# Patient Record
Sex: Male | Born: 1940
Health system: Southern US, Community
[De-identification: ages and names within clinical notes are randomized; demographics above are authoritative.]

## PROBLEM LIST (undated history)

## (undated) DIAGNOSIS — I4891 Unspecified atrial fibrillation: Secondary | ICD-10-CM

## (undated) DIAGNOSIS — I10 Essential (primary) hypertension: Secondary | ICD-10-CM

## (undated) DIAGNOSIS — K5909 Other constipation: Secondary | ICD-10-CM

## (undated) DIAGNOSIS — G4733 Obstructive sleep apnea (adult) (pediatric): Secondary | ICD-10-CM

## (undated) DIAGNOSIS — Z95810 Presence of automatic (implantable) cardiac defibrillator: Secondary | ICD-10-CM

## (undated) DIAGNOSIS — J310 Chronic rhinitis: Secondary | ICD-10-CM

## (undated) DIAGNOSIS — Z950 Presence of cardiac pacemaker: Secondary | ICD-10-CM

## (undated) DIAGNOSIS — K5730 Diverticulosis of large intestine without perforation or abscess without bleeding: Secondary | ICD-10-CM

## (undated) DIAGNOSIS — C44529 Squamous cell carcinoma of skin of other part of trunk: Secondary | ICD-10-CM

## (undated) DIAGNOSIS — R3129 Other microscopic hematuria: Secondary | ICD-10-CM

## (undated) DIAGNOSIS — I2510 Atherosclerotic heart disease of native coronary artery without angina pectoris: Secondary | ICD-10-CM

## (undated) DIAGNOSIS — N400 Enlarged prostate without lower urinary tract symptoms: Secondary | ICD-10-CM

## (undated) DIAGNOSIS — J449 Chronic obstructive pulmonary disease, unspecified: Secondary | ICD-10-CM

## (undated) DIAGNOSIS — K219 Gastro-esophageal reflux disease without esophagitis: Secondary | ICD-10-CM

## (undated) DIAGNOSIS — I509 Heart failure, unspecified: Secondary | ICD-10-CM

## (undated) DIAGNOSIS — K760 Fatty (change of) liver, not elsewhere classified: Secondary | ICD-10-CM

## (undated) DIAGNOSIS — I255 Ischemic cardiomyopathy: Secondary | ICD-10-CM

## (undated) DIAGNOSIS — I472 Ventricular tachycardia, unspecified: Secondary | ICD-10-CM

## (undated) DIAGNOSIS — H903 Sensorineural hearing loss, bilateral: Secondary | ICD-10-CM

## (undated) DIAGNOSIS — K8020 Calculus of gallbladder without cholecystitis without obstruction: Secondary | ICD-10-CM

## (undated) DIAGNOSIS — D126 Benign neoplasm of colon, unspecified: Secondary | ICD-10-CM

## (undated) DIAGNOSIS — N200 Calculus of kidney: Secondary | ICD-10-CM

## (undated) DIAGNOSIS — E785 Hyperlipidemia, unspecified: Secondary | ICD-10-CM

## (undated) HISTORY — PX: ATRIAL ABLATION SURGERY: SHX560

## (undated) HISTORY — DX: Chronic rhinitis: J31.0

## (undated) HISTORY — PX: CARDIAC CATHETERIZATION: SHX172

## (undated) HISTORY — DX: Fatty (change of) liver, not elsewhere classified: K76.0

## (undated) HISTORY — DX: Other microscopic hematuria: R31.29

## (undated) HISTORY — DX: Sensorineural hearing loss, bilateral: H90.3

## (undated) HISTORY — DX: Benign prostatic hyperplasia without lower urinary tract symptoms: N40.0

## (undated) HISTORY — DX: Hyperlipidemia, unspecified: E78.5

## (undated) HISTORY — DX: Ventricular tachycardia, unspecified: I47.20

## (undated) HISTORY — DX: Obstructive sleep apnea (adult) (pediatric): G47.33

## (undated) HISTORY — DX: Calculus of kidney: N20.0

## (undated) HISTORY — DX: Essential (primary) hypertension: I10

## (undated) HISTORY — PX: OTHER SURGICAL HISTORY: SHX169

## (undated) HISTORY — DX: Ischemic cardiomyopathy: I25.5

## (undated) HISTORY — DX: Gastro-esophageal reflux disease without esophagitis: K21.9

## (undated) HISTORY — PX: EXTRACORPOREAL SHOCK WAVE LITHOTRIPSY: SHX1557

## (undated) HISTORY — DX: Ventricular tachycardia: I47.2

## (undated) HISTORY — PX: HEMORRHOID BANDING: SHX5850

## (undated) HISTORY — DX: Diverticulosis of large intestine without perforation or abscess without bleeding: K57.30

## (undated) HISTORY — DX: Unspecified atrial fibrillation: I48.91

## (undated) HISTORY — DX: Benign neoplasm of colon, unspecified: D12.6

## (undated) HISTORY — DX: Atherosclerotic heart disease of native coronary artery without angina pectoris: I25.10

---

## 1973-01-14 HISTORY — PX: INGUINAL HERNIA REPAIR: SUR1180

## 1988-01-15 HISTORY — PX: BREAST SURGERY: SHX581

## 1997-06-07 ENCOUNTER — Ambulatory Visit (HOSPITAL_COMMUNITY): Admission: RE | Admit: 1997-06-07 | Discharge: 1997-06-07 | Payer: Self-pay | Admitting: Cardiology

## 1997-06-16 ENCOUNTER — Ambulatory Visit (HOSPITAL_COMMUNITY): Admission: RE | Admit: 1997-06-16 | Discharge: 1997-06-16 | Payer: Self-pay | Admitting: Cardiology

## 2000-01-31 ENCOUNTER — Ambulatory Visit (HOSPITAL_COMMUNITY): Admission: RE | Admit: 2000-01-31 | Discharge: 2000-01-31 | Payer: Self-pay | Admitting: Cardiology

## 2000-04-07 ENCOUNTER — Ambulatory Visit (HOSPITAL_COMMUNITY): Admission: RE | Admit: 2000-04-07 | Discharge: 2000-04-07 | Payer: Self-pay | Admitting: Cardiology

## 2000-04-07 ENCOUNTER — Encounter: Payer: Self-pay | Admitting: Cardiology

## 2000-06-03 ENCOUNTER — Ambulatory Visit (HOSPITAL_COMMUNITY): Admission: RE | Admit: 2000-06-03 | Discharge: 2000-06-03 | Payer: Self-pay | Admitting: Cardiology

## 2000-06-09 ENCOUNTER — Ambulatory Visit (HOSPITAL_COMMUNITY): Admission: RE | Admit: 2000-06-09 | Discharge: 2000-06-10 | Payer: Self-pay | Admitting: Internal Medicine

## 2000-06-10 ENCOUNTER — Encounter: Payer: Self-pay | Admitting: Internal Medicine

## 2000-09-13 ENCOUNTER — Emergency Department (HOSPITAL_COMMUNITY): Admission: EM | Admit: 2000-09-13 | Discharge: 2000-09-13 | Payer: Self-pay | Admitting: Emergency Medicine

## 2000-10-03 ENCOUNTER — Emergency Department (HOSPITAL_COMMUNITY): Admission: EM | Admit: 2000-10-03 | Discharge: 2000-10-03 | Payer: Self-pay

## 2000-12-12 ENCOUNTER — Ambulatory Visit (HOSPITAL_COMMUNITY): Admission: RE | Admit: 2000-12-12 | Discharge: 2000-12-12 | Payer: Self-pay | Admitting: Internal Medicine

## 2001-02-05 ENCOUNTER — Encounter (INDEPENDENT_AMBULATORY_CARE_PROVIDER_SITE_OTHER): Payer: Self-pay | Admitting: Specialist

## 2001-02-05 ENCOUNTER — Ambulatory Visit (HOSPITAL_COMMUNITY): Admission: RE | Admit: 2001-02-05 | Discharge: 2001-02-05 | Payer: Self-pay | Admitting: Gastroenterology

## 2001-08-03 ENCOUNTER — Encounter: Admission: RE | Admit: 2001-08-03 | Discharge: 2001-08-03 | Payer: Self-pay | Admitting: Cardiology

## 2001-08-03 ENCOUNTER — Encounter: Payer: Self-pay | Admitting: Cardiology

## 2003-01-15 DIAGNOSIS — K5730 Diverticulosis of large intestine without perforation or abscess without bleeding: Secondary | ICD-10-CM

## 2003-01-15 HISTORY — DX: Diverticulosis of large intestine without perforation or abscess without bleeding: K57.30

## 2003-06-01 ENCOUNTER — Encounter (INDEPENDENT_AMBULATORY_CARE_PROVIDER_SITE_OTHER): Payer: Self-pay | Admitting: Specialist

## 2003-06-01 ENCOUNTER — Ambulatory Visit (HOSPITAL_COMMUNITY): Admission: RE | Admit: 2003-06-01 | Discharge: 2003-06-01 | Payer: Self-pay | Admitting: Gastroenterology

## 2003-11-15 ENCOUNTER — Ambulatory Visit: Payer: Self-pay | Admitting: Cardiology

## 2003-11-16 ENCOUNTER — Ambulatory Visit: Payer: Self-pay | Admitting: Internal Medicine

## 2003-12-02 ENCOUNTER — Ambulatory Visit: Payer: Self-pay

## 2003-12-06 ENCOUNTER — Ambulatory Visit: Payer: Self-pay | Admitting: Internal Medicine

## 2003-12-15 ENCOUNTER — Ambulatory Visit: Payer: Self-pay | Admitting: Internal Medicine

## 2003-12-29 ENCOUNTER — Ambulatory Visit: Payer: Self-pay | Admitting: Cardiology

## 2004-01-02 ENCOUNTER — Ambulatory Visit: Payer: Self-pay

## 2004-01-18 ENCOUNTER — Ambulatory Visit: Payer: Self-pay | Admitting: Cardiology

## 2004-02-08 ENCOUNTER — Ambulatory Visit: Payer: Self-pay | Admitting: Cardiology

## 2004-02-23 ENCOUNTER — Ambulatory Visit: Payer: Self-pay | Admitting: Cardiology

## 2004-03-20 ENCOUNTER — Ambulatory Visit: Payer: Self-pay | Admitting: Cardiovascular Disease

## 2004-04-17 ENCOUNTER — Ambulatory Visit: Payer: Self-pay | Admitting: Cardiology

## 2004-05-03 ENCOUNTER — Ambulatory Visit: Payer: Self-pay

## 2004-05-03 ENCOUNTER — Ambulatory Visit: Payer: Self-pay | Admitting: Cardiology

## 2004-05-15 ENCOUNTER — Ambulatory Visit: Payer: Self-pay | Admitting: *Deleted

## 2004-06-12 ENCOUNTER — Ambulatory Visit: Payer: Self-pay | Admitting: *Deleted

## 2004-06-25 ENCOUNTER — Ambulatory Visit: Payer: Self-pay | Admitting: Internal Medicine

## 2004-07-02 ENCOUNTER — Inpatient Hospital Stay (HOSPITAL_COMMUNITY): Admission: EM | Admit: 2004-07-02 | Discharge: 2004-07-06 | Payer: Self-pay | Admitting: Emergency Medicine

## 2004-07-02 ENCOUNTER — Ambulatory Visit: Payer: Self-pay | Admitting: Internal Medicine

## 2004-07-09 ENCOUNTER — Ambulatory Visit: Payer: Self-pay | Admitting: Internal Medicine

## 2004-07-19 ENCOUNTER — Ambulatory Visit: Payer: Self-pay | Admitting: Internal Medicine

## 2004-08-07 ENCOUNTER — Ambulatory Visit: Payer: Self-pay | Admitting: Cardiology

## 2004-08-15 ENCOUNTER — Ambulatory Visit: Payer: Self-pay | Admitting: Cardiology

## 2004-08-17 ENCOUNTER — Ambulatory Visit: Payer: Self-pay | Admitting: Cardiology

## 2004-08-22 ENCOUNTER — Ambulatory Visit: Payer: Self-pay | Admitting: Cardiology

## 2004-08-30 ENCOUNTER — Ambulatory Visit: Payer: Self-pay | Admitting: Internal Medicine

## 2004-09-14 ENCOUNTER — Ambulatory Visit: Payer: Self-pay | Admitting: Cardiology

## 2004-10-01 ENCOUNTER — Ambulatory Visit: Payer: Self-pay | Admitting: Cardiology

## 2004-10-23 ENCOUNTER — Ambulatory Visit: Payer: Self-pay | Admitting: Cardiovascular Disease

## 2004-11-08 ENCOUNTER — Ambulatory Visit: Payer: Self-pay | Admitting: Cardiology

## 2004-11-29 ENCOUNTER — Ambulatory Visit: Payer: Self-pay | Admitting: Internal Medicine

## 2004-12-07 ENCOUNTER — Ambulatory Visit: Payer: Self-pay

## 2004-12-10 ENCOUNTER — Ambulatory Visit: Payer: Self-pay

## 2004-12-10 ENCOUNTER — Ambulatory Visit: Payer: Self-pay | Admitting: Cardiology

## 2004-12-27 ENCOUNTER — Ambulatory Visit: Payer: Self-pay | Admitting: *Deleted

## 2005-01-23 ENCOUNTER — Ambulatory Visit: Payer: Self-pay | Admitting: Cardiology

## 2005-02-05 ENCOUNTER — Ambulatory Visit: Payer: Self-pay | Admitting: *Deleted

## 2005-02-13 ENCOUNTER — Ambulatory Visit: Payer: Self-pay | Admitting: Cardiology

## 2005-02-27 ENCOUNTER — Ambulatory Visit: Payer: Self-pay | Admitting: Internal Medicine

## 2005-03-06 ENCOUNTER — Ambulatory Visit: Payer: Self-pay | Admitting: Cardiology

## 2005-04-01 ENCOUNTER — Ambulatory Visit: Payer: Self-pay | Admitting: Internal Medicine

## 2005-04-02 ENCOUNTER — Ambulatory Visit: Payer: Self-pay

## 2005-04-08 ENCOUNTER — Ambulatory Visit: Payer: Self-pay | Admitting: Internal Medicine

## 2005-04-09 ENCOUNTER — Ambulatory Visit: Payer: Self-pay | Admitting: Internal Medicine

## 2005-04-30 ENCOUNTER — Ambulatory Visit: Payer: Self-pay | Admitting: Cardiology

## 2005-05-07 ENCOUNTER — Ambulatory Visit: Payer: Self-pay | Admitting: Cardiovascular Disease

## 2005-05-10 ENCOUNTER — Ambulatory Visit: Payer: Self-pay | Admitting: Cardiology

## 2005-05-28 ENCOUNTER — Ambulatory Visit: Payer: Self-pay | Admitting: Cardiology

## 2005-06-07 ENCOUNTER — Ambulatory Visit: Payer: Self-pay | Admitting: Internal Medicine

## 2005-06-11 ENCOUNTER — Ambulatory Visit: Payer: Self-pay | Admitting: Cardiology

## 2005-07-02 ENCOUNTER — Ambulatory Visit (HOSPITAL_BASED_OUTPATIENT_CLINIC_OR_DEPARTMENT_OTHER): Admission: RE | Admit: 2005-07-02 | Discharge: 2005-07-02 | Payer: Self-pay | Admitting: Internal Medicine

## 2005-07-03 ENCOUNTER — Ambulatory Visit: Payer: Self-pay | Admitting: Cardiovascular Disease

## 2005-07-09 ENCOUNTER — Ambulatory Visit: Payer: Self-pay | Admitting: Internal Medicine

## 2005-07-18 ENCOUNTER — Ambulatory Visit: Payer: Self-pay | Admitting: Pulmonary Disease

## 2005-07-30 ENCOUNTER — Ambulatory Visit: Payer: Self-pay | Admitting: Pulmonary Disease

## 2005-07-31 ENCOUNTER — Ambulatory Visit: Payer: Self-pay | Admitting: *Deleted

## 2005-09-02 ENCOUNTER — Ambulatory Visit: Payer: Self-pay | Admitting: Internal Medicine

## 2005-09-30 ENCOUNTER — Ambulatory Visit: Payer: Self-pay | Admitting: Cardiovascular Disease

## 2005-09-30 ENCOUNTER — Ambulatory Visit: Payer: Self-pay | Admitting: Cardiology

## 2005-10-01 ENCOUNTER — Ambulatory Visit: Payer: Self-pay | Admitting: Pulmonary Disease

## 2005-10-09 ENCOUNTER — Ambulatory Visit: Payer: Self-pay | Admitting: Cardiology

## 2005-10-09 ENCOUNTER — Ambulatory Visit: Payer: Self-pay

## 2005-10-18 ENCOUNTER — Ambulatory Visit: Payer: Self-pay | Admitting: Cardiology

## 2005-10-18 ENCOUNTER — Ambulatory Visit: Payer: Self-pay | Admitting: Cardiovascular Disease

## 2005-10-29 ENCOUNTER — Ambulatory Visit: Payer: Self-pay

## 2005-11-13 ENCOUNTER — Ambulatory Visit: Payer: Self-pay | Admitting: Cardiology

## 2005-12-11 ENCOUNTER — Ambulatory Visit: Payer: Self-pay | Admitting: Cardiovascular Disease

## 2006-01-09 ENCOUNTER — Ambulatory Visit: Payer: Self-pay | Admitting: Cardiology

## 2006-01-27 ENCOUNTER — Ambulatory Visit: Payer: Self-pay | Admitting: Internal Medicine

## 2006-02-04 ENCOUNTER — Ambulatory Visit: Payer: Self-pay | Admitting: *Deleted

## 2006-02-13 ENCOUNTER — Ambulatory Visit: Payer: Self-pay | Admitting: Pulmonary Disease

## 2006-02-25 ENCOUNTER — Ambulatory Visit: Payer: Self-pay | Admitting: Cardiology

## 2006-03-12 ENCOUNTER — Ambulatory Visit: Payer: Self-pay | Admitting: Internal Medicine

## 2006-04-14 ENCOUNTER — Ambulatory Visit: Payer: Self-pay | Admitting: Cardiovascular Disease

## 2006-04-17 ENCOUNTER — Ambulatory Visit: Payer: Self-pay | Admitting: Cardiology

## 2006-04-17 LAB — CONVERTED CEMR LAB
ALT: 38 units/L (ref 0–40)
Bilirubin, Direct: 0.2 mg/dL (ref 0.0–0.3)
CO2: 28 meq/L (ref 19–32)
Calcium: 9.5 mg/dL (ref 8.4–10.5)
Cholesterol: 121 mg/dL (ref 0–200)
GFR calc Af Amer: 146 mL/min
GFR calc non Af Amer: 121 mL/min
Glucose, Bld: 85 mg/dL (ref 70–99)
LDL Cholesterol: 61 mg/dL (ref 0–99)
Potassium: 4.3 meq/L (ref 3.5–5.1)
Total CHOL/HDL Ratio: 3.7
Total Protein: 6.7 g/dL (ref 6.0–8.3)
Triglycerides: 137 mg/dL (ref 0–149)

## 2006-04-22 ENCOUNTER — Ambulatory Visit: Payer: Self-pay | Admitting: Internal Medicine

## 2006-05-12 ENCOUNTER — Ambulatory Visit: Payer: Self-pay | Admitting: Internal Medicine

## 2006-05-12 ENCOUNTER — Ambulatory Visit: Payer: Self-pay | Admitting: Cardiology

## 2006-05-12 LAB — CONVERTED CEMR LAB
Basophils Relative: 0.6 % (ref 0.0–1.0)
CO2: 27 meq/L (ref 19–32)
Calcium: 8.7 mg/dL (ref 8.4–10.5)
Chloride: 112 meq/L (ref 96–112)
Creatinine, Ser: 0.8 mg/dL (ref 0.4–1.5)
Eosinophils Absolute: 0.6 10*3/uL (ref 0.0–0.6)
Eosinophils Relative: 9 % — ABNORMAL HIGH (ref 0.0–5.0)
GFR calc non Af Amer: 103 mL/min
Glucose, Bld: 110 mg/dL — ABNORMAL HIGH (ref 70–99)
HCT: 41.8 % (ref 39.0–52.0)
INR: 2.5 — ABNORMAL HIGH (ref 0.9–2.0)
Neutrophils Relative %: 45.4 % (ref 43.0–77.0)
Platelets: 241 10*3/uL (ref 150–400)
Prothrombin Time: 19.8 s — ABNORMAL HIGH (ref 10.0–14.0)
RBC: 4.31 M/uL (ref 4.22–5.81)
RDW: 13 % (ref 11.5–14.6)
Sodium: 142 meq/L (ref 135–145)
WBC: 6.8 10*3/uL (ref 4.5–10.5)

## 2006-05-16 ENCOUNTER — Ambulatory Visit: Payer: Self-pay | Admitting: Cardiovascular Disease

## 2006-05-17 ENCOUNTER — Inpatient Hospital Stay (HOSPITAL_COMMUNITY): Admission: AD | Admit: 2006-05-17 | Discharge: 2006-05-20 | Payer: Self-pay | Admitting: Cardiovascular Disease

## 2006-05-22 ENCOUNTER — Ambulatory Visit: Payer: Self-pay | Admitting: Cardiology

## 2006-05-22 ENCOUNTER — Ambulatory Visit: Payer: Self-pay | Admitting: Internal Medicine

## 2006-05-30 ENCOUNTER — Ambulatory Visit: Payer: Self-pay | Admitting: Cardiology

## 2006-06-11 ENCOUNTER — Ambulatory Visit: Payer: Self-pay

## 2006-06-11 LAB — CONVERTED CEMR LAB
BUN: 15 mg/dL (ref 6–23)
CO2: 25 meq/L (ref 19–32)
Calcium: 9.3 mg/dL (ref 8.4–10.5)
Chloride: 107 meq/L (ref 96–112)
GFR calc Af Amer: 125 mL/min

## 2006-06-17 ENCOUNTER — Ambulatory Visit: Payer: Self-pay | Admitting: Internal Medicine

## 2006-06-17 ENCOUNTER — Ambulatory Visit: Payer: Self-pay | Admitting: Cardiology

## 2006-06-25 ENCOUNTER — Ambulatory Visit: Payer: Self-pay | Admitting: Cardiology

## 2006-06-26 ENCOUNTER — Ambulatory Visit: Admission: RE | Admit: 2006-06-26 | Discharge: 2006-06-26 | Payer: Self-pay | Admitting: Internal Medicine

## 2006-06-26 LAB — PULMONARY FUNCTION TEST

## 2006-07-04 ENCOUNTER — Ambulatory Visit: Payer: Self-pay | Admitting: Cardiology

## 2006-07-11 ENCOUNTER — Ambulatory Visit: Payer: Self-pay | Admitting: Internal Medicine

## 2006-07-21 ENCOUNTER — Ambulatory Visit: Payer: Self-pay | Admitting: Cardiovascular Disease

## 2006-07-21 ENCOUNTER — Ambulatory Visit: Payer: Self-pay | Admitting: Internal Medicine

## 2006-07-21 LAB — CONVERTED CEMR LAB
BUN: 11 mg/dL (ref 6–23)
Basophils Relative: 0.5 % (ref 0.0–1.0)
CO2: 32 meq/L (ref 19–32)
Calcium: 8.7 mg/dL (ref 8.4–10.5)
Creatinine, Ser: 1 mg/dL (ref 0.4–1.5)
Eosinophils Relative: 7.9 % — ABNORMAL HIGH (ref 0.0–5.0)
Hemoglobin: 13.8 g/dL (ref 13.0–17.0)
INR: 3.1 — ABNORMAL HIGH (ref 0.9–2.0)
Monocytes Relative: 10.4 % (ref 3.0–11.0)
Platelets: 269 10*3/uL (ref 150–400)
Prothrombin Time: 22.2 s — ABNORMAL HIGH (ref 10.0–14.0)
RDW: 13.6 % (ref 11.5–14.6)
WBC: 5.6 10*3/uL (ref 4.5–10.5)

## 2006-07-25 ENCOUNTER — Ambulatory Visit: Payer: Self-pay | Admitting: Internal Medicine

## 2006-07-25 ENCOUNTER — Ambulatory Visit (HOSPITAL_COMMUNITY): Admission: RE | Admit: 2006-07-25 | Discharge: 2006-07-25 | Payer: Self-pay | Admitting: Internal Medicine

## 2006-07-29 ENCOUNTER — Ambulatory Visit: Payer: Self-pay | Admitting: Cardiovascular Disease

## 2006-08-07 ENCOUNTER — Ambulatory Visit: Payer: Self-pay | Admitting: Cardiology

## 2006-08-12 ENCOUNTER — Ambulatory Visit: Payer: Self-pay | Admitting: Internal Medicine

## 2006-08-12 ENCOUNTER — Ambulatory Visit: Payer: Self-pay | Admitting: Cardiology

## 2006-08-12 LAB — CONVERTED CEMR LAB
ALT: 35 units/L (ref 0–53)
AST: 27 units/L (ref 0–37)
Alkaline Phosphatase: 58 units/L (ref 39–117)
BUN: 15 mg/dL (ref 6–23)
CO2: 27 meq/L (ref 19–32)
Calcium: 9.2 mg/dL (ref 8.4–10.5)
Chloride: 105 meq/L (ref 96–112)
Creatinine, Ser: 1.1 mg/dL (ref 0.4–1.5)
Total Bilirubin: 1.6 mg/dL — ABNORMAL HIGH (ref 0.3–1.2)
Total Protein: 6.2 g/dL (ref 6.0–8.3)

## 2006-09-01 ENCOUNTER — Ambulatory Visit: Payer: Self-pay | Admitting: Cardiology

## 2006-09-16 ENCOUNTER — Ambulatory Visit: Payer: Self-pay | Admitting: Cardiology

## 2006-10-08 ENCOUNTER — Ambulatory Visit: Payer: Self-pay | Admitting: Internal Medicine

## 2006-10-08 LAB — CONVERTED CEMR LAB
Alkaline Phosphatase: 55 units/L (ref 39–117)
Bilirubin, Direct: 0.2 mg/dL (ref 0.0–0.3)
TSH: 1.03 microintl units/mL (ref 0.35–5.50)
Total Bilirubin: 1.3 mg/dL — ABNORMAL HIGH (ref 0.3–1.2)
Total Protein: 6.6 g/dL (ref 6.0–8.3)

## 2006-10-14 ENCOUNTER — Ambulatory Visit: Payer: Self-pay | Admitting: Cardiology

## 2006-10-28 ENCOUNTER — Ambulatory Visit: Payer: Self-pay | Admitting: Cardiology

## 2006-11-06 ENCOUNTER — Ambulatory Visit: Payer: Self-pay | Admitting: Cardiology

## 2006-11-06 LAB — CONVERTED CEMR LAB
Basophils Absolute: 0 10*3/uL (ref 0.0–0.1)
Bilirubin, Direct: 0.1 mg/dL (ref 0.0–0.3)
CO2: 28 meq/L (ref 19–32)
Cholesterol: 127 mg/dL (ref 0–200)
Creatinine, Ser: 0.9 mg/dL (ref 0.4–1.5)
HCT: 40.3 % (ref 39.0–52.0)
Hemoglobin: 14.2 g/dL (ref 13.0–17.0)
LDL Cholesterol: 73 mg/dL (ref 0–99)
MCHC: 35.3 g/dL (ref 30.0–36.0)
Magnesium: 2.2 mg/dL (ref 1.5–2.5)
Monocytes Absolute: 0.5 10*3/uL (ref 0.2–0.7)
Neutrophils Relative %: 55 % (ref 43.0–77.0)
Potassium: 4.3 meq/L (ref 3.5–5.1)
RDW: 13.3 % (ref 11.5–14.6)
Sodium: 141 meq/L (ref 135–145)
Total Bilirubin: 1.2 mg/dL (ref 0.3–1.2)
Total Protein: 6.4 g/dL (ref 6.0–8.3)
aPTT: 36.6 s — ABNORMAL HIGH (ref 21.7–29.8)

## 2006-11-07 ENCOUNTER — Ambulatory Visit (HOSPITAL_COMMUNITY): Admission: RE | Admit: 2006-11-07 | Discharge: 2006-11-07 | Payer: Self-pay | Admitting: Cardiology

## 2006-11-10 ENCOUNTER — Inpatient Hospital Stay (HOSPITAL_BASED_OUTPATIENT_CLINIC_OR_DEPARTMENT_OTHER): Admission: RE | Admit: 2006-11-10 | Discharge: 2006-11-10 | Payer: Self-pay | Admitting: Cardiology

## 2006-11-10 ENCOUNTER — Ambulatory Visit: Payer: Self-pay | Admitting: Cardiology

## 2006-11-14 ENCOUNTER — Ambulatory Visit: Payer: Self-pay | Admitting: Cardiology

## 2006-11-21 ENCOUNTER — Ambulatory Visit: Payer: Self-pay | Admitting: Cardiology

## 2006-12-04 ENCOUNTER — Ambulatory Visit: Payer: Self-pay | Admitting: Cardiology

## 2006-12-04 ENCOUNTER — Ambulatory Visit: Payer: Self-pay | Admitting: Internal Medicine

## 2006-12-19 ENCOUNTER — Ambulatory Visit: Payer: Self-pay | Admitting: Pulmonary Disease

## 2006-12-25 ENCOUNTER — Ambulatory Visit: Payer: Self-pay | Admitting: Cardiology

## 2007-01-07 ENCOUNTER — Ambulatory Visit: Payer: Self-pay | Admitting: Internal Medicine

## 2007-01-28 ENCOUNTER — Ambulatory Visit: Payer: Self-pay | Admitting: Cardiovascular Disease

## 2007-02-04 ENCOUNTER — Ambulatory Visit: Payer: Self-pay | Admitting: Internal Medicine

## 2007-02-25 ENCOUNTER — Ambulatory Visit: Payer: Self-pay | Admitting: Cardiology

## 2007-03-02 ENCOUNTER — Ambulatory Visit: Payer: Self-pay | Admitting: Pulmonary Disease

## 2007-03-02 DIAGNOSIS — G4733 Obstructive sleep apnea (adult) (pediatric): Secondary | ICD-10-CM | POA: Insufficient documentation

## 2007-03-03 ENCOUNTER — Telehealth (INDEPENDENT_AMBULATORY_CARE_PROVIDER_SITE_OTHER): Payer: Self-pay | Admitting: *Deleted

## 2007-03-04 DIAGNOSIS — J310 Chronic rhinitis: Secondary | ICD-10-CM | POA: Insufficient documentation

## 2007-03-19 ENCOUNTER — Ambulatory Visit: Payer: Self-pay | Admitting: Cardiology

## 2007-03-19 LAB — CONVERTED CEMR LAB
AST: 167 units/L — ABNORMAL HIGH (ref 0–37)
Albumin: 3.7 g/dL (ref 3.5–5.2)
Alkaline Phosphatase: 79 units/L (ref 39–117)
BUN: 13 mg/dL (ref 6–23)
Chloride: 110 meq/L (ref 96–112)
Creatinine, Ser: 1 mg/dL (ref 0.4–1.5)
Total Bilirubin: 0.8 mg/dL (ref 0.3–1.2)
VLDL: 15 mg/dL (ref 0–40)

## 2007-03-25 ENCOUNTER — Ambulatory Visit: Payer: Self-pay | Admitting: Cardiology

## 2007-03-25 ENCOUNTER — Telehealth: Payer: Self-pay | Admitting: Pulmonary Disease

## 2007-04-22 ENCOUNTER — Ambulatory Visit: Payer: Self-pay | Admitting: Cardiovascular Disease

## 2007-05-01 ENCOUNTER — Ambulatory Visit: Payer: Self-pay | Admitting: Cardiology

## 2007-05-01 LAB — CONVERTED CEMR LAB
ALT: 141 units/L — ABNORMAL HIGH (ref 0–53)
AST: 80 units/L — ABNORMAL HIGH (ref 0–37)
Albumin: 3.4 g/dL — ABNORMAL LOW (ref 3.5–5.2)
Alkaline Phosphatase: 67 units/L (ref 39–117)
Total Bilirubin: 1 mg/dL (ref 0.3–1.2)

## 2007-05-18 ENCOUNTER — Ambulatory Visit: Payer: Self-pay | Admitting: Cardiology

## 2007-05-18 ENCOUNTER — Ambulatory Visit: Payer: Self-pay

## 2007-05-18 LAB — CONVERTED CEMR LAB
AST: 48 units/L — ABNORMAL HIGH (ref 0–37)
Albumin: 3.4 g/dL — ABNORMAL LOW (ref 3.5–5.2)
Alkaline Phosphatase: 58 units/L (ref 39–117)
Bilirubin, Direct: 0.1 mg/dL (ref 0.0–0.3)
Cholesterol: 228 mg/dL (ref 0–200)
Total CHOL/HDL Ratio: 6.8

## 2007-05-20 ENCOUNTER — Ambulatory Visit: Payer: Self-pay | Admitting: Internal Medicine

## 2007-05-25 ENCOUNTER — Ambulatory Visit: Payer: Self-pay | Admitting: Internal Medicine

## 2007-06-02 ENCOUNTER — Ambulatory Visit: Payer: Self-pay | Admitting: Cardiology

## 2007-07-07 ENCOUNTER — Ambulatory Visit: Payer: Self-pay | Admitting: Cardiology

## 2007-07-07 ENCOUNTER — Ambulatory Visit: Payer: Self-pay | Admitting: Cardiovascular Disease

## 2007-07-07 LAB — CONVERTED CEMR LAB
ALT: 71 units/L — ABNORMAL HIGH (ref 0–53)
Albumin: 3.6 g/dL (ref 3.5–5.2)
Alkaline Phosphatase: 58 units/L (ref 39–117)
Total Protein: 6.7 g/dL (ref 6.0–8.3)

## 2007-07-16 ENCOUNTER — Ambulatory Visit: Payer: Self-pay | Admitting: Internal Medicine

## 2007-07-16 ENCOUNTER — Ambulatory Visit: Payer: Self-pay | Admitting: Cardiology

## 2007-07-30 ENCOUNTER — Ambulatory Visit: Payer: Self-pay | Admitting: Cardiovascular Disease

## 2007-08-14 ENCOUNTER — Ambulatory Visit: Payer: Self-pay | Admitting: Cardiology

## 2007-08-31 ENCOUNTER — Ambulatory Visit: Payer: Self-pay | Admitting: Cardiology

## 2007-08-31 ENCOUNTER — Ambulatory Visit: Payer: Self-pay | Admitting: Internal Medicine

## 2007-08-31 LAB — CONVERTED CEMR LAB
Albumin: 3.5 g/dL (ref 3.5–5.2)
Alkaline Phosphatase: 57 units/L (ref 39–117)
Total Protein: 6.6 g/dL (ref 6.0–8.3)

## 2007-09-16 ENCOUNTER — Ambulatory Visit: Payer: Self-pay | Admitting: Cardiology

## 2007-09-16 ENCOUNTER — Ambulatory Visit: Payer: Self-pay | Admitting: Internal Medicine

## 2007-10-13 ENCOUNTER — Ambulatory Visit: Payer: Self-pay | Admitting: Cardiology

## 2007-10-13 LAB — CONVERTED CEMR LAB
ALT: 74 units/L — ABNORMAL HIGH (ref 0–53)
Albumin: 3.5 g/dL (ref 3.5–5.2)
BUN: 17 mg/dL (ref 6–23)
CO2: 27 meq/L (ref 19–32)
Calcium: 8.8 mg/dL (ref 8.4–10.5)
Cholesterol: 149 mg/dL (ref 0–200)
Creatinine, Ser: 1 mg/dL (ref 0.4–1.5)
GFR calc Af Amer: 96 mL/min
Glucose, Bld: 96 mg/dL (ref 70–99)
HDL: 35.2 mg/dL — ABNORMAL LOW (ref >39.0)
Sodium: 140 meq/L (ref 135–145)
Total Protein: 6.1 g/dL (ref 6.0–8.3)
Triglycerides: 103 mg/dL (ref 0–149)
VLDL: 21 mg/dL (ref 0–40)

## 2007-11-03 ENCOUNTER — Ambulatory Visit: Payer: Self-pay | Admitting: Internal Medicine

## 2007-11-12 ENCOUNTER — Ambulatory Visit: Payer: Self-pay | Admitting: Cardiology

## 2007-11-12 LAB — CONVERTED CEMR LAB
ALT: 42 units/L (ref 0–53)
Bilirubin, Direct: 0.1 mg/dL (ref 0.0–0.3)
Total Bilirubin: 0.9 mg/dL (ref 0.3–1.2)

## 2007-11-26 ENCOUNTER — Ambulatory Visit: Payer: Self-pay | Admitting: Cardiology

## 2007-12-01 ENCOUNTER — Encounter: Admission: RE | Admit: 2007-12-01 | Discharge: 2007-12-01 | Payer: Self-pay | Admitting: Family Medicine

## 2007-12-04 ENCOUNTER — Ambulatory Visit: Payer: Self-pay | Admitting: Cardiovascular Disease

## 2007-12-11 ENCOUNTER — Ambulatory Visit: Payer: Self-pay | Admitting: Internal Medicine

## 2007-12-14 ENCOUNTER — Ambulatory Visit: Payer: Self-pay | Admitting: Cardiology

## 2007-12-14 LAB — CONVERTED CEMR LAB
ALT: 60 units/L — ABNORMAL HIGH (ref 0–53)
Alkaline Phosphatase: 56 units/L (ref 39–117)
Bilirubin, Direct: 0.1 mg/dL (ref 0.0–0.3)
Total Bilirubin: 1 mg/dL (ref 0.3–1.2)
Total Protein: 6.6 g/dL (ref 6.0–8.3)

## 2008-01-04 ENCOUNTER — Ambulatory Visit: Payer: Self-pay | Admitting: Pulmonary Disease

## 2008-01-04 ENCOUNTER — Ambulatory Visit: Payer: Self-pay | Admitting: Cardiology

## 2008-01-04 DIAGNOSIS — I10 Essential (primary) hypertension: Secondary | ICD-10-CM | POA: Insufficient documentation

## 2008-01-04 DIAGNOSIS — R05 Cough: Secondary | ICD-10-CM

## 2008-01-04 DIAGNOSIS — E785 Hyperlipidemia, unspecified: Secondary | ICD-10-CM | POA: Insufficient documentation

## 2008-01-04 DIAGNOSIS — R059 Cough, unspecified: Secondary | ICD-10-CM | POA: Insufficient documentation

## 2008-01-12 ENCOUNTER — Ambulatory Visit: Payer: Self-pay | Admitting: Cardiovascular Disease

## 2008-01-20 ENCOUNTER — Ambulatory Visit: Payer: Self-pay | Admitting: Pulmonary Disease

## 2008-01-22 ENCOUNTER — Ambulatory Visit: Payer: Self-pay | Admitting: Cardiology

## 2008-02-12 ENCOUNTER — Ambulatory Visit: Payer: Self-pay | Admitting: Cardiovascular Disease

## 2008-02-26 ENCOUNTER — Ambulatory Visit: Payer: Self-pay | Admitting: Internal Medicine

## 2008-03-10 ENCOUNTER — Ambulatory Visit: Payer: Self-pay | Admitting: Cardiology

## 2008-03-10 LAB — CONVERTED CEMR LAB
ALT: 42 units/L (ref 0–53)
Albumin: 3.6 g/dL (ref 3.5–5.2)
Alkaline Phosphatase: 57 units/L (ref 39–117)
Bilirubin, Direct: 0.1 mg/dL (ref 0.0–0.3)
Total Protein: 6.5 g/dL (ref 6.0–8.3)

## 2008-03-14 ENCOUNTER — Ambulatory Visit: Payer: Self-pay | Admitting: Cardiology

## 2008-03-24 ENCOUNTER — Encounter: Payer: Self-pay | Admitting: Internal Medicine

## 2008-04-01 ENCOUNTER — Ambulatory Visit: Payer: Self-pay | Admitting: Cardiology

## 2008-04-21 ENCOUNTER — Ambulatory Visit: Payer: Self-pay | Admitting: Internal Medicine

## 2008-05-19 ENCOUNTER — Ambulatory Visit: Payer: Self-pay | Admitting: Internal Medicine

## 2008-05-19 ENCOUNTER — Ambulatory Visit: Payer: Self-pay

## 2008-06-09 ENCOUNTER — Ambulatory Visit: Payer: Self-pay | Admitting: Cardiovascular Disease

## 2008-06-09 LAB — CONVERTED CEMR LAB
POC INR: 2.1
Protime: 18

## 2008-06-14 ENCOUNTER — Encounter: Payer: Self-pay | Admitting: *Deleted

## 2008-06-22 ENCOUNTER — Ambulatory Visit: Payer: Self-pay | Admitting: Cardiology

## 2008-06-28 LAB — CONVERTED CEMR LAB
ALT: 50 units/L (ref 0–53)
BUN: 15 mg/dL (ref 6–23)
CO2: 27 meq/L (ref 19–32)
Calcium: 8.5 mg/dL (ref 8.4–10.5)
Chloride: 112 meq/L (ref 96–112)
Cholesterol: 156 mg/dL (ref 0–200)
Creatinine, Ser: 1 mg/dL (ref 0.4–1.5)
TSH: 1.32 microintl units/mL (ref 0.35–5.50)
Total Protein: 6.5 g/dL (ref 6.0–8.3)
Triglycerides: 78 mg/dL (ref 0.0–149.0)

## 2008-06-29 ENCOUNTER — Ambulatory Visit: Payer: Self-pay | Admitting: Cardiology

## 2008-06-29 ENCOUNTER — Encounter: Payer: Self-pay | Admitting: Cardiology

## 2008-06-29 DIAGNOSIS — R079 Chest pain, unspecified: Secondary | ICD-10-CM | POA: Insufficient documentation

## 2008-06-29 DIAGNOSIS — I4729 Other ventricular tachycardia: Secondary | ICD-10-CM | POA: Insufficient documentation

## 2008-06-29 DIAGNOSIS — I472 Ventricular tachycardia: Secondary | ICD-10-CM

## 2008-07-06 ENCOUNTER — Telehealth (INDEPENDENT_AMBULATORY_CARE_PROVIDER_SITE_OTHER): Payer: Self-pay | Admitting: *Deleted

## 2008-07-07 ENCOUNTER — Telehealth: Payer: Self-pay | Admitting: Internal Medicine

## 2008-07-07 ENCOUNTER — Encounter: Payer: Self-pay | Admitting: Cardiology

## 2008-07-07 ENCOUNTER — Ambulatory Visit: Payer: Self-pay

## 2008-07-07 ENCOUNTER — Ambulatory Visit: Payer: Self-pay | Admitting: Internal Medicine

## 2008-07-07 DIAGNOSIS — I2589 Other forms of chronic ischemic heart disease: Secondary | ICD-10-CM

## 2008-07-07 DIAGNOSIS — I255 Ischemic cardiomyopathy: Secondary | ICD-10-CM | POA: Insufficient documentation

## 2008-07-07 LAB — CONVERTED CEMR LAB
BUN: 13 mg/dL (ref 6–23)
CO2: 29 meq/L (ref 19–32)
Chloride: 106 meq/L (ref 96–112)
Creatinine, Ser: 1 mg/dL (ref 0.4–1.5)
Glucose, Bld: 105 mg/dL — ABNORMAL HIGH (ref 70–99)
Potassium: 4.4 meq/L (ref 3.5–5.1)

## 2008-07-20 ENCOUNTER — Encounter: Payer: Self-pay | Admitting: *Deleted

## 2008-08-01 ENCOUNTER — Encounter (INDEPENDENT_AMBULATORY_CARE_PROVIDER_SITE_OTHER): Payer: Self-pay | Admitting: *Deleted

## 2008-08-16 ENCOUNTER — Ambulatory Visit: Payer: Self-pay | Admitting: Cardiology

## 2008-08-17 LAB — CONVERTED CEMR LAB
ALT: 41 units/L (ref 0–53)
AST: 38 units/L — ABNORMAL HIGH (ref 0–37)
Albumin: 3.7 g/dL (ref 3.5–5.2)
Total Protein: 6.7 g/dL (ref 6.0–8.3)

## 2008-08-23 ENCOUNTER — Encounter: Payer: Self-pay | Admitting: Cardiology

## 2008-08-30 ENCOUNTER — Encounter (INDEPENDENT_AMBULATORY_CARE_PROVIDER_SITE_OTHER): Payer: Self-pay | Admitting: *Deleted

## 2008-09-07 ENCOUNTER — Ambulatory Visit: Payer: Self-pay | Admitting: Internal Medicine

## 2008-09-07 LAB — CONVERTED CEMR LAB: POC INR: 2.7

## 2008-09-27 ENCOUNTER — Ambulatory Visit: Payer: Self-pay | Admitting: Internal Medicine

## 2008-10-05 ENCOUNTER — Ambulatory Visit: Payer: Self-pay | Admitting: Internal Medicine

## 2008-10-05 LAB — CONVERTED CEMR LAB: POC INR: 2.9

## 2008-11-02 ENCOUNTER — Ambulatory Visit: Payer: Self-pay | Admitting: Internal Medicine

## 2008-11-02 LAB — CONVERTED CEMR LAB: POC INR: 2.5

## 2008-11-11 ENCOUNTER — Ambulatory Visit: Payer: Self-pay | Admitting: Cardiology

## 2008-11-11 ENCOUNTER — Telehealth: Payer: Self-pay | Admitting: Cardiology

## 2008-11-14 LAB — CONVERTED CEMR LAB
BUN: 17 mg/dL (ref 6–23)
Basophils Relative: 1 % (ref 0.0–3.0)
Bilirubin, Direct: 0.1 mg/dL (ref 0.0–0.3)
Chloride: 106 meq/L (ref 96–112)
Eosinophils Relative: 8.6 % — ABNORMAL HIGH (ref 0.0–5.0)
HCT: 43.4 % (ref 39.0–52.0)
LDL Cholesterol: 111 mg/dL — ABNORMAL HIGH (ref 0–99)
Lymphs Abs: 2.2 10*3/uL (ref 0.7–4.0)
MCV: 103.9 fL — ABNORMAL HIGH (ref 78.0–100.0)
Monocytes Absolute: 0.6 10*3/uL (ref 0.1–1.0)
Neutro Abs: 3.5 10*3/uL (ref 1.4–7.7)
Potassium: 3.9 meq/L (ref 3.5–5.1)
RBC: 4.17 M/uL — ABNORMAL LOW (ref 4.22–5.81)
Total Bilirubin: 0.9 mg/dL (ref 0.3–1.2)
Total CHOL/HDL Ratio: 5
Total Protein: 6.5 g/dL (ref 6.0–8.3)
WBC: 7 10*3/uL (ref 4.5–10.5)

## 2008-11-30 ENCOUNTER — Ambulatory Visit: Payer: Self-pay | Admitting: Cardiovascular Disease

## 2008-12-02 ENCOUNTER — Telehealth (INDEPENDENT_AMBULATORY_CARE_PROVIDER_SITE_OTHER): Payer: Self-pay | Admitting: *Deleted

## 2008-12-07 ENCOUNTER — Ambulatory Visit: Payer: Self-pay | Admitting: Internal Medicine

## 2008-12-07 LAB — CONVERTED CEMR LAB: POC INR: 2.4

## 2008-12-21 ENCOUNTER — Encounter: Payer: Self-pay | Admitting: Cardiology

## 2008-12-28 ENCOUNTER — Ambulatory Visit: Payer: Self-pay | Admitting: Cardiology

## 2008-12-28 ENCOUNTER — Ambulatory Visit: Payer: Self-pay | Admitting: Internal Medicine

## 2008-12-28 LAB — CONVERTED CEMR LAB: POC INR: 3.1

## 2008-12-29 ENCOUNTER — Ambulatory Visit: Payer: Self-pay | Admitting: Internal Medicine

## 2008-12-29 LAB — CONVERTED CEMR LAB
ALT: 46 units/L (ref 0–53)
AST: 42 units/L — ABNORMAL HIGH (ref 0–37)
Alkaline Phosphatase: 44 units/L (ref 39–117)
HDL: 37.2 mg/dL — ABNORMAL LOW (ref >39.00)
Total Bilirubin: 1 mg/dL (ref 0.3–1.2)
Triglycerides: 102 mg/dL (ref 0.0–149.0)

## 2009-01-16 ENCOUNTER — Encounter: Payer: Self-pay | Admitting: Internal Medicine

## 2009-01-18 ENCOUNTER — Ambulatory Visit: Payer: Self-pay | Admitting: Pulmonary Disease

## 2009-01-25 ENCOUNTER — Ambulatory Visit: Payer: Self-pay | Admitting: Internal Medicine

## 2009-01-25 LAB — CONVERTED CEMR LAB: POC INR: 1.9

## 2009-02-07 ENCOUNTER — Telehealth: Payer: Self-pay | Admitting: Cardiology

## 2009-02-15 ENCOUNTER — Ambulatory Visit: Payer: Self-pay | Admitting: Internal Medicine

## 2009-02-15 ENCOUNTER — Encounter (INDEPENDENT_AMBULATORY_CARE_PROVIDER_SITE_OTHER): Payer: Self-pay | Admitting: Cardiology

## 2009-02-21 ENCOUNTER — Ambulatory Visit: Payer: Self-pay | Admitting: Cardiology

## 2009-02-22 ENCOUNTER — Encounter: Payer: Self-pay | Admitting: Cardiology

## 2009-02-23 ENCOUNTER — Telehealth (INDEPENDENT_AMBULATORY_CARE_PROVIDER_SITE_OTHER): Payer: Self-pay | Admitting: *Deleted

## 2009-02-23 LAB — CONVERTED CEMR LAB
ALT: 36 units/L (ref 0–53)
Albumin: 3.7 g/dL (ref 3.5–5.2)
Alkaline Phosphatase: 47 units/L (ref 39–117)
Total Protein: 6.3 g/dL (ref 6.0–8.3)

## 2009-03-01 ENCOUNTER — Telehealth (INDEPENDENT_AMBULATORY_CARE_PROVIDER_SITE_OTHER): Payer: Self-pay | Admitting: *Deleted

## 2009-03-01 ENCOUNTER — Ambulatory Visit: Payer: Self-pay | Admitting: Internal Medicine

## 2009-03-01 LAB — CONVERTED CEMR LAB: POC INR: 1.7

## 2009-03-14 ENCOUNTER — Ambulatory Visit: Payer: Self-pay | Admitting: Cardiovascular Disease

## 2009-03-14 ENCOUNTER — Ambulatory Visit: Payer: Self-pay | Admitting: Internal Medicine

## 2009-03-30 ENCOUNTER — Ambulatory Visit: Payer: Self-pay | Admitting: Internal Medicine

## 2009-03-30 LAB — CONVERTED CEMR LAB: POC INR: 2.7

## 2009-04-14 ENCOUNTER — Encounter: Admission: RE | Admit: 2009-04-14 | Discharge: 2009-04-14 | Payer: Self-pay | Admitting: Gastroenterology

## 2009-05-01 ENCOUNTER — Ambulatory Visit: Payer: Self-pay | Admitting: Cardiology

## 2009-05-17 ENCOUNTER — Ambulatory Visit: Payer: Self-pay | Admitting: Cardiology

## 2009-05-26 ENCOUNTER — Ambulatory Visit: Payer: Self-pay | Admitting: Internal Medicine

## 2009-05-26 ENCOUNTER — Ambulatory Visit: Payer: Self-pay | Admitting: Cardiology

## 2009-05-26 DIAGNOSIS — I5042 Chronic combined systolic (congestive) and diastolic (congestive) heart failure: Secondary | ICD-10-CM | POA: Insufficient documentation

## 2009-05-26 DIAGNOSIS — I5022 Chronic systolic (congestive) heart failure: Secondary | ICD-10-CM

## 2009-05-26 LAB — CONVERTED CEMR LAB: POC INR: 5.2

## 2009-05-29 ENCOUNTER — Ambulatory Visit: Payer: Self-pay | Admitting: Internal Medicine

## 2009-05-29 LAB — CONVERTED CEMR LAB
CO2: 28 meq/L (ref 19–32)
Calcium: 9 mg/dL (ref 8.4–10.5)
Chloride: 107 meq/L (ref 96–112)
Creatinine, Ser: 1 mg/dL (ref 0.4–1.5)
Glucose, Bld: 104 mg/dL — ABNORMAL HIGH (ref 70–99)

## 2009-06-15 ENCOUNTER — Ambulatory Visit: Payer: Self-pay | Admitting: Internal Medicine

## 2009-06-16 ENCOUNTER — Encounter (INDEPENDENT_AMBULATORY_CARE_PROVIDER_SITE_OTHER): Payer: Self-pay | Admitting: *Deleted

## 2009-06-22 ENCOUNTER — Encounter: Payer: Self-pay | Admitting: Pulmonary Disease

## 2009-06-28 ENCOUNTER — Encounter: Payer: Self-pay | Admitting: Cardiology

## 2009-06-29 ENCOUNTER — Ambulatory Visit: Payer: Self-pay | Admitting: Cardiology

## 2009-06-29 ENCOUNTER — Ambulatory Visit: Payer: Self-pay | Admitting: Internal Medicine

## 2009-06-29 DIAGNOSIS — I4891 Unspecified atrial fibrillation: Secondary | ICD-10-CM | POA: Insufficient documentation

## 2009-06-29 LAB — CONVERTED CEMR LAB: POC INR: 3.5

## 2009-07-10 ENCOUNTER — Telehealth: Payer: Self-pay | Admitting: Internal Medicine

## 2009-07-11 ENCOUNTER — Ambulatory Visit: Payer: Self-pay | Admitting: Cardiology

## 2009-07-11 ENCOUNTER — Ambulatory Visit: Payer: Self-pay | Admitting: Internal Medicine

## 2009-07-11 LAB — CONVERTED CEMR LAB: POC INR: 2.1

## 2009-07-12 ENCOUNTER — Telehealth (INDEPENDENT_AMBULATORY_CARE_PROVIDER_SITE_OTHER): Payer: Self-pay | Admitting: *Deleted

## 2009-07-12 LAB — CONVERTED CEMR LAB
AST: 65 units/L — ABNORMAL HIGH (ref 0–37)
Albumin: 3.9 g/dL (ref 3.5–5.2)
Alkaline Phosphatase: 54 units/L (ref 39–117)
Bilirubin, Direct: 0.2 mg/dL (ref 0.0–0.3)

## 2009-07-25 ENCOUNTER — Ambulatory Visit: Payer: Self-pay | Admitting: Cardiovascular Disease

## 2009-08-10 ENCOUNTER — Ambulatory Visit: Payer: Self-pay | Admitting: Internal Medicine

## 2009-08-23 ENCOUNTER — Ambulatory Visit: Payer: Self-pay | Admitting: Cardiology

## 2009-08-23 LAB — CONVERTED CEMR LAB: POC INR: 2.8

## 2009-08-25 LAB — CONVERTED CEMR LAB
ALT: 42 units/L (ref 0–53)
AST: 39 units/L — ABNORMAL HIGH (ref 0–37)
Total Bilirubin: 0.8 mg/dL (ref 0.3–1.2)
Total Protein: 6.1 g/dL (ref 6.0–8.3)

## 2009-09-15 ENCOUNTER — Ambulatory Visit: Payer: Self-pay | Admitting: Cardiology

## 2009-09-15 LAB — CONVERTED CEMR LAB: POC INR: 1.9

## 2009-09-29 ENCOUNTER — Ambulatory Visit: Payer: Self-pay | Admitting: Cardiology

## 2009-10-03 ENCOUNTER — Ambulatory Visit: Payer: Self-pay | Admitting: Internal Medicine

## 2009-10-25 ENCOUNTER — Ambulatory Visit: Payer: Self-pay | Admitting: Cardiology

## 2009-11-22 ENCOUNTER — Ambulatory Visit: Payer: Self-pay | Admitting: Cardiovascular Disease

## 2009-11-28 ENCOUNTER — Encounter: Payer: Self-pay | Admitting: Cardiology

## 2009-11-28 ENCOUNTER — Ambulatory Visit: Payer: Self-pay | Admitting: Cardiology

## 2009-11-28 DIAGNOSIS — I739 Peripheral vascular disease, unspecified: Secondary | ICD-10-CM | POA: Insufficient documentation

## 2009-11-30 ENCOUNTER — Ambulatory Visit: Payer: Self-pay | Admitting: Cardiology

## 2009-11-30 LAB — CONVERTED CEMR LAB
ALT: 53 U/L
AST: 46 U/L — ABNORMAL HIGH
Albumin: 3.8 g/dL
Alkaline Phosphatase: 55 U/L
BUN: 21 mg/dL
Basophils Absolute: 0 K/uL
Basophils Relative: 0.6 %
Bilirubin, Direct: 0.2 mg/dL
CO2: 26 meq/L
Calcium: 8.8 mg/dL
Chloride: 105 meq/L
Cholesterol: 121 mg/dL
Creatinine, Ser: 1.1 mg/dL
Eosinophils Absolute: 0.4 K/uL
Eosinophils Relative: 4.9 %
GFR calc non Af Amer: 71.46 mL/min
Glucose, Bld: 105 mg/dL — ABNORMAL HIGH
HCT: 41.7 %
HDL: 30.7 mg/dL — ABNORMAL LOW
Hemoglobin: 14.1 g/dL
LDL Cholesterol: 74 mg/dL
Lymphocytes Relative: 26.1 %
Lymphs Abs: 2.1 K/uL
MCHC: 33.7 g/dL
MCV: 103.7 fL — ABNORMAL HIGH
Monocytes Absolute: 0.5 K/uL
Monocytes Relative: 6.8 %
Neutro Abs: 5 K/uL
Neutrophils Relative %: 61.6 %
Platelets: 248 K/uL
Potassium: 4.2 meq/L
RBC: 4.02 M/uL — ABNORMAL LOW
RDW: 14.3 %
Sodium: 139 meq/L
TSH: 1.8 u[IU]/mL
Total Bilirubin: 1.1 mg/dL
Total CHOL/HDL Ratio: 4
Total Protein: 6.1 g/dL
Triglycerides: 80 mg/dL
VLDL: 16 mg/dL
WBC: 8.1 10*3/microliter

## 2009-12-04 ENCOUNTER — Encounter: Payer: Self-pay | Admitting: Cardiology

## 2009-12-05 ENCOUNTER — Encounter: Payer: Self-pay | Admitting: Family Medicine

## 2009-12-10 ENCOUNTER — Inpatient Hospital Stay (HOSPITAL_COMMUNITY): Admission: EM | Admit: 2009-12-10 | Discharge: 2009-12-13 | Payer: Self-pay | Admitting: Emergency Medicine

## 2009-12-10 ENCOUNTER — Encounter: Payer: Self-pay | Admitting: *Deleted

## 2009-12-10 ENCOUNTER — Ambulatory Visit: Payer: Self-pay | Admitting: Pulmonary Disease

## 2009-12-10 ENCOUNTER — Encounter: Payer: Self-pay | Admitting: Cardiology

## 2009-12-10 ENCOUNTER — Ambulatory Visit: Payer: Self-pay | Admitting: Cardiology

## 2009-12-11 ENCOUNTER — Encounter: Payer: Self-pay | Admitting: Cardiology

## 2009-12-13 ENCOUNTER — Encounter: Payer: Self-pay | Admitting: Cardiology

## 2009-12-13 ENCOUNTER — Telehealth: Payer: Self-pay | Admitting: Cardiology

## 2009-12-18 ENCOUNTER — Telehealth (INDEPENDENT_AMBULATORY_CARE_PROVIDER_SITE_OTHER): Payer: Self-pay | Admitting: *Deleted

## 2009-12-19 ENCOUNTER — Encounter: Payer: Self-pay | Admitting: *Deleted

## 2009-12-19 ENCOUNTER — Ambulatory Visit: Payer: Self-pay | Admitting: Cardiovascular Disease

## 2009-12-19 ENCOUNTER — Ambulatory Visit: Payer: Self-pay

## 2009-12-19 ENCOUNTER — Encounter (HOSPITAL_COMMUNITY)
Admission: RE | Admit: 2009-12-19 | Discharge: 2010-02-13 | Payer: Self-pay | Source: Home / Self Care | Attending: Cardiology | Admitting: Cardiology

## 2009-12-19 ENCOUNTER — Encounter: Payer: Self-pay | Admitting: Cardiology

## 2009-12-20 ENCOUNTER — Encounter: Payer: Self-pay | Admitting: Internal Medicine

## 2009-12-20 ENCOUNTER — Ambulatory Visit (HOSPITAL_COMMUNITY)
Admission: RE | Admit: 2009-12-20 | Discharge: 2009-12-21 | Payer: Self-pay | Source: Home / Self Care | Attending: Internal Medicine | Admitting: Internal Medicine

## 2009-12-22 ENCOUNTER — Encounter: Payer: Self-pay | Admitting: Internal Medicine

## 2009-12-29 ENCOUNTER — Encounter
Admission: RE | Admit: 2009-12-29 | Discharge: 2009-12-29 | Payer: Self-pay | Source: Home / Self Care | Attending: Family Medicine | Admitting: Family Medicine

## 2010-01-01 ENCOUNTER — Ambulatory Visit: Payer: Self-pay | Admitting: Internal Medicine

## 2010-01-01 ENCOUNTER — Ambulatory Visit: Payer: Self-pay

## 2010-01-01 ENCOUNTER — Encounter: Payer: Self-pay | Admitting: Internal Medicine

## 2010-01-01 LAB — CONVERTED CEMR LAB
BUN: 18 mg/dL (ref 6–23)
GFR calc non Af Amer: 56.78 mL/min — ABNORMAL LOW (ref >60.00)
Potassium: 4.2 meq/L (ref 3.5–5.1)
Sodium: 141 meq/L (ref 135–145)

## 2010-01-02 ENCOUNTER — Ambulatory Visit: Payer: Self-pay | Admitting: Cardiology

## 2010-01-11 ENCOUNTER — Ambulatory Visit: Payer: Self-pay | Admitting: Cardiology

## 2010-01-11 LAB — CONVERTED CEMR LAB
CO2: 27 meq/L (ref 19–32)
Calcium: 8.5 mg/dL (ref 8.4–10.5)
Chloride: 104 meq/L (ref 96–112)
Sodium: 139 meq/L (ref 135–145)

## 2010-01-18 ENCOUNTER — Ambulatory Visit: Admission: RE | Admit: 2010-01-18 | Discharge: 2010-01-18 | Payer: Self-pay | Source: Home / Self Care

## 2010-01-18 ENCOUNTER — Ambulatory Visit
Admission: RE | Admit: 2010-01-18 | Discharge: 2010-01-18 | Payer: Self-pay | Source: Home / Self Care | Attending: Pulmonary Disease | Admitting: Pulmonary Disease

## 2010-01-18 ENCOUNTER — Encounter: Payer: Self-pay | Admitting: Pulmonary Disease

## 2010-01-18 LAB — CONVERTED CEMR LAB: POC INR: 3.6

## 2010-01-29 ENCOUNTER — Telehealth: Payer: Self-pay | Admitting: Family Medicine

## 2010-01-30 ENCOUNTER — Ambulatory Visit
Admission: RE | Admit: 2010-01-30 | Discharge: 2010-01-30 | Payer: Self-pay | Source: Home / Self Care | Attending: Internal Medicine | Admitting: Internal Medicine

## 2010-02-06 ENCOUNTER — Encounter: Payer: Self-pay | Admitting: Family Medicine

## 2010-02-07 ENCOUNTER — Emergency Department (HOSPITAL_COMMUNITY)
Admission: EM | Admit: 2010-02-07 | Discharge: 2010-02-07 | Payer: Self-pay | Source: Home / Self Care | Admitting: Emergency Medicine

## 2010-02-07 LAB — CBC
HCT: 42.9 % (ref 39.0–52.0)
Hemoglobin: 14.3 g/dL (ref 13.0–17.0)
MCV: 99.5 fL (ref 78.0–100.0)
Platelets: 231 10*3/uL (ref 150–400)
RBC: 4.31 MIL/uL (ref 4.22–5.81)
WBC: 6.5 10*3/uL (ref 4.0–10.5)

## 2010-02-07 LAB — BASIC METABOLIC PANEL
BUN: 12 mg/dL (ref 6–23)
CO2: 25 mEq/L (ref 19–32)
GFR calc Af Amer: >60 mL/min (ref >60)
GFR calc non Af Amer: >60 mL/min (ref >60)

## 2010-02-07 LAB — DIFFERENTIAL
Lymphocytes Relative: 31 % (ref 12–46)
Lymphs Abs: 2 10*3/uL (ref 0.7–4.0)
Neutro Abs: 3.2 10*3/uL (ref 1.7–7.7)
Neutrophils Relative %: 50 % (ref 43–77)

## 2010-02-07 LAB — POCT CARDIAC MARKERS
CKMB, poc: <1 ng/mL — ABNORMAL LOW (ref 1.0–8.0)
Myoglobin, poc: 52.7 ng/mL (ref 12–200)

## 2010-02-08 ENCOUNTER — Ambulatory Visit: Admission: RE | Admit: 2010-02-08 | Discharge: 2010-02-08 | Payer: Self-pay | Source: Home / Self Care

## 2010-02-11 LAB — CONVERTED CEMR LAB
ALT: 49 units/L (ref 0–53)
AST: 48 units/L — ABNORMAL HIGH (ref 0–37)
BUN: 19 mg/dL (ref 6–23)
Basophils Relative: 0.5 % (ref 0.0–3.0)
Bilirubin, Direct: 0.1 mg/dL (ref 0.0–0.3)
Calcium: 9.3 mg/dL (ref 8.4–10.5)
Creatinine, Ser: 1.1 mg/dL (ref 0.4–1.5)
Eosinophils Absolute: 0.4 10*3/uL (ref 0.0–0.7)
Eosinophils Relative: 5.8 % — ABNORMAL HIGH (ref 0.0–5.0)
GFR calc non Af Amer: 70.83 mL/min (ref >60)
Glucose, Bld: 108 mg/dL — ABNORMAL HIGH (ref 70–99)
HCT: 42.7 % (ref 39.0–52.0)
Hemoglobin: 14.6 g/dL (ref 13.0–17.0)
Lymphs Abs: 2.1 10*3/uL (ref 0.7–4.0)
MCHC: 34.3 g/dL (ref 30.0–36.0)
MCV: 102.2 fL — ABNORMAL HIGH (ref 78.0–100.0)
Monocytes Absolute: 0.6 10*3/uL (ref 0.1–1.0)
Neutro Abs: 4.2 10*3/uL (ref 1.4–7.7)
Neutrophils Relative %: 56.9 % (ref 43.0–77.0)
RBC: 4.18 M/uL — ABNORMAL LOW (ref 4.22–5.81)
TSH: 1.04 microintl units/mL (ref 0.35–5.50)
Total Bilirubin: 0.8 mg/dL (ref 0.3–1.2)
Total CHOL/HDL Ratio: 4

## 2010-02-15 NOTE — Assessment & Plan Note (Signed)
Summary: per check out/sf   Primary Provider:  Burnett Harry   History of Present Illness: [name] is a pleasant gentleman with a history of coronary artery disease, mixed ischemic and nonischemic cardiomyopathy, history of ICD, paroxysmal atrial fibrillation, and ventricular tachycardia. Previous carotid Dopplers in April 2006 showed 0-39% stenosis bilaterally. Previous abdominal ultrasound in April 2006 showed no aneurysm. Last cardiac catheterization in October 2008 showed an occluded right coronary artery with left-to-right collateralization. Ejection fraction is 20%. There was no other coronary disease noted. Last Myoview in June of 2010 showed prior inferior infarct but no ischemia. His ejection fraction was 21%. I last saw him in May of 2011. Since then he was seen by Dr. Caryl Comes and found to be in slow ventricular tachycardia. His amiodarone was increased. At present he he does note occasional dyspnea on exertion relieved with rest. There is no orthopnea, PND, pedal edema, palpitations, syncope or chest pain.  Current Medications (verified): 1)  Furosemide 40 Mg Tabs (Furosemide) .... Take 1 Tablet By Mouth Once A Day 2)  Nitroquick 0.4 Mg Subl (Nitroglycerin) .... Take By Mouth As Needed 3)  Saw Palmetto 450 Mg  Caps (Saw Palmetto (Serenoa Repens)) .... Take 1 Tablet By Mouth Two Times A Day 4)  Multivitamins   Caps (Multiple Vitamin) .... Take 1 Tablet By Mouth Once A Day 5)  Low-Dose Aspirin 81 Mg  Tabs (Aspirin) .... Take 1 Tablet By Mouth Once A Day 6)  Mexiletine Hcl 150 Mg  Caps (Mexiletine Hcl) .... Take 2 Tabs By Mouth Two Times A Day 7)  Diovan 80 Mg Tabs (Valsartan) .... One Tablet By Mouth Once Daily 8)  Amiodarone Hcl 200 Mg Tabs (Amiodarone Hcl) .... Take 1 and 1/2 Tablets Daily. 9)  Crestor 20 Mg Tabs (Rosuvastatin Calcium) .... Take One Tablet By Mouth Daily. 10)  Coumadin 5 Mg Tabs (Warfarin Sodium) .... Take As Directed By Coumadin Clinic. 11)  Carvedilol 25 Mg Tabs  (Carvedilol) .[name].. 1 1/2 Tab By Mouth Two Times A Day.....Marland Kitchenequals 37.79m Two Times A Day 12)  Gnp Flax Seed Oil 1000 Mg Caps (Flaxseed (Linseed)) .... Take 1 Capsule By Mouth Two Times A Day 13)  Dulcolax Stool Softener 100 Mg Caps (Docusate Sodium) ..[name]. 1 Tab By Mouth Two Times A Day 14)  Preservision/lutein  Caps (Multiple Vitamins-Minerals) ..[name]. 1 Tab By Mouth Once Daily 15)  Align  Caps (Probiotic Product) .... Take One Capsule Daily 16)  Xyzal 5 Mg Tabs (Levocetirizine Dihydrochloride) .... Once Daily  Allergies (verified): 1)  ! Sulfa  Past History:  Past Medical History: Reviewed history from 11/11/2008 and no changes required. ICD - IN SITU (ICD-V45.02) ATRIAL FIBRILLATION, HX OF (ICD-V12.59) CARDIOMYOPATHY (ICD-425.4) HYPERTENSION (ICD-401.9) HYPERLIPIDEMIA (ICD-272.4) ALLERGIC RHINITIS CAUSE UNSPECIFIED (ICD-477.9) OBSTRUCTIVE SLEEP APNEA (ICD-327.23) Coronary Artery Disease Ventricular tachycardia    Past Surgical History: Reviewed history from 05/09/2008 and no changes required. ablation ICD - Guidant Contak H170 - [place]eboraha Sprang MD - 07/25/06 (2nd device)  Social History: Reviewed history from 05/09/2008 and no changes required. Patient states former smoker.  pt is married. pt has children. pt is retired. Pt owned a company.  Alcohol Use - yes - socially  Review of Systems       Some problems with constipation and burning sensation in his legs bilaterally with ambulation but no fevers or chills, productive cough, hemoptysis, dysphasia, odynophagia, melena, hematochezia, dysuria, hematuria, rash, seizure activity, orthopnea, PND, pedal edema. Remaining systems are negative.   Vital Signs:  Patient  profile:   70 year old male Weight:      257 pounds Pulse rate:   70 / minute Pulse rhythm:   regular BP sitting:   94 / 50  (left arm) Cuff size:   large  Vitals Entered By: Fredia Beets, RN (November 28, 2009 10:15 AM)  Physical Exam  General:   Well-developed well-nourished in no acute distress.  Skin is warm and dry.  HEENT is normal.  Neck is supple. No thyromegaly.  Chest is clear to auscultation with normal expansion.  Cardiovascular exam is regular rate and rhythm. 2/6 systolic murmur at apex. Abdominal exam nontender or distended. No masses palpated. Extremities show no edema. neuro grossly intact    EKG  Procedure date:  11/28/2009  Findings:      Sinus with ventricular pacing   ICD Specifications Following MD:  Virl Axe, MD     Referring MD:  Stanford Breed ICD Vendor:  [employer] Number:  [id number]     ICD Serial Number:  [id number] ICD DOI:  07/25/2006     ICD Implanting MD:  Virl Axe, MD  Lead 1:    Location: [facility]     DOI: 06/09/2000     Model #: [id number]     Serial #: [id number]     Status: active Lead 2:    Location: [facility]     DOI: 06/09/2000     Model #: [id number]     Serial #: [id number]     Status: active  Indications::  ICM   ICD Follow Up ICD Dependent:  No      Episodes Coumadin:  Yes  Brady Parameters Mode DDDR     Lower Rate Limit:  60     Upper Rate Limit 110 PAV 180     Sensed AV Delay:  300  Tachy Zones VF:  200     VT:  180     VT1:  135     Impression & Recommendations:  Problem # 1:  CLAUDICATION (ICD-443.9) Possible claudication. Check ABIs with Doppler.  Problem # 2:  PURE HYPERCHOLESTEROLEMIA (ICD-272.0) Continue statin. Check lipids and liver. His updated medication list for this problem includes:    Crestor 20 Mg Tabs (Rosuvastatin calcium) .[name]... Take one tablet by mouth daily.  Problem # 3:  CAD (ICD-414.00) Continue aspirin, beta blocker, ARB and statin. His updated medication list for this problem includes:    Nitroquick 0.4 Mg Subl (Nitroglycerin) .[name]... Take by mouth as needed    Low-dose Aspirin 81 Mg Tabs (Aspirin) .[name]... Take 1 tablet by mouth once a day    Coumadin 5 Mg Tabs (Warfarin sodium) .[name]... Take as directed by coumadin clinic.    Carvedilol 25 Mg Tabs  (Carvedilol) .[name]... 1 1/2 tab by mouth two times a day.....Marland Kitchenequals 37.62m two times a day  Problem # 4:  COUMADIN THERAPY (ICD-V58.61) Goal INR 2-3. Check CBC.  Problem # 5:  ATRIAL FIBRILLATION (ICD-427.31) Assessment: New Continue amiodarone and coumadin; check TSH; LFTs and chest xray His updated medication list for this problem includes:    Low-dose Aspirin 81 Mg Tabs (Aspirin) ..[name].. Take 1 tablet by mouth once a day    Mexiletine Hcl 150 Mg Caps (Mexiletine hcl) ..[name].. Take 2 tabs by mouth two times a day    Amiodarone Hcl 200 Mg Tabs (Amiodarone hcl) ..[name].. Take 1 and 1/2 tablets daily.    Coumadin 5 Mg Tabs (Warfarin sodium) ..[name].. Take as directed by  coumadin clinic.    Carvedilol 25 Mg Tabs (Carvedilol) .[name]... 1 1/2 tab by mouth two times a day.....Marland Kitchenequals 37.68m two times a day  Problem # 6:  VENTRICULAR TACHYCARDIA (ICD-427.1) Continued amiodarone and mexiletine. Followup Dr. KCaryl Comes His updated medication list for this problem includes:    Nitroquick 0.4 Mg Subl (Nitroglycerin) ..[name].. Take by mouth as needed    Low-dose Aspirin 81 Mg Tabs (Aspirin) ..[name].. Take 1 tablet by mouth once a day    Mexiletine Hcl 150 Mg Caps (Mexiletine hcl) ..[name].. Take 2 tabs by mouth two times a day    Amiodarone Hcl 200 Mg Tabs (Amiodarone hcl) ..[name].. Take 1 and 1/2 tablets daily.    Coumadin 5 Mg Tabs (Warfarin sodium) ..[name].. Take as directed by coumadin clinic.    Carvedilol 25 Mg Tabs (Carvedilol) ..[name].. 1 1/2 tab by mouth two times a day......Marland Kitchenquals 37.522mtwo times a day  Problem # 7:  CARDIOMYOPATHY, ISCHEMIC (ICD-414.8) Repeat echocardiogram. He sounds to have a mitral regurgitation murmur. His updated medication list for this problem includes:    Furosemide 40 Mg Tabs (Furosemide) ...[name]. Take 1 tablet by mouth once a day    Nitroquick 0.4 Mg Subl (Nitroglycerin) ...[name]. Take by mouth as needed    Low-dose Aspirin 81 Mg Tabs (Aspirin) ...[name]. Take 1 tablet by mouth once a day    Mexiletine Hcl 150 Mg  Caps (Mexiletine hcl) ...[name]. Take 2 tabs by mouth two times a day    Diovan 80 Mg Tabs (Valsartan) ..... One tablet by mouth once daily    Amiodarone Hcl 200 Mg Tabs (Amiodarone hcl) ...[name]. Take 1 and 1/2 tablets daily.    Coumadin 5 Mg Tabs (Warfarin sodium) ...[name]. Take as directed by coumadin clinic.    Carvedilol 25 Mg Tabs (Carvedilol) ...[name]. 1 1/2 tab by mouth two times a day......eMarland Kitchenuals 37.23m28mwo times a day  Orders: T-2 View CXR (71020TC) Echocardiogram (Echo)  Problem # 8:  ICD - CRT LV PORT PLUGGED GDT (ICD-V45.02) Management per electrophysiology.  Problem # 9:  HYPERTENSION (ICD-401.9) Continue present medications. Check potassium and renal function. His updated medication list for this problem includes:    Furosemide 40 Mg Tabs (Furosemide) ....[name] Take 1 tablet by mouth once a day    Low-dose Aspirin 81 Mg Tabs (Aspirin) ....[name] Take 1 tablet by mouth once a day    Diovan 80 Mg Tabs (Valsartan) ..... One tablet by mouth once daily    Carvedilol 25 Mg Tabs (Carvedilol) ....[name] 1 1/2 tab by mouth two times a day......eqMarland Kitchenals 37.23mg47mo times a day  Other Orders: Arterial Duplex Lower Extremity (Arterial Duplex Low)  Patient Instructions: 1)  Your physician wants you to follow-up in: 6 MOCorningl receive a reminder letter in the mail two months in advance. If you don't receive a letter, please call our office to schedule the follow-up appointment. 2)  Your physician has requested that you have an ankle brachial index (ABI). During this test an ultrasound and blood pressure cuff are used to evaluate the arteries that supply the arms and legs with blood. Allow thirty minutes for this exam. There are no restrictions or special instructions. 3)  Your physician has requested that you have a lower or upper extremity arterial duplex.  This test is an ultrasound of the arteries in the legs or arms.  It looks at arterial blood flow in the legs and arms.  Allow one hour for Lower and Upper  Arterial scans. There  are no restrictions or special instructions. 4)  Your physician has requested that you have an echocardiogram.  Echocardiography is a painless test that uses sound waves to create images of your heart. It provides your doctor with information about the size and shape of your heart and how well your heart's chambers and valves are working.  This procedure takes approximately one hour. There are no restrictions for this procedure.

## 2010-02-15 NOTE — Assessment & Plan Note (Signed)
Summary: HOSPITAL CONSULT   Primary Provider/Referring Provider:  Burnett Harry   History of Present Illness: December 10, 2009   70 y/o WM with PMH of CAD, mixed ischemic & non-ischemic cardiomyopathy, history of ICD, paroxysmal atrial fibrillation on coumadin, ventricular tachycardia & OSA on CPAP.  Presented to [facility] ED with complaints of shortness of breath, cough with bloody streaks/pink & yellow  that began 3 days ago which worsened overnight prompting visit to ED.  Finished zithromax 11/26 for outpatient Rx of bronchitis.  Denies fevers, chills, n/v/d.  Evaluation in ED demonstrates CT scan c/w edema, small bilateral pleural effusions.  Last seen by Dr. Gwenette Greet 1/11 for OSA follow-up.    Allergies: 1)  ! Sulfa  Past History:  Past Medical History: Last updated: 11/11/2008 ICD - IN SITU (ICD-V45.02) ATRIAL FIBRILLATION, HX OF (ICD-V12.59) CARDIOMYOPATHY (ICD-425.4) HYPERTENSION (ICD-401.9) HYPERLIPIDEMIA (ICD-272.4) ALLERGIC RHINITIS CAUSE UNSPECIFIED (ICD-477.9) OBSTRUCTIVE SLEEP APNEA (ICD-327.23) Coronary Artery Disease Ventricular tachycardia    Family History: Last updated: 05/09/2008 heart disease: mother cancer: maternal grandmoter (breast)  Family History of Coronary Artery Disease:  Family History of Hypertension:   Social History: Last updated: 05/09/2008 Patient states former smoker.  pt is married. pt has children. pt is retired. Pt owned a company.  Alcohol Use - yes - socially  Past Surgical History: Reviewed history from 05/09/2008 and no changes required. ablation ICD - Guidant Contak H170 - Deboraha Sprang, MD - 07/25/06 (2nd device)  Family History: Reviewed history from 05/09/2008 and no changes required. heart disease: mother cancer: maternal grandmoter (breast)  Family History of Coronary Artery Disease:  Family History of Hypertension:   Social History: Reviewed history from 05/09/2008 and no changes required. Patient states former smoker.    pt is married. pt has children. pt is retired. Pt owned a company.  Alcohol Use - yes - socially  Review of Systems       The patient complains of shortness of breath with activity, shortness of breath at rest, and productive cough.  The patient denies anorexia, fever, weight loss, weight gain, vision loss, decreased hearing, hoarseness, chest pain, syncope, dyspnea on exertion, peripheral edema, prolonged cough, headaches, hemoptysis, abdominal pain, melena, hematochezia, severe indigestion/heartburn, hematuria, incontinence, genital sores, muscle weakness, suspicious skin lesions, transient blindness, difficulty walking, depression, unusual weight change, abnormal bleeding, enlarged lymph nodes, angioedema, breast masses, testicular masses, non-productive cough, coughing up blood, irregular heartbeats, acid heartburn, indigestion, loss of appetite, weight change, difficulty swallowing, sore throat, tooth/dental problems, nasal congestion/difficulty breathing through nose, sneezing, itching, ear ache, anxiety, hand/feet swelling, joint stiffness or pain, rash, and change in color of mucus.    Vital Signs:  Patient profile:   70 year old male O2 Sat:      96 % on 4 L/min Temp:     98.0 degrees F Pulse rate:   60 / minute Resp:     18 per minute BP sitting:   106 / 68  O2 Flow:  4 L/min  Physical Exam  Additional Exam:  General: well developed adult in NAD HEENT: [place]/AT.  Mucus membranes pink/moist.  No JVD.  No lymphadenopathy.  Neuro: Awake, alert, oriented.  MAE, speech clear.  EOMI, PERRLA CV: S1S2 RRR (paced on monitor), no murmurs/rubs/gallops.  Radial pulses +2.   Pulm: Resp's even and non-labored.  Lungs bilaterally with crackles throughout.  GI: abdomen round/soft, non-tender to palpation.  BSx4 active. No organomegaly. Skin: no rashes or lesions Ext: warm/dry.  No edema      Impression &  Recommendations:  Problem # 1:  ACUTE ON CHRONIC SYSTOLIC HEART FAILURE  (JWJ-191.47) Acute decompensated systolic CHF.  CT consistent with CHF, bilateral small effusions.  PLAN: -per cards -diuresis -continue home BP meds/coumadin -change from vanco/zosyn to rocephin / azithro -will need home CPAP as inpatient -no role for steroids -follow up cxr -no need for thora at this point  Problem # 2:  Fraser (ICD-327.23) PLAN: -CPAP per RT at bedtime & as needed sleep -

## 2010-02-15 NOTE — Medication Information (Signed)
Summary: rov/sp  Anticoagulant Therapy  Managed by: Porfirio Oar, PharmD Referring MD: Kirk Ruths MD PCP: Theodoro Kalata MD: Ron Parker MD, Dellis Filbert Indication 1: Atrial Fibrillation (ICD-427.31) Lab Used: [facility] Site: [facility] INR POC 3.6 INR RANGE 2 - 3  Dietary changes: no    Health status changes: no    Bleeding/hemorrhagic complications: no    Recent/future hospitalizations: no    Any changes in medication regimen? yes       Details: Doubled furosemide dose and started klor-con  Recent/future dental: no  Any missed doses?: no       Is patient compliant with meds? yes       Allergies: 1)  ! Sulfa  Anticoagulation Management History:      The patient is taking warfarin and comes in today for a routine follow up visit.  Positive risk factors for bleeding include an age of 14 years or older.  The bleeding index is 'intermediate risk'.  Positive CHADS2 values include History of CHF and History of HTN.  Negative CHADS2 values include Age > 71 years old.  The start date was 10/20/2002.  His last INR was 2.5.  Anticoagulation responsible provider: Ron Parker MD, Dellis Filbert.  INR POC: 3.6.  Cuvette Lot#: [id number].  Exp: 11/15/2010.    Anticoagulation Management Assessment/Plan:      The patient's current anticoagulation dose is Coumadin 5 mg tabs: Take as directed by coumadin clinic..  The target INR is 2 - 3.  The next INR is due 02/08/2010.  Anticoagulation instructions were given to patient.  Results were reviewed/authorized by Porfirio Oar, PharmD.  He was notified by Ernst Bowler PharmD candidate.         Prior Anticoagulation Instructions: INR 3.0  Take 1/2 tablet today then resume same dose of 1/2 tablet every day except 1 tablet on Monday, Wednesday and Friday.  Recheck INR in 2-3 weeks.   Current Anticoagulation Instructions: INR 3.6 (goal=2-3)  Skip today's dose and resume normal Coumadin regimen tomorrow with 1 tablet on Mondays, Wednesdays, and Fridays and 1/2  tablet on Sundays, Tuesdays, Thursdays, and Saturdays.

## 2010-02-15 NOTE — Letter (Signed)
Summary: Device-Delinquent Phone Transmission  [facility]  [address]   [place], [place] [address]   Phone: [phone]  Fax: [phone]     June 16, 2009 MRN: [id number]   [name] [address] [place], [place]  [address]   Dear Mr. Hollenback,  According to our records, you were scheduled for a device phone transmission on                              .06/15/09     We did not receive any results from this check.  If you transmitted on your scheduled day, please call us to help troubleshoot your system.  If you forgot to send your transmission, please send one upon receipt of this letter.  Thank you,  Perrin Maltese, EMT-P  June 16, 2009 3:18 PM  [facility]

## 2010-02-15 NOTE — Medication Information (Signed)
Summary: rov-tp  Anticoagulant Therapy  Managed by: Gypsy Lore, PharmD Referring MD: Kirk Ruths MD PCP: Theodoro Kalata MD: Stanford Breed MD, Aaron Edelman Indication 1: Atrial Fibrillation (ICD-427.31) Lab Used: [facility] Site: [facility] INR POC 2.8 INR RANGE 2 - 3  Dietary changes: yes       Details: Has not been eating as many leafy greens due to stomach upset/gas.  Health status changes: no    Bleeding/hemorrhagic complications: no    Recent/future hospitalizations: no    Any changes in medication regimen? yes       Details: restarted Xyzal for allergies  Recent/future dental: no  Any missed doses?: no       Is patient compliant with meds? yes      Comments: Sore throat recently, saw MD yesterday, restarted Xyzal seasonally.   Current Medications (verified): 1)  Furosemide 40 Mg Tabs (Furosemide) .... Take 1 Tablet By Mouth Once A Day 2)  Nitroquick 0.4 Mg Subl (Nitroglycerin) .... Take By Mouth As Needed 3)  Saw Palmetto 450 Mg  Caps (Saw Palmetto (Serenoa Repens)) .... Take 1 Tablet By Mouth Two Times A Day 4)  Multivitamins   Caps (Multiple Vitamin) .... Take 1 Tablet By Mouth Once A Day 5)  Low-Dose Aspirin 81 Mg  Tabs (Aspirin) .... Take 1 Tablet By Mouth Once A Day 6)  Mexiletine Hcl 150 Mg  Caps (Mexiletine Hcl) .... Take 2 Tabs By Mouth Two Times A Day 7)  Diovan 80 Mg Tabs (Valsartan) .... One Tablet By Mouth Once Daily 8)  Amiodarone Hcl 200 Mg Tabs (Amiodarone Hcl) .... Take 1 and 1/2 Tablets Daily. 9)  Crestor 20 Mg Tabs (Rosuvastatin Calcium) .... Take One Tablet By Mouth Daily. 10)  Coumadin 5 Mg Tabs (Warfarin Sodium) .... Take As Directed By Coumadin Clinic. 11)  Carvedilol 25 Mg Tabs (Carvedilol) .[name].. 1 1/2 Tab By Mouth Two Times A Day.....Marland Kitchenequals 37.68m Two Times A Day 12)  Gnp Flax Seed Oil 1000 Mg Caps (Flaxseed (Linseed)) .... Take 1 Capsule By Mouth Two Times A Day 13)  Dulcolax Stool Softener 100 Mg Caps (Docusate Sodium) ..[name]. 1 Tab By Mouth Two  Times A Day 14)  Preservision/lutein  Caps (Multiple Vitamins-Minerals) ..[name]. 1 Tab By Mouth Once Daily 15)  Veramyst 27.5 Mcg/spray  Susp (Fluticasone Furoate) ..[name]. 1 Puff in Each Nostril Once Daily 16)  Align  Caps (Probiotic Product) .... Take One Capsule Daily  Allergies (verified): 1)  ! Sulfa  Anticoagulation Management History:      The patient is taking warfarin and comes in today for a routine follow up visit.  Positive risk factors for bleeding include an age of 63years or older.  The bleeding index is 'intermediate risk'.  Positive CHADS2 values include History of CHF and History of HTN.  Negative CHADS2 values include Age > 783years old.  The start date was 10/20/2002.  His last INR was 2.5.  Anticoagulation responsible provider: CStanford BreedMD, BAaron Edelman  INR POC: 2.8.  Cuvette Lot#: [id number]  Exp: 10/15/2010.    Anticoagulation Management Assessment/Plan:      The patient's current anticoagulation dose is Coumadin 5 mg tabs: Take as directed by coumadin clinic..  The target INR is 2 - 3.  The next INR is due 09/15/2009.  Anticoagulation instructions were given to patient.  Results were reviewed/authorized by SGypsy Lore PharmD.  He was notified by SGypsy Lore PharmD.         Prior Anticoagulation Instructions: Take 1 tablet today  then take as follows: 1 tablet MWF, 0.5 tablet all other days.  Current Anticoagulation Instructions: INR 2.8  Continue Coumadin 0.5 tab (2.5 mg) on Sun, Tues, Thur, Sat and Coumadin 1 tab (5 mg) on Mon, Wed, Fri. Return to clinic in 4 weeks.

## 2010-02-15 NOTE — Medication Information (Signed)
Summary: rov/td  Anticoagulant Therapy  Managed by: Alinda Deem, Pharm D Referring MD: Kirk Ruths MD PCP: Theodoro Kalata MD: Caryl Comes MD, Remo Lipps Indication 1: Atrial Fibrillation (ICD-427.31) Lab Used: [facility] Site: [facility] INR POC 1.9 INR RANGE 2 - 3  Dietary changes: yes       Details: More vitamin K intake  Health status changes: no    Bleeding/hemorrhagic complications: no    Recent/future hospitalizations: no    Any changes in medication regimen? no    Recent/future dental: no  Any missed doses?: yes     Details: Missed 1 dose about 10 days ago  Is patient compliant with meds? yes       Allergies (verified): 1)  ! Sulfa  Anticoagulation Management History:      The patient is taking warfarin and comes in today for a routine follow up visit.  Positive risk factors for bleeding include an age of 69 years or older.  The bleeding index is 'intermediate risk'.  Positive CHADS2 values include History of HTN.  Negative CHADS2 values include Age > 69 years old.  The start date was 10/20/2002.  His last INR was 3.1 RATIO.  Anticoagulation responsible provider: Caryl Comes MD, Remo Lipps.  INR POC: 1.9.  Cuvette Lot#: [id number].  Exp: [id number].    Anticoagulation Management Assessment/Plan:      The patient's current anticoagulation dose is Coumadin 5 mg tabs: Take as directed by coumadin clinic..  The target INR is 2 - 3.  The next INR is due 02/15/2009.  Anticoagulation instructions were given to patient.  Results were reviewed/authorized by Alinda Deem, Pharm D.  He was notified by Merlyn Albert, Pharm D Candidate.         Prior Anticoagulation Instructions: Take 1/2 tablet on Thursday (12/16) then resume current dose: 1 tab - Sunday, Tuesday, Thursday, Saturday 1/2 tab - Monday, Wednesday, Friday  Current Anticoagulation Instructions: INR 1.9 Take extra 1/2 tablet ( total of 30m) today, then resume to 529mtablet daily except 2.89m28mn Mondays, Wednesdays, and  Fridays Recheck in 3 weeks

## 2010-02-15 NOTE — Medication Information (Signed)
Summary: rov/sl  Anticoagulant Therapy  Managed by: Gypsy Lore, PharmD Referring MD: Kirk Ruths MD PCP: Theodoro Kalata MD: Verl Blalock MD, Marcello Moores Indication 1: Atrial Fibrillation (ICD-427.31) Lab Used: [facility] Site: [facility] INR POC 1.9 INR RANGE 2 - 3  Dietary changes: no    Health status changes: no    Bleeding/hemorrhagic complications: no    Recent/future hospitalizations: no    Any changes in medication regimen? no    Recent/future dental: no  Any missed doses?: no       Is patient compliant with meds? yes       Allergies: 1)  ! Sulfa  Anticoagulation Management History:      Positive risk factors for bleeding include an age of 69 years or older.  The bleeding index is 'intermediate risk'.  Positive CHADS2 values include History of CHF and History of HTN.  Negative CHADS2 values include Age > 69 years old.  The start date was 10/20/2002.  His last INR was 2.5.  Anticoagulation responsible provider: Verl Blalock MD, Marcello Moores.  INR POC: 1.9.  Cuvette Lot#: [id number].  Exp: 10/15/2010.    Anticoagulation Management Assessment/Plan:      The patient's current anticoagulation dose is Coumadin 5 mg tabs: Take as directed by coumadin clinic..  The target INR is 2 - 3.  The next INR is due 09/29/2009.  Anticoagulation instructions were given to patient.  Results were reviewed/authorized by Gypsy Lore, PharmD.  He was notified by Mammie Lorenzo PharmD.         Prior Anticoagulation Instructions: INR 2.8  Continue Coumadin 0.5 tab (2.5 mg) on Sun, Tues, Thur, Sat and Coumadin 1 tab (5 mg) on Mon, Wed, Fri. Return to clinic in 4 weeks.   Current Anticoagulation Instructions: INR 1.9 Continue taking current regimen of 1 tablet on monday, wednesday, and friday. Take 0.5 tablets the other days.  Will Recheck in 2 weeks. See you then.

## 2010-02-15 NOTE — Consult Note (Signed)
Summary: [facility] Consultation Report  [facility] Consultation Report   Imported By: Roddie Mc 01/22/2010 16:51:20  _____________________________________________________________________  External Attachment:    Type:   Image     Comment:   External Document

## 2010-02-15 NOTE — Medication Information (Signed)
Summary: rov/sp  Anticoagulant Therapy  Managed by: Gwynneth Albright, PharmD Referring MD: Kirk Ruths MD PCP: Theodoro Kalata MD: Haroldine Laws MD, Quillian Quince Indication 1: Atrial Fibrillation (ICD-427.31) Lab Used: [facility] Site: [facility] INR POC 2.1 INR RANGE 2 - 3  Dietary changes: yes       Details: Has not eaten as many greens.  Health status changes: no    Bleeding/hemorrhagic complications: no    Recent/future hospitalizations: no    Any changes in medication regimen? yes       Details: Amiodarone increased a few weeks ago.  Recent/future dental: no  Any missed doses?: yes     Details: May have missed 1 dose several days ago.  Is patient compliant with meds? yes       Allergies: 1)  ! Sulfa  Anticoagulation Management History:      The patient is taking warfarin and comes in today for a routine follow up visit.  Positive risk factors for bleeding include an age of 70 years or older.  The bleeding index is 'intermediate risk'.  Positive CHADS2 values include History of CHF and History of HTN.  Negative CHADS2 values include Age > 7 years old.  The start date was 10/20/2002.  His last INR was 2.5.  Anticoagulation responsible provider: Bensimhon MD, Quillian Quince.  INR POC: 2.1.  Cuvette Lot#: [id number].  Exp: 08/2010.    Anticoagulation Management Assessment/Plan:      The patient's current anticoagulation dose is Coumadin 5 mg tabs: Take as directed by coumadin clinic..  The target INR is 2 - 3.  The next INR is due 07/25/2009.  Anticoagulation instructions were given to patient.  Results were reviewed/authorized by Gwynneth Albright, PharmD.  He was notified by Gwynneth Albright, PharmD.         Prior Anticoagulation Instructions: INR 3.5 Skip today  then change dose to 2.38ms everyday except 526m on Sundays and Thrusdays.  Recheck 7-10 days.   Current Anticoagulation Instructions: INR 2.1. Take 0.5 tablet daily except 1 tablet on Sun and Thurs. Recheck in 2 weeks.

## 2010-02-15 NOTE — Medication Information (Signed)
Summary: rov/tm  Anticoagulant Therapy  Managed by: Porfirio Oar, PharmD Referring MD: Kirk Ruths MD PCP: Theodoro Kalata MD: Johnsie Cancel MD, Collier Salina Indication 1: Atrial Fibrillation (ICD-427.31) Lab Used: [facility] Site: [facility] INR POC 1.6 INR RANGE 2 - 3  Dietary changes: no    Health status changes: no    Bleeding/hemorrhagic complications: no    Recent/future hospitalizations: yes       Details: pending lead revision by Dr. Caryl Comes tomorrow.  Will plan on staying overnight   Any changes in medication regimen? no    Recent/future dental: no  Any missed doses?: yes     Details: Held Coumadin on Sunday and Monday  Is patient compliant with meds? yes       Allergies: 1)  ! Sulfa  Anticoagulation Management History:      Positive risk factors for bleeding include an age of 70 years or older.  The bleeding index is 'intermediate risk'.  Positive CHADS2 values include History of CHF and History of HTN.  Negative CHADS2 values include Age > 6 years old.  The start date was 10/20/2002.  His last INR was 2.5.  Anticoagulation responsible provider: Johnsie Cancel MD, Collier Salina.  INR POC: 1.6.  Exp: 11/15/2010.    Anticoagulation Management Assessment/Plan:      The patient's current anticoagulation dose is Coumadin 5 mg tabs: Take as directed by coumadin clinic..  The target INR is 2 - 3.  The next INR is due 01/02/2010.  Anticoagulation instructions were given to patient.  Results were reviewed/authorized by Porfirio Oar, PharmD.  He was notified by Porfirio Oar PharmD.         Prior Anticoagulation Instructions: INR 2.6 Continue 2.48ms daily except 524m on Mondays, Wednesdays and Fridays. Recheck in 4 weeks.   Current Anticoagulation Instructions: INR 1.6  Start normal dose of 1/2 tablet every day except 1 tablet on Monday, Wednesday and Friday after procedure.  Recheck INR in 2 weeks.

## 2010-02-15 NOTE — Assessment & Plan Note (Signed)
Summary: device/saf   Visit Type:  ICD - Health visitor Provider:  Burnett Harry  CC:  no complaints.  History of Present Illness: [name] is seen in followup for congestive heart failure in the setting of ischemic/nonischemic cardiomyopathy paroxysmal atrial fibrillation and ventricular tachycardia. He also had complete heart block and 100% ventricularly paced and following admissions for congestive heart failure he underwent earlier this fall CRT-D upgrade.  He says he is  significantly improved since his CRT upgrade. He has seen Dr. Jarome Lamas in the interim; unfortunately he is also not available as his note is incomplete. 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   Last Myoview in Dec 2011 showed prior inferior infarct but no ischemia. His ejection fraction was 19%. An echocardiogram showed an ejection fraction of 20-25%, moderate left atrial enlargement and moderate mitral regurgitation. The patient subsequently had upgrade of his device to CRT-D. Since then, the patient denies any dyspnea on exertion, orthopnea, PND, pedal edema, palpitations, syncope or chest pain. He does note a productive cough of green sputum. His primary care physician started him on antibiotics.   Problems Prior to Update: 1)  Acute On Chronic Systolic Heart Failure  (MAU-633.35) 2)  Claudication  (ICD-443.9) 3)  Pure Hypercholesterolemia  (KTG-256.3) 4)  Systolic Heart Failure, Chronic  (ICD-428.22) 5)  Cad  (ICD-414.00) 6)  Coumadin Therapy  (ICD-V58.61) 7)  Atrial Fibrillation  (ICD-427.31) 8)  Sinus Bradycardia 1avb  (ICD-427.81) 9)  Ventricular Tachycardia  (ICD-427.1) 10)  Cardiomyopathy, Ischemic  (ICD-414.8) 11)  Chest Pain  (ICD-786.50) 12)  Encounter For Long-term Use of Other Medications  (ICD-V58.69) 13)  Icd - Crt Lv Port Plugged Gdt  (ICD-V45.02) 14)  Cough  (ICD-786.2) 15)  Hypertension  (ICD-401.9) 16)  Hyperlipidemia   (ICD-272.4) 17)  Allergic Rhinitis Cause Unspecified  (ICD-477.9) 18)  Obstructive Sleep Apnea  (ICD-327.23)  Current Medications (verified): 1)  Furosemide 40 Mg Tabs (Furosemide) .[name].. 1 Two Times A Day 2)  Nitroquick 0.4 Mg Subl (Nitroglycerin) .... Take By Mouth As Needed 3)  Saw Palmetto 450 Mg  Caps (Saw Palmetto (Serenoa Repens)) .... Take 1 Tablet By Mouth Two Times A Day 4)  Multivitamins   Caps (Multiple Vitamin) .... Take 1 Tablet By Mouth Once A Day 5)  Low-Dose Aspirin 81 Mg  Tabs (Aspirin) .... Take 1 Tablet By Mouth Once A Day 6)  Mexiletine Hcl 150 Mg  Caps (Mexiletine Hcl) .... Take 2 Tabs By Mouth Two Times A Day 7)  Diovan 80 Mg Tabs (Valsartan) .... One Tablet By Mouth Once Daily 8)  Amiodarone Hcl 200 Mg Tabs (Amiodarone Hcl) .... Take 1 and 1/2 Tablets Daily. 9)  Crestor 20 Mg Tabs (Rosuvastatin Calcium) .... Take One Tablet By Mouth Daily. 10)  Coumadin 5 Mg Tabs (Warfarin Sodium) .... Take As Directed By Coumadin Clinic. 11)  Coreg 25 Mg Tabs (Carvedilol) .... Take 1 1/2 Tabs By Mouth Two Times A Day 12)  Gnp Flax Seed Oil 1000 Mg Caps (Flaxseed (Linseed)) .... Take 1 Capsule By Mouth Two Times A Day 13)  Dulcolax Stool Softener 100 Mg Caps (Docusate Sodium) .[name].. 1 Tab By Mouth Two Times A Day 14)  Preservision/lutein  Caps (Multiple Vitamins-Minerals) .[name].. 1 Tab By Mouth Once Daily 15)  Align  Caps (Probiotic Product) .... Take One Capsule Daily 16)  Xyzal 5 Mg Tabs (Levocetirizine Dihydrochloride) .... Once Daily 17)  Caltrate 600 1500 Mg Tabs (Calcium  Carbonate) .... Take 1 Tablet By Mouth Two Times A Day 18)  Klor-Con M20 20 Meq Cr-Tabs (Potassium Chloride Crys Cr) .... Take 1 Tablet By Mouth Once Daily 19)  Fluticasone Propionate 50 Mcg/act Susp (Fluticasone Propionate) .... 2 Sprays in Each Nostril Once Daily 20)  Proventil Hfa 108 (90 Base) Mcg/act Aers (Albuterol Sulfate) .... 2 Puffs As Needed For Inhalation Every 4 Hours As Needed  Allergies (verified): 1)   ! Sulfa  Past History:  Past Medical History: Last updated: 11/11/2008 ICD - IN SITU (ICD-V45.02) ATRIAL FIBRILLATION, HX OF (ICD-V12.59) CARDIOMYOPATHY (ICD-425.4) HYPERTENSION (ICD-401.9) HYPERLIPIDEMIA (ICD-272.4) ALLERGIC RHINITIS CAUSE UNSPECIFIED (ICD-477.9) OBSTRUCTIVE SLEEP APNEA (ICD-327.23) Coronary Artery Disease Ventricular tachycardia    Past Surgical History: Last updated: 05/09/2008 ablation ICD - Guidant Contak H170 - Deboraha Sprang, MD - 07/25/06 (2nd device)  Family History: Last updated: 05/09/2008 heart disease: mother cancer: maternal grandmoter (breast)  Family History of Coronary Artery Disease:  Family History of Hypertension:   Social History: Last updated: 05/09/2008 Patient states former smoker.  pt is married. pt has children. pt is retired. Pt owned a company.  Alcohol Use - yes - socially  Risk Factors: Smoking Status: quit (01/04/2008)  Vital Signs:  Patient profile:   70 year old male Height:      72 inches Weight:      234.25 pounds BMI:     31.88 Pulse rate:   59 / minute BP sitting:   110 / 73  (right arm) Cuff size:   regular  Vitals Entered By: [place] (January 30, 2010 11:33 AM)  Physical Exam  General:  The patient was alert and oriented in no acute distress. HEENT Normal.  Neck veins were flat, carotids were brisk.  Lungs were clear.  Heart sounds were regular without murmurs or gallops.  Abdomen was soft with active bowel sounds. There is no clubbing cyanosis or edema. Skin Warm and dry well-healed    ICD Specifications Following MD:  Virl Axe, MD     Referring MD:  Stanford Breed ICD Vendor:  [place] Number:  [phone]     ICD Serial Number:  [id number] ICD DOI:  12/20/2009     ICD Implanting MD:  Virl Axe, MD  Lead 1:    Location: RA     DOI: 06/09/2000     Model #: [id number]     Serial #: [id number]     Status: active Lead 2:    Location: RV     DOI: 06/09/2000     Model #: [id number]      Serial #: [id number]     Status: active Lead 3:    Location: LV     DOI: 12/20/2009     Model #: [id number]     Serial #: [id number]     Status: active  Indications::  ICM  Explantation Comments: 12/20/09 [employer] [id number] explanted  ICD Follow Up Remote Check?  No Charge Time:  8.1 seconds     Battery Est. Longevity:  8 years ICD Dependent:  No       ICD Device Measurements Atrium:  Amplitude: 3.9 mV, Impedance: 545 ohms, Threshold: 0.5 V at 0.4 msec Right Ventricle:  Amplitude: 7.6 mV, Impedance: 689 ohms, Threshold: 1.2 V at 0.4 msec Left Ventricle:  Amplitude: 12.2 mV, Impedance: 924 ohms, Threshold: 2.3 V at 1.5 msec Shock Impedance: 50 ohms   Episodes MS Episodes:  0     Percent Mode  Switch:  0     Coumadin:  Yes Shock:  0     ATP:  0     Nonsustained:  40     Atrial Pacing:  100%     Ventricular Pacing:  100%  Brady Parameters Mode DDDR     Lower Rate Limit:  60     Upper Rate Limit 110 PAV 180     Sensed AV Delay:  300  Tachy Zones VF:  200     VT:  180     VT1:  100     Next Remote Date:  05/03/2010     Tech Comments:  No parameter changes.  LV threhold chronic as above.  Rate response blunted but the patient feels well and is active.  Latitude transmissions every 3 months.  ROV 12/12 with Dr. Caryl Comes. Alma Friendly, LPN  January 30, 6718 11:49 AM   Impression & Recommendations:  Problem # 1:  SYSTOLIC HEART FAILURE, CHRONIC (ICD-428.22) significantly improved following CRT The following medications were removed from the medication list:    Low-dose Aspirin 81 Mg Tabs (Aspirin) .[name]... Take 1 tablet by mouth once a day His updated medication list for this problem includes:    Furosemide 40 Mg Tabs (Furosemide) .[name]... 1 two times a day    Nitroquick 0.4 Mg Subl (Nitroglycerin) .[name]... Take by mouth as needed    Mexiletine Hcl 150 Mg Caps (Mexiletine hcl) .[name]... Take 2 tabs by mouth two times a day    Diovan 80 Mg Tabs (Valsartan) ..... One tablet by mouth once daily     Amiodarone Hcl 200 Mg Tabs (Amiodarone hcl) .[name]... Take 1 and 1/2 tablets daily.    Coumadin 5 Mg Tabs (Warfarin sodium) .[name]... Take as directed by coumadin clinic.    Coreg 25 Mg Tabs (Carvedilol) .[name]... Take 1 1/2 tabs by mouth two times a day  Problem # 2:  CARDIOMYOPATHY, ISCHEMIC (ICD-414.8) stable on current medications. We will discontinue his aspirin at this time  Problem # 3:  ATRIAL FIBRILLATION (ICD-427.31) paroxysmal atrial fibrillation on Coumadin The following medications were removed from the medication list:    Low-dose Aspirin 81 Mg Tabs (Aspirin) .[name]... Take 1 tablet by mouth once a day His updated medication list for this problem includes:    Mexiletine Hcl 150 Mg Caps (Mexiletine hcl) .[name]... Take 2 tabs by mouth two times a day    Amiodarone Hcl 200 Mg Tabs (Amiodarone hcl) .[name]... Take 1 and 1/2 tablets daily.    Coumadin 5 Mg Tabs (Warfarin sodium) .[name]... Take as directed by coumadin clinic.    Coreg 25 Mg Tabs (Carvedilol) .[name]... Take 1 1/2 tabs by mouth two times a day  Problem # 4:  VENTRICULAR TACHYCARDIA (ICD-427.1) There has been no intercurrent treated ventricular tachycardia. He continues to have nonsustained VT at cycle length of 550-600 ms. Given the issue of amiodarone contributing to lung injury, I will ask Dr. Rayann Heman to review the chart to consider the role of catheter ablation for his slow VT that might allow [place] to get him off the amiodarone  Problem # 5:  ICD - CRT (ICD-V45.02) Device parameters and data were reviewed changes were made to maximize longevity  Patient Instructions: 1)  Your physician recommends that you schedule a follow-up appointment in: 3 months with Dr. Caryl Comes 2)  Your physician has recommended you make the following change in your medication: Stop Aspirin

## 2010-02-15 NOTE — Medication Information (Signed)
Summary: rov  Anticoagulant Therapy  Managed by: Danella Penton, RN Referring MD: Kirk Ruths MD PCP: Theodoro Kalata MD: Ron Parker MD, Dellis Filbert Indication 1: Atrial Fibrillation (ICD-427.31) Lab Used: [facility] Site: [facility] INR POC 2.3 INR RANGE 2 - 3  Dietary changes: no    Health status changes: no    Bleeding/hemorrhagic complications: no    Recent/future hospitalizations: no    Any changes in medication regimen? no    Recent/future dental: no  Any missed doses?: no       Is patient compliant with meds? yes       Allergies: 1)  ! Sulfa  Anticoagulation Management History:      The patient is taking warfarin and comes in today for a routine follow up visit.  Positive risk factors for bleeding include an age of 69 years or older.  The bleeding index is 'intermediate risk'.  Positive CHADS2 values include History of CHF and History of HTN.  Negative CHADS2 values include Age > 79 years old.  The start date was 10/20/2002.  His last INR was 2.5.  Anticoagulation responsible provider: Ron Parker MD, Dellis Filbert.  INR POC: 2.3.  Cuvette Lot#: [id number].  Exp: 01/2011.    Anticoagulation Management Assessment/Plan:      The patient's current anticoagulation dose is Coumadin 5 mg tabs: Take as directed by coumadin clinic..  The target INR is 2 - 3.  The next INR is due 03/08/2010.  Anticoagulation instructions were given to patient.  Results were reviewed/authorized by Danella Penton, RN.  He was notified by Danella Penton, RN.         Prior Anticoagulation Instructions: INR 3.6 (goal=2-3)  Skip today's dose and resume normal Coumadin regimen tomorrow with 1 tablet on Mondays, Wednesdays, and Fridays and 1/2 tablet on Sundays, Tuesdays, Thursdays, and Saturdays.  Current Anticoagulation Instructions: INR 2.3 Continue taking 1/2 tablet every day except take 1 tablet on Mondays, Wednesdays, and Fridays. Recheck INR in 4 weeks.

## 2010-02-15 NOTE — Letter (Signed)
Summary: [facility] Office Note  [facility] Office Note   Imported By: Sallee Provencal 03/31/2009 14:22:59  _____________________________________________________________________  External Attachment:    Type:   Image     Comment:   External Document

## 2010-02-15 NOTE — Medication Information (Signed)
Summary: rov mb  Anticoagulant Therapy  Managed by: Porfirio Oar, PharmD Referring MD: Kirk Ruths MD PCP: Theodoro Kalata MD: Percival Spanish MD, Jeneen Rinks Indication 1: Atrial Fibrillation (ICD-427.31) Lab Used: [facility] Site: [facility] INR POC 5.2 INR RANGE 2 - 3  Dietary changes: no    Health status changes: yes       Details: had diarrhea, vomiting until last week- lasted about a week   Bleeding/hemorrhagic complications: no    Recent/future hospitalizations: no    Any changes in medication regimen? no    Recent/future dental: no  Any missed doses?: no       Is patient compliant with meds? yes       Allergies: 1)  ! Sulfa  Anticoagulation Management History:      The patient is taking warfarin and comes in today for a routine follow up visit.  Positive risk factors for bleeding include an age of 70 years or older.  The bleeding index is 'intermediate risk'.  Positive CHADS2 values include History of HTN.  Negative CHADS2 values include Age > 69 years old.  The start date was 10/20/2002.  His last INR was 2.5.  Anticoagulation responsible provider: Percival Spanish MD, Jeneen Rinks.  INR POC: 5.2.  Cuvette Lot#: [id number].  Exp: 08/2010.    Anticoagulation Management Assessment/Plan:      The patient's current anticoagulation dose is Coumadin 5 mg tabs: Take as directed by coumadin clinic..  The target INR is 2 - 3.  The next INR is due 06/06/2009.  Anticoagulation instructions were given to patient.  Results were reviewed/authorized by Porfirio Oar, PharmD.  He was notified by Porfirio Oar PharmD.         Prior Anticoagulation Instructions: INR = 2.5 (goal = 2-3)  The patient is to continue with the same dose of coumadin.  This dosage includes: 1 tablet on all days except for MWF take 1/5 tablet  Current Anticoagulation Instructions: INR 5.2  Skip today and tomorrow, take 1/2 tablet on Sunday then resume same dose of 1 tablet every day except 1/2 tablet on Monday, Wednesday and Friday

## 2010-02-15 NOTE — Cardiovascular Report (Signed)
Summary: Office Visit   Office Visit   Imported By: Sallee Provencal 01/12/2010 16:14:36  _____________________________________________________________________  External Attachment:    Type:   Image     Comment:   External Document

## 2010-02-15 NOTE — Assessment & Plan Note (Signed)
Summary: 6wk f/u   Primary Provider:  Burnett Harry  CC:  6 week follow up.  pt states he is doing well.[name]  History of Present Illness: [name] is a is seen in followup for ventricular tachycardia in thesetting of  ischemic and nonischemic cardiomyopathy. He has been treated with a combination of mexiletine and amiodarone without clinical recurrences. Most recent ejection fraction by catheterization was 20% and Myoview June 2010 showed prior inferior infarct no ischemia and ejection fraction of 20% He has done well on a higher dose of amiodarone dose her by Dr. Lovena Le a few months ago in my absence. He had no significant untowards related to that medication  Current Medications (verified): 1)  Furosemide 40 Mg Tabs (Furosemide) .... Take 1 Tablet By Mouth Once A Day 2)  Nitroquick 0.4 Mg Subl (Nitroglycerin) .... Take By Mouth As Needed 3)  Saw Palmetto 450 Mg  Caps (Saw Palmetto (Serenoa Repens)) .... Take 1 Tablet By Mouth Two Times A Day 4)  Multivitamins   Caps (Multiple Vitamin) .... Take 1 Tablet By Mouth Once A Day 5)  Low-Dose Aspirin 81 Mg  Tabs (Aspirin) .... Take 1 Tablet By Mouth Once A Day 6)  Mexiletine Hcl 150 Mg  Caps (Mexiletine Hcl) .... Take 2 Tabs By Mouth Two Times A Day 7)  Diovan 80 Mg Tabs (Valsartan) .... One Tablet By Mouth Once Daily-Pt Has Not Started This Medication As of Yet.  He Will Start March 8)  Amiodarone Hcl 400 Mg Tabs (Amiodarone Hcl) .... Take One Tablet Once Daily 9)  Crestor 20 Mg Tabs (Rosuvastatin Calcium) .... Take One Tablet By Mouth Daily. 10)  Coumadin 5 Mg Tabs (Warfarin Sodium) .... Take As Directed By Coumadin Clinic. 11)  Carvedilol 25 Mg Tabs (Carvedilol) .[name].. 1 1/2 Tab By Mouth Two Times A Day.....Marland Kitchenequals 37.68m Two Times A Day 12)  Gnp Flax Seed Oil 1000 Mg Caps (Flaxseed (Linseed)) .... Take 1 Capsule By Mouth Two Times A Day 13)  Nexium 40 Mg  Cpdr (Esomeprazole Magnesium) .... Take 1 Tablet By Mouth Once A Day 14)  Dulcolax Stool Softener  100 Mg Caps (Docusate Sodium) ..[name]. 1 Tab By Mouth Two Times A Day 15)  Preservision/lutein  Caps (Multiple Vitamins-Minerals) ..[name]. 1 Tab By Mouth Once Daily 16)  Caltrate 600 1500 Mg Tabs (Calcium Carbonate) ..[name]. 1 Tab By Mouth Once Daily 17)  Veramyst 27.5 Mcg/spray  Susp (Fluticasone Furoate) ..[name]. 1 Puff in Each Nostril Once Daily 18)  Xyzal 5 Mg  Tabs (Levocetirizine Dihydrochloride) .... By Mouth Daily 19)  Align  Caps (Probiotic Product) .... Take One Capsule Daily  Allergies (verified): 1)  ! Sulfa  Past History:  Past Medical History: Last updated: 11/11/2008 ICD - IN SITU (ICD-V45.02) ATRIAL FIBRILLATION, HX OF (ICD-V12.59) CARDIOMYOPATHY (ICD-425.4) HYPERTENSION (ICD-401.9) HYPERLIPIDEMIA (ICD-272.4) ALLERGIC RHINITIS CAUSE UNSPECIFIED (ICD-477.9) OBSTRUCTIVE SLEEP APNEA (ICD-327.23) Coronary Artery Disease Ventricular tachycardia    Past Surgical History: Last updated: 05/09/2008 ablation ICD - Guidant Contak H170 - [place]eboraha Sprang MD - 07/25/06 (2nd device)  Family History: Last updated: 05/09/2008 heart disease: mother cancer: maternal grandmoter (breast)  Family History of Coronary Artery Disease:  Family History of Hypertension:   Social History: Last updated: 05/09/2008 Patient states former smoker.  pt is married. pt has children. pt is retired. Pt owned a company.  Alcohol Use - yes - socially  Vital Signs:  Patient profile:   70year old male Height:      72 inches Weight:  257 pounds BMI:     34.98 Pulse rate:   59 / minute Pulse rhythm:   regular BP supine:   109 / 73  (left arm) Cuff size:   large  Vitals Entered By: Doug Sou CMA (August 10, 2009 10:48 AM)  Physical Exam  General:  The patient was alert and oriented in no acute distress. HEENT Normal.  Neck veins were flat, carotids were brisk.  Lungs were clear.  Heart sounds were regular without murmurs or gallops.  Abdomen was soft with active bowel sounds. There is no  clubbing cyanosis or edema. Skin Warm and dry     ICD Specifications Following MD:  Virl Axe, MD     Referring MD:  Stanford Breed ICD Vendor:  [place] Number:  [id number]     ICD Serial Number:  [id number] ICD DOI:  07/25/2006     ICD Implanting MD:  Virl Axe, MD  Lead 1:    Location: RA     DOI: 06/09/2000     Model #: [id number]     Serial #: [id number]     Status: active Lead 2:    Location: RV     DOI: 06/09/2000     Model #: [id number]     Serial #: [id number]     Status: active  Indications::  ICM   ICD Follow Up Remote Check?  No Battery Voltage:  2.95 V     Charge Time:  6.9 seconds     Underlying rhythm:  Brady @ 48 ICD Dependent:  No       ICD Device Measurements Atrium:  Amplitude: 1.5 mV, Impedance: 566 ohms, Threshold: 0.6 V at 0.4 msec Right Ventricle:  Amplitude: 4.5 mV, Impedance: 597 ohms, Threshold: 1.2 V at 0.4 msec Shock Impedance: 39 ohms   Episodes MS Episodes:  653     Percent Mode Switch:  0%     Coumadin:  Yes Shock:  0     ATP:  653     Nonsustained:  452     Atrial Pacing:  99%     Ventricular Pacing:  98%  Brady Parameters Mode DDDR     Lower Rate Limit:  60     Upper Rate Limit 110 PAV 180     Sensed AV Delay:  300  Tachy Zones VF:  200     VT:  180     VT1:  135     Next Remote Date:  11/09/2009     Next Cardiology Appt Due:  07/15/2010 Tech Comments:  No parameter changes.  No VT episodes since June.  The longest A-fib episode was 3:24 minutes.  Latitude transmissions every 3 months. ROV 1 year with Dr. Caryl Comes. Alma Friendly, LPN  August 11, 1515 61:60 AM   Impression & Recommendations:  Problem # 1:  VENTRICULAR TACHYCARDIA (ICD-427.1) Patient has had no intercurrent ventricular tachycardia. Will plan to decrease his amiodarone from 400-300 mg a day His updated medication list for this problem includes:    Nitroquick 0.4 Mg Subl (Nitroglycerin) .[name]... Take by mouth as needed    Low-dose Aspirin 81 Mg Tabs (Aspirin) .[name]... Take 1 tablet by mouth  once a day    Mexiletine Hcl 150 Mg Caps (Mexiletine hcl) .[name]... Take 2 tabs by mouth two times a day    Amiodarone Hcl 200 Mg Tabs (Amiodarone hcl) .[name]... Take 1 and 1/2 tablets daily.    Coumadin 5 Mg Tabs (Warfarin  sodium) .[name]... Take as directed by coumadin clinic.    Carvedilol 25 Mg Tabs (Carvedilol) .[name]... 1 1/2 tab by mouth two times a day.....Marland Kitchenequals 37.62m two times a day  Problem # 2:  SYSTOLIC HEART FAILURE, CHRONIC (ICD-428.22) congestive symptoms are stable we'll continue him on his current medications. His updated medication list for this problem includes:    Furosemide 40 Mg Tabs (Furosemide) ..[name].. Take 1 tablet by mouth once a day    Nitroquick 0.4 Mg Subl (Nitroglycerin) ..[name].. Take by mouth as needed    Low-dose Aspirin 81 Mg Tabs (Aspirin) ..[name].. Take 1 tablet by mouth once a day    Mexiletine Hcl 150 Mg Caps (Mexiletine hcl) ..[name].. Take 2 tabs by mouth two times a day    Diovan 80 Mg Tabs (Valsartan) ..... One tablet by mouth once daily    Amiodarone Hcl 200 Mg Tabs (Amiodarone hcl) ..[name].. Take 1 and 1/2 tablets daily.    Coumadin 5 Mg Tabs (Warfarin sodium) ..[name].. Take as directed by coumadin clinic.    Carvedilol 25 Mg Tabs (Carvedilol) ..[name].. 1 1/2 tab by mouth two times a day......Marland Kitchenquals 37.569mtwo times a day  Problem # 3:  CARDIOMYOPATHY, ISCHEMIC (ICD-414.8) without active chest pain continue current meds His updated medication list for this problem includes:    Furosemide 40 Mg Tabs (Furosemide) ...[name]. Take 1 tablet by mouth once a day    Nitroquick 0.4 Mg Subl (Nitroglycerin) ...[name]. Take by mouth as needed    Low-dose Aspirin 81 Mg Tabs (Aspirin) ...[name]. Take 1 tablet by mouth once a day    Mexiletine Hcl 150 Mg Caps (Mexiletine hcl) ...[name]. Take 2 tabs by mouth two times a day    Diovan 80 Mg Tabs (Valsartan) ..... One tablet by mouth once daily    Amiodarone Hcl 200 Mg Tabs (Amiodarone hcl) ...[name]. Take 1 and 1/2 tablets daily.    Coumadin 5 Mg Tabs (Warfarin sodium) ...[name].  Take as directed by coumadin clinic.    Carvedilol 25 Mg Tabs (Carvedilol) ...[name]. 1 1/2 tab by mouth two times a day......eMarland Kitchenuals 37.29m71mwo times a day  Problem # 4:  ICD - CRT LV PORT PLUGGED GDT (ICD-V45.02) Device parameters and data were reviewed and no changes were made  Patient Instructions: 1)  Your physician has recommended you make the following change in your medication: Decrease Amiodarone to 300m7mily. 2)  Your physician recommends that you schedule a follow-up appointment in: 2 months with Dr KleiCaryl Comesescriptions: AMIODARONE HCL 200 MG TABS (AMIODARONE HCL) Take 1 and 1/2 tablets daily.  #45 x 3   Entered by:   AmbeAdvertising account executive   Authorized by:   StevNikki Dom, FACCMercy Hospitaligned by:   AmbeChanetta MarshallBSN on 08/10/2009   Method used:   [facility] [id number]       [address]       [place]  [id number]   Ph: [phone]   Fax: [phone]:   [phone]

## 2010-02-15 NOTE — Assessment & Plan Note (Signed)
Summary: appt at 4:30   Primary Provider:  Burnett Harry  CC:  Pt reports no energy and sweating.  He feels like his heart rate has been irratic.  [name]  History of Present Illness: [name] is a is seen in followup for ventricular tachycardia in this item ischemic and nonischemic cardiomyopathy. He has been treated with a combination of mexiletine and amiodarone without clinical recurrences. Most recent ejection fraction by catheterization was 20% and Myoview June 2010 showed prior inferior infarct no ischemia and ejection fraction of 20%   last week he developed a GI syndrome with nausea and vomiting. he continues to take his diuretics. This happened about 2 weeks ago. He has had days where is not felt well since then. There have not been cleared good and bad days though.  There is been no chest pain or shortness of breath until these last 2 weeks. There is no peripheral edema  Current Medications (verified): 1)  Furosemide 40 Mg Tabs (Furosemide) .... Take 1 Tablet By Mouth Once A Day 2)  Nitroquick 0.4 Mg Subl (Nitroglycerin) .... Take By Mouth As Needed 3)  Saw Palmetto 450 Mg  Caps (Saw Palmetto (Serenoa Repens)) .... Take 1 Tablet By Mouth Two Times A Day 4)  Multivitamins   Caps (Multiple Vitamin) .... Take 1 Tablet By Mouth Once A Day 5)  Low-Dose Aspirin 81 Mg  Tabs (Aspirin) .... Take 1 Tablet By Mouth Once A Day 6)  Mexiletine Hcl 150 Mg  Caps (Mexiletine Hcl) .... Take 2 Tabs By Mouth Two Times A Day 7)  Diovan 80 Mg Tabs (Valsartan) .... One Tablet By Mouth Once Daily-Pt Has Not Started This Medication As of Yet.  He Will Start March 8)  Amiodarone Hcl 200 Mg  Tabs (Amiodarone Hcl) .... Take 1 Tablet By Mouth  Daily 9)  Crestor 20 Mg Tabs (Rosuvastatin Calcium) .... Take One Tablet By Mouth Daily. 10)  Coumadin 5 Mg Tabs (Warfarin Sodium) .... Take As Directed By Coumadin Clinic. 11)  Carvedilol 25 Mg Tabs (Carvedilol) .[name].. 1 1/2 Tab By Mouth Two Times A Day.....Marland Kitchenequals 37.53m Two  Times A Day 12)  Gnp Flax Seed Oil 1000 Mg Caps (Flaxseed (Linseed)) .... Take 1 Capsule By Mouth Two Times A Day 13)  Nexium 40 Mg  Cpdr (Esomeprazole Magnesium) .... Take 1 Tablet By Mouth Once A Day 14)  Dulcolax Stool Softener 100 Mg Caps (Docusate Sodium) ..[name]. 1 Tab By Mouth Two Times A Day 15)  Preservision/lutein  Caps (Multiple Vitamins-Minerals) ..[name]. 1 Tab By Mouth Once Daily 16)  Caltrate 600 1500 Mg Tabs (Calcium Carbonate) ..[name]. 1 Tab By Mouth Once Daily 17)  Veramyst 27.5 Mcg/spray  Susp (Fluticasone Furoate) ..[name]. 1 Puff in Each Nostril Once Daily 18)  Xyzal 5 Mg  Tabs (Levocetirizine Dihydrochloride) .... By Mouth Daily  Allergies (verified): 1)  ! Sulfa  Past History:  Past Medical History: Last updated: 11/11/2008 ICD - IN SITU (ICD-V45.02) ATRIAL FIBRILLATION, HX OF (ICD-V12.59) CARDIOMYOPATHY (ICD-425.4) HYPERTENSION (ICD-401.9) HYPERLIPIDEMIA (ICD-272.4) ALLERGIC RHINITIS CAUSE UNSPECIFIED (ICD-477.9) OBSTRUCTIVE SLEEP APNEA (ICD-327.23) Coronary Artery Disease Ventricular tachycardia    Past Surgical History: Last updated: 05/09/2008 ablation ICD - Guidant Contak H170 - [place] Sprang MD - 07/25/06 (2nd device)  Family History: Last updated: 05/09/2008 heart disease: mother cancer: maternal grandmoter (breast)  Family History of Coronary Artery Disease:  Family History of Hypertension:   Social History: Last updated: 05/09/2008 Patient states former smoker.  pt is married. pt has children. pt is  retired. Pt owned a company.  Alcohol Use - yes - socially  Vital Signs:  Patient profile:   70 year old male Height:      72 inches Weight:      251 pounds BMI:     34.16 Pulse rate:   104 / minute Pulse rhythm:   irregular BP sitting:   114 / 89  (left arm) Cuff size:   regular  Vitals Entered By: Doug Sou CMA (May 26, 2009 4:51 PM)  Physical Exam  General:  The patient was alert and oriented in no acute distress. HEENT Normal.  Neck  veins were flat, carotids were brisk.  Lungs were clear.  Heart sounds were regular a rapid without murmurs or gallops.  Abdomen was soft with active bowel sounds. There is no clubbing cyanosis or edema. Skin Warm and dry     ICD Specifications Following MD:  Virl Axe, MD     Referring MD:  Stanford Breed ICD Vendor:  [place] Number:  [id number]     ICD Serial Number:  [id number] ICD DOI:  07/25/2006     ICD Implanting MD:  Virl Axe, MD  Lead 1:    Location: RA     DOI: 06/09/2000     Model #: [id number]     Serial #: [id number]     Status: active Lead 2:    Location: RV     DOI: 06/09/2000     Model #: [id number]     Serial #: [id number]     Status: active  Indications::  ICM   ICD Follow Up ICD Dependent:  No      Episodes Coumadin:  Yes  Brady Parameters Mode DDDR     Lower Rate Limit:  60     Upper Rate Limit 110 PAV 180     Sensed AV Delay:  300  Tachy Zones VF:  200     VT:  180     VT1:  135     Impression & Recommendations:  Problem # 1:  VENTRICULAR TACHYCARDIA (ICD-427.1) patient had ventricular tachycardia on arrival. Antitachycardia pacing was delivered via his ICD to terminate ventricular tachycardia. Interrogation of the device does not allow for ready detection of the onset of VT  because other it's very slow rate. Amplitude variation on his RV electrogram suggests that there may have been intermittent VT for the last week as opposed to a continuous episode of VT.  His device was reprogrammed to allow for ATP to be delivered up to a cycle length of 600. Unfortunately we had to decrease his max sensor rate to 630 ms to avoid overlap. We attempted to compensate for the impact on heart rate excursion by making a heart rate response our them more aggressive.  need to check his metabolic profile to look to see if there are likely disturbances related to his GI disorder that might have prompted his vt EKG w/ Interpretation (93000)  Problem # 2:  ATRIAL FIBRILLATION,  HX OF (ICD-V12.59) stable His updated medication list for this problem includes:    Nitroquick 0.4 Mg Subl (Nitroglycerin) .[name]... Take by mouth as needed    Low-dose Aspirin 81 Mg Tabs (Aspirin) .[name]... Take 1 tablet by mouth once a day    Mexiletine Hcl 150 Mg Caps (Mexiletine hcl) .[name]... Take 2 tabs by mouth two times a day    Amiodarone Hcl 200 Mg Tabs (Amiodarone hcl) .[name]... Take 1 tablet by mouth  daily    Coumadin 5 Mg Tabs (Warfarin sodium) .[name]... Take as directed by coumadin clinic.    Carvedilol 25 Mg Tabs (Carvedilol) .[name]... 1 1/2 tab by mouth two times a day.....Marland Kitchenequals 37.65m two times a day  Problem # 3:  CARDIOMYOPATHY, ISCHEMIC (ICD-414.8) continue on his current medication  Problem # 4:  ICD - CRT LV PORT PLUGGED GDT (ICD-V45.02) Device parameters and data were reviewed and changes were made as outlined previously  Problem # 5:  SYSTOLIC HEART FAILURE, CHRONIC (ICD-428.22) his recent decompensation I trust will be attributable to his ventricular tachycardia and recovery associated with its suppression  Patient Instructions: 1)  Your physician recommends that you return for lab work on Monday (BMP, Mg Dx 427.1) 2)  Your physician recommends that you schedule a follow-up appointment in: 4 weeks with Dr KCaryl Comes

## 2010-02-15 NOTE — Progress Notes (Signed)
Summary: PT D/C TODAY AND NEEDS RX CALLED IN  Phone Note Call from Patient Call back at [phone]   Caller: Patient Reason for Call: Talk to Nurse, Talk to Doctor Complaint: Chest Pain Summary of Call: PT WANTS [facility] [address] [address] Initial call taken by: Shelda Pal,  December 13, 2009 10:04 AM  Follow-up for Phone Call        Left message to call back Fredia Beets, RN  December 13, 2009 10:14 AM PT AWARE. MED SENT  VIA EMR. Follow-up by: Devra Dopp, LPN,  December 14, 154 4:28 PM    New/Updated Medications: FUROSEMIDE 40 MG TABS (FUROSEMIDE) 1 two times a day LACTULOSE 10 GM/15ML SOLN (LACTULOSE) TAKE AS DIRECTED Prescriptions: FUROSEMIDE 40 MG TABS (FUROSEMIDE) 1 two times a day  #60 x 11   Entered by:   Devra Dopp, LPN   Authorized by:   Colin Mulders, MD, [facility]   Signed by:   Devra Dopp, LPN on 15/37/9432   Method used:   [facility]        [facility]  [address] [id number]       [address]       [place], [place]  [id number]       Ph: [phone] or [phone]       Fax: [phone]   RxID:   [id number] LACTULOSE 10 GM/15ML SOLN (LACTULOSE) TAKE AS DIRECTED  #1 x 1   Entered by:   Devra Dopp, LPN   Authorized by:   Colin Mulders, MD, [facility]   Signed by:   Devra Dopp, LPN on 59/09/3110   Method used:   [facility]        [facility]  [address] [id number]       [address]       [place], [place]  [address]       Ph: [phone] or [phone]       Fax: [phone]   RxID:   [id number]

## 2010-02-15 NOTE — Medication Information (Signed)
Summary: rov/mw  Anticoagulant Therapy  Managed by: Porfirio Oar, PharmD Referring MD: Kirk Ruths MD PCP: Theodoro Kalata MD: Ron Parker MD, Dellis Filbert Indication 1: Atrial Fibrillation (ICD-427.31) Lab Used: [facility] Site: [facility] INR POC 2.1 INR RANGE 2 - 3  Dietary changes: yes       Details: Not as many greens  Health status changes: no    Bleeding/hemorrhagic complications: no    Recent/future hospitalizations: no    Any changes in medication regimen? no    Recent/future dental: no  Any missed doses?: yes     Details: Possibly missed 1/2 tablet 2-3 days ago while on vacation  Is patient compliant with meds? yes       Allergies: 1)  ! Sulfa  Anticoagulation Management History:      The patient is taking warfarin and comes in today for a routine follow up visit.  Positive risk factors for bleeding include an age of 68 years or older.  The bleeding index is 'intermediate risk'.  Positive CHADS2 values include History of CHF and History of HTN.  Negative CHADS2 values include Age > 53 years old.  The start date was 10/20/2002.  His last INR was 2.5.  Anticoagulation responsible provider: Ron Parker MD, Dellis Filbert.  INR POC: 2.1.  Cuvette Lot#: [id number].  Exp: 11/15/2010.    Anticoagulation Management Assessment/Plan:      The patient's current anticoagulation dose is Coumadin 5 mg tabs: Take as directed by coumadin clinic..  The target INR is 2 - 3.  The next INR is due 10/25/2009.  Anticoagulation instructions were given to patient.  Results were reviewed/authorized by Porfirio Oar, PharmD.  He was notified by Sharol Harness, PharmD candidate.         Prior Anticoagulation Instructions: INR 1.9 Continue taking current regimen of 1 tablet on monday, wednesday, and friday. Take 0.5 tablets the other days.  Will Recheck in 2 weeks. See you then.  Current Anticoagulation Instructions: INR 2.1  Continue taking 1/2 tablet everyday except take 1 tablet on Mondays, Wednesdays, and  Fridays. Re-check INR in 4 weeks.

## 2010-02-15 NOTE — Medication Information (Signed)
Summary: rov/eac  Anticoagulant Therapy  Managed by: Roxanne Gates, PharmD Referring MD: Kirk Ruths MD PCP: Theodoro Kalata MD: Olevia Perches MD, Sherard Sutch Indication 1: Atrial Fibrillation (ICD-427.31) Lab Used: [facility] [place] Site: [facility] INR POC 2.5 INR RANGE 2 - 3  Dietary changes: yes       Details: Has been at the beach - about half of the greens he usually eats  Health status changes: no    Bleeding/hemorrhagic complications: no    Recent/future hospitalizations: no    Any changes in medication regimen? no    Recent/future dental: no  Any missed doses?: no       Is patient compliant with meds? yes       Current Medications (verified): 1)  Furosemide 40 Mg Tabs (Furosemide) .... Take 1 Tablet By Mouth Once A Day 2)  Nitroquick 0.4 Mg Subl (Nitroglycerin) .... Take By Mouth As Needed 3)  Saw Palmetto 450 Mg  Caps (Saw Palmetto (Serenoa Repens)) .... Take 1 Tablet By Mouth Two Times A Day 4)  Multivitamins   Caps (Multiple Vitamin) .... Take 1 Tablet By Mouth Once A Day 5)  Low-Dose Aspirin 81 Mg  Tabs (Aspirin) .... Take 1 Tablet By Mouth Once A Day 6)  Mexiletine Hcl 150 Mg  Caps (Mexiletine Hcl) .... Take 2 Tabs By Mouth Two Times A Day 7)  Diovan 80 Mg Tabs (Valsartan) .... One Tablet By Mouth Once Daily-Pt Has Not Started This Medication As of Yet.  He Will Start March 8)  Amiodarone Hcl 200 Mg  Tabs (Amiodarone Hcl) .... Take 1 Tablet By Mouth  Daily 9)  Crestor 20 Mg Tabs (Rosuvastatin Calcium) .... Take One Tablet By Mouth Daily. 10)  Coumadin 5 Mg Tabs (Warfarin Sodium) .... Take As Directed By Coumadin Clinic. 11)  Carvedilol 25 Mg Tabs (Carvedilol) .[name].. 1 Tab By Mouth Two Times A Day With 12.5 Mg Two Times A Day To Equal 37.5 Two Times A Day 12)  Gnp Flax Seed Oil 1000 Mg Caps (Flaxseed (Linseed)) .... Take 1 Capsule By Mouth Two Times A Day 13)  Nexium 40 Mg  Cpdr (Esomeprazole Magnesium) .... Take 1 Tablet By Mouth Once A Day 14)  Dulcolax Stool Softener  100 Mg Caps (Docusate Sodium) .[name].. 1 Tab By Mouth Two Times A Day 15)  Preservision/lutein  Caps (Multiple Vitamins-Minerals) .[name].. 1 Tab By Mouth Once Daily 16)  Caltrate 600 1500 Mg Tabs (Calcium Carbonate) .[name].. 1 Tab By Mouth Once Daily 17)  Veramyst 27.5 Mcg/spray  Susp (Fluticasone Furoate) .[name].. 1 Puff in Each Nostril Once Daily 18)  Xyzal 5 Mg  Tabs (Levocetirizine Dihydrochloride) .... By Mouth Daily  Allergies (verified): 1)  ! Sulfa  Anticoagulation Management History:      The patient is taking warfarin and comes in today for a routine follow up visit.  Positive risk factors for bleeding include an age of 21 years or older.  The bleeding index is 'intermediate risk'.  Positive CHADS2 values include History of HTN.  Negative CHADS2 values include Age > 87 years old.  The start date was 10/20/2002.  His last INR was 3.1 RATIO and today's INR is 2.5.  Anticoagulation responsible provider: Olevia Perches MD, Darnell Level.  INR POC: 2.5.  Cuvette Lot#: [id number].  Exp: 06/2010.    Anticoagulation Management Assessment/Plan:      The patient's current anticoagulation dose is Coumadin 5 mg tabs: Take as directed by coumadin clinic..  The target INR is 2 - 3.  The next  INR is due 05/29/2009.  Anticoagulation instructions were given to patient.  Results were reviewed/authorized by Roxanne Gates, PharmD.  He was notified by Roxanne Gates.         Prior Anticoagulation Instructions: INR 2.7  Continue taking 1/2 tablet on Monday, Wednesday, and Friday and 1 tablet all other days.  Return to clinic in 4 weeks.   Current Anticoagulation Instructions: INR = 2.5 (goal = 2-3)  The patient is to continue with the same dose of coumadin.  This dosage includes: 1 tablet on all days except for MWF take 1/5 tablet

## 2010-02-15 NOTE — Procedures (Signed)
Summary: wound [email or web address]   Current Medications (verified): 1)  Furosemide 40 Mg Tabs (Furosemide) .[name].. 1 Two Times A Day 2)  Nitroquick 0.4 Mg Subl (Nitroglycerin) .... Take By Mouth As Needed 3)  Saw Palmetto 450 Mg  Caps (Saw Palmetto (Serenoa Repens)) .... Take 1 Tablet By Mouth Two Times A Day 4)  Multivitamins   Caps (Multiple Vitamin) .... Take 1 Tablet By Mouth Once A Day 5)  Low-Dose Aspirin 81 Mg  Tabs (Aspirin) .... Take 1 Tablet By Mouth Once A Day 6)  Mexiletine Hcl 150 Mg  Caps (Mexiletine Hcl) .... Take 2 Tabs By Mouth Two Times A Day 7)  Diovan 80 Mg Tabs (Valsartan) .... One Tablet By Mouth Once Daily 8)  Amiodarone Hcl 200 Mg Tabs (Amiodarone Hcl) .... Take 1 and 1/2 Tablets Daily. 9)  Crestor 20 Mg Tabs (Rosuvastatin Calcium) .... Take One Tablet By Mouth Daily. 10)  Coumadin 5 Mg Tabs (Warfarin Sodium) .... Take As Directed By Coumadin Clinic. 11)  Carvedilol 12.5 Mg Tabs (Carvedilol) .... Take 1 and 1/2 Tablets  By Mouth Two Times A Day 12)  Gnp Flax Seed Oil 1000 Mg Caps (Flaxseed (Linseed)) .... Take 1 Capsule By Mouth Two Times A Day 13)  Dulcolax Stool Softener 100 Mg Caps (Docusate Sodium) .[name].. 1 Tab By Mouth Two Times A Day 14)  Preservision/lutein  Caps (Multiple Vitamins-Minerals) .[name].. 1 Tab By Mouth Once Daily 15)  Align  Caps (Probiotic Product) .... Take One Capsule Daily 16)  Xyzal 5 Mg Tabs (Levocetirizine Dihydrochloride) .... Once Daily 17)  Lactulose 10 Gm/76m Soln (Lactulose) .... Take As Directed 18)  Caltrate 600 1500 Mg Tabs (Calcium Carbonate) .... Take 1 Tablet By Mouth Two Times A Day 19)  Klor-Con M20 20 Meq Cr-Tabs (Potassium Chloride Crys Cr) .... Take 1 Tablet By Mouth Once Daily 20)  Veramyst 27.5 Mcg/spray Susp (Fluticasone Furoate) .... Spray 2 Puffs in Each Nostril Nasally Once Daily  Allergies (verified): 1)  ! Sulfa     ICD Specifications Following MD:  SVirl Axe MD     Referring MD:  CStanford BreedICD Vendor:  BHayti HeightsNumber:  [phone]    ICD Serial Number:  [id number] DOI:  12/20/2009     ICD Implanting MD:  SVirl Axe MD  Lead 1:    Location: RA     DOI: 06/09/2000     Model #: [id number]    Serial #: [id number]     Status: active Lead 2:    Location: RV     DOI: 06/09/2000     Model #: [id number]    Serial #: [id number]    Status: active Lead 3:    Location: LV     DOI: 12/20/2009     Model #: [id number]    Serial #: [id number]    Status: active  Indications::  ICM  Explantation Comments: 12/20/09 [employer] [id number] explanted  ICD Follow Up Battery Voltage:  GOOD V     Charge Time:  8.1 seconds     Battery Est. Longevity:  8 YRS Underlying rhythm:  SB ICD Dependent:  No       ICD Device Measurements Atrium:  Amplitude: 3.3 mV, Impedance: 534 ohms, Threshold: 0.6 V at 0.4 msec Right Ventricle:  Amplitude: 6.2 mV, Impedance: 644 ohms, Threshold: 1.1 V at 0.4 msec Left Ventricle:  Amplitude: 18.6 mV, Impedance: 1261 ohms, Threshold: 1.4 V at  0.8 msec Shock Impedance: 49 ohms   Episodes MS Episodes:  1     Coumadin:  Yes Shock:  0     ATP:  0     Nonsustained:  0     Atrial Therapies:  0 Atrial Pacing:  90%     Ventricular Pacing:  99%  Brady Parameters Mode DDDR     Lower Rate Limit:  60     Upper Rate Limit 110 PAV 180     Sensed AV Delay:  300  Tachy Zones VF:  200     VT:  180     VT1:  100     Next Cardiology Appt Due:  01/30/2010 Tech Comments:  WOUND CHECK---STERI STRIPS REMOVED.  NO REDNESS OR SWELLING AT SITE.  NORMAL DEVICE FUNCTION.  NO CHANGES MADE. ROV 01-30-10 @ 1110 W/SK. Shelly Bombard  January 01, 2010 5:15 PM

## 2010-02-15 NOTE — Letter (Signed)
Summary: Remote Device Check  [facility] [place]  [address] [place]   [place], [place] [address]   Phone: [phone]  Fax: [phone]     January 16, 2009 MRN: [id number]   [name] [address] [place], [place]  [address]   Dear [name],   Your remote transmission was recieved and reviewed by your physician.  All diagnostics were within normal limits for you.    ___X___Your next office visit is scheduled for:    MARCH 2011 [place]. Please call our office to schedule an appointment.    Sincerely,  Hotel manager

## 2010-02-15 NOTE — Medication Information (Signed)
Summary: rov/jaj  Anticoagulant Therapy  Managed by: Gypsy Lore, PharmD Referring MD: Kirk Ruths MD PCP: Theodoro Kalata MD: Verl Blalock MD, Marcello Moores Indication 1: Atrial Fibrillation (ICD-427.31) Lab Used: [facility] Site: [facility] INR POC 3.1 INR RANGE 2 - 3  Dietary changes: yes       Details: Has eaten less leafy greens than usual.  Health status changes: no    Bleeding/hemorrhagic complications: no    Recent/future hospitalizations: no    Any changes in medication regimen? no    Recent/future dental: no  Any missed doses?: yes     Details: Missed one dose a couple of weeks ago.   Is patient compliant with meds? yes       Current Medications (verified): 1)  Furosemide 40 Mg Tabs (Furosemide) .... Take 1 Tablet By Mouth Once A Day 2)  Nitroquick 0.4 Mg Subl (Nitroglycerin) .... Take By Mouth As Needed 3)  Saw Palmetto 450 Mg  Caps (Saw Palmetto (Serenoa Repens)) .... Take 1 Tablet By Mouth Two Times A Day 4)  Multivitamins   Caps (Multiple Vitamin) .... Take 1 Tablet By Mouth Once A Day 5)  Low-Dose Aspirin 81 Mg  Tabs (Aspirin) .... Take 1 Tablet By Mouth Once A Day 6)  Mexiletine Hcl 150 Mg  Caps (Mexiletine Hcl) .... Take 2 Tabs By Mouth Two Times A Day 7)  Diovan 80 Mg Tabs (Valsartan) .... One Tablet By Mouth Once Daily 8)  Amiodarone Hcl 200 Mg Tabs (Amiodarone Hcl) .... Take 1 and 1/2 Tablets Daily. 9)  Crestor 20 Mg Tabs (Rosuvastatin Calcium) .... Take One Tablet By Mouth Daily. 10)  Coumadin 5 Mg Tabs (Warfarin Sodium) .... Take As Directed By Coumadin Clinic. 11)  Carvedilol 25 Mg Tabs (Carvedilol) .[name].. 1 1/2 Tab By Mouth Two Times A Day.....Marland Kitchenequals 37.22m Two Times A Day 12)  Gnp Flax Seed Oil 1000 Mg Caps (Flaxseed (Linseed)) .... Take 1 Capsule By Mouth Two Times A Day 13)  Dulcolax Stool Softener 100 Mg Caps (Docusate Sodium) ..[name]. 1 Tab By Mouth Two Times A Day 14)  Preservision/lutein  Caps (Multiple Vitamins-Minerals) ..[name]. 1 Tab By Mouth Once  Daily 15)  Align  Caps (Probiotic Product) .... Take One Capsule Daily 16)  Xyzal 5 Mg Tabs (Levocetirizine Dihydrochloride) .... Once Daily  Allergies (verified): 1)  ! Sulfa  Anticoagulation Management History:      The patient is taking warfarin and comes in today for a routine follow up visit.  Positive risk factors for bleeding include an age of 89years or older.  The bleeding index is 'intermediate risk'.  Positive CHADS2 values include History of CHF and History of HTN.  Negative CHADS2 values include Age > 746years old.  The start date was 10/20/2002.  His last INR was 2.5.  Anticoagulation responsible provider: WVerl BlalockMD, TMarcello Moores  INR POC: 3.1.  Cuvette Lot#: [id number]  Exp: 11/15/2010.    Anticoagulation Management Assessment/Plan:      The patient's current anticoagulation dose is Coumadin 5 mg tabs: Take as directed by coumadin clinic..  The target INR is 2 - 3.  The next INR is due 11/22/2009.  Anticoagulation instructions were given to patient.  Results were reviewed/authorized by SGypsy Lore PharmD.  He was notified by SGypsy LorePharmD.         Prior Anticoagulation Instructions: INR 2.1  Continue taking 1/2 tablet everyday except take 1 tablet on Mondays, Wednesdays, and Fridays. Re-check INR in 4 weeks.   Current Anticoagulation  Instructions: INR 3.1  Today, Wednesday, October 13th, take Coumadin 0.5 tab (2.5 mg). Then, resume taking Coumadin 0.5 tab (2.5 mg) on Sun, Tues, Thur, Sat and Coumadin 1 tab (5 mg) on Mon, Wed, Fri. Return to clinic in 4 weeks.

## 2010-02-15 NOTE — Medication Information (Signed)
Summary: [name]  Anticoagulant Therapy  Managed by: Alinda Deem, PharmD, BCPS, CPP Referring MD: Kirk Ruths MD PCP: Theodoro Kalata MD: Haroldine Laws MD, Quillian Quince Indication 1: Atrial Fibrillation (ICD-427.31) Lab Used: [facility] Site: [facility] INR POC 2.3 INR RANGE 2 - 3  Dietary changes: no    Health status changes: no    Bleeding/hemorrhagic complications: no    Recent/future hospitalizations: no    Any changes in medication regimen? no    Recent/future dental: no  Any missed doses?: no       Is patient compliant with meds? yes      Comments: Will have cysto on 2/7 and has been instructed to hold warfarin for 2 days on 2/5 and 2/6.    Current Medications (verified): 1)  Furosemide 40 Mg Tabs (Furosemide) .... Take 1 Tablet By Mouth Once A Day 2)  Nitroquick 0.4 Mg Subl (Nitroglycerin) .... Take By Mouth As Needed 3)  Saw Palmetto 450 Mg  Caps (Saw Palmetto (Serenoa Repens)) .... Take 1 Tablet By Mouth Two Times A Day 4)  Multivitamins   Caps (Multiple Vitamin) .... Take 1 Tablet By Mouth Once A Day 5)  Low-Dose Aspirin 81 Mg  Tabs (Aspirin) .... Take 1 Tablet By Mouth Once A Day 6)  Mexiletine Hcl 150 Mg  Caps (Mexiletine Hcl) .... Take 2 Tabs By Mouth Two Times A Day 7)  Avapro 150 Mg  Tabs (Irbesartan) .... 1/2 Tab By Mouth Once Daily 8)  Amiodarone Hcl 200 Mg  Tabs (Amiodarone Hcl) .... Take 1 Tablet By Mouth  Daily 9)  Crestor 20 Mg Tabs (Rosuvastatin Calcium) .... Take One Tablet By Mouth Daily. 10)  Coumadin 5 Mg Tabs (Warfarin Sodium) .... Take As Directed By Coumadin Clinic. 11)  Carvedilol 25 Mg Tabs (Carvedilol) .[name].. 1 Tab By Mouth Two Times A Day With 12.5 Mg Two Times A Day To Equal 37.5 Two Times A Day 12)  Gnp Flax Seed Oil 1000 Mg Caps (Flaxseed (Linseed)) .... Take 1 Capsule By Mouth Two Times A Day 13)  Nexium 40 Mg  Cpdr (Esomeprazole Magnesium) .... Take 1 Tablet By Mouth Once A Day 14)  Dulcolax Stool Softener 100 Mg Caps (Docusate Sodium)  .[name].. 1 Tab By Mouth Two Times A Day 15)  Preservision/lutein  Caps (Multiple Vitamins-Minerals) .[name].. 1 Tab By Mouth Once Daily 16)  Caltrate 600 1500 Mg Tabs (Calcium Carbonate) .[name].. 1 Tab By Mouth Once Daily 17)  Veramyst 27.5 Mcg/spray  Susp (Fluticasone Furoate) .[name].. 1 Puff in Each Nostril Once Daily 18)  Zyrtec Allergy 10 Mg Tabs (Cetirizine Hcl) .... Take 1 Tablet By Mouth Once A Day 19)  Xyzal 5 Mg  Tabs (Levocetirizine Dihydrochloride) .... By Mouth Daily  Allergies (verified): 1)  ! Sulfa  Anticoagulation Management History:      The patient is taking warfarin and comes in today for a routine follow up visit.  Positive risk factors for bleeding include an age of 69 years or older.  The bleeding index is 'intermediate risk'.  Positive CHADS2 values include History of HTN.  Negative CHADS2 values include Age > 30 years old.  The start date was 10/20/2002.  His last INR was 3.1 RATIO.  Anticoagulation responsible provider: Bensimhon MD, Quillian Quince.  INR POC: 2.3.  Cuvette Lot#: [id number].  Exp: 04/2010.    Anticoagulation Management Assessment/Plan:      The patient's current anticoagulation dose is Coumadin 5 mg tabs: Take as directed by coumadin clinic..  The target INR is  2 - 3.  The next INR is due 02/27/2009.  Anticoagulation instructions were given to patient.  Results were reviewed/authorized by Alinda Deem, PharmD, BCPS, CPP.  He was notified by Alinda Deem PharmD, BCPS, CPP.         Prior Anticoagulation Instructions: INR 1.9 Take extra 1/2 tablet ( total of 27m) today, then resume to 573mtablet daily except 2.81m80mn Mondays, Wednesdays, and Fridays Recheck in 3 weeks  Current Anticoagulation Instructions: INR 2.3  Take 0.5 tab each Monday, Wednesday, Friday and 1 tab on all other days.  Recheck in 10 - 14 days.

## 2010-02-15 NOTE — Progress Notes (Signed)
Summary: refill pt will be out today  Phone Note Refill Request Message from:  Patient on July 10, 2009 9:17 AM  Refills Requested: Medication #1:  AMIODARONE HCL 200 MG  TABS take 1 tablet two times a day Need a new refill  [facility] [phone] it need to be double mg  Initial call taken by: Delsa Sale,  July 10, 2009 9:18 AM  Follow-up for Phone Call       Follow-up by: Doug Sou CMA,  July 10, 2009 10:47 AM    New/Updated Medications: AMIODARONE HCL 400 MG TABS (AMIODARONE HCL) take one tablet once daily Prescriptions: AMIODARONE HCL 400 MG TABS (AMIODARONE HCL) take one tablet once daily  #30 x 6   Entered by:   Doug Sou CMA   Authorized by:   Nikki Dom, MD, [facility]   Signed by:   Doug Sou CMA on 07/10/2009   Method used:   Elect[facility]ically to        [id number]       [address]       [place], [place]  [address]       Ph: [phone] or [phone]       Fax: [phone]   RxID:   [id number]

## 2010-02-15 NOTE — Cardiovascular Report (Signed)
Summary: CRT-D Warranty Validation and Lead Registration   CRT-D Warranty Validation and Lead Registration   Imported By: Sallee Provencal 01/12/2010 16:15:44  _____________________________________________________________________  External Attachment:    Type:   Image     Comment:   External Document

## 2010-02-15 NOTE — Assessment & Plan Note (Signed)
Summary: device/saf   Primary Provider:  Burnett Harry  CC:  device check.  History of Present Illness: [name] is seen in followup for ventricular tachycardia occurring in the setting of ischemic heart disease with prior bypass surgery. He also has a history of paroxysmal atrial fibrillation. He is on oral anticoagulation therapy.  He denies intercurrent problems of chest pain; shortness of breath is stable. He recently underwent a Myoview scanning which demonstrated no ischemia.   Current Medications (verified): 1)  Furosemide 40 Mg Tabs (Furosemide) .... Take 1 Tablet By Mouth Once A Day 2)  Nitroquick 0.4 Mg Subl (Nitroglycerin) .... Take By Mouth As Needed 3)  Saw Palmetto 450 Mg  Caps (Saw Palmetto (Serenoa Repens)) .... Take 1 Tablet By Mouth Two Times A Day 4)  Multivitamins   Caps (Multiple Vitamin) .... Take 1 Tablet By Mouth Once A Day 5)  Low-Dose Aspirin 81 Mg  Tabs (Aspirin) .... Take 1 Tablet By Mouth Once A Day 6)  Mexiletine Hcl 150 Mg  Caps (Mexiletine Hcl) .... Take 2 Tabs By Mouth Two Times A Day 7)  Diovan 80 Mg Tabs (Valsartan) .... One Tablet By Mouth Once Daily-Pt Has Not Started This Medication As of Yet.  He Will Start March 8)  Amiodarone Hcl 200 Mg  Tabs (Amiodarone Hcl) .... Take 1 Tablet By Mouth  Daily 9)  Crestor 20 Mg Tabs (Rosuvastatin Calcium) .... Take One Tablet By Mouth Daily. 10)  Coumadin 5 Mg Tabs (Warfarin Sodium) .... Take As Directed By Coumadin Clinic. 11)  Carvedilol 25 Mg Tabs (Carvedilol) .[name].. 1 Tab By Mouth Two Times A Day With 12.5 Mg Two Times A Day To Equal 37.5 Two Times A Day 12)  Gnp Flax Seed Oil 1000 Mg Caps (Flaxseed (Linseed)) .... Take 1 Capsule By Mouth Two Times A Day 13)  Nexium 40 Mg  Cpdr (Esomeprazole Magnesium) .... Take 1 Tablet By Mouth Once A Day 14)  Dulcolax Stool Softener 100 Mg Caps (Docusate Sodium) .[name].. 1 Tab By Mouth Two Times A Day 15)  Preservision/lutein  Caps (Multiple Vitamins-Minerals) .[name].. 1 Tab By Mouth Once  Daily 16)  Caltrate 600 1500 Mg Tabs (Calcium Carbonate) .[name].. 1 Tab By Mouth Once Daily 17)  Veramyst 27.5 Mcg/spray  Susp (Fluticasone Furoate) .[name].. 1 Puff in Each Nostril Once Daily 18)  Zyrtec Allergy 10 Mg Tabs (Cetirizine Hcl) .... Take 1 Tablet By Mouth Once A Day 19)  Xyzal 5 Mg  Tabs (Levocetirizine Dihydrochloride) .... By Mouth Daily  Allergies (verified): 1)  ! Sulfa  Past History:  Past Medical History: Last updated: 11/11/2008 ICD - IN SITU (ICD-V45.02) ATRIAL FIBRILLATION, HX OF (ICD-V12.59) CARDIOMYOPATHY (ICD-425.4) HYPERTENSION (ICD-401.9) HYPERLIPIDEMIA (ICD-272.4) ALLERGIC RHINITIS CAUSE UNSPECIFIED (ICD-477.9) OBSTRUCTIVE SLEEP APNEA (ICD-327.23) Coronary Artery Disease Ventricular tachycardia    Past Surgical History: Last updated: 05/09/2008 ablation ICD - [employer] Contak H170 - Deboraha Sprang, MD - 07/25/06 (2nd device)  Family History: Last updated: 05/09/2008 heart disease: mother cancer: maternal grandmoter (breast)  Family History of Coronary Artery Disease:  Family History of Hypertension:   Social History: Last updated: 05/09/2008 Patient states former smoker.  pt is married. pt has children. pt is retired. Pt owned a company.  Alcohol Use - yes - socially  Vital Signs:  Patient profile:   70 year old male Height:      72 inches Weight:      265 pounds BMI:     36.07 Pulse rate:   60 / minute Pulse  rhythm:   regular BP sitting:   118 / 82  (left arm) Cuff size:   large  Vitals Entered By: Doug Sou CMA (March 14, 2009 10:37 AM)  Physical Exam  General:  The patient was alert and oriented in no acute distress. HEENT Normal.  Neck veins were flat, carotids were brisk.  Lungs were clear.  Heart sounds were regular without murmurs or gallops.  Abdomen was soft with active bowel sounds. There is no clubbing cyanosis or edema. Skin Warm and dry     ICD Specifications Following MD:  Virl Axe, MD     Referring MD:   Stanford Breed ICD Vendor:  [place] Number:  [id number]     ICD Serial Number:  [id number] ICD DOI:  07/25/2006     ICD Implanting MD:  Virl Axe, MD  Lead 1:    Location: RA     DOI: 06/09/2000     Model #: [id number]     Serial #: [id number]     Status: active Lead 2:    Location: RV     DOI: 06/09/2000     Model #: [id number]     Serial #: [id number]     Status: active  Indications::  ICM   ICD Follow Up Remote Check?  No Battery Voltage:  3.06 V     Charge Time:  6.4 seconds     Battery Est. Longevity:  BOL Underlying rhythm:  SR ICD Dependent:  No       ICD Device Measurements Atrium:  Amplitude: 2.3 mV, Impedance: 610 ohms, Threshold: 0.6 V at 0.4 msec Right Ventricle:  Amplitude: 4.4 mV, Impedance: 662 ohms, Threshold: 0.8 V at 0.4 msec  Episodes MS Episodes:  1     Percent Mode Switch:  0%     Coumadin:  Yes Shock:  0     ATP:  0     Nonsustained:  0     Atrial Pacing:  99%     Ventricular Pacing:  97%  Brady Parameters Mode DDDR     Lower Rate Limit:  60     Upper Rate Limit 110 PAV 180     Sensed AV Delay:  300  Tachy Zones VF:  200     VT:  180     VT1:  135     Next Remote Date:  06/15/2009     Next Cardiology Appt Due:  03/15/2010 Tech Comments:  No parameter changes.  Device function normal.  Latitude transmissions every 3 months.  ROV 1 year with Dr. Caryl Comes. Alma Friendly, LPN  March 15, 3566 61:68 AM   Impression & Recommendations:  Problem # 1:  ICD - CRT LV PORT PLUGGED GDT (ICD-V45.02) Device parameters and data were reviewed and no changes were made  Problem # 2:  VENTRICULAR TACHYCARDIA (ICD-427.1) The patient continues to have no evidence of recurrent ventricular tachycardia. He remains on amiodarone and mexiletine. We discussed down titrating his medications however, we'll hold steady for where we are right now. Amiodarone surveillance labs have been recently checked his LFTs and TSH were normal  Problem # 3:  ATRIAL FIBRILLATION, HX OF (ICD-V12.59) no  evidence of atrial fib with a His updated medication list for this problem includes:    Nitroquick 0.4 Mg Subl (Nitroglycerin) .[name]... Take by mouth as needed    Low-dose Aspirin 81 Mg Tabs (Aspirin) .[name]... Take 1 tablet by mouth once a day  Mexiletine Hcl 150 Mg Caps (Mexiletine hcl) .[name]... Take 2 tabs by mouth two times a day    Amiodarone Hcl 200 Mg Tabs (Amiodarone hcl) .[name]... Take 1 tablet by mouth  daily    Coumadin 5 Mg Tabs (Warfarin sodium) .[name]... Take as directed by coumadin clinic.    Carvedilol 25 Mg Tabs (Carvedilol) .[name]... 1 tab by mouth two times a day with 12.5 mg two times a day to equal 37.5 two times a day  Problem # 4:  CARDIOMYOPATHY, ISCHEMIC (ICD-414.8) stable. His updated medication list for this problem includes:    Furosemide 40 Mg Tabs (Furosemide) .[name]... Take 1 tablet by mouth once a day    Nitroquick 0.4 Mg Subl (Nitroglycerin) .[name]... Take by mouth as needed    Low-dose Aspirin 81 Mg Tabs (Aspirin) .[name]... Take 1 tablet by mouth once a day    Mexiletine Hcl 150 Mg Caps (Mexiletine hcl) .[name]... Take 2 tabs by mouth two times a day    Diovan 80 Mg Tabs (Valsartan) ..... One tablet by mouth once daily-pt has not started this medication as of yet.  he will start march    Amiodarone Hcl 200 Mg Tabs (Amiodarone hcl) .[name]... Take 1 tablet by mouth  daily    Coumadin 5 Mg Tabs (Warfarin sodium) .[name]... Take as directed by coumadin clinic.    Carvedilol 25 Mg Tabs (Carvedilol) .[name]... 1 tab by mouth two times a day with 12.5 mg two times a day to equal 37.5 two times a day  Patient Instructions: 1)  Your physician recommends that you schedule a follow-up appointment in: 12 months with Dr. Caryl Comes

## 2010-02-15 NOTE — Miscellaneous (Signed)
  Clinical Lists Changes  Observations: Added new observation of CARDIO MD: Cr. Crenshaw and Dr. Caryl Comes (02/06/2010 14:30) Added new observation of GASTROENT MD: Dr. Watt Climes (02/06/2010 14:30) Added new observation of PAST MED HX: ICD - IN SITU (ICD-V45.02) ATRIAL FIBRILLATION, HX OF (ICD-V12.59) CARDIOMYOPATHY (ICD-425.4) HYPERTENSION (ICD-401.9) HYPERLIPIDEMIA (ICD-272.4) ALLERGIC RHINITIS CAUSE UNSPECIFIED (ICD-477.9) OBSTRUCTIVE SLEEP APNEA (ICD-327.23) Coronary Artery Disease Ventricular tachycardia Adenomatous colon polyp (+diverticulosis) 07/2007   (02/06/2010 14:30) Added new observation of PRIMARY MD: Burnett Harry (02/06/2010 14:30)       Past History:  Past Medical History: ICD - IN SITU (ICD-V45.02) ATRIAL FIBRILLATION, HX OF (ICD-V12.59) CARDIOMYOPATHY (ICD-425.4) HYPERTENSION (ICD-401.9) HYPERLIPIDEMIA (ICD-272.4) ALLERGIC RHINITIS CAUSE UNSPECIFIED (ICD-477.9) OBSTRUCTIVE SLEEP APNEA (ICD-327.23) Coronary Artery Disease Ventricular tachycardia Adenomatous colon polyp (+diverticulosis) 07/2007    Care Coordination Cardiologist: Cr. Crenshaw and Dr. Caryl Comes Gastroenterologist: Dr. Watt Climes

## 2010-02-15 NOTE — Medication Information (Signed)
Summary: rov/sp  Anticoagulant Therapy  Managed by: Romeo Rabon, PharmD Referring MD: Kirk Ruths MD PCP: Theodoro Kalata MD: Haroldine Laws MD, Quillian Quince Indication 1: Atrial Fibrillation (ICD-427.31) Lab Used: [facility]  Site: [facility] INR POC 1.9 INR RANGE 2 - 3  Dietary changes: no    Health status changes: no    Bleeding/hemorrhagic complications: no    Recent/future hospitalizations: no    Any changes in medication regimen? no    Recent/future dental: no  Any missed doses?: no       Is patient compliant with meds? yes      Comments: May reduce amiodarone dose.  Patient will let us know.  Current Medications (verified): 1)  Furosemide 40 Mg Tabs (Furosemide) .... Take 1 Tablet By Mouth Once A Day 2)  Nitroquick 0.4 Mg Subl (Nitroglycerin) .... Take By Mouth As Needed 3)  Saw Palmetto 450 Mg  Caps (Saw Palmetto (Serenoa Repens)) .... Take 1 Tablet By Mouth Two Times A Day 4)  Multivitamins   Caps (Multiple Vitamin) .... Take 1 Tablet By Mouth Once A Day 5)  Low-Dose Aspirin 81 Mg  Tabs (Aspirin) .... Take 1 Tablet By Mouth Once A Day 6)  Mexiletine Hcl 150 Mg  Caps (Mexiletine Hcl) .... Take 2 Tabs By Mouth Two Times A Day 7)  Diovan 80 Mg Tabs (Valsartan) .... One Tablet By Mouth Once Daily 8)  Amiodarone Hcl 200 Mg Tabs (Amiodarone Hcl) .... Take 1 and 1/2 Tablets Daily. 9)  Crestor 20 Mg Tabs (Rosuvastatin Calcium) .... Take One Tablet By Mouth Daily. 10)  Coumadin 5 Mg Tabs (Warfarin Sodium) .... Take As Directed By Coumadin Clinic. 11)  Carvedilol 25 Mg Tabs (Carvedilol) .[name].. 1 1/2 Tab By Mouth Two Times A Day.....Marland Kitchenequals 37.59m Two Times A Day 12)  Gnp Flax Seed Oil 1000 Mg Caps (Flaxseed (Linseed)) .... Take 1 Capsule By Mouth Two Times A Day 13)  Dulcolax Stool Softener 100 Mg Caps (Docusate Sodium) ..[name]. 1 Tab By Mouth Two Times A Day 14)  Preservision/lutein  Caps (Multiple Vitamins-Minerals) ..[name]. 1 Tab By Mouth Once Daily 15)  Veramyst 27.5 Mcg/spray   Susp (Fluticasone Furoate) ..[name]. 1 Puff in Each Nostril Once Daily 16)  Align  Caps (Probiotic Product) .... Take One Capsule Daily  Allergies (verified): 1)  ! Sulfa  Anticoagulation Management History:      The patient is taking warfarin and comes in today for a routine follow up visit.  Positive risk factors for bleeding include an age of 89years or older.  The bleeding index is 'intermediate risk'.  Positive CHADS2 values include History of CHF and History of HTN.  Negative CHADS2 values include Age > 777years old.  The start date was 10/20/2002.  His last INR was 2.5.  Anticoagulation responsible provider: Bensimhon MD, DQuillian Quince  INR POC: 1.9.  Cuvette Lot#: [id number]  Exp: 10/15/2010.    Anticoagulation Management Assessment/Plan:      The patient's current anticoagulation dose is Coumadin 5 mg tabs: Take as directed by coumadin clinic..  The target INR is 2 - 3.  The next INR is due 08/23/2009.  Anticoagulation instructions were given to patient.  Results were reviewed/authorized by TRomeo Rabon PharmD.  He was notified by TRomeo Rabon PharmD.         Prior Anticoagulation Instructions: INR 2.5  Continue same dose of 1/2 tablet every day except 1 tablet on Sunday and Thursday.   Current Anticoagulation Instructions: Take 1 tablet today then take as  follows: 1 tablet MWF, 0.5 tablet all other days.

## 2010-02-15 NOTE — Assessment & Plan Note (Signed)
Summary: rov for osa, dyspnea   Primary Provider/Referring Provider:  Burnett Harry  CC:  Pt is here for a 1 year f/u appt. Pt has lost 34 lbs since last visit. Pt was recently in hosp d/t "bronchitis and heart failure." Pt states he does cough up light yellow colored sputum.  Pt states he wears his cpap machine every night. .  History of Present Illness: the pt comes in today for f/u of his known osa.  He is wearing cpap compliantly, and has lost significant weight since his last visit.  He has no issues with his mask or cpap pressure.  He feels he sleeps well, with adequate daytime alertness.  The pt was recently in the hospital for CHF, and found to have a significant CM by echo.  He has been diuresed, and is better.  The question was also raised whether he may have airways disease, and felt to have bronchitis.  Currently, the pt has a minimal cough, with scant discolored mucus.  He feels his sob is much improved with diuresis.  Current Medications (verified): 1)  Furosemide 40 Mg Tabs (Furosemide) .[name].. 1 Two Times A Day 2)  Nitroquick 0.4 Mg Subl (Nitroglycerin) .... Take By Mouth As Needed 3)  Saw Palmetto 450 Mg  Caps (Saw Palmetto (Serenoa Repens)) .... Take 1 Tablet By Mouth Two Times A Day 4)  Multivitamins   Caps (Multiple Vitamin) .... Take 1 Tablet By Mouth Once A Day 5)  Low-Dose Aspirin 81 Mg  Tabs (Aspirin) .... Take 1 Tablet By Mouth Once A Day 6)  Mexiletine Hcl 150 Mg  Caps (Mexiletine Hcl) .... Take 2 Tabs By Mouth Two Times A Day 7)  Diovan 80 Mg Tabs (Valsartan) .... One Tablet By Mouth Once Daily 8)  Amiodarone Hcl 200 Mg Tabs (Amiodarone Hcl) .... Take 1 and 1/2 Tablets Daily. 9)  Crestor 20 Mg Tabs (Rosuvastatin Calcium) .... Take One Tablet By Mouth Daily. 10)  Coumadin 5 Mg Tabs (Warfarin Sodium) .... Take As Directed By Coumadin Clinic. 11)  Coreg 25 Mg Tabs (Carvedilol) .... Take 1 1/2 Tabs By Mouth Two Times A Day 12)  Gnp Flax Seed Oil 1000 Mg Caps (Flaxseed (Linseed))  .... Take 1 Capsule By Mouth Two Times A Day 13)  Dulcolax Stool Softener 100 Mg Caps (Docusate Sodium) .[name].. 1 Tab By Mouth Two Times A Day 14)  Preservision/lutein  Caps (Multiple Vitamins-Minerals) .[name].. 1 Tab By Mouth Once Daily 15)  Align  Caps (Probiotic Product) .... Take One Capsule Daily 16)  Xyzal 5 Mg Tabs (Levocetirizine Dihydrochloride) .... Once Daily 17)  Caltrate 600 1500 Mg Tabs (Calcium Carbonate) .... Take 1 Tablet By Mouth Two Times A Day 18)  Klor-Con M20 20 Meq Cr-Tabs (Potassium Chloride Crys Cr) .... Take 1 Tablet By Mouth Once Daily 19)  Fluticasone Propionate 50 Mcg/act Susp (Fluticasone Propionate) .... 2 Sprays in Each Nostril Once Daily 20)  Proventil Hfa 108 (90 Base) Mcg/act Aers (Albuterol Sulfate) .... 2 Puffs As Needed For Inhalation Every 4 Hours As Needed  Allergies (verified): 1)  ! Sulfa  Past History:  Past medical, surgical, family and social histories (including risk factors) reviewed, and no changes noted (except as noted below).  Past Medical History: Reviewed history from 11/11/2008 and no changes required. ICD - IN SITU (ICD-V45.02) ATRIAL FIBRILLATION, HX OF (ICD-V12.59) CARDIOMYOPATHY (ICD-425.4) HYPERTENSION (ICD-401.9) HYPERLIPIDEMIA (ICD-272.4) ALLERGIC RHINITIS CAUSE UNSPECIFIED (ICD-477.9) OBSTRUCTIVE SLEEP APNEA (ICD-327.23) Coronary Artery Disease Ventricular tachycardia    Past Surgical History:  Reviewed history from 05/09/2008 and no changes required. ablation ICD - Guidant Contak H170 - Deboraha Sprang, MD - 07/25/06 (2nd device)  Family History: Reviewed history from 05/09/2008 and no changes required. heart disease: mother cancer: maternal grandmoter (breast)  Family History of Coronary Artery Disease:  Family History of Hypertension:   Social History: Reviewed history from 05/09/2008 and no changes required. Patient states former smoker.  pt is married. pt has children. pt is retired. Pt owned a company.  Alcohol  Use - yes - socially  Review of Systems       The patient complains of shortness of breath with activity, productive cough, non-productive cough, weight change, and sneezing.  The patient denies shortness of breath at rest, coughing up blood, chest pain, irregular heartbeats, acid heartburn, indigestion, loss of appetite, abdominal pain, difficulty swallowing, sore throat, tooth/dental problems, headaches, nasal congestion/difficulty breathing through nose, itching, ear ache, anxiety, depression, hand/feet swelling, joint stiffness or pain, rash, change in color of mucus, and fever.    Vital Signs:  Patient profile:   70 year old male Height:      72 inches Weight:      236 pounds BMI:     32.12 O2 Sat:      94 % on Room air Temp:     97.7 degrees F oral Pulse rate:   61 / minute BP sitting:   108 / 70  (left arm) Cuff size:   large  Vitals Entered By: Matthew Folks LPN (January 19, 9507 10:57 AM)  O2 Flow:  Room air CC: Pt is here for a 1 year f/u appt. Pt has lost 34 lbs since last visit. Pt was recently in hosp d/t "bronchitis and heart failure." Pt states he does cough up light yellow colored sputum.  Pt states he wears his cpap machine every night.  Comments Medications reviewed with patient Matthew Folks LPN  January 18, 3265 10:57 AM    Physical Exam  General:  ow male in nad Nose:  no purulence or discharge noted. no skin breakdown or pressure necrosis from cpap mask Lungs:  a few basilar crackles, no wheezing or rhonchi Heart:  rrr Extremities:  mild LE edema, no cyanosis  Neurologic:  alert and oriented, does not appear sleepy, moves all 4.   Impression & Recommendations:  Problem # 1:  OBSTRUCTIVE SLEEP APNEA (ICD-327.23) the pt is doing well with cpap, and feels it is helping his sleep and daytime alertness.  I have asked him to keep up with mask changes, supplies for his machine, and to work on weight reduction.    Problem # 2:  DYSPNEA ON EXERTION  (ICD-786.09) I suspect the pt's doe is related to his weight, conditioning, and CM.  His spirometry today shows restriction that is probably due to his weight, and no airflow obstruction.  Medications Added to Medication List This Visit: 1)  Coreg 25 Mg Tabs (Carvedilol) .... Take 1 1/2 tabs by mouth two times a day 2)  Fluticasone Propionate 50 Mcg/act Susp (Fluticasone propionate) .... 2 sprays in each nostril once daily  Other Orders: Est. Patient Level IV (12458) Spirometry w/Graph (09983)  Patient Instructions: 1)  continue with cpap 2)  work on weight loss 3)  please call if breathing/congestion issues persist. 4)  followup with me in one year, but call if having problems

## 2010-02-15 NOTE — Medication Information (Signed)
Summary: [facility]  Anticoagulant Therapy  Managed by: Tula Nakayama, RN, BSN Referring MD: Kirk Ruths MD PCP: Theodoro Kalata MD: Ron Parker MD, Dellis Filbert Indication 1: Atrial Fibrillation (ICD-427.31) Lab Used: [facility] [place] Site: [facility] INR POC 3.5 INR RANGE 2 - 3  Dietary changes: yes       Details: Eating less green leafy veggies  Health status changes: yes       Details: V-Tachy approx 10 days ago.  Bleeding/hemorrhagic complications: yes       Details: Subconjuctival hemorrhage in Rt eye  Recent/future hospitalizations: no    Any changes in medication regimen? yes       Details: Align daily/PRN. Approx 10 day ago Amiodarone was increased from 243ms daily to 2046m BID  Recent/future dental: no  Any missed doses?: yes     Details: Missed a dose on Monday  Is patient compliant with meds? yes      Comments: Seeing Dr KlCaryl Comesoday  Allergies: 1)  ! Sulfa  Anticoagulation Management History:      The patient is taking warfarin and comes in today for a routine follow up visit.  Positive risk factors for bleeding include an age of [age]ears or older.  The bleeding index is 'intermediate risk'.  Positive CHADS2 values include History of CHF and History of HTN.  Negative CHADS2 values include Age > 7538ears old.  The start date was 10/20/2002.  His last INR was 2.5.  Anticoagulation responsible provider: KaRon ParkerD, JeDellis Filbert INR POC: 3.5.  Cuvette Lot#: [id number] Exp: 08/2010.    Anticoagulation Management Assessment/Plan:      The patient's current anticoagulation dose is Coumadin 5 mg tabs: Take as directed by coumadin clinic..  The target INR is 2 - 3.  The next INR is due 07/11/2009.  Anticoagulation instructions were given to patient.  Results were reviewed/authorized by TiTula NakayamaRN, BSN.  He was notified by TiTula NakayamaRN, BSN.         Prior Anticoagulation Instructions: INR 5.2  Skip today and tomorrow, take 1/2 tablet on Sunday then resume same dose of 1 tablet every  day except 1/2 tablet on Monday, Wednesday and Friday   Current Anticoagulation Instructions: INR 3.5 Skip today  then change dose to 2.52m652meveryday except 52mg72mn Sundays and Thrusdays.  Recheck 7-10 days.

## 2010-02-15 NOTE — Cardiovascular Report (Signed)
Summary: Office Visit Remote   Office Visit Remote   Imported By: Sallee Provencal 01/19/2009 12:27:15  _____________________________________________________________________  External Attachment:    Type:   Image     Comment:   External Document

## 2010-02-15 NOTE — Letter (Signed)
Summary: CMN for CPAP Supplies/[facility]  CMN for CPAP Supplies/[facility]   Imported By: Phillis Knack 06/27/2009 13:53:45  _____________________________________________________________________  External Attachment:    Type:   Image     Comment:   External Document

## 2010-02-15 NOTE — Letter (Signed)
Summary: [facility] [place]  [place]   Imported By: Marilynne Drivers 01/04/2010 10:38:16  _____________________________________________________________________  External Attachment:    Type:   Image     Comment:   External Document

## 2010-02-15 NOTE — Medication Information (Signed)
Summary: rov/sp  Anticoagulant Therapy  Managed by: Porfirio Oar, PharmD Referring MD: Kirk Ruths MD PCP: Theodoro Kalata MD: Harrington Challenger MD, Nevin Bloodgood Indication 1: Atrial Fibrillation (ICD-427.31) Lab Used: [facility] [place] Site: [facility] INR POC 3.0 INR RANGE 2 - 3  Dietary changes: no    Health status changes: no    Bleeding/hemorrhagic complications: no    Recent/future hospitalizations: no    Any changes in medication regimen? yes       Details: on Levaquin since Friday for ?bronchitis/PNA  Recent/future dental: no  Any missed doses?: yes     Details: took 1/2 dose on Friday per PCP MD instructions  Is patient compliant with meds? yes       Allergies: 1)  ! Sulfa  Anticoagulation Management History:      The patient is taking warfarin and comes in today for a routine follow up visit.  Positive risk factors for bleeding include an age of 21 years or older.  The bleeding index is 'intermediate risk'.  Positive CHADS2 values include History of CHF and History of HTN.  Negative CHADS2 values include Age > 82 years old.  The start date was 10/20/2002.  His last INR was 2.5.  Anticoagulation responsible provider: Harrington Challenger MD, Nevin Bloodgood.  INR POC: 3.0.  Exp: 11/15/2010.    Anticoagulation Management Assessment/Plan:      The patient's current anticoagulation dose is Coumadin 5 mg tabs: Take as directed by coumadin clinic..  The target INR is 2 - 3.  The next INR is due 01/18/2010.  Anticoagulation instructions were given to patient.  Results were reviewed/authorized by Porfirio Oar, PharmD.  He was notified by Porfirio Oar PharmD.         Prior Anticoagulation Instructions: INR 1.6  Start normal dose of 1/2 tablet every day except 1 tablet on Monday, Wednesday and Friday after procedure.  Recheck INR in 2 weeks.   Current Anticoagulation Instructions: INR 3.0  Take 1/2 tablet today then resume same dose of 1/2 tablet every day except 1 tablet on Monday, Wednesday and Friday.  Recheck INR  in 2-3 weeks.

## 2010-02-15 NOTE — Medication Information (Signed)
Summary: rov/cb  Anticoagulant Therapy  Managed by: Porfirio Oar, PharmD Referring MD: Kirk Ruths MD PCP: Theodoro Kalata MD: Angelena Form MD, Harrell Gave Indication 1: Atrial Fibrillation (ICD-427.31) Lab Used: [facility]  Site: [facility] INR POC 2.5 INR RANGE 2 - 3  Dietary changes: no    Health status changes: no    Bleeding/hemorrhagic complications: no    Recent/future hospitalizations: no    Any changes in medication regimen? yes       Details: amiodarone increased about a month ago.   Recent/future dental: no  Any missed doses?: no       Is patient compliant with meds? yes       Allergies: 1)  ! Sulfa  Anticoagulation Management History:      The patient is taking warfarin and comes in today for a routine follow up visit.  Positive risk factors for bleeding include an age of 70 years or older.  The bleeding index is 'intermediate risk'.  Positive CHADS2 values include History of CHF and History of HTN.  Negative CHADS2 values include Age > 47 years old.  The start date was 10/20/2002.  His last INR was 2.5.  Anticoagulation responsible provider: Angelena Form MD, Harrell Gave.  INR POC: 2.5.  Cuvette Lot#: [id number].  Exp: 09/2010.    Anticoagulation Management Assessment/Plan:      The patient's current anticoagulation dose is Coumadin 5 mg tabs: Take as directed by coumadin clinic..  The target INR is 2 - 3.  The next INR is due 08/23/2009.  Anticoagulation instructions were given to patient.  Results were reviewed/authorized by Porfirio Oar, PharmD.  He was notified by Porfirio Oar PharmD.         Prior Anticoagulation Instructions: INR 2.1. Take 0.5 tablet daily except 1 tablet on Sun and Thurs. Recheck in 2 weeks.  Current Anticoagulation Instructions: INR 2.5  Continue same dose of 1/2 tablet every day except 1 tablet on Sunday and Thursday.

## 2010-02-15 NOTE — Assessment & Plan Note (Signed)
Summary: rov for osa, AR   Primary Provider/Referring Provider:  Burnett Harry  CC:  Pt is here for a 1 yr f/u appt.  Pt states he is wearing his cpap machine 5 nights per week.  Approx 7 hours per night.  Pt states he just got a new mask and denies any complaints with it.  Pt denied any complaints with pressure.  Pt requets refill on Nexium and Xyzal.  pt states he currently doesn't take Xyzal but would like to have on hand for the spring/summer d/t his allergies. .  History of Present Illness: the pt comes in today for his yearly f/u for osa.  He is doing well with cpap, and reports no issues with mask fit or pressure.  He feels he is sleeping well, and is satisfied with his daytime alertness.  I have also seen him in the past for cough due to AR and LPR.  He has been doing well on as needed zyzal and PPI.  Current Medications (verified): 1)  Furosemide 40 Mg Tabs (Furosemide) .... Take 1 Tablet By Mouth Once A Day 2)  Nitroquick 0.4 Mg Subl (Nitroglycerin) .... Take By Mouth As Needed 3)  Saw Palmetto 450 Mg  Caps (Saw Palmetto (Serenoa Repens)) .... Take 1 Tablet By Mouth Two Times A Day 4)  Multivitamins   Caps (Multiple Vitamin) .... Take 1 Tablet By Mouth Once A Day 5)  Low-Dose Aspirin 81 Mg  Tabs (Aspirin) .... Take 1 Tablet By Mouth Once A Day 6)  Mexiletine Hcl 150 Mg  Caps (Mexiletine Hcl) .... Take 2 Tabs By Mouth Two Times A Day 7)  Avapro 150 Mg  Tabs (Irbesartan) .... 1/2 Tab By Mouth Once Daily 8)  Amiodarone Hcl 200 Mg  Tabs (Amiodarone Hcl) .... Take 1 Tablet By Mouth  Daily 9)  Crestor 20 Mg Tabs (Rosuvastatin Calcium) .... Take One Tablet By Mouth Daily. 10)  Coumadin 5 Mg Tabs (Warfarin Sodium) .... Take As Directed By Coumadin Clinic. 11)  Carvedilol 25 Mg Tabs (Carvedilol) .[name].. 1 Tab By Mouth Two Times A Day With 12.5 Mg Two Times A Day To Equal 37.5 Two Times A Day 12)  Gnp Flax Seed Oil 1000 Mg Caps (Flaxseed (Linseed)) .... Take 1 Capsule By Mouth Two Times A Day 13)   Nexium 40 Mg  Cpdr (Esomeprazole Magnesium) .... Take 1 Tablet By Mouth Once A Day 14)  Dulcolax Stool Softener 100 Mg Caps (Docusate Sodium) .[name].. 1 Tab By Mouth Two Times A Day 15)  Preservision/lutein  Caps (Multiple Vitamins-Minerals) .[name].. 1 Tab By Mouth Once Daily 16)  Caltrate 600 1500 Mg Tabs (Calcium Carbonate) .[name].. 1 Tab By Mouth Once Daily 17)  Veramyst 27.5 Mcg/spray  Susp (Fluticasone Furoate) .[name].. 1 Puff in Each Nostril Once Daily 18)  Zyrtec Allergy 10 Mg Tabs (Cetirizine Hcl) .... Take 1 Tablet By Mouth Once A Day  Allergies (verified): 1)  ! Sulfa  Review of Systems      See HPI  Vital Signs:  Patient profile:   70 year old male Height:      72 inches Weight:      270 pounds BMI:     36.75 O2 Sat:      95 % on Room air Temp:     98.0 degrees F oral Pulse rate:   72 / minute BP sitting:   118 / 82  (right arm) Cuff size:   large  Vitals Entered By: Matthew Folks LPN (January 18, 2009 10:58 AM)  O2 Flow:  Room air CC: Pt is here for a 1 yr f/u appt.  Pt states he is wearing his cpap machine 5 nights per week.  Approx 7 hours per night.  Pt states he just got a new mask and denies any complaints with it.  Pt denied any complaints with pressure.  Pt requets refill on Nexium and Xyzal.  pt states he currently doesn't take Xyzal but would like to have on hand for the spring/summer d/t his allergies.  Comments Medications reviewed with patient Matthew Folks LPN  January 18, 373 11:08 AM    Physical Exam  General:  obese male in nad Nose:  no skin breakdown or pressure necrosis from cpap mask no purulent drainage. Neurologic:  alert and oriented, not sleepy, moves all 4.   Impression & Recommendations:  Problem # 1:  OBSTRUCTIVE SLEEP APNEA (ICD-327.23) the pt is doing well with his current therapy.  I have asked him to stay on cpap, and to work aggressively on weight loss.  Problem # 2:  COUGH (ICD-786.2) His cough is doing well with treatment of his AR and  LPR.  Continue meds.  Medications Added to Medication List This Visit: 1)  Veramyst 27.5 Mcg/spray Susp (Fluticasone furoate) .[name].. 1 puff in each nostril once daily 2)  Zyrtec Allergy 10 Mg Tabs (Cetirizine hcl) .... Take 1 tablet by mouth once a day 3)  Xyzal 5 Mg Tabs (Levocetirizine dihydrochloride) .... By mouth daily  Other Orders: Est. Patient Level II (43606) DME Referral (DME)  Patient Instructions: 1)  no change in cpap 2)  work on weight loss 3)  will refill meds 4)  followup with me in one year.   Prescriptions: NEXIUM 40 MG  CPDR (ESOMEPRAZOLE MAGNESIUM) Take 1 tablet by mouth once a day  #30 x 11   Entered and Authorized by:   Kathee Delton MD   Signed by:   Kathee Delton MD on 01/18/2009   Method used:   Print then Give to Patient   RxID:   [id number] XYZAL 5 MG  TABS (LEVOCETIRIZINE DIHYDROCHLORIDE) By mouth daily  #30 x 11   Entered and Authorized by:   Kathee Delton MD   Signed by:   Kathee Delton MD on 01/18/2009   Method used:   Print then Give to Patient   RxID:   [id number] NEXIUM 40 MG  CPDR (ESOMEPRAZOLE MAGNESIUM) Take 1 tablet by mouth once a day  #30 x 11   Entered and Authorized by:   Kathee Delton MD   Signed by:   Kathee Delton MD on 01/18/2009   Method used:   Print then Give to Patient   RxID:   [id number]

## 2010-02-15 NOTE — Cardiovascular Report (Signed)
Summary: Office Visit   Office Visit   Imported By: Sallee Provencal 03/16/2009 14:04:34  _____________________________________________________________________  External Attachment:    Type:   Image     Comment:   External Document

## 2010-02-15 NOTE — Progress Notes (Signed)
  Pt. came in today filled out ROI, I forwarded to [place] for processing [facility]  March 01, 2009 11:57 AM

## 2010-02-15 NOTE — Medication Information (Signed)
Summary: rov/tm  Anticoagulant Therapy  Managed by: Margaretha Sheffield, PharmD Referring MD: Kirk Ruths MD PCP: Theodoro Kalata MD: Lovena Le MD, Carleene Overlie Indication 1: Atrial Fibrillation (ICD-427.31) Lab Used: [facility]  Site: [facility] INR POC 2.7 INR RANGE 2 - 3  Dietary changes: yes       Details: eating more green vegetables to help with constipation  Health status changes: no    Bleeding/hemorrhagic complications: no    Recent/future hospitalizations: no    Any changes in medication regimen? yes       Details: started using veramyst nasal spray  Recent/future dental: no  Any missed doses?: no       Is patient compliant with meds? yes      Comments: Laparoscopic gall bladder removal in 1-2 months.  I have educated patient regarding procedures for coming off coumadin, he will notify us when the proceudre is scheduled.  Allergies: 1)  ! Sulfa  Anticoagulation Management History:      The patient is taking warfarin and comes in today for a routine follow up visit.  Positive risk factors for bleeding include an age of 69 years or older.  The bleeding index is 'intermediate risk'.  Positive CHADS2 values include History of HTN.  Negative CHADS2 values include Age > 69 years old.  The start date was 10/20/2002.  His last INR was 3.1 RATIO.  Anticoagulation responsible provider: Lovena Le MD, Carleene Overlie.  INR POC: 2.7.  Cuvette Lot#: [id number].  Exp: 05/2010.    Anticoagulation Management Assessment/Plan:      The patient's current anticoagulation dose is Coumadin 5 mg tabs: Take as directed by coumadin clinic..  The target INR is 2 - 3.  The next INR is due 04/27/2009.  Anticoagulation instructions were given to patient.  Results were reviewed/authorized by Margaretha Sheffield, PharmD.  He was notified by Margaretha Sheffield.         Prior Anticoagulation Instructions: INR 1.9 Today take 7.60ms then resume 554m daily except 2.51m10mon Mondays, Wednesdays and Fridays. Recheck in 2-3 weeks.     Current Anticoagulation Instructions: INR 2.7  Continue taking 1/2 tablet on Monday, Wednesday, and Friday and 1 tablet all other days.  Return to clinic in 4 weeks.

## 2010-02-15 NOTE — Medication Information (Signed)
Summary: rov/sl  Anticoagulant Therapy  Managed by: Tula Nakayama, RN, BSN Referring MD: Kirk Ruths MD PCP: Theodoro Kalata MD: Johnsie Cancel MD, Collier Salina Indication 1: Atrial Fibrillation (ICD-427.31) Lab Used: [place] Site: [facility] INR POC 2.6 INR RANGE 2 - 3  Dietary changes: no    Health status changes: yes       Details: Has nail fungal infection, will see PCP and call if he starts new med  Bleeding/hemorrhagic complications: no    Recent/future hospitalizations: no    Any changes in medication regimen? no    Recent/future dental: no  Any missed doses?: no       Is patient compliant with meds? yes       Allergies: 1)  ! Sulfa  Anticoagulation Management History:      The patient is taking warfarin and comes in today for a routine follow up visit.  Positive risk factors for bleeding include an age of 70 years or older.  The bleeding index is 'intermediate risk'.  Positive CHADS2 values include History of CHF and History of HTN.  Negative CHADS2 values include Age > 8 years old.  The start date was 10/20/2002.  His last INR was 2.5.  Anticoagulation responsible Daliya Parchment: Johnsie Cancel MD, Collier Salina.  INR POC: 2.6.  Cuvette Lot#: [id number].  Exp: 11/15/2010.    Anticoagulation Management Assessment/Plan:      The patient's current anticoagulation dose is Coumadin 5 mg tabs: Take as directed by coumadin clinic..  The target INR is 2 - 3.  The next INR is due 12/20/2009.  Anticoagulation instructions were given to patient.  Results were reviewed/authorized by Tula Nakayama, RN, BSN.  He was notified by Tula Nakayama, RN, BSN.         Prior Anticoagulation Instructions: INR 3.1  Today, Wednesday, October 13th, take Coumadin 0.5 tab (2.5 mg). Then, resume taking Coumadin 0.5 tab (2.5 mg) on Sun, Tues, Thur, Sat and Coumadin 1 tab (5 mg) on Mon, Wed, Fri. Return to clinic in 4 weeks.   Current Anticoagulation Instructions: INR 2.6 Continue 2.61ms daily except 58m on Mondays,  Wednesdays and Fridays. Recheck in 4 weeks.

## 2010-02-15 NOTE — Miscellaneous (Signed)
  Clinical Lists Changes  Observations: Added new observation of CXR RESULTS:    Findings: Left-sided pacemaker with two continuous leads overlie   stable mildly enlarged heart silhouette.  There is mild blunting of   the left costophrenic angle which is unchanged from prior and   likely represents a fat pad or scarring.  Right lung is clear.  No   pulmonary edema.  No pleural fluid.    IMPRESSION:    1.  No change from prior.   2.  No acute cardiopulmonary process.   3.  Mild cardiomegaly.    Read By:  Suzy Bouchard,  M.D. (05/17/2009 15:51)      CXR  Procedure date:  05/17/2009  Findings:         Findings: Left-sided pacemaker with two continuous leads overlie   stable mildly enlarged heart silhouette.  There is mild blunting of   the left costophrenic angle which is unchanged from prior and   likely represents a fat pad or scarring.  Right lung is clear.  No   pulmonary edema.  No pleural fluid.    IMPRESSION:    1.  No change from prior.   2.  No acute cardiopulmonary process.   3.  Mild cardiomegaly.    Read By:  Suzy Bouchard,  M.D.

## 2010-02-15 NOTE — Assessment & Plan Note (Signed)
Summary: Cardiology Nuclear Testing  Nuclear Med Background Indications for Stress Test: Evaluation for Ischemia, Surgical Clearance, PTCA Patency, Post Hospital  Indications Comments: 12/13/09 SOB (CHF). Pending Pacemaker wire change on 12/20/09.  History: Ablation, Angioplasty, Defibrillator, Echo, Heart Catheterization, Myocardial Infarction, Myocardial Perfusion Study  History Comments: '96 MI IWMI TX WITH PTCA- RCA.  '02 ABLATION  AFIB .   07/08 DEFIBRILLATOR (2ND). 10/08 HEART CATH RCA OCC WITH COLLATERALS.                           6/10 MPS LG INF WALL INFARCT FROM APEX TO BASE (-) ISCHEMIA EF 21%. 12/11/09 Echo: EF=20-25%. Hx. PAF,ICM,VT Storm.    Symptoms: DOE, Fatigue, Fatigue with Exertion, Light-Headedness, SOB  Symptoms Comments: .   Nuclear Pre-Procedure Cardiac Risk Factors: Family History - CAD, History of Smoking, Hypertension, Lipids, Obesity Caffeine/Decaff Intake: none NPO After: 10:00 PM Lungs: Clear IV 0.9% NS with Angio Cath: 22g     IV Site: R Hand IV Started by: Matilde Haymaker, RN Chest Size (in) 52     Height (in): 72 Weight (lb): 235 BMI: 31.99 Tech Comments: Coreg held x 13hrs.  Nuclear Med Study 1 or 2 day study:  1 day     Stress Test Type:  Adenosine Reading MD:  Darlin Coco, MD     Referring MD:  Kirk Ruths MD Resting Radionuclide:  Technetium 20mTetrofosmin     Resting Radionuclide Dose:  10.8 mCi  Stress Radionuclide:  Technetium 914metrofosmin     Stress Radionuclide Dose:  33 mCi   Stress Protocol   Dose of Atropine: 59.8 mg   Stress Test Technologist:  PaIrven Baltimore RN     Nuclear Technologist:  ToAnnye RuskCNMT  Rest Procedure  Myocardial perfusion imaging was performed at rest 45 minutes following the intravenous administration of Technetium 9965mtrofosmin.  Stress Procedure  The patient received IV adenosine at 140 mcg/kg/min for 4 minutes. The EKG was nondiagnostic due to baseline V-Pacing.The patient had  chest tightness with adenosine. Technetium 75m69mrofosmin was injected at the 2 minute mark and quantitative spect images were obtained after a 45 minute delay.  QPS Raw Data Images:  Normal; no motion artifact; normal heart/lung ratio. Stress Images:  There is decreased uptake in the inferior wall Rest Images:  There is decreased uptake in the inferior wall. Subtraction (SDS):  There is a fixed defect that is most consistent with a previous infarction. Transient Ischemic Dilatation:  .97  (Normal <1.22)  Lung/Heart Ratio:  .27  (Normal <0.45)  Quantitative Gated Spect Images QGS EDV:  418 ml QGS ESV:  340 ml QGS EF:  19 % QGS cine images:  Marked LV dilatation. Inferior wall akinesis.  Findings Low risk nuclear study Clinically Abnormal (chest pain, ST abnormality, hypotension)  Evidence for inferior infarct  Evidence for LV Dysfunction LV Dysfunction    Overall Impression  Exercise Capacity: Adenosine study with no exercise. BP Response: Normal blood pressure response. Clinical Symptoms: Mild chest pain/dyspnea. ECG Impression: Ventricular pacing. Overall Impression: Low risk stress nuclear study. Overall Impression Comments: Large area of inferior wall scar.  No ischemia. EF 19%.  Markedly dilated LV.  Appended Document: Cardiology Nuclear Testing ok  Appended Document: Cardiology Nuclear Testing Left message to call back    Appended Document: Cardiology Nuclear Testing pt aware of results

## 2010-02-15 NOTE — Progress Notes (Signed)
  Walk in Patient Form Recieved " Pt dropped off BP readings" sent to [place]  July 12, 2009 8:03 AM

## 2010-02-15 NOTE — Miscellaneous (Signed)
Summary: Device change out  Clinical Lists Changes  Observations: Added new observation of ICDLEADSTAT3: active (12/22/2009 8:59) Added new observation of ICDLEADSER3: [id number] (12/22/2009 8:59) Added new observation of ICDLEADMOD3: 1258T (12/22/2009 8:59) Added new observation of ICDLEADDOI3: 12/20/2009 (12/22/2009 8:59) Added new observation of ICDLEADLOC3: LV (12/22/2009 8:59) Added new observation of ICD IMPL DTE: 12/20/2009 (12/22/2009 8:59) Added new observation of ICD SERL#: [id number]  (12/22/2009 8:59) Added new observation of ICD MODL#: N119  (12/22/2009 8:59) Added new observation of ICDEXPLCOMM: 12/20/09 [employer] [id number] explanted  (12/22/2009 8:59)       ICD Specifications Following MD:  Virl Axe, MD     Referring MD:  Stanford Breed ICD Vendor:  [employer]     ICD Model Number:  N119     ICD Serial Number:  [id number] ICD DOI:  12/20/2009     ICD Implanting MD:  Virl Axe, MD  Lead 1:    Location: RA     DOI: 06/09/2000     Model #: [id number]     Serial #: [id number]     Status: active Lead 2:    Location: RV     DOI: 06/09/2000     Model #: [id number]     Serial #: [id number]     Status: active Lead 3:    Location: LV     DOI: 12/20/2009     Model #: [id number]     Serial #: [id number]     Status: active  Indications::  ICM  Explantation Comments: 12/20/09 [employer] [id number] explanted  ICD Follow Up ICD Dependent:  No      Episodes Coumadin:  Yes  Brady Parameters Mode DDDR     Lower Rate Limit:  60     Upper Rate Limit 110 PAV 180     Sensed AV Delay:  300  Tachy Zones VF:  200     VT:  180     VT1:  135

## 2010-02-15 NOTE — Assessment & Plan Note (Signed)
Summary: PC2   Visit Type:  Follow-up Primary [name]:  [name]   History of Present Illness: [name] is a is seen in followup for ventricular tachycardia in thesetting of  ischemic and nonischemic cardiomyopathy. He has been treated with a combination of mexiletine and amiodarone without clinical recurrences. Most recent ejection fraction by catheterization was 20% and Myoview June 2010 showed prior inferior infarct no ischemia and ejection fraction of 25%  He has done well on a higher dose of amiodarone dose her by Dr. Lovena Le a few months ago in my absence.At his last visit we down titrated from 400-300 mg a day.  He also has paroxysmal atrial fibrillation and is on oral anticoagulation.  Current Medications (verified): 1)  Furosemide 40 Mg Tabs (Furosemide) .... Take 1 Tablet By Mouth Once A Day 2)  Nitroquick 0.4 Mg Subl (Nitroglycerin) .... Take By Mouth As Needed 3)  Saw Palmetto 450 Mg  Caps (Saw Palmetto (Serenoa Repens)) .... Take 1 Tablet By Mouth Two Times A Day 4)  Multivitamins   Caps (Multiple Vitamin) .... Take 1 Tablet By Mouth Once A Day 5)  Low-Dose Aspirin 81 Mg  Tabs (Aspirin) .... Take 1 Tablet By Mouth Once A Day 6)  Mexiletine Hcl 150 Mg  Caps (Mexiletine Hcl) .... Take 2 Tabs By Mouth Two Times A Day 7)  Diovan 80 Mg Tabs (Valsartan) .... One Tablet By Mouth Once Daily 8)  Amiodarone Hcl 200 Mg Tabs (Amiodarone Hcl) .... Take 1 and 1/2 Tablets Daily. 9)  Crestor 20 Mg Tabs (Rosuvastatin Calcium) .... Take One Tablet By Mouth Daily. 10)  Coumadin 5 Mg Tabs (Warfarin Sodium) .... Take As Directed By Coumadin Clinic. 11)  Carvedilol 25 Mg Tabs (Carvedilol) .[name].. 1 1/2 Tab By Mouth Two Times A Day.....Marland Kitchenequals 37.96m Two Times A Day 12)  Gnp Flax Seed Oil 1000 Mg Caps (Flaxseed (Linseed)) .... Take 1 Capsule By Mouth Two Times A Day 13)  Dulcolax Stool Softener 100 Mg Caps (Docusate Sodium) ..[name]. 1 Tab By Mouth Two Times A Day 14)  Preservision/lutein  Caps (Multiple  Vitamins-Minerals) ..[name]. 1 Tab By Mouth Once Daily 15)  Align  Caps (Probiotic Product) .... Take One Capsule Daily 16)  Xyzal 5 Mg Tabs (Levocetirizine Dihydrochloride) .... Once Daily  Allergies: 1)  ! Sulfa  Past History:  Past Medical History: Last updated: 11/11/2008 ICD - IN SITU (ICD-V45.02) ATRIAL FIBRILLATION, HX OF (ICD-V12.59) CARDIOMYOPATHY (ICD-425.4) HYPERTENSION (ICD-401.9) HYPERLIPIDEMIA (ICD-272.4) ALLERGIC RHINITIS CAUSE UNSPECIFIED (ICD-477.9) OBSTRUCTIVE SLEEP APNEA (ICD-327.23) Coronary Artery Disease Ventricular tachycardia    Vital Signs:  Patient profile:   70year old male Height:      72 inches Weight:      254 pounds BMI:     34.57 Pulse rate:   64 / minute BP sitting:   110 / 76  (left arm)  Vitals Entered By: JMargaretmary BayleyCMA (October 03, 2009 12:22 PM)  Physical Exam  General:  The patient was alert and oriented in no acute distress. HEENT Normal.  Neck veins were flat, carotids were brisk.  Lungs were clear.  Heart sounds were regular without murmurs or gallops.  Abdomen was soft with active bowel sounds. There is no clubbing cyanosis or edema. Skin Warm and dry     ICD Specifications Following MD:  SVirl Axe MD     Referring MD:  CStanford BreedICD Vendor:  BKannapolisNumber:  [id number]    ICD Serial Number:  [id number] ICD DOI:  07/25/2006     ICD Implanting MD:  Virl Axe, MD  Lead 1:    Location: RA     DOI: 06/09/2000     Model #: [id number]     Serial #: [id number]     Status: active Lead 2:    Location: RV     DOI: 06/09/2000     Model #: [id number]     Serial #: [id number]     Status: active  Indications::  ICM   ICD Follow Up Remote Check?  No Battery Voltage:  2.93 V     Charge Time:  6.9 seconds     Underlying rhythm:  Loletha Grayer ICD Dependent:  No       ICD Device Measurements Atrium:  Amplitude: 1.9 mV, Impedance: 602 ohms, Threshold: 0.6 V at 0.4 msec Right Ventricle:  Amplitude: 6.3 mV, Impedance: 620 ohms,  Threshold: 1.2 V at 0.4 msec Shock Impedance: 40 ohms   Episodes MS Episodes:  21     Percent Mode Switch:  0%     Coumadin:  Yes Shock:  0     ATP:  0     Nonsustained:  3     Atrial Pacing:  100%     Ventricular Pacing:  99%  Brady Parameters Mode DDDR     Lower Rate Limit:  60     Upper Rate Limit 110 PAV 180     Sensed AV Delay:  300  Tachy Zones VF:  200     VT:  180     VT1:  135     Next Remote Date:  01/04/2010     Next Cardiology Appt Due:  09/15/2010 Tech Comments:  No parameter changes.  Longest A-fib 12 minutes, + coumadin.   Checked by [employer].  Latitude transmissions every 3 months.  ROV 1 year. Alma Friendly, LPN  October 04, 8826 12:44 PM   Impression & Recommendations:  Problem # 1:  SYSTOLIC HEART FAILURE, CHRONIC (ICD-428.22) stable on current medications  Problem # 2:  ATRIAL FIBRILLATION (ICD-427.31) a few paroxysms of atrial fibrillation were identified. He is appropriately anticoagulated  Problem # 3:  VENTRICULAR TACHYCARDIA (ICD-427.1) He had 3 episodes of nonsustained ventricular tachycardia continuing at a very slow cycle length of approximately 500 ms. We will continue him on his current dose of amiodarone as opposed to further down titration and will review this again in 4 months  Problem # 4:  CAD (ICD-414.00) stable His updated medication list for this problem includes:    Nitroquick 0.4 Mg Subl (Nitroglycerin) .[name]... Take by mouth as needed    Low-dose Aspirin 81 Mg Tabs (Aspirin) .[name]... Take 1 tablet by mouth once a day    Coumadin 5 Mg Tabs (Warfarin sodium) .[name]... Take as directed by coumadin clinic.    Carvedilol 25 Mg Tabs (Carvedilol) .[name]... 1 1/2 tab by mouth two times a day.....Marland Kitchenequals 37.20m two times a day  Problem # 5:  CARDIOMYOPATHY, ISCHEMIC (ICD-414.8) stable   Problem # 6:  ICD - CRT LV PORT PLUGGED GDT (ICD-V45.02) Device parameters and data were reviewed and no changes were made  Patient Instructions: 1)  Your physician  recommends that you continue on your current medications as directed. Please refer to the Current Medication list given to you today. 2)  Your physician wants you to follow-up in:   4 months You will receive a reminder letter in the mail two months in advance. If you don't receive  a letter, please call our office to schedule the follow-up appointment.

## 2010-02-15 NOTE — Medication Information (Signed)
Summary: [email or web address].mp  Anticoagulant Therapy  Managed by: Freddrick March, RN, BSN Referring MD: Kirk Ruths MD PCP: Theodoro Kalata MD: Rayann Heman MD, Jeneen Rinks Indication 1: Atrial Fibrillation (ICD-427.31) Lab Used: [place] Site: [facility] INR POC 1.7 INR RANGE 2 - 3  Dietary changes: no    Health status changes: no    Bleeding/hemorrhagic complications: yes       Details: Trace amts of blood in urine, had CT scan and cystscopy  Recent/future hospitalizations: no    Any changes in medication regimen? no    Recent/future dental: no  Any missed doses?: yes     Details: Off coumadin x 3 days prior to cyctoscopy.  Resumed coumadin and recheck x 1 week later.    Is patient compliant with meds? yes       Current Medications (verified): 1)  Furosemide 40 Mg Tabs (Furosemide) .... Take 1 Tablet By Mouth Once A Day 2)  Nitroquick 0.4 Mg Subl (Nitroglycerin) .... Take By Mouth As Needed 3)  Saw Palmetto 450 Mg  Caps (Saw Palmetto (Serenoa Repens)) .... Take 1 Tablet By Mouth Two Times A Day 4)  Multivitamins   Caps (Multiple Vitamin) .... Take 1 Tablet By Mouth Once A Day 5)  Low-Dose Aspirin 81 Mg  Tabs (Aspirin) .... Take 1 Tablet By Mouth Once A Day 6)  Mexiletine Hcl 150 Mg  Caps (Mexiletine Hcl) .... Take 2 Tabs By Mouth Two Times A Day 7)  Diovan 80 Mg Tabs (Valsartan) .... One Tablet By Mouth Once Daily 8)  Amiodarone Hcl 200 Mg  Tabs (Amiodarone Hcl) .... Take 1 Tablet By Mouth  Daily 9)  Crestor 20 Mg Tabs (Rosuvastatin Calcium) .... Take One Tablet By Mouth Daily. 10)  Coumadin 5 Mg Tabs (Warfarin Sodium) .... Take As Directed By Coumadin Clinic. 11)  Carvedilol 25 Mg Tabs (Carvedilol) .[name].. 1 Tab By Mouth Two Times A Day With 12.5 Mg Two Times A Day To Equal 37.5 Two Times A Day 12)  Gnp Flax Seed Oil 1000 Mg Caps (Flaxseed (Linseed)) .... Take 1 Capsule By Mouth Two Times A Day 13)  Nexium 40 Mg  Cpdr (Esomeprazole Magnesium) .... Take 1 Tablet By Mouth Once A Day 14)   Dulcolax Stool Softener 100 Mg Caps (Docusate Sodium) .[name].. 1 Tab By Mouth Two Times A Day 15)  Preservision/lutein  Caps (Multiple Vitamins-Minerals) .[name].. 1 Tab By Mouth Once Daily 16)  Caltrate 600 1500 Mg Tabs (Calcium Carbonate) .[name].. 1 Tab By Mouth Once Daily 17)  Veramyst 27.5 Mcg/spray  Susp (Fluticasone Furoate) .[name].. 1 Puff in Each Nostril Once Daily 18)  Zyrtec Allergy 10 Mg Tabs (Cetirizine Hcl) .... Take 1 Tablet By Mouth Once A Day 19)  Xyzal 5 Mg  Tabs (Levocetirizine Dihydrochloride) .... By Mouth Daily  Allergies (verified): 1)  ! Sulfa  Anticoagulation Management History:      The patient is taking warfarin and comes in today for a routine follow up visit.  Positive risk factors for bleeding include an age of 39 years or older.  The bleeding index is 'intermediate risk'.  Positive CHADS2 values include History of HTN.  Negative CHADS2 values include Age > 80 years old.  The start date was 10/20/2002.  His last INR was 3.1 RATIO.  Anticoagulation responsible provider: Stanislawa Gaffin MD, Jeneen Rinks.  INR POC: 1.7.  Cuvette Lot#: [id number].  Exp: 04/2010.    Anticoagulation Management Assessment/Plan:      The patient's current anticoagulation dose is Coumadin 5 mg tabs:  Take as directed by coumadin clinic..  The target INR is 2 - 3.  The next INR is due 03/15/2009.  Anticoagulation instructions were given to patient.  Results were reviewed/authorized by Freddrick March, RN, BSN.  He was notified by Freddrick March RN.         Prior Anticoagulation Instructions: INR 2.3  Take 0.5 tab each Monday, Wednesday, Friday and 1 tab on all other days.  Recheck in 10 - 14 days.    Current Anticoagulation Instructions: INR 1.7  Take 1 tablet today then resume same dosage 1 tablet daily except 1/2 tablet on Mondays, Wednesdays, and Fridays.  Recheck in 2 weeks.

## 2010-02-15 NOTE — Progress Notes (Signed)
  Phone Note Outgoing Call   Call placed by: Fredia Beets, RN,  February 23, 2009 11:54 AM Summary of Call: pt is currently taking avapro 170m 1/2 tablet once daily. per insurance he will need to try diovan, losartan pot, losartan hctz or micardias before he can get approval for the avapro. will foward to dr cStanford Breedfor his review. DFredia Beets RN  February 23, 2009 11:56 AM   Follow-up for Phone Call        dc avapro; diovan 80 mg by mouth daily BColin Mulders MD, [facility] February 23, 2009 3:49 PM  pt aware DFredia Beets RN  February 23, 2009 5:43 PM     New/Updated Medications: DIOVAN 80 MG TABS (VALSARTAN) one tablet by mouth once daily Prescriptions: DIOVAN 80 MG TABS (VALSARTAN) one tablet by mouth once daily  #30 x 12   Entered by:   DFredia Beets RN   Authorized by:   BColin Mulders MD, FCampus [facility] Asc  Signed by:   DFredia Beets RN on 02/23/2009   Method used:   [facility] to        [facility]  [address] [facility]       [address]       [place]  [id number]      Ph: [phone]      Fax: [phone]  RxID:   [id number]

## 2010-02-15 NOTE — Progress Notes (Addendum)
Summary: Request for medical records faxed  Phone Note Other Incoming   Action Taken: Information Sent Details of Request: Request of medical records to change pcp Initial call taken by: Bud Face,  January 29, 2010 4:09 PM     Appended Document: Request for medical records faxed Records received from [place] [facility] [place] on 1.24.2012.

## 2010-02-15 NOTE — Progress Notes (Signed)
Summary: Nuclear Pre-Procedure  Phone Note Outgoing Call   Call placed by: Perrin Maltese, EMT-P,  December 18, 2009 1:28 PM Summary of Call: Reviewed information on Myoview Information Sheet (see scanned document for further details).  Spoke with patient.     Nuclear Med Background Indications for Stress Test: Evaluation for Ischemia, Surgical Clearance   History: Ablation, Angioplasty, Defibrillator, Echo, Heart Catheterization, Myocardial Infarction, Myocardial Perfusion Study  History Comments: '96 MI IWMI TX WITH PTCA- RCA  '96 ANGIOPLASTY RCA                '02 ABLATION  AFIB    07/08 DEFIBRILLATOR (2ND) 09/08 ECHO EF 40-45%  10/08 HEART CATH RCA OCC WITH COLLATERALS EF 20% H/O CARDIOMYOPATHY                            '07 MPS LG INF WALL INFARCT FROM APEX TO BASE (-) ISCHEMIA EF 21%   Symptoms: Chest Pain, DOE, Near Syncope, Palpitations, Rapid HR  Symptoms Comments: VTACH: 2 EPISODES   Nuclear Pre-Procedure Cardiac Risk Factors: Family History - CAD, History of Smoking, Hypertension, Lipids, Obesity Height (in): 15  Nuclear Med Study Referring MD:  Kirk Ruths MD

## 2010-02-15 NOTE — Letter (Signed)
Summary: Handout Printed  Printed Handout:  - Coumadin Instructions-w/out Meds

## 2010-02-15 NOTE — Letter (Signed)
Summary: Device-Delinquent Phone Transmission  [facility]  [address]   [place], [place] [address]   Phone: [phone]  Fax: [phone]     June 16, 2009 MRN: [id number]   MONTERRIO GERST [address] [place], [place]  [address]   Dear Mr. Belmonte,  According to our records, you were scheduled for a device phone transmission on                              06/15/09.     We did not receive any results from this check.  If you transmitted on your scheduled day, please call us to help troubleshoot your system.  If you forgot to send your transmission, please send one upon receipt of this letter.  Thank you,  Perrin Maltese, EMT-P  June 16, 2009 3:19 PM  [facility]

## 2010-02-15 NOTE — Assessment & Plan Note (Signed)
Summary: appt 4:30/rov/device problems per paula   Primary Provider:  Burnett Harry  CC:  ;device problems /pt feeling good today.  History of Present Illness: Mr. Reim is a is seen in followup for ventricular tachycardia in thesetting of  ischemic and nonischemic cardiomyopathy. He has been treated with a combination of mexiletine and amiodarone without clinical recurrences. Most recent ejection fraction by catheterization was 20% and Myoview June 2010 showed prior inferior infarct no ischemia and ejection fraction of 20%   T. he came into the office in an incessant sustained slow ventricular tachycardia which we were successful in pace terminating. We reprogrammed his ICD to a detection rate of approximately 100; he had flurries of ventricular tachycardia until about 2 weeks ago. At that time Dr. Lovena Le in my absence increase his amiodarone to 400 mg a day and has been no further ventricular tachycardia. The patient feels terrific.  Current Medications (verified): 1)  Furosemide 40 Mg Tabs (Furosemide) .... Take 1 Tablet By Mouth Once A Day 2)  Nitroquick 0.4 Mg Subl (Nitroglycerin) .... Take By Mouth As Needed 3)  Saw Palmetto 450 Mg  Caps (Saw Palmetto (Serenoa Repens)) .... Take 1 Tablet By Mouth Two Times A Day 4)  Multivitamins   Caps (Multiple Vitamin) .... Take 1 Tablet By Mouth Once A Day 5)  Low-Dose Aspirin 81 Mg  Tabs (Aspirin) .... Take 1 Tablet By Mouth Once A Day 6)  Mexiletine Hcl 150 Mg  Caps (Mexiletine Hcl) .... Take 2 Tabs By Mouth Two Times A Day 7)  Diovan 80 Mg Tabs (Valsartan) .... One Tablet By Mouth Once Daily-Pt Has Not Started This Medication As of Yet.  He Will Start March 8)  Amiodarone Hcl 200 Mg  Tabs (Amiodarone Hcl) .... Take 1 Tablet Two Times A Day 9)  Crestor 20 Mg Tabs (Rosuvastatin Calcium) .... Take One Tablet By Mouth Daily. 10)  Coumadin 5 Mg Tabs (Warfarin Sodium) .... Take As Directed By Coumadin Clinic. 11)  Carvedilol 25 Mg Tabs (Carvedilol) .[name].. 1  1/2 Tab By Mouth Two Times A Day.....Marland Kitchenequals 37.43m Two Times A Day 12)  Gnp Flax Seed Oil 1000 Mg Caps (Flaxseed (Linseed)) .... Take 1 Capsule By Mouth Two Times A Day 13)  Nexium 40 Mg  Cpdr (Esomeprazole Magnesium) .... Take 1 Tablet By Mouth Once A Day 14)  Dulcolax Stool Softener 100 Mg Caps (Docusate Sodium) ..[name]. 1 Tab By Mouth Two Times A Day 15)  Preservision/lutein  Caps (Multiple Vitamins-Minerals) ..[name]. 1 Tab By Mouth Once Daily 16)  Caltrate 600 1500 Mg Tabs (Calcium Carbonate) ..[name]. 1 Tab By Mouth Once Daily 17)  Veramyst 27.5 Mcg/spray  Susp (Fluticasone Furoate) ..[name]. 1 Puff in Each Nostril Once Daily 18)  Xyzal 5 Mg  Tabs (Levocetirizine Dihydrochloride) .... By Mouth Daily 19)  Align  Caps (Probiotic Product) .... Take One Capsule Daily  Allergies (verified): 1)  ! Sulfa  Past History:  Past Medical History: Last updated: 11/11/2008 ICD - IN SITU (ICD-V45.02) ATRIAL FIBRILLATION, HX OF (ICD-V12.59) CARDIOMYOPATHY (ICD-425.4) HYPERTENSION (ICD-401.9) HYPERLIPIDEMIA (ICD-272.4) ALLERGIC RHINITIS CAUSE UNSPECIFIED (ICD-477.9) OBSTRUCTIVE SLEEP APNEA (ICD-327.23) Coronary Artery Disease Ventricular tachycardia    Past Surgical History: Last updated: 05/09/2008 ablation ICD - Guidant Contak H170 - SDeboraha Sprang MD - 07/25/06 (2nd device)  Family History: Last updated: 05/09/2008 heart disease: mother cancer: maternal grandmoter (breast)  Family History of Coronary Artery Disease:  Family History of Hypertension:   Social History: Last updated: 05/09/2008 Patient states former smoker.  pt  is married. pt has children. pt is retired. Pt owned a company.  Alcohol Use - yes - socially  Vital Signs:  Patient profile:   70 year old male Height:      72 inches Weight:      249 pounds BMI:     33.89 Pulse rate:   69 / minute BP sitting:   122 / 78  (left arm) Cuff size:   large  Vitals Entered By: Doug Sou CMA (June 29, 2009 4:16 PM)  Physical  Exam  General:  The patient was alert and oriented in no acute distress. HEENT Normal.  Neck veins were flat, carotids were brisk.  Lungs were clear.  Heart sounds were regular without murmurs or gallops.  Abdomen was soft with active bowel sounds. There is no clubbing cyanosis or edema. Skin Warm and dry     ICD Specifications Following MD:  Virl Axe, MD     Referring MD:  Stanford Breed ICD Vendor:  [place] Number:  [id number]     ICD Serial Number:  [id number] ICD DOI:  07/25/2006     ICD Implanting MD:  Virl Axe, MD  Lead 1:    Location: RA     DOI: 06/09/2000     Model #: [id number]     Serial #: [id number]     Status: active Lead 2:    Location: RV     DOI: 06/09/2000     Model #: [id number]     Serial #: [id number]     Status: active  Indications::  ICM   ICD Follow Up ICD Dependent:  No      Episodes Coumadin:  Yes  Brady Parameters Mode DDDR     Lower Rate Limit:  60     Upper Rate Limit 110 PAV 180     Sensed AV Delay:  300  Tachy Zones VF:  200     VT:  180     VT1:  135     Impression & Recommendations:  Problem # 1:  VENTRICULAR TACHYCARDIA (ICD-427.1) the patient has had no further ventricular tachycardia in the last 2 weeks. We'll plan to keep him on amiodarone 400 a day for about 6 more weeks and then gradually wean him back down. 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 paced with a very broad QRS. Unfortunately the antiarrhythmic properties of resynchronization are not clear although recent studies have suggested that ventricular arrhythmias are improved in patients with left bundle but not in patients with non-left bundle and a fact may be increased in a group; the patient's with paced left bundle are not clearly separated in the studies  that I have seen.  Problem # 4:  ICD - CRT LV PORT PLUGGED GDT (ICD-V45.02) Device parameters and data were reviewed and no changes were made  Problem # 5:  CARDIOMYOPATHY, ISCHEMIC (ICD-414.8) stable His updated medication list for this problem includes:    Furosemide 40 Mg Tabs (Furosemide) .[name]... Take 1 tablet by mouth once a day    Nitroquick 0.4 Mg Subl (Nitroglycerin) .[name]... Take by mouth as needed    Low-dose Aspirin 81 Mg Tabs (Aspirin) .[name]... Take 1 tablet by mouth once a day    Mexiletine Hcl 150 Mg Caps (  Mexiletine hcl) .[name]... Take 2 tabs by mouth two times a day    Diovan 80 Mg Tabs (Valsartan) ..... One tablet by mouth once daily-pt has not started this medication as of yet.  he will start march    Amiodarone Hcl 200 Mg Tabs (Amiodarone hcl) .[name]... Take 1 tablet two times a day    Coumadin 5 Mg Tabs (Warfarin sodium) .[name]... Take as directed by coumadin clinic.    Carvedilol 25 Mg Tabs (Carvedilol) .[name]... 1 1/2 tab by mouth two times a day.....Marland Kitchenequals 37.25m two times a day  Other Orders: EKG w/ Interpretation (93000)  Patient Instructions: 1)  Your physician recommends that you schedule a follow-up appointment in: 6 weeks with Dr KCaryl Comes

## 2010-02-15 NOTE — Progress Notes (Signed)
Summary: coumadin instructs pending cystoscopy  Phone Note Call from Patient Call back at [phone]   Caller: Patient Call For: Coumadin Clinic Summary of Call: Pt called states he is scheduled for a cystoscopy with Dr Terance Hart on 02/20/09.  Pt unsure if he is to go off his coumadin prior to procedure.  Pt states he questioned Dr Elam Dutch nurse and she said sure, stay on coumadin and pt is unsure if this is appropriate.  Attempted to call Dr Elam Dutch office.  LMOM pt called concerned about staying on coumadin and having cystoscopy done.  Requested Dr Terance Hart advise if pt if OK to stay on coumadin for procedure or if he needs to hold prior to procedure. Initial call taken by: Freddrick March, RN, BSN,  February 07, 2009 12:36 PM  Follow-up for Phone Call        Spoke with Dr Elam Dutch nurse per his note in the chart pt is to hold coumadin 2 days prior to procedure.  Will send phone note to Dr Stanford Breed for clearance to hold coumadin 2 days prior to cystoscopy on 02/20/09.  Please advise. Thanks. Follow-up by: Freddrick March RN,  February 08, 2009 9:53 AM  Additional Follow-up for Phone Call Additional follow up Details #1::        ok to hold coumadin prior to the procedure; resume day of procedure Colin Mulders, MD, [facility]  February 08, 2009 1:46 PM     Additional Follow-up for Phone Call Additional follow up Details #2::    Called spoke with pt.  Pt aware per Dr Terance Hart wants pt to hold x 2 days prior to procedure.  OK per Dr Stanford Breed to hold.  Pt has appt on 02/15/09 for f/u in [place]. Follow-up by: Freddrick March RN,  February 09, 2009 8:21 AM

## 2010-02-15 NOTE — Assessment & Plan Note (Signed)
Summary: eph/heart failure/mt   Visit Type:  Follow-up Primary Provider:  Burnett Harry   History of Present Illness: Mr. Boardley is a pleasant gentleman with a history of coronary artery disease, mixed ischemic and nonischemic cardiomyopathy, history of ICD, paroxysmal atrial fibrillation, and ventricular tachycardia. Previous carotid Dopplers in April 2006 showed 0-39% stenosis bilaterally. Previous abdominal ultrasound in April 2006 showed no aneurysm. Last cardiac catheterization in October 2008 showed an occluded right coronary artery with left-to-right collateralization. Ejection fraction is 20%. There was no other coronary disease noted. Last Myoview in Dec 2011 showed prior inferior infarct but no ischemia. His ejection fraction was 19%. ABIs in December of 2011 were normal. Patient admitted with increased congestive heart failure symptoms in November of 2011. His diuretics were increased. An echocardiogram showed an ejection fraction of 20-25%, moderate left atrial enlargement and moderate mitral regurgitation. The patient subsequently had upgrade of his device to CRT-D. Since then, the patient denies any dyspnea on exertion, orthopnea, PND, pedal edema, palpitations, syncope or chest pain. He does note a productive cough of green sputum. His primary care physician started him on antibiotics.   Current Medications (verified): 1)  Furosemide 40 Mg Tabs (Furosemide) .[name].. 1 Two Times A Day 2)  Nitroquick 0.4 Mg Subl (Nitroglycerin) .... Take By Mouth As Needed 3)  Saw Palmetto 450 Mg  Caps (Saw Palmetto (Serenoa Repens)) .... Take 1 Tablet By Mouth Two Times A Day 4)  Multivitamins   Caps (Multiple Vitamin) .... Take 1 Tablet By Mouth Once A Day 5)  Low-Dose Aspirin 81 Mg  Tabs (Aspirin) .... Take 1 Tablet By Mouth Once A Day 6)  Mexiletine Hcl 150 Mg  Caps (Mexiletine Hcl) .... Take 2 Tabs By Mouth Two Times A Day 7)  Diovan 80 Mg Tabs (Valsartan) .... One Tablet By Mouth Once Daily 8)  Amiodarone  Hcl 200 Mg Tabs (Amiodarone Hcl) .... Take 1 and 1/2 Tablets Daily. 9)  Crestor 20 Mg Tabs (Rosuvastatin Calcium) .... Take One Tablet By Mouth Daily. 10)  Coumadin 5 Mg Tabs (Warfarin Sodium) .... Take As Directed By Coumadin Clinic. 11)  Carvedilol 12.5 Mg Tabs (Carvedilol) .... Take 1 and 1/2 Tablets  By Mouth Two Times A Day 12)  Gnp Flax Seed Oil 1000 Mg Caps (Flaxseed (Linseed)) .... Take 1 Capsule By Mouth Two Times A Day 13)  Dulcolax Stool Softener 100 Mg Caps (Docusate Sodium) .[name].. 1 Tab By Mouth Two Times A Day 14)  Preservision/lutein  Caps (Multiple Vitamins-Minerals) .[name].. 1 Tab By Mouth Once Daily 15)  Align  Caps (Probiotic Product) .... Take One Capsule Daily 16)  Xyzal 5 Mg Tabs (Levocetirizine Dihydrochloride) .... Once Daily 17)  Lactulose 10 Gm/64m Soln (Lactulose) .... Take As Directed 18)  Caltrate 600 1500 Mg Tabs (Calcium Carbonate) .... Take 1 Tablet By Mouth Two Times A Day 19)  Klor-Con M20 20 Meq Cr-Tabs (Potassium Chloride Crys Cr) .... Take 1 Tablet By Mouth Once Daily 20)  Veramyst 27.5 Mcg/spray Susp (Fluticasone Furoate) .... Spray 2 Puffs in Each Nostril Nasally Once Daily 21)  Proventil Hfa 108 (90 Base) Mcg/act Aers (Albuterol Sulfate) .... 2 Puffs As Needed For Inhalation Every 4 Hours As Needed 22)  Levofloxacin 500 Mg Tabs (Levofloxacin) .... Take One Daily  Allergies (verified): 1)  ! Sulfa  Past History:  Past Medical History: Reviewed history from 11/11/2008 and no changes required. ICD - IN SITU (ICD-V45.02) ATRIAL FIBRILLATION, HX OF (ICD-V12.59) CARDIOMYOPATHY (ICD-425.4) HYPERTENSION (ICD-401.9) HYPERLIPIDEMIA (ICD-272.4) ALLERGIC RHINITIS CAUSE  UNSPECIFIED (ICD-477.9) OBSTRUCTIVE SLEEP APNEA (ICD-327.23) Coronary Artery Disease Ventricular tachycardia    Past Surgical History: Reviewed history from 05/09/2008 and no changes required. ablation ICD - Guidant Contak H170 - Deboraha Sprang, MD - 07/25/06 (2nd device)  Social  History: Reviewed history from 05/09/2008 and no changes required. Patient states former smoker.  pt is married. pt has children. pt is retired. Pt owned a company.  Alcohol Use - yes - socially  Review of Systems       no fevers or chills, productive cough, hemoptysis, dysphasia, odynophagia, melena, hematochezia, dysuria, hematuria, rash, seizure activity, orthopnea, PND, pedal edema, claudication. Remaining systems are negative.   Vital Signs:  Patient profile:   70 year old male Height:      72 inches Weight:      233 pounds Pulse rate:   64 / minute Pulse rhythm:   regular BP sitting:   114 / 79  (left arm)  Vitals Entered By: Talbert Nan, CMA (January 02, 2010 2:01 PM)  Physical Exam  General:  Well-developed well-nourished in no acute distress.  Skin is warm and dry.  HEENT is normal.  Neck is supple. No thyromegaly.  Chest is clear to auscultation with normal expansion.  Cardiovascular exam is regular rate and rhythm.  Abdominal exam nontender or distended. No masses palpated. Extremities show no edema. neuro grossly intact     ICD Specifications Following MD:  Virl Axe, MD     Referring MD:  Stanford Breed ICD Vendor:  [place] Number:  [phone]     ICD Serial Number:  [id number] ICD DOI:  12/20/2009     ICD Implanting MD:  Virl Axe, MD  Lead 1:    Location: RA     DOI: 06/09/2000     Model #: [id number]     Serial #: [id number]     Status: active Lead 2:    Location: RV     DOI: 06/09/2000     Model #: [id number]     Serial #: [id number]     Status: active Lead 3:    Location: LV     DOI: 12/20/2009     Model #: [id number]     Serial #: [id number]     Status: active  Indications::  ICM  Explantation Comments: 12/20/09 [employer] H170/36626 explanted  ICD Follow Up ICD Dependent:  No      Episodes Coumadin:  Yes  Brady Parameters Mode DDDR     Lower Rate Limit:  60     Upper Rate Limit 110 PAV 180     Sensed AV Delay:  300  Tachy Zones VF:   200     VT:  180     VT1:  135     Impression & Recommendations:  Problem # 1:  PURE HYPERCHOLESTEROLEMIA (ICD-272.0)  Continue statin. His updated medication list for this problem includes:    Crestor 20 Mg Tabs (Rosuvastatin calcium) .[name]... Take one tablet by mouth daily.  His updated medication list for this problem includes:    Crestor 20 Mg Tabs (Rosuvastatin calcium) .[name]... Take one tablet by mouth daily.  Problem # 2:  SYSTOLIC HEART FAILURE, CHRONIC (ICD-428.22)  Patient euvolemic on examination. With the higher dose of diuretic he has discontinued his ARB as his blood pressure was low and he was somewhat dizzy. However he has now undergone CRT therapy. I will decrease his Lasix to 40 mg daily and he will  take an additional 40 mg daily p.r.n. increased shortness of breath or weight gain of 2 pounds. I will discontinue his potassium. Check potassium and renal function in one week. Resume his ARB. Plan to repeat his echocardiogram in 3 months to see if his LV function has improved and his MR has decreased. His updated medication list for this problem includes:    Furosemide 40 Mg Tabs (Furosemide) .[name]... 1 two times a day    Nitroquick 0.4 Mg Subl (Nitroglycerin) .[name]... Take by mouth as needed    Low-dose Aspirin 81 Mg Tabs (Aspirin) .[name]... Take 1 tablet by mouth once a day    Mexiletine Hcl 150 Mg Caps (Mexiletine hcl) .[name]... Take 2 tabs by mouth two times a day    Diovan 80 Mg Tabs (Valsartan) ..... One tablet by mouth once daily    Amiodarone Hcl 200 Mg Tabs (Amiodarone hcl) .[name]... Take 1 and 1/2 tablets daily.    Coumadin 5 Mg Tabs (Warfarin sodium) .[name]... Take as directed by coumadin clinic.    Carvedilol 12.5 Mg Tabs (Carvedilol) .[name]... Take 1 and 1/2 tablets  by mouth two times a day  Orders: Echocardiogram (Echo)  Problem # 3:  CAD (ICD-414.00)  Continue aspirin, beta blocker and statin. His updated medication list for this problem includes:    Nitroquick 0.4 Mg Subl  (Nitroglycerin) .[name]... Take by mouth as needed    Low-dose Aspirin 81 Mg Tabs (Aspirin) .[name]... Take 1 tablet by mouth once a day    Coumadin 5 Mg Tabs (Warfarin sodium) .[name]... Take as directed by coumadin clinic.    Carvedilol 12.5 Mg Tabs (Carvedilol) .[name]... Take 1 and 1/2 tablets  by mouth two times a day  His updated medication list for this problem includes:    Nitroquick 0.4 Mg Subl (Nitroglycerin) .[name]... Take by mouth as needed    Low-dose Aspirin 81 Mg Tabs (Aspirin) .[name]... Take 1 tablet by mouth once a day    Coumadin 5 Mg Tabs (Warfarin sodium) .[name]... Take as directed by coumadin clinic.    Carvedilol 12.5 Mg Tabs (Carvedilol) .[name]... Take 1 and 1/2 tablets  by mouth two times a day  Problem # 4:  COUMADIN THERAPY (ICD-V58.61) Goal INR 2-3.  Problem # 5:  ATRIAL FIBRILLATION (ICD-427.31) Continue amiodarone and Coumadin. His updated medication list for this problem includes:    Low-dose Aspirin 81 Mg Tabs (Aspirin) .[name]... Take 1 tablet by mouth once a day    Mexiletine Hcl 150 Mg Caps (Mexiletine hcl) .[name]... Take 2 tabs by mouth two times a day    Amiodarone Hcl 200 Mg Tabs (Amiodarone hcl) .[name]... Take 1 and 1/2 tablets daily.    Coumadin 5 Mg Tabs (Warfarin sodium) .[name]... Take as directed by coumadin clinic.    Carvedilol 12.5 Mg Tabs (Carvedilol) .[name]... Take 1 and 1/2 tablets  by mouth two times a day  Problem # 6:  VENTRICULAR TACHYCARDIA (ICD-427.1)  Continue amiodarone and mexiletine. His updated medication list for this problem includes:    Nitroquick 0.4 Mg Subl (Nitroglycerin) .[name]... Take by mouth as needed    Low-dose Aspirin 81 Mg Tabs (Aspirin) .[name]... Take 1 tablet by mouth once a day    Mexiletine Hcl 150 Mg Caps (Mexiletine hcl) .[name]... Take 2 tabs by mouth two times a day    Amiodarone Hcl 200 Mg Tabs (Amiodarone hcl) .[name]... Take 1 and 1/2 tablets daily.    Coumadin 5 Mg Tabs (Warfarin sodium) .[name]... Take as directed by coumadin clinic.  Carvedilol 12.5 Mg Tabs (Carvedilol) .[name]...  Take 1 and 1/2 tablets  by mouth two times a day  His updated medication list for this problem includes:    Nitroquick 0.4 Mg Subl (Nitroglycerin) .[name]... Take by mouth as needed    Low-dose Aspirin 81 Mg Tabs (Aspirin) .[name]... Take 1 tablet by mouth once a day    Mexiletine Hcl 150 Mg Caps (Mexiletine hcl) .[name]... Take 2 tabs by mouth two times a day    Amiodarone Hcl 200 Mg Tabs (Amiodarone hcl) .[name]... Take 1 and 1/2 tablets daily.    Coumadin 5 Mg Tabs (Warfarin sodium) .[name]... Take as directed by coumadin clinic.    Carvedilol 12.5 Mg Tabs (Carvedilol) .[name]... Take 1 and 1/2 tablets  by mouth two times a day  Problem # 7:  ICD - CRT LV PORT PLUGGED GDT (ICD-V45.02) Magement per electrophysiology.  Problem # 8:  HYPERTENSION (ICD-401.9)  Blood pressure control. Continue present medications. His updated medication list for this problem includes:    Furosemide 40 Mg Tabs (Furosemide) .[name]... 1 two times a day    Low-dose Aspirin 81 Mg Tabs (Aspirin) .[name]... Take 1 tablet by mouth once a day    Diovan 80 Mg Tabs (Valsartan) ..... One tablet by mouth once daily    Carvedilol 12.5 Mg Tabs (Carvedilol) .[name]... Take 1 and 1/2 tablets  by mouth two times a day  His updated medication list for this problem includes:    Furosemide 40 Mg Tabs (Furosemide) .[name]... 1 two times a day    Low-dose Aspirin 81 Mg Tabs (Aspirin) .[name]... Take 1 tablet by mouth once a day    Diovan 80 Mg Tabs (Valsartan) ..... One tablet by mouth once daily    Carvedilol 12.5 Mg Tabs (Carvedilol) .[name]... Take 1 and 1/2 tablets  by mouth two times a day  Problem # 9:  HYPERLIPIDEMIA (ICD-272.4) Continue statin. His updated medication list for this problem includes:    Crestor 20 Mg Tabs (Rosuvastatin calcium) .[name]... Take one tablet by mouth daily.  Patient Instructions: 1)  Your physician recommends that you schedule a follow-up appointment in: [place] 2)  Your physician recommends that you return for lab work in:1 [place] Gonzalez  BMET  V58.69 401.1 3)  Your physician has recommended you make the following change in your medication: STOP POTASSIUM 4)  DECREASE FUROSEMIDE 40 MG 1 once daily MAY TAKE ADDITIONAL FUROSEMIDE  FOR SHORTNESS OF BREATH OR 2 POUND WEIGHT GAIN IN 24 HOUR  PERIOD 5)  RESTART DIOVAN 80 MG 1 once daily  6)  Your physician has requested that you have an echocardiogram.  Echocardiography is a painless test that uses sound waves to create images of your heart. It provides your doctor with information about the size and shape of your heart and how well your heart's chambers and valves are working.  This procedure takes approximately one hour. There are no restrictions for this procedure.3 MONTHS Prescriptions: AMIODARONE HCL 200 MG TABS (AMIODARONE HCL) Take 1 and 1/2 tablets daily.  #45 x 6   Entered by:   Devra Dopp, LPN   Authorized by:   Colin Mulders, MD, [facility]   Signed by:   Devra Dopp, LPN on 48/25/0037   Method used:   Electronically to        [facility]  [place] [address] (retail)       [address] [place] [address]       [place], [place]  [address]  Ph: [phone] or [phone]       Fax: [phone]   RxID:   [id number]

## 2010-02-15 NOTE — Assessment & Plan Note (Signed)
Summary: 6 MONTH ROV/SL   Primary Provider:  Burnett Harry  CC:  no complaints.  History of Present Illness: [name] is a pleasant gentleman with a history of coronary artery disease, mixed ischemic and nonischemic cardiomyopathy, history of ICD, paroxysmal atrial fibrillation, and ventricular tachycardia. Previous carotid Dopplers in April 2006 showed 0-39% stenosis bilaterally. Previous abdominal ultrasound in April 2006 showed no aneurysm. Last cardiac catheterization in October 2008 showed an occluded right coronary artery with left-to-right collateralization. Ejection fraction is 20%. There was no other coronary disease noted. Last Myoview in June of 2010 showed prior inferior infarct but no ischemia. His ejection fraction was 21%. I last saw him in November of 2010. Since then he has dyspnea with more extreme activities but not with routine activities. It is relieved with rest. It is not associated with chest pain. There is no orthopnea, PND, pedal edema, palpitations or syncope. There is no exertional chest pain. His ICD has not fired.  Current Medications (verified): 1)  Furosemide 40 Mg Tabs (Furosemide) .... Take 1 Tablet By Mouth Once A Day 2)  Nitroquick 0.4 Mg Subl (Nitroglycerin) .... Take By Mouth As Needed 3)  Saw Palmetto 450 Mg  Caps (Saw Palmetto (Serenoa Repens)) .... Take 1 Tablet By Mouth Two Times A Day 4)  Multivitamins   Caps (Multiple Vitamin) .... Take 1 Tablet By Mouth Once A Day 5)  Low-Dose Aspirin 81 Mg  Tabs (Aspirin) .... Take 1 Tablet By Mouth Once A Day 6)  Mexiletine Hcl 150 Mg  Caps (Mexiletine Hcl) .... Take 2 Tabs By Mouth Two Times A Day 7)  Diovan 80 Mg Tabs (Valsartan) .... One Tablet By Mouth Once Daily-Pt Has Not Started This Medication As of Yet.  He Will Start March 8)  Amiodarone Hcl 200 Mg  Tabs (Amiodarone Hcl) .... Take 1 Tablet By Mouth  Daily 9)  Crestor 20 Mg Tabs (Rosuvastatin Calcium) .... Take One Tablet By Mouth Daily. 10)  Coumadin 5 Mg Tabs  (Warfarin Sodium) .... Take As Directed By Coumadin Clinic. 11)  Carvedilol 25 Mg Tabs (Carvedilol) .[name].. 1 Tab By Mouth Two Times A Day With 12.5 Mg Two Times A Day To Equal 37.5 Two Times A Day 12)  Gnp Flax Seed Oil 1000 Mg Caps (Flaxseed (Linseed)) .... Take 1 Capsule By Mouth Two Times A Day 13)  Nexium 40 Mg  Cpdr (Esomeprazole Magnesium) .... Take 1 Tablet By Mouth Once A Day 14)  Dulcolax Stool Softener 100 Mg Caps (Docusate Sodium) .[name].. 1 Tab By Mouth Two Times A Day 15)  Preservision/lutein  Caps (Multiple Vitamins-Minerals) .[name].. 1 Tab By Mouth Once Daily 16)  Caltrate 600 1500 Mg Tabs (Calcium Carbonate) .[name].. 1 Tab By Mouth Once Daily 17)  Veramyst 27.5 Mcg/spray  Susp (Fluticasone Furoate) .[name].. 1 Puff in Each Nostril Once Daily 18)  Xyzal 5 Mg  Tabs (Levocetirizine Dihydrochloride) .... By Mouth Daily  Allergies: 1)  ! Sulfa  Past History:  Past Medical History: Reviewed history from 11/11/2008 and no changes required. ICD - IN SITU (ICD-V45.02) ATRIAL FIBRILLATION, HX OF (ICD-V12.59) CARDIOMYOPATHY (ICD-425.4) HYPERTENSION (ICD-401.9) HYPERLIPIDEMIA (ICD-272.4) ALLERGIC RHINITIS CAUSE UNSPECIFIED (ICD-477.9) OBSTRUCTIVE SLEEP APNEA (ICD-327.23) Coronary Artery Disease Ventricular tachycardia    Past Surgical History: Reviewed history from 05/09/2008 and no changes required. ablation ICD - Guidant Contak H170 - Deboraha Sprang, MD - 07/25/06 (2nd device)  Social History: Reviewed history from 05/09/2008 and no changes required. Patient states former smoker.  pt is married. pt has  children. pt is retired. Pt owned a company.  Alcohol Use - yes - socially  Review of Systems       He does have some fatigue but no fevers or chills, productive cough, hemoptysis, dysphasia, odynophagia, melena, hematochezia, dysuria, hematuria, rash, seizure activity, orthopnea, PND, pedal edema, claudication. Remaining systems are negative.   Vital Signs:  Patient profile:   70  year old male Height:      72 inches Weight:      262 pounds BMI:     35.66 Pulse rate:   61 / minute Resp:     14 per minute BP sitting:   97 / 68  (left arm)  Vitals Entered By: Burnett Kanaris (May 17, 2009 11:00 AM)  Physical Exam  General:  Well-developed well-nourished in no acute distress.  Skin is warm and dry.  HEENT is normal.  Neck is supple. No thyromegaly.  Chest is clear to auscultation with normal expansion.  Cardiovascular exam is regular rate and rhythm.  Abdominal exam nontender or distended. No masses palpated. Extremities show no edema. neuro grossly intact    EKG  Procedure date:  05/17/2009  Findings:      AV pacing.   ICD Specifications Following MD:  Virl Axe, MD     Referring MD:  Stanford Breed ICD Vendor:  [address] Number:  [id number]     ICD Serial Number:  [id number] ICD DOI:  07/25/2006     ICD Implanting MD:  Virl Axe, MD  Lead 1:    Location: [facility]     DOI: 06/09/2000     Model #: [id number]     Serial #: [id number]     Status: active Lead 2:    Location: [facility]     DOI: 06/09/2000     Model #: 0100     Serial #: [id number]     Status: active  Indications::  ICM   ICD Follow Up ICD Dependent:  No      Episodes Coumadin:  Yes  Brady Parameters Mode DDDR     Lower Rate Limit:  60     Upper Rate Limit 110 PAV 180     Sensed AV Delay:  300  Tachy Zones VF:  200     VT:  180     VT1:  135     Impression & Recommendations:  Problem # 1:  COUMADIN THERAPY (ICD-V58.61) Followed in the Coumadin clinic. Goal INR 2-3. Check CBC.  Problem # 2:  CAD (ICD-414.00)  Continue aspirin, beta blocker and statin. His updated medication list for this problem includes:    Nitroquick 0.4 Mg Subl (Nitroglycerin) .[name]... Take by mouth as needed    Low-dose Aspirin 81 Mg Tabs (Aspirin) .[name]... Take 1 tablet by mouth once a day    Coumadin 5 Mg Tabs (Warfarin sodium) .[name]... Take as directed by coumadin clinic.    Carvedilol 25 Mg Tabs (Carvedilol) .[name]...  1 tab by mouth two times a day with 12.5 mg two times a day to equal 37.5 two times a day  Orders: TLB-CBC Platelet - w/Differential (85025-CBCD) TLB-BMP (Basic Metabolic Panel-BMET) (58832-PQDIYME)  His updated medication list for this problem includes:    Nitroquick 0.4 Mg Subl (Nitroglycerin) .[name]... Take by mouth as needed    Low-dose Aspirin 81 Mg Tabs (Aspirin) .[name]... Take 1 tablet by mouth once a day    Coumadin 5 Mg Tabs (Warfarin sodium) .[name]... Take as directed by  coumadin clinic.    Carvedilol 25 Mg Tabs (Carvedilol) .[name]... 1 tab by mouth two times a day with 12.5 mg two times a day to equal 37.5 two times a day  Problem # 3:  ATRIAL FIBRILLATION, HX OF (ICD-V12.59) Continued amiodarone and beta blocker as well as Coumadin. Check TSH, liver functions and chest x-ray for amiodarone use. His updated medication list for this problem includes:    Nitroquick 0.4 Mg Subl (Nitroglycerin) .[name]... Take by mouth as needed    Low-dose Aspirin 81 Mg Tabs (Aspirin) .[name]... Take 1 tablet by mouth once a day    Mexiletine Hcl 150 Mg Caps (Mexiletine hcl) .[name]... Take 2 tabs by mouth two times a day    Amiodarone Hcl 200 Mg Tabs (Amiodarone hcl) .[name]... Take 1 tablet by mouth  daily    Coumadin 5 Mg Tabs (Warfarin sodium) .[name]... Take as directed by coumadin clinic.    Carvedilol 25 Mg Tabs (Carvedilol) .[name]... 1 tab by mouth two times a day with 12.5 mg two times a day to equal 37.5 two times a day  Orders: TLB-TSH (Thyroid Stimulating Hormone) (84443-TSH) T-2 View CXR (71020TC)  His updated medication list for this problem includes:    Nitroquick 0.4 Mg Subl (Nitroglycerin) .[name]... Take by mouth as needed    Low-dose Aspirin 81 Mg Tabs (Aspirin) .[name]... Take 1 tablet by mouth once a day    Mexiletine Hcl 150 Mg Caps (Mexiletine hcl) .[name]... Take 2 tabs by mouth two times a day    Amiodarone Hcl 200 Mg Tabs (Amiodarone hcl) .[name]... Take 1 tablet by mouth  daily    Coumadin 5 Mg Tabs (Warfarin sodium) .[name]... Take as  directed by coumadin clinic.    Carvedilol 25 Mg Tabs (Carvedilol) .[name]... 1 tab by mouth two times a day with 12.5 mg two times a day to equal 37.5 two times a day  Problem # 4:  VENTRICULAR TACHYCARDIA (ICD-427.1) Continue mexiletine and amiodarone. Will discuss with Dr. Caryl Comes reducing medications. His updated medication list for this problem includes:    Nitroquick 0.4 Mg Subl (Nitroglycerin) .[name]... Take by mouth as needed    Low-dose Aspirin 81 Mg Tabs (Aspirin) .[name]... Take 1 tablet by mouth once a day    Mexiletine Hcl 150 Mg Caps (Mexiletine hcl) .[name]... Take 2 tabs by mouth two times a day    Amiodarone Hcl 200 Mg Tabs (Amiodarone hcl) .[name]... Take 1 tablet by mouth  daily    Coumadin 5 Mg Tabs (Warfarin sodium) .[name]... Take as directed by coumadin clinic.    Carvedilol 25 Mg Tabs (Carvedilol) .[name]... 1 tab by mouth two times a day with 12.5 mg two times a day to equal 37.5 two times a day  His updated medication list for this problem includes:    Nitroquick 0.4 Mg Subl (Nitroglycerin) .[name]... Take by mouth as needed    Low-dose Aspirin 81 Mg Tabs (Aspirin) .[name]... Take 1 tablet by mouth once a day    Mexiletine Hcl 150 Mg Caps (Mexiletine hcl) .[name]... Take 2 tabs by mouth two times a day    Amiodarone Hcl 200 Mg Tabs (Amiodarone hcl) .[name]... Take 1 tablet by mouth  daily    Coumadin 5 Mg Tabs (Warfarin sodium) .[name]... Take as directed by coumadin clinic.    Carvedilol 25 Mg Tabs (Carvedilol) .[name]... 1 tab by mouth two times a day with 12.5 mg two times a day to equal 37.5 two times a day  Problem # 5:  CARDIOMYOPATHY, ISCHEMIC (ICD-414.8) Continue ARB, and beta blocker. His updated medication list for this problem includes:    Furosemide 40 Mg Tabs (Furosemide) .[name]... Take 1 tablet by mouth once a day    Nitroquick 0.4 Mg Subl (Nitroglycerin) .[name]... Take by mouth as needed    Low-dose Aspirin 81 Mg Tabs (Aspirin) .[name]... Take 1 tablet by mouth once a day    Mexiletine Hcl 150 Mg Caps (Mexiletine hcl) .[name]...  Take 2 tabs by mouth two times a day    Diovan 80 Mg Tabs (Valsartan) ..... One tablet by mouth once daily-pt has not started this medication as of yet.  he will start march    Amiodarone Hcl 200 Mg Tabs (Amiodarone hcl) .[name]... Take 1 tablet by mouth  daily    Coumadin 5 Mg Tabs (Warfarin sodium) .[name]... Take as directed by coumadin clinic.    Carvedilol 25 Mg Tabs (Carvedilol) .[name]... 1 tab by mouth two times a day with 12.5 mg two times a day to equal 37.5 two times a day  Problem # 6:  ICD - CRT LV PORT PLUGGED GDT (ICD-V45.02) Management per EP.  Problem # 7:  HYPERTENSION (ICD-401.9) Blood pressure controlled on present medications. Will continue. Check be met. His updated medication list for this problem includes:    Furosemide 40 Mg Tabs (Furosemide) .[name]... Take 1 tablet by mouth once a day    Low-dose Aspirin 81 Mg Tabs (Aspirin) .[name]... Take 1 tablet by mouth once a day    Diovan 80 Mg Tabs (Valsartan) ..... One tablet by mouth once daily-pt has not started this medication as of yet.  he will start march    Carvedilol 25 Mg Tabs (Carvedilol) .[name]... 1 tab by mouth two times a day with 12.5 mg two times a day to equal 37.5 two times a day  Problem # 8:  HYPERLIPIDEMIA (ICD-272.4) Continue statin. Check lipids and liver. His updated medication list for this problem includes:    Crestor 20 Mg Tabs (Rosuvastatin calcium) .[name]... Take one tablet by mouth daily.  Orders: TLB-Lipid Panel (80061-LIPID) TLB-Hepatic/Liver Function Pnl (80076-HEPATIC)  Problem # 9:  OBSTRUCTIVE SLEEP APNEA (ICD-327.23)  Patient Instructions: 1)  Your physician recommends that you schedule a follow-up appointment in: 6 MONTHS

## 2010-02-15 NOTE — Medication Information (Signed)
Summary: rov/ewj  Anticoagulant Therapy  Managed by: Tula Nakayama, RN, BSN Referring MD: Kirk Ruths MD PCP: Theodoro Kalata MD: Angelena Form MD, Harrell Gave Indication 1: Atrial Fibrillation (ICD-427.31) Lab Used: [facility] Site: [facility] INR POC 1.9 INR RANGE 2 - 3  Dietary changes: yes       Details: Slightly more leafy green veggies.  Health status changes: no    Bleeding/hemorrhagic complications: no    Recent/future hospitalizations: no    Any changes in medication regimen? yes       Details: switching from Avapro to Diovan  Recent/future dental: no  Any missed doses?: no       Is patient compliant with meds? yes      Comments: Seeing Dr. Caryl Comes today  Allergies: 1)  ! Sulfa  Anticoagulation Management History:      The patient is taking warfarin and comes in today for a routine follow up visit.  Positive risk factors for bleeding include an age of 6 years or older.  The bleeding index is 'intermediate risk'.  Positive CHADS2 values include History of HTN.  Negative CHADS2 values include Age > 39 years old.  The start date was 10/20/2002.  His last INR was 3.1 RATIO.  Anticoagulation responsible provider: Angelena Form MD, Harrell Gave.  INR POC: 1.9.  Cuvette Lot#: [id number].  Exp: 04/2010.    Anticoagulation Management Assessment/Plan:      The patient's current anticoagulation dose is Coumadin 5 mg tabs: Take as directed by coumadin clinic..  The target INR is 2 - 3.  The next INR is due 03/30/2009.  Anticoagulation instructions were given to patient.  Results were reviewed/authorized by Tula Nakayama, RN, BSN.  He was notified by Tula Nakayama, RN, BSN.         Prior Anticoagulation Instructions: INR 1.7  Take 1 tablet today then resume same dosage 1 tablet daily except 1/2 tablet on Mondays, Wednesdays, and Fridays.  Recheck in 2 weeks.    Current Anticoagulation Instructions: INR 1.9 Today take 7.50ms then resume 561m daily except 2.15m62mon Mondays,  Wednesdays and Fridays. Recheck in 2-3 weeks.

## 2010-02-21 NOTE — Letter (Signed)
Summary: 2009-11/[facility]  2009-11/[facility]   Imported By: Bubba Hales 02/12/2010 08:45:07  _____________________________________________________________________  External Attachment:    Type:   Image     Comment:   External Document

## 2010-02-27 DIAGNOSIS — I4891 Unspecified atrial fibrillation: Secondary | ICD-10-CM

## 2010-03-08 ENCOUNTER — Encounter (INDEPENDENT_AMBULATORY_CARE_PROVIDER_SITE_OTHER): Payer: Medicare Other

## 2010-03-08 ENCOUNTER — Encounter: Payer: Self-pay | Admitting: Internal Medicine

## 2010-03-08 DIAGNOSIS — Z7901 Long term (current) use of anticoagulants: Secondary | ICD-10-CM

## 2010-03-08 DIAGNOSIS — I4891 Unspecified atrial fibrillation: Secondary | ICD-10-CM

## 2010-03-12 ENCOUNTER — Encounter: Payer: Self-pay | Admitting: Internal Medicine

## 2010-03-13 ENCOUNTER — Encounter: Payer: Self-pay | Admitting: Internal Medicine

## 2010-03-13 ENCOUNTER — Encounter (INDEPENDENT_AMBULATORY_CARE_PROVIDER_SITE_OTHER): Payer: Medicare Other | Admitting: Internal Medicine

## 2010-03-13 DIAGNOSIS — I4891 Unspecified atrial fibrillation: Secondary | ICD-10-CM

## 2010-03-13 DIAGNOSIS — I4729 Other ventricular tachycardia: Secondary | ICD-10-CM

## 2010-03-13 DIAGNOSIS — I5022 Chronic systolic (congestive) heart failure: Secondary | ICD-10-CM

## 2010-03-13 DIAGNOSIS — I472 Ventricular tachycardia: Secondary | ICD-10-CM

## 2010-03-22 NOTE — Miscellaneous (Signed)
Clinical Lists Changes  Observations: Added new observation of CXR RESULTS:  Findings: The lungs are clear.  Moderate cardiomegaly is stable.  A   permanent pacemaker remains with AICD lead    IMPRESSION:   Stable cardiomegaly.  No active lung disease.    Read By:  Joretta Bachelor,  M.D.   Released By:  Joretta Bachelor,  M.D.  (02/07/2010 11:39) Added new observation of CXR RESULTS:  Findings: Previously seen airspace disease has resolved.   Cardiomegaly remains present with unchanged AICD.  Scarring is   present at the left costophrenic angle.  There is no airspace   disease.  No effusions are present.  Pacemaker power pack overlies   the left chest.  Trachea midline.    IMPRESSION:   Cardiomegaly without failure.  No acute abnormality.    Read By:  Melvyn Novas   Released By:  Dereck Ligas T.  (12/29/2009 11:39) Added new observation of ECHOINTERP:   Study Conclusions    - Left ventricle: The cavity size was severely dilated. Wall     thickness was normal. Systolic function was severely reduced. The     estimated ejection fraction was in the range of 20% to 25%. There     is akinesis of the anteroseptal myocardium. There is akinesis of     the apical myocardium. There is akinesis of the inferior     myocardium.   - Mitral valve: Moderate regurgitation.   - Left atrium: The atrium was moderately dilated.   - Pulmonary arteries: Systolic pressure was mildly increased. PA     peak pressure: 33m Hg (S).    Prepared and Electronically Authenticated by    BKirk Ruths MD, FThe [facility]  [id number]   (12/11/2009 11:42) Added new observation of CT OF CHEST:  IMPRESSION:   Small bilateral effusions and bilateral airspace disease.    No pulmonary thromboembolism.    Liver cyst.    Read By:  HMarybelle Killings  M.D.   Released By:  HMarybelle Killings  M.D.  (12/10/2009 11:40)      CXR  Procedure date:  02/07/2010  Findings:       Findings: The lungs are  clear.  Moderate cardiomegaly is stable.  A   permanent pacemaker remains with AICD lead    IMPRESSION:   Stable cardiomegaly.  No active lung disease.    Read By:  BJoretta Bachelor  M.D.   Released By:  BJoretta Bachelor  M.D.   CXR  Procedure date:  12/29/2009  Findings:       Findings: Previously seen airspace disease has resolved.   Cardiomegaly remains present with unchanged AICD.  Scarring is   present at the left costophrenic angle.  There is no airspace   disease.  No effusions are present.  Pacemaker power pack overlies   the left chest.  Trachea midline.    IMPRESSION:   Cardiomegaly without failure.  No acute abnormality.    Read By:  LMelvyn Novas  Released By:  LMelvyn Novas  CT of Chest  Procedure date:  12/10/2009  Findings:       IMPRESSION:   Small bilateral effusions and bilateral airspace disease.    No pulmonary thromboembolism.    Liver cyst.    Read By:  HMarybelle Killings  M.D.   Released By:  HMarybelle Killings  M.D.   Echocardiogram  Procedure date:  12/11/2009  Findings:  Study Conclusions    - Left ventricle: The cavity size was severely dilated. Wall     thickness was normal. Systolic function was severely reduced. The     estimated ejection fraction was in the range of 20% to 25%. There     is akinesis of the anteroseptal myocardium. There is akinesis of     the apical myocardium. There is akinesis of the inferior     myocardium.   - Mitral valve: Moderate regurgitation.   - Left atrium: The atrium was moderately dilated.   - Pulmonary arteries: Systolic pressure was mildly increased. PA     peak pressure: 4m Hg (S).    Prepared and Electronically Authenticated by    BKirk Ruths MD, FDiginity [facility]-St.[facility]  2011-11-28T14:[id number]

## 2010-03-22 NOTE — Assessment & Plan Note (Signed)
Summary: wire  movement per paula/er on 3wks ago/ok per geslia/sl   Primary Provider:  Burnett Harry   History of Present Illness: [name] is seen in followup for congestive heart failure previously implanted ICD and CRT upgrade for congestive heart failure undertaken in December. He has had subsequent minimal dislodgment of his LV lead with loss of capture at all factors. Thankfully he has retained his functional status for now.   Last cardiac catheterization in October 2008 showed an occluded right coronary artery with left-to-right collateralization. Ejection fraction is 20%. There was no other coronary disease noted. Last Myoview in June of 2010 showed prior inferior infarct but no ischemia. His ejection fraction was 21%previous carotid Dopplers in April 2006 showed 0-39% stenosis bilaterally. Previous abdominal ultrasound in April 2006 showed no aneurysm.. I l  Current Medications (verified): 1)  Furosemide 40 Mg Tabs (Furosemide) .[name].. 1 Two Times A Day 2)  Nitroquick 0.4 Mg Subl (Nitroglycerin) .... Take By Mouth As Needed 3)  Saw Palmetto 450 Mg  Caps (Saw Palmetto (Serenoa Repens)) .... Take 1 Tablet By Mouth Two Times A Day 4)  Multivitamins   Caps (Multiple Vitamin) .... Take 1 Tablet By Mouth Once A Day 5)  Mexiletine Hcl 150 Mg  Caps (Mexiletine Hcl) .... Take 2 Tabs By Mouth Two Times A Day 6)  Diovan 80 Mg Tabs (Valsartan) .... One Tablet By Mouth Once Daily 7)  Amiodarone Hcl 200 Mg Tabs (Amiodarone Hcl) .... Take 1 and 1/2 Tablets Daily. 8)  Crestor 20 Mg Tabs (Rosuvastatin Calcium) .... Take One Tablet By Mouth Daily. 9)  Coumadin 5 Mg Tabs (Warfarin Sodium) .... Take As Directed By Coumadin Clinic. 10)  Coreg 25 Mg Tabs (Carvedilol) .... Take 1 1/2 Tabs By Mouth Two Times A Day 11)  Gnp Flax Seed Oil 1000 Mg Caps (Flaxseed (Linseed)) .... Take 1 Capsule By Mouth Two Times A Day 12)  Dulcolax Stool Softener 100 Mg Caps (Docusate Sodium) .[name].. 1 Tab By Mouth Two Times A Day 13)   Preservision/lutein  Caps (Multiple Vitamins-Minerals) .[name].. 1 Tab By Mouth Once Daily 14)  Align  Caps (Probiotic Product) .... Take One Capsule Daily 15)  Xyzal 5 Mg Tabs (Levocetirizine Dihydrochloride) .... Once Daily 16)  Caltrate 600 1500 Mg Tabs (Calcium Carbonate) .... Take 1 Tablet By Mouth Two Times A Day 17)  Klor-Con M20 20 Meq Cr-Tabs (Potassium Chloride Crys Cr) .... Take 1 Tablet By Mouth Once Daily 18)  Fluticasone Propionate 50 Mcg/act Susp (Fluticasone Propionate) .... 2 Sprays in Each Nostril Once Daily 19)  Proventil Hfa 108 (90 Base) Mcg/act Aers (Albuterol Sulfate) .... 2 Puffs As Needed For Inhalation Every 4 Hours As Needed 20)  Coral Calcium 250-125-100 Mg-Unit Caps (Coral Calcium-Magnesium-Vit D) .... Take One Capsule Once Daily  Allergies (verified): 1)  ! Sulfa  Past History:  Past Medical History: Last updated: 02/06/2010 ICD - IN SITU (ICD-V45.02) ATRIAL FIBRILLATION, HX OF (ICD-V12.59) CARDIOMYOPATHY (ICD-425.4) HYPERTENSION (ICD-401.9) HYPERLIPIDEMIA (ICD-272.4) ALLERGIC RHINITIS CAUSE UNSPECIFIED (ICD-477.9) OBSTRUCTIVE SLEEP APNEA (ICD-327.23) Coronary Artery Disease Ventricular tachycardia Adenomatous colon polyp (+diverticulosis) 07/2007    Past Surgical History: Last updated: 05/09/2008 ablation ICD - Guidant Contak H170 - Deboraha Sprang, MD - 07/25/06 (2nd device)  Family History: Last updated: 05/09/2008 heart disease: mother cancer: maternal grandmoter (breast)  Family History of Coronary Artery Disease:  Family History of Hypertension:   Social History: Last updated: 05/09/2008 Patient states former smoker.  pt is married. pt has children. pt is retired. Pt  owned a company.  Alcohol Use - yes - socially  Vital Signs:  Patient profile:   70 year old male Height:      72 inches Weight:      239 pounds BMI:     32.53 Pulse rate:   60 / minute Pulse rhythm:   regular BP sitting:   124 / 78  (left arm) Cuff size:    large  Vitals Entered By: Doug Sou CMA (March 13, 2010 8:51 AM)  Physical Exam  General:  The patient was alert and oriented in no acute distress. HEENT Normal.  Neck veins were flat, carotids were brisk.  Lungs were clear.  Heart sounds were regular without murmurs or gallops.  Abdomen was soft with active bowel sounds. There is no clubbing cyanosis or edema. Skin Warm and dry     ICD Specifications Following MD:  Virl Axe, MD     Referring MD:  Stanford Breed ICD Vendor:  [place] Number:  [id number]     ICD Serial Number:  [id number] ICD DOI:  12/20/2009     ICD Implanting MD:  Virl Axe, MD  Lead 1:    Location: RA     DOI: 06/09/2000     Model #: [id number]     Serial #: [id number]     Status: active Lead 2:    Location: RV     DOI: 06/09/2000     Model #: [id number]     Serial #: [id number]     Status: active Lead 3:    Location: LV     DOI: 12/20/2009     Model #: [id number]     Serial #: [id number]     Status: active  Indications::  ICM  Explantation Comments: 12/20/09 [employer] H170/36626 explanted  ICD Follow Up ICD Dependent:  No      Episodes Coumadin:  Yes  Brady Parameters Mode DDDR     Lower Rate Limit:  60     Upper Rate Limit 110 PAV 180     Sensed AV Delay:  300  Tachy Zones VF:  200     VT:  180     VT1:  100     Impression & Recommendations:  Problem # 1:  DISLODGED-MICRO-LEFT VENTRICULAR LEAD WITH LOSS OF CAPTURE (ICD-996.04) the patient has electrocardiographic and functional loss of left ventricular capture. This is further confirmed by chest x-ray review today. I recommend that we undertake replacement of the left ventricular lead and my thought at this time would be to place the [facility]. Jude quadripolar system. This would give [place] our largest is options. As he is now 2 months post implantation, I will need to review with Dr. Lovena Le in the Cath Lab as to whether this is best done with his extraction skills. We have reviewed the potential benefits  as well as risks include but are limited to infection and bleeding. He will wait to hear from [place].  Problem # 2:  ATRIAL FIBRILLATION (ICD-427.31) holding sinus rhythm on Coumadin His updated medication list for this problem includes:    Mexiletine Hcl 150 Mg Caps (Mexiletine hcl) .[name]... Take 2 tabs by mouth two times a day    Amiodarone Hcl 200 Mg Tabs (Amiodarone hcl) .[name]... Take 1 and 1/2 tablets daily.    Coumadin 5 Mg Tabs (Warfarin sodium) .[name]... Take as directed by coumadin clinic.    Coreg 25 Mg Tabs (Carvedilol) .[name]... Take 1 1/2 tabs  by mouth two times a day  Orders: EKG w/ Interpretation (93000)  Problem # 3:  VENTRICULAR TACHYCARDIA (ICD-427.1) no intercurrent ventricular tachycardia His updated medication list for this problem includes:    Nitroquick 0.4 Mg Subl (Nitroglycerin) .[name]... Take by mouth as needed    Mexiletine Hcl 150 Mg Caps (Mexiletine hcl) .[name]... Take 2 tabs by mouth two times a day    Amiodarone Hcl 200 Mg Tabs (Amiodarone hcl) .[name]... Take 1 and 1/2 tablets daily.    Coumadin 5 Mg Tabs (Warfarin sodium) .[name]... Take as directed by coumadin clinic.    Coreg 25 Mg Tabs (Carvedilol) .[name]... Take 1 1/2 tabs by mouth two times a day  Problem # 4:  SYSTOLIC HEART FAILURE, CHRONIC (ICD-428.22) surprisingly stable. I have left him paced at 180 ms which results in RV apical pacing. This may be better than a narrow QRS associated with a PR interval of 300 ms or so at least based on his current symptoms. His updated medication list for this problem includes:    Furosemide 40 Mg Tabs (Furosemide) .[name]... 1 two times a day    Nitroquick 0.4 Mg Subl (Nitroglycerin) .[name]... Take by mouth as needed    Mexiletine Hcl 150 Mg Caps (Mexiletine hcl) .[name]... Take 2 tabs by mouth two times a day    Diovan 80 Mg Tabs (Valsartan) ..... One tablet by mouth once daily    Amiodarone Hcl 200 Mg Tabs (Amiodarone hcl) .[name]... Take 1 and 1/2 tablets daily.    Coumadin 5 Mg Tabs (Warfarin sodium) .[name]... Take as  directed by coumadin clinic.    Coreg 25 Mg Tabs (Carvedilol) .[name]... Take 1 1/2 tabs by mouth two times a day  Problem # 5:  CARDIOMYOPATHY, ISCHEMIC (ICD-414.8) stable on current medications His updated medication list for this problem includes:    Furosemide 40 Mg Tabs (Furosemide) .[name]... 1 two times a day    Nitroquick 0.4 Mg Subl (Nitroglycerin) .[name]... Take by mouth as needed    Mexiletine Hcl 150 Mg Caps (Mexiletine hcl) .[name]... Take 2 tabs by mouth two times a day    Diovan 80 Mg Tabs (Valsartan) ..... One tablet by mouth once daily    Amiodarone Hcl 200 Mg Tabs (Amiodarone hcl) .[name]... Take 1 and 1/2 tablets daily.    Coumadin 5 Mg Tabs (Warfarin sodium) .[name]... Take as directed by coumadin clinic.    Coreg 25 Mg Tabs (Carvedilol) .[name]... Take 1 1/2 tabs by mouth two times a day

## 2010-03-26 LAB — BASIC METABOLIC PANEL
CO2: 28 mEq/L (ref 19–32)
Calcium: 9 mg/dL (ref 8.4–10.5)
Creatinine, Ser: 1.02 mg/dL (ref 0.4–1.5)
GFR calc Af Amer: >60 mL/min (ref >60)
GFR calc non Af Amer: >60 mL/min (ref >60)
Glucose, Bld: 108 mg/dL — ABNORMAL HIGH (ref 70–99)
Sodium: 139 mEq/L (ref 135–145)

## 2010-03-26 LAB — PROTIME-INR
INR: 1.5 — ABNORMAL HIGH (ref 0.00–1.49)
Prothrombin Time: 18.3 seconds — ABNORMAL HIGH (ref 11.6–15.2)

## 2010-03-26 LAB — CBC
MCH: 33.8 pg (ref 26.0–34.0)
MCHC: 33.3 g/dL (ref 30.0–36.0)
Platelets: 449 10*3/uL — ABNORMAL HIGH (ref 150–400)

## 2010-03-26 NOTE — Progress Notes (Signed)
Addended byTula Nakayama on: 03/26/2010 12:19 PM   Modules accepted: Orders

## 2010-03-27 LAB — BASIC METABOLIC PANEL
BUN: 16 mg/dL (ref 6–23)
CO2: 23 mEq/L (ref 19–32)
CO2: 27 mEq/L (ref 19–32)
Calcium: 8.5 mg/dL (ref 8.4–10.5)
Calcium: 8.5 mg/dL (ref 8.4–10.5)
Chloride: 101 mEq/L (ref 96–112)
Chloride: 103 mEq/L (ref 96–112)
Creatinine, Ser: 0.99 mg/dL (ref 0.4–1.5)
Creatinine, Ser: 1.01 mg/dL (ref 0.4–1.5)
GFR calc Af Amer: >60 mL/min (ref >60)
GFR calc Af Amer: >60 mL/min (ref >60)
GFR calc non Af Amer: >60 mL/min (ref >60)
GFR calc non Af Amer: >60 mL/min (ref >60)
Glucose, Bld: 103 mg/dL — ABNORMAL HIGH (ref 70–99)
Glucose, Bld: 113 mg/dL — ABNORMAL HIGH (ref 70–99)
Potassium: 3 mEq/L — ABNORMAL LOW (ref 3.5–5.1)
Potassium: 3.6 mEq/L (ref 3.5–5.1)
Sodium: 138 mEq/L (ref 135–145)
Sodium: 139 mEq/L (ref 135–145)

## 2010-03-27 LAB — CARDIAC PANEL(CRET KIN+CKTOT+MB+TROPI)
CK, MB: 1.2 ng/mL (ref 0.3–4.0)
Relative Index: INVALID (ref 0.0–2.5)
Total CK: 95 U/L (ref 7–232)
Troponin I: 0.04 ng/mL (ref 0.00–0.06)

## 2010-03-27 LAB — DIFFERENTIAL
Basophils Absolute: 0 10*3/uL (ref 0.0–0.1)
Eosinophils Absolute: 0.1 10*3/uL (ref 0.0–0.7)
Eosinophils Relative: 1 % (ref 0–5)
Lymphocytes Relative: 12 % (ref 12–46)
Monocytes Absolute: 1.3 10*3/uL — ABNORMAL HIGH (ref 0.1–1.0)

## 2010-03-27 LAB — URINALYSIS, ROUTINE W REFLEX MICROSCOPIC
Ketones, ur: NEGATIVE mg/dL
Leukocytes, UA: NEGATIVE
Nitrite: NEGATIVE
Protein, ur: 30 mg/dL — AB
pH: 6.5 (ref 5.0–8.0)

## 2010-03-27 LAB — CK TOTAL AND CKMB (NOT AT ARMC)
CK, MB: 1.8 ng/mL (ref 0.3–4.0)
Total CK: 111 U/L (ref 7–232)

## 2010-03-27 LAB — POCT CARDIAC MARKERS
CKMB, poc: 1 ng/mL (ref 1.0–8.0)
Troponin i, poc: <0.05 ng/mL (ref 0.00–0.09)

## 2010-03-27 LAB — CBC
HCT: 36.8 % — ABNORMAL LOW (ref 39.0–52.0)
HCT: 41 % (ref 39.0–52.0)
Hemoglobin: 12.3 g/dL — ABNORMAL LOW (ref 13.0–17.0)
MCH: 34.2 pg — ABNORMAL HIGH (ref 26.0–34.0)
MCHC: 33.7 g/dL (ref 30.0–36.0)
MCV: 101.5 fL — ABNORMAL HIGH (ref 78.0–100.0)
Platelets: 319 10*3/uL (ref 150–400)
RBC: 3.64 MIL/uL — ABNORMAL LOW (ref 4.22–5.81)
RDW: 14 % (ref 11.5–15.5)
WBC: 11.9 10*3/uL — ABNORMAL HIGH (ref 4.0–10.5)
WBC: 9.3 10*3/uL (ref 4.0–10.5)

## 2010-03-27 LAB — HEPATIC FUNCTION PANEL
ALT: 35 U/L (ref 0–53)
AST: 32 U/L (ref 0–37)
Albumin: 2.8 g/dL — ABNORMAL LOW (ref 3.5–5.2)
Alkaline Phosphatase: 57 U/L (ref 39–117)
Total Bilirubin: 1.1 mg/dL (ref 0.3–1.2)

## 2010-03-27 LAB — POCT I-STAT 3, ART BLOOD GAS (G3+)
Acid-base deficit: 2 mmol/L (ref 0.0–2.0)
pH, Arterial: 7.44 (ref 7.350–7.450)
pO2, Arterial: 98 mmHg (ref 80.0–100.0)

## 2010-03-27 LAB — PROTIME-INR
INR: 2.88 — ABNORMAL HIGH (ref 0.00–1.49)
INR: 3.32 — ABNORMAL HIGH (ref 0.00–1.49)

## 2010-03-27 LAB — TROPONIN I: Troponin I: 0.03 ng/mL (ref 0.00–0.06)

## 2010-03-27 LAB — GLUCOSE, CAPILLARY: Glucose-Capillary: 108 mg/dL — ABNORMAL HIGH (ref 70–99)

## 2010-03-29 ENCOUNTER — Ambulatory Visit (INDEPENDENT_AMBULATORY_CARE_PROVIDER_SITE_OTHER): Payer: Medicare Other | Admitting: Cardiology

## 2010-03-29 ENCOUNTER — Ambulatory Visit (HOSPITAL_COMMUNITY): Payer: Medicare Other | Attending: Cardiology

## 2010-03-29 ENCOUNTER — Encounter (INDEPENDENT_AMBULATORY_CARE_PROVIDER_SITE_OTHER): Payer: Medicare Other

## 2010-03-29 ENCOUNTER — Encounter: Payer: Self-pay | Admitting: Cardiology

## 2010-03-29 ENCOUNTER — Encounter: Payer: Self-pay | Admitting: Internal Medicine

## 2010-03-29 DIAGNOSIS — I2510 Atherosclerotic heart disease of native coronary artery without angina pectoris: Secondary | ICD-10-CM

## 2010-03-29 DIAGNOSIS — Z7901 Long term (current) use of anticoagulants: Secondary | ICD-10-CM

## 2010-03-29 DIAGNOSIS — G4733 Obstructive sleep apnea (adult) (pediatric): Secondary | ICD-10-CM | POA: Insufficient documentation

## 2010-03-29 DIAGNOSIS — I2589 Other forms of chronic ischemic heart disease: Secondary | ICD-10-CM

## 2010-03-29 DIAGNOSIS — I10 Essential (primary) hypertension: Secondary | ICD-10-CM | POA: Insufficient documentation

## 2010-03-29 DIAGNOSIS — I428 Other cardiomyopathies: Secondary | ICD-10-CM | POA: Insufficient documentation

## 2010-03-29 DIAGNOSIS — I4891 Unspecified atrial fibrillation: Secondary | ICD-10-CM | POA: Insufficient documentation

## 2010-04-03 ENCOUNTER — Encounter: Payer: Medicare Other | Admitting: *Deleted

## 2010-04-03 ENCOUNTER — Encounter: Payer: Self-pay | Admitting: Family Medicine

## 2010-04-03 NOTE — Medication Information (Signed)
Summary: rov/sp  Anticoagulant Therapy  Managed by: Margaretha Sheffield, PharmD Referring MD: Kirk Ruths MD PCP: Theodoro Kalata MD: Lovena Le MD, Carleene Overlie Indication 1: Atrial Fibrillation (ICD-427.31) Lab Used: [facility]  Site: [facility] INR POC 2.4 INR RANGE 2 - 3  Dietary changes: no    Health status changes: no    Bleeding/hemorrhagic complications: no    Recent/future hospitalizations: no    Any changes in medication regimen? no    Recent/future dental: no  Any missed doses?: no       Is patient compliant with meds? yes       Current Medications (verified): 1)  Furosemide 40 Mg Tabs (Furosemide) .[name].. 1 Two Times A Day 2)  Nitroquick 0.4 Mg Subl (Nitroglycerin) .... Take By Mouth As Needed 3)  Saw Palmetto 450 Mg  Caps (Saw Palmetto (Serenoa Repens)) .... Take 1 Tablet By Mouth Two Times A Day 4)  Multivitamins   Caps (Multiple Vitamin) .... Take 1 Tablet By Mouth Once A Day 5)  Mexiletine Hcl 150 Mg  Caps (Mexiletine Hcl) .... Take 2 Tabs By Mouth Two Times A Day 6)  Diovan 80 Mg Tabs (Valsartan) .... One Tablet By Mouth Once Daily 7)  Amiodarone Hcl 200 Mg Tabs (Amiodarone Hcl) .... Take 1 and 1/2 Tablets Daily. 8)  Crestor 20 Mg Tabs (Rosuvastatin Calcium) .... Take One Tablet By Mouth Daily. 9)  Coumadin 5 Mg Tabs (Warfarin Sodium) .... Take As Directed By Coumadin Clinic. 10)  Coreg 25 Mg Tabs (Carvedilol) .... Take 1 1/2 Tabs By Mouth Two Times A Day 11)  Gnp Flax Seed Oil 1000 Mg Caps (Flaxseed (Linseed)) .... Take 1 Capsule By Mouth Two Times A Day 12)  Dulcolax Stool Softener 100 Mg Caps (Docusate Sodium) .[name].. 1 Tab By Mouth Two Times A Day 13)  Preservision/lutein  Caps (Multiple Vitamins-Minerals) .[name].. 1 Tab By Mouth Once Daily 14)  Align  Caps (Probiotic Product) .... Take One Capsule Daily 15)  Xyzal 5 Mg Tabs (Levocetirizine Dihydrochloride) .... Once Daily 16)  Caltrate 600 1500 Mg Tabs (Calcium Carbonate) .... Take 1 Tablet By Mouth Two Times A  Day 17)  Klor-Con M20 20 Meq Cr-Tabs (Potassium Chloride Crys Cr) .... Take 1 Tablet By Mouth Once Daily 18)  Fluticasone Propionate 50 Mcg/act Susp (Fluticasone Propionate) .... 2 Sprays in Each Nostril Once Daily 19)  Proventil Hfa 108 (90 Base) Mcg/act Aers (Albuterol Sulfate) .... 2 Puffs As Needed For Inhalation Every 4 Hours As Needed  Allergies (verified): 1)  ! Sulfa  Anticoagulation Management History:      The patient is taking warfarin and comes in today for a routine follow up visit.  Positive risk factors for bleeding include an age of 39 years or older.  The bleeding index is 'intermediate risk'.  Positive CHADS2 values include History of CHF and History of HTN.  Negative CHADS2 values include Age > 51 years old.  The start date was 10/20/2002.  His last INR was 2.5.  Anticoagulation responsible provider: Lovena Le MD, Carleene Overlie.  INR POC: 2.4.  Cuvette Lot#: [id number].  Exp: 02/2011.    Anticoagulation Management Assessment/Plan:      The patient's current anticoagulation dose is Coumadin 5 mg tabs: Take as directed by coumadin clinic..  The target INR is 2 - 3.  The next INR is due 04/05/2010.  Anticoagulation instructions were given to patient.  Results were reviewed/authorized by Margaretha Sheffield, PharmD.  He was notified by Margaretha Sheffield.  Prior Anticoagulation Instructions: INR 2.3 Continue taking 1/2 tablet every day except take 1 tablet on Mondays, Wednesdays, and Fridays. Recheck INR in 4 weeks.  Current Anticoagulation Instructions: INR 2.4  Continue taking 1 tablet on Monday, Wednesday, and Friday and 1/2 tablet all other days.  Return to clinic in 4 weeks.

## 2010-04-03 NOTE — Medication Information (Signed)
Summary: rov/eac, ECHO at 10:30 am  Anticoagulant Therapy  Managed by: Tula Nakayama, RN, BSN Referring MD: Kirk Ruths MD PCP: Theodoro Kalata MD: Rayann Heman MD, Jeneen Rinks Indication 1: Atrial Fibrillation (ICD-427.31) Lab Used: [facility] Site: [facility] INR POC 1.7 INR RANGE 2 - 3  Dietary changes: yes       Details: Eating more salads  Health status changes: no    Bleeding/hemorrhagic complications: no    Recent/future hospitalizations: no    Any changes in medication regimen? yes       Details: lasix and potassium increased  Recent/future dental: no  Any missed doses?: no       Is patient compliant with meds? yes      Comments: Pending device replacement no date set   Allergies: 1)  ! Sulfa  Anticoagulation Management History:      The patient is taking warfarin and comes in today for a routine follow up visit.  Positive risk factors for bleeding include an age of 57 years or older.  The bleeding index is 'intermediate risk'.  Positive CHADS2 values include History of CHF and History of HTN.  Negative CHADS2 values include Age > 20 years old.  The start date was 10/20/2002.  His last INR was 2.5.  Anticoagulation responsible provider: Denetria Luevanos MD, Jeneen Rinks.  INR POC: 1.7.  Cuvette Lot#: [id number].  Exp: 03/2011.    Anticoagulation Management Assessment/Plan:      The patient's current anticoagulation dose is Coumadin 5 mg tabs: Take as directed by coumadin clinic..  The target INR is 2 - 3.  The next INR is due 04/12/2010.  Anticoagulation instructions were given to patient.  Results were reviewed/authorized by Tula Nakayama, RN, BSN.  He was notified by Tula Nakayama, RN, BSN.         Prior Anticoagulation Instructions: INR 2.4  Continue taking 1 tablet on Monday, Wednesday, and Friday and 1/2 tablet all other days.  Return to clinic in 4 weeks.    Current Anticoagulation Instructions: INR 1.7 Today take 44ms then resume 2.539m everyday except 64m78mon Mondays, Wednesdays  and Fridays. Recheck in 2 weeks.

## 2010-04-03 NOTE — Assessment & Plan Note (Signed)
Summary: F3M per check out/having test at10:30/saf/car   Visit Type:  Follow-up Primary Provider:  Burnett Harry   History of Present Illness: [name] is a pleasant gentleman with a history of coronary artery disease, mixed ischemic and nonischemic cardiomyopathy, history of ICD, paroxysmal atrial fibrillation, and ventricular tachycardia. Previous carotid Dopplers in April 2006 showed 0-39% stenosis bilaterally. Previous abdominal ultrasound in April 2006 showed no aneurysm. Last cardiac catheterization in October 2008 showed an occluded right coronary artery with left-to-right collateralization. Ejection fraction is 20%. There was no other coronary disease noted. Last Myoview in Dec 2011 showed prior inferior infarct but no ischemia. His ejection fraction was 19%. ABIs in December of 2011 were normal. An echocardiogram in Nov 2011 showed an ejection fraction of 20-25%, moderate left atrial enlargement and moderate mitral regurgitation. The patient subsequently had upgrade of his device to CRT-D. I last saw him in Dec 2011. Since then, the patient denies any dyspnea on exertion, orthopnea, PND, pedal edema, palpitations, syncope or chest pain.   Current Medications (verified): 1)  Furosemide 40 Mg Tabs (Furosemide) .[name].. 1 Two Times A Day 2)  Nitroquick 0.4 Mg Subl (Nitroglycerin) .... Take By Mouth As Needed 3)  Saw Palmetto 450 Mg  Caps (Saw Palmetto (Serenoa Repens)) .... Take 1 Tablet By Mouth Two Times A Day 4)  Multivitamins   Caps (Multiple Vitamin) .... Take 1 Tablet By Mouth Once A Day 5)  Mexiletine Hcl 150 Mg  Caps (Mexiletine Hcl) .... Take 2 Tabs By Mouth Two Times A Day 6)  Diovan 80 Mg Tabs (Valsartan) .... One Tablet By Mouth Once Daily 7)  Amiodarone Hcl 200 Mg Tabs (Amiodarone Hcl) .... Take 1 and 1/2 Tablets Daily. 8)  Crestor 20 Mg Tabs (Rosuvastatin Calcium) .... Take One Tablet By Mouth Daily. 9)  Coumadin 5 Mg Tabs (Warfarin Sodium) .... Take As Directed By Coumadin  Clinic. 10)  Coreg 25 Mg Tabs (Carvedilol) .... Take 1 1/2 Tabs By Mouth Two Times A Day 11)  Gnp Flax Seed Oil 1000 Mg Caps (Flaxseed (Linseed)) .... Take 1 Capsule By Mouth Two Times A Day 12)  Preservision/lutein  Caps (Multiple Vitamins-Minerals) .[name].. 1 Tab By Mouth Once Daily 13)  Xyzal 5 Mg Tabs (Levocetirizine Dihydrochloride) .... Once Daily 14)  Caltrate 600 1500 Mg Tabs (Calcium Carbonate) .... Take 1 Tablet By Mouth Two Times A Day 15)  Klor-Con M20 20 Meq Cr-Tabs (Potassium Chloride Crys Cr) .... Take 1 Tablet By Mouth Once Daily 16)  Fluticasone Propionate 50 Mcg/act Susp (Fluticasone Propionate) .... 2 Sprays in Each Nostril Once Daily 17)  Coral Calcium 250-125-100 Mg-Unit Caps (Coral Calcium-Magnesium-Vit D) .... Take One Capsule Once Daily  Allergies (verified): 1)  ! Sulfa  Past History:  Past Medical History: Reviewed history from 02/06/2010 and no changes required. ICD - IN SITU (ICD-V45.02) ATRIAL FIBRILLATION, HX OF (ICD-V12.59) CARDIOMYOPATHY (ICD-425.4) HYPERTENSION (ICD-401.9) HYPERLIPIDEMIA (ICD-272.4) ALLERGIC RHINITIS CAUSE UNSPECIFIED (ICD-477.9) OBSTRUCTIVE SLEEP APNEA (ICD-327.23) Coronary Artery Disease Ventricular tachycardia Adenomatous colon polyp (+diverticulosis) 07/2007    Past Surgical History: Reviewed history from 05/09/2008 and no changes required. ablation ICD - Guidant Contak H170 - Deboraha Sprang, MD - 07/25/06 (2nd device)  Social History: Reviewed history from 05/09/2008 and no changes required. Patient states former smoker.  pt is married. pt has children. pt is retired. Pt owned a company.  Alcohol Use - yes - socially  Review of Systems       no fevers or chills, productive cough, hemoptysis, dysphasia, odynophagia,  melena, hematochezia, dysuria, hematuria, rash, seizure activity, orthopnea, PND, pedal edema, claudication. Remaining systems are negative.   Vital Signs:  Patient profile:   70 year old male Height:       72 inches Weight:      238.25 pounds BMI:     32.43 Pulse rate:   60 / minute BP sitting:   109 / 75  Vitals Entered By: Hansel Feinstein CMA (March 29, 2010 11:54 AM)  Physical Exam  General:  Well-developed well-nourished in no acute distress.  Skin is warm and dry.  HEENT is normal.  Neck is supple. No thyromegaly.  Chest is clear to auscultation with normal expansion.  Cardiovascular exam is regular rate and rhythm.  Abdominal exam nontender or distended. No masses palpated. Extremities show no edema. neuro grossly intact    EKG  Procedure date:  03/29/2010  Findings:      Sinus rhythm with ventricular pacing.   ICD Specifications Following MD:  Virl Axe, MD     Referring MD:  Stanford Breed ICD Vendor:  [place] Number:  [phone]     ICD Serial Number:  [id number] ICD DOI:  12/20/2009     ICD Implanting MD:  Virl Axe, MD  Lead 1:    Location: RA     DOI: 06/09/2000     Model #: [id number]     Serial #: [id number]     Status: active Lead 2:    Location: RV     DOI: 06/09/2000     Model #: [id number]     Serial #: [id number]     Status: active Lead 3:    Location: LV     DOI: 12/20/2009     Model #: [id number]     Serial #: [id number]     Status: active  Indications::  ICM  Explantation Comments: 12/20/09 [employer] H170/36626 explanted  ICD Follow Up ICD Dependent:  No      Episodes Coumadin:  Yes  Brady Parameters Mode DDDR     Lower Rate Limit:  60     Upper Rate Limit 110 PAV 180     Sensed AV Delay:  300  Tachy Zones VF:  200     VT:  180     VT1:  100     Impression & Recommendations:  Problem # 1:  DISLODGED-MICRO-LEFT VENTRICULAR LEAD WITH LOSS OF CAPTURE (ICD-996.04) Pt scheduled for lead revision with Dr. Lovena Le.  Problem # 2:  PURE HYPERCHOLESTEROLEMIA (ICD-272.0)  Continue statin. Check lipids and liver. His updated medication list for this problem includes:    Crestor 20 Mg Tabs (Rosuvastatin calcium) .[name]... Take one tablet by mouth  daily.  His updated medication list for this problem includes:    Crestor 20 Mg Tabs (Rosuvastatin calcium) .[name]... Take one tablet by mouth daily.  Problem # 3:  SYSTOLIC HEART FAILURE, CHRONIC (ICD-428.22) Continue present medications. Check potassium and renal function. His updated medication list for this problem includes:    Furosemide 40 Mg Tabs (Furosemide) .[name]... 1 two times a day    Nitroquick 0.4 Mg Subl (Nitroglycerin) .[name]... Take by mouth as needed    Mexiletine Hcl 150 Mg Caps (Mexiletine hcl) .[name]... Take 2 tabs by mouth two times a day    Diovan 80 Mg Tabs (Valsartan) ..... One tablet by mouth once daily    Amiodarone Hcl 200 Mg Tabs (Amiodarone hcl) .[name]... Take 1 and 1/2 tablets daily.  Coumadin 5 Mg Tabs (Warfarin sodium) .[name]... Take as directed by coumadin clinic.    Coreg 25 Mg Tabs (Carvedilol) .[name]... Take 1 1/2 tabs by mouth two times a day  Problem # 4:  COUMADIN THERAPY (ICD-V58.61) Monitored in the Coumadin clinic. Goal INR 2-3.  Problem # 5:  ATRIAL FIBRILLATION (ICD-427.31) Continue amiodarone and Coumadin. Check TSH and liver functions. Recent chest x-ray unremarkable. His updated medication list for this problem includes:    Mexiletine Hcl 150 Mg Caps (Mexiletine hcl) .[name]... Take 2 tabs by mouth two times a day    Amiodarone Hcl 200 Mg Tabs (Amiodarone hcl) .[name]... Take 1 and 1/2 tablets daily.    Coumadin 5 Mg Tabs (Warfarin sodium) .[name]... Take as directed by coumadin clinic.    Coreg 25 Mg Tabs (Carvedilol) .[name]... Take 1 1/2 tabs by mouth two times a day  Problem # 6:  VENTRICULAR TACHYCARDIA (ICD-427.1) Continue present medications. His updated medication list for this problem includes:    Nitroquick 0.4 Mg Subl (Nitroglycerin) .[name]... Take by mouth as needed    Mexiletine Hcl 150 Mg Caps (Mexiletine hcl) .[name]... Take 2 tabs by mouth two times a day    Amiodarone Hcl 200 Mg Tabs (Amiodarone hcl) .[name]... Take 1 and 1/2 tablets daily.    Coumadin 5 Mg Tabs (Warfarin  sodium) .[name]... Take as directed by coumadin clinic.    Coreg 25 Mg Tabs (Carvedilol) .[name]... Take 1 1/2 tabs by mouth two times a day  Problem # 7:  ICD - CRT (ICD-V45.02)  Problem # 8:  HYPERTENSION (ICD-401.9) Blood pressure controlled with present medications. Will continue. His updated medication list for this problem includes:    Furosemide 40 Mg Tabs (Furosemide) .[name]... 1 two times a day    Diovan 80 Mg Tabs (Valsartan) ..... One tablet by mouth once daily    Coreg 25 Mg Tabs (Carvedilol) .[name]... Take 1 1/2 tabs by mouth two times a day  Problem # 9:  OBSTRUCTIVE SLEEP APNEA (ICD-327.23)  Problem # 10:  CAD (ICD-414.00) Continue present medications. Not on aspirin as he is taking Coumadin. His updated medication list for this problem includes:    Nitroquick 0.4 Mg Subl (Nitroglycerin) .[name]... Take by mouth as needed    Coumadin 5 Mg Tabs (Warfarin sodium) .[name]... Take as directed by coumadin clinic.    Coreg 25 Mg Tabs (Carvedilol) .[name]... Take 1 1/2 tabs by mouth two times a day  Patient Instructions: 1)  Your physician recommends that you return for lab work in: WITH NEXT COUMADIN APPT 2)  Your physician wants you to follow-up in: [place] will receive a reminder letter in the mail two months in advance. If you don't receive a letter, please call our office to schedule the follow-up appointment.

## 2010-04-04 ENCOUNTER — Encounter: Payer: Self-pay | Admitting: Family Medicine

## 2010-04-04 ENCOUNTER — Telehealth: Payer: Self-pay | Admitting: Family Medicine

## 2010-04-04 ENCOUNTER — Ambulatory Visit (INDEPENDENT_AMBULATORY_CARE_PROVIDER_SITE_OTHER): Payer: Medicare Other | Admitting: Family Medicine

## 2010-04-04 VITALS — BP 114/76 | HR 65 | Temp 97.8°F | Ht 70.0 in | Wt 240.4 lb

## 2010-04-04 DIAGNOSIS — Z23 Encounter for immunization: Secondary | ICD-10-CM

## 2010-04-04 DIAGNOSIS — I4891 Unspecified atrial fibrillation: Secondary | ICD-10-CM

## 2010-04-04 DIAGNOSIS — I10 Essential (primary) hypertension: Secondary | ICD-10-CM

## 2010-04-04 DIAGNOSIS — R3129 Other microscopic hematuria: Secondary | ICD-10-CM

## 2010-04-04 DIAGNOSIS — I5022 Chronic systolic (congestive) heart failure: Secondary | ICD-10-CM

## 2010-04-04 MED ORDER — TETANUS-DIPHTH-ACELL PERTUSSIS 5-2.5-18.5 LF-MCG/0.5 IM SUSP
0.5000 mL | Freq: Once | INTRAMUSCULAR | Status: AC
  Administered 2010-04-04: 0.5 mL via INTRAMUSCULAR

## 2010-04-04 NOTE — Progress Notes (Deleted)
Past Medical History  Diagnosis Date  . Atrial fibrillation   . Cardiomyopathy   . Hypertension   . Hyperlipidemia   . Allergy     rhinitis  . Obstructive sleep apnea   . CAD (coronary artery disease)   . Ventricular tachycardia   . Adenomatous colon polyp   . Kidney stones    ROS   Past Surgical History  Procedure Date  . Icd     Guidant Contak [id number]- Virl Axe, MD 07/25/06 (second device)    Current Outpatient Prescriptions on File Prior to Visit  Medication Sig Dispense Refill  . amiodarone (PACERONE) 200 MG tablet Take by mouth. 1 and 1/2 292m tabs po qd       . carvedilol (COREG) 25 MG tablet Take by mouth. 1 and 1/2 tabs po bid       . Coral Calcium-Magnesium-Vit D 250-125-100 MG-MG-UNIT CAPS Take 1 capsule by mouth daily.        . Flaxseed, Linseed, (FLAX SEED OIL) 1000 MG CAPS Take 1 capsule by mouth 2 (two) times daily.        . fluticasone (VERAMYST) 27.5 MCG/SPRAY nasal spray 2 sprays by Nasal route daily.        . furosemide (LASIX) 40 MG tablet Take 40 mg by mouth 2 (two) times daily.        .Marland Kitchenlevocetirizine (XYZAL) 5 MG tablet Take 5 mg by mouth every evening.        . mexiletine (MEXITIL) 150 MG capsule Take 300 mg by mouth 2 (two) times daily. 2 tabs bid       . nitroGLYCERIN (NITROSTAT) 0.4 MG SL tablet Place 0.4 mg under the tongue every 5 (five) minutes as needed.        . potassium chloride SA (K-DUR,KLOR-CON) 20 MEQ tablet Take 20 mEq by mouth daily.        . rosuvastatin (CRESTOR) 20 MG tablet Take 20 mg by mouth daily.        . saw palmetto 500 MG capsule Take 500 mg by mouth daily.        . valsartan (DIOVAN) 80 MG tablet Take 80 mg by mouth daily.        .Marland Kitchenwarfarin (COUMADIN) 5 MG tablet Take by mouth as directed.         No current facility-administered medications on file prior to visit.    Allergies  Allergen Reactions  . Sulfonamide Derivatives    Physical Exam   ATRIAL FIBRILLATION Stable.  Continue current medications and INR  monitoring [facility].   SYSTOLIC HEART FAILURE, CHRONIC He is actually high functioning.  Continue current meds. He is undergoing management of ongoing issues with his ICD ---Dr.s KMaryruth Bun and TLovena Le  HYPERTENSION Well controlled.  Continue current meds.  DASH diet handout given and discussed today.

## 2010-04-04 NOTE — Assessment & Plan Note (Signed)
He is actually high functioning.  Continue current meds. He is undergoing management of ongoing issues with his ICD ---Dr.s Maryruth Bun, and Lovena Le.

## 2010-04-04 NOTE — Assessment & Plan Note (Signed)
Stable.  Continue current medications and INR monitoring through [place] [facility].

## 2010-04-04 NOTE — Progress Notes (Signed)
HPI: 70 y/o WM here to establish care.  Formerly a pt of [facility] [place], Dr. Burnett Harry. Also established with [facility] cardiology and [facility]. Denies any acute complaints.  Although his EF is low--about 20%--he is active doing yardwork and walking his dog and doesn't feel limited by SOB.  No CP, no palpitations, no dizziness. Currently he is considering getting another ICD procedure done b/c of a loose lead. Asks if he can get Tdap vaccine today.   Past Medical History  Diagnosis Date  . Atrial fibrillation   . Cardiomyopathy   . Hypertension   . Hyperlipidemia   . Allergy     rhinitis  . Obstructive sleep apnea   . CAD (coronary artery disease)   . Ventricular tachycardia   . Adenomatous colon polyp     Colonoscopy 01/15/2008---Dr. Watt Climes, [facility]  . Kidney stones   . Myocardial infarction   . Microscopic hematuria     Past Surgical History  Procedure Date  . Icd     Guidant Contak [id number]- Virl Axe, MD 07/25/06 (second device)    Family History  Problem Relation Age of Onset  . Heart disease Mother   . Cancer Maternal Grandmother     breast  . Coronary artery disease Other     family hx of  . Hyperlipidemia Other     family hx of  . Heart attack Father     4 stents/ pacemaker  . Cancer Father     prostate  . Hypertension Sister   . Goiter Paternal Grandmother   . Nephrolithiasis Son     History   Social History  . Marital Status: Married    Spouse Name: N/A    Number of Children: N/A  . Years of Education: N/A   Occupational History  . Not on file.   Social History Main Topics  . Smoking status: Former Smoker    Types: Cigarettes    Quit date: 01/15/1980  . Smokeless tobacco: Never Used  . Alcohol Use: Yes     socially  . Drug Use: No  . Sexually Active: Not on file   Other Topics Concern  . Not on file   Social History Narrative  . No narrative on file    Current Outpatient Prescriptions on File Prior to Visit  Medication  Sig Dispense Refill  . amiodarone (PACERONE) 200 MG tablet 1 and 1/2 218m tabs po qd      . carvedilol (COREG) 25 MG tablet Take by mouth. 1 and 1/2 tabs po bid       . Coral Calcium-Magnesium-Vit D 250-125-100 MG-MG-UNIT CAPS Take 1 capsule by mouth daily.        . Flaxseed, Linseed, (FLAX SEED OIL) 1000 MG CAPS Take 1 capsule by mouth 2 (two) times daily.        . fluticasone (VERAMYST) 27.5 MCG/SPRAY nasal spray 2 sprays by Nasal route daily.        . furosemide (LASIX) 40 MG tablet Take 40 mg by mouth 2 (two) times daily.        .Marland Kitchenlevocetirizine (XYZAL) 5 MG tablet Take 5 mg by mouth every evening.        . mexiletine (MEXITIL) 150 MG capsule Take by mouth See admin instructions. 2 tabs bid      . nitroGLYCERIN (NITROSTAT) 0.4 MG SL tablet Place 0.4 mg under the tongue every 5 (five) minutes as needed.        . potassium chloride  SA (K-DUR,KLOR-CON) 20 MEQ tablet Take 20 mEq by mouth daily.        . rosuvastatin (CRESTOR) 20 MG tablet Take 20 mg by mouth daily.        . saw palmetto 500 MG capsule Take 500 mg by mouth daily.        . valsartan (DIOVAN) 80 MG tablet Take 80 mg by mouth daily.        [name] warfarin (COUMADIN) 5 MG tablet Take by mouth as directed.         No current facility-administered medications on file prior to visit.    Allergies  Allergen Reactions  . Sulfa Antibiotics Rash    ROS Review of Systems  Constitutional: Negative for fever and malaise/fatigue.  HENT: Negative for sore throat.   Eyes: Negative for blurred vision and pain.  Respiratory: Negative for cough and wheezing.   Cardiovascular: Negative for chest pain and palpitations.  Gastrointestinal: Negative for heartburn, nausea, abdominal pain and blood in stool.  Genitourinary: Negative for dysuria, urgency and hematuria.  Musculoskeletal: Negative for myalgias and back pain.  Skin: Negative for rash.  Neurological: Negative for dizziness, tremors and headaches.  Endo/Heme/Allergies: Does not  bruise/bleed easily.  Psychiatric/Behavioral: Negative for depression.     PE BP 114/76  Pulse 65  Temp(Src) 97.8 F (36.6 C) (Oral)  Ht _0  (1.778 m)  Wt 240 lb 6.4 oz (109.045 kg)  BMI 34.49 kg/m2  SpO2 98%  General Appearance:    Alert, cooperative, no distress, appears stated age  Head:    Normocephalic, without obvious abnormality, atraumatic  Eyes:    PERRL, conjunctiva/corneas clear, EOM's intact, fundi    benign, both eyes       Ears:    Normal TM's and external ear canals, both ears  Nose:   Nares normal, septum midline, mucosa normal, no drainage   or sinus tenderness  Throat:   Lips, mucosa, and tongue normal; teeth and gums normal  Neck:   Supple, symmetrical, trachea midline, no adenopathy;       thyroid:  No enlargement/tenderness/nodules; no carotid   bruit or JVD  Back:     Symmetric, no curvature, ROM normal, no CVA tenderness  Lungs:     Clear to auscultation bilaterally, respirations unlabored  Chest wall:    No tenderness or deformity     Abdomen:     Soft, non-tender, bowel sounds active all four quadrants,    no masses, no organomegaly        Extremities:   Extremities normal, atraumatic, no cyanosis or edema  CV: Irreg irreg rhythm, rate about 70, no murmur.  S1 and S2 distant.  No rub or gallop.               ATRIAL FIBRILLATION Stable.  Continue current medications and INR monitoring through [place] coumadin [facility].   SYSTOLIC HEART FAILURE, CHRONIC He is actually high functioning.  Continue current meds. He is undergoing management of ongoing issues with his ICD ---Dr.s Maryruth Bun, and Lovena Le.  HYPERTENSION Well controlled.  Continue current meds.  DASH diet handout given and discussed today.   Need for Tdap: gave Tdap today.  F/u in 6 months.

## 2010-04-04 NOTE — Telephone Encounter (Signed)
Please request records from Dr. Watt Climes (GI), and from Dr. Burnett Harry at [facility].  Thx.

## 2010-04-04 NOTE — Assessment & Plan Note (Signed)
Well controlled.  Continue current meds.  DASH diet handout given and discussed today.

## 2010-04-04 NOTE — Patient Instructions (Signed)
F/u in 6 months.

## 2010-04-05 ENCOUNTER — Encounter: Payer: Self-pay | Admitting: Family Medicine

## 2010-04-05 ENCOUNTER — Ambulatory Visit: Payer: Self-pay | Admitting: Family Medicine

## 2010-04-05 DIAGNOSIS — I2589 Other forms of chronic ischemic heart disease: Secondary | ICD-10-CM

## 2010-04-05 DIAGNOSIS — G4733 Obstructive sleep apnea (adult) (pediatric): Secondary | ICD-10-CM

## 2010-04-11 ENCOUNTER — Other Ambulatory Visit (INDEPENDENT_AMBULATORY_CARE_PROVIDER_SITE_OTHER): Payer: Medicare Other | Admitting: *Deleted

## 2010-04-11 ENCOUNTER — Ambulatory Visit (INDEPENDENT_AMBULATORY_CARE_PROVIDER_SITE_OTHER): Payer: Medicare Other | Admitting: *Deleted

## 2010-04-11 DIAGNOSIS — I2510 Atherosclerotic heart disease of native coronary artery without angina pectoris: Secondary | ICD-10-CM

## 2010-04-11 DIAGNOSIS — I4891 Unspecified atrial fibrillation: Secondary | ICD-10-CM

## 2010-04-11 DIAGNOSIS — E7800 Pure hypercholesterolemia, unspecified: Secondary | ICD-10-CM

## 2010-04-11 DIAGNOSIS — Z79899 Other long term (current) drug therapy: Secondary | ICD-10-CM

## 2010-04-11 DIAGNOSIS — Z7901 Long term (current) use of anticoagulants: Secondary | ICD-10-CM | POA: Insufficient documentation

## 2010-04-11 DIAGNOSIS — I2589 Other forms of chronic ischemic heart disease: Secondary | ICD-10-CM

## 2010-04-11 LAB — LIPID PANEL
HDL: 38.8 mg/dL — ABNORMAL LOW (ref >39.00)
LDL Cholesterol: 90 mg/dL (ref 0–99)
Total CHOL/HDL Ratio: 4
VLDL: 30.2 mg/dL (ref 0.0–40.0)

## 2010-04-11 LAB — BASIC METABOLIC PANEL
CO2: 28 mEq/L (ref 19–32)
Glucose, Bld: 102 mg/dL — ABNORMAL HIGH (ref 70–99)
Potassium: 4.2 mEq/L (ref 3.5–5.1)
Sodium: 140 mEq/L (ref 135–145)

## 2010-04-11 LAB — TSH: TSH: 1.33 u[IU]/mL (ref 0.35–5.50)

## 2010-04-11 LAB — HEPATIC FUNCTION PANEL: Total Bilirubin: 0.8 mg/dL (ref 0.3–1.2)

## 2010-04-11 LAB — MAGNESIUM: Magnesium: 2.2 mg/dL (ref 1.5–2.5)

## 2010-04-11 NOTE — Patient Instructions (Signed)
INR 2.3 Continue taking 1/2 tablet (2.47m) daily, except take 1 tablet (569m on Mondays, Wednesdays, and Fridays. Recheck in 4 weeks.

## 2010-04-12 ENCOUNTER — Telehealth: Payer: Self-pay | Admitting: *Deleted

## 2010-04-12 ENCOUNTER — Ambulatory Visit (INDEPENDENT_AMBULATORY_CARE_PROVIDER_SITE_OTHER): Payer: Medicare Other | Admitting: Family Medicine

## 2010-04-12 ENCOUNTER — Encounter: Payer: Self-pay | Admitting: Family Medicine

## 2010-04-12 VITALS — BP 102/67 | HR 69 | Temp 97.8°F | Ht 72.0 in | Wt 239.4 lb

## 2010-04-12 DIAGNOSIS — H9201 Otalgia, right ear: Secondary | ICD-10-CM

## 2010-04-12 DIAGNOSIS — J029 Acute pharyngitis, unspecified: Secondary | ICD-10-CM

## 2010-04-12 DIAGNOSIS — H9209 Otalgia, unspecified ear: Secondary | ICD-10-CM

## 2010-04-12 NOTE — Telephone Encounter (Signed)
Message copied by Fredia Beets on Thu Apr 12, 2010  9:20 AM ------      Message from: Kirk Ruths      Created: Wed Apr 11, 2010  2:02 PM       Malcom; Recheck LFTs in 6 weeks

## 2010-04-12 NOTE — Progress Notes (Signed)
  Subjective:    Patient ID: [name], male    DOB: 1940/05/26, 70 y.o.   MRN: [id number]  Otalgia  There is pain in the right ear. This is a new problem. The current episode started yesterday. The problem occurs hourly. The problem has been unchanged. There has been no fever. The pain is moderate. Associated symptoms include a sore throat (on right side only, esp with swallowing.  Right sided gums tender when flossing this am.). Pertinent negatives include no abdominal pain, coughing, ear discharge, headaches, neck pain, rash or rhinorrhea. Associated symptoms comments: No fatigue or malaise.[name] He has tried nothing for the symptoms. The treatment provided no relief.      Review of Systems  Constitutional: Negative for fever and fatigue.  HENT: Positive for ear pain and sore throat (on right side only, esp with swallowing.  Right sided gums tender when flossing this am.). Negative for congestion, rhinorrhea, neck pain and ear discharge.   Eyes: Negative for visual disturbance.  Respiratory: Negative for cough.   Cardiovascular: Negative for chest pain.  Gastrointestinal: Negative for nausea and abdominal pain.  Genitourinary: Negative for dysuria.  Musculoskeletal: Negative for back pain and joint swelling.  Skin: Negative for rash.  Neurological: Negative for weakness and headaches.  Hematological: Negative for adenopathy.       Objective:   Physical Exam VS: noted, all normal. Gen: Alert, well appearing, oriented x 4. HEENT: Scalp without lesions or hair loss.  Ears: EACs clear, normal epithelium.  TMs with good light reflex and landmarks bilaterally.  Eyes: no injection, icteris, swelling, or exudate.  EOMI, PERRLA. Nose: no drainage or turbinate edema/swelling.  No injection or focal lesion.  Mouth: lips without lesion/swelling.  Oral mucosa pink and moist.  Dentition intact and without obvious caries or gingival swelling.  Oropharynx without erythema, exudate, or swelling.  Neck:  supple, ROM full.  Carotids 2+ bilat, without bruit.  No lymphadenopathy, thyromegaly, or mass. Chest: RRR, soft systolic murmur, S1 and S2 distant.  NO rub or gallop.  LUNGS: CTA bilat, nonlabored resps. SKIN: no rash or lesion.    LAB: rapid strep NEGATIVE today.   Assessment & Plan:   1) Otalgia, right ear--with right sided throat pain.  I see no abnormality today and reassured pt.  He may have a viral URI early in it's course but more likely has these sx's for allergic rhinitis w/eustacian tube dysfunction. Warning signs of worsening illness discussed, including shingles rash since he has unilateral pain.  Of note, he says he HAS had the zostavax vaccine. Recommended prn use of tylenol for his pain.  Call or return for problems.

## 2010-04-12 NOTE — Telephone Encounter (Signed)
Spoke with pt, he is aware to stop crestor and verbally repeated it back to me. He will have labs repeated on may 10th.

## 2010-04-20 ENCOUNTER — Emergency Department (HOSPITAL_COMMUNITY)
Admission: EM | Admit: 2010-04-20 | Discharge: 2010-04-20 | Disposition: A | Discharge status: Home / Self Care | Payer: Medicare Other | Attending: Emergency Medicine | Admitting: Emergency Medicine

## 2010-04-20 DIAGNOSIS — Z7901 Long term (current) use of anticoagulants: Secondary | ICD-10-CM | POA: Insufficient documentation

## 2010-04-20 DIAGNOSIS — T82190A Other mechanical complication of cardiac electrode, initial encounter: Secondary | ICD-10-CM | POA: Insufficient documentation

## 2010-04-20 DIAGNOSIS — I509 Heart failure, unspecified: Secondary | ICD-10-CM | POA: Insufficient documentation

## 2010-04-20 DIAGNOSIS — I2510 Atherosclerotic heart disease of native coronary artery without angina pectoris: Secondary | ICD-10-CM | POA: Insufficient documentation

## 2010-04-20 DIAGNOSIS — Y838 Other surgical procedures as the cause of abnormal reaction of the patient, or of later complication, without mention of misadventure at the time of the procedure: Secondary | ICD-10-CM | POA: Insufficient documentation

## 2010-04-20 DIAGNOSIS — Z79899 Other long term (current) drug therapy: Secondary | ICD-10-CM | POA: Insufficient documentation

## 2010-04-20 DIAGNOSIS — I4891 Unspecified atrial fibrillation: Secondary | ICD-10-CM | POA: Insufficient documentation

## 2010-04-20 DIAGNOSIS — Z950 Presence of cardiac pacemaker: Secondary | ICD-10-CM | POA: Insufficient documentation

## 2010-05-09 ENCOUNTER — Ambulatory Visit (INDEPENDENT_AMBULATORY_CARE_PROVIDER_SITE_OTHER): Payer: Medicare Other | Admitting: *Deleted

## 2010-05-09 DIAGNOSIS — I4891 Unspecified atrial fibrillation: Secondary | ICD-10-CM

## 2010-05-09 LAB — POCT INR: INR: 2.1

## 2010-05-16 ENCOUNTER — Encounter: Payer: Self-pay | Admitting: Internal Medicine

## 2010-05-16 ENCOUNTER — Ambulatory Visit (INDEPENDENT_AMBULATORY_CARE_PROVIDER_SITE_OTHER): Payer: Medicare Other | Admitting: Internal Medicine

## 2010-05-16 DIAGNOSIS — I5022 Chronic systolic (congestive) heart failure: Secondary | ICD-10-CM

## 2010-05-16 DIAGNOSIS — I4729 Other ventricular tachycardia: Secondary | ICD-10-CM

## 2010-05-16 DIAGNOSIS — T82190A Other mechanical complication of cardiac electrode, initial encounter: Secondary | ICD-10-CM

## 2010-05-16 DIAGNOSIS — Z95810 Presence of automatic (implantable) cardiac defibrillator: Secondary | ICD-10-CM

## 2010-05-16 DIAGNOSIS — I472 Ventricular tachycardia, unspecified: Secondary | ICD-10-CM

## 2010-05-16 NOTE — Assessment & Plan Note (Signed)
No intercurrent ventricular tachycardia

## 2010-05-16 NOTE — Assessment & Plan Note (Signed)
The patient's device was interrogated.  The information was reviewed. No changes were made in the programming.

## 2010-05-16 NOTE — Assessment & Plan Note (Signed)
As noted we will follow this along. He may well need extraction of this lead and reimplantation. We have discussed the role of using the antimicrobial mesh in the event that this were undertaken given the number of pre-procedures patient has undergone

## 2010-05-16 NOTE — Assessment & Plan Note (Signed)
The patient has had progressive symptoms of exercise intolerance. It is not clear to me that this is related to his heart failure as he is on examination he believe it. It may be related to his diuretics and we will check his potassium and magnesium today as well as having decrease his furosemide to 40 mg a day.

## 2010-05-16 NOTE — Progress Notes (Signed)
HPI  [name] is a 70 y.o. male seen in followup for congestive heart failure previously implanted ICD and CRT upgrade for congestive heart failure undertaken in December. He has had subsequent minimal dislodgment of his LV lead with loss of capture in all vectors.  Chest x-ray recently demonstrated macro dislodgment. I asked Dr. Lovena Le to see him regarding lead removal and reimplantation. At that visit however [name] comments for that he was feeling really well at that time and as well as he had for some time. It was elected not to pursue lead revision given his well being.  However, over recent weeks he has noted a progressive loss of exercise tolerance. This is coincidental with up titration of his diuretic. There is some accompanying lightheadedness   Last cardiac catheterization in October 2008 showed an occluded right coronary artery with left-to-right collateralization. Ejection fraction is 20%. There was no other coronary disease noted. Last Myoview in June of 2010 showed prior inferior infarct but no ischemia. His ejection fraction was 21%previous carotid Dopplers in April 2006 showed 0-39% stenosis bilaterally. Previous abdominal ultrasound in April 2006 showed no aneurysm.. I l Past Medical History  Diagnosis Date  . Atrial fibrillation   . Cardiomyopathy   . Hypertension   . Hyperlipidemia   . Allergy     rhinitis  . Obstructive sleep apnea   . CAD (coronary artery disease)   . Ventricular tachycardia   . Adenomatous colon polyp     Colonoscopy 01/15/2008---Dr. Watt Climes, [facility]  . Kidney stones   . Myocardial infarction   . Microscopic hematuria     Past Surgical History  Procedure Date  . Icd     Guidant Contak [id number]- Virl Axe, MD 07/25/06 (second device)    Current Outpatient Prescriptions  Medication Sig Dispense Refill  . amiodarone (PACERONE) 200 MG tablet 1 and 1/2 228m tabs po qd      . Calcium Carbonate (CALTRATE 600 PO) Take 1 tablet by mouth 2 (two)  times daily.        . carvedilol (COREG) 25 MG tablet Take by mouth. 1 and 1/2 tabs po bid       . Flaxseed, Linseed, (FLAX SEED OIL) 1000 MG CAPS Take 1 capsule by mouth 2 (two) times daily.        . fluticasone (VERAMYST) 27.5 MCG/SPRAY nasal spray 2 sprays by Nasal route daily.        . furosemide (LASIX) 40 MG tablet Take 40 mg by mouth 2 (two) times daily.       .Marland Kitchenlevocetirizine (XYZAL) 5 MG tablet Take 5 mg by mouth every evening.        . Magnesium 250 MG TABS Take 1 tablet by mouth daily.        .Marland Kitchenmexiletine (MEXITIL) 150 MG capsule Take by mouth See admin instructions. 2 tabs bid      . MULTIPLE VITAMINS PO Take 1 capsule by mouth daily.        . Multiple Vitamins-Minerals (PRESERVISION AREDS 2 PO) Take 1 tablet by mouth daily.        . nitroGLYCERIN (NITROSTAT) 0.4 MG SL tablet Place 0.4 mg under the tongue every 5 (five) minutes as needed.        . potassium chloride SA (K-DUR,KLOR-CON) 20 MEQ tablet Take 20 mEq by mouth daily.        . saw palmetto 500 MG capsule Take 500 mg by mouth daily.        .[name]  valsartan (DIOVAN) 80 MG tablet Take 80 mg by mouth daily.        [name] warfarin (COUMADIN) 5 MG tablet Take by mouth as directed.        [name] DISCONTD: Coral Calcium-Magnesium-Vit D [phone] MG-MG-UNIT CAPS Take 1 capsule by mouth daily.        [name] DISCONTD: primidone (MYSOLINE) 50 MG tablet Take by mouth See admin instructions. 1 tab bid      . DISCONTD: rosuvastatin (CRESTOR) 20 MG tablet Take 20 mg by mouth daily.          Allergies  Allergen Reactions  . Sulfa Antibiotics Rash    Review of Systems negative except from HPI and PMH  Physical Exam Well developed and well nourished in no acute distress HENT normal E scleral and icterus clear Neck Supple JVP flat; carotids brisk and full Clear to ausculation Regular rate and rhythm, no murmurs gallops or rub Soft with active bowel sounds No clubbing cyanosis and edema Alert and oriented, grossly normal motor and sensory  function Skin Warm and Dry   Assessment and  Plan

## 2010-05-16 NOTE — Patient Instructions (Addendum)
Your physician recommends that you have lab work today: bmp/liver/magnesium (427.31).   Your physician recommends that you schedule a follow-up appointment in: 4 weeks.  Your physician has recommended you make the following change in your medication: decrease furosemide to 39m one tablet daily.

## 2010-05-17 LAB — BASIC METABOLIC PANEL
CO2: 27 mEq/L (ref 19–32)
Chloride: 102 mEq/L (ref 96–112)
Creatinine, Ser: 1.1 mg/dL (ref 0.4–1.5)
Glucose, Bld: 72 mg/dL (ref 70–99)

## 2010-05-17 LAB — HEPATIC FUNCTION PANEL
ALT: 82 U/L — ABNORMAL HIGH (ref 0–53)
Alkaline Phosphatase: 59 U/L (ref 39–117)
Bilirubin, Direct: 0.1 mg/dL (ref 0.0–0.3)
Total Protein: 6.3 g/dL (ref 6.0–8.3)

## 2010-05-24 ENCOUNTER — Other Ambulatory Visit: Payer: Self-pay | Admitting: *Deleted

## 2010-05-24 ENCOUNTER — Telehealth: Payer: Self-pay | Admitting: *Deleted

## 2010-05-24 ENCOUNTER — Other Ambulatory Visit: Payer: Medicare Other | Admitting: *Deleted

## 2010-05-24 ENCOUNTER — Ambulatory Visit (INDEPENDENT_AMBULATORY_CARE_PROVIDER_SITE_OTHER): Payer: Medicare Other | Admitting: *Deleted

## 2010-05-24 DIAGNOSIS — R7989 Other specified abnormal findings of blood chemistry: Secondary | ICD-10-CM

## 2010-05-24 DIAGNOSIS — B181 Chronic viral hepatitis B without delta-agent: Secondary | ICD-10-CM

## 2010-05-24 DIAGNOSIS — R945 Abnormal results of liver function studies: Secondary | ICD-10-CM

## 2010-05-24 DIAGNOSIS — Z79899 Other long term (current) drug therapy: Secondary | ICD-10-CM

## 2010-05-24 DIAGNOSIS — T462X1A Poisoning by other antidysrhythmic drugs, accidental (unintentional), initial encounter: Secondary | ICD-10-CM

## 2010-05-24 DIAGNOSIS — R17 Unspecified jaundice: Secondary | ICD-10-CM

## 2010-05-24 DIAGNOSIS — Z1159 Encounter for screening for other viral diseases: Secondary | ICD-10-CM

## 2010-05-24 DIAGNOSIS — R5383 Other fatigue: Secondary | ICD-10-CM

## 2010-05-24 DIAGNOSIS — K759 Inflammatory liver disease, unspecified: Secondary | ICD-10-CM

## 2010-05-24 DIAGNOSIS — I4891 Unspecified atrial fibrillation: Secondary | ICD-10-CM

## 2010-05-24 LAB — POCT INR: INR: 1.9

## 2010-05-24 NOTE — Telephone Encounter (Signed)
PT AWARE OF LAB RESULT  ATTEMPTED TO ORDER HEP B SURFACE ANTIGEN  ,B CORE ANTIBODY IGM AND HEP C ANTIBODY  CANNOT GET ORDER TO GO THROUGH [facility]  [facility] WILL NOT COVER  LABS WITH DX OF ABN LIVER  FUNCTION , FATIGUE, OR LONG TERM MED  PT IS SUPPOSE TO COME IN ON WED  05/30/10  FOR LABS PLEASE ADVISE IF THIS IS CORRECT LABS AND  NEED AN DX. THAT WILL BE COVER .

## 2010-05-25 ENCOUNTER — Other Ambulatory Visit: Payer: Self-pay

## 2010-05-25 MED ORDER — WARFARIN SODIUM 5 MG PO TABS: ORAL_TABLET | ORAL | Status: DC

## 2010-05-29 NOTE — Assessment & Plan Note (Signed)
[name]                            CARDIOLOGY OFFICE NOTE   ITAMAR, MCGOWAN                        MRN:          [id number]  DATE:09/16/2007                            DOB:          Apr 21, 1940    [name] is a very pleasant gentleman who has a history of coronary  artery disease, ischemic cardiomyopathy, status post ICD as well as  atrial fibrillation.  He also has a history of ventricular tachycardia.  Since I last saw him, he is doing reason well.  He denies any dyspnea on  exertion, orthopnea, PND, pedal edema, palpitations, presyncope,  syncope, or chest pain.  He does occasionally have mild dyspnea after  eating.  However, this is not related to exertion nor is associated  chest pain, nausea, vomiting, or diaphoresis.  It lasts for several  minutes and resolve spontaneously.  There is no associated productive  cough, fevers, or chills.   MEDICATIONS AT PRESENT:  1. Amiodarone 200 mg p.o. daily.  2. Calcium 600 mg p.o. b.i.d.  3. Multivitamin.  4. Aspirin 81 mg p.o. daily.  5. Mexiletine 150 mg p.o. b.i.d.  6. Avapro 75 mg p.o. daily.  7. Lasix 40 mg p.o. daily.  8. Carvedilol 18.75 mg p.o. b.i.d.  9. Coumadin as directed.  10.Xyzal.  11.Lovaza.  12.Metamucil.  13.Risperdal.   PHYSICAL EXAMINATION:  VITAL SIGNS:  Today shows a blood pressure  115/80.  His pulse is 80.  His weighs 247 pounds.  HEENT:  Normal.  NECK:  Supple.  CHEST:  Clear.  CARDIOVASCULAR:  Regular rate and rhythm.  ABDOMEN:  No tenderness.  EXTREMITIES:  No edema.   DIAGNOSES:  1. Ventricular tachycardia - the patient has had no recurrent symptoms      of near syncope.  He will continue on his mexiletine as well as his      amiodarone.  He also has an ICD in place.  2. Ischemic cardiomyopathy - the patient will continue on his ARB, and      beta-blocker as well as his diuretic.  3. History of implantable cardioverter-defibrillator.  4. Paroxysmal atrial  fibrillation - he will continue on his Coreg for      rate control as well as his amiodarone.  He will also continue on      Coumadin with goal INR 2 to 3.  5. Amiodarone therapy - we will check a chest x-ray today.  We will      also resuming his statin as his liver functions have normalized.      We will therefore check lipids, liver, BMET, and TSH in 4 weeks.  6. History of mildly elevated liver functions - he had this rechecked      recently and there were normal.  7. History of hyperlipidemia - we will plan to resume his statin.  I      will begin Crestor 10 mg p.o. daily and we will check lipids and      liver in 6 weeks adjust as indicated.  8. Hypertension - his blood pressure is adequately control.  I will      check a BMET in 1 week to follow his potassium and renal function.  9. History of atrial flutter ablation.  10.Coumadin therapy - he will continue to monitor in the [facility]madin      Clinic.  11.Mild dyspnea - this does not appear to be significantly bothersome.      We will not pursue this further.   He will see [place] back in 6 months.     Denice Bors Stanford Breed, MD, [facility]  Electronically Signed    BSC/MedQ  DD: 09/16/2007  DT: 09/17/2007  Job #: [id number]

## 2010-05-29 NOTE — Assessment & Plan Note (Signed)
[place]                         ELECTROPHYSIOLOGY OFFICE NOTE   MATISSE, SALAIS                        MRN:          [id number]  DATE:07/16/2007                            DOB:          25-Jun-1940    Mr. Holt comes in followup for ventricular tachycardia.  He has had no  clinical symptoms.  He had a long trip about [place] and really did  well.  His tremor is much improved.   The other issue has been hypertransaminasemia.  His last assessment was  July 07, 2007, where there had been a further gradual decrease in his  elevated AST from 48 to 47 and his ALT from 85 to 71.   OTHER MEDICATIONS:  1. Amiodarone 300 mg a day.  2. Coreg 37.5 mg b.i.d.  3. Mexiletine 300 b.i.d.  4. Avapro 75.  5. Furosemide 40.  6. Aspirin.   PHYSICAL EXAMINATION:  Blood pressure 129/80 with a pulse of 63.  His  lungs were clear.  Heart sounds were regular.  The abdomen was soft.  Extremities were with trace edema.  Neurological exam was grossly  normal.  The tremor as noted was much improved.   Electrocardiogram demonstrated AV pacing.   Interrogation of his Guidant ICD demonstrated a P-wave of 3.5 with an R-  wave of 5.3, the atrial impedance of 670 with impedance of 630 with a  ventricular impedance of 636, threshold of 1 volts at 0.5 in the A, 1.4  volts at 0.5 in the V, the LV lead was captured.  There was no  intercurrent ventricular tachycardia.   IMPRESSION:  1. Ventricular tachycardia - quiescent.  2. Amiodarone and mexiletine for ventricular tachycardia.  3. Hypertransaminasemia.  4. Coronary artery disease as well as component of nonischemic      cardiomyopathy.  5. Obesity.   Mr. Brame is actually doing beautifully.  Dr. Stanford Breed and I had a  discussion regarding his hypertransaminasemia.  Remembering that he is  not on statin therapy because of the above, we need to make a decision  at some point going forward maybe that we have to  consider ventricular  tachycardia ablation and getting him off the amiodarone.  In any case  for right now  what we done is we will plan to check his labs again in 6 weeks' time to  see if they finally  normalize.  I have also decreased his amiodarone from 300 to 200 mg a  day.  We will continue his mexiletine at his current dose and he will  follow up with Dr. Stanford Breed in 2 months' time.     Deboraha Sprang, MD, [facility]  Electronically Signed    SCK/MedQ  DD: 07/16/2007  DT: 07/17/2007  Job #: [id number]

## 2010-05-29 NOTE — Assessment & Plan Note (Signed)
[place]                         ELECTROPHYSIOLOGY OFFICE NOTE   CORBETT, MOULDER                        MRN:          [id number]  DATE:02/04/2007                            DOB:          [birth year]    Mr. Mader comes in following hospitalization for VT storm __________  in November.  After that November hospitalization he has been maintained  on amiodarone 400 mg b.i.d. and mexiletine 300 mg b.i.d.  He is doing  really well.  He is had no clinical recurrences.  He is lost 10 pounds.  He is having no problems with shortness of breath or chest pain.   His medications currently include:  1. Avapro at 75 mg.  2. Mexiletine as noted.  3. Amiodarone has noted.  4. Lipitor 80 mg.  5. Carvedilol 25 mg b.i.d.  6. Coumadin.  7. Niacin 1500 mg.   EXAMINATION:  His blood pressure is 106/59.  His neck veins were flat.  LUNGS:  Clear.  His heart sounds were regular.  EXTREMITIES:  Without edema.  SKIN:  Warm and dry.   Interrogation of his Guidant ICD demonstrates an R wave of 4.8 with an  impedance of 636.  He is currently in sinus rhythm.  There has been no  intercurrent ventricular tachycardia.   IMPRESSION:  1. Ventricular tachycardia with ventricular tachycardia storm in and      __________  2008.  2. Amiodarone up-titrated and mexiletine for #1.  3. Ischemic heart disease.      a.     Status post coronary artery bypass grafting.      b.     Depressed left ventricular function.  4. Obesity.  5. Obstructive sleep apnea.   Mr. Calzadilla is doing much better.  We will down-titrate his amiodarone  today from 400 mg b.i.d. to 400 mg a day.   He is to see Dr. Stanford Breed in March.  If he at that point has had no  intercurrent VT, I would like his amiodarone down-titrated to 300 mg a  day.  I will see him in May and make decisions at that point about  mexiletine.   In the interval we will continue him on Coreg 25 mg b.i.d. and his  Avapro at 75  mg.  He is continuing to work out and, hopefully, will  continue his weight loss program.     Deboraha Sprang, MD, [facility]  Electronically Signed    SCK/MedQ  DD: 02/04/2007  DT: 02/04/2007  Job #: [id number]

## 2010-05-29 NOTE — Assessment & Plan Note (Signed)
[facility]                         ELECTROPHYSIOLOGY OFFICE NOTE   [name], [name]                        MRN:          [id number]  DATE:12/04/2006                            DOB:          05-10-1940    HISTORY OF PRESENT ILLNESS:  [name] is seen following  catheterization that was done about 3-4 weeks ago for ventricular  tachycardia storm.  At that time, we increased his Amiodarone from 200-  400 mg a day and added mexiletine at 300 mg twice daily.  He has had no  symptomatic ventricular tachycardia since __________  interrogation__________ current episode.  He is having no problems with  nausea.  He has had some problems with constipation.   MEDICATIONS:  1. Avapro 75.  2. Coreg is down to 25 b.i.d.  3. Coumadin.  4. Lipitor 80.  5. Furosemide.   PHYSICAL EXAMINATION:  VITAL SIGNS:  Today, blood pressure 122/88, pulse  64.  LUNGS:  Clear.  HEART:  Sounds irregular.  EXTREMITIES:  Without edema.   STUDIES:  There was one intercurrent episode of ventricular tachycardia  due to his antitachycardia pacing.   IMPRESSION:  [name] is doing well.  We will plan to see him again in  about two months time.  My thought would be that we should consider  sooner rather than later undertaking VT ablation to try to minimize the  issues of recurrence and also to minimize the long term consequences of  entering the drug therapy.   He will see Dr. Stanford Breed in four months time.     Deboraha Sprang, MD, [facility]  Electronically Signed    SCK/MedQ  DD: 12/04/2006  DT: 12/05/2006  Job #: [id number]

## 2010-05-29 NOTE — Assessment & Plan Note (Signed)
[place]                            CARDIOLOGY OFFICE NOTE   ANSLEY, STANWOOD                        MRN:          [id number]  DATE:03/14/2008                            DOB:          Oct 10, 1940    Mr. Ghosh is a pleasant gentleman with a history of coronary artery  disease, mixed ischemic and nonischemic cardiomyopathy, history of ICD,  paroxysmal atrial fibrillation, and ventricular tachycardia.  Since I  last saw him on December 14, 2007, he is doing well.  There is no  dyspnea on exertion, orthopnea, PND, pedal edema, palpitations,  presyncope, syncope, or chest pain.   MEDICATIONS AT PRESENT:  1. Saw palmetto.  2. Multivitamin.  3. Aspirin 81 mg p.o. daily.  4. Avapro 75 mg p.o. daily.  5. Lasix 40 mg p.o. daily.  6. Caltrate.  7. Coreg 37.5 mg p.o. b.i.d.  8. Coumadin as directed.  9. Nexium.  10.Mexiletine 300 mg p.o. b.i.d.  11.Amiodarone 200 mg p.o. daily.  12.Flaxseed oil.  13.MiraLax.  14.Crestor 10 mg daily.  15.Dulcolax.  16.Xyzal.   PHYSICAL EXAMINATION:  VITAL SIGNS:  Today shows a blood pressure of  120/80 and his pulse is 60.  He weighs 261 pounds.  HEENT:  Normal.  NECK:  Supple.  CHEST:  Clear.  CARDIOVASCULAR:  Regular rate and rhythm.  ABDOMEN:  No tenderness.  EXTREMITIES:  No edema.   His electrocardiogram shows AV pacing.   DIAGNOSES:  1. Ventricular tachycardia - Mr. Urieta has had no recurrent symptoms.      He will continue on his present dose of amiodarone.  He will also      continue on his mexiletine.  Note apparently, there is an issue      with this medication not been made anymore.  We are working on      potential solutions to this.  I have asked my pharmacist to help      with this.  If indeed it is unavailable, then we will speak with      Dr. Caryl Comes concerning possible alternatives.  In the meantime, he      will continue his amiodarone.  He also has an implantable  cardioverter-defibrillator.  2. Ischemic cardiomyopathy - he will continue on his ARB, beta-      blocker, and diuretic.  We will check a BMET when I see him back in      3 months.  3. History of implantable cardioverter-defibrillator - management per      Dr. Caryl Comes.  4. Paroxysmal atrial ablation - the patient remains in an AV paced      rhythm today.  He will continue on his Coreg and amiodarone.  He      will also continue on Coumadin with goal INR of 2-3.  5. Amiodarone therapy - he had recent TSH and liver functions.  I will      check a chest x-ray today.  6. History of mildly elevated liver functions - we will plan to repeat      those in 3 months.  He  did have those recently and his SGOT was      minimally elevated.  7. History of hyperlipidemia - he will continue on his Crestor.  8. Hypertension - his blood pressure is adequately controlled on his      present medication.  9. History of atrial flutter ablation.   I will see him back in 6 months.     Denice Bors Stanford Breed, MD, [facility]  Electronically Signed    BSC/MedQ  DD: 03/14/2008  DT: 03/15/2008  Job #: [id number]

## 2010-05-29 NOTE — Assessment & Plan Note (Signed)
[place]                         ELECTROPHYSIOLOGY OFFICE NOTE   DEMARQUES, PILZ                        MRN:          [id number]  DATE:02/26/2008                            DOB:          [birth year]    Mr. Hicklin is seen in followup for ventricular tachycardia.  He has had  no recurrent symptoms.  Taking amiodarone currently once a day and  mexiletine 300 mg twice daily.  He feels Better than I have in 10  years.   OTHER MEDICATIONS:  1. Coreg 37.5.  2. Coumadin.  3. Crestor.  4. Avapro 75.   PHYSICAL EXAMINATION:  VITAL SIGNS:  His blood pressure is 150/90, his  pulse is 65, and his weight was 267, which represents a 15-pound gain in  the last 6 months.  LUNGS:  Clear.  HEART:  Sounds were regular.  EXTREMITIES:  Without edema.   Interrogation of his Guidant ICD demonstrates a P-wave of 2.5 with  impedance of 587 and the threshold of 0.6 at 0.4.  The R-wave was 4 with  impedance of 620 and threshold of 0.2 at 0.4.  There is no intercurrent  therapies.  I should note that he is atrial paced 100% of the time and  ventricularly paced with fusion 95% of the time with an AV delay of 300.   IMPRESSION:  1. Ischemic cardiomyopathy with depressed left ventricular function.  2. Ventricular tachycardia with recurrent storm on amiodarone and      mexiletine.  We are running into the issue of mexiletine      availability.  I have asked him to start looking around to see if      he can find some in [place] places.  3. Status post implantable cardioverter-defibrillator for the above.  4. Paroxysmal atrial fibrillation, holding sinus rhythm.  5. Elevated liver function test.  It is not clear whether this is      related to his statin or to his amiodarone.  There are data that I      had seen recently using statins just a couple of days a week.  It      may be worth doing this if his LFTs remain elevated, as we try to      sort out whether it is  related to his amiodarone or to his statin.   We will see him again in 12 months' time.  He will be followed remotely.  In the interim, he will be following up with Dr. Stanford Breed as previously  scheduled.     Deboraha Sprang, MD, [facility]  Electronically Signed   SCK/MedQ  DD: 02/26/2008  DT: 02/27/2008  Job #: [id number]

## 2010-05-29 NOTE — Assessment & Plan Note (Signed)
[name]                            CARDIOLOGY OFFICE NOTE   [name], [name]                        MRN:          [id number]  DATE:11/06/2006                            DOB:          19-Feb-1940    [name] is a pleasant gentleman who has a history of coronary  disease, ischemic cardiomyopathy, status post ICD, and atrial  fibrillation.  I last saw him in April of this year.  Note, his most  recent catheterization was performed on May 16, 2006.  At that time, he  was found to have an occluded right coronary artery, but the distal  vessel filled via left-to-right collaterals.  There was no other  obstructive disease noted.  His ejection fraction was 20%.  Also of note  that in July of this year, the patient had his ICD changed for end of  life.  Since he was seen previously, he denies any dyspnea on exertion,  orthopnea, PND, pedal edema, palpitations, chest pain.  However, over  the past 2-3 weeks, he has had multiple episodes where he transiently  feels lightheaded like I'm going to pass out.  These resolve in 4-5  seconds by his report.  He has not had chest pain, palpitations, or  syncope with these episodes.  There is no incontinence or neurological  symptoms.  There are no other complaints including no abdominal pain,  fever or chills or productive cough.   PRESENT MEDICATIONS:  1. Amiodarone 200 mg p.o. daily.  2. Carvedilol 25 mg p.o. b.i.d.  3. Lasix 40 mg p.o. daily.  4. Coumadin as directed.  5. Aspirin 81 mg p.o. daily.  6. Flax seed oil.  7. Multivitamin.  8. Saw Palmetto.  9. Lipitor 80 mg p.o. daily.  10.Niacin 150 mg p.o. daily.  11.Calcium and vitamin D.  12.Zyzal.  13.Avapro 75 mg p.o. daily.   PHYSICAL EXAM:  Blood pressure 142/92, and his pulse is 64.  He weighs  262 pounds.  He is well-developed and somewhat obese.  He is in no acute distress.  SKIN:  Warm and dry.  He does not appear to be depressed.  HEENT:   Normal.  NECK:  Supple with no bruits.  There is no jugular venous distention and  no thyromegaly.  CHEST:  Clear.  CARDIOVASCULAR:  Regular rate and rhythm.  ABDOMEN:  Benign with no tenderness and no masses palpated.  EXTREMITIES:  No edema.   His electrocardiogram with and without magnet shows A-V pacing.   His device was interrogated today, and he has had multiple episodes of  ventricular tachycardia terminated with antitachycardia pacing (11  episodes).   DIAGNOSES:  1. New onset ventricular tachycardia - [name] is having episodes      of ventricular tachycardia to explain his presyncopal episodes.      They are terminated with anti-tachycardia pacing.  Note, his ICD      has not fired.  He also has not had chest pain or worsening heart      failure symptoms.  We will check electrolytes today.  I  did discuss      the patient with Dr. Caryl Comes.  We will plan to proceed with a cardiac      catheterization as an outpatient to exclude progression of his      coronary disease.  I think this is unlikely given that he had a      catheterization in May and that he has had no chest pain but      certainly would be worthwhile to exclude.  The risks and benefits      of this procedure have been discussed with [name], and he      agrees to proceed.  Note, he is on Coumadin for his history of      atrial fibrillation.  We will hold his Coumadin for 4 days prior to      the procedure and plan to proceed as an outpatient on November 10, 2006.  He will need to have his PT checked prior to the procedure.      Dr. Caryl Comes also recommended that we add Mexiletine to his      amiodarone.  We will begin with 150 mg p.o. b.i.d.  Dr. Caryl Comes will      be in the hospital on the 27th and evaluate the patient following      the catheterization.  I have also instructed the patient that if he      has more significant episodes or his ICD fires, then he should be      hospitalized prior to the  procedure.  2. Ischemic cardiomyopathy - He will continue on his ACE inhibitor,      adrenergic receptor binder, and diuretic.  3. History of ICD.  4. Paroxysmal atrial fibrillation - He will continue on his      amiodarone.  We will check liver functions, TSH, and chest x-ray      for his amiodarone.  Note, we are stopping his Coumadin for his      catheterization, but he will resume this afterwards.  Also note      that he was in sinus rhythm today.  5. History of atrial flutter ablation.  6. Hypertension - His blood pressure is elevated today, but we will      track this and adjust his regimen as indicated.  7. Hyperlipidemia - He will continue on his statin, and we will check      lipids and adjust as indicated.   We will have the patient return in approximately 4 weeks.  In the  meantime, he will see Dr. Caryl Comes and have his catheterization, and  hopefully the Mexiletine will keep his VT quiet.     Joel Bors Stanford Breed, MD, [facility]  Electronically Signed    BSC/MedQ  DD: 11/06/2006  DT: 11/07/2006  Job #: [id number]

## 2010-05-29 NOTE — Assessment & Plan Note (Signed)
[name]                         ELECTROPHYSIOLOGY OFFICE NOTE   [name], [name]                        MRN:          [id number]  DATE:10/08/2006                            DOB:          [birth year]    The patient comes in following ICD generator replacement.  He has atrial  fibrillation.  He is now on amiodarone.  He is feeling much better with  fewer palpitations.   He has applied for disability.  He is to get an echo at [facility]  today as part of that process.   CURRENT MEDICATIONS:  1. Amiodarone now at 200 mg a day.  2. Carvedilol 25 mg b.i.d.  3. Furosemide 40 mg.  4. Avapro 75 decreased because of blood pressure issues.   PHYSICAL EXAMINATION:  VITAL SIGNS:  Blood pressure 100/56.  LUNGS:  Clear.  HEART:  Sounds were regular.  EXTREMITIES:  1+ edema.  He does note that he had more significant edema  on a recent trip to [place].   IMPRESSION:  1. Atrial fibrillation.  2. Ischemic and nonischemic heart disease with left ventricular      dysfunction.  3. Chronic systolic heart failure.  4. Modest hypotension.  5. Obesity with recent increase in weight gain.   The patient is stable.  We will see what his echo is as part of his  disability evaluation.   We will see him again in six months' time and maybe Brian S. Stanford Breed,  MD, [facility] and I can alternate every 12 months.     Deboraha Sprang, MD, [facility]  Electronically Signed    SCK/MedQ  DD: 10/08/2006  DT: 10/08/2006  Job #: [id number]

## 2010-05-29 NOTE — Progress Notes (Signed)
[facility] OFFICE NOTE   BARRY, FAIRCLOTH                        MRN:          [id number]  DATE:05/25/2007                            DOB:          1940/12/22    [name] is seen today in followup for ventricular tachycardia in the  setting of ischemic heart disease.  He also has hypertransaminasemia and  his Lipitor and Niacin have been stopped.  His transaminases continue to  decline towards normal with his 337 going to 80-something and his 167  AST value going to 48.   I certainly will see him again when he returns from his trip to the [facility] and recheck him at that time, this is scheduled in July.   We also discussed his antiarrhythmic drugs.  He will go back on his  mexiletine at 300 mg twice daily which we had reduced because of a  sensation of a tremor.  On his return, we will plan to reduce it to 200  mg twice daily.     Deboraha Sprang, MD, [facility]  Electronically Signed    SCK/MedQ  DD: 05/25/2007  DT: 05/25/2007  Job #: [id number]

## 2010-05-29 NOTE — Op Note (Signed)
[name], [name] NO.:  [id number]   MEDICAL RECORD NO.:  [id number]          PATIENT TYPE:  OIB   LOCATION:  2899                         FACILITY:  [place]   PHYSICIAN:  Deboraha Sprang, MD, FACCDATE OF BIRTH:  1940/03/28   DATE OF PROCEDURE:  07/25/2006  DATE OF DISCHARGE:                               OPERATIVE REPORT   PREOPERATIVE DIAGNOSIS:  Previously implanted ICD, now at end of life.   POSTOPERATIVE DIAGNOSIS:  Previously implanted ICD, now at end of life.   PROCEDURE:  Explantation of a previously implanted device, pocket  revision, implantation of a new device with intraoperative  defibrillation threshold testing.   Following obtaining informed consent, the patient was brought to the  electrophysiology laboratory and placed on the fluoroscopic table in  supine position.  After routine prep and drape of the left upper chest,  lidocaine was infiltrated overlying the previous incision and carried  down to the device pocket. Using sharp dissection and electrocautery,  the pocket was opened and the device was explanted.   Interrogation of the previously implanted Medtronic [id number] atrial  lead demonstrated P-wave of 3.1 with impedance of 678 a threshold 0.8  volts at 0.5 milliseconds.   The previously implanted Guidant 0148 lead model [id number] serial number  demonstrated an R-wave of 5.6 with impedance of 664, threshold of 1  volt.  Proximal coil impedance was 650 ohms.  Distal coil impedance was  660 ohms.   With these acceptable parameters recorded, the leads were attached to a  [employer]ner ICD serial number [id number].  Through the  device, the bipolar P-wave was 2.4, the pacing impedance of 573,  threshold 0.8 at 0.5. The R-wave was 4.3 with impedance of 497,  threshold of 1 volt at 0.5.  High voltage impedance was 37 ohms.  At  this point, the pocket was revised to allow for the implantation of a  large device.   Hemostasis was assured.  The leads and pulse generator  were placed in the pocket.   Ventricular fibrillation was induced via the T-wave shock.  After a  total duration of 6 seconds, a 14 joules shock was delivered through a  measured resistance of 37 ohms failing to terminate ventricular  fibrillation.  After a total duration of 13.5 seconds, a 21 joules shock  was delivered through a measured resistance of 37 ohms terminating  ventricular fibrillation and restoring an AV paced rhythm.   Please note that this ICD is a CRT device with the LV port plugged.   With these acceptable parameters recorded, the system was implanted.  The leads and pulse generator were placed in the pocket and secured to  the prepectoral fascia.  The wound was closed in three layers in the  normal fashion.  The  wound was washed, dried and a Benzoin Steri-Strip dressing was applied.  Needle counts, sponge counts, and instrument counts were correct at the  end of the procedure according to the staff.  The patient tolerated the  procedure without apparent complications.      Remo Lipps  C. Caryl Comes, MD, [facility]  Electronically Signed     SCK/MEDQ  D:  07/25/2006  T:  07/26/2006  Job:  [id number]

## 2010-05-29 NOTE — Discharge Summary (Signed)
NAMEHAWKE, VILLALPANDO NO.:  [id number]   MEDICAL RECORD NO.:  [id number]          PATIENT TYPE:  INP   LOCATION:  2013                         FACILITY:  [facility]   PHYSICIAN:  Deboraha Sprang, MD, FACCDATE OF BIRTH:  1940/06/05   DATE OF ADMISSION:  05/16/2006  DATE OF DISCHARGE:  05/20/2006                               DISCHARGE SUMMARY   PRIMARY CARDIOLOGIST:  Dr. Virl Axe.   PRIMARY CARE PHYSICIAN:  There is none listed.   PROCEDURES PERFORMED DURING HOSPITALIZATION:  1. Cardiac catheterization per Dr. Sherren Mocha on May 16, 2006.      a.     Stable, single-vessel coronary artery disease with chronic       occlusion of the right coronary artery.  Severe left ventricular       systolic dysfunction.  Left ventricular ejection fraction less       than 20%.      b.     Atrial flutter with rapid ventricular rate.  Cardioversion       with 1 biphasic synchronized shock of 150 jewels, the patient       stayed in normal sinus rhythm for only a few seconds and then       converted to atrial fibrillation.  Ventricular rate with atrial       fibrillation was well as hemodynamics were much improved and we       elected not to perform any further shocks.  The patient will be       transferred to TCU and monitored and Tikosyn load started.   DISCHARGE DIAGNOSES:  1. Severe left ventricular systolic dysfunction with an ejection      fraction of less than 20%.  2. Single-vessel coronary artery disease.  3. Ventricular tachycardia.  4. Atrial fibrillation.  5. Status post biventricular pacemaker placed May 2.  6. Status post Tikosyn loading with continuation of therapy.   HISTORY OF PRESENT ILLNESS:  This is a 70 year old male with a history  of complex arrhythmias, followed by Dr. Caryl Comes, with evidence of wide  complex tachycardia.  The patient was admitted for cardiac  catheterization and for evaluation of progression of coronary artery  disease and need for  Tikosyn load after catheterization.  The patient  had cardiac catheterization as described above on May 16, 2006 by Dr.  Sherren Mocha with the above-mentioned results.  Postcatheterization,  the patient was given Tikosyn load per protocol and followed.  The  patient did have a cardioversion, which was unsuccessful on May 16, 2006  per Dr. Caryl Comes as described above.  The patient remained in ICU for  approximately 2 days postprocedure with a history of A-fib and V-tach.  The patient continued with atrial arrhythmias and was also found to be  mildly hypokalemic.  This was repleted during hospitalization.  The  patient continued this Tikosyn loading over hospitalization and remained  in normal sinus rhythm.  The patient was maintained on Keppra throughout  hospitalization and Coumadin was reinstituted prior to discharge  __________.   Cardiac catheterization revealed no change from prior cath  in June of  2006.  The patient remained stable postcatheterization and there was no  evidence of __________.  The patient was moved to step down May 19, 2006  __________.  Potassium had normalized with the addition __________.  __________ the patient __________ after discharge.   The patient, by day of discharge, __________ by Dr. Virl Axe,  stable.  __________.  ___________ evaluation ___________ by Dr. Caryl Comes  was discharged.   MEDICATIONS:  __________ ____________ __________ hospitalization.   LABS ON DISCHARGE:  Hemoglobin 13.6, hematocrit 39.8, white blood cell  6.9, platelets 215.  PT 20.8, INR of 2.5, PTT __________.  Sodium 138,  potassium 3.8, chloride 107, CO2 28, glucose 93, BUN 12, creatinine  0.79.   VITAL SIGNS:  Temperature 97.3.  Pulse 62.  Respirations 18.  Blood  pressure 117/71.  O2 saturation 96% on room air.  The patient weighed  113.1 kilograms.   DISCHARGE MEDICATIONS:  1. Coreg 18.75 mg b.i.d.  2. Lasix 40 mg daily.  3. Aspirin 81 mg daily.  4. Lipitor 80 mg daily.   5. Niaspan 1500 mg daily.  6. Multivitamin once a day.  7. ___________  8. __________ twice a day.  9. Avapro 150 mg __________.  __________.   FOLLOWUP PLANS & APPOINTMENT:  1. Patient to follow up at the [facility] Clinic on May 8 at 9:15 a.m.      for evaluation of PT/INR and adjustment of Coumadin as necessary.  2. The patient is to follow up at the Pacemaker Clinic on May 28 at 9      a.m. for device interrogation post-hospitalization.  3. Patient to follow up with Dr. Virl Axe on June 3 at 4:15 for      post-hospitalization evaluation and Tikosyn.  4. The patient is to follow up with primary care physician, Dr.      _________, for continued medical management.   Time spent with the patient, to include physician time, 1 hour.      Phill Myron. Purcell Nails, NP      Deboraha Sprang, MD, [facility]  Electronically Signed    KML/MEDQ  D:  05/20/2006  T:  05/20/2006  Job:  [id number]

## 2010-05-29 NOTE — Assessment & Plan Note (Signed)
[facility]                            CARDIOLOGY OFFICE NOTE   [name], [name]                        MRN:          [id number]  DATE:03/19/2007                            DOB:          [birth year]    [name] is a very pleasant 35-year gentleman who has history of  coronary disease, ischemic cardiomyopathy, status post ICD and atrial  fibrillation.  When I saw him in October, he was having episodes of  ventricular tachycardia causing near syncopal episodes.  We did schedule  him to have a repeat catheterization which was performed on November 10, 2006.  His ejection fraction was 20%.  The right coronary artery was  occluded and there were left-to-right collaterals but there was  otherwise nonobstructive disease.  His amiodarone was subsequently  increased and mexiletine was added.  Since then he has done well.  There  is no dyspnea, chest pain, palpitations and there is no more episodes of  presyncope or syncope.  His ICD has not fired.   MEDICATIONS:  1. Amiodarone 200 mg p.o. b.i.d.  2. Calcium 600 mg p.o. b.i.d. supplement.  3. Multivitamin.  4. Aspirin 81 mg daily.  5. Mexiletine 150 mg tablets two p.o. b.i.d.  6. Avapro 75 mg p.o. daily.  7. Lasix 40 mg p.o. daily.  8. Lipitor 80 mg daily.  9. Carvedilol 25 mg p.o. b.i.d.  10.Coumadin as directed.  11.Xyzal.  12.Niacin 1500 mg daily.  13.Lovaza.  14.Metamucil.  15.Resveratrol.   PHYSICAL EXAMINATION:  VITAL SIGNS:  Today blood pressure of 151/95 and  pulse is 61, weight is 249 pounds.  HEENT:  Normal.  NECK:  Supple.  CHEST:  Clear.  CARDIOVASCULAR:  Regular rate and rhythm  ABDOMEN:  No tenderness.  EXTREMITIES:  No edema.   His electrocardiogram shows AV pacing.   DIAGNOSES:  1. Ventricular tachycardia - the patient has markedly improved with      the addition of mexiletine and the higher dose of amiodarone.  I      have reviewed Dr. Olin Pia last note and we will  decrease his      amiodarone to 300 mg daily at his suggestion.  I will also check      liver functions, TSH and chest x-ray given his amiodarone use.      When he sees Dr. Caryl Comes back, he may need to decrease this further      to 200 mg daily.  2. Ischemic cardiomyopathy - he will continue on his ACE inhibitor,      beta blockers and diuretic.  3. History of implantable cardioverter defibrillator.  4. Paroxysmal atrial fibrillation - he is AV paced today.  He will      continue on his amiodarone.  He will also continue on his Coumadin      with a goal INR of 2-3 which is being monitored in our Coumadin      Clinic.  5. History of atrial flutter ablation.  6. Hypertension - his blood pressure is elevated today but he states  that it typically runs in the 90 range at home.  He will track this      and we can increase his ARB as indicated.  7. Hyperlipidemia - he will continue on his Statin and Niaspan as well      as his fish oil.  We will check lipids and liver and adjust as      indicated.  8. Amiodarone use - again we are checking surveillance laboratories as      well as his chest x-ray.  9. Coumadin therapy.   He will see me back in six months.     Denice Bors Stanford Breed, MD, [facility]  Electronically Signed    BSC/MedQ  DD: 03/19/2007  DT: 03/19/2007  Job #: [id number]

## 2010-05-29 NOTE — Discharge Summary (Signed)
NAMEPERRIS, [name] NO.:  [id number]   MEDICAL RECORD NO.:  [id number]          PATIENT TYPE:  OIB   LOCATION:  2899                         FACILITY:  [facility]   PHYSICIAN:  Sueanne Margarita, PA   DATE OF BIRTH:  1940-06-20   DATE OF ADMISSION:  07/25/2006  DATE OF DISCHARGE:  07/25/2006                               DISCHARGE SUMMARY   ALLERGY:  To SULFA.   DICTATION AND EXAM:  Greater than 30 minutes.   FINAL DIAGNOSIS:  Cardioverter defibrillator as at end-of-life.   SECONDARY DIAGNOSES:  1. Ischemic cardiomyopathy, ejection fraction 20% at catheterization      May 16, 2006.  2. History of myocardial infarction 1996 right coronary artery was      chronically occluded.  3. Atrial fibrillation.  The patient failed Tikosyn therapy now on      amiodarone and in sinus rhythm.  4. Status post implantable cardioverter-defibrillator for ischemic      cardiomyopathy/ventricular tachycardia May 2002.  5. Status post atrial flutter ablation May 21, 2000.   PROCEDURE:  July 25, 2006:  Explantation of existing cardioverter-  defibrillator with implantation of Guidant Contak Renewal III model H-  170 cardioverter defibrillator.  This device is being placed into a dual  lead system that the patient already has, but it has capability for a  left ventricular cardiac resynchronization.  The patient had successful  defibrillator threshold study.   BRIEF HISTORY:  Mr. Lenoir is a 70 year old male.  He has ischemic  cardiomyopathy, a low ejection fraction of about 20% as determined by  catheterization on May 16, 2006.  He has New York Heart Association class  II congestive heart failure symptoms; and his cardioverter defibrillator  implanted in 2002 is now at end of life.  The patient says that he walks  at will.  He has had trouble with atrial fibrillation contributing to  congestive heart failure and fatigue.  He failed Tikosyn therapy which  was attempted in on May or June  of this year; and is now having success  with amiodarone which has been loaded.  The patient is on 200 mg a day.  He is in sinus rhythm at the time of his presentation on July 11.   He discussed devices with Dr. Caryl Comes.  Dr. Caryl Comes decided that the  patient, might in the future, benefit from cardiac resynchronization;  and so the generator device he is implanting will have that capability.  The patient's INR on presentation was 2.80.  He was given 1 mg IV  vitamin K and allowed to stabilize for 6 hours.  He then underwent  successful device change out going home the same day, July 11.   DISCHARGE MEDICATIONS:  He goes home on the following medications:  1. Amiodarone 200 mg daily.  2. Furosemide 40 mg daily.  3. Enteric-coated aspirin 81 mg daily.  4. Lipitor 80 mg daily at bedtime.  5. Avapro 150 mg daily.  6. Coreg 18.75 mg twice daily.  7. Niacin 500 mg daily.  8. Digoxin 0.125 mg tablets 1/2 tablet daily.  9. Xyzal 5 mg at bedtime.  10.He also takes Citracal and multivitamin daily.  11.Coumadin, he will discharge on 5 mg daily.   He is to take the bandage off on Saturday July 12 and leave the incision  open to the air.  He is to keep his incision dry for next 5 days; to  sponge bathe until Thursday July 17.  He has an office visit at the  Coumadin Clinic Tuesday, July 15, at 2:30.  He sees Dr. Caryl Comes Tuesday,  July 29, at noon.      Sueanne Margarita, PA     GM/MEDQ  D:  07/25/2006  T:  07/27/2006  Job:  [id number]   cc:   Deboraha Sprang, MD, [place]

## 2010-05-29 NOTE — Assessment & Plan Note (Signed)
[place]                         ELECTROPHYSIOLOGY OFFICE NOTE   [name], [name]                        MRN:          [id number]  DATE:08/12/2006                            DOB:          April 21, 1940    [name] is seen following ICD generator replacement.  He also has a  history of atrial fibrillation, for which he was taking Tikosyn, which  we discontinued, and put him on amiodarone at the time of his  hospitalization.  His device was replaced with a CRT device, although  the LV PORT WAS PLUGGED.   He is feeling pretty well.  He has had some problems where he has been  tired and clammy.   MEDICATIONS:  His medication list is legion and includes:  1. Amiodarone 400 a day.  2. Carvedilol 37.5.  3. Digoxin 0.0625.  4. Furosemide.  5. Coumadin.  6. Avapro 150.   EXAMINATION:  His blood pressure was 92/56, his pulse was 59.  LUNGS:  Clear.  HEART SOUNDS:  Regular.  EXTREMITIES:  Without edema.   IMPRESSION:  1. Atrial fibrillation.  2. Ischemic and non-ischemic heart disease with progressive LV      dysfunction.  3. Chronic systolic heart failure.  4. Obesity.  5. Orthostatic hypotension.  6. Ventricular tachycardia.   [name] is doing pretty well at this point.  We will plan to decrease  his Coreg today, as well as discontinue his Lanoxin, based on recent  data from [facility] and [facility] regarding increased mortality.   The second thing is we will need to assess his left ventricular  function, although his heart rate is controlled.  He is to see Dr.  Stanford [name] in October.  I will see him in about six weeks' time and, if  he continues to have adequate rate control and no further arrhythmias,  we will plan an echo prior to his seeing Dr. Stanford [name] in October.     Joel Sprang, MD, [facility]  Electronically Signed    SCK/MedQ  DD: 08/12/2006  DT: 08/13/2006  Job #: [id number]   cc:   [name]. Joel Breed, MD, [facility]

## 2010-05-29 NOTE — Cardiovascular Report (Signed)
NAME:  [name], [name] NO.:  [id number]   MEDICAL RECORD NO.:  [id number]          PATIENT TYPE:  OIB   LOCATION:  1965                         FACILITY:  [facility]   PHYSICIAN:  Minus Breeding, MD, FACCDATE OF BIRTH:  May 14, 1940   DATE OF PROCEDURE:  11/10/2006  DATE OF DISCHARGE:                            CARDIAC CATHETERIZATION   PRIMARY CARE PHYSICIAN:  Dr. Jacqulyn Cane.   PROCEDURE:  Left heart catheterization/coronary arteriography.   INDICATIONS:  Evaluate patient with ischemic cardiomyopathy, known  coronary disease and recent decompensation with ventricular tachycardia.   PROCEDURE NOTE:  Left heart catheterization was performed via the right  femoral artery. The artery was cannulated using anterior wall puncture.  A #4-French arterial sheath was inserted via the modified Seldinger  technique.  Preformed Judkins and pigtail catheter were utilized.  The  patient tolerated the procedure well and left the lab in stable  condition.   RESULTS:   HEMODYNAMICS:  LV 125/18, AO 118/90.   CORONARIES:  The left main was normal.  The LAD had proximal luminal  irregularities.  There mid and distal vessel was normal.  The first  diagonal was large and normal.  The circumflex in the AV groove was  normal.  There was a mid obtuse marginal which was large and normal.  The right coronary artery was a large dominant vessel.  It was occluded  in the proximal segment.  There was left-to-right collateralization. The  PDA was moderate size and normal.   LEFT VENTRICULOGRAM:  A left ventriculogram obtained in the RAO  projection.  The EF was 20% with global hypokinesis and inferior  akinesis.   CONCLUSION:  Severe single-vessel coronary artery disease, severe  ischemic cardiomyopathy.   PLAN:  The patient will continue to have medical management and  reevaluation for his ventricular tachycardia by Dr. Caryl Comes.      Minus Breeding, MD, [facility]  Electronically  Signed    JH/MEDQ  D:  11/10/2006  T:  11/10/2006  Job:  [id number]   cc:   Zella Richer. Burnett Harry, M.D.

## 2010-05-29 NOTE — Assessment & Plan Note (Signed)
[name]                            CARDIOLOGY OFFICE NOTE   [name], [name]                        MRN:          [id number]  DATE:12/14/2007                            DOB:          20-May-1940    [name] is a pleasant gentleman who has a history of coronary  disease, mixed ischemic and nonischemic cardiomyopathy, history of ICD  as well as paroxysmal atrial fibrillation, and ventricular tachycardia.  Since I last saw him, he is doing reasonably well.  He does not have  dyspnea on exertion, orthopnea, PND, pedal edema, palpitations,  presyncope, syncope, or chest pain.  However, over the past 2 weeks he  has had an URI that is being treated and he now feels much better.  He  did state that during that time he felt a twinge in his chest for 1-2  seconds.  It is otherwise felt well.  Note, his last catheterization in  October 2008 showed an occluded right coronary artery with left-to-right  collateralization, but there was no other significant coronary disease  noted.  The ejection fraction was 20%.   His medications at present include:  1. Saw palmetto.  2. Multivitamin.  3. Aspirin 81 mg p.o. daily.  4. Avapro 75 mg p.o. daily.  5. Lasix 40 mg p.o. daily.  6. Caltrate.  7. Coreg 37.5 mg p.o. b.i.d.  8. Coumadin as directed.  9. Nexium 40 mg p.o. daily.  10.Mexiletine 300 mg p.o. b.i.d.  11.Amiodarone 200 mg p.o. daily.  12.Flaxseed oil.  13.MiraLax.  14.Crestor 10 mg p.o. daily.   PHYSICAL EXAMINATION:  VITAL SIGNS:  Today shows a blood pressure of  120/81 and his pulse is 62.  He weighs 253 pounds.  HEENT:  Normal.  NECK:  Supple.  CHEST:  Clear.  CARDIOVASCULAR:  Regular rate.  ABDOMEN:  No tenderness.  EXTREMITIES:  No edema.   His diagnoses include:  1. Ventricular cardia - [name] is having no recurrent symptoms of      presyncope or palpitations.  He will continue on his present dose      of amiodarone as well as  mexiletine.  He also has an implantable      cardioverter-defibrillator.  2. Ischemic cardiomyopathy - he will continue on his ARB, beta-      blocker, and diuretic.  3. History of implantable cardioverter-defibrillator - he will follow      up with Dr. Caryl Comes concerning this issue.  4. Paroxysmal atrial fibrillation - he will continue on Coreg as well      as his amiodarone.  He will also continue on Coumadin, goal INR of      2-3.  5. Amiodarone therapy - when I see him back in 6 months we will need      to repeat his chest x-ray, liver functions, and TSH.  6. History of mildly elevated liver functions - he is scheduled to      have those repeated.  7. History of hyperlipemia - he will continue on his statin.  8. Hypertension - his blood  pressure is adequately controlled on his      present medications.  9. History of atrial flutter ablation.  10.Coumadin therapy.  11.Recent upper respiratory illness.   We will see him back in March as scheduled.     Denice Bors Stanford Breed, MD, [facility]  Electronically Signed    BSC/MedQ  DD: 12/14/2007  DT: 12/15/2007  Job #: [id number]

## 2010-05-29 NOTE — Op Note (Signed)
NAME:  YANIV, LAGE NO.:  [id number]   MEDICAL RECORD NO.:  [id number]          PATIENT TYPE:  OIB   LOCATION:  2916                         FACILITY:  [facility]   PHYSICIAN:  Juanda Bond. Burt Knack, MD  DATE OF BIRTH:  April 12, 1940   DATE OF PROCEDURE:  05/16/2006  DATE OF DISCHARGE:                               OPERATIVE REPORT   PROCEDURE:  Cardioversion.   INDICATIONS:  Mr. Danis presented for a diagnostic catheterization and  was found to be in a wide-complex tachycardia.  Device interrogation  demonstrated atrial flutter with rapid ventricular response of 140 beats  per minute.  The case was reviewed with Dr. Caryl Comes and we elected to  proceed with cardioversion after is diagnostic catheterization.  Anesthesia came and sedated the patient.   The patient was cardioverted with one biphasic synchronize shock of 150  joules.  He stayed in normal sinus rhythm for only a few seconds and  then converted to atrial fibrillation.  His ventricular rate with atrial  fibrillation as well as his hemodynamics were much improved and we  elected not to perform any further shocks.  The patient will be  transferred to the TCU for further monitoring and a Tikosyn load.      Juanda Bond. Burt Knack, MD  Electronically Signed     MDC/MEDQ  D:  05/16/2006  T:  05/16/2006  Job:  [id number]   cc:   Deboraha Sprang, MD, [facility]

## 2010-05-29 NOTE — Assessment & Plan Note (Signed)
[place]                         ELECTROPHYSIOLOGY OFFICE NOTE   ROMAINE, NEVILLE                        MRN:          [id number]  DATE:05/12/2006                            DOB:          Jun 05, 1940    Mr. Schirmer comes in today because his latitude remote monitoring of his  Guidant device demonstrated a flurry of tachy therapies in the last week  or so.   Upon questioning, the patient has been feeling a little bit under the  weather with a little bit less energy than normal.  Life has been  exceedingly stressful as he runs a temporary personnel agency and in the  context of the recession, things have not been going well.   His last catheterization was about 2 years ago, when he also presented  with a tachycardia that from surface was difficult to tell whether it  was ventricular tachycardia or aberration of his atrial fibrillation  with a left bundle.  It was ultimately my conclusion that it was  ventricular tachycardia.  It was elected to consider Tikosyn therapy as  he has had problems with amiodarone potentially (corneal deposits,  question anything else).  This has also been true given his atrial  fibrillation.  When we saw him last because of the increasing frequency  of atrial fibrillation, we had talked about Tikosyn as well.   Interestingly, all these episodes of what I think is again ventricular  tachycardia has been associated with a mild change in the electrogram  and regularization of the RR intervals, occurs in the context of rapid  atrial fibrillation.   To some degree, then, I think the atrial fibrillation is contributing to  the substrate modification allowing the emergence of his VT.   His medications currently include:  1. Furosemide 40 mg.  2. Coreg 37.5 mg.  3. Xyzal 5 mg h.s.  4. Niacin 150 mg.  5. Diovan 80 mg.  6. Lipitor 80 mg.  7. Aspirin.   He is allergic to SULFA.   PHYSICAL EXAMINATION:  VITAL SIGNS:  On  examination today, his blood  pressure is 112/74 with a pulse of 76.  LUNGS:  Clear.  CARDIAC:  Heart sounds were regular.  EXTREMITIES:  Without edema.   Interrogation of his defibrillator was as noted above.   IMPRESSION:  1. Coronary artery disease.      a.     Status post coronary artery bypass graft.      b.     Depressed left ventricular function.      c.     Last catheterization 2006 with a Myoview scan in October       2007 demonstrating no significant ischemia.  2. Atrial fibrillation, frequent.  3. Ventricular tachycardia, recurrent, occurring in the setting of #2.  4. Stress.   Mr. Blades is having ventricular tachycardia in the setting of ischemic  heart disease.  We will plan to bring him into the hospital and  catheterize him.  I have reviewed the potential benefits and risks of  this.  He is agreeable and willing to proceed.  In addition, we will begin him on Tikosyn therapy.  We have again  reviewed the potential benefits as well as potential risks.  His BMET  and magnesium will be checked today.   We anticipate him being discharged Monday.     Deboraha Sprang, MD, [facility]  Electronically Signed    SCK/MedQ  DD: 05/12/2006  DT: 05/12/2006  Job #: [id number]

## 2010-05-29 NOTE — Cardiovascular Report (Signed)
NAME:  [name], [name] NO.:  [id number]   MEDICAL RECORD NO.:  [id number]          PATIENT TYPE:  OIB   LOCATION:  2807                         FACILITY:  [facility]   PHYSICIAN:  Juanda Bond. Burt Knack, MD  DATE OF BIRTH:  [birth year]   DATE OF PROCEDURE:  05/16/2006  DATE OF DISCHARGE:                            CARDIAC CATHETERIZATION   PROCEDURE:  1. Left heart catheterization.  2. Selective coronary angiography.  3. Left ventricular angiography and external DC cardioversion.   INDICATION:  [name] is a 70 year old gentleman with complex  arrhythmia.  He is followed by Dr. Caryl Comes.  He has had a lot of wide  complex tachycardia, and it has been unclear whether that represents  atrial fibrillation with aberrancy versus ventricular tachycardia.  He  has coronary artery disease and a severe cardiomyopathy.  He presents  for a Tikosyn load and is here for a catheterization prior to his  Tikosyn load.   Risks and indications of the procedure were explained to the patient.  Informed consent was obtained.  At the beginning of the procedure, the  patient was found to be in a wide complex tachycardia with a heart rate  of 140 beats per minute.  Wilford Grist from [facility] came in to interrogate  the patient's device as we were uncertain whether this represented  ventricular tachycardia or an atrial dysrhythmia with aberrancy.  The  device interrogation demonstrated atrial flutter with 2/1 block.  We  attempted to pace [name] out of the rhythm with rapid atrial pacing,  but this was unsuccessful.  At that point, we reviewed the situation  with Dr. Caryl Comes and since [name] was not tolerating his rhythm well  due to hypotension, we decided to perform the diagnostic catheterization  followed by elective cardioversion.  This was all discussed in detail  with the patient as well as his wife.  Informed consent for the  cardioversion was obtained from [name] wife because he  had already  been premedicated for the catheterization.  The right groin was prepped  and draped under normal sterile conditions.  The 6 French sheath was  placed using the modified Seldinger technique.  Views of the left and  right coronary arteries were taken with standard Judkins catheters and  angled pigtail catheter was inserted into the left ventricle, and  pressures were recorded.  A left ventriculogram was done.  A pull back  across the aortic valve was performed.  All catheter exchanges were  performed over guidewire.  The patient tolerated the procedure well  without any immediate complications.   Following the catheterization, the patient was cardioverted.  Please see  the separate report for those details.   FINDINGS:  Aortic pressure is 75/60 with mean of 66.  Left ventricular  pressure 82/25.   Left main stem has moderate luminal irregularities.  It trifurcates into  the LAD intermediate branch and left circumflex.   The LAD has a 20-25% ostial stenosis.  It is a large caliber vessel that  courses down to the LV apex.  There is a very  large first diagonal  branch.  There is no significant angiographic disease throughout the  remainder of the LAD or its diagonal branch.   The ramus intermedius is fairly small caliber.  There is no significant  angiographic disease.   The left circumflex is large caliber and gives off a large first OM  branch that bifurcates into twin vessels.  The true A-V groove  circumflex is relatively small.  It collateralizes with the distal right  coronary artery via left-to-right collaterals.   The right coronary artery is occluded in its midportion.  It is diseased  in the proximal portion.  Again, the distal vessel fills from left-to-  right collaterals.   Left ventriculography performed in the 30-degree RAO projection  demonstrates severe segmental LV dysfunction with an LVEF less than 20%.  There is a large area of inferior akinesis as  well as severe anterior  hypokinesis.   ASSESSMENT:  1. Stable single-vessel coronary artery disease with chronic occlusion      of the right coronary artery.  2. Severe left ventricular systolic dysfunction.  3. Atrial flutter with rapid ventricular rate.   As detailed above, the diagnostic catheterization was performed.  This  was followed by cardioversion.  Unfortunately, the patient did not stay  in sinus rhythm for more than a few seconds, and he is now in atrial  fibrillation.  His ventricular rate is slower, and he is tolerating this  rhythm much better.  Please see the separate cardioversion report for  details.  We will resume heparin in 6 hours and plan on starting his  Tikosyn as soon as his potassium and magnesium are back.      Juanda Bond. Burt Knack, MD  Electronically Signed     MDC/MEDQ  D:  05/16/2006  T:  05/16/2006  Job:  [id number]   cc:   Deboraha Sprang, MD, [facility]

## 2010-05-29 NOTE — Assessment & Plan Note (Signed)
[facility]                         ELECTROPHYSIOLOGY OFFICE NOTE   [name], [name]                        MRN:          [id number]  DATE:06/11/2006                            DOB:          1940-02-12    [name] was seen in the clinic today on the 28th of May 2008, post  hospital discharge on the 6th of May for cardioversion and Tikosyn  loading.   On interrogation of his device today, his battery voltage is 2.52, which  is about 10% less at middle life 2, with a charge time of 15.1 seconds.  P waves measured 4.4 mV with an atrial lead impedance of 633 ohms.  R  waves measured 5.8 mV with a ventricular lead impedance of 581 ohms.  Shock impedance was 39.  There were 19 nonsustained episodes and 1 VT  episode treated with ATP therapy since discharge and 290 mode switches  noted totaling 45% of the time.  No changes were made in his parameters  today.  He was instructed on elective replacement tones and they were  demonstrated today.  He had blood work drawn for his potassium and  magnesium levels and he is going to follow up on June 3 with Dr. Caryl Comes.      Alma Friendly, LPN  Electronically Signed      Deboraha Sprang, MD, [facility]  Electronically Signed   PO/MedQ  DD: 06/11/2006  DT: 06/11/2006  Job #: [id number]

## 2010-05-29 NOTE — Letter (Signed)
June 17, 2006    Denice Bors. Stanford Breed, MD, [place] [place], [place] [address]   RE:  [name], [name]  MRN:  [id number]  /  DOB:  December 19, 1940   Dear Aaron Edelman:   Mr. Cofer comes in today.  He was recently cathed, demonstrating no  progression of his obstructive disease but deterioration of his left  ventricular ejection fraction now down to 20-25%.  He was started on  Tikosyn which allowed for cardioversion; however, he has had significant  reversion to atrial fibrillation (see below).   He also brings papers in for disability.  He tells me now that he has  been unable to work because when he goes to work he becomes exceedingly  fatigued, he gets diaphoretic, short of breath and needs to lie down.  He is very, very debilitated, and this has been progressive over recent  months.   MEDICATIONS:  His medications are reviewed:  1. Notable for his Coreg dose being 18.75 b.i.d., having been 37.5      b.i.d. when I last saw him, and I wonder whether this is a mistake.  2. Tikosyn 500 b.i.d.  3. Avapro 150.  4. Potassium for his Tikosyn.  5. Lipitor 80.  6. Aspirin.  7. Coumadin.  8. Lasix.   ALLERGIES:  SULFA.   PHYSICAL EXAMINATION:  VITAL SIGNS:  His blood pressure was about  115/74, and that is what it has been at home.  He brings these records  with him.  LUNGS:  Clear.  NECK:  Neck veins were 8-9 cm.  HEART:  Heart sounds were regular with a 2/6 murmur.  ABDOMEN:  Protuberant but soft.  There is no hepatomegaly.  EXTREMITIES:  Femoral pulses were 2+.  Distal pulses were intact.  There  is no clubbing, cyanosis, or edema.  NEUROLOGIC:  Grossly normal.   Interrogation of his Guidant PRIZM ICD demonstrated that he was in  atrial fibrillation about 50% of the time, but what is new and different  is that he is much more tachycardic with about 20-30% of his heart beats  faster than 90-100 beats per minute.   IMPRESSION:  1. Ischemic and non-ischemic heart  disease with progressive left      ventricular dysfunction.  2. Atrial fibrillation, non-responsive to Tikosyn with an increasingly      rapid ventricular response.  3. Progressive congestive heart failure, now class IIB to III.  4. Obesity.  5. Orthostatic hypertension.  6. Ventricular tachycardia.   Mr. Jaydee, [place], is not doing very well.  His functional capacity has  clearly significantly deteriorated.  I thought about quantitating with  CPX, but I am not sure that this is the right time to do that.   We discussed a number of issues.  The first was whether atrial  fibrillation and its rapid rate was contributing to left ventricular  dysfunction.  This needs to be proven by exclusion, and to that end we  need to get his heart rate under control.  The options are two - one is  rate control; one is rhythm control.  For right now, because of the  urgency of getting him in regular rhythm, and because he has also had  the ventricular tachycardia, I have elected to put him on amiodarone,  and he and his wife and I have discussed extensively the side effects,  as well as the potential benefits.  We will start him on 400 twice  day  for 2 weeks, and then 400 a day, and I will see him again in 4-5 weeks  to see how he is doing.   In addition, we talked about the potential role for RF catheter ablation  of his atrial fibrillation substrate.  He would like to be referred to  [facility], and I will seen a letter to Dr. Ola Spurr in regards  to this.  This would be done in parallel.  Given the fact that he is  only 52, control of his atrial fibrillation is essential, because of  this is not going to contribute to restoration of his function and his  symptoms continue to progress, he is potentially a candidate for a type  B transplant.  I did not raise this latter issue with him today.   I have:  1. Begun him on amiodarone as noted above.  2. Discontinued his Tikosyn and his  potassium.  3. Re-increased his Coreg from 18.75 to 25 b.i.d.  4. Begun him on Lanoxin 0.0625.  5. He will need pulmonary function tests and a TSH, which I could not      find in the old chart.   I will see him again in 4 weeks' time.    Sincerely,      Deboraha Sprang, MD, [facility]  Electronically Signed    SCK/MedQ  DD: 06/17/2006  DT: 06/18/2006  Job #: [id number]

## 2010-05-30 ENCOUNTER — Telehealth: Payer: Self-pay | Admitting: Internal Medicine

## 2010-05-30 ENCOUNTER — Other Ambulatory Visit (INDEPENDENT_AMBULATORY_CARE_PROVIDER_SITE_OTHER): Payer: Medicare Other | Admitting: *Deleted

## 2010-05-30 DIAGNOSIS — T462X1A Poisoning by other antidysrhythmic drugs, accidental (unintentional), initial encounter: Secondary | ICD-10-CM

## 2010-05-30 DIAGNOSIS — Z79899 Other long term (current) drug therapy: Secondary | ICD-10-CM

## 2010-05-30 DIAGNOSIS — R7989 Other specified abnormal findings of blood chemistry: Secondary | ICD-10-CM

## 2010-05-30 DIAGNOSIS — Z1159 Encounter for screening for other viral diseases: Secondary | ICD-10-CM

## 2010-05-30 DIAGNOSIS — Z7901 Long term (current) use of anticoagulants: Secondary | ICD-10-CM

## 2010-05-30 DIAGNOSIS — R945 Abnormal results of liver function studies: Secondary | ICD-10-CM

## 2010-05-30 DIAGNOSIS — K759 Inflammatory liver disease, unspecified: Secondary | ICD-10-CM

## 2010-05-30 NOTE — Telephone Encounter (Signed)
Addended byAlvis Lemmings on: 05/30/2010 12:54 PM   Modules accepted: Orders

## 2010-05-30 NOTE — Telephone Encounter (Signed)
Pt arrived for lab work at 11:15.  Was told it was for a test that they have to check on.  There was no order, no paperwork at front desk.Pt  Waited until 12:05 and left and told lady at front desk. Was told to try back i an hour or so.

## 2010-05-30 NOTE — Telephone Encounter (Signed)
Addended byAlvis Lemmings on: 05/30/2010 12:33 PM   Modules accepted: Orders

## 2010-05-31 LAB — HEPATITIS B CORE ANTIBODY, IGM: Hep B C IgM: NEGATIVE

## 2010-06-01 NOTE — Assessment & Plan Note (Signed)
[place]                              CARDIOLOGY OFFICE NOTE   UCHENNA, RAPPAPORT                        MRN:          [id number]  DATE:10/18/2005                            DOB:          15-Jun-1940    Mr. Higinbotham is a pleasant gentleman with ischemic cardiomyopathy, atrial  fibrillation, status post ICD.  Since I last saw him, he is doing well with  no dyspnea, chest pain, palpitations or syncope.  There is no pedal edema.   MEDICATIONS:  1. Lasix 40 mg p.o. daily.  2. Coumadin as directed.  3. Aspirin 81 mg p.o. daily.  4. Flaxseed oil.  5. Multivitamin.  6. Saw palmetto.  7. Lipitor 80 mg p.o. daily.  8. Niaspan one 1500 mg p.o. nightly.  9. Diovan 80 mg p.o. daily.  10.Coreg 50 mg p.o. b.i.d.  11.Calcium.   PHYSICAL EXAMINATION:  A blood pressure of 121/79.  His pulse was 61.  He  weighs 245 pounds.  NECK:  Supple.  CHEST:  Clear.  CARDIOVASCULAR:  Regular rate and rhythm.  EXTREMITIES:  No edema.   Electrocardiogram shows atrial pacing and a prior inferior infarct.   DIAGNOSES:  1. Ischemic cardiomyopathy.  2. Coronary artery disease.  3. History of implantable cardioverter-defibrillator.  4. History of atrial fibrillation.  5. History of atrial flutter ablation.  6. History of corneal deposits with amiodarone.  7. Hypertension.  8. Hyperlipidemia.   PLAN:  Mr. Smick is doing well from a symptomatic standpoint.  He did have  episodes of dizziness recently and was noted to be hypotensive in the  Coumadin Clinic.  We decreased his Diovan and these have resolved.  It has  been 3 years since his most recent Myoview, and we will plan to proceed with  a stress Myoview for risk stratification.  At that time, he will have a CMET  drawn to check his liver and his renal function.  Note, his most recent labs  showed a total cholesterol of 113 with an LDL of 71 and an HDL of 25.  We  discussed the importance of diet and exercise.   Also note, he is not in  atrial fibrillation today.  I think that he is relatively asymptomatic with  his PAF, and I, therefore, feel that we should continue with rate control  and anticoagulation, and would not be in favor of an anti-arrhythmic.  I  will discuss this with Dr. Caryl Comes.  He will see [place] back in 6 months.            ______________________________  Denice Bors. Stanford Breed, MD, [facility]     BSC/MedQ  DD:  10/18/2005  DT:  10/21/2005  Job #:  [id number]

## 2010-06-01 NOTE — Discharge Summary (Signed)
[place]  Patient:    [name], [name]                        MRN: [id number] Adm. Date:  [id number] Disc. Date: 06/10/00 Attending:  Nikki Dom Dictator:   Forest Becker, N.P. CC:         Allegra Lai. Terrence Dupont, M.D.  Nikki Dom, M.D., [facility]   Discharge Summary  PRIMARY DIAGNOSES:  1. Ventricular tachycardia.  2. Atrial fibrillation.  3. Atrial flutter.  HISTORY OF PRESENT ILLNESS:  This is a 70 year old patient of Dr. Terrence Dupont who underwent a cardiac catheterization after complaint of shortness of breath on exertion and fatigue, recurrent palpitations lasting a few seconds and minutes.  The patient had an event monitor which showed multiple episodes of atrial fibrillation and atrial flutter with variable block.  He has an EF of 20% and has been taking amiodarone for PAF as well as Coumadin.  He denies loss of consciousness, dizziness, or lightheadedness.  States he first developed palpitations approximately one year ago.  HOSPITAL COURSE:  The patient was admitted and underwent a radiofrequency ablation of his atrial flutter.  Had a successful ICD implant.  VFPs were approximately 14 joules.  He was induced spontaneously from metamorphic VT. The patient tolerated the procedure well.  His insertion site was without hematoma, redness, or drainage.  DISCHARGE MEDICATIONS:  Was discharged to home on the following medications:  1. Coated aspirin 81 q.d.  2. Coreg 6.25 b.i.d.  3. Prinivil 10 q.d.  4. Digoxin 0.125 q.d.  5. Lasix 20 q.d.  6. Lipitor 20 nightly.  7. Amiodarone 200 b.i.d.  8. Imdur 30 q.d.  9. Nitroglycerin p.r.n. 10. Coumadin 5 mg q.d. 11. Antibiotics p.r.n. prior to any dental, urologic, or bowel work of the     next three months.  DISCHARGE INSTRUCTIONS:  He is not to do any heavy lifting or vigorous activity with his left arm for six to eight weeks.  He is not to raise his left arm above his shoulder for  one week and gradually raise as instructed and depicted on his discharge sheet.  Low-fat, low-cholesterol, low-salt diet. Keep his incision clean and dry.  No shower for one week.  He was instructed if the defibrillator fired one time and he was okay to call the office, one time and not okay call 911, twice in a row or three times in one day to call 911.  DISCHARGE FOLLOWUP:  He has an appointment scheduled to see the physician assistant at [facility] in two weeks for a wound check when Dr. Caryl Comes was present which would be June 14 at 9 a.m., and he is to have a PT/INR drawn on Friday. The patient is also to follow with Dr. Terrence Dupont. DD:  06/10/00 TD:  06/10/00 Job: [id number]

## 2010-06-01 NOTE — Telephone Encounter (Signed)
Spoke with the patient regarding problems outlined, below.   He did understand from our staff that we had been working on correcting an issue with his lab orders, but he thought it should have been done faster. I apologized for the wait time. I explained that it was our practice to fast-track lab registrations when patients arrived for scheduled appointments and to fix any issues that we discovered with lab orders, etc., as quickly as possible when they arose.  I investigated the order and found out that there was a problem with entering the lab order in EPIC that had not been resolved before his arrival.  The order problems were fixed and the nurses involved were educated on how to fix the problem in the future.  The patient returned to have his labs drawn with no further issue.

## 2010-06-01 NOTE — Cardiovascular Report (Signed)
NAMEJACARI, [name] NO.:  [id number]   MEDICAL RECORD NO.:  [id number]          PATIENT TYPE:  INP   LOCATION:  2009                         FACILITY:  [facility]   PHYSICIAN:  Junious Silk, M.D. LHCDATE OF BIRTH:  03-May-1940   DATE OF PROCEDURE:  07/05/2004  DATE OF DISCHARGE:                              CARDIAC CATHETERIZATION   PROCEDURE PERFORMED:  Left heart catheterization with coronary angiography  and left ventriculography.   INDICATIONS:  The patient is a 70 year old male with history of previous  inferior wall myocardial infarction.  He has a known chronic total occlusion  of the right coronary. He has an  ischemic cardiomyopathy and is status post  defibrillator placement.  He was admitted to the hospital three days ago  after prolonged ventricular tachycardia.  He was ultimately emerged sinus  rhythm with a defibrillator discharge.  He did have elevated troponin  levels.  He was seen and referred for cardiac catheterization to assess his  coronary anatomy and rule out ischemic etiology for his ventricular  tachycardia.   DESCRIPTION OF PROCEDURE:  A 6-French sheath was placed in the right femoral  artery.  Coronary angiography was performed via standard Judkins 6-French  catheters. Left ventriculography was performed angled pigtail catheter.  Contrast was Omnipaque.  At the conclusion of the procedure, a 6-French  AngioSeal vascular closure device was placed in the right femoral artery  with good hemostasis.  There no complications.   RESULTS:  1.  Hemodynamics:  Left ventricular pressure 122/22. Aortic pressure 130/78.      There is no aortic valve gradient.  2.  Left ventriculogram:  There is severe akinesis of the inferior wall with      dyskinesis of the inferior apical wall.  The anterior wall appears to be      moving well.  Ejection fraction is calculated at 34%. There is no mitral      regurgitation.  3.  Coronary arteriography:  1.  The left main is normal.      2.  The left anterior descending artery has a 20% stenosis in the          proximal vessel and 20% in the midvessel.  The LAD gives rise to          single large diagonal branch.      3.  Left circumflex gives rise to a normal-sized ramus intermedius and          normal-sized obtuse marginal.  There are minor luminal          irregularities in the left circumflex.      4.  Right coronary is a dominant vessel.  There is a 50% stenosis in the          ostium of the right coronary, 60% the proximal vessel followed by          100% occlusion of the proximal vessel. The distal right coronary          including the posterior descending artery and posterolateral  branches fills via left-to-right collaterals.   IMPRESSION:  1.  Severely decreased left ventricular systolic function secondary to      ischemic cardiomyopathy.  2.  One vessel coronary disease characterized by chronic total occlusion of      the right coronary with a very mild but nonobstructive disease in the      left coronaries.   PLAN:  The patient will be managed medically for his coronary disease.  Further management will be per our electrophysiologist.       MWP/MEDQ  D:  07/05/2004  T:  07/05/2004  Job:  [id number]   cc:   Zella Richer. Burnett Harry, M.D.  [address]  [place] [address]  Fax: [phone]   Kirk Ruths, M.D. [facility]   Cardiac Catheterization Lab

## 2010-06-01 NOTE — Procedures (Signed)
[place]. [facility]  Patient:    [name], [name]                        MRN: [id number] Proc. Date: 06/09/00 Adm. Date:  [id number] Attending:  Nikki Dom                           Procedure Report  Following the obtainment of informed consent and following the radiofrequency catheter ablation of the patients atrial flutter, the patient was then prepared for defibrillator implantation.  PREOPERATIVE DIAGNOSES:  Ischemic cardiomyopathy with sustained monomorphic ventricular tachycardia.  Sinus bradycardia.  POSTOPERATIVE DIAGNOSIS:  Ischemic cardiomyopathy with sustained monomorphic ventricular tachycardia.  PROCEDURE:  Implantation of a dual-chamber, rate responsive defibrillator.  DESCRIPTION OF PROCEDURE:  Following the obtainment of informed consent, the patient was then prepared for the procedure.  After routine prep and drape of the left upper chest, intravenous contrast was injected via the left antecubital vein to identify the course and the patency of the extrathoracic left subclavian vein.  This having been accomplished, an incision was made and carried down to the layer of the prepectoral fascia using electrocautery.  A pocket was formed using electrocautery. Hemostasis was obtained.  Thereafter, attention was turned to gaining access to the extrathoracic left subclavian vein, which was accomplished without difficulty and without the aspiration of air.  A 9-French tear-away introducer sheath was placed through which was passed a Guidant model 0148 dual coil defibrillator lead, serial number [id number].  Under fluoroscopic guidance, it was manipulated to the right ventricular apex, where the bipolar R-wave was 14.4 mV with a pacing impedance of 828 ohms and a pacing threshold of 0.5 msec at 0.3 volts.  A current of threshold of 0.3 mA and there was no diaphragmatic pacing at 10 volts.  With these acceptable parameters recorded, the lead  was then attached to a Ventak Prism II DR ICD with the atrial port plugged.  The model number was 1861.  The serial number is [id number].  Through the device, the bipolar R-wave was 8.6 mV with a pacing impedance of 798 and a pacing threshold of 0.2 volts at 0.5 msec.  With these acceptable parameters recorded, the leads were secured to the prepectoral fascia and the hemostatic sutures secured and the pocket copiously irrigated with antibiotic containing solution.  The lead and the pulse generator were then placed in the pocket and defibrillation threshold testing was undertaken.  Initially, a 300 msec coupled shock induced sustained monomorphic ventricular tachycardia with a cycle length of 410 msec. This was ultimately terminated with antitachycardic pacing at 320 msec.  Ventricular fibrillation was then induced via T-wave shock.  After a total duration of 8.5 second, a 14-joule shock was delivered through a measured resistance of 39 ohms terminating ventricular fibrillation and restoring sinus rhythm.  This was noted to be really quite slow both before and after shocking.  In fact, the patient ended up pacing at rates of 40 intermittently preinduction of ventricular fibrillation and tachycardia and post.  Ventricular fibrillation was then reinduced after a 5 minute wait.  After a total duration of 9 seconds, a 14-joule shock was again delivered through a measured resistance of 39 ohms terminating ventricular fibrillation and restoring sinus rhythm.  With these acceptable parameters recorded, defibrillation threshold testing was terminated.  However, because of the bradycardia, it was elected to implant an atrial lead.  Under fluoroscopic guidance,  the extrathoracic left subclavian vein was again cannulated now inferiorly and medially to the insertion site for the ventricular lead.  There was no movement of the ventricular lead.  A 9-French tear-away introducer sheath was placed through  which was passed a Medtronic 6940, 52 cm long active fixation lead, serial number [id number].  Under fluoroscopic guidance, it was manipulated to the right atrial appendage, where the bipolar P-wave was 4.8 mV with a pacing impedance of 733 and a pacing threshold immediately after deployment of 1.6 volts at 0.5 msec with a current of threshold of 0.9 mA. With these, sleeve was then attached to the Prism DR and through the defibrillator, the lead measurements were demonstrating a P-wave of 3 mV, a pacing impedance of 936 ohms, and a pacing threshold of 0.4 volts at 0.5 msec.  With these acceptable parameters recorded, this lead was also implanted.  Both leads were secured with a hemostatic suture.  The pocket was again copiously irrigated with antibiotic-containing saline solution.  The leads and the pulse generator were then placed in the pocket, secured to the prepectoral fascia, and the wound was closed in three layers in the normal fashion.  The wound was washed, dried and a benzoin/Steri-Strips dressing was then applied.  Needle counts and sponge counts and instrument counts were correct at the end of the procedure according to the staff.  The patients device was programmed as a 3-zone device at 135/175/210 with empiric antitachycardic pacing programmed in zone one and zone two.  It also should be noted that ventricular fibrillation inductions were accomplished both at least and at nominal sensitivity.  COMPLICATIONS:  None apparent. DD:  06/09/00 TD:  06/09/00 Job: [id number]

## 2010-06-01 NOTE — Consult Note (Signed)
NAMEJOSHIAH, [name] NO.:  [id number]   MEDICAL RECORD NO.:  [id number]          PATIENT TYPE:  EMS   LOCATION:  [facility]                         FACILITY:  [place]   PHYSICIAN:  Champ Mungo. Lovena Le, M.D.  DATE OF BIRTH:  1940/08/25   DATE OF CONSULTATION:  07/02/2004  DATE OF DISCHARGE:                                   CONSULTATION   REASON FOR CONSULTATION:  Evaluation of recurrent episodes of wide QRS  tachycardia resulting in ICD discharge.   HISTORY OF PRESENT ILLNESS:  The patient is a very pleasant 70 year old man  with a history of prior myocardial infarction back in November 1996.  He was  treated with angioplasty at that time.  The patient has severe LV  dysfunction with an EF 20-30%.  He has a history of atrial arrhythmias as  well as ventricular arrhythmias.  He underwent catheter ablation of atrial  flutter back in May 2002.  At that time, he had ICD implanted.  He has been  treated on Amiodarone in the past as well as Coumadin which were both  started at the time of his hospitalization in May 2002.  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  The patient has been doing quite  well.  The patient states that one day prior to admission, he ate large  amounts of BBQ and canoed and road all terrain vehicles, all of the above  being done in excess.  Just after midnight, the patient became diaphoretic  and sweaty.  He felt severe fatigue.  He had no significant chest pain.  He  was dyspneic.  He presented to the office today for a Coumadin check.  An  EKG was subsequently obtained which demonstrated wide QRS tachycardia at a  rate of 158 beats per minute.  This was a left mostly upright axis VT.  He  was treated with multiple anti-tachycardic pacing therapies for this,  however, these would result in slowing transiently of the tachycardia  without termination.  In our office  today, the patient had apparent  acceleration of his tachycardia and subsequently underwent defibrillator  shock returning to sinus rhythm.  He now has no complaints.  He denies any  medical compliance problems but does admit to dietary noncompliance.   PAST MEDICAL HISTORY:  Notable for hypertension, dyslipidemia, the rest of  which is noted in the HPI.  He is status post bilateral inguinal herniae  repair and is status post lithotripsy years ago.   ALLERGIES:  Sulfa.  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   SOCIAL HISTORY:  The patient is married and lives in [place].  He drinks  alcohol socially but denies excessive alcohol use and denies tobacco use.   FAMILY HISTORY:  Notable for a mother who is still living with coronary  artery disease in her 60s and a father who died of an MI at age 69.  He has  one sister with hypertension and two brothers who are alive and well.  REVIEW OF SYMPTOMS:  Notable for night sweats yesterday.  He denies any  fevers, chills, weight changes, or adenopathy.  He denies headache, vision  or hearing problems, photophobia.  He does have NVT.  He does have  protracted nausea, indigestion, and abdominal pain.  He denies any skin  problems.  He denies any urinary frequency, urgency, or dysuria.  He does  have generalized weakness.  He denies numbness, depression, or anxiety  problems.  He denies nausea, vomiting, diarrhea, constipation, polyuria, or  polydipsia.  He denies heat and cold intolerance.  He does admit to  generalized arthralgias.   PHYSICAL EXAMINATION:  GENERAL:  He is a pleasant, well appearing middle aged man in no distress.  VITAL SIGNS:  Blood pressure 138/59, pulse 62 and regular, respirations 20.  HEENT:  Normocephalic, atraumatic, pupils equal and round, oropharynx moist,  sclerae anicteric.  NECK:  No jugular venous distention, there is no thyromegaly.  The trachea  was midline.  CARDIOVASCULAR:  Regular rate and  rhythm with normal S1 and S2.  There were  no murmurs, gallops, and rubs appreciated.  His PMI was laterally displaced  and enlarged.  LUNGS:  Clear to auscultation bilaterally except for rales in the bases  bilaterally.  There were no wheezes or rhonchi.  There was no increased work  of breathing.  ABDOMEN:  Soft, nontender, nondistended, no organomegaly.  EXTREMITIES:  No cyanosis, clubbing, or edema.  Pulses were 2+ and  symmetric.  NEUROLOGICAL:  Alert and oriented x 2 with cranial nerves 2-12 intact,  strength 5/5 and symmetric.   LABORATORY DATA:  The EKG at baseline demonstrates sinus rhythm with QRS  duration of 120 milliseconds.  EKG as previously noted demonstrates VT at  158 beats per minute.  This was a left axis VT of indeterminate vertical  axis.   IMPRESSION:  1.  Ischemic cardiomyopathy.  2.  Sustained, hemodynamically fairly stable monomorphic ventricular      tachycardia.  3.  Hypertension.  4.  Congestive heart failure while in VT (mild).  5.  Dietary non compliance.   DISCUSSION:  I have recommended that the patient be admitted to the  hospital.  Will continue him on his lidocaine drip for now with plans to  stop this tomorrow.  One option will be to start him on Amiodarone, although  I think this is probably not the right thing at the present time based on  his history of corneal deposits on Amiodarone.  Additional anti-arrhythmic  drug therapy will be discussed with my partner, Dr. Jolyn Nap, who follows  the patient on a regular basis.  I have encouraged him to maintain a low  salt diet.  Consideration for catheter ablation will be warranted in this  patient, as well.       GWT/MEDQ  D:  07/02/2004  T:  07/02/2004  Job:  [id number]

## 2010-06-01 NOTE — Consult Note (Signed)
. [facility]  Patient:    [name], [name]                        MRN: [id number] Proc. Date: 06/03/00 Adm. Date:  [id number] Disc. Date: [id number] Attending:  Clent Demark Dictator:   Forest Becker, C.R.N.P. CC:         Allegra Lai. Terrence Dupont, M.D.                          Consultation Report  REASON FOR CONSULTATION:  Atrial fibrillation, atrial flutter, ischemic cardiomyopathy.  HISTORY OF PRESENT ILLNESS:  Thank you for your consultation of this pleasant 70 year old gentleman, who underwent a cardiac catheterization today after complaints of shortness of breath on exertion and fatigue.  He also complains of recurrent palpitations lasting a few seconds to minutes.  The patient had an event monitor which showed multiple episodes of atrial fibrillation, atrial flutter with variable block.  He has an ejection fraction of approximately 20%, has been taking amiodarone for his paroxysmal atrial fibrillation, as well as Coumadin.  He denies loss of consciousness, dizziness, lightheadedness, states he first developed palpitations about one year ago.  PAST MEDICAL HISTORY:  Positive for inferior wall MI in November of 1996, hypertension, atrial fibrillation, atrial flutter.  PAST SURGICAL HISTORY:  PTCA of his RCA, bilateral inguinal hernia repair and lithotripsy.  SOCIAL HISTORY:  He is married with 3 children.  Positive heavy alcohol use in the past but now only social.  Tobacco, quit smoking.  He was smoking two packs a day for 25 years.  FAMILY HISTORY:  Father deceased at 83 of an MI.  Mother is alive at 40.  She had an MI at the age of 50.  ALLERGIES:  SULFA.  MEDICATIONS:  1. Coumadin 5 mg daily.  2. Aspirin 81 daily.  3. Coreg 6.25 twice day.  4. Prinivil 10 daily.  5. Digoxin 0.25 daily.  6. Lasix 40 half-a-tablet daily.  7. Lipitor 20 daily.  8. Amiodarone 200 b.i.d.  9. Imdur 30 daily. 10. Nitroglycerin p.r.n.  REVIEW OF SYSTEMS:   HEENT:  No visual changes.  CARDIOVASCULAR:  Positive DOE and shortness of breath with palpitations.  No chest pain.  RESPIRATORY: Positive DOE.  GASTROINTESTINAL:  Negative nausea, vomiting, or diarrhea.  LABORATORY DATA:  Cardiac catheterization showed an EF of 20%.  Left main was patent.  LAD 20-30% proximal.  Diagonal #1, large, patent.  Ramus was patent. Left circumflex was patent.  OM-1 was patent.  RCA 100% occluded with fills via collaterals.  Sodium was 140, potassium 4.4, chloride 105, CO2 is 23, glucose 60, BUN 14, creatinine 1.0.  INR of 1.4.  Digoxin 0.2.  Hemoglobin and hematocrit 14.8 and 42.1, respectively, white count 7.2, platelets 301.  PHYSICAL EXAMINATION:  VITAL SIGNS:  Vital signs 60, 21, 28/66.  GENERAL:  This is a well-developed, 70 year old male, lying in bed in no apparent distress.  HEENT:  Sclerae is clear.  NECK:  Supple, nontender.  No JVD.  CARDIOVASCULAR:  Rate regular rhythm.  Positive S1, S2.  No S3 or murmur.  LUNGS:  Clear to auscultation bilateral.  ABDOMEN:  Soft, round, nontender.  Normoactive bowel sounds.  EXTREMITIES:  No edema.  NEUROLOGICAL:  Awake, alert, and oriented x3.  ASSESSMENT AND PLAN:  Atrial fibrillation, atrial flutter, ischemic cardiomyopathy.  Ischemic cardiomyopathy:  Will schedule for an ICD on Monday, May 27.  Atrial flutter ablation on Monday as well.  The patient is recommended for an ICD ablation related to MADIT I and possible atrial flutter ablation. DD:  06/03/00 TD:  06/04/00 Job: [id number]

## 2010-06-01 NOTE — Op Note (Signed)
[place]. [facility]  Patient:    [name], [name]                        MRN: [id number] Proc. Date: 06/09/00 Adm. Date:  [id number] Attending:  Nikki Dom CC:         Electrophysiology Lab  Allegra Lai. Terrence Dupont, M.D.  [place], Attn:  Manus Rudd   Operative Report  PREOPERATIVE DIAGNOSIS:  Atrial flutter and atrial fibrillation.  POSTOPERATIVE DIAGNOSIS:   Atrial flutter and atrial fibrillation.  PROCEDURE:  Invasive electrophysiological study, arrhythmia mapping, and radiofrequency catheter ablation.  DESCRIPTION OF PROCEDURE:  Following the obtainment of informed consent, the patient was ______ general anesthesia under the care of doctors Lorrene Reid and Dr. Roberts Gaudy.  Please see the accompanying surgical note.  Cardiac catheterization was performed following local prep and drape of the femoral area.  Following the procedure, the catheters were removed, hemostasis was obtained and the patient was prepared for the second procedure.  CATHETERS:  A 5-French quadripolar catheter was inserted via the left femoral vein to the A-V junction.  A 7-French Duodecapolar catheter was inserted via the left femoral vein to the high right atrium and subsequently moved to the tricuspid annulus into the coronary sinus.  An 7-French, 8 mm deflectable tip radiofrequency ablation catheter was inserted via the right femoral vein using a Tessa Lerner (SR0) sheath to mapping sites in the posterior septal space.  Surface leads I, aVF, and VI were monitored continuously throughout the procedure.  Following insertion of the catheters, a stimulation protocol included incremental atrial pacing.  Incremental ventricular pacing.  Single atrial extrastimuli at a paced cycle length of 800 msec.  Coronary sinus pacing.  RESULTS:  SURFACE ELECTROCARDIOGRAM:       INITIAL         FINAL RHYTHM:                           Sinus          Sinus CYCLE LENGTH:                     1184 msec     1317 msec P-R INTERVAL:                     256 msec      256 msec QRS DURATION:                     129 msec      122 msec Q-T INTERVAL:                     479 msec      424 msec P-WAVE DURATION:                  120 msec      120 msec PREEXCITATION:                    Absent         Absent BUNDLE BRANCH BLOCK:              Present - incomplete left.  A-V NODAL FUNCTION: A-H INTERVAL:                     129 msec      145 msec A-V WENCKEBACH:  550 msec preablation VA CONDUCTION:                    Dissociated postablation A-V NODAL EFFECTIVE REFRACTORY PERIOD:   At 800 msec was 480 msec postablation.  It was not assessed preablation and conduction was continuous.  HIS PURKINJE SYSTEM FUNCTION: H-V INTERVAL:                      63 msec       53 msec HIS BUNDLE DURATION:               18 msec       12 msec  There was some suggestion of a split His potential.  ACCESSORY PATHWAY FUNCTION:  No evidence of an accessory pathway was identified.  ARRHYTHMIAS INDUCED:  Atrial flutter that was typical and counterclockwise in rotation was introduced with atrial pacing from the coronary sinus.  The cycle length of the atrial flutter was 315 msec.  It terminated spontaneously. Entrainment mapping was not undertaken.  FLUOROSCOPY:  A total of 25 minutes of fluoroscopy time was utilized at 7.5 to 15 frames per second.  RADIOFREQUENCY ENERGY:  A total of 32 minutes and 10 seconds of radiofrequency energy was applied between the tricuspid annulus and the IVC isthmus at approximately 5 oclock on the tricuspid annulus.  It should be noted that the [facility] triangle was quite small.  Initially, conduction block was noted posterior and inferior to the CS os and further mapping of this area initially failed to terminate tachycardia.  Ultimately, this was the site where conduction block was accomplished.  SUMMARY/IMPRESSION: 1. Sinus bradycardia. 2.  Abnormal atrial function with sustained atrial flutter and atrial    fibrillation previously documented induced counterclockwise typical atrial    flutter.  Radiofrequency energy applied between the tricuspid annulus and    the IVC isthmus successfully across the isthmus terminated conduction    across the isthmus with bidirectional block eliminating the substrate for    the patients atrial flutter. 3. Normal A-V nodal function. 4. Abnormal His Purkinje system function with intermittent quite long H-V    intervals and what appeared to be a split His potential. 5. No accessory pathway. 6. Normal ventricular response to programmed stimulation as described above.  SUMMARY/CONCLUSION:  The results of electrophysiological testing clarified a typical atrial flutter consistent with the patients clinical arrhythmia.  R/f energy applied between the tricuspid annulus and the IVC at 5 oclock on the isthmus successfully eliminated conduction across the isthmus and eliminated thereby the substrate for the patients atrial flutter.  RECOMMENDATIONS:  The patient will be observed and maintained on Coumadin and amiodarone.  The patient does have an ______  history of atrial fibrillation and suspect that the patient is at risk for recurrent atrial fibrillation based on the patients left ventricular dysfunction.  I would consider discontinuing the amiodarone 1-2 months following the procedure.  The patient will have intracardiac electrograms for assessment of other recurrent arrhythmias. DD:  06/09/00 TD:  06/09/00 Job: [id number]

## 2010-06-01 NOTE — Assessment & Plan Note (Signed)
[place]                           ELECTROPHYSIOLOGY OFFICE NOTE   [name], [name]                        MRN:          [id number]  DATE:10/09/2005                            DOB:          06/04/1940    PROCEDURE PERFORMED:  Defibrillator note.   SUMMARY:  [name] was seen in the clinic today on October 09, 2005 per  patient walk-in for interrogation of his Guidant Two, mode number 1861,  prism 2; date of implant was Jun 09, 2000.   On interrogation of his device today his battery volts are just 2.57, which  is middle life two with a charge time of 14.6 seconds.  P waves measured 1.9  mV with an atrial lead impedance of 668 ohms, atrial capture threshold was  not done today and he is in atrial fibrillation.  R waves measured 7.9 mV  with a ventricular capture threshold of 1.2 volts and 0.5 msec, and a  ventricular lead impedance of 604 ohms.  Shock impedance was 42 ohms.  There  were six nonsustained episodes and two episodes of ventricular tachycardia  that were treated with ATP therapy.  Also there were 223 mode switch  episodes, which is also part of the reason for his check today.  Per Dr.  Gwenette Greet after a sleep study they wanted to see if the amount of atrial  fibrillation episodes has decreased sine he has been on CPAP.   I cleared his counters today and we set him up for a return office visit  with Dr. Caryl Comes in three to four months; and, at that time we can evaluate  the amount of mode switches that he had.  No changes were made in his  parameters.      ______________________________  Alma Friendly, LPN    ______________________________  Deboraha Sprang, MD, [facility]    PO/MedQ  DD:  10/09/2005  DT:  10/11/2005  Job #:  [id number]

## 2010-06-01 NOTE — Op Note (Signed)
NAMEHOLLY, PRING NO.:  [id number]   MEDICAL RECORD NO.:  [id number]          PATIENT TYPE:  INP   LOCATION:  2009                         FACILITY:  [place]   PHYSICIAN:  Deboraha Sprang, M.D.  DATE OF BIRTH:  08/15/1940   DATE OF PROCEDURE:  07/02/2004  DATE OF DISCHARGE:                                 OPERATIVE REPORT   PREOPERATIVE DIAGNOSIS:  Ventricular tachycardia with device failure to  detect, failure to terminate.   POSTOPERATIVE DIAGNOSIS:  Ventricular tachycardia with device failure to  detect, failure to terminate.   PROCEDURE:  Electrophysiology study via ICD and reprogramming of the ICD.   The patient was brought to the electrophysiology laboratory and sedated.  Ventricular tachycardia was induced repeatedly at 400, 600, 320, 310.  His  VT was reproducibly induced at a cycle length of 365 milliseconds.  A  reprogrammed ATP went from 84 to 81% and successfully terminated ventricular  tachycardia in four out of four cases.  In addition, ventricular tachycardia  with rate smoothing programmed off detected appropriately in three out of  three occasions.  The patient awakened.       SCK/MEDQ  D:  07/06/2004  T:  07/06/2004  Job:  [id number]

## 2010-06-01 NOTE — Assessment & Plan Note (Signed)
[place]                         ELECTROPHYSIOLOGY OFFICE NOTE   PAYSON, EVRARD                        MRN:          [id number]  DATE:04/22/2006                            DOB:          1940-05-26    [name] is seen.  He has atrial fibrillation and ventricular  tachycardia with appropriate ATP repeatedly delivered.  He continues to  have atrial fibrillation.  He was able to lose some weight, but he has  put some of it back on.  His dizziness is much improved since the down-  titration of his Coreg from 50 to 37.5 b.i.d.   PHYSICAL EXAMINATION:  VITAL SIGNS:  On examination today, his blood  pressure is 124/82.  Pulse is 72.  LUNGS:  Clear.  CARDIAC:  Heart sounds were regular.   Interrogation of his Guidant ICD demonstrates a P wave of 4 with  impedence of 66 and a threshold of 1 volt at 0.5.  The R wave was 5.4,  impedence of 589, threshold of 0.8 and 0.5.  Battery voltage was 2.54,  representing status MOL-II.   IMPRESSION:  1. Paroxysmal atrial fibrillation.  2. Ventricular tachycardia with appropriate intercurrent ATP therapy.  3. Ischemic heart disease with depressed left ventricular function.  4. Prior implantable cardioverter/defibrillator implantation.  5. Hypertension.  6. Obesity.   [name] is stable.  We will plan to see him again in six months time.  He will be followed by [facility] in the interim.     Deboraha Sprang, MD, [facility]  Electronically Signed    SCK/MedQ  DD: 04/22/2006  DT: 04/22/2006  Job #: [id number]

## 2010-06-01 NOTE — Assessment & Plan Note (Signed)
[place]                            CARDIOLOGY OFFICE NOTE   BRICE, KOSSMAN                        MRN:          [id number]  DATE:04/17/2006                            DOB:          09-18-1940    Mr. Guymon is a pleasant gentleman who has a history of coronary artery  disease, ischemic cardiomyopathy, status post ICD, and atrial  fibrillation. Since I last saw him he is doing extremely well. There is  no dyspnea on exertion by his report, nor is there orthopnea, PND, pedal  edema, palpitations, pre syncope, syncope, or chest pain. He has had  some issues with feeling dizzy with standing and low blood pressure  recently but Dr. Caryl Comes decreased his Coreg to 37.5 mg p.o. b.i.d. and  this has much improved. His medications include;  1. Lasix 40 mg p.o. daily.  2. Coumadin as directed.  3. Aspirin 81 mg p.o. daily.  4. Flax seed oil.  5. Multivitamins, supplemental.  6. Lipitor 80 mg p.o. daily.  7. Niacin 1500 mg p.o. daily.  8. Diovan 80 mg p.o. daily.  9. Calcium.  10.Coreg 37.5 mg p.o. b.i.d.  11.Xyzal 5 mg p.o. at bedtime.   His physical exam today shows a blood pressure of 131/82 and his pulse  is 81. He weighs 251 pounds.  NECK: Supple.  CHEST: Clear.  CARDIOVASCULAR EXAM: Shows an irregular rhythm.  EXTREMITIES: Show no edema.   DIAGNOSES:  1. Coronary artery disease.  2. Ischemic cardiomyopathy.  3. History of ICD.  4. History of paroxysmal atrial fibrillation.  5. History of atrial flutter oblation.  6. History of __________ deposits with amiodarone.  7. Hypertension.  8. Hyperlipidemia.   PLAN:  Mr. Knope is doing well from a symptomatic standpoint. He  continues to have paroxysmal atrial fibrillation and we will continue  with his Coreg for rate control as well as Coumadin with a goal line of  INR 2 to 3. He is scheduled to see Dr. Caryl Comes later this month for  discussion of Tikosyn therapy. Note he is asymptomatic and  certainly  rate controlled and anticoagulation would be an option as well. His most  recent Myoview performed on October 29, 2005 showed prior infarct with  question of slight periinfarction ischemia but was felt to be low risk  and we are managing him medically. When he returns we will  have him  have lipid and liver drawn. We will adjust his regimen as indicated. He  will continue with diet and exercise. Note he does not smoke. He will  see [place] back in 6 months.     Denice Bors Stanford Breed, MD, [facility]  Electronically Signed    BSC/MedQ  DD: 04/17/2006  DT: 04/17/2006  Job #: [id number]

## 2010-06-01 NOTE — Cardiovascular Report (Signed)
[place]. [facility]  Patient:    [name], [name]                        MRN: [id number] Proc. Date: 06/03/00 Adm. Date:  [id number] Disc. Date: [id number] Attending:  Clent Demark CC:         Champ Mungo. Lovena Le, M.D. [facility]             Cardiac Catheterization Laboratory                        Cardiac Catheterization  PROCEDURES:  Left cardiac catheterization with selective left and right coronary angiography, LV-graphy via right groin using Judkins technique.  INDICATIONS FOR PROCEDURE:  [name] is a 70 year old white male with past medical history significant for coronary artery disease, status post inferior wall MI in November 1996.  He is status post PTCA to large ectatic RCA with large amount of thrombus burden, with resulting TIMI-2 distal flow, status post ReoPro at that time.  He also has history of hypertension, history of congestive heart failure, paroxysmal recurrent atrial fibrillation/flutter. Remote history of tobacco abuse.  He complains of exertional dyspnea, feeling weak, tired, and recurrent palpitations lasting a few seconds to a minute. The patient denies chest pain, nausea, vomiting, diaphoresis, palpitations not related to exertion.  The patient had been monitored which showed multiple runs of atrial fibrillation/flutter, with variable block heart rate ranging from 50 to 150 approximate, with flutter rate of 220 to 260 approximately. The patient also underwent stress Cardiolite on April 07, 2000, and exercised for 9.19 minutes on Bruce protocol.  He complained of fatigue.  Peak heart rate achieved was 144 which was 88% of the maximum predicted heart rate.  Peak blood pressure was 170/78.  EKG showed normal sinus rhythm, old inferior wall MI, nonspecific T-wave changes at baseline.  Up to 1.5 mm ST depression was noted only in V6 at peak of exercise, which reverted to its baseline.  No atrial fibrillation/flutter or ventricular arrhythmias  were noted during exercise or in the recovery phase.  Cardiolite scan showed large inferior wall scarring, which involved part of septum, apex, and lateral wall, associated with peri-infarct ischemia with ejection fraction of 20%.  The patient was started on amiodarone and Coumadin for recurrent paroxysmal atrial fibrillation/flutter, with no significant control of atrial fibrillation/flutter.  The patient denies any lightheadedness or syncope.  He denies PND, orthopnea, or leg swelling.  I discussed with patient and his wife regarding various options of treatment, i.e. invasive medical versus invasive left catheterization and possible PCI, EP evaluation for possible flutter ablation, AV ablation, and ICD for ischemic dilated cardiomyopathy for primary prevention of sudden cardiac death and he agrees.  PAST MEDICAL HISTORY:  As above.  PAST SURGICAL HISTORY:  He had bilateral inguinal hernia repair many years ago.  He had lithotripsy in July 1996.  SOCIAL HISTORY:  He is married and has three children.  He smoked two packs per day for 25 years.  He quit approximately 25 years ago.  He used to drink seven to eight drinks per week.  He now drinks socially occasionally.  FAMILY HISTORY:  Father died of MI at the age of 69.  He had first MI at the age of 62.  Mother is alive.  She is 41 and had MI two years ago.  One sister is hypertensive.  Two brothers are in good health.  MEDICATIONS  AT HOME:  1. Coumadin 5 mg p.o. q.d. which was stopped three days ago.  2. Baby aspirin 81 mg p.o. q.d.  3. Coreg 6.25 mg p.o. b.i.d.  4. Prinivil 10 mg p.o. b.i.d.  5. Digoxin 0.25 mg p.o. q.d.  6. Lasix 40 mg 1/2 tablet p.o. q.d.  7. Lipitor 20 mg p.o. q.d.  8. Amiodarone 200 mg p.o. b.i.d.  9. Imdur 30 mg p.o. q.d. 10. Nitrostat sublingual p.r.n.  ALLERGIES:  SULFA causes rash.  PHYSICAL EXAMINATION:  GENERAL:  He is alert, awake, and oriented x 3.  No acute distress.  VITAL SIGNS:  Blood  pressure 110/76, pulse 66 and regular.  HEENT:  Conjunctivae was pink.  NECK:  Supple.  No JVD.  No bruits.  LUNGS:  Clear to auscultation, without rhonchi or rales.  CARDIOVASCULAR:  S1/S2 normal.  There was soft systolic murmur.  There was no S3 gallop.  ABDOMEN:  Soft.  Bowel sounds are present.  Nontender.  EXTREMITIES:  There is no clubbing, cyanosis, or edema.  IMPRESSION:  Exertional dyspnea, weakness, positive stress Cardiolite, rule out coronary insufficiency, ischemic dilated cardiomyopathy, coronary artery disease, status post inferior wall myocardial infarction in 1996, hypertension, hypercholesterolemia, compensated congestive heart failure, recurrent paroxysmal atrial fibrillation/flutter with variable block, morbid obesity.  PLAN:  I discussed with the patient and his wife various options of treatment, i.e. medical versus invasive, i.e. left catheterization and possible PCI and questionable AV or flutter ablation and pulmonary vein ablation and possible ICD versus flutter ablation and repeating echocardiogram in a few months to see if LV function improves and if no improvement for possible ICD for primary prevention of sudden cardiac death.  We discussed its risks, i.e. death, MI, stroke, need for emergency CABG, local vascular complications, etc., and he consented for the procedure.  PROCEDURE:  After obtaining informed consent, the patient was brought to the catheterization laboratory and was placed on the fluoroscopy table.  The right groin was prepped and draped in the usual fashion.  Two percent Xylocaine was used for local anesthesia in the right groin.  With the help of a thin-wall needle, a 6-French arterial sheath was placed.  The sheath was aspirated and flushed.  Next, a 6-French left Judkins catheter was advanced over the wire under fluoroscopic guidance up to the ascending aorta.  The wire was pulled out, the catheter aspirated, and connected to the  manifold.  The catheter was  further advanced and engaged into left coronary ostium.  Multiple views of the left system were taken.  Next, the catheter was disengaged and was pulled out over the wire and was replaced with a 6-French right Judkins catheter which was advanced over the wire under fluoroscopic guidance up to the ascending aorta.  The wire was pulled out, the catheter was aspirated, and connected to the manifold.  The catheter was further advanced and engaged into right coronary ostium.  A single-view of right coronary artery was obtained.  Next, the catheter was disengaged and was pulled out over the wire and was replaced with a 6-French pigtail catheter which was advanced over the wire under fluoroscopic guidance up to the ascending aorta.  The wire was pulled out, the catheter was aspirated, and connected to the manifold.  The catheter was further advanced across the aortic valve into the LV.  LV pressures were recorded.  Next, LV-graphy was done in the 30 degree RAO position.  Post angiographic pressures were recorded from LV and then pullback pressures were  recorded from the aorta.  There was no gradient across the aortic valve. Next, the pigtail catheter was pulled out over the wire.  Sheaths were aspirated and flushed.  FINDINGS:  LV was severely enlarged.  Ejection fraction was 20% to 25%. Inferior wall was akinetic.  Left main was patent.  The LAD has 20% to 30% proximal stenosis.  Diagonal-1 was large and patent. Ramus was medium sized and patent.  Left circumflex was patent.  OM-1 was medium sized and patent.  The RCA was 100% occluded and filled by collaterals from the left system.  The patient tolerated the procedure well.  There were no complications.  PLAN:  We will get EP consultation with Dr. Tomma Lightning. Lovena Le for possible atrial flutter ablation.  If no improvement in LV function after ablation, he may need ICD for primary prevention of sudden cardiac  death. DD:  2000/06/20 TD:  06-20-2000 Job: [id number]

## 2010-06-01 NOTE — Op Note (Signed)
. [facility]  Patient:    SEBASTIAN, LURZ Visit Number: [id number] MRN: [id number]          Service Type: EMS Location: [facility] Attending Physician:  Hardin Negus Proc. Date: 12/12/00 Admit Date:  10/03/2000 Discharge Date: 10/03/2000   CC:         Electrophysiology Laboratory  Hayden N. Terrence Dupont, M.D.   Operative Report  PREOPERATIVE DIAGNOSIS:  Ventricular tachycardia with atrial arrhythmias status post amiodarone therapy and prior ICD implantation.  POSTOPERATIVE DIAGNOSIS:  Ventricular tachycardia with atrial arrhythmias status post amiodarone therapy and prior ICD implantation.  DESCRIPTION OF PROCEDURE:  Electrophysiology study.  OPERATOR:  Nikki Dom, M.D., [facility]  INDICATIONS:  The patient is status post atrial flutter ablation.  The patient has had amiodarone initiated for ventricular tachyarrhythmias.  He has also had recurrent episodes of atrial fibrillation, which has been associated with the induction of ventricular tachycardia and a double tachycardia format associated with long/short coupling.  The patients guidant 1861 defibrillator was interrogated.  The atrial amplitude was 5.1 with an impedance of 782 and a threshold of 1 volt and 0.5 ms.  The R-wave was 14.1 with an impedance of 786 and a threshold of .8 and .5 ms.  With these successful parameters recorded, the patient was submitted for electrophysiological testing via his defibrillator.  Initially, ventricular fibrillation was induced via the T-wave shock.  After a total duration of nine seconds, a 14 joule shock was delivered through a measured resistance of 44 ohms terminating ventricular fibrillation and restoring an AV paced rhythm.  Ventricular tachycardia was then induced at at 400, 600, 320, and 300.  The tachycardia cycle length ranged from 385 to 415 ms.  This was induced on three different occasions with an expanded tachycardia pacing regimen  now equal to 8 beats.  On the first occasion, 1 ATP terminated on the second 2 ATP episodes via ventricular tachycardia and the third with the end now equal to 10, a single episode of APT therapy terminated ventricular tachycardia.  IMPRESSION: 1. Ventricular tachycardia now with improved termination secondary    to ATP reprogramming. 2. Defibrillation threshold is less than or equal to 14 joules. 3. Amiodarone therapy for atrial arrhythmias with persistent    recurrence.  There are a couple of issues that we will need to keep in mind.  In the event that the patient continues to have atrial fibrillation, we will need to consider alternative antiarrhythmia therapy and/or RF ablation therapy.  In addition, if the patient has persistent problems with ventricular tachycardia (see below) would consider RF ablation of his VT.  We will also activate his rate smoothing relatively gently because of the irregularities associated with atrial fibrillation and see if we cannot avoid the pauses that were seen prior to the various episodes of ventricular tachycardia. Attending Physician:  Lanae Crumbly B DD:  12/12/00 TD:  12/12/00 Job: [id number]

## 2010-06-01 NOTE — Assessment & Plan Note (Signed)
[name]                         ELECTROPHYSIOLOGY OFFICE NOTE   DELANTE, KARAPETYAN                        MRN:          [id number]  DATE:01/27/2006                            DOB:          12-07-1940    [name] is seen.  He is feeling much better since he underwent his  sleep study with diagnosis of sleep apnea and then treatment thereafter.  He has two major concerns.  One is what impact have we had on the  frequency of his atrial fibrillation, and the second is describing  episodes where he has been quite lightheaded and weak, takes his blood  pressure and notes it to be 80 or so systolic, decreases his Diovan for  a couple of days, and then resumes, only to have this cycle repeat in  about 3 or 4 weeks.   He denies chest pain or shortness of breath and as noted, he is much  more energetic since being put on a sleep mask.   On examination today, his blood pressure was 117/74 with a pulse of 59.  There was no orthostatic change at 5 minutes.  There was some dizziness  going from lying to sitting, not with standing, no change of vital  signs.  His lungs were clear, heart sounds were regular without murmurs,  the abdomen was soft, the extremities were without edema.   Interrogation of his defibrillator demonstrated battery voltage of 2.56  with impending ERI.   IMPRESSION:  1. Atrial fibrillation, still representing about 50% of his beats.  2. Ischemic cardiomyopathy.  3. Ventricular tachycardia status post implantable cardioverter-      defibrillator with recurrent antitachycardia pacing appropriately      delivered.  4. Obstructive sleep apnea, now on a mask, with significant      improvement.  5. Episodic hypotension requiring decreasing of his blood pressure      medications.   I have advised Mr. Dentremont to decrease his Coreg to 37.5 mg twice daily.  We will plan to see him again in about 3 months to think further about  what to do  about his atrial fibrillation and the potential role of  Tikosyn.  We anticipate that we will reach ERI prior to that.     Deboraha Sprang, MD, [facility]  Electronically Signed    SCK/MedQ  DD: 01/27/2006  DT: 01/27/2006  Job #: [id number]

## 2010-06-01 NOTE — Op Note (Signed)
NAME:  [name], [name]                           ACCOUNT NO.:  [id number]   MEDICAL RECORD NO.:  [id number]                   PATIENT TYPE:  AMB   LOCATION:  ENDO                                 FACILITY:  [facility]   PHYSICIAN:  Jeryl Columbia, M.D.                 DATE OF BIRTH:  1940-02-10   DATE OF PROCEDURE:  06/01/2003  DATE OF DISCHARGE:                                 OPERATIVE REPORT   PROCEDURE:  Colonoscopy with polypectomy.   INDICATIONS FOR PROCEDURE:  A patient with history of colon polyps due for  repeat screening. Consent was signed after risks, benefits, methods, and  options were thoroughly discussed in the office in the past.   MEDICINES USED:  Demerol 60, Versed 7.   DESCRIPTION OF PROCEDURE:  Rectal inspection was pertinent for external  hemorrhoids, small. Digital exam was negative. The video colonoscope was  inserted and easily advanced around the colon to the cecum.  It did require  rolling him on his back and some abdominal pressure, no significant  abnormality was seen on insertion.  The cecum was identified by the  appendiceal orifice and the ileocecal valve. The scope was slowly withdrawn.  The prep was adequate, there was some liquid stool that required washing and  suctioning.  On slow withdrawal through the colon, the cecum, ascending and  transverse were normal. The scope was withdrawn around the left side of the  colon. A few scattered left sided probable hyperplastic appearing polyps  were seen and were hot biopsied and put in the first container. In the  distal sigmoid and rectum, multiple tiny hyperplastic appearing polyps were  seen and a few of these were hot biopsied and put in the second container.  Anal rectal pullthrough and retroflexion confirmed some small hemorrhoids.  Not all of the hyperplastic rectal and distal sigmoid polyps seen were  sampled. The scope was reinserted a short ways up the left side of the  colon.  Not mentioned there was an  occasional left sided diverticula, air  was suctioned, scope removed.  The patient tolerated the procedure well.  There was no obvious or immediate complications.   ENDOSCOPIC DIAGNOSIS:  1. Internal and external hemorrhoids.  2. Occasional left sided diverticula.  3. Multiple probable hyperplastic appearing left sided polyps hot biopsied.  4. A few of the rectal and distal sigmoid hyperplastic appearing polyps seen     were not biopsied or sampled.  5. Otherwise within normal limits to the cecum.   PLAN:  Await pathology to determine future colonic screening. Go ahead and  start __________dose back for five days and if no problems go to a full  dose. Call me p.r.n. otherwise return care to above mentioned doctors for  the customary health care maintenance.[name]  Jeryl Columbia, M.D.    MEM/MEDQ  D:  06/01/2003  T:  06/01/2003  Job:  [id number]   cc:   Zella Richer. Burnett Harry, M.D.  [place]  [place] [address]  Fax: [phone]   Kirk Ruths, M.D. [facility]   Deboraha Sprang, M.D.

## 2010-06-01 NOTE — Discharge Summary (Signed)
[name], [name] NO.:  [id number]   MEDICAL RECORD NO.:  [id number]          PATIENT TYPE:  INP   LOCATION:  2009                         FACILITY:  [facility]   PHYSICIAN:  Kirk Ruths, M.D. LHCDATE OF BIRTH:  [birth year]   DATE OF ADMISSION:  07/02/2004  DATE OF DISCHARGE:  07/06/2004                                 DISCHARGE SUMMARY   DISCHARGE DIAGNOSES:  1.  Discharging day #1, after left heart catheterization.  This study showed      no change from catheterization done May 2002.  Ejection fraction at this      catheterization 34%.  2.  Discharged on the day of electrophysiology study using the existing      Guidant ICD.  The patient had successful reprogramming of his      cardioverter defibrillator with improved ejection and successful ETP.  3.  Admitted July 02, 2004, with diaphoresis, abdominal discomfort, and      inability to sleep.  4.  Found to have sustained monomorphic ventricular tachycardia on      presentation with ICD discharged x1.  5.  Treated with intravenous lidocaine for a 24 hour period.  6.  Inadequate ventricular tachycardia detection by Guidant ICD, now      reprogrammed July 06, 2004, with improved detection, successful ETP      demonstrated in the electrophysiology laboratory.  7.  Status post implant of Guidant cardioverter defibrillator on Jun 09, 2000.  8.  History of atrial flutter ablation on Jun 09, 2000.  9.  History of paroxysmal atrial fibrillation, on Coumadin.  10. History of atrial tachy-arrhythmias initiating ventricular tachycardia.   SECONDARY DIAGNOSES:  1.  History of ventricular myocardial infarction in November 1996, with      percutaneous transluminal coronary angioplasty of the right coronary      artery at that time.  2.  Followup left heart catheterization in May 2002.  This study showed that      the right coronary artery is completely totally occluded, ejection      fraction 20 to 25%.  There are  adequate left-to-right collaterals in      this study.  3.  Bilateral inguinal herniorrhaphies.  4.  History of lithotripsy in 1996.  5.  Implant of Guidant ICD on Jun 09, 2000.  6.  Gastroesophageal reflux disease.   PROCEDURE:  1.  On July 05, 2004, left heart catheterization, ejection fraction 34%.  No      mitral regurgitation.  The right coronary artery continues to be 100%      occluded proximally with adequate left-to-right collaterals being left      system, shows a mild tandem 20% lesion ____________the first diagonal in      the LAD, and a mid point 20% lesion in the left circumflex.  2.  On July 06, 2004, electrophysiology study.  Successful reprogramming of      the Guidant cardioverter defibrillator with improved ejection and      successful anti-tachycardia pacing algorithm established by Dr. Remo Lipps  Gretel Acre DISPOSITION:  [name] discharging after recovering from  electrophysiology study sedation effect on July 06, 2004.  Once stabilized  after initial presentation on July 02, 2004, with IV lidocaine for a 24 hour  period.  The patient has had no further ventricular dysrhythmia or ICU  therapies.  His Coumadin was allowed to drift downward.  In addition, the  patient with left heart catheterization, and this was done as dictated above  on July 05, 2004.  There is no change in the catheterization study from May  2002.  His ejection fraction has slightly improved to 34% from the afore  mentioned catheterization in May 2002, when it was 20 to 25%.  He then  subsequent to catheterization underwent electrophysiology study with  reprogramming of his cardioverter defibrillator and achieving successful  anti-tachycardia pacing.  The patient is discharged on the following  medications:   DISCHARGE MEDICATIONS:  1.  Coumadin, his home dose is 5 mg daily except for 7.5 mg on Monday,      Wednesday, and Friday.  At the time of discharge, his INR is 1.3, and  he      will be asked to take 7.5 mg on Friday and on Saturday, and then to      resume his home dose regimen.  2.  Digoxin 0.25 mg daily.  3.  Coreg 25 mg b.i.d.  4.  Aspirin 81 mg daily.  5.  Niacin 1000 mg at bedtime.  6.  Lipitor 60 mg at bedtime.  7.  Altace 5 mg b.i.d.  8.  Lasix 40 mg daily.  9.  Nitroglycerin 0.4 mg one tablet under the tongue x3 doses p.r.n. chest      pain.  10. Lovenox 110 mg subcutaneous injection q.12h., one injection Saturday      morning, one injection Saturday evening, both July 07, 2004.  11. Continue his Centrum Silver.  12. Testosterone gel.  13. Saw palmetto.   ACTIVITY:  He is asked not to engage in any lifting, driving, or sexual  activity for the next two days.   DIET:  Low sodium, low cholesterol diet.   WOUND CARE:  He may shower.  He is to call [phone] if he experiences any  bruising, drainage, or swelling at his catheterization site.   FOLLOWUP:  1.  He will present to the [facility] on Monday, July 09, 2004, at      10:30 for PT/INR.  2.  He will see Dr. Stanford Breed in followup on August 15, 2004, at 9:15.  3.  He has an office visit at the [facility] on December 10, 2004, at      10:00.   HISTORY:  [name] is a 70 year old male who has a history of inferior  myocardial infarction dating back to November 1996.  At that time, he  received PTCA of the right coronary artery.  Assessment with catheterization  in May 2002, showed ejection fraction of 20 to 25%, and 100% occlusion of  the right coronary artery.  This territory is fed with left-to-right  collaterals.  He has a history of sustained monomorphic ventricular  tachycardia and atrial arrhythmias, both atrial fibrillation and atrial  flutter.  He is status post radio frequency catheter ablation of atrial  flutter on Jun 09, 2000, with subsequent implantation of Guidant Ventak  Prizm II ER ICD at the same time.  He was then started on Amiodarone and Coumadin at the May  2002,  hospitalization.  The Coumadin has subsequently  discontinued secondary to discovery of ocular deposits (this is a universal  finding in patient's with Amiodarone, is not indication for discontinuing  the drug).   Recurrent episodes of atrial fibrillation have been associated with  induction of ventricular tachycardia, and the patient did have  electrophysiology study in November 2002.  At that time, __________was  reprogrammed and he underwent defibrillation threshold settings less than or  equal to 14 joules.   The patient had an active Father's Day on Sunday, July 01, 2004.  He canoed,  he ate a large bar-b-que dinner with increased oral intake.  Around  midnight, he started with perfuse sweating, abdominal discomfort like a  knot, had no sleep at all.  He presented to the office of [facility], was found to have sustained monomorphic ventricular tachycardia  on electrocardiogram, and he did receive one shock in the office which  brought him back to sinus rhythm.  The ICD has been interrogated, there are  no atrial arrhythmias to initiate his ventricular tachycardia at this time.  He is experiencing his anginal equivalent which is diaphoresis and abdominal  discomfort.  His device is also one that is on recall.  The patient has been  loaded with IV lidocaine in the office, and this will continue as he is  admitted through the emergency room at [place]:  The patient presented with sustained monomorphic  ventricular tachycardia which persisted for about nine hours prior to ICD  therapy at the office of [facility] in which converted the patient's  sinus rhythm.  He was admitted with IV lidocaine which was discontinued  after 24 hours.  His admission troponin-I studies are 0.15, 0.23, and 0.14.  His initial complete blood count was white cells of 9.2, hemoglobin of 14.4,  hematocrit of 42.4, and platelets 286.  Admission serum  electrolytes:  Sodium 135, potassium 3.8, chloride 105, carbonate 24, BUN 16, creatinine  0.9, glucose 99.  TSH on admission was 1.367.  On interrogation of the  device, it was determined that it was not adequately sensing ventricular  tachycardia and would have to be reprogrammed.  The patient also had what  amounts to his anginal equivalent which was diaphoresis and abdominal  digestion-like discomfort, and so his Coumadin was held and he was scheduled  for a cath lab.  At that time, his Coumadin would become subtherapeutic.  He  has had no further ventricular arrhythmias during his hospitalization.  The  patient's INR on admission was 4.2, and it was not until July 05, 2004, that  the INR was 1.6.  At that time, the patient underwent left heart  catheterization.  This study showed no changes from the catheterization of  2002, except ejection fraction had risen from 20 to 25% to 34%.  The right  coronary artery territory was still being fed by left-to-right collaterals, and there was no significant disease in the left ventricular vascular  territory.  The patient underwent an electrophysiology study with  reprogramming of device on July 06, 2004, and is discharging without  complications after recovering from this study on the same date, July 06, 2004.  He will follow up as dictated above.      Soledad Gerlach   GM/MEDQ  D:  07/06/2004  T:  07/07/2004  Job:  [id number]   cc:   Zella Richer. Burnett Harry, M.D.  [address]  [address]  Fax: [phone]

## 2010-06-01 NOTE — Assessment & Plan Note (Signed)
[name]                               PULMONARY OFFICE NOTE   COMMODORE, BELLEW                        MRN:          [id number]  DATE:07/31/2005                            DOB:          1940-08-06    REFERRING PHYSICIAN:  Dr. Virl Axe.   HISTORY OF PRESENT ILLNESS:  The patient is a 70 year old gentleman whom I  have been asked to see for obstructive and central sleep apnea.  The patient  has undergone nocturnal polysomnography on July 02, 2005, where he was found  to have a combined respiratory disturbance index of 61 events per hour  during the first half of the night.  He was ultimately placed on CPAP which  caused a significant increase in the number of central apneas.  He was  ultimately changed over the BiPAP and had a very good response at a final  pressure of 16/12 with all of his central and obstructive events being  controlled, even through REM.  The patient states that he typically goes to  bed at 11:00 p.m. and he gets up at 6:30 a.m. to start his day.  He feels  that he is rested most of the time.  His wife does note that he has loud  snoring as well as pauses in his breathing during sleep.  He denies any  gasping arousals during the night.  The patient denies that alertness is an  issue for him, yet he takes a nap for 1-1/2 hours each morning every other  day and sometimes also takes a nap in the afternoon.  His wife feels that he  will doze if he sits down to read or watch TV.  He has no difficulties  driving because of sleepiness.  Of note, his weight is up about 20 pounds  over the last 2 years.   PAST MEDICAL HISTORY:  1.  Significant for hypertension.  2.  History of ischemic cardiomyopathy.  3.  History of severe atrial fibrillation.  4.  History of dyslipidemia.  5.  Status post pacemaker defibrillator placement.  6.  History of renal calculi.   CURRENT MEDICATIONS:  1.  Furosemide 40 mg q.day.  2.  Coreg 50 mg  b.i.d.  3.  Calcium.  4.  Testosterone replacement q.day.  5.  Coumadin as directed.  6.  Aspirin 81 mg q.day.  7.  Niacin 1500 mg q.h.s.  8.  Lipitor 80 mg q.day.  9.  Diovan 80 mg b.i.d.   ALLERGIES:  1.  SULFA.  2.  CIPRO.   SOCIAL HISTORY:  He is married and has children.  He has a long history of  tobacco use but has not done so since 1977.   FAMILY HISTORY:  Remarkable only for heart disease.   REVIEW OF SYSTEMS:  As per history of present illness.  Also see patient  intake form documented in the chart.   PHYSICAL EXAMINATION:  GENERAL:  He is an overweight male in no acute  distress.  VITALS:  Blood pressure 110/70.  Pulse 66.  Temperature 97.7.  Weight 248  pounds.  Height, he is 6 feet tall.  O2 saturation on room air is 95%.  HEENT:  Pupils equal round reactive to light and accommodation.  Extraocular  muscles are intact.  Nares are somewhat narrowed.  Oropharynx does show  moderate elongation of the soft palate and uvula.  NECK:  Supple without JVD or lymphadenopathy.  There is no palpable  thyromegaly.  CHEST:  Totally clear.  CARDIAC EXAM:  Reveals regular rate and rhythm.  It is not irregular at this  time.  ABDOMEN:  Soft. Nontender.  Good bowel sounds.  GENITAL EXAM, BREAST EXAM, RECTAL EXAM:  Not done or not indicated.  LOWER EXTREMITIES:  Show minimal edema.  Pulses are intact distally.  NEUROLOGICALLY:  Alert, oriented with evidence of mental deficits.   IMPRESSION:  Obstructive and central sleep apnea, documented by nocturnal  polysomnography.  Even thought the patient feels that he is not overly  symptomatic, clearly his wife feels this is more of an issue.  He also does  quite a bit of napping during the day which can certainly decrease his  symptomatology.  The patient had a very good response to BiPAP over at the  sleep lab and is willing to try this at home.  I tried to explain to him not  only may it help his quality of life, but it may also help  him from a  cardiovascular standpoint.   PLAN:  1.  Work on weight loss.  2.  Will initiate BiPAP with an inspiratory pressure of 12 and an expiratory      pressure of 8.  His ultimate goal will be 16/12.  The patient is to      follow up in four weeks or sooner if there are problems.                                   Kathee Delton, MD, FCCP   [facility]/MedQ  DD:  08/01/2005  DT:  08/01/2005  Job #:  [id number]   cc:   Deboraha Sprang, MD, [facility]

## 2010-06-01 NOTE — Procedures (Signed)
NAME:  REGIS, HINTON NO.:  [id number]   MEDICAL RECORD NO.:  [id number]          PATIENT TYPE:  OUT   LOCATION:  SLEEP CENTER                 FACILITY:  [facility]   PHYSICIAN:  Danton Sewer, M.D. [facility] DATE OF BIRTH:  1940-12-02   DATE OF STUDY:  07/02/2005                              NOCTURNAL POLYSOMNOGRAM    REFERRING PHYSICIAN:  Dr. Virl Axe   INDICATIONS FOR STUDY:  Hypersomnia with sleep apnea.   EPWORTH SCORE:  4.   SLEEP ARCHITECTURE:  The patient had a total sleep time of 340 minutes with  decreased slow wave sleep as well as REM.  Sleep onset latency was normal  and REM onset was very prolonged at 362 minutes.  Sleep efficiency was  decreased at 83%.   RESPIRATORY DATA:  The patient underwent split night protocol where he was  found to have 138 events in the first 135 minutes of sleep.  These were all  obstructive in nature and gave him an extrapolated respiratory disturbance  index over that time period of 61 events per hour.  Events were not  positional and moderate snoring was noted.  By protocol the patient was then  placed on a medium Ultram Mirage full face mask and CPAP titration was  initiated.  As soon as CPAP pressure was initiated, the patient began to  have fairly frequent central apneas; however, he continued to have  breakthrough obstructive apneas as the pressure was increased.  The patient  began to complain of difficulty on exhalation and therefore he was changed  to a bilevel device and titration was continued.  At a final inspiratory  pressure of 16 cm and expiratory pressure of 12 cm, the patient had  excellent control of both his obstructive and central events.  He had no  breakthrough during supine REM by the end the study.   OXYGEN DATA:  There was O2 desaturation as low as 86% with the patient's  obstructive events.   CARDIAC DATA:  The patient was found to have a cardiac rhythm that was  composed of both wide and narrow  complexes and appeared to be irregular.  Classification was very difficult.  The patient has known to have a  defibrillator.   MOVEMENT/PARASOMNIA:  The patient was found to have 26 leg jerks with only 8  during the entire night resulting in arousal or awakening.   IMPRESSION/RECOMMENDATIONS:  1.  Severe obstructive sleep apnea/hypopnea syndrome with a respiratory      disturbance index of 61 events per hour during the first half of the      night and O2 desaturation as low as 86%.  By split night protocol the      patient was placed on a medium Ultram Mirage ResMed full face mask and      CPAP titration was initiated.  The patient began to have frequent      central events that did not resolve as the CPAP pressure was increased      for persistent obstructive events.  The patient was then changed to a      bilevel device because of difficulty  on exhalation and      then a final inspiratory pressure of 16 cm with expiratory pressure of      12 cm there was excellent control of all of his central and obstructive      events.           ______________________________  Danton Sewer, M.D. [facility]  Diplomate, American Board of Sleep  Medicine     KC/MEDQ  D:  07/19/2005 12:06:34  T:  07/19/2005 12:39:47  Job:  [id number]

## 2010-06-06 ENCOUNTER — Encounter: Payer: Self-pay | Admitting: Family Medicine

## 2010-06-06 NOTE — Telephone Encounter (Signed)
Records have been requested.

## 2010-06-12 ENCOUNTER — Encounter: Payer: Self-pay | Admitting: Internal Medicine

## 2010-06-14 ENCOUNTER — Other Ambulatory Visit: Payer: Self-pay | Admitting: Podiatry

## 2010-06-19 ENCOUNTER — Other Ambulatory Visit: Payer: Self-pay | Admitting: *Deleted

## 2010-06-19 ENCOUNTER — Ambulatory Visit (INDEPENDENT_AMBULATORY_CARE_PROVIDER_SITE_OTHER): Payer: Medicare Other | Admitting: Internal Medicine

## 2010-06-19 ENCOUNTER — Encounter: Payer: Self-pay | Admitting: Internal Medicine

## 2010-06-19 ENCOUNTER — Ambulatory Visit (INDEPENDENT_AMBULATORY_CARE_PROVIDER_SITE_OTHER): Payer: Medicare Other | Admitting: *Deleted

## 2010-06-19 DIAGNOSIS — Z95810 Presence of automatic (implantable) cardiac defibrillator: Secondary | ICD-10-CM

## 2010-06-19 DIAGNOSIS — I4891 Unspecified atrial fibrillation: Secondary | ICD-10-CM

## 2010-06-19 DIAGNOSIS — I4729 Other ventricular tachycardia: Secondary | ICD-10-CM

## 2010-06-19 DIAGNOSIS — I472 Ventricular tachycardia: Secondary | ICD-10-CM

## 2010-06-19 DIAGNOSIS — R7401 Elevation of levels of liver transaminase levels: Secondary | ICD-10-CM

## 2010-06-19 MED ORDER — EPLERENONE 25 MG PO TABS: 25.0000 mg | ORAL_TABLET | Freq: Every day | ORAL | Status: DC

## 2010-06-19 MED ORDER — CARVEDILOL 25 MG PO TABS: 25.0000 mg | ORAL_TABLET | Freq: Two times a day (BID) | ORAL | Status: DC

## 2010-06-19 MED ORDER — NITROGLYCERIN 0.4 MG SL SUBL: 0.4000 mg | SUBLINGUAL_TABLET | SUBLINGUAL | Status: DC | PRN

## 2010-06-19 NOTE — Assessment & Plan Note (Signed)
Patient's heart failure is stable and probably class IIb. At this point he is not inclined to pursue resynchronization. I would have a relatively low threshold however for proceeding to be very broad QRS associated with his right ventricular pacing secondary to complete heart block

## 2010-06-19 NOTE — Progress Notes (Signed)
HPI  [name] is a 70 y.o. male Seen in followup for congestive heart failure in the setting of ischemic heart disease and previously implanted CRT-D with subsequent left ventricular lead macro dislodgment. His symptoms however been quite stable. When he saw Dr. Lovena [name] concerning lead extraction and lead implantation he was doing as well as he had for some time and it was decided not to pursue it then. He has had some intercurrent worsening; however, this has stabilized nicely. He is working in the garden. He had a no significant shortness of breath. Energy level is adequate.  He also has ventricular tachycardia for which he takes amiodarone. LFTs were abnormal and his Crestor was stopped. Repeat LFTs remained elevated but somewhat improved for Last cardiac catheterization in October 2008 showed an occluded right coronary artery with left-to-right collateralization. Ejection fraction is 20%. There was no other coronary disease noted. Last Myoview in June of 2010 showed prior inferior infarct but no ischemia. His ejection fraction was 21%previous carotid Dopplers in April 2006 showed 0-39% stenosis bilaterally. Previous abdominal ultrasound in April 2006 showed no aneurysm.[name] l Past Medical History    Past Medical History  Diagnosis Date  . Atrial fibrillation     Ablation for a-flutter 05/2000  . Cardiomyopathy     Ischemic.  EF 20%  . Hypertension   . Hyperlipidemia   . Allergy     rhinitis  . Obstructive sleep apnea   . CAD (coronary artery disease)     PTCA RCA 1997 (Inf/post MI)  . Ventricular tachycardia     ICD, pacer  . Adenomatous colon polyp     Colonoscopy 07/2007---Dr. Watt [name], [name] (Rpt 5 yrs recommended)  . Kidney stones     No distal stones on CT 2010  . Myocardial infarction   . Microscopic hematuria   . Diverticulosis of colon 2005    Noted on Colonoscopies in 2005 and 2009 and on CT 2010  . Internal hemorrhoids     Colonoscopy 07/2007  . Hepatic steatosis  2010    Noted on CT abd 2010 and on u/s abd 04/2009.  . Allergic rhinitis     Past Surgical History  Procedure Date  . [name] [id number]- Joel Axe, MD 07/25/06 (second device)  . Breast surgery 1990    "Tissue removed from breast"  . Umbilical hernia repair 1791  . Hernia repair 1975    bilateral inguinal  . Extracorporeal shock wave lithotripsy   . Atrial ablation surgery     Current Outpatient Prescriptions  Medication Sig Dispense Refill  . amiodarone (PACERONE) 200 MG tablet Take 200 mg by mouth daily.       . Calcium Carbonate (CALTRATE 600 PO) Take 1 tablet by mouth 2 (two) times daily.        . carvedilol (COREG) 25 MG tablet Take by mouth. 1 and 1/2 tabs po bid       . furosemide (LASIX) 40 MG tablet Take 1 tablet (40 mg total) by mouth daily.      [name] mexiletine (MEXITIL) 150 MG capsule Take by mouth See admin instructions. 2 tabs bid      . MULTIPLE VITAMINS PO Take 1 capsule by mouth daily.        . Multiple Vitamins-Minerals (PRESERVISION AREDS 2 PO) Take 1 tablet by mouth daily.        . Omega-3 Fatty Acids (FISH OIL) 1000 MG CAPS Take 2 capsules by mouth  daily.        . saw palmetto 500 MG capsule Take 500 mg by mouth daily.        . valsartan (DIOVAN) 80 MG tablet Take 80 mg by mouth daily.        [name] warfarin (COUMADIN) 5 MG tablet Take as directed by the anticoagulation clinic.  25 tablet  2  . DISCONTD: Flaxseed, Linseed, (FLAX SEED OIL) 1000 MG CAPS Take 1 capsule by mouth 2 (two) times daily.        [name] DISCONTD: fluticasone (VERAMYST) 27.5 MCG/SPRAY nasal spray 2 sprays by Nasal route daily.        [name] DISCONTD: levocetirizine (XYZAL) 5 MG tablet Take 5 mg by mouth every evening.        [name] DISCONTD: Magnesium 250 MG TABS Take 1 tablet by mouth daily.        [name] DISCONTD: nitroGLYCERIN (NITROSTAT) 0.4 MG SL tablet Place 0.4 mg under the tongue every 5 (five) minutes as needed.        [name] DISCONTD: potassium chloride SA (K-DUR,KLOR-CON) 20 MEQ tablet Take 20 mEq  by mouth daily.        [name] DISCONTD: rosuvastatin (CRESTOR) 20 MG tablet Take 20 mg by mouth daily.        . nitroGLYCERIN (NITROSTAT) 0.4 MG SL tablet Place 1 tablet (0.4 mg total) under the tongue every 5 (five) minutes as needed.  25 tablet  2    Allergies  Allergen Reactions  . Sulfa Antibiotics Rash  . Amiodarone Other (See Comments)    Corneal deposits    Review of Systems negative except from HPI and PMH  Physical Exam Well developed and well nourished in no acute distress HENT normal E scleral and icterus clear Neck Supple JVP flat; carotids brisk and full Clear to ausculation Regular rate and rhythm, no murmurs gallops or rub Soft with active bowel sounds No clubbing cyanosis and edema Alert and oriented, grossly normal motor and sensory function Skin Warm and Dry  ECG AV pacing with a broad QRS  Assessment and  Plan

## 2010-06-19 NOTE — Patient Instructions (Signed)
Your physician has recommended you make the following change in your medication:  1) Decrease carvedilol to 80m one tablet twice daily. 2) Start Inspra 24monce daily.  Your physician recommends that you return for lab work in: 2 weeks- bmet (428.22)  Your physician recommends that you schedule a follow-up appointment in: 3 months.

## 2010-06-19 NOTE — Assessment & Plan Note (Signed)
As noted

## 2010-06-19 NOTE — Assessment & Plan Note (Signed)
These are somewhat better. They were 83/92 in March and 82/80 in May.

## 2010-06-19 NOTE — Assessment & Plan Note (Signed)
Stable on current meds 

## 2010-06-19 NOTE — Assessment & Plan Note (Signed)
The patient's device was interrogated.  The information was reviewed. No changes were made in the programming.    

## 2010-06-19 NOTE — Assessment & Plan Note (Signed)
No recurrent vt  On amiodarone which may be responisble for Elevated transaminases

## 2010-06-26 ENCOUNTER — Ambulatory Visit: Payer: Medicare Other | Admitting: Internal Medicine

## 2010-07-02 ENCOUNTER — Encounter: Payer: Self-pay | Admitting: Family Medicine

## 2010-07-03 ENCOUNTER — Other Ambulatory Visit (INDEPENDENT_AMBULATORY_CARE_PROVIDER_SITE_OTHER): Payer: Medicare Other | Admitting: *Deleted

## 2010-07-03 DIAGNOSIS — I472 Ventricular tachycardia: Secondary | ICD-10-CM

## 2010-07-03 DIAGNOSIS — I4729 Other ventricular tachycardia: Secondary | ICD-10-CM

## 2010-07-03 LAB — BASIC METABOLIC PANEL
BUN: 14 mg/dL (ref 6–23)
Calcium: 9.2 mg/dL (ref 8.4–10.5)
Creatinine, Ser: 0.9 mg/dL (ref 0.4–1.5)
GFR: 85.68 mL/min (ref >60.00)
Glucose, Bld: 105 mg/dL — ABNORMAL HIGH (ref 70–99)

## 2010-07-10 ENCOUNTER — Ambulatory Visit (INDEPENDENT_AMBULATORY_CARE_PROVIDER_SITE_OTHER): Payer: Medicare Other | Admitting: *Deleted

## 2010-07-10 DIAGNOSIS — I4891 Unspecified atrial fibrillation: Secondary | ICD-10-CM

## 2010-07-24 ENCOUNTER — Ambulatory Visit (INDEPENDENT_AMBULATORY_CARE_PROVIDER_SITE_OTHER): Payer: Medicare Other | Admitting: *Deleted

## 2010-07-24 DIAGNOSIS — I4891 Unspecified atrial fibrillation: Secondary | ICD-10-CM

## 2010-07-30 ENCOUNTER — Ambulatory Visit (INDEPENDENT_AMBULATORY_CARE_PROVIDER_SITE_OTHER): Payer: Medicare Other | Admitting: Pulmonary Disease

## 2010-07-30 ENCOUNTER — Encounter: Payer: Self-pay | Admitting: Pulmonary Disease

## 2010-07-30 VITALS — BP 104/80 | HR 60 | Temp 97.5°F | Ht 72.0 in | Wt 241.0 lb

## 2010-07-30 DIAGNOSIS — G4733 Obstructive sleep apnea (adult) (pediatric): Secondary | ICD-10-CM

## 2010-07-30 NOTE — Progress Notes (Signed)
  Subjective:    Patient ID: [name], male    DOB: February 24, 1940, 70 y.o.   MRN: [id number]  HPI The pt comes in today for f/u of his known severe complex apnea.  He has been wearing his bipap machine compliantly, but is having issues with his device.  It is over 21 yrs old, and is making noise along with malfunction of his heated humidifier.  He is waking up with water in his nose and moisture on his face.  He has tried to turn down and off his heater, but no improvement.  He has not tried removing water from reservoir.  He feels he sleeps well if this problem can be corrected.  Denies sleepiness during the day.    Review of Systems  Constitutional: Negative for fever and unexpected weight change.  HENT: Positive for rhinorrhea, sneezing and postnasal drip. Negative for ear pain, nosebleeds, congestion, sore throat, trouble swallowing, dental problem and sinus pressure.   Eyes: Negative for redness and itching.  Respiratory: Positive for cough. Negative for chest tightness, shortness of breath and wheezing.   Cardiovascular: Negative for palpitations and leg swelling.  Gastrointestinal: Negative for nausea and vomiting.  Genitourinary: Positive for dysuria.  Musculoskeletal: Negative for joint swelling.  Skin: Negative for rash.  Neurological: Negative for headaches.  Hematological: Bruises/bleeds easily.  Psychiatric/Behavioral: Negative for dysphoric mood. The patient is not nervous/anxious.        Objective:   Physical Exam Ow male in nad Nares without discharge or purulence, no skin breakdown or pressure necrosis from cpap mask LE without edema, no cyanosis noted. Alert, oriented, does not appear sleepy.  Moves all 4        Assessment & Plan:

## 2010-07-30 NOTE — Patient Instructions (Signed)
Will send order for a new bipap machine, and see if insurance will cover. Try using current machine in the interim without water in humidifier (with heater off). Work on weight loss.  followup with me one year if doing well.

## 2010-07-30 NOTE — Assessment & Plan Note (Signed)
The pt has done very well with his current treatment, but his current machine is now making noise and unable to regulate humidity.  It is more than 69 yrs old, and therefore needs to be replaced.  Will send an order to dme to arrange, and I have encouraged the pt to keep up with mask changes and supplies for maintenance.  I also asked him to work on weight reduction.

## 2010-07-31 ENCOUNTER — Encounter: Payer: Self-pay | Admitting: Pulmonary Disease

## 2010-08-08 ENCOUNTER — Ambulatory Visit (INDEPENDENT_AMBULATORY_CARE_PROVIDER_SITE_OTHER): Payer: Medicare Other | Admitting: *Deleted

## 2010-08-08 DIAGNOSIS — I4891 Unspecified atrial fibrillation: Secondary | ICD-10-CM

## 2010-08-08 LAB — POCT INR: INR: 2

## 2010-08-11 ENCOUNTER — Other Ambulatory Visit: Payer: Self-pay | Admitting: Cardiology

## 2010-08-23 ENCOUNTER — Encounter: Payer: Medicare Other | Admitting: *Deleted

## 2010-08-27 ENCOUNTER — Ambulatory Visit (INDEPENDENT_AMBULATORY_CARE_PROVIDER_SITE_OTHER): Payer: Medicare Other | Admitting: *Deleted

## 2010-08-27 DIAGNOSIS — I4891 Unspecified atrial fibrillation: Secondary | ICD-10-CM

## 2010-09-12 ENCOUNTER — Encounter: Payer: Self-pay | Admitting: Family Medicine

## 2010-09-13 ENCOUNTER — Encounter: Payer: Self-pay | Admitting: Adult Health

## 2010-09-13 ENCOUNTER — Ambulatory Visit (INDEPENDENT_AMBULATORY_CARE_PROVIDER_SITE_OTHER): Payer: Medicare Other | Admitting: Adult Health

## 2010-09-13 ENCOUNTER — Ambulatory Visit (INDEPENDENT_AMBULATORY_CARE_PROVIDER_SITE_OTHER)
Admission: RE | Admit: 2010-09-13 | Discharge: 2010-09-13 | Disposition: A | Discharge status: Home / Self Care | Payer: Medicare Other | Source: Ambulatory Visit | Attending: Adult Health | Admitting: Adult Health

## 2010-09-13 VITALS — BP 112/80 | HR 60 | Temp 97.0°F | Ht 71.0 in | Wt 238.4 lb

## 2010-09-13 DIAGNOSIS — J40 Bronchitis, not specified as acute or chronic: Secondary | ICD-10-CM

## 2010-09-13 MED ORDER — FLUTICASONE PROPIONATE 50 MCG/ACT NA SUSP: 2.0000 | Freq: Every day | NASAL | Status: DC

## 2010-09-13 MED ORDER — LEVOCETIRIZINE DIHYDROCHLORIDE 5 MG PO TABS: 5.0000 mg | ORAL_TABLET | Freq: Every evening | ORAL | Status: DC

## 2010-09-13 MED ORDER — CEFDINIR 300 MG PO CAPS: 300.0000 mg | ORAL_CAPSULE | Freq: Two times a day (BID) | ORAL | Status: AC

## 2010-09-13 NOTE — Assessment & Plan Note (Signed)
Flare  Xray pending.  Does have some upper airway wheeze however seems isolated incidence. If recurrent may  Consider per cards for a more selective beta blocker than coreg (ie bystolic)  Plan:  Omnicef 351m Twice daily  For 7 days  Mucinex DM Twice daily  As needed  Cough/congestion Fluids and rest.  Xyzal 540mAt bedtime  As needed  Drainage.  Saline nasal rinses As needed   Fluticasone Nasal 2 puffs Twice daily   Please contact office for sooner follow up if symptoms do not improve or worsen or seek emergency care  follow up Dr. ClGwenette Greets planned and As needed   I will call with xray results  Please alert coumadin clinic that you have started on antibiotic.

## 2010-09-13 NOTE — Progress Notes (Signed)
Visit reviewed.  Have never seen pt for pulmonary issues, just OSA.  Agree with plan as outlined

## 2010-09-13 NOTE — Progress Notes (Signed)
  Subjective:    Patient ID: [name], male    DOB: 1940/07/01, 70 y.o.   MRN: [id number]  HPI 70 yo male with with known hx of OSA, HTN, Atrial fib on chronic coumadin and CHF  09/13/2010 Acute OV  Pt presents for an acute office visit. Complains of  chest tightness and wheezing with deep breath - Hacky, dry cough - Occas prod (clear, light yellow) .  Wheezing at night when lying down - Mild pain in left ribs at times. Symptoms started 2 weeks ago. No leg edema , no orthopnea. Wears BIPAP every night without difficulty.  No hemoptysis. Weight is stable over last 6 months w/ only 2 lbs variations.  No otc used for cough. No recent travel, abx use or hospitlizations.   Review of Systems Constitutional:   No  weight loss, night sweats,  Fevers, chills, fatigue, or  lassitude.  HEENT:   No headaches,  Difficulty swallowing,  Tooth/dental problems, or  Sore throat,                Mild sneezing, itching, , nasal congestion, post nasal drip,   CV:  No chest pain,  Orthopnea, PND, swelling in lower extremities, anasarca, dizziness, palpitations, syncope.   GI  No heartburn, indigestion, abdominal pain, nausea, vomiting, diarrhea, change in bowel habits, loss of appetite, bloody stools.   Resp: No shortness of breath with exertion or at rest.    No coughing up of blood.     No chest wall deformity  Skin: no rash or lesions.  GU: no dysuria, change in color of urine, no urgency or frequency.  No flank pain, no hematuria   MS:  No joint pain or swelling.  No decreased range of motion.  No back pain.  Psych:  No change in mood or affect. No depression or anxiety.  No memory loss.         Objective:   Physical Exam GEN: A/Ox3; pleasant , NAD, well nourished   HEENT:  [place]/AT,  EACs-clear, TMs-wnl, NOSE-clear drainage , THROAT-clear, no lesions, no postnasal drip or exudate noted.   NECK:  Supple w/ fair ROM; no JVD; normal carotid impulses w/o bruits; no thyromegaly or nodules palpated;  no lymphadenopathy.  RESP  Coarse BS w/ few basilar crackles, psuedowheeze on forced exp. no accessory muscle use, no dullness to percussion  CARD:  RRR, no m/r/g  , no peripheral edema, pulses intact, no cyanosis or clubbing.  GI:   Soft & nt; nml bowel sounds; no organomegaly or masses detected.  Musco: Warm bil, no deformities or joint swelling noted.   Neuro: alert, no focal deficits noted.    Skin: Warm, no lesions or rashes         Assessment & Plan:

## 2010-09-13 NOTE — Progress Notes (Signed)
Addended by: Doroteo Glassman D on: 09/13/2010 09:58 AM   Modules accepted: Orders

## 2010-09-13 NOTE — Patient Instructions (Addendum)
Omnicef 363m Twice daily  For 7 days  Mucinex DM Twice daily  As needed  Cough/congestion Fluids and rest.  Xyzal 563mAt bedtime  As needed  Drainage.  Saline nasal rinses As needed   Fluticasone Nasal 2 puffs Twice daily   Please contact office for sooner follow up if symptoms do not improve or worsen or seek emergency care  follow up Dr. ClGwenette Greets planned and As needed   I will call with xray results  Please alert coumadin clinic that you have started on antibiotic.

## 2010-09-14 ENCOUNTER — Ambulatory Visit (INDEPENDENT_AMBULATORY_CARE_PROVIDER_SITE_OTHER): Payer: Medicare Other | Admitting: Cardiology

## 2010-09-14 ENCOUNTER — Other Ambulatory Visit: Payer: Self-pay | Admitting: *Deleted

## 2010-09-14 ENCOUNTER — Encounter: Payer: Self-pay | Admitting: Cardiology

## 2010-09-14 DIAGNOSIS — I472 Ventricular tachycardia, unspecified: Secondary | ICD-10-CM

## 2010-09-14 DIAGNOSIS — I2510 Atherosclerotic heart disease of native coronary artery without angina pectoris: Secondary | ICD-10-CM | POA: Insufficient documentation

## 2010-09-14 DIAGNOSIS — R0602 Shortness of breath: Secondary | ICD-10-CM

## 2010-09-14 DIAGNOSIS — Z95810 Presence of automatic (implantable) cardiac defibrillator: Secondary | ICD-10-CM

## 2010-09-14 DIAGNOSIS — I10 Essential (primary) hypertension: Secondary | ICD-10-CM

## 2010-09-14 DIAGNOSIS — E785 Hyperlipidemia, unspecified: Secondary | ICD-10-CM

## 2010-09-14 DIAGNOSIS — I4729 Other ventricular tachycardia: Secondary | ICD-10-CM

## 2010-09-14 DIAGNOSIS — I4891 Unspecified atrial fibrillation: Secondary | ICD-10-CM

## 2010-09-14 DIAGNOSIS — I5022 Chronic systolic (congestive) heart failure: Secondary | ICD-10-CM

## 2010-09-14 DIAGNOSIS — I2589 Other forms of chronic ischemic heart disease: Secondary | ICD-10-CM

## 2010-09-14 LAB — HEPATIC FUNCTION PANEL
ALT: 60 U/L — ABNORMAL HIGH (ref 0–53)
Alkaline Phosphatase: 56 U/L (ref 39–117)
Bilirubin, Direct: 0 mg/dL (ref 0.0–0.3)
Total Bilirubin: 1 mg/dL (ref 0.3–1.2)
Total Protein: 6.5 g/dL (ref 6.0–8.3)

## 2010-09-14 LAB — BASIC METABOLIC PANEL
BUN: 16 mg/dL (ref 6–23)
CO2: 25 mEq/L (ref 19–32)
Chloride: 108 mEq/L (ref 96–112)
Glucose, Bld: 103 mg/dL — ABNORMAL HIGH (ref 70–99)
Potassium: 4.1 mEq/L (ref 3.5–5.1)
Sodium: 141 mEq/L (ref 135–145)

## 2010-09-14 MED ORDER — WARFARIN SODIUM 5 MG PO TABS: ORAL_TABLET | ORAL | Status: DC

## 2010-09-14 NOTE — Assessment & Plan Note (Signed)
Management of her electrophysiology.

## 2010-09-14 NOTE — Progress Notes (Signed)
HPI: [name] is a pleasant gentleman with a history of coronary artery disease, mixed ischemic and nonischemic cardiomyopathy, history of ICD, paroxysmal atrial fibrillation, and ventricular tachycardia. Previous carotid Dopplers in April 2006 showed 0-39% stenosis bilaterally. Previous abdominal ultrasound in April 2006 showed no aneurysm. Last cardiac catheterization in October 2008 showed an occluded right coronary artery with left-to-right collateralization. Ejection fraction is 20%. There was no other coronary disease noted. Last Myoview in Dec 2011 showed prior inferior infarct but no ischemia. His ejection fraction was 19%. ABIs in December of 2011 were normal. An echocardiogram in March of 2012 showed an ejection fraction of 15-20%, possible thrombus, mild MR and reduced RV function. The patient has had upgrade of his device to CRT-D. I last saw him in March of 2012. Since then, the patient has noticed some increased fullness with lying flat which improves with sitting up. He has had a cough productive of yellow sputum and was started on antibiotics pulmonary yesterday. He denies dyspnea on exertion, pedal edema, chest pain or syncope.  Current Outpatient Prescriptions  Medication Sig Dispense Refill  . amiodarone (PACERONE) 200 MG tablet Take 200 mg by mouth daily.       . Calcium Carbonate (CALTRATE 600 PO) Take 1 tablet by mouth 2 (two) times daily.        . carvedilol (COREG) 25 MG tablet Take 1 tablet (25 mg total) by mouth 2 (two) times daily.  60 tablet  11  . cefdinir (OMNICEF) 300 MG capsule Take 1 capsule (300 mg total) by mouth 2 (two) times daily.  14 capsule  0  . eplerenone (INSPRA) 25 MG tablet Take 1 tablet (25 mg total) by mouth daily.  30 tablet  6  . fluticasone (FLONASE) 50 MCG/ACT nasal spray Place 2 sprays into the nose daily.  16 g  12  . furosemide (LASIX) 40 MG tablet Take 1 tablet (40 mg total) by mouth daily.      [name] levocetirizine (XYZAL) 5 MG tablet Take 1 tablet (5  mg total) by mouth every evening.  30 tablet  3  . mexiletine (MEXITIL) 150 MG capsule Take 300 mg by mouth 2 (two) times daily.       . MULTIPLE VITAMINS PO Take 1 capsule by mouth daily.        . Multiple Vitamins-Minerals (PRESERVISION AREDS 2 PO) Take 1 tablet by mouth daily.        . nitroGLYCERIN (NITROSTAT) 0.4 MG SL tablet Place 1 tablet (0.4 mg total) under the tongue every 5 (five) minutes as needed.  25 tablet  2  . Omega-3 Fatty Acids (FISH OIL) 1000 MG CAPS Take 2 capsules by mouth daily.        . saw palmetto 500 MG capsule Take 500 mg by mouth daily.        . valsartan (DIOVAN) 80 MG tablet Take 80 mg by mouth daily.        [name] warfarin (COUMADIN) 5 MG tablet Take as directed by the anticoagulation clinic.  25 tablet  2     Past Medical History  Diagnosis Date  . Atrial fibrillation     Ablation for a-flutter 05/2000  . Cardiomyopathy     Ischemic.  EF 20%  . Hypertension   . Hyperlipidemia   . Allergy     rhinitis  . Obstructive sleep apnea     CPAP (Dr. Gwenette Greet)  . CAD (coronary artery disease)     PTCA RCA 1997 (Inf/post  MI)  . Ventricular tachycardia     ICD, pacer  . Adenomatous colon polyp     Colonoscopy 07/2007---Dr. Watt Climes, [facility] (Rpt 5 yrs recommended)  . Kidney stones     No distal stones on CT 2010  . Myocardial infarction   . Microscopic hematuria   . Diverticulosis of colon 2005    Noted on Colonoscopies in 2005 and 2009 and on CT 2010  . Internal hemorrhoids     Colonoscopy 07/2007  . Hepatic steatosis 2010    Noted on CT abd 2010 and on u/s abd 04/2009 (transaminasemia)  . Allergic rhinitis   . BPH (benign prostatic hypertrophy)     PSA 04/15/07 was 1.44  . History of impaired glucose tolerance   . Chronic bronchitis     by CXR     Past Surgical History  Procedure Date  . [place] Contak K820- Virl Axe, MD 07/25/06 (second device)  . Breast surgery 1990    "Tissue removed from breast"  . Umbilical hernia repair   . Hernia repair  1975    bilateral inguinal  . Extracorporeal shock wave lithotripsy   . Atrial ablation surgery     History   Social History  . Marital Status: Married    Spouse Name: N/A    Number of Children: N/A  . Years of Education: N/A   Occupational History  . Retired    Social History Main Topics  . Smoking status: Former Smoker -- 2.0 packs/day for 15 years    Types: Cigarettes    Quit date: 01/15/1980  . Smokeless tobacco: Never Used  . Alcohol Use: Yes     socially  . Drug Use: No  . Sexually Active: Not on file   Other Topics Concern  . Not on file   Social History Narrative  . No narrative on file    ROS: no fevers or chills, productive cough, hemoptysis, dysphasia, odynophagia, melena, hematochezia, dysuria, hematuria, rash, seizure activity, orthopnea, PND, pedal edema, claudication. Remaining systems are negative.  Physical Exam: Well-developed well-nourished in no acute distress.  Skin is warm and dry.  HEENT is normal.  Neck is supple. No thyromegaly.  Chest is clear to auscultation with normal expansion. Pacemaker noted. Cardiovascular exam is regular rate and rhythm. 2/6 systolic murmur apex. Abdominal exam nontender or distended. No masses palpated. Extremities show no edema. neuro grossly intact  ECG Ventricular pacing

## 2010-09-14 NOTE — Assessment & Plan Note (Signed)
Not on statin secondary to increased liver functions.

## 2010-09-14 NOTE — Patient Instructions (Addendum)
Your physician recommends that you return for lab work in: tsh/liver/bmet/bnp (428.23;414.01).  Your physician has recommended you make the following change in your medication:  1) Take an additional lasix 69m when you get home today. You may also take an extra lasix 4106ma day as needed for weight gain.  Your physician recommends that you schedule a follow-up appointment in: 4 months.

## 2010-09-14 NOTE — Assessment & Plan Note (Signed)
Continue Coumadin, beta blocker.

## 2010-09-14 NOTE — Assessment & Plan Note (Addendum)
Continue amiodarone and mexiletine. Followed by electrophysiology. Given amiodarone use check TSH and liver function. Note his liver functions have been mildly elevated previously and his statin was discontinued. Recent chest x-ray unremarkable.

## 2010-09-14 NOTE — Assessment & Plan Note (Signed)
Continue ARB and beta blocker. 

## 2010-09-14 NOTE — Assessment & Plan Note (Signed)
Continue present medications. 

## 2010-09-14 NOTE — Assessment & Plan Note (Signed)
Blood pressure controlled. Continue present medications. 

## 2010-09-14 NOTE — Assessment & Plan Note (Signed)
Patient is describing some orthopnea. There may also be a component of bronchitis. He will take an additional 40 mg of Lasix this afternoon. I have asked him to take 40 mg of Lasix in addition to his normal dose p.r.n. Increased weight gain of 2-3 pounds. Check potassium and renal function as well as BNP.

## 2010-09-18 ENCOUNTER — Encounter: Payer: Self-pay | Admitting: Family Medicine

## 2010-09-18 ENCOUNTER — Ambulatory Visit (INDEPENDENT_AMBULATORY_CARE_PROVIDER_SITE_OTHER): Payer: Medicare Other | Admitting: Family Medicine

## 2010-09-18 DIAGNOSIS — Z95810 Presence of automatic (implantable) cardiac defibrillator: Secondary | ICD-10-CM

## 2010-09-18 DIAGNOSIS — J309 Allergic rhinitis, unspecified: Secondary | ICD-10-CM

## 2010-09-18 DIAGNOSIS — J40 Bronchitis, not specified as acute or chronic: Secondary | ICD-10-CM

## 2010-09-18 DIAGNOSIS — I2589 Other forms of chronic ischemic heart disease: Secondary | ICD-10-CM

## 2010-09-18 DIAGNOSIS — E785 Hyperlipidemia, unspecified: Secondary | ICD-10-CM

## 2010-09-18 NOTE — Assessment & Plan Note (Signed)
Stable.  Continue routine cardiology f/u.

## 2010-09-18 NOTE — Assessment & Plan Note (Signed)
Continue xyzal 67m qd.  Discussed switching antihistamines in the spring of 2013 if xyzal loses its efficacy. Continue flonase qd.

## 2010-09-18 NOTE — Assessment & Plan Note (Signed)
Resolved.  Of note, CXR at the end of August was stable--no acute problem.

## 2010-09-18 NOTE — Progress Notes (Signed)
OFFICE NOTE  09/18/2010  CC:  Chief Complaint  Patient presents with  . Hypertension    FOLLOW UP     HPI:   Patient is a 70 y.o. Caucasian male who is here for routine f/u. Has mild chronic allergic rhinitis symptoms: mainly PND and periodic eustacian tube dysfunction + throat irritation/discomfort on and off.   Recently has had hacking cough and intermittent wheezing which has resolved over the last 2-3 days---feels 90% improved per his report today.  He was put on omnicef 09/13/10 by pulmonary clinic.   Recent cardiology f/u was good, LFTs improved, BNP ok.  He gets PT/INR monitoring with cards.   Pertinent PMH:  Past Medical History  Diagnosis Date  . Atrial fibrillation     Ablation for a-flutter 05/2000  . Cardiomyopathy     Ischemic.  EF 20%  . Hypertension   . Hyperlipidemia   . Allergy     rhinitis  . Obstructive sleep apnea     CPAP (Dr. Gwenette Greet)  . CAD (coronary artery disease)     PTCA RCA 1997 (Inf/post MI)  . Ventricular tachycardia     ICD, pacer  . Adenomatous colon polyp     Colonoscopy 07/2007---Dr. Watt Climes, [facility] (Rpt 5 yrs recommended)  . Kidney stones     No distal stones on CT 2010  . Myocardial infarction   . Microscopic hematuria   . Diverticulosis of colon 2005    Noted on Colonoscopies in 2005 and 2009 and on CT 2010  . Internal hemorrhoids     Colonoscopy 07/2007  . Hepatic steatosis 2010    Noted on CT abd 2010 and on u/s abd 04/2009 (transaminasemia)  . Allergic rhinitis   . BPH (benign prostatic hypertrophy)     PSA 04/15/07 was 1.44  . History of impaired glucose tolerance   . Chronic bronchitis     by CXR    Past Surgical History  Procedure Date  . [place] Contak [id number]- Virl Axe, MD 07/25/06 (second device)  . Breast surgery 1990    "Tissue removed from breast"  . Umbilical hernia repair   . Hernia repair 1975    bilateral inguinal  . Extracorporeal shock wave lithotripsy   . Atrial ablation surgery     MEDS;     Outpatient Prescriptions Prior to Visit  Medication Sig Dispense Refill  . amiodarone (PACERONE) 200 MG tablet Take 200 mg by mouth daily.       . Calcium Carbonate (CALTRATE 600 PO) Take 1 tablet by mouth 2 (two) times daily.        . carvedilol (COREG) 25 MG tablet Take 1 tablet (25 mg total) by mouth 2 (two) times daily.  60 tablet  11  . cefdinir (OMNICEF) 300 MG capsule Take 1 capsule (300 mg total) by mouth 2 (two) times daily.  14 capsule  0  . eplerenone (INSPRA) 25 MG tablet Take 1 tablet (25 mg total) by mouth daily.  30 tablet  6  . fluticasone (FLONASE) 50 MCG/ACT nasal spray Place 2 sprays into the nose daily.  16 g  12  . furosemide (LASIX) 40 MG tablet Take 1 tablet (40 mg total) by mouth daily.      [name] levocetirizine (XYZAL) 5 MG tablet Take 1 tablet (5 mg total) by mouth every evening.  30 tablet  3  . mexiletine (MEXITIL) 150 MG capsule Take 300 mg by mouth 2 (two) times daily.       [name]  MULTIPLE VITAMINS PO Take 1 capsule by mouth daily.        . Multiple Vitamins-Minerals (PRESERVISION AREDS 2 PO) Take 1 tablet by mouth daily.        . nitroGLYCERIN (NITROSTAT) 0.4 MG SL tablet Place 1 tablet (0.4 mg total) under the tongue every 5 (five) minutes as needed.  25 tablet  2  . Omega-3 Fatty Acids (FISH OIL) 1000 MG CAPS Take 2 capsules by mouth daily.        . saw palmetto 500 MG capsule Take 500 mg by mouth daily.        . valsartan (DIOVAN) 80 MG tablet Take 80 mg by mouth daily.        [name] warfarin (COUMADIN) 5 MG tablet Take as directed by the anticoagulation clinic.  30 tablet  3    PE: Blood pressure 112/76, pulse 59, temperature 98.1 F (36.7 C), temperature source Oral, weight 240 lb (108.863 kg). Gen: Alert, well appearing.  Patient is oriented to person, place, time, and situation. HEENT: Scalp without lesions or hair loss.  Ears: EACs clear, normal epithelium.  TMs with good light reflex and landmarks bilaterally.  Eyes: no injection, icteris, swelling, or exudate.   EOMI, PERRLA. Nose: no drainage or turbinate edema/swelling.  No injection or focal lesion.  Mouth: lips without lesion/swelling.  Oral mucosa pink and moist.  Dentition intact and without obvious caries or gingival swelling.  Oropharynx without erythema, exudate, or swelling.  Chest: symmetric expansion, nonlabored respirations.  Clear and equal breath sounds in all lung fields.   CV: RRR, distant S1 and S2, soft systolic murmur best heard at apex.  Peripheral pulses 2+ and symmetric. EXT: no clubbing, cyanosis, or edema.    IMPRESSION AND PLAN:  Bronchitis Resolved.  Of note, CXR at the end of August was stable--no acute problem.  ALLERGIC RHINITIS CAUSE UNSPECIFIED Continue xyzal 86m qd.  Discussed switching antihistamines in the spring of 2013 if xyzal loses its efficacy. Continue flonase qd.  CARDIOMYOPATHY, ISCHEMIC Stable.  Continue routine cardiology f/u.  ICD (implantable cardiac defibrillator), biventricular, [employer] Routine f/u Dr. KCaryl Comes   Hyperlipidemia: encouraged exercise, low chol/low fat diet, wt loss----he has had elevated LFTs on statin.  Has ongoing mild elevation of LFTs even while off statin but this is possibly secondary to amiodarone.  Last lipids 03/2010 were okay.  He is not good candidate for cholestyramine b/c he has "problems with GI upset quite a bit already".  FOLLOW UP:  Return in about 6 months (around 03/18/2011) for routine f/u.

## 2010-09-18 NOTE — Assessment & Plan Note (Signed)
Routine f/u Dr. Caryl Comes.

## 2010-09-19 ENCOUNTER — Other Ambulatory Visit: Payer: Self-pay | Admitting: *Deleted

## 2010-09-19 MED ORDER — MEXILETINE HCL 150 MG PO CAPS: 300.0000 mg | ORAL_CAPSULE | Freq: Two times a day (BID) | ORAL | Status: DC

## 2010-09-20 ENCOUNTER — Telehealth: Payer: Self-pay | Admitting: Internal Medicine

## 2010-09-20 ENCOUNTER — Ambulatory Visit (INDEPENDENT_AMBULATORY_CARE_PROVIDER_SITE_OTHER): Payer: Medicare Other | Admitting: *Deleted

## 2010-09-20 DIAGNOSIS — I4891 Unspecified atrial fibrillation: Secondary | ICD-10-CM

## 2010-09-20 NOTE — Telephone Encounter (Signed)
Will forward to [place]. I think this for Dr. Stanford Breed.

## 2010-09-20 NOTE — Telephone Encounter (Signed)
Returning call back to nurse [place].

## 2010-09-20 NOTE — Telephone Encounter (Signed)
Pt returning call to [place], [place] is standing in lobby now. Please return call.

## 2010-09-21 NOTE — Telephone Encounter (Signed)
PT AWARE OF LAB RESULTS./CY

## 2010-09-27 ENCOUNTER — Ambulatory Visit: Payer: Medicare Other | Admitting: Cardiology

## 2010-10-04 ENCOUNTER — Ambulatory Visit (INDEPENDENT_AMBULATORY_CARE_PROVIDER_SITE_OTHER): Payer: Medicare Other | Admitting: *Deleted

## 2010-10-04 DIAGNOSIS — I4891 Unspecified atrial fibrillation: Secondary | ICD-10-CM

## 2010-10-16 ENCOUNTER — Encounter: Payer: Self-pay | Admitting: Internal Medicine

## 2010-10-16 ENCOUNTER — Ambulatory Visit (INDEPENDENT_AMBULATORY_CARE_PROVIDER_SITE_OTHER): Payer: Medicare Other | Admitting: *Deleted

## 2010-10-16 ENCOUNTER — Ambulatory Visit (INDEPENDENT_AMBULATORY_CARE_PROVIDER_SITE_OTHER): Payer: Medicare Other | Admitting: Internal Medicine

## 2010-10-16 DIAGNOSIS — Z95810 Presence of automatic (implantable) cardiac defibrillator: Secondary | ICD-10-CM

## 2010-10-16 DIAGNOSIS — T82190A Other mechanical complication of cardiac electrode, initial encounter: Secondary | ICD-10-CM

## 2010-10-16 DIAGNOSIS — I4729 Other ventricular tachycardia: Secondary | ICD-10-CM

## 2010-10-16 DIAGNOSIS — I5022 Chronic systolic (congestive) heart failure: Secondary | ICD-10-CM

## 2010-10-16 DIAGNOSIS — R7401 Elevation of levels of liver transaminase levels: Secondary | ICD-10-CM

## 2010-10-16 DIAGNOSIS — R0989 Other specified symptoms and signs involving the circulatory and respiratory systems: Secondary | ICD-10-CM

## 2010-10-16 DIAGNOSIS — I4891 Unspecified atrial fibrillation: Secondary | ICD-10-CM

## 2010-10-16 DIAGNOSIS — I472 Ventricular tachycardia: Secondary | ICD-10-CM

## 2010-10-16 DIAGNOSIS — I2589 Other forms of chronic ischemic heart disease: Secondary | ICD-10-CM

## 2010-10-16 DIAGNOSIS — I2510 Atherosclerotic heart disease of native coronary artery without angina pectoris: Secondary | ICD-10-CM

## 2010-10-16 DIAGNOSIS — R0600 Dyspnea, unspecified: Secondary | ICD-10-CM

## 2010-10-16 DIAGNOSIS — R0609 Other forms of dyspnea: Secondary | ICD-10-CM

## 2010-10-16 LAB — ICD DEVICE OBSERVATION
AL AMPLITUDE: 2 mv
AL IMPEDENCE ICD: 521 Ohm
BAMS-0001: 170 {beats}/min
BAMS-0003: 70 {beats}/min
CHARGE TIME: 8.459 s
DEVICE MODEL ICD: 492371
LV LEAD IMPEDENCE ICD: 756 Ohm
LV LEAD THRESHOLD: 7 V
RV LEAD IMPEDENCE ICD: 643 Ohm
RV LEAD THRESHOLD: 1.2 V
TZAT-0001FASTVT: 1
TZAT-0001FASTVT: 2
TZAT-0001SLOWVT: 1
TZAT-0004FASTVT: 8
TZAT-0004FASTVT: 8
TZAT-0004SLOWVT: 20
TZAT-0005FASTVT: 81 pct
TZAT-0005FASTVT: 81 pct
TZAT-0005SLOWVT: 75 pct
TZAT-0012SLOWVT: 200 ms
TZAT-0013FASTVT: 2
TZAT-0019SLOWVT: 5 V
TZAT-0020SLOWVT: 1 ms
TZON-0003SLOWVT: 600 ms
TZON-0005FASTVT: 1
TZST-0001FASTVT: 4
TZST-0001FASTVT: 6
TZST-0001SLOWVT: 3
TZST-0001SLOWVT: 4
TZST-0001SLOWVT: 6
TZST-0002SLOWVT: NEGATIVE
TZST-0002SLOWVT: NEGATIVE
TZST-0003FASTVT: 26 J
TZST-0003FASTVT: 41 J
TZST-0003FASTVT: 41 J

## 2010-10-16 LAB — BASIC METABOLIC PANEL
CO2: 27 mEq/L (ref 19–32)
Chloride: 105 mEq/L (ref 96–112)
Glucose, Bld: 91 mg/dL (ref 70–99)
Potassium: 4.2 mEq/L (ref 3.5–5.1)
Sodium: 139 mEq/L (ref 135–145)

## 2010-10-16 LAB — HEPATIC FUNCTION PANEL
ALT: 58 U/L — ABNORMAL HIGH (ref 0–53)
AST: 57 U/L — ABNORMAL HIGH (ref 0–37)
Albumin: 3.8 g/dL (ref 3.5–5.2)
Alkaline Phosphatase: 62 U/L (ref 39–117)
Total Protein: 6.5 g/dL (ref 6.0–8.3)

## 2010-10-16 NOTE — Patient Instructions (Addendum)
Your physician recommends that you return for lab work today: bmp/liver/bnp [phone]).  Your physician wants you to follow-up in: 1 year with Dr. Caryl Comes.  You will receive a reminder letter in the mail two months in advance. If you don't receive a letter, please call our office to schedule the follow-up appointment.  Increase lasix to 22m two tablets daily for 7 days.

## 2010-10-16 NOTE — Assessment & Plan Note (Signed)
The patient's device was interrogated.  The information was reviewed. No changes were made in the programming.    

## 2010-10-16 NOTE — Assessment & Plan Note (Signed)
No sustained VT

## 2010-10-16 NOTE — Assessment & Plan Note (Signed)
No intercurrent afib

## 2010-10-16 NOTE — Assessment & Plan Note (Signed)
Will continue to monitor.

## 2010-10-16 NOTE — Assessment & Plan Note (Signed)
His weight is up and there is abdominal distention with some shortness of breath. We'll have him increase his Lasix for 7 days. We will check his BNP today.

## 2010-10-16 NOTE — Assessment & Plan Note (Signed)
Continue current meds 

## 2010-10-16 NOTE — Assessment & Plan Note (Signed)
He has continued to decrease. I used at the end of August were closer to but not yet normal

## 2010-10-16 NOTE — Progress Notes (Signed)
HPI  [name] is a 70 y.o. male Seen in followup for congestive heart failure in the setting of ischemic heart disease and previously implanted CRT-D with subsequent left ventricular lead macro dislodgment. His symptoms however been quite stable. When he saw Dr. Lovena [name] concerning lead extraction and lead implantation he was doing as well as he had for some time and it was decided not to pursue it then. He has had some intercurrent worsening; however, this stabilized .  Of late he was at the beach and has a 10 pound weight gain associated with some abdominal fullness and dyspnea on exertion.  He also has ventricular tachycardia for which he takes amiodarone. LFTs were abnormal and his Crestor was stopped. Repeat LFTs remained elevated when they were last checked in August.  Last cardiac catheterization in October 2008 showed an occluded right coronary artery with left-to-right collateralization. Ejection fraction is 20%. There was no other coronary disease noted. Last Myoview in June of 2010 showed prior inferior infarct but no ischemia. His ejection fraction was 21%previous carotid Dopplers in April 2006 showed 0-39% stenosis bilaterally. Previous abdominal ultrasound in April 2006 showed no aneurysm.[name] l Past Medical History    Past Medical History  Diagnosis Date  . Atrial fibrillation     Ablation for a-flutter 05/2000  . Cardiomyopathy     Ischemic.  EF 20%  . Hypertension   . Hyperlipidemia   . Allergy     rhinitis  . Obstructive sleep apnea     CPAP (Dr. Gwenette [name])  . CAD (coronary artery disease)     PTCA RCA 1997 (Inf/post MI)  . Ventricular tachycardia     ICD, pacer  . Adenomatous colon polyp     Colonoscopy 07/2007---Dr. Watt [name], [facility] (Rpt 5 yrs recommended)  . Kidney stones     No distal stones on CT 2010  . Myocardial infarction   . Microscopic hematuria   . Diverticulosis of colon 2005    Noted on Colonoscopies in 2005 and 2009 and on CT 2010  . Internal  hemorrhoids     Colonoscopy 07/2007  . Hepatic steatosis 2010    Noted on CT abd 2010 and on u/s abd 04/2009 (transaminasemia)  . Allergic rhinitis   . BPH (benign prostatic hypertrophy)     PSA 04/15/07 was 1.44  . History of impaired glucose tolerance   . Chronic bronchitis     by CXR     Past Surgical History  Procedure Date  . [place] Contak Q206- Virl Axe, MD 07/25/06 (second device)  . Breast surgery 1990    "Tissue removed from breast"  . Umbilical hernia repair   . Hernia repair 1975    bilateral inguinal  . Extracorporeal shock wave lithotripsy   . Atrial ablation surgery     Current Outpatient Prescriptions  Medication Sig Dispense Refill  . amiodarone (PACERONE) 200 MG tablet Take 200 mg by mouth daily.       . Calcium Carbonate (CALTRATE 600 PO) Take 1 tablet by mouth 2 (two) times daily.        . carvedilol (COREG) 25 MG tablet Take 1 tablet (25 mg total) by mouth 2 (two) times daily.  60 tablet  11  . eplerenone (INSPRA) 25 MG tablet Take 1 tablet (25 mg total) by mouth daily.  30 tablet  6  . fluticasone (FLONASE) 50 MCG/ACT nasal spray Place 2 sprays into the nose daily.  16 g  12  . furosemide (LASIX) 40 MG tablet Take 1 tablet (40 mg total) by mouth daily.      [name] levocetirizine (XYZAL) 5 MG tablet Take 1 tablet (5 mg total) by mouth every evening.  30 tablet  3  . mexiletine (MEXITIL) 150 MG capsule Take 2 capsules (300 mg total) by mouth 2 (two) times daily.  120 capsule  11  . MULTIPLE VITAMINS PO Take 1 capsule by mouth daily.        . Multiple Vitamins-Minerals (PRESERVISION AREDS 2 PO) Take 1 tablet by mouth daily.        . nitroGLYCERIN (NITROSTAT) 0.4 MG SL tablet Place 1 tablet (0.4 mg total) under the tongue every 5 (five) minutes as needed.  25 tablet  2  . Omega-3 Fatty Acids (FISH OIL) 1000 MG CAPS Take 2 capsules by mouth daily.        . saw palmetto 500 MG capsule Take 500 mg by mouth daily.        . valsartan (DIOVAN) 80 MG tablet Take  80 mg by mouth daily.        [name] warfarin (COUMADIN) 5 MG tablet Take as directed by the anticoagulation clinic.  30 tablet  3    Allergies  Allergen Reactions  . Sulfa Antibiotics Rash  . Amiodarone Other (See Comments)    Corneal deposits    Review of Systems negative except from HPI and PMH  Physical Exam Well developed and well nourished in no acute distress HENT normal E scleral and icterus clear Neck Supple JVP 7-8 cm with minimal HJR; carotids brisk and full Clear to ausculation Regular rate and rhythm, no murmurs gallops or rub Soft with active bowel sounds No clubbing cyanosis trace edema Alert and oriented, grossly normal motor and sensory function Skin Warm and Dry  ECG P. Synchronous pacing with a broad QRS  Assessment and  Plan

## 2010-10-16 NOTE — Assessment & Plan Note (Signed)
I don't think that this is contributing to his change in symptoms. This is probably stable. Continue current medications

## 2010-10-24 ENCOUNTER — Encounter: Payer: Medicare Other | Admitting: *Deleted

## 2010-10-30 LAB — BASIC METABOLIC PANEL
BUN: 18
CO2: 22
Chloride: 107
Creatinine, Ser: 0.83
Glucose, Bld: 113 — ABNORMAL HIGH

## 2010-10-30 LAB — CBC
MCHC: 34.2
MCV: 99.2
Platelets: 244

## 2010-10-30 LAB — PROTIME-INR: Prothrombin Time: 30.9 — ABNORMAL HIGH

## 2010-10-31 ENCOUNTER — Telehealth: Payer: Self-pay | Admitting: Internal Medicine

## 2010-10-31 NOTE — Telephone Encounter (Signed)
Pt returning call to [facility]. Please call back.

## 2010-10-31 NOTE — Telephone Encounter (Signed)
The patient is aware of his results. He will come in 3 months for a repeat liver function test. I will call him closer to time to schedule this for him. He is agreeable.

## 2010-11-06 ENCOUNTER — Other Ambulatory Visit: Payer: Self-pay | Admitting: Cardiology

## 2010-11-07 ENCOUNTER — Ambulatory Visit (INDEPENDENT_AMBULATORY_CARE_PROVIDER_SITE_OTHER): Payer: Medicare Other | Admitting: *Deleted

## 2010-11-07 DIAGNOSIS — I4891 Unspecified atrial fibrillation: Secondary | ICD-10-CM

## 2010-11-07 DIAGNOSIS — Z7901 Long term (current) use of anticoagulants: Secondary | ICD-10-CM

## 2010-11-21 ENCOUNTER — Other Ambulatory Visit: Payer: Self-pay | Admitting: Cardiology

## 2010-11-22 NOTE — Telephone Encounter (Signed)
rx request 

## 2010-12-05 ENCOUNTER — Ambulatory Visit (INDEPENDENT_AMBULATORY_CARE_PROVIDER_SITE_OTHER): Payer: Medicare Other | Admitting: *Deleted

## 2010-12-05 DIAGNOSIS — Z7901 Long term (current) use of anticoagulants: Secondary | ICD-10-CM

## 2010-12-05 DIAGNOSIS — I4891 Unspecified atrial fibrillation: Secondary | ICD-10-CM

## 2010-12-12 ENCOUNTER — Other Ambulatory Visit: Payer: Self-pay | Admitting: Internal Medicine

## 2010-12-12 ENCOUNTER — Other Ambulatory Visit: Payer: Self-pay | Admitting: Cardiology

## 2010-12-13 ENCOUNTER — Other Ambulatory Visit: Payer: Self-pay

## 2010-12-13 MED ORDER — AMIODARONE HCL 200 MG PO TABS: 200.0000 mg | ORAL_TABLET | Freq: Every day | ORAL | Status: DC

## 2010-12-26 ENCOUNTER — Other Ambulatory Visit: Payer: Self-pay | Admitting: Cardiology

## 2011-01-04 ENCOUNTER — Ambulatory Visit (INDEPENDENT_AMBULATORY_CARE_PROVIDER_SITE_OTHER): Payer: Medicare Other | Admitting: *Deleted

## 2011-01-04 ENCOUNTER — Other Ambulatory Visit: Payer: Self-pay | Admitting: *Deleted

## 2011-01-04 ENCOUNTER — Telehealth: Payer: Self-pay | Admitting: Family Medicine

## 2011-01-04 ENCOUNTER — Ambulatory Visit (INDEPENDENT_AMBULATORY_CARE_PROVIDER_SITE_OTHER): Payer: Medicare Other | Admitting: Cardiology

## 2011-01-04 ENCOUNTER — Encounter: Payer: Self-pay | Admitting: Cardiology

## 2011-01-04 DIAGNOSIS — I5022 Chronic systolic (congestive) heart failure: Secondary | ICD-10-CM

## 2011-01-04 DIAGNOSIS — I4891 Unspecified atrial fibrillation: Secondary | ICD-10-CM

## 2011-01-04 DIAGNOSIS — I2510 Atherosclerotic heart disease of native coronary artery without angina pectoris: Secondary | ICD-10-CM

## 2011-01-04 DIAGNOSIS — Z7901 Long term (current) use of anticoagulants: Secondary | ICD-10-CM

## 2011-01-04 DIAGNOSIS — I4729 Other ventricular tachycardia: Secondary | ICD-10-CM

## 2011-01-04 DIAGNOSIS — Z79899 Other long term (current) drug therapy: Secondary | ICD-10-CM

## 2011-01-04 DIAGNOSIS — I472 Ventricular tachycardia: Secondary | ICD-10-CM

## 2011-01-04 LAB — HEPATIC FUNCTION PANEL
AST: 50 U/L — ABNORMAL HIGH (ref 0–37)
Albumin: 3.8 g/dL (ref 3.5–5.2)
Alkaline Phosphatase: 61 U/L (ref 39–117)
Total Protein: 6.4 g/dL (ref 6.0–8.3)

## 2011-01-04 LAB — BASIC METABOLIC PANEL
BUN: 17 mg/dL (ref 6–23)
Chloride: 106 mEq/L (ref 96–112)
Creatinine, Ser: 1.1 mg/dL (ref 0.4–1.5)

## 2011-01-04 LAB — POCT INR: INR: 2.2

## 2011-01-04 MED ORDER — CARVEDILOL 25 MG PO TABS: 25.0000 mg | ORAL_TABLET | Freq: Two times a day (BID) | ORAL | Status: DC

## 2011-01-04 MED ORDER — EPLERENONE 25 MG PO TABS: 25.0000 mg | ORAL_TABLET | Freq: Every day | ORAL | Status: DC

## 2011-01-04 MED ORDER — VALSARTAN 80 MG PO TABS: 80.0000 mg | ORAL_TABLET | Freq: Every day | ORAL | Status: DC

## 2011-01-04 MED ORDER — FUROSEMIDE 40 MG PO TABS: 40.0000 mg | ORAL_TABLET | Freq: Every day | ORAL | Status: DC

## 2011-01-04 MED ORDER — AMIODARONE HCL 200 MG PO TABS: 200.0000 mg | ORAL_TABLET | Freq: Every day | ORAL | Status: DC

## 2011-01-04 MED ORDER — WARFARIN SODIUM 5 MG PO TABS: 5.0000 mg | ORAL_TABLET | Freq: Every day | ORAL | Status: DC

## 2011-01-04 NOTE — Telephone Encounter (Signed)
I have attempted to contact this patient by phone with the following results: left message to return my call on answering machine (mobile).

## 2011-01-04 NOTE — Assessment & Plan Note (Signed)
Continue amiodarone and mexiletine. Check TSH and liver functions.

## 2011-01-04 NOTE — Assessment & Plan Note (Signed)
Euvolemic on examination. Continue present dose of diuretics. Check potassium and renal function.

## 2011-01-04 NOTE — Progress Notes (Signed)
HPI:[name] is a pleasant gentleman with a history of coronary artery disease, mixed ischemic and nonischemic cardiomyopathy, history of ICD, paroxysmal atrial fibrillation, and ventricular tachycardia. Previous carotid Dopplers in April 2006 showed 0-39% stenosis bilaterally. Previous abdominal ultrasound in April 2006 showed no aneurysm. Last cardiac catheterization in October 2008 showed an occluded right coronary artery with left-to-right collateralization. Ejection fraction is 20%. There was no other coronary disease noted. Last Myoview in Dec 2011 showed prior inferior infarct but no ischemia. His ejection fraction was 19%. ABIs in December of 2011 were normal. An echocardiogram in March of 2012 showed an ejection fraction of 15-20%, possible thrombus, mild MR and reduced RV function. The patient has had upgrade of his device to CRT-D. I last saw him in August of 2012. Since then, the patient denies any dyspnea on exertion, orthopnea, PND, pedal edema, palpitations, syncope or chest pain.   Current Outpatient Prescriptions  Medication Sig Dispense Refill  . amiodarone (PACERONE) 200 MG tablet Take 1 tablet (200 mg total) by mouth daily.  90 tablet  6  . carvedilol (COREG) 25 MG tablet TAKE 1 AND 1/2 TABLETS BY MOUTH TWICE DAILY  90 tablet  0  . eplerenone (INSPRA) 25 MG tablet Take 1 tablet (25 mg total) by mouth daily.  30 tablet  6  . fluticasone (FLONASE) 50 MCG/ACT nasal spray Place 2 sprays into the nose daily.  16 g  12  . furosemide (LASIX) 40 MG tablet Take 1 tablet (40 mg total) by mouth daily.      [name] mexiletine (MEXITIL) 150 MG capsule Take 2 capsules (300 mg total) by mouth 2 (two) times daily.  120 capsule  11  . MULTIPLE VITAMINS PO Take 1 capsule by mouth daily.        . Multiple Vitamins-Minerals (PRESERVISION AREDS 2 PO) Take 1 tablet by mouth daily.        . nitroGLYCERIN (NITROSTAT) 0.4 MG SL tablet Place 1 tablet (0.4 mg total) under the tongue every 5 (five) minutes as  needed.  25 tablet  2  . Omega-3 Fatty Acids (FISH OIL) 1000 MG CAPS Take 2 capsules by mouth daily.        . saw palmetto 500 MG capsule Take 500 mg by mouth daily.        . valsartan (DIOVAN) 80 MG tablet Take 80 mg by mouth daily.        [name] warfarin (COUMADIN) 5 MG tablet TAKE AS DIRECTED BY ANTICOAGULATION CLINIC  180 tablet  1     Past Medical History  Diagnosis Date  . Atrial fibrillation     Ablation for a-flutter 05/2000  . Cardiomyopathy     Ischemic.  EF 20%  . Hypertension   . Hyperlipidemia   . Allergy     rhinitis  . Obstructive sleep apnea     CPAP (Dr. Gwenette Greet)  . CAD (coronary artery disease)     PTCA RCA 1997 (Inf/post MI)  . Ventricular tachycardia     ICD, pacer  . Adenomatous colon polyp     Colonoscopy 07/2007---Dr. Watt Climes, [facility] (Rpt 5 yrs recommended)  . Kidney stones     No distal stones on CT 2010  . Myocardial infarction   . Microscopic hematuria   . Diverticulosis of colon 2005    Noted on Colonoscopies in 2005 and 2009 and on CT 2010  . Internal hemorrhoids     Colonoscopy 07/2007  . Hepatic steatosis 2010  Noted on CT abd 2010 and on u/s abd 04/2009 (transaminasemia)  . Allergic rhinitis   . BPH (benign prostatic hypertrophy)     PSA 04/15/07 was 1.44  . History of impaired glucose tolerance   . Chronic bronchitis     by CXR     Past Surgical History  Procedure Date  . [place] Contak U575- Virl Axe, MD 07/25/06 (second device)  . Breast surgery 1990    "Tissue removed from breast"  . Umbilical hernia repair   . Hernia repair 1975    bilateral inguinal  . Extracorporeal shock wave lithotripsy   . Atrial ablation surgery     History   Social History  . Marital Status: Married    Spouse Name: N/A    Number of Children: N/A  . Years of Education: N/A   Occupational History  . Retired    Social History Main Topics  . Smoking status: Former Smoker -- 2.0 packs/day for 15 years    Types: Cigarettes    Quit date:  01/15/1980  . Smokeless tobacco: Never Used  . Alcohol Use: Yes     socially  . Drug Use: No  . Sexually Active: Not on file   Other Topics Concern  . Not on file   Social History Narrative  . No narrative on file    ROS: ringing in ears but no fevers or chills, productive cough, hemoptysis, dysphasia, odynophagia, melena, hematochezia, dysuria, hematuria, rash, seizure activity, orthopnea, PND, pedal edema, claudication. Remaining systems are negative.  Physical Exam: Well-developed well-nourished in no acute distress.  Skin is warm and dry.  HEENT is normal.  Neck is supple. No thyromegaly.  Chest is clear to auscultation with normal expansion.  Cardiovascular exam is regular rate and rhythm.  Abdominal exam nontender or distended. No masses palpated. Extremities show no edema. neuro grossly intact

## 2011-01-04 NOTE — Assessment & Plan Note (Signed)
Not on statin secondary to previous increase in LFTs.

## 2011-01-04 NOTE — Assessment & Plan Note (Signed)
Blood pressure controlled. Continue present medications.

## 2011-01-04 NOTE — Assessment & Plan Note (Signed)
Continue amiodarone and Coumadin.

## 2011-01-04 NOTE — Telephone Encounter (Signed)
Pt states he has had flu shot at [facility] in October.  He would like appt to discuss memory.  This is made for 01/10/11.

## 2011-01-04 NOTE — Patient Instructions (Signed)
Your physician wants you to follow-up in: [place] will receive a reminder letter in the mail two months in advance. If you don't receive a letter, please call our office to schedule the follow-up appointment. Your physician recommends that you continue on your current medications as directed. Please refer to the Current Medication list given to you today. Your physician recommends that you return for lab work in: TODAY BMET LIVER AND TSH   DX V58.69

## 2011-01-04 NOTE — Telephone Encounter (Signed)
Pls call pt and tell him I recommend he come in for his flu vaccine if he has not already received it elsewhere this season.  Thx--PM

## 2011-01-04 NOTE — Assessment & Plan Note (Signed)
Continue present medications. 

## 2011-01-04 NOTE — Assessment & Plan Note (Addendum)
Not on statin as increased LFTs previously. Not on aspirin as he is on Coumadin.

## 2011-01-04 NOTE — Assessment & Plan Note (Signed)
Management per electrophysiology. 

## 2011-01-10 ENCOUNTER — Encounter: Payer: Self-pay | Admitting: Family Medicine

## 2011-01-10 ENCOUNTER — Ambulatory Visit (INDEPENDENT_AMBULATORY_CARE_PROVIDER_SITE_OTHER): Payer: Medicare Other | Admitting: Family Medicine

## 2011-01-10 DIAGNOSIS — H9190 Unspecified hearing loss, unspecified ear: Secondary | ICD-10-CM

## 2011-01-10 DIAGNOSIS — G3184 Mild cognitive impairment, so stated: Secondary | ICD-10-CM

## 2011-01-10 DIAGNOSIS — Z950 Presence of cardiac pacemaker: Secondary | ICD-10-CM

## 2011-01-10 DIAGNOSIS — H9319 Tinnitus, unspecified ear: Secondary | ICD-10-CM

## 2011-01-10 NOTE — Progress Notes (Signed)
OFFICE VISIT  01/13/2011   CC:  Chief Complaint  Patient presents with  . multiple issues-ringing in ears, memory     HPI:    Patient is a 70 y.o. Caucasian male who presents for memory problems and ringing in ears. Memory problems like sometimes can't think of a specific word, says a word opposite of what he is meaning, or going into a room sometimes and forgetting exactly why he went in there.  Gradual onset, got a little worse for a few months early on and then seemed to stabilize and hasn't progressed for years.  His ringing in the ears lately-- which is not new either, just worse in the last week than normal; says it has been pretty constant for a week or so, while it's usually a pattern of 10 min 1-2 times a month--made him think he needed to get things looked into.  He says he has some mild hearing loss that he only notices when in a crowded room with lots of background noise.  Says he was told by an audiologist at some point he had mild hearing loss from distant hx of noise damage. His memory/cognitive issues don't seem connected coincidentally with starting any particular medication.    He has no problems with ADLs, he still drives without any danger or tendency to get lost, still has long term recall of events, and for the most part has ability to still learn new things.  Recent routine cardiac MD follow up was a good report. Bone density testing ordered by [facility] recently was normal--he asked me to review the report today with him and I did.  Avg T score was -1.0: technically considered osteopenic.   Past Medical History  Diagnosis Date  . Atrial fibrillation     Ablation for a-flutter 05/2000  . Cardiomyopathy     Ischemic.  EF 20%  . Hypertension   . Hyperlipidemia   . Allergy     rhinitis  . Obstructive sleep apnea     CPAP (Dr. Gwenette Greet)  . CAD (coronary artery disease)     PTCA RCA 1997 (Inf/post MI)  . Ventricular tachycardia     ICD, pacer  .  Adenomatous colon polyp     Colonoscopy 07/2007---Dr. Watt Climes, [facility] (Rpt 5 yrs recommended)  . Kidney stones     No distal stones on CT 2010  . Myocardial infarction   . Microscopic hematuria   . Diverticulosis of colon 2005    Noted on Colonoscopies in 2005 and 2009 and on CT 2010  . Internal hemorrhoids     Colonoscopy 07/2007  . Hepatic steatosis 2010    Noted on CT abd 2010 and on u/s abd 04/2009 (transaminasemia)  . Allergic rhinitis   . BPH (benign prostatic hypertrophy)     PSA 04/15/07 was 1.44  . History of impaired glucose tolerance   . Chronic bronchitis     by CXR     Past Surgical History  Procedure Date  . [place] Contak H209- Virl Axe, MD 07/25/06 (second device)  . Breast surgery 1990    "Tissue removed from breast"  . Umbilical hernia repair   . Hernia repair 1975    bilateral inguinal  . Extracorporeal shock wave lithotripsy   . Atrial ablation surgery     Outpatient Prescriptions Prior to Visit  Medication Sig Dispense Refill  . amiodarone (PACERONE) 200 MG tablet Take 1 tablet (200 mg total) by mouth daily.  90 tablet  3  . carvedilol (COREG) 25 MG tablet Take 1 tablet (25 mg total) by mouth 2 (two) times daily with a meal.  180 tablet  3  . eplerenone (INSPRA) 25 MG tablet Take 1 tablet (25 mg total) by mouth daily.  90 tablet  3  . fluticasone (FLONASE) 50 MCG/ACT nasal spray Place 2 sprays into the nose daily.  16 g  12  . furosemide (LASIX) 40 MG tablet Take 1 tablet (40 mg total) by mouth daily.  90 tablet  3  . mexiletine (MEXITIL) 150 MG capsule Take 2 capsules (300 mg total) by mouth 2 (two) times daily.  120 capsule  11  . MULTIPLE VITAMINS PO Take 1 capsule by mouth daily.        . Multiple Vitamins-Minerals (PRESERVISION AREDS 2 PO) Take 1 tablet by mouth daily.        . nitroGLYCERIN (NITROSTAT) 0.4 MG SL tablet Place 1 tablet (0.4 mg total) under the tongue every 5 (five) minutes as needed.  25 tablet  2  . Omega-3 Fatty Acids (FISH  OIL) 1000 MG CAPS Take 2 capsules by mouth daily.        . saw palmetto 500 MG capsule Take 500 mg by mouth daily.        . valsartan (DIOVAN) 80 MG tablet Take 1 tablet (80 mg total) by mouth daily.  90 tablet  3  . warfarin (COUMADIN) 5 MG tablet Take 1 tablet (5 mg total) by mouth daily.  180 tablet  1    Allergies  Allergen Reactions  . Sulfa Antibiotics Rash  . Amiodarone Other (See Comments)    Corneal deposits    ROS: mild chronic dizziness/feeling of disequilibrium, no vertigo, no nausea, no HAs, no focal/unilateral weakness or numbness, no dysphagia or dysarthria, no tremor, no falls.  PE: Blood pressure 111/74, pulse 60, temperature 97 F (36.1 C), temperature source Temporal, height 6' (1.829 m), weight 239 lb (108.41 kg). Gen: Alert, well appearing.  Patient is oriented to person, place, time, and situation. Pleasant affect, lucid thought and speech. Neuro: CN 2-12 intact bilaterally, strength 5/5 in proximal and distal upper extremities and lower extremities bilaterally.  No sensory deficits.  No tremor.  No disdiadochokinesis.  No ataxia.  Upper extremity and lower extremity DTRs symmetric.  No pronator drift. MMSE: his only deficit was in recall, in which he got 1 out of 3 words.  Hearing Screening   _0  _1  _2  _3  _4  _5  _6   Right ear: _7 Left ear: _8 LABS:  None today  IMPRESSION AND PLAN:  Mild cognitive impairment At this point would do brain imaging, and if negative will start trial of aricept 79m.  Ringing in ears I believe this is related to his self reported history of presbycusis (variability/worsening of an existing problem) and is not connected in any way to his mild cognitive impairment.     FOLLOW UP: No Follow-up on file.

## 2011-01-11 ENCOUNTER — Telehealth: Payer: Self-pay | Admitting: Internal Medicine

## 2011-01-11 DIAGNOSIS — Z79899 Other long term (current) drug therapy: Secondary | ICD-10-CM

## 2011-01-11 DIAGNOSIS — I4891 Unspecified atrial fibrillation: Secondary | ICD-10-CM

## 2011-01-11 NOTE — Telephone Encounter (Signed)
Pt had lab work done with dr Stanford Breed 12-21 and heather wanted him to come in in January to have labs done, didn't know if any more labs needed to be done, if so can get order for 1-16 same day as coumadin?

## 2011-01-11 NOTE — Telephone Encounter (Signed)
I spoke with the patient. He had LFT's on 01/04/11. I explained he did not need these repeated in January. I will call him in March and schedule him for repeat LFT's since Dr. Caryl Comes wants these checked every 3 months. He voices understanding.

## 2011-01-13 DIAGNOSIS — H9319 Tinnitus, unspecified ear: Secondary | ICD-10-CM | POA: Insufficient documentation

## 2011-01-13 DIAGNOSIS — G3184 Mild cognitive impairment, so stated: Secondary | ICD-10-CM | POA: Insufficient documentation

## 2011-01-13 NOTE — Assessment & Plan Note (Addendum)
I believe this is related to his self reported history of presbycusis (variability/worsening of an existing problem) and is not connected in any way to his mild cognitive impairment.

## 2011-01-13 NOTE — Assessment & Plan Note (Signed)
At this point would do brain imaging, and if negative will start trial of aricept 70m.

## 2011-01-14 ENCOUNTER — Ambulatory Visit (HOSPITAL_BASED_OUTPATIENT_CLINIC_OR_DEPARTMENT_OTHER)
Admission: RE | Admit: 2011-01-14 | Discharge: 2011-01-14 | Disposition: A | Discharge status: Home / Self Care | Payer: Medicare Other | Source: Ambulatory Visit | Attending: Family Medicine | Admitting: Family Medicine

## 2011-01-14 DIAGNOSIS — G3184 Mild cognitive impairment, so stated: Secondary | ICD-10-CM

## 2011-01-14 DIAGNOSIS — Z950 Presence of cardiac pacemaker: Secondary | ICD-10-CM

## 2011-01-14 DIAGNOSIS — H9319 Tinnitus, unspecified ear: Secondary | ICD-10-CM

## 2011-01-14 DIAGNOSIS — G319 Degenerative disease of nervous system, unspecified: Secondary | ICD-10-CM

## 2011-01-15 ENCOUNTER — Other Ambulatory Visit: Payer: Self-pay | Admitting: Family Medicine

## 2011-01-15 MED ORDER — DONEPEZIL HCL 5 MG PO TABS: 5.0000 mg | ORAL_TABLET | Freq: Every evening | ORAL | Status: DC | PRN

## 2011-01-17 ENCOUNTER — Encounter: Payer: Self-pay | Admitting: Internal Medicine

## 2011-01-17 ENCOUNTER — Ambulatory Visit (INDEPENDENT_AMBULATORY_CARE_PROVIDER_SITE_OTHER): Payer: Medicare Other | Admitting: *Deleted

## 2011-01-17 ENCOUNTER — Other Ambulatory Visit: Payer: Self-pay | Admitting: Internal Medicine

## 2011-01-17 DIAGNOSIS — I472 Ventricular tachycardia, unspecified: Secondary | ICD-10-CM

## 2011-01-17 DIAGNOSIS — I4729 Other ventricular tachycardia: Secondary | ICD-10-CM

## 2011-01-17 DIAGNOSIS — I4891 Unspecified atrial fibrillation: Secondary | ICD-10-CM

## 2011-01-18 ENCOUNTER — Telehealth: Payer: Self-pay | Admitting: Internal Medicine

## 2011-01-18 NOTE — Telephone Encounter (Signed)
Reported medication is generic for Aricept. I will forward to Dr. Caryl Comes for review and recommendations.

## 2011-01-18 NOTE — Telephone Encounter (Signed)
That should be fine. 

## 2011-01-18 NOTE — Telephone Encounter (Signed)
New Problem:    Patient called because his PCP Dr. Anitra Lauth wanted to place him on a new medication donepezil 45m for memory loss and he wanted to see if that was ok with Dr. KCaryl Comesfirst. Please advise.

## 2011-01-18 NOTE — Telephone Encounter (Signed)
The patient is aware of Dr. Olin Pia recommendations.

## 2011-01-19 LAB — REMOTE ICD DEVICE
ATRIAL PACING ICD: 99 pct
CHARGE TIME: 8.5 s
PACEART VT: 0
RV LEAD IMPEDENCE ICD: 695 Ohm
TOT-0006: 20121002000000
TZAT-0001FASTVT: 2
TZAT-0001SLOWVT: 2
TZAT-0013SLOWVT: 4
TZAT-0018FASTVT: NEGATIVE
TZAT-0018SLOWVT: NEGATIVE
TZON-0003SLOWVT: 600 ms
TZST-0001FASTVT: 4
TZST-0001FASTVT: 8
TZST-0001SLOWVT: 5
TZST-0001SLOWVT: 6
TZST-0001SLOWVT: 7
TZST-0002SLOWVT: NEGATIVE
TZST-0002SLOWVT: NEGATIVE
TZST-0003FASTVT: 41 J
TZST-0003FASTVT: 41 J
TZST-0003FASTVT: 41 J
VENTRICULAR PACING ICD: 100 pct

## 2011-01-23 ENCOUNTER — Ambulatory Visit (INDEPENDENT_AMBULATORY_CARE_PROVIDER_SITE_OTHER): Payer: Medicare Other | Admitting: Pulmonary Disease

## 2011-01-23 ENCOUNTER — Encounter: Payer: Self-pay | Admitting: Pulmonary Disease

## 2011-01-23 VITALS — BP 106/70 | HR 60 | Temp 97.8°F | Ht 71.0 in | Wt 243.2 lb

## 2011-01-23 DIAGNOSIS — G4733 Obstructive sleep apnea (adult) (pediatric): Secondary | ICD-10-CM

## 2011-01-23 NOTE — Patient Instructions (Signed)
Continue with cpap, and work on weight loss Cancel redundant apptms, and followup with me in one year.

## 2011-01-23 NOTE — Progress Notes (Signed)
  Subjective:    Patient ID: [name], male    DOB: 03-31-1940, 71 y.o.   MRN: [id number]  HPI The patient comes in today for followup of his known obstructive sleep apnea.  He is wearing CPAP compliantly, and feels that he is resting well with excellent daytime alertness.  He is doing much better since getting his new CPAP machine, and is having no issues at this time.   Review of Systems  Constitutional: Negative for fever and unexpected weight change.  HENT: Positive for sore throat, rhinorrhea and postnasal drip. Negative for ear pain, nosebleeds, congestion, sneezing, trouble swallowing, dental problem and sinus pressure.   Eyes: Negative for redness and itching.  Respiratory: Negative for cough, chest tightness, shortness of breath and wheezing.   Cardiovascular: Negative for palpitations and leg swelling.  Gastrointestinal: Negative for nausea and vomiting.  Genitourinary: Positive for dysuria.  Musculoskeletal: Negative for joint swelling.  Skin: Negative for rash.  Neurological: Negative for headaches.  Hematological: Does not bruise/bleed easily.  Psychiatric/Behavioral: Negative for dysphoric mood. The patient is not nervous/anxious.        Objective:   Physical Exam Well-developed male in no acute distress No skin breakdown or pressure necrosis from the CPAP mask Chest totally clear to auscultation Cardiac exam with regular rate and rhythm Lower extremities without significant edema, no cyanosis noted Alert and oriented, does not appear to be sleepy, moves all 4 extremities.       Assessment & Plan:

## 2011-01-23 NOTE — Assessment & Plan Note (Signed)
The patient is doing well with his positive pressure device currently.  He feels that he is sleeping well, has no issues with daytime sleepiness.  I have asked him to continue working on weight loss, and to keep up with mask changes and supplies.  He will followup with me in one year doing well.

## 2011-01-24 NOTE — Progress Notes (Signed)
Remote icd check  

## 2011-01-30 ENCOUNTER — Ambulatory Visit (INDEPENDENT_AMBULATORY_CARE_PROVIDER_SITE_OTHER): Payer: Medicare Other | Admitting: *Deleted

## 2011-01-30 DIAGNOSIS — Z7901 Long term (current) use of anticoagulants: Secondary | ICD-10-CM

## 2011-01-30 DIAGNOSIS — I4891 Unspecified atrial fibrillation: Secondary | ICD-10-CM | POA: Diagnosis not present

## 2011-01-30 LAB — POCT INR: INR: 2.8

## 2011-02-08 ENCOUNTER — Other Ambulatory Visit (INDEPENDENT_AMBULATORY_CARE_PROVIDER_SITE_OTHER): Payer: Medicare Other | Admitting: *Deleted

## 2011-02-08 DIAGNOSIS — Z79899 Other long term (current) drug therapy: Secondary | ICD-10-CM | POA: Diagnosis not present

## 2011-02-08 DIAGNOSIS — I4891 Unspecified atrial fibrillation: Secondary | ICD-10-CM | POA: Diagnosis not present

## 2011-02-08 LAB — HEPATIC FUNCTION PANEL
ALT: 54 U/L — ABNORMAL HIGH (ref 0–53)
Bilirubin, Direct: 0.1 mg/dL (ref 0.0–0.3)
Total Bilirubin: 0.8 mg/dL (ref 0.3–1.2)

## 2011-02-19 ENCOUNTER — Ambulatory Visit (INDEPENDENT_AMBULATORY_CARE_PROVIDER_SITE_OTHER): Payer: Medicare Other | Admitting: Family Medicine

## 2011-02-19 ENCOUNTER — Encounter: Payer: Self-pay | Admitting: Family Medicine

## 2011-02-19 DIAGNOSIS — H9319 Tinnitus, unspecified ear: Secondary | ICD-10-CM

## 2011-02-19 DIAGNOSIS — R7401 Elevation of levels of liver transaminase levels: Secondary | ICD-10-CM

## 2011-02-19 DIAGNOSIS — G3184 Mild cognitive impairment, so stated: Secondary | ICD-10-CM | POA: Diagnosis not present

## 2011-02-19 MED ORDER — DONEPEZIL HCL 10 MG PO TABS: 10.0000 mg | ORAL_TABLET | Freq: Every evening | ORAL | Status: DC | PRN

## 2011-02-19 NOTE — Progress Notes (Signed)
OFFICE NOTE  02/21/2011  CC:  Chief Complaint  Patient presents with  . Follow-up    memory, meds (aricept)     HPI: Patient is a 71 y.o. Caucasian male who is here for 1 mo f/u for cognitive impairment that we started him on aricept 67m for.  His head CT w/out contrast 12/2010 was remarkable only for mild diffuse cerebral atrophy. Describes having some "crazy" dreams on aricept but these seem to be resolving some. Says he stopped zyrtec recently b/c of pharmacist's warning that it may make cognitive impairment worse. He then started saline nasal spray and this seems sufficient for his rhinitis sx's. Pertinent PMH:  Past Medical History  Diagnosis Date  . Atrial fibrillation     Ablation for a-flutter 05/2000  . Cardiomyopathy     Ischemic.  EF 20%  . Hypertension   . Hyperlipidemia   . Allergy     rhinitis  . Obstructive sleep apnea     CPAP 16  . CAD (coronary artery disease)     PTCA RCA 1997 (Inf/post MI)  . Ventricular tachycardia     ICD, pacer  . Adenomatous colon polyp     Colonoscopy 07/2007---Dr. MWatt Climes [facility] (Rpt 5 yrs recommended)  . Kidney stones     No distal stones on CT 2010  . Myocardial infarction   . Microscopic hematuria   . Diverticulosis of colon 2005    Noted on Colonoscopies in 2005 and 2009 and on CT 2010  . Internal hemorrhoids     Colonoscopy 07/2007  . Hepatic steatosis 2010    Noted on CT abd 2010 and on u/s abd 04/2009 (transaminasemia)  . Allergic rhinitis   . BPH (benign prostatic hypertrophy)     PSA 04/15/07 was 1.44  . History of impaired glucose tolerance   . Chronic bronchitis     by CXR    Past surgical, social, and family history reviewed and no changes noted since last office visit.  MEDS:  Outpatient Prescriptions Prior to Visit  Medication Sig Dispense Refill  . amiodarone (PACERONE) 200 MG tablet Take 1 tablet (200 mg total) by mouth daily.  90 tablet  3  . carvedilol (COREG) 25 MG tablet Take 1 tablet (25 mg total) by  mouth 2 (two) times daily with a meal.  180 tablet  3  . eplerenone (INSPRA) 25 MG tablet Take 1 tablet (25 mg total) by mouth daily.  90 tablet  3  . furosemide (LASIX) 40 MG tablet Take 1 tablet (40 mg total) by mouth daily.  90 tablet  3  . mexiletine (MEXITIL) 150 MG capsule Take 2 capsules (300 mg total) by mouth 2 (two) times daily.  120 capsule  11  . MULTIPLE VITAMINS PO Take 1 capsule by mouth daily.        . Multiple Vitamins-Minerals (PRESERVISION AREDS 2 PO) Take 1 tablet by mouth daily.        . nitroGLYCERIN (NITROSTAT) 0.4 MG SL tablet Place 1 tablet (0.4 mg total) under the tongue every 5 (five) minutes as needed.  25 tablet  2  . Omega-3 Fatty Acids (FISH OIL) 1000 MG CAPS Take 2 capsules by mouth daily.        . saw palmetto 500 MG capsule Take 500 mg by mouth daily.        . valsartan (DIOVAN) 80 MG tablet Take 40 mg by mouth daily.      .Marland Kitchenwarfarin (COUMADIN) 5 MG tablet Take  1 tablet (5 mg total) by mouth daily.  180 tablet  1  . donepezil (ARICEPT) 5 MG tablet Take 1 tablet (5 mg total) by mouth at bedtime as needed.  30 tablet  1    PE: Blood pressure 109/74, pulse 60, height 5' 11" (1.803 m), weight 241 lb (109.317 kg). Gen: Alert, well appearing.  Patient is oriented to person, place, time, and situation. MMSE: no deficits except he missed 1 out of 3 on recall.  IMPRESSION AND PLAN:  Mild cognitive impairment Minimal, if any, improvement on 46m aricept x 1 mo.  He is certainly no worse, and he has no side effects. Will increase to aricept 152mqd.  Ringing in ears He'll make appt with his ENT, Dr. WoErik Obeyfor further eval of his hearing loss and tinnitus.      FOLLOW UP: 1 mo

## 2011-02-21 NOTE — Assessment & Plan Note (Signed)
He'll make appt with his ENT, Dr. Erik Obey, for further eval of his hearing loss and tinnitus.

## 2011-02-21 NOTE — Assessment & Plan Note (Signed)
Minimal, if any, improvement on 6m aricept x 1 mo.  He is certainly no worse, and he has no side effects. Will increase to aricept 139mqd.

## 2011-02-27 ENCOUNTER — Ambulatory Visit (INDEPENDENT_AMBULATORY_CARE_PROVIDER_SITE_OTHER): Payer: Medicare Other | Admitting: *Deleted

## 2011-02-27 DIAGNOSIS — Z7901 Long term (current) use of anticoagulants: Secondary | ICD-10-CM

## 2011-02-27 DIAGNOSIS — I4891 Unspecified atrial fibrillation: Secondary | ICD-10-CM

## 2011-02-27 LAB — POCT INR: INR: 3.2

## 2011-03-15 ENCOUNTER — Other Ambulatory Visit: Payer: Medicare Other

## 2011-03-19 ENCOUNTER — Ambulatory Visit: Payer: Medicare Other | Admitting: Family Medicine

## 2011-03-20 ENCOUNTER — Encounter: Payer: Self-pay | Admitting: Family Medicine

## 2011-03-20 ENCOUNTER — Other Ambulatory Visit: Payer: Medicare Other

## 2011-03-20 ENCOUNTER — Ambulatory Visit: Payer: Medicare Other | Admitting: Family Medicine

## 2011-03-20 ENCOUNTER — Ambulatory Visit (INDEPENDENT_AMBULATORY_CARE_PROVIDER_SITE_OTHER): Payer: Medicare Other | Admitting: Family Medicine

## 2011-03-20 VITALS — BP 114/77 | HR 60 | Temp 97.0°F | Wt 242.0 lb

## 2011-03-20 DIAGNOSIS — R7989 Other specified abnormal findings of blood chemistry: Secondary | ICD-10-CM | POA: Diagnosis not present

## 2011-03-20 DIAGNOSIS — J209 Acute bronchitis, unspecified: Secondary | ICD-10-CM

## 2011-03-20 LAB — ALT: ALT: 73 U/L — ABNORMAL HIGH (ref 0–53)

## 2011-03-20 LAB — AST: AST: 55 U/L — ABNORMAL HIGH (ref 0–37)

## 2011-03-20 MED ORDER — ALBUTEROL SULFATE HFA 108 (90 BASE) MCG/ACT IN AERS: 2.0000 | INHALATION_SPRAY | Freq: Four times a day (QID) | RESPIRATORY_TRACT | Status: DC | PRN

## 2011-03-20 MED ORDER — PREDNISONE 20 MG PO TABS: ORAL_TABLET | ORAL | Status: DC

## 2011-03-20 NOTE — Progress Notes (Signed)
OFFICE NOTE  03/20/2011  CC:  Chief Complaint  Patient presents with  . URI    productive cough, ST, runny nose     HPI: Patient is a 71 y.o. Caucasian male who is here for URI sx's and  (lab visit turned into a walk-in acute illness visit). Pt presents complaining of respiratory symptoms for 2 weeks.  Mostly nasal congestion/runny nose, sneezing, and PND cough.  Raspy throat.  Worst symptoms seems to be the cough--green, thick and sticky.  Lately the symptoms seem to be worsening. No fever.  Hacking/wheezing intermittently.  No pain in face or teeth.  No significant HA.  Symptoms made worse by cool air.  Symptoms improved by gargling with warm salt water--minimally. Repeatedly remarks "otherwise I feel great". Smoker? Former (quit 40 yrs ago). Recent sick contact? no Muscle or joint aches? no Flu shot this season at least 2 wks ago? yes  ROS: no n/v/d or abdominal pain.  No rash.  No neck stiffness.   +Mild fatigue.  +Mild appetite loss.   Pertinent PMH:  Past Medical History  Diagnosis Date  . Atrial fibrillation     Ablation for a-flutter 05/2000  . Cardiomyopathy     Ischemic.  EF 20%  . Hypertension   . Hyperlipidemia   . Allergy     rhinitis  . Obstructive sleep apnea     CPAP 16  . CAD (coronary artery disease)     PTCA RCA 1997 (Inf/post MI)  . Ventricular tachycardia     ICD, pacer  . Adenomatous colon polyp     Colonoscopy 07/2007---Dr. Watt Climes, [facility] (Rpt 5 yrs recommended)  . Kidney stones     No distal stones on CT 2010  . Myocardial infarction   . Microscopic hematuria   . Diverticulosis of colon 2005    Noted on Colonoscopies in 2005 and 2009 and on CT 2010  . Internal hemorrhoids     Colonoscopy 07/2007  . Hepatic steatosis 2010    Noted on CT abd 2010 and on u/s abd 04/2009 (transaminasemia)  . Allergic rhinitis   . BPH (benign prostatic hypertrophy)     PSA 04/15/07 was 1.44  . History of impaired glucose tolerance   . Chronic bronchitis     by  CXR    Past surgical, social, and family history reviewed and no changes noted since last office visit.  MEDS:  Outpatient Prescriptions Prior to Visit  Medication Sig Dispense Refill  . amiodarone (PACERONE) 200 MG tablet Take 1 tablet (200 mg total) by mouth daily.  90 tablet  3  . carvedilol (COREG) 25 MG tablet Take 1 tablet (25 mg total) by mouth 2 (two) times daily with a meal.  180 tablet  3  . donepezil (ARICEPT) 10 MG tablet Take 1 tablet (10 mg total) by mouth at bedtime as needed.  30 tablet  5  . eplerenone (INSPRA) 25 MG tablet Take 1 tablet (25 mg total) by mouth daily.  90 tablet  3  . furosemide (LASIX) 40 MG tablet Take 1 tablet (40 mg total) by mouth daily.  90 tablet  3  . mexiletine (MEXITIL) 150 MG capsule Take 2 capsules (300 mg total) by mouth 2 (two) times daily.  120 capsule  11  . MULTIPLE VITAMINS PO Take 1 capsule by mouth daily.        . Multiple Vitamins-Minerals (PRESERVISION AREDS 2 PO) Take 1 tablet by mouth daily.        [name]  nitroGLYCERIN (NITROSTAT) 0.4 MG SL tablet Place 1 tablet (0.4 mg total) under the tongue every 5 (five) minutes as needed.  25 tablet  2  . Omega-3 Fatty Acids (FISH OIL) 1000 MG CAPS Take 2 capsules by mouth daily.        . saw palmetto 500 MG capsule Take 500 mg by mouth daily.        . valsartan (DIOVAN) 80 MG tablet Take 40 mg by mouth daily.      [name] warfarin (COUMADIN) 5 MG tablet Take 1 tablet (5 mg total) by mouth daily.  180 tablet  1    PE: Blood pressure 114/77, pulse 60, temperature 97 F (36.1 C), temperature source Temporal, weight 242 lb (109.77 kg), SpO2 96.00%. VS: noted--normal. Gen: alert, NAD, well- APPEARING. HEENT: eyes without injection, drainage, or swelling.  Ears: EACs clear, TMs with normal light reflex and landmarks.  Nose: Clear passages, mucosa pink.  No purulent d/c.  No paranasal sinus TTP.  No facial swelling.  Throat and mouth without focal lesion.  No pharyngial swelling, erythema, or exudate.   Neck:  supple, no LAD.   LUNGS: Inspiration is clear.  He has diffuse exp wheezing with decent aeration and mildly prolonged exp phase.  Nonlabored resps.   CV: RRR, distant S1 and S2, 2/6 systolic murmur EXT: no c/c/e SKIN: no rash  LABS: none today  IMPRESSION AND PLAN: Acute bronchitis Suspect viral etiology. Prednisone 41m qd x 5d, then 248mqd x 5d. Ventolin HFA 1-2 puffs q4h prn. Mucinex DM bid prn.      FOLLOW UP: has appt already scheduled next week

## 2011-03-20 NOTE — Assessment & Plan Note (Signed)
Suspect viral etiology. Prednisone 68m qd x 5d, then 288mqd x 5d. Ventolin HFA 1-2 puffs q4h prn. Mucinex DM bid prn.

## 2011-03-20 NOTE — Patient Instructions (Signed)
Buy Mucinex DM (OTC) and take twice per day as needed for cough.

## 2011-03-21 ENCOUNTER — Ambulatory Visit (INDEPENDENT_AMBULATORY_CARE_PROVIDER_SITE_OTHER): Payer: Medicare Other | Admitting: *Deleted

## 2011-03-21 DIAGNOSIS — I4891 Unspecified atrial fibrillation: Secondary | ICD-10-CM

## 2011-03-21 DIAGNOSIS — Z7901 Long term (current) use of anticoagulants: Secondary | ICD-10-CM | POA: Diagnosis not present

## 2011-03-21 LAB — POCT INR: INR: 3

## 2011-03-25 ENCOUNTER — Ambulatory Visit: Payer: Medicare Other | Admitting: Family Medicine

## 2011-03-26 ENCOUNTER — Encounter: Payer: Self-pay | Admitting: Family Medicine

## 2011-03-26 ENCOUNTER — Ambulatory Visit (INDEPENDENT_AMBULATORY_CARE_PROVIDER_SITE_OTHER): Payer: Medicare Other | Admitting: Family Medicine

## 2011-03-26 VITALS — BP 110/74 | HR 60 | Ht 71.0 in | Wt 238.0 lb

## 2011-03-26 DIAGNOSIS — E785 Hyperlipidemia, unspecified: Secondary | ICD-10-CM

## 2011-03-26 DIAGNOSIS — G3184 Mild cognitive impairment, so stated: Secondary | ICD-10-CM | POA: Diagnosis not present

## 2011-03-26 DIAGNOSIS — R7401 Elevation of levels of liver transaminase levels: Secondary | ICD-10-CM | POA: Diagnosis not present

## 2011-03-26 DIAGNOSIS — J209 Acute bronchitis, unspecified: Secondary | ICD-10-CM | POA: Diagnosis not present

## 2011-03-26 NOTE — Progress Notes (Signed)
OFFICE NOTE  03/28/2011  CC:  Chief Complaint  Patient presents with  . Follow-up    cognitive impairment     HPI: Patient is a 71 y.o. Caucasian male who is here for 6 wk f/u mild cognitive impairment.  We titrated his aricept up to 66m last visit.  Says he thinks his recall/memory is a bit improved since getting on the 159mdose.  No side effects. Also, 6d/a we started a steroid 10d taper and ventolin and mucinex dm for acute bronchitis and this is the main thing he wanted to f/u about today.  Cough, chest tightness, wheezing all much improved.  No fevers.     Pertinent PMH:  Past Medical History  Diagnosis Date  . Atrial fibrillation     Ablation for a-flutter 05/2000  . Cardiomyopathy     Ischemic.  EF 20%  . Hypertension   . Hyperlipidemia   . Allergy     rhinitis  . Obstructive sleep apnea     CPAP 16  . CAD (coronary artery disease)     PTCA RCA 1997 (Inf/post MI)  . Ventricular tachycardia     ICD, pacer  . Adenomatous colon polyp     [facility]  . Kidney stones     No distal stones on CT 2010  . Myocardial infarction   . Microscopic hematuria   . Diverticulosis of colon 2005    Noted on Colonoscopies in 2005 and 2009 and on CT 2010  . Internal hemorrhoids     Colonoscopy 07/2007  . Hepatic steatosis 2010    Noted on CT abd 2010 and on u/s abd 04/2009 (transaminasemia)  . Allergic rhinitis   . BPH (benign prostatic hypertrophy)     PSA 04/15/07 was 1.44  . History of impaired glucose tolerance   . Chronic bronchitis     by CXR     MEDS:  Outpatient Prescriptions Prior to Visit  Medication Sig Dispense Refill  . albuterol (VENTOLIN HFA) 108 (90 BASE) MCG/ACT inhaler Inhale 2 puffs into the lungs every 6 (six) hours as needed for wheezing.  1 Inhaler  1  . amiodarone (PACERONE) 200 MG tablet Take 1 tablet (200 mg total) by mouth daily.  90 tablet  3  . carvedilol (COREG) 25 MG tablet Take 1 tablet (25 mg  total) by mouth 2 (two) times daily with a meal.  180 tablet  3  . donepezil (ARICEPT) 10 MG tablet Take 1 tablet (10 mg total) by mouth at bedtime as needed.  30 tablet  5  . eplerenone (INSPRA) 25 MG tablet Take 1 tablet (25 mg total) by mouth daily.  90 tablet  3  . furosemide (LASIX) 40 MG tablet Take 1 tablet (40 mg total) by mouth daily.  90 tablet  3  . mexiletine (MEXITIL) 150 MG capsule Take 2 capsules (300 mg total) by mouth 2 (two) times daily.  120 capsule  11  . MULTIPLE VITAMINS PO Take 1 capsule by mouth daily.        . Multiple Vitamins-Minerals (PRESERVISION AREDS 2 PO) Take 1 tablet by mouth daily.        . nitroGLYCERIN (NITROSTAT) 0.4 MG SL tablet Place 1 tablet (0.4 mg total) under the tongue every 5 (five) minutes as needed.  25 tablet  2  . Omega-3 Fatty Acids (FISH OIL) 1000 MG CAPS Take 2 capsules by mouth daily.        .[name]  predniSONE (DELTASONE) 20 MG tablet 2 tabs po qd x 5d, then 1 tab po qd x 5d  15 tablet  0  . saw palmetto 500 MG capsule Take 500 mg by mouth daily.        . valsartan (DIOVAN) 80 MG tablet Take 40 mg by mouth daily.      [name] warfarin (COUMADIN) 5 MG tablet Take 1 tablet (5 mg total) by mouth daily.  180 tablet  1    PE: Blood pressure 110/74, pulse 60, height 5' 11" (1.803 m), weight 238 lb (107.956 kg). Gen: Alert, well appearing.  Patient is oriented to person, place, time, and situation. ENT: Ears: EACs clear, normal epithelium.  TMs with good light reflex and landmarks bilaterally.  Eyes: no injection, icteris, swelling, or exudate.  EOMI, PERRLA. Nose: no drainage or turbinate edema/swelling.  No injection or focal lesion.  Mouth: lips without lesion/swelling.  Oral mucosa pink and moist.  Dentition intact and without obvious caries or gingival swelling.  Oropharynx without erythema, exudate, or swelling.  Neck - No masses or thyromegaly or limitation in range of motion CV: RRR LUNGS: CTA bilat, nonlabored resps, good aeration in all lung  fields.  LABS: none today. RECENT LABS:  Lab Results  Component Value Date   ALT 73* 03/20/2011   AST 55* 03/20/2011   ALKPHOS 59 02/08/2011   BILITOT 0.8 02/08/2011    IMPRESSION AND PLAN:  Acute bronchitis Improving appropriately. Finish prednisone taper.  Mild cognitive impairment Mild improvement per pt report today. Continue aricept 73m qd.  Elevated transaminase level--on amiodarone Fatty liver as well. Transaminases are stable.   HYPERLIPIDEMIA Statin intolerance in the past, so I think with his mild NASH we'll hold off from any cholesterol-lowering med at this time. He is in agreement with this approach.       FOLLOW UP: 2 mo f/u mild cognitive impairment/aricept use

## 2011-03-27 ENCOUNTER — Telehealth: Payer: Self-pay | Admitting: Family Medicine

## 2011-03-27 NOTE — Telephone Encounter (Signed)
Pls notify pt that I researched his question about the Procera AVH pill for memory. Bottom line is that it has "scam" written all over it--no evidence that it has been studied in a way that has any scientific validity. In addition to that, the ingredients may be particularly dangerous or have unpredictable effects on him in particular b/c of his medical problems and medications: the acetyl-l-carnitine and vinpocetine can give problems with coagulation/blood clotting, and the huperzine can have unpredictable blood pressure and heart rate effects.   So, I do not recommend he buy/take any of this "nutritional supplement".  --thx

## 2011-03-28 NOTE — Assessment & Plan Note (Signed)
Statin intolerance in the past, so I think with his mild NASH we'll hold off from any cholesterol-lowering med at this time. He is in agreement with this approach.

## 2011-03-28 NOTE — Telephone Encounter (Signed)
I have attempted to contact this patient by phone with the following results: no answer after 8 rings.

## 2011-03-28 NOTE — Assessment & Plan Note (Signed)
Improving appropriately. Finish prednisone taper.

## 2011-03-28 NOTE — Assessment & Plan Note (Signed)
Mild improvement per pt report today. Continue aricept 40m qd.

## 2011-03-28 NOTE — Assessment & Plan Note (Addendum)
Fatty liver as well. Transaminases are stable.

## 2011-03-29 NOTE — Telephone Encounter (Signed)
Advised pt of advice below.  He is thankful for information.

## 2011-04-04 ENCOUNTER — Ambulatory Visit (INDEPENDENT_AMBULATORY_CARE_PROVIDER_SITE_OTHER): Payer: Medicare Other | Admitting: *Deleted

## 2011-04-04 DIAGNOSIS — I4891 Unspecified atrial fibrillation: Secondary | ICD-10-CM

## 2011-04-04 DIAGNOSIS — Z7901 Long term (current) use of anticoagulants: Secondary | ICD-10-CM | POA: Diagnosis not present

## 2011-04-04 DIAGNOSIS — E785 Hyperlipidemia, unspecified: Secondary | ICD-10-CM

## 2011-04-04 DIAGNOSIS — I2589 Other forms of chronic ischemic heart disease: Secondary | ICD-10-CM

## 2011-04-04 DIAGNOSIS — I10 Essential (primary) hypertension: Secondary | ICD-10-CM

## 2011-04-04 LAB — HEPATIC FUNCTION PANEL
ALT: 47 U/L (ref 0–53)
AST: 33 U/L (ref 0–37)
Total Bilirubin: 0.9 mg/dL (ref 0.3–1.2)

## 2011-04-18 ENCOUNTER — Ambulatory Visit (INDEPENDENT_AMBULATORY_CARE_PROVIDER_SITE_OTHER): Payer: Medicare Other | Admitting: *Deleted

## 2011-04-18 ENCOUNTER — Encounter: Payer: Self-pay | Admitting: Internal Medicine

## 2011-04-18 DIAGNOSIS — I4891 Unspecified atrial fibrillation: Secondary | ICD-10-CM | POA: Diagnosis not present

## 2011-04-18 DIAGNOSIS — I472 Ventricular tachycardia, unspecified: Secondary | ICD-10-CM

## 2011-04-18 DIAGNOSIS — I4729 Other ventricular tachycardia: Secondary | ICD-10-CM | POA: Diagnosis not present

## 2011-04-19 ENCOUNTER — Ambulatory Visit (INDEPENDENT_AMBULATORY_CARE_PROVIDER_SITE_OTHER): Payer: Medicare Other | Admitting: Family Medicine

## 2011-04-19 ENCOUNTER — Encounter: Payer: Self-pay | Admitting: Family Medicine

## 2011-04-19 VITALS — BP 113/77 | HR 60 | Temp 97.4°F | Ht 71.0 in | Wt 237.0 lb

## 2011-04-19 DIAGNOSIS — J019 Acute sinusitis, unspecified: Secondary | ICD-10-CM | POA: Diagnosis not present

## 2011-04-19 MED ORDER — LEVOCETIRIZINE DIHYDROCHLORIDE 5 MG PO TABS: 5.0000 mg | ORAL_TABLET | Freq: Every evening | ORAL | Status: DC

## 2011-04-19 MED ORDER — CEPHALEXIN 500 MG PO CAPS: 500.0000 mg | ORAL_CAPSULE | Freq: Three times a day (TID) | ORAL | Status: AC

## 2011-04-19 NOTE — Assessment & Plan Note (Signed)
Keflex 547m tid x 10d. Xyzal 556mqd. Salt water gargle and chloraseptic spray encouraged.

## 2011-04-19 NOTE — Progress Notes (Signed)
OFFICE NOTE  04/19/2011  CC:  Chief Complaint  Patient presents with  . Sore Throat    cough, post nasal drip     HPI: Patient is a 71 y.o. Caucasian male who is here for cough/ST. Pt presents complaining of respiratory symptoms for 10  days.  Mostly nasal congestion/runny nose, sneezing, and PND cough.  Worst symptoms seems to be the ST.  Lately the symptoms seem to be worsening.  Albuterol helping dry cough, uses it 1-2 times per day lately. No fevers, no wheezing, and no SOB.  No pain in face or teeth.  No significant HA.    Symptoms made worse by activity, night time.  Symptoms improved by nothing. Smoker? former Recent sick contact? unknown Muscle or joint aches? no Flu shot this season at least 2 wks ago? yes  ROS: no n/v/d or abdominal pain.  No rash.  No neck stiffness.   +Mild fatigue.  +Mild appetite loss.   He stopped aricept due to lots of nightmares and insomnia.  These sx's have improved off of aricept. Feels like memory still ok.  Pertinent PMH:  Past Medical History  Diagnosis Date  . Atrial fibrillation     Ablation for a-flutter 05/2000  . Cardiomyopathy     Ischemic.  EF 20%  . Hypertension   . Hyperlipidemia   . Allergy     rhinitis  . Obstructive sleep apnea     CPAP 16  . CAD (coronary artery disease)     PTCA RCA 1997 (Inf/post MI)  . Ventricular tachycardia     ICD, pacer  . Adenomatous colon polyp     Colonoscopy 07/2007---Dr. Watt Climes, [facility] (Rpt 5 yrs recommended)  . Kidney stones     No distal stones on CT 2010  . Myocardial infarction   . Microscopic hematuria   . Diverticulosis of colon 2005    Noted on Colonoscopies in 2005 and 2009 and on CT 2010  . Internal hemorrhoids     Colonoscopy 07/2007  . Hepatic steatosis 2010    Noted on CT abd 2010 and on u/s abd 04/2009 (transaminasemia)  . Allergic rhinitis   . BPH (benign prostatic hypertrophy)     PSA 04/15/07 was 1.44  . History of impaired glucose tolerance   . Chronic bronchitis     by CXR     MEDS:  Outpatient Prescriptions Prior to Visit  Medication Sig Dispense Refill  . albuterol (VENTOLIN HFA) 108 (90 BASE) MCG/ACT inhaler Inhale 2 puffs into the lungs every 6 (six) hours as needed for wheezing.  1 Inhaler  1  . amiodarone (PACERONE) 200 MG tablet Take 1 tablet (200 mg total) by mouth daily.  90 tablet  3  . carvedilol (COREG) 25 MG tablet Take 1 tablet (25 mg total) by mouth 2 (two) times daily with a meal.  180 tablet  3  . eplerenone (INSPRA) 25 MG tablet Take 1 tablet (25 mg total) by mouth daily.  90 tablet  3  . furosemide (LASIX) 40 MG tablet Take 1 tablet (40 mg total) by mouth daily.  90 tablet  3  . mexiletine (MEXITIL) 150 MG capsule Take 2 capsules (300 mg total) by mouth 2 (two) times daily.  120 capsule  11  . MULTIPLE VITAMINS PO Take 1 capsule by mouth daily.        . Multiple Vitamins-Minerals (PRESERVISION AREDS 2 PO) Take 1 tablet by mouth daily.        [name]  nitroGLYCERIN (NITROSTAT) 0.4 MG SL tablet Place 1 tablet (0.4 mg total) under the tongue every 5 (five) minutes as needed.  25 tablet  2  . saw palmetto 500 MG capsule Take 500 mg by mouth daily.        . valsartan (DIOVAN) 80 MG tablet Take 40 mg by mouth daily.      [name] warfarin (COUMADIN) 5 MG tablet Take 1 tablet (5 mg total) by mouth daily.  180 tablet  1  . donepezil (ARICEPT) 10 MG tablet Take 1 tablet (10 mg total) by mouth at bedtime as needed.  30 tablet  5  . Omega-3 Fatty Acids (FISH OIL) 1000 MG CAPS Take 2 capsules by mouth daily.       . predniSONE (DELTASONE) 20 MG tablet 2 tabs po qd x 5d, then 1 tab po qd x 5d  15 tablet  0  Not on prednisone or aricept now.  PE: Blood pressure 113/77, pulse 60, temperature 97.4 F (36.3 C), temperature source Temporal, height _0  (1.803 m), weight 237 lb (107.502 kg). VS: noted--normal. Gen: alert, NAD, NONTOXIC APPEARING. HEENT: eyes without injection, drainage, or swelling.  Ears: EACs clear, TMs with normal light reflex and landmarks.   Nose: Clear rhinorrhea, with some dried, crusty exudate adherent to mildly injected mucosa.  No purulent d/c.  No paranasal sinus TTP.  No facial swelling.  Throat and mouth without focal lesion.  No pharyngial swelling, erythema, or exudate.   Neck: supple, no LAD.   LUNGS: CTA bilat, nonlabored resps.   CV: RRR, no m/r/g. EXT: no c/c/e SKIN: no rash    IMPRESSION AND PLAN:  Sinusitis acute Keflex 561m tid x 10d. Xyzal 522mqd. Salt water gargle and chloraseptic spray encouraged.      FOLLOW UP: prn

## 2011-04-22 LAB — REMOTE ICD DEVICE
AL AMPLITUDE: 2.3 mv
ATRIAL PACING ICD: 99 pct
CHARGE TIME: 8.6 s
PACEART VT: 17
TOT-0006: 20130103000000
TZAT-0001SLOWVT: 1
TZAT-0013FASTVT: 2
TZAT-0013SLOWVT: 4
TZAT-0018SLOWVT: NEGATIVE
TZON-0003FASTVT: 333.3 ms
TZON-0003SLOWVT: 600 ms
TZST-0001FASTVT: 3
TZST-0001FASTVT: 6
TZST-0001FASTVT: 7
TZST-0001SLOWVT: 5
TZST-0001SLOWVT: 6
TZST-0002SLOWVT: NEGATIVE
TZST-0002SLOWVT: NEGATIVE
TZST-0002SLOWVT: NEGATIVE
TZST-0003FASTVT: 41 J
TZST-0003FASTVT: 41 J
TZST-0003FASTVT: 41 J
VENTRICULAR PACING ICD: 100 pct

## 2011-04-26 ENCOUNTER — Ambulatory Visit (INDEPENDENT_AMBULATORY_CARE_PROVIDER_SITE_OTHER): Payer: Medicare Other | Admitting: Pharmacist

## 2011-04-26 DIAGNOSIS — I4891 Unspecified atrial fibrillation: Secondary | ICD-10-CM

## 2011-04-26 DIAGNOSIS — Z7901 Long term (current) use of anticoagulants: Secondary | ICD-10-CM | POA: Diagnosis not present

## 2011-05-02 NOTE — Progress Notes (Signed)
Remote icd check  

## 2011-05-06 ENCOUNTER — Encounter: Payer: Self-pay | Admitting: Emergency Medicine

## 2011-05-06 ENCOUNTER — Ambulatory Visit (INDEPENDENT_AMBULATORY_CARE_PROVIDER_SITE_OTHER)
Admission: RE | Admit: 2011-05-06 | Discharge: 2011-05-06 | Disposition: A | Discharge status: Home / Self Care | Payer: Medicare Other | Source: Ambulatory Visit | Attending: Emergency Medicine | Admitting: Emergency Medicine

## 2011-05-06 ENCOUNTER — Telehealth: Payer: Self-pay | Admitting: Emergency Medicine

## 2011-05-06 ENCOUNTER — Other Ambulatory Visit: Payer: Self-pay | Admitting: Family Medicine

## 2011-05-06 ENCOUNTER — Ambulatory Visit (INDEPENDENT_AMBULATORY_CARE_PROVIDER_SITE_OTHER): Payer: Medicare Other | Admitting: Emergency Medicine

## 2011-05-06 VITALS — BP 112/74 | HR 74 | Temp 97.9°F | Ht 71.0 in | Wt 241.0 lb

## 2011-05-06 DIAGNOSIS — J209 Acute bronchitis, unspecified: Secondary | ICD-10-CM

## 2011-05-06 DIAGNOSIS — R059 Cough, unspecified: Secondary | ICD-10-CM | POA: Diagnosis not present

## 2011-05-06 DIAGNOSIS — R05 Cough: Secondary | ICD-10-CM | POA: Diagnosis not present

## 2011-05-06 DIAGNOSIS — J309 Allergic rhinitis, unspecified: Secondary | ICD-10-CM | POA: Diagnosis not present

## 2011-05-06 DIAGNOSIS — I10 Essential (primary) hypertension: Secondary | ICD-10-CM | POA: Diagnosis not present

## 2011-05-06 DIAGNOSIS — R0602 Shortness of breath: Secondary | ICD-10-CM | POA: Diagnosis not present

## 2011-05-06 MED ORDER — MOXIFLOXACIN HCL 400 MG PO TABS: 400.0000 mg | ORAL_TABLET | Freq: Every day | ORAL | Status: AC

## 2011-05-06 MED ORDER — FLUTICASONE PROPIONATE 50 MCG/ACT NA SUSP: 2.0000 | Freq: Two times a day (BID) | NASAL | Status: DC

## 2011-05-06 MED ORDER — ALBUTEROL SULFATE HFA 108 (90 BASE) MCG/ACT IN AERS: 2.0000 | INHALATION_SPRAY | Freq: Four times a day (QID) | RESPIRATORY_TRACT | Status: DC | PRN

## 2011-05-06 NOTE — Patient Instructions (Addendum)
CXR today Take Avelox 424m daily for the next 7 days. Make sure the anticoagulation clinic knows about this medication Continue your xyzal  Start fluticasone nasal spray, 2 sprays each nostril twice a day Continue albuterol as needed You will probably need breathing testing after this illness clears up Follow with Dr CGwenette Greetin 2 weeks to assess your progress

## 2011-05-06 NOTE — Progress Notes (Signed)
Subjective:    Patient ID: [name], male    DOB: [birth year], 71 y.o.   MRN: [id number]  HPI 71 yo man former smoker without AFL on spiro 01/2010, hx ischemic CM and EF 20%, A fib s/p ablation. Has been rx on several occasions for nasal drainage, cough, green mucous. Was doing well until about 5 weeks ago when he developed nasal drainage, watery clear mucous. Has coughed up light green most of the time. For last 3-4 days has felt fatigued, weak. Had some brown mucous this am.    Review of Systems  Constitutional: Negative.  Negative for fever and unexpected weight change.  HENT: Positive for sore throat, rhinorrhea and sinus pressure. Negative for ear pain, nosebleeds, congestion, sneezing, trouble swallowing, dental problem and postnasal drip.   Eyes: Negative.  Negative for redness and itching.  Respiratory: Positive for cough, chest tightness, shortness of breath and wheezing.   Cardiovascular: Negative.  Negative for palpitations and leg swelling.  Gastrointestinal: Negative.  Negative for nausea and vomiting.  Genitourinary: Negative.  Negative for dysuria.  Musculoskeletal: Negative.  Negative for joint swelling.  Skin: Negative.  Negative for rash.  Neurological: Negative.  Negative for headaches.  Hematological: Negative.  Does not bruise/bleed easily.  Psychiatric/Behavioral: Negative.  Negative for dysphoric mood. The patient is not nervous/anxious.     Past Medical History  Diagnosis Date  . Atrial fibrillation     Ablation for a-flutter 05/2000  . Cardiomyopathy     Ischemic.  EF 20%  . Hypertension   . Hyperlipidemia   . Allergy     rhinitis  . Obstructive sleep apnea     CPAP 16  . CAD (coronary artery disease)     PTCA RCA 1997 (Inf/post MI)  . Ventricular tachycardia     ICD, pacer  . Adenomatous colon polyp     Colonoscopy 07/2007---Dr. Watt Climes, [facility] (Rpt 5 yrs recommended)  . Kidney stones     No distal stones on CT 2010  . Myocardial infarction   .  Microscopic hematuria   . Diverticulosis of colon 2005    Noted on Colonoscopies in 2005 and 2009 and on CT 2010  . Internal hemorrhoids     Colonoscopy 07/2007  . Hepatic steatosis 2010    Noted on CT abd 2010 and on u/s abd 04/2009 (transaminasemia)  . Allergic rhinitis   . BPH (benign prostatic hypertrophy)     PSA 04/15/07 was 1.44  . History of impaired glucose tolerance   . Chronic bronchitis     by CXR      Family History  Problem Relation Age of Onset  . Heart disease Mother   . Cancer Maternal Grandmother     breast  . Coronary artery disease Other     family hx of  . Hyperlipidemia Other     family hx of  . Heart attack Father     4 stents/ pacemaker  . Cancer Father     prostate  . Hypertension Sister   . Goiter Paternal Grandmother   . Nephrolithiasis Son      History   Social History  . Marital Status: Married    Spouse Name: N/A    Number of Children: N/A  . Years of Education: N/A   Occupational History  . Retired    Social History Main Topics  . Smoking status: Former Smoker -- 2.0 packs/day for 15 years    Types: Cigarettes    Quit date:  01/15/1980  . Smokeless tobacco: Never Used  . Alcohol Use: No     former moderate drinker, none in 8 years  . Drug Use: No  . Sexually Active: Not on file   Other Topics Concern  . Not on file   Social History Narrative  . No narrative on file     Allergies  Allergen Reactions  . Sulfa Antibiotics Rash  . Amiodarone Other (See Comments)    Corneal deposits     Outpatient Prescriptions Prior to Visit  Medication Sig Dispense Refill  . albuterol (VENTOLIN HFA) 108 (90 BASE) MCG/ACT inhaler Inhale 2 puffs into the lungs every 6 (six) hours as needed for wheezing.  1 Inhaler  1  . amiodarone (PACERONE) 200 MG tablet Take 1 tablet (200 mg total) by mouth daily.  90 tablet  3  . carvedilol (COREG) 25 MG tablet Take 1 tablet (25 mg total) by mouth 2 (two) times daily with a meal.  180 tablet  3  .  eplerenone (INSPRA) 25 MG tablet Take 1 tablet (25 mg total) by mouth daily.  90 tablet  3  . Flaxseed, Linseed, (FLAX SEED OIL PO) Take by mouth.      . furosemide (LASIX) 40 MG tablet Take 1 tablet (40 mg total) by mouth daily.  90 tablet  3  . levocetirizine (XYZAL) 5 MG tablet Take 1 tablet (5 mg total) by mouth every evening.  30 tablet  6  . mexiletine (MEXITIL) 150 MG capsule Take 2 capsules (300 mg total) by mouth 2 (two) times daily.  120 capsule  11  . MULTIPLE VITAMINS PO Take 1 capsule by mouth daily.        . Multiple Vitamins-Minerals (PRESERVISION AREDS 2 PO) Take 1 tablet by mouth daily.        . nitroGLYCERIN (NITROSTAT) 0.4 MG SL tablet Place 1 tablet (0.4 mg total) under the tongue every 5 (five) minutes as needed.  25 tablet  2  . saw palmetto 500 MG capsule Take 500 mg by mouth daily.        . valsartan (DIOVAN) 80 MG tablet Take 40 mg by mouth daily.      [name] warfarin (COUMADIN) 5 MG tablet Take 1 tablet (5 mg total) by mouth daily.  180 tablet  1  . donepezil (ARICEPT) 10 MG tablet Take 1 tablet (10 mg total) by mouth at bedtime as needed.  30 tablet  5       Objective:   Physical Exam  Gen: Pleasant, well-nourished, in no distress,  normal affect  ENT: No lesions,  mouth clear,  oropharynx clear, no postnasal drip, no stridor  Neck: No JVD, no TMG, no carotid bruits  Lungs: No use of accessory muscles, no wheeze, some focal L basilar insp and exp crackles  Cardiovascular: RRR, heart sounds normal, no murmur or gallops, no peripheral edema  Musculoskeletal: No deformities, no cyanosis or clubbing  Neuro: alert, non focal  Skin: Warm, no lesions or rashes     Assessment & Plan:  ALLERGIC RHINITIS CAUSE UNSPECIFIED - needs to be on an allergy regimen, probably all yr but certainly during fall and spring   Acute bronchitis The fatigue, change in mucous production to darker phlegm both concerning for acute bronchitis. His ;lung exam has L basilar crackles,  need to also consider CAP (suspect that this is atx in setting CM).  - CXR now - avelox x 7 days >> need to be mindful of effects  on coumadin and amio - rov w [place] in 2 weeks

## 2011-05-06 NOTE — Telephone Encounter (Signed)
RX sent

## 2011-05-06 NOTE — Assessment & Plan Note (Addendum)
The fatigue, change in mucous production to darker phlegm both concerning for acute bronchitis. His ;lung exam has L basilar crackles, need to also consider CAP (suspect that this is atx in setting CM).  - CXR now - avelox x 7 days >> need to be mindful of effects on coumadin and amio - rov w [facility] in 2 weeks

## 2011-05-06 NOTE — Telephone Encounter (Addendum)
Pt stated that "y'all have gotten me in a bind".  Pt wants to know what's going on regarding the avelox.  Pt can be reached at [phone].  Pt is also requesting his CXR results.   [name]

## 2011-05-06 NOTE — Assessment & Plan Note (Signed)
-  needs to be on an allergy regimen, probably all yr but certainly during fall and spring

## 2011-05-06 NOTE — Telephone Encounter (Signed)
Notes Recorded by Collene Gobble, MD on 05/06/2011 at 12:54 PM Please tell pt his CXR is stable, no evidence of acute PNA. Thanks   Pt is aware of CXR results and pharmacy aware it is okay to fill Avelox. RB aware of warnings regarding Avelox and Amiodarone. Pt will be on the Avelox for a short time and RB feels the benefits out way any risks. Pt and pharmacy notified.

## 2011-05-10 ENCOUNTER — Ambulatory Visit (INDEPENDENT_AMBULATORY_CARE_PROVIDER_SITE_OTHER): Payer: Medicare Other | Admitting: *Deleted

## 2011-05-10 DIAGNOSIS — I4891 Unspecified atrial fibrillation: Secondary | ICD-10-CM

## 2011-05-10 DIAGNOSIS — Z7901 Long term (current) use of anticoagulants: Secondary | ICD-10-CM

## 2011-05-17 ENCOUNTER — Ambulatory Visit (INDEPENDENT_AMBULATORY_CARE_PROVIDER_SITE_OTHER): Payer: Medicare Other | Admitting: Pharmacist

## 2011-05-17 DIAGNOSIS — I4891 Unspecified atrial fibrillation: Secondary | ICD-10-CM | POA: Diagnosis not present

## 2011-05-17 DIAGNOSIS — Z7901 Long term (current) use of anticoagulants: Secondary | ICD-10-CM | POA: Diagnosis not present

## 2011-05-17 LAB — POCT INR: INR: 1.9

## 2011-05-22 ENCOUNTER — Encounter: Payer: Self-pay | Admitting: Pulmonary Disease

## 2011-05-22 ENCOUNTER — Ambulatory Visit (INDEPENDENT_AMBULATORY_CARE_PROVIDER_SITE_OTHER): Payer: Medicare Other | Admitting: Pulmonary Disease

## 2011-05-22 VITALS — BP 122/74 | HR 60 | Temp 98.2°F | Ht 71.0 in | Wt 238.2 lb

## 2011-05-22 DIAGNOSIS — J019 Acute sinusitis, unspecified: Secondary | ICD-10-CM | POA: Diagnosis not present

## 2011-05-22 DIAGNOSIS — G4733 Obstructive sleep apnea (adult) (pediatric): Secondary | ICD-10-CM | POA: Diagnosis not present

## 2011-05-22 NOTE — Assessment & Plan Note (Signed)
The patient is much improved after his acute sick visit a few weeks ago.  He feels that he is nearly back to baseline.  He has a propensity to develop cyclical coughing, and I have given him some of the behavioral therapies to try to keep this from escalating.  He will let me know if his cough persists, or if it worsens.

## 2011-05-22 NOTE — Progress Notes (Signed)
  Subjective:    Patient ID: [name], male    DOB: 1940/08/15, 71 y.o.   MRN: [id number]  HPI Patient comes in today for followup after his recent episode of sinobronchitis versus rhinitis.  He is greatly improved since his last acute sick visit, and feels that his cough has almost totally resolved.  He has no further sinus or chest congestion, but still has some degree of postnasal drip.  He denies any shortness of breath at this time.   Review of Systems  Constitutional: Negative.  Negative for fever and unexpected weight change.  HENT: Negative.  Negative for ear pain, nosebleeds, congestion, sore throat, rhinorrhea, sneezing, trouble swallowing, dental problem, postnasal drip and sinus pressure.   Eyes: Negative.  Negative for redness and itching.  Respiratory: Positive for cough and wheezing. Negative for chest tightness and shortness of breath.   Cardiovascular: Negative.  Negative for palpitations and leg swelling.  Gastrointestinal: Negative.  Negative for nausea and vomiting.  Genitourinary: Negative.  Negative for dysuria.  Musculoskeletal: Negative.  Negative for joint swelling.  Skin: Negative.  Negative for rash.  Neurological: Negative.  Negative for headaches.  Hematological: Negative.  Does not bruise/bleed easily.  Psychiatric/Behavioral: Negative.  Negative for dysphoric mood. The patient is not nervous/anxious.        Objective:   Physical Exam Overweight male in no acute distress Nose without purulence or discharge noted Oropharynx clear Chest totally clear to auscultation, no wheezing Cardiac exam with regular rate and rhythm Lower extremities with no edema, no cyanosis Alert and oriented, moves all 4 extremities.       Assessment & Plan:

## 2011-05-22 NOTE — Assessment & Plan Note (Signed)
The patient has been doing fairly well on CPAP prior to getting sick.  I have asked him to get back on his CPAP on a regular basis, and to followup with me in one year.

## 2011-05-22 NOTE — Patient Instructions (Signed)
Stay on mucinex for another 2 weeks, then discontinue Stay on nasal spray until the summer arrives and allergy season is over. Continue with cpap, adjust humidity as needed. Be careful not to clear throat, do not overuse your voice.  Can use hard candy to limit throat clearing. followup with me in one year.

## 2011-05-24 ENCOUNTER — Other Ambulatory Visit: Payer: Self-pay | Admitting: *Deleted

## 2011-05-24 MED ORDER — ALBUTEROL SULFATE HFA 108 (90 BASE) MCG/ACT IN AERS: 2.0000 | INHALATION_SPRAY | Freq: Four times a day (QID) | RESPIRATORY_TRACT | Status: DC | PRN

## 2011-05-24 NOTE — Telephone Encounter (Signed)
Faxed refill request received from pharmacy for Ventolin Last filled by MD on 05/06/11 Last seen on 03/26/11 Follow up  05/29/11 RX sent.

## 2011-05-28 ENCOUNTER — Ambulatory Visit: Payer: Medicare Other | Admitting: Family Medicine

## 2011-05-28 DIAGNOSIS — B351 Tinea unguium: Secondary | ICD-10-CM | POA: Diagnosis not present

## 2011-05-29 ENCOUNTER — Ambulatory Visit (INDEPENDENT_AMBULATORY_CARE_PROVIDER_SITE_OTHER): Payer: Medicare Other | Admitting: Family Medicine

## 2011-05-29 ENCOUNTER — Encounter: Payer: Self-pay | Admitting: Family Medicine

## 2011-05-29 VITALS — BP 105/73 | HR 60 | Temp 96.3°F | Ht 71.0 in | Wt 238.0 lb

## 2011-05-29 DIAGNOSIS — I10 Essential (primary) hypertension: Secondary | ICD-10-CM

## 2011-05-29 DIAGNOSIS — E538 Deficiency of other specified B group vitamins: Secondary | ICD-10-CM | POA: Diagnosis not present

## 2011-05-29 DIAGNOSIS — Z8639 Personal history of other endocrine, nutritional and metabolic disease: Secondary | ICD-10-CM | POA: Insufficient documentation

## 2011-05-29 DIAGNOSIS — J209 Acute bronchitis, unspecified: Secondary | ICD-10-CM

## 2011-05-29 DIAGNOSIS — F0390 Unspecified dementia without behavioral disturbance: Secondary | ICD-10-CM

## 2011-05-29 DIAGNOSIS — G3184 Mild cognitive impairment, so stated: Secondary | ICD-10-CM | POA: Diagnosis not present

## 2011-05-29 DIAGNOSIS — R7401 Elevation of levels of liver transaminase levels: Secondary | ICD-10-CM

## 2011-05-29 DIAGNOSIS — I5022 Chronic systolic (congestive) heart failure: Secondary | ICD-10-CM | POA: Diagnosis not present

## 2011-05-29 LAB — BASIC METABOLIC PANEL
Calcium: 8.6 mg/dL (ref 8.4–10.5)
Creatinine, Ser: 1.2 mg/dL (ref 0.4–1.5)

## 2011-05-29 LAB — VITAMIN B12: Vitamin B-12: 384 pg/mL (ref 211–911)

## 2011-05-29 LAB — MAGNESIUM: Magnesium: 2.1 mg/dL (ref 1.5–2.5)

## 2011-05-29 LAB — PHOSPHORUS: Phosphorus: 2.2 mg/dL — ABNORMAL LOW (ref 2.3–4.6)

## 2011-05-29 NOTE — Assessment & Plan Note (Signed)
Sx's 80% resolved, gradually dissipating. I encouraged him to change his use of albuterol to q6h PRN now instead of tid scheduled.

## 2011-05-29 NOTE — Assessment & Plan Note (Signed)
Diuretic therapy. Will check BMET + Mg and Phos.

## 2011-05-29 NOTE — Progress Notes (Signed)
OFFICE NOTE  05/29/2011  CC:  Chief Complaint  Patient presents with  . Follow-up    cognitive impairment     HPI: Patient is a 71 y.o. Caucasian male who is here for 2 mo f/u mild cognitive impairment, has been on aricept 58m a few months and was feeling improved at last follow up.  However, he had weird nightmares that he associated with increase from 573mto 1080mricept dosing, so he stopped the med.  He continues to report that the mild cognitive impairment is not a significant issue anymore PLUS his weird nightmares went away upon d/c of aricept 73m82m  Getting over a long resp illness, I initially saw him for this 03/20/11 and treated with 10d steroid taper but this didn't help much so at f/u I added keflex.  Sx's alternated from mostly upper to mostly lower--he ultimately saw Dr. BynuMalvin Johnspulmonology and a CXR was done and he was put on avelox 400mg46mx 7d and he reports feeling 80+% better regarding cough and congestion.  Still using albuterol tid scheduled and taking mucinex regularly--more out of fear of sx's returning than for treatment of any ongoing sx's per pt report today. No fevers.  Appetite and energy level good.   Pertinent PMH:  Past Medical History  Diagnosis Date  . Atrial fibrillation     Ablation for a-flutter 05/2000  . Cardiomyopathy     Ischemic.  EF 20%  . Hypertension   . Hyperlipidemia   . Allergy     rhinitis  . Obstructive sleep apnea     CPAP 16  . CAD (coronary artery disease)     PTCA RCA 1997 (Inf/post MI)  . Ventricular tachycardia     ICD, pacer  . Adenomatous colon polyp     [facility]2007---[facility] (Rpt 5 yrs recommended)  . Kidney stones     No distal stones on CT 2010  . Myocardial infarction   . Microscopic hematuria   . Diverticulosis of colon 2005    Noted on Colonoscopies in 2005 and 2009 and on CT 2010  . Internal hemorrhoids     Colonoscopy 07/2007  . Hepatic steatosis 2010    Noted on CT abd 2010 and on u/s  abd 04/2009 (transaminasemia)  . Allergic rhinitis   . BPH (benign prostatic hypertrophy)     PSA 04/15/07 was 1.44  . History of impaired glucose tolerance    Past surgical, social, and family history reviewed and no changes noted since last office visit.  MEDS:  Outpatient Prescriptions Prior to Visit  Medication Sig Dispense Refill  . albuterol (VENTOLIN HFA) 108 (90 BASE) MCG/ACT inhaler Inhale 2 puffs into the lungs every 6 (six) hours as needed for wheezing.  1 Inhaler  2  . amiodarone (PACERONE) 200 MG tablet Take 1 tablet (200 mg total) by mouth daily.  90 tablet  3  . carvedilol (COREG) 25 MG tablet Take 1 tablet (25 mg total) by mouth 2 (two) times daily with a meal.  180 tablet  3  . eplerenone (INSPRA) 25 MG tablet Take 1 tablet (25 mg total) by mouth daily.  90 tablet  3  . Flaxseed, Linseed, (FLAX SEED OIL PO) Take by mouth.      . fluticasone (FLONASE) 50 MCG/ACT nasal spray Place 2 sprays into the nose 2 (two) times daily.  16 g  12  . furosemide (LASIX) 40 MG tablet Take 1 tablet (40 mg total)  by mouth daily.  90 tablet  3  . guaiFENesin (MUCINEX) 600 MG 12 hr tablet Take 1,200 mg by mouth 2 (two) times daily as needed.      [name] levocetirizine (XYZAL) 5 MG tablet Take 1 tablet (5 mg total) by mouth every evening.  30 tablet  6  . mexiletine (MEXITIL) 150 MG capsule Take 2 capsules (300 mg total) by mouth 2 (two) times daily.  120 capsule  11  . MULTIPLE VITAMINS PO Take 1 capsule by mouth daily.        . Multiple Vitamins-Minerals (PRESERVISION AREDS 2 PO) Take 1 tablet by mouth daily.        . nitroGLYCERIN (NITROSTAT) 0.4 MG SL tablet Place 1 tablet (0.4 mg total) under the tongue every 5 (five) minutes as needed.  25 tablet  2  . saw palmetto 500 MG capsule Take 500 mg by mouth daily.        . valsartan (DIOVAN) 80 MG tablet Take 40 mg by mouth daily.      [name] warfarin (COUMADIN) 5 MG tablet Take 1 tablet (5 mg total) by mouth daily.  180 tablet  1    PE: Blood pressure  105/73, pulse 60, temperature 96.3 F (35.7 C), temperature source Temporal, height 5' 11" (1.803 m), weight 238 lb (107.956 kg). Gen: Alert, well appearing.  Patient is oriented to person, place, time, and situation. CV: RRR (rate 65-70 by me), 2/6 systolic murmur with obscured S1 but S2 clear.  No diastolic murmur.   LUNGS: CTA bilat except trace early insp crackles in both bases c/w atelectasis, nonlabored resps.  No wheezing and no prolongation of expiratory phase.  IMPRESSION AND PLAN:  Mild cognitive impairment He feels this is minimally bothersome lately, even off of medication. If mild cognitive problems return he may restart aricept 32m qd and keep it at this dose, since he associated the 112mdosing with weird nightmares.  SYSTOLIC HEART FAILURE, CHRONIC Diuretic therapy. Will check BMET + Mg and Phos.  Acute bronchitis Sx's 80% resolved, gradually dissipating. I encouraged him to change his use of albuterol to q6h PRN now instead of tid scheduled.   History of non anemic vitamin B12 deficiency Pt cannot recall for sure, but thinks he was deficient of this in the past.  He can't recall if he ever got vit B12 injections, though. Will recheck vit B12 level today.      FOLLOW UP:  47m87moarlier if problems.

## 2011-05-29 NOTE — Assessment & Plan Note (Signed)
He feels this is minimally bothersome lately, even off of medication. If mild cognitive problems return he may restart aricept 2m qd and keep it at this dose, since he associated the 113mdosing with weird nightmares.

## 2011-05-29 NOTE — Assessment & Plan Note (Signed)
Pt cannot recall for sure, but thinks he was deficient of this in the past.  He can't recall if he ever got vit B12 injections, though. Will recheck vit B12 level today.

## 2011-05-30 ENCOUNTER — Ambulatory Visit (INDEPENDENT_AMBULATORY_CARE_PROVIDER_SITE_OTHER): Payer: Medicare Other

## 2011-05-30 DIAGNOSIS — Z7901 Long term (current) use of anticoagulants: Secondary | ICD-10-CM

## 2011-05-30 DIAGNOSIS — I4891 Unspecified atrial fibrillation: Secondary | ICD-10-CM | POA: Diagnosis not present

## 2011-05-30 LAB — POCT INR: INR: 2.9

## 2011-06-27 ENCOUNTER — Ambulatory Visit (INDEPENDENT_AMBULATORY_CARE_PROVIDER_SITE_OTHER): Payer: Medicare Other | Admitting: *Deleted

## 2011-06-27 DIAGNOSIS — I4891 Unspecified atrial fibrillation: Secondary | ICD-10-CM | POA: Diagnosis not present

## 2011-06-27 DIAGNOSIS — Z7901 Long term (current) use of anticoagulants: Secondary | ICD-10-CM

## 2011-07-09 ENCOUNTER — Telehealth: Payer: Self-pay | Admitting: *Deleted

## 2011-07-09 MED ORDER — LOSARTAN POTASSIUM 50 MG PO TABS: 50.0000 mg | ORAL_TABLET | Freq: Every day | ORAL | Status: DC

## 2011-07-09 NOTE — Telephone Encounter (Signed)
Received fax from [place], pt needing to try generic alternative for diovan which includes losartan-irbesartan or benicar. The pt is currently taking diovan 80 mg daily. Will forward for dr Stanford Breed review

## 2011-07-09 NOTE — Telephone Encounter (Signed)
Spoke with pt, aware of med change.

## 2011-07-09 NOTE — Telephone Encounter (Signed)
DC diovan and begin cozaar 50 mg po daily Kirk Ruths

## 2011-07-25 ENCOUNTER — Encounter: Payer: Self-pay | Admitting: Internal Medicine

## 2011-07-25 ENCOUNTER — Ambulatory Visit (INDEPENDENT_AMBULATORY_CARE_PROVIDER_SITE_OTHER): Payer: Medicare Other | Admitting: *Deleted

## 2011-07-25 DIAGNOSIS — I4729 Other ventricular tachycardia: Secondary | ICD-10-CM | POA: Diagnosis not present

## 2011-07-25 DIAGNOSIS — I472 Ventricular tachycardia: Secondary | ICD-10-CM | POA: Diagnosis not present

## 2011-07-25 DIAGNOSIS — I4891 Unspecified atrial fibrillation: Secondary | ICD-10-CM

## 2011-07-25 DIAGNOSIS — Z7901 Long term (current) use of anticoagulants: Secondary | ICD-10-CM | POA: Diagnosis not present

## 2011-08-02 ENCOUNTER — Other Ambulatory Visit: Payer: Self-pay | Admitting: Cardiology

## 2011-08-02 LAB — REMOTE ICD DEVICE
BRDY-0003LV: 90 {beats}/min
BRDY-0004LV: 95 {beats}/min
CHARGE TIME: 8.7 s
FVT: 0
HV IMPEDENCE: 48 Ohm
PACEART VT: 2
RV LEAD IMPEDENCE ICD: 690 Ohm
TZAT-0001FASTVT: 1
TZAT-0001SLOWVT: 1
TZAT-0013FASTVT: 2
TZAT-0013SLOWVT: 4
TZAT-0018FASTVT: NEGATIVE
TZAT-0018SLOWVT: NEGATIVE
TZAT-0018SLOWVT: NEGATIVE
TZST-0001FASTVT: 3
TZST-0001FASTVT: 4
TZST-0001FASTVT: 6
TZST-0001SLOWVT: 3
TZST-0001SLOWVT: 4
TZST-0001SLOWVT: 7
TZST-0002SLOWVT: NEGATIVE
TZST-0002SLOWVT: NEGATIVE
TZST-0002SLOWVT: NEGATIVE
TZST-0002SLOWVT: NEGATIVE
TZST-0003FASTVT: 26 J
TZST-0003FASTVT: 41 J
TZST-0003FASTVT: 41 J
VENTRICULAR PACING ICD: 100 pct

## 2011-08-09 ENCOUNTER — Ambulatory Visit (INDEPENDENT_AMBULATORY_CARE_PROVIDER_SITE_OTHER): Payer: Medicare Other | Admitting: *Deleted

## 2011-08-09 DIAGNOSIS — Z7901 Long term (current) use of anticoagulants: Secondary | ICD-10-CM

## 2011-08-09 DIAGNOSIS — I4891 Unspecified atrial fibrillation: Secondary | ICD-10-CM

## 2011-08-09 LAB — POCT INR: INR: 2.7

## 2011-08-29 ENCOUNTER — Encounter: Payer: Self-pay | Admitting: *Deleted

## 2011-08-30 ENCOUNTER — Telehealth: Payer: Self-pay | Admitting: Cardiology

## 2011-08-30 NOTE — Telephone Encounter (Signed)
LMOM that we will ask Dr.Crenshaw to review chart and let us know if he needs lab work prior to 9/26 appointment. Will call him back again next week.

## 2011-08-30 NOTE — Telephone Encounter (Signed)
Will review at time of ov [name]

## 2011-08-30 NOTE — Telephone Encounter (Signed)
New msg Pt wants to know if he should get blood work before appt in Sept with Dr Stanford Breed. Please let him know

## 2011-09-03 NOTE — Telephone Encounter (Signed)
Aware of dr Jacalyn Lefevre recommendations.

## 2011-09-06 ENCOUNTER — Other Ambulatory Visit: Payer: Self-pay | Admitting: Cardiology

## 2011-09-13 ENCOUNTER — Ambulatory Visit (INDEPENDENT_AMBULATORY_CARE_PROVIDER_SITE_OTHER): Payer: Medicare Other | Admitting: Pharmacist

## 2011-09-13 DIAGNOSIS — Z7901 Long term (current) use of anticoagulants: Secondary | ICD-10-CM

## 2011-09-13 DIAGNOSIS — I4891 Unspecified atrial fibrillation: Secondary | ICD-10-CM | POA: Diagnosis not present

## 2011-09-13 LAB — POCT INR: INR: 3.1

## 2011-09-27 ENCOUNTER — Ambulatory Visit: Payer: Medicare Other | Admitting: Cardiology

## 2011-09-27 DIAGNOSIS — Z23 Encounter for immunization: Secondary | ICD-10-CM | POA: Diagnosis not present

## 2011-09-30 ENCOUNTER — Other Ambulatory Visit: Payer: Self-pay | Admitting: Cardiology

## 2011-10-03 ENCOUNTER — Other Ambulatory Visit: Payer: Self-pay | Admitting: Family Medicine

## 2011-10-03 ENCOUNTER — Ambulatory Visit (INDEPENDENT_AMBULATORY_CARE_PROVIDER_SITE_OTHER)
Admission: RE | Admit: 2011-10-03 | Discharge: 2011-10-03 | Disposition: A | Discharge status: Home / Self Care | Payer: Medicare Other | Source: Ambulatory Visit | Attending: Family Medicine | Admitting: Family Medicine

## 2011-10-03 ENCOUNTER — Encounter: Payer: Self-pay | Admitting: Family Medicine

## 2011-10-03 ENCOUNTER — Ambulatory Visit (INDEPENDENT_AMBULATORY_CARE_PROVIDER_SITE_OTHER): Payer: Medicare Other | Admitting: Family Medicine

## 2011-10-03 VITALS — BP 106/72 | HR 61 | Temp 96.4°F | Ht 71.0 in | Wt 235.0 lb

## 2011-10-03 DIAGNOSIS — R0609 Other forms of dyspnea: Secondary | ICD-10-CM

## 2011-10-03 DIAGNOSIS — R5381 Other malaise: Secondary | ICD-10-CM | POA: Diagnosis not present

## 2011-10-03 DIAGNOSIS — R5383 Other fatigue: Secondary | ICD-10-CM

## 2011-10-03 DIAGNOSIS — I509 Heart failure, unspecified: Secondary | ICD-10-CM

## 2011-10-03 DIAGNOSIS — R0989 Other specified symptoms and signs involving the circulatory and respiratory systems: Secondary | ICD-10-CM

## 2011-10-03 LAB — CBC WITH DIFFERENTIAL/PLATELET
Basophils Absolute: 0 10*3/uL (ref 0.0–0.1)
Basophils Relative: 0 % (ref 0–1)
Eosinophils Relative: 2 % (ref 0–5)
HCT: 40.7 % (ref 39.0–52.0)
Lymphocytes Relative: 16 % (ref 12–46)
MCHC: 33.9 g/dL (ref 30.0–36.0)
MCV: 101.2 fL — ABNORMAL HIGH (ref 78.0–100.0)
Monocytes Absolute: 1 10*3/uL (ref 0.1–1.0)
Platelets: 281 10*3/uL (ref 150–400)
RDW: 13.5 % (ref 11.5–15.5)
WBC: 11.4 10*3/uL — ABNORMAL HIGH (ref 4.0–10.5)

## 2011-10-03 LAB — COMPREHENSIVE METABOLIC PANEL
AST: 20 U/L (ref 0–37)
Albumin: 3.8 g/dL (ref 3.5–5.2)
Alkaline Phosphatase: 55 U/L (ref 39–117)
BUN: 20 mg/dL (ref 6–23)
Creat: 1.06 mg/dL (ref 0.50–1.35)
Glucose, Bld: 100 mg/dL — ABNORMAL HIGH (ref 70–99)
Potassium: 4.2 mEq/L (ref 3.5–5.3)
Total Bilirubin: 1.3 mg/dL — ABNORMAL HIGH (ref 0.3–1.2)

## 2011-10-03 LAB — BRAIN NATRIURETIC PEPTIDE: Brain Natriuretic Peptide: 606.7 pg/mL — ABNORMAL HIGH (ref 0.0–100.0)

## 2011-10-03 MED ORDER — POTASSIUM CHLORIDE CRYS ER 20 MEQ PO TBCR: EXTENDED_RELEASE_TABLET | ORAL | Status: DC

## 2011-10-03 NOTE — Assessment & Plan Note (Signed)
DDX: CHF/pneumonia/PE/pulm fibrosis secondary to amiodarone. Will check CXR now.  Also check D-dimer, BNP, CMET, CBC, and TSH. If all of this is unremarkable will arrange for PFT's, possible chest CT.

## 2011-10-03 NOTE — Progress Notes (Signed)
OFFICE NOTE  10/03/2011  CC:  Chief Complaint  Patient presents with  . Fatigue    extreme fatigue, shortness of breath-esp with exertion; occ hacking cough     HPI: Patient is a 71 y.o. Caucasian male who is here for respiratory complaints. Two weeks hx of feeling easily winded, fatigued, DOE.  Only occasional coughing, no wheezing or chest tightness.  No chest pain or palpitations or fevers.  No increase in abdominal girth or LE swelling.  UOP normal.  No hematochezia melena. No myalgias or arthralgias.  Admits to dietary (sodium, esp) indescretion around the beginning of all this, then in the last week had has tried to cut way back on sodium and fluids.  He has been compliant with all meds.   Pertinent PMH:  Past Medical History  Diagnosis Date  . Atrial fibrillation     Ablation for a-flutter 05/2000  . Cardiomyopathy     Ischemic.  EF 20%  . Hypertension   . Hyperlipidemia   . Allergy     rhinitis  . Obstructive sleep apnea     CPAP 16  . CAD (coronary artery disease)     PTCA RCA 1997 (Inf/post MI)  . Ventricular tachycardia     ICD, pacer  . Adenomatous colon polyp     Colonoscopy 07/2007---Dr. Watt Climes, [facility] (Rpt 5 yrs recommended)  . Kidney stones     No distal stones on CT 2010  . Myocardial infarction   . Microscopic hematuria   . Diverticulosis of colon 2005    Noted on Colonoscopies in 2005 and 2009 and on CT 2010  . Internal hemorrhoids     Colonoscopy 07/2007  . Hepatic steatosis 2010    Noted on CT abd 2010 and on u/s abd 04/2009 (transaminasemia)  . Allergic rhinitis   . BPH (benign prostatic hypertrophy)     PSA 04/15/07 was 1.44  . History of impaired glucose tolerance    Past Surgical History  Procedure Date  . [place] Contak S063- Virl Axe, MD 07/25/06 (second device)  . Breast surgery 1990    "Tissue removed from breast"  . Umbilical hernia repair   . Hernia repair 1975    bilateral inguinal  . Extracorporeal shock wave  lithotripsy   . Atrial ablation surgery     MEDS:  Outpatient Prescriptions Prior to Visit  Medication Sig Dispense Refill  . albuterol (VENTOLIN HFA) 108 (90 BASE) MCG/ACT inhaler Inhale 2 puffs into the lungs every 6 (six) hours as needed for wheezing.  1 Inhaler  2  . amiodarone (PACERONE) 200 MG tablet Take 1 tablet (200 mg total) by mouth daily.  90 tablet  3  . carvedilol (COREG) 25 MG tablet Take 1 tablet (25 mg total) by mouth 2 (two) times daily with a meal.  180 tablet  3  . eplerenone (INSPRA) 25 MG tablet Take 1 tablet (25 mg total) by mouth daily.  90 tablet  3  . Flaxseed, Linseed, (FLAX SEED OIL PO) Take by mouth.      . fluticasone (FLONASE) 50 MCG/ACT nasal spray Place 2 sprays into the nose 2 (two) times daily.  16 g  12  . furosemide (LASIX) 40 MG tablet TAKE 1 TABLET BY MOUTH EVERY DAY  90 tablet  3  . levocetirizine (XYZAL) 5 MG tablet Take 1 tablet (5 mg total) by mouth every evening.  30 tablet  6  . losartan (COZAAR) 50 MG tablet Take  1 tablet (50 mg total) by mouth daily.  30 tablet  12  . mexiletine (MEXITIL) 150 MG capsule TAKE 2 CAPSULES BY MOUTH TWICE DAILY  120 capsule  0  . MULTIPLE VITAMINS PO Take 1 capsule by mouth daily.        . Multiple Vitamins-Minerals (PRESERVISION AREDS 2 PO) Take 1 tablet by mouth daily.        . nitroGLYCERIN (NITROSTAT) 0.4 MG SL tablet Place 1 tablet (0.4 mg total) under the tongue every 5 (five) minutes as needed.  25 tablet  2  . saw palmetto 500 MG capsule Take 500 mg by mouth daily.        [name] warfarin (COUMADIN) 5 MG tablet Take 1 tablet (5 mg total) by mouth daily.  180 tablet  1  . furosemide (LASIX) 40 MG tablet TAKE 1 TABLET BY MOUTH EVERY DAY  90 tablet  2  . guaiFENesin (MUCINEX) 600 MG 12 hr tablet Take 1,200 mg by mouth 2 (two) times daily as needed.        PE: Blood pressure 106/72, pulse 61, temperature 96.4 F (35.8 C), temperature source Temporal, height _0  (1.803 m), weight 235 lb (106.595 kg), SpO2  97.00%. Gen: Alert, well appearing.  Patient is oriented to person, place, time, and situation. ENT: Eyes: no injection, icteris, swelling, or exudate.  EOMI, PERRLA. Nose: no drainage or turbinate edem5-7da/swelling.  No injection or focal lesion.  Mouth: lips without lesion/swelling.  Oral mucosa pink and moist.  Dentition intact and without obvious caries or gingival swelling.  Oropharynx without erythema, exudate, or swelling.  Neck - No masses or thyromegaly or limitation in range of motion CV: RRR, rate about 60, 2/6 systolic murmur, no rub or gallop or diastolic murmur.   LUNGS: bibasilar inspiratory crackles in both bases extending 1/3 of the way up.  Nonlabored resps. ABD: soft, NT, ND, BS normal.  No hepatospenomegaly or mass.  No bruits. EXT: no clubbing, cyanosis, or edema.   LAB: none today  IMPRESSION AND PLAN:  Dyspnea on exertion DDX: CHF/pneumonia/PE/pulm fibrosis secondary to amiodarone. Will check CXR now.  Also check D-dimer, BNP, CMET, CBC, and TSH. If all of this is unremarkable will arrange for PFT's, possible chest CT.    FOLLOW UP: 5-7d

## 2011-10-04 ENCOUNTER — Inpatient Hospital Stay (HOSPITAL_COMMUNITY)
Admission: EM | Admit: 2011-10-04 | Discharge: 2011-10-06 | DRG: 293 | Disposition: A | Discharge status: Home / Self Care | Payer: Medicare Other | Attending: Internal Medicine | Admitting: Internal Medicine

## 2011-10-04 ENCOUNTER — Telehealth: Payer: Self-pay | Admitting: Family Medicine

## 2011-10-04 ENCOUNTER — Encounter (HOSPITAL_COMMUNITY): Payer: Self-pay | Admitting: Emergency Medicine

## 2011-10-04 ENCOUNTER — Inpatient Hospital Stay (HOSPITAL_COMMUNITY): Payer: Medicare Other

## 2011-10-04 DIAGNOSIS — I509 Heart failure, unspecified: Secondary | ICD-10-CM | POA: Diagnosis present

## 2011-10-04 DIAGNOSIS — Z87891 Personal history of nicotine dependence: Secondary | ICD-10-CM

## 2011-10-04 DIAGNOSIS — I5030 Unspecified diastolic (congestive) heart failure: Secondary | ICD-10-CM | POA: Diagnosis not present

## 2011-10-04 DIAGNOSIS — R0609 Other forms of dyspnea: Secondary | ICD-10-CM | POA: Diagnosis not present

## 2011-10-04 DIAGNOSIS — I5023 Acute on chronic systolic (congestive) heart failure: Principal | ICD-10-CM | POA: Diagnosis present

## 2011-10-04 DIAGNOSIS — I5042 Chronic combined systolic (congestive) and diastolic (congestive) heart failure: Secondary | ICD-10-CM | POA: Diagnosis present

## 2011-10-04 DIAGNOSIS — E785 Hyperlipidemia, unspecified: Secondary | ICD-10-CM | POA: Diagnosis present

## 2011-10-04 DIAGNOSIS — R042 Hemoptysis: Secondary | ICD-10-CM | POA: Diagnosis not present

## 2011-10-04 DIAGNOSIS — I2510 Atherosclerotic heart disease of native coronary artery without angina pectoris: Secondary | ICD-10-CM | POA: Diagnosis not present

## 2011-10-04 DIAGNOSIS — R791 Abnormal coagulation profile: Secondary | ICD-10-CM | POA: Diagnosis not present

## 2011-10-04 DIAGNOSIS — T45515A Adverse effect of anticoagulants, initial encounter: Secondary | ICD-10-CM | POA: Diagnosis present

## 2011-10-04 DIAGNOSIS — I5021 Acute systolic (congestive) heart failure: Secondary | ICD-10-CM | POA: Diagnosis present

## 2011-10-04 DIAGNOSIS — J40 Bronchitis, not specified as acute or chronic: Secondary | ICD-10-CM | POA: Diagnosis present

## 2011-10-04 DIAGNOSIS — Z7901 Long term (current) use of anticoagulants: Secondary | ICD-10-CM | POA: Diagnosis not present

## 2011-10-04 DIAGNOSIS — I252 Old myocardial infarction: Secondary | ICD-10-CM

## 2011-10-04 DIAGNOSIS — J309 Allergic rhinitis, unspecified: Secondary | ICD-10-CM

## 2011-10-04 DIAGNOSIS — I2589 Other forms of chronic ischemic heart disease: Secondary | ICD-10-CM | POA: Diagnosis present

## 2011-10-04 DIAGNOSIS — I5022 Chronic systolic (congestive) heart failure: Secondary | ICD-10-CM | POA: Diagnosis present

## 2011-10-04 DIAGNOSIS — R0989 Other specified symptoms and signs involving the circulatory and respiratory systems: Secondary | ICD-10-CM | POA: Diagnosis not present

## 2011-10-04 DIAGNOSIS — Z79899 Other long term (current) drug therapy: Secondary | ICD-10-CM

## 2011-10-04 DIAGNOSIS — I4891 Unspecified atrial fibrillation: Secondary | ICD-10-CM | POA: Diagnosis not present

## 2011-10-04 DIAGNOSIS — D6832 Hemorrhagic disorder due to extrinsic circulating anticoagulants: Secondary | ICD-10-CM

## 2011-10-04 DIAGNOSIS — G4733 Obstructive sleep apnea (adult) (pediatric): Secondary | ICD-10-CM | POA: Diagnosis not present

## 2011-10-04 DIAGNOSIS — I10 Essential (primary) hypertension: Secondary | ICD-10-CM | POA: Diagnosis present

## 2011-10-04 DIAGNOSIS — E876 Hypokalemia: Secondary | ICD-10-CM | POA: Diagnosis not present

## 2011-10-04 DIAGNOSIS — Z9861 Coronary angioplasty status: Secondary | ICD-10-CM

## 2011-10-04 DIAGNOSIS — R7989 Other specified abnormal findings of blood chemistry: Secondary | ICD-10-CM | POA: Diagnosis not present

## 2011-10-04 LAB — POCT I-STAT TROPONIN I: Troponin i, poc: 0 ng/mL (ref 0.00–0.08)

## 2011-10-04 LAB — CBC
HCT: 41.8 % (ref 39.0–52.0)
Hemoglobin: 14.6 g/dL (ref 13.0–17.0)
WBC: 9.4 10*3/uL (ref 4.0–10.5)

## 2011-10-04 LAB — BASIC METABOLIC PANEL
BUN: 13 mg/dL (ref 6–23)
Chloride: 103 mEq/L (ref 96–112)
GFR calc Af Amer: 80 mL/min — ABNORMAL LOW (ref >90)
Glucose, Bld: 101 mg/dL — ABNORMAL HIGH (ref 70–99)
Potassium: 4 mEq/L (ref 3.5–5.1)

## 2011-10-04 LAB — PROTIME-INR: INR: 3.77 — ABNORMAL HIGH (ref 0.00–1.49)

## 2011-10-04 MED ORDER — FUROSEMIDE 10 MG/ML IJ SOLN
40.0000 mg | Freq: Three times a day (TID) | INTRAMUSCULAR | Status: DC
  Administered 2011-10-04 – 2011-10-05 (×2): 40 mg via INTRAVENOUS
  Filled 2011-10-04 (×4): qty 4

## 2011-10-04 MED ORDER — ACETAMINOPHEN 325 MG PO TABS
650.0000 mg | ORAL_TABLET | Freq: Four times a day (QID) | ORAL | Status: DC | PRN
  Administered 2011-10-04 – 2011-10-05 (×2): 650 mg via ORAL
  Filled 2011-10-04 (×2): qty 2

## 2011-10-04 MED ORDER — SODIUM CHLORIDE 0.9 % IJ SOLN
3.0000 mL | Freq: Two times a day (BID) | INTRAMUSCULAR | Status: DC
  Administered 2011-10-04 – 2011-10-05 (×2): 3 mL via INTRAVENOUS

## 2011-10-04 MED ORDER — POLYVINYL ALCOHOL 1.4 % OP SOLN
2.0000 [drp] | OPHTHALMIC | Status: DC | PRN
  Administered 2011-10-04: 2 [drp] via OPHTHALMIC
  Filled 2011-10-04: qty 15

## 2011-10-04 MED ORDER — ONDANSETRON HCL 4 MG/2ML IJ SOLN: 4.0000 mg | Freq: Four times a day (QID) | INTRAMUSCULAR | Status: DC | PRN

## 2011-10-04 MED ORDER — CARVEDILOL 25 MG PO TABS
25.0000 mg | ORAL_TABLET | Freq: Once | ORAL | Status: AC
  Administered 2011-10-04: 25 mg via ORAL
  Filled 2011-10-04: qty 1

## 2011-10-04 MED ORDER — FLUTICASONE PROPIONATE 50 MCG/ACT NA SUSP
2.0000 | Freq: Two times a day (BID) | NASAL | Status: DC
  Administered 2011-10-04 – 2011-10-05 (×2): 2 via NASAL
  Filled 2011-10-04: qty 16

## 2011-10-04 MED ORDER — MEXILETINE HCL 150 MG PO CAPS
300.0000 mg | ORAL_CAPSULE | Freq: Two times a day (BID) | ORAL | Status: DC
  Administered 2011-10-04 – 2011-10-06 (×4): 300 mg via ORAL
  Filled 2011-10-04 (×5): qty 2

## 2011-10-04 MED ORDER — AZITHROMYCIN 500 MG PO TABS
500.0000 mg | ORAL_TABLET | Freq: Every day | ORAL | Status: AC
  Administered 2011-10-04: 500 mg via ORAL
  Filled 2011-10-04: qty 1

## 2011-10-04 MED ORDER — SPIRONOLACTONE 25 MG PO TABS
25.0000 mg | ORAL_TABLET | Freq: Every day | ORAL | Status: DC
  Administered 2011-10-05 – 2011-10-06 (×2): 25 mg via ORAL
  Filled 2011-10-04 (×2): qty 1

## 2011-10-04 MED ORDER — AMIODARONE HCL 200 MG PO TABS
200.0000 mg | ORAL_TABLET | Freq: Every day | ORAL | Status: DC
  Administered 2011-10-05 – 2011-10-06 (×2): 200 mg via ORAL
  Filled 2011-10-04 (×2): qty 1

## 2011-10-04 MED ORDER — LORATADINE 10 MG PO TABS
10.0000 mg | ORAL_TABLET | Freq: Every day | ORAL | Status: DC
  Administered 2011-10-04 – 2011-10-06 (×3): 10 mg via ORAL
  Filled 2011-10-04 (×3): qty 1

## 2011-10-04 MED ORDER — ACETAMINOPHEN 650 MG RE SUPP: 650.0000 mg | Freq: Four times a day (QID) | RECTAL | Status: DC | PRN

## 2011-10-04 MED ORDER — LEVOCETIRIZINE DIHYDROCHLORIDE 5 MG PO TABS: 5.0000 mg | ORAL_TABLET | Freq: Every evening | ORAL | Status: DC

## 2011-10-04 MED ORDER — OXYCODONE HCL 5 MG PO TABS: 5.0000 mg | ORAL_TABLET | ORAL | Status: DC | PRN

## 2011-10-04 MED ORDER — ONDANSETRON HCL 4 MG PO TABS: 4.0000 mg | ORAL_TABLET | Freq: Four times a day (QID) | ORAL | Status: DC | PRN

## 2011-10-04 MED ORDER — NITROGLYCERIN 0.4 MG SL SUBL: 0.4000 mg | SUBLINGUAL_TABLET | SUBLINGUAL | Status: DC | PRN

## 2011-10-04 MED ORDER — LOSARTAN POTASSIUM 50 MG PO TABS
50.0000 mg | ORAL_TABLET | Freq: Every day | ORAL | Status: DC
  Administered 2011-10-05 – 2011-10-06 (×2): 50 mg via ORAL
  Filled 2011-10-04 (×2): qty 1

## 2011-10-04 MED ORDER — AZITHROMYCIN 250 MG PO TABS
250.0000 mg | ORAL_TABLET | Freq: Every day | ORAL | Status: DC
  Administered 2011-10-05 – 2011-10-06 (×2): 250 mg via ORAL
  Filled 2011-10-04 (×2): qty 1

## 2011-10-04 MED ORDER — CARVEDILOL 25 MG PO TABS
25.0000 mg | ORAL_TABLET | Freq: Two times a day (BID) | ORAL | Status: DC
  Administered 2011-10-05 – 2011-10-06 (×3): 25 mg via ORAL
  Filled 2011-10-04 (×5): qty 1

## 2011-10-04 MED ORDER — ALBUTEROL SULFATE HFA 108 (90 BASE) MCG/ACT IN AERS
2.0000 | INHALATION_SPRAY | Freq: Four times a day (QID) | RESPIRATORY_TRACT | Status: DC | PRN
  Filled 2011-10-04: qty 6.7

## 2011-10-04 MED ORDER — FUROSEMIDE 10 MG/ML IJ SOLN
40.0000 mg | Freq: Once | INTRAMUSCULAR | Status: AC
  Administered 2011-10-04: 40 mg via INTRAVENOUS
  Filled 2011-10-04: qty 4

## 2011-10-04 MED ORDER — POTASSIUM CHLORIDE CRYS ER 20 MEQ PO TBCR
20.0000 meq | EXTENDED_RELEASE_TABLET | Freq: Every day | ORAL | Status: DC
  Administered 2011-10-04 – 2011-10-06 (×3): 20 meq via ORAL
  Filled 2011-10-04 (×4): qty 1

## 2011-10-04 NOTE — Telephone Encounter (Signed)
Patient called to say he feels fine but when he coughed the morning but he had a little blood in his cough. I transferred patient to CAN but patient called back & said he wasn't sure how to leave a message on that line. Patient states he has a 1:45 coumadin appt that he is leaving for so you will be able to reach him on his cell phone. Please call patient back.

## 2011-10-04 NOTE — H&P (Signed)
[facility] History and Physical  [name]:[id number] DOB: 24-Sep-1940 DOA: 10/04/2011   PCP: [name], MD   Chief Complaint: hemoptysis and shortness of breath.  HPI: [name] is a 71 y.o. male  With sig hx of ischemic cardiomyopathy and atrial fibrillation on coumadin came for worsening sob, and  Cough with blood tinged sputum twice since yesterday. He denies fever, or sick contacts. He has been coughing since 1 week, productive, with 2 episodes where he has blood tinged sputum. On arrival to ED, his INR was slightly elevated. He also reports that his lasix was recently increased from 80 mg to 120 mg per day. He continues to feel fatigued and short of breath with some chest tightness associated with it. No orthopnea, or PND, or pedal edema.  He is being admitted to hospitalist service for evaluation of chf exacerbation and bronchitis.    Review of Systems: The patient denies anorexia, fever, , vision loss, decreased hearing, hoarseness, , syncope, , peripheral edema, balance deficits,, abdominal pain, melena, hematochezia, severe indigestion/heartburn, hematuria, incontinence, genital sores, muscle weakness, suspicious skin lesions, transient blindness, difficulty walking, depression, unusual weight change, , enlarged lymph nodes, angioedema, and breast masses.    Past Medical History  Diagnosis Date  . Atrial fibrillation     Ablation for a-flutter 05/2000  . Cardiomyopathy     Ischemic.  EF 20%  . Hypertension   . Hyperlipidemia   . Allergy     rhinitis  . Obstructive sleep apnea     CPAP 16  . CAD (coronary artery disease)     PTCA RCA 1997 (Inf/post MI)  . Ventricular tachycardia     ICD, pacer  . Adenomatous colon polyp     Colonoscopy 07/2007---Dr. Watt Climes, [facility] (Rpt 5 yrs recommended)  . Kidney stones     No distal stones on CT 2010  . Myocardial infarction   . Microscopic hematuria   . Diverticulosis of colon 2005    Noted on Colonoscopies in  2005 and 2009 and on CT 2010  . Internal hemorrhoids     Colonoscopy 07/2007  . Hepatic steatosis 2010    Noted on CT abd 2010 and on u/s abd 04/2009 (transaminasemia)  . Allergic rhinitis   . BPH (benign prostatic hypertrophy)     PSA 04/15/07 was 1.44  . History of impaired glucose tolerance    Past Surgical History  Procedure Date  . [place] Contak A063- Virl Axe, MD 07/25/06 (second device)  . Breast surgery 1990    "Tissue removed from breast"  . Umbilical hernia repair   . Hernia repair 1975    bilateral inguinal  . Extracorporeal shock wave lithotripsy   . Atrial ablation surgery    Social History:  reports that he quit smoking about 31 years ago. His smoking use included Cigarettes. He has a 30 pack-year smoking history. He has never used smokeless tobacco. He reports that he does not drink alcohol or use illicit drugs.   Allergies  Allergen Reactions  . Sulfa Antibiotics Rash  . Amiodarone Other (See Comments)    Corneal deposits    Family History  Problem Relation Age of Onset  . Heart disease Mother   . Cancer Maternal Grandmother     breast  . Coronary artery disease Other     family hx of  . Hyperlipidemia Other     family hx of  . Heart attack Father  4 stents/ pacemaker  . Cancer Father     prostate  . Hypertension Sister   . Goiter Paternal Grandmother   . Nephrolithiasis Son    Prior to Admission medications   Medication Sig Start Date End Date Taking? Authorizing Provider  albuterol (VENTOLIN HFA) 108 (90 BASE) MCG/ACT inhaler Inhale 2 puffs into the lungs every 6 (six) hours as needed for wheezing. 05/24/11 05/23/12 Yes [name], MD  amiodarone (PACERONE) 200 MG tablet Take 1 tablet (200 mg total) by mouth daily. 01/04/11  Yes [name], MD  carvedilol (COREG) 25 MG tablet Take 1 tablet (25 mg total) by mouth 2 (two) times daily with a meal. 01/04/11 01/04/12 Yes [name], MD  eplerenone (INSPRA) 25 MG tablet Take  1 tablet (25 mg total) by mouth daily. 01/04/11 01/04/12 Yes [name], MD  fluticasone (FLONASE) 50 MCG/ACT nasal spray Place 2 sprays into the nose 2 (two) times daily. 05/06/11 05/05/12 Yes Collene Gobble, MD  furosemide (LASIX) 40 MG tablet Take 80 mg by mouth daily.   Yes Historical Provider, MD  levocetirizine (XYZAL) 5 MG tablet Take 1 tablet (5 mg total) by mouth every evening. 04/19/11 04/18/12 Yes [name], MD  losartan (COZAAR) 50 MG tablet Take 1 tablet (50 mg total) by mouth daily. 07/09/11 07/08/12 Yes [name], MD  mexiletine (MEXITIL) 150 MG capsule Take 300 mg by mouth 2 (two) times daily.   Yes Historical Provider, MD  MULTIPLE VITAMINS PO Take 1 capsule by mouth daily.     Yes Historical Provider, MD  Multiple Vitamins-Minerals (PRESERVISION AREDS 2 PO) Take 1 tablet by mouth daily.     Yes Historical Provider, MD  nitroGLYCERIN (NITROSTAT) 0.4 MG SL tablet Place 1 tablet (0.4 mg total) under the tongue every 5 (five) minutes as needed. 06/19/10  Yes Deboraha Sprang, MD  warfarin (COUMADIN) 5 MG tablet Take 2.5-5 mg by mouth daily. Tue and Sat = 2.5 5 mg all other days 01/04/11  Yes [name], MD  potassium chloride SA (K-DUR,KLOR-CON) 20 MEQ tablet Take 20 mEq by mouth daily. 10/03/11   [name], MD   Physical Exam: Filed Vitals:   10/04/11 1457 10/04/11 1615 10/04/11 1715 10/04/11 1832  BP: 124/97 108/74 109/79 118/77  Pulse: 63 59 60 59  Temp: 97.7 F (36.5 C)   98 F (36.7 C)  TempSrc: Oral   Oral  Resp: _0 Height:    5' 11" (1.803 m)  Weight:    102.6 kg (226 lb 3.1 oz)  SpO2: 95% 98% 97% 97%    Constitutional: Vital signs reviewed.  Patient is a well-developed and well-nourished in no acute distress and cooperative with exam. Alert and oriented x3.  Head: Normocephalic and atraumatic Ear: TM normal bilaterally Mouth: no erythema or exudates, MMM Eyes: PERRL, EOMI, conjunctivae normal, No scleral icterus.  Neck: Supple,  Trachea midline normal ROM, No JVD, mass, thyromegaly, or carotid bruit present.  Cardiovascular: RRR, S1 normal, S2 normal, no MRG, pulses symmetric and intact bilaterally Pulmonary/Chest: CTAB, scattered rales. No wheezing or rhonchi Abdominal: Soft. Non-tender, non-distended, bowel sounds are normal, no masses, organomegaly, or guarding present.  Musculoskeletal: No joint deformities, erythema, or stiffness, ROM full and no nontender Hematology: no cervical, inginal, or axillary adenopathy.  Neurological: A&O x3, Strength is normal and symmetric bilaterally, cranial nerve II-XII are grossly intact, no focal motor deficit, sensory intact to light touch bilaterally.  Skin: Warm, dry and intact. No rash, cyanosis, or clubbing.     Labs on Admission:  Basic Metabolic Panel:  Lab 62/56/38 1507 10/03/11 1540  NA 137 134*  K 4.0 4.2  CL 103 105  CO2 24 25  GLUCOSE 101* 100*  BUN 13 20  CREATININE 1.06 1.06  CALCIUM 9.0 8.9  MG -- --  PHOS -- --   Liver Function Tests:  Lab 10/03/11 1540  AST 20  ALT 22  ALKPHOS 55  BILITOT 1.3*  PROT 6.3  ALBUMIN 3.8   No results found for this basename: LIPASE:5,AMYLASE:5 in the last 168 hours No results found for this basename: AMMONIA:5 in the last 168 hours CBC:  Lab 10/04/11 1507 10/03/11 1540  WBC 9.4 11.4*  NEUTROABS -- 8.3*  HGB 14.6 13.8  HCT 41.8 40.7  MCV 100.2* 101.2*  PLT 295 281   Cardiac Enzymes: No results found for this basename: CKTOTAL:5,CKMB:5,CKMBINDEX:5,TROPONINI:5 in the last 168 hours  BNP (last 3 results)  Basename 10/04/11 1508 10/16/10 1127  PROBNP 1676.0* 210.0*   CBG: No results found for this basename: GLUCAP:5 in the last 168 hours  Radiological Exams on Admission: Dg Chest 2 View  10/03/2011  *RADIOLOGY REPORT*  Clinical Data: Shortness of breath  CHEST - 2 VIEW  Comparison: Chest x-ray of 05/06/2011  Findings: No active infiltrate or effusion is seen.  Moderate cardiomegaly is noted.  There is  mild pulmonary vascular congestion present.  A defibrillator with AICD lead is noted.  No acute bony abnormality is seen.  IMPRESSION: Moderate cardiomegaly with mild pulmonary vascular congestion.   Original Report Authenticated By: Joretta Bachelor, M.D.     EKG: Paced at 62/min  Assessment/Plan Principal Problem:  *CHF (congestive heart failure) Active Problems:  OBSTRUCTIVE SLEEP APNEA  Atrial fibrillation  SYSTOLIC HEART FAILURE, CHRONIC   1. Acute on chronic systolic heart failure  - start the patient on IV lasix 30m Q8hr - cardiac enzymes/ 2 d echo - daily weights -I/o's -resume home losartan, coreg, spironolactone. - [facility]  2. Hemoptysis - most likely from excessive coughing and bronchitis with elevated INR.  - will hold coumadin tonight and check INR in am - start patient on Z pack - His hemoglobin is stable at 14.   3. Atrial fibrillation: rate controlled.  supratherapeutic INR - hold coumadin tonight and resume amidarone  4. DVT prophylaxis ; scd's  Code Status: full code Family Communication: wife at bedside Disposition Plan: home when medically stable  Time spent: 565min  [facility] Pager [phone] If 7PM-7AM, please contact night-coverage [email or web address] Password [email or web address]9/20/2013, 6:36 PM

## 2011-10-04 NOTE — ED Provider Notes (Signed)
History     CSN: [id number]  Arrival date & time 10/04/11  1457   First MD Initiated Contact with Patient 10/04/11 1554      Chief Complaint  Patient presents with  . Chest Pain  . Hemoptysis    (Consider location/radiation/quality/duration/timing/severity/associated sxs/prior treatment) The history is provided by the patient.  NOCHUM FENTER is a 71 y.o. male hx of afib on coumadin, HTN, HL, here with hemoptysis, SOB. He has been coughing since yesterday, today had two episodes of blood tinged sputum. He also has been feeling increasing SOB especially when he walks and laying down. His PMD increased his lasix this past week but he is not feeling better. No fever or chills, no abdominal pain or vomiting.    Past Medical History  Diagnosis Date  . Atrial fibrillation     Ablation for a-flutter 05/2000  . Cardiomyopathy     Ischemic.  EF 20%  . Hypertension   . Hyperlipidemia   . Allergy     rhinitis  . Obstructive sleep apnea     CPAP 16  . CAD (coronary artery disease)     PTCA RCA 1997 (Inf/post MI)  . Ventricular tachycardia     ICD, pacer  . Adenomatous colon polyp     Colonoscopy 07/2007---Dr. Watt Climes, [facility] (Rpt 5 yrs recommended)  . Kidney stones     No distal stones on CT 2010  . Myocardial infarction   . Microscopic hematuria   . Diverticulosis of colon 2005    Noted on Colonoscopies in 2005 and 2009 and on CT 2010  . Internal hemorrhoids     Colonoscopy 07/2007  . Hepatic steatosis 2010    Noted on CT abd 2010 and on u/s abd 04/2009 (transaminasemia)  . Allergic rhinitis   . BPH (benign prostatic hypertrophy)     PSA 04/15/07 was 1.44  . History of impaired glucose tolerance     Past Surgical History  Procedure Date  . [place] Contak Y801- Virl Axe, MD 07/25/06 (second device)  . Breast surgery 1990    "Tissue removed from breast"  . Umbilical hernia repair   . Hernia repair 1975    bilateral inguinal  . Extracorporeal shock wave  lithotripsy   . Atrial ablation surgery     Family History  Problem Relation Age of Onset  . Heart disease Mother   . Cancer Maternal Grandmother     breast  . Coronary artery disease Other     family hx of  . Hyperlipidemia Other     family hx of  . Heart attack Father     4 stents/ pacemaker  . Cancer Father     prostate  . Hypertension Sister   . Goiter Paternal Grandmother   . Nephrolithiasis Son     History  Substance Use Topics  . Smoking status: Former Smoker -- 2.0 packs/day for 15 years    Types: Cigarettes    Quit date: 01/15/1980  . Smokeless tobacco: Never Used  . Alcohol Use: No     former moderate drinker, none in 8 years      Review of Systems  Respiratory: Positive for cough and shortness of breath.   All other systems reviewed and are negative.    Allergies  Sulfa antibiotics and Amiodarone  Home Medications   Current Outpatient Rx  Name Route Sig Dispense Refill  . ALBUTEROL SULFATE HFA 108 (90 BASE) MCG/ACT IN AERS Inhalation Inhale  2 puffs into the lungs every 6 (six) hours as needed for wheezing. 1 Inhaler 2  . AMIODARONE HCL 200 MG PO TABS Oral Take 1 tablet (200 mg total) by mouth daily. 90 tablet 3  . CARVEDILOL 25 MG PO TABS Oral Take 1 tablet (25 mg total) by mouth 2 (two) times daily with a meal. 180 tablet 3  . EPLERENONE 25 MG PO TABS Oral Take 1 tablet (25 mg total) by mouth daily. 90 tablet 3  . FLUTICASONE PROPIONATE 50 MCG/ACT NA SUSP Nasal Place 2 sprays into the nose 2 (two) times daily. 16 g 12  . FUROSEMIDE 40 MG PO TABS Oral Take 80 mg by mouth daily.    [name] LEVOCETIRIZINE DIHYDROCHLORIDE 5 MG PO TABS Oral Take 1 tablet (5 mg total) by mouth every evening. 30 tablet 6  . LOSARTAN POTASSIUM 50 MG PO TABS Oral Take 1 tablet (50 mg total) by mouth daily. 30 tablet 12  . MEXILETINE HCL 150 MG PO CAPS Oral Take 300 mg by mouth 2 (two) times daily.    . MULTIPLE VITAMINS PO Oral Take 1 capsule by mouth daily.      [name] PRESERVISION  AREDS 2 PO Oral Take 1 tablet by mouth daily.      [name] NITROGLYCERIN 0.4 MG SL SUBL Sublingual Place 1 tablet (0.4 mg total) under the tongue every 5 (five) minutes as needed. 25 tablet 2  . WARFARIN SODIUM 5 MG PO TABS Oral Take 2.5-5 mg by mouth daily. Tue and Sat = 2.5 5 mg all other days    . POTASSIUM CHLORIDE CRYS ER 20 MEQ PO TBCR Oral Take 20 mEq by mouth daily.      BP 124/97  Pulse 63  Temp 97.7 F (36.5 C) (Oral)  Resp 18  SpO2 95%  Physical Exam  Nursing note and vitals reviewed. Constitutional: He is oriented to person, place, and time.       Chronically ill, anxious  HENT:  Head: Normocephalic.  Mouth/Throat: Oropharynx is clear and moist.  Eyes: Conjunctivae normal are normal. Pupils are equal, round, and reactive to light.  Neck: Normal range of motion. Neck supple.  Cardiovascular: Normal rate, regular rhythm and normal heart sounds.        + JVD  Pulmonary/Chest:       Slight tachypnea, + crackles bilateral bases  Abdominal: Soft. Bowel sounds are normal.  Musculoskeletal: Normal range of motion.       Trace edema  Neurological: He is alert and oriented to person, place, and time.  Skin: Skin is warm and dry.  Psychiatric: He has a normal mood and affect. His behavior is normal. Judgment and thought content normal.    ED Course  Procedures (including critical care time)  Labs Reviewed  CBC - Abnormal; Notable for the following:    RBC 4.17 (*)     MCV 100.2 (*)     MCH 35.0 (*)     All other components within normal limits  BASIC METABOLIC PANEL - Abnormal; Notable for the following:    Glucose, Bld 101 (*)     GFR calc non Af Amer 69 (*)     GFR calc Af Amer 80 (*)     All other components within normal limits  PRO B NATRIURETIC PEPTIDE - Abnormal; Notable for the following:    Pro B Natriuretic peptide (BNP) 1676.0 (*)     All other components within normal limits  PROTIME-INR - Abnormal; Notable for  the following:    Prothrombin Time 35.0 (*)       INR 3.77 (*)     All other components within normal limits  POCT I-STAT TROPONIN I   Dg Chest 2 View  10/03/2011  *RADIOLOGY REPORT*  Clinical Data: Shortness of breath  CHEST - 2 VIEW  Comparison: Chest x-ray of 05/06/2011  Findings: No active infiltrate or effusion is seen.  Moderate cardiomegaly is noted.  There is mild pulmonary vascular congestion present.  A defibrillator with AICD lead is noted.  No acute bony abnormality is seen.  IMPRESSION: Moderate cardiomegaly with mild pulmonary vascular congestion.   Original Report Authenticated By: Joretta Bachelor, M.D.      No diagnosis found.   Date: 10/04/2011  Rate: 60  Rhythm: paced  QRS Axis: normal  Intervals: normal  ST/T Wave abnormalities: normal  Conduction Disutrbances:none  Narrative Interpretation:   Old EKG Reviewed: unchanged    MDM  KORBIN MAPPS is a 71 y.o. male hx of afib on coumadin, HL, HTN, CHF here with SOB, hemoptysis. He likely has worsening heart failure and will require diuresis. Will check labs, BNP, CXR. Will need to admit for CHF exacerbation.   5:24 PM Patient's BNP 1676, elevated from baseline, INR 3.77. CBC, BMP at baseline. CXR showed moderate cardiomegaly with pulmonary congestion. He was given lasix 3m IV and admitted to tele under Dr. AKarleen Hampshire        DWandra Arthurs MD 10/04/11 [phone]

## 2011-10-04 NOTE — Telephone Encounter (Signed)
Patient called back, I offered him an appt at 3:45 this afternoon but he has decided to go to the ER instead.

## 2011-10-04 NOTE — ED Notes (Signed)
Pt c/o chest tightness and cough with bloody sputum starting today; pt sts had chest xray yesterday that showed increased fluid in lungs from CHF

## 2011-10-05 ENCOUNTER — Encounter (HOSPITAL_COMMUNITY): Payer: Self-pay | Admitting: *Deleted

## 2011-10-05 DIAGNOSIS — I5023 Acute on chronic systolic (congestive) heart failure: Principal | ICD-10-CM

## 2011-10-05 DIAGNOSIS — D6832 Hemorrhagic disorder due to extrinsic circulating anticoagulants: Secondary | ICD-10-CM

## 2011-10-05 DIAGNOSIS — R7989 Other specified abnormal findings of blood chemistry: Secondary | ICD-10-CM | POA: Diagnosis not present

## 2011-10-05 DIAGNOSIS — I509 Heart failure, unspecified: Secondary | ICD-10-CM

## 2011-10-05 DIAGNOSIS — I5021 Acute systolic (congestive) heart failure: Secondary | ICD-10-CM | POA: Diagnosis present

## 2011-10-05 LAB — PRO B NATRIURETIC PEPTIDE: Pro B Natriuretic peptide (BNP): 1907 pg/mL — ABNORMAL HIGH (ref 0–125)

## 2011-10-05 LAB — BASIC METABOLIC PANEL
BUN: 15 mg/dL (ref 6–23)
Chloride: 100 mEq/L (ref 96–112)
Creatinine, Ser: 1.07 mg/dL (ref 0.50–1.35)
GFR calc Af Amer: 79 mL/min — ABNORMAL LOW (ref >90)
GFR calc non Af Amer: 68 mL/min — ABNORMAL LOW (ref >90)
Potassium: 3.6 mEq/L (ref 3.5–5.1)

## 2011-10-05 LAB — CBC
HCT: 40.6 % (ref 39.0–52.0)
MCHC: 34 g/dL (ref 30.0–36.0)
Platelets: 287 10*3/uL (ref 150–400)
RDW: 13.6 % (ref 11.5–15.5)
WBC: 7.4 10*3/uL (ref 4.0–10.5)

## 2011-10-05 LAB — HEPATIC FUNCTION PANEL
AST: 19 U/L (ref 0–37)
Albumin: 3.5 g/dL (ref 3.5–5.2)
Alkaline Phosphatase: 65 U/L (ref 39–117)
Total Protein: 7.2 g/dL (ref 6.0–8.3)

## 2011-10-05 LAB — TROPONIN I
Troponin I: <0.3 ng/mL (ref <0.30)
Troponin I: <0.3 ng/mL (ref <0.30)

## 2011-10-05 LAB — TSH: TSH: 1.523 u[IU]/mL (ref 0.350–4.500)

## 2011-10-05 LAB — PROTIME-INR: Prothrombin Time: 37.5 seconds — ABNORMAL HIGH (ref 11.6–15.2)

## 2011-10-05 MED ORDER — FUROSEMIDE 80 MG PO TABS
80.0000 mg | ORAL_TABLET | Freq: Every day | ORAL | Status: DC
  Administered 2011-10-05 – 2011-10-06 (×2): 80 mg via ORAL
  Filled 2011-10-05 (×2): qty 1

## 2011-10-05 NOTE — Consult Note (Signed)
Cardiology Consult Note   Patient ID: [name] MRN: [id number], DOB/AGE: [birth year]   Admit date: 10/04/2011 Date of Consult: 10/05/2011  Primary Physician: [name], MD Primary Cardiologist: Jolyn Nap, MD (EP); Kirk Ruths, MD  Reason for consult: evaluation/management of CHF  HPI: [name] is a 71yo male with PMHx significant for CAD (inf MI s/p PTCA - RCA 1997; cath 2008 prox RCA occlusion with R-L collateralization; adenosine nuc stress- low risk, inferior wall scar, no ischemia, LVEF 19%), ischemic cardiomyopathy (echo 2011- LVEF 20-25%, anteroseptal, inferior and apical AK, severe LV dilatation, mod MR, mod LA dilatation, PASP 40 mmHg; s/p "ICD 12-15 years go" -> [employer] CRT-D 2011 c/b LV lead dislodgement), history of PAF (s/p RFA 2002; on chronic Coumadin), VT, HTN, HL and OSA who was admitted to [facility] on 10/04/11 with acute on chronic systolic CHF and bronchitis.   He reports experiencing increased fatigue, DOE, PND, abdominal distention and orthopnea for the past 7-10 days. He reports a mild, intermittent substernal chest tightness during this time. He attributes this to dietary indiscretion on his birthday, and while on a recent fishing trip. He notes that when he arrived home, he was ~ 8 - 10 lbs above his dry weight (noted to be 225 lbs). He reports typically eating well, watching his dietary salt intake and weighing himself daily. He compensated for this increase in weight by increasing his Lasix, and states he had began nearing his baseline weight once more after returning from his trip. He would not have presented to the ED had he not developed a blood-tinged cough. He states that he has a chronic cough productive of white sputum, but had never experienced blood in his sputum before. He denies n/v, fevers, chills, palpitations, lightheadedness, syncope, increased LE edema. Of note, he denies using his CPAP machine due to increase upper airway  secretions.  In the ED, EKG revealed A-V pacing at 70 bpm. Initial trop-I returned WNL. pBNP returned elevated at 606.6 > 1676. CXR revealed mod cardiomegaly with mild pulmonary vascular congestion. CBC revealed a mild leukocytosis at 11.4. Mild macrocytosis at 101.2. H/H & PLT stable. BMET revealed a very mild hyponatremia at 134. TSH WNL. D-dimer WNL. INR returned supratherapeutic at 3.77.   He was admitted by the medicine service and started on Lasix IV. Cardiac biomarkers were cycled and returned WNL x 3. He was resumed on ARB, Coreg and spironolactone. Coumadin was held in an effort to achieve therapeutic INR. He was started on a Z pack for suspected bronchitis. 2D echo pending. INR today up to 4.14. pBNP this AM 1907.   Problem List: Past Medical History  Diagnosis Date  . Atrial fibrillation     Ablation for a-flutter 05/2000  . Cardiomyopathy     Ischemic.  EF 20%  . Hypertension   . Hyperlipidemia   . Allergy     rhinitis  . Obstructive sleep apnea     CPAP 16  . CAD (coronary artery disease)     PTCA RCA 1997 (Inf/post MI)  . Ventricular tachycardia     ICD, pacer  . Adenomatous colon polyp     Colonoscopy 07/2007---Dr. Watt Climes, [facility] (Rpt 5 yrs recommended)  . Kidney stones     No distal stones on CT 2010  . Myocardial infarction   . Microscopic hematuria   . Diverticulosis of colon 2005    Noted on Colonoscopies in 2005 and 2009 and on CT 2010  . Internal hemorrhoids  Colonoscopy 07/2007  . Hepatic steatosis 2010    Noted on CT abd 2010 and on u/s abd 04/2009 (transaminasemia)  . Allergic rhinitis   . BPH (benign prostatic hypertrophy)     PSA 04/15/07 was 1.44  . History of impaired glucose tolerance     Past Surgical History  Procedure Date  . [place] Contak O709- Virl Axe, MD 07/25/06 (second device)  . Breast surgery 1990    "Tissue removed from breast"  . Umbilical hernia repair   . Hernia repair 1975    bilateral inguinal  . Extracorporeal  shock wave lithotripsy   . Atrial ablation surgery      Allergies:  Allergies  Allergen Reactions  . Sulfa Antibiotics Rash  . Amiodarone Other (See Comments)    Corneal deposits    Home Medications: Prior to Admission medications   Medication Sig Start Date End Date Taking? Authorizing Provider  albuterol (VENTOLIN HFA) 108 (90 BASE) MCG/ACT inhaler Inhale 2 puffs into the lungs every 6 (six) hours as needed for wheezing. 05/24/11 05/23/12 Yes [name], MD  amiodarone (PACERONE) 200 MG tablet Take 1 tablet (200 mg total) by mouth daily. 01/04/11  Yes [name], MD  carvedilol (COREG) 25 MG tablet Take 1 tablet (25 mg total) by mouth 2 (two) times daily with a meal. 01/04/11 01/04/12 Yes [name], MD  eplerenone (INSPRA) 25 MG tablet Take 1 tablet (25 mg total) by mouth daily. 01/04/11 01/04/12 Yes [name], MD  fluticasone (FLONASE) 50 MCG/ACT nasal spray Place 2 sprays into the nose 2 (two) times daily. 05/06/11 05/05/12 Yes Collene Gobble, MD  furosemide (LASIX) 40 MG tablet Take 80 mg by mouth daily.   Yes Historical Provider, MD  levocetirizine (XYZAL) 5 MG tablet Take 1 tablet (5 mg total) by mouth every evening. 04/19/11 04/18/12 Yes [name], MD  losartan (COZAAR) 50 MG tablet Take 1 tablet (50 mg total) by mouth daily. 07/09/11 07/08/12 Yes [name], MD  mexiletine (MEXITIL) 150 MG capsule Take 300 mg by mouth 2 (two) times daily.   Yes Historical Provider, MD  MULTIPLE VITAMINS PO Take 1 capsule by mouth daily.     Yes Historical Provider, MD  Multiple Vitamins-Minerals (PRESERVISION AREDS 2 PO) Take 1 tablet by mouth daily.     Yes Historical Provider, MD  nitroGLYCERIN (NITROSTAT) 0.4 MG SL tablet Place 1 tablet (0.4 mg total) under the tongue every 5 (five) minutes as needed. 06/19/10  Yes Deboraha Sprang, MD  warfarin (COUMADIN) 5 MG tablet Take 2.5-5 mg by mouth daily. Tue and Sat = 2.5 5 mg all other days 01/04/11  Yes [name], MD   potassium chloride SA (K-DUR,KLOR-CON) 20 MEQ tablet Take 20 mEq by mouth daily. 10/03/11   [name], MD    Inpatient Medications:     . amiodarone  200 mg Oral Daily  . azithromycin  500 mg Oral Daily   Followed by  . azithromycin  250 mg Oral Daily  . carvedilol  25 mg Oral BID WC  . carvedilol  25 mg Oral Once  . fluticasone  2 spray Each Nare BID  . furosemide  40 mg Intravenous Once  . furosemide  40 mg Intravenous Q8H  . loratadine  10 mg Oral Daily  . losartan  50 mg Oral Daily  . mexiletine  300 mg Oral BID  . potassium chloride SA  20 mEq  Oral Daily  . sodium chloride  3 mL Intravenous Q12H  . spironolactone  25 mg Oral Daily  . DISCONTD: levocetirizine  5 mg Oral QPM   Prescriptions prior to admission  Medication Sig Dispense Refill  . albuterol (VENTOLIN HFA) 108 (90 BASE) MCG/ACT inhaler Inhale 2 puffs into the lungs every 6 (six) hours as needed for wheezing.  1 Inhaler  2  . amiodarone (PACERONE) 200 MG tablet Take 1 tablet (200 mg total) by mouth daily.  90 tablet  3  . carvedilol (COREG) 25 MG tablet Take 1 tablet (25 mg total) by mouth 2 (two) times daily with a meal.  180 tablet  3  . eplerenone (INSPRA) 25 MG tablet Take 1 tablet (25 mg total) by mouth daily.  90 tablet  3  . fluticasone (FLONASE) 50 MCG/ACT nasal spray Place 2 sprays into the nose 2 (two) times daily.  16 g  12  . furosemide (LASIX) 40 MG tablet Take 80 mg by mouth daily.      [name] levocetirizine (XYZAL) 5 MG tablet Take 1 tablet (5 mg total) by mouth every evening.  30 tablet  6  . losartan (COZAAR) 50 MG tablet Take 1 tablet (50 mg total) by mouth daily.  30 tablet  12  . mexiletine (MEXITIL) 150 MG capsule Take 300 mg by mouth 2 (two) times daily.      . MULTIPLE VITAMINS PO Take 1 capsule by mouth daily.        . Multiple Vitamins-Minerals (PRESERVISION AREDS 2 PO) Take 1 tablet by mouth daily.        . nitroGLYCERIN (NITROSTAT) 0.4 MG SL tablet Place 1 tablet (0.4 mg total) under the  tongue every 5 (five) minutes as needed.  25 tablet  2  . warfarin (COUMADIN) 5 MG tablet Take 2.5-5 mg by mouth daily. Tue and Sat = 2.5 5 mg all other days      . potassium chloride SA (K-DUR,KLOR-CON) 20 MEQ tablet Take 20 mEq by mouth daily.        Family History  Problem Relation Age of Onset  . Heart disease Mother   . Cancer Maternal Grandmother     breast  . Coronary artery disease Other     family hx of  . Hyperlipidemia Other     family hx of  . Heart attack Father     4 stents/ pacemaker  . Cancer Father     prostate  . Hypertension Sister   . Goiter Paternal Grandmother   . Nephrolithiasis Son      History   Social History  . Marital Status: Married    Spouse Name: N/A    Number of Children: N/A  . Years of Education: N/A   Occupational History  . Retired    Social History Main Topics  . Smoking status: Former Smoker -- 2.0 packs/day for 15 years    Types: Cigarettes    Quit date: 01/15/1980  . Smokeless tobacco: Never Used  . Alcohol Use: No     former moderate drinker, none in 8 years  . Drug Use: No  . Sexually Active: Not on file   Other Topics Concern  . Not on file   Social History Narrative  . No narrative on file     Review of Systems: General: negative for chills, fever, night sweats or weight changes.  Cardiovascular: positive for chest pain, dyspnea on exertion, orthopnea, PND; negative for edema, palpitations Dermatological: negative for  rash Respiratory: positive for hemoptysis, negative for wheezing Urologic: negative for hematuria Abdominal: negative for nausea, vomiting, diarrhea, bright red blood per rectum, melena, or hematemesis Neurologic: negative for visual changes, syncope, or dizziness All other systems reviewed and are otherwise negative except as noted above.  Physical Exam: Blood pressure 107/70, pulse 57, temperature 97 F (36.1 C), temperature source Oral, resp. rate 18, height _0  (1.803 m), weight 102.241 kg  (225 lb 6.4 oz), SpO2 97.00%.    General: Obese, well developed, in no acute distress. Head: Normocephalic, atraumatic, sclera non-icteric, no xanthomas, nares are without discharge.  Neck: Negative for carotid bruits. JVD not elevated. Lungs: Clear bilaterally to auscultation without wheezes, rales, or rhonchi. Breathing is unlabored. Heart: RRR with S1 S2. No murmurs, rubs, or gallops appreciated. Abdomen: Soft, non-tender, non-distended with normoactive bowel sounds. No hepatomegaly. No rebound/guarding. No obvious abdominal masses. Msk:  Strength and tone appears normal for age. Extremities: No clubbing, cyanosis or edema.  Distal pedal pulses are 2+ and equal bilaterally. Neuro: Alert and oriented X 3. Moves all extremities spontaneously. Psych:  Responds to questions appropriately with a normal affect.  Labs: Recent Labs  Basename 10/05/11 0239 10/04/11 1507   WBC 7.4 9.4   HGB 13.8 14.6   HCT 40.6 41.8   MCV 100.5* 100.2*   PLT 287 295   Recent Labs  Basename 10/03/11 1544   DDIMER 0.27    Lab 10/05/11 0239 10/04/11 1507 10/03/11 1540  NA 136 137 134*  K 3.6 4.0 4.2  CL 100 103 105  CO2 _1 BUN _2 CREATININE 1.07 1.06 1.06  CALCIUM 8.9 9.0 8.9  PROT -- -- 6.3  BILITOT -- -- 1.3*  ALKPHOS -- -- 55  ALT -- -- 22  AST -- -- 20  AMYLASE -- -- --  LIPASE -- -- --  GLUCOSE 95 101* 100*   Recent Labs  Basename 10/05/11 0900 10/05/11 0239 10/04/11 1931   CKTOTAL -- -- --   CKMB -- -- --   CKMBINDEX -- -- --   TROPONINI <0.30 <0.30 <0.30   Basename 10/04/11 1931  TSH 1.523  T4TOTAL --  T3FREE --  THYROIDAB --    Radiology/Studies: Dg Chest 2 View  10/03/2011  *RADIOLOGY REPORT*  Clinical Data: Shortness of breath  CHEST - 2 VIEW  Comparison: Chest x-ray of 05/06/2011  Findings: No active infiltrate or effusion is seen.  Moderate cardiomegaly is noted.  There is mild pulmonary vascular congestion present.  A defibrillator with AICD lead is noted.   No acute bony abnormality is seen.  IMPRESSION: Moderate cardiomegaly with mild pulmonary vascular congestion.   Original Report Authenticated By: Joretta Bachelor, M.D.     EKG: A-V paced, 70 bpm  ASSESSMENT AND PLAN:   1. Acute on chronic systolic CHF/ischemic cardiomyopathy 2. Hemoptysis 3. Bronchitis 4. Elevated INR 5. CAD 6. History of proxysmal atrial fibrillation/flutter 7. History of VT 8. Hypertension 9. Hyperlipidemia 10. OSAa  DISCUSSION/PLAN:  From the HPI, the patient's CHF exacerbation is likely attributed to his recent dietary indiscretion. From the history, there is no endorsement of ischemic symptoms. Cardiac biomarkers since admission have been WNL. He is fairly regimented in his CHF management, and increased his Lasix accordingly once his weight increased. In fact, he began to near baseline with this increased diuresis. He is euvolemic on exam. He has diuresed ~ 2.6 L and is at his baseline weight (225 lbs). Will convert Lasix IV  to PO today. Continue current heart failure regimen otherwise.   His INR is increasing today despite Coumadin being held on admission. His INR had been relatively stable prior to this admission as evidenced by his prior Coumadin clinic visits. There has been some concern by Dr. Caryl Comes re: the patient's LFTs while on amiodarone. He has apparently been taken off statins for similar concerns. These labs were unremarkable on 09/19. Will obtain an additional set today. Continue to monitor INR while off Coumadin. Of note, azithromycin may interact with Coumadin to increase INR, but is noted to be the least likely of the macrolides to do so. Regarding the patient's hemoptysis, this is likely representative of underlying bronchitis (productive cough, leukocytosis on admission) and A/C CHF potentiated by Coumadin coagulopathy.   Encouraged the patient to use CPAP in an effort to reduce continued cardiac structural remodeling (macrocytosis noted on CBC as well).    Signed, R. Valeria Batman, PA-C 10/05/2011, 1:47 PM Patient seen and examined. I agree with the assessment and plan as detailed above. See also my additional thoughts below.    Cough with hemoptysis    At this point the small amount of hemoptysis has cleared. This may be related to his bronchitis. It does not appear that he needs a more aggressive workup at this time.   Warfarin-induced coagulopathy    The patient's INR was elevated when he was admitted. It was slightly higher the next day. His liver function studies were normal on admission. They will be repeated today. It is possible that the antibiotic has played a role in keeping his INR up. I normal be checked again tomorrow. There is no indication for vitamin K at this time.   Acute systolic CHF (congestive heart failure)   The patient's weight was close to baseline at home when he was admitted. However he has diuresed further and feels even better. He may have a new slightly lower dry weight.   Elevated LFTs   There has been a history of some elevation of LFTs in the past. They were not elevated on admission. We will check LFTs again. We have been very careful over time to reduce any medications that might affect his liver functions.   Dola Argyle, MD, [facility] 10/05/2011 4:21 PM

## 2011-10-05 NOTE — Progress Notes (Signed)
[facility] PROGRESS NOTE  [name] WXI:[id number] DOB: February 24, 1940 DOA: 10/04/2011 PCP: [name], MD  Assessment/Plan: Principal Problem:  *CHF (congestive heart failure) Active Problems:  OBSTRUCTIVE SLEEP APNEA  Atrial fibrillation  SYSTOLIC HEART FAILURE, CHRONIC  Cough with hemoptysis  1. Acute on chronic systolic heart failure:  - on iv lasix 40 mg Q8hr - Repeat Echo showed slight worsening of the LVEF when compared to echo in 2011.  - cardiac enzymes have been negative so far. - Cardiology consult called from [place] - resume home medications.  - he has diuresed > 2562m since admission. - continue lasix and follow him clinically.  2. Bronchitis with hemoptysis: - on Z pack - H&H stable  3. Chronic Atrial fibrillation ;  Controlled and on amiodarone. on anticoagulation at home which has been held for blood tinged sputum and for supra Therapeutic INR.   4. Ischemic Cardiomyopathy: on coreg, losartan. Patient denies any chest pain.   5. DVT prophylaxis: scd's.   Code Status: full code Family Communication: none at bedside Disposition Plan: possibly home soon.   Brief narrative:  Joel GHANis a 71y.o. male With sig hx of ischemic cardiomyopathy and atrial fibrillation on coumadin came for worsening sob, and Cough with blood tinged sputum twice since yesterday. He denies fever, or sick contacts. He has been coughing since 1 week, productive, with 2 episodes where he has blood tinged sputum. On arrival to ED, his INR was slightly elevated. He also reports that his lasix was recently increased from 80 mg to 120 mg per day. He continues to feel fatigued and short of breath with some chest tightness associated with it. No orthopnea, or PND, or pedal edema. He is being admitted to hospitalist service for evaluation of chf exacerbation and bronchitis.   Consultants:  cardiology  Procedures: CXR  Antibiotics:   Z pack  HPI/Subjective: Feeling  better, no hemoptysis  Objective: Filed Vitals:   10/04/11 2024 10/05/11 0136 10/05/11 0543 10/05/11 1411  BP: 104/68 99/63 107/70 96/62  Pulse: 56 66 57 60  Temp: 98 F (36.7 C) 97.7 F (36.5 C) 97 F (36.1 C) 97 F (36.1 C)  TempSrc: Oral Oral Oral Oral  Resp: _0 Height:      Weight:   102.241 kg (225 lb 6.4 oz)   SpO2: 96% 98% 97% 94%    Intake/Output Summary (Last 24 hours) at 10/05/11 1518 Last data filed at 10/05/11 1451  Gross per 24 hour  Intake   1060 ml  Output   3705 ml  Net  -2645 ml   Filed Weights   10/04/11 1832 10/05/11 0543  Weight: 102.6 kg (226 lb 3.1 oz) 102.241 kg (225 lb 6.4 oz)    Exam: General: Alert and awake, oriented x3, not in any acute distress, anxious.  HEENT: anicteric sclera, pupils reactive to light and accommodation, EOMI  CVS: S1-S2 clear,  Chest: clear to auscultation bilaterally, no wheezing, rales or rhonchi  Abdomen: soft nontender, nondistended, normal bowel sounds, no organomegaly  Extremities: no cyanosis, clubbing or edema noted bilaterally  Neuro: Cranial nerves II-XII intact, no focal neurological deficits     Data Reviewed: Basic Metabolic Panel:  Lab [id number] 10/04/11 1507 10/03/11 1540  NA 136 137 134*  K 3.6 4.0 4.2  CL 100 103 105  CO2 _1 GLUCOSE 95 101* 100*  BUN _2 CREATININE 1.07 1.06 1.06  CALCIUM 8.9 9.0 8.9  MG -- -- --  PHOS -- -- --   Liver Function Tests:  Lab 10/03/11 1540  AST 20  ALT 22  ALKPHOS 55  BILITOT 1.3*  PROT 6.3  ALBUMIN 3.8   No results found for this basename: LIPASE:5,AMYLASE:5 in the last 168 hours No results found for this basename: AMMONIA:5 in the last 168 hours CBC:  Lab 10/05/11 0239 10/04/11 1507 10/03/11 1540  WBC 7.4 9.4 11.4*  NEUTROABS -- -- 8.3*  HGB 13.8 14.6 13.8  HCT 40.6 41.8 40.7  MCV 100.5* 100.2* 101.2*  PLT 287 295 281   Cardiac Enzymes:  Lab 10/05/11 0900 10/05/11 0239 10/04/11 1931  CKTOTAL -- -- --  CKMB --  -- --  CKMBINDEX -- -- --  TROPONINI <0.30 <0.30 <0.30   BNP (last 3 results)  Basename 10/05/11 0239 10/04/11 1508 10/16/10 1127  PROBNP 1907.0* 1676.0* 210.0*   CBG: No results found for this basename: GLUCAP:5 in the last 168 hours  No results found for this or any previous visit (from the past 240 hour(s)).   Studies: Dg Chest 2 View  10/03/2011  *RADIOLOGY REPORT*  Clinical Data: Shortness of breath  CHEST - 2 VIEW  Comparison: Chest x-ray of 05/06/2011  Findings: No active infiltrate or effusion is seen.  Moderate cardiomegaly is noted.  There is mild pulmonary vascular congestion present.  A defibrillator with AICD lead is noted.  No acute bony abnormality is seen.  IMPRESSION: Moderate cardiomegaly with mild pulmonary vascular congestion.   Original Report Authenticated By: Joretta Bachelor, M.D.     Scheduled Meds:   . amiodarone  200 mg Oral Daily  . azithromycin  500 mg Oral Daily   Followed by  . azithromycin  250 mg Oral Daily  . carvedilol  25 mg Oral BID WC  . carvedilol  25 mg Oral Once  . fluticasone  2 spray Each Nare BID  . furosemide  40 mg Intravenous Once  . furosemide  40 mg Intravenous Q8H  . loratadine  10 mg Oral Daily  . losartan  50 mg Oral Daily  . mexiletine  300 mg Oral BID  . potassium chloride SA  20 mEq Oral Daily  . sodium chloride  3 mL Intravenous Q12H  . spironolactone  25 mg Oral Daily  . DISCONTD: levocetirizine  5 mg Oral QPM   Continuous Infusions:   Principal Problem:  *CHF (congestive heart failure) Active Problems:  OBSTRUCTIVE SLEEP APNEA  Atrial fibrillation  SYSTOLIC HEART FAILURE, CHRONIC  Cough with hemoptysis        [facility]  [facility] Pager [phone]. If 8PM-8AM, please contact night-coverage at [email or web address], password [facility] 10/05/2011, 3:18 PM  LOS: 1 day

## 2011-10-05 NOTE — Progress Notes (Signed)
  Echocardiogram 2D Echocardiogram has been performed.  [name] [name] 10/05/2011, 12:11 PM

## 2011-10-06 DIAGNOSIS — R0989 Other specified symptoms and signs involving the circulatory and respiratory systems: Secondary | ICD-10-CM

## 2011-10-06 DIAGNOSIS — R0609 Other forms of dyspnea: Secondary | ICD-10-CM

## 2011-10-06 LAB — CBC
HCT: 42.3 % (ref 39.0–52.0)
MCH: 34.9 pg — ABNORMAL HIGH (ref 26.0–34.0)
MCV: 100.5 fL — ABNORMAL HIGH (ref 78.0–100.0)
RDW: 13.7 % (ref 11.5–15.5)
WBC: 10.7 10*3/uL — ABNORMAL HIGH (ref 4.0–10.5)

## 2011-10-06 LAB — BASIC METABOLIC PANEL
BUN: 17 mg/dL (ref 6–23)
CO2: 25 mEq/L (ref 19–32)
Calcium: 9.2 mg/dL (ref 8.4–10.5)
Chloride: 100 mEq/L (ref 96–112)
Creatinine, Ser: 0.96 mg/dL (ref 0.50–1.35)
GFR calc Af Amer: >90 mL/min (ref >90)

## 2011-10-06 MED ORDER — POTASSIUM CHLORIDE CRYS ER 20 MEQ PO TBCR
40.0000 meq | EXTENDED_RELEASE_TABLET | Freq: Once | ORAL | Status: AC
  Administered 2011-10-06: 40 meq via ORAL
  Filled 2011-10-06: qty 2

## 2011-10-06 MED ORDER — AZITHROMYCIN 250 MG PO TABS: ORAL_TABLET | ORAL | Status: DC

## 2011-10-06 NOTE — Progress Notes (Signed)
Patient ID: [name], male   DOB: September 05, 1940, 71 y.o.   MRN: [id number]   SUBJECTIVE  The patient has improved significantly. There may be continued slight blood tinging of his sputum. He is pleased that his INR is beginning to come down.   Filed Vitals:   10/05/11 0543 10/05/11 1411 10/05/11 2021 10/06/11 0509  BP: 107/70 96/62 108/69 107/67  Pulse: 57 60 58 60  Temp: 97 F (36.1 C) 97 F (36.1 C) 97.6 F (36.4 C) 98.1 F (36.7 C)  TempSrc: Oral Oral Oral Oral  Resp: _0 Height:      Weight: 225 lb 6.4 oz (102.241 kg)   224 lb 1.6 oz (101.651 kg)  SpO2: 97% 94% 95% 94%    Intake/Output Summary (Last 24 hours) at 10/06/11 1057 Last data filed at 10/06/11 0918  Gross per 24 hour  Intake   1280 ml  Output   1250 ml  Net     30 ml    LABS: Basic Metabolic Panel:  Basename 10/06/11 0455 10/05/11 0239  NA 137 136  K 3.4* 3.6  CL 100 100  CO2 25 25  GLUCOSE 93 95  BUN 17 15  CREATININE 0.96 1.07  CALCIUM 9.2 8.9  MG -- --  PHOS -- --   Liver Function Tests:  Basename 10/05/11 1605 10/03/11 1540  AST 19 20  ALT 21 22  ALKPHOS 65 55  BILITOT 0.9 1.3*  PROT 7.2 6.3  ALBUMIN 3.5 3.8   No results found for this basename: LIPASE:2,AMYLASE:2 in the last 72 hours CBC:  Basename 10/06/11 0455 10/05/11 0239 10/03/11 1540  WBC 10.7* 7.4 --  NEUTROABS -- -- 8.3*  HGB 14.7 13.8 --  HCT 42.3 40.6 --  MCV 100.5* 100.5* --  PLT 319 287 --   Cardiac Enzymes:  Basename 10/05/11 0900 10/05/11 0239 10/04/11 1931  CKTOTAL -- -- --  CKMB -- -- --  CKMBINDEX -- -- --  TROPONINI <0.30 <0.30 <0.30   BNP: No components found with this basename: POCBNP:3 D-Dimer:  Basename 10/03/11 1544  DDIMER 0.27   Hemoglobin A1C: No results found for this basename: HGBA1C in the last 72 hours Fasting Lipid Panel: No results found for this basename: CHOL,HDL,LDLCALC,TRIG,CHOLHDL,LDLDIRECT in the last 72 hours Thyroid Function Tests:  Basename 10/04/11 1931  TSH  1.523  T4TOTAL --  T3FREE --  THYROIDAB --    RADIOLOGY: Dg Chest 2 View  10/03/2011  *RADIOLOGY REPORT*  Clinical Data: Shortness of breath  CHEST - 2 VIEW  Comparison: Chest x-ray of 05/06/2011  Findings: No active infiltrate or effusion is seen.  Moderate cardiomegaly is noted.  There is mild pulmonary vascular congestion present.  A defibrillator with AICD lead is noted.  No acute bony abnormality is seen.  IMPRESSION: Moderate cardiomegaly with mild pulmonary vascular congestion.   Original Report Authenticated By: Joretta Bachelor, M.D.     PHYSICAL EXAM Patient is oriented to person time and place. Affect is normal. Lungs reveal scattered rhonchi. Cardiac exam reveals S1 and S2. There no clicks or significant murmurs. The abdomen is soft. There is no peripheral edema.   TELEMETRY: I reviewed telemetry today October 06, 2011. The rhythm is stable.   ASSESSMENT AND PLAN:   *CHF (congestive heart failure)   He has diuresed a little further. Renal function remained stable.His CHF is now compensated. He is ready to go home on his usual home CHF medications.   Atrial fibrillation  Rate remained stable.    Cough with hemoptysis    He is receiving azithromycin for an upper respiratory infection. The slight hemoptysis resolved rapidly. He asked me about the dosing of the antibiotic. It will be important that he gets a complete course of azithromycin.   Warfarin-induced coagulopathy    INR is down to 2.9 today. Is not completely clear why it bumped up a little before coming down. Possibly this is related to the antibiotics. His liver function studies remain normal. I feel it is safe for him to now have his Coumadin followed carefully as an outpatient in the Coumadin clinic.I would suggest holding his Coumadin again today. I will asked the Coumadin clinic to call him tomorrow to arrange the timing of his next visit.   Elevated LFTs   There is a history of some LFT elevation. However  checked again here in the hospital his LFTs look good.  Hypokalemia Potassium was down to 3.4 today. I will give him one extra dose of 40 mEq of potassium today. He would then continue on his usual home dose.  The patient is stable. He can be discharged home.He has an appointment to see Dr. Stanford Breed Later this week.  Dola Argyle 10/06/2011 10:57 AM

## 2011-10-06 NOTE — Discharge Summary (Signed)
Physician Discharge Summary  [name] ZOX:[id number] DOB: [birth year] DOA: 10/04/2011  PCP: [name], MD  Admit date: 10/04/2011 Discharge date: 10/06/2011  Recommendations for Outpatient Follow-up:  1. Hold tonight dose of coumadin and check INR in am and dose coumadin from tomorrow.   Discharge Diagnoses:  Principal Problem:  *CHF (congestive heart failure) Active Problems:  OBSTRUCTIVE SLEEP APNEA  Atrial fibrillation  SYSTOLIC HEART FAILURE, CHRONIC  Cough with hemoptysis  Warfarin-induced coagulopathy  Acute systolic CHF (congestive heart failure)  Elevated LFTs   Discharge Condition: stable  Diet recommendation: low sodium diet.   Filed Weights   10/04/11 1832 10/05/11 0543 10/06/11 0509  Weight: 102.6 kg (226 lb 3.1 oz) 102.241 kg (225 lb 6.4 oz) 101.651 kg (224 lb 1.6 oz)    History of present illness:  [name] is a 71 y.o. male With sig hx of ischemic cardiomyopathy and atrial fibrillation on coumadin came for worsening sob, and Cough with blood tinged sputum twice since yesterday. He denies fever, or sick contacts. He has been coughing since 1 week, productive, with 2 episodes where he has blood tinged sputum. On arrival to ED, his INR was slightly elevated. He also reports that his lasix was recently increased from 80 mg to 120 mg per day. He continues to feel fatigued and short of breath with some chest tightness associated with it. No orthopnea, or PND, or pedal edema. He is being admitted to hospitalist service for evaluation of chf exacerbation and bronchitis.    Hospital Course:  1. Acute on chronic systolic heart failure:  Improved on discharge. He was initially started on iv lasix 40 mg  Q8hr. He has appropriately diuresed, meanwhile cardiology consult was called. Recommended no further work up as he was at his baseline on discharge. Meanwhile his enzymes have been negative and his EKG is unchanged.  - Repeat Echo showed slight worsening of the  LVEF when compared to echo in 2011.   2. Bronchitis with hemoptysis: Initially when he came in he had hemoptysis with supra therapeutic INR. His hemoptysis was most likely from persistent cough with bronchitis. He was started on z pack and his coumadin was held. His coagulopathy improved and his hemoptysis resolved.  His hemoglobin has been stable since then.   3. Chronic Atrial fibrillation ; Controlled and on amiodarone. on anticoagulation at home which has been held for blood tinged sputum and for supra Therapeutic INR.  On discharge his INR became therapeutic .   4. Ischemic Cardiomyopathy: on coreg, losartan. Patient denies any chest pain   Procedures:  2 D echo.  Consultations:  CARDIOLOGY    Discharge Exam: Filed Vitals:   10/05/11 0543 10/05/11 1411 10/05/11 2021 10/06/11 0509  BP: 107/70 96/62 108/69 107/67  Pulse: 57 60 58 60  Temp: 97 F (36.1 C) 97 F (36.1 C) 97.6 F (36.4 C) 98.1 F (36.7 C)  TempSrc: Oral Oral Oral Oral  Resp: _0 Height:      Weight: 102.241 kg (225 lb 6.4 oz)   101.651 kg (224 lb 1.6 oz)  SpO2: 97% 94% 95% 94%   Exam:  General: Alert and awake, oriented x3, not in any acute distress, anxious.  HEENT: anicteric sclera, pupils reactive to light and accommodation, EOMI  CVS: S1-S2 clear,  Chest: clear to auscultation bilaterally, no wheezing, rales or rhonchi  Abdomen: soft nontender, nondistended, normal bowel sounds, no organomegaly  Extremities: no cyanosis, clubbing or edema noted bilaterally  Neuro:  Cranial nerves II-XII intact, no focal neurological deficits    Discharge Instructions  Discharge Orders    Future Appointments: Provider: Department: Dept Phone: Center:   10/07/2011 11:00 AM [facility] [place] [phone] None   10/10/2011 10:15 AM [name], [place] [phone] [facility]   10/10/2011 11:00 AM [name], [place] [phone] None   10/22/2011 11:30 AM  [facility] [phone] None   10/25/2011 10:30 AM Deboraha Sprang, MD [place] [phone] [facility]   12/04/2011 1:15 PM [name], MD [place] [phone] None   01/28/2012 11:00 AM Kathee Delton, MD [facility] [phone] None   05/21/2012 11:15 AM Kathee Delton, MD [facility] [phone] None     Future Orders Please Complete By Expires   Diet - low sodium heart healthy      Discharge instructions      Comments:   Follow up with your cardiologist as recommended   Activity as tolerated - No restrictions          Medication List     As of 10/06/2011  6:07 PM    TAKE these medications         albuterol 108 (90 BASE) MCG/ACT inhaler   Commonly known as: PROVENTIL HFA;VENTOLIN HFA   Inhale 2 puffs into the lungs every 6 (six) hours as needed for wheezing.      amiodarone 200 MG tablet   Commonly known as: PACERONE   Take 1 tablet (200 mg total) by mouth daily.      azithromycin 250 MG tablet   Commonly known as: ZITHROMAX   Completing a course of z pack.      carvedilol 25 MG tablet   Commonly known as: COREG   Take 1 tablet (25 mg total) by mouth 2 (two) times daily with a meal.      eplerenone 25 MG tablet   Commonly known as: INSPRA   Take 1 tablet (25 mg total) by mouth daily.      fluticasone 50 MCG/ACT nasal spray   Commonly known as: FLONASE   Place 2 sprays into the nose 2 (two) times daily.      furosemide 40 MG tablet   Commonly known as: LASIX   Take 80 mg by mouth daily.      levocetirizine 5 MG tablet   Commonly known as: XYZAL   Take 1 tablet (5 mg total) by mouth every evening.      losartan 50 MG tablet   Commonly known as: COZAAR   Take 1 tablet (50 mg total) by mouth daily.      mexiletine 150 MG capsule   Commonly known as: MEXITIL   Take 300 mg by mouth 2 (two) times daily.      MULTIPLE VITAMINS PO   Take 1 capsule by mouth daily.       nitroGLYCERIN 0.4 MG SL tablet   Commonly known as: NITROSTAT   Place 1 tablet (0.4 mg total) under the tongue every 5 (five) minutes as needed.      potassium chloride SA 20 MEQ tablet   Commonly known as: K-DUR,KLOR-CON   Take 20 mEq by mouth daily.      PRESERVISION AREDS 2 PO   Take 1 tablet by mouth daily.      warfarin 5 MG tablet   Commonly known as: COUMADIN   Take 2.5-5 mg by mouth daily. Tue and Sat = 2.5  5 mg all other days          The results of significant diagnostics from this hospitalization (including imaging, microbiology, ancillary and laboratory) are listed below for reference.    Significant Diagnostic Studies: Dg Chest 2 View  10/03/2011  *RADIOLOGY REPORT*  Clinical Data: Shortness of breath  CHEST - 2 VIEW  Comparison: Chest x-ray of 05/06/2011  Findings: No active infiltrate or effusion is seen.  Moderate cardiomegaly is noted.  There is mild pulmonary vascular congestion present.  A defibrillator with AICD lead is noted.  No acute bony abnormality is seen.  IMPRESSION: Moderate cardiomegaly with mild pulmonary vascular congestion.   Original Report Authenticated By: Joretta Bachelor, M.D.     Microbiology: No results found for this or any previous visit (from the past 240 hour(s)).   Labs: Basic Metabolic Panel:  Lab 14/23/95 0455 10/05/11 0239 10/04/11 1507 10/03/11 1540  NA 137 136 137 134*  K 3.4* 3.6 4.0 4.2  CL 100 100 103 105  CO2 _0 GLUCOSE 93 95 101* 100*  BUN _1 CREATININE 0.96 1.07 1.06 1.06  CALCIUM 9.2 8.9 9.0 8.9  MG -- -- -- --  PHOS -- -- -- --   Liver Function Tests:  Lab 10/05/11 1605 10/03/11 1540  AST 19 20  ALT 21 22  ALKPHOS 65 55  BILITOT 0.9 1.3*  PROT 7.2 6.3  ALBUMIN 3.5 3.8   No results found for this basename: LIPASE:5,AMYLASE:5 in the last 168 hours No results found for this basename: AMMONIA:5 in the last 168 hours CBC:  Lab 10/06/11 0455 10/05/11 0239 10/04/11 1507 10/03/11 1540    WBC 10.7* 7.4 9.4 11.4*  NEUTROABS -- -- -- 8.3*  HGB 14.7 13.8 14.6 13.8  HCT 42.3 40.6 41.8 40.7  MCV 100.5* 100.5* 100.2* 101.2*  PLT 319 287 295 281   Cardiac Enzymes:  Lab 10/05/11 0900 10/05/11 0239 10/04/11 1931  CKTOTAL -- -- --  CKMB -- -- --  CKMBINDEX -- -- --  TROPONINI <0.30 <0.30 <0.30   BNP: BNP (last 3 results)  Basename 10/05/11 0239 10/04/11 1508 10/16/10 1127  PROBNP 1907.0* 1676.0* 210.0*   CBG: No results found for this basename: GLUCAP:5 in the last 168 hours  Time coordinating discharge: 45 minutes  Signed:  Sanita Estrada  [facility] 10/06/2011, 6:07 PM

## 2011-10-07 ENCOUNTER — Other Ambulatory Visit (INDEPENDENT_AMBULATORY_CARE_PROVIDER_SITE_OTHER): Payer: Medicare Other

## 2011-10-07 DIAGNOSIS — E876 Hypokalemia: Secondary | ICD-10-CM

## 2011-10-08 LAB — BASIC METABOLIC PANEL
BUN: 22 mg/dL (ref 6–23)
Chloride: 104 mEq/L (ref 96–112)
Glucose, Bld: 141 mg/dL — ABNORMAL HIGH (ref 70–99)
Potassium: 4.5 mEq/L (ref 3.5–5.1)

## 2011-10-09 ENCOUNTER — Other Ambulatory Visit: Payer: Self-pay | Admitting: *Deleted

## 2011-10-09 NOTE — Telephone Encounter (Signed)
Requested refills on VENTOLIN AND FLONASE.  Per Clarise Cruz at [facility], pt has refills on both.  She will process for patient.  No refills at this time.

## 2011-10-10 ENCOUNTER — Ambulatory Visit (INDEPENDENT_AMBULATORY_CARE_PROVIDER_SITE_OTHER): Payer: Medicare Other | Admitting: *Deleted

## 2011-10-10 ENCOUNTER — Ambulatory Visit (INDEPENDENT_AMBULATORY_CARE_PROVIDER_SITE_OTHER): Payer: Medicare Other | Admitting: Cardiology

## 2011-10-10 ENCOUNTER — Ambulatory Visit: Payer: Medicare Other | Admitting: Family Medicine

## 2011-10-10 ENCOUNTER — Encounter: Payer: Self-pay | Admitting: Cardiology

## 2011-10-10 VITALS — BP 108/77 | HR 60 | Wt 228.0 lb

## 2011-10-10 DIAGNOSIS — I2510 Atherosclerotic heart disease of native coronary artery without angina pectoris: Secondary | ICD-10-CM | POA: Diagnosis not present

## 2011-10-10 DIAGNOSIS — I2589 Other forms of chronic ischemic heart disease: Secondary | ICD-10-CM

## 2011-10-10 DIAGNOSIS — I4891 Unspecified atrial fibrillation: Secondary | ICD-10-CM | POA: Diagnosis not present

## 2011-10-10 DIAGNOSIS — Z7901 Long term (current) use of anticoagulants: Secondary | ICD-10-CM

## 2011-10-10 DIAGNOSIS — I255 Ischemic cardiomyopathy: Secondary | ICD-10-CM

## 2011-10-10 MED ORDER — FUROSEMIDE 40 MG PO TABS: 80.0000 mg | ORAL_TABLET | Freq: Every day | ORAL | Status: DC

## 2011-10-10 MED ORDER — VALSARTAN 80 MG PO TABS: 80.0000 mg | ORAL_TABLET | Freq: Every day | ORAL | Status: DC

## 2011-10-10 NOTE — Progress Notes (Signed)
HPI: [name] is a pleasant gentleman with a history of coronary artery disease, mixed ischemic and nonischemic cardiomyopathy, history of ICD, paroxysmal atrial fibrillation, and ventricular tachycardia. Previous carotid Dopplers in April 2006 showed 0-39% stenosis bilaterally. Previous abdominal ultrasound in April 2006 showed no aneurysm. Last cardiac catheterization in October 2008 showed an occluded right coronary artery with left-to-right collateralization. Ejection fraction is 20%. There was no other coronary disease noted. Last Myoview in Dec 2011 showed prior inferior infarct but no ischemia. His ejection fraction was 19%. ABIs in December of 2011 were normal. An echocardiogram in Sept 2013 showed an ejection fraction of 15-20%, moderate to severe mitral regurgitation, moderate to severe left atrial enlargement, mild right ventricular and right atrial enlargement. The patient has had upgrade of his device to CRT-D. Note chest x-ray in September of 2013 showed moderate cardiomegaly and mild pulmonary vascular congestion. LFTs normal. TSH normal. Patient recently admitted with shortness of breath. Enzymes were negative. He was also felt to have bronchitis. His diuretics were increased and he was treated with antibiotics. He is improving. He has mild dyspnea on exertion but no orthopnea, PND, pedal edema, syncope or chest pain. He discontinued his Cozaar as it was causing side effects.   Current Outpatient Prescriptions  Medication Sig Dispense Refill  . albuterol (VENTOLIN HFA) 108 (90 BASE) MCG/ACT inhaler Inhale 2 puffs into the lungs every 6 (six) hours as needed for wheezing.  1 Inhaler  2  . amiodarone (PACERONE) 200 MG tablet Take 1 tablet (200 mg total) by mouth daily.  90 tablet  3  . carvedilol (COREG) 25 MG tablet Take 1 tablet (25 mg total) by mouth 2 (two) times daily with a meal.  180 tablet  3  . eplerenone (INSPRA) 25 MG tablet Take 1 tablet (25 mg total) by mouth daily.  90  tablet  3  . fluticasone (FLONASE) 50 MCG/ACT nasal spray Place 2 sprays into the nose 2 (two) times daily.  16 g  12  . furosemide (LASIX) 40 MG tablet Take 80 mg by mouth daily.      [name] levocetirizine (XYZAL) 5 MG tablet Take 1 tablet (5 mg total) by mouth every evening.  30 tablet  6  . losartan (COZAAR) 50 MG tablet Take 1 tablet (50 mg total) by mouth daily.  30 tablet  12  . mexiletine (MEXITIL) 150 MG capsule Take 300 mg by mouth 2 (two) times daily.      . MULTIPLE VITAMINS PO Take 1 capsule by mouth daily.        . Multiple Vitamins-Minerals (PRESERVISION AREDS 2 PO) Take 1 tablet by mouth daily.        . nitroGLYCERIN (NITROSTAT) 0.4 MG SL tablet Place 1 tablet (0.4 mg total) under the tongue every 5 (five) minutes as needed.  25 tablet  2  . potassium chloride SA (K-DUR,KLOR-CON) 20 MEQ tablet Take 20 mEq by mouth daily.      [name] warfarin (COUMADIN) 5 MG tablet Take 2.5-5 mg by mouth daily. Tue and Sat = 2.5 5 mg all other days         Past Medical History  Diagnosis Date  . Atrial fibrillation     Ablation for a-flutter 05/2000  . Cardiomyopathy     Ischemic.  EF 20%  . Hypertension   . Hyperlipidemia   . Allergy     rhinitis  . Obstructive sleep apnea     CPAP 16  . CAD (coronary artery  disease)     PTCA RCA 1997 (Inf/post MI)  . Ventricular tachycardia     ICD, pacer  . Adenomatous colon polyp     Colonoscopy 07/2007---Dr. Watt Climes, [facility] (Rpt 5 yrs recommended)  . Kidney stones     No distal stones on CT 2010  . Myocardial infarction   . Microscopic hematuria   . Diverticulosis of colon 2005    Noted on Colonoscopies in 2005 and 2009 and on CT 2010  . Internal hemorrhoids     Colonoscopy 07/2007  . Hepatic steatosis 2010    Noted on CT abd 2010 and on u/s abd 04/2009 (transaminasemia)  . Allergic rhinitis   . BPH (benign prostatic hypertrophy)     PSA 04/15/07 was 1.44  . History of impaired glucose tolerance     Past Surgical History  Procedure Date  . Icd       a) Guidant Contak H170- Virl Axe, MD 07/25/06 (second device) b) Medtronic CRT-D  . Breast surgery 1990    "Tissue removed from breast"  . Umbilical hernia repair   . Hernia repair 1975    bilateral inguinal  . Extracorporeal shock wave lithotripsy   . Atrial ablation surgery     History   Social History  . Marital Status: Married    Spouse Name: N/A    Number of Children: N/A  . Years of Education: N/A   Occupational History  . Retired    Social History Main Topics  . Smoking status: Former Smoker -- 2.0 packs/day for 15 years    Types: Cigarettes    Quit date: 01/15/1980  . Smokeless tobacco: Never Used  . Alcohol Use: No     former moderate drinker, none in 8 years  . Drug Use: No  . Sexually Active: Not on file   Other Topics Concern  . Not on file   Social History Narrative  . No narrative on file    ROS: no fevers or chills, productive cough, hemoptysis, dysphasia, odynophagia, melena, hematochezia, dysuria, hematuria, rash, seizure activity, orthopnea, PND, pedal edema, claudication. Remaining systems are negative.  Physical Exam: Well-developed well-nourished in no acute distress.  Skin is warm and dry.  HEENT is normal.  Neck is supple.  Chest is clear to auscultation with normal expansion.  Cardiovascular exam is regular rate and rhythm.  Abdominal exam nontender or distended. No masses palpated. Extremities show no edema. neuro grossly intact

## 2011-10-10 NOTE — Assessment & Plan Note (Signed)
Not on aspirin given need for Coumadin. Not on statin as it has caused increased liver functions in association with amiodarone.

## 2011-10-10 NOTE — Assessment & Plan Note (Signed)
Management per electrophysiology. 

## 2011-10-10 NOTE — Assessment & Plan Note (Signed)
Blood pressure controlled.

## 2011-10-10 NOTE — Assessment & Plan Note (Signed)
Continued amiodarone and Coumadin. 

## 2011-10-10 NOTE — Assessment & Plan Note (Signed)
Continue amiodarone and mexiletine. ICD in place.

## 2011-10-10 NOTE — Patient Instructions (Addendum)
Your physician recommends that you schedule a follow-up appointment in: [place]  Your physician recommends that you return for lab work in: [place]

## 2011-10-10 NOTE — Assessment & Plan Note (Signed)
Continue beta blocker. Patient not taking Cozaar as he had side effects. Resume Diovan at 80 mg daily.

## 2011-10-10 NOTE — Assessment & Plan Note (Signed)
Patient is improving following recent admission.continue present dose of Lasix. Check potassium and renal function in one week.

## 2011-10-17 ENCOUNTER — Other Ambulatory Visit: Payer: Self-pay | Admitting: *Deleted

## 2011-10-17 DIAGNOSIS — I10 Essential (primary) hypertension: Secondary | ICD-10-CM

## 2011-10-17 DIAGNOSIS — I2589 Other forms of chronic ischemic heart disease: Secondary | ICD-10-CM

## 2011-10-17 DIAGNOSIS — E785 Hyperlipidemia, unspecified: Secondary | ICD-10-CM

## 2011-10-18 ENCOUNTER — Other Ambulatory Visit (INDEPENDENT_AMBULATORY_CARE_PROVIDER_SITE_OTHER): Payer: Medicare Other

## 2011-10-18 ENCOUNTER — Ambulatory Visit (INDEPENDENT_AMBULATORY_CARE_PROVIDER_SITE_OTHER): Payer: Medicare Other | Admitting: *Deleted

## 2011-10-18 DIAGNOSIS — I4891 Unspecified atrial fibrillation: Secondary | ICD-10-CM

## 2011-10-18 DIAGNOSIS — Z7901 Long term (current) use of anticoagulants: Secondary | ICD-10-CM | POA: Diagnosis not present

## 2011-10-18 DIAGNOSIS — I10 Essential (primary) hypertension: Secondary | ICD-10-CM

## 2011-10-18 DIAGNOSIS — I2589 Other forms of chronic ischemic heart disease: Secondary | ICD-10-CM | POA: Diagnosis not present

## 2011-10-18 DIAGNOSIS — E785 Hyperlipidemia, unspecified: Secondary | ICD-10-CM

## 2011-10-18 DIAGNOSIS — I255 Ischemic cardiomyopathy: Secondary | ICD-10-CM

## 2011-10-18 LAB — BASIC METABOLIC PANEL
BUN: 15 mg/dL (ref 6–23)
CO2: 28 mEq/L (ref 19–32)
Chloride: 103 mEq/L (ref 96–112)
Creatinine, Ser: 1 mg/dL (ref 0.4–1.5)

## 2011-10-25 ENCOUNTER — Encounter: Payer: Self-pay | Admitting: Internal Medicine

## 2011-10-25 ENCOUNTER — Ambulatory Visit (INDEPENDENT_AMBULATORY_CARE_PROVIDER_SITE_OTHER): Payer: Medicare Other | Admitting: Internal Medicine

## 2011-10-25 ENCOUNTER — Ambulatory Visit (INDEPENDENT_AMBULATORY_CARE_PROVIDER_SITE_OTHER): Payer: Medicare Other | Admitting: *Deleted

## 2011-10-25 VITALS — BP 96/68 | HR 60 | Ht 71.0 in | Wt 230.0 lb

## 2011-10-25 DIAGNOSIS — I4891 Unspecified atrial fibrillation: Secondary | ICD-10-CM | POA: Diagnosis not present

## 2011-10-25 DIAGNOSIS — I4729 Other ventricular tachycardia: Secondary | ICD-10-CM | POA: Diagnosis not present

## 2011-10-25 DIAGNOSIS — I2589 Other forms of chronic ischemic heart disease: Secondary | ICD-10-CM | POA: Diagnosis not present

## 2011-10-25 DIAGNOSIS — Z95810 Presence of automatic (implantable) cardiac defibrillator: Secondary | ICD-10-CM

## 2011-10-25 DIAGNOSIS — I5022 Chronic systolic (congestive) heart failure: Secondary | ICD-10-CM

## 2011-10-25 DIAGNOSIS — I472 Ventricular tachycardia: Secondary | ICD-10-CM | POA: Diagnosis not present

## 2011-10-25 DIAGNOSIS — Z7901 Long term (current) use of anticoagulants: Secondary | ICD-10-CM

## 2011-10-25 DIAGNOSIS — I4589 Other specified conduction disorders: Secondary | ICD-10-CM | POA: Insufficient documentation

## 2011-10-25 DIAGNOSIS — I509 Heart failure, unspecified: Secondary | ICD-10-CM | POA: Diagnosis not present

## 2011-10-25 LAB — ICD DEVICE OBSERVATION
AL AMPLITUDE: 2.3 mv
RV LEAD AMPLITUDE: 7.3 mv
RV LEAD THRESHOLD: 1.1 V
TZAT-0001FASTVT: 2
TZAT-0001SLOWVT: 2
TZAT-0013SLOWVT: 4
TZAT-0018FASTVT: NEGATIVE
TZAT-0018SLOWVT: NEGATIVE
TZON-0003SLOWVT: 600 ms
TZST-0001FASTVT: 7
TZST-0001FASTVT: 8
TZST-0001SLOWVT: 4
TZST-0001SLOWVT: 5
TZST-0001SLOWVT: 7
TZST-0002SLOWVT: NEGATIVE
TZST-0002SLOWVT: NEGATIVE
TZST-0003FASTVT: 41 J
TZST-0003FASTVT: 41 J
TZST-0003FASTVT: 41 J

## 2011-10-25 MED ORDER — VALSARTAN 40 MG PO TABS: 40.0000 mg | ORAL_TABLET | Freq: Every day | ORAL | Status: DC

## 2011-10-25 MED ORDER — CARVEDILOL 25 MG PO TABS: ORAL_TABLET | ORAL | Status: DC

## 2011-10-25 NOTE — Assessment & Plan Note (Signed)
As above.

## 2011-10-25 NOTE — Assessment & Plan Note (Deleted)
He is euvolemic today. He is also a little bit lightheaded and hypotensive. We will have him decrease his Lasix from 80 daily over the weekend to 40 daily. He is on appropriate guideline directed medical therapy; however, given hypotension today we will decrease his carvedilol from 25 twice daily to 12 and half/25. We'll also decrease his Diovan from 80-40

## 2011-10-25 NOTE — Assessment & Plan Note (Signed)
He is euvolemic today. He is also a little bit lightheaded and hypotensive. We will have him decrease his Lasix from 80 daily over the weekend to 40 daily. He is on appropriate guideline directed medical therapy; however, given hypotension today we will decrease his carvedilol from 25 twice daily to 12 and half/25. We'll also decrease his Diovan from 80-40  We will refer him for cardiopulmonary stress testing and I think it is time for [place] to reconsider replacing the left ventricular lead had dislodged.

## 2011-10-25 NOTE — Assessment & Plan Note (Signed)
He has had intercurrent ventricular tachycardia treated by his device

## 2011-10-25 NOTE — Assessment & Plan Note (Signed)
No intercurrent atrial fibrillation detected on his device

## 2011-10-25 NOTE — Progress Notes (Signed)
Patient Care Team: [name], MD as PCP - General (Family Medicine)   HPI  [name] is a 71 y.o. male Seen in followup for congestive heart failure in the setting of ischemic heart disease and previously implanted CRT-D with subsequent left ventricular lead macro dislodgment. His symptoms however been quite stable. When he saw Dr. Lovena Le concerning lead extraction and lead implantation he was doing as well as he had for some time and it was decided not to pursue it then.    He also has ventricular tachycardia for which he takes amiodarone. LFTs were abnormal and his Crestor was stopped. Repeat LFTs remained elevated when they were last checked in August.  Last cardiac catheterization in October 2008 showed an occluded right coronary artery with left-to-right collateralization. Ejection fraction is 20%. There was no other coronary disease noted. Last Myoview in June of 2010 showed prior inferior infarct but no ischemia. His ejection fraction was 21%previous carotid Dopplers in April 2006 showed 0-39% stenosis bilaterally. Previous abdominal ultrasound in April 2006 showed no aneurysm.  He has recently been hospitalized for bronchitis and congestive heart failure. Echo at that time showed severe akinesis and dyskinesis other brother with an EF of 15-20%. He had significant left atrial enlargement at 45 cm  Diovan was started. Previously that was associated with lightheadedness and he is again. He was a little bit diaphoretic this more  Past Medical History  Diagnosis Date  . Atrial fibrillation     Ablation for a-flutter 05/2000  . Cardiomyopathy     Ischemic.  EF 20%  . Hypertension   . Hyperlipidemia   . Allergy     rhinitis  . Obstructive sleep apnea     CPAP 16  . CAD (coronary artery disease)     PTCA RCA 1997 (Inf/post MI)  . Ventricular tachycardia     ICD, pacer  . Adenomatous colon polyp     Colonoscopy 07/2007---Dr. Watt Climes, [facility] (Rpt 5 yrs recommended)  . Kidney  stones     No distal stones on CT 2010  . Myocardial infarction   . Microscopic hematuria   . Diverticulosis of colon 2005    Noted on Colonoscopies in 2005 and 2009 and on CT 2010  . Internal hemorrhoids     Colonoscopy 07/2007  . Hepatic steatosis 2010    Noted on CT abd 2010 and on u/s abd 04/2009 (transaminasemia)  . Allergic rhinitis   . BPH (benign prostatic hypertrophy)     PSA 04/15/07 was 1.44  . History of impaired glucose tolerance     Past Surgical History  Procedure Date  . Icd     a) Guidant Contak H170- Virl Axe, MD 07/25/06 (second device) b) Medtronic CRT-D  . Breast surgery 1990    "Tissue removed from breast"  . Umbilical hernia repair   . Hernia repair 1975    bilateral inguinal  . Extracorporeal shock wave lithotripsy   . Atrial ablation surgery     Current Outpatient Prescriptions  Medication Sig Dispense Refill  . albuterol (VENTOLIN HFA) 108 (90 BASE) MCG/ACT inhaler Inhale 2 puffs into the lungs every 6 (six) hours as needed for wheezing.  1 Inhaler  2  . amiodarone (PACERONE) 200 MG tablet Take 1 tablet (200 mg total) by mouth daily.  90 tablet  3  . carvedilol (COREG) 25 MG tablet Take 1 tablet (25 mg total) by mouth 2 (two) times daily with a meal.  180 tablet  3  .  eplerenone (INSPRA) 25 MG tablet Take 1 tablet (25 mg total) by mouth daily.  90 tablet  3  . fluticasone (FLONASE) 50 MCG/ACT nasal spray Place 2 sprays into the nose 2 (two) times daily.  16 g  12  . furosemide (LASIX) 40 MG tablet Take 2 tablets (80 mg total) by mouth daily.  180 tablet  3  . levocetirizine (XYZAL) 5 MG tablet Take 1 tablet (5 mg total) by mouth every evening.  30 tablet  6  . mexiletine (MEXITIL) 150 MG capsule Take 300 mg by mouth 2 (two) times daily.      . MULTIPLE VITAMINS PO Take 1 capsule by mouth daily.        . Multiple Vitamins-Minerals (PRESERVISION AREDS 2 PO) Take 1 tablet by mouth daily.        . nitroGLYCERIN (NITROSTAT) 0.4 MG SL tablet Place 1 tablet  (0.4 mg total) under the tongue every 5 (five) minutes as needed.  25 tablet  2  . potassium chloride SA (K-DUR,KLOR-CON) 20 MEQ tablet Take 20 mEq by mouth daily.      . valsartan (DIOVAN) 80 MG tablet Take 1 tablet (80 mg total) by mouth daily.  90 tablet  3  . warfarin (COUMADIN) 5 MG tablet Take 2.5-5 mg by mouth daily. Tue and Sat = 2.5 5 mg all other days        Allergies  Allergen Reactions  . Sulfa Antibiotics Rash  . Amiodarone Other (See Comments)    Corneal deposits    Review of Systems negative except from HPI and PMH  Physical Exam BP 96/68  Pulse 60  Ht _0  (1.803 m)  Wt 230 lb (104.327 kg)  BMI 32.08 kg/m2 Well developed and well nourished in no acute distress HENT normal E scleral and icterus clear Neck Supple JVP  7-8 cm no HJR carotids brisk and full Clear to ausculation  Regular rate and rhythm, 2/6 systolic murmur heard at the apex Soft with active bowel sounds No clubbing cyanosis no  Edema Alert and oriented, grossly normal motor and sensory function Skin Warm and Dry    Assessment and  Plan

## 2011-10-25 NOTE — Patient Instructions (Addendum)
Your physician has recommended that you have a cardiopulmonary stress test (CPX). CPX testing is a non-invasive measurement of heart and lung function. It replaces a traditional treadmill stress test. This type of test provides a tremendous amount of information that relates not only to your present condition but also for future outcomes. This test combines measurements of you ventilation, respiratory gas exchange in the lungs, electrocardiogram (EKG), blood pressure and physical response before, during, and following an exercise protocol.  Change coreg dose to 1/2 tab in the a.m. And 1 whole tab in the evening.  Change Diovan to 40 mg daily.

## 2011-10-25 NOTE — Assessment & Plan Note (Signed)
The patient's device was interrogated and the information was fully reviewed.  The device was reprogrammed to  Improve heart rate response to exercise is chronotropic 1 he is noted with about 95%-98% of his heart these are less than 80 beats per minute

## 2011-11-05 ENCOUNTER — Ambulatory Visit (HOSPITAL_COMMUNITY): Payer: Medicare Other | Attending: Internal Medicine

## 2011-11-05 DIAGNOSIS — I2589 Other forms of chronic ischemic heart disease: Secondary | ICD-10-CM | POA: Insufficient documentation

## 2011-11-05 DIAGNOSIS — R5383 Other fatigue: Secondary | ICD-10-CM | POA: Insufficient documentation

## 2011-11-05 DIAGNOSIS — I509 Heart failure, unspecified: Secondary | ICD-10-CM | POA: Diagnosis not present

## 2011-11-05 DIAGNOSIS — R5381 Other malaise: Secondary | ICD-10-CM | POA: Diagnosis not present

## 2011-11-06 DIAGNOSIS — R0609 Other forms of dyspnea: Secondary | ICD-10-CM | POA: Diagnosis not present

## 2011-11-06 DIAGNOSIS — R0989 Other specified symptoms and signs involving the circulatory and respiratory systems: Secondary | ICD-10-CM

## 2011-11-06 DIAGNOSIS — I509 Heart failure, unspecified: Secondary | ICD-10-CM | POA: Diagnosis not present

## 2011-11-14 ENCOUNTER — Ambulatory Visit (INDEPENDENT_AMBULATORY_CARE_PROVIDER_SITE_OTHER): Payer: Medicare Other | Admitting: Cardiology

## 2011-11-14 ENCOUNTER — Ambulatory Visit (INDEPENDENT_AMBULATORY_CARE_PROVIDER_SITE_OTHER): Payer: Medicare Other | Admitting: *Deleted

## 2011-11-14 ENCOUNTER — Encounter: Payer: Self-pay | Admitting: Cardiology

## 2011-11-14 VITALS — BP 110/70 | HR 60 | Ht 71.0 in | Wt 231.8 lb

## 2011-11-14 DIAGNOSIS — I2510 Atherosclerotic heart disease of native coronary artery without angina pectoris: Secondary | ICD-10-CM

## 2011-11-14 DIAGNOSIS — I4891 Unspecified atrial fibrillation: Secondary | ICD-10-CM | POA: Diagnosis not present

## 2011-11-14 DIAGNOSIS — I4729 Other ventricular tachycardia: Secondary | ICD-10-CM | POA: Diagnosis not present

## 2011-11-14 DIAGNOSIS — I5022 Chronic systolic (congestive) heart failure: Secondary | ICD-10-CM

## 2011-11-14 DIAGNOSIS — Z95810 Presence of automatic (implantable) cardiac defibrillator: Secondary | ICD-10-CM | POA: Diagnosis not present

## 2011-11-14 DIAGNOSIS — I2589 Other forms of chronic ischemic heart disease: Secondary | ICD-10-CM

## 2011-11-14 DIAGNOSIS — Z7901 Long term (current) use of anticoagulants: Secondary | ICD-10-CM

## 2011-11-14 DIAGNOSIS — I472 Ventricular tachycardia: Secondary | ICD-10-CM | POA: Diagnosis not present

## 2011-11-14 DIAGNOSIS — I10 Essential (primary) hypertension: Secondary | ICD-10-CM

## 2011-11-14 NOTE — Progress Notes (Signed)
HPI: [name] is a pleasant gentleman with a history of coronary artery disease, mixed ischemic and nonischemic cardiomyopathy, history of ICD, paroxysmal atrial fibrillation, and ventricular tachycardia. Previous carotid Dopplers in April 2006 showed 0-39% stenosis bilaterally. Previous abdominal ultrasound in April 2006 showed no aneurysm. Last cardiac catheterization in October 2008 showed an occluded right coronary artery with left-to-right collateralization. Ejection fraction is 20%. There was no other coronary disease noted. Last Myoview in Dec 2011 showed prior inferior infarct but no ischemia. His ejection fraction was 19%. ABIs in December of 2011 were normal. An echocardiogram in Sept 2013 showed an ejection fraction of 15-20%, moderate to severe mitral regurgitation, moderate to severe left atrial enlargement, mild right ventricular and right atrial enlargement. The patient has had upgrade of his device to CRT-D. Note chest x-ray in September of 2013 showed moderate cardiomegaly and mild pulmonary vascular congestion. LFTs normal. TSH normal. I last saw him in Sept 2013. Since then, patient has dyspnea with more extreme activities but not routine activities. No orthopnea, PND, pedal edema, syncope or chest pain.   Current Outpatient Prescriptions  Medication Sig Dispense Refill  . albuterol (VENTOLIN HFA) 108 (90 BASE) MCG/ACT inhaler Inhale 2 puffs into the lungs every 6 (six) hours as needed for wheezing.  1 Inhaler  2  . amiodarone (PACERONE) 200 MG tablet Take 1 tablet (200 mg total) by mouth daily.  90 tablet  3  . carvedilol (COREG) 25 MG tablet Take 1/2 tab in the a.m. And one whole tab in the evening.  180 tablet  3  . eplerenone (INSPRA) 25 MG tablet Take 1 tablet (25 mg total) by mouth daily.  90 tablet  3  . fluticasone (FLONASE) 50 MCG/ACT nasal spray Place 2 sprays into the nose 2 (two) times daily.  16 g  12  . furosemide (LASIX) 40 MG tablet Take 2 tablets (80 mg total) by  mouth daily.  180 tablet  3  . levocetirizine (XYZAL) 5 MG tablet Take 1 tablet (5 mg total) by mouth every evening.  30 tablet  6  . mexiletine (MEXITIL) 150 MG capsule Take 300 mg by mouth 2 (two) times daily.      . MULTIPLE VITAMINS PO Take 1 capsule by mouth daily.        . Multiple Vitamins-Minerals (PRESERVISION AREDS 2 PO) Take 1 tablet by mouth daily.        . nitroGLYCERIN (NITROSTAT) 0.4 MG SL tablet Place 1 tablet (0.4 mg total) under the tongue every 5 (five) minutes as needed.  25 tablet  2  . potassium chloride SA (K-DUR,KLOR-CON) 20 MEQ tablet Take 20 mEq by mouth daily.      . valsartan (DIOVAN) 40 MG tablet Take 1 tablet (40 mg total) by mouth daily.  90 tablet  3  . warfarin (COUMADIN) 5 MG tablet Take 2.5-5 mg by mouth daily. Tue and Sat = 2.5 5 mg all other days         Past Medical History  Diagnosis Date  . Atrial fibrillation     Ablation for a-flutter 05/2000  . Cardiomyopathy     Ischemic.  EF 20%  . Hypertension   . Hyperlipidemia   . Allergy     rhinitis  . Obstructive sleep apnea     CPAP 16  . CAD (coronary artery disease)     PTCA RCA 1997 (Inf/post MI)  . Ventricular tachycardia     ICD, pacer  . Adenomatous colon polyp  Colonoscopy 07/2007---Dr. Watt Climes, [facility] (Rpt 5 yrs recommended)  . Kidney stones     No distal stones on CT 2010  . Myocardial infarction   . Microscopic hematuria   . Diverticulosis of colon 2005    Noted on Colonoscopies in 2005 and 2009 and on CT 2010  . Internal hemorrhoids     Colonoscopy 07/2007  . Hepatic steatosis 2010    Noted on CT abd 2010 and on u/s abd 04/2009 (transaminasemia)  . Allergic rhinitis   . BPH (benign prostatic hypertrophy)     PSA 04/15/07 was 1.44  . History of impaired glucose tolerance     Past Surgical History  Procedure Date  . Icd     a) Guidant Contak H170- Virl Axe, MD 07/25/06 (second device) b) Medtronic CRT-D  . Breast surgery 1990    "Tissue removed from breast"  . Umbilical  hernia repair   . Hernia repair 1975    bilateral inguinal  . Extracorporeal shock wave lithotripsy   . Atrial ablation surgery     History   Social History  . Marital Status: Married    Spouse Name: N/A    Number of Children: N/A  . Years of Education: N/A   Occupational History  . Retired    Social History Main Topics  . Smoking status: Former Smoker -- 2.0 packs/day for 15 years    Types: Cigarettes    Quit date: 01/15/1980  . Smokeless tobacco: Never Used  . Alcohol Use: No     former moderate drinker, none in 8 years  . Drug Use: No  . Sexually Active: Not on file   Other Topics Concern  . Not on file   Social History Narrative  . No narrative on file    ROS: no fevers or chills, productive cough, hemoptysis, dysphasia, odynophagia, melena, hematochezia, dysuria, hematuria, rash, seizure activity, orthopnea, PND, pedal edema, claudication. Remaining systems are negative.  Physical Exam: Well-developed well-nourished in no acute distress.  Skin is warm and dry.  HEENT is normal.  Neck is supple.  Chest is clear to auscultation with normal expansion.  Cardiovascular exam is regular rate and rhythm. 3/6 systolic murmur apex. Abdominal exam nontender or distended. No masses palpated. Extremities show no edema. neuro grossly intact

## 2011-11-14 NOTE — Assessment & Plan Note (Signed)
Plan continue present medications. Beta blocker and ARB recently decreased by Dr. Caryl Comes because of dizziness.

## 2011-11-14 NOTE — Assessment & Plan Note (Signed)
Management per electrophysiology. 

## 2011-11-14 NOTE — Assessment & Plan Note (Signed)
Continue amiodarone and mexiletine.

## 2011-11-14 NOTE — Assessment & Plan Note (Signed)
Dr. Caryl Comes arranged a recent CPX. There is mention of possible revision of left ventricular lead. I will review this with him and make further recommendations.

## 2011-11-14 NOTE — Assessment & Plan Note (Signed)
Plan continue present medications including Coumadin.

## 2011-11-14 NOTE — Patient Instructions (Addendum)
Your physician recommends that you schedule a follow-up appointment in: 3 MONTHS WITH DR CRENSHAW  

## 2011-11-14 NOTE — Assessment & Plan Note (Signed)
Continue present blood pressure medications.

## 2011-11-14 NOTE — Assessment & Plan Note (Signed)
Not on aspirin given need for Coumadin. Not on statin as he has had increased liver functions with this previously.

## 2011-11-15 ENCOUNTER — Other Ambulatory Visit: Payer: Self-pay | Admitting: *Deleted

## 2011-11-15 MED ORDER — LEVOCETIRIZINE DIHYDROCHLORIDE 5 MG PO TABS: 5.0000 mg | ORAL_TABLET | Freq: Every evening | ORAL | Status: DC

## 2011-11-18 MED ORDER — ALBUTEROL SULFATE HFA 108 (90 BASE) MCG/ACT IN AERS: 2.0000 | INHALATION_SPRAY | Freq: Four times a day (QID) | RESPIRATORY_TRACT | Status: DC | PRN

## 2011-11-18 NOTE — Telephone Encounter (Signed)
Faxed refill request received from pharmacy for LEVOCETIRIZINE Last filled by MD on 04/19/11, #30 X 5 RX SENT  Faxed refill request received from pharmacy for VENTOLIN Last filled by MD on 05/24/11, #1 X 2 Refill sent per [facility] refill protocol.  Last seen on 10/03/11 Follow up 12/04/11

## 2011-11-27 ENCOUNTER — Encounter: Payer: Self-pay | Admitting: Pulmonary Disease

## 2011-11-27 ENCOUNTER — Ambulatory Visit (INDEPENDENT_AMBULATORY_CARE_PROVIDER_SITE_OTHER): Payer: Medicare Other | Admitting: Pulmonary Disease

## 2011-11-27 VITALS — BP 102/80 | HR 72 | Temp 98.0°F | Ht 71.0 in | Wt 234.0 lb

## 2011-11-27 DIAGNOSIS — R0989 Other specified symptoms and signs involving the circulatory and respiratory systems: Secondary | ICD-10-CM | POA: Diagnosis not present

## 2011-11-27 MED ORDER — PANTOPRAZOLE SODIUM 40 MG PO TBEC: 40.0000 mg | DELAYED_RELEASE_TABLET | Freq: Two times a day (BID) | ORAL | Status: DC

## 2011-11-27 MED ORDER — PANTOPRAZOLE SODIUM 40 MG PO TBEC: 40.0000 mg | DELAYED_RELEASE_TABLET | Freq: Every day | ORAL | Status: DC

## 2011-11-27 NOTE — Patient Instructions (Addendum)
Will start on protonix 82m one in am and pm Stop albuterol inhaler.  Your testing shows no evidence for obstructive lung disease. Stay on your antihistamine and nasal spray.  Do not take allegra if you are taking xyzal. Please call me in 3-4 weeks with your response to the acid reflux medicine ( does your cough, "congestion", and mucus get better)

## 2011-11-27 NOTE — Progress Notes (Signed)
  Subjective:    Patient ID: [name], male    DOB: 07-02-1940, 71 y.o.   MRN: [id number]  HPI Patient comes in today for an acute sick visit.  I usually follow him for obstructive sleep apnea, and he has had bronchitis symptoms in the past with no airflow obstruction on spirometry.  He comes in today where he was recently in the hospital for a CHF exacerbation, and he also had some hemoptysis that was probably secondary to his volume overload and his anticoagulation.  He was treated with antibiotics empirically.  The patient notes that he has continued to have some "congestion", cough, and very scant nonpurulent mucus.  He has a long history of chronic allergic rhinitis, but does not think postnasal drip is the issue.  He also has a history of reflux disease, but denies having symptoms currently.  He has had a recent cardiopulmonary  Exercise test where the spirometry pre-testing showed no airflow obstruction, but mild restriction.  The patient has been using albuterol as needed, and thinks that it may help his breathing.   Review of Systems  Constitutional: Negative for fever and unexpected weight change.  HENT: Positive for congestion and sneezing. Negative for ear pain, nosebleeds, sore throat, rhinorrhea, trouble swallowing, dental problem, postnasal drip and sinus pressure.   Eyes: Negative for redness and itching.  Respiratory: Positive for cough and wheezing. Negative for chest tightness and shortness of breath.   Cardiovascular: Negative for palpitations and leg swelling.  Gastrointestinal: Negative for nausea and vomiting.  Genitourinary: Negative for dysuria.  Musculoskeletal: Negative for joint swelling.  Skin: Negative for rash.  Neurological: Negative for headaches.  Hematological: Does not bruise/bleed easily.  Psychiatric/Behavioral: Negative for dysphoric mood. The patient is not nervous/anxious.        Objective:   Physical Exam Overweight male in no acute distress Nose  without purulence or discharge noted Oropharynx clear Neck supple without lymphadenopathy or thyromegaly Chest totally clear to auscultation, no wheezing Cardiac exam with regular rate and rhythm, 2/6 murmur Lower extremities with no significant edema, no cyanosis Alert and oriented, moves all 4 extremities.       Assessment & Plan:

## 2011-11-27 NOTE — Assessment & Plan Note (Signed)
The patient has had persistent chest congestion, but no significant mucous production.  He has had no airflow obstruction by spirometry in 2012, or with his recent cardiopulmonary exercise test.  I suspect that his symptoms are coming more from an upper airway process than lower.  I am suspicious that acid reflux may be causing a lot of his pulmonary symptoms, and would like to give him a trial of b.i.d. Proton pump inhibitor for the next 4 weeks to see if things improve.  The patient is agreeable to this approach.

## 2011-11-28 ENCOUNTER — Ambulatory Visit (INDEPENDENT_AMBULATORY_CARE_PROVIDER_SITE_OTHER): Payer: Medicare Other

## 2011-11-28 DIAGNOSIS — Z7901 Long term (current) use of anticoagulants: Secondary | ICD-10-CM

## 2011-11-28 DIAGNOSIS — I4891 Unspecified atrial fibrillation: Secondary | ICD-10-CM

## 2011-11-28 LAB — POCT INR: INR: 3

## 2011-11-29 ENCOUNTER — Other Ambulatory Visit: Payer: Self-pay | Admitting: *Deleted

## 2011-11-29 MED ORDER — POTASSIUM CHLORIDE CRYS ER 20 MEQ PO TBCR: 20.0000 meq | EXTENDED_RELEASE_TABLET | Freq: Every day | ORAL | Status: DC

## 2011-12-02 ENCOUNTER — Encounter: Payer: Self-pay | Admitting: Internal Medicine

## 2011-12-02 ENCOUNTER — Encounter: Payer: Self-pay | Admitting: *Deleted

## 2011-12-02 ENCOUNTER — Ambulatory Visit (INDEPENDENT_AMBULATORY_CARE_PROVIDER_SITE_OTHER): Payer: Medicare Other | Admitting: Internal Medicine

## 2011-12-02 VITALS — BP 104/68 | HR 61 | Ht 71.0 in | Wt 228.6 lb

## 2011-12-02 DIAGNOSIS — I5022 Chronic systolic (congestive) heart failure: Secondary | ICD-10-CM | POA: Diagnosis not present

## 2011-12-02 DIAGNOSIS — I4729 Other ventricular tachycardia: Secondary | ICD-10-CM

## 2011-12-02 DIAGNOSIS — I472 Ventricular tachycardia: Secondary | ICD-10-CM

## 2011-12-02 DIAGNOSIS — Z95810 Presence of automatic (implantable) cardiac defibrillator: Secondary | ICD-10-CM

## 2011-12-02 LAB — ICD DEVICE OBSERVATION
AL IMPEDENCE ICD: 536 Ohm
AL THRESHOLD: 0.5 V
CHARGE TIME: 8.7 s
HV IMPEDENCE: 52 Ohm
VENTRICULAR PACING ICD: 100 pct

## 2011-12-02 NOTE — Assessment & Plan Note (Signed)
His congestive heart failure symptoms have worsened. I discussed the treatment options with the patient and his son who is with him today. The risk, goals, benefits, and expectations of left ventricular lead revision have been discussed. My hope is that a new lead couldn't be placed on the ipsilateral side. He may require placement of his lead from the contralateral side with tunneling. He wishes to proceed with the procedure and this will be scheduled in the next several weeks.

## 2011-12-02 NOTE — Patient Instructions (Signed)
See instruction sheet for LV lead revision

## 2011-12-02 NOTE — Assessment & Plan Note (Signed)
His device is working normally and has excellent estimated longevity. I plan to use his current device with LV lead revision.

## 2011-12-02 NOTE — Progress Notes (Signed)
HPI [name] is referred today by Dr. Klein to consider left ventricular lead revision. The patient is a 71-year-old man with chronic systolic heart failure who I initially saw several years ago. At that time he had ventricular lead dislodgment and was referred back to consider insertion of a new left ventricular lead. His heart failure symptoms were at worst class II and his ejection fraction was 35% and we decided that his symptoms did not seem bad enough based on the risk of the procedure. In the interim, he is worsened. His ejection fraction is now 10-15%, and his heart failure symptoms are class IIIB. His left ventricular lead has retracted back, may at this point actually be out of the vascular space. The patient has had multiple procedures previously. Allergies  Allergen Reactions  . Sulfa Antibiotics Rash  . Amiodarone Other (See Comments)    Corneal deposits     Current Outpatient Prescriptions  Medication Sig Dispense Refill  . amiodarone (PACERONE) 200 MG tablet Take 1 tablet (200 mg total) by mouth daily.  90 tablet  3  . carvedilol (COREG) 25 MG tablet Take 1/2 tab in the a.m. And one whole tab in the evening.  180 tablet  3  . eplerenone (INSPRA) 25 MG tablet Take 1 tablet (25 mg total) by mouth daily.  90 tablet  3  . fluticasone (FLONASE) 50 MCG/ACT nasal spray Place 2 sprays into the nose 2 (two) times daily.  16 g  12  . furosemide (LASIX) 40 MG tablet Take 2 tablets (80 mg total) by mouth daily.  180 tablet  3  . guaiFENesin (MUCINEX) 600 MG 12 hr tablet Take 1,200 mg by mouth 2 (two) times daily. As needed      . levocetirizine (XYZAL) 5 MG tablet Take 1 tablet (5 mg total) by mouth every evening.  30 tablet  6  . mexiletine (MEXITIL) 150 MG capsule Take 300 mg by mouth 2 (two) times daily.      . MULTIPLE VITAMINS PO Take 1 capsule by mouth daily.        . Multiple Vitamins-Minerals (PRESERVISION AREDS 2 PO) Take 1 tablet by mouth daily.        . nitroGLYCERIN (NITROSTAT)  0.4 MG SL tablet Place 1 tablet (0.4 mg total) under the tongue every 5 (five) minutes as needed.  25 tablet  2  . pantoprazole (PROTONIX) 40 MG tablet Take 1 tablet (40 mg total) by mouth 2 (two) times daily.  60 tablet  0  . potassium chloride SA (K-DUR,KLOR-CON) 20 MEQ tablet Take 1 tablet (20 mEq total) by mouth daily.  30 tablet  2  . valsartan (DIOVAN) 40 MG tablet Take 1 tablet (40 mg total) by mouth daily.  90 tablet  3  . warfarin (COUMADIN) 5 MG tablet Take 2.5-5 mg by mouth daily. Tue and Sat = 2.5 5 mg all other days         Past Medical History  Diagnosis Date  . Atrial fibrillation     Ablation for a-flutter 05/2000  . Cardiomyopathy     Ischemic.  EF 20%  . Hypertension   . Hyperlipidemia   . Allergy     rhinitis  . Obstructive sleep apnea     CPAP 16  . CAD (coronary artery disease)     PTCA RCA 1997 (Inf/post MI)  . Ventricular tachycardia     ICD, pacer  . Adenomatous colon polyp     Colonoscopy 07/2007---Dr. Magod, [facility] (Rpt 5 yrs recommended)  . Kidney stones     No distal stones on CT 2010  . Myocardial infarction   . Microscopic hematuria   . Diverticulosis of colon 2005    Noted on Colonoscopies in 2005 and 2009 and on CT 2010  . Internal hemorrhoids     Colonoscopy 07/2007  . Hepatic steatosis 2010    Noted on CT abd 2010 and on u/s abd 04/2009 (transaminasemia)  . Allergic rhinitis   . BPH (benign prostatic hypertrophy)     PSA 04/15/07 was 1.44  . History of impaired glucose tolerance     ROS:   All systems reviewed and negative except as noted in the HPI.   Past Surgical History  Procedure Date  . Icd     a) Guidant Contak H170- Steven Klein, MD 07/25/06 (second device) b) Medtronic CRT-D  . Breast surgery 1990    "Tissue removed from breast"  . Umbilical hernia repair   . Hernia repair 1975    bilateral inguinal  . Extracorporeal shock wave lithotripsy   . Atrial ablation surgery      Family History  Problem Relation Age of Onset   . Heart disease Mother   . Cancer Maternal Grandmother     breast  . Coronary artery disease Other     family hx of  . Hyperlipidemia Other     family hx of  . Heart attack Father     4 stents/ pacemaker  . Cancer Father     prostate  . Hypertension Sister   . Goiter Paternal Grandmother   . Nephrolithiasis Son      History   Social History  . Marital Status: Married    Spouse Name: N/A    Number of Children: N/A  . Years of Education: N/A   Occupational History  . Retired    Social History Main Topics  . Smoking status: Former Smoker -- 2.0 packs/day for 15 years    Types: Cigarettes    Quit date: 01/15/1980  . Smokeless tobacco: Never Used  . Alcohol Use: No     Comment: former moderate drinker, none in 8 years  . Drug Use: No  . Sexually Active: Not on file   Other Topics Concern  . Not on file   Social History Narrative  . No narrative on file     BP 104/68  Pulse 61  Ht 5' 11" (1.803 m)  Wt 228 lb 9.6 oz (103.692 kg)  BMI 31.88 kg/m2  SpO2 98%  Physical Exam:  Stable appearing 71-year-old man, NAD HEENT: Unremarkable Neck: 8 cm JVD, no thyromegally Lungs:  Clear with no wheezes or rhonchi. A similar rales are present. HEART:  Regular rate rhythm, no murmurs, no rubs, no clicks Abd:  soft, positive bowel sounds, no organomegally, no rebound, no guarding Ext:  2 plus pulses, no edema, no cyanosis, no clubbing Skin:  No rashes no nodules Neuro:  CN II through XII intact, motor grossly intact   DEVICE  Normal device function except for left ventricular lead dysfunction.  See PaceArt for details.   Assess/Plan:  

## 2011-12-04 ENCOUNTER — Encounter: Payer: Self-pay | Admitting: Family Medicine

## 2011-12-04 ENCOUNTER — Ambulatory Visit (INDEPENDENT_AMBULATORY_CARE_PROVIDER_SITE_OTHER): Payer: Medicare Other | Admitting: Family Medicine

## 2011-12-04 VITALS — BP 107/75 | HR 62 | Ht 71.0 in | Wt 227.0 lb

## 2011-12-04 DIAGNOSIS — I2589 Other forms of chronic ischemic heart disease: Secondary | ICD-10-CM

## 2011-12-04 DIAGNOSIS — G3184 Mild cognitive impairment, so stated: Secondary | ICD-10-CM

## 2011-12-04 NOTE — Assessment & Plan Note (Signed)
Stable since recent hospitalization and diuresis. He had his electrolytes/cr checked after getting out of hospital (10/18/11) and these were normal. Continue current meds at current doses, no labs today. He'll get replacement of his cardiac resynchronization device by Dr.'s Lovena Le and Caryl Comes soon.  Hopefully this will allow for better EF and improvement in his MVR.

## 2011-12-04 NOTE — Progress Notes (Signed)
OFFICE NOTE  12/04/2011  CC:  Chief Complaint  Patient presents with  . Follow-up    cognitive impairment     HPI: Patient is a 71 y.o. Caucasian male who is here for f/u mild cognitive impairment (from 05/2011) and recent hospitalization for acute exac of CHF and acute bronchitis (09/2011).  Has appt with cardiologist coming up to replace the broken lead on his cardiac synchronization device---his EF at recent hospitalization was down to 15% range and he had significant akinesis of LV with moderate to severe mitral regurg.   His hacky cough is greatly diminished lately, particularly ever since starting pantoprazole that was rx'd by pulmonologist.   He says he doesn't feel fluid overloaded (he usually feels this in his abdomen, no LE edema). He has been working on diet in order to lose some weight. He says his mild memory impairment and word-finding difficulties have been stable.  Not causing any dysfunction or embarrassment.   Pertinent PMH:  Past Medical History  Diagnosis Date  . Atrial fibrillation     Ablation for a-flutter 05/2000  . Cardiomyopathy     Ischemic.  EF 20%  . Hypertension   . Hyperlipidemia   . Allergy     rhinitis  . Obstructive sleep apnea     CPAP 16  . CAD (coronary artery disease)     PTCA RCA 1997 (Inf/post MI)  . Ventricular tachycardia     ICD, pacer  . Adenomatous colon polyp     Colonoscopy 07/2007---Dr. Watt Climes, [facility] (Rpt 5 yrs recommended)  . Kidney stones     No distal stones on CT 2010  . Myocardial infarction   . Microscopic hematuria   . Diverticulosis of colon 2005    Noted on Colonoscopies in 2005 and 2009 and on CT 2010  . Internal hemorrhoids     Colonoscopy 07/2007  . Hepatic steatosis 2010    Noted on CT abd 2010 and on u/s abd 04/2009 (transaminasemia)  . Allergic rhinitis   . BPH (benign prostatic hypertrophy)     PSA 04/15/07 was 1.44  . History of impaired glucose tolerance     MEDS:  Outpatient Prescriptions Prior to  Visit  Medication Sig Dispense Refill  . amiodarone (PACERONE) 200 MG tablet Take 1 tablet (200 mg total) by mouth daily.  90 tablet  3  . carvedilol (COREG) 25 MG tablet Take 1/2 tab in the a.m. And one whole tab in the evening.  180 tablet  3  . eplerenone (INSPRA) 25 MG tablet Take 1 tablet (25 mg total) by mouth daily.  90 tablet  3  . fluticasone (FLONASE) 50 MCG/ACT nasal spray Place 2 sprays into the nose 2 (two) times daily.  16 g  12  . furosemide (LASIX) 40 MG tablet Take 2 tablets (80 mg total) by mouth daily.  180 tablet  3  . guaiFENesin (MUCINEX) 600 MG 12 hr tablet Take 1,200 mg by mouth 2 (two) times daily. As needed      . levocetirizine (XYZAL) 5 MG tablet Take 1 tablet (5 mg total) by mouth every evening.  30 tablet  6  . mexiletine (MEXITIL) 150 MG capsule Take 300 mg by mouth 2 (two) times daily.      . MULTIPLE VITAMINS PO Take 1 capsule by mouth daily.        . Multiple Vitamins-Minerals (PRESERVISION AREDS 2 PO) Take 1 tablet by mouth daily.        . nitroGLYCERIN (  NITROSTAT) 0.4 MG SL tablet Place 1 tablet (0.4 mg total) under the tongue every 5 (five) minutes as needed.  25 tablet  2  . pantoprazole (PROTONIX) 40 MG tablet Take 1 tablet (40 mg total) by mouth 2 (two) times daily.  60 tablet  0  . potassium chloride SA (K-DUR,KLOR-CON) 20 MEQ tablet Take 1 tablet (20 mEq total) by mouth daily.  30 tablet  2  . valsartan (DIOVAN) 40 MG tablet Take 1 tablet (40 mg total) by mouth daily.  90 tablet  3  . warfarin (COUMADIN) 5 MG tablet Take 2.5-5 mg by mouth daily. Tue and Sat = 2.5 5 mg all other days       Last reviewed on 12/04/2011 11:41 AM by [name], MD  PE: Blood pressure 107/75, pulse 62, height _0  (1.803 m), weight 227 lb (102.967 kg). Gen: Alert, well appearing.  Patient is oriented to person, place, time, and situation. AFFECT: pleasant, lucid thought and speech. CV: Regular, distant S1 and S2. Lungs: CTA bilat, nonlabored resps. EXT: no  c/c/e  IMPRESSION AND PLAN:  Mild cognitive impairment Stable.  No meds at this time. Will resume aricept at 59m dosing IF he has worsening in the future and avoid 161mdosing b/c it caused weird nightmares.  CARDIOMYOPATHY, ISCHEMIC Stable since recent hospitalization and diuresis. He had his electrolytes/cr checked after getting out of hospital (10/18/11) and these were normal. Continue current meds at current doses, no labs today. He'll get replacement of his cardiac resynchronization device by Dr.'s TaLovena Lend KlCaryl Comesoon.  Hopefully this will allow for better EF and improvement in his MVR.   An After Visit Summary was printed and given to the patient.  FOLLOW UP: 3 mo

## 2011-12-04 NOTE — Assessment & Plan Note (Signed)
Stable.  No meds at this time. Will resume aricept at 24m dosing IF he has worsening in the future and avoid 160mdosing b/c it caused weird nightmares.

## 2011-12-06 ENCOUNTER — Other Ambulatory Visit: Payer: Self-pay | Admitting: *Deleted

## 2011-12-06 DIAGNOSIS — Z95810 Presence of automatic (implantable) cardiac defibrillator: Secondary | ICD-10-CM

## 2011-12-06 DIAGNOSIS — T82198A Other mechanical complication of other cardiac electronic device, initial encounter: Secondary | ICD-10-CM

## 2011-12-11 ENCOUNTER — Ambulatory Visit (INDEPENDENT_AMBULATORY_CARE_PROVIDER_SITE_OTHER): Payer: Medicare Other | Admitting: *Deleted

## 2011-12-11 DIAGNOSIS — I4891 Unspecified atrial fibrillation: Secondary | ICD-10-CM

## 2011-12-11 DIAGNOSIS — Z7901 Long term (current) use of anticoagulants: Secondary | ICD-10-CM | POA: Diagnosis not present

## 2011-12-17 ENCOUNTER — Telehealth: Payer: Self-pay | Admitting: Cardiology

## 2011-12-17 NOTE — Telephone Encounter (Signed)
Left pt a message to call back. 

## 2011-12-17 NOTE — Telephone Encounter (Signed)
Dr Lovena Le doing procedure on 12-12, dr Caryl Comes said he wanted dr Stanford Breed to do a heart cath, pt wants to know when/if it will be done, and also needs his pharmacy [facility] [place]  [phone]

## 2011-12-18 ENCOUNTER — Encounter: Payer: Self-pay | Admitting: *Deleted

## 2011-12-18 ENCOUNTER — Telehealth: Payer: Self-pay | Admitting: Cardiology

## 2011-12-18 NOTE — Telephone Encounter (Signed)
Pt rtn call [phone]

## 2011-12-18 NOTE — Telephone Encounter (Signed)
Spoke with pt, see previous phone note.

## 2011-12-18 NOTE — Telephone Encounter (Signed)
Left message for pt to call

## 2011-12-18 NOTE — Telephone Encounter (Signed)
Pt scheduled for left heart cath Monday 12-23-11 with dr cooper. He is coming in for lab work tomorrow. He forgot to take his coumadin yesterday and has not taken it yet today. The pt will need to hold the coumadin 5 days prior to the cath. Instructions discussed with pt and letter placed at the front desk for him tomorrow when he comes for labs.

## 2011-12-18 NOTE — Telephone Encounter (Signed)
Spoke with pt, he is ready for his cath. Will discuss with dr Stanford Breed, schedule procedure and call pt back.

## 2011-12-19 ENCOUNTER — Encounter (HOSPITAL_COMMUNITY): Payer: Self-pay | Admitting: Pharmacy Technician

## 2011-12-19 ENCOUNTER — Other Ambulatory Visit (INDEPENDENT_AMBULATORY_CARE_PROVIDER_SITE_OTHER): Payer: Medicare Other

## 2011-12-19 ENCOUNTER — Ambulatory Visit (INDEPENDENT_AMBULATORY_CARE_PROVIDER_SITE_OTHER): Payer: Medicare Other

## 2011-12-19 DIAGNOSIS — I4891 Unspecified atrial fibrillation: Secondary | ICD-10-CM | POA: Diagnosis not present

## 2011-12-19 DIAGNOSIS — I4729 Other ventricular tachycardia: Secondary | ICD-10-CM | POA: Diagnosis not present

## 2011-12-19 DIAGNOSIS — I472 Ventricular tachycardia: Secondary | ICD-10-CM

## 2011-12-19 DIAGNOSIS — Z7901 Long term (current) use of anticoagulants: Secondary | ICD-10-CM | POA: Diagnosis not present

## 2011-12-19 LAB — CBC WITH DIFFERENTIAL/PLATELET
Basophils Relative: 0.6 % (ref 0.0–3.0)
Eosinophils Absolute: 0.3 10*3/uL (ref 0.0–0.7)
Eosinophils Relative: 5.6 % — ABNORMAL HIGH (ref 0.0–5.0)
HCT: 44.8 % (ref 39.0–52.0)
Lymphs Abs: 1.9 10*3/uL (ref 0.7–4.0)
MCHC: 33.5 g/dL (ref 30.0–36.0)
MCV: 101.8 fl — ABNORMAL HIGH (ref 78.0–100.0)
Monocytes Absolute: 0.5 10*3/uL (ref 0.1–1.0)
RBC: 4.4 Mil/uL (ref 4.22–5.81)
WBC: 6.2 10*3/uL (ref 4.5–10.5)

## 2011-12-19 LAB — BASIC METABOLIC PANEL
BUN: 18 mg/dL (ref 6–23)
Calcium: 9.2 mg/dL (ref 8.4–10.5)
Creatinine, Ser: 1 mg/dL (ref 0.4–1.5)
GFR: 74.99 mL/min (ref >60.00)
Glucose, Bld: 125 mg/dL — ABNORMAL HIGH (ref 70–99)

## 2011-12-20 ENCOUNTER — Telehealth: Payer: Self-pay | Admitting: Cardiology

## 2011-12-20 MED ORDER — MEXILETINE HCL 150 MG PO CAPS: 300.0000 mg | ORAL_CAPSULE | Freq: Two times a day (BID) | ORAL | Status: DC

## 2011-12-20 NOTE — Telephone Encounter (Signed)
New Problem:    Patient called in needing a refill of his mexiletine (MEXITIL) 150 MG capsule at [facility] [place] phone [phone].  This is the patients new permanent pharmacy.

## 2011-12-23 ENCOUNTER — Ambulatory Visit (INDEPENDENT_AMBULATORY_CARE_PROVIDER_SITE_OTHER): Payer: Medicare Other | Admitting: *Deleted

## 2011-12-23 ENCOUNTER — Encounter (HOSPITAL_BASED_OUTPATIENT_CLINIC_OR_DEPARTMENT_OTHER)
Admission: RE | Disposition: A | Discharge status: Home / Self Care | Payer: Self-pay | Source: Ambulatory Visit | Attending: Cardiovascular Disease

## 2011-12-23 ENCOUNTER — Encounter (HOSPITAL_BASED_OUTPATIENT_CLINIC_OR_DEPARTMENT_OTHER): Payer: Self-pay

## 2011-12-23 ENCOUNTER — Inpatient Hospital Stay (HOSPITAL_BASED_OUTPATIENT_CLINIC_OR_DEPARTMENT_OTHER)
Admission: RE | Admit: 2011-12-23 | Discharge: 2011-12-23 | Disposition: A | Discharge status: Home / Self Care | Payer: Medicare Other | Source: Ambulatory Visit | Attending: Cardiovascular Disease | Admitting: Cardiovascular Disease

## 2011-12-23 DIAGNOSIS — I2589 Other forms of chronic ischemic heart disease: Secondary | ICD-10-CM | POA: Diagnosis not present

## 2011-12-23 DIAGNOSIS — I2582 Chronic total occlusion of coronary artery: Secondary | ICD-10-CM | POA: Insufficient documentation

## 2011-12-23 DIAGNOSIS — Z7901 Long term (current) use of anticoagulants: Secondary | ICD-10-CM

## 2011-12-23 DIAGNOSIS — I4891 Unspecified atrial fibrillation: Secondary | ICD-10-CM | POA: Diagnosis not present

## 2011-12-23 DIAGNOSIS — I5022 Chronic systolic (congestive) heart failure: Secondary | ICD-10-CM | POA: Diagnosis not present

## 2011-12-23 DIAGNOSIS — I252 Old myocardial infarction: Secondary | ICD-10-CM | POA: Insufficient documentation

## 2011-12-23 DIAGNOSIS — I2510 Atherosclerotic heart disease of native coronary artery without angina pectoris: Secondary | ICD-10-CM | POA: Insufficient documentation

## 2011-12-23 DIAGNOSIS — E785 Hyperlipidemia, unspecified: Secondary | ICD-10-CM | POA: Insufficient documentation

## 2011-12-23 DIAGNOSIS — I10 Essential (primary) hypertension: Secondary | ICD-10-CM | POA: Insufficient documentation

## 2011-12-23 DIAGNOSIS — I509 Heart failure, unspecified: Secondary | ICD-10-CM | POA: Insufficient documentation

## 2011-12-23 LAB — POCT INR: INR: 1.6

## 2011-12-23 SURGERY — JV LEFT HEART CATHETERIZATION WITH CORONARY ANGIOGRAM
Anesthesia: Moderate Sedation

## 2011-12-23 MED ORDER — ACETAMINOPHEN 325 MG PO TABS: 650.0000 mg | ORAL_TABLET | ORAL | Status: DC | PRN

## 2011-12-23 MED ORDER — SODIUM CHLORIDE 0.9 % IV SOLN: 1.0000 mL/kg/h | INTRAVENOUS | Status: DC

## 2011-12-23 MED ORDER — SODIUM CHLORIDE 0.9 % IV SOLN
INTRAVENOUS | Status: DC
  Administered 2011-12-23: 11:00:00 via INTRAVENOUS

## 2011-12-23 MED ORDER — ONDANSETRON HCL 4 MG/2ML IJ SOLN: 4.0000 mg | Freq: Four times a day (QID) | INTRAMUSCULAR | Status: DC | PRN

## 2011-12-23 NOTE — Progress Notes (Signed)
TR band removed from right wrist.  Site level 0, gauze and tegaderm dressing applied and a wrist immobilizer.

## 2011-12-23 NOTE — Progress Notes (Signed)
Positive Allen's test performed to right hand, spo2 95%.

## 2011-12-23 NOTE — Interval H&P Note (Signed)
History and Physical Interval Note:  12/23/2011 7:32 PM  [name]  has presented today for surgery, with the diagnosis of cp  The various methods of treatment have been discussed with the patient and family. After consideration of risks, benefits and other options for treatment, the patient has consented to  Procedure(s) (LRB) with comments: JV LEFT HEART CATHETERIZATION WITH CORONARY ANGIOGRAM (N/A) as a surgical intervention .  The patient's history has been reviewed, patient examined, no change in status, stable for surgery.  I have reviewed the patient's chart and labs.  Questions were answered to the patient's satisfaction.    Cardiac cath requested prior to LV lead revision. Pt with known severe ischemic CM.   Sherren Mocha

## 2011-12-23 NOTE — CV Procedure (Signed)
   Cardiac Catheterization Procedure Note  Name: [name] MRN: [id number] DOB: 08-03-1940  Procedure: Left Heart Cath, Selective Coronary Angiography, LV angiography  Indication: Severe cardiomyopathy. Known CAD/ischemic cardiomyopathy. For LV lead revision.   Procedural Details: The right wrist was prepped, draped, and anesthetized with 1% lidocaine. Using the modified Seldinger technique, a 5 French sheath was introduced into the right radial artery. 3 mg of verapamil was administered through the sheath, weight-based unfractionated heparin was administered intravenously. Standard Judkins catheters were used for selective coronary angiography and left ventriculography. Catheter exchanges were performed over an exchange length guidewire. There were no immediate procedural complications. A TR band was used for radial hemostasis at the completion of the procedure.  The patient was transferred to the post catheterization recovery area for further monitoring.  Procedural Findings: Hemodynamics: AO 95/15 LV 97/55 mean 71  Coronary angiography: Coronary dominance: right  Left mainstem: Widely patent with no obstructive disease.  Left anterior descending (LAD): Widely patent throughout its course. Minor regularity of the proximal LAD is noted. First diagonal branch is patent without significant stenosis.  Left circumflex (LCx): The circumflex is patent with no obstructive disease. There is a moderate caliber intermediate branch and no significant disease. The obtuse marginal branch is patent.  Right coronary artery (RCA): Chronically occluded in the mid aspect. There is a well formed left to right collateral supplying both the PDA and PLA branches of the RCA.  Left ventriculography: There is very severe left ventricular systolic dysfunction with akinesis of the entire inferior wall and apex and severe hypokinesis of the anterior wall. The estimated left ventricular ejection fraction is less  than 20%.   Final Conclusions:   1. Single-vessel coronary artery disease with chronic total occlusion of the RCA and left to right collaterals 2. Minimal nonobstructive disease of the LAD and left circumflex vessels 3. Severe cardiomyopathy  Recommendations: Medical therapy and continue plans as per Dr. Caryl Comes. The patient's coronary anatomy is stable.  Sherren Mocha 12/23/2011, 12:34 PM

## 2011-12-23 NOTE — H&P (View-Only) (Signed)
HPI [name] is referred today by Dr. Caryl Comes to consider left ventricular lead revision. The patient is a 71 year old man with chronic systolic heart failure who I initially saw several years ago. At that time he had ventricular lead dislodgment and was referred back to consider insertion of a new left ventricular lead. His heart failure symptoms were at worst class II and his ejection fraction was 35% and we decided that his symptoms did not seem bad enough based on the risk of the procedure. In the interim, he is worsened. His ejection fraction is now 10-15%, and his heart failure symptoms are class IIIB. His left ventricular lead has retracted back, may at this point actually be out of the vascular space. The patient has had multiple procedures previously. Allergies  Allergen Reactions  . Sulfa Antibiotics Rash  . Amiodarone Other (See Comments)    Corneal deposits     Current Outpatient Prescriptions  Medication Sig Dispense Refill  . amiodarone (PACERONE) 200 MG tablet Take 1 tablet (200 mg total) by mouth daily.  90 tablet  3  . carvedilol (COREG) 25 MG tablet Take 1/2 tab in the a.m. And one whole tab in the evening.  180 tablet  3  . eplerenone (INSPRA) 25 MG tablet Take 1 tablet (25 mg total) by mouth daily.  90 tablet  3  . fluticasone (FLONASE) 50 MCG/ACT nasal spray Place 2 sprays into the nose 2 (two) times daily.  16 g  12  . furosemide (LASIX) 40 MG tablet Take 2 tablets (80 mg total) by mouth daily.  180 tablet  3  . guaiFENesin (MUCINEX) 600 MG 12 hr tablet Take 1,200 mg by mouth 2 (two) times daily. As needed      . levocetirizine (XYZAL) 5 MG tablet Take 1 tablet (5 mg total) by mouth every evening.  30 tablet  6  . mexiletine (MEXITIL) 150 MG capsule Take 300 mg by mouth 2 (two) times daily.      . MULTIPLE VITAMINS PO Take 1 capsule by mouth daily.        . Multiple Vitamins-Minerals (PRESERVISION AREDS 2 PO) Take 1 tablet by mouth daily.        . nitroGLYCERIN (NITROSTAT)  0.4 MG SL tablet Place 1 tablet (0.4 mg total) under the tongue every 5 (five) minutes as needed.  25 tablet  2  . pantoprazole (PROTONIX) 40 MG tablet Take 1 tablet (40 mg total) by mouth 2 (two) times daily.  60 tablet  0  . potassium chloride SA (K-DUR,KLOR-CON) 20 MEQ tablet Take 1 tablet (20 mEq total) by mouth daily.  30 tablet  2  . valsartan (DIOVAN) 40 MG tablet Take 1 tablet (40 mg total) by mouth daily.  90 tablet  3  . warfarin (COUMADIN) 5 MG tablet Take 2.5-5 mg by mouth daily. Tue and Sat = 2.5 5 mg all other days         Past Medical History  Diagnosis Date  . Atrial fibrillation     Ablation for a-flutter 05/2000  . Cardiomyopathy     Ischemic.  EF 20%  . Hypertension   . Hyperlipidemia   . Allergy     rhinitis  . Obstructive sleep apnea     CPAP 16  . CAD (coronary artery disease)     PTCA RCA 1997 (Inf/post MI)  . Ventricular tachycardia     ICD, pacer  . Adenomatous colon polyp     Colonoscopy 07/2007---[facility]. [facility] (Rpt 5 yrs recommended)  . Kidney stones     No distal stones on CT 2010  . Myocardial infarction   . Microscopic hematuria   . Diverticulosis of colon 2005    Noted on Colonoscopies in 2005 and 2009 and on CT 2010  . Internal hemorrhoids     Colonoscopy 07/2007  . Hepatic steatosis 2010    Noted on CT abd 2010 and on u/s abd 04/2009 (transaminasemia)  . Allergic rhinitis   . BPH (benign prostatic hypertrophy)     PSA 04/15/07 was 1.44  . History of impaired glucose tolerance     ROS:   All systems reviewed and negative except as noted in the HPI.   Past Surgical History  Procedure Date  . Icd     a) Guidant Contak H170- Virl Axe, MD 07/25/06 (second device) b) Medtronic CRT-D  . Breast surgery 1990    "Tissue removed from breast"  . Umbilical hernia repair   . Hernia repair 1975    bilateral inguinal  . Extracorporeal shock wave lithotripsy   . Atrial ablation surgery      Family History  Problem Relation Age of Onset   . Heart disease Mother   . Cancer Maternal Grandmother     breast  . Coronary artery disease Other     family hx of  . Hyperlipidemia Other     family hx of  . Heart attack Father     4 stents/ pacemaker  . Cancer Father     prostate  . Hypertension Sister   . Goiter Paternal Grandmother   . Nephrolithiasis Son      History   Social History  . Marital Status: Married    Spouse Name: N/A    Number of Children: N/A  . Years of Education: N/A   Occupational History  . Retired    Social History Main Topics  . Smoking status: Former Smoker -- 2.0 packs/day for 15 years    Types: Cigarettes    Quit date: 01/15/1980  . Smokeless tobacco: Never Used  . Alcohol Use: No     Comment: former moderate drinker, none in 8 years  . Drug Use: No  . Sexually Active: Not on file   Other Topics Concern  . Not on file   Social History Narrative  . No narrative on file     BP 104/68  Pulse 61  Ht _0  (1.803 m)  Wt 228 lb 9.6 oz (103.692 kg)  BMI 31.88 kg/m2  SpO2 98%  Physical Exam:  Stable appearing 71 year old man, NAD HEENT: Unremarkable Neck: 8 cm JVD, no thyromegally Lungs:  Clear with no wheezes or rhonchi. A similar rales are present. HEART:  Regular rate rhythm, no murmurs, no rubs, no clicks Abd:  soft, positive bowel sounds, no organomegally, no rebound, no guarding Ext:  2 plus pulses, no edema, no cyanosis, no clubbing Skin:  No rashes no nodules Neuro:  CN II through XII intact, motor grossly intact   DEVICE  Normal device function except for left ventricular lead dysfunction.  See PaceArt for details.   Assess/Plan:

## 2011-12-24 ENCOUNTER — Telehealth: Payer: Self-pay | Admitting: Pulmonary Disease

## 2011-12-24 NOTE — Telephone Encounter (Signed)
Let him know to stay on am and pm dosing another 4 weeks, then try to decrease down to once a day and stay there.

## 2011-12-25 DIAGNOSIS — I2589 Other forms of chronic ischemic heart disease: Secondary | ICD-10-CM | POA: Diagnosis not present

## 2011-12-25 DIAGNOSIS — T82190A Other mechanical complication of cardiac electrode, initial encounter: Secondary | ICD-10-CM | POA: Diagnosis not present

## 2011-12-25 DIAGNOSIS — Z7901 Long term (current) use of anticoagulants: Secondary | ICD-10-CM | POA: Diagnosis not present

## 2011-12-25 DIAGNOSIS — I459 Conduction disorder, unspecified: Secondary | ICD-10-CM | POA: Diagnosis not present

## 2011-12-25 DIAGNOSIS — I4729 Other ventricular tachycardia: Secondary | ICD-10-CM | POA: Diagnosis not present

## 2011-12-25 DIAGNOSIS — Z79899 Other long term (current) drug therapy: Secondary | ICD-10-CM | POA: Diagnosis not present

## 2011-12-25 DIAGNOSIS — I472 Ventricular tachycardia: Secondary | ICD-10-CM | POA: Diagnosis not present

## 2011-12-25 DIAGNOSIS — I5022 Chronic systolic (congestive) heart failure: Secondary | ICD-10-CM | POA: Diagnosis not present

## 2011-12-25 DIAGNOSIS — I10 Essential (primary) hypertension: Secondary | ICD-10-CM | POA: Diagnosis not present

## 2011-12-25 MED ORDER — CEFAZOLIN SODIUM-DEXTROSE 2-3 GM-% IV SOLR
2.0000 g | INTRAVENOUS | Status: DC
  Filled 2011-12-25 (×2): qty 50

## 2011-12-25 MED ORDER — SODIUM CHLORIDE 0.9 % IR SOLN
80.0000 mg | Status: DC
  Filled 2011-12-25: qty 2

## 2011-12-25 NOTE — Telephone Encounter (Signed)
i spoke with pt and is aware of [place] recs. He voiced his understanding and needed nothing further.

## 2011-12-26 ENCOUNTER — Ambulatory Visit (HOSPITAL_COMMUNITY)
Admission: RE | Admit: 2011-12-26 | Discharge: 2011-12-27 | Disposition: A | Discharge status: Home / Self Care | Payer: Medicare Other | Source: Ambulatory Visit | Attending: Internal Medicine | Admitting: Internal Medicine

## 2011-12-26 ENCOUNTER — Encounter (HOSPITAL_COMMUNITY)
Admission: RE | Disposition: A | Discharge status: Home / Self Care | Payer: Self-pay | Source: Ambulatory Visit | Attending: Internal Medicine

## 2011-12-26 ENCOUNTER — Encounter (HOSPITAL_COMMUNITY): Payer: Self-pay | Admitting: General Practice

## 2011-12-26 DIAGNOSIS — I472 Ventricular tachycardia, unspecified: Secondary | ICD-10-CM

## 2011-12-26 DIAGNOSIS — T82190A Other mechanical complication of cardiac electrode, initial encounter: Secondary | ICD-10-CM

## 2011-12-26 DIAGNOSIS — I4729 Other ventricular tachycardia: Secondary | ICD-10-CM

## 2011-12-26 DIAGNOSIS — I4891 Unspecified atrial fibrillation: Secondary | ICD-10-CM

## 2011-12-26 DIAGNOSIS — I739 Peripheral vascular disease, unspecified: Secondary | ICD-10-CM

## 2011-12-26 DIAGNOSIS — T45515A Adverse effect of anticoagulants, initial encounter: Secondary | ICD-10-CM

## 2011-12-26 DIAGNOSIS — I509 Heart failure, unspecified: Secondary | ICD-10-CM

## 2011-12-26 DIAGNOSIS — Z95810 Presence of automatic (implantable) cardiac defibrillator: Secondary | ICD-10-CM

## 2011-12-26 DIAGNOSIS — R042 Hemoptysis: Secondary | ICD-10-CM

## 2011-12-26 DIAGNOSIS — I4589 Other specified conduction disorders: Secondary | ICD-10-CM

## 2011-12-26 DIAGNOSIS — J309 Allergic rhinitis, unspecified: Secondary | ICD-10-CM

## 2011-12-26 DIAGNOSIS — I2589 Other forms of chronic ischemic heart disease: Secondary | ICD-10-CM

## 2011-12-26 DIAGNOSIS — G4733 Obstructive sleep apnea (adult) (pediatric): Secondary | ICD-10-CM

## 2011-12-26 DIAGNOSIS — G3184 Mild cognitive impairment, so stated: Secondary | ICD-10-CM

## 2011-12-26 DIAGNOSIS — R3129 Other microscopic hematuria: Secondary | ICD-10-CM

## 2011-12-26 DIAGNOSIS — Z8639 Personal history of other endocrine, nutritional and metabolic disease: Secondary | ICD-10-CM

## 2011-12-26 DIAGNOSIS — R0609 Other forms of dyspnea: Secondary | ICD-10-CM

## 2011-12-26 DIAGNOSIS — T82198A Other mechanical complication of other cardiac electronic device, initial encounter: Secondary | ICD-10-CM

## 2011-12-26 DIAGNOSIS — I5021 Acute systolic (congestive) heart failure: Secondary | ICD-10-CM

## 2011-12-26 DIAGNOSIS — E785 Hyperlipidemia, unspecified: Secondary | ICD-10-CM

## 2011-12-26 DIAGNOSIS — H9319 Tinnitus, unspecified ear: Secondary | ICD-10-CM

## 2011-12-26 DIAGNOSIS — I459 Conduction disorder, unspecified: Secondary | ICD-10-CM | POA: Insufficient documentation

## 2011-12-26 DIAGNOSIS — Y831 Surgical operation with implant of artificial internal device as the cause of abnormal reaction of the patient, or of later complication, without mention of misadventure at the time of the procedure: Secondary | ICD-10-CM | POA: Insufficient documentation

## 2011-12-26 DIAGNOSIS — Z7901 Long term (current) use of anticoagulants: Secondary | ICD-10-CM

## 2011-12-26 DIAGNOSIS — I10 Essential (primary) hypertension: Secondary | ICD-10-CM

## 2011-12-26 DIAGNOSIS — I2510 Atherosclerotic heart disease of native coronary artery without angina pectoris: Secondary | ICD-10-CM

## 2011-12-26 DIAGNOSIS — D6832 Hemorrhagic disorder due to extrinsic circulating anticoagulants: Secondary | ICD-10-CM

## 2011-12-26 DIAGNOSIS — Z79899 Other long term (current) drug therapy: Secondary | ICD-10-CM | POA: Insufficient documentation

## 2011-12-26 DIAGNOSIS — R7401 Elevation of levels of liver transaminase levels: Secondary | ICD-10-CM

## 2011-12-26 DIAGNOSIS — I5022 Chronic systolic (congestive) heart failure: Secondary | ICD-10-CM

## 2011-12-26 DIAGNOSIS — R7989 Other specified abnormal findings of blood chemistry: Secondary | ICD-10-CM

## 2011-12-26 HISTORY — PX: LEAD REVISION: SHX5945

## 2011-12-26 HISTORY — DX: Heart failure, unspecified: I50.9

## 2011-12-26 LAB — SURGICAL PCR SCREEN
MRSA, PCR: NEGATIVE
Staphylococcus aureus: NEGATIVE

## 2011-12-26 LAB — BASIC METABOLIC PANEL
CO2: 26 mEq/L (ref 19–32)
Calcium: 9 mg/dL (ref 8.4–10.5)
Creatinine, Ser: 1.04 mg/dL (ref 0.50–1.35)
GFR calc non Af Amer: 71 mL/min — ABNORMAL LOW (ref >90)

## 2011-12-26 LAB — PROTIME-INR
INR: 1.21 (ref 0.00–1.49)
Prothrombin Time: 15.1 seconds (ref 11.6–15.2)

## 2011-12-26 SURGERY — LEAD REVISION
Anesthesia: LOCAL | Laterality: Bilateral

## 2011-12-26 MED ORDER — HEPARIN (PORCINE) IN NACL 2-0.9 UNIT/ML-% IJ SOLN
INTRAMUSCULAR | Status: AC
  Filled 2011-12-26: qty 500

## 2011-12-26 MED ORDER — SODIUM CHLORIDE 0.9 % IV SOLN: 250.0000 mL | INTRAVENOUS | Status: DC

## 2011-12-26 MED ORDER — CARVEDILOL 6.25 MG PO TABS
6.2500 mg | ORAL_TABLET | Freq: Two times a day (BID) | ORAL | Status: DC
  Administered 2011-12-26 – 2011-12-27 (×2): 6.25 mg via ORAL
  Filled 2011-12-26 (×4): qty 1

## 2011-12-26 MED ORDER — HYPROMELLOSE (GONIOSCOPIC) 2.5 % OP SOLN
1.0000 [drp] | Freq: Every day | OPHTHALMIC | Status: DC | PRN
  Filled 2011-12-26: qty 15

## 2011-12-26 MED ORDER — IRBESARTAN 75 MG PO TABS
75.0000 mg | ORAL_TABLET | Freq: Every day | ORAL | Status: DC
  Administered 2011-12-26 – 2011-12-27 (×2): 75 mg via ORAL
  Filled 2011-12-26 (×2): qty 1

## 2011-12-26 MED ORDER — SODIUM CHLORIDE 0.9 % IJ SOLN: 3.0000 mL | Freq: Two times a day (BID) | INTRAMUSCULAR | Status: DC

## 2011-12-26 MED ORDER — SODIUM CHLORIDE 0.45 % IV SOLN
INTRAVENOUS | Status: DC
  Administered 2011-12-26: 09:00:00 via INTRAVENOUS

## 2011-12-26 MED ORDER — MUPIROCIN 2 % EX OINT
TOPICAL_OINTMENT | CUTANEOUS | Status: AC
  Administered 2011-12-26: 1 via NASAL
  Filled 2011-12-26: qty 22

## 2011-12-26 MED ORDER — WARFARIN SODIUM 2.5 MG PO TABS
2.5000 mg | ORAL_TABLET | Freq: Every day | ORAL | Status: DC
  Administered 2011-12-26: 5 mg via ORAL

## 2011-12-26 MED ORDER — MIDAZOLAM HCL 5 MG/5ML IJ SOLN
INTRAMUSCULAR | Status: AC
  Filled 2011-12-26: qty 5

## 2011-12-26 MED ORDER — SODIUM CHLORIDE 0.9 % IJ SOLN: 3.0000 mL | INTRAMUSCULAR | Status: DC | PRN

## 2011-12-26 MED ORDER — AMIODARONE HCL 200 MG PO TABS
200.0000 mg | ORAL_TABLET | Freq: Every day | ORAL | Status: DC
  Administered 2011-12-27: 10:00:00 200 mg via ORAL
  Filled 2011-12-26: qty 1

## 2011-12-26 MED ORDER — PANTOPRAZOLE SODIUM 40 MG PO TBEC
40.0000 mg | DELAYED_RELEASE_TABLET | Freq: Two times a day (BID) | ORAL | Status: DC
  Administered 2011-12-26 – 2011-12-27 (×2): 40 mg via ORAL
  Filled 2011-12-26 (×3): qty 1

## 2011-12-26 MED ORDER — ACETAMINOPHEN 325 MG PO TABS
325.0000 mg | ORAL_TABLET | ORAL | Status: DC | PRN
  Administered 2011-12-26 – 2011-12-27 (×2): 650 mg via ORAL
  Filled 2011-12-26 (×3): qty 2

## 2011-12-26 MED ORDER — POLYVINYL ALCOHOL 1.4 % OP SOLN
1.0000 [drp] | OPHTHALMIC | Status: DC | PRN
  Administered 2011-12-26: 22:00:00 1 [drp] via OPHTHALMIC
  Filled 2011-12-26: qty 15

## 2011-12-26 MED ORDER — MUPIROCIN 2 % EX OINT
TOPICAL_OINTMENT | Freq: Two times a day (BID) | CUTANEOUS | Status: DC
  Administered 2011-12-26: 1 via NASAL
  Filled 2011-12-26: qty 22

## 2011-12-26 MED ORDER — YOU HAVE A PACEMAKER BOOK
Freq: Once | Status: DC
  Filled 2011-12-26: qty 1

## 2011-12-26 MED ORDER — POTASSIUM CHLORIDE CRYS ER 20 MEQ PO TBCR
20.0000 meq | EXTENDED_RELEASE_TABLET | Freq: Every day | ORAL | Status: DC
  Administered 2011-12-26 – 2011-12-27 (×2): 20 meq via ORAL
  Filled 2011-12-26 (×2): qty 1

## 2011-12-26 MED ORDER — WARFARIN - PHYSICIAN DOSING INPATIENT: Freq: Every day | Status: DC

## 2011-12-26 MED ORDER — FUROSEMIDE 80 MG PO TABS
80.0000 mg | ORAL_TABLET | Freq: Every day | ORAL | Status: DC
  Administered 2011-12-26 – 2011-12-27 (×2): 80 mg via ORAL
  Filled 2011-12-26 (×2): qty 1

## 2011-12-26 MED ORDER — CEFAZOLIN SODIUM-DEXTROSE 2-3 GM-% IV SOLR
2.0000 g | Freq: Four times a day (QID) | INTRAVENOUS | Status: AC
  Administered 2011-12-26 – 2011-12-27 (×3): 2 g via INTRAVENOUS
  Filled 2011-12-26 (×3): qty 50

## 2011-12-26 MED ORDER — MEXILETINE HCL 150 MG PO CAPS
300.0000 mg | ORAL_CAPSULE | Freq: Two times a day (BID) | ORAL | Status: DC
  Administered 2011-12-26 – 2011-12-27 (×3): 300 mg via ORAL
  Filled 2011-12-26 (×5): qty 2

## 2011-12-26 MED ORDER — WARFARIN SODIUM 2.5 MG PO TABS: 2.5000 mg | ORAL_TABLET | ORAL | Status: DC

## 2011-12-26 MED ORDER — LIDOCAINE HCL (PF) 1 % IJ SOLN
INTRAMUSCULAR | Status: AC
  Filled 2011-12-26: qty 60

## 2011-12-26 MED ORDER — WARFARIN SODIUM 5 MG PO TABS
5.0000 mg | ORAL_TABLET | ORAL | Status: DC
  Administered 2011-12-26: 15:00:00 5 mg via ORAL
  Filled 2011-12-26 (×2): qty 1

## 2011-12-26 MED ORDER — FLUTICASONE PROPIONATE 50 MCG/ACT NA SUSP
2.0000 | Freq: Two times a day (BID) | NASAL | Status: DC
  Administered 2011-12-26 – 2011-12-27 (×2): 2 via NASAL
  Filled 2011-12-26: qty 16

## 2011-12-26 MED ORDER — CHLORHEXIDINE GLUCONATE 4 % EX LIQD
60.0000 mL | Freq: Once | CUTANEOUS | Status: DC
  Filled 2011-12-26: qty 60

## 2011-12-26 MED ORDER — ONDANSETRON HCL 4 MG/2ML IJ SOLN: 4.0000 mg | Freq: Four times a day (QID) | INTRAMUSCULAR | Status: DC | PRN

## 2011-12-26 MED ORDER — ADULT MULTIVITAMIN W/MINERALS CH
1.0000 | ORAL_TABLET | Freq: Every day | ORAL | Status: DC
  Administered 2011-12-26 – 2011-12-27 (×2): 1 via ORAL
  Filled 2011-12-26 (×2): qty 1

## 2011-12-26 MED ORDER — FENTANYL CITRATE 0.05 MG/ML IJ SOLN
INTRAMUSCULAR | Status: AC
  Filled 2011-12-26: qty 2

## 2011-12-26 NOTE — Interval H&P Note (Signed)
History and Physical Interval Note: Patient seen and examined. Chart reviewed. I have discussed the issues of lead revision, removal of old LV lead, possibility that he will require placement of the lead on the contralateral side with tunneling as well as the risks/benefits/goals/expectations and he wishes to proceed.  12/26/2011 8:56 AM  [name]  has presented today for surgery, with the diagnosis of LEAD REV  The various methods of treatment have been discussed with the patient and family. After consideration of risks, benefits and other options for treatment, the patient has consented to  Procedure(s) (LRB) with comments: LEAD REVISION (Bilateral) as a surgical intervention .  The patient's history has been reviewed, patient examined, no change in status, stable for surgery.  I have reviewed the patient's chart and labs.  Questions were answered to the patient's satisfaction.     Mikle Bosworth.D.

## 2011-12-26 NOTE — Progress Notes (Signed)
Orthopedic Tech Progress Note Patient Details:  [name] 04/01/1940 [id number] Patient has arm sling Patient ID: ISEAH PLOUFF, male   DOB: Mar 13, 1940, 71 y.o.   MRN: [id number]   [name] 12/26/2011, 12:51 PM

## 2011-12-26 NOTE — Progress Notes (Signed)
Utilization Review Completed.   Demir Titsworth, RN, BSN Nurse Case Manager  [phone]  

## 2011-12-26 NOTE — Progress Notes (Signed)
PHARMACIST - PHYSICIAN COMMUNICATION  CONCERNING: Pharmacy Care Issues Regarding Warfarin Labs  RECOMMENDATION (Action Taken): A baseline and daily protime for three days has been ordered to meet the [facility] Patient safety goal and comply with the current [place].   The Pharmacy will defer all warfarin dose order changes and follow up of lab results to the prescriber unless an additional order to initiate a "pharmacy Coumadin consult" is placed.  DESCRIPTION:  While hospitalized, to be in compliance with [facility] Patient Safety Goals, all patients on warfarin must have a baseline and/or current protime prior to the administration of warfarin. Pharmacy has received your order for warfarin without these required laboratory assessments.    Abayomi Pattison L. Amada Jupiter, PharmD, [place] Clinical Pharmacist Pager: [phone] Pharmacy: [phone] 12/26/2011 12:52 PM

## 2011-12-26 NOTE — H&P (View-Only) (Signed)
HPI [name] is referred today by Dr. Klein to consider left ventricular lead revision. The patient is a 71-year-old man with chronic systolic heart failure who I initially saw several years ago. At that time he had ventricular lead dislodgment and was referred back to consider insertion of a new left ventricular lead. His heart failure symptoms were at worst class II and his ejection fraction was 35% and we decided that his symptoms did not seem bad enough based on the risk of the procedure. In the interim, he is worsened. His ejection fraction is now 10-15%, and his heart failure symptoms are class IIIB. His left ventricular lead has retracted back, may at this point actually be out of the vascular space. The patient has had multiple procedures previously. Allergies  Allergen Reactions  . Sulfa Antibiotics Rash  . Amiodarone Other (See Comments)    Corneal deposits     Current Outpatient Prescriptions  Medication Sig Dispense Refill  . amiodarone (PACERONE) 200 MG tablet Take 1 tablet (200 mg total) by mouth daily.  90 tablet  3  . carvedilol (COREG) 25 MG tablet Take 1/2 tab in the a.m. And one whole tab in the evening.  180 tablet  3  . eplerenone (INSPRA) 25 MG tablet Take 1 tablet (25 mg total) by mouth daily.  90 tablet  3  . fluticasone (FLONASE) 50 MCG/ACT nasal spray Place 2 sprays into the nose 2 (two) times daily.  16 g  12  . furosemide (LASIX) 40 MG tablet Take 2 tablets (80 mg total) by mouth daily.  180 tablet  3  . guaiFENesin (MUCINEX) 600 MG 12 hr tablet Take 1,200 mg by mouth 2 (two) times daily. As needed      . levocetirizine (XYZAL) 5 MG tablet Take 1 tablet (5 mg total) by mouth every evening.  30 tablet  6  . mexiletine (MEXITIL) 150 MG capsule Take 300 mg by mouth 2 (two) times daily.      . MULTIPLE VITAMINS PO Take 1 capsule by mouth daily.        . Multiple Vitamins-Minerals (PRESERVISION AREDS 2 PO) Take 1 tablet by mouth daily.        . nitroGLYCERIN (NITROSTAT)  0.4 MG SL tablet Place 1 tablet (0.4 mg total) under the tongue every 5 (five) minutes as needed.  25 tablet  2  . pantoprazole (PROTONIX) 40 MG tablet Take 1 tablet (40 mg total) by mouth 2 (two) times daily.  60 tablet  0  . potassium chloride SA (K-DUR,KLOR-CON) 20 MEQ tablet Take 1 tablet (20 mEq total) by mouth daily.  30 tablet  2  . valsartan (DIOVAN) 40 MG tablet Take 1 tablet (40 mg total) by mouth daily.  90 tablet  3  . warfarin (COUMADIN) 5 MG tablet Take 2.5-5 mg by mouth daily. Tue and Sat = 2.5 5 mg all other days         Past Medical History  Diagnosis Date  . Atrial fibrillation     Ablation for a-flutter 05/2000  . Cardiomyopathy     Ischemic.  EF 20%  . Hypertension   . Hyperlipidemia   . Allergy     rhinitis  . Obstructive sleep apnea     CPAP 16  . CAD (coronary artery disease)     PTCA RCA 1997 (Inf/post MI)  . Ventricular tachycardia     ICD, pacer  . Adenomatous colon polyp     Colonoscopy 07/2007---Dr. Magod, [facility] (Rpt 5 yrs recommended)  . Kidney stones     No distal stones on CT 2010  . Myocardial infarction   . Microscopic hematuria   . Diverticulosis of colon 2005    Noted on Colonoscopies in 2005 and 2009 and on CT 2010  . Internal hemorrhoids     Colonoscopy 07/2007  . Hepatic steatosis 2010    Noted on CT abd 2010 and on u/s abd 04/2009 (transaminasemia)  . Allergic rhinitis   . BPH (benign prostatic hypertrophy)     PSA 04/15/07 was 1.44  . History of impaired glucose tolerance     ROS:   All systems reviewed and negative except as noted in the HPI.   Past Surgical History  Procedure Date  . Icd     a) Guidant Contak H170- Virl Axe, MD 07/25/06 (second device) b) Medtronic CRT-D  . Breast surgery 1990    "Tissue removed from breast"  . Umbilical hernia repair   . Hernia repair 1975    bilateral inguinal  . Extracorporeal shock wave lithotripsy   . Atrial ablation surgery      Family History  Problem Relation Age of Onset   . Heart disease Mother   . Cancer Maternal Grandmother     breast  . Coronary artery disease Other     family hx of  . Hyperlipidemia Other     family hx of  . Heart attack Father     4 stents/ pacemaker  . Cancer Father     prostate  . Hypertension Sister   . Goiter Paternal Grandmother   . Nephrolithiasis Son      History   Social History  . Marital Status: Married    Spouse Name: N/A    Number of Children: N/A  . Years of Education: N/A   Occupational History  . Retired    Social History Main Topics  . Smoking status: Former Smoker -- 2.0 packs/day for 15 years    Types: Cigarettes    Quit date: 01/15/1980  . Smokeless tobacco: Never Used  . Alcohol Use: No     Comment: former moderate drinker, none in 8 years  . Drug Use: No  . Sexually Active: Not on file   Other Topics Concern  . Not on file   Social History Narrative  . No narrative on file     BP 104/68  Pulse 61  Ht _0  (1.803 m)  Wt 228 lb 9.6 oz (103.692 kg)  BMI 31.88 kg/m2  SpO2 98%  Physical Exam:  Stable appearing 71 year old man, NAD HEENT: Unremarkable Neck: 8 cm JVD, no thyromegally Lungs:  Clear with no wheezes or rhonchi. A similar rales are present. HEART:  Regular rate rhythm, no murmurs, no rubs, no clicks Abd:  soft, positive bowel sounds, no organomegally, no rebound, no guarding Ext:  2 plus pulses, no edema, no cyanosis, no clubbing Skin:  No rashes no nodules Neuro:  CN II through XII intact, motor grossly intact   DEVICE  Normal device function except for left ventricular lead dysfunction.  See PaceArt for details.   Assess/Plan:

## 2011-12-26 NOTE — Op Note (Signed)
ICD lead revision with contrast venography of the left upper extremity, coronary sinus, new lead insertion and lead extraction performed without immediate complication. F#[id number].

## 2011-12-27 ENCOUNTER — Ambulatory Visit (HOSPITAL_COMMUNITY): Payer: Medicare Other

## 2011-12-27 DIAGNOSIS — Z95810 Presence of automatic (implantable) cardiac defibrillator: Secondary | ICD-10-CM | POA: Diagnosis not present

## 2011-12-27 NOTE — Progress Notes (Signed)
   ELECTROPHYSIOLOGY ROUNDING NOTE    Patient Name: [name] Date of Encounter: 12-26-2012    SUBJECTIVE:Patient feels well.  No chest pain or shortness of breath.  Minimal incisional soreness.  S/p LV lead revision 12-26-2011  TELEMETRY: Reviewed telemetry pt AV paced: Filed Vitals:   12/26/11 1500 12/26/11 1816 12/26/11 2029 12/27/11 0011  BP: 115/71 109/68 97/62 103/61  Pulse:   60 60  Temp:  97.6 F (36.4 C) 98.1 F (36.7 C) 98 F (36.7 C)  TempSrc:   Oral Oral  Resp: _0 Height:      Weight:    222 lb 10.6 oz (101 kg)  SpO2: 97% 98% 96% 94%    Intake/Output Summary (Last 24 hours) at 12/27/11 0626 Last data filed at 12/26/11 2121  Gross per 24 hour  Intake    670 ml  Output   2025 ml  Net  -1355 ml    LABS: Basic Metabolic Panel:  Basename 12/26/11 0822  NA 140  K 3.6  CL 104  CO2 26  GLUCOSE 119*  BUN 15  CREATININE 1.04  CALCIUM 9.0  MG --  PHOS --   INR: 1.27  Radiology/Studies:  Final result pending, leads in stable position.  PHYSICAL EXAM Left chest without hematoma or ecchymosis  DEVICE INTERROGATION: Device interrogation pending  Wound care, arm mobility, restrictions reviewed with patient.  Routine follow up scheduled.   EP Attending  Patient seen and examined. Agree with above. [place] for discharge. Usual followup. Mikle Bosworth.D.

## 2011-12-27 NOTE — Op Note (Signed)
[name], [name] NO.:  [id number]  MEDICAL RECORD NO.:  [id number]  LOCATION:  6523                         FACILITY:  [facility]  PHYSICIAN:  Champ Mungo. Lovena Le, MD    DATE OF BIRTH:  1940-01-28  DATE OF PROCEDURE:  12/26/2011 DATE OF DISCHARGE:                              OPERATIVE REPORT   PROCEDURE PERFORMED:  Revision of a left ventricular lead with removal of a previously implanted left ventricular lead with extraction, insertion of a new left ventricular lead utilizing contrast venography of the left upper extremity as well as the coronary sinus with ICD pocket revision.  INTRODUCTION:  The patient is a 71 year old male with an ischemic cardiomyopathy, ventricular tachycardia, chronic systolic heart failure, high-grade heart block, his un-paced QRS duration was 220 milliseconds. This was with the left bundle-branch block QRS morphology.  The patient had dislodgement of his left ventricular lead approximately 2 years ago. Initially, his heart failure was not particularly bad, but has worsened over time and now class III with an EF of 10%.  He is referred now for revision of his lead.  Of note, the generator has an 8-year battery longevity.  PROCEDURE:  After informed consent was obtained, the patient was taken to the Diagnostic EP Lab in the fasting state.  After usual preparation and draping, intravenous fentanyl and midazolam was given for sedation. A 30 mL of lidocaine was infiltrated into the left infraclavicular region.  A 7-cm incision was carried out over this region, and electrocautery was utilized to dissect down the fascial plane.  Prior to this, 10 mL of contrast was injected into the left upper extremity venous system demonstrating a patent left subclavian vein.  The vein was then punctured and the guidewire was advanced into the vein followed by a 9.5-French peel-away sheath.  The Medtronic MB2 guiding catheter along with a 6-French hexapolar  EP catheter was utilized and advanced into the right atrium.  Venography of the coronary sinus was carried out after the coronary sinus was cannulated with modest difficulty.  The right atrium was enlarged.  Venography demonstrated a satisfactory lateral vein and no additional options for LV lead placement.  At this point, the 0.014 angioplasty guidewire was advanced into the lateral vein without particular difficulty.  The Medtronic model 4194 88-cm bipolar pacing lead, serial number [id number] was then advanced over the angioplasty guidewire and into the lateral vein of the left ventricle. Mapping was carried out in this vein extensively.  In the more proximal portions of the vein where lead stability was in question, there was clear-cut diaphragmatic stimulation and a high threshold.  In the midportion of the vein, there was still diaphragmatic stimulation, though the pacing thresholds were somewhat improved.  Finally, in the more distal portion of the vein, approximately two-thirds from base to apex, but not further, there was no diaphragmatic stimulation and the pacing threshold was around 2.5 volts at 0.8 milliseconds.  While these pacing thresholds were not ideal, there were only option at this point and in this location that the lead was liberated from the guiding catheter in the usual manner and the lead was secured to the subpectoral fascia  with a figure-of-eight silk suture and the sewing sleeve was secured with silk suture.  The pocket was irrigated with antibiotic irrigation.  Electrocautery was then utilized to enter the ICD pocket and removed the generator and leads without difficulty.  Electrocautery was utilized to free up the dense fibrous adhesions particularly around the left ventricular lead.  The left ventricular lead was then extracted utilizing gentle traction.  There were no hemodynamic sequelae with this.  Next, the pocket was revised to accommodate the new lead  and the pocket was then irrigated and electrocautery was utilized to assure hemostasis.  The defibrillator was connected to the left ventricular lead and placed back in the subcutaneous pocket along with the atrial and the RV leads.  At this point, the pocket was additionally irrigated with antibiotic irrigation, and the incision was closed with 2-0 and 3-0 Vicryl.  Defibrillation threshold testing was not elected to be carried out as the RV defibrillator lead was not disconnected from the defibrillator itself.  The incision was then closed with 2-0 and 3-0 Vicryl.  Benzoin and Steri-Strips were painted on the skin.  A pressure dressing was applied.  Then, the patient was returned to his room in satisfactory condition.  COMPLICATIONS:  There were no immediate procedure complications.  RESULTS:  This demonstrates successful extraction of an old left ventricular lead, which had retracted back into the subclavian vein.  It demonstrates successful insertion of a new left ventricular lead into the lateral vein of the left ventricle and successful pocket revision and contrast venography all without immediate procedure complication.     Champ Mungo. Lovena Le, MD     GWT/MEDQ  D:  12/26/2011  T:  12/27/2011  Job:  [id number]  cc:   Deboraha Sprang, MD, [facility]

## 2011-12-27 NOTE — Discharge Summary (Signed)
ELECTROPHYSIOLOGY PROCEDURE DISCHARGE SUMMARY    Patient ID: [name],  MRN: [id number], DOB/AGE: 08-31-1940 71 y.o.  Admit date: 12/26/2011 Discharge date: 12/27/2011  Primary Care Physician: Jefferey Pica Primary Cardiologist: Kirk Ruths, MD Electrophysiologist: Virl Axe, MD  Primary Discharge Diagnosis:  LV lead dislodgement status post lead revision this admission  Secondary Discharge Diagnosis:  1.  Atrial fibrillation and atrial flutter, status post flutter ablation 05-2000 2.  Ischemic cardiomyopathy status post CRT-D implant with subsequent LV lead dislodgement 3.  Hypertension 4.  Hyperlipidemia 5.  Obstructive sleep apnea- on CPAP 6.  Coronary artery disease status post PTCA to RCA in 1997 7.  Ventricular tachycardia with appropriate treatment from ICD 8.  Microscopic hematuria 9.  Hepatic steatosis 10.  BPH  Procedures This Admission:  1.  Successful extraction of an old left ventricular lead, which had retracted back into the subclavian vein. It demonstrates successful insertion of a new left ventricular lead into the lateral vein of the left ventricle and successful pocket revision and contrast venography all without immediate procedure complication.  The patient's previously implanted [employer] CRT-D was used (estimated longevity 8 years).   2.  Chest x-ray on 12-27-2011 demonstrated no pneumothorax.   Brief HPI: [name] was referred by Dr. Caryl Comes to consider left ventricular lead revision. The patient is a 71 year old man with chronic systolic heart failure. Several years ago, he had ventricular lead dislodgment and was referred back to consider insertion of a new left ventricular lead. His heart failure symptoms were at worst class II and his ejection fraction was 35% and we decided that his symptoms did not seem bad enough based on the risk of the procedure. In the interim, he is worsened. His ejection fraction is now 10-15%, and his  heart failure symptoms are class IIIB. His left ventricular lead has retracted back, may at this point actually be out of the vascular space. The patient has had multiple procedures previously.  Risks, benefits, and alternatives to LV lead revision were discussed with the patient who wished to proceed.   Hospital Course:  The patient was admitted and underwent LV lead revision with details as outlined above.   He was monitored on telemetry overnight which demonstrated AV pacing.  Left chest was without hematoma or ecchymosis.  The device was interrogated and found to be functioning normally.  CXR was obtained and demonstrated no pneumothorax status post device implantation.  Wound care, arm mobility, and restrictions were reviewed with the patient.  Dr Lovena Le examined the patient and considered them stable for discharge to home.    Discharge Vitals: Blood pressure 112/65, pulse 60, temperature 97.5 F (36.4 C), temperature source Oral, resp. rate 16, height _0  (1.803 m), weight 222 lb 10.6 oz (101 kg), SpO2 95.00%.    Labs:   Lab Results  Component Value Date   WBC 6.2 12/19/2011   HGB 15.0 12/19/2011   HCT 44.8 12/19/2011   MCV 101.8* 12/19/2011   PLT 267.0 12/19/2011     Lab 12/26/11 0822  NA 140  K 3.6  CL 104  CO2 26  BUN 15  CREATININE 1.04  CALCIUM 9.0  PROT --  BILITOT --  ALKPHOS --  ALT --  AST --  GLUCOSE 119*    Discharge Medications:    Medication List     As of 12/27/2011 10:11 AM    TAKE these medications         amiodarone 200 MG tablet  Commonly known as: PACERONE   Take 1 tablet (200 mg total) by mouth daily.      carvedilol 25 MG tablet   Commonly known as: COREG   Take 6.25 mg by mouth 2 (two) times daily with a meal. Take 1/4 tab twice daily.      eplerenone 25 MG tablet   Commonly known as: INSPRA   Take 1 tablet (25 mg total) by mouth daily.      fluticasone 50 MCG/ACT nasal spray   Commonly known as: FLONASE   Place 2 sprays into the  nose 2 (two) times daily.      furosemide 40 MG tablet   Commonly known as: LASIX   Take 2 tablets (80 mg total) by mouth daily.      hydroxypropyl methylcellulose 2.5 % ophthalmic solution   Commonly known as: ISOPTO TEARS   Place 1 drop into both eyes daily as needed. For dry eyes      levocetirizine 5 MG tablet   Commonly known as: XYZAL   Take 1 tablet (5 mg total) by mouth every evening.      mexiletine 150 MG capsule   Commonly known as: MEXITIL   Take 2 capsules (300 mg total) by mouth 2 (two) times daily.      multivitamin with minerals Tabs   Take 1 tablet by mouth daily.      nitroGLYCERIN 0.4 MG SL tablet   Commonly known as: NITROSTAT   Place 1 tablet (0.4 mg total) under the tongue every 5 (five) minutes as needed.      pantoprazole 40 MG tablet   Commonly known as: PROTONIX   Take 1 tablet (40 mg total) by mouth 2 (two) times daily.      potassium chloride SA 20 MEQ tablet   Commonly known as: K-DUR,KLOR-CON   Take 1 tablet (20 mEq total) by mouth daily.      valsartan 40 MG tablet   Commonly known as: DIOVAN   Take 1 tablet (40 mg total) by mouth daily.      warfarin 5 MG tablet   Commonly known as: COUMADIN   Take 2.5-5 mg by mouth daily. Tue and Sat = 2.5  5 mg all other days          Disposition:  Discharge Orders    Future Appointments: Provider: Department: Dept Phone: Center:   01/02/2012 11:00 AM [facility] [phone] None   01/09/2012 11:00 AM [facility] [place]) [phone] [facility]   01/28/2012 11:00 AM Kathee Delton, MD [facility] [phone] None   02/13/2012 10:45 AM [name], MD [facility] [place]) [phone] [facility]   03/05/2012 1:15 PM [name], MD [facility] [phone] None   04/07/2012 10:45 AM Deboraha Sprang, MD [place])  [phone] [facility]     Future Orders Please Complete By Expires   Diet - low sodium heart healthy      Increase activity slowly      Discharge instructions      Comments:   Please see post ICD/lead revision discharge instructions     Follow-up Information    Follow up with Virl Axe, MD. On 04/07/2012. (At 10:45 AM)    Contact information:   [address] [place] [address] [phone]      Follow up with Kirk Ruths, MD. On 02/13/2012. (At  10:45 AM )    Contact information:   [address] [place] [address] [phone]      Follow up with [facility]. On 01/02/2012. (At 11:00 AM for coumadin follow-up (INR check))    Contact information:   [address] [place] [address] [phone]      Follow up with [facility]). On 01/09/2012. (At 11:00 AM for wound check)    Contact information:   [address] [place] [address] [phone]         Duration of Discharge Encounter: Greater than 30 minutes including physician time.  Signed, Chanetta Marshall, RN, BSN 12/27/2011, 10:11 AM

## 2012-01-02 ENCOUNTER — Other Ambulatory Visit (INDEPENDENT_AMBULATORY_CARE_PROVIDER_SITE_OTHER): Payer: Medicare Other

## 2012-01-02 ENCOUNTER — Ambulatory Visit (INDEPENDENT_AMBULATORY_CARE_PROVIDER_SITE_OTHER): Payer: Medicare Other

## 2012-01-02 ENCOUNTER — Other Ambulatory Visit: Payer: Self-pay | Admitting: *Deleted

## 2012-01-02 DIAGNOSIS — I4891 Unspecified atrial fibrillation: Secondary | ICD-10-CM

## 2012-01-02 DIAGNOSIS — I10 Essential (primary) hypertension: Secondary | ICD-10-CM | POA: Diagnosis not present

## 2012-01-02 DIAGNOSIS — Z7901 Long term (current) use of anticoagulants: Secondary | ICD-10-CM | POA: Diagnosis not present

## 2012-01-02 LAB — BASIC METABOLIC PANEL
BUN: 18 mg/dL (ref 6–23)
Chloride: 105 mEq/L (ref 96–112)
Glucose, Bld: 91 mg/dL (ref 70–99)
Potassium: 3.8 mEq/L (ref 3.5–5.1)

## 2012-01-09 ENCOUNTER — Encounter: Payer: Self-pay | Admitting: Internal Medicine

## 2012-01-09 ENCOUNTER — Ambulatory Visit (INDEPENDENT_AMBULATORY_CARE_PROVIDER_SITE_OTHER): Payer: Medicare Other | Admitting: *Deleted

## 2012-01-09 ENCOUNTER — Ambulatory Visit (INDEPENDENT_AMBULATORY_CARE_PROVIDER_SITE_OTHER)
Admission: RE | Admit: 2012-01-09 | Discharge: 2012-01-09 | Disposition: A | Discharge status: Home / Self Care | Payer: Medicare Other | Source: Ambulatory Visit | Attending: Internal Medicine | Admitting: Internal Medicine

## 2012-01-09 DIAGNOSIS — I5021 Acute systolic (congestive) heart failure: Secondary | ICD-10-CM | POA: Diagnosis not present

## 2012-01-09 DIAGNOSIS — Z95810 Presence of automatic (implantable) cardiac defibrillator: Secondary | ICD-10-CM | POA: Diagnosis not present

## 2012-01-09 DIAGNOSIS — I509 Heart failure, unspecified: Secondary | ICD-10-CM | POA: Diagnosis not present

## 2012-01-09 DIAGNOSIS — I700 Atherosclerosis of aorta: Secondary | ICD-10-CM | POA: Diagnosis not present

## 2012-01-09 LAB — ICD DEVICE OBSERVATION
AL THRESHOLD: 0.7 V
ATRIAL PACING ICD: 99 pct
DEVICE MODEL ICD: 492371
LV LEAD IMPEDENCE ICD: 754 Ohm
RV LEAD IMPEDENCE ICD: 745 Ohm
TZON-0002FASTVT: 4
TZON-0003FASTVT: 333.3 ms

## 2012-01-09 NOTE — Progress Notes (Signed)
Wound check-ICD

## 2012-01-14 ENCOUNTER — Telehealth: Payer: Self-pay | Admitting: Internal Medicine

## 2012-01-14 DIAGNOSIS — R209 Unspecified disturbances of skin sensation: Secondary | ICD-10-CM | POA: Diagnosis not present

## 2012-01-14 DIAGNOSIS — B351 Tinea unguium: Secondary | ICD-10-CM | POA: Diagnosis not present

## 2012-01-14 DIAGNOSIS — L570 Actinic keratosis: Secondary | ICD-10-CM | POA: Diagnosis not present

## 2012-01-14 DIAGNOSIS — D485 Neoplasm of uncertain behavior of skin: Secondary | ICD-10-CM | POA: Diagnosis not present

## 2012-01-14 DIAGNOSIS — M5430 Sciatica, unspecified side: Secondary | ICD-10-CM | POA: Diagnosis not present

## 2012-01-14 DIAGNOSIS — M999 Biomechanical lesion, unspecified: Secondary | ICD-10-CM | POA: Diagnosis not present

## 2012-01-14 DIAGNOSIS — C44320 Squamous cell carcinoma of skin of unspecified parts of face: Secondary | ICD-10-CM | POA: Diagnosis not present

## 2012-01-14 NOTE — Telephone Encounter (Signed)
Pt was told to call you by amber and paula re a poss wire that might have come loose had device placed 12-26-11,  Had xray done at [facility] 3-4 days ago

## 2012-01-14 NOTE — Telephone Encounter (Signed)
Dr Lovena Le reviewed and CXR looks same as post op

## 2012-01-16 ENCOUNTER — Ambulatory Visit (INDEPENDENT_AMBULATORY_CARE_PROVIDER_SITE_OTHER): Payer: Medicare Other | Admitting: *Deleted

## 2012-01-16 DIAGNOSIS — Z7901 Long term (current) use of anticoagulants: Secondary | ICD-10-CM | POA: Diagnosis not present

## 2012-01-16 DIAGNOSIS — I4891 Unspecified atrial fibrillation: Secondary | ICD-10-CM | POA: Diagnosis not present

## 2012-01-16 LAB — POCT INR: INR: 4.1

## 2012-01-22 ENCOUNTER — Other Ambulatory Visit: Payer: Self-pay | Admitting: Pulmonary Disease

## 2012-01-22 MED ORDER — PANTOPRAZOLE SODIUM 40 MG PO TBEC: 40.0000 mg | DELAYED_RELEASE_TABLET | Freq: Two times a day (BID) | ORAL | Status: DC

## 2012-01-22 NOTE — Telephone Encounter (Signed)
Kathee Delton, MD 12/24/2011 6:10 PM Signed  Let him know to stay on am and pm dosing another 4 weeks, then try to decrease down to once a day and stay there.  [facility]  rx sent per above note.

## 2012-01-23 DIAGNOSIS — N138 Other obstructive and reflux uropathy: Secondary | ICD-10-CM | POA: Diagnosis not present

## 2012-01-23 DIAGNOSIS — N200 Calculus of kidney: Secondary | ICD-10-CM | POA: Diagnosis not present

## 2012-01-23 DIAGNOSIS — N401 Enlarged prostate with lower urinary tract symptoms: Secondary | ICD-10-CM | POA: Diagnosis not present

## 2012-01-25 ENCOUNTER — Encounter: Payer: Self-pay | Admitting: Family Medicine

## 2012-01-28 ENCOUNTER — Ambulatory Visit (INDEPENDENT_AMBULATORY_CARE_PROVIDER_SITE_OTHER): Payer: Medicare Other | Admitting: Pulmonary Disease

## 2012-01-28 ENCOUNTER — Encounter: Payer: Self-pay | Admitting: Pulmonary Disease

## 2012-01-28 VITALS — BP 112/78 | HR 60 | Temp 97.0°F | Ht 72.0 in | Wt 225.2 lb

## 2012-01-28 DIAGNOSIS — G4733 Obstructive sleep apnea (adult) (pediatric): Secondary | ICD-10-CM

## 2012-01-28 MED ORDER — IPRATROPIUM BROMIDE 0.06 % NA SOLN: 2.0000 | Freq: Four times a day (QID) | NASAL | Status: DC

## 2012-01-28 MED ORDER — IPRATROPIUM BROMIDE 0.06 % NA SOLN: 2.0000 | Freq: Every day | NASAL | Status: DC

## 2012-01-28 NOTE — Progress Notes (Signed)
  Subjective:    Patient ID: [name], male    DOB: 1940-05-04, 72 y.o.   MRN: [id number]  HPI The patient comes in today for followup of his complex sleep apnea.  He is wearing CPAP compliantly, and has done very well with the device.  He has no issues with pressure, and feels that he sleeps well with adequate daytime alertness.  His only complaint today is that of ongoing mouth dryness, but I think it is multifactorial.  If he tries to turn a heated humidifier higher, he gets rhinorrhea.  Chronic rhinitis has been an ongoing issue for him.   Review of Systems  Constitutional: Negative for fever and unexpected weight change.  HENT: Positive for congestion, rhinorrhea, sneezing, postnasal drip and sinus pressure. Negative for ear pain, nosebleeds, sore throat, trouble swallowing and dental problem.        Mouth dryness with CPAP machine  Eyes: Negative for redness and itching.  Respiratory: Negative for cough, chest tightness, shortness of breath and wheezing.   Cardiovascular: Negative for palpitations and leg swelling.  Gastrointestinal: Negative for nausea and vomiting.  Genitourinary: Negative for dysuria.  Musculoskeletal: Positive for back pain. Negative for joint swelling.  Skin: Positive for rash ( eczema---OTC cream from Dermatologist).  Neurological: Negative for headaches.  Hematological: Does not bruise/bleed easily.  Psychiatric/Behavioral: Negative for dysphoric mood. The patient is not nervous/anxious.        Objective:   Physical Exam Overweight male in no acute distress Nose without purulence or discharge noted No skin breakdown or pressure necrosis from the CPAP mask Neck without lymphadenopathy or thyromegaly Lower extremities with no edema, no cyanosis Alert and oriented, moves all 4 extremities.  Does not appear to be sleepy.       Assessment & Plan:

## 2012-01-28 NOTE — Patient Instructions (Addendum)
Will change you over to a full face mask to see if helps mouth dryness Take flonase in the morning, and will try you on atrovent nasal spray in the evening before putting on cpap.  Take 2 sprays each nostril Turn heat up on humidifier to help with mouth dryness, and hopefully the new nasal spray will help with postnasal drip Work on weight loss. followup with me in one year if doing well, but call if still having issues.

## 2012-01-28 NOTE — Assessment & Plan Note (Signed)
The patient is wearing CPAP compliantly, and feels that it is helping his sleep.  However, he is having issues with severe mouth dryness, most of which I suspect is secondary to his medications.  He is wearing a nasal mask with a chin strap, but admits that he still has mouth openings at times.  When he turns the heat up on his humidifier, he then gets increased rhinorrhea.  At this point, I would like to try him on a full face mask, asked him to increase the heat on his humidifier, and we'll try Atrovent nasal spray to see if we can prevent worsening rhinorrhea.  The patient is to call me if he continues to have issues.  I've also asked him to work aggressively on weight loss.

## 2012-01-28 NOTE — Addendum Note (Signed)
Addended by: Ailene Ards R on: 01/28/2012 11:46 AM   Modules accepted: Orders

## 2012-01-30 ENCOUNTER — Ambulatory Visit (INDEPENDENT_AMBULATORY_CARE_PROVIDER_SITE_OTHER): Payer: Medicare Other | Admitting: *Deleted

## 2012-01-30 DIAGNOSIS — Z7901 Long term (current) use of anticoagulants: Secondary | ICD-10-CM

## 2012-01-30 DIAGNOSIS — I4891 Unspecified atrial fibrillation: Secondary | ICD-10-CM | POA: Diagnosis not present

## 2012-02-04 DIAGNOSIS — C44320 Squamous cell carcinoma of skin of unspecified parts of face: Secondary | ICD-10-CM | POA: Diagnosis not present

## 2012-02-13 ENCOUNTER — Ambulatory Visit (INDEPENDENT_AMBULATORY_CARE_PROVIDER_SITE_OTHER): Payer: Medicare Other | Admitting: Cardiology

## 2012-02-13 ENCOUNTER — Other Ambulatory Visit (INDEPENDENT_AMBULATORY_CARE_PROVIDER_SITE_OTHER): Payer: Medicare Other

## 2012-02-13 ENCOUNTER — Ambulatory Visit (INDEPENDENT_AMBULATORY_CARE_PROVIDER_SITE_OTHER): Payer: Medicare Other

## 2012-02-13 ENCOUNTER — Encounter: Payer: Self-pay | Admitting: Cardiology

## 2012-02-13 VITALS — BP 114/74 | HR 61 | Ht 72.0 in | Wt 225.0 lb

## 2012-02-13 DIAGNOSIS — Z95810 Presence of automatic (implantable) cardiac defibrillator: Secondary | ICD-10-CM

## 2012-02-13 DIAGNOSIS — I2510 Atherosclerotic heart disease of native coronary artery without angina pectoris: Secondary | ICD-10-CM

## 2012-02-13 DIAGNOSIS — Z7901 Long term (current) use of anticoagulants: Secondary | ICD-10-CM | POA: Diagnosis not present

## 2012-02-13 DIAGNOSIS — I5022 Chronic systolic (congestive) heart failure: Secondary | ICD-10-CM

## 2012-02-13 DIAGNOSIS — I059 Rheumatic mitral valve disease, unspecified: Secondary | ICD-10-CM | POA: Diagnosis not present

## 2012-02-13 DIAGNOSIS — I429 Cardiomyopathy, unspecified: Secondary | ICD-10-CM

## 2012-02-13 DIAGNOSIS — I4891 Unspecified atrial fibrillation: Secondary | ICD-10-CM | POA: Diagnosis not present

## 2012-02-13 DIAGNOSIS — I428 Other cardiomyopathies: Secondary | ICD-10-CM | POA: Diagnosis not present

## 2012-02-13 DIAGNOSIS — R0989 Other specified symptoms and signs involving the circulatory and respiratory systems: Secondary | ICD-10-CM

## 2012-02-13 LAB — HEPATIC FUNCTION PANEL
AST: 37 U/L (ref 0–37)
Albumin: 3.8 g/dL (ref 3.5–5.2)
Alkaline Phosphatase: 56 U/L (ref 39–117)
Bilirubin, Direct: 0 mg/dL (ref 0.0–0.3)

## 2012-02-13 MED ORDER — CARVEDILOL 25 MG PO TABS: 12.5000 mg | ORAL_TABLET | Freq: Two times a day (BID) | ORAL | Status: DC

## 2012-02-13 MED ORDER — AMIODARONE HCL 200 MG PO TABS: 200.0000 mg | ORAL_TABLET | Freq: Every day | ORAL | Status: DC

## 2012-02-13 NOTE — Assessment & Plan Note (Signed)
Continue amiodarone. He had a recent chest x-ray. Check TSH and liver functions. Continue mexiletine.

## 2012-02-13 NOTE — Assessment & Plan Note (Signed)
Not on aspirin due to need for Coumadin.

## 2012-02-13 NOTE — Assessment & Plan Note (Signed)
Management per electrophysiology.

## 2012-02-13 NOTE — Assessment & Plan Note (Signed)
Continue diet. Not on a statin as increased liver functions in combination with amiodarone.

## 2012-02-13 NOTE — Progress Notes (Signed)
HPI: [name] is a pleasant gentleman with a history of coronary artery disease, mixed ischemic and nonischemic cardiomyopathy, history of ICD, paroxysmal atrial fibrillation, and ventricular tachycardia. Previous carotid Dopplers in April 2006 showed 0-39% stenosis bilaterally. Previous abdominal ultrasound in April 2006 showed no aneurysm. Last cardiac catheterization in October 2008 showed an occluded right coronary artery with left-to-right collateralization. Ejection fraction is 20%. There was no other coronary disease noted. Last Myoview in Dec 2011 showed prior inferior infarct but no ischemia. His ejection fraction was 19%. ABIs in December of 2011 were normal. An echocardiogram in Sept 2013 showed an ejection fraction of 15-20%, moderate to severe mitral regurgitation, moderate to severe left atrial enlargement, mild right ventricular and right atrial enlargement. The patient has had upgrade of his device to CRT-D. Patient had revision of his left ventricular lead in December 2013. Since that time he feels much better. He denies dyspnea, chest pain, palpitations or syncope.  Current Outpatient Prescriptions  Medication Sig Dispense Refill  . amiodarone (PACERONE) 200 MG tablet Take 1 tablet (200 mg total) by mouth daily.  90 tablet  3  . carvedilol (COREG) 25 MG tablet Take 6.25 mg by mouth 2 (two) times daily with a meal. Take 1/4 tab twice daily.      [name] eplerenone (INSPRA) 25 MG tablet Take 25 mg by mouth at bedtime.      . fluticasone (FLONASE) 50 MCG/ACT nasal spray Place 2 sprays into the nose 2 (two) times daily.  16 g  12  . furosemide (LASIX) 40 MG tablet Take 2 tablets (80 mg total) by mouth daily.  180 tablet  3  . hydroxypropyl methylcellulose (ISOPTO TEARS) 2.5 % ophthalmic solution Place 1 drop into both eyes daily as needed. For dry eyes      . ipratropium (ATROVENT) 0.06 % nasal spray Place 2 sprays into the nose at bedtime.  15 mL  6  . levocetirizine (XYZAL) 5 MG tablet  Take 1 tablet (5 mg total) by mouth every evening.  30 tablet  6  . mexiletine (MEXITIL) 150 MG capsule Take 2 capsules (300 mg total) by mouth 2 (two) times daily.  120 capsule  12  . Multiple Vitamin (MULTIVITAMIN WITH MINERALS) TABS Take 1 tablet by mouth daily.      . nitroGLYCERIN (NITROSTAT) 0.4 MG SL tablet Place 1 tablet (0.4 mg total) under the tongue every 5 (five) minutes as needed.  25 tablet  2  . pantoprazole (PROTONIX) 40 MG tablet Take 1 tablet (40 mg total) by mouth 2 (two) times daily.  60 tablet  2  . potassium chloride SA (K-DUR,KLOR-CON) 20 MEQ tablet Take 1 tablet (20 mEq total) by mouth daily.  30 tablet  2  . valsartan (DIOVAN) 40 MG tablet Take 1 tablet (40 mg total) by mouth daily.  90 tablet  3  . warfarin (COUMADIN) 5 MG tablet Take 2.5-5 mg by mouth daily. Tue and Sat = 2.5 5 mg all other days         Past Medical History  Diagnosis Date  . Atrial fibrillation     Ablation for a-flutter 05/2000  . Cardiomyopathy     Ischemic.  EF 20%  . Hypertension   . Hyperlipidemia   . Allergy     rhinitis  . Obstructive sleep apnea     CPAP 16  . CAD (coronary artery disease)     PTCA RCA 1997 (Inf/post MI)  . Ventricular tachycardia  ICD, pacer  . Adenomatous colon polyp     Colonoscopy 07/2007---Dr. Watt Climes, [facility] (Rpt 5 yrs recommended)  . Kidney stones     No distal stones on CT 2010  . Myocardial infarction   . Microscopic hematuria     Cysto neg 02/2009 except for BPH  . Diverticulosis of colon 2005    Noted on Colonoscopies in 2005 and 2009 and on CT 2010  . Internal hemorrhoids     Colonoscopy 07/2007  . Hepatic steatosis 2010    Noted on CT abd 2010 and on u/s abd 04/2009 (transaminasemia)  . Allergic rhinitis   . BPH (benign prostatic hypertrophy)     PSAs ok (followed by urologist)  . History of impaired glucose tolerance   . Shortness of breath   . CHF (congestive heart failure)   . Pacemaker     pacermaker /ICD    Past Surgical History    Procedure Date  . Icd     a) Guidant Contak H170- Virl Axe, MD 07/25/06 (second device) b) Medtronic CRT-D  . Breast surgery 1990    "Tissue removed from breast"  . Umbilical hernia repair   . Hernia repair 1975    bilateral inguinal  . Extracorporeal shock wave lithotripsy   . Atrial ablation surgery   . Cardiac catheterization 12/26/2011    History   Social History  . Marital Status: Married    Spouse Name: N/A    Number of Children: N/A  . Years of Education: N/A   Occupational History  . Retired    Social History Main Topics  . Smoking status: Former Smoker -- 2.0 packs/day for 15 years    Types: Cigarettes    Quit date: 01/15/1980  . Smokeless tobacco: Never Used  . Alcohol Use: No     Comment: former moderate drinker, none in 8 years  . Drug Use: No  . Sexually Active: Not on file   Other Topics Concern  . Not on file   Social History Narrative  . No narrative on file    ROS: no fevers or chills, productive cough, hemoptysis, dysphasia, odynophagia, melena, hematochezia, dysuria, hematuria, rash, seizure activity, orthopnea, PND, pedal edema, claudication. Remaining systems are negative.  Physical Exam: Well-developed well-nourished in no acute distress.  Skin is warm and dry.  HEENT is normal.  Neck is supple.  Chest is clear to auscultation with normal expansion.  Cardiovascular exam is regular rate and rhythm.  Abdominal exam nontender or distended. No masses palpated. Extremities show no edema. neuro grossly intact

## 2012-02-13 NOTE — Patient Instructions (Addendum)
Your physician wants you to follow-up in: [place] will receive a reminder letter in the mail two months in advance. If you don't receive a letter, please call our office to schedule the follow-up appointment.   Your physician has requested that you have an echocardiogram. Echocardiography is a painless test that uses sound waves to create images of your heart. It provides your doctor with information about the size and shape of your heart and how well your heart's chambers and valves are working. This procedure takes approximately one hour. There are no restrictions for this procedure.   Your physician recommends that you HAVE LAB WORK TODAY  INCREASE CARVEDILOL 12.5 MG TWICE DAILY

## 2012-02-13 NOTE — Assessment & Plan Note (Signed)
euvolemic on examination. Continue present medications. Check potassium and renal function.

## 2012-02-13 NOTE — Assessment & Plan Note (Signed)
Continued amiodarone and Coumadin.

## 2012-02-13 NOTE — Assessment & Plan Note (Signed)
Patient is status post lead revision. Hopefully this will have improved LV function and decreased mitral regurgitation. Repeat echocardiogram status post procedure. Continue ARB. Increase carvedilol to 12.5 mg by mouth twice a day.

## 2012-02-19 ENCOUNTER — Ambulatory Visit (HOSPITAL_COMMUNITY): Payer: Medicare Other | Attending: Cardiology | Admitting: Radiology

## 2012-02-19 DIAGNOSIS — I472 Ventricular tachycardia, unspecified: Secondary | ICD-10-CM | POA: Insufficient documentation

## 2012-02-19 DIAGNOSIS — I2589 Other forms of chronic ischemic heart disease: Secondary | ICD-10-CM | POA: Insufficient documentation

## 2012-02-19 DIAGNOSIS — I4729 Other ventricular tachycardia: Secondary | ICD-10-CM | POA: Diagnosis not present

## 2012-02-19 DIAGNOSIS — I059 Rheumatic mitral valve disease, unspecified: Secondary | ICD-10-CM

## 2012-02-19 DIAGNOSIS — I10 Essential (primary) hypertension: Secondary | ICD-10-CM | POA: Insufficient documentation

## 2012-02-19 DIAGNOSIS — I429 Cardiomyopathy, unspecified: Secondary | ICD-10-CM

## 2012-02-19 DIAGNOSIS — I509 Heart failure, unspecified: Secondary | ICD-10-CM | POA: Insufficient documentation

## 2012-02-19 DIAGNOSIS — I428 Other cardiomyopathies: Secondary | ICD-10-CM

## 2012-02-19 DIAGNOSIS — E785 Hyperlipidemia, unspecified: Secondary | ICD-10-CM | POA: Diagnosis not present

## 2012-02-19 DIAGNOSIS — I4891 Unspecified atrial fibrillation: Secondary | ICD-10-CM

## 2012-02-19 NOTE — Progress Notes (Signed)
Echocardiogram performed.  

## 2012-03-04 ENCOUNTER — Ambulatory Visit (INDEPENDENT_AMBULATORY_CARE_PROVIDER_SITE_OTHER): Payer: Medicare Other | Admitting: *Deleted

## 2012-03-04 DIAGNOSIS — I4891 Unspecified atrial fibrillation: Secondary | ICD-10-CM

## 2012-03-04 DIAGNOSIS — Z7901 Long term (current) use of anticoagulants: Secondary | ICD-10-CM | POA: Diagnosis not present

## 2012-03-05 ENCOUNTER — Ambulatory Visit (INDEPENDENT_AMBULATORY_CARE_PROVIDER_SITE_OTHER): Payer: Medicare Other | Admitting: Family Medicine

## 2012-03-05 ENCOUNTER — Encounter: Payer: Self-pay | Admitting: Family Medicine

## 2012-03-05 VITALS — BP 110/72 | HR 60 | Temp 97.0°F | Ht 71.0 in | Wt 227.0 lb

## 2012-03-05 DIAGNOSIS — I4891 Unspecified atrial fibrillation: Secondary | ICD-10-CM | POA: Diagnosis not present

## 2012-03-05 DIAGNOSIS — R7401 Elevation of levels of liver transaminase levels: Secondary | ICD-10-CM | POA: Diagnosis not present

## 2012-03-05 DIAGNOSIS — I509 Heart failure, unspecified: Secondary | ICD-10-CM

## 2012-03-05 DIAGNOSIS — Z79899 Other long term (current) drug therapy: Secondary | ICD-10-CM

## 2012-03-05 DIAGNOSIS — G3184 Mild cognitive impairment, so stated: Secondary | ICD-10-CM

## 2012-03-05 LAB — BASIC METABOLIC PANEL
Chloride: 104 mEq/L (ref 96–112)
GFR: 79.33 mL/min (ref >60.00)
Potassium: 3.8 mEq/L (ref 3.5–5.1)
Sodium: 137 mEq/L (ref 135–145)

## 2012-03-05 NOTE — Progress Notes (Signed)
OFFICE NOTE  03/05/2012  CC:  Chief Complaint  Patient presents with  . Follow-up    cognitive impairment, CHF     HPI: Patient is a 72 y.o. Caucasian male who is here for 3 mo f/u mild cognitive impairment and also CHF. Since I last saw him he has had the LV lead for his ICD/Pacer/CRT device replaced.  F/u echo showed no improvement in EF but mitral regurg mildly improved.  Says memory impairment is unchanged.  He has no acute complaints, says he feels well.  He has no significant cough lately.  Says he was told to stop his inhaler by Dr. Gwenette Greet.  Pt says he is able to walk on treadmill or do stationary bike slowly for 5-10 min.  He usually does this twice on work-out days, with some light leg exercises in between.  Recent f/u of his hepatic panel and TSH were normal by Dr. Stanford Breed (02/13/12). Last BMET was about 2 months ago.  Pertinent PMH:  Past Medical History  Diagnosis Date  . Atrial fibrillation     Ablation for a-flutter 05/2000  . Cardiomyopathy     Ischemic.  EF 20%  . Hypertension   . Hyperlipidemia   . Allergy     rhinitis  . Obstructive sleep apnea     CPAP 16  . CAD (coronary artery disease)     PTCA RCA 1997 (Inf/post MI);  [name] Ventricular tachycardia     ICD, pacer  . Adenomatous colon polyp     Colonoscopy 07/2007---Dr. Watt Climes, [facility] (Rpt 5 yrs recommended)  . Kidney stones     No distal stones on CT 2010  . Myocardial infarction   . Microscopic hematuria     Cysto neg 02/2009 except for BPH  . Diverticulosis of colon 2005    Noted on Colonoscopies in 2005 and 2009 and on CT 2010  . Internal hemorrhoids     Colonoscopy 07/2007  . Hepatic steatosis 2010    Noted on CT abd 2010 and on u/s abd 04/2009 (transaminasemia)  . Allergic rhinitis   . BPH (benign prostatic hypertrophy)     PSAs ok (followed by urologist)  . History of impaired glucose tolerance   . Shortness of breath   . CHF (congestive heart failure)   . Pacemaker     pacermaker  /ICD/CRT---LV lead replacement 12/2012.  F/u echo showed mild improvement in mitral regurg but EF still 10%.    MEDS:  Outpatient Prescriptions Prior to Visit  Medication Sig Dispense Refill  . amiodarone (PACERONE) 200 MG tablet Take 1 tablet (200 mg total) by mouth daily.  90 tablet  3  . carvedilol (COREG) 25 MG tablet Take 0.5 tablets (12.5 mg total) by mouth 2 (two) times daily with a meal.  180 tablet  3  . eplerenone (INSPRA) 25 MG tablet Take 25 mg by mouth at bedtime.      . fluticasone (FLONASE) 50 MCG/ACT nasal spray Place 2 sprays into the nose 2 (two) times daily.  16 g  12  . furosemide (LASIX) 40 MG tablet Take 2 tablets (80 mg total) by mouth daily.  180 tablet  3  . hydroxypropyl methylcellulose (ISOPTO TEARS) 2.5 % ophthalmic solution Place 1 drop into both eyes daily as needed. For dry eyes      . ipratropium (ATROVENT) 0.06 % nasal spray Place 2 sprays into the nose at bedtime.  15 mL  6  . levocetirizine (XYZAL) 5 MG tablet Take 1  tablet (5 mg total) by mouth every evening.  30 tablet  6  . mexiletine (MEXITIL) 150 MG capsule Take 2 capsules (300 mg total) by mouth 2 (two) times daily.  120 capsule  12  . Multiple Vitamin (MULTIVITAMIN WITH MINERALS) TABS Take 1 tablet by mouth daily.      . nitroGLYCERIN (NITROSTAT) 0.4 MG SL tablet Place 1 tablet (0.4 mg total) under the tongue every 5 (five) minutes as needed.  25 tablet  2  . pantoprazole (PROTONIX) 40 MG tablet Take 1 tablet (40 mg total) by mouth 2 (two) times daily.  60 tablet  2  . potassium chloride SA (K-DUR,KLOR-CON) 20 MEQ tablet Take 1 tablet (20 mEq total) by mouth daily.  30 tablet  2  . warfarin (COUMADIN) 5 MG tablet Take 2.5-5 mg by mouth daily. [name] and Sat = 2.5 5 mg all other days      . valsartan (DIOVAN) 40 MG tablet Take 1 tablet (40 mg total) by mouth daily.  90 tablet  3   No facility-administered medications prior to visit.    PE: Blood pressure 110/72, pulse 60, temperature 97 F (36.1  C), temperature source Temporal, height 5' 11" (1.803 m), weight 227 lb (102.967 kg). Gen: Alert, well appearing.  Patient is oriented to person, place, time, and situation. AFFECT: pleasant.  Displays lucid thought and speech. CV: RRR, very soft systolic murmur audible at apex LUNGS: CTA bilat, nonlabored EXT: no clubbing, cyanosis, or edema.   IMPRESSION AND PLAN:  Mild cognitive impairment Stable.  No med treatment at this time.  CHF (congestive heart failure) He feels better since the new LV lead for his CRT device was inserted, but f/u echo showed no improvement in EF but mild improvement in MR. He'll continue to do low Na diet, continue current cardiac meds, keep f/u with Dr. Caryl Comes. Will check BMET today.   Pt requests vit D level check.  Has no hx of vit D deficiency.  He signed a waiver to get this lab done.   An After Visit Summary was printed and given to the patient.  FOLLOW UP: 4 mo

## 2012-03-05 NOTE — Assessment & Plan Note (Signed)
Stable.  No med treatment at this time.

## 2012-03-05 NOTE — Assessment & Plan Note (Signed)
He feels better since the new LV lead for his CRT device was inserted, but f/u echo showed no improvement in EF but mild improvement in MR. He'll continue to do low Na diet, continue current cardiac meds, keep f/u with Dr. Caryl Comes. Will check BMET today.   Pt requests vit D level check.  Has no hx of vit D deficiency.  He signed a waiver to get this lab done.

## 2012-03-06 LAB — VITAMIN D 25 HYDROXY (VIT D DEFICIENCY, FRACTURES): Vit D, 25-Hydroxy: 25 ng/mL — ABNORMAL LOW (ref 30–89)

## 2012-03-17 ENCOUNTER — Other Ambulatory Visit: Payer: Self-pay | Admitting: Cardiology

## 2012-03-31 ENCOUNTER — Other Ambulatory Visit: Payer: Self-pay | Admitting: Cardiology

## 2012-04-01 ENCOUNTER — Ambulatory Visit (INDEPENDENT_AMBULATORY_CARE_PROVIDER_SITE_OTHER): Payer: Medicare Other | Admitting: *Deleted

## 2012-04-01 DIAGNOSIS — I4891 Unspecified atrial fibrillation: Secondary | ICD-10-CM | POA: Diagnosis not present

## 2012-04-01 DIAGNOSIS — Z7901 Long term (current) use of anticoagulants: Secondary | ICD-10-CM

## 2012-04-01 LAB — POCT INR: INR: 2.7

## 2012-04-07 ENCOUNTER — Ambulatory Visit (INDEPENDENT_AMBULATORY_CARE_PROVIDER_SITE_OTHER): Payer: Medicare Other | Admitting: Internal Medicine

## 2012-04-07 ENCOUNTER — Encounter: Payer: Self-pay | Admitting: Internal Medicine

## 2012-04-07 VITALS — BP 106/78 | HR 80 | Ht 72.0 in | Wt 230.8 lb

## 2012-04-07 DIAGNOSIS — I429 Cardiomyopathy, unspecified: Secondary | ICD-10-CM

## 2012-04-07 DIAGNOSIS — I428 Other cardiomyopathies: Secondary | ICD-10-CM | POA: Diagnosis not present

## 2012-04-07 DIAGNOSIS — I059 Rheumatic mitral valve disease, unspecified: Secondary | ICD-10-CM

## 2012-04-07 DIAGNOSIS — I4891 Unspecified atrial fibrillation: Secondary | ICD-10-CM | POA: Diagnosis not present

## 2012-04-07 LAB — ICD DEVICE OBSERVATION
AL IMPEDENCE ICD: 565 Ohm
AL THRESHOLD: 0.7 V
LV LEAD AMPLITUDE: 6.1 mv
LV LEAD IMPEDENCE ICD: 978 Ohm
RV LEAD IMPEDENCE ICD: 722 Ohm
RV LEAD THRESHOLD: 1.2 V
TZON-0002FASTVT: 4
TZON-0003FASTVT: 333.3 ms

## 2012-04-07 MED ORDER — POTASSIUM CHLORIDE CRYS ER 20 MEQ PO TBCR: 20.0000 meq | EXTENDED_RELEASE_TABLET | Freq: Every day | ORAL | Status: DC

## 2012-04-07 MED ORDER — PANTOPRAZOLE SODIUM 40 MG PO TBEC: 40.0000 mg | DELAYED_RELEASE_TABLET | Freq: Two times a day (BID) | ORAL | Status: DC

## 2012-04-07 MED ORDER — CARVEDILOL 25 MG PO TABS: 12.5000 mg | ORAL_TABLET | Freq: Two times a day (BID) | ORAL | Status: DC

## 2012-04-07 MED ORDER — VALSARTAN 40 MG PO TABS: 20.0000 mg | ORAL_TABLET | Freq: Every day | ORAL | Status: DC

## 2012-04-07 MED ORDER — AMIODARONE HCL 200 MG PO TABS: 200.0000 mg | ORAL_TABLET | Freq: Every day | ORAL | Status: DC

## 2012-04-07 MED ORDER — FUROSEMIDE 40 MG PO TABS: 80.0000 mg | ORAL_TABLET | Freq: Every day | ORAL | Status: DC

## 2012-04-07 MED ORDER — NITROGLYCERIN 0.4 MG SL SUBL: 0.4000 mg | SUBLINGUAL_TABLET | SUBLINGUAL | Status: DC | PRN

## 2012-04-07 MED ORDER — MEXILETINE HCL 150 MG PO CAPS: 300.0000 mg | ORAL_CAPSULE | Freq: Two times a day (BID) | ORAL | Status: DC

## 2012-04-07 MED ORDER — EPLERENONE 25 MG PO TABS: 25.0000 mg | ORAL_TABLET | Freq: Every day | ORAL | Status: DC

## 2012-04-07 NOTE — Progress Notes (Signed)
Patient Care Team: Joel Sou, MD as PCP - General (Family Medicine) Joel Bonine, MD as Consulting Physician (Urology) Joel Delton, MD as Consulting Physician (Pulmonary Disease)   HPI  [name] is a 72 y.o. male Seen in followup for congestive heart failure in the setting of ischemic heart disease and previously implanted CRT-D with subsequent left ventricular lead macro dislodgment. His symptoms however been quite stable. When he saw [name] concerning lead extraction and lead implantation he was doing as well as he had for some time and it was decided not to pursue it then.  He also has ventricular tachycardia for which he takes amiodarone. LFTs were abnormal and his Crestor was stopped. Repeat LFTs remained elevated when they were last checked in August.  Last cardiac catheterization in October 2008 showed an occluded right coronary artery with left-to-right collateralization. Ejection fraction is 20%. There was no other coronary disease noted. Last Myoview in June of 2010 showed prior inferior infarct but no ischemia. His ejection fraction was 21%previous carotid Dopplers in April 2006 showed 0-39% stenosis bilaterally. Previous abdominal ultrasound in April 2006 showed no aneurysm.    Because of symptoms of congestive heart failure he underwent CRT upgrade by [name] with subsequent extraction of the previously implanted failed LV lead.  He is feeling better  There has been interval microdislodgement manifested by increasing thresholds. Interval echo has shown improvement in mitral regurgitation   Past Medical History  Diagnosis Date  . Atrial fibrillation     Ablation for a-flutter 05/2000  . Cardiomyopathy     Ischemic.  EF 20%  . Hypertension   . Hyperlipidemia   . Allergy     rhinitis  . Obstructive sleep apnea     CPAP 16  . CAD (coronary artery disease)     PTCA RCA 1997 (Inf/post MI);  [name] Ventricular tachycardia     ICD, pacer  . Adenomatous  colon polyp     Colonoscopy 07/2007---[name], [facility] (Rpt 5 yrs recommended)  . Kidney stones     No distal stones on CT 2010  . Myocardial infarction   . Microscopic hematuria     Cysto neg 02/2009 except for BPH  . Diverticulosis of colon 2005    Noted on Colonoscopies in 2005 and 2009 and on CT 2010  . Internal hemorrhoids     Colonoscopy 07/2007  . Hepatic steatosis 2010    Noted on CT abd 2010 and on u/s abd 04/2009 (transaminasemia)  . Allergic rhinitis   . BPH (benign prostatic hypertrophy)     PSAs ok (followed by urologist)  . History of impaired glucose tolerance   . Shortness of breath   . CHF (congestive heart failure)   . Pacemaker     pacermaker /ICD/CRT---LV lead replacement 12/2012.  F/u echo showed mild improvement in mitral regurg but EF still 10%.    Past Surgical History  Procedure Laterality Date  . Icd      a) Guidant Contak H170- Joel Axe, MD 07/25/06 (second device) b) Medtronic CRT-D  . Breast surgery  1990    "Tissue removed from breast"  . Umbilical hernia repair    . Hernia repair  1975    bilateral inguinal  . Extracorporeal shock wave lithotripsy    . Atrial ablation surgery    . Cardiac catheterization      Current Outpatient Prescriptions  Medication Sig Dispense Refill  . amiodarone (PACERONE) 200 MG tablet Take 1  tablet (200 mg total) by mouth daily.  90 tablet  3  . carvedilol (COREG) 25 MG tablet Take 0.5 tablets (12.5 mg total) by mouth 2 (two) times daily with a meal.  180 tablet  3  . Cholecalciferol (VITAMIN D PO) Take 3,000 mcg by mouth daily.      [name] eplerenone (INSPRA) 25 MG tablet TAKE 1 TABLET BY MOUTH EVERY DAY  90 tablet  0  . fluticasone (FLONASE) 50 MCG/ACT nasal spray Place 2 sprays into the nose 2 (two) times daily.  16 g  12  . furosemide (LASIX) 40 MG tablet Take 2 tablets (80 mg total) by mouth daily.  180 tablet  3  . hydroxypropyl methylcellulose (ISOPTO TEARS) 2.5 % ophthalmic solution Place 1 drop into both eyes  daily as needed. For dry eyes      . ipratropium (ATROVENT) 0.06 % nasal spray Place 2 sprays into the nose at bedtime.  15 mL  6  . levocetirizine (XYZAL) 5 MG tablet Take 1 tablet (5 mg total) by mouth every evening.  30 tablet  6  . mexiletine (MEXITIL) 150 MG capsule Take 2 capsules (300 mg total) by mouth 2 (two) times daily.  120 capsule  12  . Multiple Vitamin (MULTIVITAMIN WITH MINERALS) TABS Take 1 tablet by mouth daily.      . nitroGLYCERIN (NITROSTAT) 0.4 MG SL tablet Place 1 tablet (0.4 mg total) under the tongue every 5 (five) minutes as needed.  25 tablet  2  . pantoprazole (PROTONIX) 40 MG tablet Take 1 tablet (40 mg total) by mouth 2 (two) times daily.  60 tablet  2  . potassium chloride SA (K-DUR,KLOR-CON) 20 MEQ tablet Take 1 tablet (20 mEq total) by mouth daily.  30 tablet  2  . valsartan (DIOVAN) 40 MG tablet Take 20 mg by mouth daily.      [name] warfarin (COUMADIN) 5 MG tablet Take 2.5-5 mg by mouth daily. [name] and Sat = 2.5 5 mg all other days      . warfarin (COUMADIN) 5 MG tablet TAKE AS DIRECTED BY ANTICOAGULATION CLINIC  90 tablet  1  . warfarin (COUMADIN) 5 MG tablet TAKE AS DIRECTED BY THE ANTICOAGULATION CLINIC.  30 tablet  3   No current facility-administered medications for this visit.    Allergies  Allergen Reactions  . Sulfa Antibiotics Rash    Review of Systems negative except from HPI and PMH  Physical Exam BP 106/78  Pulse 80  Ht 6' (1.829 m)  Wt 230 lb 12.8 oz (104.69 kg)  BMI 31.3 kg/m2  SpO2 95% Well developed and well nourished in no acute distress HENT normal E scleral and icterus clear Neck Supple JVP flat; carotids brisk and full Clear to ausculation Device pocket well healed; without hematoma or erythema Regular rate and rhythm, n2/6 murmurs gallops or rub Soft with active bowel sounds No clubbing cyanosis noneas such Edema Alert and oriented, grossly normal motor and sensory function Skin Warm and Dry    Assessment and   Plan

## 2012-04-07 NOTE — Assessment & Plan Note (Addendum)
No interval VT. Continue amiodarone and mexiletine; surveillance laboratories were normal in January 2014

## 2012-04-07 NOTE — Assessment & Plan Note (Signed)
Holding sinus rhythm.

## 2012-04-07 NOTE — Patient Instructions (Signed)
Remote monitoring is used to monitor your Pacemaker of ICD from home. This monitoring reduces the number of office visits required to check your device to one time per year. It allows [place] to keep an eye on the functioning of your device to ensure it is working properly. You are scheduled for a device check from home on 07/16/12. You may send your transmission at any time that day. If you have a wireless device, the transmission will be sent automatically. After your physician reviews your transmission, you will receive a postcard with your next transmission date.  Your physician wants you to follow-up in: December with Dr. Caryl Comes. You will receive a reminder letter in the mail two months in advance. If you don't receive a letter, please call our office to schedule the follow-up appointment.  Your physician recommends that you continue on your current medications as directed. Please refer to the Current Medication list given to you today.

## 2012-04-07 NOTE — Assessment & Plan Note (Signed)
Euvolemic. Improved symptoms. Less mitral regurgitation by echo. He has persistent problems with chronotropic incompetence and his rate response was made more aggressive.

## 2012-04-07 NOTE — Assessment & Plan Note (Signed)
As above  e

## 2012-04-07 NOTE — Assessment & Plan Note (Signed)
The patient's device was interrogated and the information was fully reviewed.  The device was reprogrammed to maximize longevity

## 2012-04-23 ENCOUNTER — Ambulatory Visit (INDEPENDENT_AMBULATORY_CARE_PROVIDER_SITE_OTHER): Payer: Medicare Other

## 2012-04-23 DIAGNOSIS — Z7901 Long term (current) use of anticoagulants: Secondary | ICD-10-CM | POA: Diagnosis not present

## 2012-04-23 DIAGNOSIS — I4891 Unspecified atrial fibrillation: Secondary | ICD-10-CM | POA: Diagnosis not present

## 2012-05-11 DIAGNOSIS — M5430 Sciatica, unspecified side: Secondary | ICD-10-CM | POA: Diagnosis not present

## 2012-05-11 DIAGNOSIS — M999 Biomechanical lesion, unspecified: Secondary | ICD-10-CM | POA: Diagnosis not present

## 2012-05-12 DIAGNOSIS — M999 Biomechanical lesion, unspecified: Secondary | ICD-10-CM | POA: Diagnosis not present

## 2012-05-12 DIAGNOSIS — M5430 Sciatica, unspecified side: Secondary | ICD-10-CM | POA: Diagnosis not present

## 2012-05-13 DIAGNOSIS — M5430 Sciatica, unspecified side: Secondary | ICD-10-CM | POA: Diagnosis not present

## 2012-05-13 DIAGNOSIS — M999 Biomechanical lesion, unspecified: Secondary | ICD-10-CM | POA: Diagnosis not present

## 2012-05-14 DIAGNOSIS — M5430 Sciatica, unspecified side: Secondary | ICD-10-CM | POA: Diagnosis not present

## 2012-05-14 DIAGNOSIS — M999 Biomechanical lesion, unspecified: Secondary | ICD-10-CM | POA: Diagnosis not present

## 2012-05-21 ENCOUNTER — Ambulatory Visit: Payer: Medicare Other | Admitting: Pulmonary Disease

## 2012-05-25 ENCOUNTER — Ambulatory Visit (INDEPENDENT_AMBULATORY_CARE_PROVIDER_SITE_OTHER): Payer: Medicare Other | Admitting: Pharmacist

## 2012-05-25 DIAGNOSIS — Z7901 Long term (current) use of anticoagulants: Secondary | ICD-10-CM | POA: Diagnosis not present

## 2012-05-25 DIAGNOSIS — I4891 Unspecified atrial fibrillation: Secondary | ICD-10-CM

## 2012-06-15 ENCOUNTER — Other Ambulatory Visit: Payer: Self-pay | Admitting: Cardiology

## 2012-07-01 ENCOUNTER — Encounter: Payer: Self-pay | Admitting: Family Medicine

## 2012-07-01 ENCOUNTER — Ambulatory Visit (INDEPENDENT_AMBULATORY_CARE_PROVIDER_SITE_OTHER): Payer: Medicare Other | Admitting: Family Medicine

## 2012-07-01 VITALS — BP 124/82 | HR 60 | Temp 97.8°F | Resp 18 | Ht 71.0 in | Wt 228.0 lb

## 2012-07-01 DIAGNOSIS — I5022 Chronic systolic (congestive) heart failure: Secondary | ICD-10-CM | POA: Diagnosis not present

## 2012-07-01 DIAGNOSIS — E559 Vitamin D deficiency, unspecified: Secondary | ICD-10-CM

## 2012-07-01 DIAGNOSIS — G3184 Mild cognitive impairment, so stated: Secondary | ICD-10-CM

## 2012-07-01 DIAGNOSIS — I509 Heart failure, unspecified: Secondary | ICD-10-CM

## 2012-07-01 LAB — BASIC METABOLIC PANEL
Calcium: 8.6 mg/dL (ref 8.4–10.5)
GFR: 73.25 mL/min (ref >60.00)
Glucose, Bld: 100 mg/dL — ABNORMAL HIGH (ref 70–99)
Sodium: 133 mEq/L — ABNORMAL LOW (ref 135–145)

## 2012-07-01 LAB — MAGNESIUM: Magnesium: 2.4 mg/dL (ref 1.5–2.5)

## 2012-07-01 LAB — PHOSPHORUS: Phosphorus: 2.8 mg/dL (ref 2.3–4.6)

## 2012-07-01 NOTE — Progress Notes (Signed)
OFFICE NOTE  07/01/2012  CC:  Chief Complaint  Patient presents with  . Follow-up  . blood work     HPI: Patient is a 72 y.o. Caucasian male who is here for 67mof/u mild cognitive impairment, CHF, GERD, recent low vit D (for which he has been on 5000 IU qd for the last 3-4 months). Wants vit D rechecked. Takes 461mbid lasix usually, but every 2-3 days he takes an extra lasix pill due to either some wt gain or a bit more cough or dyspnea. He feels good, no new complaints.  Says mild short term memory problems are unchanged.  No new cognitive sx's to suggest worsening of general functioning.  Pertinent PMH:  Past Medical History  Diagnosis Date  . Atrial fibrillation     Ablation for a-flutter 05/2000  . Cardiomyopathy     Ischemic.  EF 20%  . Hypertension   . Hyperlipidemia   . Allergic rhinitis   . Obstructive sleep apnea     CPAP 16  . CAD (coronary artery disease)     PTCA RCA 1997 (Inf/post MI);  . Marland Kitchenentricular tachycardia     ICD, pacer  . Adenomatous colon polyp     [facility]  . Kidney stones     No distal stones on CT 2010  . Myocardial infarction   . Microscopic hematuria     Cysto neg 02/2009 except for BPH  . Diverticulosis of colon 2005    Noted on Colonoscopies in 2005 and 2009 and on CT 2010  . Internal hemorrhoids     Colonoscopy 07/2007  . Hepatic steatosis 2010    Noted on CT abd 2010 and on u/s abd 04/2009 (transaminasemia)  . Allergic rhinitis   . BPH (benign prostatic hypertrophy)     PSAs ok (followed by urologist)  . History of impaired glucose tolerance   . Shortness of breath   . CHF (congestive heart failure)   . Pacemaker     pacermaker /ICD/CRT---LV lead replacement 12/2012.  F/u echo showed mild improvement in mitral regurg but EF still 10%.   Past surgical, social, and family history reviewed and no changes noted since last office visit.  MEDS:  Outpatient Prescriptions Prior to  Visit  Medication Sig Dispense Refill  . amiodarone (PACERONE) 200 MG tablet Take 1 tablet (200 mg total) by mouth daily.  90 tablet  3  . carvedilol (COREG) 25 MG tablet Take 0.5 tablets (12.5 mg total) by mouth 2 (two) times daily with a meal.  90 tablet  3  . Cholecalciferol (VITAMIN D PO) Take 3,000 mcg by mouth daily.      . Marland Kitchenplerenone (INSPRA) 25 MG tablet Take 1 tablet (25 mg total) by mouth at bedtime.  90 tablet  3  . eplerenone (INSPRA) 25 MG tablet TAKE 1 TABLET BY MOUTH DAILY  90 tablet  0  . furosemide (LASIX) 40 MG tablet Take 2 tablets (80 mg total) by mouth daily.  180 tablet  3  . hydroxypropyl methylcellulose (ISOPTO TEARS) 2.5 % ophthalmic solution Place 1 drop into both eyes daily as needed. For dry eyes      . ipratropium (ATROVENT) 0.06 % nasal spray Place 2 sprays into the nose at bedtime.  15 mL  6  . levocetirizine (XYZAL) 5 MG tablet Take 1 tablet (5 mg total) by mouth every evening.  30 tablet  6  . mexiletine (MEXITIL) 150 MG capsule Take  2 capsules (300 mg total) by mouth 2 (two) times daily.  360 capsule  3  . Multiple Vitamin (MULTIVITAMIN WITH MINERALS) TABS Take 1 tablet by mouth daily.      . nitroGLYCERIN (NITROSTAT) 0.4 MG SL tablet Place 1 tablet (0.4 mg total) under the tongue every 5 (five) minutes as needed.  25 tablet  3  . pantoprazole (PROTONIX) 40 MG tablet Take 1 tablet (40 mg total) by mouth 2 (two) times daily.  180 tablet  3  . potassium chloride SA (K-DUR,KLOR-CON) 20 MEQ tablet Take 1 tablet (20 mEq total) by mouth daily.  90 tablet  3  . valsartan (DIOVAN) 40 MG tablet Take 0.5 tablets (20 mg total) by mouth daily.  45 tablet  3  . warfarin (COUMADIN) 5 MG tablet Take 2.5-5 mg by mouth daily. [name] and Sat = 2.5 5 mg all other days      . warfarin (COUMADIN) 5 MG tablet TAKE AS DIRECTED BY ANTICOAGULATION CLINIC  90 tablet  1  . warfarin (COUMADIN) 5 MG tablet TAKE AS DIRECTED BY THE ANTICOAGULATION CLINIC.  30 tablet  3  . fluticasone  (FLONASE) 50 MCG/ACT nasal spray Place 2 sprays into the nose 2 (two) times daily.  16 g  12   No facility-administered medications prior to visit.    PE: Blood pressure 124/82, pulse 60, temperature 97.8 F (36.6 C), temperature source Oral, resp. rate 18, height _0  (1.803 m), weight 228 lb (103.42 kg), SpO2 96.00%. Gen: Alert, well appearing.  Patient is oriented to person, place, time, and situation. AFFECT: pleasant, lucid thought and speech. CV: RRR, distant S1 and S2.  No murmur appreciated, no rub or gallop.   LUNGS: CTA bilat, nonlabored. EXT: no clubbing, cyanosis, or edema.    IMPRESSION AND PLAN:  SYSTOLIC HEART FAILURE, CHRONIC Stable. Check BMET + Mg and phos today due to a bit more lasix use lately.  Vitamin D deficiency Recheck 25-OH vit D level today to see if he has adequately replenished his levels.  Mild cognitive impairment Stable.  Continue to monitor for worsening. No new testing today or new meds.   An After Visit Summary was printed and given to the patient.  FOLLOW UP: 84mo

## 2012-07-02 LAB — VITAMIN D 25 HYDROXY (VIT D DEFICIENCY, FRACTURES): Vit D, 25-Hydroxy: 56 ng/mL (ref 30–89)

## 2012-07-03 ENCOUNTER — Encounter: Payer: Self-pay | Admitting: Nurse Practitioner

## 2012-07-03 ENCOUNTER — Ambulatory Visit (INDEPENDENT_AMBULATORY_CARE_PROVIDER_SITE_OTHER): Payer: Medicare Other | Admitting: Nurse Practitioner

## 2012-07-03 VITALS — BP 120/70 | HR 70 | Temp 97.6°F | Resp 12 | Wt 229.8 lb

## 2012-07-03 DIAGNOSIS — T189XXA Foreign body of alimentary tract, part unspecified, initial encounter: Secondary | ICD-10-CM

## 2012-07-03 NOTE — Progress Notes (Signed)
  Subjective:    Patient ID: [name], male    DOB: 01-15-1940, 71 y.o.   MRN: [id number]  GI Problem Primary symptoms do not include fatigue, nausea, vomiting, diarrhea, melena, hematemesis, hematochezia or dysuria. The illness began yesterday (thinks he may have swallowed shards of glass while drinking a ginger ale. Found shards of glass in bottom of container, spit one out of mouth.). Onset quality: No current  symptoms. The problem has not changed (No symptoms. thinks swallowed shards of glass about 10 hours ago) since onset. The illness is also significant for constipation (reports hard stools). The illness does not include chills. Associated symptoms comments: No symptoms . Significant associated medical issues include liver disease (Hx. of elevated tranaminase , while on amiodarone). Associated medical issues comments: B12 anemia.      Review of Systems  Constitutional: Negative for chills, activity change, appetite change and fatigue.  Respiratory: Negative for shortness of breath.   Cardiovascular: Negative for chest pain and leg swelling.  Gastrointestinal: Positive for constipation (reports hard stools). Negative for nausea, vomiting, diarrhea, blood in stool, melena, hematochezia, rectal pain and hematemesis.  Genitourinary: Negative for dysuria.       Objective:   Physical Exam  Vitals reviewed. Constitutional: He is oriented to person, place, and time. He appears well-developed and well-nourished. No distress.  HENT:  Head: Normocephalic and atraumatic.  Eyes: Conjunctivae are normal.  Cardiovascular: Normal rate and normal heart sounds.   No murmur heard. Pulmonary/Chest: Effort normal. No respiratory distress.  Neurological: He is alert and oriented to person, place, and time.  Skin: Skin is warm and dry.  Psychiatric: He has a normal mood and affect. His behavior is normal. Thought content normal.          Assessment & Plan:  1. Swallowed foreign body,  initial encounter Pt fears he may have swallowed shards of glass. Not certain. Discussed potential complications of bowel nicking, & bleeding. Pt is on coumadin. Answered all questions. Pt is to monitor stool for signs of bleeding. Will bring in guaic cards. See pt instructions.

## 2012-07-03 NOTE — Patient Instructions (Addendum)
Eat soft bland diet for few days-mostly fruits and vegetables and whole grains.  No spicy foods or laxatives. Please monitor for black or maroon colored stool. If you see this or have abdominal pain, or become nauseous please go to ER. Over the next 3-4 days, place stool sample on card & return to office.

## 2012-07-06 ENCOUNTER — Other Ambulatory Visit: Payer: Self-pay | Admitting: Emergency Medicine

## 2012-07-06 DIAGNOSIS — T189XXA Foreign body of alimentary tract, part unspecified, initial encounter: Secondary | ICD-10-CM

## 2012-07-07 ENCOUNTER — Other Ambulatory Visit: Payer: Medicare Other

## 2012-07-07 DIAGNOSIS — T189XXA Foreign body of alimentary tract, part unspecified, initial encounter: Secondary | ICD-10-CM

## 2012-07-07 LAB — HEMOCCULT SLIDES (X 3 CARDS): OCCULT 4: NEGATIVE

## 2012-07-08 ENCOUNTER — Ambulatory Visit (INDEPENDENT_AMBULATORY_CARE_PROVIDER_SITE_OTHER): Payer: Medicare Other | Admitting: *Deleted

## 2012-07-08 DIAGNOSIS — I4891 Unspecified atrial fibrillation: Secondary | ICD-10-CM

## 2012-07-08 DIAGNOSIS — Z7901 Long term (current) use of anticoagulants: Secondary | ICD-10-CM

## 2012-07-08 LAB — POCT INR: INR: 3.7

## 2012-07-09 ENCOUNTER — Telehealth: Payer: Self-pay | Admitting: Cardiology

## 2012-07-09 NOTE — Telephone Encounter (Signed)
Ok to dc coumadin 5 days prior to procedure and resume day of procedure. [name]

## 2012-07-09 NOTE — Telephone Encounter (Signed)
Left message for Pamala Hurry, have received the paperwork regarding pt needing colonoscopy and endoscopy on Monday. apparently the has swallowed glass and is having abdominal pain. They need to know about stopping his coumadin 5 days prior to the procedure. Will forward for dr Stanford Breed review

## 2012-07-09 NOTE — Telephone Encounter (Signed)
New Prob     Pt is having a procedure done on Monday 6/30 and would like to know if pt can come off of COUMADIN 5 days prior to procedure. Please call.

## 2012-07-09 NOTE — Telephone Encounter (Signed)
Left message for Pamala Hurry of dr Jacalyn Lefevre recommendations. Faxed to [phone].

## 2012-07-13 ENCOUNTER — Other Ambulatory Visit: Payer: Self-pay | Admitting: Gastroenterology

## 2012-07-13 ENCOUNTER — Encounter (HOSPITAL_COMMUNITY): Payer: Self-pay | Admitting: *Deleted

## 2012-07-13 ENCOUNTER — Encounter (HOSPITAL_COMMUNITY)
Admission: RE | Disposition: A | Discharge status: Home / Self Care | Payer: Self-pay | Source: Ambulatory Visit | Attending: Gastroenterology

## 2012-07-13 ENCOUNTER — Ambulatory Visit (HOSPITAL_COMMUNITY)
Admission: RE | Admit: 2012-07-13 | Discharge: 2012-07-13 | Disposition: A | Discharge status: Home / Self Care | Payer: Medicare Other | Source: Ambulatory Visit | Attending: Gastroenterology | Admitting: Gastroenterology

## 2012-07-13 DIAGNOSIS — I252 Old myocardial infarction: Secondary | ICD-10-CM | POA: Diagnosis not present

## 2012-07-13 DIAGNOSIS — R1013 Epigastric pain: Secondary | ICD-10-CM | POA: Diagnosis not present

## 2012-07-13 DIAGNOSIS — I2510 Atherosclerotic heart disease of native coronary artery without angina pectoris: Secondary | ICD-10-CM | POA: Insufficient documentation

## 2012-07-13 DIAGNOSIS — Z7901 Long term (current) use of anticoagulants: Secondary | ICD-10-CM

## 2012-07-13 DIAGNOSIS — E785 Hyperlipidemia, unspecified: Secondary | ICD-10-CM | POA: Diagnosis not present

## 2012-07-13 DIAGNOSIS — G4733 Obstructive sleep apnea (adult) (pediatric): Secondary | ICD-10-CM | POA: Insufficient documentation

## 2012-07-13 DIAGNOSIS — K644 Residual hemorrhoidal skin tags: Secondary | ICD-10-CM | POA: Insufficient documentation

## 2012-07-13 DIAGNOSIS — Z79899 Other long term (current) drug therapy: Secondary | ICD-10-CM | POA: Diagnosis not present

## 2012-07-13 DIAGNOSIS — D126 Benign neoplasm of colon, unspecified: Secondary | ICD-10-CM | POA: Diagnosis not present

## 2012-07-13 DIAGNOSIS — I10 Essential (primary) hypertension: Secondary | ICD-10-CM | POA: Insufficient documentation

## 2012-07-13 DIAGNOSIS — K5730 Diverticulosis of large intestine without perforation or abscess without bleeding: Secondary | ICD-10-CM | POA: Diagnosis not present

## 2012-07-13 DIAGNOSIS — Z95810 Presence of automatic (implantable) cardiac defibrillator: Secondary | ICD-10-CM | POA: Insufficient documentation

## 2012-07-13 DIAGNOSIS — K648 Other hemorrhoids: Secondary | ICD-10-CM | POA: Diagnosis not present

## 2012-07-13 HISTORY — PX: COLONOSCOPY: SHX5424

## 2012-07-13 HISTORY — PX: ESOPHAGOGASTRODUODENOSCOPY: SHX5428

## 2012-07-13 LAB — PROTIME-INR: Prothrombin Time: 19.7 seconds — ABNORMAL HIGH (ref 11.6–15.2)

## 2012-07-13 LAB — CBC
MCH: 34.2 pg — ABNORMAL HIGH (ref 26.0–34.0)
MCHC: 34.6 g/dL (ref 30.0–36.0)
Platelets: 307 10*3/uL (ref 150–400)
RDW: 13.1 % (ref 11.5–15.5)

## 2012-07-13 SURGERY — EGD (ESOPHAGOGASTRODUODENOSCOPY)
Anesthesia: Moderate Sedation

## 2012-07-13 MED ORDER — MIDAZOLAM HCL 10 MG/2ML IJ SOLN
INTRAMUSCULAR | Status: DC | PRN
  Administered 2012-07-13 (×5): 2 mg via INTRAVENOUS

## 2012-07-13 MED ORDER — FENTANYL CITRATE 0.05 MG/ML IJ SOLN
INTRAMUSCULAR | Status: AC
  Filled 2012-07-13: qty 2

## 2012-07-13 MED ORDER — MIDAZOLAM HCL 10 MG/2ML IJ SOLN
INTRAMUSCULAR | Status: AC
  Filled 2012-07-13: qty 2

## 2012-07-13 MED ORDER — SODIUM CHLORIDE 0.9 % IV SOLN: INTRAVENOUS | Status: DC

## 2012-07-13 MED ORDER — BUTAMBEN-TETRACAINE-BENZOCAINE 2-2-14 % EX AERO
INHALATION_SPRAY | CUTANEOUS | Status: DC | PRN
  Administered 2012-07-13: 2 via TOPICAL

## 2012-07-13 MED ORDER — FENTANYL CITRATE 0.05 MG/ML IJ SOLN
INTRAMUSCULAR | Status: DC | PRN
  Administered 2012-07-13 (×4): 25 ug via INTRAVENOUS

## 2012-07-13 NOTE — Op Note (Signed)
[facility] [place], [id number]   ENDOSCOPY PROCEDURE REPORT  PATIENT: Joel, [name]  MR#: [id number] BIRTHDATE: 1940/06/12 , 49  yrs. old GENDER: Male  ENDOSCOPIST: Clarene Essex, MD REFERRED BY:  PROCEDURE DATE:  07/13/2012 PROCEDURE:   EGD, diagnostic ASA CLASS:   Class II INDICATIONS:Epigastric pain.  concerns over glass ingestion MEDICATIONS: Fentanyl 25 mcg IV and Versed 4 mg IV  TOPICAL ANESTHETIC:used  DESCRIPTION OF PROCEDURE:   After the risks benefits and alternatives of the procedure were thoroughly explained, informed consent was obtained.  The Pentax Gastroscope [id number]  endoscope was introduced through the mouth and advanced to the second portion of the duodenum , limited by Without limitations.   The instrument was slowly withdrawn as the mucosa was fully examined.the findings are recorded below but no significant findings were seen and certainly no glass was seen         FINDINGS: essentially normal EGD  COMPLICATIONS:none  ENDOSCOPIC IMPRESSION: above   RECOMMENDATIONS:questionably see a surgeon regarding umbilical hernia call me when necessary and reassurance regarding glass ingestion was extensively discussed   REPEAT EXAM: as needed   _______________________________ Clarene Essex, MD eSigned:  Clarene Essex, MD 07/13/2012 3:10 PM    CC:  PATIENT NAME:  Joel, [name] MR#: [id number]

## 2012-07-13 NOTE — Op Note (Signed)
[facility] [place], [address]   COLONOSCOPY PROCEDURE REPORT  PATIENT: [name], [name]  MR#: [id number] BIRTHDATE: 1940-12-17 , 63  yrs. old GENDER: Male ENDOSCOPIST: Clarene Essex, MD REFERRED BY: PROCEDURE DATE:  07/13/2012 PROCEDURE:   Colonoscopy with biopsy ASA CLASS:   Class II INDICATIONS:Patient's personal history of adenomatous colon polyps. concerns over swallowing glassMEDICATIONS: Fentanyl 75 mcg IV and Versed 6 mg IV  DESCRIPTION OF PROCEDURE:   After the risks benefits and alternatives of the procedure were thoroughly explained, informed consent was obtained.  The Pentax Adult Colonscope [id number] endoscope was introduced through the anus and advanced to the terminal ileum which was intubated for a short distance , limited by No adverse events experienced.   The quality of the prep was adequate. .  The instrument was then slowly withdrawn as the colon was fully examined.the findings are recorded below and the patient tolerated the procedure well there was no obvious immediate complication and no glass was seen throughout the procedure       FINDINGS: 1. Internal/external hemorrhoids small 2.few sigmoid 1ascending colon diverticula 3.tiny MID transverse polyp cold biopsied 4. Otherwise within normal limits to the terminal ileum without anyglassbeing seen  COMPLICATIONS: none  IMPRESSION:  above  RECOMMENDATIONS: await pathology followup when necessary repeat colonoscopy probably in 5 years reassurance was given endoscopy just to be sure   _______________________________ eSigned:  Clarene Essex, MD 07/13/2012 2:51 PM   CC:

## 2012-07-14 ENCOUNTER — Encounter (HOSPITAL_COMMUNITY): Payer: Self-pay | Admitting: Gastroenterology

## 2012-07-14 DIAGNOSIS — L570 Actinic keratosis: Secondary | ICD-10-CM | POA: Diagnosis not present

## 2012-07-14 DIAGNOSIS — L821 Other seborrheic keratosis: Secondary | ICD-10-CM | POA: Diagnosis not present

## 2012-07-14 DIAGNOSIS — L723 Sebaceous cyst: Secondary | ICD-10-CM | POA: Diagnosis not present

## 2012-07-16 ENCOUNTER — Ambulatory Visit (INDEPENDENT_AMBULATORY_CARE_PROVIDER_SITE_OTHER): Payer: Medicare Other | Admitting: *Deleted

## 2012-07-16 DIAGNOSIS — I4729 Other ventricular tachycardia: Secondary | ICD-10-CM

## 2012-07-16 DIAGNOSIS — I472 Ventricular tachycardia: Secondary | ICD-10-CM

## 2012-07-16 DIAGNOSIS — Z95810 Presence of automatic (implantable) cardiac defibrillator: Secondary | ICD-10-CM

## 2012-07-22 ENCOUNTER — Ambulatory Visit (INDEPENDENT_AMBULATORY_CARE_PROVIDER_SITE_OTHER): Payer: Medicare Other | Admitting: *Deleted

## 2012-07-22 DIAGNOSIS — I4891 Unspecified atrial fibrillation: Secondary | ICD-10-CM | POA: Diagnosis not present

## 2012-07-22 DIAGNOSIS — Z7901 Long term (current) use of anticoagulants: Secondary | ICD-10-CM | POA: Diagnosis not present

## 2012-07-22 LAB — POCT INR: INR: 3.8

## 2012-07-23 ENCOUNTER — Encounter: Payer: Self-pay | Admitting: Internal Medicine

## 2012-07-25 LAB — REMOTE ICD DEVICE
ATRIAL PACING ICD: 99 pct
CHARGE TIME: 9 s
FVT: 0
LV LEAD IMPEDENCE ICD: 1094 Ohm
TOT-0006: 20140421000000
TZAT-0001FASTVT: 1
TZAT-0001SLOWVT: 2
TZAT-0013FASTVT: 2
TZAT-0018FASTVT: NEGATIVE
TZAT-0018FASTVT: NEGATIVE
TZAT-0018SLOWVT: NEGATIVE
TZST-0001FASTVT: 4
TZST-0001FASTVT: 5
TZST-0001FASTVT: 8
TZST-0001SLOWVT: 7
TZST-0002SLOWVT: NEGATIVE
TZST-0002SLOWVT: NEGATIVE
TZST-0003FASTVT: 41 J
TZST-0003FASTVT: 41 J

## 2012-07-28 ENCOUNTER — Encounter: Payer: Self-pay | Admitting: Family Medicine

## 2012-07-28 DIAGNOSIS — I509 Heart failure, unspecified: Secondary | ICD-10-CM | POA: Insufficient documentation

## 2012-07-28 DIAGNOSIS — E559 Vitamin D deficiency, unspecified: Secondary | ICD-10-CM | POA: Insufficient documentation

## 2012-07-28 NOTE — Assessment & Plan Note (Signed)
Stable.  Continue to monitor for worsening. No new testing today or new meds.

## 2012-07-28 NOTE — Assessment & Plan Note (Signed)
Stable. Check BMET + Mg and phos today due to a bit more lasix use lately.

## 2012-07-28 NOTE — Assessment & Plan Note (Signed)
Recheck 25-OH vit D level today to see if he has adequately replenished his levels.

## 2012-08-05 ENCOUNTER — Ambulatory Visit (INDEPENDENT_AMBULATORY_CARE_PROVIDER_SITE_OTHER): Payer: Medicare Other | Admitting: *Deleted

## 2012-08-05 DIAGNOSIS — Z7901 Long term (current) use of anticoagulants: Secondary | ICD-10-CM | POA: Diagnosis not present

## 2012-08-05 DIAGNOSIS — I4891 Unspecified atrial fibrillation: Secondary | ICD-10-CM

## 2012-08-05 LAB — POCT INR: INR: 2.1

## 2012-08-06 ENCOUNTER — Ambulatory Visit (INDEPENDENT_AMBULATORY_CARE_PROVIDER_SITE_OTHER): Payer: Medicare Other | Admitting: Family Medicine

## 2012-08-06 ENCOUNTER — Encounter: Payer: Self-pay | Admitting: Family Medicine

## 2012-08-06 VITALS — BP 126/83 | HR 66 | Temp 96.3°F | Resp 18 | Ht 71.0 in | Wt 223.0 lb

## 2012-08-06 DIAGNOSIS — B9789 Other viral agents as the cause of diseases classified elsewhere: Secondary | ICD-10-CM

## 2012-08-06 DIAGNOSIS — B349 Viral infection, unspecified: Secondary | ICD-10-CM | POA: Insufficient documentation

## 2012-08-06 NOTE — Progress Notes (Signed)
OFFICE NOTE  08/06/2012  CC:  Chief Complaint  Patient presents with  . Illness     HPI: Patient is a 72 y.o. Caucasian male who is here for about 1 wk history of some leg achiness, headache, constipation, some subjective f/c but temp did not register any fever.  No nasal sx's, no runny nose, no cough.   Fingers and toes feel cooler.  Started feeling better yesterday--all achiness gone just tired more than usual at the end of the day.   Feels great today.  ROS: no sob, no rash, no abd pain, no n/v/d.  Pertinent PMH:  Past Medical History  Diagnosis Date  . Atrial fibrillation     Ablation for a-flutter 05/2000  . Cardiomyopathy     Ischemic.  EF 20%  . Hypertension   . Hyperlipidemia   . Allergic rhinitis   . Obstructive sleep apnea     CPAP 16  . CAD (coronary artery disease)     PTCA RCA 1997 (Inf/post MI);  [name] Ventricular tachycardia     ICD, pacer  . Adenomatous colon polyp 2009; 06/2012    Colonoscopy 07/2007---Dr. Barron Schmid GI--Repeat 06/2012 showed one tubular adenoma w/out high grade dysplasia.  . Kidney stones     No distal stones on CT 2010  . Myocardial infarction   . Microscopic hematuria     Cysto neg 02/2009 except for BPH  . Diverticulosis of colon 2005    Noted on Colonoscopies in 2005 and 2009 and on CT 2010  . Internal hemorrhoids     Colonoscopy 07/2007  . Hepatic steatosis 2010    Noted on CT abd 2010 and on u/s abd 04/2009 (transaminasemia)  . Allergic rhinitis   . BPH (benign prostatic hypertrophy)     PSAs ok (followed by urologist)  . History of impaired glucose tolerance   . Shortness of breath   . CHF (congestive heart failure)   . Pacemaker     pacermaker /ICD/CRT---LV lead replacement 12/2012.  F/u echo showed mild improvement in mitral regurg but EF still 10%.    MEDS:  Outpatient Prescriptions Prior to Visit  Medication Sig Dispense Refill  . amiodarone (PACERONE) 200 MG tablet Take 1 tablet (200 mg total) by mouth daily.  90 tablet   3  . carvedilol (COREG) 25 MG tablet Take 0.5 tablets (12.5 mg total) by mouth 2 (two) times daily with a meal.  90 tablet  3  . Cholecalciferol (VITAMIN D PO) Take 3,000 mcg by mouth daily.      [name] eplerenone (INSPRA) 25 MG tablet Take 1 tablet (25 mg total) by mouth at bedtime.  90 tablet  3  . eplerenone (INSPRA) 25 MG tablet TAKE 1 TABLET BY MOUTH DAILY  90 tablet  0  . furosemide (LASIX) 40 MG tablet Take 2 tablets (80 mg total) by mouth daily.  180 tablet  3  . hydroxypropyl methylcellulose (ISOPTO TEARS) 2.5 % ophthalmic solution Place 1 drop into both eyes daily as needed. For dry eyes      . ipratropium (ATROVENT) 0.06 % nasal spray Place 2 sprays into the nose at bedtime.  15 mL  6  . levocetirizine (XYZAL) 5 MG tablet Take 1 tablet (5 mg total) by mouth every evening.  30 tablet  6  . mexiletine (MEXITIL) 150 MG capsule Take 2 capsules (300 mg total) by mouth 2 (two) times daily.  360 capsule  3  . Multiple Vitamin (MULTIVITAMIN WITH MINERALS) TABS Take 1  tablet by mouth daily.      . nitroGLYCERIN (NITROSTAT) 0.4 MG SL tablet Place 1 tablet (0.4 mg total) under the tongue every 5 (five) minutes as needed.  25 tablet  3  . pantoprazole (PROTONIX) 40 MG tablet Take 1 tablet (40 mg total) by mouth 2 (two) times daily.  180 tablet  3  . potassium chloride SA (K-DUR,KLOR-CON) 20 MEQ tablet Take 1 tablet (20 mEq total) by mouth daily.  90 tablet  3  . valsartan (DIOVAN) 40 MG tablet Take 0.5 tablets (20 mg total) by mouth daily.  45 tablet  3  . VENTOLIN HFA 108 (90 BASE) MCG/ACT inhaler       . warfarin (COUMADIN) 5 MG tablet TAKE AS DIRECTED BY THE ANTICOAGULATION CLINIC.  30 tablet  3  . fluticasone (FLONASE) 50 MCG/ACT nasal spray Place 2 sprays into the nose 2 (two) times daily.  16 g  12  . warfarin (COUMADIN) 5 MG tablet Take 2.5-5 mg by mouth daily. [name] and Sat = 2.5 5 mg all other days      . warfarin (COUMADIN) 5 MG tablet TAKE AS DIRECTED BY ANTICOAGULATION CLINIC  90 tablet   1   No facility-administered medications prior to visit.    PE: Blood pressure 126/83, pulse 66, temperature 96.3 F (35.7 C), temperature source Temporal, resp. rate 18, height _0  (1.803 m), weight 223 lb (101.152 kg), SpO2 98.00%. Gen: Alert, well appearing.  Patient is oriented to person, place, time, and situation. ENT:   Eyes: no injection, icteris, swelling, or exudate.  EOMI, PERRLA. Nose: no drainage or turbinate edema/swelling.  No injection or focal lesion.  Mouth: lips without lesion/swelling.  Oral mucosa pink and moist.  Dentition intact and without obvious caries or gingival swelling.  Oropharynx without erythema, exudate, or swelling.  Neck - No masses or thyromegaly or limitation in range of motion CV: RRR, no m/r/g.   LUNGS: CTA bilat, nonlabored resps, good aeration in all lung fields. No palpable tenderness in any muscle groups. SKIN: no rash other than some ecchymoses on both UE extensor surfaces.   IMPRESSION AND PLAN:  Viral syndrome, resolved. Call or return if relapse of symptoms occurs.  FOLLOW UP: prn

## 2012-08-08 ENCOUNTER — Other Ambulatory Visit: Payer: Self-pay | Admitting: Cardiology

## 2012-08-12 DIAGNOSIS — H2510 Age-related nuclear cataract, unspecified eye: Secondary | ICD-10-CM | POA: Diagnosis not present

## 2012-08-12 DIAGNOSIS — H179 Unspecified corneal scar and opacity: Secondary | ICD-10-CM | POA: Diagnosis not present

## 2012-08-12 DIAGNOSIS — H524 Presbyopia: Secondary | ICD-10-CM | POA: Diagnosis not present

## 2012-08-19 ENCOUNTER — Ambulatory Visit (INDEPENDENT_AMBULATORY_CARE_PROVIDER_SITE_OTHER): Payer: Medicare Other | Admitting: Pharmacist

## 2012-08-19 ENCOUNTER — Other Ambulatory Visit: Payer: Self-pay | Admitting: Pharmacist

## 2012-08-19 ENCOUNTER — Other Ambulatory Visit: Payer: Self-pay | Admitting: *Deleted

## 2012-08-19 DIAGNOSIS — I4891 Unspecified atrial fibrillation: Secondary | ICD-10-CM

## 2012-08-19 DIAGNOSIS — Z7901 Long term (current) use of anticoagulants: Secondary | ICD-10-CM

## 2012-08-19 DIAGNOSIS — I429 Cardiomyopathy, unspecified: Secondary | ICD-10-CM

## 2012-08-19 DIAGNOSIS — I059 Rheumatic mitral valve disease, unspecified: Secondary | ICD-10-CM

## 2012-08-19 LAB — POCT INR: INR: 2.1

## 2012-08-19 MED ORDER — VALSARTAN 40 MG PO TABS: 40.0000 mg | ORAL_TABLET | Freq: Every day | ORAL | Status: DC

## 2012-08-20 ENCOUNTER — Other Ambulatory Visit: Payer: Self-pay | Admitting: *Deleted

## 2012-08-20 NOTE — Telephone Encounter (Signed)
Opened in error.

## 2012-08-22 ENCOUNTER — Ambulatory Visit (INDEPENDENT_AMBULATORY_CARE_PROVIDER_SITE_OTHER): Payer: Medicare Other | Admitting: Family Medicine

## 2012-08-22 ENCOUNTER — Encounter: Payer: Self-pay | Admitting: Family Medicine

## 2012-08-22 VITALS — BP 148/90 | HR 69 | Temp 98.1°F | Ht 71.0 in | Wt 226.1 lb

## 2012-08-22 DIAGNOSIS — L03019 Cellulitis of unspecified finger: Secondary | ICD-10-CM | POA: Diagnosis not present

## 2012-08-22 DIAGNOSIS — L03011 Cellulitis of right finger: Secondary | ICD-10-CM | POA: Insufficient documentation

## 2012-08-22 DIAGNOSIS — I10 Essential (primary) hypertension: Secondary | ICD-10-CM | POA: Diagnosis not present

## 2012-08-22 MED ORDER — CEFTRIAXONE SODIUM 500 MG IJ SOLR
250.0000 mg | Freq: Once | INTRAMUSCULAR | Status: AC
  Administered 2012-08-22: 250 mg via INTRAMUSCULAR

## 2012-08-22 MED ORDER — CEFTRIAXONE SODIUM 250 MG IJ SOLR: 250.0000 mg | Freq: Once | INTRAMUSCULAR | Status: DC

## 2012-08-22 MED ORDER — CEPHALEXIN 500 MG PO CAPS: 500.0000 mg | ORAL_CAPSULE | Freq: Four times a day (QID) | ORAL | Status: DC

## 2012-08-22 MED ORDER — TRAMADOL HCL 50 MG PO TABS: 50.0000 mg | ORAL_TABLET | Freq: Three times a day (TID) | ORAL | Status: DC | PRN

## 2012-08-22 NOTE — Assessment & Plan Note (Signed)
Mild elevation with acute illness, no changes today

## 2012-08-22 NOTE — Patient Instructions (Addendum)
See Dr Anitra Lauth early in week for follow up Probiotic daily, Digestive Advantage capsule daily  Paronychia Paronychia is an inflammatory reaction involving the folds of the skin surrounding the fingernail. This is commonly caused by an infection in the skin around a nail. The most common cause of paronychia is frequent wetting of the hands (as seen with bartenders, food servers, nurses or others who wet their hands). This makes the skin around the fingernail susceptible to infection by bacteria (germs) or fungus. Other predisposing factors are:  Aggressive manicuring.  Nail biting.  Thumb sucking. The most common cause is a staphylococcal (a type of germ) infection, or a fungal (Candida) infection. When caused by a germ, it usually comes on suddenly with redness, swelling, pus and is often painful. It may get under the nail and form an abscess (collection of pus), or form an abscess around the nail. If the nail itself is infected with a fungus, the treatment is usually prolonged and may require oral medicine for up to one year. Your caregiver will determine the length of time treatment is required. The paronychia caused by bacteria (germs) may largely be avoided by not pulling on hangnails or picking at cuticles. When the infection occurs at the tips of the finger it is called felon. When the cause of paronychia is from the herpes simplex virus (HSV) it is called herpetic whitlow. TREATMENT  When an abscess is present treatment is often incision and drainage. This means that the abscess must be cut open so the pus can get out. When this is done, the following home care instructions should be followed. HOME CARE INSTRUCTIONS   It is important to keep the affected fingers very dry. Rubber or plastic gloves over cotton gloves should be used whenever the hand must be placed in water.  Keep wound clean, dry and dressed as suggested by your caregiver between warm soaks or warm compresses.  Soak in warm  water for fifteen to twenty minutes three to four times per day for bacterial infections. Fungal infections are very difficult to treat, so often require treatment for long periods of time.  For bacterial (germ) infections take antibiotics (medicine which kill germs) as directed and finish the prescription, even if the problem appears to be solved before the medicine is gone.  Only take over-the-counter or prescription medicines for pain, discomfort, or fever as directed by your caregiver. SEEK IMMEDIATE MEDICAL CARE IF:  You have redness, swelling, or increasing pain in the wound.  You notice pus coming from the wound.  You have a fever.  You notice a bad smell coming from the wound or dressing. Document Released: 06/26/2000 Document Revised: 03/25/2011 Document Reviewed: 02/26/2008 [facility] Patient Information 2014 [place].

## 2012-08-22 NOTE — Progress Notes (Signed)
Patient ID: [name], male   DOB: 1940/03/14, 72 y.o.   MRN: [id number] [name] [id number] [birth year] 08/22/2012      Progress Note-Follow Up  Subjective  Chief Complaint  Chief Complaint  Patient presents with  . thumb infection    right thumb infected?    HPI  Patient is a 72 year old Caucasian male who is in today complaining of right thumb pain. Earlier in the week he was cutting his fingernails and cut a meal down to low amplitude. He said he cleaned it at that point but then didn't pay much attention. Over the next 2 days he worked in the yard without gloves and over the last few days this time it's become increasingly painful, swollen and infected. She's had some low-grade fevers and chills. Malaise and myalgias are noted. The pain is some Him up most of last night due to this throbbing. No red streaking up the arm, GI complaints or GU complaints otherwise  Past Medical History  Diagnosis Date  . Atrial fibrillation     Ablation for a-flutter 05/2000  . Cardiomyopathy     Ischemic.  EF 20%  . Hypertension   . Hyperlipidemia   . Allergic rhinitis   . Obstructive sleep apnea     CPAP 16  . CAD (coronary artery disease)     PTCA RCA 1997 (Inf/post MI);  [name] Ventricular tachycardia     ICD, pacer  . Adenomatous colon polyp 2009; 06/2012    Colonoscopy 07/2007---Dr. Barron Schmid GI--Repeat 06/2012 showed one tubular adenoma w/out high grade dysplasia.  . Kidney stones     No distal stones on CT 2010  . Myocardial infarction   . Microscopic hematuria     Cysto neg 02/2009 except for BPH  . Diverticulosis of colon 2005    Noted on Colonoscopies in 2005 and 2009 and on CT 2010  . Internal hemorrhoids     Colonoscopy 07/2007  . Hepatic steatosis 2010    Noted on CT abd 2010 and on u/s abd 04/2009 (transaminasemia)  . Allergic rhinitis   . BPH (benign prostatic hypertrophy)     PSAs ok (followed by urologist)  . History of impaired glucose tolerance   . Shortness of breath    . CHF (congestive heart failure)   . Pacemaker     pacermaker /ICD/CRT---LV lead replacement 12/2012.  F/u echo showed mild improvement in mitral regurg but EF still 10%.  . Paronychia of right thumb 08/22/2012    Past Surgical History  Procedure Laterality Date  . Icd      a) Guidant Contak H170- Virl Axe, MD 07/25/06 (second device) b) Medtronic CRT-D  . Breast surgery  1990    "Tissue removed from breast"  . Umbilical hernia repair    . Hernia repair  1975    bilateral inguinal  . Extracorporeal shock wave lithotripsy    . Atrial ablation surgery    . Cardiac catheterization    . Esophagogastroduodenoscopy N/A 07/13/2012    Procedure: ESOPHAGOGASTRODUODENOSCOPY (EGD);  Surgeon: Jeryl Columbia, MD;  Location: [facility];  Service: Endoscopy;  Laterality: N/A;  NORMAL  . Colonoscopy N/A 07/13/2012    Procedure: COLONOSCOPY;  Surgeon: Jeryl Columbia, MD;  Location: [facility];  Service: Endoscopy;  Laterality: N/A; polypectomy x 1     Family History  Problem Relation Age of Onset  . Heart disease Mother   . Cancer Maternal Grandmother     breast  . Coronary artery  disease Other     family hx of  . Hyperlipidemia Other     family hx of  . Heart attack Father     4 stents/ pacemaker  . Cancer Father     prostate  . Hypertension Sister   . Goiter Paternal Grandmother   . Nephrolithiasis Son     History   Social History  . Marital Status: Married    Spouse Name: N/A    Number of Children: N/A  . Years of Education: N/A   Occupational History  . Retired    Social History Main Topics  . Smoking status: Former Smoker -- 2.00 packs/day for 15 years    Types: Cigarettes    Quit date: 01/15/1980  . Smokeless tobacco: Never Used  . Alcohol Use: No     Comment: former moderate drinker, none in 8 years  . Drug Use: No  . Sexually Active: Not on file   Other Topics Concern  . Not on file   Social History Narrative   Married.   Hx of cigarettes: quit in the  1980s.  No alc/drugs.    Current Outpatient Prescriptions on File Prior to Visit  Medication Sig Dispense Refill  . amiodarone (PACERONE) 200 MG tablet Take 1 tablet (200 mg total) by mouth daily.  90 tablet  3  . carvedilol (COREG) 25 MG tablet Take 0.5 tablets (12.5 mg total) by mouth 2 (two) times daily with a meal.  90 tablet  3  . Cholecalciferol (VITAMIN D PO) Take 3,000 mcg by mouth daily.      [name] eplerenone (INSPRA) 25 MG tablet Take 1 tablet (25 mg total) by mouth at bedtime.  90 tablet  3  . eplerenone (INSPRA) 25 MG tablet TAKE 1 TABLET BY MOUTH DAILY  90 tablet  0  . fluticasone (FLONASE) 50 MCG/ACT nasal spray Place 2 sprays into the nose 2 (two) times daily.  16 g  12  . furosemide (LASIX) 40 MG tablet TAKE 2 TABLETS BY MOUTH EVERY DAY  180 tablet  3  . hydroxypropyl methylcellulose (ISOPTO TEARS) 2.5 % ophthalmic solution Place 1 drop into both eyes daily as needed. For dry eyes      . ipratropium (ATROVENT) 0.06 % nasal spray Place 2 sprays into the nose at bedtime.  15 mL  6  . levocetirizine (XYZAL) 5 MG tablet Take 1 tablet (5 mg total) by mouth every evening.  30 tablet  6  . mexiletine (MEXITIL) 150 MG capsule Take 2 capsules (300 mg total) by mouth 2 (two) times daily.  360 capsule  3  . Multiple Vitamin (MULTIVITAMIN WITH MINERALS) TABS Take 1 tablet by mouth daily.      . nitroGLYCERIN (NITROSTAT) 0.4 MG SL tablet Place 1 tablet (0.4 mg total) under the tongue every 5 (five) minutes as needed.  25 tablet  3  . pantoprazole (PROTONIX) 40 MG tablet Take 1 tablet (40 mg total) by mouth 2 (two) times daily.  180 tablet  3  . potassium chloride SA (K-DUR,KLOR-CON) 20 MEQ tablet Take 1 tablet (20 mEq total) by mouth daily.  90 tablet  3  . valsartan (DIOVAN) 40 MG tablet Take 1 tablet (40 mg total) by mouth daily.  90 tablet  3  . VENTOLIN HFA 108 (90 BASE) MCG/ACT inhaler       . warfarin (COUMADIN) 5 MG tablet Take 2.5-5 mg by mouth daily. [name] and Sat = 2.5 5 mg all  other days      .  warfarin (COUMADIN) 5 MG tablet TAKE AS DIRECTED BY THE ANTICOAGULATION CLINIC.  30 tablet  3   No current facility-administered medications on file prior to visit.    Allergies  Allergen Reactions  . Sulfa Antibiotics Rash    Review of Systems  Review of Systems  Constitutional: Positive for fever and malaise/fatigue.  HENT: Negative for congestion.   Eyes: Negative for pain and discharge.  Respiratory: Negative for shortness of breath.   Cardiovascular: Negative for chest pain, palpitations and leg swelling.  Gastrointestinal: Negative for nausea, abdominal pain and diarrhea.  Genitourinary: Negative for dysuria.  Musculoskeletal: Positive for myalgias and joint pain. Negative for falls.       Right thumb pain  Skin: Negative for rash.  Neurological: Negative for loss of consciousness and headaches.  Endo/Heme/Allergies: Negative for polydipsia.  Psychiatric/Behavioral: Negative for depression and suicidal ideas. The patient is not nervous/anxious and does not have insomnia.     Objective  BP 148/90  Pulse 69  Temp(Src) 98.1 F (36.7 C) (Oral)  Ht _0  (1.803 m)  Wt 226 lb 1.8 oz (102.563 kg)  BMI 31.55 kg/m2  SpO2 94%  Physical Exam  Physical Exam  Constitutional: He is well-developed, well-nourished, and in no distress. No distress.  HENT:  Head: Normocephalic and atraumatic.  Mouth/Throat: No oropharyngeal exudate.  Eyes: Pupils are equal, round, and reactive to light.  Neck: Normal range of motion. Neck supple.  Cardiovascular: Normal rate.   Pulmonary/Chest: Effort normal and breath sounds normal.  Abdominal: Soft. He exhibits no distension.  Musculoskeletal: He exhibits no edema.  Skin: Skin is warm and dry. He is not diaphoretic.  Lateral edge of nail bed on right thumb very swollen, ecchymotic and erythematous. Pus pocket noted. Tender to touch    Lab Results  Component Value Date   TSH 0.97 02/13/2012   Lab Results  Component  Value Date   WBC 7.5 07/13/2012   HGB 14.8 07/13/2012   HCT 42.8 07/13/2012   MCV 98.8 07/13/2012   PLT 307 07/13/2012   Lab Results  Component Value Date   CREATININE 1.1 07/01/2012   BUN 19 07/01/2012   NA 133* 07/01/2012   K 3.7 07/01/2012   CL 98 07/01/2012   CO2 24 07/01/2012   Lab Results  Component Value Date   ALT 42 02/13/2012   AST 37 02/13/2012   ALKPHOS 56 02/13/2012   BILITOT 0.8 02/13/2012   Lab Results  Component Value Date   CHOL 159 04/11/2010   Lab Results  Component Value Date   HDL 38.80* 04/11/2010   Lab Results  Component Value Date   LDLCALC 90 04/11/2010   Lab Results  Component Value Date   TRIG 151.0* 04/11/2010   Lab Results  Component Value Date   CHOLHDL 4 04/11/2010     Assessment & Plan  HYPERTENSION Mild elevation with acute illness, no changes today  Paronychia of right thumb Soak in H2O2 and warm water tid, keep open to air and dry when able to keep clean. Cover with antibiotic ointment and bandaids when going out. Given shot of Rocephin 250 mg and started on Keflex and probiotics. Given Tramadol to use prn for severe pain. He is on coumadin and is aware of interactions with antibiotics. He agrees to notify coumadin clinic of antibiotic use for further instructions early in the week. Wound cleaned with peroxide and lanced with steroid needle and white pus was expelled

## 2012-08-22 NOTE — Assessment & Plan Note (Addendum)
Soak in H2O2 and warm water tid, keep open to air and dry when able to keep clean. Cover with antibiotic ointment and bandaids when going out. Given shot of Rocephin 250 mg and started on Keflex and probiotics. Given Tramadol to use prn for severe pain. He is on coumadin and is aware of interactions with antibiotics. He agrees to notify coumadin clinic of antibiotic use for further instructions early in the week. Wound cleaned with peroxide and lanced with steroid needle and white pus was expelled

## 2012-08-24 ENCOUNTER — Telehealth: Payer: Self-pay | Admitting: *Deleted

## 2012-08-24 NOTE — Telephone Encounter (Signed)
[facility] Triage Call Report Triage Record Num: [id number] Operator: Thereasa Parkin Patient Name: [name] Call Date & Time: 08/22/2012 8:36:07AM Patient Phone: [phone] PCP: Ricardo Jericho Patient Gender: Male PCP Fax : [phone] Patient DOB: 1940-04-03 Practice Name[facility] Reason for Call: Caller: Barkley Boards; PCP: Ricardo Jericho (Family Practice); CB#: [phone]; Call regarding Right Thumb Infection; Onset: 09/20/12. Afebrile. Thumb is red, swollen with purulent drainage from right side of nail. Suspected cut fingernail too close and cut skin; then was working in the garden. Tried Epson Salt soaks and Neospoorin without improvement. Advised to see MD wihtin 4 hours for spreading redness per Fingernail Infection guideline. Sitting in office parking lot. Scheduled for 08/22/12 at 1000 with Dr Randel Pigg. Advised to go inside office now since has arrived from out of town and perhaps staff could work him in sooner if someone does not show up on time for their appointment. Protocol(s) Used: Fingernail Infection (Pediatric) Recommended Outcome per Protocol: See Provider within 4 hours Reason for Outcome: [1] Spreading redness or red streaking AND [2] larger than 1 inch (2.5 cm) Care Advice: ~ CARE ADVICE given per Fingernail Infection (Pediatric) guideline. CALL BACK IF: * Your child becomes worse ~ ~ PAIN: For pain relief, give acetaminophen every 4 hours OR ibuprofen every 6 hours as needed. (See Dosage table.) REASSURANCE: * This sounds like a low grade infection of the cuticle (skinfold bordering the fingernail). * The cause is usually a bacterial infection with Staph. * Pulling at hangnails or other injury to the cuticle usually starts the process. ~ 08/

## 2012-08-25 ENCOUNTER — Ambulatory Visit (INDEPENDENT_AMBULATORY_CARE_PROVIDER_SITE_OTHER): Payer: Medicare Other | Admitting: Family Medicine

## 2012-08-25 ENCOUNTER — Encounter: Payer: Self-pay | Admitting: Family Medicine

## 2012-08-25 VITALS — BP 118/72 | HR 63 | Temp 97.0°F | Resp 18 | Ht 71.0 in | Wt 227.0 lb

## 2012-08-25 DIAGNOSIS — R5383 Other fatigue: Secondary | ICD-10-CM

## 2012-08-25 DIAGNOSIS — L03019 Cellulitis of unspecified finger: Secondary | ICD-10-CM | POA: Diagnosis not present

## 2012-08-25 DIAGNOSIS — L03011 Cellulitis of right finger: Secondary | ICD-10-CM

## 2012-08-25 DIAGNOSIS — R5381 Other malaise: Secondary | ICD-10-CM | POA: Insufficient documentation

## 2012-08-25 DIAGNOSIS — Z7901 Long term (current) use of anticoagulants: Secondary | ICD-10-CM

## 2012-08-25 LAB — COMPREHENSIVE METABOLIC PANEL
ALT: 66 U/L — ABNORMAL HIGH (ref 0–53)
AST: 54 U/L — ABNORMAL HIGH (ref 0–37)
Albumin: 3.7 g/dL (ref 3.5–5.2)
BUN: 16 mg/dL (ref 6–23)
Calcium: 9.1 mg/dL (ref 8.4–10.5)
Chloride: 103 mEq/L (ref 96–112)
Potassium: 4.2 mEq/L (ref 3.5–5.1)
Sodium: 138 mEq/L (ref 135–145)
Total Protein: 6.6 g/dL (ref 6.0–8.3)

## 2012-08-25 LAB — CBC WITH DIFFERENTIAL/PLATELET
Basophils Absolute: 0.1 10*3/uL (ref 0.0–0.1)
Hemoglobin: 14.7 g/dL (ref 13.0–17.0)
Lymphocytes Relative: 40.9 % (ref 12.0–46.0)
Monocytes Relative: 6.4 % (ref 3.0–12.0)
Neutro Abs: 3.1 10*3/uL (ref 1.4–7.7)
Neutrophils Relative %: 44 % (ref 43.0–77.0)
RBC: 4.27 Mil/uL (ref 4.22–5.81)
RDW: 13.8 % (ref 11.5–14.6)

## 2012-08-25 LAB — TSH: TSH: 1.51 u[IU]/mL (ref 0.35–5.50)

## 2012-08-25 MED ORDER — MUPIROCIN 2 % EX OINT: TOPICAL_OINTMENT | Freq: Three times a day (TID) | CUTANEOUS | Status: DC

## 2012-08-25 MED ORDER — AMOXICILLIN-POT CLAVULANATE 875-125 MG PO TABS: 1.0000 | ORAL_TABLET | Freq: Two times a day (BID) | ORAL | Status: DC

## 2012-08-25 NOTE — Assessment & Plan Note (Addendum)
Questionable improvement, still some areas that are fluctuant and some erythema is present to suggest some surrounding cellulitis. I decided to do a bit more of an I&D today and I changed antibiotic to augmentin 864m bid x 7d. PROCEDURE:  Sterile technique used, 1 cc of 1% lido w/out epi was used for local analgesia.  Tourniquet applied at base of thumb to assist with hemostasis since pt is on coumadin.  Used #11 scalpal to cut through the areas of fluctuance around the lateral nail border.  Basically bloody/serous mix came out.  Pt tolerated procedure well.  No immediate complications.  Pressure-dressed wound and discussed continued aftercare with soaking and application of bactroban ointment tid and I'll recheck his wound in 2d. Signs/symptoms to call or return for were reviewed and pt expressed understanding.

## 2012-08-25 NOTE — Progress Notes (Signed)
OFFICE NOTE  08/25/2012  CC:  Chief Complaint  Patient presents with  . Nail Problem    R thumb cuticle infection     HPI: Patient is a 72 y.o. Caucasian male who is here for right thumb paronychium. He was seen 08/22/12 by Dr. Charlett Blake and the area was punctured/drained and he was given IM rocephin and started on keflex.  He thinks it is better regarding pain, swelling, and redness.  Then, while I was looking at the wound he started to second guess himself and could not convince me that it was that much improved.   Denies fever, chills, nausea, or lightheadedness. He has taken only a couple of pain pills for the pain in his thumb.  He also complains of a vague feeling of "easily washed out" or "fatigue" for at least a few months.  He denies any chest pain or SOB with walking/exertion but says he sometimes breaks out in a sweat more easily than normal. No palpitations, no nausea, no arm pain or sensory changes.  Denies melena/hematochezia/hematuria. Has been compliant with all meds, compliant with coumadin clinic f/u:   Lab Results  Component Value Date   INR 2.1 08/19/2012   INR 2.1 08/05/2012   INR 3.8 07/22/2012   PROTIME 18.0 06/09/2008     Pertinent PMH:  Past Medical History  Diagnosis Date  . Atrial fibrillation     Ablation for a-flutter 05/2000  . Cardiomyopathy     Ischemic.  EF 20%  . Hypertension   . Hyperlipidemia   . Allergic rhinitis   . Obstructive sleep apnea     CPAP 16  . CAD (coronary artery disease)     PTCA RCA 1997 (Inf/post MI);  [name] Ventricular tachycardia     ICD, pacer  . Adenomatous colon polyp 2009; 06/2012    Colonoscopy 07/2007---Dr. Barron Schmid GI--Repeat 06/2012 showed one tubular adenoma w/out high grade dysplasia.  . Kidney stones     No distal stones on CT 2010  . Myocardial infarction   . Microscopic hematuria     Cysto neg 02/2009 except for BPH  . Diverticulosis of colon 2005    Noted on Colonoscopies in 2005 and 2009 and on CT 2010  .  Internal hemorrhoids     Colonoscopy 07/2007  . Hepatic steatosis 2010    Noted on CT abd 2010 and on u/s abd 04/2009 (transaminasemia)  . Allergic rhinitis   . BPH (benign prostatic hypertrophy)     PSAs ok (followed by urologist)  . History of impaired glucose tolerance   . Shortness of breath   . CHF (congestive heart failure)   . Pacemaker     pacermaker /ICD/CRT---LV lead replacement 12/2012.  F/u echo showed mild improvement in mitral regurg but EF still 10%.  . Paronychia of right thumb 08/22/2012   Past surgical, social, and family history reviewed and no changes noted since last office visit.  MEDS:  Outpatient Prescriptions Prior to Visit  Medication Sig Dispense Refill  . amiodarone (PACERONE) 200 MG tablet Take 1 tablet (200 mg total) by mouth daily.  90 tablet  3  . carvedilol (COREG) 25 MG tablet Take 0.5 tablets (12.5 mg total) by mouth 2 (two) times daily with a meal.  90 tablet  3  . Cholecalciferol (VITAMIN D PO) Take 3,000 mcg by mouth daily.      [name] eplerenone (INSPRA) 25 MG tablet Take 1 tablet (25 mg total) by mouth at bedtime.  90 tablet  3  . eplerenone (INSPRA) 25 MG tablet TAKE 1 TABLET BY MOUTH DAILY  90 tablet  0  . furosemide (LASIX) 40 MG tablet TAKE 2 TABLETS BY MOUTH EVERY DAY  180 tablet  3  . hydroxypropyl methylcellulose (ISOPTO TEARS) 2.5 % ophthalmic solution Place 1 drop into both eyes daily as needed. For dry eyes      . ipratropium (ATROVENT) 0.06 % nasal spray Place 2 sprays into the nose at bedtime.  15 mL  6  . levocetirizine (XYZAL) 5 MG tablet Take 1 tablet (5 mg total) by mouth every evening.  30 tablet  6  . mexiletine (MEXITIL) 150 MG capsule Take 2 capsules (300 mg total) by mouth 2 (two) times daily.  360 capsule  3  . Multiple Vitamin (MULTIVITAMIN WITH MINERALS) TABS Take 1 tablet by mouth daily.      . nitroGLYCERIN (NITROSTAT) 0.4 MG SL tablet Place 1 tablet (0.4 mg total) under the tongue every 5 (five) minutes as needed.  25 tablet  3   . pantoprazole (PROTONIX) 40 MG tablet Take 1 tablet (40 mg total) by mouth 2 (two) times daily.  180 tablet  3  . potassium chloride SA (K-DUR,KLOR-CON) 20 MEQ tablet Take 1 tablet (20 mEq total) by mouth daily.  90 tablet  3  . traMADol (ULTRAM) 50 MG tablet Take 1 tablet (50 mg total) by mouth every 8 (eight) hours as needed for pain.  40 tablet  0  . valsartan (DIOVAN) 40 MG tablet Take 1 tablet (40 mg total) by mouth daily.  90 tablet  3  . VENTOLIN HFA 108 (90 BASE) MCG/ACT inhaler       . warfarin (COUMADIN) 5 MG tablet Take 2.5-5 mg by mouth daily. [name] and Sat = 2.5 5 mg all other days      . warfarin (COUMADIN) 5 MG tablet TAKE AS DIRECTED BY THE ANTICOAGULATION CLINIC.  30 tablet  3  . cephALEXin (KEFLEX) 500 MG capsule Take 1 capsule (500 mg total) by mouth 4 (four) times daily.  40 capsule  0  . fluticasone (FLONASE) 50 MCG/ACT nasal spray Place 2 sprays into the nose 2 (two) times daily.  16 g  12   No facility-administered medications prior to visit.    PE: Blood pressure 118/72, pulse 63, temperature 97 F (36.1 C), temperature source Temporal, resp. rate 18, height _0  (1.803 m), weight 227 lb (102.967 kg), SpO2 96.00%. Gen: Alert, well appearing.  Patient is oriented to person, place, time, and situation. AFFECT: pleasant, lucid thought and speech. No pallor.  No jaundice.   Right thumb: medial nail border with swelling, erythema, fluctuance--extends about 3/4 cm back from nail edge.  More in midline of thumb just proximal to the nail border is a 1-2 cm area of erythema that is only minimally blanching with pressure applied.  There is a pinpoint area in center of the paronychium that is draining a scant amount of serosanquinous fluid.  ROM of thumb intact.  Distal thumb diffusely tender.  No thumb swelling other than the focal swelling around the nail border.  The nail itself looks normal.     IMPRESSION AND PLAN:  Paronychia of right thumb Questionable  improvement, still some areas that are fluctuant and some erythema is present to suggest some surrounding cellulitis. I decided to do a bit more of an I&D today and I changed antibiotic to augmentin 864m bid x 7d. PROCEDURE:  Sterile technique used, 1 cc  of 1% lido w/out epi was used for local analgesia.  Tourniquet applied at base of thumb to assist with hemostasis since pt is on coumadin.  Used #11 scalpal to cut through the areas of fluctuance around the lateral nail border.  Basically bloody/serous mix came out.  Pt tolerated procedure well.  No immediate complications.  Pressure-dressed wound and discussed continued aftercare with soaking and application of bactroban ointment tid and I'll recheck his wound in 2d. Signs/symptoms to call or return for were reviewed and pt expressed understanding.   Other malaise and fatigue I think he is feeling the effect of his cardiomyopathy + polypharmacy. We agreed it would be prudent to check CBC, TSH, and CMET + Magnesium level. Will also check PT/INR today since he has been on antibiotics lately.  An After Visit Summary was printed and given to the patient.  FOLLOW UP: 2d, recheck wound

## 2012-08-25 NOTE — Assessment & Plan Note (Signed)
I think he is feeling the effect of his cardiomyopathy + polypharmacy. We agreed it would be prudent to check CBC, TSH, and CMET + Magnesium level. Will also check PT/INR today since he has been on antibiotics lately.

## 2012-08-27 ENCOUNTER — Encounter: Payer: Self-pay | Admitting: Family Medicine

## 2012-08-27 ENCOUNTER — Ambulatory Visit (INDEPENDENT_AMBULATORY_CARE_PROVIDER_SITE_OTHER): Payer: Medicare Other | Admitting: Family Medicine

## 2012-08-27 VITALS — BP 136/87 | HR 62 | Temp 97.4°F | Resp 18 | Ht 71.0 in | Wt 225.0 lb

## 2012-08-27 DIAGNOSIS — L03011 Cellulitis of right finger: Secondary | ICD-10-CM

## 2012-08-27 DIAGNOSIS — L03019 Cellulitis of unspecified finger: Secondary | ICD-10-CM

## 2012-08-27 NOTE — Progress Notes (Signed)
OFFICE NOTE  08/27/2012  CC:  Chief Complaint  Patient presents with  . Hand Problem    recheck left thumb     HPI: Patient is a 72 y.o. Caucasian male who is here for 2 day f/u for right thumb paronychium--infected. I incised and drained it and changed his abx from cephalexin to augmentin.  INR was unchanged/therapeutic 2 d/a after being on cephalexin for several days.  CMET, CBC, TSH also reviewed--wnl. He denies fever.  Says the thumb feels better but he's not sure if it looks better. He is applying the bactroban only at night.  Tolerating augmentin well.  Pertinent PMH:  Past Medical History  Diagnosis Date  . Atrial fibrillation     Ablation for a-flutter 05/2000  . Cardiomyopathy     Ischemic.  EF 20%  . Hypertension   . Hyperlipidemia   . Allergic rhinitis   . Obstructive sleep apnea     CPAP 16  . CAD (coronary artery disease)     PTCA RCA 1997 (Inf/post MI);  [name] Ventricular tachycardia     ICD, pacer  . Adenomatous colon polyp 2009; 06/2012    Colonoscopy 07/2007---Dr. Barron Schmid GI--Repeat 06/2012 showed one tubular adenoma w/out high grade dysplasia.  . Kidney stones     No distal stones on CT 2010  . Myocardial infarction   . Microscopic hematuria     Cysto neg 02/2009 except for BPH  . Diverticulosis of colon 2005    Noted on Colonoscopies in 2005 and 2009 and on CT 2010  . Internal hemorrhoids     Colonoscopy 07/2007  . Hepatic steatosis 2010    Noted on CT abd 2010 and on u/s abd 04/2009 (transaminasemia)  . Allergic rhinitis   . BPH (benign prostatic hypertrophy)     PSAs ok (followed by urologist)  . History of impaired glucose tolerance   . Shortness of breath   . CHF (congestive heart failure)   . Pacemaker     pacermaker /ICD/CRT---LV lead replacement 12/2012.  F/u echo showed mild improvement in mitral regurg but EF still 10%.  . Paronychia of right thumb 08/22/2012   Past surgical, social, and family history reviewed and no changes noted since  last office visit.  MEDS:  Outpatient Prescriptions Prior to Visit  Medication Sig Dispense Refill  . amiodarone (PACERONE) 200 MG tablet Take 1 tablet (200 mg total) by mouth daily.  90 tablet  3  . amoxicillin-clavulanate (AUGMENTIN) 875-125 MG per tablet Take 1 tablet by mouth 2 (two) times daily.  14 tablet  0  . carvedilol (COREG) 25 MG tablet Take 0.5 tablets (12.5 mg total) by mouth 2 (two) times daily with a meal.  90 tablet  3  . Cholecalciferol (VITAMIN D PO) Take 3,000 mcg by mouth daily.      [name] eplerenone (INSPRA) 25 MG tablet Take 1 tablet (25 mg total) by mouth at bedtime.  90 tablet  3  . eplerenone (INSPRA) 25 MG tablet TAKE 1 TABLET BY MOUTH DAILY  90 tablet  0  . furosemide (LASIX) 40 MG tablet TAKE 2 TABLETS BY MOUTH EVERY DAY  180 tablet  3  . hydroxypropyl methylcellulose (ISOPTO TEARS) 2.5 % ophthalmic solution Place 1 drop into both eyes daily as needed. For dry eyes      . ipratropium (ATROVENT) 0.06 % nasal spray Place 2 sprays into the nose at bedtime.  15 mL  6  . levocetirizine (XYZAL) 5 MG tablet Take 1  tablet (5 mg total) by mouth every evening.  30 tablet  6  . mexiletine (MEXITIL) 150 MG capsule Take 2 capsules (300 mg total) by mouth 2 (two) times daily.  360 capsule  3  . Multiple Vitamin (MULTIVITAMIN WITH MINERALS) TABS Take 1 tablet by mouth daily.      . mupirocin ointment (BACTROBAN) 2 % Apply topically 3 (three) times daily.  15 g  1  . nitroGLYCERIN (NITROSTAT) 0.4 MG SL tablet Place 1 tablet (0.4 mg total) under the tongue every 5 (five) minutes as needed.  25 tablet  3  . pantoprazole (PROTONIX) 40 MG tablet Take 1 tablet (40 mg total) by mouth 2 (two) times daily.  180 tablet  3  . potassium chloride SA (K-DUR,KLOR-CON) 20 MEQ tablet Take 1 tablet (20 mEq total) by mouth daily.  90 tablet  3  . traMADol (ULTRAM) 50 MG tablet Take 1 tablet (50 mg total) by mouth every 8 (eight) hours as needed for pain.  40 tablet  0  . valsartan (DIOVAN) 40 MG tablet  Take 1 tablet (40 mg total) by mouth daily.  90 tablet  3  . VENTOLIN HFA 108 (90 BASE) MCG/ACT inhaler       . warfarin (COUMADIN) 5 MG tablet Take 2.5-5 mg by mouth daily. [name] and Sat = 2.5 5 mg all other days      . warfarin (COUMADIN) 5 MG tablet TAKE AS DIRECTED BY THE ANTICOAGULATION CLINIC.  30 tablet  3  . fluticasone (FLONASE) 50 MCG/ACT nasal spray Place 2 sprays into the nose 2 (two) times daily.  16 g  12   No facility-administered medications prior to visit.    PE: Blood pressure 136/87, pulse 62, temperature 97.4 F (36.3 C), temperature source Temporal, resp. rate 18, height _0  (1.803 m), weight 225 lb (102.059 kg), SpO2 96.00%. Gen: Alert, well appearing.  Patient is oriented to person, place, time, and situation. Right thumb with slight erythema just proximal and medial to nail border, mild focal tenderness at the site of incision done yesterday.  No fluctuance, no streaking.  Normal nail.  Some superficial peeling of skin is noted around the affected area.  IMPRESSION AND PLAN:  Infected paronychium--s/p incision and drainage.  Stable/improved on current oral abx, encouraged pt to use his bactroban ointment tid, continue soaks. Signs/symptoms to call or return for were reviewed and pt expressed understanding.   FOLLOW UP: 5-6 d

## 2012-09-02 ENCOUNTER — Ambulatory Visit (INDEPENDENT_AMBULATORY_CARE_PROVIDER_SITE_OTHER): Payer: Medicare Other | Admitting: Family Medicine

## 2012-09-02 ENCOUNTER — Encounter: Payer: Self-pay | Admitting: Family Medicine

## 2012-09-02 VITALS — BP 120/80 | HR 63 | Temp 98.2°F | Ht 71.0 in | Wt 226.2 lb

## 2012-09-02 DIAGNOSIS — L03019 Cellulitis of unspecified finger: Secondary | ICD-10-CM

## 2012-09-02 DIAGNOSIS — L03011 Cellulitis of right finger: Secondary | ICD-10-CM

## 2012-09-02 NOTE — Assessment & Plan Note (Signed)
Resolved. May stop topical abx.  He has finished all oral abx. Signs/symptoms to call or return for were reviewed and pt expressed understanding.

## 2012-09-02 NOTE — Progress Notes (Signed)
OFFICE NOTE  09/02/2012  CC:  Chief Complaint  Patient presents with  . Follow-up    wound     HPI: Patient is a 72 y.o. Caucasian male who is here for 6 d f/u right thumb paronychium. It has been 10d since I incised and drained it and changed his abx from cephalexin to augmentin.  He has now finished augmentin, continues soaks and bactroban ointment.  Thumb feels good--no pain---just feels a bit more swollen than left.  Pertinent PMH:  Past Medical History  Diagnosis Date  . Atrial fibrillation     Ablation for a-flutter 05/2000  . Cardiomyopathy     Ischemic.  EF 20%  . Hypertension   . Hyperlipidemia   . Allergic rhinitis   . Obstructive sleep apnea     CPAP 16  . CAD (coronary artery disease)     PTCA RCA 1997 (Inf/post MI);  [name] Ventricular tachycardia     ICD, pacer  . Adenomatous colon polyp 2009; 06/2012    Colonoscopy 07/2007---Dr. Barron Schmid GI--Repeat 06/2012 showed one tubular adenoma w/out high grade dysplasia.  . Kidney stones     No distal stones on CT 2010  . Myocardial infarction   . Microscopic hematuria     Cysto neg 02/2009 except for BPH  . Diverticulosis of colon 2005    Noted on Colonoscopies in 2005 and 2009 and on CT 2010  . Internal hemorrhoids     Colonoscopy 07/2007  . Hepatic steatosis 2010    Noted on CT abd 2010 and on u/s abd 04/2009 (transaminasemia)  . Allergic rhinitis   . BPH (benign prostatic hypertrophy)     PSAs ok (followed by urologist)  . History of impaired glucose tolerance   . Shortness of breath   . CHF (congestive heart failure)   . Pacemaker     pacermaker /ICD/CRT---LV lead replacement 12/2012.  F/u echo showed mild improvement in mitral regurg but EF still 10%.  . Paronychia of right thumb 08/22/2012   Past family and social history reviewed and there are no changes since the patient's last office visit with me.  MEDS:  Outpatient Prescriptions Prior to Visit  Medication Sig Dispense Refill  . amiodarone (PACERONE)  200 MG tablet Take 1 tablet (200 mg total) by mouth daily.  90 tablet  3  . carvedilol (COREG) 25 MG tablet Take 0.5 tablets (12.5 mg total) by mouth 2 (two) times daily with a meal.  90 tablet  3  . Cholecalciferol (VITAMIN D PO) Take 3,000 mcg by mouth daily.      [name] eplerenone (INSPRA) 25 MG tablet Take 1 tablet (25 mg total) by mouth at bedtime.  90 tablet  3  . eplerenone (INSPRA) 25 MG tablet TAKE 1 TABLET BY MOUTH DAILY  90 tablet  0  . furosemide (LASIX) 40 MG tablet TAKE 2 TABLETS BY MOUTH EVERY DAY  180 tablet  3  . hydroxypropyl methylcellulose (ISOPTO TEARS) 2.5 % ophthalmic solution Place 1 drop into both eyes daily as needed. For dry eyes      . ipratropium (ATROVENT) 0.06 % nasal spray Place 2 sprays into the nose at bedtime.  15 mL  6  . levocetirizine (XYZAL) 5 MG tablet Take 1 tablet (5 mg total) by mouth every evening.  30 tablet  6  . mexiletine (MEXITIL) 150 MG capsule Take 2 capsules (300 mg total) by mouth 2 (two) times daily.  360 capsule  3  . Multiple Vitamin (MULTIVITAMIN  WITH MINERALS) TABS Take 1 tablet by mouth daily.      . mupirocin ointment (BACTROBAN) 2 % Apply topically 3 (three) times daily.  15 g  1  . nitroGLYCERIN (NITROSTAT) 0.4 MG SL tablet Place 1 tablet (0.4 mg total) under the tongue every 5 (five) minutes as needed.  25 tablet  3  . pantoprazole (PROTONIX) 40 MG tablet Take 1 tablet (40 mg total) by mouth 2 (two) times daily.  180 tablet  3  . potassium chloride SA (K-DUR,KLOR-CON) 20 MEQ tablet Take 1 tablet (20 mEq total) by mouth daily.  90 tablet  3  . traMADol (ULTRAM) 50 MG tablet Take 1 tablet (50 mg total) by mouth every 8 (eight) hours as needed for pain.  40 tablet  0  . valsartan (DIOVAN) 40 MG tablet Take 1 tablet (40 mg total) by mouth daily.  90 tablet  3  . VENTOLIN HFA 108 (90 BASE) MCG/ACT inhaler       . warfarin (COUMADIN) 5 MG tablet Take 2.5-5 mg by mouth daily. [name] and Sat = 2.5 5 mg all other days      . warfarin (COUMADIN)  5 MG tablet TAKE AS DIRECTED BY THE ANTICOAGULATION CLINIC.  30 tablet  3  . amoxicillin-clavulanate (AUGMENTIN) 875-125 MG per tablet Take 1 tablet by mouth 2 (two) times daily.  14 tablet  0  . fluticasone (FLONASE) 50 MCG/ACT nasal spray Place 2 sprays into the nose 2 (two) times daily.  16 g  12   No facility-administered medications prior to visit.    PE: Blood pressure 120/80, pulse 63, temperature 98.2 F (36.8 C), temperature source Oral, height _0  (1.803 m), weight 226 lb 4 oz (102.626 kg), SpO2 97.00%. Gen: Alert, well appearing.  Patient is oriented to person, place, time, and situation. Right thumb w/out any noticeable swelling.  Some superficial desquamation present, pinkish/healthy skin noted below these regions.  No tenderness, no skin opening, no drainage.  IMPRESSION AND PLAN:  Healed paronychium.  No sign of anymore surrounding cellulitis.  No sign of deep tissue infection.  FOLLOW UP: prn

## 2012-09-17 ENCOUNTER — Ambulatory Visit (INDEPENDENT_AMBULATORY_CARE_PROVIDER_SITE_OTHER): Payer: Medicare Other | Admitting: *Deleted

## 2012-09-17 DIAGNOSIS — Z7901 Long term (current) use of anticoagulants: Secondary | ICD-10-CM

## 2012-09-17 DIAGNOSIS — I4891 Unspecified atrial fibrillation: Secondary | ICD-10-CM

## 2012-09-21 ENCOUNTER — Telehealth: Payer: Self-pay | Admitting: Family Medicine

## 2012-09-21 MED ORDER — LEVOCETIRIZINE DIHYDROCHLORIDE 5 MG PO TABS: 5.0000 mg | ORAL_TABLET | Freq: Every evening | ORAL | Status: DC

## 2012-09-21 NOTE — Telephone Encounter (Signed)
I sent eRx for this med today.-thx

## 2012-09-21 NOTE — Telephone Encounter (Signed)
Patient pharmacy sent refill request for levocetirizine 60m tabs.  I don't see this in his chart as a current medication.  Please advise if he is still supposed to be on this and if he needs the refill.

## 2012-09-25 ENCOUNTER — Emergency Department (HOSPITAL_COMMUNITY): Payer: Medicare Other

## 2012-09-25 ENCOUNTER — Observation Stay (HOSPITAL_COMMUNITY)
Admission: EM | Admit: 2012-09-25 | Discharge: 2012-09-26 | Disposition: A | Discharge status: Home / Self Care | Payer: Medicare Other | Attending: Internal Medicine | Admitting: Internal Medicine

## 2012-09-25 ENCOUNTER — Encounter (HOSPITAL_COMMUNITY): Payer: Self-pay | Admitting: Emergency Medicine

## 2012-09-25 DIAGNOSIS — I2589 Other forms of chronic ischemic heart disease: Secondary | ICD-10-CM | POA: Diagnosis not present

## 2012-09-25 DIAGNOSIS — Z95810 Presence of automatic (implantable) cardiac defibrillator: Secondary | ICD-10-CM | POA: Diagnosis not present

## 2012-09-25 DIAGNOSIS — I5042 Chronic combined systolic (congestive) and diastolic (congestive) heart failure: Secondary | ICD-10-CM | POA: Diagnosis present

## 2012-09-25 DIAGNOSIS — R209 Unspecified disturbances of skin sensation: Secondary | ICD-10-CM | POA: Diagnosis not present

## 2012-09-25 DIAGNOSIS — G459 Transient cerebral ischemic attack, unspecified: Principal | ICD-10-CM | POA: Insufficient documentation

## 2012-09-25 DIAGNOSIS — Z9861 Coronary angioplasty status: Secondary | ICD-10-CM | POA: Insufficient documentation

## 2012-09-25 DIAGNOSIS — R29810 Facial weakness: Secondary | ICD-10-CM | POA: Diagnosis not present

## 2012-09-25 DIAGNOSIS — I10 Essential (primary) hypertension: Secondary | ICD-10-CM | POA: Diagnosis present

## 2012-09-25 DIAGNOSIS — Z7901 Long term (current) use of anticoagulants: Secondary | ICD-10-CM | POA: Diagnosis not present

## 2012-09-25 DIAGNOSIS — I4891 Unspecified atrial fibrillation: Secondary | ICD-10-CM | POA: Diagnosis present

## 2012-09-25 DIAGNOSIS — I509 Heart failure, unspecified: Secondary | ICD-10-CM | POA: Insufficient documentation

## 2012-09-25 DIAGNOSIS — H538 Other visual disturbances: Secondary | ICD-10-CM | POA: Diagnosis not present

## 2012-09-25 DIAGNOSIS — E785 Hyperlipidemia, unspecified: Secondary | ICD-10-CM | POA: Diagnosis present

## 2012-09-25 DIAGNOSIS — I5022 Chronic systolic (congestive) heart failure: Secondary | ICD-10-CM | POA: Diagnosis not present

## 2012-09-25 DIAGNOSIS — Z79899 Other long term (current) drug therapy: Secondary | ICD-10-CM | POA: Insufficient documentation

## 2012-09-25 DIAGNOSIS — E876 Hypokalemia: Secondary | ICD-10-CM | POA: Diagnosis not present

## 2012-09-25 DIAGNOSIS — I2510 Atherosclerotic heart disease of native coronary artery without angina pectoris: Secondary | ICD-10-CM | POA: Diagnosis not present

## 2012-09-25 HISTORY — PX: OTHER SURGICAL HISTORY: SHX169

## 2012-09-25 LAB — BASIC METABOLIC PANEL
CO2: 28 mEq/L (ref 19–32)
Calcium: 9.3 mg/dL (ref 8.4–10.5)
Chloride: 99 mEq/L (ref 96–112)
Glucose, Bld: 110 mg/dL — ABNORMAL HIGH (ref 70–99)
Potassium: 3.4 mEq/L — ABNORMAL LOW (ref 3.5–5.1)
Sodium: 138 mEq/L (ref 135–145)

## 2012-09-25 LAB — POCT I-STAT TROPONIN I: Troponin i, poc: 0.01 ng/mL (ref 0.00–0.08)

## 2012-09-25 LAB — RAPID URINE DRUG SCREEN, HOSP PERFORMED
Amphetamines: NOT DETECTED
Barbiturates: NOT DETECTED

## 2012-09-25 LAB — CBC
Hemoglobin: 16.7 g/dL (ref 13.0–17.0)
MCH: 36 pg — ABNORMAL HIGH (ref 26.0–34.0)
Platelets: 303 10*3/uL (ref 150–400)
RBC: 4.64 MIL/uL (ref 4.22–5.81)
WBC: 7.3 10*3/uL (ref 4.0–10.5)

## 2012-09-25 LAB — PROTIME-INR
INR: 2.18 — ABNORMAL HIGH (ref 0.00–1.49)
Prothrombin Time: 23.6 seconds — ABNORMAL HIGH (ref 11.6–15.2)

## 2012-09-25 LAB — GLUCOSE, CAPILLARY: Glucose-Capillary: 100 mg/dL — ABNORMAL HIGH (ref 70–99)

## 2012-09-25 MED ORDER — FUROSEMIDE 80 MG PO TABS
80.0000 mg | ORAL_TABLET | Freq: Every day | ORAL | Status: DC
  Administered 2012-09-26: 80 mg via ORAL
  Filled 2012-09-25: qty 1

## 2012-09-25 MED ORDER — WARFARIN SODIUM 5 MG PO TABS
5.0000 mg | ORAL_TABLET | ORAL | Status: DC
  Filled 2012-09-25: qty 1

## 2012-09-25 MED ORDER — ALBUTEROL SULFATE HFA 108 (90 BASE) MCG/ACT IN AERS: 2.0000 | INHALATION_SPRAY | RESPIRATORY_TRACT | Status: DC | PRN

## 2012-09-25 MED ORDER — ADULT MULTIVITAMIN W/MINERALS CH
1.0000 | ORAL_TABLET | Freq: Every day | ORAL | Status: DC
  Administered 2012-09-26: 1 via ORAL
  Filled 2012-09-25: qty 1

## 2012-09-25 MED ORDER — WARFARIN SODIUM 5 MG PO TABS
5.0000 mg | ORAL_TABLET | ORAL | Status: DC
  Administered 2012-09-25: 5 mg via ORAL
  Filled 2012-09-25 (×2): qty 1

## 2012-09-25 MED ORDER — IRBESARTAN 75 MG PO TABS
75.0000 mg | ORAL_TABLET | Freq: Every day | ORAL | Status: DC
  Administered 2012-09-26: 75 mg via ORAL
  Filled 2012-09-25 (×2): qty 1

## 2012-09-25 MED ORDER — POTASSIUM CHLORIDE CRYS ER 20 MEQ PO TBCR
20.0000 meq | EXTENDED_RELEASE_TABLET | Freq: Once | ORAL | Status: DC
  Filled 2012-09-25: qty 1

## 2012-09-25 MED ORDER — SPIRONOLACTONE 25 MG PO TABS
25.0000 mg | ORAL_TABLET | Freq: Every day | ORAL | Status: DC
  Administered 2012-09-25 – 2012-09-26 (×2): 25 mg via ORAL
  Filled 2012-09-25 (×2): qty 1

## 2012-09-25 MED ORDER — MEXILETINE HCL 150 MG PO CAPS
300.0000 mg | ORAL_CAPSULE | Freq: Two times a day (BID) | ORAL | Status: DC
  Administered 2012-09-25 – 2012-09-26 (×2): 300 mg via ORAL
  Filled 2012-09-25 (×3): qty 2

## 2012-09-25 MED ORDER — WARFARIN - PHYSICIAN DOSING INPATIENT: Freq: Every day | Status: DC

## 2012-09-25 MED ORDER — POTASSIUM CHLORIDE CRYS ER 20 MEQ PO TBCR
20.0000 meq | EXTENDED_RELEASE_TABLET | Freq: Once | ORAL | Status: AC
  Administered 2012-09-25: 20 meq via ORAL
  Filled 2012-09-25: qty 1

## 2012-09-25 MED ORDER — HYDROMORPHONE HCL PF 1 MG/ML IJ SOLN: 1.0000 mg | INTRAMUSCULAR | Status: DC | PRN

## 2012-09-25 MED ORDER — ACETAMINOPHEN 325 MG PO TABS: 650.0000 mg | ORAL_TABLET | ORAL | Status: DC | PRN

## 2012-09-25 MED ORDER — CARVEDILOL 12.5 MG PO TABS
12.5000 mg | ORAL_TABLET | Freq: Two times a day (BID) | ORAL | Status: DC
  Administered 2012-09-25 – 2012-09-26 (×3): 12.5 mg via ORAL
  Filled 2012-09-25 (×4): qty 1

## 2012-09-25 MED ORDER — AMIODARONE HCL 200 MG PO TABS
200.0000 mg | ORAL_TABLET | Freq: Every day | ORAL | Status: DC
  Administered 2012-09-26: 200 mg via ORAL
  Filled 2012-09-25: qty 1

## 2012-09-25 MED ORDER — ONDANSETRON HCL 4 MG/2ML IJ SOLN: 4.0000 mg | Freq: Three times a day (TID) | INTRAMUSCULAR | Status: AC | PRN

## 2012-09-25 MED ORDER — LORATADINE 10 MG PO TABS
10.0000 mg | ORAL_TABLET | Freq: Every day | ORAL | Status: DC
  Filled 2012-09-25 (×3): qty 1

## 2012-09-25 MED ORDER — IPRATROPIUM BROMIDE 0.06 % NA SOLN
2.0000 | Freq: Every day | NASAL | Status: DC
  Administered 2012-09-25: 2 via NASAL
  Filled 2012-09-25: qty 15

## 2012-09-25 MED ORDER — WARFARIN SODIUM 2.5 MG PO TABS
2.5000 mg | ORAL_TABLET | ORAL | Status: DC
  Administered 2012-09-26: 2.5 mg via ORAL
  Filled 2012-09-25: qty 1

## 2012-09-25 MED ORDER — LEVOCETIRIZINE DIHYDROCHLORIDE 5 MG PO TABS: 5.0000 mg | ORAL_TABLET | Freq: Every evening | ORAL | Status: DC

## 2012-09-25 NOTE — ED Provider Notes (Signed)
CSN: [id number]     Arrival date & time 09/25/12  1406 History   None    Chief Complaint  Patient presents with  . Numbness   (Consider location/radiation/quality/duration/timing/severity/associated sxs/prior Treatment) Patient is a 72 y.o. male presenting with general illness. The history is provided by the patient. No language interpreter was used.  Illness Location:  Home Quality:  Numbness, weakness generalized, increased ringing in both ears Severity:  Mild Onset quality:  Sudden Timing:  Constant Progression:  Worsening Chronicity:  New Context:  Was at rest, sudden onset ringing of ears Relieved by:  Nothing Worsened by:  Nothing Ineffective treatments:  None tried Associated symptoms: fatigue   Associated symptoms: no abdominal pain, no chest pain, no congestion, no cough, no diarrhea, no ear pain, no fever, no headaches, no loss of consciousness, no myalgias, no nausea, no rash, no rhinorrhea, no shortness of breath, no sore throat, no vomiting and no wheezing   Risk factors:  Afib, pacemaker, CAD, CHF   Past Medical History  Diagnosis Date  . Atrial fibrillation     Ablation for a-flutter 05/2000  . Cardiomyopathy     Ischemic.  EF 20%  . Hypertension   . Hyperlipidemia   . Allergic rhinitis   . Obstructive sleep apnea     CPAP 16  . CAD (coronary artery disease)     PTCA RCA 1997 (Inf/post MI);  [name] Ventricular tachycardia     ICD, pacer  . Adenomatous colon polyp 2009; 06/2012    Colonoscopy 07/2007---Dr. Barron Schmid GI--Repeat 06/2012 showed one tubular adenoma w/out high grade dysplasia.  . Kidney stones     No distal stones on CT 2010  . Myocardial infarction   . Microscopic hematuria     Cysto neg 02/2009 except for BPH  . Diverticulosis of colon 2005    Noted on Colonoscopies in 2005 and 2009 and on CT 2010  . Internal hemorrhoids     Colonoscopy 07/2007  . Hepatic steatosis 2010    Noted on CT abd 2010 and on u/s abd 04/2009 (transaminasemia)  .  Allergic rhinitis   . BPH (benign prostatic hypertrophy)     PSAs ok (followed by urologist)  . History of impaired glucose tolerance   . Shortness of breath   . CHF (congestive heart failure)   . Pacemaker     pacermaker /ICD/CRT---LV lead replacement 12/2012.  F/u echo showed mild improvement in mitral regurg but EF still 10%.  . Paronychia of right thumb 08/22/2012   Past Surgical History  Procedure Laterality Date  . Icd      a) Guidant Contak H170- Virl Axe, MD 07/25/06 (second device) b) Medtronic CRT-D  . Breast surgery  1990    "Tissue removed from breast"  . Umbilical hernia repair    . Hernia repair  1975    bilateral inguinal  . Extracorporeal shock wave lithotripsy    . Atrial ablation surgery    . Cardiac catheterization    . Esophagogastroduodenoscopy N/A 07/13/2012    Procedure: ESOPHAGOGASTRODUODENOSCOPY (EGD);  Surgeon: Jeryl Columbia, MD;  Location: [facility];  Service: Endoscopy;  Laterality: N/A;  NORMAL  . Colonoscopy N/A 07/13/2012    Procedure: COLONOSCOPY;  Surgeon: Jeryl Columbia, MD;  Location: [facility];  Service: Endoscopy;  Laterality: N/A; polypectomy x 1    Family History  Problem Relation Age of Onset  . Heart disease Mother   . Cancer Maternal Grandmother     breast  . Coronary  artery disease Other     family hx of  . Hyperlipidemia Other     family hx of  . Heart attack Father     4 stents/ pacemaker  . Cancer Father     prostate  . Hypertension Sister   . Goiter Paternal Grandmother   . Nephrolithiasis Son    History  Substance Use Topics  . Smoking status: Former Smoker -- 2.00 packs/day for 15 years    Types: Cigarettes    Quit date: 01/15/1980  . Smokeless tobacco: Never Used  . Alcohol Use: No     Comment: former moderate drinker, none in 8 years    Review of Systems  Constitutional: Positive for fatigue. Negative for fever.  HENT: Negative for ear pain, congestion, sore throat and rhinorrhea.   Respiratory: Negative for  cough, shortness of breath and wheezing.   Cardiovascular: Negative for chest pain.  Gastrointestinal: Negative for nausea, vomiting, abdominal pain and diarrhea.  Genitourinary: Negative for dysuria and hematuria.  Musculoskeletal: Negative for myalgias.  Skin: Negative for rash.  Neurological: Positive for weakness and numbness. Negative for dizziness, seizures, loss of consciousness, syncope, light-headedness and headaches.  All other systems reviewed and are negative.    Allergies  Sulfa antibiotics  Home Medications   Current Outpatient Rx  Name  Route  Sig  Dispense  Refill  . amiodarone (PACERONE) 200 MG tablet   Oral   Take 1 tablet (200 mg total) by mouth daily.   90 tablet   3   . carvedilol (COREG) 25 MG tablet   Oral   Take 0.5 tablets (12.5 mg total) by mouth 2 (two) times daily with a meal.   90 tablet   3   . Cholecalciferol (VITAMIN D PO)   Oral   Take 3,000 mcg by mouth daily.         [name] eplerenone (INSPRA) 25 MG tablet   Oral   Take 1 tablet (25 mg total) by mouth at bedtime.   90 tablet   3   . EXPIRED: fluticasone (FLONASE) 50 MCG/ACT nasal spray   Nasal   Place 2 sprays into the nose 2 (two) times daily.   16 g   12   . hydroxypropyl methylcellulose (ISOPTO TEARS) 2.5 % ophthalmic solution   Both Eyes   Place 1 drop into both eyes daily as needed. For dry eyes         . ipratropium (ATROVENT) 0.06 % nasal spray   Nasal   Place 2 sprays into the nose at bedtime.   15 mL   6   . levocetirizine (XYZAL) 5 MG tablet   Oral   Take 1 tablet (5 mg total) by mouth every evening.   30 tablet   10   . mexiletine (MEXITIL) 150 MG capsule   Oral   Take 2 capsules (300 mg total) by mouth 2 (two) times daily.   360 capsule   3   . Multiple Vitamin (MULTIVITAMIN WITH MINERALS) TABS   Oral   Take 1 tablet by mouth daily.         . nitroGLYCERIN (NITROSTAT) 0.4 MG SL tablet   Sublingual   Place 1 tablet (0.4 mg total) under the tongue  every 5 (five) minutes as needed.   25 tablet   3   . valsartan (DIOVAN) 40 MG tablet   Oral   Take 1 tablet (40 mg total) by mouth daily.   90 tablet   3   .  VENTOLIN HFA 108 (90 BASE) MCG/ACT inhaler               . warfarin (COUMADIN) 5 MG tablet   Oral   Take 2.5-5 mg by mouth daily. Tue, Thur and Sat = 2.5 5 mg all other days          BP 131/89  Pulse 60  Temp(Src) 97.4 F (36.3 C) (Oral)  Resp 22  Ht _0  (1.803 m)  Wt 220 lb (99.791 kg)  BMI 30.7 kg/m2  SpO2 98% Physical Exam  Nursing note and vitals reviewed. Constitutional: He is oriented to person, place, and time. He appears well-developed and well-nourished.  HENT:  Head: Normocephalic and atraumatic.  Right Ear: External ear normal.  Left Ear: External ear normal.  Eyes: EOM are normal.  Neck: Normal range of motion. Neck supple.  Cardiovascular: Normal rate, regular rhythm and intact distal pulses.  Exam reveals no gallop and no friction rub.   No murmur heard. Pulmonary/Chest: Effort normal and breath sounds normal. No respiratory distress. He has no wheezes. He has no rales. He exhibits no tenderness.  Abdominal: Soft. Bowel sounds are normal. He exhibits no distension. There is no tenderness. There is no rebound.  Musculoskeletal: Normal range of motion. He exhibits no edema and no tenderness.  Lymphadenopathy:    He has no cervical adenopathy.  Neurological: He is alert and oriented to person, place, and time.  Neurologic exam: Fundoscopic exam: no papilledema, no hemorrhages, CN I-XII: grossly intact, Sensation: normal in both upper and lower extremities, diminished sensation in left face, Strength 5/5 in both upper and lower extremities, Coordination intact. Gait normal. Hearing normal  Skin: Skin is warm. No rash noted.  Psychiatric: He has a normal mood and affect. His behavior is normal.    ED Course  Procedures (including critical care time) Labs Review Labs Reviewed  CBC -  Abnormal; Notable for the following:    MCH 36.0 (*)    All other components within normal limits  BASIC METABOLIC PANEL - Abnormal; Notable for the following:    Potassium 3.4 (*)    Glucose, Bld 110 (*)    GFR calc non Af Amer 83 (*)    All other components within normal limits  PROTIME-INR - Abnormal; Notable for the following:    Prothrombin Time 23.6 (*)    INR 2.18 (*)    All other components within normal limits  LIPID PANEL  POCT I-STAT TROPONIN I   Imaging Review Dg Chest 2 View  09/25/2012   *RADIOLOGY REPORT*  Clinical Data: Facial numbness  CHEST - 2 VIEW  Comparison: 01/09/2012  Findings: The cardiac shadow is stable but mildly enlarged.  A defibrillator is again seen.  No focal infiltrate or sizable effusion is noted.  The osseous structures show no acute abnormality.  IMPRESSION: No acute abnormality noted.   Original Report Authenticated By: Inez Catalina, M.D.   Ct Head Wo Contrast  09/25/2012   CLINICAL DATA:  Right and right jaw numbness, ringing in ears, blurred vision  EXAM: CT HEAD WITHOUT CONTRAST  TECHNIQUE: Contiguous axial images were obtained from the base of the skull through the vertex without intravenous contrast.  COMPARISON:  01/14/2011  FINDINGS: No skull fracture is noted. Paranasal sinuses and mastoid air cells are unremarkable. No intracranial hemorrhage, mass effect or midline shift.  No acute cortical infarction. No mass lesion is noted on this unenhanced scan. Stable mild cerebral atrophy.  IMPRESSION: No acute intracranial abnormality.  Stable mild cerebral atrophy.   Electronically Signed   By: Lahoma Crocker   On: 09/25/2012 16:17    MDM   1. TIA (transient ischemic attack)   2. Atrial fibrillation   3. CAD (coronary artery disease)   4. CHF (congestive heart failure)    2:48 PM Pt is a 72 y.o. male with pertinent PMHX of afib,ischemic cardiomyopathy,HTN, HLD, CAD s/p PTCA to RCA 1997, OSA, VTach s/p ICD placement who presents to the ED with  Numbness.  Sudden onset 1-1.5 hours ago of numbness to right cheek and right arm. Increased ringing in ears from baseline. No hearing loss, no blurred, double or loss of vision. No syncope. Generalized weakness without aches/myalgias over the past week. Denies fevers or recent illness. Denies facial droop slurred speech. No disorientation. Denies chest pain, shortness of breath. Denies loss of function to extremity. Denies missing doses of coumadin. On coumadin for afib.  On exam: AFVSS: as above. Plan for stroke/TIA work up: CBC, BMP, iStat troponin, INR, APTT, EKG, CXR Ct head wo contrast. Will also interrogate pacemaker  EKG personally reviewed by myself showed Ventricular paced Rate of 60, PR NAms, QRS 111m QT/QTC 482/4862m left axis, without evidence of new ischemia. Comparison showed similar, indication: numbness  Review of labs: iStat troponin 0.01. CBC showed no leukocytosis, H&H 16.7/46.4. BMP showed hypokalemia, no other electrolyte abnormality. INr 2.18.  CT head wo contrast no acute intracranial abnormality.  CXr PA/LAT for weakness, numbness per my read showed no ptx, no pneumonia no acute abnormality  Plan to consult neurology for evaluation of possible TIA.  Per neurology concern for TIA recommended admission. Plan to consult Hospitalist for admission given concern for possible TIA and need for further workup  The patient appears reasonably stabilized for admission considering the current resources, flow, and capabilities available in the ED at this time, and I doubt any other [facility] further screening and/or treatment in the ED prior to admission.   Plan for admission to hospitalist for further evaluation and management. PT transferred to floor/ICU/OR VSS.  Labs, EKG and imaging reviewed by myself and considered in medical decision making if ordered.  Imaging interpreted by radiology. Pt was discussed with my attending, Dr. StDwyane DeeMD 09/25/12 2322

## 2012-09-25 NOTE — ED Notes (Signed)
Last 2 days feeling sluggish, having numbness in R arm and R jaw about an hour ago; and ringing in ears 10-15 minutes ago--usually has ringing in ears but has gotten worse; pt states he has pacemaker/defib; denies cp, sob, n/v; pt  Reports when he moves his head fast he gets blurred vision but otherwise vision is normal; face symmetrical, grips equal, no drift noted

## 2012-09-25 NOTE — ED Notes (Signed)
Patient transported to X-ray & CT °

## 2012-09-25 NOTE — H&P (Signed)
[facility] History and Physical  [name] GNO:[id number] DOB: [birth year] DOA: 09/25/2012  Referring physician:  PCP: [name], MD  Specialists:   Chief Complaint: paraesthesia   HPI: [name] is a 72 y.o. male with PMH of CAD s/p PTCA, a fib on coumadin, CHF EF 15% s/p ICD, HTN, HPL, presented with acute onset numbness right face and arm which lasted for 1 hour; denies any other focal neurological symptoms; no focal weakness, no change in speech, no dizziness; no chest pain, no palpitations, no SOB, no fever;  -patient was seen by neurologist in ED recommended to admit under obs    Review of Systems: The patient denies anorexia, fever, weight loss,, vision loss, decreased hearing, hoarseness, chest pain, syncope, dyspnea on exertion, peripheral edema, balance deficits, hemoptysis, abdominal pain, melena, hematochezia, severe indigestion/heartburn, hematuria, incontinence, genital sores, muscle weakness, suspicious skin lesions, transient blindness, difficulty walking, depression, unusual weight change, abnormal bleeding, enlarged lymph nodes, angioedema, and breast masses.    Past Medical History  Diagnosis Date  . Atrial fibrillation     Ablation for a-flutter 05/2000  . Cardiomyopathy     Ischemic.  EF 20%  . Hypertension   . Hyperlipidemia   . Allergic rhinitis   . Obstructive sleep apnea     CPAP 16  . CAD (coronary artery disease)     PTCA RCA 1997 (Inf/post MI);  [name] Ventricular tachycardia     ICD, pacer  . Adenomatous colon polyp 2009; 06/2012    Colonoscopy 07/2007---Dr. Barron Schmid GI--Repeat 06/2012 showed one tubular adenoma w/out high grade dysplasia.  . Kidney stones     No distal stones on CT 2010  . Myocardial infarction   . Microscopic hematuria     Cysto neg 02/2009 except for BPH  . Diverticulosis of colon 2005    Noted on Colonoscopies in 2005 and 2009 and on CT 2010  . Internal hemorrhoids     Colonoscopy 07/2007  . Hepatic steatosis  2010    Noted on CT abd 2010 and on u/s abd 04/2009 (transaminasemia)  . Allergic rhinitis   . BPH (benign prostatic hypertrophy)     PSAs ok (followed by urologist)  . History of impaired glucose tolerance   . Shortness of breath   . CHF (congestive heart failure)   . Pacemaker     pacermaker /ICD/CRT---LV lead replacement 12/2012.  F/u echo showed mild improvement in mitral regurg but EF still 10%.  . Paronychia of right thumb 08/22/2012   Past Surgical History  Procedure Laterality Date  . Icd      a) Guidant Contak H170- Virl Axe, MD 07/25/06 (second device) b) Medtronic CRT-D  . Breast surgery  1990    "Tissue removed from breast"  . Umbilical hernia repair    . Hernia repair  1975    bilateral inguinal  . Extracorporeal shock wave lithotripsy    . Atrial ablation surgery    . Cardiac catheterization    . Esophagogastroduodenoscopy N/A 07/13/2012    Procedure: ESOPHAGOGASTRODUODENOSCOPY (EGD);  Surgeon: Jeryl Columbia, MD;  Location: [facility];  Service: Endoscopy;  Laterality: N/A;  NORMAL  . Colonoscopy N/A 07/13/2012    Procedure: COLONOSCOPY;  Surgeon: Jeryl Columbia, MD;  Location: [facility];  Service: Endoscopy;  Laterality: N/A; polypectomy x 1    Social History:  reports that he quit smoking about 32 years ago. His smoking use included Cigarettes. He has a 30 pack-year smoking history. He has never  used smokeless tobacco. He reports that he does not drink alcohol or use illicit drugs. Home: where does patient live--home, ALF, SNF? and with whom if at home? Yes : Can patient participate in ADLs?  Allergies  Allergen Reactions  . Sulfa Antibiotics Rash    Family History  Problem Relation Age of Onset  . Heart disease Mother   . Cancer Maternal Grandmother     breast  . Coronary artery disease Other     family hx of  . Hyperlipidemia Other     family hx of  . Heart attack Father     4 stents/ pacemaker  . Cancer Father     prostate  . Hypertension Sister    . Goiter Paternal Grandmother   . Nephrolithiasis Son     (be sure to complete)  Prior to Admission medications   Medication Sig Start Date End Date Taking? Authorizing Provider  amiodarone (PACERONE) 200 MG tablet Take 1 tablet (200 mg total) by mouth daily. 04/07/12  Yes Deboraha Sprang, MD  carvedilol (COREG) 25 MG tablet Take 0.5 tablets (12.5 mg total) by mouth 2 (two) times daily with a meal. 04/07/12  Yes Deboraha Sprang, MD  Cholecalciferol (VITAMIN D PO) Take 3,000 mcg by mouth daily.   Yes Historical Provider, MD  eplerenone (INSPRA) 25 MG tablet Take 1 tablet (25 mg total) by mouth at bedtime. 04/07/12  Yes Deboraha Sprang, MD  fluticasone [facility]) 50 MCG/ACT nasal spray Place 2 sprays into the nose 2 (two) times daily. 05/06/11 09/25/12 Yes Collene Gobble, MD  furosemide (LASIX) 40 MG tablet Take 80 mg by mouth daily.   Yes Historical Provider, MD  hydroxypropyl methylcellulose (ISOPTO TEARS) 2.5 % ophthalmic solution Place 1 drop into both eyes daily as needed. For dry eyes   Yes Historical Provider, MD  ipratropium (ATROVENT) 0.06 % nasal spray Place 2 sprays into the nose at bedtime. 01/28/12  Yes Kathee Delton, MD  levocetirizine (XYZAL) 5 MG tablet Take 1 tablet (5 mg total) by mouth every evening. 09/21/12 09/21/13 Yes [name], MD  mexiletine (MEXITIL) 150 MG capsule Take 2 capsules (300 mg total) by mouth 2 (two) times daily. 04/07/12  Yes Deboraha Sprang, MD  Multiple Vitamin (MULTIVITAMIN WITH MINERALS) TABS Take 1 tablet by mouth daily.   Yes Historical Provider, MD  nitroGLYCERIN (NITROSTAT) 0.4 MG SL tablet Place 1 tablet (0.4 mg total) under the tongue every 5 (five) minutes as needed. 04/07/12  Yes Deboraha Sprang, MD  valsartan (DIOVAN) 40 MG tablet Take 1 tablet (40 mg total) by mouth daily. 08/19/12  Yes [name], MD  VENTOLIN HFA 108 (90 BASE) MCG/ACT inhaler Inhale 2 puffs into the lungs as needed for shortness of breath.  04/13/12  Yes Historical Provider, MD   warfarin (COUMADIN) 5 MG tablet Take 2.5-5 mg by mouth every evening. [name], Sat, and Sunday  = 2.5 5 mg all other days 01/04/11  Yes [name], MD   Physical Exam: Filed Vitals:   09/25/12 1610  BP: 121/75  Pulse: 61  Temp:   Resp: 17     General:  Alert, oriented   Eyes: EOMI, perrla   ENT: no oral ulcers   Neck: supple, no JVD   Cardiovascular: S1, S2, irregular   Respiratory: CTA BL   Abdomen: soft, NT, ND   Skin: no rash   Musculoskeletal: noLE edema   Psychiatric: no hallucinations   Neurologic: CN  2-12 intact, motor 5/5 symmetric   Labs on Admission:  Basic Metabolic Panel:  Recent Labs Lab 09/25/12 1415  NA 138  K 3.4*  CL 99  CO2 28  GLUCOSE 110*  BUN 11  CREATININE 0.93  CALCIUM 9.3   Liver Function Tests: No results found for this basename: AST, ALT, ALKPHOS, BILITOT, PROT, ALBUMIN,  in the last 168 hours No results found for this basename: LIPASE, AMYLASE,  in the last 168 hours No results found for this basename: AMMONIA,  in the last 168 hours CBC:  Recent Labs Lab 09/25/12 1415  WBC 7.3  HGB 16.7  HCT 46.4  MCV 100.0  PLT 303   Cardiac Enzymes: No results found for this basename: CKTOTAL, CKMB, CKMBINDEX, TROPONINI,  in the last 168 hours  BNP (last 3 results)  Recent Labs  10/04/11 1508 10/05/11 0239  PROBNP 1676.0* 1907.0*   CBG: No results found for this basename: GLUCAP,  in the last 168 hours  Radiological Exams on Admission: Dg Chest 2 View  09/25/2012   *RADIOLOGY REPORT*  Clinical Data: Facial numbness  CHEST - 2 VIEW  Comparison: 01/09/2012  Findings: The cardiac shadow is stable but mildly enlarged.  A defibrillator is again seen.  No focal infiltrate or sizable effusion is noted.  The osseous structures show no acute abnormality.  IMPRESSION: No acute abnormality noted.   Original Report Authenticated By: Inez Catalina, M.D.   Ct Head Wo Contrast  09/25/2012   CLINICAL DATA:  Right and right jaw  numbness, ringing in ears, blurred vision  EXAM: CT HEAD WITHOUT CONTRAST  TECHNIQUE: Contiguous axial images were obtained from the base of the skull through the vertex without intravenous contrast.  COMPARISON:  01/14/2011  FINDINGS: No skull fracture is noted. Paranasal sinuses and mastoid air cells are unremarkable. No intracranial hemorrhage, mass effect or midline shift.  No acute cortical infarction. No mass lesion is noted on this unenhanced scan. Stable mild cerebral atrophy.  IMPRESSION: No acute intracranial abnormality. Stable mild cerebral atrophy.   Electronically Signed   By: Lahoma Crocker   On: 09/25/2012 16:17    EKG: Independently reviewed. Paced rhythm   Assessment/Plan Active Problems:   HYPERTENSION   TIA (transient ischemic attack)   HYPERLIPIDEMIA   Atrial fibrillation   SYSTOLIC HEART FAILURE, CHRONIC   CAD (coronary artery disease)   CHF (congestive heart failure)   72 y.o. male with PMH of CAD s/p PTCA, a fib on coumadin, CHF EF 15% s/p ICD, HTN, HPL, presented with acute onset numbness right face and arm which lasted for 1 hour  1. paraesthesia of unclear etiology; ? TIA; but patient appropriately anticoagulated; INR 2.1; ? Cervical neuropathy  -neuro exam non focal; CT head: NO acute findings  -cont coumadin, obtain doppler, echo; further imaging studies per neurology; input appreciated  2. CAD s/p PTCA; no acute issues;  -cont home regimen  3. CHF EF 15% s/p ICD; no clinical s/s of acute HF;  -cont home regimen  4. A fib s/p ablation; on AC/coumadin; INR 2.1; HR stable -cont home regimen  5. Hypo K likely diuretic related; replace    Neurology : if consultant consulted, please document name and whether formally or informally consulted  Code Status: full  (must indicate code status--if unknown or must be presumed, indicate so) Family Communication: not present at the bedside  (indicate person spoken with, if applicable, with phone number if by  telephone) Disposition Plan: home  (indicate anticipated LOS)  Time spent: > 35 minutes   Kinnie Feil [facility] Pager [phone]  If 7PM-7AM, please contact night-coverage [email or web address] Password [facility] 09/25/2012, 6:43 PM

## 2012-09-25 NOTE — ED Notes (Signed)
Neurologist MD at bedside.

## 2012-09-25 NOTE — Consult Note (Signed)
NEURO HOSPITALIST CONSULT NOTE    Reason for Consult:right face and arm numbness.  HPI:                                                                                                                                          [name] is an 72 y.o. male with a past medical history significant for HTN, hyperlipidemia, chronic ischemic cardiomyopathy EF 20%, MI, CAD s/p stenting, atrial fibrillation s/p ablation, v tach s/p pacemaker placement, obstructive sleep apnea on CPAP, comes in today with acute onset numbness right face and arm. Stated that couple of weeks ago he had a transient episode of numbness in his left chin and he did not pay attention to it, but today he got scared because he was driving and suddenly his right lower face became " like getting a novocaine shot". Then, approximately 15 minutes later the right arm also started feeling numb. The episode lasted for approximately 90 minutes and completely resolved. In addition, he reports increased ringing sensation in his ears at the time of that event. Denies associated headache, vertigo, double vision, difficulty swallowing, slurred speech,confusion, language or visual disturbances.  He has been on coumadin and said that his INR was 2.5 when checked 2 weeks ago, and today INR 2.18 CT brain revealed no acute abnormality.    Past Medical History  Diagnosis Date  . Atrial fibrillation     Ablation for a-flutter 05/2000  . Cardiomyopathy     Ischemic.  EF 20%  . Hypertension   . Hyperlipidemia   . Allergic rhinitis   . Obstructive sleep apnea     CPAP 16  . CAD (coronary artery disease)     PTCA RCA 1997 (Inf/post MI);  [name] Ventricular tachycardia     ICD, pacer  . Adenomatous colon polyp 2009; 06/2012    Colonoscopy 07/2007---Dr. Barron Schmid GI--Repeat 06/2012 showed one tubular adenoma w/out high grade dysplasia.  . Kidney stones     No distal stones on CT 2010  . Myocardial infarction   . Microscopic  hematuria     Cysto neg 02/2009 except for BPH  . Diverticulosis of colon 2005    Noted on Colonoscopies in 2005 and 2009 and on CT 2010  . Internal hemorrhoids     Colonoscopy 07/2007  . Hepatic steatosis 2010    Noted on CT abd 2010 and on u/s abd 04/2009 (transaminasemia)  . Allergic rhinitis   . BPH (benign prostatic hypertrophy)     PSAs ok (followed by urologist)  . History of impaired glucose tolerance   . Shortness of breath   . CHF (congestive heart failure)   . Pacemaker     pacermaker /ICD/CRT---LV lead replacement 12/2012.  F/u echo showed mild improvement in mitral  regurg but EF still 10%.  . Paronychia of right thumb 08/22/2012    Past Surgical History  Procedure Laterality Date  . Icd      a) Guidant Contak H170- Virl Axe, MD 07/25/06 (second device) b) Medtronic CRT-D  . Breast surgery  1990    "Tissue removed from breast"  . Umbilical hernia repair    . Hernia repair  1975    bilateral inguinal  . Extracorporeal shock wave lithotripsy    . Atrial ablation surgery    . Cardiac catheterization    . Esophagogastroduodenoscopy N/A 07/13/2012    Procedure: ESOPHAGOGASTRODUODENOSCOPY (EGD);  Surgeon: Jeryl Columbia, MD;  Location: [facility];  Service: Endoscopy;  Laterality: N/A;  NORMAL  . Colonoscopy N/A 07/13/2012    Procedure: COLONOSCOPY;  Surgeon: Jeryl Columbia, MD;  Location: [facility];  Service: Endoscopy;  Laterality: N/A; polypectomy x 1     Family History  Problem Relation Age of Onset  . Heart disease Mother   . Cancer Maternal Grandmother     breast  . Coronary artery disease Other     family hx of  . Hyperlipidemia Other     family hx of  . Heart attack Father     4 stents/ pacemaker  . Cancer Father     prostate  . Hypertension Sister   . Goiter Paternal Grandmother   . Nephrolithiasis Son     Social History:  reports that he quit smoking about 32 years ago. His smoking use included Cigarettes. He has a 30 pack-year smoking history. He  has never used smokeless tobacco. He reports that he does not drink alcohol or use illicit drugs.  Allergies  Allergen Reactions  . Sulfa Antibiotics Rash    MEDICATIONS:                                                                                                                     I have reviewed the patient's current medications.   ROS:                                                                                                                                       History obtained from the patient and char review.  General ROS: negative for - chills, fatigue, fever, night sweats, weight gain or weight loss. Feeling " sluggish in the last 2 days". Psychological ROS: negative for - behavioral  disorder, hallucinations, memory difficulties, mood swings or suicidal ideation Ophthalmic ROS: negative for - blurry vision, double vision, eye pain or loss of vision ENT ROS: negative for - epistaxis, nasal discharge, oral lesions, sore throat, tinnitus or vertigo Allergy and Immunology ROS: negative for - hives or itchy/watery eyes Hematological and Lymphatic ROS: negative for - bleeding problems, bruising or swollen lymph nodes Endocrine ROS: negative for - galactorrhea, hair pattern changes, polydipsia/polyuria or temperature intolerance Respiratory ROS: negative for - cough, hemoptysis, shortness of breath or wheezing Cardiovascular ROS: negative for - chest pain, dyspnea on exertion, edema or irregular heartbeat Gastrointestinal ROS: negative for - abdominal pain, diarrhea, hematemesis, nausea/vomiting or stool incontinence Genito-Urinary ROS: negative for - dysuria, hematuria, incontinence or urinary frequency/urgency Musculoskeletal ROS: negative for - joint swelling or muscular weakness Neurological ROS: as noted in HPI Dermatological ROS: negative for rash and skin lesion changes     Physical exam: pleasant male in no apparent distress.Blood pressure 121/75, pulse 61,  temperature 97.4 F (36.3 C), temperature source Oral, resp. rate 17, height _0  (1.803 m), weight 99.791 kg (220 lb), SpO2 98.00%.  Head: normocephalic. Neck: supple, no bruits, no JVD. Cardiac: no murmurs. Lungs: clear. Abdomen: soft, no tender, no mass. Extremities: no edema.  Neurologic Examination:                                                                                                      Mental Status: Alert, awake, oriented x 4, thought content appropriate.  Comprehension, naming, and repetition intact. Speech fluent without evidence of aphasia.  Able to follow 3 step commands without difficulty. Cranial Nerves: II: Discs flat bilaterally; Visual fields grossly normal, pupils equal, round, reactive to light and accommodation III,IV, VI: ptosis not present, extra-ocular motions intact bilaterally V,VII: smile symmetric, facial light touch sensation normal bilaterally VIII: hearing normal bilaterally IX,X: gag reflex present XI: bilateral shoulder shrug XII: midline tongue extension Motor: Right : Upper extremity   5/5    Left:     Upper extremity   5/5  Lower extremity   5/5     Lower extremity   5/5 Tone and bulk:normal tone throughout; no atrophy noted Sensory: Pinprick and light touch intact throughout, bilaterally Deep Tendon Reflexes:  1+ all over Plantars: Right: downgoing   Left: downgoing Cerebellar: normal finger-to-nose,  normal heel-to-shin test Gait:  No ataxia. CV: pulses palpable throughout    Lab Results  Component Value Date/Time   CHOL 159 04/11/2010 10:16 AM    Results for orders placed during the hospital encounter of 09/25/12 (from the past 48 hour(s))  CBC     Status: Abnormal   Collection Time    09/25/12  2:15 PM      Result Value Range   WBC 7.3  4.0 - 10.5 K/uL   RBC 4.64  4.22 - 5.81 MIL/uL   Hemoglobin 16.7  13.0 - 17.0 g/dL   HCT 46.4  39.0 - 52.0 %   MCV 100.0  78.0 - 100.0 fL   MCH 36.0 (*) 26.0 - 34.0 pg  MCHC 36.0   30.0 - 36.0 g/dL   RDW 14.2  11.5 - 15.5 %   Platelets 303  150 - 400 K/uL  BASIC METABOLIC PANEL     Status: Abnormal   Collection Time    09/25/12  2:15 PM      Result Value Range   Sodium 138  135 - 145 mEq/L   Potassium 3.4 (*) 3.5 - 5.1 mEq/L   Chloride 99  96 - 112 mEq/L   CO2 28  19 - 32 mEq/L   Glucose, Bld 110 (*) 70 - 99 mg/dL   BUN 11  6 - 23 mg/dL   Creatinine, Ser 0.93  0.50 - 1.35 mg/dL   Calcium 9.3  8.4 - 10.5 mg/dL   GFR calc non Af Amer 83 (*) >90 mL/min   GFR calc Af Amer >90  >90 mL/min   Comment: (NOTE)     The eGFR has been calculated using the CKD EPI equation.     This calculation has not been validated in all clinical situations.     eGFR's persistently <90 mL/min signify possible Chronic Kidney     Disease.  PROTIME-INR     Status: Abnormal   Collection Time    09/25/12  2:15 PM      Result Value Range   Prothrombin Time 23.6 (*) 11.6 - 15.2 seconds   INR 2.18 (*) 0.00 - 1.49  POCT I-STAT TROPONIN I     Status: None   Collection Time    09/25/12  2:31 PM      Result Value Range   Troponin i, poc 0.01  0.00 - 0.08 ng/mL   Comment 3            Comment: Due to the release kinetics of cTnI,     a negative result within the first hours     of the onset of symptoms does not rule out     myocardial infarction with certainty.     If myocardial infarction is still suspected,     repeat the test at appropriate intervals.    Dg Chest 2 View  09/25/2012   *RADIOLOGY REPORT*  Clinical Data: Facial numbness  CHEST - 2 VIEW  Comparison: 01/09/2012  Findings: The cardiac shadow is stable but mildly enlarged.  A defibrillator is again seen.  No focal infiltrate or sizable effusion is noted.  The osseous structures show no acute abnormality.  IMPRESSION: No acute abnormality noted.   Original Report Authenticated By: Inez Catalina, M.D.   Ct Head Wo Contrast  09/25/2012   CLINICAL DATA:  Right and right jaw numbness, ringing in ears, blurred vision  EXAM: CT HEAD  WITHOUT CONTRAST  TECHNIQUE: Contiguous axial images were obtained from the base of the skull through the vertex without intravenous contrast.  COMPARISON:  01/14/2011  FINDINGS: No skull fracture is noted. Paranasal sinuses and mastoid air cells are unremarkable. No intracranial hemorrhage, mass effect or midline shift.  No acute cortical infarction. No mass lesion is noted on this unenhanced scan. Stable mild cerebral atrophy.  IMPRESSION: No acute intracranial abnormality. Stable mild cerebral atrophy.   Electronically Signed   By: Lahoma Crocker   On: 09/25/2012 16:17     Assessment/Plan: 72 y/o with multiple risk factors for stroke and acute episode of right face and arm numbness that resolved in 90 minutes. Currently asymptomatic and NIHSS 0. INR 2.18 Probable TIA left anterior circulation. Suggest admission for completion of TIA work up.  Continue coumadin. Will not add antiplatelet agent at this moment. Will follow up.   Dorian Pod, MD [facility] [phone]  09/25/2012, 5:15 PM

## 2012-09-25 NOTE — ED Notes (Signed)
Informed Joey with [employer] that we need pt's AICD interrogated

## 2012-09-25 NOTE — ED Notes (Signed)
Rep at bedside interrogating AICD

## 2012-09-26 DIAGNOSIS — I2510 Atherosclerotic heart disease of native coronary artery without angina pectoris: Secondary | ICD-10-CM

## 2012-09-26 DIAGNOSIS — G459 Transient cerebral ischemic attack, unspecified: Secondary | ICD-10-CM

## 2012-09-26 DIAGNOSIS — I4891 Unspecified atrial fibrillation: Secondary | ICD-10-CM

## 2012-09-26 DIAGNOSIS — I509 Heart failure, unspecified: Secondary | ICD-10-CM

## 2012-09-26 DIAGNOSIS — E876 Hypokalemia: Secondary | ICD-10-CM

## 2012-09-26 LAB — BASIC METABOLIC PANEL
Calcium: 9.2 mg/dL (ref 8.4–10.5)
GFR calc Af Amer: >90 mL/min (ref >90)
GFR calc non Af Amer: 85 mL/min — ABNORMAL LOW (ref >90)
Glucose, Bld: 104 mg/dL — ABNORMAL HIGH (ref 70–99)
Potassium: 3.7 mEq/L (ref 3.5–5.1)
Sodium: 139 mEq/L (ref 135–145)

## 2012-09-26 LAB — LIPID PANEL: Cholesterol: 287 mg/dL — ABNORMAL HIGH (ref 0–200)

## 2012-09-26 LAB — PROTIME-INR
INR: 2.44 — ABNORMAL HIGH (ref 0.00–1.49)
Prothrombin Time: 25.7 seconds — ABNORMAL HIGH (ref 11.6–15.2)

## 2012-09-26 LAB — GLUCOSE, CAPILLARY: Glucose-Capillary: 101 mg/dL — ABNORMAL HIGH (ref 70–99)

## 2012-09-26 LAB — HEMOGLOBIN A1C: Mean Plasma Glucose: 111 mg/dL (ref <117)

## 2012-09-26 MED ORDER — NAPHAZOLINE HCL 0.1 % OP SOLN
1.0000 [drp] | Freq: Four times a day (QID) | OPHTHALMIC | Status: DC | PRN
  Administered 2012-09-26: 1 [drp] via OPHTHALMIC
  Filled 2012-09-26: qty 15

## 2012-09-26 MED ORDER — POTASSIUM CHLORIDE CRYS ER 20 MEQ PO TBCR: 20.0000 meq | EXTENDED_RELEASE_TABLET | Freq: Every day | ORAL | Status: DC

## 2012-09-26 MED ORDER — PRAVASTATIN SODIUM 40 MG PO TABS: 40.0000 mg | ORAL_TABLET | Freq: Every day | ORAL | Status: DC

## 2012-09-26 NOTE — Progress Notes (Signed)
  Echocardiogram 2D Echocardiogram has been performed.  [name] 09/26/2012, 4:58 PM

## 2012-09-26 NOTE — Discharge Summary (Signed)
Physician Discharge Summary  [name] JZP:[id number] DOB: [birth year] DOA: 09/25/2012  PCP: [name], MD  Admit date: 09/25/2012 Discharge date: 09/26/2012  Time spent: >30 minutes  Recommendations for Outpatient Follow-up:  1. Follow 2-D echo results 2. Follow Vit D and B12 level 3. BMET to follow electrolytes and kidney function  Discharge Diagnoses:  Active Problems:   HYPERLIPIDEMIA   HYPERTENSION   Atrial fibrillation   SYSTOLIC HEART FAILURE, CHRONIC   CAD (coronary artery disease)   CHF (congestive heart failure)   TIA (transient ischemic attack)   Discharge Condition: stable and improved. Will discharge home with instructions to take medications as prescribed; to follow with PCP in 1 week and to follow with cardiology as instructed.  Diet recommendation: heart healthy diet  Filed Weights   09/25/12 1419 09/25/12 2000  Weight: 99.791 kg (220 lb) 99.791 kg (220 lb)    History of present illness:  72 y.o. male with PMH of CAD s/p PTCA, a fib on coumadin, CHF EF 15% s/p ICD, HTN, HPL, presented with acute onset numbness right face and arm which lasted for 1 hour; denies any other focal neurological symptoms; no focal weakness, no change in speech, no dizziness; no chest pain, no palpitations, no SOB, no fever;  -patient was seen by neurologist in ED recommended to admit under [facility] Course:  1. Paraesthesia: with concerns for ? TIA; but patient appropriately anticoagulated; INR 2.1 and CT scan neg for acute changes (no MRI done due to pacemaker/ICD). -neuro exam non focal;  -cont coumadin,  -carotid doppler demonstrating mild ICA stenosis bilaterally and antegrade flow; discussed with neurology and do not believe symptoms secondary to that. Telemetry remains rate controlled and mainly SR. -2 D echo done but pending at discharge -will add statins as LDL in 200 range -paresthesia most likely due to hypokalemia as well; electrolytes repleted and patient  started on daily maintenance supplementation -please follow B12 and Vit D   2. CAD s/p PTCA; no acute issues;  -cont ACE, B-blocker and statins now  3. CHF EF 15% s/p ICD; no clinical s/s of acute HF;  -cont home regimen and follow up with cardiology -s/p ICD -2-Echo done and pending at this point  4. A fib s/p ablation; on AC/coumadin; INR 2.1; HR stable  -cont amiodarone, B-blocker and coumadin   5. Hypo K likely diuretic related; repleted and started on daily supplementation  *Rest of medical problems remains stable and the plan is to continue current medication regimen and  Patient will follow with PCP for further adjustments.   Procedures:  See below for x-ray reports  2-D echo (pending)  Carotid dopplers (mild bilateral ICA stenosis, 39%)  Consultations:  neurology  Discharge Exam: Filed Vitals:   09/26/12 1643  BP: 126/76  Pulse: 61  Temp: 98.1 F (36.7 C)  Resp: 18    General: NAD, afebrile, denies any numbness, paresthesia, weakness or acute complaints Cardiovascular: S1 and S2, no rubs or gallops Respiratory: CTA bilaterally Abdomen: soft, NT, ND, positive BS Extremities: trace edema, no cyanosis Neuro: no focal deficit   Discharge Instructions  Discharge Orders   Future Appointments Provider Department Dept Phone   10/22/2012 8:25 AM [facility] [place]) [phone]   10/22/2012 1:15 PM [facility] [phone]   11/11/2012 1:15 PM [name], MD [place] [facility] [phone]   12/24/2012 1:45 PM Deboraha Sprang, MD [place]  [place]) [phone]   02/02/2013 11:15 AM Kathee Delton, MD [facility] [phone]   Future Orders Complete By Expires   Diet - low sodium heart healthy  As directed    Discharge instructions  As directed    Comments:     Take medications as prescribed Follow with PCP in 7-10  days Follow a heart healthy diet Follow with cardiology as scheduled/instructed prior to admission       Medication List         amiodarone 200 MG tablet  Commonly known as:  PACERONE  Take 1 tablet (200 mg total) by mouth daily.     carvedilol 25 MG tablet  Commonly known as:  COREG  Take 0.5 tablets (12.5 mg total) by mouth 2 (two) times daily with a meal.     eplerenone 25 MG tablet  Commonly known as:  INSPRA  Take 1 tablet (25 mg total) by mouth at bedtime.     fluticasone 50 MCG/ACT nasal spray  Commonly known as:  FLONASE  Place 2 sprays into the nose 2 (two) times daily.     furosemide 40 MG tablet  Commonly known as:  LASIX  Take 80 mg by mouth daily.     hydroxypropyl methylcellulose 2.5 % ophthalmic solution  Commonly known as:  ISOPTO TEARS  Place 1 drop into both eyes daily as needed. For dry eyes     ipratropium 0.06 % nasal spray  Commonly known as:  ATROVENT  Place 2 sprays into the nose at bedtime.     levocetirizine 5 MG tablet  Commonly known as:  XYZAL  Take 1 tablet (5 mg total) by mouth every evening.     mexiletine 150 MG capsule  Commonly known as:  MEXITIL  Take 2 capsules (300 mg total) by mouth 2 (two) times daily.     multivitamin with minerals Tabs tablet  Take 1 tablet by mouth daily.     nitroGLYCERIN 0.4 MG SL tablet  Commonly known as:  NITROSTAT  Place 1 tablet (0.4 mg total) under the tongue every 5 (five) minutes as needed.     potassium chloride SA 20 MEQ tablet  Commonly known as:  K-DUR,KLOR-CON  Take 1 tablet (20 mEq total) by mouth daily.     valsartan 40 MG tablet  Commonly known as:  DIOVAN  Take 1 tablet (40 mg total) by mouth daily.     VENTOLIN HFA 108 (90 BASE) MCG/ACT inhaler  Generic drug:  albuterol  Inhale 2 puffs into the lungs as needed for shortness of breath.     VITAMIN D PO  Take 3,000 mcg by mouth daily.     warfarin 5 MG tablet  Commonly known as:  COUMADIN  - Take 2.5-5 mg by mouth every  evening. [name], Sat, and Sunday  = 2.5  - 5 mg all other days       Allergies  Allergen Reactions  . Sulfa Antibiotics Rash       Follow-up Information   Follow up with MCGOWEN,PHILIP H, MD. Schedule an appointment as soon as possible for a visit in 1 week.   Specialty:  Family Medicine   Contact information:   [address] [place] [address] [phone]       The results of significant diagnostics from this hospitalization (including imaging, microbiology, ancillary and laboratory) are listed below for reference.    Significant Diagnostic Studies: Dg Chest 2 View  09/25/2012   *RADIOLOGY REPORT*  Clinical Data: Facial numbness  CHEST - 2 VIEW  Comparison: 01/09/2012  Findings: The cardiac shadow is stable but mildly enlarged.  A defibrillator is again seen.  No focal infiltrate or sizable effusion is noted.  The osseous structures show no acute abnormality.  IMPRESSION: No acute abnormality noted.   Original Report Authenticated By: Inez Catalina, M.D.   Ct Head Wo Contrast  09/25/2012   CLINICAL DATA:  Right and right jaw numbness, ringing in ears, blurred vision  EXAM: CT HEAD WITHOUT CONTRAST  TECHNIQUE: Contiguous axial images were obtained from the base of the skull through the vertex without intravenous contrast.  COMPARISON:  01/14/2011  FINDINGS: No skull fracture is noted. Paranasal sinuses and mastoid air cells are unremarkable. No intracranial hemorrhage, mass effect or midline shift.  No acute cortical infarction. No mass lesion is noted on this unenhanced scan. Stable mild cerebral atrophy.  IMPRESSION: No acute intracranial abnormality. Stable mild cerebral atrophy.   Electronically Signed   By: Lahoma Crocker   On: 09/25/2012 16:17   Labs: Basic Metabolic Panel:  Recent Labs Lab 09/25/12 1415 09/26/12 0500  NA 138 139  K 3.4* 3.7  CL 99 103  CO2 28 24  GLUCOSE 110* 104*  BUN 11 12  CREATININE 0.93 0.86  CALCIUM 9.3 9.2   CBC:  Recent  Labs Lab 09/25/12 1415  WBC 7.3  HGB 16.7  HCT 46.4  MCV 100.0  PLT 303   BNP: BNP (last 3 results)  Recent Labs  10/04/11 1508 10/05/11 0239  PROBNP 1676.0* 1907.0*   CBG:  Recent Labs Lab 09/25/12 2137 09/26/12 1154 09/26/12 1628  GLUCAP 100* 107* 101*    Signed:  Davi Rotan  [facility] 09/26/2012, 6:10 PM

## 2012-09-26 NOTE — Progress Notes (Signed)
VASCULAR LAB PRELIMINARY  PRELIMINARY  PRELIMINARY  PRELIMINARY  Carotid Dopplers completed.    Preliminary report:  1-39% ICA stenosis.  Vertebral artery flow is antegrade.  Yury Schaus, RVT 09/26/2012, 5:44 PM

## 2012-09-26 NOTE — ED Provider Notes (Signed)
I saw and evaluated the patient, reviewed the resident's note and I agree with the findings and plan. Pt c/o one sided numbness/paresthesias. Now at baseline. Neuro consult - rec admit to med service for cva workup. Motor/sens intact. Speech fluent.   Mirna Mires, MD 09/26/12 [phone]

## 2012-09-27 LAB — VITAMIN B12: Vitamin B-12: 658 pg/mL (ref 211–911)

## 2012-09-28 ENCOUNTER — Encounter: Payer: Self-pay | Admitting: Family Medicine

## 2012-10-05 DIAGNOSIS — Z23 Encounter for immunization: Secondary | ICD-10-CM | POA: Diagnosis not present

## 2012-10-12 ENCOUNTER — Other Ambulatory Visit: Payer: Self-pay | Admitting: *Deleted

## 2012-10-12 DIAGNOSIS — I429 Cardiomyopathy, unspecified: Secondary | ICD-10-CM

## 2012-10-12 DIAGNOSIS — I059 Rheumatic mitral valve disease, unspecified: Secondary | ICD-10-CM

## 2012-10-12 DIAGNOSIS — I4891 Unspecified atrial fibrillation: Secondary | ICD-10-CM

## 2012-10-12 MED ORDER — CARVEDILOL 12.5 MG PO TABS: 12.5000 mg | ORAL_TABLET | Freq: Two times a day (BID) | ORAL | Status: DC

## 2012-10-21 ENCOUNTER — Encounter (INDEPENDENT_AMBULATORY_CARE_PROVIDER_SITE_OTHER): Payer: Self-pay | Admitting: Surgery

## 2012-10-21 ENCOUNTER — Encounter (INDEPENDENT_AMBULATORY_CARE_PROVIDER_SITE_OTHER): Payer: Self-pay

## 2012-10-21 ENCOUNTER — Ambulatory Visit (INDEPENDENT_AMBULATORY_CARE_PROVIDER_SITE_OTHER): Payer: Medicare Other | Admitting: *Deleted

## 2012-10-21 ENCOUNTER — Ambulatory Visit (INDEPENDENT_AMBULATORY_CARE_PROVIDER_SITE_OTHER): Payer: Medicare Other | Admitting: Surgery

## 2012-10-21 VITALS — BP 138/92 | HR 64 | Temp 96.8°F | Resp 14 | Ht 71.0 in | Wt 229.8 lb

## 2012-10-21 DIAGNOSIS — I509 Heart failure, unspecified: Secondary | ICD-10-CM | POA: Diagnosis not present

## 2012-10-21 DIAGNOSIS — E669 Obesity, unspecified: Secondary | ICD-10-CM

## 2012-10-21 DIAGNOSIS — E663 Overweight: Secondary | ICD-10-CM | POA: Insufficient documentation

## 2012-10-21 DIAGNOSIS — K5909 Other constipation: Secondary | ICD-10-CM

## 2012-10-21 DIAGNOSIS — I4891 Unspecified atrial fibrillation: Secondary | ICD-10-CM

## 2012-10-21 DIAGNOSIS — Z7901 Long term (current) use of anticoagulants: Secondary | ICD-10-CM

## 2012-10-21 DIAGNOSIS — K429 Umbilical hernia without obstruction or gangrene: Secondary | ICD-10-CM

## 2012-10-21 DIAGNOSIS — K5900 Constipation, unspecified: Secondary | ICD-10-CM

## 2012-10-21 NOTE — Progress Notes (Signed)
Subjective:     Patient ID: [name], male   DOB: Dec 16, 1940, 72 y.o.   MRN: [id number]  HPI  [name]  12-Jan-1941 [id number]  Patient Care Team: [name], MD as PCP - General (Family Medicine) Fredricka Bonine, MD as Consulting Physician (Urology) Kathee Delton, MD as Consulting Physician (Pulmonary Disease) Jeryl Columbia, MD as Consulting Physician (Gastroenterology) Candee Furbish, MD as Consulting Physician (Cardiology) Adin Hector, MD as Consulting Physician (General Surgery)  This patient is a 72 y.o.male who presents today for surgical evaluation at the request of Dr. Watt Climes.   Reason for visit: Painful hernia and bellybutton.  Chronic constipation.  Pleasant obese male.  He has noticed a lump at his bellybutton for several years.  Attended a lecture I gave on hernias and repair a few years ago.  He struggles with some intermittent constipation with hard stools.  He had an episode where he accidentally swallowed glass.  This concerned him.  He had episodes of severe pain.  Took a laxative.  Cleaned himself out.  Felt better.  However gets recurrent pain at his bellybutton.  If he lies down it calms down.  No episodes of nausea or vomiting.  Tends to struggle with hyper flatulence as well.  He underwent upper and lower endoscopy.  No foreign body found.  No evidence of any perforation or irritation.  Because of his worsening symptoms of tenderness, Dr. Make out recommended considering surgical consultation.  The patient has atrial fibrillation that requires chronic anticoagulation.  Numerous cardiac issues with a decreased  Ejection fraction around 25%.  Followed by [facility] cardiology rather regularly.  He tells me he can walk three quarters of a mile without difficulty.   No dyspnea on exertion.  No exertional chest pain.  No new cardiac events.  He tolerated having his defibrillator exchanged by just holding warfarin only earlier in the year.  No need for a Lovenox  bridge.  He did have an episode of some neurological symptoms last month.  Workup was negative for TIA/stroke.  He feels like the bellybutton ring is becoming more symptomatic and wishes to consider surgery.  Patient Active Problem List   Diagnosis Date Noted  . Constipation, chronic 10/21/2012  . TIA (transient ischemic attack) 09/25/2012  . Other malaise and fatigue 08/25/2012  . Paronychia of right thumb 08/22/2012  . Viral syndrome 08/06/2012  . Vitamin D deficiency 07/28/2012  . CHF (congestive heart failure) 07/28/2012  . Chronotropic incompetence 10/25/2011  . Warfarin-induced coagulopathy 10/05/2011  . Elevated LFTs 10/05/2011  . Cough with hemoptysis 10/04/2011  . Dyspnea on exertion 10/03/2011  . History of non anemic vitamin B12 deficiency 05/29/2011  . Mild cognitive impairment 01/13/2011  . Ringing in ears 01/13/2011  . CAD (coronary artery disease) 09/14/2010  . Elevated transaminase level--on amiodarone 06/19/2010  . ICD (implantable cardiac defibrillator), biventricular, [employer] 05/16/2010  . Encounter for long-term (current) use of anticoagulants 04/11/2010  . Microscopic hematuria 04/04/2010  . CLAUDICATION 11/28/2009  . Atrial fibrillation 06/29/2009  . SYSTOLIC HEART FAILURE, CHRONIC 05/26/2009  . CARDIOMYOPATHY, ISCHEMIC 07/07/2008  . VENTRICULAR TACHYCARDIA 06/29/2008  . HYPERLIPIDEMIA 01/04/2008  . HYPERTENSION 01/04/2008  . ALLERGIC RHINITIS CAUSE UNSPECIFIED 03/04/2007  . OBSTRUCTIVE SLEEP APNEA 03/02/2007    Past Medical History  Diagnosis Date  . Atrial fibrillation     Ablation for a-flutter 05/2000  . Cardiomyopathy     Ischemic.  EF 20%  . Hypertension   . Hyperlipidemia   .  Allergic rhinitis   . Obstructive sleep apnea     CPAP 16  . CAD (coronary artery disease)     PTCA RCA 1997 (Inf/post MI);  [name] Ventricular tachycardia     ICD, pacer  . Adenomatous colon polyp 2009; 06/2012    Colonoscopy 07/2007---Dr. Barron Schmid  GI--Repeat 06/2012 showed one tubular adenoma w/out high grade dysplasia.  . Kidney stones     No distal stones on CT 2010  . Myocardial infarction   . Microscopic hematuria     Cysto neg 02/2009 except for BPH  . Diverticulosis of colon 2005    Noted on Colonoscopies in 2005 and 2009 and on CT 2010  . Internal hemorrhoids     Colonoscopy 07/2007  . Hepatic steatosis 2010    Noted on CT abd 2010 and on u/s abd 04/2009 (transaminasemia)  . Allergic rhinitis   . BPH (benign prostatic hypertrophy)     PSAs ok (followed by urologist)  . History of impaired glucose tolerance   . Shortness of breath   . CHF (congestive heart failure)   . Pacemaker     pacermaker /ICD/CRT---LV lead replacement 12/2012.  F/u echo showed mild improvement in mitral regurg but EF still 10%.  . Paronychia of right thumb 08/22/2012    Past Surgical History  Procedure Laterality Date  . Icd      a) Guidant Contak H170- Virl Axe, MD 07/25/06 (second device) b) Medtronic CRT-D  . Breast surgery  1990    "Tissue removed from breast"  . Umbilical hernia repair    . Hernia repair  1975    bilateral inguinal  . Extracorporeal shock wave lithotripsy    . Atrial ablation surgery    . Cardiac catheterization    . Esophagogastroduodenoscopy N/A 07/13/2012    Procedure: ESOPHAGOGASTRODUODENOSCOPY (EGD);  Surgeon: Jeryl Columbia, MD;  Location: [facility];  Service: Endoscopy;  Laterality: N/A;  NORMAL  . Colonoscopy N/A 07/13/2012    Procedure: COLONOSCOPY;  Surgeon: Jeryl Columbia, MD;  Location: [facility];  Service: Endoscopy;  Laterality: N/A; polypectomy x 1   . Carotid doppler  09/25/12    No signif stenosis    History   Social History  . Marital Status: Married    Spouse Name: N/A    Number of Children: N/A  . Years of Education: N/A   Occupational History  . Retired    Social History Main Topics  . Smoking status: Former Smoker -- 2.00 packs/day for 15 years    Types: Cigarettes    Quit date:  01/15/1980  . Smokeless tobacco: Never Used  . Alcohol Use: Yes     Comment: couple glasses week  . Drug Use: No  . Sexual Activity: Not on file   Other Topics Concern  . Not on file   Social History Narrative   Married.   Hx of cigarettes: quit in the 1980s.  No alc/drugs.    Family History  Problem Relation Age of Onset  . Heart disease Mother   . Cancer Maternal Grandmother     breast  . Coronary artery disease Other     family hx of  . Hyperlipidemia Other     family hx of  . Heart attack Father     4 stents/ pacemaker  . Cancer Father     prostate  . Hypertension Sister   . Goiter Paternal Grandmother   . Nephrolithiasis Son     Current Outpatient Prescriptions  Medication  Sig Dispense Refill  . amiodarone (PACERONE) 200 MG tablet Take 1 tablet (200 mg total) by mouth daily.  90 tablet  3  . carvedilol (COREG) 12.5 MG tablet Take 1 tablet (12.5 mg total) by mouth 2 (two) times daily with a meal.  90 tablet  3  . Cholecalciferol (VITAMIN D PO) Take 3,000 mcg by mouth daily.      [name] eplerenone (INSPRA) 25 MG tablet Take 1 tablet (25 mg total) by mouth at bedtime.  90 tablet  3  . fluticasone (FLONASE) 50 MCG/ACT nasal spray Place 2 sprays into the nose 2 (two) times daily.  16 g  12  . furosemide (LASIX) 40 MG tablet Take 80 mg by mouth daily.      . hydroxypropyl methylcellulose (ISOPTO TEARS) 2.5 % ophthalmic solution Place 1 drop into both eyes daily as needed. For dry eyes      . ipratropium (ATROVENT) 0.06 % nasal spray Place 2 sprays into the nose at bedtime.  15 mL  6  . levocetirizine (XYZAL) 5 MG tablet Take 1 tablet (5 mg total) by mouth every evening.  30 tablet  10  . mexiletine (MEXITIL) 150 MG capsule Take 2 capsules (300 mg total) by mouth 2 (two) times daily.  360 capsule  3  . Multiple Vitamin (MULTIVITAMIN WITH MINERALS) TABS Take 1 tablet by mouth daily.      . nitroGLYCERIN (NITROSTAT) 0.4 MG SL tablet Place 1 tablet (0.4 mg total) under the tongue  every 5 (five) minutes as needed.  25 tablet  3  . pantoprazole (PROTONIX) 40 MG tablet       . potassium chloride SA (K-DUR,KLOR-CON) 20 MEQ tablet Take 1 tablet (20 mEq total) by mouth daily.  30 tablet  0  . valsartan (DIOVAN) 40 MG tablet Take 1 tablet (40 mg total) by mouth daily.  90 tablet  3  . VENTOLIN HFA 108 (90 BASE) MCG/ACT inhaler Inhale 2 puffs into the lungs as needed for shortness of breath.       . warfarin (COUMADIN) 5 MG tablet Take 2.5-5 mg by mouth every evening. [name], Sat, and Sunday  = 2.5 5 mg all other days       No current facility-administered medications for this visit.     Allergies  Allergen Reactions  . Sulfa Antibiotics Rash    BP 138/92  Pulse 64  Temp(Src) 96.8 F (36 C) (Temporal)  Resp 14  Ht _0  (1.803 m)  Wt 229 lb 12.8 oz (104.237 kg)  BMI 32.06 kg/m2  Dg Chest 2 View  09/25/2012   *RADIOLOGY REPORT*  Clinical Data: Facial numbness  CHEST - 2 VIEW  Comparison: 01/09/2012  Findings: The cardiac shadow is stable but mildly enlarged.  A defibrillator is again seen.  No focal infiltrate or sizable effusion is noted.  The osseous structures show no acute abnormality.  IMPRESSION: No acute abnormality noted.   Original Report Authenticated By: Inez Catalina, M.D.   Ct Head Wo Contrast  09/25/2012   CLINICAL DATA:  Right and right jaw numbness, ringing in ears, blurred vision  EXAM: CT HEAD WITHOUT CONTRAST  TECHNIQUE: Contiguous axial images were obtained from the base of the skull through the vertex without intravenous contrast.  COMPARISON:  01/14/2011  FINDINGS: No skull fracture is noted. Paranasal sinuses and mastoid air cells are unremarkable. No intracranial hemorrhage, mass effect or midline shift.  No acute cortical infarction. No mass lesion is noted on this unenhanced  scan. Stable mild cerebral atrophy.  IMPRESSION: No acute intracranial abnormality. Stable mild cerebral atrophy.   Electronically Signed   By: Lahoma Crocker   On: 09/25/2012  16:17     Review of Systems  Constitutional: Negative for fever, chills and diaphoresis.  HENT: Negative for ear discharge, facial swelling, mouth sores, nosebleeds, sore throat and trouble swallowing.   Eyes: Negative for photophobia, discharge and visual disturbance.  Respiratory: Negative for choking, chest tightness, shortness of breath and stridor.   Cardiovascular: Negative for chest pain and palpitations.  Gastrointestinal: Negative for nausea, vomiting, abdominal pain, diarrhea, constipation, blood in stool, abdominal distention, anal bleeding and rectal pain.  Endocrine: Negative for cold intolerance and heat intolerance.  Genitourinary: Negative for dysuria, urgency, difficulty urinating and testicular pain.  Musculoskeletal: Negative for arthralgias, back pain, gait problem and myalgias.  Skin: Negative for color change, pallor, rash and wound.  Allergic/Immunologic: Negative for environmental allergies and food allergies.  Neurological: Negative for dizziness, speech difficulty, weakness, numbness and headaches.  Hematological: Negative for adenopathy. Does not bruise/bleed easily.  Psychiatric/Behavioral: Negative for hallucinations, confusion and agitation.       Objective:   Physical Exam  Constitutional: He is oriented to person, place, and time. He appears well-developed and well-nourished. No distress.  HENT:  Head: Normocephalic.  Mouth/Throat: Oropharynx is clear and moist. No oropharyngeal exudate.  Eyes: Conjunctivae and EOM are normal. Pupils are equal, round, and reactive to light. No scleral icterus.  Neck: Normal range of motion. Neck supple. No tracheal deviation present.  Cardiovascular: Normal rate, regular rhythm and intact distal pulses.   Pulmonary/Chest: Effort normal and breath sounds normal. No respiratory distress.  Abdominal: Soft. He exhibits no distension. There is no tenderness. There is no CVA tenderness. A hernia is present. Hernia confirmed  positive in the ventral area. Hernia confirmed negative in the right inguinal area and confirmed negative in the left inguinal area.    Musculoskeletal: Normal range of motion. He exhibits no tenderness.  Lymphadenopathy:    He has no cervical adenopathy.       Right: No inguinal adenopathy present.       Left: No inguinal adenopathy present.  Neurological: He is alert and oriented to person, place, and time. No cranial nerve deficit. He exhibits normal muscle tone. Coordination normal.  Skin: Skin is warm and dry. No rash noted. He is not diaphoretic. No erythema. No pallor.  Psychiatric: He has a normal mood and affect. His behavior is normal. Judgment and thought content normal.       Assessment:     Small but symptomatic umbilical hernia in the setting of chronic constipation     Plan:     There is no easy answer for this, but my instinct says he would benefit from having the umbilical hernia repaired.  It is gone the larger.  It has become more consistently symptomatic.  It is borderline for just doing stitches only.  Given his obesity, I am worried about recurrence.  Therefore, I think a laparoscopic underlay repair with mesh would be helpful.  Should be a quick case, but would require general anesthesia  My biggest concern is his fair ejection fraction and need for chronic anticoagulation.  He is quite active with a good quality of life and pretty good exercise tolerance, so I think says he should be able to tolerate the operation relatively well.  I think it is essential that he get cardiac clearance done first.  I would need  to make sure it is safe to come off anticoagulation perioperatively, ?  does he need a Lovenox bridge, ? does he need a repeat cardiac evaluation, etc.  The anatomy & physiology of the abdominal wall was discussed.  The pathophysiology of hernias was discussed.  Natural history risks without surgery including progeressive enlargement, pain, incarceration &  strangulation was discussed.   Contributors to complications such as smoking, obesity, diabetes, prior surgery, etc were discussed.   I feel the risks of no intervention will lead to serious problems that outweigh the operative risks; therefore, I recommended surgery to reduce and repair the hernia.  I explained laparoscopic techniques with possible need for an open approach.  I noted the probable use of mesh to patch and/or buttress the hernia repair  Risks such as bleeding, infection, abscess, need for further treatment, heart attack, death, and other risks were discussed.  I noted a good likelihood this will help address the problem.   Goals of post-operative recovery were discussed as well.  Possibility that this will not correct all symptoms was explained.  I stressed the importance of low-impact activity, aggressive pain control, avoiding constipation, & not pushing through pain to minimize risk of post-operative chronic pain or injury. Possibility of reherniation especially with smoking, obesity, diabetes, immunosuppression, and other health conditions was discussed.  We will work to minimize complications.     An educational handout further explaining the pathology & treatment options was given as well.  Questions were answered.  The patient expresses understanding & wishes to proceed with surgery.

## 2012-10-21 NOTE — Patient Instructions (Signed)
See the Handout(s) we gave you.  Consider surgery to switch her bellybutton hernia Since this is becoming more painful/symptomatic.  Another option is close observation.  I would want clearance from cardiology concerning your low ejection fraction and chronic anticoagulation..  Please call our office at [phone] after you see your cardiologist if you wish to schedule surgery or if you have further questions / concerns.   Hernia A hernia occurs when an internal organ pushes out through a weak spot in the abdominal wall. Hernias most commonly occur in the groin and around the navel. Hernias often can be pushed back into place (reduced). Most hernias tend to get worse over time. Some abdominal hernias can get stuck in the opening (irreducible or incarcerated hernia) and cannot be reduced. An irreducible abdominal hernia which is tightly squeezed into the opening is at risk for impaired blood supply (strangulated hernia). A strangulated hernia is a medical emergency. Because of the risk for an irreducible or strangulated hernia, surgery may be recommended to repair a hernia. CAUSES   Heavy lifting.  Prolonged coughing.  Straining to have a bowel movement.  A cut (incision) made during an abdominal surgery. HOME CARE INSTRUCTIONS   Bed rest is not required. You may continue your normal activities.  Avoid lifting more than 10 pounds (4.5 kg) or straining.  Cough gently. If you are a smoker it is best to stop. Even the best hernia repair can break down with the continual strain of coughing. Even if you do not have your hernia repaired, a cough will continue to aggravate the problem.  Do not wear anything tight over your hernia. Do not try to keep it in with an outside bandage or truss. These can damage abdominal contents if they are trapped within the hernia sac.  Eat a normal diet.  Avoid constipation. Straining over long periods of time will increase hernia size and encourage breakdown of  repairs. If you cannot do this with diet alone, stool softeners may be used. SEEK IMMEDIATE MEDICAL CARE IF:   You have a fever.  You develop increasing abdominal pain.  You feel nauseous or vomit.  Your hernia is stuck outside the abdomen, looks discolored, feels hard, or is tender.  You have any changes in your bowel habits or in the hernia that are unusual for you.  You have increased pain or swelling around the hernia.  You cannot push the hernia back in place by applying gentle pressure while lying down. MAKE SURE YOU:   Understand these instructions.  Will watch your condition.  Will get help right away if you are not doing well or get worse. Document Released: 12/31/2004 Document Revised: 03/25/2011 Document Reviewed: 08/20/2007 [facility] Patient Information 2014 [place].  GETTING TO GOOD BOWEL HEALTH. Irregular bowel habits such as constipation and diarrhea can lead to many problems over time.  Having one soft bowel movement a day is the most important way to prevent further problems.  The anorectal canal is designed to handle stretching and feces to safely manage our ability to get rid of solid waste (feces, poop, stool) out of our body.  BUT, hard constipated stools can act like ripping concrete bricks and diarrhea can be a burning fire to this very sensitive area of our body, causing inflamed hemorrhoids, anal fissures, increasing risk is perirectal abscesses, abdominal pain/bloating, an making irritable bowel worse.     The goal: ONE SOFT BOWEL MOVEMENT A DAY!  To have soft, regular bowel movements:  Drink at least 8 tall glasses of water a day.     Take plenty of fiber.  Fiber is the undigested part of plant food that passes into the colon, acting s "natures broom" to encourage bowel motility and movement.  Fiber can absorb and hold large amounts of water. This results in a larger, bulkier stool, which is soft and easier to pass. Work gradually over several weeks  up to 6 servings a day of fiber (25g a day even more if needed) in the form of: o Vegetables -- Root (potatoes, carrots, turnips), leafy green (lettuce, salad greens, celery, spinach), or cooked high residue (cabbage, broccoli, etc) o Fruit -- Fresh (unpeeled skin & pulp), Dried (prunes, apricots, cherries, etc ),  or stewed ( applesauce)  o Whole grain breads, pasta, etc (whole wheat)  o Bran cereals    Bulking Agents -- This type of water-retaining fiber generally is easily obtained each day by one of the following:  o Psyllium bran -- The psyllium plant is remarkable because its ground seeds can retain so much water. This product is available as Metamucil, Konsyl, Effersyllium, Per Diem Fiber, or the less expensive generic preparation in drug and health food stores. Although labeled a laxative, it really is not a laxative.  o Methylcellulose -- This is another fiber derived from wood which also retains water. It is available as Citrucel. o Polyethylene Glycol - and "artificial" fiber commonly called Miralax or Glycolax.  It is helpful for people with gassy or bloated feelings with regular fiber o Flax Seed - a less gassy fiber than psyllium   No reading or other relaxing activity while on the toilet. If bowel movements take longer than 5 minutes, you are too constipated   AVOID CONSTIPATION.  High fiber and water intake usually takes care of this.  Sometimes a laxative is needed to stimulate more frequent bowel movements, but    Laxatives are not a good long-term solution as it can wear the colon out. o Osmotics (Milk of Magnesia, Fleets phosphosoda, Magnesium citrate, MiraLax, GoLytely) are safer than  o Stimulants (Senokot, Castor Oil, Dulcolax, Ex Lax)    o Do not take laxatives for more than 7days in a row.    IF SEVERELY CONSTIPATED, try a Bowel Retraining Program: o Do not use laxatives.  o Eat a diet high in roughage, such as bran cereals and leafy vegetables.  o Drink six (6) ounces of  prune or apricot juice each morning.  o Eat two (2) large servings of stewed fruit each day.  o Take one (1) heaping tablespoon of a psyllium-based bulking agent twice a day. Use sugar-free sweetener when possible to avoid excessive calories.  o Eat a normal breakfast.  o Set aside 15 minutes after breakfast to sit on the toilet, but do not strain to have a bowel movement.  o If you do not have a bowel movement by the third day, use an enema and repeat the above steps.    Controlling diarrhea o Switch to liquids and simpler foods for a few days to avoid stressing your intestines further. o Avoid dairy products (especially milk & ice cream) for a short time.  The intestines often can lose the ability to digest lactose when stressed. o Avoid foods that cause gassiness or bloating.  Typical foods include beans and other legumes, cabbage, broccoli, and dairy foods.  Every person has some sensitivity to other foods, so listen to our body and avoid those foods that  trigger problems for you. o Adding fiber (Citrucel, Metamucil, psyllium, Miralax) gradually can help thicken stools by absorbing excess fluid and retrain the intestines to act more normally.  Slowly increase the dose over a few weeks.  Too much fiber too soon can backfire and cause cramping & bloating. o Probiotics (such as active yogurt, Align, etc) may help repopulate the intestines and colon with normal bacteria and calm down a sensitive digestive tract.  Most studies show it to be of mild help, though, and such products can be costly. o Medicines:   Bismuth subsalicylate (ex. Kayopectate, Pepto Bismol) every 30 minutes for up to 6 doses can help control diarrhea.  Avoid if pregnant.   Loperamide (Immodium) can slow down diarrhea.  Start with two tablets (35m total) first and then try one tablet every 6 hours.  Avoid if you are having fevers or severe pain.  If you are not better or start feeling worse, stop all medicines and call your doctor  for advice o Call your doctor if you are getting worse or not better.  Sometimes further testing (cultures, endoscopy, X-ray studies, bloodwork, etc) may be needed to help diagnose and treat the cause of the diarrhea.  Constipation, Adult Constipation is when a person has fewer than 3 bowel movements a week; has difficulty having a bowel movement; or has stools that are dry, hard, or larger than normal. As people grow older, constipation is more common. If you try to fix constipation with medicines that make you have a bowel movement (laxatives), the problem may get worse. Long-term laxative use may cause the muscles of the colon to become weak. A low-fiber diet, not taking in enough fluids, and taking certain medicines may make constipation worse. CAUSES   Certain medicines, such as antidepressants, pain medicine, iron supplements, antacids, and water pills.   Certain diseases, such as diabetes, irritable bowel syndrome (IBS), thyroid disease, or depression.   Not drinking enough water.   Not eating enough fiber-rich foods.   Stress or travel.  Lack of physical activity or exercise.  Not going to the restroom when there is the urge to have a bowel movement.  Ignoring the urge to have a bowel movement.  Using laxatives too much. SYMPTOMS   Having fewer than 3 bowel movements a week.   Straining to have a bowel movement.   Having hard, dry, or larger than normal stools.   Feeling full or bloated.   Pain in the lower abdomen.  Not feeling relief after having a bowel movement. DIAGNOSIS  Your caregiver will take a medical history and perform a physical exam. Further testing may be done for severe constipation. Some tests may include:   A barium enema X-ray to examine your rectum, colon, and sometimes, your small intestine.  A sigmoidoscopy to examine your lower colon.  A colonoscopy to examine your entire colon. TREATMENT  Treatment will depend on the severity of  your constipation and what is causing it. Some dietary treatments include drinking more fluids and eating more fiber-rich foods. Lifestyle treatments may include regular exercise. If these diet and lifestyle recommendations do not help, your caregiver may recommend taking over-the-counter laxative medicines to help you have bowel movements. Prescription medicines may be prescribed if over-the-counter medicines do not work.  HOME CARE INSTRUCTIONS   Increase dietary fiber in your diet, such as fruits, vegetables, whole grains, and beans. Limit high-fat and processed sugars in your diet, such as [place]fries, hamburgers, cookies, candies, and soda.  A fiber supplement may be added to your diet if you cannot get enough fiber from foods.   Drink enough fluids to keep your urine clear or pale yellow.   Exercise regularly or as directed by your caregiver.   Go to the restroom when you have the urge to go. Do not hold it.  Only take medicines as directed by your caregiver. Do not take other medicines for constipation without talking to your caregiver first. [place] IF:   You have bright red blood in your stool.   Your constipation lasts for more than 4 days or gets worse.   You have abdominal or rectal pain.   You have thin, pencil-like stools.  You have unexplained weight loss. MAKE SURE YOU:   Understand these instructions.  Will watch your condition.  Will get help right away if you are not doing well or get worse. Document Released: 09/29/2003 Document Revised: 03/25/2011 Document Reviewed: 12/04/2010 [facility] Patient Information 2014 [place], [place].  Flatulence Burping releases air that you swallow. The bubbles from some drinks may cause burps. There are good germs in your gut to help you digest food. Gas is produced by these germs and released from your bottom. Most people release 3 to 4 quarts of gas every day. This is normal. HOME CARE  Eat or  drink less of the foods or liquids that give you gas.  Take the time to chew your food well. Talk less while you eat.  Do not suck on ice or hard candy.  Sip slowly. Stir some of the bubbles out of fizzy drinks with a spoon or straw.  Avoid chewing gum or smoking.  Ask your doctor about liquids and tablets that may help control burping and gas.  Only take medicine as told by your doctor. GET HELP RIGHT AWAY IF:   There is discomfort when you burp or pass gas.  You throw up (vomit) when you burp.  Poop (stool) comes out when you pass gas.  Your belly is puffy (swollen) and hard. MAKE SURE YOU:   Understand these instructions.  Will watch your condition.  Will get help right away if you are not doing well or get worse. Document Released: 11/03/2007 Document Revised: 03/25/2011 Document Reviewed: 11/03/2007 [facility] Patient Information 2014 [place], [place].  HERNIA REPAIR: POST OP INSTRUCTIONS  1. DIET: Follow a light bland diet the first 24 hours after arrival home, such as soup, liquids, crackers, etc.  Be sure to include lots of fluids daily.  Avoid fast food or heavy meals as your are more likely to get nauseated.  Eat a low fat the next few days after surgery. 2. Take your usually prescribed home medications unless otherwise directed. 3. PAIN CONTROL: a. Pain is best controlled by a usual combination of three different methods TOGETHER: i. Ice/Heat ii. Over the counter pain medication iii. Prescription pain medication b. Most patients will experience some swelling and bruising around the hernia(s) such as the bellybutton, groins, or old incisions.  Ice packs or heating pads (30-60 minutes up to 6 times a day) will help. Use ice for the first few days to help decrease swelling and bruising, then switch to heat to help relax tight/sore spots and speed recovery.  Some people prefer to use ice alone, heat alone, alternating between ice & heat.  Experiment to what works for you.   Swelling and bruising can take several weeks to resolve.   c. It is helpful to take an over-the-counter  pain medication regularly for the first few weeks.  Choose one of the following that works best for you: i. Naproxen (Aleve, etc)  Two 221m tabs twice a day ii. Ibuprofen (Advil, etc) Three 2023mtabs four times a day (every meal & bedtime) iii. Acetaminophen (Tylenol, etc) 325-65077mour times a day (every meal & bedtime) d. A  prescription for pain medication should be given to you upon discharge.  Take your pain medication as prescribed.  i. If you are having problems/concerns with the prescription medicine (does not control pain, nausea, vomiting, rash, itching, etc), please call us [phone] see if we need to switch you to a different pain medicine that will work better for you and/or control your side effect better. ii. If you need a refill on your pain medication, please contact your pharmacy.  They will contact our office to request authorization. Prescriptions will not be filled after 5 pm or on week-ends. 4. Avoid getting constipated.  Between the surgery and the pain medications, it is common to experience some constipation.  Increasing fluid intake and taking a fiber supplement (such as Metamucil, Citrucel, FiberCon, MiraLax, etc) 1-2 times a day regularly will usually help prevent this problem from occurring.  A mild laxative (prune juice, Milk of Magnesia, MiraLax, etc) should be taken according to package directions if there are no bowel movements after 48 hours.   5. Wash / shower every day.  You may shower over the dressings as they are waterproof.   6. Remove your waterproof bandages 5 days after surgery.  You may leave the incision open to air.  You may replace a dressing/Band-Aid to cover the incision for comfort if you wish.  Continue to shower over incision(s) after the dressing is off.    7. ACTIVITIES as tolerated:   a. You may resume regular (light) daily  activities beginning the next day-such as daily self-care, walking, climbing stairs-gradually increasing activities as tolerated.  If you can walk 30 minutes without difficulty, it is safe to try more intense activity such as jogging, treadmill, bicycling, low-impact aerobics, swimming, etc. b. Save the most intensive and strenuous activity for last such as sit-ups, heavy lifting, contact sports, etc  Refrain from any heavy lifting or straining until you are off narcotics for pain control.   c. DO NOT PUSH THROUGH PAIN.  Let pain be your guide: If it hurts to do something, don't do it.  Pain is your body warning you to avoid that activity for another week until the pain goes down. d. You may drive when you are no longer taking prescription pain medication, you can comfortably wear a seatbelt, and you can safely maneuver your car and apply brakes. e. YouDennis Basty have sexual intercourse when it is comfortable.  8. FOLLOW UP in our office a. Please call [facility] at [phone] to set up an appointment to see your surgeon in the office for a follow-up appointment approximately 2-3 weeks after your surgery. b. Make sure that you call for this appointment the day you arrive home to insure a convenient appointment time. 9.  IF YOU HAVE DISABILITY OR FAMILY LEAVE FORMS, BRING THEM TO THE OFFICE FOR PROCESSING.  DO NOT GIVE THEM TO YOUR DOCTOR.  WHEN TO CALL US [phone]. Poor pain control 2. Reactions / problems with new medications (rash/itching, nausea, etc)  3. Fever over 101.5 F (38.5 C) 4. Inability to urinate 5. Nausea and/or vomiting 6. Worsening swelling or bruising 7.  Continued bleeding from incision. 8. Increased pain, redness, or drainage from the incision   The clinic staff is available to answer your questions during regular business hours (8:30am-5pm).  Please don't hesitate to call and ask to speak to one of our nurses for clinical concerns.   If you have a medical emergency, go to the  nearest emergency room or call 911.  A surgeon from [facility] is always on call at the hospitals in [facility], [place], [place], [place], [place]  [address] ?  [address] [address], [place], [place]   [address] MAIN: [phone] ? TOLL FREE: [phone] ? FAX: [phone] [email or web address]

## 2012-10-22 ENCOUNTER — Ambulatory Visit (INDEPENDENT_AMBULATORY_CARE_PROVIDER_SITE_OTHER): Payer: Medicare Other | Admitting: *Deleted

## 2012-10-22 DIAGNOSIS — I4729 Other ventricular tachycardia: Secondary | ICD-10-CM

## 2012-10-22 DIAGNOSIS — I472 Ventricular tachycardia: Secondary | ICD-10-CM

## 2012-10-22 DIAGNOSIS — Z95810 Presence of automatic (implantable) cardiac defibrillator: Secondary | ICD-10-CM

## 2012-10-22 LAB — REMOTE ICD DEVICE
AL IMPEDENCE ICD: 541 Ohm
DEVICE MODEL ICD: 492371
HV IMPEDENCE: 48 Ohm
LV LEAD AMPLITUDE: 5.4 mv
LV LEAD IMPEDENCE ICD: 1051 Ohm
TZAT-0001FASTVT: 1
TZAT-0001SLOWVT: 1
TZAT-0013FASTVT: 2
TZAT-0013FASTVT: 2
TZAT-0013SLOWVT: 4
TZAT-0018FASTVT: NEGATIVE
TZON-0003SLOWVT: 600 ms
TZST-0001FASTVT: 3
TZST-0001FASTVT: 4
TZST-0001FASTVT: 6
TZST-0001SLOWVT: 3
TZST-0001SLOWVT: 6
TZST-0001SLOWVT: 7
TZST-0002SLOWVT: NEGATIVE
TZST-0002SLOWVT: NEGATIVE
TZST-0003FASTVT: 41 J
TZST-0003FASTVT: 41 J
VENTRICULAR PACING ICD: 99 pct

## 2012-10-26 ENCOUNTER — Telehealth (INDEPENDENT_AMBULATORY_CARE_PROVIDER_SITE_OTHER): Payer: Self-pay | Admitting: Surgery

## 2012-10-26 NOTE — Telephone Encounter (Signed)
Face sheet placed in pending awaiting cardiac clearance to schedule surgery

## 2012-11-04 ENCOUNTER — Encounter: Payer: Self-pay | Admitting: Internal Medicine

## 2012-11-04 ENCOUNTER — Ambulatory Visit (INDEPENDENT_AMBULATORY_CARE_PROVIDER_SITE_OTHER): Payer: Medicare Other | Admitting: *Deleted

## 2012-11-04 DIAGNOSIS — Z7901 Long term (current) use of anticoagulants: Secondary | ICD-10-CM

## 2012-11-04 DIAGNOSIS — I4891 Unspecified atrial fibrillation: Secondary | ICD-10-CM

## 2012-11-11 ENCOUNTER — Ambulatory Visit (INDEPENDENT_AMBULATORY_CARE_PROVIDER_SITE_OTHER): Payer: Medicare Other | Admitting: Family Medicine

## 2012-11-11 ENCOUNTER — Encounter: Payer: Self-pay | Admitting: Family Medicine

## 2012-11-11 VITALS — BP 130/82 | HR 59 | Temp 97.7°F | Resp 18 | Ht 71.0 in | Wt 230.0 lb

## 2012-11-11 DIAGNOSIS — I4891 Unspecified atrial fibrillation: Secondary | ICD-10-CM | POA: Diagnosis not present

## 2012-11-11 DIAGNOSIS — G459 Transient cerebral ischemic attack, unspecified: Secondary | ICD-10-CM | POA: Diagnosis not present

## 2012-11-11 DIAGNOSIS — E876 Hypokalemia: Secondary | ICD-10-CM | POA: Diagnosis not present

## 2012-11-11 DIAGNOSIS — I219 Acute myocardial infarction, unspecified: Secondary | ICD-10-CM

## 2012-11-11 DIAGNOSIS — I513 Intracardiac thrombosis, not elsewhere classified: Secondary | ICD-10-CM

## 2012-11-11 DIAGNOSIS — I5022 Chronic systolic (congestive) heart failure: Secondary | ICD-10-CM

## 2012-11-11 LAB — BASIC METABOLIC PANEL
CO2: 25 mEq/L (ref 19–32)
Calcium: 9.4 mg/dL (ref 8.4–10.5)
Chloride: 104 mEq/L (ref 96–112)
Creatinine, Ser: 1 mg/dL (ref 0.4–1.5)
Glucose, Bld: 90 mg/dL (ref 70–99)
Sodium: 138 mEq/L (ref 135–145)

## 2012-11-11 NOTE — Progress Notes (Addendum)
OFFICE NOTE  11/11/2012  CC:  Chief Complaint  Patient presents with  . Follow-up    also needs potassium / magnesium checked     HPI: Patient is a 72 y.o. Caucasian male who is here for chronic illness f/u, was recently in [place] 9/12-9/13, 2014 for TIA. Symptoms were numbness/tingling in right side of face and right arm which lasted about 1 hr.  Reviewed d/c summary. CT neg for acute changes, no MRI due to pt having pacemaker.  His INR was therapeutic on admission. Carotid dopplers w/out significant stenosis.  Mostly in sinus rhythm on telemetry.  Transthoracic echo result was pending at the time of d/c and this showed Potassium mildly low on admission at 3.4.  This was repleted.  Pertinent PMH:  Past Medical History  Diagnosis Date  . Atrial fibrillation     Ablation for a-flutter 05/2000  . Cardiomyopathy     Ischemic.  EF 20%  . Hypertension   . Hyperlipidemia   . Allergic rhinitis   . Obstructive sleep apnea     CPAP 16  . CAD (coronary artery disease)     PTCA RCA 1997 (Inf/post MI);  [name] Ventricular tachycardia     ICD, pacer  . Adenomatous colon polyp 2009; 06/2012    Colonoscopy 07/2007---Dr. Barron Schmid GI--Repeat 06/2012 showed one tubular adenoma w/out high grade dysplasia.  . Kidney stones     No distal stones on CT 2010  . Myocardial infarction   . Microscopic hematuria     Cysto neg 02/2009 except for BPH  . Diverticulosis of colon 2005    Noted on Colonoscopies in 2005 and 2009 and on CT 2010  . Internal hemorrhoids     Colonoscopy 07/2007  . Hepatic steatosis 2010    Noted on CT abd 2010 and on u/s abd 04/2009 (transaminasemia)  . Allergic rhinitis   . BPH (benign prostatic hypertrophy)     PSAs ok (followed by urologist)  . History of impaired glucose tolerance   . Shortness of breath   . CHF (congestive heart failure)   . Pacemaker     pacermaker /ICD/CRT---LV lead replacement 12/2012.  F/u echo showed mild improvement in mitral regurg but EF still  10%.  . Paronychia of right thumb 08/22/2012    MEDS:  Outpatient Prescriptions Prior to Visit  Medication Sig Dispense Refill  . amiodarone (PACERONE) 200 MG tablet Take 1 tablet (200 mg total) by mouth daily.  90 tablet  3  . carvedilol (COREG) 12.5 MG tablet Take 1 tablet (12.5 mg total) by mouth 2 (two) times daily with a meal.  90 tablet  3  . Cholecalciferol (VITAMIN D PO) Take 3,000 mcg by mouth daily.      [name] eplerenone (INSPRA) 25 MG tablet Take 1 tablet (25 mg total) by mouth at bedtime.  90 tablet  3  . furosemide (LASIX) 40 MG tablet Take 80 mg by mouth daily.      [name] ipratropium (ATROVENT) 0.06 % nasal spray Place 2 sprays into the nose at bedtime.  15 mL  6  . levocetirizine (XYZAL) 5 MG tablet Take 1 tablet (5 mg total) by mouth every evening.  30 tablet  10  . mexiletine (MEXITIL) 150 MG capsule Take 2 capsules (300 mg total) by mouth 2 (two) times daily.  360 capsule  3  . Multiple Vitamin (MULTIVITAMIN WITH MINERALS) TABS Take 1 tablet by mouth daily.      . nitroGLYCERIN (NITROSTAT) 0.4 MG SL  tablet Place 1 tablet (0.4 mg total) under the tongue every 5 (five) minutes as needed.  25 tablet  3  . pantoprazole (PROTONIX) 40 MG tablet       . potassium chloride SA (K-DUR,KLOR-CON) 20 MEQ tablet Take 1 tablet (20 mEq total) by mouth daily.  30 tablet  0  . VENTOLIN HFA 108 (90 BASE) MCG/ACT inhaler Inhale 2 puffs into the lungs as needed for shortness of breath.       . warfarin (COUMADIN) 5 MG tablet Take 2.5-5 mg by mouth every evening. [name], Sat, and Sunday  = 2.5 5 mg all other days      . fluticasone (FLONASE) 50 MCG/ACT nasal spray Place 2 sprays into the nose 2 (two) times daily.  16 g  12  . hydroxypropyl methylcellulose (ISOPTO TEARS) 2.5 % ophthalmic solution Place 1 drop into both eyes daily as needed. For dry eyes      . valsartan (DIOVAN) 40 MG tablet Take 1 tablet (40 mg total) by mouth daily.  90 tablet  3   No facility-administered medications prior to visit.     PE: Blood pressure 130/82, pulse 59, temperature 97.7 F (36.5 C), temperature source Temporal, resp. rate 18, height _0  (1.803 m), weight 230 lb (104.327 kg), SpO2 59.00%. Gen: Alert, well appearing.  Patient is oriented to person, place, time, and situation. AFFECT: pleasant, lucid thought and speech. CV: RRR, distant S1 and S2, 2/6 syst murmur Chest is clear, no wheezing or rales. Normal symmetric air entry throughout both lung fields. No chest wall deformities or tenderness. Neuro: CN 2-12 intact bilaterally, strength 5/5 in proximal and distal upper extremities and lower extremities bilaterally.  No sensory deficits.  No tremor.  No disdiadochokinesis.  No ataxia.  Upper extremity and lower extremity DTRs symmetric.  No pronator drift.  LAB: none today  ECHO result from 09/26/12:  Study Conclusions - Left ventricle: The cavity size was severely dilated. Wall thickness was normal. Systolic function was severely reduced. The estimated ejection fraction was in the range of 20% to 25%. Diffuse hypokinesis. There is akinesis of the mid-distalanteroseptal myocardium. There is akinesis of the entireinferior myocardium. There was an increased relative contribution of atrial contraction to ventricular filling. Doppler parameters are consistent with abnormal left ventricular relaxation (grade 1 diastolic dysfunction). Cannot exclude apical thrombus. - Ventricular septum: Septal motion showed severe paradox. These changes are consistent with right ventricular pacing. - Mitral valve: Mild to moderate regurgitation. - Left atrium: The atrium was mildly dilated. - Atrial septum: No defect or patent foramen ovale was identified.    IMPRESSION AND PLAN:  TIA (transient ischemic attack) No recurrence. Possible alternative dx of transient neurologic dysfunction associated with hypokalemia. Recheck BMET + Mg today.  Additionally, will contact Dr. Caryl Comes to make him aware of pt's  transthoracic echo result from the hospitalization recently ("cannot exclude apical thrombus") and see if he has any additional recommendations since the patient is already anticoagulated with coumadin.  SYSTOLIC HEART FAILURE, CHRONIC Recent echo showing stable EF. Continue current cardiac meds and appropriate f/u with Dr. Caryl Comes and Dr. Stanford Breed.   An After Visit Summary was printed and given to the patient.  Spent 25 min with pt today, with >50% of this time spent in counseling and care coordination regarding the above problems.  FOLLOW UP: 4 mo  Addendum 11/11/12: talked to Dr. Caryl Comes and he said nothing further to do regarding echo results since pt is already anticoagulated.--PM

## 2012-11-17 NOTE — Assessment & Plan Note (Signed)
Recent echo showing stable EF. Continue current cardiac meds and appropriate f/u with Dr. Caryl Comes and Dr. Stanford Breed.

## 2012-11-17 NOTE — Assessment & Plan Note (Signed)
No recurrence. Possible alternative dx of transient neurologic dysfunction associated with hypokalemia. Recheck BMET + Mg today.  Additionally, will contact Dr. Caryl Comes to make him aware of pt's transthoracic echo result from the hospitalization recently ("cannot exclude apical thrombus") and see if he has any additional recommendations since the patient is already anticoagulated with coumadin.

## 2012-11-20 ENCOUNTER — Encounter: Payer: Self-pay | Admitting: Internal Medicine

## 2012-11-25 ENCOUNTER — Telehealth (INDEPENDENT_AMBULATORY_CARE_PROVIDER_SITE_OTHER): Payer: Self-pay

## 2012-11-25 NOTE — Telephone Encounter (Signed)
Called to see if we could get a verbal clearance on the pt stopping the Warafin so the pt can at least talk to the surgery schedulers about a surgery date. The pt has an appt on 12/24/12 with Dr Caryl Comes but the pt states that Dr Stanford Breed is the one monitoring the [facility]. The pt has tried to move the appt up for the clearance but there is nothing earlier. I advised pt that I would try to get a verbal on the Warafin so we could at least get pt scheduled. The office told me to refax the request to [phone].

## 2012-12-02 ENCOUNTER — Ambulatory Visit (INDEPENDENT_AMBULATORY_CARE_PROVIDER_SITE_OTHER): Payer: Medicare Other | Admitting: *Deleted

## 2012-12-02 DIAGNOSIS — Z7901 Long term (current) use of anticoagulants: Secondary | ICD-10-CM

## 2012-12-02 DIAGNOSIS — I4891 Unspecified atrial fibrillation: Secondary | ICD-10-CM

## 2012-12-04 ENCOUNTER — Telehealth: Payer: Self-pay | Admitting: Cardiology

## 2012-12-04 NOTE — Telephone Encounter (Signed)
New message  Elmo Putt with Dr Clyda Greener office needs clearance for patient to come off of warfin for 5 days for surgery. Hernia repair, please call with verbal auth.

## 2012-12-04 NOTE — Telephone Encounter (Signed)
Ok to dc coumadin 5 days prior to procedure and resume day of. Kirk Ruths

## 2012-12-04 NOTE — Telephone Encounter (Signed)
Called to check on the verbal order on pt stopping the Warafin 5 days before surgery. The pt has an appt with Dr Caryl Comes on 12/24/12 but was wanting a verbal on the [facility] so he can get a surgery date with Dr Johney Maine. The pt can't get a surgery date till he gets cardiac clearance and instructions on stopping the Warafin. They will send a note to Dr Stanford Breed today.

## 2012-12-04 NOTE — Telephone Encounter (Signed)
Will forward for dr Stanford Breed review

## 2012-12-04 NOTE — Telephone Encounter (Signed)
Will forward to alicia spillers at [employer]

## 2012-12-07 NOTE — Telephone Encounter (Signed)
Called pt to notify him that we received clearance from Dr Stanford Breed on pt stopping the Coumadin 5 days before his surgery. The pt still has an appt with Dr Caryl Comes for 12/24/12 so the pt will call to schedule his surgery for the beginning of the year once he has the appt with Dr Caryl Comes. I advised him that I will turn his surgical orders into surgery scheduling so it will be up to him to call our schedulers when he is ready to schedule. The pt understands and will call back to schedule surgery.

## 2012-12-24 ENCOUNTER — Ambulatory Visit (INDEPENDENT_AMBULATORY_CARE_PROVIDER_SITE_OTHER): Payer: Medicare Other | Admitting: Internal Medicine

## 2012-12-24 ENCOUNTER — Ambulatory Visit (INDEPENDENT_AMBULATORY_CARE_PROVIDER_SITE_OTHER): Payer: Medicare Other | Admitting: Pharmacist

## 2012-12-24 ENCOUNTER — Encounter: Payer: Self-pay | Admitting: Internal Medicine

## 2012-12-24 VITALS — BP 136/87 | HR 63 | Ht 70.0 in | Wt 231.0 lb

## 2012-12-24 DIAGNOSIS — I5022 Chronic systolic (congestive) heart failure: Secondary | ICD-10-CM

## 2012-12-24 DIAGNOSIS — R7401 Elevation of levels of liver transaminase levels: Secondary | ICD-10-CM

## 2012-12-24 DIAGNOSIS — I4729 Other ventricular tachycardia: Secondary | ICD-10-CM | POA: Diagnosis not present

## 2012-12-24 DIAGNOSIS — Z95810 Presence of automatic (implantable) cardiac defibrillator: Secondary | ICD-10-CM | POA: Diagnosis not present

## 2012-12-24 DIAGNOSIS — I509 Heart failure, unspecified: Secondary | ICD-10-CM | POA: Diagnosis not present

## 2012-12-24 DIAGNOSIS — I472 Ventricular tachycardia, unspecified: Secondary | ICD-10-CM

## 2012-12-24 DIAGNOSIS — I4891 Unspecified atrial fibrillation: Secondary | ICD-10-CM

## 2012-12-24 DIAGNOSIS — I2589 Other forms of chronic ischemic heart disease: Secondary | ICD-10-CM | POA: Diagnosis not present

## 2012-12-24 DIAGNOSIS — I428 Other cardiomyopathies: Secondary | ICD-10-CM | POA: Diagnosis not present

## 2012-12-24 DIAGNOSIS — Z7901 Long term (current) use of anticoagulants: Secondary | ICD-10-CM

## 2012-12-24 DIAGNOSIS — I429 Cardiomyopathy, unspecified: Secondary | ICD-10-CM

## 2012-12-24 LAB — MDC_IDC_ENUM_SESS_TYPE_INCLINIC
HighPow Impedance: 54 Ohm
Implantable Pulse Generator Serial Number: 492371
Lead Channel Impedance Value: 1110 Ohm
Lead Channel Impedance Value: 568 Ohm
Lead Channel Impedance Value: 742 Ohm
Lead Channel Pacing Threshold Amplitude: 1.1 V
Lead Channel Pacing Threshold Amplitude: 1.4 V
Lead Channel Pacing Threshold Pulse Width: 0.4 ms
Lead Channel Pacing Threshold Pulse Width: 0.5 ms
Lead Channel Sensing Intrinsic Amplitude: 5 mV
Lead Channel Sensing Intrinsic Amplitude: 5.9 mV
Lead Channel Sensing Intrinsic Amplitude: 6.8 mV
Lead Channel Setting Pacing Amplitude: 2.5 V
Lead Channel Setting Pacing Pulse Width: 0.4 ms
Lead Channel Setting Sensing Sensitivity: 1 mV
Zone Setting Detection Interval: 333 ms

## 2012-12-24 LAB — HEPATIC FUNCTION PANEL
AST: 41 U/L — ABNORMAL HIGH (ref 0–37)
Albumin: 4.1 g/dL (ref 3.5–5.2)
Bilirubin, Direct: 0.1 mg/dL (ref 0.0–0.3)

## 2012-12-24 LAB — POCT INR: INR: 3.5

## 2012-12-24 NOTE — Assessment & Plan Note (Signed)
  As above.

## 2012-12-24 NOTE — Assessment & Plan Note (Signed)
No intercurreent afib Continue current medications

## 2012-12-24 NOTE — Assessment & Plan Note (Signed)
repeaat labs pendng

## 2012-12-24 NOTE — Assessment & Plan Note (Signed)
And he is euvolemic. He still continues to complain of fatigue. Reprogrammed sensor a little more aggressively we'll undertake an AV optimization echo

## 2012-12-24 NOTE — Progress Notes (Signed)
Patient Care Team: [name], MD as PCP - General (Family Medicine) Fredricka Bonine, MD as Consulting Physician (Urology) Kathee Delton, MD as Consulting Physician (Pulmonary Disease) Jeryl Columbia, MD as Consulting Physician (Gastroenterology) Candee Furbish, MD as Consulting Physician (Cardiology) Adin Hector, MD as Consulting Physician (General Surgery)   HPI  [name] is a 72 y.o. male Seen in followup for congestive heart failure in the setting of ischemic heart disease and previously implanted CRT-D with subsequent left ventricular lead macro dislodgment. His symptoms however been quite stable. When he saw Dr. Lovena Le concerning lead extraction and lead implantation he was doing as well as he had for some time and it was decided not to pursue it then.  He also has ventricular tachycardia for which he takes amiodarone. LFTs were abnormal and his Crestor was stopped. Repeat LFTs remained elevated when they were last checked in August.  Last cardiac catheterization in October 2008 showed an occluded right coronary artery with left-to-right collateralization. Ejection fraction is 20%. There was no other coronary disease noted. Last Myoview in June of 2010 showed prior inferior infarct but no ischemia. His ejection fraction was 21%previous carotid Dopplers in April 2006 showed 0-39% stenosis bilaterally. Previous abdominal ultrasound in April 2006 showed no aneurysm.  Because of symptoms of congestive heart failure he underwent CRT upgrade by Dr. Elliot Cousin with subsequent extraction of the previously implanted failed LV lead.   He does not think that he is any better following the revision although my last note said that he felt better following the revision.??.  He has not had chest pain. It is not have edema. He does have ongoing issues of fatigue and shortness of breath the former of which were mildly ameliorated by himself down titration of his valsartan    Past  Medical History  Diagnosis Date  . Atrial fibrillation     Ablation for a-flutter 05/2000  . Cardiomyopathy     Ischemic.  EF 20%  . Hypertension   . Hyperlipidemia   . Allergic rhinitis   . Obstructive sleep apnea     CPAP 16  . CAD (coronary artery disease)     PTCA RCA 1997 (Inf/post MI);  [name] Ventricular tachycardia     ICD, pacer  . Adenomatous colon polyp 2009; 06/2012    Colonoscopy 07/2007---Dr. Barron Schmid GI--Repeat 06/2012 showed one tubular adenoma w/out high grade dysplasia.  . Kidney stones     No distal stones on CT 2010  . Myocardial infarction   . Microscopic hematuria     Cysto neg 02/2009 except for BPH  . Diverticulosis of colon 2005    Noted on Colonoscopies in 2005 and 2009 and on CT 2010  . Internal hemorrhoids     Colonoscopy 07/2007  . Hepatic steatosis 2010    Noted on CT abd 2010 and on u/s abd 04/2009 (transaminasemia)  . Allergic rhinitis   . BPH (benign prostatic hypertrophy)     PSAs ok (followed by urologist)  . History of impaired glucose tolerance   . Shortness of breath   . CHF (congestive heart failure)   . Pacemaker     pacermaker /ICD/CRT---LV lead replacement 12/2012.  F/u echo showed mild improvement in mitral regurg but EF still 10%.  . Paronychia of right thumb 08/22/2012    Past Surgical History  Procedure Laterality Date  . Icd      a) Guidant Contak [id number]- Virl Axe, MD 07/25/06 (  second device) b) Medtronic CRT-D  . Breast surgery  1990    "Tissue removed from breast"  . Umbilical hernia repair    . Hernia repair  1975    bilateral inguinal  . Extracorporeal shock wave lithotripsy    . Atrial ablation surgery    . Cardiac catheterization    . Esophagogastroduodenoscopy N/A 07/13/2012    Procedure: ESOPHAGOGASTRODUODENOSCOPY (EGD);  Surgeon: Jeryl Columbia, MD;  Location: [facility];  Service: Endoscopy;  Laterality: N/A;  NORMAL  . Colonoscopy N/A 07/13/2012    Procedure: COLONOSCOPY;  Surgeon: Jeryl Columbia, MD;  Location: [facility]  ENDOSCOPY;  Service: Endoscopy;  Laterality: N/A; polypectomy x 1   . Carotid doppler  09/25/12    No signif stenosis    Current Outpatient Prescriptions  Medication Sig Dispense Refill  . amiodarone (PACERONE) 200 MG tablet Take 1 tablet (200 mg total) by mouth daily.  90 tablet  3  . carvedilol (COREG) 12.5 MG tablet Take 1 tablet (12.5 mg total) by mouth 2 (two) times daily with a meal.  90 tablet  3  . Cholecalciferol (VITAMIN D PO) Take 3,000 mcg by mouth daily.      [name] eplerenone (INSPRA) 25 MG tablet Take 1 tablet (25 mg total) by mouth at bedtime.  90 tablet  3  . furosemide (LASIX) 40 MG tablet Take 80 mg by mouth daily.      . hydroxypropyl methylcellulose (ISOPTO TEARS) 2.5 % ophthalmic solution Place 1 drop into both eyes daily as needed. For dry eyes      . ipratropium (ATROVENT) 0.06 % nasal spray Place 2 sprays into the nose at bedtime.  15 mL  6  . levocetirizine (XYZAL) 5 MG tablet Take 1 tablet (5 mg total) by mouth every evening.  30 tablet  10  . mexiletine (MEXITIL) 150 MG capsule Take 2 capsules (300 mg total) by mouth 2 (two) times daily.  360 capsule  3  . Multiple Vitamin (MULTIVITAMIN WITH MINERALS) TABS Take 1 tablet by mouth daily.      . nitroGLYCERIN (NITROSTAT) 0.4 MG SL tablet Place 1 tablet (0.4 mg total) under the tongue every 5 (five) minutes as needed.  25 tablet  3  . pantoprazole (PROTONIX) 40 MG tablet 2 (two) times daily.       . potassium chloride SA (K-DUR,KLOR-CON) 20 MEQ tablet Take 1 tablet (20 mEq total) by mouth daily.  30 tablet  0  . valsartan (DIOVAN) 40 MG tablet Take 1 tablet (40 mg total) by mouth daily.  90 tablet  3  . VENTOLIN HFA 108 (90 BASE) MCG/ACT inhaler Inhale 2 puffs into the lungs as needed for shortness of breath.       . warfarin (COUMADIN) 5 MG tablet Take 2.5-5 mg by mouth every evening. [name], Sat, and Sunday  = 2.5 5 mg all other days      . fluticasone (FLONASE) 50 MCG/ACT nasal spray Place 2 sprays into the nose 2  (two) times daily.  16 g  12   No current facility-administered medications for this visit.    Allergies  Allergen Reactions  . Sulfa Antibiotics Rash    Review of Systems negative except from HPI and PMH  Physical Exam BP 136/87  Pulse 63  Ht _0  (1.778 m)  Wt 231 lb (104.781 kg)  BMI 33.15 kg/m2 Well developed and well nourished in no acute distress HENT normal E scleral and icterus clear Neck Supple JVP flat;  carotids brisk and full Clear to ausculation  Device pocket well healed; without hematoma or erythema.  There is no tethering Regular rate and rhythm, no murmurs gallops or rub Soft with active bowel sounds No clubbing cyanosis none  Edema Alert and oriented, grossly normal motor and sensory function Skin Warm and Dry    Assessment and  Plan

## 2012-12-24 NOTE — Assessment & Plan Note (Signed)
The ventricular tachycardia. We'll continue amiodarone and mexiletine in current doses

## 2012-12-24 NOTE — Patient Instructions (Signed)
Your physician recommends that you return for lab work today: LFT/TSH.  Your physician has requested that you have an echocardiogram. Echocardiography is a painless test that uses sound waves to create images of your heart. It provides your doctor with information about the size and shape of your heart and how well your heart's chambers and valves are working. This procedure takes approximately one hour. There are no restrictions for this procedure.  Your physician recommends that you schedule a follow-up appointment in: 3 months with Dr. Stanford Breed.  Your physician wants you to follow-up in: 1 year with Ileene Hutchinson, PAC .  You will receive a reminder letter in the mail two months in advance. If you don't receive a letter, please call our office to schedule the follow-up appointment.

## 2012-12-24 NOTE — Assessment & Plan Note (Signed)
The patient's device was interrogated and the information was fully reviewed.  The device was reprogrammed As above

## 2012-12-30 ENCOUNTER — Other Ambulatory Visit: Payer: Self-pay | Admitting: *Deleted

## 2012-12-30 DIAGNOSIS — R7989 Other specified abnormal findings of blood chemistry: Secondary | ICD-10-CM

## 2012-12-30 DIAGNOSIS — R945 Abnormal results of liver function studies: Secondary | ICD-10-CM

## 2013-01-03 ENCOUNTER — Other Ambulatory Visit: Payer: Self-pay | Admitting: Pulmonary Disease

## 2013-01-04 ENCOUNTER — Encounter: Payer: Self-pay | Admitting: Internal Medicine

## 2013-01-13 ENCOUNTER — Other Ambulatory Visit: Payer: Self-pay | Admitting: Cardiology

## 2013-01-13 ENCOUNTER — Other Ambulatory Visit (HOSPITAL_COMMUNITY): Payer: Medicare Other

## 2013-01-15 ENCOUNTER — Ambulatory Visit (INDEPENDENT_AMBULATORY_CARE_PROVIDER_SITE_OTHER): Payer: Medicare Other | Admitting: Pharmacist

## 2013-01-15 DIAGNOSIS — Z7901 Long term (current) use of anticoagulants: Secondary | ICD-10-CM | POA: Diagnosis not present

## 2013-01-15 DIAGNOSIS — I4891 Unspecified atrial fibrillation: Secondary | ICD-10-CM | POA: Diagnosis not present

## 2013-01-15 LAB — POCT INR: INR: 2.7

## 2013-01-19 ENCOUNTER — Encounter: Payer: Self-pay | Admitting: Family Medicine

## 2013-01-19 ENCOUNTER — Ambulatory Visit (INDEPENDENT_AMBULATORY_CARE_PROVIDER_SITE_OTHER): Payer: Medicare Other | Admitting: Family Medicine

## 2013-01-19 VITALS — BP 108/75 | HR 60 | Temp 97.0°F | Resp 18 | Ht 71.0 in | Wt 232.0 lb

## 2013-01-19 DIAGNOSIS — L03012 Cellulitis of left finger: Secondary | ICD-10-CM

## 2013-01-19 DIAGNOSIS — L03019 Cellulitis of unspecified finger: Secondary | ICD-10-CM | POA: Diagnosis not present

## 2013-01-19 MED ORDER — CEPHALEXIN 500 MG PO CAPS: 500.0000 mg | ORAL_CAPSULE | Freq: Three times a day (TID) | ORAL | Status: DC

## 2013-01-19 NOTE — Progress Notes (Signed)
OFFICE NOTE  01/19/2013  CC:  Chief Complaint  Patient presents with  . Hand Pain    left hand finger      HPI: Patient is a 73 y.o. Caucasian male who is here for finger pain. Left index finger tip near medial border of nail is red and a little swollen and painful x 12-24h. No fever.  Has felt mild malaise for the last 3-4d.  No injury to the finger, no excessive moisture around finger lately. +hx of paronychium on right thumb.  Pertinent PMH:  Past Medical History  Diagnosis Date  . Atrial fibrillation     Ablation for a-flutter 05/2000  . Cardiomyopathy     Ischemic.  EF 20%  . Hypertension   . Hyperlipidemia   . Allergic rhinitis   . Obstructive sleep apnea     CPAP 16  . CAD (coronary artery disease)     PTCA RCA 1997 (Inf/post MI);  [name] Ventricular tachycardia     ICD, pacer  . Adenomatous colon polyp 2009; 06/2012    Colonoscopy 07/2007---Dr. Barron Schmid GI--Repeat 06/2012 showed one tubular adenoma w/out high grade dysplasia.  . Kidney stones     No distal stones on CT 2010  . Myocardial infarction   . Microscopic hematuria     Cysto neg 02/2009 except for BPH  . Diverticulosis of colon 2005    Noted on Colonoscopies in 2005 and 2009 and on CT 2010  . Internal hemorrhoids     Colonoscopy 07/2007  . Hepatic steatosis 2010    Noted on CT abd 2010 and on u/s abd 04/2009 (transaminasemia)  . Allergic rhinitis   . BPH (benign prostatic hypertrophy)     PSAs ok (followed by urologist)  . History of impaired glucose tolerance   . Shortness of breath   . CHF (congestive heart failure)   . Pacemaker     pacermaker /ICD/CRT---LV lead replacement 12/2012.  F/u echo showed mild improvement in mitral regurg but EF still 10%.  . Paronychia of right thumb 08/22/2012    MEDS:  Outpatient Prescriptions Prior to Visit  Medication Sig Dispense Refill  . amiodarone (PACERONE) 200 MG tablet Take 1 tablet (200 mg total) by mouth daily.  90 tablet  3  . carvedilol (COREG) 12.5 MG  tablet Take 1 tablet (12.5 mg total) by mouth 2 (two) times daily with a meal.  90 tablet  3  . Cholecalciferol (VITAMIN D PO) Take 3,000 mcg by mouth daily.      [name] eplerenone (INSPRA) 25 MG tablet Take 1 tablet (25 mg total) by mouth at bedtime.  90 tablet  3  . furosemide (LASIX) 40 MG tablet Take 80 mg by mouth daily.      . hydroxypropyl methylcellulose (ISOPTO TEARS) 2.5 % ophthalmic solution Place 1 drop into both eyes daily as needed. For dry eyes      . ipratropium (ATROVENT) 0.06 % nasal spray Place 2 sprays into the nose at bedtime.  15 mL  6  . mexiletine (MEXITIL) 150 MG capsule Take 2 capsules (300 mg total) by mouth 2 (two) times daily.  360 capsule  3  . Multiple Vitamin (MULTIVITAMIN WITH MINERALS) TABS Take 1 tablet by mouth daily.      . nitroGLYCERIN (NITROSTAT) 0.4 MG SL tablet Place 1 tablet (0.4 mg total) under the tongue every 5 (five) minutes as needed.  25 tablet  3  . pantoprazole (PROTONIX) 40 MG tablet 2 (two) times daily.       [name]  potassium chloride SA (K-DUR,KLOR-CON) 20 MEQ tablet Take 1 tablet (20 mEq total) by mouth daily.  30 tablet  0  . valsartan (DIOVAN) 40 MG tablet Take 1 tablet (40 mg total) by mouth daily.  90 tablet  3  . warfarin (COUMADIN) 5 MG tablet TAKE AS DIRECTED BY ANTICOAGULATION CLINIC  90 tablet  1  . fluticasone (FLONASE) 50 MCG/ACT nasal spray Place 2 sprays into the nose 2 (two) times daily.  16 g  12  . ipratropium (ATROVENT) 0.06 % nasal spray USE 2 SPRAYS IN EACH NOSTRIL 4 TIMES A DAY  15 mL  6  . levocetirizine (XYZAL) 5 MG tablet Take 1 tablet (5 mg total) by mouth every evening.  30 tablet  10  . VENTOLIN HFA 108 (90 BASE) MCG/ACT inhaler Inhale 2 puffs into the lungs as needed for shortness of breath.        No facility-administered medications prior to visit.    PE: Blood pressure 108/75, pulse 60, temperature 97 F (36.1 C), temperature source Temporal, resp. rate 18, height _0  (1.803 m), weight 232 lb (105.235 kg), SpO2  96.00%. Gen: Alert, well appearing.  Patient is oriented to person, place, time, and situation. Left index finger with erythema, mild swelling, and tenderness focally along medial nail border.  No pus exiting under skin.   No streaking or extension of erythema up finger or hand/wrist.    IMPRESSION AND PLAN: Left index finger paronychia. After infiltration of area with 1/2 cc of 1% lidocaine w/out epi, I used #11 scalpel to incise 3 mm incision next to nail, expressed small amount of purulent fluid.  Bleeding minimal.  Pressure dressing applied.  Pt tolerated procedure well. Keflex 500 tid x 10d. Bactroban ointment tid x 10d. Signs/symptoms to call or return for were reviewed and pt expressed understanding.  An After Visit Summary was printed and given to the patient.  FOLLOW UP: prn

## 2013-01-19 NOTE — Progress Notes (Signed)
Pre visit review using our clinic review tool, if applicable. No additional management support is needed unless otherwise documented below in the visit note.

## 2013-01-22 ENCOUNTER — Other Ambulatory Visit: Payer: Self-pay | Admitting: Emergency Medicine

## 2013-01-25 ENCOUNTER — Ambulatory Visit (INDEPENDENT_AMBULATORY_CARE_PROVIDER_SITE_OTHER): Payer: Medicare Other | Admitting: Family Medicine

## 2013-01-25 ENCOUNTER — Encounter: Payer: Self-pay | Admitting: Family Medicine

## 2013-01-25 VITALS — BP 118/75 | HR 64 | Temp 97.6°F | Resp 18 | Ht 71.0 in | Wt 227.0 lb

## 2013-01-25 DIAGNOSIS — J069 Acute upper respiratory infection, unspecified: Secondary | ICD-10-CM

## 2013-01-25 NOTE — Progress Notes (Signed)
Pre visit review using our clinic review tool, if applicable. No additional management support is needed unless otherwise documented below in the visit note.

## 2013-01-25 NOTE — Progress Notes (Signed)
OFFICE NOTE  01/25/2013  CC:  Chief Complaint  Patient presents with  . URI  . Cough     HPI: Patient is a 73 y.o. Caucasian male who is here for respiratory complaints.   Left index finger paronychia is much improved. Onset 4-5 days ago of nasal congestion, runny nose, sinus pressure, PND, cough.   Has not had to use his albuterol inhaler for SOB or wheezing.  No fever or malaise. Feels a little better today than in previous few days.  Appetite back up to normal today.  Pertinent PMH:  Past Medical History  Diagnosis Date  . Atrial fibrillation     Ablation for a-flutter 05/2000  . Cardiomyopathy     Ischemic.  EF 20%  . Hypertension   . Hyperlipidemia   . Allergic rhinitis   . Obstructive sleep apnea     CPAP 16  . CAD (coronary artery disease)     PTCA RCA 1997 (Inf/post MI);  [name] Ventricular tachycardia     ICD, pacer  . Adenomatous colon polyp 2009; 06/2012    Colonoscopy 07/2007---Dr. Barron Schmid GI--Repeat 06/2012 showed one tubular adenoma w/out high grade dysplasia.  . Kidney stones     No distal stones on CT 2010  . Myocardial infarction   . Microscopic hematuria     Cysto neg 02/2009 except for BPH  . Diverticulosis of colon 2005    Noted on Colonoscopies in 2005 and 2009 and on CT 2010  . Internal hemorrhoids     Colonoscopy 07/2007  . Hepatic steatosis 2010    Noted on CT abd 2010 and on u/s abd 04/2009 (transaminasemia)  . Allergic rhinitis   . BPH (benign prostatic hypertrophy)     PSAs ok (followed by urologist)  . History of impaired glucose tolerance   . Shortness of breath   . CHF (congestive heart failure)   . Pacemaker     pacermaker /ICD/CRT---LV lead replacement 12/2012.  F/u echo showed mild improvement in mitral regurg but EF still 10%.  . Paronychia of right thumb 08/22/2012    MEDS:  Outpatient Prescriptions Prior to Visit  Medication Sig Dispense Refill  . amiodarone (PACERONE) 200 MG tablet Take 1 tablet (200 mg total) by mouth daily.   90 tablet  3  . carvedilol (COREG) 12.5 MG tablet Take 1 tablet (12.5 mg total) by mouth 2 (two) times daily with a meal.  90 tablet  3  . cephALEXin (KEFLEX) 500 MG capsule Take 1 capsule (500 mg total) by mouth 3 (three) times daily.  30 capsule  0  . Cholecalciferol (VITAMIN D PO) Take 3,000 mcg by mouth daily.      [name] eplerenone (INSPRA) 25 MG tablet Take 1 tablet (25 mg total) by mouth at bedtime.  90 tablet  3  . fluticasone (FLONASE) 50 MCG/ACT nasal spray INSTILL 2 SPRAYS INTO EACH NOSTRIL TWICE A DAY  8 g  5  . furosemide (LASIX) 40 MG tablet Take 80 mg by mouth daily.      . hydroxypropyl methylcellulose (ISOPTO TEARS) 2.5 % ophthalmic solution Place 1 drop into both eyes daily as needed. For dry eyes      . ipratropium (ATROVENT) 0.06 % nasal spray Place 2 sprays into the nose at bedtime.  15 mL  6  . ipratropium (ATROVENT) 0.06 % nasal spray USE 2 SPRAYS IN EACH NOSTRIL 4 TIMES A DAY  15 mL  6  . levocetirizine (XYZAL) 5 MG tablet Take 1  tablet (5 mg total) by mouth every evening.  30 tablet  10  . mexiletine (MEXITIL) 150 MG capsule Take 2 capsules (300 mg total) by mouth 2 (two) times daily.  360 capsule  3  . Multiple Vitamin (MULTIVITAMIN WITH MINERALS) TABS Take 1 tablet by mouth daily.      . nitroGLYCERIN (NITROSTAT) 0.4 MG SL tablet Place 1 tablet (0.4 mg total) under the tongue every 5 (five) minutes as needed.  25 tablet  3  . pantoprazole (PROTONIX) 40 MG tablet 2 (two) times daily.       . potassium chloride SA (K-DUR,KLOR-CON) 20 MEQ tablet Take 1 tablet (20 mEq total) by mouth daily.  30 tablet  0  . valsartan (DIOVAN) 40 MG tablet Take 1 tablet (40 mg total) by mouth daily.  90 tablet  3  . VENTOLIN HFA 108 (90 BASE) MCG/ACT inhaler Inhale 2 puffs into the lungs as needed for shortness of breath.       . warfarin (COUMADIN) 5 MG tablet TAKE AS DIRECTED BY ANTICOAGULATION CLINIC  90 tablet  1   No facility-administered medications prior to visit.    PE: Blood pressure  118/75, pulse 64, temperature 97.6 F (36.4 C), temperature source Temporal, resp. rate 18, height _0  (1.803 m), weight 227 lb (102.967 kg), SpO2 97.00%. VS: noted--normal. Gen: alert, NAD, NONTOXIC APPEARING. HEENT: eyes without injection, drainage, or swelling.  Ears: EACs clear, TMs with normal light reflex and landmarks.  Nose: Clear rhinorrhea, with some dried, crusty exudate adherent to mildly injected mucosa.  No purulent d/c.  No paranasal sinus TTP.  No facial swelling.  Throat and mouth without focal lesion.  No pharyngial swelling, erythema, or exudate.   Neck: supple, no LAD.   LUNGS: CTA bilat, nonlabored resps.   CV: RRR, no m/r/g. EXT: no c/c/e SKIN: no rash  LAB: none  IMPRESSION AND PLAN:  Viral URI, no sign of LRTI or RAD. He seems to be improving today. No new meds recommended today.  His left index finger paronychia is improved. Finish keflex.  An After Visit Summary was printed and given to the patient.  FOLLOW UP: prn

## 2013-01-28 ENCOUNTER — Encounter (HOSPITAL_COMMUNITY): Admission: RE | Payer: Self-pay | Source: Ambulatory Visit

## 2013-01-28 ENCOUNTER — Other Ambulatory Visit (HOSPITAL_COMMUNITY): Payer: Medicare Other

## 2013-01-28 ENCOUNTER — Ambulatory Visit (HOSPITAL_COMMUNITY): Admission: RE | Admit: 2013-01-28 | Payer: Medicare Other | Source: Ambulatory Visit | Admitting: Surgery

## 2013-01-28 SURGERY — REPAIR, HERNIA, VENTRAL, LAPAROSCOPIC
Anesthesia: General

## 2013-02-02 ENCOUNTER — Ambulatory Visit (INDEPENDENT_AMBULATORY_CARE_PROVIDER_SITE_OTHER): Payer: Medicare Other | Admitting: Pulmonary Disease

## 2013-02-02 ENCOUNTER — Encounter: Payer: Self-pay | Admitting: Pulmonary Disease

## 2013-02-02 VITALS — BP 118/78 | HR 60 | Temp 97.8°F | Ht 71.0 in | Wt 235.0 lb

## 2013-02-02 DIAGNOSIS — J310 Chronic rhinitis: Secondary | ICD-10-CM | POA: Diagnosis not present

## 2013-02-02 DIAGNOSIS — G4733 Obstructive sleep apnea (adult) (pediatric): Secondary | ICD-10-CM | POA: Diagnosis not present

## 2013-02-02 MED ORDER — LEVOCETIRIZINE DIHYDROCHLORIDE 5 MG PO TABS: 5.0000 mg | ORAL_TABLET | ORAL | Status: DC

## 2013-02-02 MED ORDER — FLUTICASONE PROPIONATE 50 MCG/ACT NA SUSP: 2.0000 | NASAL | Status: DC

## 2013-02-02 NOTE — Addendum Note (Signed)
Addended by: Virl Cagey on: 02/02/2013 12:22 PM   Modules accepted: Orders

## 2013-02-02 NOTE — Assessment & Plan Note (Signed)
The patient continues to do well on his bilevel device, but needs to keep up with his mask cushion changes and supplies. I've also encouraged him to work aggressively on weight loss.

## 2013-02-02 NOTE — Assessment & Plan Note (Signed)
The patient is maintaining on his nasal corticosteroid, antihistamine, and saline rinses. He still has issues periodically, and I've asked him to consider getting back with his otolaryngologist for a followup.

## 2013-02-02 NOTE — Patient Instructions (Addendum)
Take your fluticasone 2 sprays each nostril each am, and take your zyzal each am as well.  Will send a prescription for these meds. Use saline nasal spray as needed. Make sure you replace the seal on your mask every 2-3 mos to keep a good seal Work on weight loss followup with me in one year if doing well.

## 2013-02-02 NOTE — Progress Notes (Signed)
   Subjective:    Patient ID: [name], male    DOB: 02-Jan-1941, 73 y.o.   MRN: [id number]  HPI The patient comes in today for followup of his known sleep apnea, as well as chronic rhinitis. He is wearing CPAP compliantly, and is having no issues with his pressure. He is having issues with mask leak, but has not kept up with his cushion exchanges. He feels that he is doing well with CPAP overall and is satisfied with his daytime alertness. In terms of his chronic rhinitis, he is staying on his nasal hygiene regimen.   Review of Systems  Constitutional: Negative for fever and unexpected weight change.  HENT: Positive for congestion, postnasal drip and sore throat. Negative for dental problem, ear pain, nosebleeds, rhinorrhea, sinus pressure, sneezing and trouble swallowing.   Eyes: Negative for redness and itching.  Respiratory: Positive for cough. Negative for chest tightness, shortness of breath and wheezing.   Cardiovascular: Negative for palpitations and leg swelling.  Gastrointestinal: Negative for nausea and vomiting.  Genitourinary: Negative for dysuria.  Musculoskeletal: Negative for joint swelling.  Skin: Negative for rash.  Neurological: Negative for headaches.  Hematological: Does not bruise/bleed easily.  Psychiatric/Behavioral: Negative for dysphoric mood. The patient is not nervous/anxious.        Objective:   Physical Exam Overweight male in no acute distress Nose without purulence or discharge noted Neck without lymphadenopathy or thyromegaly No skin breakdown or pressure necrosis from the CPAP mask Chest totally clear to auscultation with no wheezing Cardiac exam with regular rate and rhythm Lower extremities with no edema, no cyanosis Alert and oriented, moves all 4 extremities.       Assessment & Plan:

## 2013-02-04 ENCOUNTER — Other Ambulatory Visit: Payer: Self-pay | Admitting: Cardiology

## 2013-02-10 ENCOUNTER — Other Ambulatory Visit (HOSPITAL_COMMUNITY): Payer: Medicare Other | Admitting: *Deleted

## 2013-02-10 ENCOUNTER — Ambulatory Visit (HOSPITAL_COMMUNITY): Payer: Medicare Other | Attending: Internal Medicine | Admitting: Radiology

## 2013-02-10 ENCOUNTER — Ambulatory Visit (INDEPENDENT_AMBULATORY_CARE_PROVIDER_SITE_OTHER): Payer: Medicare Other | Admitting: Pharmacist

## 2013-02-10 DIAGNOSIS — Z7901 Long term (current) use of anticoagulants: Secondary | ICD-10-CM

## 2013-02-10 DIAGNOSIS — I2589 Other forms of chronic ischemic heart disease: Secondary | ICD-10-CM | POA: Diagnosis not present

## 2013-02-10 DIAGNOSIS — I4891 Unspecified atrial fibrillation: Secondary | ICD-10-CM

## 2013-02-10 DIAGNOSIS — I517 Cardiomegaly: Secondary | ICD-10-CM | POA: Diagnosis not present

## 2013-02-10 DIAGNOSIS — I2510 Atherosclerotic heart disease of native coronary artery without angina pectoris: Secondary | ICD-10-CM | POA: Diagnosis not present

## 2013-02-10 DIAGNOSIS — G4733 Obstructive sleep apnea (adult) (pediatric): Secondary | ICD-10-CM | POA: Diagnosis not present

## 2013-02-10 DIAGNOSIS — I509 Heart failure, unspecified: Secondary | ICD-10-CM | POA: Insufficient documentation

## 2013-02-10 DIAGNOSIS — E785 Hyperlipidemia, unspecified: Secondary | ICD-10-CM | POA: Diagnosis not present

## 2013-02-10 DIAGNOSIS — I080 Rheumatic disorders of both mitral and aortic valves: Secondary | ICD-10-CM | POA: Insufficient documentation

## 2013-02-10 DIAGNOSIS — Z8673 Personal history of transient ischemic attack (TIA), and cerebral infarction without residual deficits: Secondary | ICD-10-CM | POA: Diagnosis not present

## 2013-02-10 DIAGNOSIS — I77810 Thoracic aortic ectasia: Secondary | ICD-10-CM | POA: Insufficient documentation

## 2013-02-10 DIAGNOSIS — I10 Essential (primary) hypertension: Secondary | ICD-10-CM | POA: Diagnosis not present

## 2013-02-10 LAB — POCT INR: INR: 3.3

## 2013-02-10 MED ORDER — PERFLUTREN PROTEIN A MICROSPH IV SUSP
3.0000 mL | Freq: Once | INTRAVENOUS | Status: AC
  Administered 2013-02-10: 3 mL via INTRAVENOUS

## 2013-02-10 NOTE — Progress Notes (Signed)
Echocardiogram performed with [employer].

## 2013-02-12 ENCOUNTER — Encounter (INDEPENDENT_AMBULATORY_CARE_PROVIDER_SITE_OTHER): Payer: BLUE CROSS/BLUE SHIELD | Admitting: Surgery

## 2013-02-12 DIAGNOSIS — B351 Tinea unguium: Secondary | ICD-10-CM | POA: Diagnosis not present

## 2013-02-12 DIAGNOSIS — L821 Other seborrheic keratosis: Secondary | ICD-10-CM | POA: Diagnosis not present

## 2013-02-12 DIAGNOSIS — L570 Actinic keratosis: Secondary | ICD-10-CM | POA: Diagnosis not present

## 2013-02-12 DIAGNOSIS — Z85828 Personal history of other malignant neoplasm of skin: Secondary | ICD-10-CM | POA: Diagnosis not present

## 2013-02-12 DIAGNOSIS — L259 Unspecified contact dermatitis, unspecified cause: Secondary | ICD-10-CM | POA: Diagnosis not present

## 2013-02-16 DIAGNOSIS — N139 Obstructive and reflux uropathy, unspecified: Secondary | ICD-10-CM | POA: Diagnosis not present

## 2013-02-16 DIAGNOSIS — N200 Calculus of kidney: Secondary | ICD-10-CM | POA: Diagnosis not present

## 2013-02-16 DIAGNOSIS — N138 Other obstructive and reflux uropathy: Secondary | ICD-10-CM | POA: Diagnosis not present

## 2013-02-16 DIAGNOSIS — N401 Enlarged prostate with lower urinary tract symptoms: Secondary | ICD-10-CM | POA: Diagnosis not present

## 2013-02-17 ENCOUNTER — Encounter: Payer: Self-pay | Admitting: Family Medicine

## 2013-02-24 ENCOUNTER — Telehealth: Payer: Self-pay | Admitting: Internal Medicine

## 2013-02-24 ENCOUNTER — Other Ambulatory Visit: Payer: Self-pay | Admitting: *Deleted

## 2013-02-24 DIAGNOSIS — Z95810 Presence of automatic (implantable) cardiac defibrillator: Secondary | ICD-10-CM

## 2013-02-24 DIAGNOSIS — I5022 Chronic systolic (congestive) heart failure: Secondary | ICD-10-CM

## 2013-02-24 NOTE — Telephone Encounter (Signed)
Informed pt that wrong echo was ordered (and completed) to begin with and that he would not be charged for this AV optimization echo (per Dr. Caryl Comes) I apologized for the mix up/inconvience - pt very polite and understanding. He is scheduled for 03/24/13. Patient verbalized understanding and agreeable to plan.

## 2013-02-24 NOTE — Telephone Encounter (Signed)
New Problem:  Pt is requesting a call back for clarification on his Echo appt. Pt states he just had an Echo on 1/28... PT wants to know if something is wrong and does he need to keep the echo appt on 3/11... Or is it a mistake and should the appt be canceled... PT would like to speak to the nurse to clarify before anything is done to his 3/11 appt.

## 2013-03-03 ENCOUNTER — Ambulatory Visit (INDEPENDENT_AMBULATORY_CARE_PROVIDER_SITE_OTHER): Payer: Medicare Other | Admitting: *Deleted

## 2013-03-03 DIAGNOSIS — Z5181 Encounter for therapeutic drug level monitoring: Secondary | ICD-10-CM | POA: Diagnosis not present

## 2013-03-03 DIAGNOSIS — Z7901 Long term (current) use of anticoagulants: Secondary | ICD-10-CM

## 2013-03-03 DIAGNOSIS — I4891 Unspecified atrial fibrillation: Secondary | ICD-10-CM

## 2013-03-03 LAB — POCT INR: INR: 2

## 2013-03-05 ENCOUNTER — Other Ambulatory Visit: Payer: Self-pay | Admitting: Cardiology

## 2013-03-24 ENCOUNTER — Ambulatory Visit (INDEPENDENT_AMBULATORY_CARE_PROVIDER_SITE_OTHER): Payer: Medicare Other | Admitting: Pharmacist

## 2013-03-24 ENCOUNTER — Other Ambulatory Visit (HOSPITAL_COMMUNITY): Payer: Medicare Other

## 2013-03-24 ENCOUNTER — Ambulatory Visit (HOSPITAL_COMMUNITY): Payer: Medicare Other | Attending: Cardiovascular Disease | Admitting: Radiology

## 2013-03-24 DIAGNOSIS — Z5181 Encounter for therapeutic drug level monitoring: Secondary | ICD-10-CM | POA: Diagnosis not present

## 2013-03-24 DIAGNOSIS — I5022 Chronic systolic (congestive) heart failure: Secondary | ICD-10-CM

## 2013-03-24 DIAGNOSIS — R0989 Other specified symptoms and signs involving the circulatory and respiratory systems: Secondary | ICD-10-CM

## 2013-03-24 DIAGNOSIS — I4891 Unspecified atrial fibrillation: Secondary | ICD-10-CM

## 2013-03-24 DIAGNOSIS — Z7901 Long term (current) use of anticoagulants: Secondary | ICD-10-CM | POA: Diagnosis not present

## 2013-03-24 DIAGNOSIS — Z95810 Presence of automatic (implantable) cardiac defibrillator: Secondary | ICD-10-CM

## 2013-03-24 LAB — MDC_IDC_ENUM_SESS_TYPE_INCLINIC
Date Time Interrogation Session: 20150311040000
HighPow Impedance: 38 Ohm
HighPow Impedance: 57 Ohm
Implantable Pulse Generator Serial Number: 492371
Lead Channel Impedance Value: 739 Ohm
Lead Channel Impedance Value: 772 Ohm
Lead Channel Pacing Threshold Amplitude: 1.1 V
Lead Channel Pacing Threshold Amplitude: 2.5 V
Lead Channel Pacing Threshold Pulse Width: 0.4 ms
Lead Channel Sensing Intrinsic Amplitude: 4 mV
Lead Channel Sensing Intrinsic Amplitude: 4.3 mV
Lead Channel Sensing Intrinsic Amplitude: 6.7 mV
Lead Channel Setting Pacing Amplitude: 2 V
Lead Channel Setting Pacing Amplitude: 2.5 V
Lead Channel Setting Pacing Pulse Width: 0.4 ms
Lead Channel Setting Pacing Pulse Width: 0.5 ms
Lead Channel Setting Sensing Sensitivity: 1 mV
MDC IDC MSMT LEADCHNL LV PACING THRESHOLD PULSEWIDTH: 0.5 ms
MDC IDC MSMT LEADCHNL RA IMPEDANCE VALUE: 565 Ohm
MDC IDC MSMT LEADCHNL RA PACING THRESHOLD AMPLITUDE: 0.7 V
MDC IDC MSMT LEADCHNL RA PACING THRESHOLD PULSEWIDTH: 0.3 ms
MDC IDC SET LEADCHNL LV PACING AMPLITUDE: 2.5 V
MDC IDC SET LEADCHNL RV SENSING SENSITIVITY: 0.5 mV
MDC IDC SET ZONE DETECTION INTERVAL: 600 ms
Zone Setting Detection Interval: 300 ms
Zone Setting Detection Interval: 333 ms

## 2013-03-24 LAB — POCT INR: INR: 2.3

## 2013-03-24 NOTE — Progress Notes (Signed)
AV Optimization Echocardiogram performed at no charge.

## 2013-03-25 ENCOUNTER — Other Ambulatory Visit: Payer: Self-pay | Admitting: Internal Medicine

## 2013-03-31 ENCOUNTER — Other Ambulatory Visit (INDEPENDENT_AMBULATORY_CARE_PROVIDER_SITE_OTHER): Payer: Medicare Other

## 2013-03-31 ENCOUNTER — Ambulatory Visit
Admission: RE | Admit: 2013-03-31 | Discharge: 2013-03-31 | Disposition: A | Discharge status: Home / Self Care | Payer: Medicare Other | Source: Ambulatory Visit | Attending: Cardiology | Admitting: Cardiology

## 2013-03-31 ENCOUNTER — Ambulatory Visit (INDEPENDENT_AMBULATORY_CARE_PROVIDER_SITE_OTHER): Payer: Medicare Other | Admitting: Cardiology

## 2013-03-31 ENCOUNTER — Telehealth: Payer: Self-pay | Admitting: Internal Medicine

## 2013-03-31 VITALS — Ht 71.0 in | Wt 229.0 lb

## 2013-03-31 DIAGNOSIS — I2510 Atherosclerotic heart disease of native coronary artery without angina pectoris: Secondary | ICD-10-CM | POA: Diagnosis not present

## 2013-03-31 DIAGNOSIS — I2589 Other forms of chronic ischemic heart disease: Secondary | ICD-10-CM | POA: Diagnosis not present

## 2013-03-31 DIAGNOSIS — I10 Essential (primary) hypertension: Secondary | ICD-10-CM | POA: Diagnosis not present

## 2013-03-31 DIAGNOSIS — Z79899 Other long term (current) drug therapy: Secondary | ICD-10-CM

## 2013-03-31 DIAGNOSIS — R7989 Other specified abnormal findings of blood chemistry: Secondary | ICD-10-CM

## 2013-03-31 DIAGNOSIS — I4891 Unspecified atrial fibrillation: Secondary | ICD-10-CM

## 2013-03-31 DIAGNOSIS — E785 Hyperlipidemia, unspecified: Secondary | ICD-10-CM

## 2013-03-31 DIAGNOSIS — Z95810 Presence of automatic (implantable) cardiac defibrillator: Secondary | ICD-10-CM

## 2013-03-31 DIAGNOSIS — R945 Abnormal results of liver function studies: Secondary | ICD-10-CM

## 2013-03-31 LAB — HEPATIC FUNCTION PANEL
ALT: 55 U/L — ABNORMAL HIGH (ref 0–53)
AST: 52 U/L — ABNORMAL HIGH (ref 0–37)
Albumin: 4.1 g/dL (ref 3.5–5.2)
Alkaline Phosphatase: 66 U/L (ref 39–117)
BILIRUBIN DIRECT: 0 mg/dL (ref 0.0–0.3)
BILIRUBIN TOTAL: 0.6 mg/dL (ref 0.3–1.2)
Total Protein: 7.5 g/dL (ref 6.0–8.3)

## 2013-03-31 NOTE — Patient Instructions (Signed)
Your physician recommends that you return for lab work today for bmet, cbc, tsh and hepatic panels.  Your physician has requested that you have a chest xray. You will go to [facility].  Your physician wants you to follow-up in: 6 months with Dr. Stanford Breed. You will receive a reminder letter in the mail two months in advance. If you don't receive a letter, please call our office to schedule the follow-up appointment.  Check with your insurance to see which ARB (angiotension receptor blocker) they will cover.

## 2013-03-31 NOTE — Assessment & Plan Note (Signed)
Continue beta blocker and Coumadin. Check hemoglobin.

## 2013-03-31 NOTE — Assessment & Plan Note (Signed)
euvolemic on examination. Continue present dose of diuretics. Check potassium and renal function.

## 2013-03-31 NOTE — Assessment & Plan Note (Signed)
Continued amiodarone and mexiletine. ICD in place. Check TSH, liver functions and chest x-ray.

## 2013-03-31 NOTE — Assessment & Plan Note (Signed)
Continue ARB and beta blocker. 

## 2013-03-31 NOTE — Assessment & Plan Note (Signed)
Blood pressure controlled. Continue present medications. 

## 2013-03-31 NOTE — Telephone Encounter (Signed)
Walk In pt Form " Sealed Envelope" Dropped off gave to Sherri P

## 2013-03-31 NOTE — Assessment & Plan Note (Signed)
Followed by electrophysiology. 

## 2013-03-31 NOTE — Progress Notes (Signed)
HPI: [name] is a pleasant gentleman with a history of coronary artery disease, mixed ischemic and nonischemic cardiomyopathy, history of ICD, paroxysmal atrial fibrillation, and ventricular tachycardia. Previous carotid Dopplers in Sept  2014 showed 0-39% stenosis bilaterally. Previous abdominal ultrasound in April 2006 showed no aneurysm. Last cardiac catheterization in October 2008 showed an occluded right coronary artery with left-to-right collateralization. Ejection fraction is 20%. There was no other coronary disease noted. Last Myoview in Dec 2011 showed prior inferior infarct but no ischemia. His ejection fraction was 19%. ABIs in December of 2011 were normal. An echocardiogram in January 2015 showed an ejection fraction of 20%. There was dilatation of the aortic root measuring 45 mm. There was mild mitral regurgitation and mild left atrial enlargement. There was mild right atrial enlargement. The patient has had upgrade of his device to CRT-D. Patient had revision of his left ventricular lead in December 2013. Last seen in January of 2014. Since then, the patient denies any dyspnea on exertion, orthopnea, PND, pedal edema, palpitations, syncope or chest pain.    Current Outpatient Prescriptions  Medication Sig Dispense Refill  . amiodarone (PACERONE) 200 MG tablet Take 200 mg by mouth 2 (two) times daily.      . carvedilol (COREG) 12.5 MG tablet TAKE 1 TABLET BY MOUTH TWICE A DAY WITH MEALS  180 tablet  0  . Cholecalciferol (VITAMIN D PO) Take 3,000 mcg by mouth daily.      [name] eplerenone (INSPRA) 25 MG tablet Take 1 tablet (25 mg total) by mouth at bedtime.  90 tablet  3  . fluticasone (FLONASE) 50 MCG/ACT nasal spray INSTILL 2 SPRAYS INTO EACH NOSTRIL TWICE A DAY  8 g  5  . furosemide (LASIX) 40 MG tablet Take 80 mg by mouth daily.      [name] ipratropium (ATROVENT) 0.06 % nasal spray Place 2 sprays into the nose at bedtime.  15 mL  6  . levocetirizine (XYZAL) 5 MG tablet Take 1 tablet  (5 mg total) by mouth every evening.  30 tablet  10  . mexiletine (MEXITIL) 150 MG capsule Take 2 capsules (300 mg total) by mouth 2 (two) times daily.  360 capsule  3  . Multiple Vitamin (MULTIVITAMIN WITH MINERALS) TABS Take 1 tablet by mouth daily.      . nitroGLYCERIN (NITROSTAT) 0.4 MG SL tablet Place 1 tablet (0.4 mg total) under the tongue every 5 (five) minutes as needed.  25 tablet  3  . polyvinyl alcohol-povidone (REFRESH) 1.4-0.6 % ophthalmic solution Place 1-2 drops into both eyes as needed.      . potassium chloride SA (K-DUR,KLOR-CON) 20 MEQ tablet Take 1 tablet (20 mEq total) by mouth daily.  30 tablet  0  . valsartan (DIOVAN) 40 MG tablet Take 1 tablet (40 mg total) by mouth daily.  90 tablet  3  . VENTOLIN HFA 108 (90 BASE) MCG/ACT inhaler Inhale 2 puffs into the lungs as needed for shortness of breath.       . warfarin (COUMADIN) 5 MG tablet TAKE AS DIRECTED BY ANTICOAGULATION CLINIC  90 tablet  1  . pantoprazole (PROTONIX) 40 MG tablet        No current facility-administered medications for this visit.     Past Medical History  Diagnosis Date  . Atrial fibrillation     Ablation for a-flutter 05/2000  . Cardiomyopathy     Ischemic.  EF 20%  . Hypertension   . Hyperlipidemia   .  Allergic rhinitis   . Obstructive sleep apnea     CPAP 16  . CAD (coronary artery disease)     PTCA RCA 1997 (Inf/post MI);  [name] Ventricular tachycardia     ICD, pacer  . Adenomatous colon polyp 2009; 06/2012    Colonoscopy 07/2007---Dr. Barron Schmid GI--Repeat 06/2012 showed one tubular adenoma w/out high grade dysplasia.  . Kidney stones     No distal stones on CT 2010; stable 8 mm RLP stone, 26m LMP stone 10/2013 plain film.  . Myocardial infarction   . Microscopic hematuria     Cysto neg 02/2009 except for BPH  . Diverticulosis of colon 2005    Noted on Colonoscopies in 2005 and 2009 and on CT 2010  . Internal hemorrhoids     Colonoscopy 07/2007  . Hepatic steatosis 2010    Noted on CT  abd 2010 and on u/s abd 04/2009 (transaminasemia)  . Allergic rhinitis   . BPH (benign prostatic hypertrophy)     PSAs ok (followed by urologist)  . History of impaired glucose tolerance   . Shortness of breath   . CHF (congestive heart failure)   . Pacemaker     pacermaker /ICD/CRT---LV lead replacement 12/2012.  F/u echo showed mild improvement in mitral regurg but EF still 10%.  . Paronychia of right thumb 08/22/2012    Past Surgical History  Procedure Laterality Date  . Icd      a) Guidant Contak H170- SVirl Axe MD 07/25/06 (second device) b) Medtronic CRT-D  . Breast surgery  1990    "Tissue removed from breast"  . Umbilical hernia repair    . Hernia repair  1975    bilateral inguinal  . Extracorporeal shock wave lithotripsy    . Atrial ablation surgery    . Cardiac catheterization    . Esophagogastroduodenoscopy N/A 07/13/2012    Procedure: ESOPHAGOGASTRODUODENOSCOPY (EGD);  Surgeon: MJeryl Columbia MD;  [facility];  Service: Endoscopy;  Laterality: N/A;  NORMAL  . Colonoscopy N/A 07/13/2012    Procedure: COLONOSCOPY;  Surgeon: MJeryl Columbia MD;  Location: [facility];  Service: Endoscopy;  Laterality: N/A; polypectomy x 1   . Carotid doppler  09/25/12    No signif stenosis    History   Social History  . Marital Status: Married    Spouse Name: N/A    Number of Children: N/A  . Years of Education: N/A   Occupational History  . Retired    Social History Main Topics  . Smoking status: Former Smoker -- 2.00 packs/day for 15 years    Types: Cigarettes    Quit date: 01/15/1980  . Smokeless tobacco: Never Used  . Alcohol Use: Yes     Comment: couple glasses week  . Drug Use: No  . Sexual Activity: Not on file   Other Topics Concern  . Not on file   Social History Narrative   Married.   Hx of cigarettes: quit in the 1980s.  No alc/drugs.    ROS: no fevers or chills, productive cough, hemoptysis, dysphasia, odynophagia, melena, hematochezia, dysuria,  hematuria, rash, seizure activity, orthopnea, PND, pedal edema, claudication. Remaining systems are negative.  Physical Exam: Well-developed well-nourished in no acute distress.  Skin is warm and dry.  HEENT is normal.  Neck is supple.  Chest is clear to auscultation with normal expansion.  Cardiovascular exam is regular rate and rhythm.  Abdominal exam nontender or distended. No masses palpated. Extremities show no edema. neuro grossly intact  ECG sinus with ventricular pacing.

## 2013-03-31 NOTE — Assessment & Plan Note (Signed)
Not on statin as history of increased liver functions.

## 2013-03-31 NOTE — Assessment & Plan Note (Signed)
Not on aspirin given need for Coumadin.

## 2013-04-01 ENCOUNTER — Encounter: Payer: Self-pay | Admitting: Internal Medicine

## 2013-04-01 ENCOUNTER — Ambulatory Visit (INDEPENDENT_AMBULATORY_CARE_PROVIDER_SITE_OTHER): Payer: Medicare Other | Admitting: *Deleted

## 2013-04-01 DIAGNOSIS — I4729 Other ventricular tachycardia: Secondary | ICD-10-CM

## 2013-04-01 DIAGNOSIS — I472 Ventricular tachycardia: Secondary | ICD-10-CM | POA: Diagnosis not present

## 2013-04-01 LAB — MDC_IDC_ENUM_SESS_TYPE_REMOTE
Battery Remaining Longevity: 77 mo
Brady Statistic RA Percent Paced: 99 %
Brady Statistic RV Percent Paced: 99 %
Date Time Interrogation Session: 20150319042100
HighPow Impedance: 51 Ohm
Lead Channel Impedance Value: 548 Ohm
Lead Channel Impedance Value: 751 Ohm
Lead Channel Sensing Intrinsic Amplitude: 4.4 mV
Lead Channel Setting Pacing Amplitude: 2 V
Lead Channel Setting Pacing Amplitude: 2.5 V
Lead Channel Setting Pacing Pulse Width: 0.4 ms
Lead Channel Setting Pacing Pulse Width: 0.5 ms
MDC IDC MSMT LEADCHNL LV IMPEDANCE VALUE: 719 Ohm
MDC IDC MSMT LEADCHNL LV SENSING INTR AMPL: 4.4 mV
MDC IDC MSMT LEADCHNL RV SENSING INTR AMPL: 7.6 mV
MDC IDC PG SERIAL: 492371
MDC IDC SET LEADCHNL LV PACING AMPLITUDE: 2.5 V
MDC IDC SET ZONE DETECTION INTERVAL: 300 ms
MDC IDC SET ZONE DETECTION INTERVAL: 333 ms
Zone Setting Detection Interval: 600 ms

## 2013-04-05 ENCOUNTER — Other Ambulatory Visit: Payer: Self-pay | Admitting: Internal Medicine

## 2013-04-06 ENCOUNTER — Other Ambulatory Visit: Payer: Self-pay

## 2013-04-06 MED ORDER — AMIODARONE HCL 200 MG PO TABS: 200.0000 mg | ORAL_TABLET | Freq: Two times a day (BID) | ORAL | Status: DC

## 2013-04-09 ENCOUNTER — Other Ambulatory Visit: Payer: Self-pay | Admitting: Internal Medicine

## 2013-04-12 ENCOUNTER — Telehealth: Payer: Self-pay | Admitting: *Deleted

## 2013-04-12 NOTE — Telephone Encounter (Signed)
PA to [facility] for pantoprazole 40 mg bid

## 2013-04-13 ENCOUNTER — Other Ambulatory Visit: Payer: Medicare Other

## 2013-04-13 ENCOUNTER — Telehealth: Payer: Self-pay | Admitting: Cardiology

## 2013-04-13 NOTE — Telephone Encounter (Signed)
New message    Returning nurses call to get test results

## 2013-04-13 NOTE — Telephone Encounter (Signed)
See [facility] lab on 3/18 for details of this matter.

## 2013-04-13 NOTE — Progress Notes (Signed)
ICD remote

## 2013-04-13 NOTE — Telephone Encounter (Signed)
Spoke with pt, aware of lab results. He reports he is taking amiodarone 200 mg once daily. Will make dr Caryl Comes aware.

## 2013-04-14 ENCOUNTER — Encounter: Payer: Self-pay | Admitting: *Deleted

## 2013-04-20 ENCOUNTER — Encounter: Payer: Self-pay | Admitting: Internal Medicine

## 2013-04-20 DIAGNOSIS — N138 Other obstructive and reflux uropathy: Secondary | ICD-10-CM | POA: Diagnosis not present

## 2013-04-20 DIAGNOSIS — N401 Enlarged prostate with lower urinary tract symptoms: Secondary | ICD-10-CM | POA: Diagnosis not present

## 2013-04-20 DIAGNOSIS — N139 Obstructive and reflux uropathy, unspecified: Secondary | ICD-10-CM | POA: Diagnosis not present

## 2013-04-21 ENCOUNTER — Ambulatory Visit (INDEPENDENT_AMBULATORY_CARE_PROVIDER_SITE_OTHER): Payer: Medicare Other | Admitting: *Deleted

## 2013-04-21 ENCOUNTER — Other Ambulatory Visit: Payer: Self-pay | Admitting: *Deleted

## 2013-04-21 DIAGNOSIS — Z5181 Encounter for therapeutic drug level monitoring: Secondary | ICD-10-CM

## 2013-04-21 DIAGNOSIS — I4891 Unspecified atrial fibrillation: Secondary | ICD-10-CM | POA: Diagnosis not present

## 2013-04-21 DIAGNOSIS — Z7901 Long term (current) use of anticoagulants: Secondary | ICD-10-CM | POA: Diagnosis not present

## 2013-04-21 DIAGNOSIS — R899 Unspecified abnormal finding in specimens from other organs, systems and tissues: Secondary | ICD-10-CM

## 2013-04-21 LAB — POCT INR: INR: 2.4

## 2013-05-12 ENCOUNTER — Other Ambulatory Visit (INDEPENDENT_AMBULATORY_CARE_PROVIDER_SITE_OTHER): Payer: Medicare Other

## 2013-05-12 DIAGNOSIS — R899 Unspecified abnormal finding in specimens from other organs, systems and tissues: Secondary | ICD-10-CM

## 2013-05-12 DIAGNOSIS — I4891 Unspecified atrial fibrillation: Secondary | ICD-10-CM

## 2013-05-12 DIAGNOSIS — R6889 Other general symptoms and signs: Secondary | ICD-10-CM

## 2013-05-12 DIAGNOSIS — Z79899 Other long term (current) drug therapy: Secondary | ICD-10-CM | POA: Diagnosis not present

## 2013-05-12 LAB — HEPATIC FUNCTION PANEL
ALT: 51 U/L (ref 0–53)
ALT: 50 U/L (ref 0–53)
AST: 49 U/L — ABNORMAL HIGH (ref 0–37)
AST: 50 U/L — ABNORMAL HIGH (ref 0–37)
Albumin: 3.9 g/dL (ref 3.5–5.2)
Albumin: 3.9 g/dL (ref 3.5–5.2)
Alkaline Phosphatase: 58 U/L (ref 39–117)
Alkaline Phosphatase: 58 U/L (ref 39–117)
BILIRUBIN DIRECT: 0.1 mg/dL (ref 0.0–0.3)
BILIRUBIN TOTAL: 0.7 mg/dL (ref 0.3–1.2)
BILIRUBIN TOTAL: 1 mg/dL (ref 0.3–1.2)
Bilirubin, Direct: 0.1 mg/dL (ref 0.0–0.3)
Total Protein: 7.1 g/dL (ref 6.0–8.3)
Total Protein: 7.1 g/dL (ref 6.0–8.3)

## 2013-05-12 LAB — BASIC METABOLIC PANEL
BUN: 19 mg/dL (ref 6–23)
CALCIUM: 8.9 mg/dL (ref 8.4–10.5)
CO2: 24 mEq/L (ref 19–32)
Chloride: 105 mEq/L (ref 96–112)
Creatinine, Ser: 1 mg/dL (ref 0.4–1.5)
GFR: 80.94 mL/min (ref >60.00)
GLUCOSE: 107 mg/dL — AB (ref 70–99)
Potassium: 4 mEq/L (ref 3.5–5.1)
SODIUM: 135 meq/L (ref 135–145)

## 2013-05-12 LAB — CBC
HEMATOCRIT: 45.8 % (ref 39.0–52.0)
Hemoglobin: 15.3 g/dL (ref 13.0–17.0)
MCHC: 33.5 g/dL (ref 30.0–36.0)
MCV: 101.2 fl — AB (ref 78.0–100.0)
Platelets: 335 10*3/uL (ref 150.0–400.0)
RBC: 4.52 Mil/uL (ref 4.22–5.81)
RDW: 14.4 % (ref 11.5–14.6)
WBC: 7.3 10*3/uL (ref 4.5–10.5)

## 2013-05-12 LAB — TSH: TSH: 1.75 u[IU]/mL (ref 0.35–5.50)

## 2013-05-13 ENCOUNTER — Other Ambulatory Visit: Payer: Self-pay

## 2013-05-13 MED ORDER — AMIODARONE HCL 200 MG PO TABS: 200.0000 mg | ORAL_TABLET | Freq: Two times a day (BID) | ORAL | Status: DC

## 2013-05-17 ENCOUNTER — Other Ambulatory Visit: Payer: Self-pay | Admitting: *Deleted

## 2013-05-17 IMAGING — CT CT HEAD W/O CM
1 series · 16 of 30 positions shown, 20 images · non-contrast
Comparison: CT paranasal sinuses 01/04/2008

CLINICAL DATA: Cognitive impairment, tinnitus

CT HEAD WITHOUT CONTRAST
TECHNIQUE: Contiguous axial images were obtained from the base of
the skull through the vertex without contrast.

[Series 2: head 4.8 h37s · axial · 0.45mm/px · z∈[-41,+91]mm · 16 of 32 slices shown, 20 images]
[im 2/32  brain]
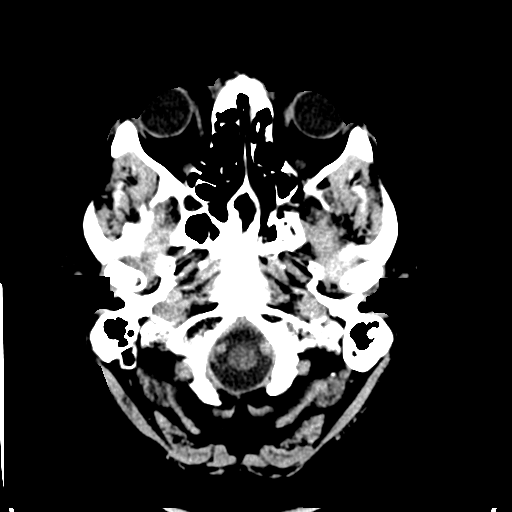
[im 2/32  bone]
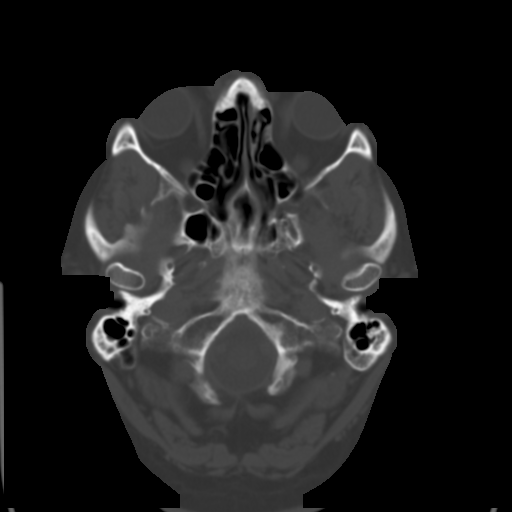
[im 4/32  brain]
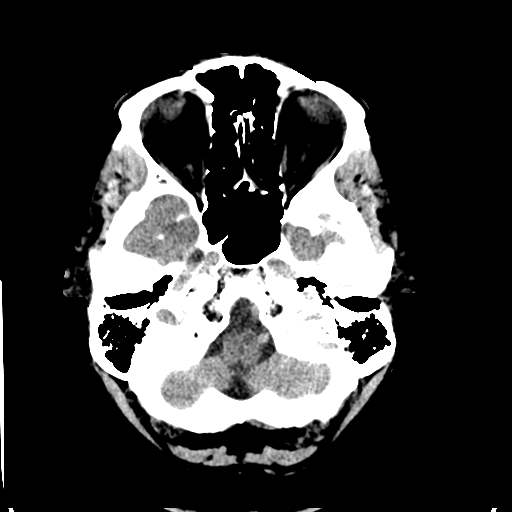
[im 6/32  brain]
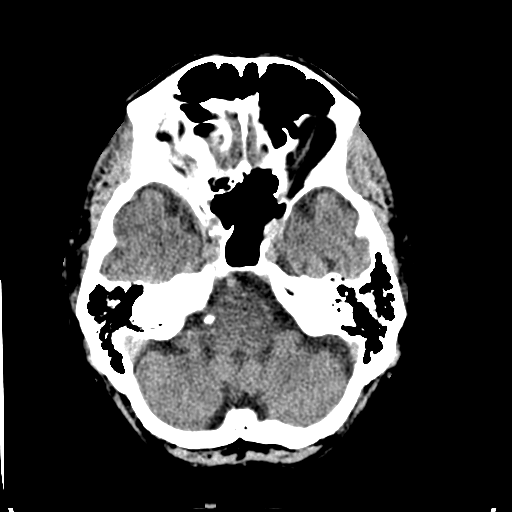
[im 8/32  brain]
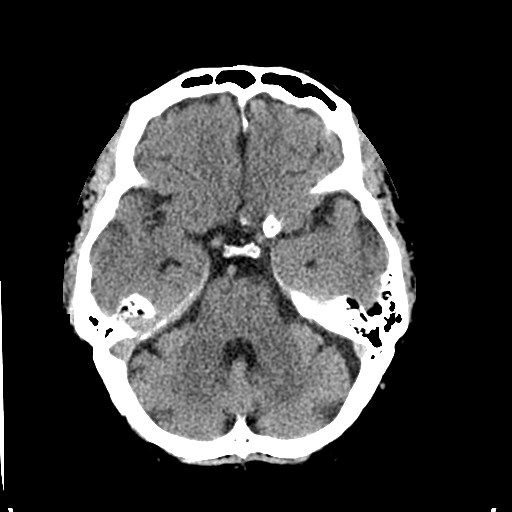
[im 9/32  brain]
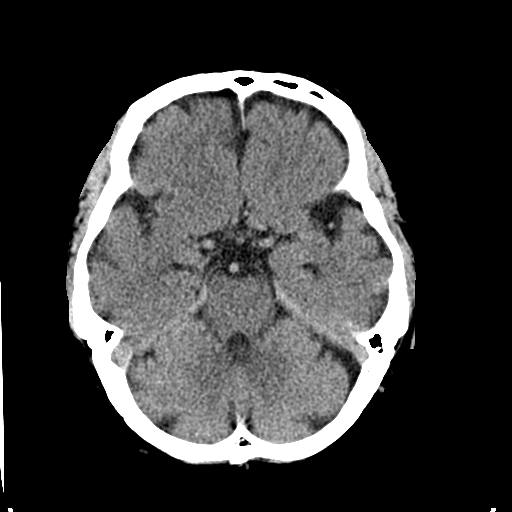
[im 9/32  bone]
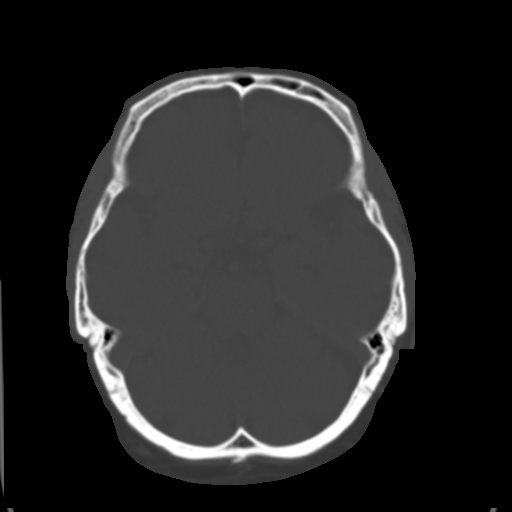
[im 11/32  brain]
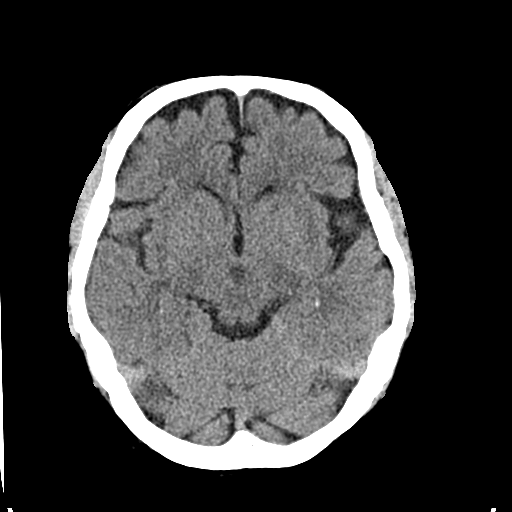
[im 13/32  brain]
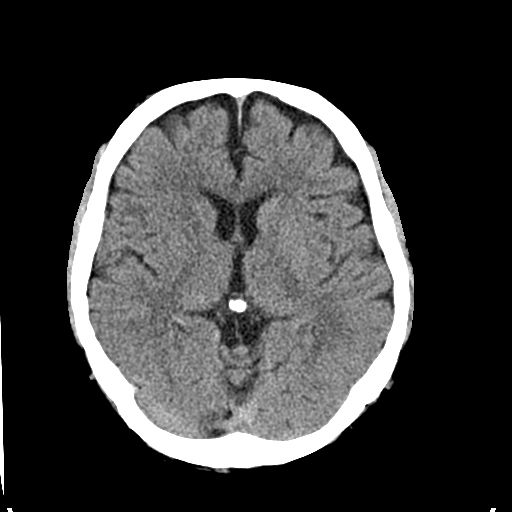
[im 15/32  brain]
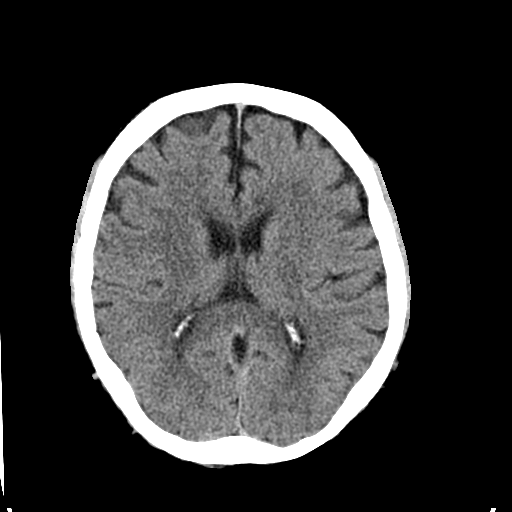
[im 17/32  brain]
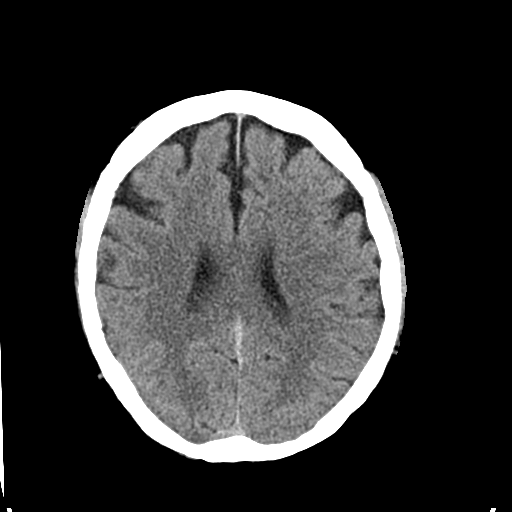
[im 17/32  bone]
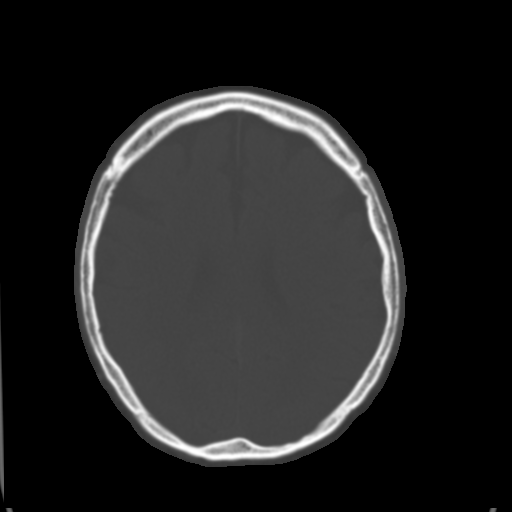
[im 19/32  brain]
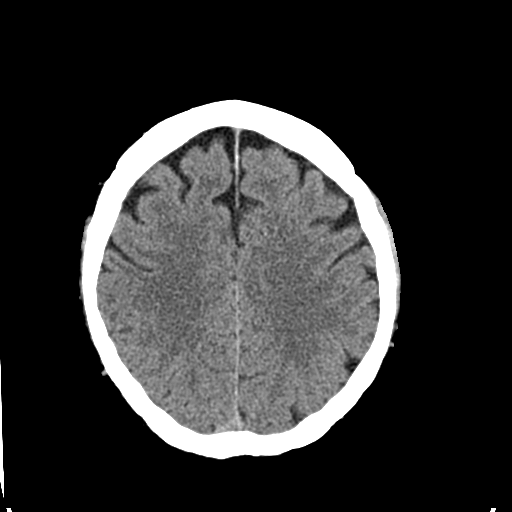
[im 21/32  brain]
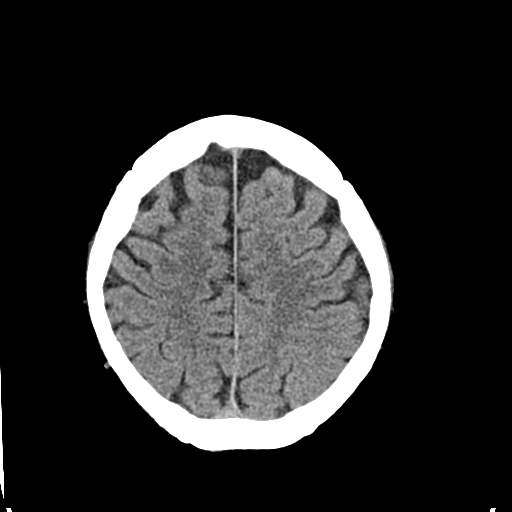
[im 23/32  brain]
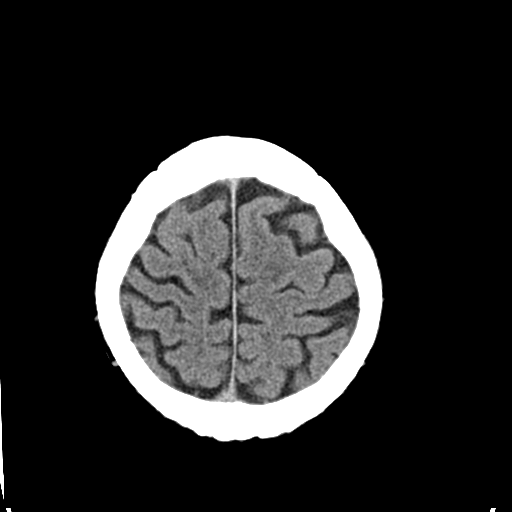
[im 24/32  brain]
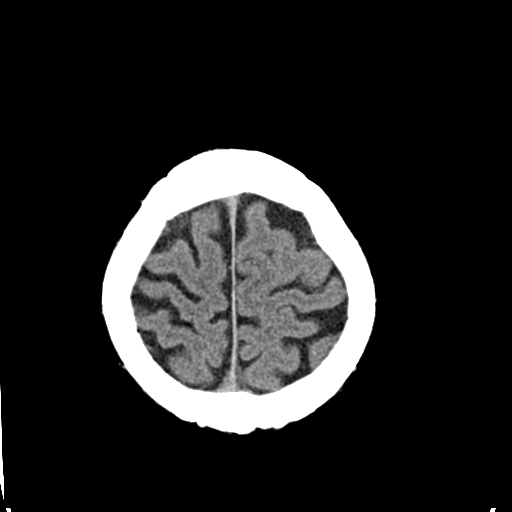
[im 24/32  bone]
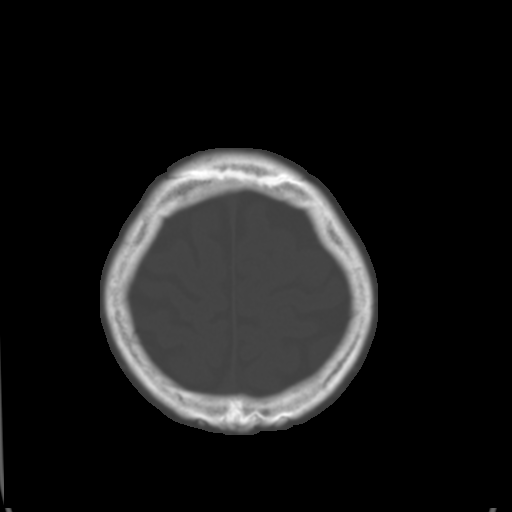
[im 26/32  brain]
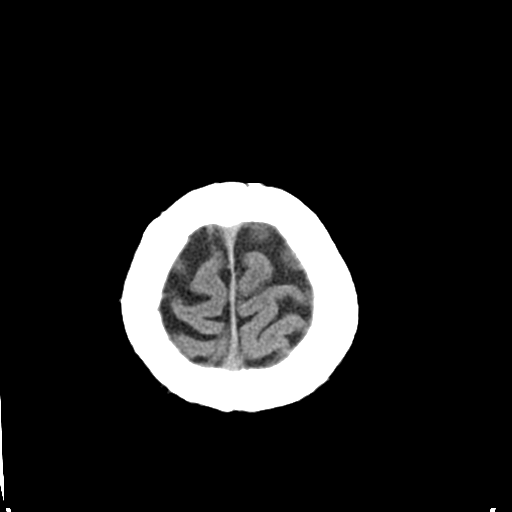
[im 28/32  brain]
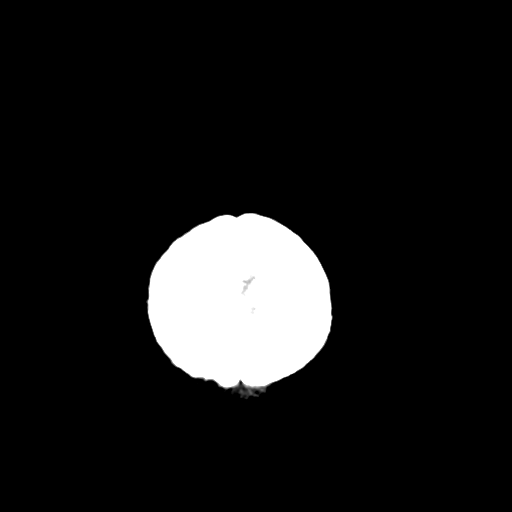
[im 30/32  brain]
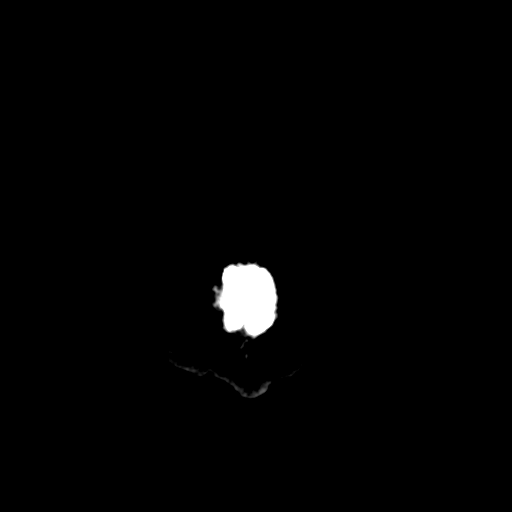

[16 of 30 positions shown; findings below may reference images not displayed]

FINDINGS: No skull fracture is noted.  The visualized paranasal
sinuses and mastoid air cells are unremarkable.

No intracranial hemorrhage, mass effect or midline shift.

Mild cerebral atrophy.  No acute infarction.  No mass lesion is
noted on this unenhanced scan.  No intra or extra-axial fluid
collection.
IMPRESSION: No acute intracranial abnormality.  Mild cerebral atrophy.

## 2013-05-17 MED ORDER — FLUTICASONE PROPIONATE 50 MCG/ACT NA SUSP: NASAL | Status: DC

## 2013-06-02 ENCOUNTER — Ambulatory Visit (INDEPENDENT_AMBULATORY_CARE_PROVIDER_SITE_OTHER): Payer: Medicare Other | Admitting: *Deleted

## 2013-06-02 DIAGNOSIS — I4891 Unspecified atrial fibrillation: Secondary | ICD-10-CM

## 2013-06-02 DIAGNOSIS — Z7901 Long term (current) use of anticoagulants: Secondary | ICD-10-CM | POA: Diagnosis not present

## 2013-06-02 DIAGNOSIS — Z5181 Encounter for therapeutic drug level monitoring: Secondary | ICD-10-CM | POA: Diagnosis not present

## 2013-06-02 LAB — POCT INR: INR: 2.2

## 2013-06-15 ENCOUNTER — Other Ambulatory Visit: Payer: Self-pay | Admitting: Internal Medicine

## 2013-06-18 ENCOUNTER — Other Ambulatory Visit: Payer: Self-pay | Admitting: Internal Medicine

## 2013-06-29 ENCOUNTER — Other Ambulatory Visit: Payer: Self-pay

## 2013-06-29 DIAGNOSIS — I429 Cardiomyopathy, unspecified: Secondary | ICD-10-CM

## 2013-06-29 DIAGNOSIS — I4891 Unspecified atrial fibrillation: Secondary | ICD-10-CM

## 2013-06-29 DIAGNOSIS — I059 Rheumatic mitral valve disease, unspecified: Secondary | ICD-10-CM

## 2013-06-29 MED ORDER — VALSARTAN 40 MG PO TABS: 40.0000 mg | ORAL_TABLET | Freq: Every day | ORAL | Status: DC

## 2013-07-06 ENCOUNTER — Ambulatory Visit (INDEPENDENT_AMBULATORY_CARE_PROVIDER_SITE_OTHER): Payer: Medicare Other | Admitting: *Deleted

## 2013-07-06 ENCOUNTER — Encounter: Payer: Self-pay | Admitting: Internal Medicine

## 2013-07-06 DIAGNOSIS — Z95810 Presence of automatic (implantable) cardiac defibrillator: Secondary | ICD-10-CM | POA: Diagnosis not present

## 2013-07-06 DIAGNOSIS — I4729 Other ventricular tachycardia: Secondary | ICD-10-CM | POA: Diagnosis not present

## 2013-07-06 DIAGNOSIS — I472 Ventricular tachycardia: Secondary | ICD-10-CM

## 2013-07-06 NOTE — Progress Notes (Signed)
Remote ICD transmission.   

## 2013-07-09 ENCOUNTER — Other Ambulatory Visit: Payer: Self-pay

## 2013-07-09 MED ORDER — CARVEDILOL 12.5 MG PO TABS: 12.5000 mg | ORAL_TABLET | Freq: Two times a day (BID) | ORAL | Status: DC

## 2013-07-09 NOTE — Telephone Encounter (Signed)
carvedilol (COREG) 12.5 MG tablet  TAKE 1 TABLET BY MOUTH TWICE A DAY WITH MEALS   180 tablet   0    Patient Instructions    Your physician recommends that you return for lab work today for bmet, cbc, tsh and hepatic panels.   Your physician has requested that you have a chest xray. You will go to [facility].   Your physician wants you to follow-up in: 6 months with Dr. Stanford Breed. You will receive a reminder letter in the mail two months in advance. If you don't receive a letter, please call our office to schedule the follow-up appointment.   Check with your insurance to see which ARB (angiotension receptor blocker) they will cover.  Previous Visit      Provider Department Encounter #    03/31/2013  8:10 AM Kirk Ruths, MD [facility] [id number]

## 2013-07-11 LAB — MDC_IDC_ENUM_SESS_TYPE_REMOTE
Brady Statistic RA Percent Paced: 100 %
Brady Statistic RV Percent Paced: 99 %
Lead Channel Impedance Value: 771 Ohm
MDC IDC MSMT LEADCHNL RA IMPEDANCE VALUE: 566 Ohm
MDC IDC PG SERIAL: 492371

## 2013-07-14 ENCOUNTER — Ambulatory Visit (INDEPENDENT_AMBULATORY_CARE_PROVIDER_SITE_OTHER): Payer: Medicare Other | Admitting: *Deleted

## 2013-07-14 DIAGNOSIS — Z5181 Encounter for therapeutic drug level monitoring: Secondary | ICD-10-CM

## 2013-07-14 DIAGNOSIS — Z7901 Long term (current) use of anticoagulants: Secondary | ICD-10-CM | POA: Diagnosis not present

## 2013-07-14 DIAGNOSIS — I4891 Unspecified atrial fibrillation: Secondary | ICD-10-CM

## 2013-07-14 LAB — POCT INR: INR: 2.2

## 2013-07-20 ENCOUNTER — Encounter: Payer: Self-pay | Admitting: Cardiology

## 2013-07-22 DIAGNOSIS — L819 Disorder of pigmentation, unspecified: Secondary | ICD-10-CM | POA: Diagnosis not present

## 2013-07-22 DIAGNOSIS — L821 Other seborrheic keratosis: Secondary | ICD-10-CM | POA: Diagnosis not present

## 2013-07-22 DIAGNOSIS — L570 Actinic keratosis: Secondary | ICD-10-CM | POA: Diagnosis not present

## 2013-07-22 DIAGNOSIS — Z85828 Personal history of other malignant neoplasm of skin: Secondary | ICD-10-CM | POA: Diagnosis not present

## 2013-08-23 ENCOUNTER — Other Ambulatory Visit: Payer: Self-pay

## 2013-08-23 MED ORDER — FUROSEMIDE 40 MG PO TABS: 80.0000 mg | ORAL_TABLET | Freq: Every day | ORAL | Status: DC

## 2013-08-25 ENCOUNTER — Ambulatory Visit (INDEPENDENT_AMBULATORY_CARE_PROVIDER_SITE_OTHER): Payer: Medicare Other | Admitting: Pharmacist

## 2013-08-25 DIAGNOSIS — I4891 Unspecified atrial fibrillation: Secondary | ICD-10-CM

## 2013-08-25 DIAGNOSIS — Z5181 Encounter for therapeutic drug level monitoring: Secondary | ICD-10-CM | POA: Diagnosis not present

## 2013-08-25 DIAGNOSIS — Z7901 Long term (current) use of anticoagulants: Secondary | ICD-10-CM

## 2013-08-25 LAB — POCT INR: INR: 2.7

## 2013-09-12 ENCOUNTER — Other Ambulatory Visit: Payer: Self-pay | Admitting: Internal Medicine

## 2013-09-28 ENCOUNTER — Ambulatory Visit (INDEPENDENT_AMBULATORY_CARE_PROVIDER_SITE_OTHER): Payer: Medicare Other | Admitting: Cardiology

## 2013-09-28 ENCOUNTER — Encounter: Payer: Self-pay | Admitting: Cardiology

## 2013-09-28 VITALS — BP 132/82 | HR 60 | Ht 70.0 in | Wt 226.9 lb

## 2013-09-28 DIAGNOSIS — I428 Other cardiomyopathies: Secondary | ICD-10-CM | POA: Diagnosis not present

## 2013-09-28 DIAGNOSIS — I4891 Unspecified atrial fibrillation: Secondary | ICD-10-CM | POA: Diagnosis not present

## 2013-09-28 DIAGNOSIS — I480 Paroxysmal atrial fibrillation: Secondary | ICD-10-CM

## 2013-09-28 DIAGNOSIS — I2589 Other forms of chronic ischemic heart disease: Secondary | ICD-10-CM | POA: Diagnosis not present

## 2013-09-28 DIAGNOSIS — Z95810 Presence of automatic (implantable) cardiac defibrillator: Secondary | ICD-10-CM | POA: Diagnosis not present

## 2013-09-28 DIAGNOSIS — I712 Thoracic aortic aneurysm, without rupture, unspecified: Secondary | ICD-10-CM | POA: Insufficient documentation

## 2013-09-28 DIAGNOSIS — I059 Rheumatic mitral valve disease, unspecified: Secondary | ICD-10-CM | POA: Diagnosis not present

## 2013-09-28 DIAGNOSIS — I429 Cardiomyopathy, unspecified: Secondary | ICD-10-CM

## 2013-09-28 MED ORDER — CARVEDILOL 12.5 MG PO TABS: 12.5000 mg | ORAL_TABLET | Freq: Two times a day (BID) | ORAL | Status: DC

## 2013-09-28 MED ORDER — POTASSIUM CHLORIDE CRYS ER 20 MEQ PO TBCR: 20.0000 meq | EXTENDED_RELEASE_TABLET | Freq: Every day | ORAL | Status: DC

## 2013-09-28 MED ORDER — WARFARIN SODIUM 5 MG PO TABS: 5.0000 mg | ORAL_TABLET | Freq: Once | ORAL | Status: DC

## 2013-09-28 MED ORDER — EPLERENONE 25 MG PO TABS: 25.0000 mg | ORAL_TABLET | Freq: Every day | ORAL | Status: DC

## 2013-09-28 MED ORDER — VALSARTAN 40 MG PO TABS: 40.0000 mg | ORAL_TABLET | Freq: Every day | ORAL | Status: DC

## 2013-09-28 MED ORDER — MEXILETINE HCL 150 MG PO CAPS: 300.0000 mg | ORAL_CAPSULE | Freq: Two times a day (BID) | ORAL | Status: DC

## 2013-09-28 MED ORDER — AMIODARONE HCL 200 MG PO TABS: 200.0000 mg | ORAL_TABLET | Freq: Every day | ORAL | Status: DC

## 2013-09-28 MED ORDER — NITROGLYCERIN 0.4 MG SL SUBL: 0.4000 mg | SUBLINGUAL_TABLET | SUBLINGUAL | Status: DC | PRN

## 2013-09-28 NOTE — Assessment & Plan Note (Signed)
Patient remains in sinus rhythm.Continued amiodarone and Coumadin. Check hemoglobin. Check TSH, liver functions.

## 2013-09-28 NOTE — Assessment & Plan Note (Signed)
Euvolemic on examination. Continue present dose of diuretics. Check potassium and renal function. 

## 2013-09-28 NOTE — Assessment & Plan Note (Signed)
Aorta dilated on previous echo. Check CTA to further assess.

## 2013-09-28 NOTE — Assessment & Plan Note (Signed)
Blood pressure controlled. Continue present medications. 

## 2013-09-28 NOTE — Assessment & Plan Note (Signed)
Continue present medications.

## 2013-09-28 NOTE — Assessment & Plan Note (Signed)
Continue ARB and beta blocker. 

## 2013-09-28 NOTE — Patient Instructions (Signed)
Your physician wants you to follow-up in: [place] will receive a reminder letter in the mail two months in advance. If you don't receive a letter, please call our office to schedule the follow-up appointment.   Your physician recommends that you HAVE LAB WORK TODAY  Non-Cardiac CT Angiography (CTA), is a special type of CT scan that uses a computer to produce multi-dimensional views of major blood vessels throughout the body. In CT angiography, a contrast material is injected through an IV to help visualize the blood vessels CTA OF CHEST W AND WO TO F/U THORACIC ANEURYSM AT [place]

## 2013-09-28 NOTE — Assessment & Plan Note (Signed)
Not on statin as he had increased liver functions previously. Not on aspirin given need for Coumadin.

## 2013-09-28 NOTE — Progress Notes (Signed)
HPI: Mr. Candee is a pleasant gentleman with a history of coronary artery disease, mixed ischemic and nonischemic cardiomyopathy, history of ICD, paroxysmal atrial fibrillation, and ventricular tachycardia. Previous carotid Dopplers in Sept 2014 showed 0-39% stenosis bilaterally. Previous abdominal ultrasound in April 2006 showed no aneurysm. Last cardiac catheterization in October 2008 showed an occluded right coronary artery with left-to-right collateralization. Ejection fraction is 20%. There was no other coronary disease noted. Last Myoview in Dec 2011 showed prior inferior infarct but no ischemia. His ejection fraction was 19%. ABIs in December of 2011 were normal. An echocardiogram in January 2015 showed an ejection fraction of 20%. There was dilatation of the aortic root measuring 45 mm. There was mild mitral regurgitation and mild left atrial enlargement. There was mild right atrial enlargement. The patient has had upgrade of his device to CRT-D. Patient had revision of his left ventricular lead in December 2013. Last seen in March 2015. Since then, the patient denies any dyspnea on exertion, orthopnea, PND, pedal edema, palpitations, syncope or chest pain.   Current Outpatient Prescriptions  Medication Sig Dispense Refill  . amiodarone (PACERONE) 200 MG tablet Take 1 tablet (200 mg total) by mouth 2 (two) times daily.  90 tablet  2  . carvedilol (COREG) 12.5 MG tablet Take 1 tablet (12.5 mg total) by mouth 2 (two) times daily with a meal.  180 tablet  0  . Cholecalciferol (VITAMIN D PO) Take 3,000 mcg by mouth daily.      [name] eplerenone (INSPRA) 25 MG tablet TAKE 1 TABLET BY MOUTH AT BEDTIME  90 tablet  0  . fluticasone (FLONASE) 50 MCG/ACT nasal spray INSTILL 2 SPRAYS INTO EACH NOSTRIL TWICE A DAY  8 g  5  . furosemide (LASIX) 40 MG tablet Take 2 tablets (80 mg total) by mouth daily.  180 tablet  1  . ipratropium (ATROVENT) 0.06 % nasal spray Place 2 sprays into the nose at bedtime.  15  mL  6  . KLOR-CON M20 20 MEQ tablet TAKE 1 TABLET (20 MEQ TOTAL) BY MOUTH DAILY.  90 tablet  2  . levocetirizine (XYZAL) 5 MG tablet Take 1 tablet (5 mg total) by mouth every evening.  30 tablet  10  . mexiletine (MEXITIL) 150 MG capsule TAKE 2 CAPSULES (300 MG TOTAL) BY MOUTH 2 (TWO) TIMES DAILY.  360 capsule  1  . Multiple Vitamin (MULTIVITAMIN WITH MINERALS) TABS Take 1 tablet by mouth daily.      [name] NITROSTAT 0.4 MG SL tablet PLACE 1 TABLET (0.4 MG TOTAL) UNDER THE TONGUE EVERY 5 (FIVE) MINUTES AS NEEDED.  25 tablet  0  . pantoprazole (PROTONIX) 40 MG tablet       . pantoprazole (PROTONIX) 40 MG tablet TAKE 1 TABLET (40 MG TOTAL) BY MOUTH 2 (TWO) TIMES DAILY.  180 tablet  1  . polyvinyl alcohol-povidone (REFRESH) 1.4-0.6 % ophthalmic solution Place 1-2 drops into both eyes as needed.      . potassium chloride SA (K-DUR,KLOR-CON) 20 MEQ tablet Take 1 tablet (20 mEq total) by mouth daily.  30 tablet  0  . Psyllium (METAMUCIL PO) Take by mouth. 3 teaspoon a day      . valsartan (DIOVAN) 40 MG tablet Take 1 tablet (40 mg total) by mouth daily.  90 tablet  1  . VENTOLIN HFA 108 (90 BASE) MCG/ACT inhaler Inhale 2 puffs into the lungs as needed for shortness of breath.       [name]  warfarin (COUMADIN) 5 MG tablet TAKE AS DIRECTED BY ANTICOAGULATION CLINIC  90 tablet  1   No current facility-administered medications for this visit.     Past Medical History  Diagnosis Date  . Atrial fibrillation     Ablation for a-flutter 05/2000  . Cardiomyopathy     Ischemic.  EF 20%  . Hypertension   . Hyperlipidemia   . Allergic rhinitis   . Obstructive sleep apnea     CPAP 16  . CAD (coronary artery disease)     PTCA RCA 1997 (Inf/post MI);  [name] Ventricular tachycardia     ICD, pacer  . Adenomatous colon polyp 2009; 06/2012    Colonoscopy 07/2007---Dr. Barron Schmid GI--Repeat 06/2012 showed one tubular adenoma w/out high grade dysplasia.  . Kidney stones     No distal stones on CT 2010; stable 8 mm RLP  stone, 51m LMP stone 10/2013 plain film.  . Myocardial infarction   . Microscopic hematuria     Cysto neg 02/2009 except for BPH  . Diverticulosis of colon 2005    Noted on Colonoscopies in 2005 and 2009 and on CT 2010  . Internal hemorrhoids     Colonoscopy 07/2007  . Hepatic steatosis 2010    Noted on CT abd 2010 and on u/s abd 04/2009 (transaminasemia)  . Allergic rhinitis   . BPH (benign prostatic hypertrophy)     PSAs ok (followed by urologist)  . History of impaired glucose tolerance   . Shortness of breath   . CHF (congestive heart failure)   . Pacemaker     pacermaker /ICD/CRT---LV lead replacement 12/2012.  F/u echo showed mild improvement in mitral regurg but EF still 10%.  . Paronychia of right thumb 08/22/2012    Past Surgical History  Procedure Laterality Date  . Icd      a) Guidant Contak H170- SVirl Axe MD 07/25/06 (second device) b) Medtronic CRT-D  . Breast surgery  1990    "Tissue removed from breast"  . Umbilical hernia repair    . Hernia repair  1975    bilateral inguinal  . Extracorporeal shock wave lithotripsy    . Atrial ablation surgery    . Cardiac catheterization    . Esophagogastroduodenoscopy N/A 07/13/2012    Procedure: ESOPHAGOGASTRODUODENOSCOPY (EGD);  Surgeon: MJeryl Columbia MD;  [facility];  Service: Endoscopy;  Laterality: N/A;  NORMAL  . Colonoscopy N/A 07/13/2012    Procedure: COLONOSCOPY;  Surgeon: MJeryl Columbia MD;  Location: [facility];  Service: Endoscopy;  Laterality: N/A; polypectomy x 1   . Carotid doppler  09/25/12    No signif stenosis    History   Social History  . Marital Status: Married    Spouse Name: N/A    Number of Children: N/A  . Years of Education: N/A   Occupational History  . Retired    Social History Main Topics  . Smoking status: Former Smoker -- 2.00 packs/day for 15 years    Types: Cigarettes    Quit date: 01/15/1980  . Smokeless tobacco: Never Used  . Alcohol Use: Yes     Comment: couple  glasses week  . Drug Use: No  . Sexual Activity: Not on file   Other Topics Concern  . Not on file   Social History Narrative   Married.   Hx of cigarettes: quit in the 1980s.  No alc/drugs.    ROS: no fevers or chills, productive cough, hemoptysis, dysphasia, odynophagia, melena, hematochezia, dysuria, hematuria,  rash, seizure activity, orthopnea, PND, pedal edema, claudication. Remaining systems are negative.  Physical Exam: Well-developed well-nourished in no acute distress.  Skin is warm and dry.  HEENT is normal.  Neck is supple.  Chest is clear to auscultation with normal expansion.  Cardiovascular exam is regular rate and rhythm.  Abdominal exam nontender or distended. No masses palpated. Extremities show no edema. neuro grossly intact  ECG  Sinus rhythm with ventricular pacing.

## 2013-09-28 NOTE — Assessment & Plan Note (Signed)
Managed by Electrophysiology.

## 2013-09-29 ENCOUNTER — Ambulatory Visit (INDEPENDENT_AMBULATORY_CARE_PROVIDER_SITE_OTHER)
Admission: RE | Admit: 2013-09-29 | Discharge: 2013-09-29 | Disposition: A | Discharge status: Home / Self Care | Payer: Medicare Other | Source: Ambulatory Visit | Attending: Cardiology | Admitting: Cardiology

## 2013-09-29 DIAGNOSIS — I429 Cardiomyopathy, unspecified: Secondary | ICD-10-CM

## 2013-09-29 DIAGNOSIS — I059 Rheumatic mitral valve disease, unspecified: Secondary | ICD-10-CM | POA: Diagnosis not present

## 2013-09-29 DIAGNOSIS — I4891 Unspecified atrial fibrillation: Secondary | ICD-10-CM

## 2013-09-29 DIAGNOSIS — Z95810 Presence of automatic (implantable) cardiac defibrillator: Secondary | ICD-10-CM

## 2013-09-29 DIAGNOSIS — I700 Atherosclerosis of aorta: Secondary | ICD-10-CM | POA: Diagnosis not present

## 2013-09-29 DIAGNOSIS — I428 Other cardiomyopathies: Secondary | ICD-10-CM | POA: Diagnosis not present

## 2013-09-29 DIAGNOSIS — I480 Paroxysmal atrial fibrillation: Secondary | ICD-10-CM

## 2013-09-29 DIAGNOSIS — J8410 Pulmonary fibrosis, unspecified: Secondary | ICD-10-CM | POA: Diagnosis not present

## 2013-09-29 LAB — HEPATIC FUNCTION PANEL
ALK PHOS: 66 U/L (ref 39–117)
ALT: 41 U/L (ref 0–53)
AST: 40 U/L — ABNORMAL HIGH (ref 0–37)
Albumin: 4.1 g/dL (ref 3.5–5.2)
BILIRUBIN INDIRECT: 0.6 mg/dL (ref 0.2–1.2)
Bilirubin, Direct: 0.1 mg/dL (ref 0.0–0.3)
TOTAL PROTEIN: 7 g/dL (ref 6.0–8.3)
Total Bilirubin: 0.7 mg/dL (ref 0.2–1.2)

## 2013-09-29 LAB — BASIC METABOLIC PANEL WITH GFR
BUN: 19 mg/dL (ref 6–23)
CHLORIDE: 102 meq/L (ref 96–112)
CO2: 25 meq/L (ref 19–32)
Calcium: 9.5 mg/dL (ref 8.4–10.5)
Creat: 1.15 mg/dL (ref 0.50–1.35)
GFR, Est African American: 73 mL/min
GFR, Est Non African American: 63 mL/min
GLUCOSE: 83 mg/dL (ref 70–99)
Potassium: 4.3 mEq/L (ref 3.5–5.3)
Sodium: 138 mEq/L (ref 135–145)

## 2013-09-29 LAB — CBC
HCT: 44 % (ref 39.0–52.0)
HEMOGLOBIN: 14.9 g/dL (ref 13.0–17.0)
MCH: 33.9 pg (ref 26.0–34.0)
MCHC: 33.9 g/dL (ref 30.0–36.0)
MCV: 100.2 fL — ABNORMAL HIGH (ref 78.0–100.0)
Platelets: 344 10*3/uL (ref 150–400)
RBC: 4.39 MIL/uL (ref 4.22–5.81)
RDW: 13.7 % (ref 11.5–15.5)
WBC: 7.7 10*3/uL (ref 4.0–10.5)

## 2013-09-29 LAB — TSH: TSH: 1.539 u[IU]/mL (ref 0.350–4.500)

## 2013-09-29 MED ORDER — IOHEXOL 350 MG/ML SOLN
100.0000 mL | Freq: Once | INTRAVENOUS | Status: AC | PRN
  Administered 2013-09-29: 100 mL via INTRAVENOUS

## 2013-10-06 ENCOUNTER — Ambulatory Visit (INDEPENDENT_AMBULATORY_CARE_PROVIDER_SITE_OTHER): Payer: Medicare Other | Admitting: Surgery

## 2013-10-06 DIAGNOSIS — Z7901 Long term (current) use of anticoagulants: Secondary | ICD-10-CM

## 2013-10-06 DIAGNOSIS — I4891 Unspecified atrial fibrillation: Secondary | ICD-10-CM

## 2013-10-06 DIAGNOSIS — Z5181 Encounter for therapeutic drug level monitoring: Secondary | ICD-10-CM | POA: Diagnosis not present

## 2013-10-06 DIAGNOSIS — I480 Paroxysmal atrial fibrillation: Secondary | ICD-10-CM

## 2013-10-06 LAB — POCT INR: INR: 2

## 2013-10-07 ENCOUNTER — Encounter: Payer: Self-pay | Admitting: Internal Medicine

## 2013-10-07 ENCOUNTER — Ambulatory Visit (INDEPENDENT_AMBULATORY_CARE_PROVIDER_SITE_OTHER): Payer: Medicare Other | Admitting: *Deleted

## 2013-10-07 DIAGNOSIS — I4729 Other ventricular tachycardia: Secondary | ICD-10-CM

## 2013-10-07 DIAGNOSIS — I472 Ventricular tachycardia: Secondary | ICD-10-CM | POA: Diagnosis not present

## 2013-10-07 NOTE — Progress Notes (Signed)
Remote ICD transmission.   

## 2013-10-11 ENCOUNTER — Telehealth: Payer: Self-pay

## 2013-10-11 DIAGNOSIS — Z23 Encounter for immunization: Secondary | ICD-10-CM | POA: Diagnosis not present

## 2013-10-11 MED ORDER — ALBUTEROL SULFATE HFA 108 (90 BASE) MCG/ACT IN AERS: 2.0000 | INHALATION_SPRAY | Freq: Four times a day (QID) | RESPIRATORY_TRACT | Status: DC | PRN

## 2013-10-11 NOTE — Telephone Encounter (Signed)
Refill request for ventolin hfa 90 mcg inhaler qty 18 grams w/ 2 rfs, rx done and sent to pharmacy

## 2013-10-13 ENCOUNTER — Other Ambulatory Visit: Payer: Self-pay | Admitting: Internal Medicine

## 2013-10-14 LAB — MDC_IDC_ENUM_SESS_TYPE_REMOTE
Brady Statistic RV Percent Paced: 100 %
HighPow Impedance: 51 Ohm
Implantable Pulse Generator Serial Number: 492371
Lead Channel Impedance Value: 756 Ohm
Lead Channel Impedance Value: 821 Ohm
Lead Channel Setting Pacing Amplitude: 2 V
Lead Channel Setting Pacing Amplitude: 2.5 V
Lead Channel Setting Pacing Pulse Width: 0.4 ms
Lead Channel Setting Pacing Pulse Width: 0.5 ms
Lead Channel Setting Sensing Sensitivity: 0.5 mV
MDC IDC MSMT LEADCHNL RA IMPEDANCE VALUE: 573 Ohm
MDC IDC SET LEADCHNL LV SENSING SENSITIVITY: 1 mV
MDC IDC SET LEADCHNL RV PACING AMPLITUDE: 2.5 V
MDC IDC SET ZONE DETECTION INTERVAL: 333 ms
MDC IDC STAT BRADY RA PERCENT PACED: 99 %
Zone Setting Detection Interval: 300 ms
Zone Setting Detection Interval: 600 ms

## 2013-11-02 ENCOUNTER — Encounter: Payer: Self-pay | Admitting: Cardiology

## 2013-11-09 ENCOUNTER — Ambulatory Visit (INDEPENDENT_AMBULATORY_CARE_PROVIDER_SITE_OTHER): Payer: Medicare Other | Admitting: Pulmonary Disease

## 2013-11-09 ENCOUNTER — Encounter: Payer: Self-pay | Admitting: Pulmonary Disease

## 2013-11-09 VITALS — BP 122/70 | HR 60 | Temp 98.1°F | Ht 71.0 in | Wt 232.6 lb

## 2013-11-09 DIAGNOSIS — G4733 Obstructive sleep apnea (adult) (pediatric): Secondary | ICD-10-CM | POA: Diagnosis not present

## 2013-11-09 DIAGNOSIS — I2589 Other forms of chronic ischemic heart disease: Secondary | ICD-10-CM | POA: Diagnosis not present

## 2013-11-09 MED ORDER — FLUTICASONE PROPIONATE 50 MCG/ACT NA SUSP: NASAL | Status: DC

## 2013-11-09 MED ORDER — IPRATROPIUM BROMIDE 0.06 % NA SOLN: 2.0000 | Freq: Every day | NASAL | Status: DC

## 2013-11-09 NOTE — Progress Notes (Signed)
   Subjective:    Patient ID: [name], male    DOB: 1940-08-28, 73 y.o.   MRN: [id number]  HPI Patient comes in today for follow-up of his known obstructive sleep apnea. He has been very compliant with his device in the past, but has been having issues recently with severe dryness which is interfering with his use. He is on a lot of medications which can cause dryness, but it is unclear if he has turned his heat up on his C Pap device adequately. He is also having some issues with mask leaking, and is interested in trying different types.   Review of Systems  Constitutional: Negative for fever and unexpected weight change.  HENT: Negative for congestion, dental problem, ear pain, nosebleeds, postnasal drip, rhinorrhea, sinus pressure, sneezing, sore throat and trouble swallowing.   Eyes: Negative for redness and itching.  Respiratory: Negative for cough, chest tightness, shortness of breath and wheezing.   Cardiovascular: Negative for palpitations and leg swelling.  Gastrointestinal: Negative for nausea and vomiting.  Genitourinary: Negative for dysuria.  Musculoskeletal: Negative for joint swelling.  Skin: Negative for rash.  Neurological: Negative for headaches.  Hematological: Does not bruise/bleed easily.  Psychiatric/Behavioral: Negative for dysphoric mood. The patient is not nervous/anxious.        Objective:   Physical Exam Overweight male in no acute distress Nose without purulence or discharge noted No skin breakdown or pressure necrosis from the C Pap mask Neck without lymphadenopathy or thyromegaly Lower extremities with minimal edema, no cyanosis Alert and oriented, moves all 4 extremities.       Assessment & Plan:

## 2013-11-09 NOTE — Addendum Note (Signed)
Addended by: Lilli Few on: 11/09/2013 02:03 PM   Modules accepted: Orders

## 2013-11-09 NOTE — Patient Instructions (Signed)
Will arrange for a fitting session at the sleep center for a new mask Increase heat on your humidifier, and make sure the water level goes almost all of the way down the next day. Talk with your primary doctor about some of your medications that may be drying you out Will refill your nasal meds, (atrovent/flonase) Will see you back in one year if you are doing well.

## 2013-11-09 NOTE — Assessment & Plan Note (Signed)
The patient has been very compliant with his device in the past, but now is having issues with severe dryness. Part of this is related to his allergy medications and other meds, but I have asked him to make adjustments to his heated humidifier and make sure the water level is being used. We'll also refer him to the sleep center for a mask fitting session since he is having issues with leak

## 2013-11-16 ENCOUNTER — Ambulatory Visit (HOSPITAL_BASED_OUTPATIENT_CLINIC_OR_DEPARTMENT_OTHER): Payer: Medicare Other | Attending: Pulmonary Disease | Admitting: Radiology

## 2013-11-16 ENCOUNTER — Telehealth: Payer: Self-pay | Admitting: Pulmonary Disease

## 2013-11-16 DIAGNOSIS — G4733 Obstructive sleep apnea (adult) (pediatric): Secondary | ICD-10-CM

## 2013-11-16 DIAGNOSIS — Z9989 Dependence on other enabling machines and devices: Secondary | ICD-10-CM

## 2013-11-16 NOTE — Telephone Encounter (Signed)
Called spoke with [facility]. He reports pt is currently on bilevel 15/10. Pt reports he thinks [place] thinks he is on CPAP that is set at 16. Pt reports he likes the bilevel device better than CPAP. He wasn't sure if [facility] was aware of this or not. Please advise thanks

## 2013-11-17 ENCOUNTER — Ambulatory Visit (INDEPENDENT_AMBULATORY_CARE_PROVIDER_SITE_OTHER): Payer: Medicare Other | Admitting: Pharmacist

## 2013-11-17 DIAGNOSIS — I480 Paroxysmal atrial fibrillation: Secondary | ICD-10-CM | POA: Diagnosis not present

## 2013-11-17 DIAGNOSIS — I4891 Unspecified atrial fibrillation: Secondary | ICD-10-CM | POA: Diagnosis not present

## 2013-11-17 DIAGNOSIS — Z5181 Encounter for therapeutic drug level monitoring: Secondary | ICD-10-CM | POA: Diagnosis not present

## 2013-11-17 DIAGNOSIS — Z7901 Long term (current) use of anticoagulants: Secondary | ICD-10-CM

## 2013-11-17 LAB — POCT INR: INR: 2

## 2013-11-22 NOTE — Telephone Encounter (Signed)
I knew he was on bilevel, just used term cpap in my last note, but overview documents current treatment regimen.

## 2013-11-24 NOTE — Telephone Encounter (Signed)
lmtcb for [employer].

## 2013-11-25 NOTE — Telephone Encounter (Signed)
lmtcb x 2 for [employer]

## 2013-11-29 NOTE — Telephone Encounter (Signed)
Called, spoke with Lynnae Sandhoff with Sleep Lab. Discussed below per [facility].  He verbalized understanding and voiced no further questions or concerns at this time.

## 2013-12-10 ENCOUNTER — Encounter: Payer: Self-pay | Admitting: Physician Assistant

## 2013-12-10 ENCOUNTER — Ambulatory Visit (INDEPENDENT_AMBULATORY_CARE_PROVIDER_SITE_OTHER): Payer: Medicare Other | Admitting: Physician Assistant

## 2013-12-10 VITALS — BP 118/78 | HR 67 | Temp 97.7°F | Wt 231.0 lb

## 2013-12-10 DIAGNOSIS — J00 Acute nasopharyngitis [common cold]: Secondary | ICD-10-CM | POA: Diagnosis not present

## 2013-12-10 DIAGNOSIS — J208 Acute bronchitis due to other specified organisms: Principal | ICD-10-CM

## 2013-12-10 DIAGNOSIS — I2589 Other forms of chronic ischemic heart disease: Secondary | ICD-10-CM | POA: Diagnosis not present

## 2013-12-10 DIAGNOSIS — B9689 Other specified bacterial agents as the cause of diseases classified elsewhere: Secondary | ICD-10-CM | POA: Insufficient documentation

## 2013-12-10 MED ORDER — AMOXICILLIN 875 MG PO TABS: 875.0000 mg | ORAL_TABLET | Freq: Two times a day (BID) | ORAL | Status: DC

## 2013-12-10 NOTE — Progress Notes (Signed)
Pre visit review using our clinic review tool, if applicable. No additional management support is needed unless otherwise documented below in the visit note.

## 2013-12-10 NOTE — Assessment & Plan Note (Signed)
Rx Amoxicillin.  Increase fluids. Rest. Plain Mucinex.  Saline nasal spray.  Place humidifier in bedroom. Continue inhaler as directed if needed.  Return precautions discussed with patient.

## 2013-12-10 NOTE — Progress Notes (Signed)
Patient presents to clinic today c/o worsening cough and congestion x 10 days. Patient endorses cough productive of clear sputum that has turned greenish brown over the past couple of days.  Denies fever, chills, SOB or pleuritic chest pain.  Endorses mild sore throat secondary to coughing.  Denies recent travel or sick contact.  Has recently been diagnosed with borderline COPD. Has had to use albuterol inhaler some over the past couple of days.  Past Medical History  Diagnosis Date  . Atrial fibrillation     Ablation for a-flutter 05/2000  . Cardiomyopathy     Ischemic.  EF 20%  . Hypertension   . Hyperlipidemia   . Allergic rhinitis   . Obstructive sleep apnea     CPAP 16  . CAD (coronary artery disease)     PTCA RCA 1997 (Inf/post MI);  [name] Ventricular tachycardia     ICD, pacer  . Adenomatous colon polyp 2009; 06/2012    Colonoscopy 07/2007---Dr. Barron Schmid GI--Repeat 06/2012 showed one tubular adenoma w/out high grade dysplasia.  . Kidney stones     No distal stones on CT 2010; stable 8 mm RLP stone, 69m LMP stone 10/2013 plain film.  . Myocardial infarction   . Microscopic hematuria     Cysto neg 02/2009 except for BPH  . Diverticulosis of colon 2005    Noted on Colonoscopies in 2005 and 2009 and on CT 2010  . Internal hemorrhoids     Colonoscopy 07/2007  . Hepatic steatosis 2010    Noted on CT abd 2010 and on u/s abd 04/2009 (transaminasemia)  . Allergic rhinitis   . BPH (benign prostatic hypertrophy)     PSAs ok (followed by urologist)  . History of impaired glucose tolerance   . Shortness of breath   . CHF (congestive heart failure)   . Pacemaker     pacermaker /ICD/CRT---LV lead replacement 12/2012.  F/u echo showed mild improvement in mitral regurg but EF still 10%.  . Paronychia of right thumb 08/22/2012    Current Outpatient Prescriptions on File Prior to Visit  Medication Sig Dispense Refill  . albuterol (VENTOLIN HFA) 108 (90 BASE) MCG/ACT inhaler Inhale 2 puffs  into the lungs every 6 (six) hours as needed for shortness of breath. 18 g 2  . amiodarone (PACERONE) 200 MG tablet Take 1 tablet (200 mg total) by mouth daily. 90 tablet 2  . carvedilol (COREG) 12.5 MG tablet Take 1 tablet (12.5 mg total) by mouth 2 (two) times daily with a meal. 180 tablet 3  . Cholecalciferol (VITAMIN D PO) Take 3,000 mcg by mouth daily.    . Efinaconazole (JUBLIA) 10 % SOLN Apply topically.    .Marland Kitcheneplerenone (INSPRA) 25 MG tablet Take 1 tablet (25 mg total) by mouth daily. 90 tablet 3  . fluticasone (FLONASE) 50 MCG/ACT nasal spray INSTILL 2 SPRAYS INTO EACH NOSTRIL TWICE A DAY 8 g 11  . furosemide (LASIX) 40 MG tablet Take 2 tablets (80 mg total) by mouth daily. 180 tablet 1  . ipratropium (ATROVENT) 0.06 % nasal spray Place 2 sprays into the nose at bedtime. 15 mL 11  . KLOR-CON M20 20 MEQ tablet TAKE 1 TABLET (20 MEQ TOTAL) BY MOUTH DAILY. 90 tablet 2  . mexiletine (MEXITIL) 150 MG capsule Take 2 capsules (300 mg total) by mouth 2 (two) times daily. 360 capsule 3  . Multiple Vitamin (MULTIVITAMIN WITH MINERALS) TABS Take 1 tablet by mouth daily.    . nitroGLYCERIN (NITROSTAT)  0.4 MG SL tablet Place 1 tablet (0.4 mg total) under the tongue every 5 (five) minutes as needed for chest pain. 25 tablet 12  . pantoprazole (PROTONIX) 40 MG tablet Take 40 mg by mouth 2 (two) times daily.     . polyvinyl alcohol-povidone (REFRESH) 1.4-0.6 % ophthalmic solution Place 1-2 drops into both eyes as needed.    . Psyllium (METAMUCIL PO) Take by mouth. 3 teaspoon a day    . valsartan (DIOVAN) 40 MG tablet Take 1 tablet (40 mg total) by mouth daily. 90 tablet 3  . warfarin (COUMADIN) 5 MG tablet Take 1 tablet (5 mg total) by mouth one time only at 6 PM. 90 tablet 3  . levocetirizine (XYZAL) 5 MG tablet Take 1 tablet (5 mg total) by mouth every evening. 30 tablet 10   No current facility-administered medications on file prior to visit.    Allergies  Allergen Reactions  . Sulfa Antibiotics  Rash    Family History  Problem Relation Age of Onset  . Heart disease Mother   . Cancer Maternal Grandmother     breast  . Coronary artery disease Other     family hx of  . Hyperlipidemia Other     family hx of  . Heart attack Father     4 stents/ pacemaker  . Cancer Father     prostate  . Hypertension Sister   . Goiter Paternal Grandmother   . Nephrolithiasis Son     History   Social History  . Marital Status: Married    Spouse Name: N/A    Number of Children: N/A  . Years of Education: N/A   Occupational History  . Retired    Social History Main Topics  . Smoking status: Former Smoker -- 2.00 packs/day for 15 years    Types: Cigarettes    Quit date: 01/15/1980  . Smokeless tobacco: Never Used  . Alcohol Use: Yes     Comment: couple glasses week  . Drug Use: No  . Sexual Activity: None   Other Topics Concern  . None   Social History Narrative   Married.   Hx of cigarettes: quit in the 1980s.  No alc/drugs.   Review of Systems - See HPI.  All other ROS are negative.  BP 118/78 mmHg  Pulse 67  Temp(Src) 97.7 F (36.5 C) (Oral)  Wt 231 lb (104.781 kg)  SpO2 96%  Physical Exam  Constitutional: He is oriented to person, place, and time and well-developed, well-nourished, and in no distress.  HENT:  Head: Normocephalic and atraumatic.  Right Ear: External ear normal.  Left Ear: External ear normal.  Nose: Nose normal.  Mouth/Throat: Oropharynx is clear and moist. No oropharyngeal exudate.  TM within normal limits bilaterally.  Eyes: Conjunctivae are normal.  Neck: Neck supple.  Cardiovascular: Normal rate, regular rhythm, normal heart sounds and intact distal pulses.   Pulmonary/Chest: Effort normal and breath sounds normal. No respiratory distress. He has no wheezes. He has no rales. He exhibits no tenderness.  Lymphadenopathy:    He has no cervical adenopathy.  Neurological: He is alert and oriented to person, place, and time.  Skin: Skin is  warm and dry. No rash noted.  Psychiatric: Affect normal.  Vitals reviewed.   Recent Results (from the past 2160 hour(s))  BMP with Estimated GFR ([id number])     Status: None   Collection Time: 09/28/13  3:02 PM  Result Value Ref Range   Sodium 138 135 -  145 mEq/L   Potassium 4.3 3.5 - 5.3 mEq/L   Chloride 102 96 - 112 mEq/L   CO2 25 19 - 32 mEq/L   Glucose, Bld 83 70 - 99 mg/dL   BUN 19 6 - 23 mg/dL   Creat 1.15 0.50 - 1.35 mg/dL   Calcium 9.5 8.4 - 10.5 mg/dL   GFR, Est African American 73 mL/min   GFR, Est Non African American 63 mL/min    Comment:   The estimated GFR is a calculation valid for adults (>=29 years old) that uses the CKD-EPI algorithm to adjust for age and sex. It is   not to be used for children, pregnant women, hospitalized patients,    patients on dialysis, or with rapidly changing kidney function. According to the NKDEP, eGFR >89 is normal, 60-89 shows mild impairment, 30-59 shows moderate impairment, 15-29 shows severe impairment and <15 is ESRD.    CBC     Status: Abnormal   Collection Time: 09/28/13  3:02 PM  Result Value Ref Range   WBC 7.7 4.0 - 10.5 K/uL   RBC 4.39 4.22 - 5.81 MIL/uL   Hemoglobin 14.9 13.0 - 17.0 g/dL   HCT 44.0 39.0 - 52.0 %   MCV 100.2 (H) 78.0 - 100.0 fL   MCH 33.9 26.0 - 34.0 pg   MCHC 33.9 30.0 - 36.0 g/dL   RDW 13.7 11.5 - 15.5 %   Platelets 344 150 - 400 K/uL  TSH     Status: None   Collection Time: 09/28/13  3:02 PM  Result Value Ref Range   TSH 1.539 0.350 - 4.500 uIU/mL  Hepatic function panel     Status: Abnormal   Collection Time: 09/28/13  3:02 PM  Result Value Ref Range   Total Bilirubin 0.7 0.2 - 1.2 mg/dL   Bilirubin, Direct 0.1 0.0 - 0.3 mg/dL   Indirect Bilirubin 0.6 0.2 - 1.2 mg/dL   Alkaline Phosphatase 66 39 - 117 U/L   AST 40 (H) 0 - 37 U/L   ALT 41 0 - 53 U/L   Total Protein 7.0 6.0 - 8.3 g/dL   Albumin 4.1 3.5 - 5.2 g/dL  POCT INR     Status: None   Collection Time: 10/06/13 12:25 PM  Result  Value Ref Range   INR 2.0   Implantable device - remote     Status: None   Collection Time: 10/14/13 11:37 AM  Result Value Ref Range   Pulse Generator Manufacturer [employer]    Pulse Gen Model N119 COGNIS 100-D    Pulse Gen Serial Number [id number]    LV Sense Sensitivity 1 mV   LV Adaptation Mode Adaptive Sensing    RV Sense Sensitivity 0.5 mV   RV Adaptation Mode Adaptive Sensing    LV Pace PulseWidth 0.5 ms   LV Pace Amplitude 2.5 V   RA Pace Amplitude 2 V   RV Pace PulseWidth 0.4 ms   RV Pace Amplitude 2.5 V   Zone Setting Type Category VF    Zone Detect Interval 300 ms   Zone Setting Type Category VT    Zone Detect Interval 333 ms   Zone Setting Type Category VENTRICULAR_TACHYCARDIA_1    Zone Detect Interval 600 ms   RA Impedance 573 ohm   RV IMPEDANCE 756 ohm   HighPow Impedance 51 ohm   LV Impedance 821 ohm   Brady RA Perc Paced 99 %   Brady RV Perc Paced 100 %  Eval Rhythm Ap/BiVp    Miscellaneous Comment      Remote CRT-D device check. Lead impedance trends stable over time. No mode switch episodes recorded. 4 NSVT---1 new since previous Latitude---4 beats. Patient bi-ventricularly pacing 100% of the time. Device programmed with appropriate safety margins.  Estimated longevity 6.63yr. ROV w/ SK 01/04/14.  POCT INR     Status: None   Collection Time: 11/17/13 12:24 PM  Result Value Ref Range   INR 2.0     Assessment/Plan: Acute bacterial bronchitis Rx Amoxicillin.  Increase fluids. Rest. Plain Mucinex.  Saline nasal spray.  Place humidifier in bedroom. Continue inhaler as directed if needed.  Return precautions discussed with patient.

## 2013-12-10 NOTE — Patient Instructions (Signed)
Please take antibiotic as directed.  Stay well hydrated.  Use plain Mucinex for chest congestion.  Continue albuterol inhaler as directed, if needed for chest tightness. Place a humidifier in your bedroom and get plenty of rest.  Call your PMD (Dr. Ernestine Conrad) or myself if symptoms have not improved by Monday.  My number is [phone].  Acute Bronchitis Bronchitis is inflammation of the airways that extend from the windpipe into the lungs (bronchi). The inflammation often causes mucus to develop. This leads to a cough, which is the most common symptom of bronchitis.  In acute bronchitis, the condition usually develops suddenly and goes away over time, usually in a couple weeks. Smoking, allergies, and asthma can make bronchitis worse. Repeated episodes of bronchitis may cause further lung problems.  CAUSES Acute bronchitis is most often caused by the same virus that causes a cold. The virus can spread from person to person (contagious) through coughing, sneezing, and touching contaminated objects. SIGNS AND SYMPTOMS   Cough.   Fever.   Coughing up mucus.   Body aches.   Chest congestion.   Chills.   Shortness of breath.   Sore throat.  DIAGNOSIS  Acute bronchitis is usually diagnosed through a physical exam. Your health care provider will also ask you questions about your medical history. Tests, such as chest X-rays, are sometimes done to rule out other conditions.  TREATMENT  Acute bronchitis usually goes away in a couple weeks. Oftentimes, no medical treatment is necessary. Medicines are sometimes given for relief of fever or cough. Antibiotic medicines are usually not needed but may be prescribed in certain situations. In some cases, an inhaler may be recommended to help reduce shortness of breath and control the cough. A cool mist vaporizer may also be used to help thin bronchial secretions and make it easier to clear the chest.  HOME CARE INSTRUCTIONS  Get plenty of rest.    Drink enough fluids to keep your urine clear or pale yellow (unless you have a medical condition that requires fluid restriction). Increasing fluids may help thin your respiratory secretions (sputum) and reduce chest congestion, and it will prevent dehydration.   Take medicines only as directed by your health care provider.  If you were prescribed an antibiotic medicine, finish it all even if you start to feel better.  Avoid smoking and secondhand smoke. Exposure to cigarette smoke or irritating chemicals will make bronchitis worse. If you are a smoker, consider using nicotine gum or skin patches to help control withdrawal symptoms. Quitting smoking will help your lungs heal faster.   Reduce the chances of another bout of acute bronchitis by washing your hands frequently, avoiding people with cold symptoms, and trying not to touch your hands to your mouth, nose, or eyes.   Keep all follow-up visits as directed by your health care provider.  SEEK MEDICAL CARE IF: Your symptoms do not improve after 1 week of treatment.  SEEK IMMEDIATE MEDICAL CARE IF:  You develop an increased fever or chills.   You have chest pain.   You have severe shortness of breath.  You have bloody sputum.   You develop dehydration.  You faint or repeatedly feel like you are going to pass out.  You develop repeated vomiting.  You develop a severe headache. MAKE SURE YOU:   Understand these instructions.  Will watch your condition.  Will get help right away if you are not doing well or get worse. Document Released: 02/08/2004 Document Revised: 05/17/2013 Document Reviewed: 06/23/2012  ExitCare Patient Information 2015 [place]. This information is not intended to replace advice given to you by your health care provider. Make sure you discuss any questions you have with your health care provider.

## 2013-12-23 ENCOUNTER — Encounter (HOSPITAL_COMMUNITY): Payer: Self-pay | Admitting: Internal Medicine

## 2013-12-31 ENCOUNTER — Ambulatory Visit (INDEPENDENT_AMBULATORY_CARE_PROVIDER_SITE_OTHER): Payer: Medicare Other

## 2013-12-31 DIAGNOSIS — Z7901 Long term (current) use of anticoagulants: Secondary | ICD-10-CM

## 2013-12-31 DIAGNOSIS — Z5181 Encounter for therapeutic drug level monitoring: Secondary | ICD-10-CM

## 2013-12-31 DIAGNOSIS — I4891 Unspecified atrial fibrillation: Secondary | ICD-10-CM | POA: Diagnosis not present

## 2013-12-31 LAB — POCT INR: INR: 2.2

## 2014-01-04 ENCOUNTER — Ambulatory Visit (INDEPENDENT_AMBULATORY_CARE_PROVIDER_SITE_OTHER): Payer: Medicare Other | Admitting: Internal Medicine

## 2014-01-04 ENCOUNTER — Encounter: Payer: Self-pay | Admitting: Internal Medicine

## 2014-01-04 VITALS — BP 116/78 | HR 60 | Ht 71.0 in | Wt 231.0 lb

## 2014-01-04 DIAGNOSIS — I255 Ischemic cardiomyopathy: Secondary | ICD-10-CM

## 2014-01-04 DIAGNOSIS — I2589 Other forms of chronic ischemic heart disease: Secondary | ICD-10-CM

## 2014-01-04 DIAGNOSIS — Z4502 Encounter for adjustment and management of automatic implantable cardiac defibrillator: Secondary | ICD-10-CM | POA: Diagnosis not present

## 2014-01-04 DIAGNOSIS — I5022 Chronic systolic (congestive) heart failure: Secondary | ICD-10-CM | POA: Diagnosis not present

## 2014-01-04 DIAGNOSIS — I480 Paroxysmal atrial fibrillation: Secondary | ICD-10-CM | POA: Diagnosis not present

## 2014-01-04 DIAGNOSIS — I4729 Other ventricular tachycardia: Secondary | ICD-10-CM

## 2014-01-04 DIAGNOSIS — I472 Ventricular tachycardia: Secondary | ICD-10-CM | POA: Diagnosis not present

## 2014-01-04 LAB — MDC_IDC_ENUM_SESS_TYPE_INCLINIC
Date Time Interrogation Session: 20151222050000
HighPow Impedance: 38 Ohm
HighPow Impedance: 51 Ohm
Implantable Pulse Generator Serial Number: 492371
Lead Channel Impedance Value: 793 Ohm
Lead Channel Pacing Threshold Amplitude: 1.2 V
Lead Channel Pacing Threshold Amplitude: 1.9 V
Lead Channel Pacing Threshold Pulse Width: 0.4 ms
Lead Channel Sensing Intrinsic Amplitude: 3.7 mV
Lead Channel Sensing Intrinsic Amplitude: 7.8 mV
Lead Channel Setting Pacing Amplitude: 2 V
Lead Channel Setting Pacing Amplitude: 2.5 V
Lead Channel Setting Pacing Pulse Width: 0.4 ms
Lead Channel Setting Pacing Pulse Width: 0.5 ms
Lead Channel Setting Sensing Sensitivity: 1 mV
MDC IDC MSMT LEADCHNL LV PACING THRESHOLD PULSEWIDTH: 0.5 ms
MDC IDC MSMT LEADCHNL RA IMPEDANCE VALUE: 577 Ohm
MDC IDC MSMT LEADCHNL RA PACING THRESHOLD AMPLITUDE: 0.7 V
MDC IDC MSMT LEADCHNL RA PACING THRESHOLD PULSEWIDTH: 0.3 ms
MDC IDC MSMT LEADCHNL RA SENSING INTR AMPL: 6.1 mV
MDC IDC MSMT LEADCHNL RV IMPEDANCE VALUE: 700 Ohm
MDC IDC SET LEADCHNL LV PACING AMPLITUDE: 2.5 V
MDC IDC SET LEADCHNL RV SENSING SENSITIVITY: 0.5 mV
MDC IDC SET ZONE DETECTION INTERVAL: 300 ms
MDC IDC SET ZONE DETECTION INTERVAL: 333 ms
Zone Setting Detection Interval: 600 ms

## 2014-01-04 NOTE — Progress Notes (Signed)
Patient Care Team: [name], MD as PCP - General (Family Medicine) Festus Aloe, MD as Consulting Physician (Urology) Kathee Delton, MD as Consulting Physician (Pulmonary Disease) Jeryl Columbia, MD as Consulting Physician (Gastroenterology) Candee Furbish, MD as Consulting Physician (Cardiology) Michael Boston, MD as Consulting Physician (General Surgery)   HPI  [name] is a 73 y.o. male Seen in followup for congestive heart failure in the setting of ischemic heart disease and previously implanted CRT-D with subsequent left ventricular lead macro dislodgment. His symptoms however been quite stable. When he saw Dr. Lovena Le concerning lead extraction and lead implantation he was doing as well as he had for some time and it was decided not to pursue it then.  He also has ventricular tachycardia for which he takes amiodarone. LFTs were abnormal and his Crestor was stopped. Repeat LFTs remained elevated when they were last checked in August.  Last cardiac catheterization in October 2008 showed an occluded right coronary artery with left-to-right collateralization. Ejection fraction is 20%. There was no other coronary disease noted. Last Myoview in June of 2010 showed prior inferior infarct but no ischemia. His ejection fraction was 21%previous carotid Dopplers in April 2006 showed 0-39% stenosis bilaterally. Previous abdominal ultrasound in April 2006 showed no aneurysm.  Because of symptoms of congestive heart failure he underwent CRT upgrade by Dr. Elliot Cousin with subsequent extraction of the previously implanted failed LV lead.   Initially, he was not impressed at improvement. Nor was his wife. We undertook AV optimization and this is been associated with a vast improvement in his functional status  He has not had chest pain. It is not have edema. He does have ongoing issues of fatigue and shortness of breath the former of which were mildly ameliorated by himself down titration of his  valsartan    Past Medical History  Diagnosis Date  . Atrial fibrillation     Ablation for a-flutter 05/2000  . Cardiomyopathy     Ischemic.  EF 20%  . Hypertension   . Hyperlipidemia   . Allergic rhinitis   . Obstructive sleep apnea     CPAP 16  . CAD (coronary artery disease)     PTCA RCA 1997 (Inf/post MI);  [name] Ventricular tachycardia     ICD, pacer  . Adenomatous colon polyp 2009; 06/2012    Colonoscopy 07/2007---Dr. Barron Schmid GI--Repeat 06/2012 showed one tubular adenoma w/out high grade dysplasia.  . Kidney stones     No distal stones on CT 2010; stable 8 mm RLP stone, 33m LMP stone 10/2013 plain film.  . Myocardial infarction   . Microscopic hematuria     Cysto neg 02/2009 except for BPH  . Diverticulosis of colon 2005    Noted on Colonoscopies in 2005 and 2009 and on CT 2010  . Internal hemorrhoids     Colonoscopy 07/2007  . Hepatic steatosis 2010    Noted on CT abd 2010 and on u/s abd 04/2009 (transaminasemia)  . Allergic rhinitis   . BPH (benign prostatic hypertrophy)     PSAs ok (followed by urologist)  . History of impaired glucose tolerance   . Shortness of breath   . CHF (congestive heart failure)   . Pacemaker     pacermaker /ICD/CRT---LV lead replacement 12/2012.  F/u echo showed mild improvement in mitral regurg but EF still 10%.  . Paronychia of right thumb 08/22/2012    Past Surgical History  Procedure Laterality Date  . Icd  a) Guidant Contak H170- Virl Axe, MD 07/25/06 (second device) b) Medtronic CRT-D  . Breast surgery  1990    "Tissue removed from breast"  . Umbilical hernia repair    . Hernia repair  1975    bilateral inguinal  . Extracorporeal shock wave lithotripsy    . Atrial ablation surgery    . Cardiac catheterization    . Esophagogastroduodenoscopy N/A 07/13/2012    Procedure: ESOPHAGOGASTRODUODENOSCOPY (EGD);  Surgeon: Jeryl Columbia, MD;  Location: [facility];  Service: Endoscopy;  Laterality: N/A;  NORMAL  . Colonoscopy N/A  07/13/2012    Procedure: COLONOSCOPY;  Surgeon: Jeryl Columbia, MD;  Location: [facility];  Service: Endoscopy;  Laterality: N/A; polypectomy x 1   . Carotid doppler  09/25/12    No signif stenosis  . Lead revision Bilateral 12/26/2011    Procedure: LEAD REVISION;  Surgeon: Evans Lance, MD;  Location: [facility] CATH LAB;  Service: Cardiovascular;  Laterality: Bilateral;    Current Outpatient Prescriptions  Medication Sig Dispense Refill  . albuterol (VENTOLIN HFA) 108 (90 BASE) MCG/ACT inhaler Inhale 2 puffs into the lungs every 6 (six) hours as needed for shortness of breath. 18 g 2  . amiodarone (PACERONE) 200 MG tablet Take 1 tablet (200 mg total) by mouth daily. 90 tablet 2  . carvedilol (COREG) 12.5 MG tablet Take 1 tablet (12.5 mg total) by mouth 2 (two) times daily with a meal. 180 tablet 3  . Cholecalciferol (VITAMIN D PO) Take 3,000 mcg by mouth daily.    [name] eplerenone (INSPRA) 25 MG tablet Take 1 tablet (25 mg total) by mouth daily. 90 tablet 3  . fluticasone (FLONASE) 50 MCG/ACT nasal spray INSTILL 2 SPRAYS INTO EACH NOSTRIL TWICE A DAY 8 g 11  . furosemide (LASIX) 40 MG tablet Take 2 tablets (80 mg total) by mouth daily. 180 tablet 1  . ipratropium (ATROVENT) 0.06 % nasal spray Place 2 sprays into the nose at bedtime. 15 mL 11  . KLOR-CON M20 20 MEQ tablet TAKE 1 TABLET (20 MEQ TOTAL) BY MOUTH DAILY. 90 tablet 2  . levocetirizine (XYZAL) 5 MG tablet Take 1 tablet by mouth every morning.  6  . mexiletine (MEXITIL) 150 MG capsule Take 2 capsules (300 mg total) by mouth 2 (two) times daily. 360 capsule 3  . Multiple Vitamin (MULTIVITAMIN WITH MINERALS) TABS Take 1 tablet by mouth daily.    . nitroGLYCERIN (NITROSTAT) 0.4 MG SL tablet Place 1 tablet (0.4 mg total) under the tongue every 5 (five) minutes as needed for chest pain. 25 tablet 12  . pantoprazole (PROTONIX) 40 MG tablet Take 40 mg by mouth 2 (two) times daily.     . polyvinyl alcohol-povidone (REFRESH) 1.4-0.6 % ophthalmic  solution Place 1-2 drops into both eyes as needed (dry eyes).     . Psyllium (METAMUCIL PO) Take 3 teaspoons by mouth a day    . valsartan (DIOVAN) 40 MG tablet Take 1 tablet (40 mg total) by mouth daily. 90 tablet 3  . warfarin (COUMADIN) 5 MG tablet Take 1 tablet (5 mg total) by mouth one time only at 6 PM. 90 tablet 3   No current facility-administered medications for this visit.    Allergies  Allergen Reactions  . Sulfa Antibiotics Rash    Review of Systems negative except from HPI and PMH  Physical Exam BP 116/78 mmHg  Pulse 60  Ht _0  (1.803 m)  Wt 231 lb (104.781 kg)  BMI 32.23 kg/m2  Well developed and well nourished in no acute distress HENT normal E scleral and icterus clear Neck Supple JVP flat; carotids brisk and full Clear to ausculation  Device pocket well healed; without hematoma or erythema.  There is no tethering Regular rate and rhythm, no murmurs gallops or rub Soft with active bowel sounds No clubbing cyanosis none  Edema Alert and oriented, grossly normal motor and sensory function Skin Warm and Dry    Assessment and  Plan\  Ventricular tachycardia  Ischemic cardiomyopathy  Congestive heart failure-chronic-systolic  CRT-D-[employer]  Atrial fibrillation  Overall he is doing great. He is euvolemic. There is no symptoms of ischemia. No intercurrent ventricular tachycardia. Continue current medications.

## 2014-01-04 NOTE — Patient Instructions (Addendum)
Your physician recommends that you continue on your current medications as directed. Please refer to the Current Medication list given to you today.  Remote monitoring is used to monitor your ICD from home. This monitoring reduces the number of office visits required to check your device to one time per year. It allows [place] to keep an eye on the functioning of your device to ensure it is working properly. You are scheduled for a device check from home on 04-05-2014. You may send your transmission at any time that day. If you have a wireless device, the transmission will be sent automatically. After your physician reviews your transmission, you will receive a postcard with your next transmission date.  Your physician recommends that you schedule a follow-up appointment in: [place]

## 2014-01-06 ENCOUNTER — Other Ambulatory Visit: Payer: Self-pay | Admitting: Pulmonary Disease

## 2014-01-10 ENCOUNTER — Other Ambulatory Visit: Payer: Self-pay | Admitting: Internal Medicine

## 2014-01-13 ENCOUNTER — Other Ambulatory Visit: Payer: Self-pay | Admitting: Pulmonary Disease

## 2014-01-18 ENCOUNTER — Other Ambulatory Visit: Payer: Self-pay | Admitting: Internal Medicine

## 2014-01-18 ENCOUNTER — Other Ambulatory Visit: Payer: Self-pay | Admitting: Emergency Medicine

## 2014-01-20 ENCOUNTER — Other Ambulatory Visit: Payer: Self-pay | Admitting: *Deleted

## 2014-01-20 MED ORDER — FUROSEMIDE 40 MG PO TABS: 80.0000 mg | ORAL_TABLET | Freq: Every day | ORAL | Status: DC

## 2014-02-08 ENCOUNTER — Ambulatory Visit (INDEPENDENT_AMBULATORY_CARE_PROVIDER_SITE_OTHER): Payer: Medicare Other | Admitting: *Deleted

## 2014-02-08 DIAGNOSIS — Z5181 Encounter for therapeutic drug level monitoring: Secondary | ICD-10-CM | POA: Diagnosis not present

## 2014-02-08 DIAGNOSIS — Z7901 Long term (current) use of anticoagulants: Secondary | ICD-10-CM | POA: Diagnosis not present

## 2014-02-08 DIAGNOSIS — I480 Paroxysmal atrial fibrillation: Secondary | ICD-10-CM | POA: Diagnosis not present

## 2014-02-08 DIAGNOSIS — I4891 Unspecified atrial fibrillation: Secondary | ICD-10-CM

## 2014-02-08 LAB — POCT INR: INR: 1.6

## 2014-02-22 ENCOUNTER — Ambulatory Visit (INDEPENDENT_AMBULATORY_CARE_PROVIDER_SITE_OTHER): Payer: Medicare Other

## 2014-02-22 DIAGNOSIS — I4891 Unspecified atrial fibrillation: Secondary | ICD-10-CM

## 2014-02-22 DIAGNOSIS — Z7901 Long term (current) use of anticoagulants: Secondary | ICD-10-CM

## 2014-02-22 DIAGNOSIS — Z5181 Encounter for therapeutic drug level monitoring: Secondary | ICD-10-CM | POA: Diagnosis not present

## 2014-02-22 LAB — POCT INR: INR: 1.7

## 2014-03-02 DIAGNOSIS — R312 Other microscopic hematuria: Secondary | ICD-10-CM | POA: Diagnosis not present

## 2014-03-02 DIAGNOSIS — N400 Enlarged prostate without lower urinary tract symptoms: Secondary | ICD-10-CM | POA: Diagnosis not present

## 2014-03-02 DIAGNOSIS — N200 Calculus of kidney: Secondary | ICD-10-CM | POA: Diagnosis not present

## 2014-03-08 ENCOUNTER — Ambulatory Visit (INDEPENDENT_AMBULATORY_CARE_PROVIDER_SITE_OTHER): Payer: Medicare Other | Admitting: *Deleted

## 2014-03-08 DIAGNOSIS — Z5181 Encounter for therapeutic drug level monitoring: Secondary | ICD-10-CM

## 2014-03-08 DIAGNOSIS — I4891 Unspecified atrial fibrillation: Secondary | ICD-10-CM

## 2014-03-08 DIAGNOSIS — Z7901 Long term (current) use of anticoagulants: Secondary | ICD-10-CM

## 2014-03-08 LAB — POCT INR: INR: 1.8

## 2014-03-09 ENCOUNTER — Encounter: Payer: Self-pay | Admitting: Family Medicine

## 2014-03-09 DIAGNOSIS — Z125 Encounter for screening for malignant neoplasm of prostate: Secondary | ICD-10-CM | POA: Insufficient documentation

## 2014-03-10 ENCOUNTER — Other Ambulatory Visit: Payer: Self-pay | Admitting: Emergency Medicine

## 2014-03-16 ENCOUNTER — Other Ambulatory Visit: Payer: Self-pay | Admitting: Cardiology

## 2014-03-17 ENCOUNTER — Other Ambulatory Visit: Payer: Self-pay | Admitting: *Deleted

## 2014-03-17 ENCOUNTER — Other Ambulatory Visit: Payer: Self-pay

## 2014-03-17 DIAGNOSIS — Z95810 Presence of automatic (implantable) cardiac defibrillator: Secondary | ICD-10-CM

## 2014-03-17 DIAGNOSIS — I059 Rheumatic mitral valve disease, unspecified: Secondary | ICD-10-CM

## 2014-03-17 DIAGNOSIS — I4891 Unspecified atrial fibrillation: Secondary | ICD-10-CM

## 2014-03-17 DIAGNOSIS — I480 Paroxysmal atrial fibrillation: Secondary | ICD-10-CM

## 2014-03-17 DIAGNOSIS — I429 Cardiomyopathy, unspecified: Secondary | ICD-10-CM

## 2014-03-17 MED ORDER — NITROGLYCERIN 0.4 MG SL SUBL: 0.4000 mg | SUBLINGUAL_TABLET | SUBLINGUAL | Status: DC | PRN

## 2014-03-17 MED ORDER — CARVEDILOL 12.5 MG PO TABS: 12.5000 mg | ORAL_TABLET | Freq: Two times a day (BID) | ORAL | Status: DC

## 2014-03-17 MED ORDER — FUROSEMIDE 40 MG PO TABS: 80.0000 mg | ORAL_TABLET | Freq: Every day | ORAL | Status: DC

## 2014-03-22 ENCOUNTER — Ambulatory Visit (INDEPENDENT_AMBULATORY_CARE_PROVIDER_SITE_OTHER): Payer: Medicare Other | Admitting: *Deleted

## 2014-03-22 DIAGNOSIS — I4891 Unspecified atrial fibrillation: Secondary | ICD-10-CM

## 2014-03-22 DIAGNOSIS — Z7901 Long term (current) use of anticoagulants: Secondary | ICD-10-CM

## 2014-03-22 DIAGNOSIS — Z5181 Encounter for therapeutic drug level monitoring: Secondary | ICD-10-CM

## 2014-03-22 LAB — POCT INR: INR: 1.9

## 2014-04-05 ENCOUNTER — Ambulatory Visit (INDEPENDENT_AMBULATORY_CARE_PROVIDER_SITE_OTHER): Payer: Medicare Other | Admitting: *Deleted

## 2014-04-05 DIAGNOSIS — I472 Ventricular tachycardia, unspecified: Secondary | ICD-10-CM

## 2014-04-05 LAB — MDC_IDC_ENUM_SESS_TYPE_REMOTE
Battery Remaining Longevity: 72 mo
Brady Statistic RA Percent Paced: 100 %
Date Time Interrogation Session: 20160322045900
HIGH POWER IMPEDANCE MEASURED VALUE: 50 Ohm
Implantable Pulse Generator Serial Number: 492371
Lead Channel Impedance Value: 566 Ohm
Lead Channel Impedance Value: 852 Ohm
Lead Channel Pacing Threshold Amplitude: 1.2 V
Lead Channel Pacing Threshold Amplitude: 1.9 V
Lead Channel Pacing Threshold Pulse Width: 0.4 ms
Lead Channel Setting Pacing Amplitude: 2 V
Lead Channel Setting Pacing Amplitude: 2.5 V
Lead Channel Setting Pacing Pulse Width: 0.4 ms
Lead Channel Setting Pacing Pulse Width: 0.5 ms
Lead Channel Setting Sensing Sensitivity: 0.5 mV
MDC IDC MSMT BATTERY REMAINING PERCENTAGE: 100 %
MDC IDC MSMT LEADCHNL LV PACING THRESHOLD PULSEWIDTH: 0.5 ms
MDC IDC MSMT LEADCHNL RA PACING THRESHOLD AMPLITUDE: 0.7 V
MDC IDC MSMT LEADCHNL RA PACING THRESHOLD PULSEWIDTH: 0.3 ms
MDC IDC MSMT LEADCHNL RV IMPEDANCE VALUE: 663 Ohm
MDC IDC SET LEADCHNL LV PACING AMPLITUDE: 2.5 V
MDC IDC SET LEADCHNL LV SENSING SENSITIVITY: 1 mV
MDC IDC STAT BRADY RV PERCENT PACED: 100 %
Zone Setting Detection Interval: 300 ms
Zone Setting Detection Interval: 333 ms
Zone Setting Detection Interval: 600 ms

## 2014-04-05 NOTE — Progress Notes (Signed)
Remote ICD transmission.

## 2014-04-06 ENCOUNTER — Ambulatory Visit (INDEPENDENT_AMBULATORY_CARE_PROVIDER_SITE_OTHER): Payer: Medicare Other | Admitting: *Deleted

## 2014-04-06 ENCOUNTER — Other Ambulatory Visit: Payer: Self-pay | Admitting: Internal Medicine

## 2014-04-06 DIAGNOSIS — Z7901 Long term (current) use of anticoagulants: Secondary | ICD-10-CM | POA: Diagnosis not present

## 2014-04-06 DIAGNOSIS — I4891 Unspecified atrial fibrillation: Secondary | ICD-10-CM | POA: Diagnosis not present

## 2014-04-06 DIAGNOSIS — Z5181 Encounter for therapeutic drug level monitoring: Secondary | ICD-10-CM | POA: Diagnosis not present

## 2014-04-06 LAB — POCT INR: INR: 3

## 2014-04-18 NOTE — Progress Notes (Signed)
HPI: [name] is a pleasant gentleman with a history of coronary artery disease, mixed ischemic and nonischemic cardiomyopathy, history of ICD, paroxysmal atrial fibrillation, and ventricular tachycardia. Previous carotid Dopplers in Sept 2014 showed 0-39% stenosis bilaterally. Previous abdominal ultrasound in April 2006 showed no aneurysm. Last cardiac catheterization in October 2008 showed an occluded right coronary artery with left-to-right collateralization. Ejection fraction is 20%. There was no other coronary disease noted. Last Myoview in Dec 2011 showed prior inferior infarct but no ischemia. His ejection fraction was 19%. ABIs in December of 2011 were normal. An echocardiogram in January 2015 showed an ejection fraction of 20%. There was dilatation of the aortic root measuring 45 mm. There was mild mitral regurgitation and mild left atrial enlargement. There was mild right atrial enlargement. The patient has had upgrade of his device to CRT-D. Patient had revision of his left ventricular lead in December 2013. Chest CT September 2015 showed no aneurysm. Since last seen, he has some fatigue. No dyspnea, chest pain, palpitations or syncope.  Current Outpatient Prescriptions  Medication Sig Dispense Refill  . albuterol (VENTOLIN HFA) 108 (90 BASE) MCG/ACT inhaler Inhale 2 puffs into the lungs every 6 (six) hours as needed for shortness of breath. 18 g 2  . amiodarone (PACERONE) 200 MG tablet Take 1 tablet (200 mg total) by mouth daily. 90 tablet 2  . carvedilol (COREG) 12.5 MG tablet Take 1 tablet (12.5 mg total) by mouth 2 (two) times daily with a meal. 180 tablet 1  . Cholecalciferol (VITAMIN D PO) Take 3,000 mcg by mouth daily.    [name] eplerenone (INSPRA) 25 MG tablet Take 1 tablet (25 mg total) by mouth daily. 90 tablet 3  . fluticasone (FLONASE) 50 MCG/ACT nasal spray INSTILL 2 SPRAYS INTO EACH NOSTRIL TWICE A DAY 8 g 11  . furosemide (LASIX) 40 MG tablet Take 2 tablets (80 mg total) by  mouth daily. 180 tablet 0  . ipratropium (ATROVENT) 0.06 % nasal spray Place 2 sprays into the nose at bedtime. 15 mL 11  . KLOR-CON M20 20 MEQ tablet TAKE 1 TABLET (20 MEQ TOTAL) BY MOUTH DAILY. 90 tablet 2  . levocetirizine (XYZAL) 5 MG tablet Take 1 tablet by mouth daily.  5  . Magnesium 250 MG TABS Take 1 tablet by mouth daily.    [name] mexiletine (MEXITIL) 150 MG capsule Take 2 capsules (300 mg total) by mouth 2 (two) times daily. 360 capsule 3  . Multiple Vitamin (MULTIVITAMIN WITH MINERALS) TABS Take 1 tablet by mouth daily.    . nitroGLYCERIN (NITROSTAT) 0.4 MG SL tablet Place 1 tablet (0.4 mg total) under the tongue every 5 (five) minutes as needed for chest pain. 25 tablet 3  . polyvinyl alcohol-povidone (REFRESH) 1.4-0.6 % ophthalmic solution Place 1-2 drops into both eyes as needed (dry eyes).     . Psyllium (METAMUCIL PO) Take 3 teaspoons by mouth a day    . valsartan (DIOVAN) 40 MG tablet Take 1 tablet (40 mg total) by mouth daily. 90 tablet 3  . warfarin (COUMADIN) 5 MG tablet Take 1 tablet (5 mg total) by mouth one time only at 6 PM. 90 tablet 3   No current facility-administered medications for this visit.     Past Medical History  Diagnosis Date  . Atrial fibrillation     Ablation for a-flutter 05/2000  . Cardiomyopathy     Ischemic.  EF 20%  . Hypertension   . Hyperlipidemia   .  Allergic rhinitis   . Obstructive sleep apnea     CPAP 16  . CAD (coronary artery disease)     PTCA RCA 1997 (Inf/post MI);  [name] Ventricular tachycardia     ICD, pacer  . Adenomatous colon polyp 2009; 06/2012    Colonoscopy 07/2007---Dr. Barron Schmid GI--Repeat 06/2012 showed one tubular adenoma w/out high grade dysplasia.  . Nephrolithiasis     No distal stones on CT 2010; stable 8 mm RLP stone, 86m LMP stone 10/2013 plain film.  . Myocardial infarction   . Microscopic hematuria     Cysto neg 02/2009 except for BPH  . Diverticulosis of colon 2005    Noted on Colonoscopies in 2005 and 2009 and  on CT 2010  . Internal hemorrhoids     Colonoscopy 07/2007  . Hepatic steatosis 2010    Noted on CT abd 2010 and on u/s abd 04/2009 (transaminasemia)  . Allergic rhinitis   . BPH (benign prostatic hypertrophy)     PSAs ok (followed by urologist): no LUTS  . History of impaired glucose tolerance   . Shortness of breath   . CHF (congestive heart failure)   . Pacemaker     pacermaker /ICD/CRT---LV lead replacement 12/2012.  F/u echo showed mild improvement in mitral regurg but EF still 10%.  . Paronychia of right thumb 08/22/2012    Past Surgical History  Procedure Laterality Date  . Icd      a) Guidant Contak H170- SVirl Axe MD 07/25/06 (second device) b) Medtronic CRT-D  . Breast surgery  1990    "Tissue removed from breast"  . Umbilical hernia repair    . Hernia repair  1975    bilateral inguinal  . Extracorporeal shock wave lithotripsy    . Atrial ablation surgery    . Cardiac catheterization    . Esophagogastroduodenoscopy N/A 07/13/2012    Procedure: ESOPHAGOGASTRODUODENOSCOPY (EGD);  Surgeon: MJeryl Columbia MD;  [facility];  Service: Endoscopy;  Laterality: N/A;  NORMAL  . Colonoscopy N/A 07/13/2012    Procedure: COLONOSCOPY;  Surgeon: MJeryl Columbia MD;  Location: [facility];  Service: Endoscopy;  Laterality: N/A; polypectomy x 1   . Carotid doppler  09/25/12    No signif stenosis  . Lead revision Bilateral 12/26/2011    Procedure: LEAD REVISION;  Surgeon: GEvans Lance MD;  [facility];  Service: Cardiovascular;  Laterality: Bilateral;    History   Social History  . Marital Status: Married    Spouse Name: N/A  . Number of Children: N/A  . Years of Education: N/A   Occupational History  . Retired    Social History Main Topics  . Smoking status: Former Smoker -- 2.00 packs/day for 15 years    Types: Cigarettes    Quit date: 01/15/1980  . Smokeless tobacco: Never Used  . Alcohol Use: Yes     Comment: couple glasses week  . Drug Use: No    . Sexual Activity: Not on file   Other Topics Concern  . Not on file   Social History Narrative   Married.   Hx of cigarettes: quit in the 1980s.  No alc/drugs.    ROS: no fevers or chills, productive cough, hemoptysis, dysphasia, odynophagia, melena, hematochezia, dysuria, hematuria, rash, seizure activity, orthopnea, PND, pedal edema, claudication. Remaining systems are negative.  Physical Exam: Well-developed well-nourished in no acute distress.  Skin is warm and dry.  HEENT is normal.  Neck is supple.  Chest is  clear to auscultation with normal expansion.  Cardiovascular exam is regular rate and rhythm.  Abdominal exam nontender or distended. No masses palpated. Extremities show no edema. neuro grossly intact  ECG AV paced.

## 2014-04-19 ENCOUNTER — Ambulatory Visit (INDEPENDENT_AMBULATORY_CARE_PROVIDER_SITE_OTHER): Payer: Medicare Other | Admitting: Cardiology

## 2014-04-19 ENCOUNTER — Ambulatory Visit (INDEPENDENT_AMBULATORY_CARE_PROVIDER_SITE_OTHER)
Admission: RE | Admit: 2014-04-19 | Discharge: 2014-04-19 | Disposition: A | Discharge status: Home / Self Care | Payer: Medicare Other | Source: Ambulatory Visit | Attending: Cardiology | Admitting: Cardiology

## 2014-04-19 ENCOUNTER — Encounter: Payer: Self-pay | Admitting: Cardiology

## 2014-04-19 ENCOUNTER — Encounter (INDEPENDENT_AMBULATORY_CARE_PROVIDER_SITE_OTHER): Payer: Medicare Other | Admitting: *Deleted

## 2014-04-19 VITALS — BP 116/78 | HR 60 | Ht 70.5 in | Wt 231.2 lb

## 2014-04-19 DIAGNOSIS — I2510 Atherosclerotic heart disease of native coronary artery without angina pectoris: Secondary | ICD-10-CM | POA: Diagnosis not present

## 2014-04-19 DIAGNOSIS — Z5181 Encounter for therapeutic drug level monitoring: Secondary | ICD-10-CM

## 2014-04-19 DIAGNOSIS — Z7901 Long term (current) use of anticoagulants: Secondary | ICD-10-CM

## 2014-04-19 DIAGNOSIS — I5022 Chronic systolic (congestive) heart failure: Secondary | ICD-10-CM

## 2014-04-19 DIAGNOSIS — I4729 Other ventricular tachycardia: Secondary | ICD-10-CM

## 2014-04-19 DIAGNOSIS — I2583 Coronary atherosclerosis due to lipid rich plaque: Principal | ICD-10-CM

## 2014-04-19 DIAGNOSIS — I4891 Unspecified atrial fibrillation: Secondary | ICD-10-CM

## 2014-04-19 DIAGNOSIS — Z0000 Encounter for general adult medical examination without abnormal findings: Secondary | ICD-10-CM | POA: Diagnosis not present

## 2014-04-19 DIAGNOSIS — I472 Ventricular tachycardia: Secondary | ICD-10-CM

## 2014-04-19 DIAGNOSIS — I10 Essential (primary) hypertension: Secondary | ICD-10-CM | POA: Diagnosis not present

## 2014-04-19 DIAGNOSIS — I517 Cardiomegaly: Secondary | ICD-10-CM | POA: Diagnosis not present

## 2014-04-19 LAB — HEPATIC FUNCTION PANEL
ALK PHOS: 60 U/L (ref 39–117)
ALT: 38 U/L (ref 0–53)
AST: 36 U/L (ref 0–37)
Albumin: 4.3 g/dL (ref 3.5–5.2)
BILIRUBIN DIRECT: 0.1 mg/dL (ref 0.0–0.3)
BILIRUBIN INDIRECT: 0.7 mg/dL (ref 0.2–1.2)
TOTAL PROTEIN: 7 g/dL (ref 6.0–8.3)
Total Bilirubin: 0.8 mg/dL (ref 0.2–1.2)

## 2014-04-19 LAB — CBC
HCT: 45.9 % (ref 39.0–52.0)
Hemoglobin: 15.6 g/dL (ref 13.0–17.0)
MCH: 34.6 pg — ABNORMAL HIGH (ref 26.0–34.0)
MCHC: 34 g/dL (ref 30.0–36.0)
MCV: 101.8 fL — ABNORMAL HIGH (ref 78.0–100.0)
MPV: 9.4 fL (ref 8.6–12.4)
Platelets: 352 10*3/uL (ref 150–400)
RBC: 4.51 MIL/uL (ref 4.22–5.81)
RDW: 13.3 % (ref 11.5–15.5)
WBC: 7.1 10*3/uL (ref 4.0–10.5)

## 2014-04-19 LAB — BASIC METABOLIC PANEL WITH GFR
BUN: 17 mg/dL (ref 6–23)
CHLORIDE: 100 meq/L (ref 96–112)
CO2: 26 mEq/L (ref 19–32)
Calcium: 9 mg/dL (ref 8.4–10.5)
Creat: 1 mg/dL (ref 0.50–1.35)
GFR, EST AFRICAN AMERICAN: 87 mL/min
GFR, Est Non African American: 75 mL/min
GLUCOSE: 90 mg/dL (ref 70–99)
Potassium: 4.8 mEq/L (ref 3.5–5.3)
SODIUM: 138 meq/L (ref 135–145)

## 2014-04-19 LAB — TSH: TSH: 2.282 u[IU]/mL (ref 0.350–4.500)

## 2014-04-19 LAB — POCT INR: INR: 2.4

## 2014-04-19 NOTE — Assessment & Plan Note (Signed)
Not on aspirin given need for anticoagulation. Statin discontinued previously because of increased liver functions.

## 2014-04-19 NOTE — Assessment & Plan Note (Signed)
Continue beta blocker and amiodarone and Coumadin. Check hemoglobin, renal function, TSH, liver functions and chest x-ray. Patient provided the name of NOACS; he will contact us if he would like to change from Coumadin.

## 2014-04-19 NOTE — Progress Notes (Signed)
  This encounter was created in error - please disregard. This encounter was created in error - please disregard.

## 2014-04-19 NOTE — Assessment & Plan Note (Signed)
Continue present medications. 

## 2014-04-19 NOTE — Patient Instructions (Signed)
Your physician wants you to follow-up in: [place] will receive a reminder letter in the mail two months in advance. If you don't receive a letter, please call our office to schedule the follow-up appointment.   Your physician recommends that you HAVE LAB WORK TODAY  A chest x-ray takes a picture of the organs and structures inside the chest, including the heart, lungs, and blood vessels. This test can show several things, including, whether the heart is enlarges; whether fluid is building up in the lungs; and whether pacemaker / defibrillator leads are still in place.

## 2014-04-19 NOTE — Assessment & Plan Note (Signed)
Euvolemic. Continue present dose of diuretics. Check potassium and renal function.

## 2014-04-19 NOTE — Assessment & Plan Note (Signed)
Continue diet. Statin discontinued because of increased liver functions.

## 2014-04-19 NOTE — Assessment & Plan Note (Signed)
Followed by electrophysiology. 

## 2014-04-19 NOTE — Assessment & Plan Note (Signed)
Continue ARB and beta blocker.

## 2014-04-19 NOTE — Assessment & Plan Note (Signed)
Blood pressure controlled. Continue present medications. 

## 2014-04-20 ENCOUNTER — Encounter: Payer: Self-pay | Admitting: Cardiology

## 2014-04-20 ENCOUNTER — Ambulatory Visit: Payer: Medicare Other

## 2014-04-27 ENCOUNTER — Encounter: Payer: Self-pay | Admitting: Internal Medicine

## 2014-04-29 ENCOUNTER — Telehealth: Payer: Self-pay | Admitting: *Deleted

## 2014-04-29 ENCOUNTER — Ambulatory Visit: Payer: Self-pay | Admitting: Cardiology

## 2014-04-29 DIAGNOSIS — L821 Other seborrheic keratosis: Secondary | ICD-10-CM | POA: Diagnosis not present

## 2014-04-29 DIAGNOSIS — Z5181 Encounter for therapeutic drug level monitoring: Secondary | ICD-10-CM

## 2014-04-29 DIAGNOSIS — L570 Actinic keratosis: Secondary | ICD-10-CM | POA: Diagnosis not present

## 2014-04-29 DIAGNOSIS — B351 Tinea unguium: Secondary | ICD-10-CM | POA: Diagnosis not present

## 2014-04-29 DIAGNOSIS — I4891 Unspecified atrial fibrillation: Secondary | ICD-10-CM

## 2014-04-29 DIAGNOSIS — L812 Freckles: Secondary | ICD-10-CM | POA: Diagnosis not present

## 2014-04-29 MED ORDER — RIVAROXABAN 20 MG PO TABS: 20.0000 mg | ORAL_TABLET | Freq: Every day | ORAL | Status: DC

## 2014-04-29 NOTE — Telephone Encounter (Signed)
Spoke with pt, he would like to change to xarelto. Per dr Stanford Breed, patient will stop the coumadin and then 2 days laster he will start xarelto 20 mg once daily with food. He will have lab work in 4 weeks, orders mailed to the patient

## 2014-04-29 NOTE — Telephone Encounter (Signed)
Coumadin discontinued as Xarelto has been started

## 2014-04-29 NOTE — Telephone Encounter (Signed)
-----  Message from Cristopher Estimable, RN sent at 04/29/2014  4:33 PM EDT ----- Warfarin stopped and xarelto started

## 2014-05-12 ENCOUNTER — Telehealth: Payer: Self-pay | Admitting: Cardiology

## 2014-05-12 NOTE — Telephone Encounter (Signed)
New message         Pt needs orders for lab   pt been on xarelto for almost 4 weeks

## 2014-05-12 NOTE — Telephone Encounter (Signed)
Call turned over to Fredia Beets RN for resolution  Of requested labs

## 2014-05-12 NOTE — Telephone Encounter (Signed)
Called pt LMTCB.

## 2014-05-17 DIAGNOSIS — I4891 Unspecified atrial fibrillation: Secondary | ICD-10-CM | POA: Diagnosis not present

## 2014-05-18 LAB — CBC
HCT: 43.8 % (ref 39.0–52.0)
HEMOGLOBIN: 14.8 g/dL (ref 13.0–17.0)
MCH: 34.6 pg — ABNORMAL HIGH (ref 26.0–34.0)
MCHC: 33.8 g/dL (ref 30.0–36.0)
MCV: 102.3 fL — ABNORMAL HIGH (ref 78.0–100.0)
MPV: 9.8 fL (ref 8.6–12.4)
PLATELETS: 318 10*3/uL (ref 150–400)
RBC: 4.28 MIL/uL (ref 4.22–5.81)
RDW: 13.3 % (ref 11.5–15.5)
WBC: 8.7 10*3/uL (ref 4.0–10.5)

## 2014-05-18 LAB — BASIC METABOLIC PANEL
BUN: 18 mg/dL (ref 6–23)
CHLORIDE: 104 meq/L (ref 96–112)
CO2: 25 meq/L (ref 19–32)
CREATININE: 0.87 mg/dL (ref 0.50–1.35)
Calcium: 9 mg/dL (ref 8.4–10.5)
Glucose, Bld: 100 mg/dL — ABNORMAL HIGH (ref 70–99)
POTASSIUM: 4.2 meq/L (ref 3.5–5.3)
Sodium: 137 mEq/L (ref 135–145)

## 2014-05-28 ENCOUNTER — Other Ambulatory Visit: Payer: Self-pay | Admitting: Emergency Medicine

## 2014-05-29 ENCOUNTER — Other Ambulatory Visit: Payer: Self-pay | Admitting: Cardiology

## 2014-05-30 NOTE — Telephone Encounter (Signed)
Rx has been sent to the pharmacy electronically.

## 2014-05-31 ENCOUNTER — Other Ambulatory Visit: Payer: Self-pay | Admitting: Pulmonary Disease

## 2014-06-15 ENCOUNTER — Ambulatory Visit (INDEPENDENT_AMBULATORY_CARE_PROVIDER_SITE_OTHER): Payer: Medicare Other | Admitting: Family Medicine

## 2014-06-15 ENCOUNTER — Telehealth: Payer: Self-pay | Admitting: Family Medicine

## 2014-06-15 ENCOUNTER — Encounter: Payer: Self-pay | Admitting: Family Medicine

## 2014-06-15 VITALS — BP 110/76 | HR 59 | Temp 98.0°F | Resp 16 | Wt 227.0 lb

## 2014-06-15 DIAGNOSIS — R109 Unspecified abdominal pain: Secondary | ICD-10-CM

## 2014-06-15 DIAGNOSIS — Z23 Encounter for immunization: Secondary | ICD-10-CM | POA: Diagnosis not present

## 2014-06-15 DIAGNOSIS — I2510 Atherosclerotic heart disease of native coronary artery without angina pectoris: Secondary | ICD-10-CM | POA: Diagnosis not present

## 2014-06-15 DIAGNOSIS — R1013 Epigastric pain: Secondary | ICD-10-CM

## 2014-06-15 DIAGNOSIS — K5730 Diverticulosis of large intestine without perforation or abscess without bleeding: Secondary | ICD-10-CM

## 2014-06-15 DIAGNOSIS — K5732 Diverticulitis of large intestine without perforation or abscess without bleeding: Secondary | ICD-10-CM | POA: Diagnosis not present

## 2014-06-15 DIAGNOSIS — IMO0001 Reserved for inherently not codable concepts without codable children: Secondary | ICD-10-CM

## 2014-06-15 DIAGNOSIS — K8020 Calculus of gallbladder without cholecystitis without obstruction: Secondary | ICD-10-CM

## 2014-06-15 DIAGNOSIS — R143 Flatulence: Secondary | ICD-10-CM

## 2014-06-15 HISTORY — DX: Calculus of gallbladder without cholecystitis without obstruction: K80.20

## 2014-06-15 LAB — URINALYSIS, ROUTINE W REFLEX MICROSCOPIC
BILIRUBIN URINE: NEGATIVE
KETONES UR: NEGATIVE
Leukocytes, UA: NEGATIVE
Nitrite: NEGATIVE
Specific Gravity, Urine: 1.02 (ref 1.000–1.030)
Total Protein, Urine: NEGATIVE
URINE GLUCOSE: NEGATIVE
UROBILINOGEN UA: 0.2 (ref 0.0–1.0)
pH: 5.5 (ref 5.0–8.0)

## 2014-06-15 LAB — COMPREHENSIVE METABOLIC PANEL
ALT: 37 U/L (ref 0–53)
AST: 34 U/L (ref 0–37)
Albumin: 4.4 g/dL (ref 3.5–5.2)
Alkaline Phosphatase: 55 U/L (ref 39–117)
BUN: 16 mg/dL (ref 6–23)
CO2: 29 mEq/L (ref 19–32)
CREATININE: 1.06 mg/dL (ref 0.40–1.50)
Calcium: 9.8 mg/dL (ref 8.4–10.5)
Chloride: 101 mEq/L (ref 96–112)
GFR: 72.84 mL/min (ref >60.00)
GLUCOSE: 91 mg/dL (ref 70–99)
Potassium: 4.3 mEq/L (ref 3.5–5.1)
SODIUM: 137 meq/L (ref 135–145)
TOTAL PROTEIN: 7.2 g/dL (ref 6.0–8.3)
Total Bilirubin: 0.9 mg/dL (ref 0.2–1.2)

## 2014-06-15 LAB — CBC WITH DIFFERENTIAL/PLATELET
BASOS ABS: 0 10*3/uL (ref 0.0–0.1)
Basophils Relative: 0.5 % (ref 0.0–3.0)
Eosinophils Absolute: 0.5 10*3/uL (ref 0.0–0.7)
Eosinophils Relative: 6.3 % — ABNORMAL HIGH (ref 0.0–5.0)
HCT: 47.2 % (ref 39.0–52.0)
HEMOGLOBIN: 15.9 g/dL (ref 13.0–17.0)
Lymphocytes Relative: 30.1 % (ref 12.0–46.0)
Lymphs Abs: 2.5 10*3/uL (ref 0.7–4.0)
MCHC: 33.8 g/dL (ref 30.0–36.0)
MCV: 102.7 fl — ABNORMAL HIGH (ref 78.0–100.0)
Monocytes Absolute: 0.8 10*3/uL (ref 0.1–1.0)
Monocytes Relative: 9.6 % (ref 3.0–12.0)
NEUTROS ABS: 4.4 10*3/uL (ref 1.4–7.7)
NEUTROS PCT: 53.5 % (ref 43.0–77.0)
PLATELETS: 354 10*3/uL (ref 150.0–400.0)
RBC: 4.6 Mil/uL (ref 4.22–5.81)
RDW: 13.7 % (ref 11.5–15.5)
WBC: 8.2 10*3/uL (ref 4.0–10.5)

## 2014-06-15 LAB — H. PYLORI ANTIBODY, IGG: H Pylori IgG: NEGATIVE

## 2014-06-15 LAB — LIPASE: Lipase: 11 U/L (ref 11.0–59.0)

## 2014-06-15 LAB — SEDIMENTATION RATE: Sed Rate: 14 mm/hr (ref 0–22)

## 2014-06-15 MED ORDER — AMOXICILLIN-POT CLAVULANATE ER 1000-62.5 MG PO TB12: 2.0000 | ORAL_TABLET | Freq: Two times a day (BID) | ORAL | Status: DC

## 2014-06-15 MED ORDER — FLUTICASONE PROPIONATE 50 MCG/ACT NA SUSP: NASAL | Status: DC

## 2014-06-15 NOTE — Telephone Encounter (Signed)
Pharmacy called and had a question in regards to Jamis's Augmentin rx. Please call at [phone]

## 2014-06-15 NOTE — Telephone Encounter (Signed)
Spoke to pharmacy and they wanted to clarify if the qty was suppose to be #[phone]. Please advise. Thanks.

## 2014-06-15 NOTE — Progress Notes (Signed)
Pre visit review using our clinic review tool, if applicable. No additional management support is needed unless otherwise documented below in the visit note.

## 2014-06-15 NOTE — Addendum Note (Signed)
Addended by: [name] on: 06/15/2014 06:15 PM   Modules accepted: Orders

## 2014-06-15 NOTE — Progress Notes (Addendum)
OFFICE VISIT  06/15/2014   CC:  Chief Complaint  Patient presents with  . Abdominal Pain    x 6-7 months, with excessive burping at night     HPI:    Patient is a 74 y.o. Caucasian male who presents for abd pain.   Says he has about 1 mo hx of pain right in center of abdomen over umbillicus, lots of burping at night. Describes some intermittent L and then R groin pains over the last 6 mo, nothing persistent--this seems to have gone away lately, "replaced" by the current peri-umbillical pain. These groin pains were fleeting and he denies seeing any bulging in the regions.  No n/v.  Some constipation, chronic. His symptoms occur commonly after he gets in bed at night.  Takes tums and it helps.  Burping relieves most of the pain. Dietary changes lately: more ham/cheese/lettuce/tomato sandwiches since getting xarelto, also more peanut butter sandwiches. Takes a lot of meds around bedtime (chronically), also was recently started on xarelto.    Denies melena or hematochezia.  Has chronic constipation but says it is better with metamucil tid. No fevers, no abnormal wt loss, no loss of appetite.  No flank pain, no gross hematuria, no dysuria.  The abd pain does not radiate into back or chest or groin.  He feels like he has noted a bit of asymmetry to the abd wall of late: right side protrudes just a bit more than left.  Has hx of tiny umbillical hernia but says nothing has ever poked out through this defect.  Past Medical History  Diagnosis Date  . Atrial fibrillation     Ablation for a-flutter 05/2000  . Cardiomyopathy     Ischemic.  EF 20%  . Hypertension   . Hyperlipidemia   . Allergic rhinitis   . Obstructive sleep apnea     CPAP 16  . CAD (coronary artery disease)     PTCA RCA 1997 (Inf/post MI);  [name] Ventricular tachycardia     ICD, pacer  . Adenomatous colon polyp 2009; 06/2012    Colonoscopy 07/2007---Dr. Barron [facility]--Repeat 06/2012 showed one tubular adenoma w/out high grade  dysplasia.  . Nephrolithiasis     No distal stones on CT 2010; stable 8 mm RLP stone, 38m LMP stone 10/2013 plain film.  . Myocardial infarction   . Microscopic hematuria     Cysto neg 02/2009 except for BPH  . Diverticulosis of colon 2005    Noted on Colonoscopies in 2005 and 2009 and on CT 2010  . Internal hemorrhoids     Colonoscopy 07/2007  . Hepatic steatosis 2010    Noted on CT abd 2010 and on u/s abd 04/2009 (transaminasemia)  . Allergic rhinitis   . BPH (benign prostatic hypertrophy)     PSAs ok (followed by urologist): no LUTS  . History of impaired glucose tolerance   . Shortness of breath   . CHF (congestive heart failure)   . Pacemaker     pacermaker /ICD/CRT---LV lead replacement 12/2012.  F/u echo showed mild improvement in mitral regurg but EF still 10%.  . Paronychia of right thumb 08/22/2012    Past Surgical History  Procedure Laterality Date  . Icd      a) Guidant Contak H170- SVirl Axe MD 07/25/06 (second device) b) Medtronic CRT-D  . Breast surgery  1990    "Tissue removed from breast"  . Umbilical hernia repair    . Hernia repair  1975    bilateral inguinal  .  Extracorporeal shock wave lithotripsy    . Atrial ablation surgery    . Cardiac catheterization    . Esophagogastroduodenoscopy N/A 07/13/2012    Procedure: ESOPHAGOGASTRODUODENOSCOPY (EGD);  Surgeon: Jeryl Columbia, MD;  Location: [facility];  Service: Endoscopy;  Laterality: N/A;  NORMAL  . Colonoscopy N/A 07/13/2012    Diverticula ascending colon.  Polyp x 1-recall 5 yrs (Dr. Watt Climes) Procedure: COLONOSCOPY;  Surgeon: Jeryl Columbia, MD;  Location: [facility];  Service: Endoscopy;  Laterality: N/A; polypectomy x 1   . Carotid doppler  09/25/12    No signif stenosis  . Lead revision Bilateral 12/26/2011    Procedure: LEAD REVISION;  Surgeon: Evans Lance, MD;  Location: [facility] CATH LAB;  Service: Cardiovascular;  Laterality: Bilateral;    Outpatient Prescriptions Prior to Visit  Medication Sig  Dispense Refill  . albuterol (VENTOLIN HFA) 108 (90 BASE) MCG/ACT inhaler Inhale 2 puffs into the lungs every 6 (six) hours as needed for shortness of breath. 18 g 2  . amiodarone (PACERONE) 200 MG tablet Take 1 tablet (200 mg total) by mouth daily. 90 tablet 2  . carvedilol (COREG) 12.5 MG tablet Take 1 tablet (12.5 mg total) by mouth 2 (two) times daily with a meal. 180 tablet 1  . Cholecalciferol (VITAMIN D PO) Take 3,000 mcg by mouth daily.    [name] eplerenone (INSPRA) 25 MG tablet Take 1 tablet (25 mg total) by mouth daily. 90 tablet 3  . furosemide (LASIX) 40 MG tablet TAKE 2 TABLETS BY MOUTH EVERY DAY 180 tablet 1  . ipratropium (ATROVENT) 0.06 % nasal spray Place 2 sprays into the nose at bedtime. 15 mL 11  . KLOR-CON M20 20 MEQ tablet TAKE 1 TABLET (20 MEQ TOTAL) BY MOUTH DAILY. 90 tablet 2  . levocetirizine (XYZAL) 5 MG tablet Take 1 tablet by mouth daily.  5  . Magnesium 250 MG TABS Take 1 tablet by mouth daily.    [name] mexiletine (MEXITIL) 150 MG capsule Take 2 capsules (300 mg total) by mouth 2 (two) times daily. 360 capsule 3  . Multiple Vitamin (MULTIVITAMIN WITH MINERALS) TABS Take 1 tablet by mouth daily.    . nitroGLYCERIN (NITROSTAT) 0.4 MG SL tablet Place 1 tablet (0.4 mg total) under the tongue every 5 (five) minutes as needed for chest pain. 25 tablet 3  . polyvinyl alcohol-povidone (REFRESH) 1.4-0.6 % ophthalmic solution Place 1-2 drops into both eyes as needed (dry eyes).     . Psyllium (METAMUCIL PO) Take 3 teaspoons by mouth a day    . rivaroxaban (XARELTO) 20 MG TABS tablet Take 1 tablet (20 mg total) by mouth daily with supper. 30 tablet 6  . valsartan (DIOVAN) 40 MG tablet Take 1 tablet (40 mg total) by mouth daily. 90 tablet 3  . fluticasone (FLONASE) 50 MCG/ACT nasal spray INSTILL 2 SPRAYS INTO EACH NOSTRIL TWICE DAILY 16 g 3   No facility-administered medications prior to visit.    Allergies  Allergen Reactions  . Sulfa Antibiotics Rash    ROS As per  HPI  PE: Blood pressure 110/76, pulse 59, temperature 98 F (36.7 C), temperature source Oral, resp. rate 16, weight 227 lb (102.967 kg), SpO2 95 %. Gen: Alert, well appearing.  Patient is oriented to person, place, time, and situation. BBU:YZJQ: no injection, icteris, swelling, or exudate.  EOMI, PERRLA. Mouth: lips without lesion/swelling.  Oral mucosa pink and moist. Oropharynx without erythema, exudate, or swelling.  CV: RRR, distant S1 and S2.  No  m/r/g Chest is clear, no wheezing or rales. Normal symmetric air entry throughout both lung fields. No chest wall deformities or tenderness. ABD: soft, nondistended, BS normal.  ? R abd wall a bit more prominent than L in upper abdomen.  No HSM, mass, or bruit.  Discomfort to palpation in mid-epigastric region, peri-umbillical area, and a bit less so in lower quadrants diffusely.  No hernias palpable.  Tiny umbillical hernia palpable but nothing is poking through this.  No guarding or rebound tenderness. EXT: no clubbing, cyanosis, or edema.  SKIN: no jaundice or pallor or rash.  LABS:  Lab Results  Component Value Date   WBC 8.7 05/17/2014   HGB 14.8 05/17/2014   HCT 43.8 05/17/2014   MCV 102.3* 05/17/2014   PLT 318 05/17/2014     Chemistry      Component Value Date/Time   NA 137 05/17/2014 1536   K 4.2 05/17/2014 1536   CL 104 05/17/2014 1536   CO2 25 05/17/2014 1536   BUN 18 05/17/2014 1536   CREATININE 0.87 05/17/2014 1536   CREATININE 1.0 05/12/2013 1153      Component Value Date/Time   CALCIUM 9.0 05/17/2014 1536   ALKPHOS 60 04/19/2014 1202   AST 36 04/19/2014 1202   ALT 38 04/19/2014 1202   BILITOT 0.8 04/19/2014 1202     IMPRESSION AND PLAN:  1) Recurrent abdominal pain, with excessive gas and a long history of constipation. It could very well be that he is having upper GI sx's and he simply needs to adjust diet and continue tums or gas-ex prn. However, with his history of diverticulosis in ascending colon noted  on colonoscopy 2014, and with the ongoing/persistent nature of his pain, I feel like diverticulitis is a real possibility with him (esp an atypical presentation). Discussed options today, decided on checking CBC w/diff, CMET, Lipase, ESR, H pylori Ab, and UA with reflex microscopy. Also will empirically treat for diverticulitis with high dose augmentin (cipro and flagyl relatively contraindicated in him due to increased risk for cardiac dysrhythmia/torsades when given to pt taking amiodarone). Signs/symptoms to call or return for were reviewed and pt expressed understanding.  2) Preventative health care: prevnar 13 IM today. Pt states he got pneumovax #1 at about age 12, never got booster. This is not recorded in his chart so I put it in historical vaccines today. He'll need pneumovax #2 in 1 yr.  An After Visit Summary was printed and given to the patient.  FOLLOW UP: Return in about 2 weeks (around 06/29/2014) for f/u abd pain (30 min visit).

## 2014-06-16 ENCOUNTER — Encounter: Payer: Self-pay | Admitting: Family Medicine

## 2014-06-16 NOTE — Telephone Encounter (Signed)
Clarified: intended # was 7.

## 2014-06-17 ENCOUNTER — Telehealth: Payer: Self-pay | Admitting: Cardiology

## 2014-06-17 LAB — URINE CULTURE
COLONY COUNT: NO GROWTH
Organism ID, Bacteria: NO GROWTH

## 2014-06-17 NOTE — Telephone Encounter (Signed)
Pt called in stating that Xarelto has been causing him to have stomach pain for the last 2wks and he would like to be advised on what to do. Please call  Thanks

## 2014-06-17 NOTE — Telephone Encounter (Signed)
Spoke with pt, he has been having some abdominal pain and he wanted to make sure it was not related to the xarelto. He reports he takes the xarelto with a full meal. Discussed with kristin, pahrm md, not really a side effect we have seen with xarelto. Patient voiced understanding and just wanted to eliminate as a potential cause. He is working with his medical doctor regarding this issue and will call us with concerns.

## 2014-06-20 ENCOUNTER — Other Ambulatory Visit: Payer: Self-pay | Admitting: Family Medicine

## 2014-06-20 DIAGNOSIS — K5730 Diverticulosis of large intestine without perforation or abscess without bleeding: Secondary | ICD-10-CM

## 2014-06-20 DIAGNOSIS — R1033 Periumbilical pain: Secondary | ICD-10-CM

## 2014-06-21 ENCOUNTER — Encounter (HOSPITAL_BASED_OUTPATIENT_CLINIC_OR_DEPARTMENT_OTHER): Payer: Self-pay

## 2014-06-21 ENCOUNTER — Ambulatory Visit (HOSPITAL_BASED_OUTPATIENT_CLINIC_OR_DEPARTMENT_OTHER)
Admission: RE | Admit: 2014-06-21 | Discharge: 2014-06-21 | Disposition: A | Discharge status: Home / Self Care | Payer: Medicare Other | Source: Ambulatory Visit | Attending: Family Medicine | Admitting: Family Medicine

## 2014-06-21 DIAGNOSIS — R109 Unspecified abdominal pain: Secondary | ICD-10-CM | POA: Insufficient documentation

## 2014-06-21 DIAGNOSIS — R1033 Periumbilical pain: Secondary | ICD-10-CM

## 2014-06-21 DIAGNOSIS — K429 Umbilical hernia without obstruction or gangrene: Secondary | ICD-10-CM | POA: Diagnosis not present

## 2014-06-21 DIAGNOSIS — K5730 Diverticulosis of large intestine without perforation or abscess without bleeding: Secondary | ICD-10-CM

## 2014-06-21 DIAGNOSIS — K828 Other specified diseases of gallbladder: Secondary | ICD-10-CM | POA: Diagnosis not present

## 2014-06-21 DIAGNOSIS — N200 Calculus of kidney: Secondary | ICD-10-CM | POA: Insufficient documentation

## 2014-06-21 DIAGNOSIS — N3289 Other specified disorders of bladder: Secondary | ICD-10-CM | POA: Diagnosis not present

## 2014-06-21 MED ORDER — IOHEXOL 300 MG/ML  SOLN
100.0000 mL | Freq: Once | INTRAMUSCULAR | Status: AC | PRN
  Administered 2014-06-21: 100 mL via INTRAVENOUS

## 2014-06-22 ENCOUNTER — Other Ambulatory Visit: Payer: Self-pay | Admitting: Family Medicine

## 2014-06-22 DIAGNOSIS — K8020 Calculus of gallbladder without cholecystitis without obstruction: Secondary | ICD-10-CM

## 2014-06-22 DIAGNOSIS — R109 Unspecified abdominal pain: Secondary | ICD-10-CM

## 2014-06-28 ENCOUNTER — Encounter (HOSPITAL_BASED_OUTPATIENT_CLINIC_OR_DEPARTMENT_OTHER): Payer: Self-pay

## 2014-06-28 ENCOUNTER — Ambulatory Visit (HOSPITAL_BASED_OUTPATIENT_CLINIC_OR_DEPARTMENT_OTHER)
Admission: RE | Admit: 2014-06-28 | Discharge: 2014-06-28 | Disposition: A | Discharge status: Home / Self Care | Payer: Medicare Other | Source: Ambulatory Visit | Attending: Family Medicine | Admitting: Family Medicine

## 2014-06-28 DIAGNOSIS — K8020 Calculus of gallbladder without cholecystitis without obstruction: Secondary | ICD-10-CM | POA: Diagnosis not present

## 2014-06-28 DIAGNOSIS — N200 Calculus of kidney: Secondary | ICD-10-CM | POA: Insufficient documentation

## 2014-06-28 DIAGNOSIS — R109 Unspecified abdominal pain: Secondary | ICD-10-CM

## 2014-06-28 HISTORY — DX: Calculus of gallbladder without cholecystitis without obstruction: K80.20

## 2014-06-29 ENCOUNTER — Ambulatory Visit: Payer: Medicare Other | Admitting: Family Medicine

## 2014-07-05 ENCOUNTER — Encounter: Payer: Self-pay | Admitting: Internal Medicine

## 2014-07-07 ENCOUNTER — Ambulatory Visit (INDEPENDENT_AMBULATORY_CARE_PROVIDER_SITE_OTHER): Payer: Medicare Other | Admitting: *Deleted

## 2014-07-07 ENCOUNTER — Encounter: Payer: Self-pay | Admitting: Internal Medicine

## 2014-07-07 DIAGNOSIS — I472 Ventricular tachycardia: Secondary | ICD-10-CM

## 2014-07-07 DIAGNOSIS — I4729 Other ventricular tachycardia: Secondary | ICD-10-CM

## 2014-07-07 NOTE — Progress Notes (Signed)
Remote ICD transmission.   

## 2014-07-10 LAB — CUP PACEART REMOTE DEVICE CHECK
Battery Remaining Longevity: 66 mo
Battery Remaining Percentage: 100 %
Brady Statistic RA Percent Paced: 100 %
Brady Statistic RV Percent Paced: 100 %
Date Time Interrogation Session: 20160623042100
HIGH POWER IMPEDANCE MEASURED VALUE: 52 Ohm
Lead Channel Pacing Threshold Amplitude: 0.7 V
Lead Channel Pacing Threshold Amplitude: 1.2 V
Lead Channel Pacing Threshold Pulse Width: 0.5 ms
Lead Channel Setting Pacing Amplitude: 2 V
Lead Channel Setting Pacing Pulse Width: 0.5 ms
Lead Channel Setting Sensing Sensitivity: 1 mV
MDC IDC MSMT LEADCHNL LV IMPEDANCE VALUE: 857 Ohm
MDC IDC MSMT LEADCHNL LV PACING THRESHOLD AMPLITUDE: 1.9 V
MDC IDC MSMT LEADCHNL RA IMPEDANCE VALUE: 565 Ohm
MDC IDC MSMT LEADCHNL RA PACING THRESHOLD PULSEWIDTH: 0.3 ms
MDC IDC MSMT LEADCHNL RV IMPEDANCE VALUE: 722 Ohm
MDC IDC MSMT LEADCHNL RV PACING THRESHOLD PULSEWIDTH: 0.4 ms
MDC IDC PG SERIAL: 492371
MDC IDC SET LEADCHNL LV PACING AMPLITUDE: 2.5 V
MDC IDC SET LEADCHNL RV PACING AMPLITUDE: 2.5 V
MDC IDC SET LEADCHNL RV PACING PULSEWIDTH: 0.4 ms
MDC IDC SET LEADCHNL RV SENSING SENSITIVITY: 0.5 mV
MDC IDC SET ZONE DETECTION INTERVAL: 300 ms
MDC IDC SET ZONE DETECTION INTERVAL: 333 ms
Zone Setting Detection Interval: 600 ms

## 2014-07-22 ENCOUNTER — Encounter: Payer: Self-pay | Admitting: Cardiology

## 2014-07-28 DIAGNOSIS — Z85828 Personal history of other malignant neoplasm of skin: Secondary | ICD-10-CM | POA: Diagnosis not present

## 2014-07-28 DIAGNOSIS — D485 Neoplasm of uncertain behavior of skin: Secondary | ICD-10-CM | POA: Diagnosis not present

## 2014-07-28 DIAGNOSIS — L821 Other seborrheic keratosis: Secondary | ICD-10-CM | POA: Diagnosis not present

## 2014-07-28 DIAGNOSIS — L905 Scar conditions and fibrosis of skin: Secondary | ICD-10-CM | POA: Diagnosis not present

## 2014-07-28 DIAGNOSIS — L570 Actinic keratosis: Secondary | ICD-10-CM | POA: Diagnosis not present

## 2014-07-28 DIAGNOSIS — D1801 Hemangioma of skin and subcutaneous tissue: Secondary | ICD-10-CM | POA: Diagnosis not present

## 2014-08-18 DIAGNOSIS — L570 Actinic keratosis: Secondary | ICD-10-CM | POA: Diagnosis not present

## 2014-08-23 ENCOUNTER — Other Ambulatory Visit: Payer: Self-pay | Admitting: *Deleted

## 2014-08-23 DIAGNOSIS — Z95810 Presence of automatic (implantable) cardiac defibrillator: Secondary | ICD-10-CM

## 2014-08-23 DIAGNOSIS — I4891 Unspecified atrial fibrillation: Secondary | ICD-10-CM

## 2014-08-23 DIAGNOSIS — I429 Cardiomyopathy, unspecified: Secondary | ICD-10-CM

## 2014-08-23 DIAGNOSIS — I059 Rheumatic mitral valve disease, unspecified: Secondary | ICD-10-CM

## 2014-08-23 DIAGNOSIS — I480 Paroxysmal atrial fibrillation: Secondary | ICD-10-CM

## 2014-08-23 MED ORDER — AMIODARONE HCL 200 MG PO TABS: 200.0000 mg | ORAL_TABLET | Freq: Every day | ORAL | Status: DC

## 2014-09-09 ENCOUNTER — Other Ambulatory Visit: Payer: Self-pay | Admitting: Cardiology

## 2014-09-09 ENCOUNTER — Other Ambulatory Visit: Payer: Self-pay | Admitting: Emergency Medicine

## 2014-09-26 DIAGNOSIS — Z23 Encounter for immunization: Secondary | ICD-10-CM | POA: Diagnosis not present

## 2014-09-27 DIAGNOSIS — H2513 Age-related nuclear cataract, bilateral: Secondary | ICD-10-CM | POA: Diagnosis not present

## 2014-10-06 ENCOUNTER — Encounter: Payer: Self-pay | Admitting: Cardiology

## 2014-10-06 ENCOUNTER — Other Ambulatory Visit: Payer: Self-pay | Admitting: *Deleted

## 2014-10-06 ENCOUNTER — Ambulatory Visit (INDEPENDENT_AMBULATORY_CARE_PROVIDER_SITE_OTHER): Payer: Medicare Other | Admitting: *Deleted

## 2014-10-06 DIAGNOSIS — I472 Ventricular tachycardia: Secondary | ICD-10-CM

## 2014-10-06 DIAGNOSIS — I480 Paroxysmal atrial fibrillation: Secondary | ICD-10-CM

## 2014-10-06 DIAGNOSIS — I4891 Unspecified atrial fibrillation: Secondary | ICD-10-CM

## 2014-10-06 DIAGNOSIS — I059 Rheumatic mitral valve disease, unspecified: Secondary | ICD-10-CM

## 2014-10-06 DIAGNOSIS — I4729 Other ventricular tachycardia: Secondary | ICD-10-CM

## 2014-10-06 DIAGNOSIS — Z95810 Presence of automatic (implantable) cardiac defibrillator: Secondary | ICD-10-CM

## 2014-10-06 DIAGNOSIS — I429 Cardiomyopathy, unspecified: Secondary | ICD-10-CM

## 2014-10-06 MED ORDER — MEXILETINE HCL 150 MG PO CAPS: 300.0000 mg | ORAL_CAPSULE | Freq: Two times a day (BID) | ORAL | Status: DC

## 2014-10-06 NOTE — Progress Notes (Signed)
Remote ICD transmission.   

## 2014-10-10 LAB — CUP PACEART REMOTE DEVICE CHECK
Brady Statistic RA Percent Paced: 100 %
Date Time Interrogation Session: 20160922045900
HighPow Impedance: 51 Ohm
Lead Channel Impedance Value: 570 Ohm
Lead Channel Impedance Value: 720 Ohm
Lead Channel Impedance Value: 879 Ohm
Lead Channel Pacing Threshold Amplitude: 1.2 V
Lead Channel Setting Pacing Amplitude: 2.5 V
Lead Channel Setting Pacing Amplitude: 2.5 V
Lead Channel Setting Pacing Pulse Width: 0.5 ms
MDC IDC MSMT BATTERY REMAINING LONGEVITY: 66 mo
MDC IDC MSMT BATTERY REMAINING PERCENTAGE: 100 %
MDC IDC MSMT LEADCHNL LV PACING THRESHOLD AMPLITUDE: 1.9 V
MDC IDC MSMT LEADCHNL LV PACING THRESHOLD PULSEWIDTH: 0.5 ms
MDC IDC MSMT LEADCHNL RA PACING THRESHOLD AMPLITUDE: 0.7 V
MDC IDC MSMT LEADCHNL RA PACING THRESHOLD PULSEWIDTH: 0.3 ms
MDC IDC MSMT LEADCHNL RV PACING THRESHOLD PULSEWIDTH: 0.4 ms
MDC IDC SET LEADCHNL LV SENSING SENSITIVITY: 1 mV
MDC IDC SET LEADCHNL RA PACING AMPLITUDE: 2 V
MDC IDC SET LEADCHNL RV PACING PULSEWIDTH: 0.4 ms
MDC IDC SET LEADCHNL RV SENSING SENSITIVITY: 0.5 mV
MDC IDC SET ZONE DETECTION INTERVAL: 300 ms
MDC IDC STAT BRADY RV PERCENT PACED: 100 %
Pulse Gen Serial Number: 492371
Zone Setting Detection Interval: 333 ms
Zone Setting Detection Interval: 600 ms

## 2014-10-14 ENCOUNTER — Other Ambulatory Visit: Payer: Self-pay | Admitting: *Deleted

## 2014-10-14 DIAGNOSIS — I4891 Unspecified atrial fibrillation: Secondary | ICD-10-CM

## 2014-10-14 DIAGNOSIS — I480 Paroxysmal atrial fibrillation: Secondary | ICD-10-CM

## 2014-10-14 DIAGNOSIS — I059 Rheumatic mitral valve disease, unspecified: Secondary | ICD-10-CM

## 2014-10-14 DIAGNOSIS — I429 Cardiomyopathy, unspecified: Secondary | ICD-10-CM

## 2014-10-14 DIAGNOSIS — Z95810 Presence of automatic (implantable) cardiac defibrillator: Secondary | ICD-10-CM

## 2014-10-14 MED ORDER — VALSARTAN 40 MG PO TABS: 40.0000 mg | ORAL_TABLET | Freq: Every day | ORAL | Status: DC

## 2014-10-17 ENCOUNTER — Telehealth: Payer: Self-pay | Admitting: Cardiology

## 2014-10-17 NOTE — Telephone Encounter (Signed)
Spoke with pt, he has taken the charcoal. He will monitor his bp.and drink extra fluids if needed.  He will not take anymore charcoal today. He will call with any more problems.

## 2014-10-17 NOTE — Telephone Encounter (Signed)
[name] is calling because he states he accidentally took  (4) 16m Valsartan.. Thanks

## 2014-10-17 NOTE — Telephone Encounter (Signed)
Would NOT recommend charcoal.  Increase fluids today.  Monitor BP at home today if able.  Agree with patient pharmacy to "lay low".  Dose is well WNL for most patients.

## 2014-10-17 NOTE — Telephone Encounter (Signed)
Returned call to patient. He states he took x4 of his 49m valsartan this AM by mistake. Has not taken other meds. Called his pharmacy, pharmacist suggested he "lay low" & rest, keep check on BP, hold carvedilol. Pt called here for further instruction. He also states his wife went out to get charcoal tablets. He intends on taking these.  i advised to wait it out, take other meds, would ask our pharmacist if further recommendations.  Advised if systolic BP <<76-19and/or symptomatic at rest, go to ER. Pt voiced understanding, agreeable to this plan.

## 2014-10-20 ENCOUNTER — Ambulatory Visit (HOSPITAL_BASED_OUTPATIENT_CLINIC_OR_DEPARTMENT_OTHER)
Admission: RE | Admit: 2014-10-20 | Discharge: 2014-10-20 | Disposition: A | Discharge status: Home / Self Care | Payer: Medicare Other | Source: Ambulatory Visit | Attending: Family Medicine | Admitting: Family Medicine

## 2014-10-20 ENCOUNTER — Encounter (HOSPITAL_BASED_OUTPATIENT_CLINIC_OR_DEPARTMENT_OTHER): Payer: Self-pay

## 2014-10-20 ENCOUNTER — Ambulatory Visit (INDEPENDENT_AMBULATORY_CARE_PROVIDER_SITE_OTHER): Payer: Medicare Other | Admitting: Family Medicine

## 2014-10-20 ENCOUNTER — Encounter: Payer: Self-pay | Admitting: Family Medicine

## 2014-10-20 VITALS — BP 125/80 | HR 61 | Temp 97.9°F | Resp 16 | Ht 70.5 in | Wt 232.0 lb

## 2014-10-20 DIAGNOSIS — B779 Ascariasis, unspecified: Secondary | ICD-10-CM | POA: Diagnosis not present

## 2014-10-20 DIAGNOSIS — I2510 Atherosclerotic heart disease of native coronary artery without angina pectoris: Secondary | ICD-10-CM | POA: Diagnosis not present

## 2014-10-20 DIAGNOSIS — R109 Unspecified abdominal pain: Secondary | ICD-10-CM | POA: Diagnosis not present

## 2014-10-20 HISTORY — DX: Other constipation: K59.09

## 2014-10-20 LAB — COMPREHENSIVE METABOLIC PANEL
ALK PHOS: 64 U/L (ref 39–117)
ALT: 34 U/L (ref 0–53)
AST: 31 U/L (ref 0–37)
Albumin: 3.9 g/dL (ref 3.5–5.2)
BILIRUBIN TOTAL: 0.7 mg/dL (ref 0.2–1.2)
BUN: 15 mg/dL (ref 6–23)
CALCIUM: 9.1 mg/dL (ref 8.4–10.5)
CO2: 26 meq/L (ref 19–32)
Chloride: 104 mEq/L (ref 96–112)
Creatinine, Ser: 0.97 mg/dL (ref 0.40–1.50)
GFR: 80.62 mL/min (ref >60.00)
Glucose, Bld: 105 mg/dL — ABNORMAL HIGH (ref 70–99)
POTASSIUM: 4.3 meq/L (ref 3.5–5.1)
Sodium: 138 mEq/L (ref 135–145)
Total Protein: 6.7 g/dL (ref 6.0–8.3)

## 2014-10-20 LAB — CBC WITH DIFFERENTIAL/PLATELET
BASOS ABS: 0 10*3/uL (ref 0.0–0.1)
BASOS PCT: 0.4 % (ref 0.0–3.0)
EOS PCT: 6.2 % — AB (ref 0.0–5.0)
Eosinophils Absolute: 0.5 10*3/uL (ref 0.0–0.7)
HEMATOCRIT: 45.5 % (ref 39.0–52.0)
Hemoglobin: 15.1 g/dL (ref 13.0–17.0)
LYMPHS ABS: 2.2 10*3/uL (ref 0.7–4.0)
LYMPHS PCT: 25.9 % (ref 12.0–46.0)
MCHC: 33.2 g/dL (ref 30.0–36.0)
MCV: 102.7 fl — AB (ref 78.0–100.0)
MONOS PCT: 7.1 % (ref 3.0–12.0)
Monocytes Absolute: 0.6 10*3/uL (ref 0.1–1.0)
NEUTROS ABS: 5 10*3/uL (ref 1.4–7.7)
NEUTROS PCT: 60.4 % (ref 43.0–77.0)
PLATELETS: 322 10*3/uL (ref 150.0–400.0)
RBC: 4.43 Mil/uL (ref 4.22–5.81)
RDW: 14.4 % (ref 11.5–15.5)
WBC: 8.4 10*3/uL (ref 4.0–10.5)

## 2014-10-20 MED ORDER — ALBENDAZOLE 200 MG PO TABS: ORAL_TABLET | ORAL | Status: DC

## 2014-10-20 NOTE — Progress Notes (Signed)
Pre visit review using our clinic review tool, if applicable. No additional management support is needed unless otherwise documented below in the visit note.

## 2014-10-20 NOTE — Progress Notes (Signed)
OFFICE VISIT  10/20/2014   CC:  Chief Complaint  Patient presents with  . GI Problem   HPI:    Patient is a 74 y.o. Caucasian male who presents for recurrent abd pains.  Seems to have some mild abd pain daily, some days worse than others. Lately he feels light pain in area to the left of his umbillicus, also feels it every month or two on the opposite side of abdomen.  Notices it most often after eating.  Usually lasts 3-4 minutes.  No nausea.  His pains are very mild, just nuisance-type pain.  No postprandial RUQ pains or epigastric pains. Says all of his BM's float, some have a shape of a "thin ribbon" of stool.  Four days ago he passed 2 long, stringy things that he pulled from his anal opening and he brought one in to show me today and it looks like a long worm possibly (about 4 inches long, flat/2-3 mm thick).    Past Medical History  Diagnosis Date  . Atrial fibrillation ([place])     Ablation for a-flutter 05/2000  . Cardiomyopathy     Ischemic.  EF 20%  . Hypertension   . Hyperlipidemia   . Allergic rhinitis   . Obstructive sleep apnea     CPAP 16  . CAD (coronary artery disease)     PTCA RCA 1997 (Inf/post MI);  [name] Ventricular tachycardia ([place])     ICD, pacer  . Adenomatous colon polyp 2009; 06/2012    Colonoscopy 07/2007---Dr. [facility]--Repeat 06/2012 showed one tubular adenoma w/out high grade dysplasia.  . Nephrolithiasis     No distal stones on CT 2010; stable 8 mm RLP stone, 38m LMP stone 10/2013 plain film.  Abd u/s 06/2014: 9 mm nonobstructing lower pole right renal stone.  . [place]   . Microscopic hematuria     Cysto neg 02/2009 except for BPH-likely has microhem from his renal stone dz  . Diverticulosis of colon 2005    Noted on Colonoscopies in 2005 and 2009 and on CT 2010  . Internal hemorrhoids     Colonoscopy 07/2007  . Hepatic steatosis 2010    Noted on CT abd 2010 and on u/s abd 04/2009 (transaminasemia).    . Allergic rhinitis   .  BPH (benign prostatic hypertrophy)     PSAs ok (followed by urologist): no LUTS  . History of impaired glucose tolerance   . Shortness of breath   . [place]   . Pacemaker     pacermaker /ICD/CRT---LV lead replacement 12/2012.  F/u echo showed mild improvement in mitral regurg but EF still 10%.  . Paronychia of right thumb 08/22/2012  . Gallstones 06/2014    No evidence of cholecystitis on abd u/s 06/2014    Past Surgical History  Procedure Laterality Date  . Icd      a) Guidant Contak H170- SVirl Axe MD 07/25/06 (second device) b) Medtronic CRT-D  . Breast surgery  1990    "Tissue removed from breast"  . Umbilical hernia repair    . Hernia repair  1975    bilateral inguinal  . Extracorporeal shock wave lithotripsy    . Atrial ablation surgery    . Cardiac catheterization    . Esophagogastroduodenoscopy N/A 07/13/2012    Procedure: ESOPHAGOGASTRODUODENOSCOPY (EGD);  Surgeon: MJeryl Columbia MD;  [facility];  Service: Endoscopy;  Laterality: N/A;  NORMAL  . Colonoscopy N/A 07/13/2012    Diverticula  ascending colon.  Polyp x 1-recall 5 yrs (Dr. Watt Climes) Procedure: COLONOSCOPY;  Surgeon: Jeryl Columbia, MD;  Location: [facility];  Service: Endoscopy;  Laterality: N/A; polypectomy x 1   . Carotid doppler  09/25/12    No signif stenosis  . Lead revision Bilateral 12/26/2011    Procedure: LEAD REVISION;  Surgeon: Evans Lance, MD;  Location: [facility] CATH LAB;  Service: Cardiovascular;  Laterality: Bilateral;    Outpatient Prescriptions Prior to Visit  Medication Sig Dispense Refill  . albuterol (VENTOLIN HFA) 108 (90 BASE) MCG/ACT inhaler Inhale 2 puffs into the lungs every 6 (six) hours as needed for shortness of breath. 18 g 2  . amiodarone (PACERONE) 200 MG tablet Take 1 tablet (200 mg total) by mouth daily. 90 tablet 0  . carvedilol (COREG) 12.5 MG tablet TAKE 1 TABLET BY MOUTH TWICE DAILY WITH A MEAL 180 tablet 0  . Cholecalciferol (VITAMIN D PO)  Take 3,000 mcg by mouth daily.    [name] eplerenone (INSPRA) 25 MG tablet Take 1 tablet (25 mg total) by mouth daily. 90 tablet 3  . fluticasone (FLONASE) 50 MCG/ACT nasal spray INSTILL 2 SPRAYS INTO EACH NOSTRIL TWICE DAILY 96 g 3  . furosemide (LASIX) 40 MG tablet TAKE 2 TABLETS BY MOUTH EVERY DAY 180 tablet 1  . ipratropium (ATROVENT) 0.06 % nasal spray Place 2 sprays into the nose at bedtime. 15 mL 11  . KLOR-CON M20 20 MEQ tablet TAKE 1 TABLET (20 MEQ TOTAL) BY MOUTH DAILY. 90 tablet 2  . levocetirizine (XYZAL) 5 MG tablet Take 1 tablet by mouth daily.  5  . Magnesium 250 MG TABS Take 1 tablet by mouth daily.    [name] mexiletine (MEXITIL) 150 MG capsule Take 2 capsules (300 mg total) by mouth 2 (two) times daily. 360 capsule 3  . Multiple Vitamin (MULTIVITAMIN WITH MINERALS) TABS Take 1 tablet by mouth daily.    . nitroGLYCERIN (NITROSTAT) 0.4 MG SL tablet Place 1 tablet (0.4 mg total) under the tongue every 5 (five) minutes as needed for chest pain. 25 tablet 3  . polyvinyl alcohol-povidone (REFRESH) 1.4-0.6 % ophthalmic solution Place 1-2 drops into both eyes as needed (dry eyes).     . Psyllium (METAMUCIL PO) Take 3 teaspoons by mouth a day    . rivaroxaban (XARELTO) 20 MG TABS tablet Take 1 tablet (20 mg total) by mouth daily with supper. 30 tablet 6  . valsartan (DIOVAN) 40 MG tablet Take 1 tablet (40 mg total) by mouth daily. 90 tablet 0  . amoxicillin-clavulanate (AUGMENTIN XR) 1000-62.5 MG per tablet Take 2 tablets by mouth 2 (two) times daily. (Patient not taking: Reported on 10/20/2014) 70 tablet 0   No facility-administered medications prior to visit.    Allergies  Allergen Reactions  . Sulfa Antibiotics Rash    ROS As per HPI  PE: Blood pressure 125/80, pulse 61, temperature 97.9 F (36.6 C), temperature source Oral, resp. rate 16, height 5' 10.5" (1.791 m), weight 232 lb (105.235 kg), SpO2 94 %. Gen: Alert, well appearing.  Patient is oriented to person, place, time, and  situation. No further exam today.  LABS:  Lab Results  Component Value Date   WBC 8.2 06/15/2014   HGB 15.9 06/15/2014   HCT 47.2 06/15/2014   MCV 102.7* 06/15/2014   PLT 354.0 06/15/2014     Chemistry      Component Value Date/Time   NA 137 06/15/2014 1223   K 4.3 06/15/2014 1223  CL 101 06/15/2014 1223   CO2 29 06/15/2014 1223   BUN 16 06/15/2014 1223   CREATININE 1.06 06/15/2014 1223   CREATININE 0.87 05/17/2014 1536      Component Value Date/Time   CALCIUM 9.8 06/15/2014 1223   ALKPHOS 55 06/15/2014 1223   AST 34 06/15/2014 1223   ALT 37 06/15/2014 1223   BILITOT 0.9 06/15/2014 1223     Lab Results  Component Value Date   TSH 2.282 04/19/2014   IMPRESSION AND PLAN:  Recurrent abd pains, mild, with recent passing of what I suspect are adult round or tape worms in stool. Send stool sample for ova/parasites. Check KUB. Check CBC and CMET. Albendazole 400 mg x1 dose.  An After Visit Summary was printed and given to the patient.  FOLLOW UP: Return in about 6 weeks (around 12/01/2014) for f/u abd pains.

## 2014-10-20 NOTE — Addendum Note (Signed)
Addended by: Ralph Dowdy on: 10/20/2014 01:36 PM   Modules accepted: Orders

## 2014-10-25 ENCOUNTER — Encounter: Payer: Self-pay | Admitting: Cardiology

## 2014-10-25 NOTE — Progress Notes (Signed)
HPI: FU history of coronary artery disease, mixed ischemic and nonischemic cardiomyopathy, history of ICD, paroxysmal atrial fibrillation, and ventricular tachycardia. Previous abdominal ultrasound in April 2006 showed no aneurysm. Last cardiac catheterization in October 2008 showed an occluded right coronary artery with left-to-right collateralization. Ejection fraction is 20%. There was no other coronary disease noted. Last Myoview in Dec 2011 showed prior inferior infarct but no ischemia. His ejection fraction was 19%. ABIs in December of 2011 were normal. Previous carotid Dopplers in Sept 2014 showed 0-39% stenosis bilaterally.  An echocardiogram in January 2015 showed an ejection fraction of 20%. There was dilatation of the aortic root measuring 45 mm. There was mild mitral regurgitation and mild left atrial enlargement. There was mild right atrial enlargement. The patient has had upgrade of his device to CRT-D. Patient had revision of his left ventricular lead in December 2013. Chest CT September 2015 showed no aneurysm. Since last seen, the patient denies any dyspnea on exertion, orthopnea, PND, pedal edema, palpitations, syncope or chest pain.   Current Outpatient Prescriptions  Medication Sig Dispense Refill  . albuterol (VENTOLIN HFA) 108 (90 BASE) MCG/ACT inhaler Inhale 2 puffs into the lungs every 6 (six) hours as needed for shortness of breath. 18 g 2  . amiodarone (PACERONE) 200 MG tablet Take 1 tablet (200 mg total) by mouth daily. 90 tablet 0  . carvedilol (COREG) 12.5 MG tablet TAKE 1 TABLET BY MOUTH TWICE DAILY WITH A MEAL 180 tablet 0  . Cholecalciferol (VITAMIN D PO) Take 3,000 mcg by mouth daily.    [name] eplerenone (INSPRA) 25 MG tablet Take 1 tablet (25 mg total) by mouth daily. 90 tablet 3  . fluticasone (FLONASE) 50 MCG/ACT nasal spray INSTILL 2 SPRAYS INTO EACH NOSTRIL TWICE DAILY 96 g 3  . furosemide (LASIX) 40 MG tablet TAKE 2 TABLETS BY MOUTH EVERY DAY 180 tablet 1  .  ipratropium (ATROVENT) 0.06 % nasal spray Place 2 sprays into the nose at bedtime. 15 mL 11  . levocetirizine (XYZAL) 5 MG tablet Take 1 tablet by mouth daily.  5  . Magnesium 250 MG TABS Take 1 tablet by mouth daily.    [name] mexiletine (MEXITIL) 150 MG capsule Take 2 capsules (300 mg total) by mouth 2 (two) times daily. 360 capsule 3  . Multiple Vitamin (MULTIVITAMIN WITH MINERALS) TABS Take 1 tablet by mouth daily.    . nitroGLYCERIN (NITROSTAT) 0.4 MG SL tablet Place 1 tablet (0.4 mg total) under the tongue every 5 (five) minutes as needed for chest pain. 25 tablet 3  . polyvinyl alcohol-povidone (REFRESH) 1.4-0.6 % ophthalmic solution Place 1-2 drops into both eyes as needed (dry eyes).     . potassium chloride SA (K-DUR,KLOR-CON) 20 MEQ tablet TAKE 1 TABLET BY MOUTH EVERY DAY 90 tablet 1  . Psyllium (METAMUCIL PO) Take 3 teaspoons by mouth a day    . rivaroxaban (XARELTO) 20 MG TABS tablet Take 1 tablet (20 mg total) by mouth daily with supper. 30 tablet 6  . valsartan (DIOVAN) 40 MG tablet Take 1 tablet (40 mg total) by mouth daily. 90 tablet 0   No current facility-administered medications for this visit.     Past Medical History  Diagnosis Date  . Atrial fibrillation ([place])     Ablation for a-flutter 05/2000  . Cardiomyopathy     Ischemic.  EF 20%  . Hypertension   . Hyperlipidemia   . Allergic rhinitis   . Obstructive sleep apnea  CPAP 16  . CAD (coronary artery disease)     PTCA RCA 1997 (Inf/post MI);  [name] Ventricular tachycardia ([place])     ICD, pacer  . Adenomatous colon polyp 2009; 06/2012    Colonoscopy 07/2007---Dr. Barron Schmid GI--Repeat 06/2012 showed one tubular adenoma w/out high grade dysplasia.  . Nephrolithiasis     No distal stones on CT 2010; stable 8 mm RLP stone, 35m LMP stone 10/2013 plain film.  Abd u/s 06/2014: 9 mm nonobstructing lower pole right renal stone.  . Myocardial infarction ([place]   . Microscopic hematuria     Cysto neg 02/2009 except for BPH-likely  has microhem from his renal stone dz  . Diverticulosis of colon 2005    Noted on Colonoscopies in 2005 and 2009 and on CT 2010  . Internal hemorrhoids     Colonoscopy 07/2007  . Hepatic steatosis 2010    Noted on CT abd 2010 and on u/s abd 04/2009 (transaminasemia).    . Allergic rhinitis   . BPH (benign prostatic hypertrophy)     PSAs ok (followed by urologist): no LUTS  . History of impaired glucose tolerance   . Shortness of breath   . CHF (congestive heart failure) ([place]   . Pacemaker     pacermaker /ICD/CRT---LV lead replacement 12/2012.  F/u echo showed mild improvement in mitral regurg but EF still 10%.  . Paronychia of right thumb 08/22/2012  . Gallstones 06/2014    No evidence of cholecystitis on abd u/s 06/2014  . Chronic constipation     Past Surgical History  Procedure Laterality Date  . Icd      a) Guidant Contak H170- SVirl Axe MD 07/25/06 (second device) b) Medtronic CRT-D  . Breast surgery  1990    "Tissue removed from breast"  . Umbilical hernia repair    . Hernia repair  1975    bilateral inguinal  . Extracorporeal shock wave lithotripsy    . Atrial ablation surgery    . Cardiac catheterization    . Esophagogastroduodenoscopy N/A 07/13/2012    Procedure: ESOPHAGOGASTRODUODENOSCOPY (EGD);  Surgeon: MJeryl Columbia MD;  [facility];  Service: Endoscopy;  Laterality: N/A;  NORMAL  . Colonoscopy N/A 07/13/2012    Diverticula ascending colon.  Polyp x 1-recall 5 yrs (Dr. MWatt Climes Procedure: COLONOSCOPY;  Surgeon: MJeryl Columbia MD;  [facility]: [facility];  Service: Endoscopy;  Laterality: N/A; polypectomy x 1   . Carotid doppler  09/25/12    No signif stenosis  . Lead revision Bilateral 12/26/2011    Procedure: LEAD REVISION;  Surgeon: GEvans Lance MD;  [facility];  Service: Cardiovascular;  Laterality: Bilateral;    Social History   Social History  . Marital Status: Married    Spouse Name: N/A  . Number of Children: N/A  . Years of  Education: N/A   Occupational History  . Retired    Social History Main Topics  . Smoking status: Former Smoker -- 2.00 packs/day for 15 years    Types: Cigarettes    Quit date: 01/15/1980  . Smokeless tobacco: Never Used  . Alcohol Use: 0.0 oz/week    0 Standard drinks or equivalent per week     Comment: couple glasses week  . Drug Use: No  . Sexual Activity: Not on file   Other Topics Concern  . Not on file   Social History Narrative   Married.   Hx of cigarettes: quit in the 1980s.  No alc/drugs.  ROS: no fevers or chills, productive cough, hemoptysis, dysphasia, odynophagia, melena, hematochezia, dysuria, hematuria, rash, seizure activity, orthopnea, PND, pedal edema, claudication. Remaining systems are negative.  Physical Exam: Well-developed well-nourished in no acute distress.  Skin is warm and dry.  HEENT is normal.  Neck is supple.  Chest is clear to auscultation with normal expansion.  Cardiovascular exam is regular rate and rhythm.  Abdominal exam nontender or distended. No masses palpated. Extremities show no edema. neuro grossly intact  ECG Sinus rhythm with ventricular pacing.

## 2014-10-26 ENCOUNTER — Other Ambulatory Visit: Payer: Self-pay | Admitting: Cardiology

## 2014-10-28 ENCOUNTER — Ambulatory Visit (INDEPENDENT_AMBULATORY_CARE_PROVIDER_SITE_OTHER): Payer: Medicare Other | Admitting: Cardiology

## 2014-10-28 ENCOUNTER — Encounter: Payer: Self-pay | Admitting: Cardiology

## 2014-10-28 VITALS — BP 122/70 | HR 60 | Ht 72.0 in | Wt 230.3 lb

## 2014-10-28 DIAGNOSIS — I472 Ventricular tachycardia, unspecified: Secondary | ICD-10-CM

## 2014-10-28 DIAGNOSIS — I5022 Chronic systolic (congestive) heart failure: Secondary | ICD-10-CM | POA: Diagnosis not present

## 2014-10-28 DIAGNOSIS — I4729 Other ventricular tachycardia: Secondary | ICD-10-CM

## 2014-10-28 DIAGNOSIS — I4891 Unspecified atrial fibrillation: Secondary | ICD-10-CM

## 2014-10-28 DIAGNOSIS — Z95810 Presence of automatic (implantable) cardiac defibrillator: Secondary | ICD-10-CM | POA: Diagnosis not present

## 2014-10-28 DIAGNOSIS — I2510 Atherosclerotic heart disease of native coronary artery without angina pectoris: Secondary | ICD-10-CM

## 2014-10-28 DIAGNOSIS — I2583 Coronary atherosclerosis due to lipid rich plaque: Secondary | ICD-10-CM

## 2014-10-28 NOTE — Assessment & Plan Note (Signed)
Patient remains in sinus rhythm. Continue beta blocker and xarelto. Patient has had hemoglobin and renal function checked recently.

## 2014-10-28 NOTE — Assessment & Plan Note (Signed)
No statin as it has caused increased liver functions previously. No aspirin given need for anticoagulation.

## 2014-10-28 NOTE — Assessment & Plan Note (Signed)
Continue present dose of Lasix.

## 2014-10-28 NOTE — Patient Instructions (Signed)
Your physician wants you to follow-up in: [place] will receive a reminder letter in the mail two months in advance. If you don't receive a letter, please call our office to schedule the follow-up appointment.   Your physician recommends that you return for lab work DJ:[place]  A chest x-ray takes a picture of the organs and structures inside the chest, including the heart, lungs, and blood vessels. This test can show several things, including, whether the heart is enlarges; whether fluid is building up in the lungs; and whether pacemaker / defibrillator leads are still in place.

## 2014-10-28 NOTE — Assessment & Plan Note (Signed)
Followed by electrophysiology. 

## 2014-10-28 NOTE — Assessment & Plan Note (Signed)
Continue amiodarone and mexiletine. Check TSH. Check chest x-ray. Recent liver functions normal.

## 2014-10-28 NOTE — Assessment & Plan Note (Signed)
Blood pressure controlled. Continue present medications.

## 2014-10-28 NOTE — Assessment & Plan Note (Signed)
Continue Diet. Not on statin as it caused increased liver functions previously.

## 2014-10-28 NOTE — Assessment & Plan Note (Signed)
Continue beta blocker and ARB.

## 2014-11-01 ENCOUNTER — Ambulatory Visit (HOSPITAL_BASED_OUTPATIENT_CLINIC_OR_DEPARTMENT_OTHER)
Admission: RE | Admit: 2014-11-01 | Discharge: 2014-11-01 | Disposition: A | Discharge status: Home / Self Care | Payer: Medicare Other | Source: Ambulatory Visit | Attending: Cardiology | Admitting: Cardiology

## 2014-11-01 ENCOUNTER — Other Ambulatory Visit: Payer: Self-pay | Admitting: Cardiology

## 2014-11-01 DIAGNOSIS — Z950 Presence of cardiac pacemaker: Secondary | ICD-10-CM | POA: Diagnosis not present

## 2014-11-01 DIAGNOSIS — R918 Other nonspecific abnormal finding of lung field: Secondary | ICD-10-CM | POA: Diagnosis not present

## 2014-11-01 DIAGNOSIS — I4891 Unspecified atrial fibrillation: Secondary | ICD-10-CM

## 2014-11-01 DIAGNOSIS — I517 Cardiomegaly: Secondary | ICD-10-CM | POA: Insufficient documentation

## 2014-11-02 LAB — TSH: TSH: 1.713 u[IU]/mL (ref 0.350–4.500)

## 2014-11-04 ENCOUNTER — Encounter: Payer: Self-pay | Admitting: Internal Medicine

## 2014-11-08 ENCOUNTER — Ambulatory Visit: Payer: Medicare Other | Admitting: Pulmonary Disease

## 2014-11-14 ENCOUNTER — Other Ambulatory Visit: Payer: Self-pay | Admitting: Cardiology

## 2014-11-17 ENCOUNTER — Other Ambulatory Visit: Payer: Self-pay | Admitting: *Deleted

## 2014-11-17 MED ORDER — FUROSEMIDE 40 MG PO TABS: 80.0000 mg | ORAL_TABLET | Freq: Every day | ORAL | Status: DC

## 2014-11-18 ENCOUNTER — Other Ambulatory Visit: Payer: Self-pay | Admitting: Cardiology

## 2014-11-18 DIAGNOSIS — I480 Paroxysmal atrial fibrillation: Secondary | ICD-10-CM

## 2014-11-18 DIAGNOSIS — I4891 Unspecified atrial fibrillation: Secondary | ICD-10-CM

## 2014-11-18 DIAGNOSIS — I059 Rheumatic mitral valve disease, unspecified: Secondary | ICD-10-CM

## 2014-11-18 DIAGNOSIS — I429 Cardiomyopathy, unspecified: Secondary | ICD-10-CM

## 2014-11-18 DIAGNOSIS — Z95810 Presence of automatic (implantable) cardiac defibrillator: Secondary | ICD-10-CM

## 2014-11-18 MED ORDER — AMIODARONE HCL 200 MG PO TABS: 200.0000 mg | ORAL_TABLET | Freq: Every day | ORAL | Status: DC

## 2014-11-30 ENCOUNTER — Other Ambulatory Visit: Payer: Self-pay

## 2014-11-30 ENCOUNTER — Other Ambulatory Visit: Payer: Self-pay | Admitting: *Deleted

## 2014-11-30 DIAGNOSIS — I4891 Unspecified atrial fibrillation: Secondary | ICD-10-CM

## 2014-11-30 DIAGNOSIS — I429 Cardiomyopathy, unspecified: Secondary | ICD-10-CM

## 2014-11-30 DIAGNOSIS — Z95810 Presence of automatic (implantable) cardiac defibrillator: Secondary | ICD-10-CM

## 2014-11-30 DIAGNOSIS — I480 Paroxysmal atrial fibrillation: Secondary | ICD-10-CM

## 2014-11-30 DIAGNOSIS — I059 Rheumatic mitral valve disease, unspecified: Secondary | ICD-10-CM

## 2014-11-30 MED ORDER — EPLERENONE 25 MG PO TABS: 25.0000 mg | ORAL_TABLET | Freq: Every day | ORAL | Status: DC

## 2014-11-30 MED ORDER — VALSARTAN 40 MG PO TABS: 40.0000 mg | ORAL_TABLET | Freq: Every day | ORAL | Status: DC

## 2014-12-07 ENCOUNTER — Other Ambulatory Visit: Payer: Self-pay | Admitting: Cardiology

## 2014-12-07 NOTE — Telephone Encounter (Signed)
Rx request sent to pharmacy.

## 2014-12-15 ENCOUNTER — Encounter: Payer: Self-pay | Admitting: Family Medicine

## 2014-12-15 ENCOUNTER — Ambulatory Visit (INDEPENDENT_AMBULATORY_CARE_PROVIDER_SITE_OTHER): Payer: Medicare Other | Admitting: Family Medicine

## 2014-12-15 VITALS — BP 118/80 | HR 62 | Temp 97.6°F | Resp 16 | Ht 72.0 in | Wt 229.0 lb

## 2014-12-15 DIAGNOSIS — D721 Eosinophilia, unspecified: Secondary | ICD-10-CM

## 2014-12-15 DIAGNOSIS — R109 Unspecified abdominal pain: Secondary | ICD-10-CM | POA: Diagnosis not present

## 2014-12-15 DIAGNOSIS — I2510 Atherosclerotic heart disease of native coronary artery without angina pectoris: Secondary | ICD-10-CM

## 2014-12-15 LAB — CBC WITH DIFFERENTIAL/PLATELET
BASOS PCT: 0 % (ref 0–1)
Basophils Absolute: 0 10*3/uL (ref 0.0–0.1)
EOS ABS: 0.6 10*3/uL (ref 0.0–0.7)
EOS PCT: 7 % — AB (ref 0–5)
HCT: 46.1 % (ref 39.0–52.0)
Hemoglobin: 15.2 g/dL (ref 13.0–17.0)
Lymphocytes Relative: 28 % (ref 12–46)
Lymphs Abs: 2.6 10*3/uL (ref 0.7–4.0)
MCH: 33.9 pg (ref 26.0–34.0)
MCHC: 33 g/dL (ref 30.0–36.0)
MCV: 102.7 fL — AB (ref 78.0–100.0)
MONOS PCT: 9 % (ref 3–12)
MPV: 9.6 fL (ref 8.6–12.4)
Monocytes Absolute: 0.8 10*3/uL (ref 0.1–1.0)
NEUTROS PCT: 56 % (ref 43–77)
Neutro Abs: 5.2 10*3/uL (ref 1.7–7.7)
PLATELETS: 358 10*3/uL (ref 150–400)
RBC: 4.49 MIL/uL (ref 4.22–5.81)
RDW: 13.3 % (ref 11.5–15.5)
WBC: 9.2 10*3/uL (ref 4.0–10.5)

## 2014-12-15 NOTE — Progress Notes (Signed)
Pre visit review using our clinic review tool, if applicable. No additional management support is needed unless otherwise documented below in the visit note.

## 2014-12-15 NOTE — Progress Notes (Signed)
OFFICE VISIT  12/15/2014   CC:  Chief Complaint  Patient presents with  . Abdominal Pain    x 2 weeks   HPI:    Patient is a 74 y.o. Caucasian male who presents for abd pains, chronic/episodic. When he eats he feels a sensation of something passing through his bowel in LLQ area, also mild pain right over umbillicus "like a churning sensation" that is more constant and not as related to eating as his LLQ discomfort.  This has been going on waxing and waning for at least 10 months.  No nausea or vomiting.  No diarrhea.  +Hx of constipation:  Has BM 1-2 times per day that are hard and large/difficult to pass--has been this way all his life he says.  No melena or hematochezia.  He notes mild R L abd protrusion compared to L side when he glances down at his abdomen and has noted this the last 10 mo assoc with these sx's. Good appetite.  No abnl wt loss.  No f/c/night sweats.  In October this year we thought he passed a flat or round worm or something that looked similar and treated him for ascaris but sx's really unchanged since then.  His stool sample showed no ova/parasites.  His abd x-ray showed constipation at that time.  His CBC showed elevated eosinoprils with normal WBC.  Past Medical History  Diagnosis Date  . Atrial fibrillation ([place])     Ablation for a-flutter 05/2000  . Cardiomyopathy     Ischemic.  EF 20%; pt has CRT-Device  . Hypertension   . Hyperlipidemia   . Allergic rhinitis   . Obstructive sleep apnea     CPAP 16  . CAD (coronary artery disease)     PTCA RCA 1997 (Inf/post MI);  [name] Ventricular tachycardia ([place])     ICD, pacer  . Adenomatous colon polyp 2009; 06/2012    Colonoscopy 07/2007---Dr. Barron Schmid GI--Repeat 06/2012 showed one tubular adenoma w/out high grade dysplasia.  . Nephrolithiasis     No distal stones on CT 2010; stable 8 mm RLP stone, 27m LMP stone 10/2013 plain film.  Abd u/s 06/2014: 9 mm nonobstructing lower pole right renal stone.  . Myocardial  infarction ([place]   . Microscopic hematuria     Cysto neg 02/2009 except for BPH-likely has microhem from his renal stone dz  . Diverticulosis of colon 2005    Noted on Colonoscopies in 2005 and 2009 and on CT 2010  . Internal hemorrhoids     Colonoscopy 07/2007  . Hepatic steatosis 2010; 2016    Noted on CT abd 2010 and on u/s abd 04/2009 (transaminasemia) and u/s 06/2014.  .Marland KitchenAllergic rhinitis   . BPH (benign prostatic hypertrophy)     PSAs ok (followed by urologist): no LUTS  . History of impaired glucose tolerance   . Shortness of breath   . CHF (congestive heart failure) ([place]   . Pacemaker     pacermaker /ICD/CRT---LV lead replacement 12/2012.  F/u echo showed mild improvement in mitral regurg but EF still 10%.  . Paronychia of right thumb 08/22/2012  . Gallstones 06/2014    No evidence of cholecystitis on abd u/s 06/2014  . Chronic constipation     Past Surgical History  Procedure Laterality Date  . Icd      a) Guidant Contak H170- SVirl Axe MD 07/25/06 (second device) b) Medtronic CRT-D  . Breast surgery  1990    "Tissue removed from breast"  .  Umbilical hernia repair    . Hernia repair  1975    bilateral inguinal  . Extracorporeal shock wave lithotripsy    . Atrial ablation surgery    . Cardiac catheterization    . Esophagogastroduodenoscopy N/A 07/13/2012    Procedure: ESOPHAGOGASTRODUODENOSCOPY (EGD);  Surgeon: Jeryl Columbia, MD;  [facility];  Service: Endoscopy;  Laterality: N/A;  NORMAL  . Colonoscopy N/A 07/13/2012    Diverticula ascending colon.  Polyp x 1-recall 5 yrs (Dr. Watt Climes) Procedure: COLONOSCOPY;  Surgeon: Jeryl Columbia, MD;  [facility];  Service: Endoscopy;  Laterality: N/A; polypectomy x 1   . Carotid doppler  09/25/12    No signif stenosis  . Lead revision Bilateral 12/26/2011    Procedure: LEAD REVISION;  Surgeon: Evans Lance, MD;  Location: [facility] CATH LAB;  Service: Cardiovascular;  Laterality: Bilateral;  . Transthoracic  echocardiogram  01/2013    EF 20%, multiple areas of LV akinesis, no signif valvular dz, grade 1 diast dysfxn    Outpatient Prescriptions Prior to Visit  Medication Sig Dispense Refill  . albuterol (VENTOLIN HFA) 108 (90 BASE) MCG/ACT inhaler Inhale 2 puffs into the lungs every 6 (six) hours as needed for shortness of breath. 18 g 2  . amiodarone (PACERONE) 200 MG tablet Take 1 tablet (200 mg total) by mouth daily. 90 tablet 3  . carvedilol (COREG) 12.5 MG tablet TAKE 1 TABLET BY MOUTH TWICE DAILY WITH A MEAL 180 tablet 0  . Cholecalciferol (VITAMIN D PO) Take 3,000 mcg by mouth daily.    [name] eplerenone (INSPRA) 25 MG tablet Take 1 tablet (25 mg total) by mouth daily. 90 tablet 3  . fluticasone (FLONASE) 50 MCG/ACT nasal spray INSTILL 2 SPRAYS INTO EACH NOSTRIL TWICE DAILY 96 g 3  . furosemide (LASIX) 40 MG tablet Take 2 tablets (80 mg total) by mouth daily. 180 tablet 3  . levocetirizine (XYZAL) 5 MG tablet Take 1 tablet by mouth daily.  5  . Magnesium 250 MG TABS Take 1 tablet by mouth daily.    [name] mexiletine (MEXITIL) 150 MG capsule Take 2 capsules (300 mg total) by mouth 2 (two) times daily. 360 capsule 3  . Multiple Vitamin (MULTIVITAMIN WITH MINERALS) TABS Take 1 tablet by mouth daily.    . nitroGLYCERIN (NITROSTAT) 0.4 MG SL tablet Place 1 tablet (0.4 mg total) under the tongue every 5 (five) minutes as needed for chest pain. 25 tablet 3  . polyvinyl alcohol-povidone (REFRESH) 1.4-0.6 % ophthalmic solution Place 1-2 drops into both eyes as needed (dry eyes).     . potassium chloride SA (K-DUR,KLOR-CON) 20 MEQ tablet TAKE 1 TABLET BY MOUTH EVERY DAY 90 tablet 1  . Psyllium (METAMUCIL PO) Take 3 teaspoons by mouth a day    . valsartan (DIOVAN) 40 MG tablet Take 1 tablet (40 mg total) by mouth daily. 90 tablet 0  . XARELTO 20 MG TABS tablet TAKE 1 TABLET(20 MG) BY MOUTH DAILY WITH DINNER 30 tablet 11  . ipratropium (ATROVENT) 0.06 % nasal spray Place 2 sprays into the nose at bedtime. (Patient  not taking: Reported on 12/15/2014) 15 mL 11   No facility-administered medications prior to visit.    Allergies  Allergen Reactions  . Sulfa Antibiotics Rash    ROS As per HPI  PE: Blood pressure 118/80, pulse 62, temperature 97.6 F (36.4 C), temperature source Oral, resp. rate 16, height 6' (1.829 m), weight 229 lb (103.874 kg), SpO2 94 %. Gen: Alert, well  appearing.  Patient is oriented to person, place, time, and situation. ENI:DPOE: no injection, icteris, swelling, or exudate.  EOMI, PERRLA. Mouth: lips without lesion/swelling.  Oral mucosa pink and moist. Oropharynx without erythema, exudate, or swelling.  CV: RRR, no m/r/g.   LUNGS: CTA bilat, nonlabored resps, good aeration in all lung fields. ABD: soft, nondistended, mild TTP in mid epigastric region and similar mild TTP in LLQ just a bit lateral to the umbillicus.  He has a small, reducible umbilical hernia.  No mass, bruit, or HSM noted. BS hyperactive today in all quadrants.   EXT: no clubbing, cyanosis, or edema.   LABS:  None today  Lab Results  Component Value Date   WBC 8.4 10/20/2014   HGB 15.1 10/20/2014   HCT 45.5 10/20/2014   MCV 102.7* 10/20/2014   PLT 322.0 10/20/2014     Chemistry      Component Value Date/Time   NA 138 10/20/2014 1049   K 4.3 10/20/2014 1049   CL 104 10/20/2014 1049   CO2 26 10/20/2014 1049   BUN 15 10/20/2014 1049   CREATININE 0.97 10/20/2014 1049   CREATININE 0.87 05/17/2014 1536      Component Value Date/Time   CALCIUM 9.1 10/20/2014 1049   ALKPHOS 64 10/20/2014 1049   AST 31 10/20/2014 1049   ALT 34 10/20/2014 1049   BILITOT 0.7 10/20/2014 1049     Lab Results  Component Value Date   LIPASE 11.0 06/15/2014   Stool ova parasite testing 10/20/14 was NEGATIVE. Abd x-ray 10/20/14: increased colonic stool burden consistent with clinical constipation.  There is no evidence of ileus nor obstruction nor other acute intra-abdominal abnormality. Complete abd ultrasound  06/28/14: IMPRESSION: 1. Multiple gallstones. No evidence of cholecystitis. 2. Liver is slightly echogenic suggesting fatty infiltration and/or pedis air disease. 3. 9 mm nonobstructing lower pole right renal stone.  IMPRESSION AND PLAN:  Recurrent abdominal pain, for the most part this is postprandial.   Pt requests GI referral, specifically [facility], so I ordered this today. I also will recheck CBC w/diff, CMET, and lipase and I'll check ESR and CRP. Will also check CT abd/pelv with contrast.  An After Visit Summary was printed and given to the patient.  FOLLOW UP: Return if symptoms worsen or fail to improve.

## 2014-12-16 LAB — COMPREHENSIVE METABOLIC PANEL
ALT: 35 U/L (ref 9–46)
AST: 31 U/L (ref 10–35)
Albumin: 4 g/dL (ref 3.6–5.1)
Alkaline Phosphatase: 57 U/L (ref 40–115)
BUN: 15 mg/dL (ref 7–25)
CALCIUM: 9.2 mg/dL (ref 8.6–10.3)
CHLORIDE: 101 mmol/L (ref 98–110)
CO2: 28 mmol/L (ref 20–31)
Creat: 1.06 mg/dL (ref 0.70–1.18)
GLUCOSE: 74 mg/dL (ref 65–99)
POTASSIUM: 4.4 mmol/L (ref 3.5–5.3)
Sodium: 138 mmol/L (ref 135–146)
Total Bilirubin: 1 mg/dL (ref 0.2–1.2)
Total Protein: 7 g/dL (ref 6.1–8.1)

## 2014-12-16 LAB — SEDIMENTATION RATE: Sed Rate: 7 mm/hr (ref 0–20)

## 2014-12-16 LAB — LIPASE: LIPASE: 13 U/L (ref 7–60)

## 2014-12-16 LAB — C-REACTIVE PROTEIN: CRP: <0.5 mg/dL (ref <0.60)

## 2014-12-20 ENCOUNTER — Encounter: Payer: Self-pay | Admitting: Gastroenterology

## 2014-12-22 ENCOUNTER — Ambulatory Visit (HOSPITAL_BASED_OUTPATIENT_CLINIC_OR_DEPARTMENT_OTHER)
Admission: RE | Admit: 2014-12-22 | Discharge: 2014-12-22 | Disposition: A | Discharge status: Home / Self Care | Payer: Medicare Other | Source: Ambulatory Visit | Attending: Family Medicine | Admitting: Family Medicine

## 2014-12-22 ENCOUNTER — Encounter (HOSPITAL_BASED_OUTPATIENT_CLINIC_OR_DEPARTMENT_OTHER): Payer: Self-pay

## 2014-12-22 DIAGNOSIS — R109 Unspecified abdominal pain: Secondary | ICD-10-CM | POA: Insufficient documentation

## 2014-12-22 DIAGNOSIS — N200 Calculus of kidney: Secondary | ICD-10-CM | POA: Diagnosis not present

## 2014-12-22 DIAGNOSIS — K5730 Diverticulosis of large intestine without perforation or abscess without bleeding: Secondary | ICD-10-CM | POA: Diagnosis not present

## 2014-12-22 DIAGNOSIS — K8020 Calculus of gallbladder without cholecystitis without obstruction: Secondary | ICD-10-CM | POA: Insufficient documentation

## 2014-12-22 DIAGNOSIS — D721 Eosinophilia, unspecified: Secondary | ICD-10-CM

## 2014-12-22 DIAGNOSIS — Z87442 Personal history of urinary calculi: Secondary | ICD-10-CM | POA: Insufficient documentation

## 2014-12-22 DIAGNOSIS — R918 Other nonspecific abnormal finding of lung field: Secondary | ICD-10-CM | POA: Diagnosis not present

## 2014-12-22 DIAGNOSIS — R933 Abnormal findings on diagnostic imaging of other parts of digestive tract: Secondary | ICD-10-CM | POA: Insufficient documentation

## 2014-12-22 MED ORDER — IOHEXOL 300 MG/ML  SOLN
100.0000 mL | Freq: Once | INTRAMUSCULAR | Status: AC | PRN
  Administered 2014-12-22: 100 mL via INTRAVENOUS

## 2014-12-23 ENCOUNTER — Other Ambulatory Visit: Payer: Self-pay | Admitting: Family Medicine

## 2014-12-23 MED ORDER — CIPROFLOXACIN HCL 500 MG PO TABS: 500.0000 mg | ORAL_TABLET | Freq: Two times a day (BID) | ORAL | Status: DC

## 2014-12-23 MED ORDER — METRONIDAZOLE 500 MG PO TABS: 500.0000 mg | ORAL_TABLET | Freq: Two times a day (BID) | ORAL | Status: DC

## 2014-12-28 ENCOUNTER — Telehealth: Payer: Self-pay | Admitting: Internal Medicine

## 2014-12-28 ENCOUNTER — Telehealth: Payer: Self-pay | Admitting: *Deleted

## 2014-12-28 NOTE — Telephone Encounter (Signed)
Pt LMOM on 12/28/14 at 9:09am. He stated that he has a pacemaker and it the "motor is running". He stated that he was started on two antibiotics by Dr. Anitra Lauth and wants to know if the antibiotics could be causing this or should he contact his cardiologist. It looks like he has left a message for his cardiologist but they have not replied yet. Please advise. Thanks.

## 2014-12-28 NOTE — Telephone Encounter (Signed)
Most recent remote received today 12/28/14 _0 :36am. Pt has a "motor" sensation when laying down with arm crossed over chest. Remote shows no AF or atrial episodes. Pt taking xarelto as instructed. Pt BiVpacing 100% of time. Sensation is not a thumping, it's a "motoring." I advised pt from a device standpoint, no issue is identifiable. We discussed the potential that another non-electrical cardiac issue may be occurring or a non-cardiac concern altogether. Pt okay to wait to see Dr. Caryl Comes in two weeks to discuss further. Pt will keep a log of his sensations including date/time/frequency/duration & when he took his meds.   Pt aware ROV w/ Dr. Caryl Comes 01/11/15 _1 :15.

## 2014-12-28 NOTE — Telephone Encounter (Signed)
New message      Pt has a Secretary/administrator.  Not sure what he is talking about----he said when he would lie down and put his wrist on his chest, he would hear a "motor like" sound.  Is this normal?

## 2014-12-28 NOTE — Telephone Encounter (Signed)
Spoke with pt.  Abd pain is much better since cipro and flagyl started. Denies palpitations or feeling of defibrillator firing.  Got ICD interrogated this morning and no abnormality noted. He just heard a low level humming/vibratory sound in chest a couple times in the last week. He feels reassured and will continue taking current meds.

## 2015-01-11 ENCOUNTER — Encounter: Payer: Self-pay | Admitting: Internal Medicine

## 2015-01-11 ENCOUNTER — Ambulatory Visit (INDEPENDENT_AMBULATORY_CARE_PROVIDER_SITE_OTHER): Payer: Medicare Other | Admitting: Internal Medicine

## 2015-01-11 ENCOUNTER — Encounter: Payer: Self-pay | Admitting: *Deleted

## 2015-01-11 VITALS — BP 128/82 | HR 60 | Ht 72.0 in | Wt 231.8 lb

## 2015-01-11 DIAGNOSIS — I255 Ischemic cardiomyopathy: Secondary | ICD-10-CM | POA: Diagnosis not present

## 2015-01-11 DIAGNOSIS — Z79899 Other long term (current) drug therapy: Secondary | ICD-10-CM | POA: Diagnosis not present

## 2015-01-11 NOTE — Patient Instructions (Addendum)
Medication Instructions: - no changes  Labwork: - none  Procedures/Testing: - Your physician has recommended that you have a pulmonary function test. Pulmonary Function Tests are a group of tests that measure how well air moves in and out of your lungs.  - Your physician has recommended that you have an AV optimization echo Corporate investment banker). During this procedure, an echocardiogram is performed to optimize the timing of your device using ultrasound and a device programmer. Changes will be made to the device settings to help the heart chambers pump more efficiently. This procedure takes approximately one hour.  Follow-Up: - Remote monitoring is used to monitor your Pacemaker of ICD from home. This monitoring reduces the number of office visits required to check your device to one time per year. It allows [place] to keep an eye on the functioning of your device to ensure it is working properly. You are scheduled for a device check from home on 04/12/15. You may send your transmission at any time that day. If you have a wireless device, the transmission will be sent automatically. After your physician reviews your transmission, you will receive a postcard with your next transmission date.  - Your physician wants you to follow-up in: 6 months with Joel Marshall, NP for Dr. Gari Crown will receive a reminder letter in the mail two months in advance. If you don't receive a letter, please call our office to schedule the follow-up appointment.  Any Additional Special Instructions Will Be Listed Below (If Applicable).

## 2015-01-11 NOTE — Progress Notes (Signed)
Patient Care Team: [name], MD as PCP - General (Family Medicine) Festus Aloe, MD as Consulting Physician (Urology) Kathee Delton, MD as Consulting Physician (Pulmonary Disease) Clarene Essex, MD as Consulting Physician (Gastroenterology) Jerline Pain, MD as Consulting Physician (Cardiology) Michael Boston, MD as Consulting Physician (General Surgery) [name], MD as Consulting Physician (Cardiology)   HPI  [name] is a 74 y.o. male Seen in followup for congestive heart failure in the setting of ischemic heart disease and previously implanted CRT-D with subsequent left ventricular lead macro dislodgment. His symptoms however been quite stable. When he saw Dr. Lovena Le concerning lead extraction and lead implantation he was doing as well as he had for some time and it was decided not to pursue it then.  He also has ventricular tachycardia for which he takes amiodarone. LFTs were abnormal and his Crestor was stopped. Repeat LFTs remained elevated when they were last checked in August.  Last cardiac catheterization in October 2008 showed an occluded right coronary artery with left-to-right collateralization. Ejection fraction is 20%. There was no other coronary disease noted. Last Myoview in June of 2010 showed prior inferior infarct but no ischemia. His ejection fraction was 21%previous carotid Dopplers in April 2006 showed 0-39% stenosis bilaterally. Previous abdominal ultrasound in April 2006 showed no aneurysm.  Because of symptoms of congestive heart failure he underwent CRT upgrade by Dr. Elliot Cousin with subsequent extraction of the previously implanted failed LV lead.   Initially, he was not impressed at improvement. Nor was his wife. We undertook AV optimization and this is been associated with a vast improvement in his functional status  More recently he has had problems with exercise intolerance again this is unassociated with edema. Chest x-rays were obtained for  pulmonary assessment of amiodarone toxicity. They were reviewed. See below.  Surveillance laboratories were reviewed 10/16 and room normal    Past Medical History  Diagnosis Date  . Atrial fibrillation ([place])     Ablation for a-flutter 05/2000  . Cardiomyopathy     Ischemic.  EF 20%; pt has CRT-Device  . Hypertension   . Hyperlipidemia   . Allergic rhinitis   . Obstructive sleep apnea     CPAP 16  . CAD (coronary artery disease)     PTCA RCA 1997 (Inf/post MI);  [name] Ventricular tachycardia ([place])     ICD, pacer  . Adenomatous colon polyp 2009; 06/2012    Colonoscopy 07/2007---Dr. Barron Schmid GI--Repeat 06/2012 showed one tubular adenoma w/out high grade dysplasia.  . Nephrolithiasis     No distal stones on CT 2010; stable 8 mm RLP stone, 40m LMP stone 10/2013 plain film.  Abd u/s 06/2014: 9 mm nonobstructing lower pole right renal stone.  . [place]   . Microscopic hematuria     Cysto neg 02/2009 except for BPH-likely has microhem from his renal stone dz  . Diverticulosis of colon 2005    Noted on Colonoscopies in 2005 and 2009 and on CT 2010  . Internal hemorrhoids     Colonoscopy 07/2007  . Hepatic steatosis 2010; 2016    Noted on CT abd 2010 and on u/s abd 04/2009 (transaminasemia) and u/s 06/2014.  .Marland KitchenAllergic rhinitis   . BPH (benign prostatic hypertrophy)     PSAs ok (followed by urologist): no LUTS  . History of impaired glucose tolerance   . Shortness of breath   . CHF (congestive heart failure) ([place]   . Pacemaker  pacermaker /ICD/CRT---LV lead replacement 12/2012.  F/u echo showed mild improvement in mitral regurg but EF still 10%.  . Paronychia of right thumb 08/22/2012  . Gallstones 06/2014    No evidence of cholecystitis on abd u/s 06/2014  . Chronic constipation     Past Surgical History  Procedure Laterality Date  . Icd      a) Guidant Contak H170- Virl Axe, MD 07/25/06 (second device) b) Medtronic CRT-D  . Breast surgery  1990    "Tissue  removed from breast"  . Umbilical hernia repair    . Hernia repair  1975    bilateral inguinal  . Extracorporeal shock wave lithotripsy    . Atrial ablation surgery    . Cardiac catheterization    . Esophagogastroduodenoscopy N/A 07/13/2012    Procedure: ESOPHAGOGASTRODUODENOSCOPY (EGD);  Surgeon: Jeryl Columbia, MD;  Location: [facility];  Service: Endoscopy;  Laterality: N/A;  NORMAL  . Colonoscopy N/A 07/13/2012    Diverticula ascending colon.  Polyp x 1-recall 5 yrs (Dr. Watt Climes) Procedure: COLONOSCOPY;  Surgeon: Jeryl Columbia, MD;  Location: [facility];  Service: Endoscopy;  Laterality: N/A; polypectomy x 1   . Carotid doppler  09/25/12    No signif stenosis  . Lead revision Bilateral 12/26/2011    Procedure: LEAD REVISION;  Surgeon: Evans Lance, MD;  Location: [facility] C[facility] LAB;  Service: Cardiovascular;  Laterality: Bilateral;  . Transthoracic echocardiogram  01/2013    EF 20%, multiple areas of LV akinesis, no signif valvular dz, grade 1 diast dysfxn    Current Outpatient Prescriptions  Medication Sig Dispense Refill  . albuterol (VENTOLIN HFA) 108 (90 BASE) MCG/ACT inhaler Inhale 2 puffs into the lungs every 6 (six) hours as needed for shortness of breath. 18 g 2  . amiodarone (PACERONE) 200 MG tablet Take 1 tablet (200 mg total) by mouth daily. 90 tablet 3  . carvedilol (COREG) 12.5 MG tablet TAKE 1 TABLET BY MOUTH TWICE DAILY WITH A MEAL 180 tablet 0  . eplerenone (INSPRA) 25 MG tablet Take 1 tablet (25 mg total) by mouth daily. 90 tablet 3  . fluticasone (FLONASE) 50 MCG/ACT nasal spray INSTILL 2 SPRAYS INTO EACH NOSTRIL TWICE DAILY 96 g 3  . furosemide (LASIX) 40 MG tablet Take 2 tablets (80 mg total) by mouth daily. 180 tablet 3  . levocetirizine (XYZAL) 5 MG tablet Take 5 mg by mouth every evening.    . Magnesium 250 MG TABS Take 1 tablet by mouth daily.    [name] mexiletine (MEXITIL) 150 MG capsule Take 2 capsules (300 mg total) by mouth 2 (two) times daily. 360 capsule 3  . Multiple  Vitamin (MULTIVITAMIN WITH MINERALS) TABS Take 1 tablet by mouth daily.    . nitroGLYCERIN (NITROSTAT) 0.4 MG SL tablet Place 1 tablet (0.4 mg total) under the tongue every 5 (five) minutes as needed for chest pain. 25 tablet 3  . polyvinyl alcohol-povidone (REFRESH) 1.4-0.6 % ophthalmic solution Place 1-2 drops into both eyes as needed (dry eyes).     . potassium chloride SA (K-DUR,KLOR-CON) 20 MEQ tablet TAKE 1 TABLET BY MOUTH EVERY DAY 90 tablet 1  . Psyllium (METAMUCIL PO) Take 3 teaspoons by mouth 3 times daily    . valsartan (DIOVAN) 40 MG tablet Take 1 tablet (40 mg total) by mouth daily. 90 tablet 0  . XARELTO 20 MG TABS tablet TAKE 1 TABLET(20 MG) BY MOUTH DAILY WITH DINNER 30 tablet 11   No current facility-administered medications for this  visit.    Allergies  Allergen Reactions  . Sulfa Antibiotics Rash    Review of Systems negative except from HPI and PMH  Physical Exam BP 128/82 mmHg  Pulse 60  Ht 6' (1.829 m)  Wt 231 lb 12.8 oz (105.144 kg)  BMI 31.43 kg/m2 Well developed and well nourished in no acute distress HENT normal E scleral and icterus clear Neck Supple JVP flat; carotids brisk and full Clear to ausculation  Device pocket well healed; without hematoma or erythema.  There is no tethering Regular rate and rhythm, no murmurs gallops or rub Soft with active bowel sounds No clubbing cyanosis none  Edema Alert and oriented, grossly normal motor and sensory function Skin Warm and Dry  ECG demonstrates P synchronous biventricular pacing ECG was compared with the post implant and there is a slight diminution in the R wave in lead V1. The chest x-rays were reviewed from 2013 in 2016 and demonstrated no significant interval change in location of the LV lead  Assessment and  Plan\  Ventricular tachycardia  Ischemic cardiomyopathy  Congestive heart failure-chronic-systolic  CRT-D-St. Jude's  Atrial fibrillation  Overall he is doing great. He is euvolemic.  There is no symptoms of ischemia. No intercurrent ventricular tachycardia. Continue current medications.  His modest worsening shortness of breath prompts AV optimization echo I also worry about amiodarone lung toxicity will undertake coronary function testing with a DLCO  No significant interval atrial fibrillation and no ventricular tachycardia

## 2015-01-12 LAB — CUP PACEART INCLINIC DEVICE CHECK
Brady Statistic RA Percent Paced: 100 %
Brady Statistic RV Percent Paced: 100 %
HighPow Impedance: 54 Ohm
Implantable Lead Implant Date: 20131212
Implantable Lead Location: 753860
Implantable Lead Model: 148
Implantable Lead Model: 6940
Implantable Lead Serial Number: 120244
Lead Channel Impedance Value: 742 Ohm
Lead Channel Pacing Threshold Amplitude: 0.7 V
Lead Channel Pacing Threshold Amplitude: 1.2 V
Lead Channel Pacing Threshold Amplitude: 1.9 V
Lead Channel Pacing Threshold Pulse Width: 0.3 ms
Lead Channel Pacing Threshold Pulse Width: 0.4 ms
Lead Channel Setting Pacing Amplitude: 2 V
Lead Channel Setting Pacing Amplitude: 2.5 V
Lead Channel Setting Pacing Pulse Width: 0.4 ms
Lead Channel Setting Sensing Sensitivity: 1 mV
MDC IDC LEAD IMPLANT DT: 20020527
MDC IDC LEAD IMPLANT DT: 20020527
MDC IDC LEAD LOCATION: 753858
MDC IDC LEAD LOCATION: 753859
MDC IDC LEAD MODEL: 4194
MDC IDC MSMT BATTERY REMAINING LONGEVITY: 60 mo
MDC IDC MSMT LEADCHNL LV IMPEDANCE VALUE: 882 Ohm
MDC IDC MSMT LEADCHNL LV PACING THRESHOLD PULSEWIDTH: 0.7 ms
MDC IDC MSMT LEADCHNL RA IMPEDANCE VALUE: 572 Ohm
MDC IDC MSMT LEADCHNL RA SENSING INTR AMPL: 4.5 mV
MDC IDC PG SERIAL: 492371
MDC IDC SESS DTM: 20161229170845
MDC IDC SET LEADCHNL LV PACING AMPLITUDE: 2.5 V
MDC IDC SET LEADCHNL LV PACING PULSEWIDTH: 0.5 ms
MDC IDC SET LEADCHNL RV SENSING SENSITIVITY: 0.5 mV

## 2015-01-24 ENCOUNTER — Ambulatory Visit (HOSPITAL_COMMUNITY): Payer: Medicare Other

## 2015-02-01 DIAGNOSIS — L821 Other seborrheic keratosis: Secondary | ICD-10-CM | POA: Diagnosis not present

## 2015-02-06 ENCOUNTER — Other Ambulatory Visit: Payer: Self-pay | Admitting: Emergency Medicine

## 2015-02-07 ENCOUNTER — Encounter: Payer: Self-pay | Admitting: Internal Medicine

## 2015-02-07 ENCOUNTER — Other Ambulatory Visit: Payer: Self-pay | Admitting: Internal Medicine

## 2015-02-07 ENCOUNTER — Other Ambulatory Visit: Payer: Self-pay

## 2015-02-07 ENCOUNTER — Ambulatory Visit (HOSPITAL_COMMUNITY): Payer: Medicare Other | Attending: Cardiovascular Disease

## 2015-02-07 DIAGNOSIS — I071 Rheumatic tricuspid insufficiency: Secondary | ICD-10-CM | POA: Insufficient documentation

## 2015-02-07 DIAGNOSIS — Z6831 Body mass index (BMI) 31.0-31.9, adult: Secondary | ICD-10-CM | POA: Diagnosis not present

## 2015-02-07 DIAGNOSIS — I371 Nonrheumatic pulmonary valve insufficiency: Secondary | ICD-10-CM | POA: Diagnosis not present

## 2015-02-07 DIAGNOSIS — I517 Cardiomegaly: Secondary | ICD-10-CM | POA: Diagnosis not present

## 2015-02-07 DIAGNOSIS — I10 Essential (primary) hypertension: Secondary | ICD-10-CM | POA: Diagnosis not present

## 2015-02-07 DIAGNOSIS — R29898 Other symptoms and signs involving the musculoskeletal system: Secondary | ICD-10-CM | POA: Diagnosis not present

## 2015-02-07 DIAGNOSIS — E669 Obesity, unspecified: Secondary | ICD-10-CM | POA: Diagnosis not present

## 2015-02-07 DIAGNOSIS — I340 Nonrheumatic mitral (valve) insufficiency: Secondary | ICD-10-CM | POA: Diagnosis not present

## 2015-02-07 DIAGNOSIS — I255 Ischemic cardiomyopathy: Secondary | ICD-10-CM | POA: Insufficient documentation

## 2015-02-07 DIAGNOSIS — Z87891 Personal history of nicotine dependence: Secondary | ICD-10-CM | POA: Diagnosis not present

## 2015-02-08 ENCOUNTER — Ambulatory Visit (INDEPENDENT_AMBULATORY_CARE_PROVIDER_SITE_OTHER): Payer: Medicare Other | Admitting: Internal Medicine

## 2015-02-08 DIAGNOSIS — Z79899 Other long term (current) drug therapy: Secondary | ICD-10-CM | POA: Diagnosis not present

## 2015-02-08 LAB — PULMONARY FUNCTION TEST
DL/VA % pred: 90 %
DL/VA: 4.23 ml/min/mmHg/L
DLCO UNC % PRED: 56 %
DLCO UNC: 19.06 ml/min/mmHg
FEF 25-75 PRE: 2.68 L/s
FEF 25-75 Post: 2.71 L/sec
FEF2575-%Change-Post: 1 %
FEF2575-%PRED-PRE: 111 %
FEF2575-%Pred-Post: 112 %
FEV1-%Change-Post: 0 %
FEV1-%PRED-POST: 79 %
FEV1-%Pred-Pre: 80 %
FEV1-POST: 2.61 L
FEV1-Pre: 2.61 L
FEV1FVC-%CHANGE-POST: 0 %
FEV1FVC-%Pred-Pre: 111 %
FEV6-%CHANGE-POST: -1 %
FEV6-%PRED-POST: 75 %
FEV6-%PRED-PRE: 76 %
FEV6-POST: 3.16 L
FEV6-Pre: 3.2 L
FEV6FVC-%CHANGE-POST: 0 %
FEV6FVC-%PRED-POST: 105 %
FEV6FVC-%Pred-Pre: 106 %
FVC-%Change-Post: 0 %
FVC-%Pred-Post: 71 %
FVC-%Pred-Pre: 71 %
FVC-Post: 3.19 L
FVC-Pre: 3.2 L
POST FEV6/FVC RATIO: 99 %
PRE FEV1/FVC RATIO: 82 %
Post FEV1/FVC ratio: 82 %
Pre FEV6/FVC Ratio: 100 %
RV % pred: 44 %
RV: 1.15 L
TLC % PRED: 63 %
TLC: 4.59 L

## 2015-02-08 NOTE — Progress Notes (Signed)
PFT done today.

## 2015-02-13 ENCOUNTER — Other Ambulatory Visit: Payer: Self-pay | Admitting: Family Medicine

## 2015-02-13 NOTE — Telephone Encounter (Signed)
RF request for levocetirizine LOV: 12/15/14 Next ov: None Last written: 01/13/14 #30 w/ 6RF  Please advise. Thanks.

## 2015-02-15 HISTORY — PX: OTHER SURGICAL HISTORY: SHX169

## 2015-02-16 ENCOUNTER — Encounter: Payer: Self-pay | Admitting: Family Medicine

## 2015-02-17 ENCOUNTER — Telehealth: Payer: Self-pay | Admitting: *Deleted

## 2015-02-17 ENCOUNTER — Ambulatory Visit (INDEPENDENT_AMBULATORY_CARE_PROVIDER_SITE_OTHER): Payer: Medicare Other | Admitting: Gastroenterology

## 2015-02-17 ENCOUNTER — Encounter: Payer: Self-pay | Admitting: Gastroenterology

## 2015-02-17 VITALS — BP 126/80 | HR 68 | Ht 69.5 in | Wt 231.0 lb

## 2015-02-17 DIAGNOSIS — K5900 Constipation, unspecified: Secondary | ICD-10-CM | POA: Diagnosis not present

## 2015-02-17 DIAGNOSIS — R1032 Left lower quadrant pain: Secondary | ICD-10-CM | POA: Diagnosis not present

## 2015-02-17 DIAGNOSIS — I255 Ischemic cardiomyopathy: Secondary | ICD-10-CM | POA: Diagnosis not present

## 2015-02-17 DIAGNOSIS — K5730 Diverticulosis of large intestine without perforation or abscess without bleeding: Secondary | ICD-10-CM | POA: Diagnosis not present

## 2015-02-17 NOTE — Patient Instructions (Addendum)
Use Miralax over the counter Use Metamucil over the counter Follow up as needed We will obtain approval for for you to come off Xarelto for your upcoming procedure in July, to avoid another office appointment

## 2015-02-17 NOTE — Telephone Encounter (Signed)
THIS PATIENT HAS A RECALL IN July WAS SEEN IN THE OFFICE TODAY   02/17/2015 WILL NOT NEED ANOTHER OV WITH DR Silverio Decamp  APPROVED TO HOLD XARELTO ALREADY DONE  FOR PREVISIT

## 2015-02-17 NOTE — Telephone Encounter (Signed)
Hold xarelto 2 days prior to procedure and resume day after [place]

## 2015-02-17 NOTE — Progress Notes (Signed)
[name]    [id number]    08-12-1940  Primary Care Physician:MCGOWEN,PHILIP H, MD  Referring Physician: [name], MD [address] [place], [place] [address]  Chief complaint:  Chronic constipation, diverticulosis  HPI: 75 year old male with chronic history of constipation and diverticulosis here to establish care. He was previously followed by Dr. Watt Climes [facility]). Would like to switch his care to our practice as his primary care and pulmonary are [facility]. He has episodic LLQ abdominal pain, has improved since he stopped eats nuts and excessive fiber. He is currently on Metamucil. Continues to have intermittent constipation. Denies any nausea or vomiting. No melena or BRBPR. He reports having a worm on wiping, O&P negative on stool done by Dr Anitra Lauth.   Outpatient Encounter Prescriptions as of 02/17/2015  Medication Sig  . amiodarone (PACERONE) 200 MG tablet Take 1 tablet (200 mg total) by mouth daily.  . carvedilol (COREG) 12.5 MG tablet TAKE 1 TABLET BY MOUTH TWICE DAILY WITH A MEAL  . eplerenone (INSPRA) 25 MG tablet Take 1 tablet (25 mg total) by mouth daily.  . fluticasone (FLONASE) 50 MCG/ACT nasal spray INSTILL 2 SPRAYS INTO EACH NOSTRIL TWICE DAILY  . furosemide (LASIX) 40 MG tablet Take 2 tablets (80 mg total) by mouth daily.  [name] levocetirizine (XYZAL) 5 MG tablet TAKE 1 TABLET BY MOUTH EVERY MORNING  . Magnesium 250 MG TABS Take 1 tablet by mouth daily.  [name] mexiletine (MEXITIL) 150 MG capsule Take 2 capsules (300 mg total) by mouth 2 (two) times daily.  . Multiple Vitamin (MULTIVITAMIN WITH MINERALS) TABS Take 1 tablet by mouth daily.  . nitroGLYCERIN (NITROSTAT) 0.4 MG SL tablet Place 1 tablet (0.4 mg total) under the tongue every 5 (five) minutes as needed for chest pain.  . polyvinyl alcohol-povidone (REFRESH) 1.4-0.6 % ophthalmic solution Place 1-2 drops into both eyes as needed (dry eyes).   . potassium chloride SA (K-DUR,KLOR-CON) 20  MEQ tablet TAKE 1 TABLET BY MOUTH EVERY DAY  . Psyllium (METAMUCIL PO) Take 3 teaspoons by mouth 3 times daily  . valsartan (DIOVAN) 40 MG tablet Take 1 tablet (40 mg total) by mouth daily.  Alveda Reasons 20 MG TABS tablet TAKE 1 TABLET(20 MG) BY MOUTH DAILY WITH DINNER  . [DISCONTINUED] albuterol (VENTOLIN HFA) 108 (90 BASE) MCG/ACT inhaler Inhale 2 puffs into the lungs every 6 (six) hours as needed for shortness of breath.   No facility-administered encounter medications on file as of 02/17/2015.    Allergies as of 02/17/2015 - Review Complete 02/17/2015  Allergen Reaction Noted  . Sulfa antibiotics Rash 04/04/2010    Past Medical History  Diagnosis Date  . Atrial fibrillation ([place])     Ablation for a-flutter 05/2000  . Cardiomyopathy     Ischemic.  EF 20%; pt has CRT-Device  . Hypertension   . Hyperlipidemia   . Allergic rhinitis   . Obstructive sleep apnea     CPAP 16  . CAD (coronary artery disease)     PTCA RCA 1997 (Inf/post MI);  [name] Ventricular tachycardia ([place])     ICD, pacer  . Adenomatous colon polyp 2009; 06/2012    Colonoscopy 07/2007---Dr. Barron Schmid GI--Repeat 06/2012 showed one tubular adenoma w/out high grade dysplasia.  . Nephrolithiasis     No distal stones on CT 2010; stable 8 mm RLP stone, 78m LMP stone 10/2013 plain film.  Abd u/s 06/2014: 9 mm nonobstructing lower  pole right renal stone.  . Myocardial infarction ([place])   . Microscopic hematuria     Cysto neg 02/2009 except for BPH-likely has microhem from his renal stone dz  . Diverticulosis of colon 2005    Noted on Colonoscopies in 2005 and 2009 and on CT 2010  . Internal hemorrhoids     Colonoscopy 07/2007  . Hepatic steatosis 2010; 2016    Noted on CT abd 2010 and on u/s abd 04/2009 (transaminasemia) and u/s 06/2014.  [name] Allergic rhinitis   . BPH (benign prostatic hypertrophy)     PSAs ok (followed by urologist): no LUTS  . History of impaired glucose tolerance   . Shortness of breath   . CHF (congestive  heart failure) ([place])   . Pacemaker     pacermaker /ICD/CRT---LV lead replacement 12/2012.  F/u echo showed mild improvement in mitral regurg but EF still 10%.  . Paronychia of right thumb 08/22/2012  . Gallstones 06/2014    No evidence of cholecystitis on abd u/s 06/2014  . Chronic constipation     Past Surgical History  Procedure Laterality Date  . Icd      a) Guidant Contak H170- Virl Axe, MD 07/25/06 (second device) b) Medtronic CRT-D  . Breast surgery Left 1990    "Tissue removed from breast"  . Inguinal hernia repair Bilateral 1975  . Extracorporeal shock wave lithotripsy    . Atrial ablation surgery    . Cardiac catheterization    . Esophagogastroduodenoscopy N/A 07/13/2012    Procedure: ESOPHAGOGASTRODUODENOSCOPY (EGD);  Surgeon: Jeryl Columbia, MD;  Location: [facility];  Service: Endoscopy;  Laterality: N/A;  NORMAL  . Colonoscopy N/A 07/13/2012    Diverticula ascending colon.  Polyp x 1-recall 5 yrs (Dr. Watt Climes) Procedure: COLONOSCOPY;  Surgeon: Jeryl Columbia, MD;  Location: [facility];  Service: Endoscopy;  Laterality: N/A; polypectomy x 1   . Carotid doppler  09/25/12    No signif stenosis  . Lead revision Bilateral 12/26/2011    Procedure: LEAD REVISION;  Surgeon: Evans Lance, MD;  Location[facility] CATH LAB;  Service: Cardiovascular;  Laterality: Bilateral;  . Transthoracic echocardiogram  01/2013    EF 20%, multiple areas of LV akinesis, no signif valvular dz, grade 1 diast dysfxn  . Pfts  02/2015    Mod restriction (interstitial), mod diffusion defect (Dr. Annamaria Boots)  . Pacemaker placement      with defibulator    Family History  Problem Relation Age of Onset  . Heart disease Mother   . Breast cancer Maternal Grandmother   . Coronary artery disease Father     family hx of  . Heart attack Father     4 stents/ pacemaker  . Hypertension Sister   . Goiter Paternal Grandmother   . Nephrolithiasis Son   . Other Mother     colon surgery for fistula  . Nephrolithiasis  Father     Social History   Social History  . Marital Status: Married    Spouse Name: N/A  . Number of Children: 3  . Years of Education: N/A   Occupational History  . Retired    Social History Main Topics  . Smoking status: Former Smoker -- 2.00 packs/day for 15 years    Types: Cigarettes    Quit date: 01/15/1980  . Smokeless tobacco: Never Used  . Alcohol Use: 0.0 oz/week    0 Standard drinks or equivalent per week     Comment: couple glasses of wine a week  .  Drug Use: No  . Sexual Activity: Not on file   Other Topics Concern  . Not on file   Social History Narrative   Married.   Hx of cigarettes: quit in the 1980s.  No alc/drugs.      Review of systems: Review of Systems  Constitutional: Negative for fever and chills.  HENT: Negative.   Eyes: Negative for blurred vision.  Respiratory: Negative for cough, shortness of breath and wheezing.   Cardiovascular: Negative for chest pain and palpitations.  Gastrointestinal: as per HPI Genitourinary: Negative for dysuria, urgency, frequency and hematuria.  Musculoskeletal: Negative for myalgias, back pain and joint pain.  Skin: Negative for itching and rash.  Neurological: Negative for dizziness, tremors, focal weakness, seizures and loss of consciousness.  Endo/Heme/Allergies: Negative for environmental allergies.  Psychiatric/Behavioral: Negative for depression, suicidal ideas and hallucinations.  All other systems reviewed and are negative.   Physical Exam: Filed Vitals:   02/17/15 1027  BP: 126/80  Pulse: 68   Gen:      No acute distress HEENT:  EOMI, sclera anicteric Neck:     No masses; no thyromegaly Lungs:    Clear to auscultation bilaterally; normal respiratory effort CV:         Regular rate and rhythm; no murmurs Abd:      + bowel sounds; soft, non-tender; no palpable masses, no distension Ext:    No edema; adequate peripheral perfusion Skin:      Warm and dry; no rash Neuro: alert and oriented x  3 Psych: normal mood and affect  Data Reviewed:  Colonoscopy 06/2012 Sigmoid diverticulosis. Mid transverse colon polyp (tubular adenoma)   Assessment and Plan/Recommendations:  75 year old male here to establish care with history of sigmoid diverticulosis and complaints of chronic constipation with intermittent left lower quadrant abdominal pain Patient's symptoms have improved with dietary modification by avoid fiber intake Continue Metamucil as needed Start Miralax 1 capful daily and titrate as needed Due for recall colonoscopy in July 2017, will schedule it and will discuss with cardiology regarding holding anticoagulation prior to the procedure Return in 6 months  K. Denzil Magnuson , MD [phone] Mon-Fri 8a-5p [phone] after 5p, weekends, holidays

## 2015-02-17 NOTE — Telephone Encounter (Signed)
  02/17/2015   RE: AXL RODINO DOB: 01/17/1940 MRN: [id number]   Dear Dr. Stanford Breed    We have scheduled the above patient for an endoscopic procedure. Our records show that he is on anticoagulation therapy.   Please advise as to how long the patient may come off his therapy of Xarelto prior to the procedure, which is scheduled for (This will not be until July).  Please fax back/ or route the completed form to [facility] at [phone].   Sincerely,    Genella Mech

## 2015-02-21 NOTE — Addendum Note (Signed)
Addended by: Mauri Pole on: 02/21/2015 03:31 PM   Modules accepted: Level of Service

## 2015-03-02 ENCOUNTER — Telehealth: Payer: Self-pay | Admitting: Internal Medicine

## 2015-03-02 NOTE — Telephone Encounter (Signed)
Patient stated that he bought the Pulmonary function test in for you on 03/02/15   Thanks

## 2015-03-06 ENCOUNTER — Other Ambulatory Visit: Payer: Self-pay | Admitting: Cardiology

## 2015-03-06 NOTE — Telephone Encounter (Signed)
REFILL

## 2015-03-13 ENCOUNTER — Ambulatory Visit (INDEPENDENT_AMBULATORY_CARE_PROVIDER_SITE_OTHER): Payer: Medicare Other | Admitting: Family Medicine

## 2015-03-13 ENCOUNTER — Encounter: Payer: Self-pay | Admitting: Family Medicine

## 2015-03-13 VITALS — BP 107/72 | HR 56 | Temp 97.7°F | Resp 16 | Ht 69.5 in | Wt 235.0 lb

## 2015-03-13 DIAGNOSIS — J310 Chronic rhinitis: Secondary | ICD-10-CM | POA: Diagnosis not present

## 2015-03-13 DIAGNOSIS — R05 Cough: Secondary | ICD-10-CM

## 2015-03-13 DIAGNOSIS — I255 Ischemic cardiomyopathy: Secondary | ICD-10-CM | POA: Diagnosis not present

## 2015-03-13 DIAGNOSIS — R053 Chronic cough: Secondary | ICD-10-CM

## 2015-03-13 DIAGNOSIS — R942 Abnormal results of pulmonary function studies: Secondary | ICD-10-CM | POA: Diagnosis not present

## 2015-03-13 MED ORDER — ALBUTEROL SULFATE HFA 108 (90 BASE) MCG/ACT IN AERS: 1.0000 | INHALATION_SPRAY | RESPIRATORY_TRACT | Status: DC | PRN

## 2015-03-13 MED ORDER — IPRATROPIUM BROMIDE 0.06 % NA SOLN: 2.0000 | Freq: Four times a day (QID) | NASAL | Status: DC

## 2015-03-13 NOTE — Progress Notes (Signed)
Pre visit review using our clinic review tool, if applicable. No additional management support is needed unless otherwise documented below in the visit note.

## 2015-03-13 NOTE — Progress Notes (Signed)
OFFICE VISIT  03/13/2015   CC:  Chief Complaint  Patient presents with  . URI    x 2-3 years   HPI:    Patient is a 75 y.o. Caucasian male who presents for "sinus issues" that he says he feels "year 'round" for the last couple years.  Nose runs all the time, coughs up phlegm. Has run out of some of the chronic meds he was on through the pulm clinic--his pulmonologist was Dr. Gwenette Greet and he has left town to practice elsewhere. He is set up to see Dr. Malvin Johns with pulm and Dr. Erik Obey with ENT.  He asks if I can re-prescribe ipratropium nasal spray and ventolin HFA that pulm clinic had him on in the past.  Denies SOB or CP or fever. He does have a pretty persistent PND cough, but also shows me results of PFTs that Dr. Caryl Comes ordered on him and these did show some evidence of restrictive dz but no obstructive dz.   Past Medical History  Diagnosis Date  . Atrial fibrillation ([place])     Ablation for a-flutter 05/2000  . Cardiomyopathy     Ischemic.  EF 20%; pt has CRT-Device  . Hypertension   . Hyperlipidemia   . Allergic rhinitis   . Obstructive sleep apnea     CPAP 16  . CAD (coronary artery disease)     PTCA RCA 1997 (Inf/post MI);  [name] Ventricular tachycardia ([place])     ICD, pacer  . Adenomatous colon polyp 2009; 06/2012    Colonoscopy 07/2007---Dr. Barron Schmid GI--Repeat 06/2012 showed one tubular adenoma w/out high grade dysplasia.  . Nephrolithiasis     No distal stones on CT 2010; stable 8 mm RLP stone, 18m LMP stone 10/2013 plain film.  Abd u/s 06/2014: 9 mm nonobstructing lower pole right renal stone.  . [place]   . Microscopic hematuria     Cysto neg 02/2009 except for BPH-likely has microhem from his renal stone dz  . Diverticulosis of colon 2005    Noted on Colonoscopies in 2005 and 2009 and on CT 2010  . Internal hemorrhoids     Colonoscopy 07/2007  . Hepatic steatosis 2010; 2016    Noted on CT abd 2010 and on u/s abd 04/2009 (transaminasemia) and u/s  06/2014.  .Marland KitchenBPH (benign prostatic hypertrophy)     PSAs ok (followed by urologist): no LUTS  . History of impaired glucose tolerance   . Shortness of breath   . CHF (congestive heart failure) ([place]   . Pacemaker     pacermaker /ICD/CRT---LV lead replacement 12/2012.  F/u echo showed mild improvement in mitral regurg but EF still 10%.  . Paronychia of right thumb 08/22/2012  . Gallstones 06/2014    No evidence of cholecystitis on abd u/s 06/2014  . Chronic constipation     Past Surgical History  Procedure Laterality Date  . Icd      a) Guidant Contak H170- SVirl Axe MD 07/25/06 (second device) b) Medtronic CRT-D  . Breast surgery Left 1990    "Tissue removed from breast"  . Inguinal hernia repair Bilateral 1975  . Extracorporeal shock wave lithotripsy    . Atrial ablation surgery    . Cardiac catheterization    . Esophagogastroduodenoscopy N/A 07/13/2012    Procedure: ESOPHAGOGASTRODUODENOSCOPY (EGD);  Surgeon: MJeryl Columbia MD;  [facility];  Service: Endoscopy;  Laterality: N/A;  NORMAL  . Colonoscopy N/A 07/13/2012    Diverticula ascending colon.  Polyp x  1-recall 5 yrs (Dr. Watt Climes) Procedure: COLONOSCOPY;  Surgeon: Jeryl Columbia, MD;  Location: [facility];  Service: Endoscopy;  Laterality: N/A; polypectomy x 1   . Carotid doppler  09/25/12    No signif stenosis  . Lead revision Bilateral 12/26/2011    Procedure: LEAD REVISION;  Surgeon: Evans Lance, MD;  Location: [facility] CATH LAB;  Service: Cardiovascular;  Laterality: Bilateral;  . Transthoracic echocardiogram  01/2013    EF 20%, multiple areas of LV akinesis, no signif valvular dz, grade 1 diast dysfxn  . Pfts  02/2015    Mod restriction (interstitial), mod diffusion defect (Dr. Annamaria Boots)  . Pacemaker placement      with defibulator    Outpatient Prescriptions Prior to Visit  Medication Sig Dispense Refill  . amiodarone (PACERONE) 200 MG tablet Take 1 tablet (200 mg total) by mouth daily. 90 tablet 3  . carvedilol  (COREG) 12.5 MG tablet TAKE 1 TABLET BY MOUTH TWICE DAILY WITH A MEAL 180 tablet 1  . eplerenone (INSPRA) 25 MG tablet Take 1 tablet (25 mg total) by mouth daily. 90 tablet 3  . fluticasone (FLONASE) 50 MCG/ACT nasal spray INSTILL 2 SPRAYS INTO EACH NOSTRIL TWICE DAILY 96 g 3  . furosemide (LASIX) 40 MG tablet Take 2 tablets (80 mg total) by mouth daily. 180 tablet 3  . levocetirizine (XYZAL) 5 MG tablet TAKE 1 TABLET BY MOUTH EVERY MORNING 30 tablet 11  . Magnesium 250 MG TABS Take 1 tablet by mouth daily.    [name] mexiletine (MEXITIL) 150 MG capsule Take 2 capsules (300 mg total) by mouth 2 (two) times daily. 360 capsule 3  . Multiple Vitamin (MULTIVITAMIN WITH MINERALS) TABS Take 1 tablet by mouth daily.    . nitroGLYCERIN (NITROSTAT) 0.4 MG SL tablet Place 1 tablet (0.4 mg total) under the tongue every 5 (five) minutes as needed for chest pain. 25 tablet 3  . polyvinyl alcohol-povidone (REFRESH) 1.4-0.6 % ophthalmic solution Place 1-2 drops into both eyes as needed (dry eyes).     . potassium chloride SA (K-DUR,KLOR-CON) 20 MEQ tablet TAKE 1 TABLET BY MOUTH EVERY DAY 90 tablet 1  . Psyllium (METAMUCIL PO) Take 3 teaspoons by mouth 3 times daily    . valsartan (DIOVAN) 40 MG tablet Take 1 tablet (40 mg total) by mouth daily. 90 tablet 0  . XARELTO 20 MG TABS tablet TAKE 1 TABLET(20 MG) BY MOUTH DAILY WITH DINNER 30 tablet 11   No facility-administered medications prior to visit.    Allergies  Allergen Reactions  . Sulfa Antibiotics Rash    ROS As per HPI  PE: Blood pressure 107/72, pulse 56, temperature 97.7 F (36.5 C), temperature source Oral, resp. rate 16, height 5' 9.5" (1.765 m), weight 235 lb (106.595 kg), SpO2 95 %. Gen: Alert, well appearing.  Patient is oriented to person, place, time, and situation. No exam today.  LABS:  None today  IMPRESSION AND PLAN:  Chronic rhinitis, PND, and cough. I RF'd his flonase, ipratropium 0.6% nasal spray, and ventolin HFA. Regarding  his chronic rhinitis sx's he is set up to see an ENT, Dr.Wolicki, soon.  Regarding his recent abnormal PFTs 01/2015+ hx of mild obstructive lung dz, and hs chronic use of amiodarone, he is set up to resume pulm specialist care with Dr. Malvin Johns with [facility] soon.  An After Visit Summary was printed and given to the patient.  FOLLOW UP: Return if symptoms worsen or fail to improve.

## 2015-03-17 DIAGNOSIS — J310 Chronic rhinitis: Secondary | ICD-10-CM | POA: Diagnosis not present

## 2015-03-17 DIAGNOSIS — H903 Sensorineural hearing loss, bilateral: Secondary | ICD-10-CM | POA: Diagnosis not present

## 2015-03-17 DIAGNOSIS — J331 Polypoid sinus degeneration: Secondary | ICD-10-CM | POA: Diagnosis not present

## 2015-03-17 DIAGNOSIS — R05 Cough: Secondary | ICD-10-CM | POA: Diagnosis not present

## 2015-03-17 DIAGNOSIS — K219 Gastro-esophageal reflux disease without esophagitis: Secondary | ICD-10-CM | POA: Diagnosis not present

## 2015-03-17 DIAGNOSIS — H9113 Presbycusis, bilateral: Secondary | ICD-10-CM | POA: Diagnosis not present

## 2015-03-17 DIAGNOSIS — J329 Chronic sinusitis, unspecified: Secondary | ICD-10-CM | POA: Diagnosis not present

## 2015-03-22 ENCOUNTER — Telehealth: Payer: Self-pay

## 2015-03-22 DIAGNOSIS — N200 Calculus of kidney: Secondary | ICD-10-CM | POA: Diagnosis not present

## 2015-03-22 DIAGNOSIS — Z0000 Encounter for general adult medical examination without abnormal findings: Secondary | ICD-10-CM | POA: Diagnosis not present

## 2015-03-22 DIAGNOSIS — Z125 Encounter for screening for malignant neoplasm of prostate: Secondary | ICD-10-CM | POA: Diagnosis not present

## 2015-03-22 NOTE — Telephone Encounter (Signed)
Returned patient's vmail. Patient states Dr. Polly Cobia (ENT) stopped his fluticasone (FLONASE) 50 MCG/ACT nasal spray and increased ipratropium (ATROVENT) 0.06 % nasal spray to 8 times daily. Requesting directions on Atrovent changed and refills increased to reflect new Rx. Advised patient to contact Dr. Polly Cobia' s office to have med change updated since Dr. Polly Cobia increased the dosage. Patient verbalized understanding.

## 2015-03-23 ENCOUNTER — Telehealth: Payer: Self-pay | Admitting: Internal Medicine

## 2015-03-23 NOTE — Telephone Encounter (Signed)
I called and spoke with the patient. I made him aware that I had obtained a copy of his previous PFT's from his paper chart from 2008. Dr. Caryl Comes had reviewed this and stated that his DLCO has gone from ~ 80% in 2008 to ~56% now. Per Dr. Caryl Comes, we will continue the current course and recheck his PFT's in 6 months. The patient is scheduled to follow up with Dr. Lamonte Sakai on 3/14. We will wait assessment from Dr. Lamonte Sakai as well.

## 2015-03-25 ENCOUNTER — Encounter: Payer: Self-pay | Admitting: Family Medicine

## 2015-03-28 ENCOUNTER — Ambulatory Visit (INDEPENDENT_AMBULATORY_CARE_PROVIDER_SITE_OTHER): Payer: Medicare Other | Admitting: Emergency Medicine

## 2015-03-28 ENCOUNTER — Encounter: Payer: Self-pay | Admitting: Emergency Medicine

## 2015-03-28 VITALS — BP 108/68 | HR 64 | Ht 70.0 in | Wt 233.0 lb

## 2015-03-28 DIAGNOSIS — J984 Other disorders of lung: Secondary | ICD-10-CM | POA: Diagnosis not present

## 2015-03-28 DIAGNOSIS — G4733 Obstructive sleep apnea (adult) (pediatric): Secondary | ICD-10-CM | POA: Diagnosis not present

## 2015-03-28 DIAGNOSIS — J449 Chronic obstructive pulmonary disease, unspecified: Secondary | ICD-10-CM | POA: Diagnosis not present

## 2015-03-28 DIAGNOSIS — J849 Interstitial pulmonary disease, unspecified: Secondary | ICD-10-CM | POA: Insufficient documentation

## 2015-03-28 DIAGNOSIS — I255 Ischemic cardiomyopathy: Secondary | ICD-10-CM | POA: Diagnosis not present

## 2015-03-28 NOTE — Progress Notes (Signed)
Subjective:    Patient ID: [name], male    DOB: March 08, 1940, 75 y.o.   MRN: [id number]  HPI 75 year old former smoker with a history of hypertension, atrial fibrillation on amiodarone, coronary disease with ischemic cardiomyopathy with ICD/pacer, formerly followed by Dr Gwenette Greet for obstructive sleep apnea on CPAP 16 cm water. He stopped using CPAP about 2 years ago due to dryness. He has tried multiple masks and humidity, but has been unable to tolerate. He is fatigued, is up at night freq to urinate. He underwent PFT that I have reviewed, shows mild mixed disease, restrictive disease on volumes, decreased diffusion capacity that corects for [place].  He describes cough that is likely related to chronic rhintis, partially treated GERD. He recently changed from flonase to ipratropium which has helped. He still coughs up secretions in the am. He was just started on albuterol - seems to benefit from this.           Review of Systems As per HPI  Past Medical History  Diagnosis Date  . Atrial fibrillation ([place])     Ablation for a-flutter 05/2000  . Cardiomyopathy     Ischemic.  EF 20%; pt has CRT-Device  . Hypertension   . Hyperlipidemia   . Allergic rhinitis   . Obstructive sleep apnea     CPAP 16  . CAD (coronary artery disease)     PTCA RCA 1997 (Inf/post MI);  [name] Ventricular tachycardia ([place])     ICD, pacer  . Adenomatous colon polyp 2009; 06/2012    Colonoscopy 07/2007---Dr. Barron Schmid GI--Repeat 06/2012 showed one tubular adenoma w/out high grade dysplasia.  . Nephrolithiasis     No distal stones on CT 2010; stable 8 mm RLP stone, 7m LMP stone 10/2013 plain film.  Abd u/s 06/2014: 9 mm nonobstructing lower pole right renal stone.  03/2015 urol: stable nonobstructing renal calc by CT 12/2014 and KUB 03/2015.  .Marland KitchenMyocardial infarction ([place]   . Microscopic hematuria     Cysto neg 02/2009 except for BPH-likely has microhem from his renal stone dz  . Diverticulosis of colon 2005    Noted on  Colonoscopies in 2005 and 2009 and on CT 2010  . Internal hemorrhoids     Colonoscopy 07/2007  . Hepatic steatosis 2010; 2016    Noted on CT abd 2010 and on u/s abd 04/2009 (transaminasemia) and u/s 06/2014.  .Marland KitchenBPH (benign prostatic hypertrophy)     PSAs ok (followed by urologist): no LUTS  . History of impaired glucose tolerance   . Shortness of breath   . CHF ([place]estive heart failure) ([place]   . Pacemaker     pacermaker /ICD/CRT---LV lead replacement 12/2012.  F/u echo showed mild improvement in mitral regurg but EF still 10%.  . Paronychia of right thumb 08/22/2012  . Gallstones 06/2014    No evidence of cholecystitis on abd u/s 06/2014  . Chronic constipation   . GERD (gastroesophageal reflux disease)   . Chronic rhinitis     ?vasomotor (Dr. wErik Obey  . Bilateral sensorineural hearing loss      Family History  Problem Relation Age of Onset  . Heart disease Mother   . Breast cancer Maternal Grandmother   . Coronary artery disease Father     family hx of  . Heart attack Father     4 stents/ pacemaker  . Hypertension Sister   . Goiter Paternal Grandmother   . Nephrolithiasis Son   . Other Mother  colon surgery for fistula  . Nephrolithiasis Father      Social History   Social History  . Marital Status: Married    Spouse Name: N/A  . Number of Children: 3  . Years of Education: N/A   Occupational History  . Retired    Social History Main Topics  . Smoking status: Former Smoker -- 2.00 packs/day for 15 years    Types: Cigarettes    Quit date: 01/15/1980  . Smokeless tobacco: Never Used  . Alcohol Use: 0.0 oz/week    0 Standard drinks or equivalent per week     Comment: couple glasses of wine a week  . Drug Use: No  . Sexual Activity: Not on file   Other Topics Concern  . Not on file   Social History Narrative   Married.   Hx of cigarettes: quit in the 1980s.  No alc/drugs.     Allergies  Allergen Reactions  . Sulfa Antibiotics Rash     Outpatient  Prescriptions Prior to Visit  Medication Sig Dispense Refill  . albuterol (VENTOLIN HFA) 108 (90 Base) MCG/ACT inhaler Inhale 1-2 puffs into the lungs every 4 (four) hours as needed for wheezing or shortness of breath. 1 Inhaler 2  . amiodarone (PACERONE) 200 MG tablet Take 1 tablet (200 mg total) by mouth daily. 90 tablet 3  . carvedilol (COREG) 12.5 MG tablet TAKE 1 TABLET BY MOUTH TWICE DAILY WITH A MEAL 180 tablet 1  . cholecalciferol (VITAMIN D) 1000 units tablet Take 1,000 Units by mouth daily.    [name] eplerenone (INSPRA) 25 MG tablet Take 1 tablet (25 mg total) by mouth daily. 90 tablet 3  . fluticasone (FLONASE) 50 MCG/ACT nasal spray INSTILL 2 SPRAYS INTO EACH NOSTRIL TWICE DAILY 96 g 3  . furosemide (LASIX) 40 MG tablet Take 2 tablets (80 mg total) by mouth daily. 180 tablet 3  . ipratropium (ATROVENT) 0.06 % nasal spray Place 2 sprays into the nose 4 (four) times daily. 15 mL 6  . levocetirizine (XYZAL) 5 MG tablet TAKE 1 TABLET BY MOUTH EVERY MORNING 30 tablet 11  . Magnesium 250 MG TABS Take 1 tablet by mouth daily.    [name] mexiletine (MEXITIL) 150 MG capsule Take 2 capsules (300 mg total) by mouth 2 (two) times daily. 360 capsule 3  . Multiple Vitamin (MULTIVITAMIN WITH MINERALS) TABS Take 1 tablet by mouth daily.    . nitroGLYCERIN (NITROSTAT) 0.4 MG SL tablet Place 1 tablet (0.4 mg total) under the tongue every 5 (five) minutes as needed for chest pain. 25 tablet 3  . polyvinyl alcohol-povidone (REFRESH) 1.4-0.6 % ophthalmic solution Place 1-2 drops into both eyes as needed (dry eyes).     . potassium chloride SA (K-DUR,KLOR-CON) 20 MEQ tablet TAKE 1 TABLET BY MOUTH EVERY DAY 90 tablet 1  . Psyllium (METAMUCIL PO) Take 3 teaspoons by mouth 3 times daily    . valsartan (DIOVAN) 40 MG tablet Take 1 tablet (40 mg total) by mouth daily. 90 tablet 0  . XARELTO 20 MG TABS tablet TAKE 1 TABLET(20 MG) BY MOUTH DAILY WITH DINNER 30 tablet 11   No facility-administered medications prior to  visit.         Objective:   Physical Exam Filed Vitals:   03/28/15 1522 03/28/15 1523  BP:  108/68  Pulse:  64  Height: _0  (1.778 m)   Weight: 233 lb (105.688 kg)   SpO2:  96%   Gen: Pleasant, well-nourished, in  no distress,  normal affect  ENT: No lesions,  mouth clear,  oropharynx clear, no postnasal drip  Neck: No JVD, no TMG, no carotid bruits  Lungs: No use of accessory muscles, clear without rales or rhonchi  Cardiovascular: RRR, heart sounds normal, no murmur or gallops, no peripheral edema  Musculoskeletal: No deformities, no cyanosis or clubbing  Neuro: alert, non focal  Skin: Warm, no lesions or rashes      Assessment & Plan:  Obstructive sleep apnea Currently untreated unfortunately. He has not been able to tolerate CPAP. It has been causing significant dehydration dry mucous membranes. Not in position to restart at this time.  COPD (chronic obstructive pulmonary disease) ([place]) His prior spirometry was consistent with some mild obstruction. There were no lung volumes done at that time. Currently his bladder function testing is consistent with mixed disease confirming his probable COPD. His symptoms are consistent and he has benefited from albuterol. For this reason I will like to start him on scheduled bronchodilators to see how he responds. We will start Symbicort twice a day and follow him and approximate one month.  Restrictive lung disease Lung volumes confirm restriction with a decreased diffusion capacity that corrects to the normal range when adjusted for his alveolar volume. I suspect that obesity is at least part of the examination for this. Given his amiodarone exposure we do need to consider amiodarone toxicity and interstitial lung disease. He had a CT scan of his abdomen last year that suggested some interstitial prominence. I would like to perform a high-resolution CT scan of the chest to assess in more detail.

## 2015-03-28 NOTE — Assessment & Plan Note (Signed)
Currently untreated unfortunately. He has not been able to tolerate CPAP. It has been causing significant dehydration dry mucous membranes. Not in position to restart at this time.

## 2015-03-28 NOTE — Assessment & Plan Note (Signed)
His prior spirometry was consistent with some mild obstruction. There were no lung volumes done at that time. Currently his bladder function testing is consistent with mixed disease confirming his probable COPD. His symptoms are consistent and he has benefited from albuterol. For this reason I will like to start him on scheduled bronchodilators to see how he responds. We will start Symbicort twice a day and follow him and approximate one month.

## 2015-03-28 NOTE — Patient Instructions (Addendum)
Perform a high-resolution CT scan of your chest Please start Symbicort 160/4.5 g, 2 puffs twice a day. Please rinse mouth and gargle after using this medication Continue to have albuterol available to use 2 puffs if needed for shortness of breath or coughing Continue ipratropium nasal spray as you have been taking it Do not restart fluticasone nasal spray at this time Follow with Dr Lamonte Sakai in 1 month to review your response to the medication and CT scan.

## 2015-03-28 NOTE — Assessment & Plan Note (Signed)
Lung volumes confirm restriction with a decreased diffusion capacity that corrects to the normal range when adjusted for his alveolar volume. I suspect that obesity is at least part of the examination for this. Given his amiodarone exposure we do need to consider amiodarone toxicity and interstitial lung disease. He had a CT scan of his abdomen last year that suggested some interstitial prominence. I would like to perform a high-resolution CT scan of the chest to assess in more detail.

## 2015-03-29 ENCOUNTER — Telehealth: Payer: Self-pay | Admitting: Family Medicine

## 2015-03-29 ENCOUNTER — Ambulatory Visit (INDEPENDENT_AMBULATORY_CARE_PROVIDER_SITE_OTHER): Payer: Medicare Other | Admitting: Family Medicine

## 2015-03-29 ENCOUNTER — Encounter: Payer: Self-pay | Admitting: Family Medicine

## 2015-03-29 VITALS — BP 124/82 | HR 60 | Temp 97.6°F | Resp 20 | Wt 232.0 lb

## 2015-03-29 DIAGNOSIS — K625 Hemorrhage of anus and rectum: Secondary | ICD-10-CM

## 2015-03-29 DIAGNOSIS — K648 Other hemorrhoids: Secondary | ICD-10-CM | POA: Insufficient documentation

## 2015-03-29 DIAGNOSIS — K921 Melena: Secondary | ICD-10-CM

## 2015-03-29 DIAGNOSIS — I255 Ischemic cardiomyopathy: Secondary | ICD-10-CM

## 2015-03-29 LAB — CBC WITH DIFFERENTIAL/PLATELET
BASOS PCT: 0.4 % (ref 0.0–3.0)
Basophils Absolute: 0 10*3/uL (ref 0.0–0.1)
EOS PCT: 8 % — AB (ref 0.0–5.0)
Eosinophils Absolute: 0.7 10*3/uL (ref 0.0–0.7)
HCT: 44.3 % (ref 39.0–52.0)
Hemoglobin: 15 g/dL (ref 13.0–17.0)
LYMPHS ABS: 2.1 10*3/uL (ref 0.7–4.0)
LYMPHS PCT: 23.6 % (ref 12.0–46.0)
MCHC: 33.9 g/dL (ref 30.0–36.0)
MCV: 102.3 fl — AB (ref 78.0–100.0)
MONOS PCT: 8.1 % (ref 3.0–12.0)
Monocytes Absolute: 0.7 10*3/uL (ref 0.1–1.0)
NEUTROS ABS: 5.4 10*3/uL (ref 1.4–7.7)
NEUTROS PCT: 59.9 % (ref 43.0–77.0)
Platelets: 325 10*3/uL (ref 150.0–400.0)
RBC: 4.33 Mil/uL (ref 4.22–5.81)
RDW: 14 % (ref 11.5–15.5)
WBC: 9 10*3/uL (ref 4.0–10.5)

## 2015-03-29 LAB — POC HEMOCCULT BLD/STL (OFFICE/1-CARD/DIAGNOSTIC): FECAL OCCULT BLD: POSITIVE — AB

## 2015-03-29 MED ORDER — HYDROCORTISONE ACETATE 25 MG RE SUPP: 25.0000 mg | Freq: Two times a day (BID) | RECTAL | Status: DC

## 2015-03-29 NOTE — Telephone Encounter (Signed)
Patient decided to see Dr. Raoul Pitch today since she has openings.

## 2015-03-29 NOTE — Progress Notes (Signed)
Patient ID: [name], male   DOB: 1940/11/12, 75 y.o.   MRN: [id number]    [name] , 11-16-1940, 75 y.o., male MRN: [id number]  CC: rectal bleeding Subjective: Pt presents for an acute OV with complaints of rectal bleeding of 1 day duration. Associated symptoms include nothing. Patient denies fatigue, fever, chills, shortness of breath, chest pain, abdominal pain, rectal pain or dizziness. Patient reports that he was having a bowel movement this morning, and noticed a small amount of blood on his toilet paper. He denies any blood within the stool or in the toilet itself. Patient denies any prior rectal bleeding. He denies any pain associated with a bowel movement. Patient does admit to initially straining with a bowel movement, and then after evacuation of a small hard stool, he was able to have a normal bowel movement. Since that event he has had an additional bowel movement that was normal and no blood was identified on his toilet paper. Patient has concerns for "bleeding out "because he is on Xarelto. Patient is on 20 mg a Xarelto for atrial fibrillation, with a GFR of 80, and appropriate use of Xarelto with nighttime meal. Patient denies any NSAID use. Patient has history of internal and external hemorrhoids, as well as diverticulosis by colonoscopy report in 2014. He had essentially normal EGD in 2014.   Allergies  Allergen Reactions  . Sulfa Antibiotics Rash   Social History  Substance Use Topics  . Smoking status: Former Smoker -- 2.00 packs/day for 15 years    Types: Cigarettes    Quit date: 01/15/1980  . Smokeless tobacco: Never Used  . Alcohol Use: 0.0 oz/week    0 Standard drinks or equivalent per week     Comment: couple glasses of wine a week   Past Medical History  Diagnosis Date  . Atrial fibrillation ([place])     Ablation for a-flutter 05/2000  . Cardiomyopathy     Ischemic.  EF 20%; pt has CRT-Device  . Hypertension   . Hyperlipidemia   . Allergic rhinitis   .  Obstructive sleep apnea     CPAP 16  . CAD (coronary artery disease)     PTCA RCA 1997 (Inf/post MI);  [name] Ventricular tachycardia ([place])     ICD, pacer  . Adenomatous colon polyp 2009; 06/2012    Colonoscopy 07/2007---Dr. Barron Sch[facility]--Repeat 06/2012 showed one tubular adenoma w/out high grade dysplasia.  . Nephrolithiasis     No distal stones on CT 2010; stable 8 mm RLP stone, 33m LMP stone 10/2013 plain film.  Abd u/s 06/2014: 9 mm nonobstructing lower pole right renal stone.  03/2015 urol: stable nonobstructing renal calc by CT 12/2014 and KUB 03/2015.  .[place] KitchenMyocardial infarction ([place]   . Microscopic hematuria     Cysto neg 02/2009 except for BPH-likely has microhem from his renal stone dz  . Diverticulosis of colon 2005    Noted on Colonoscopies in 2005 and 2009 and on CT 2010  . Internal hemorrhoids     Colonoscopy 07/2007  . Hepatic steatosis 2010; 2016    Noted on CT abd 2010 and on u/s abd 04/2009 (transaminasemia) and u/s 06/2014.  .Marland KitchenBPH (benign prostatic hypertrophy)     PSAs ok (followed by urologist): no LUTS  . History of impaired glucose tolerance   . Shortness of breath   . CHF (congestive heart failure) ([place]   . Pacemaker     pacermaker /ICD/CRT---LV lead replacement 12/2012.  F/u echo showed  mild improvement in mitral regurg but EF still 10%.  . Paronychia of right thumb 08/22/2012  . Gallstones 06/2014    No evidence of cholecystitis on abd u/s 06/2014  . Chronic constipation   . GERD (gastroesophageal reflux disease)   . Chronic rhinitis     ?vasomotor (Dr. Erik Obey)  . Bilateral sensorineural hearing loss    Past Surgical History  Procedure Laterality Date  . Icd      a) Guidant Contak H170- Virl Axe, MD 07/25/06 (second device) b) Medtronic CRT-D  . Breast surgery Left 1990    "Tissue removed from breast"  . Inguinal hernia repair Bilateral 1975  . Extracorporeal shock wave lithotripsy    . Atrial ablation surgery    . Cardiac catheterization    .  Esophagogastroduodenoscopy N/A 07/13/2012    Procedure: ESOPHAGOGASTRODUODENOSCOPY (EGD);  Surgeon: Jeryl Columbia, MD;  Location: [facility];  Service: Endoscopy;  Laterality: N/A;  NORMAL  . Colonoscopy N/A 07/13/2012    Diverticula ascending colon.  Polyp x 1-recall 5 yrs (Dr. Watt Climes) Procedure: COLONOSCOPY;  Surgeon: Jeryl Columbia, MD;  Location: [facility];  Service: Endoscopy;  Laterality: N/A; polypectomy x 1   . Carotid doppler  09/25/12    No signif stenosis  . Lead revision Bilateral 12/26/2011    Procedure: LEAD REVISION;  Surgeon: Evans Lance, MD;  [facility]: [facility] CATH LAB;  Service: Cardiovascular;  Laterality: Bilateral;  . Transthoracic echocardiogram  01/2013    EF 20%, multiple areas of LV akinesis, no signif valvular dz, grade 1 diast dysfxn  . Pfts  02/2015    Mod restriction (interstitial), mod diffusion defect (Dr. Annamaria Boots)  . Pacemaker placement      with defibulator   Family History  Problem Relation Age of Onset  . Heart disease Mother   . Breast cancer Maternal Grandmother   . Coronary artery disease Father     family hx of  . Heart attack Father     4 stents/ pacemaker  . Hypertension Sister   . Goiter Paternal Grandmother   . Nephrolithiasis Son   . Other Mother     colon surgery for fistula  . Nephrolithiasis Father      Medication List       This list is accurate as of: 03/29/15 10:48 AM.  Always use your most recent med list.               albuterol 108 (90 Base) MCG/ACT inhaler  Commonly known as:  VENTOLIN HFA  Inhale 1-2 puffs into the lungs every 4 (four) hours as needed for wheezing or shortness of breath.     amiodarone 200 MG tablet  Commonly known as:  PACERONE  Take 1 tablet (200 mg total) by mouth daily.     carvedilol 12.5 MG tablet  Commonly known as:  COREG  TAKE 1 TABLET BY MOUTH TWICE DAILY WITH A MEAL     cholecalciferol 1000 units tablet  Commonly known as:  VITAMIN D  Take 1,000 Units by mouth daily.     eplerenone 25 MG  tablet  Commonly known as:  INSPRA  Take 1 tablet (25 mg total) by mouth daily.     fluticasone 50 MCG/ACT nasal spray  Commonly known as:  FLONASE  INSTILL 2 SPRAYS INTO EACH NOSTRIL TWICE DAILY     furosemide 40 MG tablet  Commonly known as:  LASIX  Take 2 tablets (80 mg total) by mouth daily.     ipratropium  0.06 % nasal spray  Commonly known as:  ATROVENT  Place 2 sprays into the nose 4 (four) times daily.     levocetirizine 5 MG tablet  Commonly known as:  XYZAL  TAKE 1 TABLET BY MOUTH EVERY MORNING     Magnesium 250 MG Tabs  Take 1 tablet by mouth daily.     METAMUCIL PO  Take 3 teaspoons by mouth 3 times daily     mexiletine 150 MG capsule  Commonly known as:  MEXITIL  Take 2 capsules (300 mg total) by mouth 2 (two) times daily.     multivitamin with minerals Tabs tablet  Take 1 tablet by mouth daily.     nitroGLYCERIN 0.4 MG SL tablet  Commonly known as:  NITROSTAT  Place 1 tablet (0.4 mg total) under the tongue every 5 (five) minutes as needed for chest pain.     omeprazole 20 MG capsule  Commonly known as:  PRILOSEC  Take 20 mg by mouth daily.     polyethylene glycol packet  Commonly known as:  MIRALAX / GLYCOLAX  Take 17 g by mouth daily.     potassium chloride SA 20 MEQ tablet  Commonly known as:  K-DUR,KLOR-CON  TAKE 1 TABLET BY MOUTH EVERY DAY     REFRESH 1.4-0.6 % ophthalmic solution  Generic drug:  polyvinyl alcohol-povidone  Place 1-2 drops into both eyes as needed (dry eyes).     valsartan 40 MG tablet  Commonly known as:  DIOVAN  Take 1 tablet (40 mg total) by mouth daily.     XARELTO 20 MG Tabs tablet  Generic drug:  rivaroxaban  TAKE 1 TABLET(20 MG) BY MOUTH DAILY WITH DINNER       ROS: Negative, with the exception of above mentioned in HPI  Objective:  BP 124/82 mmHg  Pulse 60  Temp(Src) 97.6 F (36.4 C)  Resp 20  Wt 232 lb (105.235 kg)  SpO2 95% Body mass index is 33.29 kg/(m^2). Gen: Afebrile. No acute distress.  Nontoxic in appearance, well-developed, well-nourished, Caucasian male. Pleasant. HENT: AT. [place]. MMM, no oral lesions.  Eyes:Pupils Equal Round Reactive to light, Extraocular movements intact,  Conjunctiva without redness, discharge or icterus. CV: RRR Abd: Soft. Obese. NTND. BS present. No Masses palpated. No rebound or guarding. Neuro: Normal gait. PERLA. EOMi. Alert. Oriented x3  Rectal exam: No anal fissure, no tenderness noted,  internal hemorrhoids noted, sphincter tone normal, no obvious blood on exam, stool guaiac positive.  Assessment/Plan: DUSTAN HYAMS is a 75 y.o. male present for acute OV for  Blood in stool/rectal bleeding/internal hemorrhoid - POC Hemoccult Bld/Stl (1-Cd Office Dx)--> POSITIVE - CBC w/Diff - No NSAIDS - Monitor stool, needs to be soft, not diarrhea or constipation, Miralax use.  - hydrocortisone (ANUSOL-HC) 25 MG suppository; Place 1 suppository (25 mg total) rectally 2 (two) times daily.  Dispense: 12 suppository; Refill: 0 - Pt on xarelto, taking appropriately. If CBC normal, will continue to watch/wait with anusol  Treatment.  - pt to monitor for continued or worsening symptoms. Discussed alarm symptoms with pt and  to be seen immediately if occur and provided AVS info.  - Consider GI referral if worsening or not improved. - Follow-up depending upon CBC  electronically signed by:  Howard Pouch, DO  [place]

## 2015-03-29 NOTE — Telephone Encounter (Signed)
Patient found blood in his stool this morning. I scheduled him in your first opening which is tomorrow morning at 9:30. Patient is concerned, he wants to be seen today. He wanted me to check with you to see if it is okay for him to wait until tomorrow to be seen.

## 2015-03-29 NOTE — Patient Instructions (Signed)
suppository called in to use 1 suppository two times daily for 7-14 days. We will call you with results of labs.  If you rectal bleeding increases or does not stop within 7 days, please make an appt.  If you have any dizziness, increased fatigue, fever or abdominal pain, please be seen imeedaitely. If you do not respond to treatment, we will need you to see you GI doctor (Dr. Watt Climes). You need to keep stool soft, but not watery.  No NSAIDS (advil, aleve, motrin, naproxen etc)  Hemorrhoids Hemorrhoids are swollen veins around the rectum or anus. There are two types of hemorrhoids:   Internal hemorrhoids. These occur in the veins just inside the rectum. They may poke through to the outside and become irritated and painful.  External hemorrhoids. These occur in the veins outside the anus and can be felt as a painful swelling or hard lump near the anus. CAUSES  Pregnancy.   Obesity.   Constipation or diarrhea.   Straining to have a bowel movement.   Sitting for long periods on the toilet.  Heavy lifting or other activity that caused you to strain.  Anal intercourse. SYMPTOMS   Pain.   Anal itching or irritation.   Rectal bleeding.   Fecal leakage.   Anal swelling.   One or more lumps around the anus.  DIAGNOSIS  Your caregiver may be able to diagnose hemorrhoids by visual examination. Other examinations or tests that may be performed include:   Examination of the rectal area with a gloved hand (digital rectal exam).   Examination of anal canal using a small tube (scope).   A blood test if you have lost a significant amount of blood.  A test to look inside the colon (sigmoidoscopy or colonoscopy). TREATMENT Most hemorrhoids can be treated at home. However, if symptoms do not seem to be getting better or if you have a lot of rectal bleeding, your caregiver may perform a procedure to help make the hemorrhoids get smaller or remove them completely. Possible  treatments include:   Placing a rubber band at the base of the hemorrhoid to cut off the circulation (rubber band ligation).   Injecting a chemical to shrink the hemorrhoid (sclerotherapy).   Using a tool to burn the hemorrhoid (infrared light therapy).   Surgically removing the hemorrhoid (hemorrhoidectomy).   Stapling the hemorrhoid to block blood flow to the tissue (hemorrhoid stapling).  HOME CARE INSTRUCTIONS   Eat foods with fiber, such as whole grains, beans, nuts, fruits, and vegetables. Ask your doctor about taking products with added fiber in them (fibersupplements).  Increase fluid intake. Drink enough water and fluids to keep your urine clear or pale yellow.   Exercise regularly.   Go to the bathroom when you have the urge to have a bowel movement. Do not wait.   Avoid straining to have bowel movements.   Keep the anal area dry and clean. Use wet toilet paper or moist towelettes after a bowel movement.   Medicated creams and suppositories may be used or applied as directed.   Only take over-the-counter or prescription medicines as directed by your caregiver.   Take warm sitz baths for 15-20 minutes, 3-4 times a day to ease pain and discomfort.   Place ice packs on the hemorrhoids if they are tender and swollen. Using ice packs between sitz baths may be helpful.   Put ice in a plastic bag.   Place a towel between your skin and the bag.  Leave the ice on for 15-20 minutes, 3-4 times a day.   Do not use a donut-shaped pillow or sit on the toilet for long periods. This increases blood pooling and pain.  SEEK MEDICAL CARE IF:  You have increasing pain and swelling that is not controlled by treatment or medicine.  You have uncontrolled bleeding.  You have difficulty or you are unable to have a bowel movement.  You have pain or inflammation outside the area of the hemorrhoids. MAKE SURE YOU:  Understand these instructions.  Will watch your  condition.  Will get help right away if you are not doing well or get worse.   This information is not intended to replace advice given to you by your health care provider. Make sure you discuss any questions you have with your health care provider.   Document Released: 12/29/1999 Document Revised: 12/18/2011 Document Reviewed: 11/05/2011 Elsevier Interactive Patient Education [employer].

## 2015-03-29 NOTE — Telephone Encounter (Signed)
Please call pt: - his blood work is stable from prior collections. No signs of blood loss.  - Use suppositories as dicussed to treat hemorrhoid. If no improvement in 2 weeks he is to follow up with PCP.  If he is having increase in bleeding he needs to be seen immediately.

## 2015-03-30 ENCOUNTER — Ambulatory Visit: Payer: Medicare Other | Admitting: Family Medicine

## 2015-03-30 NOTE — Telephone Encounter (Signed)
Spoke with patient reviewed lab results and instructions. Patient verbalized understanding.

## 2015-04-04 ENCOUNTER — Ambulatory Visit (HOSPITAL_BASED_OUTPATIENT_CLINIC_OR_DEPARTMENT_OTHER)
Admission: RE | Admit: 2015-04-04 | Discharge: 2015-04-04 | Disposition: A | Discharge status: Home / Self Care | Payer: Medicare Other | Source: Ambulatory Visit | Attending: Emergency Medicine | Admitting: Emergency Medicine

## 2015-04-04 ENCOUNTER — Other Ambulatory Visit: Payer: Self-pay | Admitting: Cardiology

## 2015-04-04 DIAGNOSIS — J849 Interstitial pulmonary disease, unspecified: Secondary | ICD-10-CM | POA: Diagnosis not present

## 2015-04-04 DIAGNOSIS — R918 Other nonspecific abnormal finding of lung field: Secondary | ICD-10-CM | POA: Diagnosis not present

## 2015-04-04 DIAGNOSIS — I2510 Atherosclerotic heart disease of native coronary artery without angina pectoris: Secondary | ICD-10-CM | POA: Insufficient documentation

## 2015-04-04 DIAGNOSIS — J439 Emphysema, unspecified: Secondary | ICD-10-CM | POA: Insufficient documentation

## 2015-04-04 DIAGNOSIS — J479 Bronchiectasis, uncomplicated: Secondary | ICD-10-CM | POA: Insufficient documentation

## 2015-04-04 DIAGNOSIS — K8020 Calculus of gallbladder without cholecystitis without obstruction: Secondary | ICD-10-CM | POA: Diagnosis not present

## 2015-04-04 NOTE — Telephone Encounter (Signed)
Rx(s) sent to pharmacy electronically.  

## 2015-04-12 ENCOUNTER — Ambulatory Visit (INDEPENDENT_AMBULATORY_CARE_PROVIDER_SITE_OTHER): Payer: Medicare Other | Admitting: *Deleted

## 2015-04-12 ENCOUNTER — Encounter: Payer: Self-pay | Admitting: Internal Medicine

## 2015-04-12 DIAGNOSIS — I255 Ischemic cardiomyopathy: Secondary | ICD-10-CM | POA: Diagnosis not present

## 2015-04-12 DIAGNOSIS — Z95810 Presence of automatic (implantable) cardiac defibrillator: Secondary | ICD-10-CM | POA: Diagnosis not present

## 2015-04-12 DIAGNOSIS — I472 Ventricular tachycardia: Secondary | ICD-10-CM

## 2015-04-12 DIAGNOSIS — I4729 Other ventricular tachycardia: Secondary | ICD-10-CM

## 2015-04-12 NOTE — Progress Notes (Signed)
Remote ICD transmission.   

## 2015-04-21 ENCOUNTER — Telehealth: Payer: Self-pay | Admitting: Emergency Medicine

## 2015-04-21 MED ORDER — BUDESONIDE-FORMOTEROL FUMARATE 160-4.5 MCG/ACT IN AERO: 2.0000 | INHALATION_SPRAY | Freq: Two times a day (BID) | RESPIRATORY_TRACT | Status: DC

## 2015-04-21 NOTE — Telephone Encounter (Signed)
Pt was seen by RB on 03/28/15 with the following instructions:  Patient Instructions     Perform a high-resolution CT scan of your chest Please start Symbicort 160/4.5 g, 2 puffs twice a day. Please rinse mouth and gargle after using this medication Continue to have albuterol available to use 2 puffs if needed for shortness of breath or coughing Continue ipratropium nasal spray as you have been taking it Do not restart fluticasone nasal spray at this time Follow with Dr Lamonte Sakai in 1 month to review your response to the medication and CT scan.   ------  Pt is requesting rx for Symbicort to go to [facility].  Rx sent.  Pt aware.  Dr. Lamonte Sakai, pt is requesting CT Chest results - please advise.  Thank you.

## 2015-04-21 NOTE — Telephone Encounter (Signed)
See previous phone msg from today - symbicort rx sent to pharm.  Pt aware.

## 2015-04-24 ENCOUNTER — Telehealth: Payer: Self-pay | Admitting: Emergency Medicine

## 2015-04-24 MED ORDER — FLUTICASONE FUROATE-VILANTEROL 100-25 MCG/INH IN AEPB: 1.0000 | INHALATION_SPRAY | Freq: Every day | RESPIRATORY_TRACT | Status: DC

## 2015-04-24 NOTE — Telephone Encounter (Signed)
Please let him know that there is some subtle evidence for mild inflammation or scar in the lungs, very little change compared with last CT scan in 2015. Also please let him know that there are a few pulmonary nodules that we will need to follow over time to insure stability. We can review in more detail at [facility]

## 2015-04-24 NOTE — Telephone Encounter (Signed)
Spoke with pt's wife. Advised her that Symbicort will not be covered with their insurance. Alternatives are Advair and Breo. Advised pt's wife that we will get this message to RB to address.  RB - please advise which alternative you prefer. Thanks.

## 2015-04-24 NOTE — Telephone Encounter (Signed)
Pt is aware of medication change. Rx has been sent in. Nothing further was needed.

## 2015-04-24 NOTE — Telephone Encounter (Signed)
Have him try Breo once a day

## 2015-04-24 NOTE — Telephone Encounter (Signed)
Pt is aware of results. Nothing further was needed.

## 2015-04-27 ENCOUNTER — Other Ambulatory Visit: Payer: Self-pay | Admitting: Cardiology

## 2015-04-27 NOTE — Telephone Encounter (Signed)
Rx(s) sent to pharmacy electronically.

## 2015-05-05 ENCOUNTER — Other Ambulatory Visit: Payer: Self-pay | Admitting: Family Medicine

## 2015-05-05 NOTE — Telephone Encounter (Signed)
Pt advised and voiced understanding.  He stated that at 4:20am this morning he woke up with really bad diarrhea, then started having the bright red rectal bleeding. I advised pt that we have sent in refill this one time but if this returns he will need to call our office to be seen. He voiced understanding.

## 2015-05-05 NOTE — Telephone Encounter (Signed)
Please call patient, I have received a refill request for the Anusol suppositories for his hemorrhage/rectal bleeding. Patient was supposed to follow-up if he had continued to have rectal bleeding. Original prescription was given 4 weeks ago. I will refill once, if he continues to have problems he will need to be reevaluated.

## 2015-05-07 NOTE — Progress Notes (Signed)
HPI: FU history of coronary artery disease, mixed ischemic and nonischemic cardiomyopathy, history of ICD, paroxysmal atrial fibrillation, and ventricular tachycardia. Previous abdominal ultrasound in April 2006 showed no aneurysm. Last cardiac catheterization in October 2008 showed an occluded right coronary artery with left-to-right collateralization. Ejection fraction is 20%. There was no other coronary disease noted. Last Myoview in Dec 2011 showed prior inferior infarct but no ischemia. His ejection fraction was 19%. ABIs in December of 2011 were normal. Previous carotid Dopplers in Sept 2014 showed 0-39% stenosis bilaterally.The patient has had upgrade of his device to CRT-D. Patient had revision of his left ventricular lead in December 2013. Echocardiogram January 2017 showed ejection fraction 29-92%, grade 1 diastolic dysfunction, mild mitral regurgitation, severe left atrial enlargement mild right sided enlargement and mild tricuspid regurgitation. Chest CT March 2017 showed interstitial lung disease. Nodule noted in right lower ended in 3 months. Dr Lamonte Sakai felt not significantly changed since 2015. Since last seen, He has some fatigue. No significant dyspnea. No chest pain or syncope.  Current Outpatient Prescriptions  Medication Sig Dispense Refill  . albuterol (VENTOLIN HFA) 108 (90 Base) MCG/ACT inhaler Inhale 1-2 puffs into the lungs every 4 (four) hours as needed for wheezing or shortness of breath. 1 Inhaler 2  . amiodarone (PACERONE) 200 MG tablet Take 1 tablet (200 mg total) by mouth daily. 90 tablet 3  . ANUCORT-HC 25 MG suppository PLACE 1 SUPPOSITORY RECTALLY TWICE DAILY 12 suppository 0  . carvedilol (COREG) 12.5 MG tablet TAKE 1 TABLET BY MOUTH TWICE DAILY WITH A MEAL 180 tablet 1  . cholecalciferol (VITAMIN D) 1000 units tablet Take 1,000 Units by mouth daily.    [name] eplerenone (INSPRA) 25 MG tablet Take 1 tablet (25 mg total) by mouth daily. 90 tablet 3  . fluticasone  furoate-vilanterol (BREO ELLIPTA) 100-25 MCG/INH AEPB Inhale 1 puff into the lungs daily. 60 each 5  . furosemide (LASIX) 40 MG tablet Take 2 tablets (80 mg total) by mouth daily. 180 tablet 3  . ipratropium (ATROVENT) 0.06 % nasal spray Place 2 sprays into the nose 4 (four) times daily. 15 mL 6  . levocetirizine (XYZAL) 5 MG tablet TAKE 1 TABLET BY MOUTH EVERY MORNING 30 tablet 11  . Magnesium 250 MG TABS Take 1 tablet by mouth daily.    [name] mexiletine (MEXITIL) 150 MG capsule Take 2 capsules (300 mg total) by mouth 2 (two) times daily. 360 capsule 3  . Multiple Vitamin (MULTIVITAMIN WITH MINERALS) TABS Take 1 tablet by mouth daily.    . nitroGLYCERIN (NITROSTAT) 0.4 MG SL tablet Place 1 tablet (0.4 mg total) under the tongue every 5 (five) minutes as needed for chest pain. 25 tablet 3  . omeprazole (PRILOSEC) 20 MG capsule Take 20 mg by mouth daily.    . polyethylene glycol (MIRALAX / GLYCOLAX) packet Take 17 g by mouth daily.    . polyvinyl alcohol-povidone (REFRESH) 1.4-0.6 % ophthalmic solution Place 1-2 drops into both eyes as needed (dry eyes).     . potassium chloride SA (K-DUR,KLOR-CON) 20 MEQ tablet TAKE 1 TABLET BY MOUTH EVERY DAY 90 tablet 1  . Probiotic Product (PROBIOTIC DAILY PO) Take 1 tablet by mouth daily.    . Psyllium (METAMUCIL PO) Take 3 teaspoons by mouth 3 times daily    . valsartan (DIOVAN) 40 MG tablet TAKE 1 TABLET(40 MG) BY MOUTH DAILY 90 tablet 2  . XARELTO 20 MG TABS tablet TAKE 1 TABLET(20 MG) BY MOUTH DAILY  WITH DINNER 30 tablet 11   No current facility-administered medications for this visit.     Past Medical History  Diagnosis Date  . Atrial fibrillation ([place])     Ablation for a-flutter 05/2000  . Cardiomyopathy     Ischemic.  EF 20%; pt has CRT-Device  . Hypertension   . Hyperlipidemia   . Allergic rhinitis   . Obstructive sleep apnea     CPAP 16  . CAD (coronary artery disease)     PTCA RCA 1997 (Inf/post MI);  [name] Ventricular tachycardia ([place])      ICD, pacer  . Adenomatous colon polyp 2009; 06/2012    Colonoscopy 07/2007---Dr. Barron Schmid GI--Repeat 06/2012 showed one tubular adenoma w/out high grade dysplasia.  . Nephrolithiasis     No distal stones on CT 2010; stable 8 mm RLP stone, 7m LMP stone 10/2013 plain film.  Abd u/s 06/2014: 9 mm nonobstructing lower pole right renal stone.  03/2015 urol: stable nonobstructing renal calc by CT 12/2014 and KUB 03/2015.  .Marland KitchenMyocardial infarction ([place]   . Microscopic hematuria     Cysto neg 02/2009 except for BPH-likely has microhem from his renal stone dz  . Diverticulosis of colon 2005    Noted on Colonoscopies in 2005 and 2009 and on CT 2010  . Internal hemorrhoids     Colonoscopy 07/2007  . Hepatic steatosis 2010; 2016    Noted on CT abd 2010 and on u/s abd 04/2009 (transaminasemia) and u/s 06/2014.  .Marland KitchenBPH (benign prostatic hypertrophy)     PSAs ok (followed by urologist): no LUTS  . History of impaired glucose tolerance   . Shortness of breath   . CHF (congestive heart failure) ([place]   . Pacemaker     pacermaker /ICD/CRT---LV lead replacement 12/2012.  F/u echo showed mild improvement in mitral regurg but EF still 10%.  . Paronychia of right thumb 08/22/2012  . Gallstones 06/2014    No evidence of cholecystitis on abd u/s 06/2014  . Chronic constipation   . GERD (gastroesophageal reflux disease)   . Chronic rhinitis     ?vasomotor (Dr. wErik Obey  . Bilateral sensorineural hearing loss     Past Surgical History  Procedure Laterality Date  . Icd      a) Guidant Contak H170- SVirl Axe MD 07/25/06 (second device) b) Medtronic CRT-D  . Breast surgery Left 1990    "Tissue removed from breast"  . Inguinal hernia repair Bilateral 1975  . Extracorporeal shock wave lithotripsy    . Atrial ablation surgery    . Cardiac catheterization    . Esophagogastroduodenoscopy N/A 07/13/2012    Procedure: ESOPHAGOGASTRODUODENOSCOPY (EGD);  Surgeon: MJeryl Columbia MD;  [facility];  Service:  Endoscopy;  Laterality: N/A;  NORMAL  . Colonoscopy N/A 07/13/2012    Diverticula ascending colon.  Polyp x 1-recall 5 yrs (Dr. MWatt Climes Procedure: COLONOSCOPY;  Surgeon: MJeryl Columbia MD;  Location: [facility];  Service: Endoscopy;  Laterality: N/A; polypectomy x 1   . Carotid doppler  09/25/12    No signif stenosis  . Lead revision Bilateral 12/26/2011    Procedure: LEAD REVISION;  Surgeon: GEvans Lance MD;  [facility];  Service: Cardiovascular;  Laterality: Bilateral;  . Transthoracic echocardiogram  01/2013    EF 20%, multiple areas of LV akinesis, no signif valvular dz, grade 1 diast dysfxn  . Pfts  02/2015    Mod restriction (interstitial), mod diffusion defect (Dr. YAnnamaria Boots  . Pacemaker placement  with defibulator    Social History   Social History  . Marital Status: Married    Spouse Name: N/A  . Number of Children: 3  . Years of Education: N/A   Occupational History  . Retired    Social History Main Topics  . Smoking status: Former Smoker -- 2.00 packs/day for 15 years    Types: Cigarettes    Quit date: 01/15/1980  . Smokeless tobacco: Never Used  . Alcohol Use: 0.0 oz/week    0 Standard drinks or equivalent per week     Comment: couple glasses of wine a week  . Drug Use: No  . Sexual Activity: Not on file   Other Topics Concern  . Not on file   Social History Narrative   Married.   Hx of cigarettes: quit in the 1980s.  No alc/drugs.    Family History  Problem Relation Age of Onset  . Heart disease Mother   . Breast cancer Maternal Grandmother   . Coronary artery disease Father     family hx of  . Heart attack Father     4 stents/ pacemaker  . Hypertension Sister   . Goiter Paternal Grandmother   . Nephrolithiasis Son   . Other Mother     colon surgery for fistula  . Nephrolithiasis Father     ROS: no fevers or chills, productive cough, hemoptysis, dysphasia, odynophagia, melena, hematochezia, dysuria, hematuria, rash, seizure  activity, orthopnea, PND, pedal edema, claudication. Remaining systems are negative.  Physical Exam: Well-developed well-nourished in no acute distress.  Skin is warm and dry.  HEENT is normal.  Neck is supple.  Chest is clear to auscultation with normal expansion.  Cardiovascular exam is regular rate and rhythm.  Abdominal exam nontender or distended. No masses palpated. Extremities show no edema. neuro grossly intact

## 2015-05-11 ENCOUNTER — Encounter: Payer: Self-pay | Admitting: Cardiology

## 2015-05-11 ENCOUNTER — Ambulatory Visit (INDEPENDENT_AMBULATORY_CARE_PROVIDER_SITE_OTHER): Payer: Medicare Other | Admitting: Cardiology

## 2015-05-11 ENCOUNTER — Telehealth: Payer: Self-pay | Admitting: Cardiology

## 2015-05-11 VITALS — BP 108/70 | HR 60 | Ht 70.0 in | Wt 229.0 lb

## 2015-05-11 DIAGNOSIS — I472 Ventricular tachycardia: Secondary | ICD-10-CM | POA: Diagnosis not present

## 2015-05-11 DIAGNOSIS — I5022 Chronic systolic (congestive) heart failure: Secondary | ICD-10-CM | POA: Diagnosis not present

## 2015-05-11 DIAGNOSIS — I255 Ischemic cardiomyopathy: Secondary | ICD-10-CM

## 2015-05-11 DIAGNOSIS — I2510 Atherosclerotic heart disease of native coronary artery without angina pectoris: Secondary | ICD-10-CM

## 2015-05-11 DIAGNOSIS — L820 Inflamed seborrheic keratosis: Secondary | ICD-10-CM | POA: Diagnosis not present

## 2015-05-11 DIAGNOSIS — D1801 Hemangioma of skin and subcutaneous tissue: Secondary | ICD-10-CM | POA: Diagnosis not present

## 2015-05-11 DIAGNOSIS — I10 Essential (primary) hypertension: Secondary | ICD-10-CM | POA: Diagnosis not present

## 2015-05-11 DIAGNOSIS — Z95810 Presence of automatic (implantable) cardiac defibrillator: Secondary | ICD-10-CM

## 2015-05-11 DIAGNOSIS — I4729 Other ventricular tachycardia: Secondary | ICD-10-CM

## 2015-05-11 DIAGNOSIS — L812 Freckles: Secondary | ICD-10-CM | POA: Diagnosis not present

## 2015-05-11 DIAGNOSIS — L570 Actinic keratosis: Secondary | ICD-10-CM | POA: Diagnosis not present

## 2015-05-11 DIAGNOSIS — I480 Paroxysmal atrial fibrillation: Secondary | ICD-10-CM

## 2015-05-11 DIAGNOSIS — L821 Other seborrheic keratosis: Secondary | ICD-10-CM | POA: Diagnosis not present

## 2015-05-11 DIAGNOSIS — I2583 Coronary atherosclerosis due to lipid rich plaque: Secondary | ICD-10-CM

## 2015-05-11 DIAGNOSIS — B351 Tinea unguium: Secondary | ICD-10-CM | POA: Diagnosis not present

## 2015-05-11 DIAGNOSIS — D692 Other nonthrombocytopenic purpura: Secondary | ICD-10-CM | POA: Diagnosis not present

## 2015-05-11 MED ORDER — SACUBITRIL-VALSARTAN 24-26 MG PO TABS: 1.0000 | ORAL_TABLET | Freq: Two times a day (BID) | ORAL | Status: DC

## 2015-05-11 NOTE — Assessment & Plan Note (Signed)
Continue diet. Not on a statin as it has caused increased liver functions previously.

## 2015-05-11 NOTE — Patient Instructions (Signed)
Medication Instructions:   STOP VALSARTAN  AFTER BEING OFF VALSARTAN FOR 36 HOURS START ENTRESTO 24/26 MG ONE TABLET TWICE DAILY  Labwork:  Your physician recommends that you return for lab work in [place]:  Your physician recommends that you schedule a follow-up appointment in: [place]

## 2015-05-11 NOTE — Assessment & Plan Note (Addendum)
Continue beta blocker. Discontinue valsartan. Begin entresto 24/26 BID; bmet one week.

## 2015-05-11 NOTE — Telephone Encounter (Signed)
Lila from [employer] is calling because there is a drug interaction between the [employer] and Eplerenone , it says that the potassium level may increase. Please call   Thanks

## 2015-05-11 NOTE — Telephone Encounter (Signed)
bmet has been scheduled for one week after starting entresto. Kirk Ruths

## 2015-05-11 NOTE — Telephone Encounter (Signed)
DEFERRED TO DR Stanford Breed AND PHARMACIST

## 2015-05-11 NOTE — Assessment & Plan Note (Signed)
Continue amiodarone. Continue xarelto.

## 2015-05-11 NOTE — Assessment & Plan Note (Signed)
Blood pressure controlled. Continue present medications. 

## 2015-05-11 NOTE — Assessment & Plan Note (Signed)
Followed by electrophysiology.

## 2015-05-11 NOTE — Assessment & Plan Note (Signed)
No aspirin given the for anticoagulation. No statin as this has caused increased liver functions previously.

## 2015-05-11 NOTE — Telephone Encounter (Signed)
Called [facility] to inform them we are aware of the interaction and will be monitoring his potassium. He has labs ordered with today's visit and previous potassium is WNL.

## 2015-05-11 NOTE — Assessment & Plan Note (Addendum)
Continue amiodarone, beta blocker and mexiletine.Check TSH and liver functions. Pulmonary is following lung scans. We will await Opinion from pulmonary concerning continuing amiodarone. There is some lung disease noted on CT scan.

## 2015-05-11 NOTE — Assessment & Plan Note (Signed)
Continue present dose of diuretics. Check potassium and renal function.

## 2015-05-15 ENCOUNTER — Telehealth: Payer: Self-pay | Admitting: Family Medicine

## 2015-05-15 HISTORY — PX: OTHER SURGICAL HISTORY: SHX169

## 2015-05-15 NOTE — Telephone Encounter (Signed)
Ok- if ok w/ Dr Anitra Lauth

## 2015-05-15 NOTE — Telephone Encounter (Signed)
OK with me.

## 2015-05-15 NOTE — Telephone Encounter (Signed)
Pt would like to trans care to Dr [name], Please advise ok to schedule.

## 2015-05-16 ENCOUNTER — Telehealth: Payer: Self-pay

## 2015-05-16 NOTE — Telephone Encounter (Signed)
Pt has been scheduled for NP appt.

## 2015-05-16 NOTE — Telephone Encounter (Signed)
Prior auth for [employer] 24-26 submitted to [facility].

## 2015-05-17 LAB — CUP PACEART REMOTE DEVICE CHECK
Brady Statistic RA Percent Paced: 100 %
Brady Statistic RV Percent Paced: 100 %
Date Time Interrogation Session: 20170503163532
HIGH POWER IMPEDANCE MEASURED VALUE: 52 Ohm
Implantable Lead Implant Date: 20020527
Implantable Lead Implant Date: 20131212
Implantable Lead Location: 753858
Implantable Lead Location: 753859
Implantable Lead Model: 148
Implantable Lead Model: 4194
Implantable Lead Model: 6940
Implantable Lead Serial Number: 120244
Lead Channel Setting Pacing Amplitude: 2.5 V
Lead Channel Setting Pacing Amplitude: 2.5 V
Lead Channel Setting Pacing Pulse Width: 0.4 ms
Lead Channel Setting Sensing Sensitivity: 1 mV
MDC IDC LEAD IMPLANT DT: 20020527
MDC IDC LEAD LOCATION: 753860
MDC IDC MSMT BATTERY REMAINING LONGEVITY: 60 mo
MDC IDC MSMT LEADCHNL LV IMPEDANCE VALUE: 772 Ohm
MDC IDC MSMT LEADCHNL RA IMPEDANCE VALUE: 562 Ohm
MDC IDC MSMT LEADCHNL RV IMPEDANCE VALUE: 745 Ohm
MDC IDC SET LEADCHNL LV PACING PULSEWIDTH: 0.7 ms
MDC IDC SET LEADCHNL RA PACING AMPLITUDE: 2 V
MDC IDC SET LEADCHNL RV SENSING SENSITIVITY: 0.5 mV
Pulse Gen Serial Number: 492371

## 2015-05-18 ENCOUNTER — Encounter: Payer: Self-pay | Admitting: Emergency Medicine

## 2015-05-18 ENCOUNTER — Telehealth: Payer: Self-pay

## 2015-05-18 ENCOUNTER — Ambulatory Visit (INDEPENDENT_AMBULATORY_CARE_PROVIDER_SITE_OTHER): Payer: Medicare Other | Admitting: Emergency Medicine

## 2015-05-18 VITALS — BP 116/78 | HR 95 | Ht 70.0 in | Wt 234.0 lb

## 2015-05-18 DIAGNOSIS — J310 Chronic rhinitis: Secondary | ICD-10-CM

## 2015-05-18 DIAGNOSIS — R05 Cough: Secondary | ICD-10-CM

## 2015-05-18 DIAGNOSIS — R911 Solitary pulmonary nodule: Secondary | ICD-10-CM

## 2015-05-18 DIAGNOSIS — J849 Interstitial pulmonary disease, unspecified: Secondary | ICD-10-CM

## 2015-05-18 DIAGNOSIS — R918 Other nonspecific abnormal finding of lung field: Secondary | ICD-10-CM

## 2015-05-18 DIAGNOSIS — I255 Ischemic cardiomyopathy: Secondary | ICD-10-CM | POA: Diagnosis not present

## 2015-05-18 DIAGNOSIS — R059 Cough, unspecified: Secondary | ICD-10-CM | POA: Insufficient documentation

## 2015-05-18 DIAGNOSIS — G4733 Obstructive sleep apnea (adult) (pediatric): Secondary | ICD-10-CM

## 2015-05-18 DIAGNOSIS — J449 Chronic obstructive pulmonary disease, unspecified: Secondary | ICD-10-CM

## 2015-05-18 NOTE — Telephone Encounter (Signed)
Entresto approved through 01/14/2016. Ref # [id number]. Local pharmacy notified.

## 2015-05-18 NOTE — Assessment & Plan Note (Signed)
New diagnosis based on his recent PFT. He has benefited from the [place], and we will continue this

## 2015-05-18 NOTE — Patient Instructions (Signed)
We will repeat CT scan of the chest in September 2017 to look for interval change and stability Please continue Breo once a day. Remember to gargle and rinse after using this medication Continue amiodarone for now. There are some subtle changes on your CT scan that could relate to inflammation from this medication. Depending on how all these change on CT scan we may ultimately recommend that you stop amiodarone. Continue your ipratropium nasal spray and omeprazole as ordered by Dr Erik Obey Follow with Dr Lamonte Sakai in 6 months or sooner if you have any problems

## 2015-05-18 NOTE — Assessment & Plan Note (Signed)
Note to predominant nodules one in the right lower lobe and one in the left lower lobe. These will need to be followed with serial scans given his tobacco history. Certainly these could be related to old granulomatous disease or even to his amiodarone exposure although this is rare. Next CT scan in 6 months

## 2015-05-18 NOTE — Assessment & Plan Note (Signed)
Unfortunately unable to tolerate treatment

## 2015-05-18 NOTE — Progress Notes (Signed)
Subjective:    Patient ID: [name], male    DOB: 03-20-1940, 75 y.o.   MRN: [id number]  HPI 75 year old former smoker with a history of hypertension, atrial fibrillation on amiodarone, coronary disease with ischemic cardiomyopathy with ICD/pacer, formerly followed by Dr Gwenette Greet for obstructive sleep apnea on CPAP 16 cm water. He stopped using CPAP about 2 years ago due to dryness. He has tried multiple masks and humidity, but has been unable to tolerate. He is fatigued, is up at night freq to urinate. He underwent PFT that I have reviewed, shows mild mixed disease, restrictive disease on volumes, decreased diffusion capacity that corects for [place].  He describes cough that is likely related to chronic rhintis, partially treated GERD. He recently changed from flonase to ipratropium which has helped. He still coughs up secretions in the am. He was just started on albuterol - seems to benefit from this.          ROV 05/18/15 -- follow-up visit for history of dyspnea in the setting of hypertension, atrial fibrillation on amiodarone, mixed disease on pulmonary function testing, untreated obstructive sleep apnea. He had not been treated with scheduled bronchodilators but had benefited from albuterol and so we started Symbicort which was then substituted with [employer] for his insurance formulary. He was also stared on omeprazole by Dr Erik Obey, changed fluticasone to ipratropium nasal spray > cough and mucous are better. He also underwent a CT scan of the chest on 04/04/15 that person reviewed. This showed evidence for several small pulmonary nodules with some patchy subpleural reticulation and traction bronchiectasis consistent with possible early UIP.  He starts that his cough and dyspnea are significantly improved on the new meds.     Review of Systems As per HPI   Past Medical History  Diagnosis Date  . Atrial fibrillation ([place])     Ablation for a-flutter 05/2000  . Cardiomyopathy     Ischemic.  EF 20%; pt  has CRT-Device  . Hypertension   . Hyperlipidemia   . Allergic rhinitis   . Obstructive sleep apnea     CPAP 16  . CAD (coronary artery disease)     PTCA RCA 1997 (Inf/post MI);  [name] Ventricular tachycardia ([place])     ICD, pacer  . Adenomatous colon polyp 2009; 06/2012    Colonoscopy 07/2007---Dr. Barron Schmid GI--Repeat 06/2012 showed one tubular adenoma w/out high grade dysplasia.  . Nephrolithiasis     No distal stones on CT 2010; stable 8 mm RLP stone, 71m LMP stone 10/2013 plain film.  Abd u/s 06/2014: 9 mm nonobstructing lower pole right renal stone.  03/2015 urol: stable nonobstructing renal calc by CT 12/2014 and KUB 03/2015.  .Marland KitchenMyocardial infarction ([place]   . Microscopic hematuria     Cysto neg 02/2009 except for BPH-likely has microhem from his renal stone dz  . Diverticulosis of colon 2005    Noted on Colonoscopies in 2005 and 2009 and on CT 2010  . Internal hemorrhoids     Colonoscopy 07/2007  . Hepatic steatosis 2010; 2016    Noted on CT abd 2010 and on u/s abd 04/2009 (transaminasemia) and u/s 06/2014.  .Marland KitchenBPH (benign prostatic hypertrophy)     PSAs ok (followed by urologist): no LUTS  . History of impaired glucose tolerance   . Shortness of breath   . CHF (congestive heart failure) ([place]   . Pacemaker     pacermaker /ICD/CRT---LV lead replacement 12/2012.  F/u echo showed mild improvement in  mitral regurg but EF still 10%.  . Paronychia of right thumb 08/22/2012  . Gallstones 06/2014    No evidence of cholecystitis on abd u/s 06/2014  . Chronic constipation   . GERD (gastroesophageal reflux disease)   . Chronic rhinitis     ?vasomotor (Dr. Erik Obey)  . Bilateral sensorineural hearing loss      Family History  Problem Relation Age of Onset  . Heart disease Mother   . Breast cancer Maternal Grandmother   . Coronary artery disease Father     family hx of  . Heart attack Father     4 stents/ pacemaker  . Hypertension Sister   . Goiter Paternal Grandmother   . Nephrolithiasis  Son   . Other Mother     colon surgery for fistula  . Nephrolithiasis Father      Social History   Social History  . Marital Status: Married    Spouse Name: N/A  . Number of Children: 3  . Years of Education: N/A   Occupational History  . Retired    Social History Main Topics  . Smoking status: Former Smoker -- 2.00 packs/day for 15 years    Types: Cigarettes    Quit date: 01/15/1980  . Smokeless tobacco: Never Used  . Alcohol Use: 0.0 oz/week    0 Standard drinks or equivalent per week     Comment: couple glasses of wine a week  . Drug Use: No  . Sexual Activity: Not on file   Other Topics Concern  . Not on file   Social History Narrative   Married.   Hx of cigarettes: quit in the 1980s.  No alc/drugs.     Allergies  Allergen Reactions  . Sulfa Antibiotics Rash     Outpatient Prescriptions Prior to Visit  Medication Sig Dispense Refill  . albuterol (VENTOLIN HFA) 108 (90 Base) MCG/ACT inhaler Inhale 1-2 puffs into the lungs every 4 (four) hours as needed for wheezing or shortness of breath. 1 Inhaler 2  . amiodarone (PACERONE) 200 MG tablet Take 1 tablet (200 mg total) by mouth daily. 90 tablet 3  . ANUCORT-HC 25 MG suppository PLACE 1 SUPPOSITORY RECTALLY TWICE DAILY 12 suppository 0  . carvedilol (COREG) 12.5 MG tablet TAKE 1 TABLET BY MOUTH TWICE DAILY WITH A MEAL 180 tablet 1  . cholecalciferol (VITAMIN D) 1000 units tablet Take 1,000 Units by mouth daily.    [name] eplerenone (INSPRA) 25 MG tablet Take 1 tablet (25 mg total) by mouth daily. 90 tablet 3  . fluticasone furoate-vilanterol (BREO ELLIPTA) 100-25 MCG/INH AEPB Inhale 1 puff into the lungs daily. 60 each 5  . furosemide (LASIX) 40 MG tablet Take 2 tablets (80 mg total) by mouth daily. 180 tablet 3  . ipratropium (ATROVENT) 0.06 % nasal spray Place 2 sprays into the nose 4 (four) times daily. 15 mL 6  . levocetirizine (XYZAL) 5 MG tablet TAKE 1 TABLET BY MOUTH EVERY MORNING 30 tablet 11  . Magnesium 250  MG TABS Take 1 tablet by mouth daily.    [name] mexiletine (MEXITIL) 150 MG capsule Take 2 capsules (300 mg total) by mouth 2 (two) times daily. 360 capsule 3  . Multiple Vitamin (MULTIVITAMIN WITH MINERALS) TABS Take 1 tablet by mouth daily.    . nitroGLYCERIN (NITROSTAT) 0.4 MG SL tablet Place 1 tablet (0.4 mg total) under the tongue every 5 (five) minutes as needed for chest pain. 25 tablet 3  . omeprazole (PRILOSEC) 20 MG capsule Take  20 mg by mouth daily.    . polyethylene glycol (MIRALAX / GLYCOLAX) packet Take 17 g by mouth daily.    . polyvinyl alcohol-povidone (REFRESH) 1.4-0.6 % ophthalmic solution Place 1-2 drops into both eyes as needed (dry eyes).     . potassium chloride SA (K-DUR,KLOR-CON) 20 MEQ tablet TAKE 1 TABLET BY MOUTH EVERY DAY 90 tablet 1  . Probiotic Product (PROBIOTIC DAILY PO) Take 1 tablet by mouth daily.    . Psyllium (METAMUCIL PO) Take 3 teaspoons by mouth 3 times daily    . sacubitril-valsartan (ENTRESTO) 24-26 MG Take 1 tablet by mouth 2 (two) times daily. 60 tablet 6  . XARELTO 20 MG TABS tablet TAKE 1 TABLET(20 MG) BY MOUTH DAILY WITH DINNER 30 tablet 11   No facility-administered medications prior to visit.         Objective:   Physical Exam Filed Vitals:   05/18/15 1107 05/18/15 1108  BP:  116/78  Pulse:  95  Height: _0  (1.778 m)   Weight: 234 lb (106.142 kg)   SpO2:  94%   Gen: Pleasant, well-nourished, in no distress,  normal affect  ENT: No lesions,  mouth clear,  oropharynx clear, no postnasal drip  Neck: No JVD, no TMG, no carotid bruits  Lungs: No use of accessory muscles, clear without rales or rhonchi  Cardiovascular: RRR, heart sounds normal, no murmur or gallops, no peripheral edema  Musculoskeletal: No deformities, no cyanosis or clubbing  Neuro: alert, non focal  Skin: Warm, no lesions or rashes      Assessment & Plan:  Interstitial lung disease (HCC) Very mild basilar subpleural interstitial changes noted on CT scan of  the chest. Certainly these could be associated with amiodarone. Most significant observation on the CT is 2 focal nodules. I believe at this time we can follow him with a repeat CT scan before committing to stopping his amiodarone. If there is an interval increase in inflammatory change then likely need to stop the medication  COPD (chronic obstructive pulmonary disease) ([place]) New diagnosis based on his recent PFT. He has benefited from the [place], and we will continue this  Obstructive sleep apnea Unfortunately unable to tolerate treatment  Chronic rhinitis Better with the addition of ipratropium nasal spray  Cough Better with more aggressive treatment of his rhinitis and the addition of omeprazole for his GERD  Pulmonary nodules Note to predominant nodules one in the right lower lobe and one in the left lower lobe. These will need to be followed with serial scans given his tobacco history. Certainly these could be related to old granulomatous disease or even to his amiodarone exposure although this is rare. Next CT scan in 6 months

## 2015-05-18 NOTE — Assessment & Plan Note (Signed)
Very mild basilar subpleural interstitial changes noted on CT scan of the chest. Certainly these could be associated with amiodarone. Most significant observation on the CT is 2 focal nodules. I believe at this time we can follow him with a repeat CT scan before committing to stopping his amiodarone. If there is an interval increase in inflammatory change then likely need to stop the medication

## 2015-05-18 NOTE — Assessment & Plan Note (Signed)
Better with more aggressive treatment of his rhinitis and the addition of omeprazole for his GERD

## 2015-05-18 NOTE — Assessment & Plan Note (Signed)
Better with the addition of ipratropium nasal spray

## 2015-05-19 DIAGNOSIS — I5022 Chronic systolic (congestive) heart failure: Secondary | ICD-10-CM | POA: Diagnosis not present

## 2015-05-20 LAB — BASIC METABOLIC PANEL
BUN: 14 mg/dL (ref 7–25)
CO2: 28 mmol/L (ref 20–31)
Calcium: 9.2 mg/dL (ref 8.6–10.3)
Chloride: 101 mmol/L (ref 98–110)
Creat: 0.97 mg/dL (ref 0.70–1.18)
Glucose, Bld: 110 mg/dL — ABNORMAL HIGH (ref 65–99)
POTASSIUM: 4.4 mmol/L (ref 3.5–5.3)
SODIUM: 139 mmol/L (ref 135–146)

## 2015-05-20 LAB — HEPATIC FUNCTION PANEL
ALBUMIN: 3.5 g/dL — AB (ref 3.6–5.1)
ALK PHOS: 59 U/L (ref 40–115)
ALT: 22 U/L (ref 9–46)
AST: 21 U/L (ref 10–35)
BILIRUBIN TOTAL: 0.7 mg/dL (ref 0.2–1.2)
Bilirubin, Direct: 0.1 mg/dL (ref <=0.2)
Indirect Bilirubin: 0.6 mg/dL (ref 0.2–1.2)
Total Protein: 6.5 g/dL (ref 6.1–8.1)

## 2015-05-20 LAB — TSH: TSH: 2.22 m[IU]/L (ref 0.40–4.50)

## 2015-05-23 ENCOUNTER — Telehealth: Payer: Self-pay | Admitting: Internal Medicine

## 2015-05-23 NOTE — Telephone Encounter (Signed)
New Message  Pt requested to speak w/ RN concerning recent CT he had- showed nodules (per pt) . Please call back and discuss.

## 2015-05-23 NOTE — Telephone Encounter (Signed)
I spoke with the patient. He recently had a chest CT with Dr. Lamonte Sakai that revealed pulmonary nodules. The patient was concerned about this in regards to his amiodarone. He would like Dr. Caryl Comes to review.  Below are comments from Dr. Lamonte Sakai Interstitial lung disease [facility]) - Collene Gobble, MD at 05/18/2015 11:37 AM     Status: Written Related Problem: Interstitial lung disease ([place])   Expand All Collapse All   Very mild basilar subpleural interstitial changes noted on CT scan of the chest. Certainly these could be associated with amiodarone. Most significant observation on the CT is 2 focal nodules. I believe at this time we can follow him with a repeat CT scan before committing to stopping his amiodarone. If there is an interval increase in inflammatory change then likely need to stop the medication

## 2015-05-24 ENCOUNTER — Encounter: Payer: Self-pay | Admitting: Gastroenterology

## 2015-05-24 ENCOUNTER — Encounter: Payer: Self-pay | Admitting: Cardiology

## 2015-05-30 NOTE — Telephone Encounter (Signed)
I left a message for the patient to call.

## 2015-05-31 NOTE — Telephone Encounter (Signed)
F/u  Pt returning RN phone call. Please call back and discuss.

## 2015-05-31 NOTE — Telephone Encounter (Signed)
I left a message for the patient to call. 

## 2015-05-31 NOTE — Telephone Encounter (Signed)
I spoke with the patient. He is aware that I have reviewed with Dr. Caryl Comes that there is a concern that his amiodarone may have some bearing on his pulmonary nodules. I advised the patient, that per Dr. Caryl Comes, he has not heard of any relation to amiodarone and pulmonary nodules. He did look up information on this and if this is part of the cause, then the chance is very small.  Dr. Lamonte Sakai to follow as planned. The patient is agreeable.

## 2015-06-15 ENCOUNTER — Encounter: Payer: Self-pay | Admitting: Gastroenterology

## 2015-06-22 ENCOUNTER — Ambulatory Visit (INDEPENDENT_AMBULATORY_CARE_PROVIDER_SITE_OTHER): Payer: Medicare Other | Admitting: Nurse Practitioner

## 2015-06-22 ENCOUNTER — Encounter: Payer: Self-pay | Admitting: Nurse Practitioner

## 2015-06-22 VITALS — BP 118/78 | HR 64 | Ht 70.0 in | Wt 226.2 lb

## 2015-06-22 DIAGNOSIS — I472 Ventricular tachycardia, unspecified: Secondary | ICD-10-CM

## 2015-06-22 DIAGNOSIS — I480 Paroxysmal atrial fibrillation: Secondary | ICD-10-CM | POA: Diagnosis not present

## 2015-06-22 DIAGNOSIS — I255 Ischemic cardiomyopathy: Secondary | ICD-10-CM | POA: Diagnosis not present

## 2015-06-22 DIAGNOSIS — I5022 Chronic systolic (congestive) heart failure: Secondary | ICD-10-CM

## 2015-06-22 LAB — CUP PACEART INCLINIC DEVICE CHECK
Brady Statistic RA Percent Paced: 100 %
Brady Statistic RV Percent Paced: 100 %
Date Time Interrogation Session: 20170608040000
HighPow Impedance: 38 Ohm
HighPow Impedance: 53 Ohm
Implantable Lead Implant Date: 20020527
Implantable Lead Implant Date: 20020527
Implantable Lead Implant Date: 20131212
Implantable Lead Location: 753858
Implantable Lead Location: 753859
Implantable Lead Location: 753860
Implantable Lead Model: 148
Implantable Lead Model: 4194
Implantable Lead Model: 6940
Implantable Lead Serial Number: 120244
Lead Channel Impedance Value: 592 Ohm
Lead Channel Impedance Value: 698 Ohm
Lead Channel Impedance Value: 776 Ohm
Lead Channel Pacing Threshold Amplitude: 0.7 V
Lead Channel Pacing Threshold Amplitude: 1.2 V
Lead Channel Pacing Threshold Amplitude: 1.3 V
Lead Channel Pacing Threshold Pulse Width: 0.3 ms
Lead Channel Pacing Threshold Pulse Width: 0.4 ms
Lead Channel Pacing Threshold Pulse Width: 0.7 ms
Lead Channel Sensing Intrinsic Amplitude: 5.5 mV
Lead Channel Setting Pacing Amplitude: 2 V
Lead Channel Setting Pacing Amplitude: 2.5 V
Lead Channel Setting Pacing Amplitude: 2.5 V
Lead Channel Setting Pacing Pulse Width: 0.4 ms
Lead Channel Setting Pacing Pulse Width: 0.7 ms
Lead Channel Setting Sensing Sensitivity: 0.5 mV
Lead Channel Setting Sensing Sensitivity: 1 mV
Pulse Gen Serial Number: 492371

## 2015-06-22 NOTE — Progress Notes (Signed)
Electrophysiology Office Note Date: 06/22/2015  ID:  [name], DOB 1940/07/11, MRN [id number]  PCP: [name], MD Primary Cardiologist: Stanford Breed Electrophysiologist: Caryl Comes  CC: Routine ICD follow-up  [name] is a 75 y.o. male seen today for Dr Caryl Comes.  He presents today for routine electrophysiology followup.  Since last being seen in our clinic, the patient reports doing very well. He denies chest pain, palpitations, dyspnea, PND, orthopnea, nausea, vomiting, dizziness, syncope, edema, weight gain, or early satiety.  He has not had ICD shocks.   Device History: BSX dual chamber ICD implanted 2002 for ICM; upgrade to CRTD 2008; gen change 2011; LV lead revision 2013 History of appropriate therapy: Yes History of AAD therapy: Yes - amiodarone    Past Medical History  Diagnosis Date  . Atrial fibrillation ([place])   . Ischemic cardiomyopathy   . Hypertension   . Hyperlipidemia   . Obstructive sleep apnea     CPAP 16  . CAD (coronary artery disease)     PTCA RCA 1997 (Inf/post MI);  [name] Ventricular tachycardia ([place])   . Adenomatous colon polyp 2009; 06/2012    Colonoscopy 07/2007---Dr. Barron Schmid GI--Repeat 06/2012 showed one tubular adenoma w/out high grade dysplasia.  . Nephrolithiasis     No distal stones on CT 2010; stable 8 mm RLP stone, 55m LMP stone 10/2013 plain film.  Abd u/s 06/2014: 9 mm nonobstructing lower pole right renal stone.  03/2015 urol: stable nonobstructing renal calc by CT 12/2014 and KUB 03/2015.  . Microscopic hematuria     Cysto neg 02/2009 except for BPH-likely has microhem from his renal stone dz  . Diverticulosis of colon 2005    Noted on Colonoscopies in 2005 and 2009 and on CT 2010  . Hepatic steatosis 2010; 2016    Noted on CT abd 2010 and on u/s abd 04/2009 (transaminasemia) and u/s 06/2014.  .Marland KitchenBPH (benign prostatic hypertrophy)     PSAs ok (followed by urologist): no LUTS  . CHF (congestive heart failure) [place]   . Gallstones 06/2014   No evidence of cholecystitis on abd u/s 06/2014  . Chronic constipation   . GERD (gastroesophageal reflux disease)   . Chronic rhinitis     ?vasomotor (Dr. wErik Obey  . Bilateral sensorineural hearing loss    Past Surgical History  Procedure Laterality Date  . Icd      a) Guidant Contak H170- SVirl Axe MD 07/25/06 (second device) b) Medtronic CRT-D  . Breast surgery Left 1990    "Tissue removed from breast"  . Inguinal hernia repair Bilateral 1975  . Extracorporeal shock wave lithotripsy    . Atrial ablation surgery      flutter ablation 2002  . Cardiac catheterization    . Esophagogastroduodenoscopy N/A 07/13/2012    Procedure: ESOPHAGOGASTRODUODENOSCOPY (EGD);  Surgeon: MJeryl Columbia MD;  [facility];  Service: Endoscopy;  Laterality: N/A;  NORMAL  . Colonoscopy N/A 07/13/2012    Diverticula ascending colon.  Polyp x 1-recall 5 yrs (Dr. MWatt Climes Procedure: COLONOSCOPY;  Surgeon: MJeryl Columbia MD;  Location: [facility];  Service: Endoscopy;  Laterality: N/A; polypectomy x 1   . Carotid doppler  09/25/12    No signif stenosis  . Lead revision Bilateral 12/26/2011    Procedure: LEAD REVISION;  Surgeon: GEvans Lance MD;  [facility];  Service: Cardiovascular;  Laterality: Bilateral;  . Pfts  02/2015    Mod restriction (interstitial), mod diffusion defect (Dr. YAnnamaria Boots  Current Outpatient Prescriptions  Medication Sig Dispense Refill  . albuterol (VENTOLIN HFA) 108 (90 Base) MCG/ACT inhaler Inhale 1-2 puffs into the lungs every 4 (four) hours as needed for wheezing or shortness of breath. 1 Inhaler 2  . amiodarone (PACERONE) 200 MG tablet Take 1 tablet (200 mg total) by mouth daily. 90 tablet 3  . ANUCORT-HC 25 MG suppository PLACE 1 SUPPOSITORY RECTALLY TWICE DAILY 12 suppository 0  . carvedilol (COREG) 12.5 MG tablet TAKE 1 TABLET BY MOUTH TWICE DAILY WITH A MEAL 180 tablet 1  . cholecalciferol (VITAMIN D) 1000 units tablet Take 1,000 Units by mouth daily.      [name] eplerenone (INSPRA) 25 MG tablet Take 1 tablet (25 mg total) by mouth daily. 90 tablet 3  . fluticasone furoate-vilanterol (BREO ELLIPTA) 100-25 MCG/INH AEPB Inhale 1 puff into the lungs daily. 60 each 5  . furosemide (LASIX) 40 MG tablet Take 2 tablets (80 mg total) by mouth daily. 180 tablet 3  . ipratropium (ATROVENT) 0.06 % nasal spray Place 2 sprays into the nose 4 (four) times daily. 15 mL 6  . levocetirizine (XYZAL) 5 MG tablet TAKE 1 TABLET BY MOUTH EVERY MORNING 30 tablet 11  . Magnesium 250 MG TABS Take 1 tablet by mouth daily.    [name] mexiletine (MEXITIL) 150 MG capsule Take 2 capsules (300 mg total) by mouth 2 (two) times daily. 360 capsule 3  . Multiple Vitamin (MULTIVITAMIN WITH MINERALS) TABS Take 1 tablet by mouth daily.    . nitroGLYCERIN (NITROSTAT) 0.4 MG SL tablet Place 1 tablet (0.4 mg total) under the tongue every 5 (five) minutes as needed for chest pain. 25 tablet 3  . omeprazole (PRILOSEC) 20 MG capsule Take 20 mg by mouth daily.    . polyethylene glycol (MIRALAX / GLYCOLAX) packet Take 17 g by mouth daily.    . polyvinyl alcohol-povidone (REFRESH) 1.4-0.6 % ophthalmic solution Place 1-2 drops into both eyes as needed (dry eyes).     . potassium chloride SA (K-DUR,KLOR-CON) 20 MEQ tablet TAKE 1 TABLET BY MOUTH EVERY DAY 90 tablet 1  . Probiotic Product (PROBIOTIC DAILY PO) Take 1 tablet by mouth daily.    . Psyllium (METAMUCIL PO) Take 3 teaspoons by mouth 3 times daily    . sacubitril-valsartan (ENTRESTO) 24-26 MG Take 1 tablet by mouth 2 (two) times daily. 60 tablet 6  . XARELTO 20 MG TABS tablet TAKE 1 TABLET(20 MG) BY MOUTH DAILY WITH DINNER 30 tablet 11   No current facility-administered medications for this visit.    Allergies:   Sulfa antibiotics   Social History: Social History   Social History  . Marital Status: Married    Spouse Name: N/A  . Number of Children: 3  . Years of Education: N/A   Occupational History  . Retired    Social History Main  Topics  . Smoking status: Former Smoker -- 2.00 packs/day for 15 years    Types: Cigarettes    Quit date: 01/15/1980  . Smokeless tobacco: Never Used  . Alcohol Use: 0.0 oz/week    0 Standard drinks or equivalent per week     Comment: couple glasses of wine a week  . Drug Use: No  . Sexual Activity: Not on file   Other Topics Concern  . Not on file   Social History Narrative   Married.   Hx of cigarettes: quit in the 1980s.  No alc/drugs.    Family History: Family History  Problem Relation Age  of Onset  . Heart disease Mother   . Breast cancer Maternal Grandmother   . Coronary artery disease Father     family hx of  . Heart attack Father     4 stents/ pacemaker  . Hypertension Sister   . Goiter Paternal Grandmother   . Nephrolithiasis Son   . Other Mother     colon surgery for fistula  . Nephrolithiasis Father     Review of Systems: All other systems reviewed and are otherwise negative except as noted above.   Physical Exam: VS:  BP 118/78 mmHg  Pulse 64  Ht _0  (1.778 m)  Wt 226 lb 3.2 oz (102.604 kg)  BMI 32.46 kg/m2  SpO2 96% , BMI Body mass index is 32.46 kg/(m^2).  GEN- The patient is well appearing, alert and oriented x 3 today.   HEENT: normocephalic, atraumatic; sclera clear, conjunctiva pink; hearing intact; oropharynx clear; neck supple, no JVP Lymph- no cervical lymphadenopathy Lungs- Clear to ausculation bilaterally, normal work of breathing.  No wheezes, rales, rhonchi Heart- Regular rate and rhythm, no murmurs, rubs or gallops, PMI not laterally displaced GI- soft, non-tender, non-distended, bowel sounds present, no hepatosplenomegaly Extremities- no clubbing, cyanosis, or edema; DP/PT/radial pulses 2+ bilaterally MS- no significant deformity or atrophy Skin- warm and dry, no rash or lesion; ICD pocket well healed Psych- euthymic mood, full affect Neuro- strength and sensation are intact  ICD interrogation- reviewed in detail today,  See  PACEART report  EKG:  EKG is not ordered today.  Recent Labs: 03/29/2015: Hemoglobin 15.0; Platelets 325.0 05/19/2015: ALT 22; BUN 14; Creat 0.97; Potassium 4.4; Sodium 139; TSH 2.22   Wt Readings from Last 3 Encounters:  06/22/15 226 lb 3.2 oz (102.604 kg)  05/18/15 234 lb (106.142 kg)  05/11/15 229 lb (103.874 kg)     Other studies Reviewed: Additional studies/ records that were reviewed today include: Dr Maryruth Bun, [facility] office notes  Assessment and Plan:  1.  Chronic systolic dysfunction euvolemic today Stable on an appropriate medical regimen Normal ICD function See Pace Art report No changes today AV optimization echo 01/2015 w Dr Caryl Comes changes: PAV 240->275mec, SAV 170->2033mc, LV offset to -4086m  2.  CAD/ICM No recent ischemic symptoms Continue medical therapy No statin with previously increased LFT's No ASA with Xarelto  3.  Ventricular tachycardia No recent recurrence Continue amiodarone PFT's followed by pulmonary.  LFT's, TSH recently normal  4.  Paroxysmal atrial fibrillation Burden by device interrogation today 0% V rates controlled Continue Xarelto for CHADS2VASC of 3 Recent BMET normal  He has a planned colonoscopy.  I told him with no prior hx of stroke and low AF burden would be ok to hold Xarelto for 2 days ahead of time and resume when ok with GI.   Current medicines are reviewed at length with the patient today.   The patient does not have concerns regarding his medicines.  The following changes were made today:  none  Labs/ tests ordered today include: none     Disposition:   Follow up with [facility] transmissions, Dr CreStanford Breed scheduled, Dr KleCaryl Comesmonths    Signed, AmbChanetta MarshallP 06/22/2015 12:56 PM  [place] [phone]) ([phone])

## 2015-06-22 NOTE — Patient Instructions (Signed)
Medication Instructions:   Your physician recommends that you continue on your current medications as directed. Please refer to the Current Medication list given to you today.   If you need a refill on your cardiac medications before your next appointment, please call your pharmacy.  Labwork:  NONE ORDER TODAY    Testing/Procedures:  NONE ORDER TODAY    Follow-Up: Your physician wants you to follow-up in:  IN  6   MONTHS WITH DR   [name] will receive a reminder letter in the mail two months in advance. If you don't receive a letter, please call our office to schedule the follow-up appointment.  Remote monitoring is used to monitor your Pacemaker of ICD from home. This monitoring reduces the number of office visits required to check your device to one time per year. It allows [place] to keep an eye on the functioning of your device to ensure it is working properly. You are scheduled for a device check from home on .09/21/2015..You may send your transmission at any time that day. If you have a wireless device, the transmission will be sent automatically. After your physician reviews your transmission, you will receive a postcard with your next transmission date.        Any Other Special Instructions Will Be Listed Below (If Applicable).                                                                                                                                                    If you need a refill on your cardiac medications before your next appointment, please call your pharmacy.  Labwork:   Testing/Procedures:   Follow-Up:   Any Other Special Instructions Will Be Listed Below (If Applicable).

## 2015-07-10 ENCOUNTER — Ambulatory Visit (INDEPENDENT_AMBULATORY_CARE_PROVIDER_SITE_OTHER): Payer: Medicare Other | Admitting: Family Medicine

## 2015-07-10 ENCOUNTER — Encounter: Payer: Self-pay | Admitting: Family Medicine

## 2015-07-10 VITALS — BP 122/76 | HR 78 | Temp 98.1°F | Resp 17 | Ht 70.0 in | Wt 220.0 lb

## 2015-07-10 DIAGNOSIS — I255 Ischemic cardiomyopathy: Secondary | ICD-10-CM | POA: Diagnosis not present

## 2015-07-10 DIAGNOSIS — Z23 Encounter for immunization: Secondary | ICD-10-CM | POA: Diagnosis not present

## 2015-07-10 DIAGNOSIS — E559 Vitamin D deficiency, unspecified: Secondary | ICD-10-CM | POA: Diagnosis not present

## 2015-07-10 DIAGNOSIS — J449 Chronic obstructive pulmonary disease, unspecified: Secondary | ICD-10-CM

## 2015-07-10 DIAGNOSIS — I10 Essential (primary) hypertension: Secondary | ICD-10-CM

## 2015-07-10 DIAGNOSIS — G4733 Obstructive sleep apnea (adult) (pediatric): Secondary | ICD-10-CM

## 2015-07-10 DIAGNOSIS — I5022 Chronic systolic (congestive) heart failure: Secondary | ICD-10-CM

## 2015-07-10 DIAGNOSIS — E785 Hyperlipidemia, unspecified: Secondary | ICD-10-CM | POA: Diagnosis not present

## 2015-07-10 DIAGNOSIS — I480 Paroxysmal atrial fibrillation: Secondary | ICD-10-CM

## 2015-07-10 NOTE — Patient Instructions (Signed)
Schedule your physical in 6 months Schedule a lab visit for later this week- please arrive fasting We'll notify you of your lab results and make any changes if needed Keep up the good work- you look great! Call with any questions or concerns Welcome!  We're glad to have you!!!

## 2015-07-10 NOTE — Progress Notes (Signed)
Pre visit review using our clinic review tool, if applicable. No additional management support is needed unless otherwise documented below in the visit note

## 2015-07-10 NOTE — Progress Notes (Signed)
   Subjective:    Patient ID: [name], male    DOB: November 17, 1940, 75 y.o.   MRN: [id number]  HPI New to establish.  Previous MD- [facility]  Multiple specialists- see care team for specifics.  Vit D deficiency- due for repeat labs.  Afib- chronic problem, on Mexitil, Xarelto.  Following w/ Dr Caryl Comes for ICD check.  Pt is being paced '100% of the time'.  CHF- chronic problem, on Amiodarone- having regular eye exams, Coreg, Mexitil, Estresto.  Following w/ Dr Caryl Comes and Dr Stanford Breed.  EF 20-25%.  No CP.  + SOB and fatigue at baseline.  Pt reports he started to feel better since takine Entresto.  COPD- chronic problem, on Inspra, Breo, Atrovent and albuterol PRN.  Following w/ Dr Lamonte Sakai.  Pt has pulmonary nodules that Dr Lamonte Sakai is following- plan is for repeat CT scan in 6 months.  HTN- chronic problem, on Entresto and Coreg w/ good control.  Denies HAs, visual changes, edema.  Hyperlipidemia- chronic problem, not currently on a statin.  Has not had recent labs.  Denies abd pain, N/V.  OSA- chronic problem, has CPAP but states he can't use this due to dry mouth.  Following w/ Dr Lamonte Sakai.  Dr Lamonte Sakai had told him if he was <200 lbs he would no longer need the CPAP.  Pt is now at 220- down from 267.   Review of Systems For ROS see HPI     Objective:   Physical Exam  Constitutional: He is oriented to person, place, and time. He appears well-developed and well-nourished. No distress.  HENT:  Head: Normocephalic and atraumatic.  Eyes: Conjunctivae and EOM are normal. Pupils are equal, round, and reactive to light.  Neck: Normal range of motion. Neck supple. No thyromegaly present.  Cardiovascular: Normal rate, regular rhythm, normal heart sounds and intact distal pulses.   No murmur heard. Pulmonary/Chest: Effort normal and breath sounds normal. No respiratory distress.  Abdominal: Soft. Bowel sounds are normal. He exhibits no distension.  Musculoskeletal: He exhibits no edema.  Lymphadenopathy:      He has no cervical adenopathy.  Neurological: He is alert and oriented to person, place, and time. No cranial nerve deficit.  Skin: Skin is warm and dry.  Psychiatric: He has a normal mood and affect. His behavior is normal.  Vitals reviewed.         Assessment & Plan:

## 2015-07-12 ENCOUNTER — Other Ambulatory Visit (INDEPENDENT_AMBULATORY_CARE_PROVIDER_SITE_OTHER): Payer: Medicare Other

## 2015-07-12 DIAGNOSIS — E559 Vitamin D deficiency, unspecified: Secondary | ICD-10-CM | POA: Diagnosis not present

## 2015-07-12 DIAGNOSIS — I10 Essential (primary) hypertension: Secondary | ICD-10-CM

## 2015-07-12 DIAGNOSIS — E785 Hyperlipidemia, unspecified: Secondary | ICD-10-CM | POA: Diagnosis not present

## 2015-07-12 LAB — LIPID PANEL
CHOLESTEROL: 232 mg/dL — AB (ref 0–200)
HDL: 31.4 mg/dL — ABNORMAL LOW (ref >39.00)
NonHDL: 200.21
TRIGLYCERIDES: 218 mg/dL — AB (ref 0.0–149.0)
Total CHOL/HDL Ratio: 7
VLDL: 43.6 mg/dL — ABNORMAL HIGH (ref 0.0–40.0)

## 2015-07-12 LAB — CBC WITH DIFFERENTIAL/PLATELET
BASOS ABS: 0 10*3/uL (ref 0.0–0.1)
BASOS PCT: 0.2 % (ref 0.0–3.0)
EOS PCT: 5.8 % — AB (ref 0.0–5.0)
Eosinophils Absolute: 0.5 10*3/uL (ref 0.0–0.7)
HEMATOCRIT: 46.3 % (ref 39.0–52.0)
Hemoglobin: 15.5 g/dL (ref 13.0–17.0)
LYMPHS ABS: 2.3 10*3/uL (ref 0.7–4.0)
LYMPHS PCT: 25.7 % (ref 12.0–46.0)
MCHC: 33.4 g/dL (ref 30.0–36.0)
MCV: 101.1 fl — AB (ref 78.0–100.0)
MONOS PCT: 6.6 % (ref 3.0–12.0)
Monocytes Absolute: 0.6 10*3/uL (ref 0.1–1.0)
NEUTROS ABS: 5.6 10*3/uL (ref 1.4–7.7)
Neutrophils Relative %: 61.7 % (ref 43.0–77.0)
PLATELETS: 322 10*3/uL (ref 150.0–400.0)
RBC: 4.58 Mil/uL (ref 4.22–5.81)
RDW: 14.3 % (ref 11.5–15.5)
WBC: 9 10*3/uL (ref 4.0–10.5)

## 2015-07-12 LAB — LDL CHOLESTEROL, DIRECT: Direct LDL: 168 mg/dL

## 2015-07-12 LAB — VITAMIN D 25 HYDROXY (VIT D DEFICIENCY, FRACTURES): VITD: 37.17 ng/mL (ref 30.00–100.00)

## 2015-07-13 ENCOUNTER — Other Ambulatory Visit: Payer: Self-pay | Admitting: General Practice

## 2015-07-13 DIAGNOSIS — E785 Hyperlipidemia, unspecified: Secondary | ICD-10-CM

## 2015-07-13 MED ORDER — ROSUVASTATIN CALCIUM 10 MG PO TABS: 10.0000 mg | ORAL_TABLET | Freq: Every day | ORAL | Status: DC

## 2015-07-14 ENCOUNTER — Telehealth: Payer: Self-pay | Admitting: Cardiology

## 2015-07-14 NOTE — Telephone Encounter (Signed)
Acknowledged, and recommendations communicated to patient. He voiced understanding and thanks.

## 2015-07-14 NOTE — Telephone Encounter (Signed)
Agree with assessment and plan. He could try three times weekly dosing if he is unable to tolerate daily dosing. We are happy to see him in lipid clinic if needed.

## 2015-07-14 NOTE — Telephone Encounter (Signed)
Returned call. Pt prescribed 74m crestor daily starting 07/10/15 by Dr. TBirdie Riddled/t elevated chol. He has history of statin intolerance to lipitor and to higher dose crestor (used to take 243mdaily ~5 years ago).  Pt wanted to know if safe to take 1029mrestor dose. Has already picked up from pharmacy. Recommended to start, as this was prescribed by his PCP. Explained normal protocol for stopping statins due to intolerance/SE's. Recommended he call Dr. TabVirgil Benedictfice if he develops myalgias/cramping/other assoc SE's like he has had in the past. We also discussed option of lipid clinic referral. Pt follows up w Dr. CreStanford Breed 07/26/15. Will route- see if anything additional advised by pharmD at this time.

## 2015-07-14 NOTE — Telephone Encounter (Signed)
New message      The pt saw the Md at [facility] did blood work and check cholesterol 232 and triglyceride 218   Pt c/o medication issue:  1. Name of Medication: Crestor  2. How are you currently taking this medication (dosage and times per day)? 10 mg po daily take it at night 3. Are you having a reaction (difficulty breathing--STAT)? no  4. What is your medication issue? The pt wants to speak with a nurse before starting the medication

## 2015-07-15 NOTE — Assessment & Plan Note (Signed)
Chronic problem.  Adequate control.  Asymptomatic at this time.  Check labs.  No anticipated med changes.  Will follow.

## 2015-07-15 NOTE — Assessment & Plan Note (Signed)
Pt reports he is not able to use CPAP due to dry mouth.  Encouraged him to discuss this w/ Dr Lamonte Sakai and use Biotine to assist w/ his drive mouth.  Pt expressed understanding and is in agreement w/ plan.

## 2015-07-15 NOTE — Assessment & Plan Note (Signed)
Chronic problem.  Not currently on a statin.  Check labs.  Start meds prn

## 2015-07-15 NOTE — Assessment & Plan Note (Signed)
Chronic problem.  Following w/ Cards.  Now on Entresto which has improved his sxs of fatigue and SOB.  Will follow along.

## 2015-07-15 NOTE — Assessment & Plan Note (Signed)
Chronic problem.  Following w/ Dr Caryl Comes.  Currently asymptomatic.  On Xarelto for anticoagulation.  Will follow along.

## 2015-07-15 NOTE — Assessment & Plan Note (Signed)
Chronic problem.  Following w/ Dr Lamonte Sakai.  sxs are currently well controlled on Breo and Atrovent w/ albuterol prn.  Will follow along and assist as able.

## 2015-07-15 NOTE — Assessment & Plan Note (Signed)
Chronic problem.  Check labs.  Replete prn.

## 2015-07-20 ENCOUNTER — Telehealth: Payer: Self-pay | Admitting: *Deleted

## 2015-07-20 NOTE — Telephone Encounter (Signed)
Also dr Silverio Decamp, with his chronic constipation, does he need a 2 day prep ?? Thanks, marie PV

## 2015-07-20 NOTE — Telephone Encounter (Signed)
Dr Silverio Decamp, This patient is scheduled for a PV 7-14 with a colon in the [facility] 7-27 Thursday.  You saw him in clinic 02-17-2015. He has a VERY complicated cardiac history. He has a defibrillator, hx of vtach, a fib, on Xarelto, and his last echo showed an EF of only 20-25% on 02-07-2015.  He does have a hx of colon polyps and his last colon was 07-13-2012 with [facility].   He does not qualify for a [facility] procedure due to his EF.  Can he be scheduled at [facility]?  Please advise  Thank  you, Marijean Niemann

## 2015-07-20 NOTE — Telephone Encounter (Signed)
Patient is on schedule at [facility] for Colonoscopy 08/18/2015 at [facility] on and scheduled appointment to save the spot  Will leave on [place] schedule till we hear back from Dr Silverio Decamp

## 2015-07-20 NOTE — Telephone Encounter (Signed)
Dr Silverio Decamp, Can we move him to [facility] on 08/18/2015 your Friday Morning block?

## 2015-07-20 NOTE — Telephone Encounter (Signed)
Shirlean Mylar, do you have any hospital days for Dr Silverio Decamp?

## 2015-07-24 NOTE — Telephone Encounter (Signed)
Ok, please advise patient to do extended bowel prep 2 days and will need to ask cardiology if ok to hold Xarelto pre procedure for 2 days. Thanks

## 2015-07-24 NOTE — Progress Notes (Signed)
HPI: FU history of coronary artery disease, mixed ischemic and nonischemic cardiomyopathy, history of ICD, paroxysmal atrial fibrillation, and ventricular tachycardia. Previous abdominal ultrasound in April 2006 showed no aneurysm. Last cardiac catheterization in October 2008 showed an occluded right coronary artery with left-to-right collateralization. Ejection fraction is 20%. There was no other coronary disease noted. Last Myoview in Dec 2011 showed prior inferior infarct but no ischemia. His ejection fraction was 19%. ABIs in December of 2011 were normal. Previous carotid Dopplers in Sept 2014 showed 0-39% stenosis bilaterally.The patient has had upgrade of his device to CRT-D. Patient had revision of his left ventricular lead in December 2013. Echocardiogram January 2017 showed ejection fraction 09-38%, grade 1 diastolic dysfunction, mild mitral regurgitation, severe left atrial enlargement mild right sided enlargement and mild tricuspid regurgitation. Chest CT March 2017 showed interstitial lung disease. Nodule noted in right lower ended in 3 months. Dr Lamonte Sakai felt not significantly changed since 2015. Since last seen, the patient has dyspnea with more extreme activities but not with routine activities. It is relieved with rest. It is not associated with chest pain. There is no orthopnea, PND or pedal edema. There is no syncope or palpitations. There is no exertional chest pain. Some fatigue.   Current Outpatient Prescriptions  Medication Sig Dispense Refill  . albuterol (VENTOLIN HFA) 108 (90 Base) MCG/ACT inhaler Inhale 1-2 puffs into the lungs every 4 (four) hours as needed for wheezing or shortness of breath. 1 Inhaler 2  . amiodarone (PACERONE) 200 MG tablet Take 1 tablet (200 mg total) by mouth daily. 90 tablet 3  . ANUCORT-HC 25 MG suppository PLACE 1 SUPPOSITORY RECTALLY TWICE DAILY 12 suppository 0  . carvedilol (COREG) 12.5 MG tablet TAKE 1 TABLET BY MOUTH TWICE DAILY WITH A MEAL  180 tablet 1  . cholecalciferol (VITAMIN D) 1000 units tablet Take 1,000 Units by mouth daily.    [name] eplerenone (INSPRA) 25 MG tablet Take 1 tablet (25 mg total) by mouth daily. 90 tablet 3  . fluticasone furoate-vilanterol (BREO ELLIPTA) 100-25 MCG/INH AEPB Inhale 1 puff into the lungs daily. 60 each 5  . furosemide (LASIX) 40 MG tablet Take 2 tablets (80 mg total) by mouth daily. 180 tablet 3  . ipratropium (ATROVENT) 0.06 % nasal spray Place 2 sprays into the nose 4 (four) times daily. 15 mL 6  . levocetirizine (XYZAL) 5 MG tablet TAKE 1 TABLET BY MOUTH EVERY MORNING 30 tablet 11  . Magnesium 250 MG TABS Take 1 tablet by mouth daily.    [name] mexiletine (MEXITIL) 150 MG capsule Take 2 capsules (300 mg total) by mouth 2 (two) times daily. 360 capsule 3  . Multiple Vitamin (MULTIVITAMIN WITH MINERALS) TABS Take 1 tablet by mouth daily.    . nitroGLYCERIN (NITROSTAT) 0.4 MG SL tablet Place 1 tablet (0.4 mg total) under the tongue every 5 (five) minutes as needed for chest pain. 25 tablet 3  . omeprazole (PRILOSEC) 20 MG capsule Take 20 mg by mouth daily.    . polyethylene glycol (MIRALAX / GLYCOLAX) packet Take 17 g by mouth daily.    . polyvinyl alcohol-povidone (REFRESH) 1.4-0.6 % ophthalmic solution Place 1-2 drops into both eyes as needed (dry eyes).     . potassium chloride SA (K-DUR,KLOR-CON) 20 MEQ tablet TAKE 1 TABLET BY MOUTH EVERY DAY 90 tablet 1  . Probiotic Product (PROBIOTIC DAILY PO) Take 1 tablet by mouth daily.    . Psyllium (METAMUCIL PO) Take 3 teaspoons by mouth  3 times daily    . rosuvastatin (CRESTOR) 10 MG tablet Take 1 tablet (10 mg total) by mouth daily. 90 tablet 1  . sacubitril-valsartan (ENTRESTO) 24-26 MG Take 1 tablet by mouth 2 (two) times daily. 60 tablet 6  . XARELTO 20 MG TABS tablet TAKE 1 TABLET(20 MG) BY MOUTH DAILY WITH DINNER 30 tablet 11   No current facility-administered medications for this visit.     Past Medical History  Diagnosis Date  . Atrial  fibrillation ([place])   . Ischemic cardiomyopathy   . Hypertension   . Hyperlipidemia   . Obstructive sleep apnea     CPAP 16  . CAD (coronary artery disease)     PTCA RCA 1997 (Inf/post MI);  [name] Ventricular tachycardia ([place])   . Adenomatous colon polyp 2009; 06/2012    Colonoscopy 07/2007---Dr. Barron Schmid GI--Repeat 06/2012 showed one tubular adenoma w/out high grade dysplasia.  . Nephrolithiasis     No distal stones on CT 2010; stable 8 mm RLP stone, 63m LMP stone 10/2013 plain film.  Abd u/s 06/2014: 9 mm nonobstructing lower pole right renal stone.  03/2015 urol: stable nonobstructing renal calc by CT 12/2014 and KUB 03/2015.  . Microscopic hematuria     Cysto neg 02/2009 except for BPH-likely has microhem from his renal stone dz  . Diverticulosis of colon 2005    Noted on Colonoscopies in 2005 and 2009 and on CT 2010  . Hepatic steatosis 2010; 2016    Noted on CT abd 2010 and on u/s abd 04/2009 (transaminasemia) and u/s 06/2014.  .Marland KitchenBPH (benign prostatic hypertrophy)     PSAs ok (followed by urologist): no LUTS  . [place]   . Gallstones 06/2014    No evidence of cholecystitis on abd u/s 06/2014  . Chronic constipation   . GERD (gastroesophageal reflux disease)   . Chronic rhinitis     ?vasomotor (Dr. wErik Obey  . Bilateral sensorineural hearing loss     Past Surgical History  Procedure Laterality Date  . Icd      a) Guidant Contak H170- SVirl Axe MD 07/25/06 (second device) b) Medtronic CRT-D  . Breast surgery Left 1990    "Tissue removed from breast"  . Inguinal hernia repair Bilateral 1975  . Extracorporeal shock wave lithotripsy    . Atrial ablation surgery      flutter ablation 2002  . Cardiac catheterization    . Esophagogastroduodenoscopy N/A 07/13/2012    Procedure: ESOPHAGOGASTRODUODENOSCOPY (EGD);  Surgeon: MJeryl Columbia MD;  [facility];  Service: Endoscopy;  Laterality: N/A;  NORMAL  . Colonoscopy N/A 07/13/2012    Diverticula  ascending colon.  Polyp x 1-recall 5 yrs (Dr. MWatt Climes Procedure: COLONOSCOPY;  Surgeon: MJeryl Columbia MD;  [facility];  Service: Endoscopy;  Laterality: N/A; polypectomy x 1   . Carotid doppler  09/25/12    No signif stenosis  . Lead revision Bilateral 12/26/2011    Procedure: LEAD REVISION;  Surgeon: GEvans Lance MD;  [facility];  Service: Cardiovascular;  Laterality: Bilateral;  . Pfts  02/2015    Mod restriction (interstitial), mod diffusion defect (Dr. YAnnamaria Boots    Social History   Social History  . Marital Status: Married    Spouse Name: N/A  . Number of Children: 3  . Years of Education: N/A   Occupational History  . Retired    Social History Main Topics  . Smoking status: Former Smoker -- 2.00 packs/day  for 15 years    Types: Cigarettes    Quit date: 01/15/1980  . Smokeless tobacco: Never Used  . Alcohol Use: 0.0 oz/week    0 Standard drinks or equivalent per week     Comment: couple glasses of wine a week  . Drug Use: No  . Sexual Activity: Not on file   Other Topics Concern  . Not on file   Social History Narrative   Married.   Hx of cigarettes: quit in the 1980s.  No alc/drugs.    Family History  Problem Relation Age of Onset  . Heart disease Mother   . Breast cancer Maternal Grandmother   . Coronary artery disease Father     family hx of  . Heart attack Father     4 stents/ pacemaker  . Hypertension Sister   . Goiter Paternal Grandmother   . Nephrolithiasis Son   . Other Mother     colon surgery for fistula  . Nephrolithiasis Father     ROS: Fatigue but no fevers or chills, productive cough, hemoptysis, dysphasia, odynophagia, melena, hematochezia, dysuria, hematuria, rash, seizure activity, orthopnea, PND, pedal edema, claudication. Remaining systems are negative.  Physical Exam: Well-developed well-nourished in no acute distress.  Skin is warm and dry.  HEENT is normal.  Neck is supple.  Chest is clear to auscultation with  normal expansion.  Cardiovascular exam is regular rate and rhythm.  Abdominal exam nontender or distended. No masses palpated. Extremities show no edema. neuro grossly intact  ECG AV paced  Assessment and plan 1 ventricular tachycardia-continue amiodarone, beta blocker and mexiletine. We will await follow-up CT scans and recommendations by pulmonary before discontinuing amiodarone. Input appreciated. 2 chronic systolic congestive heart failure-euvolemic on examination. Continue present dose of diuretics. 3 hyperlipidemia-Continue statin. 4 hypertension-blood pressure controlled. Continue present medications. 5 ischemic cardiomyopathy-continue beta blocker and entresto. 6 coronary artery disease-No aspirin given need for anticoagulation. Continue statin. 7 status post biventricular ICD-followed by electrophysiology. 8 atrial fibrillation-continue amiodarone and xarelto.  Kirk Ruths, MD

## 2015-07-26 ENCOUNTER — Encounter: Payer: Self-pay | Admitting: Cardiology

## 2015-07-26 ENCOUNTER — Ambulatory Visit (INDEPENDENT_AMBULATORY_CARE_PROVIDER_SITE_OTHER): Payer: Medicare Other | Admitting: Cardiology

## 2015-07-26 VITALS — BP 93/67 | HR 60 | Ht 70.0 in | Wt 220.0 lb

## 2015-07-26 DIAGNOSIS — I472 Ventricular tachycardia, unspecified: Secondary | ICD-10-CM

## 2015-07-26 DIAGNOSIS — I480 Paroxysmal atrial fibrillation: Secondary | ICD-10-CM | POA: Diagnosis not present

## 2015-07-26 DIAGNOSIS — I2510 Atherosclerotic heart disease of native coronary artery without angina pectoris: Secondary | ICD-10-CM

## 2015-07-26 DIAGNOSIS — I10 Essential (primary) hypertension: Secondary | ICD-10-CM

## 2015-07-26 DIAGNOSIS — E785 Hyperlipidemia, unspecified: Secondary | ICD-10-CM | POA: Diagnosis not present

## 2015-07-26 DIAGNOSIS — I255 Ischemic cardiomyopathy: Secondary | ICD-10-CM

## 2015-07-26 NOTE — Patient Instructions (Signed)
Your physician wants you to follow-up in: 3 months with Dr. Stanford Breed. You will receive a reminder letter in the mail two months in advance. If you don't receive a letter, please call our office to schedule the follow-up appointment.   If you need a refill on your cardiac medications before your next appointment, please call your pharmacy.

## 2015-07-26 NOTE — Telephone Encounter (Signed)
Dr. Silverio Decamp and Shirlean Mylar,  This patient was seen by cardiology today 07/26/15 but no mention of clearance for procedure or information on holding Xarelto. Please advise.

## 2015-07-27 ENCOUNTER — Telehealth: Payer: Self-pay | Admitting: Gastroenterology

## 2015-07-27 DIAGNOSIS — D045 Carcinoma in situ of skin of trunk: Secondary | ICD-10-CM | POA: Diagnosis not present

## 2015-07-27 DIAGNOSIS — L821 Other seborrheic keratosis: Secondary | ICD-10-CM | POA: Diagnosis not present

## 2015-07-27 DIAGNOSIS — L814 Other melanin hyperpigmentation: Secondary | ICD-10-CM | POA: Diagnosis not present

## 2015-07-27 DIAGNOSIS — D225 Melanocytic nevi of trunk: Secondary | ICD-10-CM | POA: Diagnosis not present

## 2015-07-27 DIAGNOSIS — D485 Neoplasm of uncertain behavior of skin: Secondary | ICD-10-CM | POA: Diagnosis not present

## 2015-07-27 DIAGNOSIS — L570 Actinic keratosis: Secondary | ICD-10-CM | POA: Diagnosis not present

## 2015-07-27 DIAGNOSIS — Z85828 Personal history of other malignant neoplasm of skin: Secondary | ICD-10-CM | POA: Diagnosis not present

## 2015-07-27 NOTE — Telephone Encounter (Signed)
We can proceed with colonoscopy at [facility] on 08/18/15

## 2015-07-27 NOTE — Telephone Encounter (Signed)
Ok thanks 

## 2015-07-27 NOTE — Telephone Encounter (Signed)
Patient was already approved to Hold see phone note from FEB 3rd  For Julys procedure

## 2015-07-27 NOTE — Telephone Encounter (Signed)
Please see Phone note from Feb 3rd

## 2015-07-27 NOTE — Telephone Encounter (Signed)
Shirlean Mylar, can you please contact cardiology office to make sure they are aware and if ok to hold Xarelto for 2 days prior to colonoscopy. Thanks

## 2015-07-28 ENCOUNTER — Ambulatory Visit (AMBULATORY_SURGERY_CENTER): Payer: Self-pay | Admitting: *Deleted

## 2015-07-28 VITALS — Ht 70.0 in | Wt 220.0 lb

## 2015-07-28 DIAGNOSIS — Z86010 Personal history of colonic polyps: Secondary | ICD-10-CM

## 2015-07-28 MED ORDER — NA SULFATE-K SULFATE-MG SULF 17.5-3.13-1.6 GM/177ML PO SOLN: 1.0000 | Freq: Once | ORAL | Status: DC

## 2015-07-28 NOTE — Progress Notes (Signed)
No egg or soy allergy. No anesthesia problems.

## 2015-08-10 ENCOUNTER — Encounter: Payer: Medicare Other | Admitting: Gastroenterology

## 2015-08-11 ENCOUNTER — Encounter (HOSPITAL_COMMUNITY): Payer: Self-pay | Admitting: *Deleted

## 2015-08-17 ENCOUNTER — Encounter (HOSPITAL_COMMUNITY): Payer: Self-pay | Admitting: Anesthesiology

## 2015-08-17 NOTE — Anesthesia Preprocedure Evaluation (Addendum)
Anesthesia Evaluation    Reviewed: Allergy & Precautions, NPO status , Patient's Chart, lab work & pertinent test results  Airway Mallampati: III  TM Distance: >3 FB Neck ROM: Full    Dental no notable dental hx.    Pulmonary sleep apnea , former smoker,  History of restrictive lung disease   Pulmonary exam normal        Cardiovascular hypertension, + CAD, + Past MI and +CHF  Normal cardiovascular exam+ dysrhythmias Atrial Fibrillation and Ventricular Tachycardia + Cardiac Defibrillator (BiVICD, )   Echo January 2017 with EF 20-25%, global hypokinesis   Neuro/Psych    GI/Hepatic GERD  ,diverticulosis   Endo/Other  negative endocrine ROSObese  Renal/GU      Musculoskeletal negative musculoskeletal ROS (+)   Abdominal Normal abdominal exam  (+)   Peds  Hematology   Anesthesia Other Findings   Reproductive/Obstetrics                            Anesthesia Physical Anesthesia Plan  ASA: IV  Anesthesia Plan: MAC   Post-op Pain Management:    Induction: Intravenous  Airway Management Planned: Mask  Additional Equipment:   Intra-op Plan:   Post-operative Plan:   Informed Consent:   Plan Discussed with:   Anesthesia Plan Comments:        chart review only Anesthesia Quick Evaluation

## 2015-08-18 ENCOUNTER — Encounter (HOSPITAL_COMMUNITY): Payer: Self-pay

## 2015-08-18 ENCOUNTER — Ambulatory Visit (HOSPITAL_COMMUNITY): Admit: 2015-08-18 | Payer: Self-pay | Admitting: Gastroenterology

## 2015-08-18 ENCOUNTER — Encounter (HOSPITAL_COMMUNITY)
Admission: RE | Disposition: A | Discharge status: Home / Self Care | Payer: Self-pay | Source: Ambulatory Visit | Attending: Gastroenterology

## 2015-08-18 ENCOUNTER — Ambulatory Visit (HOSPITAL_COMMUNITY): Payer: Medicare Other | Admitting: Anesthesiology

## 2015-08-18 ENCOUNTER — Ambulatory Visit (HOSPITAL_COMMUNITY)
Admission: RE | Admit: 2015-08-18 | Discharge: 2015-08-18 | Disposition: A | Discharge status: Home / Self Care | Payer: Medicare Other | Source: Ambulatory Visit | Attending: Gastroenterology | Admitting: Gastroenterology

## 2015-08-18 ENCOUNTER — Encounter: Payer: Self-pay | Admitting: Internal Medicine

## 2015-08-18 DIAGNOSIS — K5909 Other constipation: Secondary | ICD-10-CM | POA: Insufficient documentation

## 2015-08-18 DIAGNOSIS — G4733 Obstructive sleep apnea (adult) (pediatric): Secondary | ICD-10-CM | POA: Diagnosis not present

## 2015-08-18 DIAGNOSIS — K635 Polyp of colon: Secondary | ICD-10-CM | POA: Diagnosis not present

## 2015-08-18 DIAGNOSIS — K648 Other hemorrhoids: Secondary | ICD-10-CM | POA: Diagnosis not present

## 2015-08-18 DIAGNOSIS — Z86010 Personal history of colonic polyps: Secondary | ICD-10-CM | POA: Insufficient documentation

## 2015-08-18 DIAGNOSIS — Z7901 Long term (current) use of anticoagulants: Secondary | ICD-10-CM | POA: Diagnosis not present

## 2015-08-18 DIAGNOSIS — Z1211 Encounter for screening for malignant neoplasm of colon: Secondary | ICD-10-CM | POA: Diagnosis not present

## 2015-08-18 DIAGNOSIS — Z79899 Other long term (current) drug therapy: Secondary | ICD-10-CM | POA: Diagnosis not present

## 2015-08-18 DIAGNOSIS — K760 Fatty (change of) liver, not elsewhere classified: Secondary | ICD-10-CM | POA: Insufficient documentation

## 2015-08-18 DIAGNOSIS — I255 Ischemic cardiomyopathy: Secondary | ICD-10-CM | POA: Insufficient documentation

## 2015-08-18 DIAGNOSIS — J449 Chronic obstructive pulmonary disease, unspecified: Secondary | ICD-10-CM | POA: Diagnosis not present

## 2015-08-18 DIAGNOSIS — Z87891 Personal history of nicotine dependence: Secondary | ICD-10-CM | POA: Insufficient documentation

## 2015-08-18 DIAGNOSIS — I110 Hypertensive heart disease with heart failure: Secondary | ICD-10-CM | POA: Diagnosis not present

## 2015-08-18 DIAGNOSIS — I4891 Unspecified atrial fibrillation: Secondary | ICD-10-CM | POA: Insufficient documentation

## 2015-08-18 DIAGNOSIS — I472 Ventricular tachycardia: Secondary | ICD-10-CM | POA: Diagnosis not present

## 2015-08-18 DIAGNOSIS — Z9861 Coronary angioplasty status: Secondary | ICD-10-CM | POA: Diagnosis not present

## 2015-08-18 DIAGNOSIS — I252 Old myocardial infarction: Secondary | ICD-10-CM | POA: Diagnosis not present

## 2015-08-18 DIAGNOSIS — K5730 Diverticulosis of large intestine without perforation or abscess without bleeding: Secondary | ICD-10-CM | POA: Diagnosis not present

## 2015-08-18 DIAGNOSIS — I2510 Atherosclerotic heart disease of native coronary artery without angina pectoris: Secondary | ICD-10-CM | POA: Insufficient documentation

## 2015-08-18 DIAGNOSIS — Z95810 Presence of automatic (implantable) cardiac defibrillator: Secondary | ICD-10-CM | POA: Insufficient documentation

## 2015-08-18 DIAGNOSIS — K219 Gastro-esophageal reflux disease without esophagitis: Secondary | ICD-10-CM | POA: Diagnosis not present

## 2015-08-18 DIAGNOSIS — I509 Heart failure, unspecified: Secondary | ICD-10-CM | POA: Diagnosis not present

## 2015-08-18 DIAGNOSIS — D124 Benign neoplasm of descending colon: Secondary | ICD-10-CM | POA: Diagnosis not present

## 2015-08-18 DIAGNOSIS — E785 Hyperlipidemia, unspecified: Secondary | ICD-10-CM | POA: Diagnosis not present

## 2015-08-18 DIAGNOSIS — I10 Essential (primary) hypertension: Secondary | ICD-10-CM | POA: Diagnosis not present

## 2015-08-18 DIAGNOSIS — Z08 Encounter for follow-up examination after completed treatment for malignant neoplasm: Secondary | ICD-10-CM

## 2015-08-18 DIAGNOSIS — Z85038 Personal history of other malignant neoplasm of large intestine: Secondary | ICD-10-CM

## 2015-08-18 HISTORY — DX: Squamous cell carcinoma of skin of other part of trunk: C44.529

## 2015-08-18 HISTORY — DX: Presence of cardiac pacemaker: Z95.0

## 2015-08-18 HISTORY — PX: COLONOSCOPY WITH PROPOFOL: SHX5780

## 2015-08-18 HISTORY — DX: Chronic obstructive pulmonary disease, unspecified: J44.9

## 2015-08-18 HISTORY — DX: Presence of automatic (implantable) cardiac defibrillator: Z95.810

## 2015-08-18 SURGERY — COLONOSCOPY WITH PROPOFOL
Anesthesia: Monitor Anesthesia Care

## 2015-08-18 MED ORDER — PROPOFOL 500 MG/50ML IV EMUL
INTRAVENOUS | Status: DC | PRN
  Administered 2015-08-18: 75 ug/kg/min via INTRAVENOUS

## 2015-08-18 MED ORDER — SODIUM CHLORIDE 0.9 % IV SOLN: INTRAVENOUS | Status: DC

## 2015-08-18 MED ORDER — PROPOFOL 10 MG/ML IV BOLUS
INTRAVENOUS | Status: AC
  Filled 2015-08-18: qty 60

## 2015-08-18 MED ORDER — LACTATED RINGERS IV SOLN
INTRAVENOUS | Status: DC
  Administered 2015-08-18: 09:00:00 via INTRAVENOUS

## 2015-08-18 MED ORDER — PROPOFOL 500 MG/50ML IV EMUL
INTRAVENOUS | Status: DC | PRN
  Administered 2015-08-18: 20 mg via INTRAVENOUS

## 2015-08-18 MED ORDER — PHENYLEPHRINE HCL 10 MG/ML IJ SOLN
INTRAMUSCULAR | Status: DC | PRN
  Administered 2015-08-18 (×4): 40 ug via INTRAVENOUS

## 2015-08-18 SURGICAL SUPPLY — 21 items

## 2015-08-18 NOTE — Discharge Instructions (Signed)
Colonoscopy, Care After Refer to this sheet in the next few weeks. These instructions provide you with information on caring for yourself after your procedure. Your health care provider may also give you more specific instructions. Your treatment has been planned according to current medical practices, but problems sometimes occur. Call your health care provider if you have any problems or questions after your procedure. WHAT TO EXPECT AFTER THE PROCEDURE  After your procedure, it is typical to have the following:  A small amount of blood in your stool.  Moderate amounts of gas and mild abdominal cramping or bloating. HOME CARE INSTRUCTIONS  Do not drive, operate machinery, or sign important documents for 24 hours.  You may shower and resume your regular physical activities, but move at a slower pace for the first 24 hours.  Take frequent rest periods for the first 24 hours.  Walk around or put a warm pack on your abdomen to help reduce abdominal cramping and bloating.  Drink enough fluids to keep your urine clear or pale yellow.  You may resume your normal diet as instructed by your health care provider. Avoid heavy or fried foods that are hard to digest.  Avoid drinking alcohol for 24 hours or as instructed by your health care provider.  Only take over-the-counter or prescription medicines as directed by your health care provider.  If a tissue sample (biopsy) was taken during your procedure:  Do not take aspirin or blood thinners for 7 days, or as instructed by your health care provider.  Do not drink alcohol for 7 days, or as instructed by your health care provider.  Eat soft foods for the first 24 hours. SEEK MEDICAL CARE IF: You have persistent spotting of blood in your stool 2-3 days after the procedure. SEEK IMMEDIATE MEDICAL CARE IF:  You have more than a small spotting of blood in your stool.  You pass large blood clots in your stool.  Your abdomen is swollen  (distended).  You have nausea or vomiting.  You have a fever.  You have increasing abdominal pain that is not relieved with medicine.   This information is not intended to replace advice given to you by your health care provider. Make sure you discuss any questions you have with your health care provider.   Document Released: 08/15/2003 Document Revised: 10/21/2012 Document Reviewed: 09/07/2012 Elsevier Interactive Patient Education [employer].

## 2015-08-18 NOTE — H&P (Addendum)
[facility] History and Physical   Primary Care Physician:  [name], MD   Reason for Procedure:  Surveillance  Plan:    Colonoscopy with possible intervention     HPI: [name] is a 75 y.o. male with multiple co morbidities is here for surveillance colonoscopy for h/o colon tubular adenomas. Denies any nausea, vomiting, abdominal pain, melena or bright red blood per rectum  The risks and benefits as well as alternatives of endoscopic procedure(s) have been discussed and reviewed. All questions answered. The patient agrees to proceed.   Past Medical History:  Diagnosis Date  . Adenomatous colon polyp 2009; 06/2012   Colonoscopy 07/2007---Dr. Barron Schmid GI--Repeat 06/2012 showed one tubular adenoma w/out high grade dysplasia.  [name] AICD (automatic cardioverter/defibrillator) present    [employer]  . Atrial fibrillation ([place])   . Bilateral sensorineural hearing loss   . BPH (benign prostatic hypertrophy)    PSAs ok (followed by urologist): no LUTS  . CAD (coronary artery disease)    PTCA RCA 1997 (Inf/post MI);  . CHF (congestive heart failure) ([place])   . Chronic constipation   . Chronic rhinitis    ?vasomotor (Dr. Erik Obey)  . COPD (chronic obstructive pulmonary disease) ([place])   . Diverticulosis of colon 2005   Noted on Colonoscopies in 2005 and 2009 and on CT 2010  . Gallstones 06/2014   No evidence of cholecystitis on abd u/s 06/2014  . GERD (gastroesophageal reflux disease)   . Hepatic steatosis 2010; 2016   Noted on CT abd 2010 and on u/s abd 04/2009 (transaminasemia) and u/s 06/2014.  [name] Hyperlipidemia   . Hypertension   . Ischemic cardiomyopathy   . Microscopic hematuria    Cysto neg 02/2009 except for BPH-likely has microhem from his renal stone dz  . Nephrolithiasis    No distal stones on CT 2010; stable 8 mm RLP stone, 67m LMP stone 10/2013 plain film.  Abd u/s 06/2014: 9 mm nonobstructing lower pole right renal stone.  03/2015 urol: stable  nonobstructing renal calc by CT 12/2014 and KUB 03/2015.  .Marland KitchenObstructive sleep apnea    no cpap uses since weight loss of 50 pounds  . Presence of permanent cardiac pacemaker    [employer] . Squamous cell carcinoma of back   . Ventricular tachycardia ([facility]     Past Surgical History:  Procedure Laterality Date  . ATRIAL ABLATION SURGERY     flutter ablation 2002  . BREAST SURGERY Left 1990   "Tissue removed from breast"  . CARDIAC CATHETERIZATION    . carotid doppler  09/25/12   No signif stenosis  . COLONOSCOPY N/A 07/13/2012   Diverticula ascending colon.  Polyp x 1-recall 5 yrs (Dr. MWatt Climes Procedure: COLONOSCOPY;  Surgeon: MJeryl Columbia MD;  [facility];  Service: Endoscopy;  Laterality: N/A; polypectomy x 1   . ESOPHAGOGASTRODUODENOSCOPY N/A 07/13/2012   Procedure: ESOPHAGOGASTRODUODENOSCOPY (EGD);  Surgeon: MJeryl Columbia MD;  [facility];  Service: Endoscopy;  Laterality: N/A;  NORMAL  . EXTRACORPOREAL SHOCK WAVE LITHOTRIPSY    . ICD     a) Guidant Contak H170- SVirl Axe MD 07/25/06 (second device) b) Medtronic CRT-D  . INGUINAL HERNIA REPAIR Bilateral 1975  . LEAD REVISION Bilateral 12/26/2011   Procedure: LEAD REVISION;  Surgeon: GEvans Lance MD;  [facility];  Service: Cardiovascular;  Laterality: Bilateral;  . PFTs  02/2015   Mod restriction (interstitial), mod diffusion defect (Dr. YAnnamaria Boots  . precancer area  keratsis removed  05/2015    leftf orehead and left arm and right arm and back    Prior to Admission medications   Medication Sig Start Date End Date Taking? Authorizing Provider  loratadine (CLARITIN) 10 MG tablet Take 10 mg by mouth daily.   Yes Historical Provider, MD  XARELTO 20 MG TABS tablet TAKE 1 TABLET(20 MG) BY MOUTH DAILY WITH DINNER 11/15/14  Yes [name], MD  albuterol (VENTOLIN HFA) 108 (90 Base) MCG/ACT inhaler Inhale 1-2 puffs into the lungs every 4 (four) hours as needed for wheezing or shortness of  breath. 03/13/15   [name], MD  amiodarone (PACERONE) 200 MG tablet Take 1 tablet (200 mg total) by mouth daily. 11/18/14   [name], MD  ANUCORT-HC 25 MG suppository PLACE 1 SUPPOSITORY RECTALLY TWICE DAILY Patient not taking: Reported on 08/04/2015 05/05/15   Renee A Kuneff, DO  carvedilol (COREG) 12.5 MG tablet TAKE 1 TABLET BY MOUTH TWICE DAILY WITH A MEAL 03/06/15   [name], MD  cholecalciferol (VITAMIN D) 1000 units tablet Take 1,000 Units by mouth daily.    Historical Provider, MD  eplerenone (INSPRA) 25 MG tablet Take 1 tablet (25 mg total) by mouth daily. 11/30/14   [name], MD  fluticasone furoate-vilanterol (BREO ELLIPTA) 100-25 MCG/INH AEPB Inhale 1 puff into the lungs daily. 04/24/15   Collene Gobble, MD  furosemide (LASIX) 40 MG tablet Take 2 tablets (80 mg total) by mouth daily. 11/17/14   [name], MD  ipratropium (ATROVENT) 0.06 % nasal spray Place 2 sprays into the nose 4 (four) times daily. 03/13/15   [name], MD  levocetirizine (XYZAL) 5 MG tablet TAKE 1 TABLET BY MOUTH EVERY MORNING Patient not taking: Reported on 08/04/2015 02/13/15   [name], MD  Magnesium 250 MG TABS Take 1 tablet by mouth daily.    Historical Provider, MD  mexiletine (MEXITIL) 150 MG capsule Take 2 capsules (300 mg total) by mouth 2 (two) times daily. 10/06/14   [name], MD  Multiple Vitamin (MULTIVITAMIN WITH MINERALS) TABS Take 1 tablet by mouth daily.    Historical Provider, MD  Na Sulfate-K Sulfate-Mg Sulf (SUPREP BOWEL PREP KIT) 17.5-3.13-1.6 GM/180ML SOLN Take 1 kit by mouth once. Name brand only, suprep as directed, no substitutions 07/28/15   Mauri Pole, MD  nitroGLYCERIN (NITROSTAT) 0.4 MG SL tablet Place 1 tablet (0.4 mg total) under the tongue every 5 (five) minutes as needed for chest pain. 03/17/14   [name], MD  omeprazole (PRILOSEC) 20 MG capsule Take 20 mg by mouth daily.    Historical Provider, MD  polyethylene glycol  (MIRALAX / GLYCOLAX) packet Take 17 g by mouth daily.    Historical Provider, MD  polyvinyl alcohol-povidone (REFRESH) 1.4-0.6 % ophthalmic solution Place 1-2 drops into both eyes as needed (dry eyes).     Historical Provider, MD  potassium chloride SA (K-DUR,KLOR-CON) 20 MEQ tablet TAKE 1 TABLET BY MOUTH EVERY DAY 04/27/15   [name], MD  Probiotic Product (PROBIOTIC DAILY PO) Take 1 tablet by mouth daily.    Historical Provider, MD  Psyllium (METAMUCIL PO) Take by mouth 2 (two) times daily. Take 3 teaspoons by mouth 2 times daily    Historical Provider, MD  rosuvastatin (CRESTOR) 10 MG tablet Take 1 tablet (10 mg total) by mouth daily. 07/13/15   [name], MD  sacubitril-valsartan (ENTRESTO) 24-26 MG Take 1 tablet by mouth  2 (two) times daily. 05/11/15   [name], MD    Current Facility-Administered Medications  Medication Dose Route Frequency Provider Last Rate Last Dose  . 0.9 %  sodium chloride infusion   Intravenous Continuous Mauri Pole, MD      . lactated ringers infusion   Intravenous Continuous Mauri Pole, MD        Allergies as of 07/20/2015 - Review Complete 07/10/2015  Allergen Reaction Noted  . Sulfa antibiotics Rash 04/04/2010    Family History  Problem Relation Age of Onset  . Heart disease Mother   . Other Mother     colon surgery for fistula  . Breast cancer Maternal Grandmother   . Coronary artery disease Father     family hx of  . Heart attack Father     4 stents/ pacemaker  . Nephrolithiasis Father   . Hypertension Sister   . Goiter Paternal Grandmother   . Nephrolithiasis Son   . Colon cancer Neg Hx     Social History   Social History  . Marital status: Married    Spouse name: N/A  . Number of children: 3  . Years of education: N/A   Occupational History  . Retired Retired   Social History Main Topics  . Smoking status: Former Smoker    Packs/day: 2.00    Years: 15.00    Types: Cigarettes    Quit date:  01/15/1980  . Smokeless tobacco: Never Used  . Alcohol use No  . Drug use: No  . Sexual activity: Not on file   Other Topics Concern  . Not on file   Social History Narrative   Married.   Hx of cigarettes: quit in the 1980s.  No alc/drugs.    Review of Systems:  All other review of systems negative except as mentioned in the HPI.  Physical Exam: Vital signs in last 24 hours: Pulse Rate:  [60] 60 (08/04 0907) Resp:  [12] 12 (08/04 0907) BP: (135)/(73) 135/73 (08/04 0907) SpO2:  [97 %] 97 % (08/04 0907) Weight:  [220 lb (99.8 kg)] 220 lb (99.8 kg) (08/04 0907)   General:   Alert,  Well-developed, well-nourished, pleasant and cooperative in NAD Lungs:  Clear throughout to auscultation.   Heart:  Regular rate and rhythm; no murmurs, clicks, rubs,  or gallops. Abdomen:  Soft, nontender and nondistended. Normal bowel sounds.   Neuro/Psych:  Alert and cooperative. Normal mood and affect. A and O x 3   _0 .Denzil Magnuson, MD [facility] [phone] (pager) 08/18/2015 9:12 AM@

## 2015-08-18 NOTE — Op Note (Signed)
[facility] Patient Name: [name] Procedure Date: 08/18/2015 MRN: [id number] Attending MD: Mauri Pole , MD Date of Birth: [birth year] CSN: [id number] Age: 75 Admit Type: Outpatient Procedure:                Colonoscopy Indications:              High risk colon cancer surveillance: Personal                            history of colonic polyps, Surveillance: Personal                            history of adenomatous polyps on last colonoscopy 5                            years ago Providers:                Mauri Pole, MD, Cleda Daub, RN, Corliss Parish, Technician Referring MD:              Medicines:                Monitored Anesthesia Care Complications:            No immediate complications. Estimated Blood Loss:     Estimated blood loss was minimal. Procedure:                Pre-Anesthesia Assessment:                           - Prior to the procedure, a History and Physical                            was performed, and patient medications and                            allergies were reviewed. The patient's tolerance of                            previous anesthesia was also reviewed. The risks                            and benefits of the procedure and the sedation                            options and risks were discussed with the patient.                            All questions were answered, and informed consent                            was obtained. Prior Anticoagulants: The patient                            last took Xarelto (  rivaroxaban) 3 days prior to the                            procedure. ASA Grade Assessment: IV - A patient                            with severe systemic disease that is a constant                            threat to life. After reviewing the risks and                            benefits, the patient was deemed in satisfactory                            condition to undergo the  procedure.                           After obtaining informed consent, the colonoscope                            was passed under direct vision. Throughout the                            procedure, the patient's blood pressure, pulse, and                            oxygen saturations were monitored continuously. The                            EC-3890LI ([id number]) scope was introduced through                            the anus and advanced to the the cecum, identified                            by appendiceal orifice and ileocecal valve. The                            colonoscopy was performed without difficulty. The                            patient tolerated the procedure well. The quality                            of the bowel preparation was excellent. The                            ileocecal valve, appendiceal orifice, and rectum                            were photographed. Scope In: 9:32:46 AM Scope Out: 9:45:16 AM Scope Withdrawal Time: 0 hours 7 minutes 6 seconds  Total Procedure Duration: 0 hours  12 minutes 30 seconds  Findings:      The perianal and digital rectal examinations were normal.      A 5 mm polyp was found in the descending colon. The polyp was sessile.       The polyp was removed with a cold snare. Resection and retrieval were       complete.      Multiple small and large-mouthed diverticula were found in the sigmoid       colon.      Non-bleeding internal hemorrhoids were found during retroflexion. The       hemorrhoids were large.      The exam was otherwise without abnormality. Impression:               - One 5 mm polyp in the descending colon, removed                            with a cold snare. Resected and retrieved.                           - Diverticulosis in the sigmoid colon.                           - Non-bleeding internal hemorrhoids.                           - The examination was otherwise normal. Moderate Sedation:      N/A- Per Anesthesia  Care Recommendation:           - Patient has a contact number available for                            emergencies. The signs and symptoms of potential                            delayed complications were discussed with the                            patient. Return to normal activities tomorrow.                            Written discharge instructions were provided to the                            patient.                           - Resume previous diet.                           - Continue present medications.                           - Resume Xarelto (rivaroxaban) at prior dose today.                           - Await pathology results.                           -  Repeat colonoscopy in 5 years for surveillance.                           - Return to GI clinic PRN for hemorrhoidal band                            ligation. Procedure Code(s):        --- Professional ---                           [phone], Colonoscopy, flexible; with removal of                            tumor(s), polyp(s), or other lesion(s) by snare                            technique Diagnosis Code(s):        --- Professional ---                           D12.4, Benign neoplasm of descending colon                           K64.8, Other hemorrhoids                           Z86.010, Personal history of colonic polyps                           K57.30, Diverticulosis of large intestine without                            perforation or abscess without bleeding CPT copyright 2016 American Medical Association. All rights reserved. The codes documented in this report are preliminary and upon coder review may  be revised to meet current compliance requirements. Mauri Pole, MD 08/18/2015 9:53:02 AM This report has been signed electronically. Number of Addenda: 0

## 2015-08-18 NOTE — Anesthesia Postprocedure Evaluation (Signed)
Anesthesia Post Note  Patient: [name]  Procedure(s) Performed: Procedure(s) (LRB): COLONOSCOPY WITH PROPOFOL (N/A)  Patient location during evaluation: PACU Anesthesia Type: MAC Level of consciousness: awake and alert Pain management: pain level controlled Vital Signs Assessment: post-procedure vital signs reviewed and stable Respiratory status: spontaneous breathing, nonlabored ventilation, respiratory function stable and patient connected to nasal cannula oxygen Cardiovascular status: stable and blood pressure returned to baseline Anesthetic complications: no    Last Vitals:  Vitals:   08/18/15 0907 08/18/15 0955  BP: 135/73   Pulse: 60 60  Resp: 12 14  Temp:  36.7 C    Last Pain:  Vitals:   08/18/15 0955  TempSrc: Oral                 Reginal Lutes

## 2015-08-18 NOTE — Transfer of Care (Signed)
Immediate Anesthesia Transfer of Care Note  Patient: [name]  Procedure(s) Performed: Procedure(s): COLONOSCOPY WITH PROPOFOL (N/A)  Patient Location: PACU  Anesthesia Type:MAC  Level of Consciousness:  sedated, patient cooperative and responds to stimulation  Airway & Oxygen Therapy:Patient Spontanous Breathing and Patient connected to face mask oxgen  Post-op Assessment:  Report given to PACU RN and Post -op Vital signs reviewed and stable  Post vital signs:  Reviewed and stable  Last Vitals:  Vitals:   08/18/15 0907  BP: 135/73  Pulse: 60  Resp: 12    Complications: No apparent anesthesia complications

## 2015-08-21 ENCOUNTER — Encounter (HOSPITAL_COMMUNITY): Payer: Self-pay | Admitting: Gastroenterology

## 2015-08-22 ENCOUNTER — Other Ambulatory Visit: Payer: Medicare Other

## 2015-08-23 ENCOUNTER — Other Ambulatory Visit (INDEPENDENT_AMBULATORY_CARE_PROVIDER_SITE_OTHER): Payer: Medicare Other

## 2015-08-23 DIAGNOSIS — E785 Hyperlipidemia, unspecified: Secondary | ICD-10-CM | POA: Diagnosis not present

## 2015-08-23 LAB — HEPATIC FUNCTION PANEL
ALK PHOS: 64 U/L (ref 39–117)
ALT: 40 U/L (ref 0–53)
AST: 31 U/L (ref 0–37)
Albumin: 3.9 g/dL (ref 3.5–5.2)
BILIRUBIN DIRECT: 0.1 mg/dL (ref 0.0–0.3)
BILIRUBIN TOTAL: 0.5 mg/dL (ref 0.2–1.2)
Total Protein: 6.5 g/dL (ref 6.0–8.3)

## 2015-08-24 ENCOUNTER — Ambulatory Visit (INDEPENDENT_AMBULATORY_CARE_PROVIDER_SITE_OTHER): Payer: Medicare Other | Admitting: Family Medicine

## 2015-08-24 ENCOUNTER — Encounter: Payer: Self-pay | Admitting: Family Medicine

## 2015-08-24 ENCOUNTER — Encounter: Payer: Self-pay | Admitting: General Practice

## 2015-08-24 VITALS — BP 100/68 | HR 62 | Temp 97.8°F | Ht 70.0 in | Wt 216.7 lb

## 2015-08-24 DIAGNOSIS — L291 Pruritus scroti: Secondary | ICD-10-CM | POA: Diagnosis not present

## 2015-08-24 DIAGNOSIS — T148 Other injury of unspecified body region: Secondary | ICD-10-CM | POA: Diagnosis not present

## 2015-08-24 DIAGNOSIS — I255 Ischemic cardiomyopathy: Secondary | ICD-10-CM

## 2015-08-24 DIAGNOSIS — E785 Hyperlipidemia, unspecified: Secondary | ICD-10-CM | POA: Diagnosis not present

## 2015-08-24 DIAGNOSIS — W57XXXA Bitten or stung by nonvenomous insect and other nonvenomous arthropods, initial encounter: Secondary | ICD-10-CM | POA: Diagnosis not present

## 2015-08-24 NOTE — Progress Notes (Signed)
Pre visit review using our clinic review tool, if applicable. No additional management support is needed unless otherwise documented below in the visit note.

## 2015-08-24 NOTE — Patient Instructions (Signed)
The liver enzymes you did with your doctor look great!  Use hydrocortisone cream on the insect bites and avoid scratching.  Use the jock itch cream 1-2 times daily for 2 weeks and follow up with your doctor is symptoms persist.

## 2015-08-24 NOTE — Progress Notes (Signed)
HPI:  Acute visit for several issues:  3 itchy bumps: -2 on R arm and one on back -for a few days -scratched and now red on the arm near bump -benadryl cream helped -has cats  Scrotal pruritis -only at night for a few nights -wonders if this is side effect to crestor - wants to get results of liver tests as recently started crestor and is very worried about side effects -denies rash and declines exam  ROS: See pertinent positives and negatives per HPI.  Past Medical History:  Diagnosis Date  . Adenomatous colon polyp 2009; 06/2012   Colonoscopy 07/2007---Dr. Barron Schmid GI--Repeat 06/2012 showed one tubular adenoma w/out high grade dysplasia.  [name] AICD (automatic cardioverter/defibrillator) present    [employer]  . Atrial fibrillation ([place])   . Bilateral sensorineural hearing loss   . BPH (benign prostatic hypertrophy)    PSAs ok (followed by urologist): no LUTS  . CAD (coronary artery disease)    PTCA RCA 1997 (Inf/post MI);  . CHF (congestive heart failure) ([place])   . Chronic constipation   . Chronic rhinitis    ?vasomotor (Dr. Erik Obey)  . COPD (chronic obstructive pulmonary disease) ([place])   . Diverticulosis of colon 2005   Noted on Colonoscopies in 2005 and 2009 and on CT 2010  . Gallstones 06/2014   No evidence of cholecystitis on abd u/s 06/2014  . GERD (gastroesophageal reflux disease)   . Hepatic steatosis 2010; 2016   Noted on CT abd 2010 and on u/s abd 04/2009 (transaminasemia) and u/s 06/2014.  [name] Hyperlipidemia   . Hypertension   . Ischemic cardiomyopathy   . Microscopic hematuria    Cysto neg 02/2009 except for BPH-likely has microhem from his renal stone dz  . Nephrolithiasis    No distal stones on CT 2010; stable 8 mm RLP stone, 69m LMP stone 10/2013 plain film.  Abd u/s 06/2014: 9 mm nonobstructing lower pole right renal stone.  03/2015 urol: stable nonobstructing renal calc by CT 12/2014 and KUB 03/2015.  .Marland KitchenObstructive sleep apnea    no cpap uses since  weight loss of 50 pounds  . Presence of permanent cardiac pacemaker    [employer] . Squamous cell carcinoma of back   . Ventricular tachycardia ([facility]     Past Surgical History:  Procedure Laterality Date  . ATRIAL ABLATION SURGERY     flutter ablation 2002  . BREAST SURGERY Left 1990   "Tissue removed from breast"  . CARDIAC CATHETERIZATION    . carotid doppler  09/25/12   No signif stenosis  . COLONOSCOPY N/A 07/13/2012   Diverticula ascending colon.  Polyp x 1-recall 5 yrs (Dr. MWatt Climes Procedure: COLONOSCOPY;  Surgeon: MJeryl Columbia MD;  Location: [facility];  Service: Endoscopy;  Laterality: N/A; polypectomy x 1   . COLONOSCOPY WITH PROPOFOL N/A 08/18/2015   Procedure: COLONOSCOPY WITH PROPOFOL;  Surgeon: KMauri Pole MD;  Location: [facility];  Service: Endoscopy;  Laterality: N/A;  . ESOPHAGOGASTRODUODENOSCOPY N/A 07/13/2012   Procedure: ESOPHAGOGASTRODUODENOSCOPY (EGD);  Surgeon: MJeryl Columbia MD;  [facility];  Service: Endoscopy;  Laterality: N/A;  NORMAL  . EXTRACORPOREAL SHOCK WAVE LITHOTRIPSY    . ICD     a) Guidant Contak H170- SVirl Axe MD 07/25/06 (second device) b) Medtronic CRT-D  . INGUINAL HERNIA REPAIR Bilateral 1975  . LEAD REVISION Bilateral 12/26/2011   Procedure: LEAD REVISION;  Surgeon: GEvans Lance MD;  [facility];  Service: Cardiovascular;  Laterality: Bilateral;  . PFTs  02/2015   Mod restriction (interstitial), mod diffusion defect (Dr. Annamaria Boots)  . precancer area keratsis removed  05/2015    leftf [place] and left arm and right arm and back    Family History  Problem Relation Age of Onset  . Heart disease Mother   . Other Mother     colon surgery for fistula  . Breast cancer Maternal Grandmother   . Coronary artery disease Father     family hx of  . Heart attack Father     4 stents/ pacemaker  . Nephrolithiasis Father   . Hypertension Sister   . Goiter Paternal Grandmother   . Nephrolithiasis Son   .  Colon cancer Neg Hx     Social History   Social History  . Marital status: Married    Spouse name: N/A  . Number of children: 3  . Years of education: N/A   Occupational History  . Retired Retired   Social History Main Topics  . Smoking status: Former Smoker    Packs/day: 2.00    Years: 15.00    Types: Cigarettes    Quit date: 01/15/1980  . Smokeless tobacco: Never Used  . Alcohol use No  . Drug use: No  . Sexual activity: Not Asked   Other Topics Concern  . None   Social History Narrative   Married.   Hx of cigarettes: quit in the 1980s.  No alc/drugs.     Current Outpatient Prescriptions:  .  albuterol (VENTOLIN HFA) 108 (90 Base) MCG/ACT inhaler, Inhale 1-2 puffs into the lungs every 4 (four) hours as needed for wheezing or shortness of breath., Disp: 1 Inhaler, Rfl: 2 .  amiodarone (PACERONE) 200 MG tablet, Take 1 tablet (200 mg total) by mouth daily., Disp: 90 tablet, Rfl: 3 .  carvedilol (COREG) 12.5 MG tablet, TAKE 1 TABLET BY MOUTH TWICE DAILY WITH A MEAL, Disp: 180 tablet, Rfl: 1 .  cholecalciferol (VITAMIN D) 1000 units tablet, Take 1,000 Units by mouth daily., Disp: , Rfl:  .  eplerenone (INSPRA) 25 MG tablet, Take 1 tablet (25 mg total) by mouth daily., Disp: 90 tablet, Rfl: 3 .  fluticasone furoate-vilanterol (BREO ELLIPTA) 100-25 MCG/INH AEPB, Inhale 1 puff into the lungs daily., Disp: 60 each, Rfl: 5 .  furosemide (LASIX) 40 MG tablet, Take 2 tablets (80 mg total) by mouth daily., Disp: 180 tablet, Rfl: 3 .  ipratropium (ATROVENT) 0.06 % nasal spray, Place 2 sprays into the nose 4 (four) times daily., Disp: 15 mL, Rfl: 6 .  loratadine (CLARITIN) 10 MG tablet, Take 10 mg by mouth daily., Disp: , Rfl:  .  Magnesium 250 MG TABS, Take 1 tablet by mouth daily., Disp: , Rfl:  .  mexiletine (MEXITIL) 150 MG capsule, Take 2 capsules (300 mg total) by mouth 2 (two) times daily., Disp: 360 capsule, Rfl: 3 .  Multiple Vitamin (MULTIVITAMIN WITH MINERALS) TABS, Take 1  tablet by mouth daily., Disp: , Rfl:  .  nitroGLYCERIN (NITROSTAT) 0.4 MG SL tablet, Place 1 tablet (0.4 mg total) under the tongue every 5 (five) minutes as needed for chest pain., Disp: 25 tablet, Rfl: 3 .  omeprazole (PRILOSEC) 20 MG capsule, Take 20 mg by mouth daily., Disp: , Rfl:  .  polyethylene glycol (MIRALAX / GLYCOLAX) packet, Take 17 g by mouth daily., Disp: , Rfl:  .  polyvinyl alcohol-povidone (REFRESH) 1.4-0.6 % ophthalmic solution, Place 1-2 drops into both eyes as needed (dry eyes). ,  Disp: , Rfl:  .  potassium chloride SA (K-DUR,KLOR-CON) 20 MEQ tablet, TAKE 1 TABLET BY MOUTH EVERY DAY, Disp: 90 tablet, Rfl: 1 .  Probiotic Product (PROBIOTIC DAILY PO), Take 1 tablet by mouth daily., Disp: , Rfl:  .  Psyllium (METAMUCIL PO), Take by mouth 2 (two) times daily. Take 3 teaspoons by mouth 2 times daily, Disp: , Rfl:  .  rosuvastatin (CRESTOR) 10 MG tablet, Take 1 tablet (10 mg total) by mouth daily., Disp: 90 tablet, Rfl: 1 .  sacubitril-valsartan (ENTRESTO) 24-26 MG, Take 1 tablet by mouth 2 (two) times daily., Disp: 60 tablet, Rfl: 6 .  XARELTO 20 MG TABS tablet, TAKE 1 TABLET(20 MG) BY MOUTH DAILY WITH DINNER, Disp: 30 tablet, Rfl: 11  EXAM:  Vitals:   08/24/15 1622  BP: 100/68  Pulse: 62  Temp: 97.8 F (36.6 C)    Body mass index is 31.09 kg/m.  GENERAL: vitals reviewed and listed above, alert, oriented, appears well hydrated and in no acute distress  HEENT: atraumatic, conjunttiva clear, no obvious abnormalities on inspection of external nose and ears  NECK: no obvious masses on inspection  LUNGS: clear to auscultation bilaterally, no wheezes, rales or rhonchi, good air movement  CV: HRRR, no peripheral edema  MS: moves all extremities without noticeable abnormality  PSYCH: pleasant and cooperative, no obvious depression or anxiety  ASSESSMENT AND PLAN:  Discussed the following assessment and plan:  Insect bites  Scrotal  pruritus  Hyperlipemia  -suspect insect bites - advised to inspect for fleas, avoid scratching, use bug repellent when outdoors and benadryl and hydrocortisone cream for itch as needed -query mild jockitch - he declined exam but agreed to see PCP if persists depite tx with OTC options -liver enzymes ok and advised doubt statin reaction and provided reassurance -Patient advised to return or notify a doctor immediately if symptoms worsen or persist or new concerns arise.  Patient Instructions  The liver enzymes you did with your doctor look great!  Use hydrocortisone cream on the insect bites and avoid scratching.  Use the jock itch cream 1-2 times daily for 2 weeks and follow up with your doctor is symptoms persist.   Colin Benton R., DO

## 2015-09-10 ENCOUNTER — Other Ambulatory Visit: Payer: Self-pay | Admitting: Cardiology

## 2015-09-11 NOTE — Telephone Encounter (Signed)
Rx request sent to pharmacy.  

## 2015-09-19 DIAGNOSIS — D045 Carcinoma in situ of skin of trunk: Secondary | ICD-10-CM | POA: Diagnosis not present

## 2015-09-19 DIAGNOSIS — L570 Actinic keratosis: Secondary | ICD-10-CM | POA: Diagnosis not present

## 2015-09-20 ENCOUNTER — Ambulatory Visit (INDEPENDENT_AMBULATORY_CARE_PROVIDER_SITE_OTHER)
Admission: RE | Admit: 2015-09-20 | Discharge: 2015-09-20 | Disposition: A | Discharge status: Home / Self Care | Payer: Medicare Other | Source: Ambulatory Visit | Attending: Emergency Medicine | Admitting: Emergency Medicine

## 2015-09-20 DIAGNOSIS — R911 Solitary pulmonary nodule: Secondary | ICD-10-CM

## 2015-09-21 ENCOUNTER — Ambulatory Visit (INDEPENDENT_AMBULATORY_CARE_PROVIDER_SITE_OTHER): Payer: Medicare Other | Admitting: *Deleted

## 2015-09-21 DIAGNOSIS — I472 Ventricular tachycardia, unspecified: Secondary | ICD-10-CM

## 2015-09-21 NOTE — Progress Notes (Signed)
Remote ICD transmission.   

## 2015-09-25 ENCOUNTER — Encounter: Payer: Self-pay | Admitting: Cardiology

## 2015-09-26 DIAGNOSIS — Z23 Encounter for immunization: Secondary | ICD-10-CM | POA: Diagnosis not present

## 2015-10-02 LAB — CUP PACEART REMOTE DEVICE CHECK
Battery Remaining Longevity: 54 mo
Battery Remaining Percentage: 82 %
Brady Statistic RA Percent Paced: 100 %
HighPow Impedance: 53 Ohm
Implantable Lead Implant Date: 20020527
Implantable Lead Location: 753860
Implantable Lead Model: 148
Implantable Lead Model: 4194
Implantable Lead Serial Number: 120244
Lead Channel Impedance Value: 597 Ohm
Lead Channel Impedance Value: 728 Ohm
Lead Channel Pacing Threshold Amplitude: 1.3 V
Lead Channel Pacing Threshold Pulse Width: 0.7 ms
Lead Channel Setting Pacing Amplitude: 2 V
Lead Channel Setting Pacing Amplitude: 2.5 V
Lead Channel Setting Pacing Pulse Width: 0.4 ms
Lead Channel Setting Sensing Sensitivity: 0.5 mV
Lead Channel Setting Sensing Sensitivity: 1 mV
MDC IDC LEAD IMPLANT DT: 20020527
MDC IDC LEAD IMPLANT DT: 20131212
MDC IDC LEAD LOCATION: 753858
MDC IDC LEAD LOCATION: 753859
MDC IDC MSMT LEADCHNL LV IMPEDANCE VALUE: 876 Ohm
MDC IDC MSMT LEADCHNL RA PACING THRESHOLD AMPLITUDE: 0.7 V
MDC IDC MSMT LEADCHNL RA PACING THRESHOLD PULSEWIDTH: 0.3 ms
MDC IDC MSMT LEADCHNL RV PACING THRESHOLD AMPLITUDE: 1.2 V
MDC IDC MSMT LEADCHNL RV PACING THRESHOLD PULSEWIDTH: 0.4 ms
MDC IDC SESS DTM: 20170907070100
MDC IDC SET LEADCHNL LV PACING PULSEWIDTH: 0.7 ms
MDC IDC SET LEADCHNL RV PACING AMPLITUDE: 2.5 V
MDC IDC STAT BRADY RV PERCENT PACED: 100 %
Pulse Gen Serial Number: 492371

## 2015-10-03 ENCOUNTER — Other Ambulatory Visit: Payer: Self-pay | Admitting: *Deleted

## 2015-10-03 DIAGNOSIS — I4891 Unspecified atrial fibrillation: Secondary | ICD-10-CM

## 2015-10-03 DIAGNOSIS — Z95810 Presence of automatic (implantable) cardiac defibrillator: Secondary | ICD-10-CM

## 2015-10-03 DIAGNOSIS — I059 Rheumatic mitral valve disease, unspecified: Secondary | ICD-10-CM

## 2015-10-03 DIAGNOSIS — I480 Paroxysmal atrial fibrillation: Secondary | ICD-10-CM

## 2015-10-03 DIAGNOSIS — I429 Cardiomyopathy, unspecified: Secondary | ICD-10-CM

## 2015-10-04 MED ORDER — MEXILETINE HCL 150 MG PO CAPS: 300.0000 mg | ORAL_CAPSULE | Freq: Two times a day (BID) | ORAL | 3 refills | Status: DC

## 2015-10-04 NOTE — Telephone Encounter (Signed)
Rx(s) sent to pharmacy electronically.

## 2015-10-11 ENCOUNTER — Telehealth: Payer: Self-pay | Admitting: Emergency Medicine

## 2015-10-11 NOTE — Telephone Encounter (Addendum)
Called spoke with pt. He reports his dentists told him he had "fungus" on his palate. Pt was given clotrimazole. Pt is requesting an alternative inhaler to his breo. Pt reports he rinses and gargle after each inhaler use. Pt reports he has used symbicort in the past but was a little more expensive. Pt reports he feels his breathing is doing better.  Please advise RB thanks  --pt okay with call back tomorrow.

## 2015-10-11 NOTE — Telephone Encounter (Signed)
lmtcb

## 2015-10-11 NOTE — Telephone Encounter (Signed)
It is ok with me to change back to Symbicort if he believes that he will tolerate this better.

## 2015-10-12 MED ORDER — BUDESONIDE-FORMOTEROL FUMARATE 160-4.5 MCG/ACT IN AERO: 2.0000 | INHALATION_SPRAY | Freq: Two times a day (BID) | RESPIRATORY_TRACT | 5 refills | Status: DC

## 2015-10-12 NOTE — Telephone Encounter (Signed)
Called spoke with pt. Reviewed RB's recs and verified pharmacy as [facility] in [place]. He voiced understanding and had no further questions.

## 2015-10-19 ENCOUNTER — Telehealth: Payer: Self-pay | Admitting: Emergency Medicine

## 2015-10-19 NOTE — Telephone Encounter (Signed)
Spoke with [place] at [facility]. Informed that pt has tried Breo prior to this.  Symbicort approved through 01-14-16.

## 2015-10-25 NOTE — Progress Notes (Signed)
HPI: FU history of coronary artery disease, mixed ischemic and nonischemic cardiomyopathy, history of ICD, paroxysmal atrial fibrillation, and ventricular tachycardia. Previous abdominal ultrasound in April 2006 showed no aneurysm. Last cardiac catheterization in October 2008 showed an occluded right coronary artery with left-to-right collateralization. Ejection fraction is 20%. There was no other coronary disease noted. Last Myoview in Dec 2011 showed prior inferior infarct but no ischemia. His ejection fraction was 19%. ABIs in December of 2011 were normal. Previous carotid Dopplers in Sept 2014 showed 0-39% stenosis bilaterally.The patient has had upgrade of his device to CRT-D. Patient had revision of his left ventricular lead in December 2013. Echocardiogram January 2017 showed ejection fraction 86-57%, grade 1 diastolic dysfunction, mild mitral regurgitation, severe left atrial enlargement mild right sided enlargement and mild tricuspid regurgitation. Chest CT Sept 2017 showed interstitial lung disease. Nodules noted in right lower lobe and follow-up recommended 3-6 months. Patient's pulmonary disease is being followed by Dr. Lamonte Sakai. Since last seen, patient denies dyspnea on exertion, orthopnea, PND, pedal edema, chest pain or syncope.  Current Outpatient Prescriptions  Medication Sig Dispense Refill  . albuterol (VENTOLIN HFA) 108 (90 Base) MCG/ACT inhaler Inhale 1-2 puffs into the lungs every 4 (four) hours as needed for wheezing or shortness of breath. 1 Inhaler 2  . amiodarone (PACERONE) 200 MG tablet Take 1 tablet (200 mg total) by mouth daily. 90 tablet 3  . budesonide-formoterol (SYMBICORT) 160-4.5 MCG/ACT inhaler Inhale 2 puffs into the lungs 2 (two) times daily. 1 Inhaler 5  . carvedilol (COREG) 12.5 MG tablet TAKE 1 TABLET BY MOUTH TWICE DAILY WITH A MEAL 180 tablet 3  . cholecalciferol (VITAMIN D) 1000 units tablet Take 1,000 Units by mouth daily.    [name] eplerenone (INSPRA) 25 MG  tablet Take 1 tablet (25 mg total) by mouth daily. 90 tablet 3  . furosemide (LASIX) 40 MG tablet Take 2 tablets (80 mg total) by mouth daily. 180 tablet 3  . ipratropium (ATROVENT) 0.06 % nasal spray Place 2 sprays into the nose 4 (four) times daily. 15 mL 6  . loratadine (CLARITIN) 10 MG tablet Take 10 mg by mouth daily.    . Magnesium 250 MG TABS Take 1 tablet by mouth daily.    [name] mexiletine (MEXITIL) 150 MG capsule Take 2 capsules (300 mg total) by mouth 2 (two) times daily. 360 capsule 3  . Multiple Vitamin (MULTIVITAMIN WITH MINERALS) TABS Take 1 tablet by mouth daily.    . nitroGLYCERIN (NITROSTAT) 0.4 MG SL tablet Place 1 tablet (0.4 mg total) under the tongue every 5 (five) minutes as needed for chest pain. 25 tablet 3  . omeprazole (PRILOSEC) 20 MG capsule Take 20 mg by mouth daily.    . polyethylene glycol (MIRALAX / GLYCOLAX) packet Take 17 g by mouth daily.    . polyvinyl alcohol-povidone (REFRESH) 1.4-0.6 % ophthalmic solution Place 1-2 drops into both eyes as needed (dry eyes).     . potassium chloride SA (K-DUR,KLOR-CON) 20 MEQ tablet TAKE 1 TABLET BY MOUTH EVERY DAY 90 tablet 1  . Probiotic Product (PROBIOTIC DAILY PO) Take 1 tablet by mouth daily.    . Psyllium (METAMUCIL PO) Take by mouth 2 (two) times daily. Take 3 teaspoons by mouth 2 times daily    . sacubitril-valsartan (ENTRESTO) 24-26 MG Take 1 tablet by mouth 2 (two) times daily. 60 tablet 6  . XARELTO 20 MG TABS tablet TAKE 1 TABLET(20 MG) BY MOUTH DAILY WITH DINNER 30 tablet  11   No current facility-administered medications for this visit.      Past Medical History:  Diagnosis Date  . Adenomatous colon polyp 2009; 06/2012   Colonoscopy 07/2007---Dr. Barron Schmid GI--Repeat 06/2012 showed one tubular adenoma w/out high grade dysplasia.  [name] AICD (automatic cardioverter/defibrillator) present    [employer]  . Atrial fibrillation ([place])   . Bilateral sensorineural hearing loss   . BPH (benign prostatic  hypertrophy)    PSAs ok (followed by urologist): no LUTS  . CAD (coronary artery disease)    PTCA RCA 1997 (Inf/post MI);  . CHF (congestive heart failure) ([place])   . Chronic constipation   . Chronic rhinitis    ?vasomotor (Dr. Erik Obey)  . COPD (chronic obstructive pulmonary disease) ([place])   . Diverticulosis of colon 2005   Noted on Colonoscopies in 2005 and 2009 and on CT 2010  . Gallstones 06/2014   No evidence of cholecystitis on abd u/s 06/2014  . GERD (gastroesophageal reflux disease)   . Hepatic steatosis 2010; 2016   Noted on CT abd 2010 and on u/s abd 04/2009 (transaminasemia) and u/s 06/2014.  [name] Hyperlipidemia   . Hypertension   . Ischemic cardiomyopathy   . Microscopic hematuria    Cysto neg 02/2009 except for BPH-likely has microhem from his renal stone dz  . Nephrolithiasis    No distal stones on CT 2010; stable 8 mm RLP stone, 47m LMP stone 10/2013 plain film.  Abd u/s 06/2014: 9 mm nonobstructing lower pole right renal stone.  03/2015 urol: stable nonobstructing renal calc by CT 12/2014 and KUB 03/2015.  .Marland KitchenObstructive sleep apnea    no cpap uses since weight loss of 50 pounds  . Presence of permanent cardiac pacemaker    [employer] . Squamous cell carcinoma of back   . Ventricular tachycardia ([facility]     Past Surgical History:  Procedure Laterality Date  . ATRIAL ABLATION SURGERY     flutter ablation 2002  . BREAST SURGERY Left 1990   "Tissue removed from breast"  . CARDIAC CATHETERIZATION    . carotid doppler  09/25/12   No signif stenosis  . COLONOSCOPY N/A 07/13/2012   Diverticula ascending colon.  Polyp x 1-recall 5 yrs (Dr. MWatt Climes Procedure: COLONOSCOPY;  Surgeon: MJeryl Columbia MD;  Location: [facility];  Service: Endoscopy;  Laterality: N/A; polypectomy x 1   . COLONOSCOPY WITH PROPOFOL N/A 08/18/2015   Procedure: COLONOSCOPY WITH PROPOFOL;  Surgeon: KMauri Pole MD;  Location: [facility];  Service: Endoscopy;  Laterality: N/A;  .  ESOPHAGOGASTRODUODENOSCOPY N/A 07/13/2012   Procedure: ESOPHAGOGASTRODUODENOSCOPY (EGD);  Surgeon: MJeryl Columbia MD;  [facility];  Service: Endoscopy;  Laterality: N/A;  NORMAL  . EXTRACORPOREAL SHOCK WAVE LITHOTRIPSY    . ICD     a) Guidant Contak H170- SVirl Axe MD 07/25/06 (second device) b) Medtronic CRT-D  . INGUINAL HERNIA REPAIR Bilateral 1975  . LEAD REVISION Bilateral 12/26/2011   Procedure: LEAD REVISION;  Surgeon: GEvans Lance MD;  [facility];  Service: Cardiovascular;  Laterality: Bilateral;  . PFTs  02/2015   Mod restriction (interstitial), mod diffusion defect (Dr. YAnnamaria Boots  . precancer area keratsis removed  05/2015    leftf [place] and left arm and right arm and back    Social History   Social History  . Marital status: Married    Spouse name: N/A  . Number of children: 3  . Years of education: N/A   Occupational  History  . Retired Retired   Social History Main Topics  . Smoking status: Former Smoker    Packs/day: 2.00    Years: 15.00    Types: Cigarettes    Quit date: 01/15/1980  . Smokeless tobacco: Never Used  . Alcohol use No  . Drug use: No  . Sexual activity: Not on file   Other Topics Concern  . Not on file   Social History Narrative   Married.   Hx of cigarettes: quit in the 1980s.  No alc/drugs.    Family History  Problem Relation Age of Onset  . Heart disease Mother   . Other Mother     colon surgery for fistula  . Breast cancer Maternal Grandmother   . Coronary artery disease Father     family hx of  . Heart attack Father     4 stents/ pacemaker  . Nephrolithiasis Father   . Hypertension Sister   . Goiter Paternal Grandmother   . Nephrolithiasis Son   . Colon cancer Neg Hx     ROS: no fevers or chills, productive cough, hemoptysis, dysphasia, odynophagia, melena, hematochezia, dysuria, hematuria, rash, seizure activity, orthopnea, PND, pedal edema, claudication. Remaining systems are negative.  Physical  Exam: Well-developed well-nourished in no acute distress.  Skin is warm and dry.  HEENT is normal.  Neck is supple.  Chest is clear to auscultation with normal expansion.  Cardiovascular exam is regular rate and rhythm.  Abdominal exam nontender or distended. No masses palpated. Extremities show no edema. neuro grossly intact  A/P  1 Ventricular tachycardia-continue beta blocker, amiodarone and mexiletine. Pulmonary is now following CT scans for his lung nodule and lung disease. We will await their recommendations concerning long-term amiodarone.  2 chronic systolic congestive heart failure-he is doing well with no evidence of volume excess on exam. Continue present dose of diuretics. Check potassium and renal function.  3 hyperlipidemia he did not tolerate Crestor.  4 hypertension-blood pressure controlled. Continue present medications.  5 ischemic cardiomyopathy-continue beta blocker and entresto.  6 coronary artery disease-continue present medications. No aspirin given need for anticoagulation.  7 prior biventricular ICD-followed by electrophysiology  8 paroxysmal atrial fibrillation-continue beta blocker, amiodarone and xarelto. Check hemoglobin, renal function and TSH.    Kirk Ruths, MD

## 2015-10-26 DIAGNOSIS — D0439 Carcinoma in situ of skin of other parts of face: Secondary | ICD-10-CM | POA: Diagnosis not present

## 2015-10-26 DIAGNOSIS — D485 Neoplasm of uncertain behavior of skin: Secondary | ICD-10-CM | POA: Diagnosis not present

## 2015-10-27 ENCOUNTER — Encounter: Payer: Self-pay | Admitting: Cardiology

## 2015-10-27 ENCOUNTER — Ambulatory Visit (INDEPENDENT_AMBULATORY_CARE_PROVIDER_SITE_OTHER): Payer: Medicare Other | Admitting: Cardiology

## 2015-10-27 VITALS — BP 104/68 | HR 65 | Ht 70.0 in | Wt 221.0 lb

## 2015-10-27 DIAGNOSIS — I10 Essential (primary) hypertension: Secondary | ICD-10-CM

## 2015-10-27 DIAGNOSIS — I4891 Unspecified atrial fibrillation: Secondary | ICD-10-CM

## 2015-10-27 DIAGNOSIS — I2510 Atherosclerotic heart disease of native coronary artery without angina pectoris: Secondary | ICD-10-CM | POA: Diagnosis not present

## 2015-10-27 DIAGNOSIS — E7849 Other hyperlipidemia: Secondary | ICD-10-CM

## 2015-10-27 DIAGNOSIS — I255 Ischemic cardiomyopathy: Secondary | ICD-10-CM

## 2015-10-27 DIAGNOSIS — I5022 Chronic systolic (congestive) heart failure: Secondary | ICD-10-CM | POA: Diagnosis not present

## 2015-10-27 DIAGNOSIS — E784 Other hyperlipidemia: Secondary | ICD-10-CM

## 2015-10-27 NOTE — Patient Instructions (Signed)
Medication Instructions:   NO CHANGE  Labwork:  Your physician recommends that you return for lab work AT [place]:  Your physician wants you to follow-up in: [place] will receive a reminder letter in the mail two months in advance. If you don't receive a letter, please call our office to schedule the follow-up appointment.   If you need a refill on your cardiac medications before your next appointment, please call your pharmacy.

## 2015-10-31 ENCOUNTER — Other Ambulatory Visit: Payer: Medicare Other | Admitting: *Deleted

## 2015-10-31 DIAGNOSIS — I4891 Unspecified atrial fibrillation: Secondary | ICD-10-CM

## 2015-10-31 LAB — CBC
HEMATOCRIT: 45.2 % (ref 38.5–50.0)
HEMOGLOBIN: 15.2 g/dL (ref 13.2–17.1)
MCH: 34.5 pg — AB (ref 27.0–33.0)
MCHC: 33.6 g/dL (ref 32.0–36.0)
MCV: 102.5 fL — AB (ref 80.0–100.0)
MPV: 9.7 fL (ref 7.5–12.5)
PLATELETS: 316 10*3/uL (ref 140–400)
RBC: 4.41 MIL/uL (ref 4.20–5.80)
RDW: 13.7 % (ref 11.0–15.0)
WBC: 10.2 10*3/uL (ref 3.8–10.8)

## 2015-10-31 LAB — BASIC METABOLIC PANEL
BUN: 16 mg/dL (ref 7–25)
CALCIUM: 8.9 mg/dL (ref 8.6–10.3)
CHLORIDE: 103 mmol/L (ref 98–110)
CO2: 28 mmol/L (ref 20–31)
CREATININE: 0.9 mg/dL (ref 0.70–1.18)
GLUCOSE: 123 mg/dL — AB (ref 65–99)
Potassium: 4 mmol/L (ref 3.5–5.3)
Sodium: 138 mmol/L (ref 135–146)

## 2015-10-31 LAB — TSH: TSH: 2.57 mIU/L (ref 0.40–4.50)

## 2015-11-01 ENCOUNTER — Other Ambulatory Visit: Payer: Medicare Other

## 2015-11-06 ENCOUNTER — Other Ambulatory Visit: Payer: Self-pay | Admitting: Cardiology

## 2015-11-06 NOTE — Telephone Encounter (Signed)
Rx request sent to pharmacy.  

## 2015-11-07 ENCOUNTER — Other Ambulatory Visit: Payer: Self-pay | Admitting: Cardiology

## 2015-11-14 ENCOUNTER — Encounter: Payer: Self-pay | Admitting: Emergency Medicine

## 2015-11-14 ENCOUNTER — Ambulatory Visit (INDEPENDENT_AMBULATORY_CARE_PROVIDER_SITE_OTHER): Payer: Medicare Other | Admitting: Emergency Medicine

## 2015-11-14 ENCOUNTER — Other Ambulatory Visit: Payer: Medicare Other

## 2015-11-14 VITALS — BP 120/84 | HR 68 | Ht 70.0 in | Wt 219.0 lb

## 2015-11-14 DIAGNOSIS — J849 Interstitial pulmonary disease, unspecified: Secondary | ICD-10-CM

## 2015-11-14 DIAGNOSIS — R911 Solitary pulmonary nodule: Secondary | ICD-10-CM

## 2015-11-14 DIAGNOSIS — R918 Other nonspecific abnormal finding of lung field: Secondary | ICD-10-CM

## 2015-11-14 DIAGNOSIS — I255 Ischemic cardiomyopathy: Secondary | ICD-10-CM

## 2015-11-14 NOTE — Progress Notes (Signed)
Subjective:    Patient ID: [name], male    DOB: Oct 03, 1940, 75 y.o.   MRN: [id number]  HPI 75 year old former smoker with a history of hypertension, atrial fibrillation on amiodarone, coronary disease with ischemic cardiomyopathy with ICD/pacer, formerly followed by Dr Gwenette Greet for obstructive sleep apnea on CPAP 16 cm water. He stopped using CPAP about 2 years ago due to dryness. He has tried multiple masks and humidity, but has been unable to tolerate. He is fatigued, is up at night freq to urinate. He underwent PFT that I have reviewed, shows mild mixed disease, restrictive disease on volumes, decreased diffusion capacity that corects for Va.  He describes cough that is likely related to chronic rhintis, partially treated GERD. He recently changed from flonase to ipratropium which has helped. He still coughs up secretions in the am. He was just started on albuterol - seems to benefit from this.          ROV 05/18/15 -- follow-up visit for history of dyspnea in the setting of hypertension, atrial fibrillation on amiodarone, mixed disease on pulmonary function testing, untreated obstructive sleep apnea. He had not been treated with scheduled bronchodilators but had benefited from albuterol and so we started Symbicort which was then substituted with [employer] for his insurance formulary. He was also stared on omeprazole by Dr Erik Obey, changed fluticasone to ipratropium nasal spray > cough and mucous are better. He also underwent a CT scan of the chest on 04/04/15 that person reviewed. This showed evidence for several small pulmonary nodules with some patchy subpleural reticulation and traction bronchiectasis consistent with possible early UIP.  He starts that his cough and dyspnea are significantly improved on the new meds.   ROV 11/14/15 -- This is a follow-up visit for patient with a history of tobacco use and newly diagnosed COPD. He also has a history of obstructive sleep apnea that has not been treated  due to CPAP intolerance. He has chronic rhinitis and is on ipratropium nasal spray. Also treated with GERD for omeprazole. Both of these interventions have helped his chronic cough. He has some very mild interstitial changes with associated nodularity on CT scan of the chest, question related to amiodarone exposure. Repeat CT scan of his chest was done on 09/20/15 that I have personally reviewed. This shows stable right lower lobe and left lower lobe pulmonary nodules. No new nodules were seen. There was no change in his bilateral interstitial disease. No auto-immune eval to date.     Review of Systems As per HPI   Past Medical History:  Diagnosis Date  . Adenomatous colon polyp 2009; 06/2012   Colonoscopy 07/2007---Dr. Barron Schmid GI--Repeat 06/2012 showed one tubular adenoma w/out high grade dysplasia.  [name] AICD (automatic cardioverter/defibrillator) present    [employer]  . Atrial fibrillation ([place])   . Bilateral sensorineural hearing loss   . BPH (benign prostatic hypertrophy)    PSAs ok (followed by urologist): no LUTS  . CAD (coronary artery disease)    PTCA RCA 1997 (Inf/post MI);  . CHF (congestive heart failure) ([place])   . Chronic constipation   . Chronic rhinitis    ?vasomotor (Dr. Erik Obey)  . COPD (chronic obstructive pulmonary disease) ([employer])   . Diverticulosis of colon 2005   Noted on Colonoscopies in 2005 and 2009 and on CT 2010  . Gallstones 06/2014   No evidence of cholecystitis on abd u/s 06/2014  . GERD (gastroesophageal reflux disease)   . Hepatic steatosis 2010; 2016  Noted on CT abd 2010 and on u/s abd 04/2009 (transaminasemia) and u/s 06/2014.  [name] Hyperlipidemia   . Hypertension   . Ischemic cardiomyopathy   . Microscopic hematuria    Cysto neg 02/2009 except for BPH-likely has microhem from his renal stone dz  . Nephrolithiasis    No distal stones on CT 2010; stable 8 mm RLP stone, 14m LMP stone 10/2013 plain film.  Abd u/s 06/2014: 9 mm nonobstructing lower pole  right renal stone.  03/2015 urol: stable nonobstructing renal calc by CT 12/2014 and KUB 03/2015.  .Marland KitchenObstructive sleep apnea    no cpap uses since weight loss of 50 pounds  . Presence of permanent cardiac pacemaker    [employer] . Squamous cell carcinoma of back   . Ventricular tachycardia (HCC)      Family History  Problem Relation Age of Onset  . Heart disease Mother   . Other Mother     colon surgery for fistula  . Breast cancer Maternal Grandmother   . Coronary artery disease Father     family hx of  . Heart attack Father     4 stents/ pacemaker  . Nephrolithiasis Father   . Hypertension Sister   . Goiter Paternal Grandmother   . Nephrolithiasis Son   . Colon cancer Neg Hx      Social History   Social History  . Marital status: Married    Spouse name: N/A  . Number of children: 3  . Years of education: N/A   Occupational History  . Retired Retired   Social History Main Topics  . Smoking status: Former Smoker    Packs/day: 2.00    Years: 15.00    Types: Cigarettes    Quit date: 01/15/1980  . Smokeless tobacco: Never Used  . Alcohol use No  . Drug use: No  . Sexual activity: Not on file   Other Topics Concern  . Not on file   Social History Narrative   Married.   Hx of cigarettes: quit in the 1980s.  No alc/drugs.     Allergies  Allergen Reactions  . Sulfa Antibiotics Rash  . Crestor [Rosuvastatin]      Outpatient Medications Prior to Visit  Medication Sig Dispense Refill  . albuterol (VENTOLIN HFA) 108 (90 Base) MCG/ACT inhaler Inhale 1-2 puffs into the lungs every 4 (four) hours as needed for wheezing or shortness of breath. 1 Inhaler 2  . amiodarone (PACERONE) 200 MG tablet Take 1 tablet (200 mg total) by mouth daily. 90 tablet 3  . budesonide-formoterol (SYMBICORT) 160-4.5 MCG/ACT inhaler Inhale 2 puffs into the lungs 2 (two) times daily. 1 Inhaler 5  . carvedilol (COREG) 12.5 MG tablet TAKE 1 TABLET BY MOUTH TWICE DAILY WITH A MEAL 180  tablet 3  . cholecalciferol (VITAMIN D) 1000 units tablet Take 1,000 Units by mouth daily.    .Marland Kitcheneplerenone (INSPRA) 25 MG tablet Take 1 tablet (25 mg total) by mouth daily. 90 tablet 3  . furosemide (LASIX) 40 MG tablet TAKE 2 TABLETS(80 MG) BY MOUTH DAILY 180 tablet 1  . ipratropium (ATROVENT) 0.06 % nasal spray Place 2 sprays into the nose 4 (four) times daily. 15 mL 6  . loratadine (CLARITIN) 10 MG tablet Take 10 mg by mouth daily.    . Magnesium 250 MG TABS Take 1 tablet by mouth daily.    .Marland Kitchenmexiletine (MEXITIL) 150 MG capsule Take 2 capsules (300 mg total) by mouth 2 (two) times daily.  360 capsule 3  . Multiple Vitamin (MULTIVITAMIN WITH MINERALS) TABS Take 1 tablet by mouth daily.    . nitroGLYCERIN (NITROSTAT) 0.4 MG SL tablet Place 1 tablet (0.4 mg total) under the tongue every 5 (five) minutes as needed for chest pain. 25 tablet 3  . omeprazole (PRILOSEC) 20 MG capsule Take 20 mg by mouth daily.    . polyethylene glycol (MIRALAX / GLYCOLAX) packet Take 17 g by mouth daily.    . polyvinyl alcohol-povidone (REFRESH) 1.4-0.6 % ophthalmic solution Place 1-2 drops into both eyes as needed (dry eyes).     . potassium chloride SA (K-DUR,KLOR-CON) 20 MEQ tablet TAKE 1 TABLET BY MOUTH EVERY DAY 90 tablet 1  . Probiotic Product (PROBIOTIC DAILY PO) Take 1 tablet by mouth daily.    . Psyllium (METAMUCIL PO) Take by mouth 2 (two) times daily. Take 3 teaspoons by mouth 2 times daily    . sacubitril-valsartan (ENTRESTO) 24-26 MG Take 1 tablet by mouth 2 (two) times daily. 60 tablet 6  . XARELTO 20 MG TABS tablet TAKE 1 TABLET(20 MG) BY MOUTH DAILY WITH DINNER 30 tablet 5   No facility-administered medications prior to visit.          Objective:   Physical Exam Vitals:   11/14/15 1105 11/14/15 1106  BP:  120/84  Pulse:  68  SpO2:  95%  Weight: 219 lb (99.3 kg)   Height: _0  (1.778 m)    Gen: Pleasant, well-nourished, in no distress,  normal affect  ENT: No lesions,  mouth clear,   oropharynx clear, no postnasal drip  Neck: No JVD, no TMG, no carotid bruits  Lungs: No use of accessory muscles, clear without rales or rhonchi  Cardiovascular: RRR, heart sounds normal, no murmur or gallops, no peripheral edema  Musculoskeletal: No deformities, no cyanosis or clubbing  Neuro: alert, non focal  Skin: Warm, no lesions or rashes      Assessment & Plan:  COPD (chronic obstructive pulmonary disease) (HCC) Appears to be tolerating benefiting from Symbicort. We will continue this.  Obstructive sleep apnea Currently on CPAP therapy but he is working to lose weight. It may be that he will not need as we go forward  Interstitial lung disease ([place]) Etiology unclear. Consider possible amiodarone changes especially given the surrounding nodularity. The does not appear to be any active disease. I do not believe we have enough information available to ask him to stop amiodarone which is benefiting him otherwise. Form autoimmune labs. We will repeat a CT scan of the chest in 6 months.  Pulmonary nodules Appears to Be associated with his interstitial disease but certainly he is still at risk for possible lung cancer. We will repeat his CT for interval change in 6 months.  Baltazar Apo, MD, PhD 11/14/2015, 11:35 AM [facility] [phone] or if no answer [phone]

## 2015-11-14 NOTE — Assessment & Plan Note (Signed)
Appears to Be associated with his interstitial disease but certainly he is still at risk for possible lung cancer. We will repeat his CT for interval change in 6 months.

## 2015-11-14 NOTE — Patient Instructions (Signed)
Please continue your Symbicort 2 puffs twice a day.  Restart taking your omeprazole every day Continue ipratropium nasal spray as needed for drainage. It is ok to continue amiodarone for now.  We will perform blood work We will repeat your CT scan of the chest in March 2018.  Follow with Dr Lamonte Sakai in March to review.

## 2015-11-14 NOTE — Assessment & Plan Note (Signed)
Etiology unclear. Consider possible amiodarone changes especially given the surrounding nodularity. The does not appear to be any active disease. I do not believe we have enough information available to ask him to stop amiodarone which is benefiting him otherwise. Form autoimmune labs. We will repeat a CT scan of the chest in 6 months.

## 2015-11-14 NOTE — Assessment & Plan Note (Signed)
Appears to be tolerating benefiting from Symbicort. We will continue this.

## 2015-11-14 NOTE — Assessment & Plan Note (Signed)
Currently on CPAP therapy but he is working to lose weight. It may be that he will not need as we go forward

## 2015-11-15 LAB — RHEUMATOID FACTOR

## 2015-11-15 LAB — ANTI-DNA ANTIBODY, DOUBLE-STRANDED: DS DNA AB: 1 [IU]/mL

## 2015-11-15 LAB — ANTI-SCLERODERMA ANTIBODY: SCLERODERMA (SCL-70) (ENA) ANTIBODY, IGG: NEGATIVE

## 2015-11-15 LAB — ANTI-SMITH ANTIBODY: ENA SM Ab Ser-aCnc: <1

## 2015-11-15 LAB — ANA: ANA: NEGATIVE

## 2015-11-15 LAB — CYCLIC CITRUL PEPTIDE ANTIBODY, IGG

## 2015-11-15 LAB — SJOGREN'S SYNDROME ANTIBODS(SSA + SSB)
SSA (Ro) (ENA) Antibody, IgG: <1
SSB (La) (ENA) Antibody, IgG: <1

## 2015-11-15 LAB — RNP ANTIBODY: Ribonucleic Protein(ENA) Antibody, IgG: <1

## 2015-11-15 LAB — ANTI-JO 1 ANTIBODY, IGG: Anti JO-1: <0.2 AI (ref 0.0–0.9)

## 2015-11-16 LAB — ALDOLASE: Aldolase: 4.1 U/L (ref <=8.1)

## 2015-11-19 ENCOUNTER — Other Ambulatory Visit: Payer: Self-pay | Admitting: Cardiology

## 2015-11-19 DIAGNOSIS — I429 Cardiomyopathy, unspecified: Secondary | ICD-10-CM

## 2015-11-19 DIAGNOSIS — I059 Rheumatic mitral valve disease, unspecified: Secondary | ICD-10-CM

## 2015-11-19 DIAGNOSIS — Z95810 Presence of automatic (implantable) cardiac defibrillator: Secondary | ICD-10-CM

## 2015-11-19 DIAGNOSIS — I4891 Unspecified atrial fibrillation: Secondary | ICD-10-CM

## 2015-11-19 DIAGNOSIS — I480 Paroxysmal atrial fibrillation: Secondary | ICD-10-CM

## 2015-11-20 NOTE — Telephone Encounter (Signed)
Rx request sent to pharmacy.  

## 2015-11-24 DIAGNOSIS — L723 Sebaceous cyst: Secondary | ICD-10-CM | POA: Diagnosis not present

## 2015-11-27 ENCOUNTER — Other Ambulatory Visit: Payer: Self-pay | Admitting: Cardiology

## 2015-11-27 DIAGNOSIS — I480 Paroxysmal atrial fibrillation: Secondary | ICD-10-CM

## 2015-11-27 DIAGNOSIS — I429 Cardiomyopathy, unspecified: Secondary | ICD-10-CM

## 2015-11-27 DIAGNOSIS — Z95810 Presence of automatic (implantable) cardiac defibrillator: Secondary | ICD-10-CM

## 2015-11-27 DIAGNOSIS — I4891 Unspecified atrial fibrillation: Secondary | ICD-10-CM

## 2015-11-27 DIAGNOSIS — I059 Rheumatic mitral valve disease, unspecified: Secondary | ICD-10-CM

## 2015-12-01 ENCOUNTER — Other Ambulatory Visit: Payer: Self-pay

## 2015-12-01 ENCOUNTER — Telehealth: Payer: Self-pay | Admitting: Gastroenterology

## 2015-12-01 MED ORDER — HYDROCORTISONE ACETATE 25 MG RE SUPP: 25.0000 mg | Freq: Two times a day (BID) | RECTAL | 1 refills | Status: DC

## 2015-12-01 NOTE — Telephone Encounter (Signed)
Ok to use Anusol as needed and follow up for hemorrhoidal band ligation

## 2015-12-01 NOTE — Telephone Encounter (Signed)
Patient with hemorrhoids calls with complaints of bright red blood on tissue after bowel movements. No pain or itching. He has made an appointment for the banding. Can he have anusol hc suppositories?

## 2015-12-12 ENCOUNTER — Encounter: Payer: Self-pay | Admitting: Family Medicine

## 2015-12-12 ENCOUNTER — Ambulatory Visit (INDEPENDENT_AMBULATORY_CARE_PROVIDER_SITE_OTHER): Payer: Medicare Other | Admitting: Family Medicine

## 2015-12-12 ENCOUNTER — Ambulatory Visit: Payer: Medicare Other | Admitting: Family Medicine

## 2015-12-12 VITALS — BP 121/80 | HR 63 | Temp 98.1°F | Resp 16 | Ht 70.0 in | Wt 219.2 lb

## 2015-12-12 DIAGNOSIS — Z125 Encounter for screening for malignant neoplasm of prostate: Secondary | ICD-10-CM

## 2015-12-12 DIAGNOSIS — K625 Hemorrhage of anus and rectum: Secondary | ICD-10-CM

## 2015-12-12 DIAGNOSIS — E784 Other hyperlipidemia: Secondary | ICD-10-CM | POA: Diagnosis not present

## 2015-12-12 DIAGNOSIS — E7849 Other hyperlipidemia: Secondary | ICD-10-CM

## 2015-12-12 DIAGNOSIS — I255 Ischemic cardiomyopathy: Secondary | ICD-10-CM

## 2015-12-12 DIAGNOSIS — I10 Essential (primary) hypertension: Secondary | ICD-10-CM | POA: Diagnosis not present

## 2015-12-12 LAB — HEPATIC FUNCTION PANEL
ALBUMIN: 3.9 g/dL (ref 3.5–5.2)
ALT: 31 U/L (ref 0–53)
AST: 27 U/L (ref 0–37)
Alkaline Phosphatase: 65 U/L (ref 39–117)
Bilirubin, Direct: 0.1 mg/dL (ref 0.0–0.3)
TOTAL PROTEIN: 6.5 g/dL (ref 6.0–8.3)
Total Bilirubin: 0.6 mg/dL (ref 0.2–1.2)

## 2015-12-12 LAB — PSA, MEDICARE: PSA: 1.59 ng/ml (ref 0.10–4.00)

## 2015-12-12 LAB — LIPID PANEL
CHOLESTEROL: 239 mg/dL — AB (ref 0–200)
HDL: 36.2 mg/dL — ABNORMAL LOW (ref >39.00)
LDL CALC: 168 mg/dL — AB (ref 0–99)
NonHDL: 203.18
TRIGLYCERIDES: 178 mg/dL — AB (ref 0.0–149.0)
Total CHOL/HDL Ratio: 7
VLDL: 35.6 mg/dL (ref 0.0–40.0)

## 2015-12-12 LAB — BASIC METABOLIC PANEL
BUN: 15 mg/dL (ref 6–23)
CALCIUM: 9.1 mg/dL (ref 8.4–10.5)
CO2: 28 mEq/L (ref 19–32)
Chloride: 103 mEq/L (ref 96–112)
Creatinine, Ser: 1.1 mg/dL (ref 0.40–1.50)
GFR: 69.51 mL/min (ref >60.00)
GLUCOSE: 120 mg/dL — AB (ref 70–99)
Potassium: 4.5 mEq/L (ref 3.5–5.1)
SODIUM: 138 meq/L (ref 135–145)

## 2015-12-12 LAB — TSH: TSH: 1.91 u[IU]/mL (ref 0.35–4.50)

## 2015-12-12 NOTE — Progress Notes (Signed)
Subjective:    Patient ID: [name], male    DOB: [birth year], 75 y.o.   MRN: [id number]  HPI Hemorrhoids- pt has appt on 1/10 for hemorrhoidal banding after having colonoscopy 08/18/15.  Recently hemorrhoid has been bleeding and GI gave him Anucort on 11/17.  Has only had 1 episode of bleeding since starting the Anucort cream but pt is very concerned about his blood counts b/c he's on Xarelto.  Oral thrush- pt reports dentist noticed thrush when pt was on Breo.  Now on Symbicort and wants to make sure there is no thrush.  No mouth pain, no ulcers.  Pt desires blood work for upcoming CPE.   Review of Systems For ROS see HPI     Objective:   Physical Exam  Constitutional: He is oriented to person, place, and time. He appears well-developed and well-nourished. No distress.  HENT:  Head: Normocephalic and atraumatic.  Mouth/Throat: Oropharynx is clear and moist. No oropharyngeal exudate.  No evidence of thrush  Neurological: He is alert and oriented to person, place, and time.  Skin: Skin is warm and dry. No pallor.  Vitals reviewed.         Assessment & Plan:

## 2015-12-12 NOTE — Progress Notes (Signed)
Pre visit review using our clinic review tool, if applicable. No additional management support is needed unless otherwise documented below in the visit note

## 2015-12-12 NOTE — Assessment & Plan Note (Signed)
Pt is following w/ GI and has appt scheduled for banding.  Only 1 episode of bleeding since starting the Anucort suppositories.  Will get CBC as pt is worried about the bleeding in the setting of Xarelto.  Reviewed supportive care and red flags that should prompt return.  Pt expressed understanding and is in agreement w/ plan.

## 2015-12-12 NOTE — Patient Instructions (Signed)
Follow up as scheduled for your physical We'll notify you of your lab results and make any changes if needed Continue to use the Anucort suppositories until hemorrhoid has improved.  If no bleeding or discomfort, no need to use There is no sign of thrush Call with any questions or concerns Happy Holidays!!!

## 2015-12-13 ENCOUNTER — Other Ambulatory Visit: Payer: Self-pay | Admitting: General Practice

## 2015-12-13 ENCOUNTER — Other Ambulatory Visit (INDEPENDENT_AMBULATORY_CARE_PROVIDER_SITE_OTHER): Payer: Medicare Other

## 2015-12-13 DIAGNOSIS — R7309 Other abnormal glucose: Secondary | ICD-10-CM

## 2015-12-13 LAB — CBC WITH DIFFERENTIAL/PLATELET
BASOS ABS: 0 10*3/uL (ref 0.0–0.1)
Basophils Relative: 0.5 % (ref 0.0–3.0)
Eosinophils Absolute: 0.3 10*3/uL (ref 0.0–0.7)
Eosinophils Relative: 4.2 % (ref 0.0–5.0)
HEMATOCRIT: 45.2 % (ref 39.0–52.0)
Hemoglobin: 15.2 g/dL (ref 13.0–17.0)
LYMPHS PCT: 31.2 % (ref 12.0–46.0)
Lymphs Abs: 2.4 10*3/uL (ref 0.7–4.0)
MCHC: 33.6 g/dL (ref 30.0–36.0)
MCV: 102.7 fl — AB (ref 78.0–100.0)
MONOS PCT: 7.4 % (ref 3.0–12.0)
Monocytes Absolute: 0.6 10*3/uL (ref 0.1–1.0)
NEUTROS PCT: 56.7 % (ref 43.0–77.0)
Neutro Abs: 4.4 10*3/uL (ref 1.4–7.7)
Platelets: 336 10*3/uL (ref 150.0–400.0)
RBC: 4.4 Mil/uL (ref 4.22–5.81)
RDW: 14.5 % (ref 11.5–15.5)
WBC: 7.8 10*3/uL (ref 4.0–10.5)

## 2015-12-13 LAB — HEMOGLOBIN A1C: Hgb A1c MFr Bld: 5.6 % (ref 4.6–6.5)

## 2015-12-13 MED ORDER — EZETIMIBE 10 MG PO TABS: 10.0000 mg | ORAL_TABLET | Freq: Every day | ORAL | 6 refills | Status: DC

## 2015-12-14 ENCOUNTER — Other Ambulatory Visit: Payer: Self-pay | Admitting: Cardiology

## 2015-12-14 DIAGNOSIS — I5022 Chronic systolic (congestive) heart failure: Secondary | ICD-10-CM

## 2015-12-27 ENCOUNTER — Encounter: Payer: Self-pay | Admitting: Family Medicine

## 2015-12-27 ENCOUNTER — Ambulatory Visit (INDEPENDENT_AMBULATORY_CARE_PROVIDER_SITE_OTHER): Payer: Medicare Other | Admitting: Family Medicine

## 2015-12-27 VITALS — BP 110/60 | HR 60 | Temp 97.8°F | Ht 70.0 in | Wt 218.0 lb

## 2015-12-27 DIAGNOSIS — E784 Other hyperlipidemia: Secondary | ICD-10-CM

## 2015-12-27 DIAGNOSIS — E669 Obesity, unspecified: Secondary | ICD-10-CM | POA: Diagnosis not present

## 2015-12-27 DIAGNOSIS — I10 Essential (primary) hypertension: Secondary | ICD-10-CM

## 2015-12-27 DIAGNOSIS — Z0000 Encounter for general adult medical examination without abnormal findings: Secondary | ICD-10-CM | POA: Diagnosis not present

## 2015-12-27 DIAGNOSIS — I255 Ischemic cardiomyopathy: Secondary | ICD-10-CM

## 2015-12-27 DIAGNOSIS — E7849 Other hyperlipidemia: Secondary | ICD-10-CM

## 2015-12-27 NOTE — Patient Instructions (Addendum)
Follow up in 6 months to recheck cholesterol and BP Your recent labs look great!  No changes at this time Continue to work on healthy diet and regular exercise- you look great!! You are up to date on colonoscopy and immunizations- yay!!! Call with any questions or concerns Happy Holidays!!!   Eat heart healthy diet (full of fruits, vegetables, whole grains, lean protein, water--limit salt, fat, and sugar intake) and increase physical activity as tolerated.  Continue doing brain stimulating activities (puzzles, reading, adult coloring books, staying active) to keep memory sharp.   Bring a copy of your advance directives to your next office visit.   Fat and Cholesterol Restricted Diet High levels of fat and cholesterol in your blood may lead to various health problems, such as diseases of the heart, blood vessels, gallbladder, liver, and pancreas. Fats are concentrated sources of energy that come in various forms. Certain types of fat, including saturated fat, may be harmful in excess. Cholesterol is a substance needed by your body in small amounts. Your body makes all the cholesterol it needs. Excess cholesterol comes from the food you eat. When you have high levels of cholesterol and saturated fat in your blood, health problems can develop because the excess fat and cholesterol will gather along the walls of your blood vessels, causing them to narrow. Choosing the right foods will help you control your intake of fat and cholesterol. This will help keep the levels of these substances in your blood within normal limits and reduce your risk of disease. What is my plan? Your health care provider recommends that you:  Limit your fat intake to ______% or less of your total calories per day.  Limit the amount of cholesterol in your diet to less than _________mg per day.  Eat 20-30 grams of fiber each day. What types of fat should I choose?  Choose healthy fats more often. Choose monounsaturated  and polyunsaturated fats, such as olive and canola oil, flaxseeds, walnuts, almonds, and seeds.  Eat more omega-3 fats. Good choices include salmon, mackerel, sardines, tuna, flaxseed oil, and ground flaxseeds. Aim to eat fish at least two times a week.  Limit saturated fats. Saturated fats are primarily found in animal products, such as meats, butter, and cream. Plant sources of saturated fats include palm oil, palm kernel oil, and coconut oil.  Avoid foods with partially hydrogenated oils in them. These contain trans fats. Examples of foods that contain trans fats are stick margarine, some tub margarines, cookies, crackers, and other baked goods. What general guidelines do I need to follow? These guidelines for healthy eating will help you control your intake of fat and cholesterol:  Check food labels carefully to identify foods with trans fats or high amounts of saturated fat.  Fill one half of your plate with vegetables and green salads.  Fill one fourth of your plate with whole grains. Look for the word "whole" as the first word in the ingredient list.  Fill one fourth of your plate with lean protein foods.  Limit fruit to two servings a day. Choose fruit instead of juice.  Eat more foods that contain fiber, such as apples, broccoli, carrots, beans, peas, and barley.  Eat more home-cooked food and less restaurant, buffet, and fast food.  Limit or avoid alcohol.  Limit foods high in starch and sugar.  Limit fried foods.  Cook foods using methods other than frying. Baking, boiling, grilling, and broiling are all great options.  Lose weight if you are  overweight. Losing just 5-10% of your initial body weight can help your overall health and prevent diseases such as diabetes and heart disease. What foods can I eat? Grains  Whole grains, such as whole wheat or whole grain breads, crackers, cereals, and pasta. Unsweetened oatmeal, bulgur, barley, quinoa, or brown rice. Corn or  whole wheat flour tortillas. Vegetables  Fresh or frozen vegetables (raw, steamed, roasted, or grilled). Green salads. Fruits  All fresh, canned (in natural juice), or frozen fruits. Meats and other protein foods  Ground beef (85% or leaner), grass-fed beef, or beef trimmed of fat. Skinless chicken or [place]. Ground chicken or [place]. Pork trimmed of fat. All fish and seafood. Eggs. Dried beans, peas, or lentils. Unsalted nuts or seeds. Unsalted canned or dry beans. Dairy  Low-fat dairy products, such as skim or 1% milk, 2% or reduced-fat cheeses, low-fat ricotta or cottage cheese, or plain low-fat yo Fats and oils  Tub margarines without trans fats. Light or reduced-fat mayonnaise and salad dressings. Avocado. Olive, canola, sesame, or safflower oils. Natural peanut or almond butter (choose ones without added sugar and oil). The items listed above may not be a complete list of recommended foods or beverages. Contact your dietitian for more options.  Foods to avoid Grains  White bread. White pasta. White rice. Cornbread. Bagels, pastries, and croissants. Crackers that contain trans fat. Vegetables  White potatoes. Corn. Creamed or fried vegetables. Vegetables in a cheese sauce. Fruits  Dried fruits. Canned fruit in light or heavy syrup. Fruit juice. Meats and other protein foods  Fatty cuts of meat. Ribs, chicken wings, bacon, sausage, bologna, salami, chitterlings, fatback, hot dogs, bratwurst, and packaged luncheon meats. Liver and organ meats. Dairy  Whole or 2% milk, cream, half-and-half, and cream cheese. Whole milk cheeses. Whole-fat or sweetened yogurt. Full-fat cheeses. Nondairy creamers and whipped toppings. Processed cheese, cheese spreads, or cheese curds. Beverages  Alcohol. Sweetened drinks (such as sodas, lemonade, and fruit drinks or punches). Fats and oils  Butter, stick margarine, lard, shortening, ghee, or bacon fat. Coconut, palm kernel, or palm oils. Sweets  and desserts  Corn syrup, sugars, honey, and molasses. Candy. Jam and jelly. Syrup. Sweetened cereals. Cookies, pies, cakes, donuts, muffins, and ice cream. The items listed above may not be a complete list of foods and beverages to avoid. Contact your dietitian for more information.  This information is not intended to replace advice given to you by your health care provider. Make sure you discuss any questions you have with your health care provider. Document Released: 12/31/2004 Document Revised: 01/21/2014 Document Reviewed: 03/31/2013 Elsevier Interactive Patient Education  2017 [place] Prevention in the Home Introduction Falls can cause injuries. They can happen to people of all ages. There are many things you can do to make your home safe and to help prevent falls. What can I do on the outside of my home?  Regularly fix the edges of walkways and driveways and fix any cracks.  Remove anything that might make you trip as you walk through a door, such as a raised step or threshold.  Trim any bushes or trees on the path to your home.  Use bright outdoor lighting.  Clear any walking paths of anything that might make someone trip, such as rocks or tools.  Regularly check to see if handrails are loose or broken. Make sure that both sides of any steps have handrails.  Any raised decks and porches should have guardrails on the edges.  Have any leaves, snow, or ice cleared regularly.  Use sand or salt on walking paths during winter.  Clean up any spills in your garage right away. This includes oil or grease spills. What can I do in the bathroom?  Use night lights.  Install grab bars by the toilet and in the tub and shower. Do not use towel bars as grab bars.  Use non-skid mats or decals in the tub or shower.  If you need to sit down in the shower, use a plastic, non-slip stool.  Keep the floor dry. Clean up any water that spills on the floor as soon as it  happens.  Remove soap buildup in the tub or shower regularly.  Attach bath mats securely with double-sided non-slip rug tape.  Do not have throw rugs and other things on the floor that can make you trip. What can I do in the bedroom?  Use night lights.  Make sure that you have a light by your bed that is easy to reach.  Do not use any sheets or blankets that are too big for your bed. They should not hang down onto the floor.  Have a firm chair that has side arms. You can use this for support while you get dressed.  Do not have throw rugs and other things on the floor that can make you trip. What can I do in the kitchen?  Clean up any spills right away.  Avoid walking on wet floors.  Keep items that you use a lot in easy-to-reach places.  If you need to reach something above you, use a strong step stool that has a grab bar.  Keep electrical cords out of the way.  Do not use floor polish or wax that makes floors slippery. If you must use wax, use non-skid floor wax.  Do not have throw rugs and other things on the floor that can make you trip. What can I do with my stairs?  Do not leave any items on the stairs.  Make sure that there are handrails on both sides of the stairs and use them. Fix handrails that are broken or loose. Make sure that handrails are as long as the stairways.  Check any carpeting to make sure that it is firmly attached to the stairs. Fix any carpet that is loose or worn.  Avoid having throw rugs at the top or bottom of the stairs. If you do have throw rugs, attach them to the floor with carpet tape.  Make sure that you have a light switch at the top of the stairs and the bottom of the stairs. If you do not have them, ask someone to add them for you. What else can I do to help prevent falls?  Wear shoes that:  Do not have high heels.  Have rubber bottoms.  Are comfortable and fit you well.  Are closed at the toe. Do not wear sandals.  If you  use a stepladder:  Make sure that it is fully opened. Do not climb a closed stepladder.  Make sure that both sides of the stepladder are locked into place.  Ask someone to hold it for you, if possible.  Clearly mark and make sure that you can see:  Any grab bars or handrails.  First and last steps.  Where the edge of each step is.  Use tools that help you move around (mobility aids) if they are needed. These include:  Canes.  Walkers.  Scooters.  Crutches.  Turn on the lights when you go into a dark area. Replace any light bulbs as soon as they burn out.  Set up your furniture so you have a clear path. Avoid moving your furniture around.  If any of your floors are uneven, fix them.  If there are any pets around you, be aware of where they are.  Review your medicines with your doctor. Some medicines can make you feel dizzy. This can increase your chance of falling. Ask your doctor what other things that you can do to help prevent falls. This information is not intended to replace advice given to you by your health care provider. Make sure you discuss any questions you have with your health care provider. Document Released: 10/27/2008 Document Revised: 06/08/2015 Document Reviewed: 02/04/2014  2017 Elsevier  Health Maintenance, Male A healthy lifestyle and preventative care can promote health and wellness.  Maintain regular health, dental, and eye exams.  Eat a healthy diet. Foods like vegetables, fruits, whole grains, low-fat dairy products, and lean protein foods contain the nutrients you need and are low in calories. Decrease your intake of foods high in solid fats, added sugars, and salt. Get information about a proper diet from your health care provider, if necessary.  Regular physical exercise is one of the most important things you can do for your health. Most adults should get at least 150 minutes of moderate-intensity exercise (any activity that increases your  heart rate and causes you to sweat) each week. In addition, most adults need muscle-strengthening exercises on 2 or more days a week.   Maintain a healthy weight. The body mass index (BMI) is a screening tool to identify possible weight problems. It provides an estimate of body fat based on height and weight. Your health care provider can find your BMI and can help you achieve or maintain a healthy weight. For males 20 years and older:  A BMI below 18.5 is considered underweight.  A BMI of 18.5 to 24.9 is normal.  A BMI of 25 to 29.9 is considered overweight.  A BMI of 30 and above is considered obese.  Maintain normal blood lipids and cholesterol by exercising and minimizing your intake of saturated fat. Eat a balanced diet with plenty of fruits and vegetables. Blood tests for lipids and cholesterol should begin at age 64 and be repeated every 5 years. If your lipid or cholesterol levels are high, you are over age 38, or you are at high risk for heart disease, you may need your cholesterol levels checked more frequently.Ongoing high lipid and cholesterol levels should be treated with medicines if diet and exercise are not working.  If you smoke, find out from your health care provider how to quit. If you do not use tobacco, do not start.  Lung cancer screening is recommended for adults aged 71-80 years who are at high risk for developing lung cancer because of a history of smoking. A yearly low-dose CT scan of the lungs is recommended for people who have at least a 30-pack-year history of smoking and are current smokers or have quit within the past 15 years. A pack year of smoking is smoking an average of 1 pack of cigarettes a day for 1 year (for example, a 30-pack-year history of smoking could mean smoking 1 pack a day for 30 years or 2 packs a day for 15 years). Yearly screening should continue until the smoker has stopped smoking for at least 15 years. Yearly screening  should be stopped for  people who develop a health problem that would prevent them from having lung cancer treatment.  If you choose to drink alcohol, do not have more than 2 drinks per day. One drink is considered to be 12 oz (360 mL) of beer, 5 oz (150 mL) of wine, or 1.5 oz (45 mL) of liquor.  Avoid the use of street drugs. Do not share needles with anyone. Ask for help if you need support or instructions about stopping the use of drugs.  High blood pressure causes heart disease and increases the risk of stroke. High blood pressure is more likely to develop in:  People who have blood pressure in the end of the normal range (100-139/85-89 mm Hg).  People who are overweight or obese.  People who are African American.  If you are 84-32 years of age, have your blood pressure checked every 3-5 years. If you are 24 years of age or older, have your blood pressure checked every year. You should have your blood pressure measured twice-once when you are at a hospital or clinic, and once when you are not at a hospital or clinic. Record the average of the two measurements. To check your blood pressure when you are not at a hospital or clinic, you can use:  An automated blood pressure machine at a pharmacy.  A home blood pressure monitor.  If you are 96-2 years old, ask your health care provider if you should take aspirin to prevent heart disease.  Diabetes screening involves taking a blood sample to check your fasting blood sugar level. This should be done once every 3 years after age 31 if you are at a normal weight and without risk factors for diabetes. Testing should be considered at a younger age or be carried out more frequently if you are overweight and have at least 1 risk factor for diabetes.  Colorectal cancer can be detected and often prevented. Most routine colorectal cancer screening begins at the age of 73 and continues through age 44. However, your health care provider may recommend screening at an earlier  age if you have risk factors for colon cancer. On a yearly basis, your health care provider may provide home test kits to check for hidden blood in the stool. A small camera at the end of a tube may be used to directly examine the colon (sigmoidoscopy or colonoscopy) to detect the earliest forms of colorectal cancer. Talk to your health care provider about this at age 58 when routine screening begins. A direct exam of the colon should be repeated every 5-10 years through age 59, unless early forms of precancerous polyps or small growths are found.  People who are at an increased risk for hepatitis B should be screened for this virus. You are considered at high risk for hepatitis B if:  You were born in a country where hepatitis B occurs often. Talk with your health care provider about which countries are considered high risk.  Your parents were born in a high-risk country and you have not received a shot to protect against hepatitis B (hepatitis B vaccine).  You have HIV or AIDS.  You use needles to inject street drugs.  You live with, or have sex with, someone who has hepatitis B.  You are a man who has sex with other men (MSM).  You get hemodialysis treatment.  You take certain medicines for conditions like cancer, organ transplantation, and autoimmune conditions.  Hepatitis C blood testing  is recommended for all people born from [birth year] through 1964 and any individual with known risk factors for hepatitis C.  Healthy men should no longer receive prostate-specific antigen (PSA) blood tests as part of routine cancer screening. Talk to your health care provider about prostate cancer screening.  Testicular cancer screening is not recommended for adolescents or adult males who have no symptoms. Screening includes self-exam, a health care provider exam, and other screening tests. Consult with your health care provider about any symptoms you have or any concerns you have about testicular  cancer.  Practice safe sex. Use condoms and avoid high-risk sexual practices to reduce the spread of sexually transmitted infections (STIs).  You should be screened for STIs, including gonorrhea and chlamydia if:  You are sexually active and are younger than 24 years.  You are older than 24 years, and your health care provider tells you that you are at risk for this type of infection.  Your sexual activity has changed since you were last screened, and you are at an increased risk for chlamydia or gonorrhea. Ask your health care provider if you are at risk.  If you are at risk of being infected with HIV, it is recommended that you take a prescription medicine daily to prevent HIV infection. This is called pre-exposure prophylaxis (PrEP). You are considered at risk if:  You are a man who has sex with other men (MSM).  You are a heterosexual man who is sexually active with multiple partners.  You take drugs by injection.  You are sexually active with a partner who has HIV.  Talk with your health care provider about whether you are at high risk of being infected with HIV. If you choose to begin PrEP, you should first be tested for HIV. You should then be tested every 3 months for as long as you are taking PrEP.  Use sunscreen. Apply sunscreen liberally and repeatedly throughout the day. You should seek shade when your shadow is shorter than you. Protect yourself by wearing long sleeves, pants, a wide-brimmed hat, and sunglasses year round whenever you are outdoors.  Tell your health care provider of new moles or changes in moles, especially if there is a change in shape or color. Also, tell your health care provider if a mole is larger than the size of a pencil eraser.  A one-time screening for abdominal aortic aneurysm (AAA) and surgical repair of large AAAs by ultrasound is recommended for men aged 51-75 years who are current or former smokers.  Stay current with your vaccines  (immunizations). This information is not intended to replace advice given to you by your health care provider. Make sure you discuss any questions you have with your health care provider. Document Released: 06/29/2007 Document Revised: 01/21/2014 Document Reviewed: 10/04/2014 Elsevier Interactive Patient Education  2017 [employer].

## 2015-12-27 NOTE — Progress Notes (Signed)
Subjective:    Patient ID: [name], male    DOB: 10/30/1940, 75 y.o.   MRN: [id number]  HPI Had Medicare wellness w/ RN- reviewed and agree w/ documentation.  UTD on colonoscopy, immunizations.  Labs look good w/ exception of cholesterol- which he started the Zetia for on 11/29  HTN- chronic problem, on Lasix, Coreg w/ good control today.  Pt reports feeling 'great'.  Denies CP, SOB, HAs, visual changes, edema.  Hyperlipidemia- chronic problem, on Zetia due to statin intolerance.  Denies abd pain, N/V, myalgias  Obesity- chronic problem, BMI 31.  Pt's goal is to get under 200 lbs.  Pt has increased his exercise recently- walking regularly.     Review of Systems For ROS see HPI     Objective:   Physical Exam  Constitutional: He is oriented to person, place, and time. He appears well-developed and well-nourished. No distress.  HENT:  Head: Normocephalic and atraumatic.  Eyes: Conjunctivae and EOM are normal. Pupils are equal, round, and reactive to light.  Neck: Normal range of motion. Neck supple. No thyromegaly present.  Cardiovascular: Normal rate, normal heart sounds and intact distal pulses.   No murmur heard. Irregularly irregular  Pulmonary/Chest: Effort normal and breath sounds normal. No respiratory distress.  Abdominal: Soft. Bowel sounds are normal. He exhibits no distension.  Musculoskeletal: He exhibits no edema.  Lymphadenopathy:    He has no cervical adenopathy.  Neurological: He is alert and oriented to person, place, and time. No cranial nerve deficit.  Skin: Skin is warm and dry.  Psychiatric: He has a normal mood and affect. His behavior is normal.  Vitals reviewed.         Assessment & Plan:

## 2015-12-27 NOTE — Progress Notes (Signed)
Pre visit review using our clinic review tool, if applicable. No additional management support is needed unless otherwise documented below in the visit note

## 2015-12-27 NOTE — Assessment & Plan Note (Signed)
LDL was elevated at recent lab check.  Due to statin intolerance, he started Zetia.  Tolerating this w/o difficulty.  Will continue to follow at future visits w/ repeat labs.  Pt expressed understanding and is in agreement w/ plan.

## 2015-12-27 NOTE — Progress Notes (Signed)
Subjective:   [name] is a 75 y.o. male who presents for an Initial Medicare Annual Wellness Visit.  The Patient was informed that the wellness visit is to identify future health risk and educate and initiate measures that can reduce risk for increased disease through the lifespan.   Describes health as fair, good or great? "good"  Review of Systems  No ROS.  Medicare Wellness Visit.  Cardiac Risk Factors include: advanced age (>27mn, >>26women);dyslipidemia;family history of premature cardiovascular disease;hypertension;male gender;obesity (BMI >30kg/m2)   Sleep patterns: Sleeps 8-9 hours. Up 2 x to void. Home Safety/Smoke Alarms:  Smoke detectors in place Living environment; residence and Firearm Safety:Lives with wife and 3 cats in 1 story home. Feels safe in home. Firearms locked.  Seat Belt Safety/Bike Helmet: Wears seat belt   Counseling:   [facility] 2017[facility]  [facility]:   [facility]-colonoscopy 08/18/2015, polyp. Recall 5 years.      PSA- 12/12/2015, 1.59. Followed by [facility]-01/09/2011, normal.     Objective:    Today's Vitals   12/27/15 1001  BP: 110/60  Pulse: 60  SpO2: 96%  Weight: 218 lb (98.9 kg)  Height: _0  (1.778 m)   Body mass index is 31.28 kg/m.  Current Medications (verified) Outpatient Encounter Prescriptions as of 12/27/2015  Medication Sig  . albuterol (VENTOLIN HFA) 108 (90 Base) MCG/ACT inhaler Inhale 1-2 puffs into the lungs every 4 (four) hours as needed for wheezing or shortness of breath.  .Marland Kitchenamiodarone (PACERONE) 200 MG tablet TAKE 1 TABLET(200 MG) BY MOUTH DAILY  . budesonide-formoterol (SYMBICORT) 160-4.5 MCG/ACT inhaler Inhale 2 puffs into the lungs 2 (two) times daily.  . carvedilol (COREG) 12.5 MG tablet TAKE 1 TABLET BY MOUTH TWICE DAILY WITH A MEAL  . cholecalciferol (VITAMIN D) 1000 units tablet Take 1,000 Units  by mouth daily.  .Marland KitchenENTRESTO 24-26 MG TAKE 1 TABLET BY MOUTH TWICE DAILY  . eplerenone (INSPRA) 25 MG tablet TAKE 1 TABLET(25 MG) BY MOUTH DAILY  . ezetimibe (ZETIA) 10 MG tablet Take 1 tablet (10 mg total) by mouth daily.  . furosemide (LASIX) 40 MG tablet TAKE 2 TABLETS(80 MG) BY MOUTH DAILY  . hydrocortisone (ANUSOL-HC) 25 MG suppository Place 1 suppository (25 mg total) rectally every 12 (twelve) hours.  .Marland Kitchenipratropium (ATROVENT) 0.06 % nasal spray Place 2 sprays into the nose 4 (four) times daily.  .Marland Kitchenloratadine (CLARITIN) 10 MG tablet Take 10 mg by mouth daily.  . Magnesium 250 MG TABS Take 1 tablet by mouth daily.  .Marland Kitchenmexiletine (MEXITIL) 150 MG capsule Take 2 capsules (300 mg total) by mouth 2 (two) times daily.  . Multiple Vitamin (MULTIVITAMIN WITH MINERALS) TABS Take 1 tablet by mouth daily.  . nitroGLYCERIN (NITROSTAT) 0.4 MG SL tablet Place 1 tablet (0.4 mg total) under the tongue every 5 (five) minutes as needed for chest pain.  .Marland Kitchenomeprazole (PRILOSEC) 20 MG capsule Take 20 mg by mouth daily.  . polyethylene glycol (MIRALAX / GLYCOLAX) packet Take 17 g by mouth daily.  . polyvinyl alcohol-povidone (REFRESH) 1.4-0.6 % ophthalmic solution Place 1-2 drops into both eyes as needed (dry eyes).   . potassium chloride SA (K-DUR,KLOR-CON) 20 MEQ tablet TAKE 1 TABLET BY MOUTH EVERY DAY  . Probiotic Product (PROBIOTIC DAILY PO) Take 1 tablet by mouth daily.  . Psyllium (METAMUCIL PO) Take by mouth 2 (two) times daily. Take 3  teaspoons by mouth 2 times daily  . XARELTO 20 MG TABS tablet TAKE 1 TABLET(20 MG) BY MOUTH DAILY WITH DINNER   No facility-administered encounter medications on file as of 12/27/2015.     Allergies (verified) Sulfa antibiotics and Crestor [rosuvastatin]   History: Past Medical History:  Diagnosis Date  . Adenomatous colon polyp 2009; 06/2012   Colonoscopy 07/2007---Dr. Barron Schmid GI--Repeat 06/2012 showed one tubular adenoma w/out high grade dysplasia.  [name] AICD  (automatic cardioverter/defibrillator) present    [employer]  . Atrial fibrillation ([place])   . Bilateral sensorineural hearing loss   . BPH (benign prostatic hypertrophy)    PSAs ok (followed by urologist): no LUTS  . CAD (coronary artery disease)    PTCA RCA 1997 (Inf/post MI);  . CHF (congestive heart failure) ([place])   . Chronic constipation   . Chronic rhinitis    ?vasomotor (Dr. Erik Obey)  . COPD (chronic obstructive pulmonary disease) ([place])   . Diverticulosis of colon 2005   Noted on Colonoscopies in 2005 and 2009 and on CT 2010  . Gallstones 06/2014   No evidence of cholecystitis on abd u/s 06/2014  . GERD (gastroesophageal reflux disease)   . Hepatic steatosis 2010; 2016   Noted on CT abd 2010 and on u/s abd 04/2009 (transaminasemia) and u/s 06/2014.  [name] Hyperlipidemia   . Hypertension   . Ischemic cardiomyopathy   . Microscopic hematuria    Cysto neg 02/2009 except for BPH-likely has microhem from his renal stone dz  . Nephrolithiasis    No distal stones on CT 2010; stable 8 mm RLP stone, 80m LMP stone 10/2013 plain film.  Abd u/s 06/2014: 9 mm nonobstructing lower pole right renal stone.  03/2015 urol: stable nonobstructing renal calc by CT 12/2014 and KUB 03/2015.  .Marland KitchenObstructive sleep apnea    no cpap uses since weight loss of 50 pounds  . Presence of permanent cardiac pacemaker    [employer] . Squamous cell carcinoma of back   . Ventricular tachycardia ([facility]    Past Surgical History:  Procedure Laterality Date  . ATRIAL ABLATION SURGERY     flutter ablation 2002  . BREAST SURGERY Left 1990   "Tissue removed from breast"  . CARDIAC CATHETERIZATION    . carotid doppler  09/25/12   No signif stenosis  . COLONOSCOPY N/A 07/13/2012   Diverticula ascending colon.  Polyp x 1-recall 5 yrs (Dr. MWatt Climes Procedure: COLONOSCOPY;  Surgeon: MJeryl Columbia MD;  [facility];  Service: Endoscopy;  Laterality: N/A; polypectomy x 1   . COLONOSCOPY WITH PROPOFOL N/A  08/18/2015   Procedure: COLONOSCOPY WITH PROPOFOL;  Surgeon: KMauri Pole MD;  [facility];  Service: Endoscopy;  Laterality: N/A;  . ESOPHAGOGASTRODUODENOSCOPY N/A 07/13/2012   Procedure: ESOPHAGOGASTRODUODENOSCOPY (EGD);  Surgeon: MJeryl Columbia MD;  [facility];  Service: Endoscopy;  Laterality: N/A;  NORMAL  . EXTRACORPOREAL SHOCK WAVE LITHOTRIPSY    . ICD     a) Guidant Contak H170- SVirl Axe MD 07/25/06 (second device) b) Medtronic CRT-D  . INGUINAL HERNIA REPAIR Bilateral 1975  . LEAD REVISION Bilateral 12/26/2011   Procedure: LEAD REVISION;  Surgeon: GEvans Lance MD;  [facility];  Service: Cardiovascular;  Laterality: Bilateral;  . PFTs  02/2015   Mod restriction (interstitial), mod diffusion defect (Dr. YAnnamaria Boots  . precancer area keratsis removed  05/2015    leftf orehead and left arm and right arm and back   Family History  Problem Relation Age of Onset  . Heart disease Mother   . Other Mother     colon surgery for fistula  . Gallstones Mother   . Breast cancer Maternal Grandmother   . Coronary artery disease Father     family hx of  . Heart attack Father     4 stents/ pacemaker  . Nephrolithiasis Father   . Hypertension Sister   . Goiter Paternal Grandmother   . Nephrolithiasis Son   . Colon cancer Neg Hx    Social History   Occupational History  . Retired Retired   Social History Main Topics  . Smoking status: Former Smoker    Packs/day: 2.00    Years: 15.00    Types: Cigarettes    Quit date: 01/15/1980  . Smokeless tobacco: Never Used  . Alcohol use No  . Drug use: No  . Sexual activity: Not on file   Tobacco Counseling Counseling given: Not Answered   Activities of Daily Living In your present state of health, do you have any difficulty performing the following activities: 12/27/2015 07/10/2015  Hearing? N Y  Vision? N N  Difficulty concentrating or making decisions? N N  Walking or climbing stairs? N N    Dressing or bathing? N N  Doing errands, shopping? N N  Preparing Food and eating ? N -  Using the Toilet? N -  In the past six months, have you accidently leaked urine? N -  Do you have problems with loss of bowel control? N -  Managing your Medications? N -  Managing your Finances? N -  Housekeeping or managing your Housekeeping? N -  Some recent data might be hidden    Immunizations and Health Maintenance Immunization History  Administered Date(s) Administered  . Influenza Split 11/11/2010, 11/02/2012, 10/14/2013  . Influenza Whole 09/27/2011, 11/13/2011  . Influenza, High Dose Seasonal PF 09/26/2015  . Influenza-Unspecified 09/26/2014  . Pneumococcal Conjugate-13 06/15/2014  . Pneumococcal Polysaccharide-23 03/15/2002, 07/10/2015  . Tdap 04/04/2010  . Zoster 05/15/2015   There are no preventive care reminders to display for this patient.  Patient Care Team: [name], MD as PCP - General (Family Medicine) Festus Aloe, MD as Consulting Physician (Urology) Michael Boston, MD as Consulting Physician (General Surgery) [name], MD as Consulting Physician (Cardiology) Jodi Marble, MD as Consulting Physician (Otolaryngology) Deboraha Sprang, MD as Consulting Physician (Cardiology) Luberta Mutter, MD as Consulting Physician (Ophthalmology) Collene Gobble, MD as Consulting Physician (Pulmonary Disease) Mauri Pole, MD as Consulting Physician (Gastroenterology)  Indicate any recent Medical Services you may have received from other than [facility] providers in the past year (date may be approximate).    Assessment:   This is a routine wellness examination for [name]. Physical assessment deferred to PCP.   Hearing/Vision screen Hearing Screening Comments: Able to hear conversational tones w/o difficulty. No issues reported.  Tested recently, "Borderline on left ear, Right ok" Vision Screening Comments: Reading glasses.   Dietary issues and exercise  activities discussed: Current Exercise Habits: Home exercise routine (yard work), Type of exercise: walking, Exercise limited by: cardiac condition(s);respiratory conditions(s)   Diet (meal preparation, eat out, water intake, caffeinated beverages, dairy products, fruits and vegetables): Drinks 1 diet coke, water.  Breakfast: applesauce, jello, peaches, ham/egg/cheese biscuit, bagel with cream cheese.  Lunch: meat and vegetables Dinner: sandwich, soup  Discussed low cholesterol diet, water intake and exercise. Plans to increase activity to reach goal weight of 200 lbs. Patient states cardiology limits  his water quantity.   Goals    . Weight (lb) < 200 lb (90.7 kg)          Lose 18 pounds by increasing activity.       Depression Screen PHQ 2/9 Scores 12/27/2015 07/10/2015  PHQ - 2 Score 0 1    Fall Risk Fall Risk  12/27/2015 07/10/2015  Falls in the past year? Yes Yes  Number falls in past yr: 2 or more 2 or more  Injury with Fall? No No  Risk for fall due to : History of fall(s) -  Follow up Falls prevention discussed -    Cognitive Function:       Ad8 score reviewed for issues:  Issues making decisions:no  Less interest in hobbies / activities: no  Repeats questions, stories (family complaining):no  Trouble using ordinary gadgets (microwave, computer, phone):no  Forgets the month or year: no  Mismanaging finances: no  Remembering appts:no  Daily problems with thinking and/or memory:no Ad8 score is= 0     Screening Tests Health Maintenance  Topic Date Due  . TETANUS/TDAP  04/03/2020  . COLONOSCOPY  08/17/2020  . INFLUENZA VACCINE  Addressed  . ZOSTAVAX  Completed  . PNA vac Low Risk Adult  Completed        Plan:     Eat heart healthy diet (full of fruits, vegetables, whole grains, lean protein, water--limit salt, fat, and sugar intake) and increase physical activity as tolerated.  Continue doing brain stimulating activities (puzzles, reading,  adult coloring books, staying active) to keep memory sharp.   Bring a copy of your advance directives to your next office visit.    During the course of the visit [name] was educated and counseled about the following appropriate screening and preventive services:   Vaccines to include Pneumoccal, Influenza, Hepatitis B, Td, Zostavax, HCV  Colorectal cancer screening  Cardiovascular disease screening  Diabetes screening  Glaucoma screening  Nutrition counseling  Prostate cancer screening   Patient Instructions (the written plan) were given to the patient.   Gerilyn Nestle, RN   12/27/2015

## 2015-12-27 NOTE — Assessment & Plan Note (Signed)
Ongoing issue for pt.  He has recently started walking to improve his fitness level.  Discussed need for healthy diet.  Reviewed recent labs- Lipids were elevated and we started Zetia to reduce risk.  Will continue to follow.

## 2015-12-27 NOTE — Assessment & Plan Note (Signed)
Chronic problem.  Well controlled today.  Asymptomatic.  Reviewed recent labs.  No med changes at this time.  Will follow along.

## 2016-01-03 ENCOUNTER — Encounter: Payer: Self-pay | Admitting: Internal Medicine

## 2016-01-03 ENCOUNTER — Ambulatory Visit (INDEPENDENT_AMBULATORY_CARE_PROVIDER_SITE_OTHER): Payer: Medicare Other | Admitting: Internal Medicine

## 2016-01-03 VITALS — BP 102/70 | HR 68 | Ht 70.0 in | Wt 215.2 lb

## 2016-01-03 DIAGNOSIS — I059 Rheumatic mitral valve disease, unspecified: Secondary | ICD-10-CM

## 2016-01-03 DIAGNOSIS — I2589 Other forms of chronic ischemic heart disease: Secondary | ICD-10-CM

## 2016-01-03 DIAGNOSIS — I5022 Chronic systolic (congestive) heart failure: Secondary | ICD-10-CM

## 2016-01-03 DIAGNOSIS — Z95810 Presence of automatic (implantable) cardiac defibrillator: Secondary | ICD-10-CM | POA: Diagnosis not present

## 2016-01-03 DIAGNOSIS — I472 Ventricular tachycardia: Secondary | ICD-10-CM | POA: Diagnosis not present

## 2016-01-03 DIAGNOSIS — I480 Paroxysmal atrial fibrillation: Secondary | ICD-10-CM

## 2016-01-03 DIAGNOSIS — I4729 Other ventricular tachycardia: Secondary | ICD-10-CM

## 2016-01-03 DIAGNOSIS — I255 Ischemic cardiomyopathy: Secondary | ICD-10-CM

## 2016-01-03 LAB — CUP PACEART INCLINIC DEVICE CHECK
HIGH POWER IMPEDANCE MEASURED VALUE: 53 Ohm
Implantable Lead Implant Date: 20020527
Implantable Lead Location: 753858
Implantable Lead Location: 753859
Implantable Lead Model: 148
Implantable Lead Model: 6940
Implantable Lead Serial Number: 120244
Implantable Pulse Generator Implant Date: 20111207
Lead Channel Impedance Value: 600 Ohm
Lead Channel Impedance Value: 922 Ohm
Lead Channel Pacing Threshold Amplitude: 1.2 V
Lead Channel Pacing Threshold Amplitude: 1.9 V
Lead Channel Pacing Threshold Pulse Width: 0.4 ms
Lead Channel Pacing Threshold Pulse Width: 1 ms
Lead Channel Setting Pacing Amplitude: 2 V
Lead Channel Setting Pacing Amplitude: 2.5 V
Lead Channel Setting Pacing Pulse Width: 0.4 ms
Lead Channel Setting Pacing Pulse Width: 1 ms
MDC IDC LEAD IMPLANT DT: 20020527
MDC IDC LEAD IMPLANT DT: 20131212
MDC IDC LEAD LOCATION: 753860
MDC IDC LEAD MODEL: 4194
MDC IDC MSMT LEADCHNL RA PACING THRESHOLD AMPLITUDE: 0.7 V
MDC IDC MSMT LEADCHNL RA PACING THRESHOLD PULSEWIDTH: 0.3 ms
MDC IDC MSMT LEADCHNL RA SENSING INTR AMPL: 6 mV
MDC IDC MSMT LEADCHNL RV IMPEDANCE VALUE: 680 Ohm
MDC IDC SESS DTM: 20171220171020
MDC IDC SET LEADCHNL LV SENSING SENSITIVITY: 1 mV
MDC IDC SET LEADCHNL RV PACING AMPLITUDE: 2.5 V
MDC IDC SET LEADCHNL RV SENSING SENSITIVITY: 0.5 mV
MDC IDC STAT BRADY RA PERCENT PACED: 100 %
MDC IDC STAT BRADY RV PERCENT PACED: 100 %
Pulse Gen Serial Number: 492371

## 2016-01-03 NOTE — Patient Instructions (Addendum)
Medication Instructions: - Your physician recommends that you continue on your current medications as directed. Please refer to the Current Medication list given to you today.  Labwork: - none ordered  Procedures/Testing: - none ordered  Follow-Up: - Your physician wants you to follow-up in: 1 year with Dr. Caryl Comes. You will receive a reminder letter in the mail two months in advance. If you don't receive a letter, please call our office to schedule the follow-up appointment.  Any Additional Special Instructions Will Be Listed Below (If Applicable).  ** I looked back over your cardiac drugs and all of your refills were up to date - these were done by Dr. Stanford Breed **    If you need a refill on your cardiac medications before your next appointment, please call your pharmacy.

## 2016-01-03 NOTE — Progress Notes (Signed)
Patient Care Team: [name], MD as PCP - General (Family Medicine) Festus Aloe, MD as Consulting Physician (Urology) Michael Boston, MD as Consulting Physician (General Surgery) [name], MD as Consulting Physician (Cardiology) Jodi Marble, MD as Consulting Physician (Otolaryngology) Deboraha Sprang, MD as Consulting Physician (Cardiology) Luberta Mutter, MD as Consulting Physician (Ophthalmology) Collene Gobble, MD as Consulting Physician (Pulmonary Disease) Mauri Pole, MD as Consulting Physician (Gastroenterology)   HPI  [name] is a 75 y.o. male Seen in followup for congestive heart failure in the setting of ischemic heart disease and previously implanted CRT-D with subsequent left ventricular lead macro dislodgment. His symptoms however been quite stable. When he saw Dr. Lovena Le concerning lead extraction and lead implantation he was doing as well as he had for some time and it was decided not to pursue it then. Ultimately this was done  2013  He also has ventricular tachycardia for which he takes amiodarone. LFTs were abnormal and his Crestor was stopped.   Last cardiac catheterization in October 2008 showed an occluded right coronary artery with left-to-right collateralization. Ejection fraction is 20%. There was no other coronary disease noted. Last Myoview in June of 2010 showed prior inferior infarct but no ischemia. His ejection fraction was 21%previous carotid Dopplers in April 2006 showed 0-39% stenosis bilaterally. Previous abdominal ultrasound in April 2006 showed no aneurysm.  Echo 1/17 EF 20-25%     Initially, he was not impressed at improvement nor was his wife. We undertook AV optimization and this is been associated with a vast improvement in his functional status This has been sustained, denying CP SOB or edema     Date TSH LFTs PFTs  11/17  1.91 31         Past Medical History:  Diagnosis Date  . Adenomatous  colon polyp 2009; 06/2012   Colonoscopy 07/2007---Dr. Barron Schmid GI--Repeat 06/2012 showed one tubular adenoma w/out high grade dysplasia.  [name] AICD (automatic cardioverter/defibrillator) present    [employer]  . Atrial fibrillation ([place])   . Bilateral sensorineural hearing loss   . BPH (benign prostatic hypertrophy)    PSAs ok (followed by urologist): no LUTS  . CAD (coronary artery disease)    PTCA RCA 1997 (Inf/post MI);  . CHF (congestive heart failure) ([place])   . Chronic constipation   . Chronic rhinitis    ?vasomotor (Dr. Erik Obey)  . COPD (chronic obstructive pulmonary disease) ([place])   . Diverticulosis of colon 2005   Noted on Colonoscopies in 2005 and 2009 and on CT 2010  . Gallstones 06/2014   No evidence of cholecystitis on abd u/s 06/2014  . GERD (gastroesophageal reflux disease)   . Hepatic steatosis 2010; 2016   Noted on CT abd 2010 and on u/s abd 04/2009 (transaminasemia) and u/s 06/2014.  [name] Hyperlipidemia   . Hypertension   . Ischemic cardiomyopathy   . Microscopic hematuria    Cysto neg 02/2009 except for BPH-likely has microhem from his renal stone dz  . Nephrolithiasis    No distal stones on CT 2010; stable 8 mm RLP stone, 46m LMP stone 10/2013 plain film.  Abd u/s 06/2014: 9 mm nonobstructing lower pole right renal stone.  03/2015 urol: stable nonobstructing renal calc by CT 12/2014 and KUB 03/2015.  .Marland KitchenObstructive sleep apnea    no cpap uses since weight loss of 50 pounds  . Presence of permanent cardiac pacemaker    [employer] . Squamous  cell carcinoma of back   . Ventricular tachycardia [facility])     Past Surgical History:  Procedure Laterality Date  . ATRIAL ABLATION SURGERY     flutter ablation 2002  . BREAST SURGERY Left 1990   "Tissue removed from breast"  . CARDIAC CATHETERIZATION    . carotid doppler  09/25/12   No signif stenosis  . COLONOSCOPY N/A 07/13/2012   Diverticula ascending colon.  Polyp x 1-recall 5 yrs (Dr. Watt Climes) Procedure:  COLONOSCOPY;  Surgeon: Jeryl Columbia, MD;  [facility];  Service: Endoscopy;  Laterality: N/A; polypectomy x 1   . COLONOSCOPY WITH PROPOFOL N/A 08/18/2015   Procedure: COLONOSCOPY WITH PROPOFOL;  Surgeon: Mauri Pole, MD;  [facility];  Service: Endoscopy;  Laterality: N/A;  . ESOPHAGOGASTRODUODENOSCOPY N/A 07/13/2012   Procedure: ESOPHAGOGASTRODUODENOSCOPY (EGD);  Surgeon: Jeryl Columbia, MD;  [facility];  Service: Endoscopy;  Laterality: N/A;  NORMAL  . EXTRACORPOREAL SHOCK WAVE LITHOTRIPSY    . ICD     a) Guidant Contak H170- Virl Axe, MD 07/25/06 (second device) b) Medtronic CRT-D  . INGUINAL HERNIA REPAIR Bilateral 1975  . LEAD REVISION Bilateral 12/26/2011   Procedure: LEAD REVISION;  Surgeon: Evans Lance, MD;  Location: [facility] C[facility] LAB;  Service: Cardiovascular;  Laterality: Bilateral;  . PFTs  02/2015   Mod restriction (interstitial), mod diffusion defect (Dr. Annamaria Boots)  . precancer area keratsis removed  05/2015    leftf orehead and left arm and right arm and back    Current Outpatient Prescriptions  Medication Sig Dispense Refill  . albuterol (VENTOLIN HFA) 108 (90 Base) MCG/ACT inhaler Inhale 1-2 puffs into the lungs every 4 (four) hours as needed for wheezing or shortness of breath. 1 Inhaler 2  . amiodarone (PACERONE) 200 MG tablet TAKE 1 TABLET(200 MG) BY MOUTH DAILY 90 tablet 3  . antiseptic oral rinse (BIOTENE) LIQD 15 mLs by Mouth Rinse route as needed for dry mouth (TID).    [name] budesonide-formoterol (SYMBICORT) 160-4.5 MCG/ACT inhaler Inhale 2 puffs into the lungs 2 (two) times daily. 1 Inhaler 5  . carvedilol (COREG) 12.5 MG tablet TAKE 1 TABLET BY MOUTH TWICE DAILY WITH A MEAL 180 tablet 3  . cholecalciferol (VITAMIN D) 1000 units tablet Take 1,000 Units by mouth daily.    [name] ENTRESTO 24-26 MG TAKE 1 TABLET BY MOUTH TWICE DAILY 60 tablet 10  . eplerenone (INSPRA) 25 MG tablet TAKE 1 TABLET(25 MG) BY MOUTH DAILY 90 tablet 3  . ezetimibe  (ZETIA) 10 MG tablet Take 1 tablet (10 mg total) by mouth daily. 30 tablet 6  . furosemide (LASIX) 40 MG tablet TAKE 2 TABLETS(80 MG) BY MOUTH DAILY 180 tablet 1  . hydrocortisone (ANUSOL-HC) 25 MG suppository Place 1 suppository (25 mg total) rectally every 12 (twelve) hours. 12 suppository 1  . ipratropium (ATROVENT) 0.06 % nasal spray Place 2 sprays into the nose 4 (four) times daily. 15 mL 6  . loratadine (CLARITIN) 10 MG tablet Take 10 mg by mouth daily.    . Magnesium 250 MG TABS Take 1 tablet by mouth daily.    [name] mexiletine (MEXITIL) 150 MG capsule Take 2 capsules (300 mg total) by mouth 2 (two) times daily. 360 capsule 3  . Multiple Vitamin (MULTIVITAMIN WITH MINERALS) TABS Take 1 tablet by mouth daily.    . nitroGLYCERIN (NITROSTAT) 0.4 MG SL tablet Place 1 tablet (0.4 mg total) under the tongue every 5 (five) minutes as needed for chest pain. 25  tablet 3  . omeprazole (PRILOSEC) 20 MG capsule Take 20 mg by mouth daily.    . polyethylene glycol (MIRALAX / GLYCOLAX) packet Take 17 g by mouth daily.    . polyvinyl alcohol-povidone (REFRESH) 1.4-0.6 % ophthalmic solution Place 1-2 drops into both eyes as needed (dry eyes).     . potassium chloride SA (K-DUR,KLOR-CON) 20 MEQ tablet TAKE 1 TABLET BY MOUTH EVERY DAY 90 tablet 1  . Probiotic Product (PROBIOTIC DAILY PO) Take 1 tablet by mouth daily.    . Psyllium (METAMUCIL PO) Take by mouth 2 (two) times daily. Take 3 teaspoons by mouth 2 times daily    . XARELTO 20 MG TABS tablet TAKE 1 TABLET(20 MG) BY MOUTH DAILY WITH DINNER 30 tablet 5   No current facility-administered medications for this visit.     Allergies  Allergen Reactions  . Sulfa Antibiotics Rash  . Crestor [Rosuvastatin]     Review of Systems negative except from HPI and PMH  Physical Exam BP 102/70   Pulse 68   Ht _0  (1.778 m)   Wt 215 lb 3.2 oz (97.6 kg)   SpO2 96%   BMI 30.88 kg/m  Well developed and well nourished in no acute distress HENT normal E  scleral and icterus clear Neck Supple JVP flat; carotids brisk and full Clear to ausculation Device pocket well healed; without hematoma or erythema.  There is no tethering Regular rate and rhythm, no murmurs gallops or rub Soft with active bowel sounds No clubbing cyanosis none  Edema Alert and oriented, grossly normal motor and sensory function Skin Warm and Dry  ECG demonstrates P synchronous biventricular pacing- upright QRS V1 and neg 1  Assessment and  Plan   Ventricular tachycardia  Ischemic cardiomyopathy  Congestive heart failure-chronic-systolic  CRT-D-[employer]. [employer]'s  Atrial fibrillation  Overall he is doing great. He is euvolemic.   Without symptoms of ischemia  Amio labs were reviewed and were normal   No intercurrent Ventricular tachycardia  Euvolemic continue current meds   No significant interval atrial fibrillation and no ventricular tachycardia

## 2016-01-05 ENCOUNTER — Ambulatory Visit: Payer: Medicare Other | Admitting: Family Medicine

## 2016-01-16 ENCOUNTER — Telehealth: Payer: Self-pay | Admitting: *Deleted

## 2016-01-16 ENCOUNTER — Other Ambulatory Visit: Payer: Self-pay

## 2016-01-16 NOTE — Telephone Encounter (Signed)
Patient is needing clearance for hemorrhoidal banding procedure. They are asking for the okay for the patient to hold xarelto for 2 days prior to the procedure scheduled for 01/24/16. Will forward for dr Stanford Breed review

## 2016-01-17 NOTE — Telephone Encounter (Signed)
Hold xarelto 2 days prior to procedure and resume 2 days after [name]

## 2016-01-17 NOTE — Telephone Encounter (Signed)
Will fax this note to the number provided.

## 2016-01-22 ENCOUNTER — Telehealth: Payer: Self-pay | Admitting: Gastroenterology

## 2016-01-22 NOTE — Telephone Encounter (Signed)
Returned patients call, Ok to hold Xarelto two days before banding  Answered questions about banding procedure for patient and told him to contact the office if he had any further questions

## 2016-01-22 NOTE — Telephone Encounter (Signed)
Joel Mylar do you know anything about this pt and his xeralto?

## 2016-01-24 ENCOUNTER — Ambulatory Visit (INDEPENDENT_AMBULATORY_CARE_PROVIDER_SITE_OTHER): Payer: Medicare Other | Admitting: Gastroenterology

## 2016-01-24 ENCOUNTER — Encounter: Payer: Self-pay | Admitting: Gastroenterology

## 2016-01-24 VITALS — BP 100/72 | HR 60 | Ht 70.0 in | Wt 215.4 lb

## 2016-01-24 DIAGNOSIS — K642 Third degree hemorrhoids: Secondary | ICD-10-CM

## 2016-01-24 NOTE — Progress Notes (Signed)
PROCEDURE NOTE: The patient presents with symptomatic grade III  hemorrhoids, requesting rubber band ligation of his/her hemorrhoidal disease.  All risks, benefits and alternative forms of therapy were described and informed consent was obtained.  In the Left Lateral Decubitus position anoscopic examination revealed grade III hemorrhoids in the Right anterior and Posterior position(s).  Grade II hemorrhoids in the left lateral position The anorectum was pre-medicated with 0.125% nitroglycerin and rectal care The decision was made to band the right anterior internal hemorrhoid, and the [place] was used to perform band ligation without complication.  Digital anorectal examination was then performed to assure proper positioning of the band, and to adjust the banded tissue as required.  The patient was discharged home without pain or other issues.  Dietary and behavioral recommendations were given and along with follow-up instructions.     The following adjunctive treatments were recommended:  Restart Xarelto tomorrow  The patient will return in 2-4 weeks for  follow-up and possible additional banding as required. No complications were encountered and the patient tolerated the procedure well.  Damaris Hippo , MD [phone] Mon-Fri 8a-5p [phone] after 5p, weekends, holidays

## 2016-01-24 NOTE — Patient Instructions (Addendum)
HEMORRHOID BANDING PROCEDURE    FOLLOW-UP CARE   1. The procedure you have had should have been relatively painless since the banding of the area involved does not have nerve endings and there is no pain sensation.  The rubber band cuts off the blood supply to the hemorrhoid and the band may fall off as soon as 48 hours after the banding (the band may occasionally be seen in the toilet bowl following a bowel movement). You may notice a temporary feeling of fullness in the rectum which should respond adequately to plain Tylenol or Motrin.  2. Following the banding, avoid strenuous exercise that evening and resume full activity the next day.  A sitz bath (soaking in a warm tub) or bidet is soothing, and can be useful for cleansing the area after bowel movements.     3. To avoid constipation, take two tablespoons of natural wheat bran, natural oat bran, flax, Benefiber or any over the counter fiber supplement and increase your water intake to 7-8 glasses daily.    4. Unless you have been prescribed anorectal medication, do not put anything inside your rectum for two weeks: No suppositories, enemas, fingers, etc.  5. Occasionally, you may have more bleeding than usual after the banding procedure.  This is often from the untreated hemorrhoids rather than the treated one.  Don't be concerned if there is a tablespoon or so of blood.  If there is more blood than this, lie flat with your bottom higher than your head and apply an ice pack to the area. If the bleeding does not stop within a half an hour or if you feel faint, call our office at [phone] or go to the emergency room.  6. Problems are not common; however, if there is a substantial amount of bleeding, severe pain, chills, fever or difficulty passing urine (very rare) or other problems, you should call us at [phone] or report to the nearest emergency room.  7. Do not stay seated continuously for more than 2-3 hours for a day or two  after the procedure.  Tighten your buttock muscles 10-15 times every two hours and take 10-15 deep breaths every 1-2 hours.  Do not spend more than a few minutes on the toilet if you cannot empty your bowel; instead re-visit the toilet at a later time.   Start Xarelto back tomorrow   Hold Xarelto 24 hours before your next banding

## 2016-01-26 DIAGNOSIS — L308 Other specified dermatitis: Secondary | ICD-10-CM | POA: Diagnosis not present

## 2016-01-26 DIAGNOSIS — L570 Actinic keratosis: Secondary | ICD-10-CM | POA: Diagnosis not present

## 2016-01-30 ENCOUNTER — Other Ambulatory Visit: Payer: Self-pay | Admitting: Cardiology

## 2016-02-02 ENCOUNTER — Telehealth: Payer: Self-pay | Admitting: Internal Medicine

## 2016-02-02 ENCOUNTER — Telehealth: Payer: Self-pay | Admitting: Cardiology

## 2016-02-02 NOTE — Telephone Encounter (Signed)
Vicente Males from [facility] is calling because she needs clinical information for patient. Information needed: diagnosis (name or code), any "tried and failed" medications. Please call [phone], thanks.

## 2016-02-02 NOTE — Telephone Encounter (Signed)
New message      Final call to get prior auth on entresto.  If they do not hear back from [place] soon, claim will be denied.  Please call

## 2016-02-05 NOTE — Telephone Encounter (Signed)
paperwork for entresto PA faxed to [phone].

## 2016-02-06 NOTE — Telephone Encounter (Signed)
Attempted to call back Vicente Males at [place]- contact # left was the main # for [employer]- no option given for call back. Will close encounter at this time.

## 2016-02-09 ENCOUNTER — Encounter: Payer: Self-pay | Admitting: *Deleted

## 2016-02-09 NOTE — Telephone Encounter (Signed)
Pa for entresto denied. Left message for return call of fax number to fax appeal letter to.

## 2016-02-09 NOTE — Telephone Encounter (Signed)
  This encounter was created in error - please disregard.

## 2016-02-19 ENCOUNTER — Telehealth: Payer: Self-pay | Admitting: Cardiology

## 2016-02-19 NOTE — Telephone Encounter (Signed)
PA form through cover my meds filled out and sent to plan.

## 2016-02-19 NOTE — Telephone Encounter (Signed)
New Message  Pt voiced debbie from [facility] had denied entresto due to error we made, will not refill entrestro until we call [employer] or fax for an appeal.  Pt voiced currently he is out of this medication and not able to take it.  Pt voiced please expedite.

## 2016-02-21 NOTE — Telephone Encounter (Signed)
Appeal letter and office note from 05-11-15, when entresto was started faxed to [phone].

## 2016-02-21 NOTE — Telephone Encounter (Signed)
PA cancelled per cover my meds. Left message for pt to call, he has the number for appeals.

## 2016-02-22 ENCOUNTER — Telehealth: Payer: Self-pay | Admitting: Internal Medicine

## 2016-02-22 NOTE — Telephone Encounter (Signed)
Follow up   [place] repeals department is calling to [place] that appeal was received for pt on Entresto. She states it would be a 7 day turn around time.   [phone], if any more information needs to be faxed over.

## 2016-02-22 NOTE — Telephone Encounter (Signed)
New Message    [name] is calling from [facility] for more information about medication, [name]. She has further question for this.

## 2016-02-22 NOTE — Telephone Encounter (Signed)
I left a message for [name] to call.

## 2016-02-26 ENCOUNTER — Telehealth: Payer: Self-pay | Admitting: Cardiology

## 2016-02-26 ENCOUNTER — Telehealth: Payer: Self-pay | Admitting: *Deleted

## 2016-02-26 NOTE — Telephone Encounter (Signed)
New message   [name] from [place] is calling needing confirmation on pt for his medication appeal for Entresto. She needs to know if pt has diagnosis of class 2,3, or 4 chronic heart failure with a baseline or current left ventricular ejection fraction blocker and is not currently taking an ace inhibitor or ARB drug.   She ask if she does not answer, could you leave a voicemail-it is her direct number?

## 2016-02-26 NOTE — Telephone Encounter (Signed)
Appeal form filled out and office notes faxed again to the number provided. Left a message for wendy at [facility] to give me a call today or wednesdayu.

## 2016-02-26 NOTE — Telephone Encounter (Signed)
Left message for [name], patient is class 2, EF 20-25% on echo 01-2015. He was no valsartan prior to entresto. She will call back with further questions.

## 2016-02-27 ENCOUNTER — Telehealth: Payer: Self-pay | Admitting: Cardiology

## 2016-02-27 NOTE — Telephone Encounter (Signed)
I left a message for the [name] to call.

## 2016-02-27 NOTE — Telephone Encounter (Signed)
New Message    This is for [name] from [name] _0  Following up with you , what other notes do you need?  She received the appeal letter for the [facility] on 02/21/16 please call to follow up

## 2016-02-27 NOTE — Telephone Encounter (Signed)
[id number]  For [employer] @ [employer]

## 2016-02-29 NOTE — Telephone Encounter (Signed)
According to [employer] has been approved.

## 2016-03-01 NOTE — Telephone Encounter (Signed)
Per [employer] entresto has been approved.

## 2016-03-05 ENCOUNTER — Ambulatory Visit (INDEPENDENT_AMBULATORY_CARE_PROVIDER_SITE_OTHER): Payer: Medicare Other | Admitting: Gastroenterology

## 2016-03-05 ENCOUNTER — Encounter: Payer: Self-pay | Admitting: Gastroenterology

## 2016-03-05 VITALS — BP 106/70 | HR 64 | Ht 70.0 in | Wt 210.0 lb

## 2016-03-05 DIAGNOSIS — K641 Second degree hemorrhoids: Secondary | ICD-10-CM

## 2016-03-05 NOTE — Patient Instructions (Addendum)
HEMORRHOID BANDING PROCEDURE    FOLLOW-UP CARE   1. The procedure you have had should have been relatively painless since the banding of the area involved does not have nerve endings and there is no pain sensation.  The rubber band cuts off the blood supply to the hemorrhoid and the band may fall off as soon as 48 hours after the banding (the band may occasionally be seen in the toilet bowl following a bowel movement). You may notice a temporary feeling of fullness in the rectum which should respond adequately to plain Tylenol or Motrin.  2. Following the banding, avoid strenuous exercise that evening and resume full activity the next day.  A sitz bath (soaking in a warm tub) or bidet is soothing, and can be useful for cleansing the area after bowel movements.     3. To avoid constipation, take two tablespoons of natural wheat bran, natural oat bran, flax, Benefiber or any over the counter fiber supplement and increase your water intake to 7-8 glasses daily.    4. Unless you have been prescribed anorectal medication, do not put anything inside your rectum for two weeks: No suppositories, enemas, fingers, etc.  5. Occasionally, you may have more bleeding than usual after the banding procedure.  This is often from the untreated hemorrhoids rather than the treated one.  Don't be concerned if there is a tablespoon or so of blood.  If there is more blood than this, lie flat with your bottom higher than your head and apply an ice pack to the area. If the bleeding does not stop within a half an hour or if you feel faint, call our office at [phone] or go to the emergency room.  6. Problems are not common; however, if there is a substantial amount of bleeding, severe pain, chills, fever or difficulty passing urine (very rare) or other problems, you should call us at [phone] or report to the nearest emergency room.  7. Do not stay seated continuously for more than 2-3 hours for a day or two  after the procedure.  Tighten your buttock muscles 10-15 times every two hours and take 10-15 deep breaths every 1-2 hours.  Do not spend more than a few minutes on the toilet if you cannot empty your bowel; instead re-visit the toilet at a later time.   You will start back Xarelto tomorrow

## 2016-03-05 NOTE — Progress Notes (Signed)
PROCEDURE NOTE: The patient presents with symptomatic grade II  hemorrhoids, requesting rubber band ligation of his/her hemorrhoidal disease.  All risks, benefits and alternative forms of therapy were described and informed consent was obtained.   The anorectum was pre-medicated with 0.125% Nitroglycerine and recticare The decision was made to band the R posterior internal hemorrhoid, and the [place] was used to perform band ligation without complication.  Digital anorectal examination was then performed to assure proper positioning of the band, and to adjust the banded tissue as required.  The patient was discharged home without pain or other issues.  Dietary and behavioral recommendations were given and along with follow-up instructions.     The following adjunctive treatments were recommended:  Restart Xarelto tomorrow  The patient will return in 2-4 weeks for  follow-up and possible additional banding as required. No complications were encountered and the patient tolerated the procedure well.  Damaris Hippo , MD [phone] Mon-Fri 8a-5p [phone] after 5p, weekends, holidays

## 2016-03-06 ENCOUNTER — Encounter: Payer: Self-pay | Admitting: Gastroenterology

## 2016-03-19 ENCOUNTER — Encounter: Payer: Self-pay | Admitting: Physician Assistant

## 2016-03-19 ENCOUNTER — Ambulatory Visit (INDEPENDENT_AMBULATORY_CARE_PROVIDER_SITE_OTHER): Payer: Medicare Other | Admitting: Physician Assistant

## 2016-03-19 ENCOUNTER — Ambulatory Visit (HOSPITAL_BASED_OUTPATIENT_CLINIC_OR_DEPARTMENT_OTHER)
Admission: RE | Admit: 2016-03-19 | Discharge: 2016-03-19 | Disposition: A | Discharge status: Home / Self Care | Payer: Medicare Other | Source: Ambulatory Visit | Attending: Emergency Medicine | Admitting: Emergency Medicine

## 2016-03-19 VITALS — BP 118/68 | HR 60 | Temp 97.7°F | Resp 14 | Ht 70.0 in | Wt 218.0 lb

## 2016-03-19 DIAGNOSIS — S70261A Insect bite (nonvenomous), right hip, initial encounter: Secondary | ICD-10-CM | POA: Diagnosis not present

## 2016-03-19 DIAGNOSIS — R911 Solitary pulmonary nodule: Secondary | ICD-10-CM

## 2016-03-19 DIAGNOSIS — W57XXXA Bitten or stung by nonvenomous insect and other nonvenomous arthropods, initial encounter: Secondary | ICD-10-CM

## 2016-03-19 DIAGNOSIS — J439 Emphysema, unspecified: Secondary | ICD-10-CM | POA: Diagnosis not present

## 2016-03-19 MED ORDER — DOXYCYCLINE HYCLATE 100 MG PO CAPS: ORAL_CAPSULE | ORAL | 0 refills | Status: DC

## 2016-03-19 NOTE — Patient Instructions (Signed)
Please take preventative dose of Doxycycline. Please keep skin clean and dry. You have a local reaction from bite and trauma during removal. No sign of cellulitis or a tick borne illness.  The area will resolve over the next 1-2 weeks.   Read information below. If you note any of the following please come see [place] in office.   Tick Bite Information Introduction Ticks are insects that attach themselves to the skin. There are many types of ticks. Common types include wood ticks and deer ticks. Sometimes, ticks carry diseases that can make a person very ill. The most common places for ticks to attach themselves are the scalp, neck, armpits, waist, and groin. HOW CAN YOU PREVENT TICK BITES? Take these steps to help prevent tick bites when you are outdoors:  Wear long sleeves and long pants.  Wear white clothes so you can see ticks more easily.  Tuck your pant legs into your socks.  If walking on a trail, stay in the middle of the trail to avoid brushing against bushes.  Avoid walking through areas with long grass.  Put bug spray on all skin that is showing and along boot tops, pant legs, and sleeve cuffs.  Check clothes, hair, and skin often and before going inside.  Brush off any ticks that are not attached.  Take a shower or bath as soon as possible after being outdoors. HOW SHOULD YOU REMOVE A TICK? Ticks should be removed as soon as possible to help prevent diseases. 1. If latex gloves are available, put them on before trying to remove a tick. 2. Use tweezers to grasp the tick as close to the skin as possible. You may also use curved forceps or a tick removal tool. Grasp the tick as close to its head as possible. Avoid grasping the tick on its body. 3. Pull gently upward until the tick lets go. Do not twist the tick or jerk it suddenly. This may break off the tick's head or mouth parts. 4. Do not squeeze or crush the tick's body. This could force disease-carrying fluids from the  tick into your body. 5. After the tick is removed, wash the bite area and your hands with soap and water or alcohol. 6. Apply a small amount of antiseptic cream or ointment to the bite site. 7. Wash any tools that were used. Do not try to remove a tick by applying a hot match, petroleum jelly, or fingernail polish to the tick. These methods do not work. They may also increase the chances of disease being spread from the tick bite. WHEN SHOULD YOU SEEK HELP? Contact your health care provider if you are unable to remove a tick or if a part of the tick breaks off in the skin. After a tick bite, you need to watch for signs and symptoms of diseases that can be spread by ticks. Contact your health care provider if you develop any of the following:  Fever.  Rash.  Redness and puffiness (swelling) in the area of the tick bite.  Tender, puffy lymph glands.  Watery poop (diarrhea).  Weight loss.  Cough.  Feeling more tired than normal (fatigue).  Muscle, joint, or bone pain.  Belly (abdominal) pain.  Headache.  Change in your level of consciousness.  Trouble walking or moving your legs.  Loss of feeling (numbness) in the legs.  Loss of movement (paralysis).  Shortness of breath.  Confusion.  Throwing up (vomiting) many times. This information is not intended to replace advice  given to you by your health care provider. Make sure you discuss any questions you have with your health care provider. Document Released: 03/27/2009 Document Revised: 06/08/2015 Document Reviewed: 06/10/2012 Elsevier Interactive Patient Education  2017 [employer].

## 2016-03-19 NOTE — Progress Notes (Signed)
Pre visit review using our clinic review tool, if applicable. No additional management support is needed unless otherwise documented below in the visit note

## 2016-03-19 NOTE — Progress Notes (Signed)
Pt presents to clinic today c/o tick bite. Pt states he first noticed tick last night, reports that the head was imbedded. Pt states he does not believe the tick was embedded for longer than 24-48 hours, but he cannot say for certain. Tick bite is located on right pelvis. Pt reports pain around the area with some erythema.. Pt states he gets tick bites approximately 4-5 times a year, but has never had a reaction like this. Reports he was outside yesterday throwing fertilizer and working outside, [place] Has been applying Neosporin around the area with minimal relief. Denies fever, arthralgia, N/V, chills, myalgia, fatigue, chest pain, palpitations, diarrhea, constipation, hematchezia.   Past Medical History:  Diagnosis Date  . Adenomatous colon polyp 2009; 06/2012   Colonoscopy 07/2007---Dr. Barron Schmid GI--Repeat 06/2012 showed one tubular adenoma w/out high grade dysplasia.  [name] AICD (automatic cardioverter/defibrillator) present    [employer]  . Atrial fibrillation ([place])   . Bilateral sensorineural hearing loss   . BPH (benign prostatic hypertrophy)    PSAs ok (followed by urologist): no LUTS  . CAD (coronary artery disease)    PTCA RCA 1997 (Inf/post MI);  . CHF (congestive heart failure) ([place])   . Chronic constipation   . Chronic rhinitis    ?vasomotor (Dr. Erik Obey)  . COPD (chronic obstructive pulmonary disease) ([place])   . Diverticulosis of colon 2005   Noted on Colonoscopies in 2005 and 2009 and on CT 2010  . Gallstones 06/2014   No evidence of cholecystitis on abd u/s 06/2014  . GERD (gastroesophageal reflux disease)   . Hepatic steatosis 2010; 2016   Noted on CT abd 2010 and on u/s abd 04/2009 (transaminasemia) and u/s 06/2014.  [name] Hyperlipidemia   . Hypertension   . Ischemic cardiomyopathy   . Microscopic hematuria    Cysto neg 02/2009 except for BPH-likely has microhem from his renal stone dz  . Nephrolithiasis    No distal stones on CT 2010; stable  8 mm RLP stone, 77m LMP stone 10/2013 plain film.  Abd u/s 06/2014: 9 mm nonobstructing lower pole right renal stone.  03/2015 urol: stable nonobstructing renal calc by CT 12/2014 and KUB 03/2015.  .Marland KitchenObstructive sleep apnea    no cpap uses since weight loss of 50 pounds  . Presence of permanent cardiac pacemaker    [employer] . Squamous cell carcinoma of back   . Ventricular tachycardia ([facility]     Current Outpatient Prescriptions on File Prior to Visit  Medication Sig Dispense Refill  . albuterol (VENTOLIN HFA) 108 (90 Base) MCG/ACT inhaler Inhale 1-2 puffs into the lungs every 4 (four) hours as needed for wheezing or shortness of breath. 1 Inhaler 2  . amiodarone (PACERONE) 200 MG tablet TAKE 1 TABLET(200 MG) BY MOUTH DAILY 90 tablet 3  . antiseptic oral rinse (BIOTENE) LIQD 15 mLs by Mouth Rinse route as needed for dry mouth (TID).    .Marland Kitchenbudesonide-formoterol (SYMBICORT) 160-4.5 MCG/ACT inhaler Inhale 2 puffs into the lungs 2 (two) times daily. 1 Inhaler 5  . carvedilol (COREG) 12.5 MG tablet TAKE 1 TABLET BY MOUTH TWICE DAILY WITH A MEAL 180 tablet 3  . cholecalciferol (VITAMIN D) 1000 units tablet Take 1,000 Units by mouth daily.    .Marland KitchenENTRESTO 24-26 MG TAKE 1 TABLET BY MOUTH TWICE DAILY 60 tablet 10  . eplerenone (INSPRA) 25 MG tablet TAKE 1 TABLET(25 MG) BY MOUTH DAILY 90 tablet 3  . furosemide (LASIX)  40 MG tablet TAKE 2 TABLETS(80 MG) BY MOUTH DAILY 180 tablet 1  . hydrocortisone (ANUSOL-HC) 25 MG suppository Place 1 suppository (25 mg total) rectally every 12 (twelve) hours. 12 suppository 1  . ipratropium (ATROVENT) 0.06 % nasal spray Place 2 sprays into the nose 4 (four) times daily. 15 mL 6  . loratadine (CLARITIN) 10 MG tablet Take 10 mg by mouth daily.    . Magnesium 250 MG TABS Take 1 tablet by mouth daily.    [name] mexiletine (MEXITIL) 150 MG capsule Take 2 capsules (300 mg total) by mouth 2 (two) times daily. 360 capsule 3  . Multiple Vitamin (MULTIVITAMIN WITH MINERALS) TABS  Take 1 tablet by mouth daily.    . nitroGLYCERIN (NITROSTAT) 0.4 MG SL tablet Place 1 tablet (0.4 mg total) under the tongue every 5 (five) minutes as needed for chest pain. 25 tablet 3  . omeprazole (PRILOSEC) 20 MG capsule Take 20 mg by mouth daily.    . polyethylene glycol (MIRALAX / GLYCOLAX) packet Take 17 g by mouth daily.    . polyvinyl alcohol-povidone (REFRESH) 1.4-0.6 % ophthalmic solution Place 1-2 drops into both eyes as needed (dry eyes).     . potassium chloride SA (K-DUR,KLOR-CON) 20 MEQ tablet TAKE 1 TABLET BY MOUTH EVERY DAY 90 tablet 1  . Probiotic Product (PROBIOTIC DAILY PO) Take 1 tablet by mouth daily.    . Psyllium (METAMUCIL PO) Take by mouth 2 (two) times daily. Take 3 teaspoons by mouth 2 times daily    . triamcinolone cream (KENALOG) 0.1 % Apply topically as needed.  2  . XARELTO 20 MG TABS tablet TAKE 1 TABLET(20 MG) BY MOUTH DAILY WITH DINNER 30 tablet 5   No current facility-administered medications on file prior to visit.     Allergies  Allergen Reactions  . Sulfa Antibiotics Rash  . Crestor [Rosuvastatin]     Family History  Problem Relation Age of Onset  . Heart disease Mother   . Other Mother     colon surgery for fistula  . Gallstones Mother   . Breast cancer Maternal Grandmother   . Coronary artery disease Father     family hx of  . Heart attack Father     4 stents/ pacemaker  . Nephrolithiasis Father   . Hypertension Sister   . Goiter Paternal Grandmother   . Nephrolithiasis Son   . Colon cancer Neg Hx     Social History   Social History  . Marital status: Married    Spouse name: N/A  . Number of children: 3  . Years of education: N/A   Occupational History  . Retired Retired   Social History Main Topics  . Smoking status: Former Smoker    Packs/day: 2.00    Years: 15.00    Types: Cigarettes    Quit date: 01/15/1980  . Smokeless tobacco: Never Used  . Alcohol use No  . Drug use: No  . Sexual activity: Not Asked   Other  Topics Concern  . None   Social History Narrative   Married.   Hx of cigarettes: quit in the 1980s.  No alc/drugs.   Review of Systems - See HPI.  All other ROS are negative.  BP 118/68   Pulse 60   Temp 97.7 F (36.5 C) (Oral)   Resp 14   Ht _0  (1.778 m)   Wt 218 lb (98.9 kg)   SpO2 97%   BMI 31.28 kg/m   Physical Exam  Constitutional: He is oriented to person, place, and time and well-developed, well-nourished, and in no distress.  HENT:  Head: Normocephalic and atraumatic.  Eyes: Conjunctivae are normal.  Neck: Neck supple.  Cardiovascular: Normal rate, regular rhythm, normal heart sounds and intact distal pulses.   Pulmonary/Chest: Effort normal and breath sounds normal. No respiratory distress. He has no wheezes. He has no rales. He exhibits no tenderness.  Neurological: He is alert and oriented to person, place, and time.  Skin: Skin is warm and dry. No rash noted.     Psychiatric: Affect normal.  Vitals reviewed.  Recent Results (from the past 2160 hour(s))  Implantable device check     Status: None   Collection Time: 01/03/16  9:14 PM  Result Value Ref Range   Pulse Generator Manufacturer BOST    Date Time Interrogation Session [phone]    Pulse Gen Model N119 COGNIS 100-D    Pulse Gen Serial Number [id number]    Clinic Name [facility]    Implantable Pulse Generator Type Cardiac Resynch Therapy Defibulator    Implantable Pulse Generator Implant Date 72536644    Implantable Lead Manufacturer [facility]    Implantable Lead Model 4194    Implantable Lead Serial Number [id number]    Implantable Lead Implant Date 63875643    Implantable Lead Location Detail 1 Lateral Wall    Implantable Lead Location [id number]    Implantable Lead Manufacturer [employer]    Implantable Lead Model 0148 [employer]otak Reliance    Implantable Lead Serial Number [id number]    Implantable Lead Implant Date [id number]    Implantable Lead Location Detail 1 APEX    Implantable Lead Special Function  Diagnosis:  Sustained monomorphic VT    Implantable Lead Location [id number]    Implantable Lead Manufacturer MERM    Implantable Lead Model 6940 CapSureFix    Implantable Lead Serial Number [id number]    Implantable Lead Implant Date 16606301    Implantable Lead Location Detail 1 APPENDAGE    Implantable Lead Special Function Diagnosis:  Sustained monomorphic VT    Implantable Lead Location [phone]    Lead Channel Setting Sensing Sensitivity 0.5 mV   Lead Channel Setting Sensing Adaptation Mode Adaptive Sensing    Lead Channel Setting Sensing Sensitivity 1.0 mV   Lead Channel Setting Sensing Adaptation Mode Adaptive Sensing    Lead Channel Setting Pacing Amplitude 2.0 V   Lead Channel Setting Pacing Pulse Width 0.4 ms   Lead Channel Setting Pacing Amplitude 2.5 V   Lead Channel Setting Pacing Pulse Width 1.0 ms   Lead Channel Setting Pacing Amplitude 2.5 V   Lead Channel Impedance Value 600 ohm   Lead Channel Sensing Intrinsic Amplitude 6.0 mV   Lead Channel Pacing Threshold Amplitude 0.7 V   Lead Channel Pacing Threshold Pulse Width 0.3 ms   Lead Channel Impedance Value 680 ohm   Lead Channel Pacing Threshold Amplitude 1.2 V   Lead Channel Pacing Threshold Pulse Width 0.4 ms   HighPow Impedance 53 ohm   Lead Channel Impedance Value 922 ohm   Lead Channel Pacing Threshold Amplitude 1.9 V   Lead Channel Pacing Threshold Pulse Width 1.0 ms   Brady Statistic RA Percent Paced 100 %   Brady Statistic RV Percent Paced 100 %   Eval Rhythm CHB     Assessment/Plan: 1. Tick bite of hip, right, initial encounter Local reaction to bite and trauma from removal. No evidence of cellulitis. No evidence of tick-borne illness. Tick present few days maximum. No  indication for lab testing. Patient with concerns of tick-borne illness. Reassurance given. Discussed typical symptoms of illness and cellulitis secondary to bites in general. Will give one dose of doxycycline for prevention of tick-borne illness  giving patient concerns.   Leeanne Rio, PA-C

## 2016-03-26 ENCOUNTER — Ambulatory Visit (INDEPENDENT_AMBULATORY_CARE_PROVIDER_SITE_OTHER): Payer: Medicare Other | Admitting: Emergency Medicine

## 2016-03-26 ENCOUNTER — Telehealth: Payer: Self-pay | Admitting: Emergency Medicine

## 2016-03-26 ENCOUNTER — Encounter: Payer: Self-pay | Admitting: Emergency Medicine

## 2016-03-26 DIAGNOSIS — J849 Interstitial pulmonary disease, unspecified: Secondary | ICD-10-CM | POA: Diagnosis not present

## 2016-03-26 MED ORDER — AZITHROMYCIN 250 MG PO TABS: 250.0000 mg | ORAL_TABLET | Freq: Every day | ORAL | 0 refills | Status: DC

## 2016-03-26 MED ORDER — FLUTICASONE PROPIONATE 50 MCG/ACT NA SUSP: 2.0000 | Freq: Every day | NASAL | 2 refills | Status: DC

## 2016-03-26 NOTE — Progress Notes (Signed)
Subjective:    Patient ID: [name], male    DOB: 01-Dec-1940, 76 y.o.   MRN: [id number]  HPI 76 year old former smoker with a history of hypertension, atrial fibrillation on amiodarone, coronary disease with ischemic cardiomyopathy with ICD/pacer, formerly followed by Dr Gwenette Greet for obstructive sleep apnea on CPAP 16 cm water. He stopped using CPAP about 2 years ago due to dryness. He has tried multiple masks and humidity, but has been unable to tolerate. He is fatigued, is up at night freq to urinate. He underwent PFT that I have reviewed, shows mild mixed disease, restrictive disease on volumes, decreased diffusion capacity that corects for Va.  He describes cough that is likely related to chronic rhintis, partially treated GERD. He recently changed from flonase to ipratropium which has helped. He still coughs up secretions in the am. He was just started on albuterol - seems to benefit from this.          ROV 05/18/15 -- follow-up visit for history of dyspnea in the setting of hypertension, atrial fibrillation on amiodarone, mixed disease on pulmonary function testing, untreated obstructive sleep apnea. He had not been treated with scheduled bronchodilators but had benefited from albuterol and so we started Symbicort which was then substituted with [employer] for his insurance formulary. He was also stared on omeprazole by Dr Erik Obey, changed fluticasone to ipratropium nasal spray > cough and mucous are better. He also underwent a CT scan of the chest on 04/04/15 that person reviewed. This showed evidence for several small pulmonary nodules with some patchy subpleural reticulation and traction bronchiectasis consistent with possible early UIP.  He starts that his cough and dyspnea are significantly improved on the new meds.   ROV 11/14/15 -- This is a follow-up visit for patient with a history of tobacco use and newly diagnosed COPD. He also has a history of obstructive sleep apnea that has not been treated  due to CPAP intolerance. He has chronic rhinitis and is on ipratropium nasal spray. Also treated with GERD for omeprazole. Both of these interventions have helped his chronic cough. He has some very mild interstitial changes with associated nodularity on CT scan of the chest, question related to amiodarone exposure. Repeat CT scan of his chest was done on 09/20/15 that I have personally reviewed. This shows stable right lower lobe and left lower lobe pulmonary nodules. No new nodules were seen. There was no change in his bilateral interstitial disease. No auto-immune eval to date.   ROV 03/26/16 -- this is a follow-up visit for COPD. Also w a hx of chronic rhinitis, GERD-related cough. Stable B lower lobe nodular ILD on CT scan, last on 03/19/16 which I have reviewed >> shows no LAD, stable 6x52m LLL nodule, chronic interstitial changes, no new findings. He has remained on amiodarone. He is on loratadine qd, omeprazole. Has atrovent NS to use prn. He recently stopped nasal washes. On symbicort - feels that it has helped his breathing. Denies fever, chills. Coughing up greenish sputum.  Review of Systems As per HPI   Past Medical History:  Diagnosis Date  . Adenomatous colon polyp 2009; 06/2012   Colonoscopy 07/2007---Dr. Barron Schmid GI--Repeat 06/2012 showed one tubular adenoma w/out high grade dysplasia.  [name] AICD (automatic cardioverter/defibrillator) present    [employer]  . Atrial fibrillation ([place])   . Bilateral  sensorineural hearing loss   . BPH (benign prostatic hypertrophy)    PSAs ok (followed by urologist): no LUTS  . CAD (coronary artery disease)    PTCA RCA 1997 (Inf/post MI);  . CHF (congestive heart failure) ([place])   . Chronic constipation   . Chronic rhinitis    ?vasomotor (Dr. Erik Obey)  . COPD (chronic obstructive pulmonary disease) ([place])   . Diverticulosis of colon 2005   Noted on Colonoscopies in 2005 and 2009 and on CT 2010  . Gallstones 06/2014   No evidence of cholecystitis on abd u/s 06/2014  . GERD (gastroesophageal reflux disease)   . Hepatic steatosis 2010; 2016   Noted on CT abd 2010 and on u/s abd 04/2009 (transaminasemia) and u/s 06/2014.  [name] Hyperlipidemia   . Hypertension   . Ischemic cardiomyopathy   . Microscopic hematuria    Cysto neg 02/2009 except for BPH-likely has microhem from his renal stone dz  . Nephrolithiasis    No distal stones on CT 2010; stable 8 mm RLP stone, 37m LMP stone 10/2013 plain film.  Abd u/s 06/2014: 9 mm nonobstructing lower pole right renal stone.  03/2015 urol: stable nonobstructing renal calc by CT 12/2014 and KUB 03/2015.  .Marland KitchenObstructive sleep apnea    no cpap uses since weight loss of 50 pounds  . Presence of permanent cardiac pacemaker    [employer] . Squamous cell carcinoma of back   . Ventricular tachycardia (HCC)      Family History  Problem Relation Age of Onset  . Heart disease Mother   . Other Mother     colon surgery for fistula  . Gallstones Mother   . Breast cancer Maternal Grandmother   . Coronary artery disease Father     family hx of  . Heart attack Father     4 stents/ pacemaker  . Nephrolithiasis Father   . Hypertension Sister   . Goiter Paternal Grandmother   . Nephrolithiasis Son   . Colon cancer Neg Hx      Social History   Social History  . Marital status: Married    Spouse name: N/A  . Number of children: 3  . Years of education: N/A   Occupational History  . Retired Retired   Social  History Main Topics  . Smoking status: Former Smoker    Packs/day: 2.00    Years: 15.00    Types: Cigarettes    Quit date: 01/15/1980  . Smokeless tobacco: Never Used  . Alcohol use No  . Drug use: No  . Sexual activity: Not on file   Other Topics Concern  . Not on file   Social History Narrative   Married.   Hx of cigarettes: quit in the 1980s.  No alc/drugs.     Allergies  Allergen Reactions  . Sulfa Antibiotics Rash  . Crestor [Rosuvastatin]      Outpatient Medications Prior to Visit  Medication Sig Dispense Refill  . albuterol (VENTOLIN HFA) 108 (90 Base) MCG/ACT inhaler Inhale 1-2 puffs into the lungs every 4 (four) hours as needed for wheezing or shortness of breath. 1 Inhaler 2  . amiodarone (PACERONE)  200 MG tablet TAKE 1 TABLET(200 MG) BY MOUTH DAILY 90 tablet 3  . antiseptic oral rinse (BIOTENE) LIQD 15 mLs by Mouth Rinse route as needed for dry mouth (TID).    [name] budesonide-formoterol (SYMBICORT) 160-4.5 MCG/ACT inhaler Inhale 2 puffs into the lungs 2 (two) times daily. 1 Inhaler 5  . carvedilol (COREG) 12.5 MG tablet TAKE 1 TABLET BY MOUTH TWICE DAILY WITH A MEAL 180 tablet 3  . cholecalciferol (VITAMIN D) 1000 units tablet Take 1,000 Units by mouth daily.    [name] ENTRESTO 24-26 MG TAKE 1 TABLET BY MOUTH TWICE DAILY 60 tablet 10  . eplerenone (INSPRA) 25 MG tablet TAKE 1 TABLET(25 MG) BY MOUTH DAILY 90 tablet 3  . furosemide (LASIX) 40 MG tablet TAKE 2 TABLETS(80 MG) BY MOUTH DAILY 180 tablet 1  . ipratropium (ATROVENT) 0.06 % nasal spray Place 2 sprays into the nose 4 (four) times daily. 15 mL 6  . loratadine (CLARITIN) 10 MG tablet Take 10 mg by mouth daily.    . Magnesium 250 MG TABS Take 1 tablet by mouth daily.    [name] mexiletine (MEXITIL) 150 MG capsule Take 2 capsules (300 mg total) by mouth 2 (two) times daily. 360 capsule 3  . Multiple Vitamin (MULTIVITAMIN WITH MINERALS) TABS Take 1 tablet by mouth daily.    . nitroGLYCERIN (NITROSTAT) 0.4 MG SL tablet Place 1  tablet (0.4 mg total) under the tongue every 5 (five) minutes as needed for chest pain. 25 tablet 3  . omeprazole (PRILOSEC) 20 MG capsule Take 20 mg by mouth daily.    . polyethylene glycol (MIRALAX / GLYCOLAX) packet Take 17 g by mouth daily.    . polyvinyl alcohol-povidone (REFRESH) 1.4-0.6 % ophthalmic solution Place 1-2 drops into both eyes as needed (dry eyes).     . potassium chloride SA (K-DUR,KLOR-CON) 20 MEQ tablet TAKE 1 TABLET BY MOUTH EVERY DAY 90 tablet 1  . Probiotic Product (PROBIOTIC DAILY PO) Take 1 tablet by mouth daily.    . Psyllium (METAMUCIL PO) Take by mouth 2 (two) times daily. Take 3 teaspoons by mouth 2 times daily    . triamcinolone cream (KENALOG) 0.1 % Apply topically as needed.  2  . XARELTO 20 MG TABS tablet TAKE 1 TABLET(20 MG) BY MOUTH DAILY WITH DINNER 30 tablet 5  . doxycycline (VIBRAMYCIN) 100 MG capsule Take 2 tablets by mouth once. 2 capsule 0  . hydrocortisone (ANUSOL-HC) 25 MG suppository Place 1 suppository (25 mg total) rectally every 12 (twelve) hours. 12 suppository 1   No facility-administered medications prior to visit.          Objective:   Physical Exam Vitals:   03/26/16 1143  BP: 112/68  Pulse: 87  SpO2: 96%  Weight: 213 lb 6.4 oz (96.8 kg)  Height: _0  (1.778 m)   Gen: Pleasant, well-nourished, in no distress,  normal affect  ENT: No lesions,  mouth clear,  oropharynx clear, no postnasal drip  Neck: No JVD, no TMG, no carotid bruits  Lungs: No use of accessory muscles, clear without rales or rhonchi  Cardiovascular: RRR, heart sounds normal, no murmur or gallops, no peripheral edema  Musculoskeletal: No deformities, no cyanosis or clubbing  Neuro: alert, non focal  Skin: Warm, no lesions or rashes      Assessment & Plan:  Acute bacterial bronchitis Greenish sputum, no fever chills, he wonders whether abx will help him. Will use azithro, follow progress.   Chronic rhinitis Add back flonase to  loratadine and  atrovent NS  Interstitial lung disease (HCC) Stable nodule, mild interstitial changes. Again, believe we can continue amiodarone for now. Will plan to follow imaging and recheck PFT next year.   Baltazar Apo, MD, PhD 03/26/2016, 12:14 PM [facility] [phone] or if no answer [phone]

## 2016-03-26 NOTE — Telephone Encounter (Signed)
Spoke with a Software engineer at [employer]. There is an interaction between Amiodarone and Zpack. The interaction is QT prolongation.  RB - please advise. Thanks.

## 2016-03-26 NOTE — Patient Instructions (Addendum)
Take azithromycin as directed until completely gone.  Continue omeprazole 31m once a day Restart fluticasone nasal spray, 2 sprays each nostril once a day Continue loratadine 10 mg daily Use ipratropium nasal spray, 2 sprays each nostril 2-3 times a day as needed for nasal congestion Continue Symbicort as you are taking it.  We will repeat your PFT and a CXR next year.  Follow with Dr BLamonte Sakaiin 6 months or sooner if you have any problems

## 2016-03-26 NOTE — Assessment & Plan Note (Signed)
Stable nodule, mild interstitial changes. Again, believe we can continue amiodarone for now. Will plan to follow imaging and recheck PFT next year.

## 2016-03-26 NOTE — Telephone Encounter (Signed)
Spoke with Tiffany at [employer] and notified of recs per RB  She verbalized understanding

## 2016-03-26 NOTE — Telephone Encounter (Signed)
OK to take the azithro while on amiodarone for short term.

## 2016-03-26 NOTE — Assessment & Plan Note (Signed)
Greenish sputum, no fever chills, he wonders whether abx will help him. Will use azithro, follow progress.

## 2016-03-26 NOTE — Assessment & Plan Note (Signed)
Add back flonase to loratadine and atrovent NS

## 2016-03-29 ENCOUNTER — Telehealth: Payer: Self-pay | Admitting: Family Medicine

## 2016-03-29 NOTE — Telephone Encounter (Signed)
Patient calling to report he received Shingrix vaccine at [facility] today.  Please update in chart that he received it.

## 2016-03-29 NOTE — Telephone Encounter (Signed)
Noted, pt chart updated today.

## 2016-04-02 ENCOUNTER — Encounter: Payer: Self-pay | Admitting: Gastroenterology

## 2016-04-02 ENCOUNTER — Ambulatory Visit (INDEPENDENT_AMBULATORY_CARE_PROVIDER_SITE_OTHER): Payer: Medicare Other | Admitting: Gastroenterology

## 2016-04-02 VITALS — BP 104/64 | HR 72 | Ht 69.5 in | Wt 217.0 lb

## 2016-04-02 DIAGNOSIS — K641 Second degree hemorrhoids: Secondary | ICD-10-CM

## 2016-04-02 NOTE — Progress Notes (Signed)
PROCEDURE NOTE: The patient presents with symptomatic grade II  hemorrhoids, requesting rubber band ligation of his/her hemorrhoidal disease.  All risks, benefits and alternative forms of therapy were described and informed consent was obtained.   The anorectum was pre-medicated with 0.125% Nitroglycerine and Recticare The decision was made to band the Left lateral internal hemorrhoid, and the [place] was used to perform band ligation without complication.  Digital anorectal examination was then performed to assure proper positioning of the band, and to adjust the banded tissue as required.  The patient was discharged home without pain or other issues.  Dietary and behavioral recommendations were given and along with follow-up instructions.    The patient will return  for  follow-up and possible additional banding as required. No complications were encountered and the patient tolerated the procedure well.  Damaris Hippo , MD [phone] Mon-Fri 8a-5p [phone] after 5p, weekends, holidays

## 2016-04-02 NOTE — Patient Instructions (Addendum)
HEMORRHOID BANDING PROCEDURE    FOLLOW-UP CARE          RESTART XARELTO TOMORROW 04/03/2016   1. The procedure you have had should have been relatively painless since the banding of the area involved does not have nerve endings and there is no pain sensation.  The rubber band cuts off the blood supply to the hemorrhoid and the band may fall off as soon as 48 hours after the banding (the band may occasionally be seen in the toilet bowl following a bowel movement). You may notice a temporary feeling of fullness in the rectum which should respond adequately to plain Tylenol or Motrin.  2. Following the banding, avoid strenuous exercise that evening and resume full activity the next day.  A sitz bath (soaking in a warm tub) or bidet is soothing, and can be useful for cleansing the area after bowel movements.     3. To avoid constipation, take two tablespoons of natural wheat bran, natural oat bran, flax, Benefiber or any over the counter fiber supplement and increase your water intake to 7-8 glasses daily.    4. Unless you have been prescribed anorectal medication, do not put anything inside your rectum for two weeks: No suppositories, enemas, fingers, etc.  5. Occasionally, you may have more bleeding than usual after the banding procedure.  This is often from the untreated hemorrhoids rather than the treated one.  Don't be concerned if there is a tablespoon or so of blood.  If there is more blood than this, lie flat with your bottom higher than your head and apply an ice pack to the area. If the bleeding does not stop within a half an hour or if you feel faint, call our office at [phone] or go to the emergency room.  6. Problems are not common; however, if there is a substantial amount of bleeding, severe pain, chills, fever or difficulty passing urine (very rare) or other problems, you should call us at [phone] or report to the nearest emergency room.  7. Do not stay seated  continuously for more than 2-3 hours for a day or two after the procedure.  Tighten your buttock muscles 10-15 times every two hours and take 10-15 deep breaths every 1-2 hours.  Do not spend more than a few minutes on the toilet if you cannot empty your bowel; instead re-visit the toilet at a later time.

## 2016-04-03 ENCOUNTER — Ambulatory Visit (INDEPENDENT_AMBULATORY_CARE_PROVIDER_SITE_OTHER): Payer: Medicare Other | Admitting: *Deleted

## 2016-04-03 DIAGNOSIS — I255 Ischemic cardiomyopathy: Secondary | ICD-10-CM | POA: Diagnosis not present

## 2016-04-03 NOTE — Progress Notes (Signed)
Remote ICD transmission.   

## 2016-04-04 ENCOUNTER — Encounter: Payer: Self-pay | Admitting: Cardiology

## 2016-04-04 LAB — CUP PACEART REMOTE DEVICE CHECK
Battery Remaining Percentage: 71 %
Brady Statistic RV Percent Paced: 100 %
HIGH POWER IMPEDANCE MEASURED VALUE: 53 Ohm
Implantable Lead Implant Date: 20131212
Implantable Lead Location: 753858
Implantable Lead Model: 148
Implantable Lead Model: 6940
Lead Channel Pacing Threshold Amplitude: 0.7 V
Lead Channel Pacing Threshold Amplitude: 1.2 V
Lead Channel Pacing Threshold Pulse Width: 0.3 ms
Lead Channel Pacing Threshold Pulse Width: 0.4 ms
Lead Channel Pacing Threshold Pulse Width: 1 ms
Lead Channel Setting Pacing Amplitude: 2 V
Lead Channel Setting Pacing Pulse Width: 0.4 ms
Lead Channel Setting Pacing Pulse Width: 1 ms
MDC IDC LEAD IMPLANT DT: 20020527
MDC IDC LEAD IMPLANT DT: 20020527
MDC IDC LEAD LOCATION: 753859
MDC IDC LEAD LOCATION: 753860
MDC IDC LEAD SERIAL: 120244
MDC IDC MSMT BATTERY REMAINING LONGEVITY: 48 mo
MDC IDC MSMT LEADCHNL LV IMPEDANCE VALUE: 819 Ohm
MDC IDC MSMT LEADCHNL LV PACING THRESHOLD AMPLITUDE: 1.9 V
MDC IDC MSMT LEADCHNL RA IMPEDANCE VALUE: 600 Ohm
MDC IDC MSMT LEADCHNL RV IMPEDANCE VALUE: 621 Ohm
MDC IDC PG IMPLANT DT: 20111207
MDC IDC SESS DTM: 20180321070100
MDC IDC SET LEADCHNL LV PACING AMPLITUDE: 2.5 V
MDC IDC SET LEADCHNL LV SENSING SENSITIVITY: 1 mV
MDC IDC SET LEADCHNL RV PACING AMPLITUDE: 2.5 V
MDC IDC SET LEADCHNL RV SENSING SENSITIVITY: 0.5 mV
MDC IDC STAT BRADY RA PERCENT PACED: 99 %
Pulse Gen Serial Number: 492371

## 2016-04-23 ENCOUNTER — Other Ambulatory Visit: Payer: Self-pay | Admitting: Cardiology

## 2016-04-23 NOTE — Telephone Encounter (Signed)
REFILL 

## 2016-05-05 ENCOUNTER — Other Ambulatory Visit: Payer: Self-pay | Admitting: Cardiology

## 2016-05-07 ENCOUNTER — Encounter: Payer: Self-pay | Admitting: Cardiology

## 2016-05-11 ENCOUNTER — Other Ambulatory Visit: Payer: Self-pay | Admitting: Emergency Medicine

## 2016-05-14 DIAGNOSIS — Z125 Encounter for screening for malignant neoplasm of prostate: Secondary | ICD-10-CM | POA: Diagnosis not present

## 2016-05-16 DIAGNOSIS — L812 Freckles: Secondary | ICD-10-CM | POA: Diagnosis not present

## 2016-05-16 DIAGNOSIS — Z85828 Personal history of other malignant neoplasm of skin: Secondary | ICD-10-CM | POA: Diagnosis not present

## 2016-05-16 DIAGNOSIS — L820 Inflamed seborrheic keratosis: Secondary | ICD-10-CM | POA: Diagnosis not present

## 2016-05-16 DIAGNOSIS — L821 Other seborrheic keratosis: Secondary | ICD-10-CM | POA: Diagnosis not present

## 2016-05-16 DIAGNOSIS — D692 Other nonthrombocytopenic purpura: Secondary | ICD-10-CM | POA: Diagnosis not present

## 2016-05-21 DIAGNOSIS — N200 Calculus of kidney: Secondary | ICD-10-CM | POA: Diagnosis not present

## 2016-05-21 DIAGNOSIS — R3913 Splitting of urinary stream: Secondary | ICD-10-CM | POA: Diagnosis not present

## 2016-05-21 DIAGNOSIS — N401 Enlarged prostate with lower urinary tract symptoms: Secondary | ICD-10-CM | POA: Diagnosis not present

## 2016-05-21 NOTE — Progress Notes (Signed)
HPI: FU history of coronary artery disease, mixed ischemic and nonischemic cardiomyopathy, history of ICD, paroxysmal atrial fibrillation, and ventricular tachycardia. Previous abdominal ultrasound in April 2006 showed no aneurysm. Last cardiac catheterization in October 2008 showed an occluded right coronary artery with left-to-right collateralization. Ejection fraction is 20%. There was no other coronary disease noted. Last Myoview in Dec 2011 showed prior inferior infarct but no ischemia. His ejection fraction was 19%. ABIs in December of 2011 were normal. Previous carotid Dopplers in Sept 2014 showed 0-39% stenosis bilaterally.The patient has had upgrade of his device to CRT-D. Patient had revision of his left ventricular lead in December 2013. Echocardiogram January 2017 showed ejection fraction 12-87%, grade 1 diastolic dysfunction, mild mitral regurgitation, severe left atrial enlargement mild right sided enlargement and mild tricuspid regurgitation. Patient's pulmonary disease is being followed by Dr. Lamonte Sakai. Since last seen, the patient denies any dyspnea on exertion, orthopnea, PND, pedal edema, palpitations, syncope or chest pain.   Current Outpatient Prescriptions  Medication Sig Dispense Refill  . albuterol (VENTOLIN HFA) 108 (90 Base) MCG/ACT inhaler Inhale 1-2 puffs into the lungs every 4 (four) hours as needed for wheezing or shortness of breath. 1 Inhaler 2  . amiodarone (PACERONE) 200 MG tablet TAKE 1 TABLET(200 MG) BY MOUTH DAILY 90 tablet 3  . antiseptic oral rinse (BIOTENE) LIQD 15 mLs by Mouth Rinse route as needed for dry mouth (TID).    [name] budesonide-formoterol (SYMBICORT) 160-4.5 MCG/ACT inhaler Inhale 2 puffs into the lungs 2 (two) times daily. 1 Inhaler 5  . carvedilol (COREG) 12.5 MG tablet TAKE 1 TABLET BY MOUTH TWICE DAILY WITH A MEAL 180 tablet 3  . cholecalciferol (VITAMIN D) 1000 units tablet Take 1,000 Units by mouth daily.    [name] ENTRESTO 24-26 MG TAKE 1 TABLET BY  MOUTH TWICE DAILY 60 tablet 10  . eplerenone (INSPRA) 25 MG tablet TAKE 1 TABLET(25 MG) BY MOUTH DAILY 90 tablet 3  . fluticasone (FLONASE) 50 MCG/ACT nasal spray Place 2 sprays into both nostrils daily. 16 g 2  . furosemide (LASIX) 40 MG tablet TAKE 2 TABLETS(80 MG) BY MOUTH DAILY 180 tablet 1  . ipratropium (ATROVENT) 0.06 % nasal spray Place 2 sprays into the nose 4 (four) times daily. 15 mL 6  . loratadine (CLARITIN) 10 MG tablet Take 10 mg by mouth daily.    . Magnesium 250 MG TABS Take 1 tablet by mouth daily.    [name] mexiletine (MEXITIL) 150 MG capsule Take 2 capsules (300 mg total) by mouth 2 (two) times daily. 360 capsule 3  . Multiple Vitamin (MULTIVITAMIN WITH MINERALS) TABS Take 1 tablet by mouth daily.    . nitroGLYCERIN (NITROSTAT) 0.4 MG SL tablet Place 1 tablet (0.4 mg total) under the tongue every 5 (five) minutes as needed for chest pain. 25 tablet 3  . omeprazole (PRILOSEC) 20 MG capsule Take 20 mg by mouth daily.    . polyethylene glycol (MIRALAX / GLYCOLAX) packet Take 17 g by mouth daily.    . polyvinyl alcohol-povidone (REFRESH) 1.4-0.6 % ophthalmic solution Place 1-2 drops into both eyes as needed (dry eyes).     . potassium chloride SA (K-DUR,KLOR-CON) 20 MEQ tablet TAKE 1 TABLET BY MOUTH EVERY DAY 90 tablet 1  . Probiotic Product (PROBIOTIC DAILY PO) Take 1 tablet by mouth daily.    . Psyllium (METAMUCIL PO) Take by mouth 2 (two) times daily. Take 3 teaspoons by mouth 2 times daily    . SYMBICORT  160-4.5 MCG/ACT inhaler INHALE 2 PUFFS INTO THE LUNGS TWICE DAILY 10.2 g 5  . triamcinolone cream (KENALOG) 0.1 % Apply topically as needed.  2  . XARELTO 20 MG TABS tablet TAKE 1 TABLET(20 MG) BY MOUTH DAILY WITH DINNER 90 tablet 1   No current facility-administered medications for this visit.      Past Medical History:  Diagnosis Date  . Adenomatous colon polyp 2009; 06/2012   Colonoscopy 07/2007---Dr. Barron Schmid GI--Repeat 06/2012 showed one tubular adenoma w/out high  grade dysplasia.  [name] AICD (automatic cardioverter/defibrillator) present    [employer]  . Atrial fibrillation ([place])   . Bilateral sensorineural hearing loss   . BPH (benign prostatic hypertrophy)    PSAs ok (followed by urologist): no LUTS  . CAD (coronary artery disease)    PTCA RCA 1997 (Inf/post MI);  . CHF (congestive heart failure) ([place])   . Chronic constipation   . Chronic rhinitis    ?vasomotor (Dr. Erik Obey)  . COPD (chronic obstructive pulmonary disease) ([place])   . Diverticulosis of colon 2005   Noted on Colonoscopies in 2005 and 2009 and on CT 2010  . Gallstones 06/2014   No evidence of cholecystitis on abd u/s 06/2014  . GERD (gastroesophageal reflux disease)   . Hepatic steatosis 2010; 2016   Noted on CT abd 2010 and on u/s abd 04/2009 (transaminasemia) and u/s 06/2014.  [name] Hyperlipidemia   . Hypertension   . Ischemic cardiomyopathy   . Microscopic hematuria    Cysto neg 02/2009 except for BPH-likely has microhem from his renal stone dz  . Nephrolithiasis    No distal stones on CT 2010; stable 8 mm RLP stone, 90m LMP stone 10/2013 plain film.  Abd u/s 06/2014: 9 mm nonobstructing lower pole right renal stone.  03/2015 urol: stable nonobstructing renal calc by CT 12/2014 and KUB 03/2015.  .Marland KitchenObstructive sleep apnea    no cpap uses since weight loss of 50 pounds  . Presence of permanent cardiac pacemaker    [employer] . Squamous cell carcinoma of back   . Ventricular tachycardia ([facility]     Past Surgical History:  Procedure Laterality Date  . ATRIAL ABLATION SURGERY     flutter ablation 2002  . BREAST SURGERY Left 1990   "Tissue removed from breast"  . CARDIAC CATHETERIZATION    . carotid doppler  09/25/12   No signif stenosis  . COLONOSCOPY N/A 07/13/2012   Diverticula ascending colon.  Polyp x 1-recall 5 yrs (Dr. MWatt Climes Procedure: COLONOSCOPY;  Surgeon: MJeryl Columbia MD;  Location: [facility]OSCOPY;  Service: Endoscopy;  Laterality: N/A; polypectomy x 1   .  COLONOSCOPY WITH PROPOFOL N/A 08/18/2015   Procedure: COLONOSCOPY WITH PROPOFOL;  Surgeon: KMauri Pole MD;  Location: [facility] ENDOSCOPY;  Service: Endoscopy;  Laterality: N/A;  . ESOPHAGOGASTRODUODENOSCOPY N/A 07/13/2012   Procedure: ESOPHAGOGASTRODUODENOSCOPY (EGD);  Surgeon: MJeryl Columbia MD;  [facility];  Service: Endoscopy;  Laterality: N/A;  NORMAL  . EXTRACORPOREAL SHOCK WAVE LITHOTRIPSY    . ICD     a) Guidant Contak H170- SVirl Axe MD 07/25/06 (second device) b) Medtronic CRT-D  . INGUINAL HERNIA REPAIR Bilateral 1975  . LEAD REVISION Bilateral 12/26/2011   Procedure: LEAD REVISION;  Surgeon: GEvans Lance MD;  [facility];  Service: Cardiovascular;  Laterality: Bilateral;  . PFTs  02/2015   Mod restriction (interstitial), mod diffusion defect (Dr. YAnnamaria Boots  . precancer area keratsis removed  05/2015  leftf [place] and left arm and right arm and back    Social History   Social History  . Marital status: Married    Spouse name: N/A  . Number of children: 3  . Years of education: N/A   Occupational History  . Retired Retired   Social History Main Topics  . Smoking status: Former Smoker    Packs/day: 2.00    Years: 15.00    Types: Cigarettes    Quit date: 01/15/1980  . Smokeless tobacco: Never Used  . Alcohol use No  . Drug use: No  . Sexual activity: Not on file   Other Topics Concern  . Not on file   Social History Narrative   Married.   Hx of cigarettes: quit in the 1980s.  No alc/drugs.    Family History  Problem Relation Age of Onset  . Heart disease Mother   . Other Mother        colon surgery for fistula  . Gallstones Mother   . Breast cancer Maternal Grandmother   . Coronary artery disease Father        family hx of  . Heart attack Father        4 stents/ pacemaker  . Nephrolithiasis Father   . Hypertension Sister   . Goiter Paternal Grandmother   . Nephrolithiasis Son   . Colon cancer Neg Hx     ROS: no fevers or  chills, productive cough, hemoptysis, dysphasia, odynophagia, melena, hematochezia, dysuria, hematuria, rash, seizure activity, orthopnea, PND, pedal edema, claudication. Remaining systems are negative.  Physical Exam: Well-developed well-nourished in no acute distress.  Skin is warm and dry.  HEENT is normal.  Neck is supple. No bruits Chest is clear to auscultation with normal expansion.  Cardiovascular exam is regular rate and rhythm.  Abdominal exam nontender or distended. No masses palpated. Extremities show no edema. neuro grossly intact  ECG- AV paced rhythm. personally reviewed  A/P  1 coronary artery disease- patient is not on a statin because of increased liver functions. No aspirin given need for anticoagulation.  2 ischemic cardiomyopathy-continue entresto and carvedilol.  3 chronic systolic congestive heart failure-patient remained euvolemic on examination. Continue present dose of diuretics. Check potassium and renal function.  4 ventricular tachycardia-continue carvedilol, mexiletine and amiodarone. Check TSH and liver functions. Pulmonary is following serial CT scans.   5 hypertension-blood pressure is controlled. Continue present medications.  6 paroxysmal atrial fibrillation-continue present medications including xarelto. Check hemoglobin.  7 hyperlipidemia-continue diet. Not on a statin given history of elevated liver functions. We will recheck lipids and liver functions. We will resume statin if liver functions have normalized.   8 prior biventricular ICD-followed by electrophysiology.  Kirk Ruths, MD

## 2016-05-27 ENCOUNTER — Encounter: Payer: Self-pay | Admitting: Cardiology

## 2016-05-27 ENCOUNTER — Ambulatory Visit (INDEPENDENT_AMBULATORY_CARE_PROVIDER_SITE_OTHER): Payer: Medicare Other | Admitting: Cardiology

## 2016-05-27 ENCOUNTER — Other Ambulatory Visit: Payer: Self-pay | Admitting: Cardiology

## 2016-05-27 VITALS — BP 122/78 | HR 60 | Ht 70.0 in | Wt 219.0 lb

## 2016-05-27 DIAGNOSIS — I059 Rheumatic mitral valve disease, unspecified: Secondary | ICD-10-CM

## 2016-05-27 DIAGNOSIS — E7800 Pure hypercholesterolemia, unspecified: Secondary | ICD-10-CM | POA: Diagnosis not present

## 2016-05-27 DIAGNOSIS — I255 Ischemic cardiomyopathy: Secondary | ICD-10-CM | POA: Diagnosis not present

## 2016-05-27 DIAGNOSIS — I480 Paroxysmal atrial fibrillation: Secondary | ICD-10-CM | POA: Diagnosis not present

## 2016-05-27 DIAGNOSIS — I429 Cardiomyopathy, unspecified: Secondary | ICD-10-CM

## 2016-05-27 DIAGNOSIS — Z95810 Presence of automatic (implantable) cardiac defibrillator: Secondary | ICD-10-CM

## 2016-05-27 DIAGNOSIS — I4891 Unspecified atrial fibrillation: Secondary | ICD-10-CM

## 2016-05-27 DIAGNOSIS — I5022 Chronic systolic (congestive) heart failure: Secondary | ICD-10-CM | POA: Diagnosis not present

## 2016-05-27 MED ORDER — POTASSIUM CHLORIDE CRYS ER 20 MEQ PO TBCR: 20.0000 meq | EXTENDED_RELEASE_TABLET | Freq: Every day | ORAL | 3 refills | Status: DC

## 2016-05-27 MED ORDER — CARVEDILOL 12.5 MG PO TABS: ORAL_TABLET | ORAL | 3 refills | Status: DC

## 2016-05-27 MED ORDER — RIVAROXABAN 20 MG PO TABS: ORAL_TABLET | ORAL | 3 refills | Status: DC

## 2016-05-27 MED ORDER — FUROSEMIDE 40 MG PO TABS: ORAL_TABLET | ORAL | 3 refills | Status: DC

## 2016-05-27 MED ORDER — NITROGLYCERIN 0.4 MG SL SUBL: 0.4000 mg | SUBLINGUAL_TABLET | SUBLINGUAL | 12 refills | Status: DC | PRN

## 2016-05-27 MED ORDER — SACUBITRIL-VALSARTAN 24-26 MG PO TABS: 1.0000 | ORAL_TABLET | Freq: Two times a day (BID) | ORAL | 3 refills | Status: DC

## 2016-05-27 MED ORDER — AMIODARONE HCL 200 MG PO TABS: ORAL_TABLET | ORAL | 3 refills | Status: DC

## 2016-05-27 MED ORDER — MEXILETINE HCL 150 MG PO CAPS: 300.0000 mg | ORAL_CAPSULE | Freq: Two times a day (BID) | ORAL | 3 refills | Status: DC

## 2016-05-27 NOTE — Patient Instructions (Signed)
Medication Instructions:   Refill sent to the pharmacy electronically.   Labwork:  Your physician recommends that you HAVE LAB WORK WHEN FASTING   Follow-Up:  Your physician wants you to follow-up in: [place] will receive a reminder letter in the mail two months in advance. If you don't receive a letter, please call our office to schedule the follow-up appointment.   If you need a refill on your cardiac medications before your next appointment, please call your pharmacy.

## 2016-05-29 ENCOUNTER — Other Ambulatory Visit (INDEPENDENT_AMBULATORY_CARE_PROVIDER_SITE_OTHER): Payer: Medicare Other

## 2016-05-29 DIAGNOSIS — I480 Paroxysmal atrial fibrillation: Secondary | ICD-10-CM

## 2016-05-29 DIAGNOSIS — E7800 Pure hypercholesterolemia, unspecified: Secondary | ICD-10-CM

## 2016-05-29 LAB — HEPATIC FUNCTION PANEL
ALBUMIN: 4.1 g/dL (ref 3.5–5.2)
ALT: 30 U/L (ref 0–53)
AST: 29 U/L (ref 0–37)
Alkaline Phosphatase: 63 U/L (ref 39–117)
Bilirubin, Direct: 0.2 mg/dL (ref 0.0–0.3)
TOTAL PROTEIN: 7 g/dL (ref 6.0–8.3)
Total Bilirubin: 0.6 mg/dL (ref 0.2–1.2)

## 2016-05-29 LAB — TSH: TSH: 2.19 u[IU]/mL (ref 0.35–4.50)

## 2016-05-29 LAB — CBC WITH DIFFERENTIAL/PLATELET
BASOS ABS: 0 10*3/uL (ref 0.0–0.1)
Basophils Relative: 0.7 % (ref 0.0–3.0)
EOS ABS: 0.3 10*3/uL (ref 0.0–0.7)
Eosinophils Relative: 4.7 % (ref 0.0–5.0)
HCT: 44.6 % (ref 39.0–52.0)
Hemoglobin: 15.2 g/dL (ref 13.0–17.0)
LYMPHS ABS: 2 10*3/uL (ref 0.7–4.0)
Lymphocytes Relative: 32.2 % (ref 12.0–46.0)
MCHC: 34.1 g/dL (ref 30.0–36.0)
MCV: 103.3 fl — AB (ref 78.0–100.0)
MONO ABS: 0.5 10*3/uL (ref 0.1–1.0)
Monocytes Relative: 8.4 % (ref 3.0–12.0)
NEUTROS ABS: 3.3 10*3/uL (ref 1.4–7.7)
NEUTROS PCT: 54 % (ref 43.0–77.0)
Platelets: 330 10*3/uL (ref 150.0–400.0)
RBC: 4.32 Mil/uL (ref 4.22–5.81)
RDW: 13.7 % (ref 11.5–15.5)
WBC: 6.1 10*3/uL (ref 4.0–10.5)

## 2016-05-29 LAB — LIPID PANEL
CHOLESTEROL: 264 mg/dL — AB (ref 0–200)
HDL: 37.9 mg/dL — ABNORMAL LOW (ref >39.00)
LDL CALC: 191 mg/dL — AB (ref 0–99)
NonHDL: 225.8
TRIGLYCERIDES: 173 mg/dL — AB (ref 0.0–149.0)
Total CHOL/HDL Ratio: 7
VLDL: 34.6 mg/dL (ref 0.0–40.0)

## 2016-05-29 LAB — BASIC METABOLIC PANEL
BUN: 18 mg/dL (ref 6–23)
CHLORIDE: 103 meq/L (ref 96–112)
CO2: 27 meq/L (ref 19–32)
Calcium: 9.3 mg/dL (ref 8.4–10.5)
Creatinine, Ser: 0.94 mg/dL (ref 0.40–1.50)
GFR: 83.23 mL/min (ref >60.00)
GLUCOSE: 112 mg/dL — AB (ref 70–99)
Potassium: 3.9 mEq/L (ref 3.5–5.1)
Sodium: 136 mEq/L (ref 135–145)

## 2016-05-31 ENCOUNTER — Telehealth: Payer: Self-pay | Admitting: *Deleted

## 2016-05-31 MED ORDER — EZETIMIBE 10 MG PO TABS: 10.0000 mg | ORAL_TABLET | Freq: Every day | ORAL | 3 refills | Status: DC

## 2016-05-31 NOTE — Telephone Encounter (Addendum)
-----  Message from [name], MD sent at 05/29/2016  4:18 PM EDT ----- Add crestor 20 mg daily; lipids and liver 4 weeks Barry with pt, he is has not been able to tolerate crestor or other statins. He has zetia on hand and would like to take that instead. He is seeing his medical doctor in about one month and will have his labs checked through them.

## 2016-06-18 ENCOUNTER — Ambulatory Visit: Payer: Medicare Other | Admitting: Emergency Medicine

## 2016-06-24 ENCOUNTER — Other Ambulatory Visit: Payer: Self-pay | Admitting: Emergency Medicine

## 2016-06-25 ENCOUNTER — Encounter: Payer: Self-pay | Admitting: Family Medicine

## 2016-06-25 ENCOUNTER — Ambulatory Visit (INDEPENDENT_AMBULATORY_CARE_PROVIDER_SITE_OTHER): Payer: Medicare Other | Admitting: Family Medicine

## 2016-06-25 VITALS — BP 120/80 | HR 61 | Temp 98.1°F | Resp 16 | Ht 70.0 in | Wt 213.2 lb

## 2016-06-25 DIAGNOSIS — I10 Essential (primary) hypertension: Secondary | ICD-10-CM

## 2016-06-25 DIAGNOSIS — E784 Other hyperlipidemia: Secondary | ICD-10-CM

## 2016-06-25 DIAGNOSIS — E669 Obesity, unspecified: Secondary | ICD-10-CM | POA: Diagnosis not present

## 2016-06-25 DIAGNOSIS — I255 Ischemic cardiomyopathy: Secondary | ICD-10-CM | POA: Diagnosis not present

## 2016-06-25 DIAGNOSIS — E7849 Other hyperlipidemia: Secondary | ICD-10-CM

## 2016-06-25 LAB — LIPID PANEL
CHOL/HDL RATIO: 6
Cholesterol: 225 mg/dL — ABNORMAL HIGH (ref 0–200)
HDL: 37.9 mg/dL — ABNORMAL LOW (ref >39.00)
LDL CALC: 162 mg/dL — AB (ref 0–99)
NONHDL: 187.24
Triglycerides: 126 mg/dL (ref 0.0–149.0)
VLDL: 25.2 mg/dL (ref 0.0–40.0)

## 2016-06-25 LAB — BASIC METABOLIC PANEL
BUN: 16 mg/dL (ref 6–23)
CO2: 29 mEq/L (ref 19–32)
Calcium: 9.3 mg/dL (ref 8.4–10.5)
Chloride: 104 mEq/L (ref 96–112)
Creatinine, Ser: 0.95 mg/dL (ref 0.40–1.50)
GFR: 82.2 mL/min (ref >60.00)
GLUCOSE: 101 mg/dL — AB (ref 70–99)
POTASSIUM: 4 meq/L (ref 3.5–5.1)
SODIUM: 139 meq/L (ref 135–145)

## 2016-06-25 LAB — HEPATIC FUNCTION PANEL
ALBUMIN: 4 g/dL (ref 3.5–5.2)
ALK PHOS: 58 U/L (ref 39–117)
ALT: 30 U/L (ref 0–53)
AST: 24 U/L (ref 0–37)
BILIRUBIN DIRECT: 0.1 mg/dL (ref 0.0–0.3)
BILIRUBIN TOTAL: 0.8 mg/dL (ref 0.2–1.2)
Total Protein: 6.5 g/dL (ref 6.0–8.3)

## 2016-06-25 NOTE — Assessment & Plan Note (Signed)
Recent LDL was well above goal so pt restarted his Zetia.  He has been intolerant to statins in the past.  Dr Stanford Breed has asked that we repeat LFTs and lipid panel today.  Orders entered.  Will follow.

## 2016-06-25 NOTE — Assessment & Plan Note (Signed)
Chronic problem.  Well controlled today.  Asymptomatic.  Reviewed recent labs.  No med changes at this time

## 2016-06-25 NOTE — Assessment & Plan Note (Signed)
Pt has lost 6 lbs since last visit.  Applauded his efforts at healthy diet and regular exercise.  Will continue to follow.

## 2016-06-25 NOTE — Progress Notes (Signed)
   Subjective:    Patient ID: [name], male    DOB: January 10, 1941, 76 y.o.   MRN: [id number]  HPI HTN- chronic problem, on Coreg BID, Lasix, Entresto w/ good control.  Recent Cr 0.94.  No CP, SOB, HAs, visual changes, edema.  Hyperlipidemia- chronic problem, on Zetia daily.  Pt is down 6 lbs since last visit.  Pt had LDL done 5/16 and it was 191.  No abd pain, N/V.  Obesity- ongoing issue.  Pt has lost 6 lbs since last visit.  Pt is trying to get under 200 lbs.  Pt has increased activity recently.   Review of Systems For ROS see HPI     Objective:   Physical Exam  Constitutional: He is oriented to person, place, and time. He appears well-developed and well-nourished. No distress.  HENT:  Head: Normocephalic and atraumatic.  Eyes: Conjunctivae and EOM are normal. Pupils are equal, round, and reactive to light.  Neck: Normal range of motion. Neck supple. No thyromegaly present.  Cardiovascular: Normal rate, regular rhythm, normal heart sounds and intact distal pulses.   No murmur heard. Pulmonary/Chest: Effort normal and breath sounds normal. No respiratory distress.  Abdominal: Soft. Bowel sounds are normal. He exhibits no distension.  Musculoskeletal: He exhibits no edema.  Lymphadenopathy:    He has no cervical adenopathy.  Neurological: He is alert and oriented to person, place, and time. No cranial nerve deficit.  Skin: Skin is warm and dry.  Psychiatric: He has a normal mood and affect. His behavior is normal.  Vitals reviewed.         Assessment & Plan:

## 2016-06-25 NOTE — Progress Notes (Signed)
Pre visit review using our clinic review tool, if applicable. No additional management support is needed unless otherwise documented below in the visit note

## 2016-06-25 NOTE — Patient Instructions (Signed)
Schedule your Medicare Wellness Visit w/ [name] in 6 months and a follow up w/ me around the same time We'll notify you of your lab results and make any changes if needed Keep up the good work on healthy diet and regular exercise- you're doing great! Call with any questions or concerns Have a great summer!!!

## 2016-07-03 ENCOUNTER — Ambulatory Visit (INDEPENDENT_AMBULATORY_CARE_PROVIDER_SITE_OTHER): Payer: Medicare Other | Admitting: *Deleted

## 2016-07-03 DIAGNOSIS — I4729 Other ventricular tachycardia: Secondary | ICD-10-CM

## 2016-07-03 DIAGNOSIS — I472 Ventricular tachycardia: Secondary | ICD-10-CM | POA: Diagnosis not present

## 2016-07-04 NOTE — Progress Notes (Signed)
Remote ICD transmission.   

## 2016-07-05 ENCOUNTER — Encounter: Payer: Self-pay | Admitting: Cardiology

## 2016-07-17 LAB — CUP PACEART REMOTE DEVICE CHECK
Battery Remaining Longevity: 42 mo
Battery Remaining Percentage: 66 %
Date Time Interrogation Session: 20180620070100
HighPow Impedance: 53 Ohm
Implantable Lead Implant Date: 20131212
Implantable Lead Model: 148
Implantable Lead Model: 4194
Implantable Lead Model: 6940
Lead Channel Pacing Threshold Amplitude: 1.9 V
Lead Channel Pacing Threshold Pulse Width: 0.3 ms
Lead Channel Pacing Threshold Pulse Width: 0.4 ms
Lead Channel Pacing Threshold Pulse Width: 1 ms
Lead Channel Setting Pacing Amplitude: 2 V
Lead Channel Setting Pacing Amplitude: 2.5 V
Lead Channel Setting Pacing Pulse Width: 1 ms
Lead Channel Setting Sensing Sensitivity: 0.5 mV
MDC IDC LEAD IMPLANT DT: 20020527
MDC IDC LEAD IMPLANT DT: 20020527
MDC IDC LEAD LOCATION: 753858
MDC IDC LEAD LOCATION: 753859
MDC IDC LEAD LOCATION: 753860
MDC IDC LEAD SERIAL: 120244
MDC IDC MSMT LEADCHNL LV IMPEDANCE VALUE: 897 Ohm
MDC IDC MSMT LEADCHNL RA IMPEDANCE VALUE: 577 Ohm
MDC IDC MSMT LEADCHNL RA PACING THRESHOLD AMPLITUDE: 0.7 V
MDC IDC MSMT LEADCHNL RV IMPEDANCE VALUE: 617 Ohm
MDC IDC MSMT LEADCHNL RV PACING THRESHOLD AMPLITUDE: 1.2 V
MDC IDC PG IMPLANT DT: 20111207
MDC IDC SET LEADCHNL LV SENSING SENSITIVITY: 1 mV
MDC IDC SET LEADCHNL RV PACING AMPLITUDE: 2.5 V
MDC IDC SET LEADCHNL RV PACING PULSEWIDTH: 0.4 ms
MDC IDC STAT BRADY RA PERCENT PACED: 99 %
MDC IDC STAT BRADY RV PERCENT PACED: 100 %
Pulse Gen Serial Number: 492371

## 2016-09-24 ENCOUNTER — Other Ambulatory Visit: Payer: Self-pay | Admitting: Family Medicine

## 2016-09-26 ENCOUNTER — Encounter: Payer: Self-pay | Admitting: Emergency Medicine

## 2016-09-26 ENCOUNTER — Ambulatory Visit (INDEPENDENT_AMBULATORY_CARE_PROVIDER_SITE_OTHER): Payer: Medicare Other | Admitting: Emergency Medicine

## 2016-09-26 DIAGNOSIS — J849 Interstitial pulmonary disease, unspecified: Secondary | ICD-10-CM | POA: Diagnosis not present

## 2016-09-26 DIAGNOSIS — J310 Chronic rhinitis: Secondary | ICD-10-CM

## 2016-09-26 DIAGNOSIS — I255 Ischemic cardiomyopathy: Secondary | ICD-10-CM | POA: Diagnosis not present

## 2016-09-26 MED ORDER — IPRATROPIUM BROMIDE 0.06 % NA SOLN: 2.0000 | Freq: Three times a day (TID) | NASAL | 6 refills | Status: DC

## 2016-09-26 MED ORDER — ALBUTEROL SULFATE HFA 108 (90 BASE) MCG/ACT IN AERS: 1.0000 | INHALATION_SPRAY | RESPIRATORY_TRACT | 2 refills | Status: DC | PRN

## 2016-09-26 NOTE — Patient Instructions (Addendum)
We will continue Symbicort twice a day.  Continue your nasal saline, flonase, loratadine We will add back ipratropium nasal spray, use up to 2-3x a day if needed for drainage.  We will plan to repeat your CT scan chest in March 2018 We will repeat your pulmonary function testing  Follow with Dr Lamonte Sakai in March after the CT scan and PFT to review.

## 2016-09-26 NOTE — Assessment & Plan Note (Signed)
Unclear whether this is related to amiodarone. He remains on it and is clinically unchanged. Repeat imaging and PFT in March 2019.  We will plan to repeat your CT scan chest in March 2018 We will repeat your pulmonary function testing  Follow with Dr Lamonte Sakai in March after the CT scan and PFT to review.

## 2016-09-26 NOTE — Assessment & Plan Note (Signed)
Continue your nasal saline, flonase, loratadine We will add back ipratropium nasal spray, use up to 2-3x a day if needed for drainage.

## 2016-09-26 NOTE — Progress Notes (Signed)
Subjective:    Patient ID: [name], male    DOB: Feb 25, 1940, 76 y.o.   MRN: [id number]  HPI 76 year old former smoker with a history of hypertension, atrial fibrillation on amiodarone, coronary disease with ischemic cardiomyopathy with ICD/pacer, formerly followed by Dr Gwenette Greet for obstructive sleep apnea on CPAP 16 cm water. He stopped using CPAP about 2 years ago due to dryness. He has tried multiple masks and humidity, but has been unable to tolerate. He is fatigued, is up at night freq to urinate. He underwent PFT that I have reviewed, shows mild mixed disease, restrictive disease on volumes, decreased diffusion capacity that corects for Va.  He describes cough that is likely related to chronic rhintis, partially treated GERD. He recently changed from flonase to ipratropium which has helped. He still coughs up secretions in the am. He was just started on albuterol - seems to benefit from this.          ROV 05/18/15 -- follow-up visit for history of dyspnea in the setting of hypertension, atrial fibrillation on amiodarone, mixed disease on pulmonary function testing, untreated obstructive sleep apnea. He had not been treated with scheduled bronchodilators but had benefited from albuterol and so we started Symbicort which was then substituted with [employer] for his insurance formulary. He was also stared on omeprazole by Dr Erik Obey, changed fluticasone to ipratropium nasal spray > cough and mucous are better. He also underwent a CT scan of the chest on 04/04/15 that person reviewed. This showed evidence for several small pulmonary nodules with some patchy subpleural reticulation and traction bronchiectasis consistent with possible early UIP.  He starts that his cough and dyspnea are significantly improved on the new meds.   ROV 11/14/15 -- This is a follow-up visit for patient with a history of tobacco use and newly diagnosed COPD. He also has a history of obstructive sleep apnea that has not been treated  due to CPAP intolerance. He has chronic rhinitis and is on ipratropium nasal spray. Also treated with GERD for omeprazole. Both of these interventions have helped his chronic cough. He has some very mild interstitial changes with associated nodularity on CT scan of the chest, question related to amiodarone exposure. Repeat CT scan of his chest was done on 09/20/15 that I have personally reviewed. This shows stable right lower lobe and left lower lobe pulmonary nodules. No new nodules were seen. There was no change in his bilateral interstitial disease. No auto-immune eval to date.   ROV 03/26/16 -- this is a follow-up visit for COPD. Also w a hx of chronic rhinitis, GERD-related cough. Stable B lower lobe nodular ILD on CT scan, last on 03/19/16 which I have reviewed >> shows no LAD, stable 6x23m LLL nodule, chronic interstitial changes, no new findings. He has remained on amiodarone. He is on loratadine qd, omeprazole. Has atrovent NS to use prn. He recently stopped nasal washes. On symbicort - feels that it has helped his breathing. Denies fever, chills. Coughing up greenish sputum.  ROV 09/26/16 -- patient has a history of COPD, chronic rhinitis, GERD, cough. He also has stable bilateral lower lobe interstitial disease by CT scan of the chest, stable 6 x 8 mm left lower lobe nodule. He reports that he is doing fairly well. He has a lot of nasal gtt, he is on flonase, temporarily stopped ipratropium NS. He feels that lately his gtt is worse - wants to go back on the ipratropium. Remains on loratadine. Rare albuterol use.  Needs ventolin refill.   Remains on amiodarone.                                                                                                                                                                                                                                                 Review of Systems As per HPI   Past Medical History:  Diagnosis Date  . Adenomatous colon polyp 2009; 06/2012   Colonoscopy 07/2007---Dr. Barron Schmid GI--Repeat 06/2012 showed one tubular adenoma w/out high grade dysplasia.  [name] AICD (automatic cardioverter/defibrillator) present    [employer]  . Atrial fibrillation ([place])   . Bilateral sensorineural hearing loss   . BPH (benign prostatic hypertrophy)    PSAs ok (followed by urologist): no LUTS  . CAD (coronary artery disease)    PTCA RCA 1997 (Inf/post MI);  . CHF (congestive heart failure) ([place])   . Chronic constipation   . Chronic rhinitis    ?vasomotor (Dr. Erik Obey)  . COPD (chronic obstructive pulmonary disease) ([place])   . Diverticulosis of colon 2005   Noted on Colonoscopies in 2005 and 2009 and on CT 2010  . Gallstones 06/2014   No evidence of cholecystitis on abd u/s 06/2014  . GERD (gastroesophageal reflux disease)   . Hepatic steatosis 2010; 2016   Noted on CT abd 2010 and on u/s abd 04/2009 (transaminasemia) and u/s 06/2014.  [name] Hyperlipidemia   . Hypertension   . Ischemic cardiomyopathy   . Microscopic hematuria    Cysto neg 02/2009 except for BPH-likely has microhem from his renal stone dz  . Nephrolithiasis    No distal stones on CT 2010; stable 8 mm RLP stone, 45m LMP stone 10/2013 plain film.  Abd u/s 06/2014: 9 mm nonobstructing lower pole right renal stone.  03/2015 urol: stable nonobstructing renal calc by CT 12/2014 and KUB 03/2015.  .Marland KitchenObstructive  sleep apnea    no cpap uses since weight loss of 50 pounds  . Presence of permanent cardiac pacemaker    [employer]  . Squamous cell carcinoma of back   . Ventricular tachycardia (HCC)      Family History  Problem Relation Age of Onset  . Heart disease Mother   . Other Mother        colon surgery for fistula  . Gallstones Mother   . Breast cancer  Maternal Grandmother   . Coronary artery disease Father        family hx of  . Heart attack Father        4 stents/ pacemaker  . Nephrolithiasis Father   . Hypertension Sister   . Goiter Paternal Grandmother   . Nephrolithiasis Son   . Colon cancer Neg Hx      Social History   Social History  . Marital status: Married    Spouse name: N/A  . Number of children: 3  . Years of education: N/A   Occupational History  . Retired Retired   Social History Main Topics  . Smoking status: Former Smoker    Packs/day: 2.00    Years: 15.00    Types: Cigarettes    Quit date: 01/15/1980  . Smokeless tobacco: Never Used  . Alcohol use No  . Drug use: No  . Sexual activity: Not on file   Other Topics Concern  . Not on file   Social History Narrative   Married.   Hx of cigarettes: quit in the 1980s.  No alc/drugs.     Allergies  Allergen Reactions  . Sulfa Antibiotics Rash  . Crestor [Rosuvastatin]      Outpatient Medications Prior to Visit  Medication Sig Dispense Refill  . albuterol (VENTOLIN HFA) 108 (90 Base) MCG/ACT inhaler Inhale 1-2 puffs into the lungs every 4 (four) hours as needed for wheezing or shortness of breath. 1 Inhaler 2  . amiodarone (PACERONE) 200 MG tablet TAKE 1 TABLET(200 MG) BY MOUTH DAILY 90 tablet 3  . antiseptic oral rinse (BIOTENE) LIQD 15 mLs by Mouth Rinse route as needed for dry mouth (TID).    [name] budesonide-formoterol (SYMBICORT) 160-4.5 MCG/ACT inhaler Inhale 2 puffs into the lungs 2 (two) times daily. 1 Inhaler 5  . carvedilol (COREG) 12.5 MG tablet TAKE 1 TABLET BY MOUTH TWICE DAILY WITH A MEAL 180 tablet 3  . cholecalciferol (VITAMIN D) 1000 units tablet Take 1,000 Units by mouth daily.    [name] eplerenone (INSPRA) 25 MG tablet TAKE 1 TABLET(25 MG) BY MOUTH DAILY 90 tablet 3  . fluticasone (FLONASE) 50 MCG/ACT nasal spray SHAKE LIQUID AND USE 2 SPRAYS IN EACH NOSTRIL DAILY 16 g 5  . furosemide (LASIX) 40 MG tablet TAKE 2 TABLETS(80 MG) BY MOUTH DAILY  180 tablet 3  . loratadine (CLARITIN) 10 MG tablet Take 10 mg by mouth daily.    . Magnesium 250 MG TABS Take 1 tablet by mouth daily.    [name] mexiletine (MEXITIL) 150 MG capsule Take 2 capsules (300 mg total) by mouth 2 (two) times daily. 360 capsule 3  . Multiple Vitamin (MULTIVITAMIN WITH MINERALS) TABS Take 1 tablet by mouth daily.    . nitroGLYCERIN (NITROSTAT) 0.4 MG SL tablet DISSOLVE 1 TABLET UNDER THE TONGUE EVERY 5 MINUTES AS NEEDED FOR CHEST PAIN 90 tablet 1  . omeprazole (PRILOSEC) 20 MG capsule Take 20 mg by mouth daily.    . polyethylene glycol (MIRALAX / GLYCOLAX) packet  Take 17 g by mouth daily.    . polyvinyl alcohol-povidone (REFRESH) 1.4-0.6 % ophthalmic solution Place 1-2 drops into both eyes as needed (dry eyes).     . potassium chloride SA (K-DUR,KLOR-CON) 20 MEQ tablet Take 1 tablet (20 mEq total) by mouth daily. 90 tablet 3  . Probiotic Product (PROBIOTIC DAILY PO) Take 1 tablet by mouth daily.    . rivaroxaban (XARELTO) 20 MG TABS tablet TAKE 1 TABLET(20 MG) BY MOUTH DAILY WITH DINNER 90 tablet 3  . sacubitril-valsartan (ENTRESTO) 24-26 MG Take 1 tablet by mouth 2 (two) times daily. 180 tablet 3  . SYMBICORT 160-4.5 MCG/ACT inhaler INHALE 2 PUFFS INTO THE LUNGS TWICE DAILY 10.2 g 5  . ezetimibe (ZETIA) 10 MG tablet Take 1 tablet (10 mg total) by mouth daily. 90 tablet 3   No facility-administered medications prior to visit.          Objective:   Physical Exam Vitals:   09/26/16 1150  BP: 122/76  Pulse: 77  SpO2: 96%  Weight: 215 lb 6.4 oz (97.7 kg)  Height: _0  (1.778 m)   Gen: Pleasant, well-nourished, in no distress,  normal affect  ENT: No lesions,  mouth clear,  oropharynx clear, no postnasal drip  Neck: No JVD, no TMG, no carotid bruits  Lungs: No use of accessory muscles, clear without rales or rhonchi  Cardiovascular: RRR, heart sounds normal, no murmur or gallops, no peripheral edema  Musculoskeletal: No deformities, no cyanosis or  clubbing  Neuro: alert, non focal  Skin: Warm, no lesions or rashes      Assessment & Plan:  COPD (chronic obstructive pulmonary disease) (HCC) We will continue Symbicort twice a day.   Interstitial lung disease ([place]) Unclear whether this is related to amiodarone. He remains on it and is clinically unchanged. Repeat imaging and PFT in March 2019.  We will plan to repeat your CT scan chest in March 2018 We will repeat your pulmonary function testing  Follow with Dr Lamonte Sakai in March after the CT scan and PFT to review.  Chronic rhinitis Continue your nasal saline, flonase, loratadine We will add back ipratropium nasal spray, use up to 2-3x a day if needed for drainage.   Baltazar Apo, MD, PhD 09/26/2016, 12:28 PM [facility] [phone] or if no answer [phone]

## 2016-09-26 NOTE — Assessment & Plan Note (Signed)
We will continue Symbicort twice a day.

## 2016-10-02 ENCOUNTER — Ambulatory Visit (INDEPENDENT_AMBULATORY_CARE_PROVIDER_SITE_OTHER): Payer: Medicare Other | Admitting: *Deleted

## 2016-10-02 DIAGNOSIS — I4729 Other ventricular tachycardia: Secondary | ICD-10-CM

## 2016-10-02 DIAGNOSIS — I472 Ventricular tachycardia: Secondary | ICD-10-CM

## 2016-10-02 NOTE — Progress Notes (Signed)
Remote ICD transmission.   

## 2016-10-03 ENCOUNTER — Telehealth: Payer: Self-pay | Admitting: Emergency Medicine

## 2016-10-03 DIAGNOSIS — D1801 Hemangioma of skin and subcutaneous tissue: Secondary | ICD-10-CM | POA: Diagnosis not present

## 2016-10-03 DIAGNOSIS — Z85828 Personal history of other malignant neoplasm of skin: Secondary | ICD-10-CM | POA: Diagnosis not present

## 2016-10-03 DIAGNOSIS — L821 Other seborrheic keratosis: Secondary | ICD-10-CM | POA: Diagnosis not present

## 2016-10-03 DIAGNOSIS — L812 Freckles: Secondary | ICD-10-CM | POA: Diagnosis not present

## 2016-10-03 DIAGNOSIS — L570 Actinic keratosis: Secondary | ICD-10-CM | POA: Diagnosis not present

## 2016-10-03 DIAGNOSIS — D692 Other nonthrombocytopenic purpura: Secondary | ICD-10-CM | POA: Diagnosis not present

## 2016-10-03 NOTE — Telephone Encounter (Signed)
Lorriane Shire with [facility] is returning phone call from [place]. Lorriane Shire stated she need to know how many per month.

## 2016-10-03 NOTE — Telephone Encounter (Signed)
Received PA on Symbicort. PA was started on Cover My Meds. Key: [id number] Will route to myself for follow up.

## 2016-10-04 ENCOUNTER — Encounter: Payer: Self-pay | Admitting: Cardiology

## 2016-10-04 NOTE — Telephone Encounter (Signed)
[place]ia with [employer] calling stating needing clinical for the medication (Symbicort) to be able to make a coverage decision and this is a 2nd attempt.  CB is [phone], option 5.

## 2016-10-04 NOTE — Telephone Encounter (Signed)
[employer] and spoke with [place]. This claim has been approved. Pt and his pharmacy have already been made aware of this. Nothing further was needed.

## 2016-10-07 DIAGNOSIS — Z23 Encounter for immunization: Secondary | ICD-10-CM | POA: Diagnosis not present

## 2016-10-07 LAB — CUP PACEART REMOTE DEVICE CHECK
Battery Remaining Percentage: 64 %
Brady Statistic RA Percent Paced: 99 %
HighPow Impedance: 54 Ohm
Implantable Lead Implant Date: 20131212
Implantable Lead Location: 753858
Implantable Lead Location: 753860
Implantable Lead Model: 4194
Implantable Lead Model: 6940
Implantable Lead Serial Number: 120244
Lead Channel Impedance Value: 609 Ohm
Lead Channel Impedance Value: 890 Ohm
Lead Channel Pacing Threshold Pulse Width: 0.3 ms
Lead Channel Pacing Threshold Pulse Width: 1 ms
Lead Channel Setting Pacing Amplitude: 2 V
Lead Channel Setting Pacing Amplitude: 2.5 V
Lead Channel Setting Sensing Sensitivity: 1 mV
MDC IDC LEAD IMPLANT DT: 20020527
MDC IDC LEAD IMPLANT DT: 20020527
MDC IDC LEAD LOCATION: 753859
MDC IDC MSMT BATTERY REMAINING LONGEVITY: 42 mo
MDC IDC MSMT LEADCHNL LV PACING THRESHOLD AMPLITUDE: 1.9 V
MDC IDC MSMT LEADCHNL RA PACING THRESHOLD AMPLITUDE: 0.7 V
MDC IDC MSMT LEADCHNL RV IMPEDANCE VALUE: 636 Ohm
MDC IDC MSMT LEADCHNL RV PACING THRESHOLD AMPLITUDE: 1.2 V
MDC IDC MSMT LEADCHNL RV PACING THRESHOLD PULSEWIDTH: 0.4 ms
MDC IDC PG IMPLANT DT: 20111207
MDC IDC SESS DTM: 20180919070100
MDC IDC SET LEADCHNL LV PACING PULSEWIDTH: 1 ms
MDC IDC SET LEADCHNL RV PACING AMPLITUDE: 2.5 V
MDC IDC SET LEADCHNL RV PACING PULSEWIDTH: 0.4 ms
MDC IDC SET LEADCHNL RV SENSING SENSITIVITY: 0.5 mV
MDC IDC STAT BRADY RV PERCENT PACED: 100 %
Pulse Gen Serial Number: 492371

## 2016-10-21 ENCOUNTER — Other Ambulatory Visit: Payer: Self-pay | Admitting: Cardiology

## 2016-10-22 ENCOUNTER — Other Ambulatory Visit: Payer: Self-pay | Admitting: Cardiology

## 2016-10-22 DIAGNOSIS — I429 Cardiomyopathy, unspecified: Secondary | ICD-10-CM

## 2016-10-22 DIAGNOSIS — I059 Rheumatic mitral valve disease, unspecified: Secondary | ICD-10-CM

## 2016-10-22 DIAGNOSIS — Z95810 Presence of automatic (implantable) cardiac defibrillator: Secondary | ICD-10-CM

## 2016-10-22 DIAGNOSIS — I4891 Unspecified atrial fibrillation: Secondary | ICD-10-CM

## 2016-10-22 DIAGNOSIS — I480 Paroxysmal atrial fibrillation: Secondary | ICD-10-CM

## 2016-10-31 ENCOUNTER — Other Ambulatory Visit: Payer: Self-pay | Admitting: Emergency Medicine

## 2016-11-04 ENCOUNTER — Other Ambulatory Visit: Payer: Self-pay | Admitting: Cardiology

## 2016-11-04 DIAGNOSIS — I059 Rheumatic mitral valve disease, unspecified: Secondary | ICD-10-CM

## 2016-11-04 DIAGNOSIS — I480 Paroxysmal atrial fibrillation: Secondary | ICD-10-CM

## 2016-11-04 DIAGNOSIS — I4891 Unspecified atrial fibrillation: Secondary | ICD-10-CM

## 2016-11-04 DIAGNOSIS — Z95810 Presence of automatic (implantable) cardiac defibrillator: Secondary | ICD-10-CM

## 2016-11-04 DIAGNOSIS — I429 Cardiomyopathy, unspecified: Secondary | ICD-10-CM

## 2016-11-13 ENCOUNTER — Other Ambulatory Visit: Payer: Self-pay

## 2016-11-13 DIAGNOSIS — I059 Rheumatic mitral valve disease, unspecified: Secondary | ICD-10-CM

## 2016-11-13 DIAGNOSIS — I480 Paroxysmal atrial fibrillation: Secondary | ICD-10-CM

## 2016-11-13 DIAGNOSIS — Z95810 Presence of automatic (implantable) cardiac defibrillator: Secondary | ICD-10-CM

## 2016-11-13 DIAGNOSIS — I429 Cardiomyopathy, unspecified: Secondary | ICD-10-CM

## 2016-11-13 MED ORDER — MEXILETINE HCL 150 MG PO CAPS: 300.0000 mg | ORAL_CAPSULE | Freq: Two times a day (BID) | ORAL | 1 refills | Status: DC

## 2016-11-25 ENCOUNTER — Other Ambulatory Visit: Payer: Self-pay | Admitting: Family Medicine

## 2016-12-20 ENCOUNTER — Other Ambulatory Visit: Payer: Self-pay | Admitting: Emergency Medicine

## 2016-12-22 ENCOUNTER — Other Ambulatory Visit: Payer: Self-pay | Admitting: Emergency Medicine

## 2016-12-31 DIAGNOSIS — H2513 Age-related nuclear cataract, bilateral: Secondary | ICD-10-CM | POA: Diagnosis not present

## 2017-01-01 ENCOUNTER — Ambulatory Visit: Payer: Medicare Other | Admitting: Family Medicine

## 2017-01-01 ENCOUNTER — Ambulatory Visit (INDEPENDENT_AMBULATORY_CARE_PROVIDER_SITE_OTHER): Payer: Medicare Other | Admitting: *Deleted

## 2017-01-01 DIAGNOSIS — I472 Ventricular tachycardia: Secondary | ICD-10-CM | POA: Diagnosis not present

## 2017-01-01 DIAGNOSIS — I4729 Other ventricular tachycardia: Secondary | ICD-10-CM

## 2017-01-01 NOTE — Progress Notes (Signed)
Remote ICD transmission.   

## 2017-01-02 ENCOUNTER — Encounter: Payer: Self-pay | Admitting: Cardiology

## 2017-01-10 ENCOUNTER — Ambulatory Visit (INDEPENDENT_AMBULATORY_CARE_PROVIDER_SITE_OTHER): Payer: Medicare Other | Admitting: Internal Medicine

## 2017-01-10 ENCOUNTER — Encounter: Payer: Self-pay | Admitting: Internal Medicine

## 2017-01-10 VITALS — BP 108/80 | HR 60 | Ht 70.0 in | Wt 216.4 lb

## 2017-01-10 DIAGNOSIS — I4729 Other ventricular tachycardia: Secondary | ICD-10-CM

## 2017-01-10 DIAGNOSIS — Z95810 Presence of automatic (implantable) cardiac defibrillator: Secondary | ICD-10-CM

## 2017-01-10 DIAGNOSIS — I429 Cardiomyopathy, unspecified: Secondary | ICD-10-CM

## 2017-01-10 DIAGNOSIS — I5022 Chronic systolic (congestive) heart failure: Secondary | ICD-10-CM | POA: Diagnosis not present

## 2017-01-10 DIAGNOSIS — I472 Ventricular tachycardia: Secondary | ICD-10-CM | POA: Diagnosis not present

## 2017-01-10 DIAGNOSIS — I480 Paroxysmal atrial fibrillation: Secondary | ICD-10-CM | POA: Diagnosis not present

## 2017-01-10 DIAGNOSIS — I255 Ischemic cardiomyopathy: Secondary | ICD-10-CM

## 2017-01-10 LAB — CUP PACEART INCLINIC DEVICE CHECK
HighPow Impedance: 38 Ohm
HighPow Impedance: 50 Ohm
Implantable Lead Implant Date: 20020527
Implantable Lead Implant Date: 20131212
Implantable Lead Location: 753858
Implantable Lead Location: 753860
Implantable Lead Model: 148
Implantable Lead Model: 6940
Implantable Pulse Generator Implant Date: 20111207
Lead Channel Impedance Value: 584 Ohm
Lead Channel Impedance Value: 820 Ohm
Lead Channel Pacing Threshold Amplitude: 0.7 V
Lead Channel Pacing Threshold Amplitude: 1 V
Lead Channel Pacing Threshold Amplitude: 1.8 V
Lead Channel Pacing Threshold Pulse Width: 0.3 ms
Lead Channel Pacing Threshold Pulse Width: 0.4 ms
Lead Channel Pacing Threshold Pulse Width: 1 ms
Lead Channel Setting Pacing Amplitude: 2 V
Lead Channel Setting Pacing Amplitude: 2.5 V
Lead Channel Setting Pacing Pulse Width: 1 ms
Lead Channel Setting Sensing Sensitivity: 0.5 mV
Lead Channel Setting Sensing Sensitivity: 1 mV
MDC IDC LEAD IMPLANT DT: 20020527
MDC IDC LEAD LOCATION: 753859
MDC IDC LEAD SERIAL: 120244
MDC IDC MSMT LEADCHNL RA SENSING INTR AMPL: 5.2 mV
MDC IDC MSMT LEADCHNL RV IMPEDANCE VALUE: 651 Ohm
MDC IDC PG SERIAL: 492371
MDC IDC SESS DTM: 20181228050000
MDC IDC SET LEADCHNL LV PACING AMPLITUDE: 2.5 V
MDC IDC SET LEADCHNL RV PACING PULSEWIDTH: 0.4 ms

## 2017-01-10 NOTE — Patient Instructions (Signed)
Medication Instructions: Your physician recommends that you continue on your current medications as directed. Please refer to the Current Medication list given to you today.  Labwork: None Ordered  Procedures/Testing: None Ordered  Follow-Up: Your physician wants you to follow-up in: 1 YEAR with Dr. Caryl Comes. You will receive a reminder letter in the mail two months in advance. If you don't receive a letter, please call our office to schedule the follow-up appointment.  Remote monitoring is used to monitor your ICD from home. This monitoring reduces the number of office visits required to check your device to one time per year. It allows [place] to keep an eye on the functioning of your device to ensure it is working properly. You are scheduled for a device check from home on 04/02/17. You may send your transmission at any time that day. If you have a wireless device, the transmission will be sent automatically. After your physician reviews your transmission, you will receive a postcard with your next transmission date.   If you need a refill on your cardiac medications before your next appointment, please call your pharmacy.

## 2017-01-10 NOTE — Progress Notes (Signed)
Patient Care Team: [name], MD as PCP - General (Family Medicine) Festus Aloe, MD as Consulting Physician (Urology) Michael Boston, MD as Consulting Physician (General Surgery) Stanford Breed Denice Bors, MD as Consulting Physician (Cardiology) Jodi Marble, MD as Consulting Physician (Otolaryngology) Deboraha Sprang, MD as Consulting Physician (Cardiology) Luberta Mutter, MD as Consulting Physician (Ophthalmology) Lamonte Sakai Rose Fillers, MD as Consulting Physician (Pulmonary Disease) Mauri Pole, MD as Consulting Physician (Gastroenterology)   HPI  [name] is a 76 y.o. male Seen in followup for congestive heart failure in the setting of ischemic heart disease and previously implanted CRT-D with subsequent left ventricular lead macro dislodgment. His symptoms however been quite stable. When he saw Dr. Lovena Le concerning lead extraction and lead implantation he was doing as well as he had for some time and it was decided not to pursue it then. Ultimately this was done  2013  He also has ventricular tachycardia for which he takes amiodarone.   required adjunctive mexiletine. LFTs were abnormal and his Crestor was stopped.  Subsequently normalized even on amiodarone.  Was started on Zetia 5/18.  He also has atrial fibrillation and is taking Xarelto. No bleeding issues  He has been well managed with guideline directed therapy currently on Entresto eplernonoe and carvedilol.   Feels better than he has in years  He continues to cough productive with chronic post nasal drip   Pulm notes and CT report reviewed   Repeat testing anticipated 3/19.   CT 3/18 demonstrated emphysema with chronic interstitial changes but no progressive findings  DATE TEST    10/08 Cath   EF 20 % T RCA  12/13 Cath EF<20% T RCA   1/17 Echo   EF 20-25 %             Date Cr K Hgb TSH PLCO LFTs LDL  2008     80%    11/17     1.91 56%(3/17) 31    6/18 0.95 4.0 15.2 2.19  30 162  He  has been intolerant of statins.        Past Medical History:  Diagnosis Date  . Adenomatous colon polyp 2009; 06/2012   Colonoscopy 07/2007---Dr. Barron Schmid GI--Repeat 06/2012 showed one tubular adenoma w/out high grade dysplasia.  [name] AICD (automatic cardioverter/defibrillator) present    [employer]  . Atrial fibrillation ([place])   . Bilateral sensorineural hearing loss   . BPH (benign prostatic hypertrophy)    PSAs ok (followed by urologist): no LUTS  . CAD (coronary artery disease)    PTCA RCA 1997 (Inf/post MI);  . CHF (congestive heart failure) ([place])   . Chronic constipation   . Chronic rhinitis    ?vasomotor (Dr. Erik Obey)  . COPD (chronic obstructive pulmonary disease) ([place])   . Diverticulosis of colon 2005   Noted on Colonoscopies in 2005 and 2009 and on CT 2010  . Gallstones 06/2014   No evidence of cholecystitis on abd u/s 06/2014  . GERD (gastroesophageal reflux disease)   . Hepatic steatosis 2010; 2016   Noted on CT abd 2010 and on u/s abd 04/2009 (transaminasemia) and u/s 06/2014.  [name] Hyperlipidemia   . Hypertension   . Ischemic cardiomyopathy   . Microscopic hematuria    Cysto neg 02/2009 except for BPH-likely has microhem from his renal stone dz  . Nephrolithiasis    No distal stones on CT 2010; stable 8 mm RLP stone, 22m LMP stone 10/2013 plain film.  Abd u/s 06/2014: 9 mm nonobstructing lower pole right renal stone.  03/2015 urol: stable nonobstructing renal calc by CT 12/2014 and KUB 03/2015.  [name] Obstructive sleep apnea    no cpap uses since weight loss of 50 pounds  . Presence of permanent cardiac pacemaker    [employer]  . Squamous cell carcinoma of back   . Ventricular tachycardia [facility])     Past Surgical History:  Procedure Laterality Date  . ATRIAL ABLATION SURGERY     flutter ablation 2002  . BREAST SURGERY Left 1990   "Tissue removed from breast"  . CARDIAC CATHETERIZATION    . carotid doppler  09/25/12   No signif stenosis  . COLONOSCOPY N/A  07/13/2012   Diverticula ascending colon.  Polyp x 1-recall 5 yrs (Dr. Watt Climes) Procedure: COLONOSCOPY;  Surgeon: Jeryl Columbia, MD;  Location: [facility] ENDOSCOPY;  Service: Endoscopy;  Laterality: N/A; polypectomy x 1   . COLONOSCOPY WITH PROPOFOL N/A 08/18/2015   Procedure: COLONOSCOPY WITH PROPOFOL;  Surgeon: Mauri Pole, MD;  Location: [facility] ENDOSCOPY;  Service: Endoscopy;  Laterality: N/A;  . ESOPHAGOGASTRODUODENOSCOPY N/A 07/13/2012   Procedure: ESOPHAGOGASTRODUODENOSCOPY (EGD);  Surgeon: Jeryl Columbia, MD;  Location: [facility];  Service: Endoscopy;  Laterality: N/A;  NORMAL  . EXTRACORPOREAL SHOCK WAVE LITHOTRIPSY    . ICD     a) Guidant Contak H170- Virl Axe, MD 07/25/06 (second device) b) Medtronic CRT-D  . INGUINAL HERNIA REPAIR Bilateral 1975  . LEAD REVISION Bilateral 12/26/2011   Procedure: LEAD REVISION;  Surgeon: Evans Lance, MD;  Location[facility] C[facility] LAB;  Service: Cardiovascular;  Laterality: Bilateral;  . PFTs  02/2015   Mod restriction (interstitial), mod diffusion defect (Dr. Annamaria Boots)  . precancer area keratsis removed  05/2015    leftf orehead and left arm and right arm and back    Current Outpatient Medications  Medication Sig Dispense Refill  . albuterol (VENTOLIN HFA) 108 (90 Base) MCG/ACT inhaler Inhale 1-2 puffs into the lungs every 4 (four) hours as needed for wheezing or shortness of breath. 1 Inhaler 2  . amiodarone (PACERONE) 200 MG tablet TAKE 1 TABLET(200 MG) BY MOUTH DAILY 90 tablet 2  . antiseptic oral rinse (BIOTENE) LIQD 15 mLs by Mouth Rinse route as needed for dry mouth (TID).    [name] budesonide-formoterol (SYMBICORT) 160-4.5 MCG/ACT inhaler Inhale 2 puffs into the lungs 2 (two) times daily. 1 Inhaler 5  . carvedilol (COREG) 12.5 MG tablet TAKE 1 TABLET BY MOUTH TWICE DAILY WITH A MEAL 180 tablet 3  . cholecalciferol (VITAMIN D) 1000 units tablet Take 1,000 Units by mouth daily.    [name] eplerenone (INSPRA) 25 MG tablet TAKE 1 TABLET(25 MG) BY MOUTH DAILY 90 tablet 0   . ezetimibe (ZETIA) 10 MG tablet TAKE 1 TABLET(10 MG) BY MOUTH DAILY 30 tablet 6  . fluticasone (FLONASE) 50 MCG/ACT nasal spray SHAKE LIQUID AND USE 2 SPRAYS IN EACH NOSTRIL DAILY 16 g 5  . furosemide (LASIX) 40 MG tablet TAKE 2 TABLETS(80 MG) BY MOUTH DAILY 180 tablet 3  . furosemide (LASIX) 40 MG tablet TAKE 2 TABLETS(80 MG) BY MOUTH DAILY 180 tablet 0  . ipratropium (ATROVENT) 0.06 % nasal spray USE 2 SPRAYS IN EACH NOSTRIL THREE TIMES DAILY 15 mL 5  . loratadine (CLARITIN) 10 MG tablet Take 10 mg by mouth daily.    . Magnesium 250 MG TABS Take 1 tablet by mouth daily.    [name] mexiletine (MEXITIL) 150 MG capsule Take 2 capsules (  300 mg total) by mouth 2 (two) times daily. 360 capsule 1  . Multiple Vitamin (MULTIVITAMIN WITH MINERALS) TABS Take 1 tablet by mouth daily.    . nitroGLYCERIN (NITROSTAT) 0.4 MG SL tablet DISSOLVE 1 TABLET UNDER THE TONGUE EVERY 5 MINUTES AS NEEDED FOR CHEST PAIN 90 tablet 1  . omeprazole (PRILOSEC) 20 MG capsule Take 20 mg by mouth daily.    . polyethylene glycol (MIRALAX / GLYCOLAX) packet Take 17 g by mouth daily.    . polyvinyl alcohol-povidone (REFRESH) 1.4-0.6 % ophthalmic solution Place 1-2 drops into both eyes as needed (dry eyes).     . potassium chloride SA (K-DUR,KLOR-CON) 20 MEQ tablet Take 1 tablet (20 mEq total) by mouth daily. 90 tablet 3  . Probiotic Product (PROBIOTIC DAILY PO) Take 1 tablet by mouth daily.    . rivaroxaban (XARELTO) 20 MG TABS tablet TAKE 1 TABLET(20 MG) BY MOUTH DAILY WITH DINNER 90 tablet 3  . sacubitril-valsartan (ENTRESTO) 24-26 MG Take 1 tablet by mouth 2 (two) times daily. 180 tablet 3   No current facility-administered medications for this visit.     Allergies  Allergen Reactions  . Sulfa Antibiotics Rash  . Crestor [Rosuvastatin]     Review of Systems negative except from HPI and PMH  Physical Exam BP 108/80   Pulse 60   Ht _0  (1.778 m)   Wt 216 lb 6.4 oz (98.2 kg)   SpO2 96%   BMI 31.05 kg/m  Well  developed and nourished in no acute distress HENT normal Neck supple with JVP-flat Carotids brisk and full without bruits Clear Regular rate and rhythm, no murmurs or gallops Abd-soft with active BS without hepatomegaly No Clubbing cyanosis edema Skin-warm and dry A & Oriented  Grossly normal sensory and motor function   ECG demonstrates AV   biventricular pacing- @ 60 Lead 1 QS and V1 rR  Assessment and  Plan   Ventricular tachycardia  Ischemic and nonischemic cardiomyopathy  Congestive heart failure-chronic-systolic  CRT-D-[employer]'s  COPD pulm fibrosis  Atrial fibrillation  Post nasal drip  Overall feels great  Without symptoms of ischemia   No interval AFib or VT  No bleeding on Rivaroxaban   Tolerating amio and mexi  The question of amio lung toxicity remains, As above   His productive cough is not typical for amio lung injury  Given overall stability would await objective evidence of worsening pulm function prior to making adjustment.  In this regard CT and PFTs are scheduled for 3/19  Encouraged the use of nasal saline   More than 50% of 40 min was spent in counseling related to the above

## 2017-01-11 LAB — CUP PACEART REMOTE DEVICE CHECK
Battery Remaining Percentage: 60 %
Date Time Interrogation Session: 20181219081400
HighPow Impedance: 52 Ohm
Implantable Lead Implant Date: 20020527
Implantable Lead Location: 753859
Implantable Lead Model: 148
Implantable Lead Model: 4194
Implantable Lead Serial Number: 120244
Lead Channel Impedance Value: 657 Ohm
Lead Channel Impedance Value: 898 Ohm
Lead Channel Pacing Threshold Pulse Width: 0.3 ms
Lead Channel Pacing Threshold Pulse Width: 0.4 ms
Lead Channel Pacing Threshold Pulse Width: 1 ms
Lead Channel Setting Pacing Amplitude: 2.5 V
Lead Channel Setting Pacing Pulse Width: 1 ms
MDC IDC LEAD IMPLANT DT: 20020527
MDC IDC LEAD IMPLANT DT: 20131212
MDC IDC LEAD LOCATION: 753858
MDC IDC LEAD LOCATION: 753860
MDC IDC MSMT BATTERY REMAINING LONGEVITY: 36 mo
MDC IDC MSMT LEADCHNL LV PACING THRESHOLD AMPLITUDE: 1.9 V
MDC IDC MSMT LEADCHNL RA IMPEDANCE VALUE: 567 Ohm
MDC IDC MSMT LEADCHNL RA PACING THRESHOLD AMPLITUDE: 0.7 V
MDC IDC MSMT LEADCHNL RV PACING THRESHOLD AMPLITUDE: 1.2 V
MDC IDC PG IMPLANT DT: 20111207
MDC IDC SET LEADCHNL LV SENSING SENSITIVITY: 1 mV
MDC IDC SET LEADCHNL RA PACING AMPLITUDE: 2 V
MDC IDC SET LEADCHNL RV PACING AMPLITUDE: 2.5 V
MDC IDC SET LEADCHNL RV PACING PULSEWIDTH: 0.4 ms
MDC IDC SET LEADCHNL RV SENSING SENSITIVITY: 0.5 mV
MDC IDC STAT BRADY RA PERCENT PACED: 98 %
MDC IDC STAT BRADY RV PERCENT PACED: 100 %
Pulse Gen Serial Number: 492371

## 2017-01-20 ENCOUNTER — Other Ambulatory Visit: Payer: Self-pay | Admitting: Cardiology

## 2017-01-20 DIAGNOSIS — Z95810 Presence of automatic (implantable) cardiac defibrillator: Secondary | ICD-10-CM

## 2017-01-20 DIAGNOSIS — I059 Rheumatic mitral valve disease, unspecified: Secondary | ICD-10-CM

## 2017-01-20 DIAGNOSIS — I4891 Unspecified atrial fibrillation: Secondary | ICD-10-CM

## 2017-01-20 DIAGNOSIS — I429 Cardiomyopathy, unspecified: Secondary | ICD-10-CM

## 2017-01-20 DIAGNOSIS — I480 Paroxysmal atrial fibrillation: Secondary | ICD-10-CM

## 2017-02-04 ENCOUNTER — Telehealth: Payer: Self-pay | Admitting: *Deleted

## 2017-02-04 ENCOUNTER — Encounter: Payer: Self-pay | Admitting: Gastroenterology

## 2017-02-04 ENCOUNTER — Ambulatory Visit (INDEPENDENT_AMBULATORY_CARE_PROVIDER_SITE_OTHER): Payer: Medicare Other | Admitting: Gastroenterology

## 2017-02-04 VITALS — BP 90/60 | HR 72 | Ht 70.0 in | Wt 215.2 lb

## 2017-02-04 DIAGNOSIS — K648 Other hemorrhoids: Secondary | ICD-10-CM | POA: Diagnosis not present

## 2017-02-04 DIAGNOSIS — K625 Hemorrhage of anus and rectum: Secondary | ICD-10-CM | POA: Diagnosis not present

## 2017-02-04 NOTE — Telephone Encounter (Signed)
  02/04/2017   RE: [name] DOB: 02-18-1940 MRN: [id number]   Dear  Dr Stanford Breed or Dr Jens Som   We have scheduled the above patient for an endoscopic procedure. Our records show that he is on anticoagulation therapy.   Please advise as to how long the patient may come off his therapy of Xarelto prior to the procedure, which is scheduled for 02/13/2017.  Please fax back/ or route the completed form to [facility] at [phone].   Sincerely,    Dr Harl Bowie

## 2017-02-04 NOTE — Telephone Encounter (Signed)
Clearance faxed via Epic.

## 2017-02-04 NOTE — Progress Notes (Signed)
[name]    [id number]    22-Apr-1940  Primary Care Physician:Tabori, [name], MD  Referring Physician: [name], MD [address] [place],  [address]  Chief complaint: Blood per rectum, hemorrhoids  HPI: 77 year old male with history of ischemic cardiomyopathy, congestive heart failure on chronic anticoagulation hemorrhoids with rectal bleeding status post band ligation of hemorrhoids x3 here with complaints of recurrent small volume blood per rectum.  He did not have any rectal bleeding for about a year after hemorrhoidal band ligation but started noticing a small amount of blood after a bowel movement when he wipes in the past few weeks.  He did admit to having episodes of constipation and straining excessively.  Denies any abdominal pain, nausea, vomiting, dysphagia or any weight change.  Colonoscopy August 2017 small tubular adenoma was removed and sigmoid diverticulosis along with internal hemorrhoids  Outpatient Encounter Medications as of 02/04/2017  Medication Sig  . albuterol (VENTOLIN HFA) 108 (90 Base) MCG/ACT inhaler Inhale 1-2 puffs into the lungs every 4 (four) hours as needed for wheezing or shortness of breath.  [name] amiodarone (PACERONE) 200 MG tablet TAKE 1 TABLET(200 MG) BY MOUTH DAILY  . antiseptic oral rinse (BIOTENE) LIQD 15 mLs by Mouth Rinse route as needed for dry mouth (TID).  [name] budesonide-formoterol (SYMBICORT) 160-4.5 MCG/ACT inhaler Inhale 2 puffs into the lungs 2 (two) times daily.  . carvedilol (COREG) 12.5 MG tablet TAKE 1 TABLET BY MOUTH TWICE DAILY WITH A MEAL  . cholecalciferol (VITAMIN D) 1000 units tablet Take 1,000 Units by mouth daily.  [name] eplerenone (INSPRA) 25 MG tablet TAKE 1 TABLET(25 MG) BY MOUTH DAILY  . ezetimibe (ZETIA) 10 MG tablet TAKE 1 TABLET(10 MG) BY MOUTH DAILY  . fluticasone (FLONASE) 50 MCG/ACT nasal spray SHAKE LIQUID AND USE 2 SPRAYS IN EACH NOSTRIL DAILY  . furosemide (LASIX) 40 MG tablet TAKE  2 TABLETS(80 MG) BY MOUTH DAILY  . furosemide (LASIX) 40 MG tablet TAKE 2 TABLETS(80 MG) BY MOUTH DAILY  . ipratropium (ATROVENT) 0.06 % nasal spray USE 2 SPRAYS IN EACH NOSTRIL THREE TIMES DAILY  . loratadine (CLARITIN) 10 MG tablet Take 10 mg by mouth daily.  . Magnesium 250 MG TABS Take 1 tablet by mouth daily.  [name] mexiletine (MEXITIL) 150 MG capsule Take 2 capsules (300 mg total) by mouth 2 (two) times daily.  . Multiple Vitamin (MULTIVITAMIN WITH MINERALS) TABS Take 1 tablet by mouth daily.  . nitroGLYCERIN (NITROSTAT) 0.4 MG SL tablet DISSOLVE 1 TABLET UNDER THE TONGUE EVERY 5 MINUTES AS NEEDED FOR CHEST PAIN  . omeprazole (PRILOSEC) 20 MG capsule Take 20 mg by mouth daily.  . polyethylene glycol (MIRALAX / GLYCOLAX) packet Take 17 g by mouth daily.  . polyvinyl alcohol-povidone (REFRESH) 1.4-0.6 % ophthalmic solution Place 1-2 drops into both eyes as needed (dry eyes).   . potassium chloride SA (K-DUR,KLOR-CON) 20 MEQ tablet Take 1 tablet (20 mEq total) by mouth daily.  . Probiotic Product (PROBIOTIC DAILY PO) Take 1 tablet by mouth daily.  . rivaroxaban (XARELTO) 20 MG TABS tablet TAKE 1 TABLET(20 MG) BY MOUTH DAILY WITH DINNER  . sacubitril-valsartan (ENTRESTO) 24-26 MG Take 1 tablet by mouth 2 (two) times daily.   No facility-administered encounter medications on file as of 02/04/2017.     Allergies as of 02/04/2017 - Review Complete 01/10/2017  Allergen Reaction Noted  . Sulfa antibiotics Rash 04/04/2010  . Crestor [rosuvastatin]  11/14/2015    Past Medical History:  Diagnosis Date  . Adenomatous colon polyp 2009; 06/2012   Colonoscopy 07/2007---Dr. Barron Schmid GI--Repeat 06/2012 showed one tubular adenoma w/out high grade dysplasia.  [name] AICD (automatic cardioverter/defibrillator) present    [employer]  . Atrial fibrillation ([place])   . Bilateral sensorineural hearing loss   . BPH (benign prostatic hypertrophy)    PSAs ok (followed by urologist): no LUTS  . CAD (coronary  artery disease)    PTCA RCA 1997 (Inf/post MI);  . CHF (congestive heart failure) ([place])   . Chronic constipation   . Chronic rhinitis    ?vasomotor (Dr. Erik Obey)  . COPD (chronic obstructive pulmonary disease) ([place])   . Diverticulosis of colon 2005   Noted on Colonoscopies in 2005 and 2009 and on CT 2010  . Gallstones 06/2014   No evidence of cholecystitis on abd u/s 06/2014  . GERD (gastroesophageal reflux disease)   . Hepatic steatosis 2010; 2016   Noted on CT abd 2010 and on u/s abd 04/2009 (transaminasemia) and u/s 06/2014.  [name] Hyperlipidemia   . Hypertension   . Ischemic cardiomyopathy   . Microscopic hematuria    Cysto neg 02/2009 except for BPH-likely has microhem from his renal stone dz  . Nephrolithiasis    No distal stones on CT 2010; stable 8 mm RLP stone, 10m LMP stone 10/2013 plain film.  Abd u/s 06/2014: 9 mm nonobstructing lower pole right renal stone.  03/2015 urol: stable nonobstructing renal calc by CT 12/2014 and KUB 03/2015.  .Marland KitchenObstructive sleep apnea    no cpap uses since weight loss of 50 pounds  . Presence of permanent cardiac pacemaker    [employer] . Squamous cell carcinoma of back   . Ventricular tachycardia ([facility]     Past Surgical History:  Procedure Laterality Date  . ATRIAL ABLATION SURGERY     flutter ablation 2002  . BREAST SURGERY Left 1990   "Tissue removed from breast"  . CARDIAC CATHETERIZATION    . carotid doppler  09/25/12   No signif stenosis  . COLONOSCOPY N/A 07/13/2012   Diverticula ascending colon.  Polyp x 1-recall 5 yrs (Dr. MWatt Climes Procedure: COLONOSCOPY;  Surgeon: MJeryl Columbia MD;  Location: [facility];  Service: Endoscopy;  Laterality: N/A; polypectomy x 1   . COLONOSCOPY WITH PROPOFOL N/A 08/18/2015   Procedure: COLONOSCOPY WITH PROPOFOL;  Surgeon: KMauri Pole MD;  Location: [facility];  Service: Endoscopy;  Laterality: N/A;  . ESOPHAGOGASTRODUODENOSCOPY N/A 07/13/2012   Procedure: ESOPHAGOGASTRODUODENOSCOPY (EGD);   Surgeon: MJeryl Columbia MD;  [facility];  Service: Endoscopy;  Laterality: N/A;  NORMAL  . EXTRACORPOREAL SHOCK WAVE LITHOTRIPSY    . ICD     a) Guidant Contak H170- SVirl Axe MD 07/25/06 (second device) b) Medtronic CRT-D  . INGUINAL HERNIA REPAIR Bilateral 1975  . LEAD REVISION Bilateral 12/26/2011   Procedure: LEAD REVISION;  Surgeon: GEvans Lance MD;  [facility];  Service: Cardiovascular;  Laterality: Bilateral;  . PFTs  02/2015   Mod restriction (interstitial), mod diffusion defect (Dr. YAnnamaria Boots  . precancer area keratsis removed  05/2015    leftf [place] and left arm and right arm and back    Family History  Problem Relation Age of Onset  . Heart disease Mother   . Other Mother        colon surgery for fistula  . Gallstones Mother   . Breast cancer Maternal Grandmother   .  Coronary artery disease Father        family hx of  . Heart attack Father        4 stents/ pacemaker  . Nephrolithiasis Father   . Hypertension Sister   . Goiter Paternal Grandmother   . Nephrolithiasis Son   . Colon cancer Neg Hx     Social History   Socioeconomic History  . Marital status: Married    Spouse name: Not on file  . Number of children: 3  . Years of education: Not on file  . Highest education level: Not on file  Social Needs  . Financial resource strain: Not on file  . Food insecurity - worry: Not on file  . Food insecurity - inability: Not on file  . Transportation needs - medical: Not on file  . Transportation needs - non-medical: Not on file  Occupational History  . Occupation: Retired    Fish farm manager: RETIRED  Tobacco Use  . Smoking status: Former Smoker    Packs/day: 2.00    Years: 15.00    Pack years: 30.00    Types: Cigarettes    Last attempt to quit: 01/15/1980    Years since quitting: 37.0  . Smokeless tobacco: Never Used  Substance and Sexual Activity  . Alcohol use: No    Alcohol/week: 0.0 oz  . Drug use: No  . Sexual activity: Not on  file  Other Topics Concern  . Not on file  Social History Narrative   Married.   Hx of cigarettes: quit in the 1980s.  No alc/drugs.      Review of systems: Review of Systems  Constitutional: Negative for fever and chills. Positive for lack of energy HENT: Negative.   Eyes: Negative for blurred vision.  Respiratory: Negative for cough, shortness of breath and wheezing.   Cardiovascular: Negative for chest pain and palpitations.  Gastrointestinal: as per HPI Genitourinary: Negative for dysuria, urgency, frequency and hematuria.  Musculoskeletal: Negative for myalgias, back pain and joint pain.  Skin: Negative for itching and rash.  Neurological: Negative for dizziness, tremors, focal weakness, seizures and loss of consciousness.  Endo/Heme/Allergies: Positive for seasonal allergies.  Psychiatric/Behavioral: Negative for depression, suicidal ideas and hallucinations.  All other systems reviewed and are negative.   Physical Exam: Vitals:   02/04/17 1416  BP: 90/60  Pulse: 72   Body mass index is 30.89 kg/m. Gen:      No acute distress HEENT:  EOMI, sclera anicteric Neck:     No masses; no thyromegaly Lungs:    Clear to auscultation bilaterally; normal respiratory effort CV:         Regular rate and rhythm; no murmurs Abd:      + bowel sounds; soft, non-tender; no palpable masses, no distension Ext:    No edema; adequate peripheral perfusion Skin:      Warm and dry; no rash Neuro: alert and oriented x 3 Psych: normal mood and affect Rectal exam: Normal anal sphincter tone, no anal fissure or external hemorrhoids Anoscopy: Small internal hemorrhoids grade 2 in left lateral position, no active bleeding, normal dentate line, no visible nodules Data Reviewed:  Reviewed labs, radiology imaging, old records and pertinent past GI work up   Assessment and Plan/Recommendations: 77 year old male with history of ischemic cardiomyopathy, CHF on chronic anticoagulation here with  complaints of small volume rectal bleeding and recurrent symptomatic hemorrhoids Patient is requesting hemorrhoidal band ligation Will discuss with cardiology regarding holding anticoagulation Xarelto 1 day prior to the procedure and  following the procedure for 1 day Benefiber 1 tablespoon 3 times daily with meals Increase fluid intake to 10-12 cups daily Avoid excessive straining with bowel movements He will return next week for hemorrhoidal band ligation  25 minutes was spent face-to-face with the patient. Greater than 50% of the time used for counseling as well as treatment plan and follow-up. She had multiple questions which were answered to her satisfaction  K. Denzil Magnuson , MD [phone] Mon-Fri 8a-5p [phone] after 5p, weekends, holidays  CC: [name], MD

## 2017-02-04 NOTE — Patient Instructions (Signed)
We have scheduled you for a hemorrhoidal banding on 02/13/2017 at 11am  We will contact you about holding your blood thinner  Use Benefiber 1 tablespoon three times a day with meals  Use OTC Preparation H suppositories at bedtine for 7 days   Hemorrhoids Hemorrhoids are swollen veins in and around the rectum or anus. Hemorrhoids can cause pain, itching, or bleeding. Most of the time, they do not cause serious problems. They usually get better with diet changes, lifestyle changes, and other home treatments. Follow these instructions at home: Eating and drinking  Eat foods that have fiber, such as whole grains, beans, nuts, fruits, and vegetables. Ask your doctor about taking products that have added fiber (fibersupplements).  Drink enough fluid to keep your pee (urine) clear or pale yellow. For Pain and Swelling  Take a warm-water bath (sitz bath) for 20 minutes to ease pain. Do this 3-4 times a day.  If directed, put ice on the painful area. It may be helpful to use ice between your warm baths. ? Put ice in a plastic bag. ? Place a towel between your skin and the bag. ? Leave the ice on for 20 minutes, 2-3 times a day. General instructions  Take over-the-counter and prescription medicines only as told by your doctor. ? Medicated creams and medicines that are inserted into the anus (suppositories) may be used or applied as told.  Exercise often.  Go to the bathroom when you have the urge to poop (to have a bowel movement). Do not wait.  Avoid pushing too hard (straining) when you poop.  Keep the butt area dry and clean. Use wet toilet paper or moist paper towels.  Do not sit on the toilet for a long time. Contact a doctor if:  You have any of these: ? Pain and swelling that do not get better with treatment or medicine. ? Bleeding that will not stop. ? Trouble pooping or you cannot poop. ? Pain or swelling outside the area of the hemorrhoids. This information is not  intended to replace advice given to you by your health care provider. Make sure you discuss any questions you have with your health care provider. Document Released: 10/10/2007 Document Revised: 06/08/2015 Document Reviewed: 09/14/2014 Elsevier Interactive Patient Education  [employer].

## 2017-02-04 NOTE — Telephone Encounter (Addendum)
Patient with diagnosis of Afib with history of TIA on Xarelto for anticoagulation.    Procedure: endoscopy Date of procedure: 02/13/17  CHADS2-VASc score of  7 (CHF, HTN, AGE, DM2, stroke/tia x 2, CAD, AGE, male)  CrCl 61m/min  Per office protocol, patient can hold Xarelto for 24 hours prior to procedure and resume ASAP post procedure with history of TIA.

## 2017-02-04 NOTE — Telephone Encounter (Signed)
    Routing the note to pharmacy for them to address.  Rosaria Ferries, PA-C 02/04/2017 3:13 PM Beeper [phone]

## 2017-02-07 NOTE — Telephone Encounter (Signed)
Patient aware ok to hold Xarelto 24 hours before banding will be advised when he is in the office when to start back

## 2017-02-13 ENCOUNTER — Encounter: Payer: Self-pay | Admitting: Gastroenterology

## 2017-02-13 ENCOUNTER — Ambulatory Visit: Payer: Medicare Other | Admitting: Gastroenterology

## 2017-02-13 ENCOUNTER — Encounter (INDEPENDENT_AMBULATORY_CARE_PROVIDER_SITE_OTHER): Payer: Self-pay

## 2017-02-13 VITALS — BP 98/58 | HR 70 | Ht 70.0 in | Wt 213.6 lb

## 2017-02-13 DIAGNOSIS — K625 Hemorrhage of anus and rectum: Secondary | ICD-10-CM

## 2017-02-13 DIAGNOSIS — K641 Second degree hemorrhoids: Secondary | ICD-10-CM

## 2017-02-13 NOTE — Progress Notes (Signed)
PROCEDURE NOTE: The patient presents with symptomatic grade II  hemorrhoids, requesting rubber band ligation of his/her hemorrhoidal disease.  All risks, benefits and alternative forms of therapy were described and informed consent was obtained.  In the Left Lateral Decubitus position anoscopic examination revealed grade II hemorrhoids in the Left lateral and Right posterior position(s).  The anorectum was pre-medicated with 0.125% Nitroglycerine and Recticare The decision was made to band the Left lateral internal hemorrhoid, and the [place] was used to perform band ligation without complication.  Digital anorectal examination was then performed to assure proper positioning of the band, and to adjust the banded tissue as required.  The patient was discharged home without pain or other issues.  Dietary and behavioral recommendations were given and along with follow-up instructions.     The following adjunctive treatments were recommended: Restart Xarelto in 1 day on Feb 2nd  The patient will return in 4-6 weeks for  follow-up and possible additional banding as required. No complications were encountered and the patient tolerated the procedure well.  Damaris Hippo , MD [phone] Mon-Fri 8a-5p [phone] after 5p, weekends, holidays

## 2017-02-13 NOTE — Patient Instructions (Addendum)
HEMORRHOID BANDING PROCEDURE    FOLLOW-UP CARE    Go back on your Xarelto on Feb 2nd  1. The procedure you have had should have been relatively painless since the banding of the area involved does not have nerve endings and there is no pain sensation.  The rubber band cuts off the blood supply to the hemorrhoid and the band may fall off as soon as 48 hours after the banding (the band may occasionally be seen in the toilet bowl following a bowel movement). You may notice a temporary feeling of fullness in the rectum which should respond adequately to plain Tylenol or Motrin.  2. Following the banding, avoid strenuous exercise that evening and resume full activity the next day.  A sitz bath (soaking in a warm tub) or bidet is soothing, and can be useful for cleansing the area after bowel movements.     3. To avoid constipation, take two tablespoons of natural wheat bran, natural oat bran, flax, Benefiber or any over the counter fiber supplement and increase your water intake to 7-8 glasses daily.    4. Unless you have been prescribed anorectal medication, do not put anything inside your rectum for two weeks: No suppositories, enemas, fingers, etc.  5. Occasionally, you may have more bleeding than usual after the banding procedure.  This is often from the untreated hemorrhoids rather than the treated one.  Don't be concerned if there is a tablespoon or so of blood.  If there is more blood than this, lie flat with your bottom higher than your head and apply an ice pack to the area. If the bleeding does not stop within a half an hour or if you feel faint, call our office at [phone] or go to the emergency room.  6. Problems are not common; however, if there is a substantial amount of bleeding, severe pain, chills, fever or difficulty passing urine (very rare) or other problems, you should call us at [phone] or report to the nearest emergency room.  7. Do not stay seated continuously  for more than 2-3 hours for a day or two after the procedure.  Tighten your buttock muscles 10-15 times every two hours and take 10-15 deep breaths every 1-2 hours.  Do not spend more than a few minutes on the toilet if you cannot empty your bowel; instead re-visit the toilet at a later time.

## 2017-02-17 ENCOUNTER — Telehealth: Payer: Self-pay | Admitting: Gastroenterology

## 2017-02-17 NOTE — Telephone Encounter (Signed)
Patient is very constipated. Hard dry stools. He strained for the first stool and developed pain radiating from the tip of the penis to the rectum. He started using Prep H Suppositories which seems to have helped. He is taking Benefiber tid as directed.  Last night he took 2 Ducolax pills. Using Prep-H cream around anus before he tries to have the bowel movement. Denies any blood with or after the bowel movement.

## 2017-02-18 ENCOUNTER — Encounter: Payer: Self-pay | Admitting: Gastroenterology

## 2017-02-18 ENCOUNTER — Ambulatory Visit (INDEPENDENT_AMBULATORY_CARE_PROVIDER_SITE_OTHER): Payer: Medicare Other | Admitting: Gastroenterology

## 2017-02-18 ENCOUNTER — Encounter (INDEPENDENT_AMBULATORY_CARE_PROVIDER_SITE_OTHER): Payer: Self-pay

## 2017-02-18 ENCOUNTER — Other Ambulatory Visit: Payer: Self-pay | Admitting: *Deleted

## 2017-02-18 VITALS — BP 108/70 | HR 74 | Ht 70.0 in | Wt 218.1 lb

## 2017-02-18 DIAGNOSIS — I059 Rheumatic mitral valve disease, unspecified: Secondary | ICD-10-CM

## 2017-02-18 DIAGNOSIS — I4891 Unspecified atrial fibrillation: Secondary | ICD-10-CM

## 2017-02-18 DIAGNOSIS — Z95810 Presence of automatic (implantable) cardiac defibrillator: Secondary | ICD-10-CM

## 2017-02-18 DIAGNOSIS — Z7901 Long term (current) use of anticoagulants: Secondary | ICD-10-CM

## 2017-02-18 DIAGNOSIS — I429 Cardiomyopathy, unspecified: Secondary | ICD-10-CM

## 2017-02-18 DIAGNOSIS — G8918 Other acute postprocedural pain: Secondary | ICD-10-CM | POA: Diagnosis not present

## 2017-02-18 DIAGNOSIS — K6289 Other specified diseases of anus and rectum: Secondary | ICD-10-CM | POA: Diagnosis not present

## 2017-02-18 DIAGNOSIS — I480 Paroxysmal atrial fibrillation: Secondary | ICD-10-CM

## 2017-02-18 MED ORDER — EPLERENONE 25 MG PO TABS: 25.0000 mg | ORAL_TABLET | Freq: Every day | ORAL | 11 refills | Status: DC

## 2017-02-18 MED ORDER — AMBULATORY NON FORMULARY MEDICATION: 0 refills | Status: DC

## 2017-02-18 NOTE — Patient Instructions (Addendum)
We have given you a printed prescription of Nitroglycerin ointment to take to [facility] 1 tablespoon three times a day   Take Miralax 1 tablespoon at bedtime   Follow up in 3 months

## 2017-02-18 NOTE — Telephone Encounter (Signed)
Called patient, he had a soft bowel movement and pain is relieved . No bleeding or fever. He is travelling to [place] this weekend. He would like to come to for evaluation before he travels.  Beth, can we please bring him in this afternoon for a quick visit to check the hemorrhoidal banding site?

## 2017-02-18 NOTE — Telephone Encounter (Signed)
Patient agrees to be here at 1:45 pm for a 2:00 pm appointment with Dr Silverio Decamp.

## 2017-02-20 NOTE — Progress Notes (Signed)
[name]    [id number]    01/26/1940  Primary Care Physician:Tabori, [name], MD  Referring Physician: [name], MD [address] [place], [place] [address]  Chief complaint: Rectal pain  HPI:  77 year old male with history of CAD, CHF on chronic anticoagulation, Xarelto here with complaints of rectal pain after internal hemorrhoid band ligation on February 13, 2017.  He was severely constipated for 3 days after the hemorrhoidal band ligation, was straining excessively.  Started having pain in the rectum shooting to his scrotal area intermittently since yesterday.  Denies any rectal bleeding.  No fever.  Passing flatus and had a bowel movement yesterday.   Outpatient Encounter Medications as of 02/18/2017  Medication Sig  . albuterol (VENTOLIN HFA) 108 (90 Base) MCG/ACT inhaler Inhale 1-2 puffs into the lungs every 4 (four) hours as needed for wheezing or shortness of breath.  [name] amiodarone (PACERONE) 200 MG tablet TAKE 1 TABLET(200 MG) BY MOUTH DAILY  . antiseptic oral rinse (BIOTENE) LIQD 15 mLs by Mouth Rinse route as needed for dry mouth (TID).  [name] budesonide-formoterol (SYMBICORT) 160-4.5 MCG/ACT inhaler Inhale 2 puffs into the lungs 2 (two) times daily.  . carvedilol (COREG) 12.5 MG tablet TAKE 1 TABLET BY MOUTH TWICE DAILY WITH A MEAL  . cholecalciferol (VITAMIN D) 1000 units tablet Take 1,000 Units by mouth daily.  [name] ezetimibe (ZETIA) 10 MG tablet TAKE 1 TABLET(10 MG) BY MOUTH DAILY  . fluticasone (FLONASE) 50 MCG/ACT nasal spray SHAKE LIQUID AND USE 2 SPRAYS IN EACH NOSTRIL DAILY  . furosemide (LASIX) 40 MG tablet TAKE 2 TABLETS(80 MG) BY MOUTH DAILY  . ipratropium (ATROVENT) 0.06 % nasal spray USE 2 SPRAYS IN EACH NOSTRIL THREE TIMES DAILY  . loratadine (CLARITIN) 10 MG tablet Take 10 mg by mouth daily.  . Magnesium 250 MG TABS Take 1 tablet by mouth daily.  [name] mexiletine (MEXITIL) 150 MG capsule Take 2 capsules (300 mg total) by mouth 2 (two)  times daily.  . Multiple Vitamin (MULTIVITAMIN WITH MINERALS) TABS Take 1 tablet by mouth daily.  . nitroGLYCERIN (NITROSTAT) 0.4 MG SL tablet DISSOLVE 1 TABLET UNDER THE TONGUE EVERY 5 MINUTES AS NEEDED FOR CHEST PAIN  . omeprazole (PRILOSEC) 20 MG capsule Take 20 mg by mouth daily.  . polyethylene glycol (MIRALAX / GLYCOLAX) packet Take 17 g by mouth daily.  . polyvinyl alcohol-povidone (REFRESH) 1.4-0.6 % ophthalmic solution Place 1-2 drops into both eyes as needed (dry eyes).   . potassium chloride SA (K-DUR,KLOR-CON) 20 MEQ tablet Take 1 tablet (20 mEq total) by mouth daily.  . Probiotic Product (PROBIOTIC DAILY PO) Take 1 tablet by mouth daily.  . rivaroxaban (XARELTO) 20 MG TABS tablet TAKE 1 TABLET(20 MG) BY MOUTH DAILY WITH DINNER  . sacubitril-valsartan (ENTRESTO) 24-26 MG Take 1 tablet by mouth 2 (two) times daily.  . [DISCONTINUED] eplerenone (INSPRA) 25 MG tablet TAKE 1 TABLET(25 MG) BY MOUTH DAILY  . AMBULATORY NON FORMULARY MEDICATION Medication Name: Nitroglycerin ointment 0.125% Use pea sized amount 3-4 times a day per rectum   No facility-administered encounter medications on file as of 02/18/2017.     Allergies as of 02/18/2017 - Review Complete 02/18/2017  Allergen Reaction Noted  . Sulfa antibiotics Rash 04/04/2010  . Crestor [rosuvastatin]  11/14/2015    Past Medical History:  Diagnosis Date  . Adenomatous colon polyp 2009; 06/2012   Colonoscopy 07/2007---Dr. Barron Schmid GI--Repeat 06/2012 showed one  tubular adenoma w/out high grade dysplasia.  [name] AICD (automatic cardioverter/defibrillator) present    [employer]  . Atrial fibrillation ([place])   . Bilateral sensorineural hearing loss   . BPH (benign prostatic hypertrophy)    PSAs ok (followed by urologist): no LUTS  . CAD (coronary artery disease)    PTCA RCA 1997 (Inf/post MI);  . CHF (congestive heart failure) ([place])   . Chronic constipation   . Chronic rhinitis    ?vasomotor (Dr. Erik Obey)  . COPD (chronic  obstructive pulmonary disease) ([place])   . Diverticulosis of colon 2005   Noted on Colonoscopies in 2005 and 2009 and on CT 2010  . Gallstones 06/2014   No evidence of cholecystitis on abd u/s 06/2014  . GERD (gastroesophageal reflux disease)   . Hepatic steatosis 2010; 2016   Noted on CT abd 2010 and on u/s abd 04/2009 (transaminasemia) and u/s 06/2014.  [name] Hyperlipidemia   . Hypertension   . Ischemic cardiomyopathy   . Microscopic hematuria    Cysto neg 02/2009 except for BPH-likely has microhem from his renal stone dz  . Nephrolithiasis    No distal stones on CT 2010; stable 8 mm RLP stone, 88m LMP stone 10/2013 plain film.  Abd u/s 06/2014: 9 mm nonobstructing lower pole right renal stone.  03/2015 urol: stable nonobstructing renal calc by CT 12/2014 and KUB 03/2015.  .Marland KitchenObstructive sleep apnea    no cpap uses since weight loss of 50 pounds  . Presence of permanent cardiac pacemaker    [employer] . Squamous cell carcinoma of back   . Ventricular tachycardia ([facility]     Past Surgical History:  Procedure Laterality Date  . ATRIAL ABLATION SURGERY     flutter ablation 2002  . BREAST SURGERY Left 1990   "Tissue removed from breast"  . CARDIAC CATHETERIZATION    . carotid doppler  09/25/12   No signif stenosis  . COLONOSCOPY N/A 07/13/2012   Diverticula ascending colon.  Polyp x 1-recall 5 yrs (Dr. MWatt Climes Procedure: COLONOSCOPY;  Surgeon: MJeryl Columbia MD;  Location: [facility];  Service: Endoscopy;  Laterality: N/A; polypectomy x 1   . COLONOSCOPY WITH PROPOFOL N/A 08/18/2015   Procedure: COLONOSCOPY WITH PROPOFOL;  Surgeon: KMauri Pole MD;  Location: [facility]OSCOPY;  Service: Endoscopy;  Laterality: N/A;  . ESOPHAGOGASTRODUODENOSCOPY N/A 07/13/2012   Procedure: ESOPHAGOGASTRODUODENOSCOPY (EGD);  Surgeon: MJeryl Columbia MD;  [facility];  Service: Endoscopy;  Laterality: N/A;  NORMAL  . EXTRACORPOREAL SHOCK WAVE LITHOTRIPSY    . ICD     a) Guidant Contak H170- SVirl Axe MD 07/25/06 (second device) b) Medtronic CRT-D  . INGUINAL HERNIA REPAIR Bilateral 1975  . LEAD REVISION Bilateral 12/26/2011   Procedure: LEAD REVISION;  Surgeon: GEvans Lance MD;  [facility];  Service: Cardiovascular;  Laterality: Bilateral;  . PFTs  02/2015   Mod restriction (interstitial), mod diffusion defect (Dr. YAnnamaria Boots  . precancer area keratsis removed  05/2015    leftf [place] and left arm and right arm and back    Family History  Problem Relation Age of Onset  . Heart disease Mother   . Other Mother        colon surgery for fistula  . Gallstones Mother   . Breast cancer Maternal Grandmother   . Coronary artery disease Father        family hx of  . Heart attack Father        4  stents/ pacemaker  . Nephrolithiasis Father   . Hypertension Sister   . Goiter Paternal Grandmother   . Nephrolithiasis Son   . Colon cancer Neg Hx     Social History   Socioeconomic History  . Marital status: Married    Spouse name: Not on file  . Number of children: 3  . Years of education: Not on file  . Highest education level: Not on file  Social Needs  . Financial resource strain: Not on file  . Food insecurity - worry: Not on file  . Food insecurity - inability: Not on file  . Transportation needs - medical: Not on file  . Transportation needs - non-medical: Not on file  Occupational History  . Occupation: Retired    Fish farm manager: RETIRED  Tobacco Use  . Smoking status: Former Smoker    Packs/day: 2.00    Years: 15.00    Pack years: 30.00    Types: Cigarettes    Last attempt to quit: 01/15/1980    Years since quitting: 37.1  . Smokeless tobacco: Never Used  Substance and Sexual Activity  . Alcohol use: No    Alcohol/week: 0.0 oz  . Drug use: No  . Sexual activity: Not on file  Other Topics Concern  . Not on file  Social History Narrative   Married.   Hx of cigarettes: quit in the 1980s.  No alc/drugs.      Review of systems: Review of Systems    Constitutional: Negative for fever and chills.  HENT: Negative.   Eyes: Negative for blurred vision.  Respiratory: Negative for cough, shortness of breath and wheezing.   Cardiovascular: Negative for chest pain and palpitations.  Gastrointestinal: as per HPI Genitourinary: Negative for dysuria, urgency, frequency and hematuria.  Musculoskeletal: Negative for myalgias, back pain and joint pain.  Skin: Negative for itching and rash.  Neurological: Negative for dizziness, tremors, focal weakness, seizures and loss of consciousness.  Endo/Heme/Allergies: Positive for seasonal allergies.  Psychiatric/Behavioral: Negative for depression, suicidal ideas and hallucinations.  All other systems reviewed and are negative.   Physical Exam: Vitals:   02/18/17 1404  BP: 108/70  Pulse: 74   Body mass index is 31.3 kg/m. Gen:      No acute distress HEENT:  EOMI, sclera anicteric Neck:     No masses; no thyromegaly Lungs:    Clear to auscultation bilaterally; normal respiratory effort CV:         Regular rate and rhythm; no murmurs Abd:      + bowel sounds; soft, non-tender; no palpable masses, no distension Ext:    No edema; adequate peripheral perfusion Skin:      Warm and dry; no rash Neuro: alert and oriented x 3 Psych: normal mood and affect Rectal exam: Normal anal sphincter tone, no anal fissure or external hemorrhoids Anoscopy: Clean-based ulcer at site of internal hemorrhoid band ligation with no visible vessel or bleeding. Data Reviewed:  Reviewed labs, radiology imaging, old records and pertinent past GI work up   Assessment and Plan/Recommendations:  77 year old male with history of CAD, CHF on chronic anticoagulation, symptomatic internal hemorrhoids status post hemorrhoidal band ligation with complaints of rectal pain No evidence of anal fissure Clean based ulcer at the site of band ligation Advised patient to avoid constipation and excessive straining MiraLAX 1 capful  daily at bedtime, Benefiber 1 tablespoon 3 times daily with meals 0.125% nitroglycerin, pea-sized amount per rectum up to 3 times daily as needed  Damaris Hippo , MD [phone] Mon-Fri 8a-5p [phone] after 5p, weekends, holidays  CC: [name], MD

## 2017-03-06 ENCOUNTER — Other Ambulatory Visit: Payer: Self-pay

## 2017-03-06 ENCOUNTER — Ambulatory Visit (INDEPENDENT_AMBULATORY_CARE_PROVIDER_SITE_OTHER): Payer: Medicare Other | Admitting: Family Medicine

## 2017-03-06 ENCOUNTER — Encounter: Payer: Self-pay | Admitting: Family Medicine

## 2017-03-06 VITALS — BP 118/81 | HR 80 | Temp 98.3°F | Resp 16 | Ht 70.0 in | Wt 218.0 lb

## 2017-03-06 DIAGNOSIS — J029 Acute pharyngitis, unspecified: Secondary | ICD-10-CM | POA: Diagnosis not present

## 2017-03-06 LAB — POCT RAPID STREP A (OFFICE): RAPID STREP A SCREEN: NEGATIVE

## 2017-03-06 NOTE — Patient Instructions (Addendum)
Schedule a cholesterol follow up in the next 2-4 weeks and your complete Wellness Visit with [name] Continue to use your nasal sprays and your inhalers Drink plenty of fluids Rest! Call with any questions or concerns Have a great weekend!

## 2017-03-06 NOTE — Progress Notes (Signed)
   Subjective:    Patient ID: [name], male    DOB: October 20, 1940, 77 y.o.   MRN: [id number]  HPI URI- 'extreme sinus drainage', sore throat, productive cough.  Sore throat improves after gargling.  Using ipratropium nasal spray w/ some improvement.  No fevers.  Using his Symbicort twice daily and his albuterol rarely.  'I feel fine today'.  Denies SOB.  Denies sinus pain/pressure.  No HA.   Review of Systems For ROS see HPI     Objective:   Physical Exam  Constitutional: He is oriented to person, place, and time. He appears well-developed and well-nourished. No distress.  HENT:  Head: Normocephalic and atraumatic.  No TTP over sinuses + turbinate edema + PND TMs normal bilaterally  Eyes: Conjunctivae and EOM are normal. Pupils are equal, round, and reactive to light.  Neck: Normal range of motion. Neck supple.  Cardiovascular: Normal rate, regular rhythm and normal heart sounds.  Pulmonary/Chest: Effort normal and breath sounds normal. No respiratory distress. He has no wheezes.  Lymphadenopathy:    He has no cervical adenopathy.  Neurological: He is alert and oriented to person, place, and time.  Skin: Skin is warm and dry.  Psychiatric: He has a normal mood and affect. His behavior is normal. Thought content normal.  Vitals reviewed.         Assessment & Plan:  Sore throat- new.  Pt's sxs and PE consistent w/ PND/allergic rhinitis.  Continue nasal sprays and allergy meds.  No evidence of strep.  Reviewed supportive care and red flags that should prompt return.  Pt expressed understanding and is in agreement w/ plan.

## 2017-03-11 ENCOUNTER — Ambulatory Visit (INDEPENDENT_AMBULATORY_CARE_PROVIDER_SITE_OTHER)
Admission: RE | Admit: 2017-03-11 | Discharge: 2017-03-11 | Disposition: A | Discharge status: Home / Self Care | Payer: Medicare Other | Source: Ambulatory Visit | Attending: Emergency Medicine | Admitting: Emergency Medicine

## 2017-03-11 DIAGNOSIS — J849 Interstitial pulmonary disease, unspecified: Secondary | ICD-10-CM

## 2017-03-11 DIAGNOSIS — I509 Heart failure, unspecified: Secondary | ICD-10-CM | POA: Diagnosis not present

## 2017-03-12 ENCOUNTER — Encounter: Payer: Self-pay | Admitting: Emergency Medicine

## 2017-03-12 ENCOUNTER — Ambulatory Visit (INDEPENDENT_AMBULATORY_CARE_PROVIDER_SITE_OTHER): Payer: Medicare Other | Admitting: Emergency Medicine

## 2017-03-12 ENCOUNTER — Ambulatory Visit: Payer: Medicare Other

## 2017-03-12 ENCOUNTER — Encounter: Payer: Medicare Other | Admitting: Family Medicine

## 2017-03-12 DIAGNOSIS — J449 Chronic obstructive pulmonary disease, unspecified: Secondary | ICD-10-CM | POA: Diagnosis not present

## 2017-03-12 DIAGNOSIS — J849 Interstitial pulmonary disease, unspecified: Secondary | ICD-10-CM | POA: Diagnosis not present

## 2017-03-12 DIAGNOSIS — J310 Chronic rhinitis: Secondary | ICD-10-CM | POA: Diagnosis not present

## 2017-03-12 LAB — PULMONARY FUNCTION TEST
DL/VA % pred: 95 %
DL/VA: 4.45 ml/min/mmHg/L
DLCO unc % pred: 54 %
DLCO unc: 18.46 ml/min/mmHg
FEF 25-75 Post: 2.5 L/sec
FEF 25-75 Pre: 2.49 L/sec
FEF2575-%Change-Post: 0 %
FEF2575-%Pred-Post: 108 %
FEF2575-%Pred-Pre: 108 %
FEV1-%Change-Post: 0 %
FEV1-%Pred-Post: 72 %
FEV1-%Pred-Pre: 72 %
FEV1-Post: 2.3 L
FEV1-Pre: 2.3 L
FEV1FVC-%Change-Post: 1 %
FEV1FVC-%Pred-Pre: 113 %
FEV6-%Change-Post: -1 %
FEV6-%Pred-Post: 67 %
FEV6-%Pred-Pre: 68 %
FEV6-Post: 2.77 L
FEV6-Pre: 2.8 L
FEV6FVC-%Pred-Post: 106 %
FEV6FVC-%Pred-Pre: 106 %
FVC-%Change-Post: -1 %
FVC-%Pred-Post: 63 %
FVC-%Pred-Pre: 64 %
FVC-Post: 2.78 L
FVC-Pre: 2.82 L
Post FEV1/FVC ratio: 83 %
Post FEV6/FVC ratio: 100 %
Pre FEV1/FVC ratio: 82 %
Pre FEV6/FVC Ratio: 100 %
RV % pred: 68 %
RV: 1.79 L
TLC % pred: 68 %
TLC: 4.94 L

## 2017-03-12 NOTE — Progress Notes (Signed)
A user error has taken place.

## 2017-03-12 NOTE — Progress Notes (Signed)
Subjective:    Patient ID: [name], male    DOB: [birth year], 77 y.o.   MRN: [id number]  HPI 77 year old former smoker with a history of hypertension, atrial fibrillation on amiodarone, coronary disease with ischemic cardiomyopathy with ICD/pacer, formerly followed by Dr Gwenette Greet for obstructive sleep apnea on CPAP 16 cm water. He stopped using CPAP about 2 years ago due to dryness. He has tried multiple masks and humidity, but has been unable to tolerate. He is fatigued, is up at night freq to urinate. He underwent PFT that I have reviewed, shows mild mixed disease, restrictive disease on volumes, decreased diffusion capacity that corects for Va.  He describes cough that is likely related to chronic rhintis, partially treated GERD. He recently changed from flonase to ipratropium which has helped. He still coughs up secretions in the am. He was just started on albuterol - seems to benefit from this.          ROV 05/18/15 -- follow-up visit for history of dyspnea in the setting of hypertension, atrial fibrillation on amiodarone, mixed disease on pulmonary function testing, untreated obstructive sleep apnea. He had not been treated with scheduled bronchodilators but had benefited from albuterol and so we started Symbicort which was then substituted with [employer] for his insurance formulary. He was also stared on omeprazole by Dr Erik Obey, changed fluticasone to ipratropium nasal spray > cough and mucous are better. He also underwent a CT scan of the chest on 04/04/15 that person reviewed. This showed evidence for several small pulmonary nodules with some patchy subpleural reticulation and traction bronchiectasis consistent with possible early UIP.  He starts that his cough and dyspnea are significantly improved on the new meds.   ROV 11/14/15 -- This is a follow-up visit for patient with a history of tobacco use and newly diagnosed COPD. He also has a history of obstructive sleep apnea that has not been treated  due to CPAP intolerance. He has chronic rhinitis and is on ipratropium nasal spray. Also treated with GERD for omeprazole. Both of these interventions have helped his chronic cough. He has some very mild interstitial changes with associated nodularity on CT scan of the chest, question related to amiodarone exposure. Repeat CT scan of his chest was done on 09/20/15 that I have personally reviewed. This shows stable right lower lobe and left lower lobe pulmonary nodules. No new nodules were seen. There was no change in his bilateral interstitial disease. No auto-immune eval to date.   ROV 03/26/16 -- this is a follow-up visit for COPD. Also w a hx of chronic rhinitis, GERD-related cough. Stable B lower lobe nodular ILD on CT scan, last on 03/19/16 which I have reviewed >> shows no LAD, stable 6x37m LLL nodule, chronic interstitial changes, no new findings. He has remained on amiodarone. He is on loratadine qd, omeprazole. Has atrovent NS to use prn. He recently stopped nasal washes. On symbicort - feels that it has helped his breathing. Denies fever, chills. Coughing up greenish sputum.  ROV 09/26/16 -- patient has a history of COPD, chronic rhinitis, GERD, cough. He also has stable bilateral lower lobe interstitial disease by CT scan of the chest, stable 6 x 8 mm left lower lobe nodule. He reports that he is doing fairly well. He has a lot of nasal gtt, he is on flonase, temporarily stopped ipratropium NS. He feels that lately his gtt is worse - wants to go back on the ipratropium. Remains on loratadine. Rare albuterol use.  Needs ventolin refill.   Remains on amiodarone.           ROV 03/12/17 --77 year old gentleman who follows up today for history of COPD and stable bilateral lower lobe  interstitial disease that we followed by CT scan of the chest.  We never were able to establish a relationship with amiodarone and he remains on the amiodarone now.  A CT scan of the chest was done on 2/26 which I have reviewed.  There has been minimal change and progression since 03/19/16, a more noticeable change compared back to 09/29/13.  He underwent pulmonary function testing today and I have reviewed.  This shows mixed restriction and obstruction on spirometry, no bronchodilator response.  His FEV1 is 2.30 (72% predicted).  This is down slightly from his previous PFTs 02/08/15.  His current lung volumes show restriction, diffusion capacity is decreased but corrects to the normal range when adjusted for his alveolar volume.  Both of these are stable compared with 2017.   He reports that he has had nasal gtt and cough for the last week. Some wheeze - has prompted him to use albuterol bid, which he rarely does. He is concerned that it may have impacted his PFTs                                                                                                                                                                                            Review of Systems As per HPI   Past Medical History:  Diagnosis Date  . Adenomatous colon polyp 2009; 06/2012   Colonoscopy 07/2007---Dr. Barron Schmid GI--Repeat 06/2012 showed one tubular adenoma w/out high grade dysplasia.  [name] AICD (automatic cardioverter/defibrillator) present    [employer]  . Atrial fibrillation ([place])   . Bilateral sensorineural hearing loss   . BPH (benign prostatic hypertrophy)    PSAs ok (followed by urologist): no LUTS  . CAD (coronary artery disease)    PTCA RCA 1997 (Inf/post MI);  . CHF (congestive heart failure) ([place])   . Chronic constipation   . Chronic rhinitis    ?vasomotor (Dr. Erik Obey)  . COPD (chronic obstructive pulmonary  disease) ([place])   . Diverticulosis of colon 2005   Noted on Colonoscopies in 2005 and 2009 and  on CT 2010  . Gallstones 06/2014   No evidence of cholecystitis on abd u/s 06/2014  . GERD (gastroesophageal reflux disease)   . Hepatic steatosis 2010; 2016   Noted on CT abd 2010 and on u/s abd 04/2009 (transaminasemia) and u/s 06/2014.  [name] Hyperlipidemia   . Hypertension   . Ischemic cardiomyopathy   . Microscopic hematuria    Cysto neg 02/2009 except for BPH-likely has microhem from his renal stone dz  . Nephrolithiasis    No distal stones on CT 2010; stable 8 mm RLP stone, 31m LMP stone 10/2013 plain film.  Abd u/s 06/2014: 9 mm nonobstructing lower pole right renal stone.  03/2015 urol: stable nonobstructing renal calc by CT 12/2014 and KUB 03/2015.  .Marland KitchenObstructive sleep apnea    no cpap uses since weight loss of 50 pounds  . Presence of permanent cardiac pacemaker    [employer] . Squamous cell carcinoma of back   . Ventricular tachycardia (HCC)      Family History  Problem Relation Age of Onset  . Heart disease Mother   . Other Mother        colon surgery for fistula  . Gallstones Mother   . Breast cancer Maternal Grandmother   . Coronary artery disease Father        family hx of  . Heart attack Father        4 stents/ pacemaker  . Nephrolithiasis Father   . Hypertension Sister   . Goiter Paternal Grandmother   . Nephrolithiasis Son   . Colon cancer Neg Hx      Social History   Socioeconomic History  . Marital status: Married    Spouse name: Not on file  . Number of children: 3  . Years of education: Not on file  . Highest education level: Not on file  Social Needs  . Financial resource strain: Not on file  . Food insecurity - worry: Not on file  . Food insecurity - inability: Not on file  . Transportation needs - medical: Not on file  . Transportation needs - non-medical: Not on file  Occupational History  . Occupation: Retired    [employer] farm manager RETIRED  Tobacco Use  . Smoking status: Former Smoker    Packs/day: 2.00    Years: 15.00    Pack years: 30.00     Types: Cigarettes    Last attempt to quit: 01/15/1980    Years since quitting: 37.1  . Smokeless tobacco: Never Used  Substance and Sexual Activity  . Alcohol use: No    Alcohol/week: 0.0 oz  . Drug use: No  . Sexual activity: Not on file  Other Topics Concern  . Not on file  Social History Narrative   Married.   Hx of cigarettes: quit in the 1980s.  No alc/drugs.     Allergies  Allergen Reactions  . Sulfa Antibiotics Rash  . Crestor [Rosuvastatin]      Outpatient Medications Prior to Visit  Medication Sig Dispense Refill  . albuterol (VENTOLIN HFA) 108 (90 Base) MCG/ACT inhaler Inhale 1-2 puffs into the lungs every 4 (four) hours as needed for wheezing or shortness of breath. 1 Inhaler 2  . AMBULATORY NON FORMULARY MEDICATION Medication Name: Nitroglycerin ointment 0.125% Use pea sized amount 3-4 times a day per rectum 30 g 0  . amiodarone (PACERONE) 200 MG tablet  TAKE 1 TABLET(200 MG) BY MOUTH DAILY 90 tablet 2  . antiseptic oral rinse (BIOTENE) LIQD 15 mLs by Mouth Rinse route as needed for dry mouth (TID).    [name] budesonide-formoterol (SYMBICORT) 160-4.5 MCG/ACT inhaler Inhale 2 puffs into the lungs 2 (two) times daily. 1 Inhaler 5  . carvedilol (COREG) 12.5 MG tablet TAKE 1 TABLET BY MOUTH TWICE DAILY WITH A MEAL 180 tablet 3  . cholecalciferol (VITAMIN D) 1000 units tablet Take 1,000 Units by mouth daily.    [name] eplerenone (INSPRA) 25 MG tablet Take 1 tablet (25 mg total) by mouth daily. 30 tablet 11  . ezetimibe (ZETIA) 10 MG tablet TAKE 1 TABLET(10 MG) BY MOUTH DAILY 30 tablet 6  . fluticasone (FLONASE) 50 MCG/ACT nasal spray SHAKE LIQUID AND USE 2 SPRAYS IN EACH NOSTRIL DAILY 16 g 5  . furosemide (LASIX) 40 MG tablet TAKE 2 TABLETS(80 MG) BY MOUTH DAILY 180 tablet 0  . ipratropium (ATROVENT) 0.06 % nasal spray USE 2 SPRAYS IN EACH NOSTRIL THREE TIMES DAILY 15 mL 5  . loratadine (CLARITIN) 10 MG tablet Take 10 mg by mouth daily.    . Magnesium 250 MG TABS Take 1 tablet by  mouth daily.    [name] mexiletine (MEXITIL) 150 MG capsule Take 2 capsules (300 mg total) by mouth 2 (two) times daily. 360 capsule 1  . Multiple Vitamin (MULTIVITAMIN WITH MINERALS) TABS Take 1 tablet by mouth daily.    . nitroGLYCERIN (NITROSTAT) 0.4 MG SL tablet DISSOLVE 1 TABLET UNDER THE TONGUE EVERY 5 MINUTES AS NEEDED FOR CHEST PAIN 90 tablet 1  . omeprazole (PRILOSEC) 20 MG capsule Take 20 mg by mouth daily.    . polyethylene glycol (MIRALAX / GLYCOLAX) packet Take 17 g by mouth daily.    . polyvinyl alcohol-povidone (REFRESH) 1.4-0.6 % ophthalmic solution Place 1-2 drops into both eyes as needed (dry eyes).     . potassium chloride SA (K-DUR,KLOR-CON) 20 MEQ tablet Take 1 tablet (20 mEq total) by mouth daily. 90 tablet 3  . Probiotic Product (PROBIOTIC DAILY PO) Take 1 tablet by mouth daily.    . rivaroxaban (XARELTO) 20 MG TABS tablet TAKE 1 TABLET(20 MG) BY MOUTH DAILY WITH DINNER 90 tablet 3  . sacubitril-valsartan (ENTRESTO) 24-26 MG Take 1 tablet by mouth 2 (two) times daily. 180 tablet 3   No facility-administered medications prior to visit.          Objective:   Physical Exam Vitals:   03/12/17 1500  BP: 102/72  Pulse: 76  SpO2: 98%  Weight: 217 lb (98.4 kg)  Height: _0  (1.803 m)   Gen: Pleasant, well-nourished, in no distress,  normal affect  ENT: No lesions,  mouth clear,  oropharynx clear, no postnasal drip  Neck: No JVD, no TMG, no carotid bruits  Lungs: No use of accessory muscles, clear without rales or rhonchi  Cardiovascular: RRR, heart sounds normal, no murmur or gallops, no peripheral edema  Musculoskeletal: No deformities, no cyanosis or clubbing  Neuro: alert, non focal  Skin: Warm, no lesions or rashes      Assessment & Plan:  Interstitial lung disease (HCC) Slight continued progression based on CT scan of the chest.  His TLC and DLCO are for the most part stable.  I ascribe his decreased FEV1 to some active COPD at this time.  I believe we  need to consider the pros and cons of continuing his amiodarone given the continued change.  I will discuss with Dr.  Caryl Comes and we will come up with a plan.  COPD (chronic obstructive pulmonary disease) ([place]) He has been more symptomatic over the last week in the setting of increased allergic rhinitis.  He does not have an acute exacerbation right now.  I will continue his stable medications  Chronic rhinitis Worse over the last week.  Suspect he is having flaring allergic symptoms.  He has been using ipratropium nasal spray but not as Flonase.  We will restart this now.  Also continue his nasal saline washes.  Baltazar Apo, MD, PhD 03/12/2017, 3:52 PM [facility] [phone] or if no answer [phone]

## 2017-03-12 NOTE — Patient Instructions (Addendum)
Your CT chest shows slight progression of your lung scarring compared with March 2018.  Your total lung capacity and diffusion capacity on your lung testing are for the most part stable compared with 2017.  There may be an argument to consider stopping amiodarone since your CT scan has changed. Dr Lamonte Sakai will discuss with Dr Caryl Comes.  Continue to use ipratropium nasal spray as needed for congestion and drainage.  Restart your flonase 2 sprays each side daily.  Continue your loratadine daily.  Continue symbicort twice a day. Remember to rinse and gargle after using.  Follow with Dr Lamonte Sakai in 3 months or sooner if you have any problems.

## 2017-03-12 NOTE — Progress Notes (Signed)
PFT completed today 03/12/17

## 2017-03-12 NOTE — Assessment & Plan Note (Signed)
Slight continued progression based on CT scan of the chest.  His TLC and DLCO are for the most part stable.  I ascribe his decreased FEV1 to some active COPD at this time.  I believe we need to consider the pros and cons of continuing his amiodarone given the continued change.  I will discuss with Dr. Caryl Comes and we will come up with a plan.

## 2017-03-12 NOTE — Assessment & Plan Note (Signed)
Worse over the last week.  Suspect he is having flaring allergic symptoms.  He has been using ipratropium nasal spray but not as Flonase.  We will restart this now.  Also continue his nasal saline washes.

## 2017-03-12 NOTE — Assessment & Plan Note (Signed)
He has been more symptomatic over the last week in the setting of increased allergic rhinitis.  He does not have an acute exacerbation right now.  I will continue his stable medications

## 2017-03-19 ENCOUNTER — Telehealth: Payer: Self-pay | Admitting: Cardiology

## 2017-03-19 NOTE — Telephone Encounter (Signed)
LEFT MESSAGE - DEFERRED TO DR Stanford Breed

## 2017-03-19 NOTE — Telephone Encounter (Signed)
If Dr Lamonte Sakai thinks his lung disease is worsening due to amiodarone, would DC. Kirk Ruths

## 2017-03-19 NOTE — Telephone Encounter (Signed)
Mr. Musial is calling because he is wanting Dr. Stanford Breed input about the Amiodarone that Dr. Lamonte Sakai talked about to Dr. Caryl Comes and is wanting to know Dr. Stanford Breed thoughts on it . Please call

## 2017-03-20 ENCOUNTER — Other Ambulatory Visit: Payer: Self-pay | Admitting: *Deleted

## 2017-03-20 NOTE — Telephone Encounter (Signed)
Spoke with pt, aware to stop amiodarone.

## 2017-03-24 ENCOUNTER — Telehealth: Payer: Self-pay | Admitting: Cardiology

## 2017-03-24 NOTE — Telephone Encounter (Signed)
Patient wants to know if MD has looked at notes from Dr. Lamonte Sakai about recommendations on stopping amiodarone due to lung problems? Pt has stopped taking by amiodarone per Dr. Stanford Breed recommendations. Is there any other medicines that the patient can take?  Informed pt that I would send note to MD and his nurse and have someone will call him back. Pt verbalized understanding.

## 2017-03-26 NOTE — Telephone Encounter (Signed)
Spoke with pt and he would like to personally speak with Dr Caryl Comes regarding re-starting Amiodarone. Dr Stanford Breed has advised he stop taking it. He would like to discuss the risk/benefits of beginning this again.

## 2017-03-31 ENCOUNTER — Other Ambulatory Visit: Payer: Self-pay | Admitting: Cardiology

## 2017-03-31 DIAGNOSIS — I059 Rheumatic mitral valve disease, unspecified: Secondary | ICD-10-CM

## 2017-03-31 DIAGNOSIS — I429 Cardiomyopathy, unspecified: Secondary | ICD-10-CM

## 2017-03-31 DIAGNOSIS — I480 Paroxysmal atrial fibrillation: Secondary | ICD-10-CM

## 2017-03-31 DIAGNOSIS — Z95810 Presence of automatic (implantable) cardiac defibrillator: Secondary | ICD-10-CM

## 2017-04-01 DIAGNOSIS — L820 Inflamed seborrheic keratosis: Secondary | ICD-10-CM | POA: Diagnosis not present

## 2017-04-01 DIAGNOSIS — D485 Neoplasm of uncertain behavior of skin: Secondary | ICD-10-CM | POA: Diagnosis not present

## 2017-04-01 DIAGNOSIS — L853 Xerosis cutis: Secondary | ICD-10-CM | POA: Diagnosis not present

## 2017-04-01 DIAGNOSIS — D1801 Hemangioma of skin and subcutaneous tissue: Secondary | ICD-10-CM | POA: Diagnosis not present

## 2017-04-01 DIAGNOSIS — Z85828 Personal history of other malignant neoplasm of skin: Secondary | ICD-10-CM | POA: Diagnosis not present

## 2017-04-01 DIAGNOSIS — L821 Other seborrheic keratosis: Secondary | ICD-10-CM | POA: Diagnosis not present

## 2017-04-01 DIAGNOSIS — D692 Other nonthrombocytopenic purpura: Secondary | ICD-10-CM | POA: Diagnosis not present

## 2017-04-01 DIAGNOSIS — L570 Actinic keratosis: Secondary | ICD-10-CM | POA: Diagnosis not present

## 2017-04-02 ENCOUNTER — Ambulatory Visit (INDEPENDENT_AMBULATORY_CARE_PROVIDER_SITE_OTHER): Payer: Medicare Other | Admitting: *Deleted

## 2017-04-02 DIAGNOSIS — I472 Ventricular tachycardia: Secondary | ICD-10-CM

## 2017-04-02 DIAGNOSIS — I4729 Other ventricular tachycardia: Secondary | ICD-10-CM

## 2017-04-02 NOTE — Progress Notes (Signed)
Remote ICD transmission.   

## 2017-04-03 ENCOUNTER — Encounter: Payer: Self-pay | Admitting: Cardiology

## 2017-04-04 ENCOUNTER — Telehealth: Payer: Self-pay | Admitting: Emergency Medicine

## 2017-04-04 LAB — CUP PACEART REMOTE DEVICE CHECK
Battery Remaining Longevity: 36 mo
Battery Remaining Percentage: 54 %
Brady Statistic RV Percent Paced: 100 %
Date Time Interrogation Session: 20190320070100
HighPow Impedance: 52 Ohm
Implantable Lead Implant Date: 20020527
Implantable Lead Implant Date: 20020527
Implantable Lead Implant Date: 20131212
Implantable Lead Location: 753859
Implantable Lead Model: 148
Implantable Lead Model: 4194
Implantable Lead Serial Number: 120244
Implantable Pulse Generator Implant Date: 20111207
Lead Channel Pacing Threshold Amplitude: 0.7 V
Lead Channel Pacing Threshold Amplitude: 1 V
Lead Channel Pacing Threshold Amplitude: 1.8 V
Lead Channel Pacing Threshold Pulse Width: 0.4 ms
Lead Channel Pacing Threshold Pulse Width: 1 ms
Lead Channel Setting Pacing Amplitude: 2.5 V
Lead Channel Setting Pacing Amplitude: 2.5 V
Lead Channel Setting Pacing Pulse Width: 1 ms
Lead Channel Setting Sensing Sensitivity: 0.5 mV
MDC IDC LEAD LOCATION: 753858
MDC IDC LEAD LOCATION: 753860
MDC IDC MSMT LEADCHNL LV IMPEDANCE VALUE: 787 Ohm
MDC IDC MSMT LEADCHNL RA IMPEDANCE VALUE: 575 Ohm
MDC IDC MSMT LEADCHNL RA PACING THRESHOLD PULSEWIDTH: 0.3 ms
MDC IDC MSMT LEADCHNL RV IMPEDANCE VALUE: 658 Ohm
MDC IDC PG SERIAL: 492371
MDC IDC SET LEADCHNL LV SENSING SENSITIVITY: 1 mV
MDC IDC SET LEADCHNL RA PACING AMPLITUDE: 2 V
MDC IDC SET LEADCHNL RV PACING PULSEWIDTH: 0.4 ms
MDC IDC STAT BRADY RA PERCENT PACED: 98 %

## 2017-04-04 NOTE — Telephone Encounter (Signed)
Spoke with pt  He is asking about whether or not RB has spoken with Dr. Caryl Comes about d/c'ing the amiodarone  The last AVS mentioned that this would be done Please advise thanks!

## 2017-04-07 NOTE — Telephone Encounter (Signed)
Let him Know I'm sorry for not getting back to him sooner. I did contact with DR Caryl Comes, who would like for Leeman to stay on amiodarone if at all possible. I am OK with this plan. We will need to continue to follow his lung status closely on the medication.

## 2017-04-07 NOTE — Telephone Encounter (Signed)
RB, please advise. Thanks!

## 2017-04-07 NOTE — Telephone Encounter (Signed)
Spoke with pt. He is aware of RB's response. Nothing further was needed.

## 2017-04-09 ENCOUNTER — Ambulatory Visit (INDEPENDENT_AMBULATORY_CARE_PROVIDER_SITE_OTHER): Payer: Medicare Other | Admitting: Gastroenterology

## 2017-04-09 ENCOUNTER — Encounter: Payer: Self-pay | Admitting: Gastroenterology

## 2017-04-09 VITALS — BP 90/60 | HR 64 | Ht 70.0 in | Wt 215.0 lb

## 2017-04-09 DIAGNOSIS — K641 Second degree hemorrhoids: Secondary | ICD-10-CM

## 2017-04-09 NOTE — Patient Instructions (Addendum)
Restart Xarelto tomorrow on 3/28  Follow up as needed  [place]   1. The procedure you have had should have been relatively painless since the banding of the area involved does not have nerve endings and there is no pain sensation.  The rubber band cuts off the blood supply to the hemorrhoid and the band may fall off as soon as 48 hours after the banding (the band may occasionally be seen in the toilet bowl following a bowel movement). You may notice a temporary feeling of fullness in the rectum which should respond adequately to plain Tylenol or Motrin.  2. Following the banding, avoid strenuous exercise that evening and resume full activity the next day.  A sitz bath (soaking in a warm tub) or bidet is soothing, and can be useful for cleansing the area after bowel movements.     3. To avoid constipation, take two tablespoons of natural wheat bran, natural oat bran, flax, Benefiber or any over the counter fiber supplement and increase your water intake to 7-8 glasses daily.    4. Unless you have been prescribed anorectal medication, do not put anything inside your rectum for two weeks: No suppositories, enemas, fingers, etc.  5. Occasionally, you may have more bleeding than usual after the banding procedure.  This is often from the untreated hemorrhoids rather than the treated one.  Don't be concerned if there is a tablespoon or so of blood.  If there is more blood than this, lie flat with your bottom higher than your head and apply an ice pack to the area. If the bleeding does not stop within a half an hour or if you feel faint, call our office at [phone] or go to the emergency room.  6. Problems are not common; however, if there is a substantial amount of bleeding, severe pain, chills, fever or difficulty passing urine (very rare) or other problems, you should call us at [phone] or report to the nearest emergency room.  7. Do not stay  seated continuously for more than 2-3 hours for a day or two after the procedure.  Tighten your buttock muscles 10-15 times every two hours and take 10-15 deep breaths every 1-2 hours.  Do not spend more than a few minutes on the toilet if you cannot empty your bowel; instead re-visit the toilet at a later time.

## 2017-04-09 NOTE — Progress Notes (Signed)
PROCEDURE NOTE: The patient presents with symptomatic grade II hemorrhoids, requesting rubber band ligation of his/her hemorrhoidal disease.  All risks, benefits and alternative forms of therapy were described and informed consent was obtained.   The anorectum was pre-medicated with 0.125% Nitroglycerine and Recticare The decision was made to band the Right posterior internal hemorrhoid, and the Bude was used to perform band ligation without complication.  Digital anorectal examination was then performed to assure proper positioning of the band, and to adjust the banded tissue as required.  The patient was discharged home without pain or other issues.  Dietary and behavioral recommendations were given and along with follow-up instructions.     The following adjunctive treatments were recommended: Restart Xarelto tomorrow Continue current bowel regimen  The patient will return for  follow-up and possible additional banding as required. No complications were encountered and the patient tolerated the procedure well.  Damaris Hippo , MD [phone] Mon-Fri 8a-5p [phone] after 5p, weekends, holidays

## 2017-04-15 ENCOUNTER — Other Ambulatory Visit: Payer: Self-pay | Admitting: Emergency Medicine

## 2017-04-24 ENCOUNTER — Other Ambulatory Visit: Payer: Self-pay | Admitting: Emergency Medicine

## 2017-04-24 NOTE — Telephone Encounter (Signed)
[place] about amiodarone and its use for [place] storm.   There is no easy answer but for now it is on hold with plans I think to recheck DLCO/CT

## 2017-05-21 ENCOUNTER — Other Ambulatory Visit: Payer: Self-pay | Admitting: Cardiology

## 2017-05-21 NOTE — Telephone Encounter (Signed)
Rx(s) sent to pharmacy electronically.  

## 2017-05-27 ENCOUNTER — Ambulatory Visit (INDEPENDENT_AMBULATORY_CARE_PROVIDER_SITE_OTHER): Payer: Medicare Other | Admitting: Emergency Medicine

## 2017-05-27 ENCOUNTER — Encounter: Payer: Self-pay | Admitting: Emergency Medicine

## 2017-05-27 DIAGNOSIS — G4733 Obstructive sleep apnea (adult) (pediatric): Secondary | ICD-10-CM | POA: Diagnosis not present

## 2017-05-27 DIAGNOSIS — K219 Gastro-esophageal reflux disease without esophagitis: Secondary | ICD-10-CM | POA: Diagnosis not present

## 2017-05-27 DIAGNOSIS — J849 Interstitial pulmonary disease, unspecified: Secondary | ICD-10-CM

## 2017-05-27 NOTE — Assessment & Plan Note (Signed)
Continue CPAP qhs

## 2017-05-27 NOTE — Assessment & Plan Note (Signed)
Continue omeprazole as ordered

## 2017-05-27 NOTE — Progress Notes (Signed)
Subjective:    Patient ID: [name], male    DOB: [birth year], 77 y.o.   MRN: [id number]  HPI 77 year old former smoker with a history of hypertension, atrial fibrillation on amiodarone, coronary disease with ischemic cardiomyopathy with ICD/pacer, formerly followed by Dr Gwenette Greet for obstructive sleep apnea on CPAP 16 cm water. He stopped using CPAP about 2 years ago due to dryness. He has tried multiple masks and humidity, but has been unable to tolerate. He is fatigued, is up at night freq to urinate. He underwent PFT that I have reviewed, shows mild mixed disease, restrictive disease on volumes, decreased diffusion capacity that corects for Va.  He describes cough that is likely related to chronic rhintis, partially treated GERD. He recently changed from flonase to ipratropium which has helped. He still coughs up secretions in the am. He was just started on albuterol - seems to benefit from this.          ROV 05/18/15 -- follow-up visit for history of dyspnea in the setting of hypertension, atrial fibrillation on amiodarone, mixed disease on pulmonary function testing, untreated obstructive sleep apnea. He had not been treated with scheduled bronchodilators but had benefited from albuterol and so we started Symbicort which was then substituted with [employer] for his insurance formulary. He was also stared on omeprazole by Dr Erik Obey, changed fluticasone to ipratropium nasal spray > cough and mucous are better. He also underwent a CT scan of the chest on 04/04/15 that person reviewed. This showed evidence for several small pulmonary nodules with some patchy subpleural reticulation and traction bronchiectasis consistent with possible early UIP.  He starts that his cough and dyspnea are significantly improved on the new meds.   ROV 11/14/15 -- This is a follow-up visit for patient with a history of tobacco use and newly diagnosed COPD. He also has a history of obstructive sleep apnea that has not been treated  due to CPAP intolerance. He has chronic rhinitis and is on ipratropium nasal spray. Also treated with GERD for omeprazole. Both of these interventions have helped his chronic cough. He has some very mild interstitial changes with associated nodularity on CT scan of the chest, question related to amiodarone exposure. Repeat CT scan of his chest was done on 09/20/15 that I have personally reviewed. This shows stable right lower lobe and left lower lobe pulmonary nodules. No new nodules were seen. There was no change in his bilateral interstitial disease. No auto-immune eval to date.   ROV 03/26/16 -- this is a follow-up visit for COPD. Also w a hx of chronic rhinitis, GERD-related cough. Stable B lower lobe nodular ILD on CT scan, last on 03/19/16 which I have reviewed >> shows no LAD, stable 6x37m LLL nodule, chronic interstitial changes, no new findings. He has remained on amiodarone. He is on loratadine qd, omeprazole. Has atrovent NS to use prn. He recently stopped nasal washes. On symbicort - feels that it has helped his breathing. Denies fever, chills. Coughing up greenish sputum.  ROV 09/26/16 -- patient has a history of COPD, chronic rhinitis, GERD, cough. He also has stable bilateral lower lobe interstitial disease by CT scan of the chest, stable 6 x 8 mm left lower lobe nodule. He reports that he is doing fairly well. He has a lot of nasal gtt, he is on flonase, temporarily stopped ipratropium NS. He feels that lately his gtt is worse - wants to go back on the ipratropium. Remains on loratadine. Rare albuterol use.  Needs ventolin refill.   Remains on amiodarone.           ROV 03/12/17 --77 year old gentleman who follows up today for history of COPD and stable bilateral lower lobe  interstitial disease that we followed by CT scan of the chest.  We never were able to establish a relationship with amiodarone and he remains on the amiodarone now.  A CT scan of the chest was done on 2/26 which I have reviewed.  There has been minimal change and progression since 03/19/16, a more noticeable change compared back to 09/29/13.  He underwent pulmonary function testing today and I have reviewed.  This shows mixed restriction and obstruction on spirometry, no bronchodilator response.  His FEV1 is 2.30 (72% predicted).  This is down slightly from his previous PFTs 02/08/15.  His current lung volumes show restriction, diffusion capacity is decreased but corrects to the normal range when adjusted for his alveolar volume.  Both of these are stable compared with 2017.   He reports that he has had nasal gtt and cough for the last week. Some wheeze - has prompted him to use albuterol bid, which he rarely does. He is concerned that it may have impacted his PFTs     ROV 05/27/17 --follow-up visit today for 77 year old man with a history of CAD, ischemic cardia myopathy, ICD/pacer, obstructive sleep apnea on CPAP.  COPD, stable bilateral lower lobe interstitial disease, mixed obstruction and restriction on spirometry (last PFT 02/2017).  He also has allergic rhinitis with some associated cough. He has been on amiodarone for A fib, stopped since last time, although there is some concern that he is high risk for arrhythmia. The changes on his CT chest are subtle. He reports that he feels well.  He is on symbicort bid, uses albuterol about qd. On flonase, nasal saline, loratadine, omeprazole.                                                                                                                                                                Review of Systems As per HPI      Objective:   Physical Exam Vitals:   05/27/17 1353  BP: 124/80  Pulse: 78  SpO2: 93%  Weight: 217 lb 6.4 oz (98.6 kg)  Height:  _0  (1.778 m)   Gen: Pleasant, well-nourished, in no distress,  normal affect  ENT: No lesions,  mouth clear,  oropharynx clear, no postnasal drip  Neck: No JVD, no TMG, no carotid bruits  Lungs: No use of accessory muscles, clear without rales or rhonchi  Cardiovascular: RRR, heart sounds normal, no murmur or gallops, no peripheral edema  Musculoskeletal: No deformities, no cyanosis or clubbing  Neuro: alert, non focal  Skin: Warm, no lesions or rashes      Assessment & Plan:  Obstructive sleep apnea Continue CPAP qhs  Interstitial lung disease (HCC) Subtle base predominant findings, slight progression on most recent CT scan.  His pulmonary function testing was grossly stable including his diffusion capacity.  His amiodarone was stopped even though it is not a certainty that his CT findings are related to this medication.  In conversation with Dr. Caryl Comes is clear that he does not have many other options and may need to be on it.  We will continue to follow his PFT and CT scan of the chest whether he is on the medication or not and try to give advice about whether we believe this is amiodarone toxicity.  COPD (chronic obstructive pulmonary disease) (HCC) Continue Symbicort, albuterol as he is taking them  Chronic rhinitis Continue Flonase, nasal saline, loratadine.  He can use Atrovent nasal spray as needed.  He has vasomotor rhinitis when he eats dinner and brushes teeth.  These will be good times to use the Atrovent and I explained this to him.  Gastroesophageal reflux disease Continue omeprazole as ordered  Baltazar Apo, MD, PhD 05/27/2017, 2:17 PM [facility] [phone] or if no answer [phone]

## 2017-05-27 NOTE — Assessment & Plan Note (Signed)
Continue Symbicort, albuterol as he is taking them

## 2017-05-27 NOTE — Assessment & Plan Note (Signed)
Subtle base predominant findings, slight progression on most recent CT scan.  His pulmonary function testing was grossly stable including his diffusion capacity.  His amiodarone was stopped even though it is not a certainty that his CT findings are related to this medication.  In conversation with Dr. Caryl Comes is clear that he does not have many other options and may need to be on it.  We will continue to follow his PFT and CT scan of the chest whether he is on the medication or not and try to give advice about whether we believe this is amiodarone toxicity.

## 2017-05-27 NOTE — Assessment & Plan Note (Signed)
Continue Flonase, nasal saline, loratadine.  He can use Atrovent nasal spray as needed.  He has vasomotor rhinitis when he eats dinner and brushes teeth.  These will be good times to use the Atrovent and I explained this to him.

## 2017-05-27 NOTE — Patient Instructions (Signed)
Agree with having more conversation with Dr. Caryl Comes and Dr. Stanford Breed regarding whether you would benefit from being back on amiodarone. Your CT scan of your chest from February does show some evidence for slight progression of interstitial lung disease.  Is unclear whether this is related to amiodarone or not but it could be. Your pulmonary function testing is grossly stable compared with your priors. You need a repeat high-resolution CT scan of your chest in February 2020. You need repeat pulmonary function testing in February 2020. Please continue Symbicort 2 puffs twice a day.  Rinse and gargle after using. Continue Flonase and your nasal saline, loratadine as you have been taking them. You can consider using Atrovent nasal spray as needed for acute episodes of nasal drainage Continue omeprazole as you have been taking it. Follow with Dr Lamonte Sakai in February 2020 after your CT scan and pulmonary function testing have been done, or sooner if you have any changes in your breathing.

## 2017-05-30 DIAGNOSIS — N200 Calculus of kidney: Secondary | ICD-10-CM | POA: Diagnosis not present

## 2017-05-30 DIAGNOSIS — R3912 Poor urinary stream: Secondary | ICD-10-CM | POA: Diagnosis not present

## 2017-05-30 DIAGNOSIS — N401 Enlarged prostate with lower urinary tract symptoms: Secondary | ICD-10-CM | POA: Diagnosis not present

## 2017-05-30 DIAGNOSIS — R350 Frequency of micturition: Secondary | ICD-10-CM | POA: Diagnosis not present

## 2017-06-01 ENCOUNTER — Other Ambulatory Visit: Payer: Self-pay | Admitting: Emergency Medicine

## 2017-06-10 ENCOUNTER — Other Ambulatory Visit: Payer: Self-pay | Admitting: Cardiology

## 2017-06-10 DIAGNOSIS — I429 Cardiomyopathy, unspecified: Secondary | ICD-10-CM

## 2017-06-10 DIAGNOSIS — I480 Paroxysmal atrial fibrillation: Secondary | ICD-10-CM

## 2017-06-10 DIAGNOSIS — I059 Rheumatic mitral valve disease, unspecified: Secondary | ICD-10-CM

## 2017-06-10 DIAGNOSIS — Z95810 Presence of automatic (implantable) cardiac defibrillator: Secondary | ICD-10-CM

## 2017-06-10 NOTE — Telephone Encounter (Signed)
Rx sent to pharmacy   

## 2017-06-10 NOTE — Progress Notes (Addendum)
Subjective:   [name] is a 77 y.o. male who presents for Medicare Annual/Subsequent preventive examination.  Review of Systems:  No ROS.  Medicare Wellness Visit. Additional risk factors are reflected in the social history.  Cardiac Risk Factors include: advanced age (>13mn, >>40women);dyslipidemia;male gender;hypertension;obesity (BMI >30kg/m2);family history of premature cardiovascular disease   Sleep patterns: Sleeps well, Up to void x 2.  Home Safety/Smoke Alarms: Feels safe in home. Smoke alarms in place.  Living environment; residence and Firearm Safety: Lives with wife and 3 cats in 1 story home. Seat Belt Safety/Bike Helmet: Wears seat belt.   Male:   CCS-colonoscopy 08/18/2015, polyp. Recall 5 years     PSA-Followed by [facility], pt reports normal.  Lab Results  Component Value Date   PSA 1.59 12/12/2015       Objective:    Vitals: BP 108/60 (BP Location: Left Arm, Patient Position: Sitting, Cuff Size: Normal)   Pulse 61   Temp 98 F (36.7 C) (Temporal)   Resp 18   Ht _0  (1.778 m)   Wt 214 lb 6 oz (97.2 kg)   SpO2 98%   BMI 30.76 kg/m   Body mass index is 30.76 kg/m.  Advanced Directives 06/11/2017 12/27/2015 08/18/2015 08/11/2015 09/25/2012 07/13/2012 12/26/2011  Does Patient Have a Medical Advance Directive? Yes Yes Yes Yes Patient has advance directive, copy not in chart Patient has advance directive, copy not in chart Patient has advance directive, copy not in chart  Type of Advance Directive [place] will Living will;Healthcare Power of [place]Living will [place]Living will [place]Living will [place]Living will [place]Mc[place]aeLiving will  Does patient want to make changes to medical advance directive? - No - Patient declined - No - Patient declined - - -  Copy of [place] Chart? No - copy  requested No - copy requested No - copy requested - Copy requested from family - Copy requested from family  Pre-existing out of facility DNR order (yellow form or pink MOST form) - - - - No No No    Tobacco Social History   Tobacco Use  Smoking Status Former Smoker  . Packs/day: 2.00  . Years: 15.00  . Pack years: 30.00  . Types: Cigarettes  . Last attempt to quit: 01/15/1980  . Years since quitting: 37.4  Smokeless Tobacco Never Used     Counseling given: Not Answered    Past Medical History:  Diagnosis Date  . Adenomatous colon polyp 2009; 06/2012   Colonoscopy 07/2007---Dr. MBarron SchmidGI--Repeat 06/2012 showed one tubular adenoma w/out high grade dysplasia.  .Marland KitchenAICD (automatic cardioverter/defibrillator) present    BPac[employer] Mutual . Atrial fibrillation ([place]   . Bilateral sensorineural hearing loss   . BPH (benign prostatic hypertrophy)    PSAs ok (followed by urologist): no LUTS  . CAD (coronary artery disease)    PTCA RCA 1997 (Inf/post MI);  . CHF (congestive heart failure) ([place]   . Chronic constipation   . Chronic rhinitis    ?vasomotor (Dr. wErik Obey  . COPD (chronic obstructive pulmonary disease) ([place]   . Diverticulosis of colon 2005   Noted on Colonoscopies in 2005 and 2009 and on CT 2010  . Gallstones 06/2014   No evidence of cholecystitis on abd u/s 06/2014  . GERD (gastroesophageal reflux disease)   . Hepatic steatosis 2010; 2016   Noted on CT abd  2010 and on u/s abd 04/2009 (transaminasemia) and u/s 06/2014.  [name] Hyperlipidemia   . Hypertension   . Ischemic cardiomyopathy   . Microscopic hematuria    Cysto neg 02/2009 except for BPH-likely has microhem from his renal stone dz  . Nephrolithiasis    No distal stones on CT 2010; stable 8 mm RLP stone, 42m LMP stone 10/2013 plain film.  Abd u/s 06/2014: 9 mm nonobstructing lower pole right renal stone.  03/2015 urol: stable nonobstructing renal calc by CT 12/2014 and KUB 03/2015.  .Marland KitchenObstructive sleep apnea    no  cpap uses since weight loss of 50 pounds  . Presence of permanent cardiac pacemaker    BPac[employer] Mutual . Squamous cell carcinoma of back   . Ventricular tachycardia ([facility]    Past Surgical History:  Procedure Laterality Date  . ATRIAL ABLATION SURGERY     flutter ablation 2002  . BREAST SURGERY Left 1990   "Tissue removed from breast"  . CARDIAC CATHETERIZATION    . carotid doppler  09/25/12   No signif stenosis  . COLONOSCOPY N/A 07/13/2012   Diverticula ascending colon.  Polyp x 1-recall 5 yrs (Dr. MWatt Climes Procedure: COLONOSCOPY;  Surgeon: MJeryl Columbia MD;  Location: [facility];  Service: Endoscopy;  Laterality: N/A; polypectomy x 1   . COLONOSCOPY WITH PROPOFOL N/A 08/18/2015   Procedure: COLONOSCOPY WITH PROPOFOL;  Surgeon: KMauri Pole MD;  Location: [facility];  Service: Endoscopy;  Laterality: N/A;  . ESOPHAGOGASTRODUODENOSCOPY N/A 07/13/2012   Procedure: ESOPHAGOGASTRODUODENOSCOPY (EGD);  Surgeon: MJeryl Columbia MD;  [facility];  Service: Endoscopy;  Laterality: N/A;  NORMAL  . EXTRACORPOREAL SHOCK WAVE LITHOTRIPSY    . HEMORRHOID BANDING    . ICD     a) Guidant Contak H170- SVirl Axe MD 07/25/06 (second device) b) Medtronic CRT-D  . INGUINAL HERNIA REPAIR Bilateral 1975  . LEAD REVISION Bilateral 12/26/2011   Procedure: LEAD REVISION;  Surgeon: GEvans Lance MD;  [facility];  Service: Cardiovascular;  Laterality: Bilateral;  . PFTs  02/2015   Mod restriction (interstitial), mod diffusion defect (Dr. YAnnamaria Boots  . precancer area keratsis removed  05/2015    leftf [place] and left arm and right arm and back   Family History  Problem Relation Age of Onset  . Heart disease Mother   . Other Mother        colon surgery for fistula  . Gallstones Mother   . Breast cancer Maternal Grandmother   . Coronary artery disease Father        family hx of  . Heart attack Father        4 stents/ pacemaker  . Nephrolithiasis Father   . Hypertension  Sister   . Goiter Paternal Grandmother   . Nephrolithiasis Son   . Colon cancer Neg Hx    Social History   Socioeconomic History  . Marital status: Married    Spouse name: Not on file  . Number of children: 3  . Years of education: Not on file  . Highest education level: Not on file  Occupational History  . Occupation: Retired    EFish farm manager RETIRED  Social Needs  . Financial resource strain: Not on file  . Food insecurity:    Worry: Not on file    Inability: Not on file  . Transportation needs:    Medical: Not on file    Non-medical: Not on file  Tobacco Use  . Smoking status: Former  Smoker    Packs/day: 2.00    Years: 15.00    Pack years: 30.00    Types: Cigarettes    Last attempt to quit: 01/15/1980    Years since quitting: 37.4  . Smokeless tobacco: Never Used  Substance and Sexual Activity  . Alcohol use: No    Alcohol/week: 0.0 oz  . Drug use: No  . Sexual activity: Not on file  Lifestyle  . Physical activity:    Days per week: Not on file    Minutes per session: Not on file  . Stress: Not on file  Relationships  . Social connections:    Talks on phone: Not on file    Gets together: Not on file    Attends religious service: Not on file    Active member of club or organization: Not on file    Attends meetings of clubs or organizations: Not on file    Relationship status: Not on file  Other Topics Concern  . Not on file  Social History Narrative   Married.   Hx of cigarettes: quit in the 1980s.  No alc/drugs.    Outpatient Encounter Medications as of 06/11/2017  Medication Sig  . antiseptic oral rinse (BIOTENE) LIQD 15 mLs by Mouth Rinse route as needed for dry mouth (TID).  [name] carvedilol (COREG) 12.5 MG tablet TAKE 1 TABLET BY MOUTH TWICE DAILY WITH A MEAL. <PLEASE MAKE APPOINTMENT FOR REFILLS>  . cholecalciferol (VITAMIN D) 1000 units tablet Take 1,000 Units by mouth daily.  [name] eplerenone (INSPRA) 25 MG tablet Take 1 tablet (25 mg total) by mouth daily.    [name] ezetimibe (ZETIA) 10 MG tablet TAKE 1 TABLET(10 MG) BY MOUTH DAILY  . fluticasone (FLONASE) 50 MCG/ACT nasal spray SHAKE LIQUID AND USE 2 SPRAYS IN EACH NOSTRIL DAILY  . furosemide (LASIX) 40 MG tablet TAKE 2 TABLETS(80 MG) BY MOUTH DAILY  . ipratropium (ATROVENT) 0.06 % nasal spray USE 2 SPRAYS IN EACH NOSTRIL THREE TIMES DAILY  . loratadine (CLARITIN) 10 MG tablet Take 10 mg by mouth daily.  . Magnesium 250 MG TABS Take 1 tablet by mouth daily.  [name] mexiletine (MEXITIL) 150 MG capsule TAKE 2 CAPSULES(300 MG) BY MOUTH TWICE DAILY  . Multiple Vitamin (MULTIVITAMIN WITH MINERALS) TABS Take 1 tablet by mouth daily.  [name] omeprazole (PRILOSEC) 20 MG capsule Take 20 mg by mouth daily.  . polyethylene glycol (MIRALAX / GLYCOLAX) packet Take 17 g by mouth daily.  . polyvinyl alcohol-povidone (REFRESH) 1.4-0.6 % ophthalmic solution Place 1-2 drops into both eyes as needed (dry eyes).   . potassium chloride SA (K-DUR,KLOR-CON) 20 MEQ tablet Take 1 tablet (20 mEq total) by mouth daily.  . Probiotic Product (PROBIOTIC DAILY PO) Take 1 tablet by mouth daily.  . rivaroxaban (XARELTO) 20 MG TABS tablet TAKE 1 TABLET(20 MG) BY MOUTH DAILY WITH DINNER  . sacubitril-valsartan (ENTRESTO) 24-26 MG Take 1 tablet by mouth 2 (two) times daily.  . SYMBICORT 160-4.5 MCG/ACT inhaler INHALE 2 PUFFS INTO THE LUNGS TWICE DAILY  . VENTOLIN HFA 108 (90 Base) MCG/ACT inhaler INHALE 1 TO 2 PUFFS INTO THE LUNGS EVERY 4 HOURS AS NEEDED FOR WHEEZING OR SHORTNESS OF BREATH  . Wheat Dextrin (BENEFIBER PO) Take 2 Scoops by mouth 2 (two) times daily.  . AMBULATORY NON FORMULARY MEDICATION Medication Name: Nitroglycerin ointment 0.125% Use pea sized amount 3-4 times a day per rectum (Patient not taking: Reported on 06/11/2017)  . amiodarone (PACERONE) 200 MG tablet   . nitroGLYCERIN (  NITROSTAT) 0.4 MG SL tablet DISSOLVE 1 TABLET UNDER THE TONGUE EVERY 5 MINUTES AS NEEDED FOR CHEST PAIN  . [DISCONTINUED] fluticasone (FLONASE) 50 MCG/ACT  nasal spray SHAKE LIQUID AND USE 2 SPRAYS IN EACH NOSTRIL DAILY  . [DISCONTINUED] mexiletine (MEXITIL) 150 MG capsule TAKE 2 CAPSULES(300 MG) BY MOUTH TWICE DAILY   No facility-administered encounter medications on file as of 06/11/2017.     Activities of Daily Living In your present state of health, do you have any difficulty performing the following activities: 06/11/2017 03/06/2017  Hearing? N N  Vision? N N  Difficulty concentrating or making decisions? N N  Walking or climbing stairs? N N  Dressing or bathing? N N  Doing errands, shopping? N N  Preparing Food and eating ? N -  Using the Toilet? N -  In the past six months, have you accidently leaked urine? N -  Do you have problems with loss of bowel control? N -  Managing your Medications? N -  Managing your Finances? N -  Housekeeping or managing your Housekeeping? N -  Some recent data might be hidden    Patient Care Team: [name], MD as PCP - General (Family Medicine) Festus Aloe, MD as Consulting Physician (Urology) Michael Boston, MD as Consulting Physician (General Surgery) Stanford Breed, Denice Bors, MD as Consulting Physician (Cardiology) Jodi Marble, MD as Consulting Physician (Otolaryngology) Deboraha Sprang, MD as Consulting Physician (Cardiology) Luberta Mutter, MD as Consulting Physician (Ophthalmology) Lamonte Sakai Rose Fillers, MD as Consulting Physician (Pulmonary Disease) Mauri Pole, MD as Consulting Physician (Gastroenterology)   Assessment:   This is a routine wellness examination for Cameren.  Exercise Activities and Dietary recommendations Current Exercise Habits: The patient does not participate in regular exercise at present(Stays active completing yard work), Exercise limited by: None identified   Diet (meal preparation, eat out, water intake, caffeinated beverages, dairy products, fruits and vegetables): Drinks water and 2 diet cokes.   Breakfast: applesauce Lunch: Protein, Fruits and  vegetables. Salad 3/week, fish 2/week Dinner: sandwich and fruit.      Goals    . Weight (lb) < 200 lb (90.7 kg)     Lose 18 pounds by increasing activity.     . Weight (lb) < 200 lb (90.7 kg)     Lose weight by decreasing caloric intake and maintain activity.        Fall Risk Fall Risk  06/11/2017 03/06/2017 12/27/2015 07/10/2015  Falls in the past year? Yes No Yes Yes  Number falls in past yr: 2 or more - 2 or more 2 or more  Injury with Fall? No - No No  Risk Factor Category  High Fall Risk - - -  Risk for fall due to : Other (Comment) - History of fall(s) -  Risk for fall due to: Comment "not paying attention" - - -  Follow up Falls prevention discussed - Falls prevention discussed -  Comment - - Not paying attention to steps -    Depression Screen PHQ 2/9 Scores 06/11/2017 03/06/2017 06/25/2016 12/27/2015  PHQ - 2 Score 1 0 0 0  PHQ- 9 Score - 0 0 -    Cognitive Function MMSE - Mini Mental State Exam 06/11/2017  Orientation to time 5  Orientation to Place 5  Registration 3  Attention/ Calculation 5  Recall 2  Language- name 2 objects 2  Language- repeat 1  Language- follow 3 step command 3  Language- read & follow direction 1  Write a  sentence 1  Copy design 1  Total score 29        Immunization History  Administered Date(s) Administered  . Influenza Split 11/11/2010, 11/02/2012, 10/14/2013  . Influenza Whole 09/27/2011, 11/13/2011  . Influenza, High Dose Seasonal PF 09/26/2015, 10/07/2016  . Influenza,inj,Quad PF,6+ Mos 11/03/2016  . Influenza-Unspecified 09/26/2014  . Pneumococcal Conjugate-13 06/15/2014  . Pneumococcal Polysaccharide-23 03/15/2002, 07/10/2015  . Tdap 04/04/2010  . Zoster 05/15/2015  . Zoster Recombinat (Shingrix) 03/29/2016    Screening Tests Health Maintenance  Topic Date Due  . INFLUENZA VACCINE  08/14/2017  . TETANUS/TDAP  04/03/2020  . COLONOSCOPY  08/17/2020  . PNA vac Low Risk Adult  Completed        Plan:    Bring a  copy of your living will and/or healthcare power of attorney to your next office visit.  Continue doing brain stimulating activities (puzzles, reading, adult coloring books, staying active) to keep memory sharp.   I have personally reviewed and noted the following in the patient's chart:   . Medical and social history . Use of alcohol, tobacco or illicit drugs  . Current medications and supplements . Functional ability and status . Nutritional status . Physical activity . Advanced directives . List of other physicians . Hospitalizations, surgeries, and ER visits in previous 12 months . Vitals . Screenings to include cognitive, depression, and falls . Referrals and appointments  In addition, I have reviewed and discussed with patient certain preventive protocols, quality metrics, and best practice recommendations. A written personalized care plan for preventive services as well as general preventive health recommendations were provided to patient.     Gerilyn Nestle, RN  06/11/2017   Reviewed documentation provided by RN and agree w/ above.  [name], MD

## 2017-06-11 ENCOUNTER — Encounter: Payer: Self-pay | Admitting: Family Medicine

## 2017-06-11 ENCOUNTER — Ambulatory Visit (INDEPENDENT_AMBULATORY_CARE_PROVIDER_SITE_OTHER): Payer: Medicare Other

## 2017-06-11 ENCOUNTER — Other Ambulatory Visit: Payer: Self-pay

## 2017-06-11 ENCOUNTER — Ambulatory Visit (INDEPENDENT_AMBULATORY_CARE_PROVIDER_SITE_OTHER): Payer: Medicare Other | Admitting: Family Medicine

## 2017-06-11 VITALS — BP 108/60 | HR 61 | Temp 98.0°F | Resp 18 | Ht 70.0 in | Wt 214.4 lb

## 2017-06-11 DIAGNOSIS — E559 Vitamin D deficiency, unspecified: Secondary | ICD-10-CM | POA: Diagnosis not present

## 2017-06-11 DIAGNOSIS — I5022 Chronic systolic (congestive) heart failure: Secondary | ICD-10-CM

## 2017-06-11 DIAGNOSIS — J449 Chronic obstructive pulmonary disease, unspecified: Secondary | ICD-10-CM | POA: Diagnosis not present

## 2017-06-11 DIAGNOSIS — E7849 Other hyperlipidemia: Secondary | ICD-10-CM

## 2017-06-11 DIAGNOSIS — Z0000 Encounter for general adult medical examination without abnormal findings: Secondary | ICD-10-CM | POA: Diagnosis not present

## 2017-06-11 DIAGNOSIS — I4729 Other ventricular tachycardia: Secondary | ICD-10-CM

## 2017-06-11 DIAGNOSIS — I472 Ventricular tachycardia: Secondary | ICD-10-CM | POA: Diagnosis not present

## 2017-06-11 NOTE — Patient Instructions (Signed)
Follow up in 6 months to recheck BP and cholesterol We'll notify you of your lab results and make any changes if needed Keep up the good work on healthy diet and regular exercise- you look great! We'll call you with the appt for the 2nd opinion Call with any questions or concerns Have a great summer!!

## 2017-06-11 NOTE — Progress Notes (Signed)
   Subjective:    Patient ID: [name], male    DOB: 1940-08-15, 77 y.o.   MRN: [id number]  HPI Hyperlipidemia- chronic problem, on Zetia 15m daily.  Denies abd pain, N/V, myalgias.  Ventricular tachycardia- chronic problem.  Pt has been seeing Dr KCaryl Comesand was on Amiodarone until mid April.  Dr KCaryl Comesis not aware that pt stopped medication.  Pt reports feeling better since stopping medication.  Pt is interested in a 2nd opinion b/c he knows Dr KCaryl Comeswants him on Amiodarone but he has serious concerns about continuing.  No CP, irregular HR.  CHF- chronic problem, following w/ Dr CStanford Breed  No upcoming appt.  No edema.  COPD- chronic problem, following w/ Dr BLamonte Sakai  Pt reports lungs are feeling better since stopping Amiodarone.  Pt uses Symbicort daily and is concerned about possible thrush (he has had this previously).  Currently asymptomatic.   Review of Systems For ROS see HPI     Objective:   Physical Exam  Constitutional: He is oriented to person, place, and time. He appears well-developed and well-nourished. No distress.  HENT:  Head: Normocephalic and atraumatic.  Mouth/Throat: Oropharynx is clear and moist.  Eyes: Pupils are equal, round, and reactive to light. Conjunctivae and EOM are normal.  Neck: Normal range of motion. Neck supple. No thyromegaly present.  Cardiovascular: Normal rate, regular rhythm, normal heart sounds and intact distal pulses.  No murmur heard. Pulmonary/Chest: Effort normal and breath sounds normal. No respiratory distress.  Abdominal: Soft. Bowel sounds are normal. He exhibits no distension.  Musculoskeletal: He exhibits no edema.  Lymphadenopathy:    He has no cervical adenopathy.  Neurological: He is alert and oriented to person, place, and time. No cranial nerve deficit.  Skin: Skin is warm and dry.  Psychiatric: He has a normal mood and affect. His behavior is normal.  Vitals reviewed.         Assessment & Plan:

## 2017-06-11 NOTE — Patient Instructions (Addendum)
Bring a copy of your living will and/or healthcare power of attorney to your next office visit.  Continue doing brain stimulating activities (puzzles, reading, adult coloring books, staying active) to keep memory sharp.    Health Maintenance, Male A healthy lifestyle and preventive care is important for your health and wellness. Ask your health care provider about what schedule of regular examinations is right for you. What should I know about weight and diet? Eat a Healthy Diet  Eat plenty of vegetables, fruits, whole grains, low-fat dairy products, and lean protein.  Do not eat a lot of foods high in solid fats, added sugars, or salt.  Maintain a Healthy Weight Regular exercise can help you achieve or maintain a healthy weight. You should:  Do at least 150 minutes of exercise each week. The exercise should increase your heart rate and make you sweat (moderate-intensity exercise).  Do strength-training exercises at least twice a week.  Watch Your Levels of Cholesterol and Blood Lipids  Have your blood tested for lipids and cholesterol every 5 years starting at 76 years of age. If you are at high risk for heart disease, you should start having your blood tested when you are 76 years old. You may need to have your cholesterol levels checked more often if: ? Your lipid or cholesterol levels are high. ? You are older than 76 years of age. ? You are at high risk for heart disease.  What should I know about cancer screening? Many types of cancers can be detected early and may often be prevented. Lung Cancer  You should be screened every year for lung cancer if: ? You are a current smoker who has smoked for at least 30 years. ? You are a former smoker who has quit within the past 15 years.  Talk to your health care provider about your screening options, when you should start screening, and how often you should be screened.  Colorectal Cancer  Routine colorectal cancer screening  usually begins at 76 years of age and should be repeated every 5-10 years until you are 76 years old. You may need to be screened more often if early forms of precancerous polyps or small growths are found. Your health care provider may recommend screening at an earlier age if you have risk factors for colon cancer.  Your health care provider may recommend using home test kits to check for hidden blood in the stool.  A small camera at the end of a tube can be used to examine your colon (sigmoidoscopy or colonoscopy). This checks for the earliest forms of colorectal cancer.  Prostate and Testicular Cancer  Depending on your age and overall health, your health care provider may do certain tests to screen for prostate and testicular cancer.  Talk to your health care provider about any symptoms or concerns you have about testicular or prostate cancer.  Skin Cancer  Check your skin from head to toe regularly.  Tell your health care provider about any new moles or changes in moles, especially if: ? There is a change in a mole's size, shape, or color. ? You have a mole that is larger than a pencil eraser.  Always use sunscreen. Apply sunscreen liberally and repeat throughout the day.  Protect yourself by wearing long sleeves, pants, a wide-brimmed hat, and sunglasses when outside.  What should I know about heart disease, diabetes, and high blood pressure?  If you are 66-73 years of age, have your blood pressure checked every  3-5 years. If you are 48 years of age or older, have your blood pressure checked every year. You should have your blood pressure measured twice-once when you are at a hospital or clinic, and once when you are not at a hospital or clinic. Record the average of the two measurements. To check your blood pressure when you are not at a hospital or clinic, you can use: ? An automated blood pressure machine at a pharmacy. ? A home blood pressure monitor.  Talk to your health care  provider about your target blood pressure.  If you are between 39-67 years old, ask your health care provider if you should take aspirin to prevent heart disease.  Have regular diabetes screenings by checking your fasting blood sugar level. ? If you are at a normal weight and have a low risk for diabetes, have this test once every three years after the age of 56. ? If you are overweight and have a high risk for diabetes, consider being tested at a younger age or more often.  A one-time screening for abdominal aortic aneurysm (AAA) by ultrasound is recommended for men aged 79-75 years who are current or former smokers. What should I know about preventing infection? Hepatitis B If you have a higher risk for hepatitis B, you should be screened for this virus. Talk with your health care provider to find out if you are at risk for hepatitis B infection. Hepatitis C Blood testing is recommended for:  Everyone born from [birth year] through 1964.  Anyone with known risk factors for hepatitis C.  Sexually Transmitted Diseases (STDs)  You should be screened each year for STDs including gonorrhea and chlamydia if: ? You are sexually active and are younger than 76 years of age. ? You are older than 76 years of age and your health care provider tells you that you are at risk for this type of infection. ? Your sexual activity has changed since you were last screened and you are at an increased risk for chlamydia or gonorrhea. Ask your health care provider if you are at risk.  Talk with your health care provider about whether you are at high risk of being infected with HIV. Your health care provider may recommend a prescription medicine to help prevent HIV infection.  What else can I do?  Schedule regular health, dental, and eye exams.  Stay current with your vaccines (immunizations).  Do not use any tobacco products, such as cigarettes, chewing tobacco, and e-cigarettes. If you need help quitting, ask  your health care provider.  Limit alcohol intake to no more than 2 drinks per day. One drink equals 12 ounces of beer, 5 ounces of wine, or 1 ounces of hard liquor.  Do not use street drugs.  Do not share needles.  Ask your health care provider for help if you need support or information about quitting drugs.  Tell your health care provider if you often feel depressed.  Tell your health care provider if you have ever been abused or do not feel safe at home. This information is not intended to replace advice given to you by your health care provider. Make sure you discuss any questions you have with your health care provider. Document Released: 06/29/2007 Document Revised: 08/30/2015 Document Reviewed: 10/04/2014 Elsevier Interactive Patient Education  [employer].

## 2017-06-12 ENCOUNTER — Encounter: Payer: Self-pay | Admitting: General Practice

## 2017-06-12 LAB — LIPID PANEL
CHOLESTEROL: 243 mg/dL — AB (ref <200)
HDL: 36 mg/dL — AB (ref >40)
LDL Cholesterol (Calc): 169 mg/dL (calc) — ABNORMAL HIGH
Non-HDL Cholesterol (Calc): 207 mg/dL (calc) — ABNORMAL HIGH (ref <130)
Total CHOL/HDL Ratio: 6.8 (calc) — ABNORMAL HIGH (ref <5.0)
Triglycerides: 227 mg/dL — ABNORMAL HIGH (ref <150)

## 2017-06-12 LAB — CBC WITH DIFFERENTIAL/PLATELET
BASOS ABS: 54 {cells}/uL (ref 0–200)
Basophils Relative: 0.7 %
Eosinophils Absolute: 377 cells/uL (ref 15–500)
Eosinophils Relative: 4.9 %
HCT: 44.3 % (ref 38.5–50.0)
Hemoglobin: 15.5 g/dL (ref 13.2–17.1)
LYMPHS ABS: 2102 {cells}/uL (ref 850–3900)
MCH: 35.1 pg — AB (ref 27.0–33.0)
MCHC: 35 g/dL (ref 32.0–36.0)
MCV: 100.2 fL — AB (ref 80.0–100.0)
MPV: 9.6 fL (ref 7.5–12.5)
Monocytes Relative: 8.6 %
NEUTROS PCT: 58.5 %
Neutro Abs: 4505 cells/uL (ref 1500–7800)
Platelets: 334 10*3/uL (ref 140–400)
RBC: 4.42 10*6/uL (ref 4.20–5.80)
RDW: 12.8 % (ref 11.0–15.0)
TOTAL LYMPHOCYTE: 27.3 %
WBC: 7.7 10*3/uL (ref 3.8–10.8)
WBCMIX: 662 {cells}/uL (ref 200–950)

## 2017-06-12 LAB — HEPATIC FUNCTION PANEL
AG Ratio: 1.6 (calc) (ref 1.0–2.5)
ALBUMIN MSPROF: 4.2 g/dL (ref 3.6–5.1)
ALT: 27 U/L (ref 9–46)
AST: 27 U/L (ref 10–35)
Alkaline phosphatase (APISO): 61 U/L (ref 40–115)
BILIRUBIN DIRECT: 0.1 mg/dL (ref 0.0–0.2)
BILIRUBIN INDIRECT: 0.5 mg/dL (ref 0.2–1.2)
BILIRUBIN TOTAL: 0.6 mg/dL (ref 0.2–1.2)
GLOBULIN: 2.6 g/dL (ref 1.9–3.7)
Total Protein: 6.8 g/dL (ref 6.1–8.1)

## 2017-06-12 LAB — BASIC METABOLIC PANEL
BUN: 14 mg/dL (ref 7–25)
CHLORIDE: 103 mmol/L (ref 98–110)
CO2: 23 mmol/L (ref 20–32)
CREATININE: 0.95 mg/dL (ref 0.70–1.18)
Calcium: 9 mg/dL (ref 8.6–10.3)
Glucose, Bld: 95 mg/dL (ref 65–99)
POTASSIUM: 4 mmol/L (ref 3.5–5.3)
SODIUM: 136 mmol/L (ref 135–146)

## 2017-06-12 LAB — TSH: TSH: 2.4 mIU/L (ref 0.40–4.50)

## 2017-06-12 LAB — VITAMIN D 25 HYDROXY (VIT D DEFICIENCY, FRACTURES): Vit D, 25-Hydroxy: 36 ng/mL (ref 30–100)

## 2017-06-17 ENCOUNTER — Other Ambulatory Visit: Payer: Self-pay | Admitting: Family Medicine

## 2017-07-02 ENCOUNTER — Ambulatory Visit (INDEPENDENT_AMBULATORY_CARE_PROVIDER_SITE_OTHER): Payer: Medicare Other | Admitting: *Deleted

## 2017-07-02 DIAGNOSIS — I472 Ventricular tachycardia: Secondary | ICD-10-CM

## 2017-07-02 DIAGNOSIS — I429 Cardiomyopathy, unspecified: Secondary | ICD-10-CM

## 2017-07-02 DIAGNOSIS — I4729 Other ventricular tachycardia: Secondary | ICD-10-CM

## 2017-07-02 NOTE — Progress Notes (Signed)
Remote ICD transmission.   

## 2017-07-08 ENCOUNTER — Other Ambulatory Visit: Payer: Self-pay | Admitting: Cardiology

## 2017-07-08 ENCOUNTER — Other Ambulatory Visit: Payer: Self-pay | Admitting: Emergency Medicine

## 2017-07-15 ENCOUNTER — Other Ambulatory Visit: Payer: Self-pay | Admitting: Family Medicine

## 2017-07-17 ENCOUNTER — Other Ambulatory Visit: Payer: Self-pay | Admitting: Cardiology

## 2017-07-17 DIAGNOSIS — I429 Cardiomyopathy, unspecified: Secondary | ICD-10-CM

## 2017-07-17 DIAGNOSIS — I059 Rheumatic mitral valve disease, unspecified: Secondary | ICD-10-CM

## 2017-07-17 DIAGNOSIS — Z95810 Presence of automatic (implantable) cardiac defibrillator: Secondary | ICD-10-CM

## 2017-07-17 DIAGNOSIS — I4891 Unspecified atrial fibrillation: Secondary | ICD-10-CM

## 2017-07-17 DIAGNOSIS — I480 Paroxysmal atrial fibrillation: Secondary | ICD-10-CM

## 2017-07-22 ENCOUNTER — Other Ambulatory Visit: Payer: Self-pay | Admitting: Cardiology

## 2017-07-22 LAB — CUP PACEART REMOTE DEVICE CHECK
Battery Remaining Longevity: 30 mo
Brady Statistic RV Percent Paced: 100 %
Date Time Interrogation Session: 20190619070100
HIGH POWER IMPEDANCE MEASURED VALUE: 55 Ohm
Implantable Lead Implant Date: 20131212
Implantable Lead Location: 753858
Implantable Lead Location: 753860
Implantable Lead Model: 6940
Implantable Lead Serial Number: 120244
Implantable Pulse Generator Implant Date: 20111207
Lead Channel Impedance Value: 671 Ohm
Lead Channel Pacing Threshold Amplitude: 0.7 V
Lead Channel Pacing Threshold Amplitude: 1 V
Lead Channel Pacing Threshold Amplitude: 1.8 V
Lead Channel Pacing Threshold Pulse Width: 0.3 ms
Lead Channel Pacing Threshold Pulse Width: 0.4 ms
Lead Channel Setting Pacing Amplitude: 2.5 V
Lead Channel Setting Pacing Pulse Width: 0.4 ms
Lead Channel Setting Pacing Pulse Width: 1 ms
Lead Channel Setting Sensing Sensitivity: 0.5 mV
MDC IDC LEAD IMPLANT DT: 20020527
MDC IDC LEAD IMPLANT DT: 20020527
MDC IDC LEAD LOCATION: 753859
MDC IDC MSMT BATTERY REMAINING PERCENTAGE: 50 %
MDC IDC MSMT LEADCHNL LV IMPEDANCE VALUE: 917 Ohm
MDC IDC MSMT LEADCHNL LV PACING THRESHOLD PULSEWIDTH: 1 ms
MDC IDC MSMT LEADCHNL RA IMPEDANCE VALUE: 608 Ohm
MDC IDC PG SERIAL: 492371
MDC IDC SET LEADCHNL LV PACING AMPLITUDE: 2.5 V
MDC IDC SET LEADCHNL LV SENSING SENSITIVITY: 1 mV
MDC IDC SET LEADCHNL RA PACING AMPLITUDE: 2 V
MDC IDC STAT BRADY RA PERCENT PACED: 98 %

## 2017-07-23 DIAGNOSIS — Z8679 Personal history of other diseases of the circulatory system: Secondary | ICD-10-CM | POA: Diagnosis not present

## 2017-07-23 DIAGNOSIS — Z79899 Other long term (current) drug therapy: Secondary | ICD-10-CM | POA: Diagnosis not present

## 2017-07-23 DIAGNOSIS — R002 Palpitations: Secondary | ICD-10-CM | POA: Diagnosis not present

## 2017-07-23 DIAGNOSIS — I2582 Chronic total occlusion of coronary artery: Secondary | ICD-10-CM | POA: Diagnosis not present

## 2017-07-23 DIAGNOSIS — I255 Ischemic cardiomyopathy: Secondary | ICD-10-CM | POA: Diagnosis not present

## 2017-07-23 DIAGNOSIS — J449 Chronic obstructive pulmonary disease, unspecified: Secondary | ICD-10-CM | POA: Diagnosis not present

## 2017-07-23 DIAGNOSIS — I472 Ventricular tachycardia: Secondary | ICD-10-CM | POA: Diagnosis not present

## 2017-07-23 DIAGNOSIS — J8417 Other interstitial pulmonary diseases with fibrosis in diseases classified elsewhere: Secondary | ICD-10-CM | POA: Diagnosis not present

## 2017-07-23 DIAGNOSIS — I5022 Chronic systolic (congestive) heart failure: Secondary | ICD-10-CM | POA: Diagnosis not present

## 2017-07-23 DIAGNOSIS — Z7901 Long term (current) use of anticoagulants: Secondary | ICD-10-CM | POA: Diagnosis not present

## 2017-07-23 DIAGNOSIS — Z95810 Presence of automatic (implantable) cardiac defibrillator: Secondary | ICD-10-CM | POA: Diagnosis not present

## 2017-07-23 DIAGNOSIS — Z9861 Coronary angioplasty status: Secondary | ICD-10-CM | POA: Diagnosis not present

## 2017-07-23 DIAGNOSIS — Z9889 Other specified postprocedural states: Secondary | ICD-10-CM | POA: Diagnosis not present

## 2017-07-29 DIAGNOSIS — L570 Actinic keratosis: Secondary | ICD-10-CM | POA: Diagnosis not present

## 2017-07-29 DIAGNOSIS — L821 Other seborrheic keratosis: Secondary | ICD-10-CM | POA: Diagnosis not present

## 2017-08-05 IMAGING — CT CT CHEST HIGH RESOLUTION W/O CM
4 of 5 series · 16 of 36 positions shown, 17 images · non-contrast
Comparison: 09/29/2013 chest CT angiogram. 11/01/2014 chest
radiograph.

CLINICAL DATA: Reported history of interstitial lung disease.
Patient reports a productive cough.

EXAM:
CT CHEST WITHOUT CONTRAST
TECHNIQUE: Multidetector CT imaging of the chest was performed following the
standard protocol without intravenous contrast. High resolution
imaging of the lungs, as well as inspiratory and expiratory imaging,
was performed.

[Series 2: inspiration · axial · 0.72mm/px · z∈[-242,-62]mm · 4 of 22 slices shown, 5 images]
[im 5/22  mediastinal]
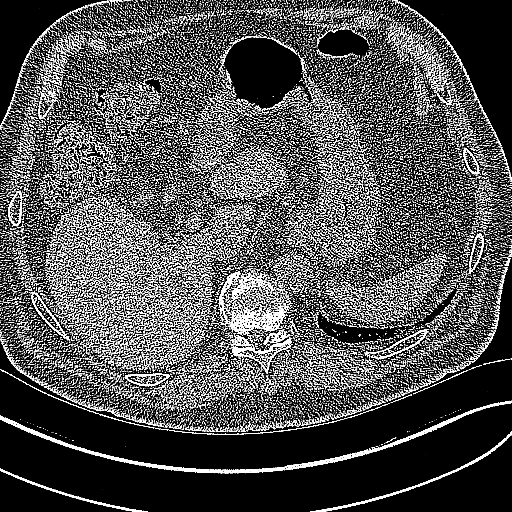
[im 5/22  lung]
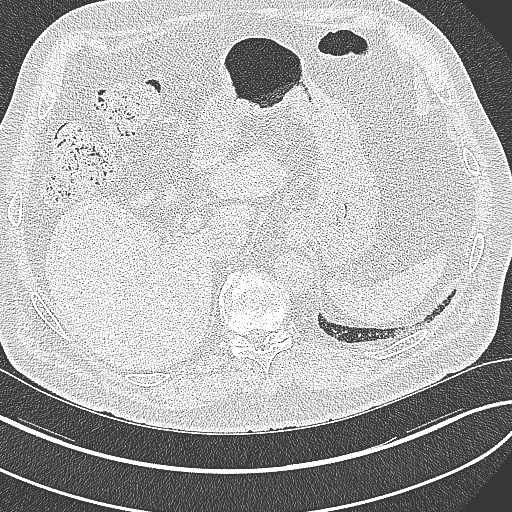
[im 9/22  lung]
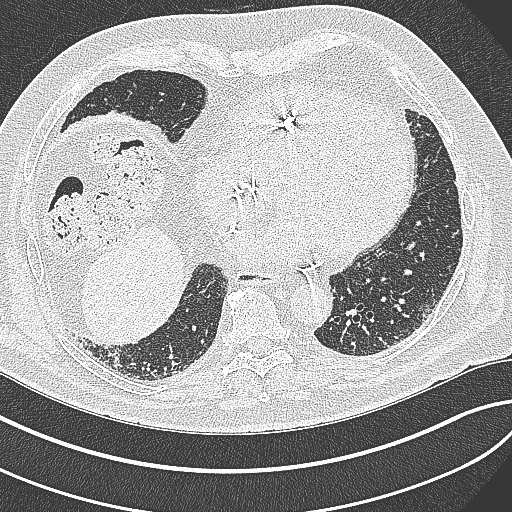
[im 13/22  lung]
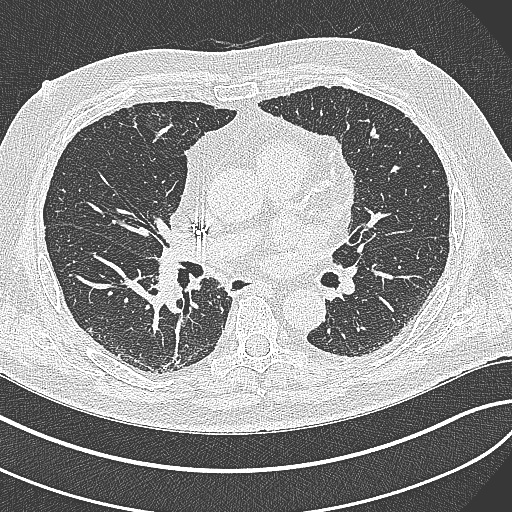
[im 17/22  lung]
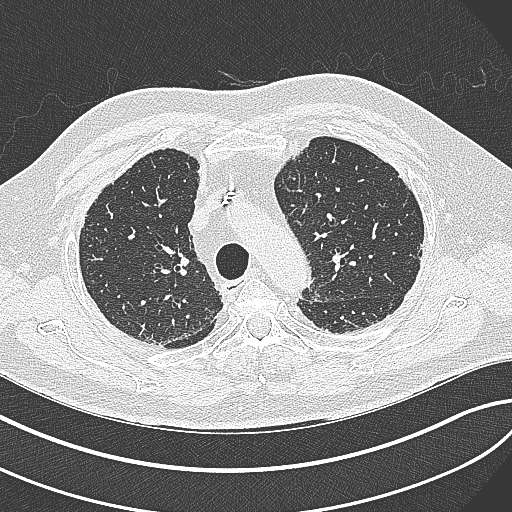

[Series 3: expiration · axial · 0.72mm/px · z∈[-242,-62]mm · 4 of 22 slices shown]
[im 5/22  lung]
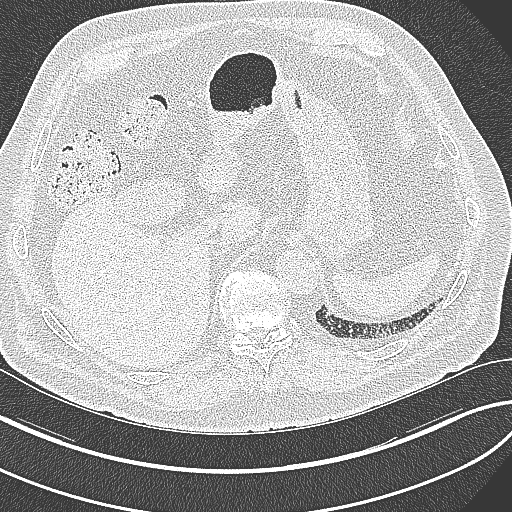
[im 9/22  lung]
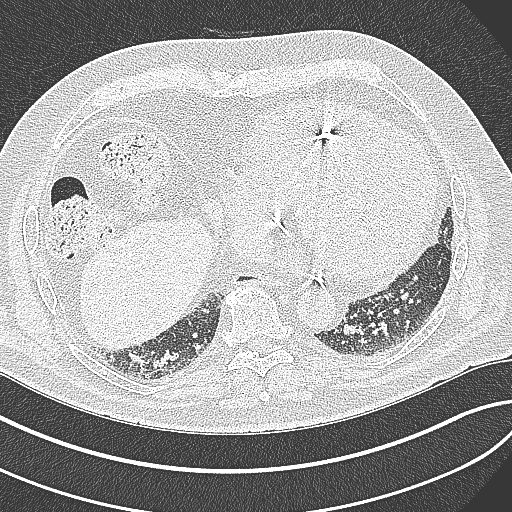
[im 13/22  lung]
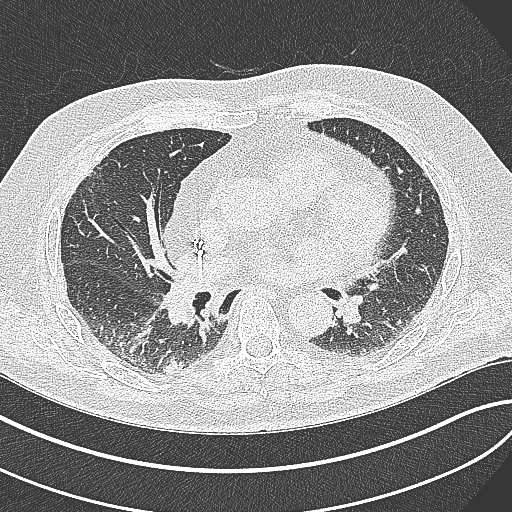
[im 17/22  lung]
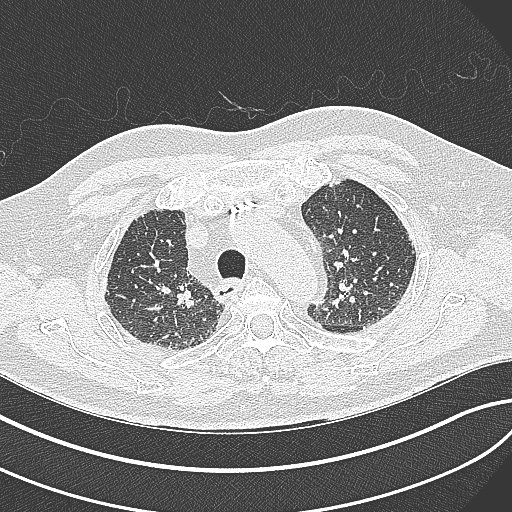

[Series 4: thorax · axial · 0.72mm/px · z∈[-272,-137]mm · 5 of 59 slices shown]
[im 4/59  lung]
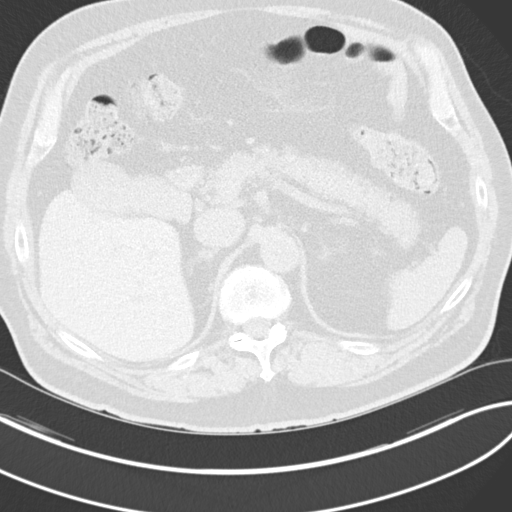
[im 12/59  lung]
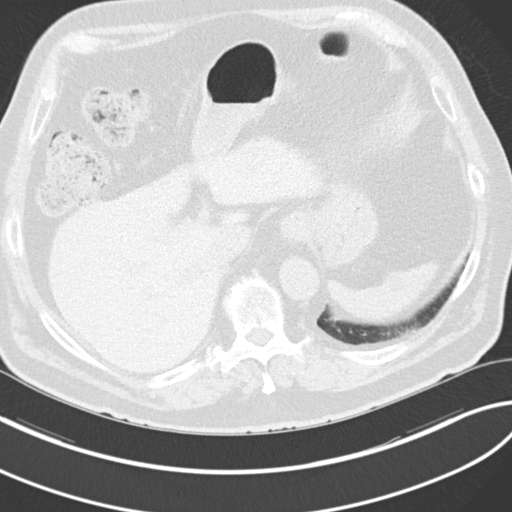
[im 20/59  lung]
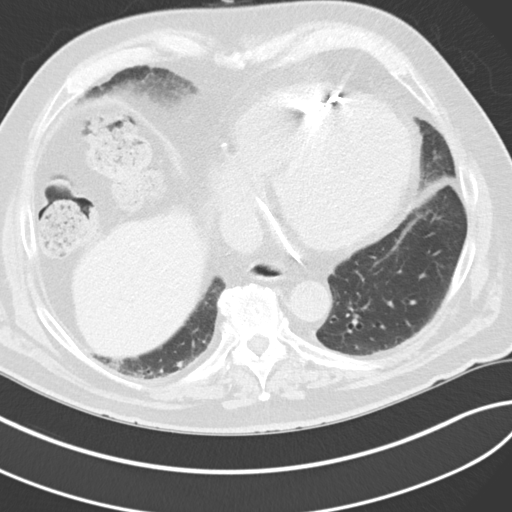
[im 28/59  lung]
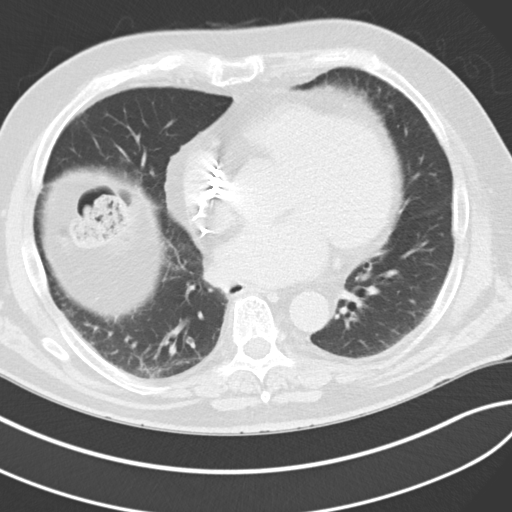
[im 31/59  lung]
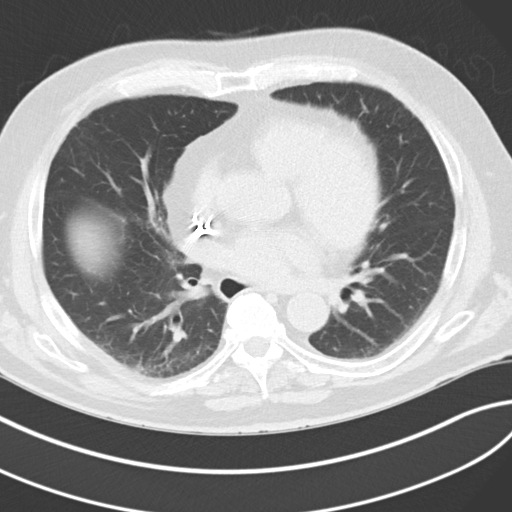

[Series 8: coronal · coronal · 0.63mm/px · 3 of 114 slices shown]
[im 23/114  lung]
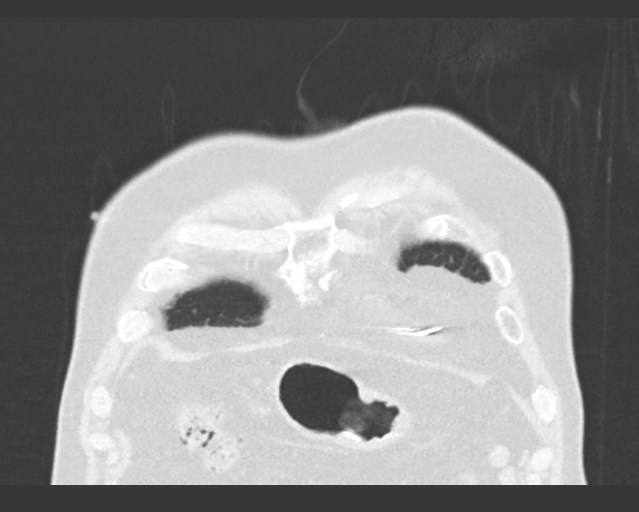
[im 46/114  lung]
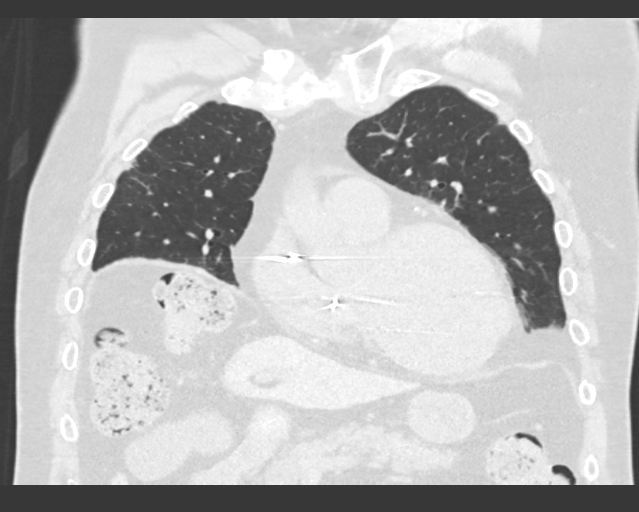
[im 68/114  lung]
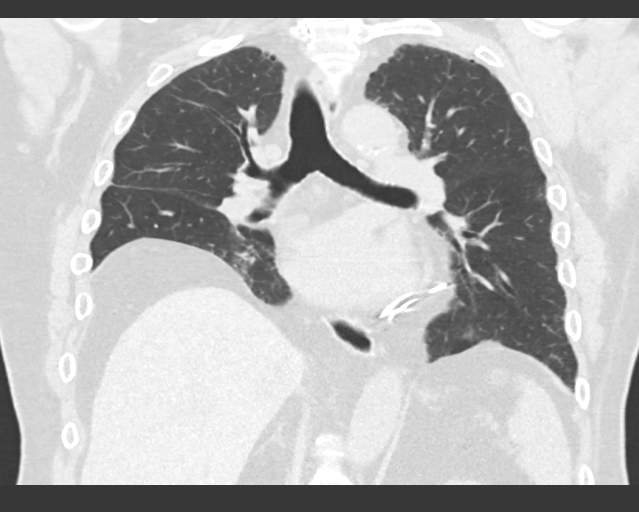

[16 of 36 positions shown; findings below may reference images not displayed]

FINDINGS: Mediastinum/Nodes: Stable mild cardiomegaly. No pericardial
fluid/thickening. Stable configuration of 3 lead left subclavian ICD
with lead tips in the right atrium, right ventricular apex and
coronary sinus. Left anterior descending and right coronary
atherosclerosis. Great vessels are normal in course and caliber.
Normal visualized thyroid. Normal esophagus. No pathologically
enlarged axillary, mediastinal or gross hilar lymph nodes, noting
limited sensitivity for the detection of hilar adenopathy on this
noncontrast study.

Lungs/Pleura: No pneumothorax. No pleural effusion. There is a
subpleural 10 x 7 mm superior segment right lower lobe pulmonary
nodule (series 5/ image 21), which was obscured by opacity on prior
chest CT. There is a 4 mm anterior left upper lobe solid pulmonary
nodule (series 5/ image 25), stable since 09/29/2013. There is an 8
x 7 mm left lower lobe pulmonary nodule (series 5/ image 35),
previously 7 x 6 mm on 09/29/2013, mildly increased. No acute
consolidative airspace disease or lung masses. Mild centrilobular
and paraseptal emphysema with mild diffuse bronchial wall
thickening. There is patchy subpleural reticulation throughout both
lungs with mild traction bronchiectasis, with a mild basilar
predominance. There is possible mild early honeycombing in the
posterior right lower lobe (series 2/image 7) . These findings may
have increased since 09/29/2013, although comparison is limited by
hypoventilatory change on the prior chest CT. No significant air
trapping on the expiration sequence.

Upper abdomen: Simple 1.8 cm left liver lobe cyst. Subcentimeter
gallstones layering in the gallbladder with no gallbladder wall
thickening or pericholecystic fluid. Stable mild elevation of the
right hemidiaphragm.

Musculoskeletal: No aggressive appearing focal osseous lesions.
Mild-to-moderate degenerative changes in the thoracic spine.
IMPRESSION: 1. Interstitial lung disease characterized by patchy subpleural
reticulation and mild traction bronchiectasis throughout both lungs
with a mild basilar predominance. Possible early honeycombing in the
right lower lobe. Possible progression since 09/29/2013, see
comments. Findings are worrisome for usual interstitial pneumonia
(UIP), although fibrotic nonspecific interstitial pneumonia (NSIP)
is on the differential. Follow-up high-resolution chest CT in 6-12
months is recommended to assess for temporal pattern stability.
2. Posterior right lower lobe 10 x 7 mm pulmonary nodule, obscured
by opacity on prior chest CT. Mild growth of 8 x 7 mm left lower
lobe pulmonary nodule. PET-CT versus follow-up chest CT in 3 months
is recommended.
3. No thoracic lymphadenopathy.
4. Additional findings include coronary atherosclerosis, mild
emphysema and cholelithiasis.

## 2017-08-10 ENCOUNTER — Other Ambulatory Visit: Payer: Self-pay | Admitting: Cardiology

## 2017-08-10 DIAGNOSIS — I059 Rheumatic mitral valve disease, unspecified: Secondary | ICD-10-CM

## 2017-08-10 DIAGNOSIS — I429 Cardiomyopathy, unspecified: Secondary | ICD-10-CM

## 2017-08-10 DIAGNOSIS — Z95810 Presence of automatic (implantable) cardiac defibrillator: Secondary | ICD-10-CM

## 2017-08-10 DIAGNOSIS — I480 Paroxysmal atrial fibrillation: Secondary | ICD-10-CM

## 2017-08-18 ENCOUNTER — Other Ambulatory Visit: Payer: Self-pay | Admitting: Cardiology

## 2017-08-19 ENCOUNTER — Other Ambulatory Visit: Payer: Self-pay | Admitting: Emergency Medicine

## 2017-08-21 ENCOUNTER — Other Ambulatory Visit: Payer: Self-pay | Admitting: Cardiology

## 2017-08-21 DIAGNOSIS — I5022 Chronic systolic (congestive) heart failure: Secondary | ICD-10-CM

## 2017-08-26 ENCOUNTER — Telehealth: Payer: Self-pay | Admitting: Emergency Medicine

## 2017-08-26 DIAGNOSIS — T148XXA Other injury of unspecified body region, initial encounter: Secondary | ICD-10-CM

## 2017-08-26 NOTE — Telephone Encounter (Signed)
Patient walked in and front desk had patient fill out triage form. Patient states that he cut his right lower arm while cutting down a tree on Thursday 08/21/2017. Patient states that he has been washing his arm 2 times per day with mild soap and water, apply neosporin and A& D Ointment. After Assessing the patients arm, arm did not look infected, I apply more Neosporin, applied Nonadhering Pads and Wrapped with Coban. Advised Patient to watch for fever, swelling, foul odor and discharge. Patient verbalized understanding.   Doloris Hall,  LPN

## 2017-08-27 ENCOUNTER — Other Ambulatory Visit: Payer: Self-pay

## 2017-08-27 ENCOUNTER — Ambulatory Visit: Payer: Medicare Other | Admitting: Emergency Medicine

## 2017-08-27 DIAGNOSIS — T148XXA Other injury of unspecified body region, initial encounter: Secondary | ICD-10-CM

## 2017-08-27 MED ORDER — TETANUS-DIPHTHERIA TOXOIDS TD 5-2 LFU IM INJ
0.5000 mL | INJECTION | Freq: Once | INTRAMUSCULAR | Status: AC
  Administered 2017-08-27: 0.5 mL via INTRAMUSCULAR

## 2017-08-27 NOTE — Telephone Encounter (Signed)
Please call pt as he needs a Td given his recent injury

## 2017-08-27 NOTE — Progress Notes (Signed)
td

## 2017-08-27 NOTE — Telephone Encounter (Signed)
Patient schedule for nurse visit today for Td.

## 2017-08-27 NOTE — Progress Notes (Signed)
After obtaining consent, and per orders of Dr. [name], injection of Tetanus given by Sigurd Sos.   Doloris Hall,  LPN

## 2017-09-09 ENCOUNTER — Telehealth: Payer: Self-pay | Admitting: Emergency Medicine

## 2017-09-09 NOTE — Telephone Encounter (Signed)
PA started on CMM: KEY: [id number]  In process-up to 72 hours for decision. Will route to [place] for follow up.

## 2017-09-11 NOTE — Telephone Encounter (Signed)
Checked Cover My Meds. PA was canceled because it was no longer needed.

## 2017-09-20 DIAGNOSIS — Z23 Encounter for immunization: Secondary | ICD-10-CM | POA: Diagnosis not present

## 2017-09-30 ENCOUNTER — Other Ambulatory Visit: Payer: Self-pay | Admitting: Emergency Medicine

## 2017-10-01 ENCOUNTER — Ambulatory Visit (INDEPENDENT_AMBULATORY_CARE_PROVIDER_SITE_OTHER): Payer: Medicare Other | Admitting: *Deleted

## 2017-10-01 DIAGNOSIS — I472 Ventricular tachycardia: Secondary | ICD-10-CM | POA: Diagnosis not present

## 2017-10-01 DIAGNOSIS — I4729 Other ventricular tachycardia: Secondary | ICD-10-CM

## 2017-10-01 NOTE — Progress Notes (Signed)
Remote ICD transmission.

## 2017-10-03 DIAGNOSIS — L821 Other seborrheic keratosis: Secondary | ICD-10-CM | POA: Diagnosis not present

## 2017-10-03 DIAGNOSIS — D692 Other nonthrombocytopenic purpura: Secondary | ICD-10-CM | POA: Diagnosis not present

## 2017-10-03 DIAGNOSIS — D1801 Hemangioma of skin and subcutaneous tissue: Secondary | ICD-10-CM | POA: Diagnosis not present

## 2017-10-03 DIAGNOSIS — L570 Actinic keratosis: Secondary | ICD-10-CM | POA: Diagnosis not present

## 2017-10-06 ENCOUNTER — Telehealth: Payer: Self-pay | Admitting: Cardiology

## 2017-10-06 ENCOUNTER — Other Ambulatory Visit: Payer: Self-pay

## 2017-10-06 ENCOUNTER — Ambulatory Visit (INDEPENDENT_AMBULATORY_CARE_PROVIDER_SITE_OTHER): Payer: Medicare Other | Admitting: Family Medicine

## 2017-10-06 ENCOUNTER — Ambulatory Visit: Payer: Self-pay

## 2017-10-06 ENCOUNTER — Encounter: Payer: Self-pay | Admitting: Family Medicine

## 2017-10-06 VITALS — BP 102/62 | HR 75 | Temp 97.8°F | Ht 70.0 in | Wt 216.8 lb

## 2017-10-06 DIAGNOSIS — R0789 Other chest pain: Secondary | ICD-10-CM | POA: Diagnosis not present

## 2017-10-06 DIAGNOSIS — W19XXXA Unspecified fall, initial encounter: Secondary | ICD-10-CM | POA: Diagnosis not present

## 2017-10-06 DIAGNOSIS — S80811A Abrasion, right lower leg, initial encounter: Secondary | ICD-10-CM | POA: Diagnosis not present

## 2017-10-06 DIAGNOSIS — Z95810 Presence of automatic (implantable) cardiac defibrillator: Secondary | ICD-10-CM | POA: Diagnosis not present

## 2017-10-06 DIAGNOSIS — L089 Local infection of the skin and subcutaneous tissue, unspecified: Secondary | ICD-10-CM | POA: Diagnosis not present
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
## 2017-10-06 NOTE — Patient Instructions (Signed)
Please follow up if symptoms do not improve or as needed.   Everything looks good!
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
## 2017-10-07 ENCOUNTER — Ambulatory Visit: Payer: Medicare Other | Admitting: Family Medicine

## 2017-10-13 NOTE — Assessment & Plan Note (Signed)
Chronic problem.  Following w/ Dr Stanford Breed.  Currently asymptomatic.  Will continue to follow along.

## 2017-10-13 NOTE — Assessment & Plan Note (Signed)
Check labs and replete prn.

## 2017-10-13 NOTE — Assessment & Plan Note (Signed)
Chronic problem.  Following w/ Dr Lamonte Sakai.  He feels that his lungs have improved since stopping Amiodarone.  I reassured him that there is no evidence of thrush today which made him feel better.

## 2017-10-13 NOTE — Assessment & Plan Note (Signed)
Chronic problem.  Has been following w/ Dr Caryl Comes  And was on Amiodarone until mid April when he stopped it due to pulmonary concerns.  He knows that Dr Caryl Comes wants him to remain on Amiodarone but he is very hesitant about this.  Would like a 2nd opinion to determine what the next best steps are.  Referral placed.

## 2017-10-13 NOTE — Assessment & Plan Note (Signed)
Chronic problem.  On Zetia w/o difficulty.  Check labs.  Adjust meds prn

## 2017-10-14 ENCOUNTER — Other Ambulatory Visit: Payer: Self-pay | Admitting: Cardiology

## 2017-10-28 LAB — CUP PACEART REMOTE DEVICE CHECK
Implantable Lead Implant Date: 20020527
Implantable Lead Implant Date: 20131212
Implantable Lead Location: 753858
Implantable Lead Model: 148
Implantable Lead Model: 4194
Implantable Lead Model: 6940
Implantable Lead Serial Number: 120244
Implantable Pulse Generator Implant Date: 20111207
MDC IDC LEAD IMPLANT DT: 20020527
MDC IDC LEAD LOCATION: 753859
MDC IDC LEAD LOCATION: 753860
MDC IDC PG SERIAL: 492371
MDC IDC SESS DTM: 20191015094028

## 2017-10-28 NOTE — Progress Notes (Signed)
HPI: FU history of coronary artery disease, mixed ischemic and nonischemic cardiomyopathy, history of ICD, paroxysmal atrial fibrillation, and ventricular tachycardia. Previous abdominal ultrasound in April 2006 showed no aneurysm. Last cardiac catheterization in October 2008 showed an occluded right coronary artery with left-to-right collateralization. Ejection fraction is 20%. There was no other coronary disease noted. Last Myoview in Dec 2011 showed prior inferior infarct but no ischemia. His ejection fraction was 19%. ABIs in December of 2011 were normal. Previous carotid Dopplers in Sept 2014 showed 0-39% stenosis bilaterally.The patient has had upgrade of his device to CRT-D. Patient had revision of his left ventricular lead in December 2013. Echocardiogram January 2017 showed ejection fraction 97-41%, grade 1 diastolic dysfunction, mild mitral regurgitation, severe left atrial enlargement mild right sided enlargement and mild tricuspid regurgitation. Amiodarone discontinued March 2019 because of potential pulmonary toxicity.  Since last seen,  patient denies dyspnea, chest pain, palpitations or syncope.  No pedal edema.  Current Outpatient Medications  Medication Sig Dispense Refill  . antiseptic oral rinse (BIOTENE) LIQD 15 mLs by Mouth Rinse route as needed for dry mouth (TID).    [name] carvedilol (COREG) 12.5 MG tablet TAKE 1 TABLET BY MOUTH TWICE DAILY WITH A MEAL 180 tablet 0  . cholecalciferol (VITAMIN D) 1000 units tablet Take 1,000 Units by mouth daily.    [name] ENTRESTO 24-26 MG TAKE 1 TABLET BY MOUTH TWICE DAILY 180 tablet 0  . eplerenone (INSPRA) 25 MG tablet Take 1 tablet (25 mg total) by mouth daily. 30 tablet 11  . ezetimibe (ZETIA) 10 MG tablet TAKE 1 TABLET(10 MG) BY MOUTH DAILY 30 tablet 6  . FLUAD 0.5 ML SUSY ADM 0.5ML IM UTD  0  . fluticasone (FLONASE) 50 MCG/ACT nasal spray SHAKE LIQUID AND USE 2 SPRAYS IN EACH NOSTRIL DAILY 16 g 5  . furosemide (LASIX) 40 MG tablet TAKE 2  TABLETS(80 MG) BY MOUTH DAILY 180 tablet 1  . ipratropium (ATROVENT) 0.06 % nasal spray USE 2 SPRAYS IN EACH NOSTRIL THREE TIMES DAILY 15 mL 2  . loratadine (CLARITIN) 10 MG tablet Take 10 mg by mouth daily.    . Magnesium 250 MG TABS Take 1 tablet by mouth daily.    [name] mexiletine (MEXITIL) 150 MG capsule TAKE 2 CAPSULES(300 MG) BY MOUTH TWICE DAILY 360 capsule 0  . Multiple Vitamin (MULTIVITAMIN WITH MINERALS) TABS Take 1 tablet by mouth daily.    . nitroGLYCERIN (NITROSTAT) 0.4 MG SL tablet DISSOLVE 1 TABLET UNDER THE TONGUE EVERY 5 MINUTES AS NEEDED FOR CHEST PAIN 90 tablet 1  . omeprazole (PRILOSEC) 20 MG capsule Take 20 mg by mouth daily.    . polyethylene glycol (MIRALAX / GLYCOLAX) packet Take 17 g by mouth daily.    . polyvinyl alcohol-povidone (REFRESH) 1.4-0.6 % ophthalmic solution Place 1-2 drops into both eyes as needed (dry eyes).     . potassium chloride SA (K-DUR,KLOR-CON) 20 MEQ tablet TAKE 1 TABLET(20 MEQ) BY MOUTH DAILY 90 tablet 0  . Probiotic Product (PROBIOTIC DAILY PO) Take 1 tablet by mouth daily.    . SYMBICORT 160-4.5 MCG/ACT inhaler INHALE 2 PUFFS INTO THE LUNGS TWICE DAILY 10.2 g 5  . VENTOLIN HFA 108 (90 Base) MCG/ACT inhaler INHALE 1 TO 2 PUFFS INTO THE LUNGS EVERY 4 HOURS AS NEEDED FOR WHEEZING OR SHORTNESS OF BREATH 18 g 5  . Wheat Dextrin (BENEFIBER PO) Take 2 Scoops by mouth 2 (two) times daily.    Alveda Reasons 20 MG TABS  tablet TAKE 1 TABLET BY MOUTH EVERY DAY WITH DINNER 90 tablet 1   No current facility-administered medications for this visit.      Past Medical History:  Diagnosis Date  . Adenomatous colon polyp 2009; 06/2012   Colonoscopy 07/2007---Dr. Barron Schmid [facility]--Repeat 06/2012 showed one tubular adenoma w/out high grade dysplasia.  [name] AICD (automatic cardioverter/defibrillator) present    [employer]  . Atrial fibrillation ([place])   . Bilateral sensorineural hearing loss   . BPH (benign prostatic hypertrophy)    PSAs ok (followed by urologist): no  LUTS  . CAD (coronary artery disease)    PTCA RCA 1997 (Inf/post MI);  . CHF (congestive heart failure) ([place])   . Chronic constipation   . Chronic rhinitis    ?vasomotor (Dr. Erik Obey)  . COPD (chronic obstructive pulmonary disease) ([place])   . Diverticulosis of colon 2005   Noted on Colonoscopies in 2005 and 2009 and on CT 2010  . Gallstones 06/2014   No evidence of cholecystitis on abd u/s 06/2014  . GERD (gastroesophageal reflux disease)   . Hepatic steatosis 2010; 2016   Noted on CT abd 2010 and on u/s abd 04/2009 (transaminasemia) and u/s 06/2014.  [name] Hyperlipidemia   . Hypertension   . Ischemic cardiomyopathy   . Microscopic hematuria    Cysto neg 02/2009 except for BPH-likely has microhem from his renal stone dz  . Nephrolithiasis    No distal stones on CT 2010; stable 8 mm RLP stone, 107m LMP stone 10/2013 plain film.  Abd u/s 06/2014: 9 mm nonobstructing lower pole right renal stone.  03/2015 urol: stable nonobstructing renal calc by CT 12/2014 and KUB 03/2015.  .Marland KitchenObstructive sleep apnea    no cpap uses since weight loss of 50 pounds  . Presence of permanent cardiac pacemaker    [employer] . Squamous cell carcinoma of back   . Ventricular tachycardia ([facility]     Past Surgical History:  Procedure Laterality Date  . ATRIAL ABLATION SURGERY     flutter ablation 2002  . BREAST SURGERY Left 1990   "Tissue removed from breast"  . CARDIAC CATHETERIZATION    . carotid doppler  09/25/12   No signif stenosis  . COLONOSCOPY N/A 07/13/2012   Diverticula ascending colon.  Polyp x 1-recall 5 yrs (Dr. MWatt Climes Procedure: COLONOSCOPY;  Surgeon: MJeryl Columbia MD;  Location: [facility];  Service: Endoscopy;  Laterality: N/A; polypectomy x 1   . COLONOSCOPY WITH PROPOFOL N/A 08/18/2015   Procedure: COLONOSCOPY WITH PROPOFOL;  Surgeon: KMauri Pole MD;  Location: [facility];  Service: Endoscopy;  Laterality: N/A;  . ESOPHAGOGASTRODUODENOSCOPY N/A 07/13/2012   Procedure:  ESOPHAGOGASTRODUODENOSCOPY (EGD);  Surgeon: MJeryl Columbia MD;  [facility];  Service: Endoscopy;  Laterality: N/A;  NORMAL  . EXTRACORPOREAL SHOCK WAVE LITHOTRIPSY    . HEMORRHOID BANDING    . ICD     a) Guidant Contak H170- SVirl Axe MD 07/25/06 (second device) b) Medtronic CRT-D  . INGUINAL HERNIA REPAIR Bilateral 1975  . LEAD REVISION Bilateral 12/26/2011   Procedure: LEAD REVISION;  Surgeon: GEvans Lance MD;  [facility];  Service: Cardiovascular;  Laterality: Bilateral;  . PFTs  02/2015   Mod restriction (interstitial), mod diffusion defect (Dr. YAnnamaria Boots  . precancer area keratsis removed  05/2015    leftf [place] and left arm and right arm and back    Social History   Socioeconomic History  . Marital status: Married  Spouse name: Not on file  . Number of children: 3  . Years of education: Not on file  . Highest education level: Not on file  Occupational History  . Occupation: Retired    Fish farm manager: RETIRED  Social Needs  . Financial resource strain: Not on file  . Food insecurity:    Worry: Not on file    Inability: Not on file  . Transportation needs:    Medical: Not on file    Non-medical: Not on file  Tobacco Use  . Smoking status: Former Smoker    Packs/day: 2.00    Years: 15.00    Pack years: 30.00    Types: Cigarettes    Last attempt to quit: 01/15/1980    Years since quitting: 37.8  . Smokeless tobacco: Never Used  Substance and Sexual Activity  . Alcohol use: No    Alcohol/week: 0.0 standard drinks  . Drug use: No  . Sexual activity: Not on file  Lifestyle  . Physical activity:    Days per week: Not on file    Minutes per session: Not on file  . Stress: Not on file  Relationships  . Social connections:    Talks on phone: Not on file    Gets together: Not on file    Attends religious service: Not on file    Active member of club or organization: Not on file    Attends meetings of clubs or organizations: Not on file     Relationship status: Not on file  . Intimate partner violence:    Fear of current or ex partner: Not on file    Emotionally abused: Not on file    Physically abused: Not on file    Forced sexual activity: Not on file  Other Topics Concern  . Not on file  Social History Narrative   Married.   Hx of cigarettes: quit in the 1980s.  No alc/drugs.    Family History  Problem Relation Age of Onset  . Heart disease Mother   . Other Mother        colon surgery for fistula  . Gallstones Mother   . Breast cancer Maternal Grandmother   . Coronary artery disease Father        family hx of  . Heart attack Father        4 stents/ pacemaker  . Nephrolithiasis Father   . Hypertension Sister   . Goiter Paternal Grandmother   . Nephrolithiasis Son   . Colon cancer Neg Hx     ROS: no fevers or chills, productive cough, hemoptysis, dysphasia, odynophagia, melena, hematochezia, dysuria, hematuria, rash, seizure activity, orthopnea, PND, pedal edema, claudication. Remaining systems are negative.  Physical Exam: Well-developed well-nourished in no acute distress.  Skin is warm and dry.  HEENT is normal.  Neck is supple.  Chest is clear to auscultation with normal expansion.  Cardiovascular exam is regular rate and rhythm.  Abdominal exam nontender or distended. No masses palpated. Extremities show no edema. neuro grossly intact  ECG-probable sinus with ventricular pacing.  Personally reviewed  A/P  1 coronary artery disease-no ASA given need for anticoagulation.  Statin discontinued previously because of increased liver functions.  However he was on amiodarone at the time as well and most recent liver functions normal.  I will try Crestor 10 mg daily.  2 ischemic cardiomyopathy-continue Entresto and carvedilol.  3 chronic systolic congestive heart failure-patient is euvolemic on examination today.  Continue present dose of diuretic.  4 paroxysmal atrial fibrillation-patient remains in  sinus rhythm.  Continue Xarelto.  Continue beta-blocker.  5 hyperlipidemia-continue Zetia.  Statins discontinued previously because of increased liver functions.  However most recent liver functions normal and he was previously on amiodarone.  I will try Crestor 10 mg daily.  Check lipids and liver in 4 weeks.  6 ventricular tachycardia-continue carvedilol, mexiletine; amiodarone discontinued previously because of potential lung toxicity.  However this was not definite.  If he has more ventricular arrhythmias in the future we can consider resuming or trying a different medication.  7 prior biventricular ICD-follow-up Dr. Caryl Comes.  8 hypertension-blood pressure is controlled.  Continue present medications.  Kirk Ruths, MD

## 2017-11-05 ENCOUNTER — Ambulatory Visit (INDEPENDENT_AMBULATORY_CARE_PROVIDER_SITE_OTHER): Payer: Medicare Other | Admitting: Cardiology

## 2017-11-05 ENCOUNTER — Encounter: Payer: Self-pay | Admitting: Cardiology

## 2017-11-05 VITALS — BP 110/66 | HR 62 | Ht 70.0 in | Wt 217.4 lb

## 2017-11-05 DIAGNOSIS — I10 Essential (primary) hypertension: Secondary | ICD-10-CM | POA: Diagnosis not present

## 2017-11-05 DIAGNOSIS — I2510 Atherosclerotic heart disease of native coronary artery without angina pectoris: Secondary | ICD-10-CM | POA: Diagnosis not present

## 2017-11-05 DIAGNOSIS — I429 Cardiomyopathy, unspecified: Secondary | ICD-10-CM | POA: Diagnosis not present

## 2017-11-05 DIAGNOSIS — E7800 Pure hypercholesterolemia, unspecified: Secondary | ICD-10-CM

## 2017-11-05 MED ORDER — ROSUVASTATIN CALCIUM 10 MG PO TABS: 10.0000 mg | ORAL_TABLET | Freq: Every day | ORAL | 3 refills | Status: DC

## 2017-11-05 NOTE — Patient Instructions (Signed)
Medication Instructions:   START ROSUVASTATIN 10 MG ONCE DAILY  Labwork:  Your physician recommends that you return for lab work in: [place]:  Your physician recommends that you schedule a follow-up appointment in: 6 MONTHS WITH DR CRENSHAW=PLEASE GIVE OUR OFFICE A CALL 2 MONTHS PRIOR TO THAT APPOINTMENT TIME TO SCHEDULE

## 2017-11-14 ENCOUNTER — Other Ambulatory Visit: Payer: Self-pay | Admitting: Cardiology

## 2017-11-14 DIAGNOSIS — I5022 Chronic systolic (congestive) heart failure: Secondary | ICD-10-CM

## 2017-11-15 ENCOUNTER — Other Ambulatory Visit: Payer: Self-pay | Admitting: Emergency Medicine

## 2017-11-18 ENCOUNTER — Other Ambulatory Visit: Payer: Self-pay | Admitting: Cardiology

## 2017-11-18 NOTE — Telephone Encounter (Signed)
Rx request sent to pharmacy.  

## 2017-11-27 ENCOUNTER — Other Ambulatory Visit: Payer: Self-pay | Admitting: Emergency Medicine

## 2017-12-10 ENCOUNTER — Other Ambulatory Visit: Payer: Medicare Other

## 2017-12-14 ENCOUNTER — Other Ambulatory Visit: Payer: Self-pay | Admitting: Cardiology

## 2017-12-16 NOTE — Telephone Encounter (Signed)
Rx sent to pharmacy   

## 2017-12-17 ENCOUNTER — Telehealth: Payer: Self-pay | Admitting: *Deleted

## 2017-12-17 NOTE — Telephone Encounter (Signed)
Patient sent a note to dr Stanford Breed reporting he developed a rash after starting the crestor. He has stopped the medication and will restart once rash has cleared to see if that is the cause. He will let us know.

## 2017-12-19 ENCOUNTER — Other Ambulatory Visit: Payer: Self-pay | Admitting: Emergency Medicine

## 2017-12-25 DIAGNOSIS — H1133 Conjunctival hemorrhage, bilateral: Secondary | ICD-10-CM | POA: Diagnosis not present

## 2017-12-31 ENCOUNTER — Ambulatory Visit (INDEPENDENT_AMBULATORY_CARE_PROVIDER_SITE_OTHER): Payer: Medicare Other

## 2017-12-31 ENCOUNTER — Ambulatory Visit: Payer: Medicare Other | Admitting: Physician Assistant

## 2017-12-31 DIAGNOSIS — I429 Cardiomyopathy, unspecified: Secondary | ICD-10-CM | POA: Diagnosis not present

## 2017-12-31 NOTE — Progress Notes (Signed)
Remote ICD transmission.   

## 2018-01-01 ENCOUNTER — Encounter: Payer: Self-pay | Admitting: Cardiology

## 2018-01-08 ENCOUNTER — Other Ambulatory Visit: Payer: Self-pay | Admitting: Emergency Medicine

## 2018-01-22 ENCOUNTER — Ambulatory Visit (INDEPENDENT_AMBULATORY_CARE_PROVIDER_SITE_OTHER): Payer: Medicare Other | Admitting: Internal Medicine

## 2018-01-22 ENCOUNTER — Encounter: Payer: Self-pay | Admitting: Internal Medicine

## 2018-01-22 VITALS — BP 110/74 | HR 60 | Ht 70.0 in | Wt 223.0 lb

## 2018-01-22 DIAGNOSIS — I255 Ischemic cardiomyopathy: Secondary | ICD-10-CM

## 2018-01-22 DIAGNOSIS — Z95810 Presence of automatic (implantable) cardiac defibrillator: Secondary | ICD-10-CM | POA: Diagnosis not present

## 2018-01-22 DIAGNOSIS — I5022 Chronic systolic (congestive) heart failure: Secondary | ICD-10-CM

## 2018-01-22 NOTE — Progress Notes (Signed)
Patient Care Team: [name], MD as PCP - General (Family Medicine) Festus Aloe, MD as Consulting Physician (Urology) Michael Boston, MD as Consulting Physician (General Surgery) Stanford Breed Denice Bors, MD as Consulting Physician (Cardiology) Jodi Marble, MD as Consulting Physician (Otolaryngology) Deboraha Sprang, MD as Consulting Physician (Cardiology) Luberta Mutter, MD as Consulting Physician (Ophthalmology) Lamonte Sakai Rose Fillers, MD as Consulting Physician (Pulmonary Disease) Mauri Pole, MD as Consulting Physician (Gastroenterology)   HPI  [name] is a 78 y.o. male Seen in followup for congestive heart failure in the setting of ischemic heart disease and previously implanted CRT-D with subsequent left ventricular lead macro dislodgment. His symptoms however been quite stable. When he saw Dr. Lovena Le concerning lead extraction and lead implantation he was doing as well as he had for some time and it was decided not to pursue it then. Ultimately this was done  2013  He also has ventricular tachycardia for which he takes amiodarone.   required adjunctive mexiletine. LFTs were abnormal and his Crestor was stopped.  Subsequently normalized even on amiodarone.  Was started on Zetia 5/18.  He also has atrial fibrillation and is taking Xarelto without bleeding   He has been well managed with guideline directed therapy currently on Entresto eplernonoe and carvedilol.   Feels better than he has in years    Pulm notes and CT report reviewed   Repeat testing anticipated 3/19.   CT 3/18 demonstrated emphysema with chronic interstitial changes but no progressive findings PFTs and DLCO prompted the discontinuation of amiodarone  No interval ICD discharges; no palpitations.   Dyspnea remains an issue but has not changed.  No chest pain.  No edema.  DATE TEST    10/08 Cath   EF 20 % T RCA  12/13 Cath EF<20% T RCA   1/17 Echo   EF 20-25 %             Date  Cr K Hgb TSH PLCO LFTs LDL  2008     80%    11/17     1.91 56%(3/17) 31    6/18 0.95 4.0 15.2 2.19  30 162  5/19 0.95 4.0 2.24 27              He has been intolerant of statins.        Past Medical History:  Diagnosis Date  . Adenomatous colon polyp 2009; 06/2012   Colonoscopy 07/2007---Dr. Barron Schmid GI--Repeat 06/2012 showed one tubular adenoma w/out high grade dysplasia.  [name] AICD (automatic cardioverter/defibrillator) present    [employer]  . Atrial fibrillation ([place])   . Bilateral sensorineural hearing loss   . BPH (benign prostatic hypertrophy)    PSAs ok (followed by urologist): no LUTS  . CAD (coronary artery disease)    PTCA RCA 1997 (Inf/post MI);  . CHF (congestive heart failure) ([place])   . Chronic constipation   . Chronic rhinitis    ?vasomotor (Dr. Erik Obey)  . COPD (chronic obstructive pulmonary disease) ([place])   . Diverticulosis of colon 2005   Noted on Colonoscopies in 2005 and 2009 and on CT 2010  . Gallstones 06/2014   No evidence of cholecystitis on abd u/s 06/2014  . GERD (gastroesophageal reflux disease)   . Hepatic steatosis 2010; 2016   Noted on CT abd 2010 and on u/s abd 04/2009 (transaminasemia) and u/s 06/2014.  [name] Hyperlipidemia   . Hypertension   . Ischemic cardiomyopathy   . Microscopic hematuria  Cysto neg 02/2009 except for BPH-likely has microhem from his renal stone dz  . Nephrolithiasis    No distal stones on CT 2010; stable 8 mm RLP stone, 14m LMP stone 10/2013 plain film.  Abd u/s 06/2014: 9 mm nonobstructing lower pole right renal stone.  03/2015 urol: stable nonobstructing renal calc by CT 12/2014 and KUB 03/2015.  .Marland KitchenObstructive sleep apnea    no cpap uses since weight loss of 50 pounds  . Presence of permanent cardiac pacemaker    [employer] . Squamous cell carcinoma of back   . Ventricular tachycardia ([facility]     Past Surgical History:  Procedure Laterality Date  . ATRIAL ABLATION SURGERY     flutter ablation 2002  .  BREAST SURGERY Left 1990   "Tissue removed from breast"  . CARDIAC CATHETERIZATION    . carotid doppler  09/25/12   No signif stenosis  . COLONOSCOPY N/A 07/13/2012   Diverticula ascending colon.  Polyp x 1-recall 5 yrs (Dr. MWatt Climes Procedure: COLONOSCOPY;  Surgeon: MJeryl Columbia MD;  Location: [facility];  Service: Endoscopy;  Laterality: N/A; polypectomy x 1   . COLONOSCOPY WITH PROPOFOL N/A 08/18/2015   Procedure: COLONOSCOPY WITH PROPOFOL;  Surgeon: KMauri Pole MD;  Location: [facility] ENDOSCOPY;  Service: Endoscopy;  Laterality: N/A;  . ESOPHAGOGASTRODUODENOSCOPY N/A 07/13/2012   Procedure: ESOPHAGOGASTRODUODENOSCOPY (EGD);  Surgeon: MJeryl Columbia MD;  [facility];  Service: Endoscopy;  Laterality: N/A;  NORMAL  . EXTRACORPOREAL SHOCK WAVE LITHOTRIPSY    . HEMORRHOID BANDING    . ICD     a) Guidant Contak H170- SVirl Axe MD 07/25/06 (second device) b) Medtronic CRT-D  . INGUINAL HERNIA REPAIR Bilateral 1975  . LEAD REVISION Bilateral 12/26/2011   Procedure: LEAD REVISION;  Surgeon: GEvans Lance MD;  [facility];  Service: Cardiovascular;  Laterality: Bilateral;  . PFTs  02/2015   Mod restriction (interstitial), mod diffusion defect (Dr. YAnnamaria Boots  . precancer area keratsis removed  05/2015    leftf orehead and left arm and right arm and back    Current Outpatient Medications  Medication Sig Dispense Refill  . antiseptic oral rinse (BIOTENE) LIQD 15 mLs by Mouth Rinse route as needed for dry mouth (TID).    .Marland Kitchencarvedilol (COREG) 12.5 MG tablet TAKE 1 TABLET BY MOUTH TWICE DAILY WITH A MEAL 180 tablet 0  . cholecalciferol (VITAMIN D) 1000 units tablet Take 1,000 Units by mouth daily.    .Marland KitchenENTRESTO 24-26 MG TAKE 1 TABLET BY MOUTH TWICE DAILY 180 tablet 0  . eplerenone (INSPRA) 25 MG tablet Take 1 tablet (25 mg total) by mouth daily. 30 tablet 11  . ezetimibe (ZETIA) 10 MG tablet TAKE 1 TABLET(10 MG) BY MOUTH DAILY 30 tablet 6  . fluticasone (FLONASE) 50 MCG/ACT  nasal spray SHAKE LIQUID AND USE 2 SPRAYS IN EACH NOSTRIL DAILY 16 g 2  . furosemide (LASIX) 40 MG tablet TAKE 2 TABLETS(80 MG) BY MOUTH DAILY 180 tablet 1  . ipratropium (ATROVENT) 0.06 % nasal spray USE 2 SPRAYS IN EACH NOSTRIL THREE TIMES DAILY 15 mL 2  . loratadine (CLARITIN) 10 MG tablet Take 10 mg by mouth daily.    . Magnesium 250 MG TABS Take 1 tablet by mouth daily.    .Marland Kitchenmexiletine (MEXITIL) 150 MG capsule TAKE 2 CAPSULES(300 MG) BY MOUTH TWICE DAILY 360 capsule 0  . Multiple Vitamin (MULTIVITAMIN WITH MINERALS) TABS Take 1 tablet by mouth daily.    .[name]  nitroGLYCERIN (NITROSTAT) 0.4 MG SL tablet DISSOLVE 1 TABLET UNDER THE TONGUE EVERY 5 MINUTES AS NEEDED FOR CHEST PAIN 90 tablet 1  . omeprazole (PRILOSEC) 20 MG capsule Take 20 mg by mouth daily.    . polyvinyl alcohol-povidone (REFRESH) 1.4-0.6 % ophthalmic solution Place 1-2 drops into both eyes as needed (dry eyes).     . potassium chloride SA (K-DUR,KLOR-CON) 20 MEQ tablet TAKE 1 TABLET(20 MEQ) BY MOUTH DAILY 90 tablet 0  . Probiotic Product (PROBIOTIC DAILY PO) Take 1 tablet by mouth daily.    . rosuvastatin (CRESTOR) 10 MG tablet Take 1 tablet (10 mg total) by mouth daily. 90 tablet 3  . SYMBICORT 160-4.5 MCG/ACT inhaler INHALE 2 PUFFS INTO THE LUNGS TWICE DAILY 10.2 g 5  . VENTOLIN HFA 108 (90 Base) MCG/ACT inhaler INHALE 1 TO 2 PUFFS INTO THE LUNGS EVERY 4 HOURS AS NEEDED FOR WHEEZING OR SHORTNESS OF BREATH 18 g 5  . Wheat Dextrin (BENEFIBER PO) Take 2 Scoops by mouth 2 (two) times daily.    Alveda Reasons 20 MG TABS tablet TAKE 1 TABLET BY MOUTH EVERY DAY WITH DINNER 90 tablet 1   No current facility-administered medications for this visit.     Allergies  Allergen Reactions  . Sulfa Antibiotics Rash  . Crestor [Rosuvastatin]     Review of Systems negative except from HPI and PMH  Physical Exam BP 110/74   Pulse 60   Ht _0  (1.778 m)   Wt 223 lb (101.2 kg)   SpO2 95%   BMI 32.00 kg/m  Well developed and nourished in  no acute distress HENT normal Neck supple with JVP-flat Clear Regular rate and rhythm, no murmurs or gallops Abd-soft with active BS No Clubbing cyanosis edema Skin-warm and dry A & Oriented  Grossly normal sensory and motor function  ECG demonstrates AV pacing with an upright QRS lead V1 and negative QRS lead I  Assessment and  Plan   Ventricular tachycardia  Ischemic and nonischemic cardiomyopathy  Congestive heart failure-chronic-systolic  CRT-D-St. Jude's  COPD pulm fibrosis  Atrial fibrillation  Question amiodarone lung toxicity  .Without symptoms of ischemia  No intercurrent Ventricular tachycardia Which has required therapy; there has been some increase in his nonsustained VT burden.  For now continue to withhold amiodarone.  I worry that the increase in VT-nonsustained is concurrent with the washing out off amiodarone.   His wife discussed that he would rather die of a cardiac issue than pulmonary toxicity.  I reminded him that there can also be shocks.  No intercurrent atrial fibrillation or flutter  On Anticoagulation;  No bleeding issues   Check blood work on his high risk medications, eplerenone, Xarelto  For now we will continue to withhold amiodarone.  We spent more than 50% of our >25 min visit in face to face counseling regarding the above

## 2018-01-22 NOTE — Patient Instructions (Signed)
Medication Instructions:  Your physician recommends that you continue on your current medications as directed. Please refer to the Current Medication list given to you today.  Labwork: You will have labs drawn today: CBC and BMP  Testing/Procedures: None ordered.  Follow-Up: Your physician recommends that you schedule a follow-up appointment in:   12 months with Dr Caryl Comes  Remote monitoring is used to monitor your ICD from home. This monitoring reduces the number of office visits required to check your device to one time per year. It allows [place] to keep an eye on the functioning of your device to ensure it is working properly. You are scheduled for a device check from home on 04/01/2018. You may send your transmission at any time that day. If you have a wireless device, the transmission will be sent automatically. After your physician reviews your transmission, you will receive a postcard with your next transmission date.    Any Other Special Instructions Will Be Listed Below (If Applicable).     If you need a refill on your cardiac medications before your next appointment, please call your pharmacy.

## 2018-01-23 LAB — CUP PACEART INCLINIC DEVICE CHECK
Date Time Interrogation Session: 20200109050000
HIGH POWER IMPEDANCE MEASURED VALUE: 51 Ohm
HighPow Impedance: 38 Ohm
Implantable Lead Implant Date: 20020527
Implantable Lead Implant Date: 20020527
Implantable Lead Implant Date: 20131212
Implantable Lead Location: 753858
Implantable Lead Location: 753859
Implantable Lead Location: 753860
Implantable Lead Model: 148
Implantable Lead Model: 4194
Implantable Lead Model: 6940
Implantable Lead Serial Number: 120244
Lead Channel Impedance Value: 565 Ohm
Lead Channel Impedance Value: 621 Ohm
Lead Channel Impedance Value: 935 Ohm
Lead Channel Pacing Threshold Amplitude: 1 V
Lead Channel Pacing Threshold Pulse Width: 0.3 ms
Lead Channel Pacing Threshold Pulse Width: 0.4 ms
Lead Channel Pacing Threshold Pulse Width: 1 ms
Lead Channel Sensing Intrinsic Amplitude: 4.4 mV
Lead Channel Setting Pacing Amplitude: 2 V
Lead Channel Setting Pacing Amplitude: 2.5 V
Lead Channel Setting Pacing Amplitude: 2.5 V
Lead Channel Setting Pacing Pulse Width: 0.4 ms
Lead Channel Setting Pacing Pulse Width: 1 ms
Lead Channel Setting Sensing Sensitivity: 0.5 mV
Lead Channel Setting Sensing Sensitivity: 1 mV
MDC IDC MSMT LEADCHNL LV PACING THRESHOLD AMPLITUDE: 1.9 V
MDC IDC MSMT LEADCHNL RA PACING THRESHOLD AMPLITUDE: 0.7 V
MDC IDC PG IMPLANT DT: 20111207
Pulse Gen Serial Number: 492371

## 2018-01-23 LAB — BASIC METABOLIC PANEL
BUN/Creatinine Ratio: 10 (ref 10–24)
BUN: 10 mg/dL (ref 8–27)
CO2: 26 mmol/L (ref 20–29)
Calcium: 9 mg/dL (ref 8.6–10.2)
Chloride: 104 mmol/L (ref 96–106)
Creatinine, Ser: 0.98 mg/dL (ref 0.76–1.27)
GFR calc Af Amer: 86 mL/min/{1.73_m2} (ref >59)
GFR calc non Af Amer: 75 mL/min/{1.73_m2} (ref >59)
Glucose: 85 mg/dL (ref 65–99)
Potassium: 4.4 mmol/L (ref 3.5–5.2)
Sodium: 143 mmol/L (ref 134–144)

## 2018-01-23 LAB — CBC
HEMATOCRIT: 43.6 % (ref 37.5–51.0)
Hemoglobin: 15 g/dL (ref 13.0–17.7)
MCH: 33.9 pg — ABNORMAL HIGH (ref 26.6–33.0)
MCHC: 34.4 g/dL (ref 31.5–35.7)
MCV: 98 fL — ABNORMAL HIGH (ref 79–97)
Platelets: 350 10*3/uL (ref 150–450)
RBC: 4.43 x10E6/uL (ref 4.14–5.80)
RDW: 12.6 % (ref 11.6–15.4)
WBC: 7.9 10*3/uL (ref 3.4–10.8)

## 2018-02-01 LAB — CUP PACEART REMOTE DEVICE CHECK
Battery Remaining Longevity: 30 mo
Battery Remaining Percentage: 44 %
Brady Statistic RA Percent Paced: 97 %
Date Time Interrogation Session: 20191218080100
HighPow Impedance: 52 Ohm
Implantable Lead Implant Date: 20020527
Implantable Lead Implant Date: 20020527
Implantable Lead Implant Date: 20131212
Implantable Lead Location: 753859
Implantable Lead Location: 753860
Implantable Lead Model: 4194
Implantable Lead Model: 6940
Implantable Lead Serial Number: 120244
Lead Channel Impedance Value: 587 Ohm
Lead Channel Impedance Value: 625 Ohm
Lead Channel Impedance Value: 970 Ohm
Lead Channel Pacing Threshold Amplitude: 1 V
Lead Channel Pacing Threshold Amplitude: 1.8 V
Lead Channel Pacing Threshold Pulse Width: 0.3 ms
Lead Channel Pacing Threshold Pulse Width: 0.4 ms
Lead Channel Pacing Threshold Pulse Width: 1 ms
Lead Channel Setting Pacing Amplitude: 2 V
Lead Channel Setting Pacing Amplitude: 2.5 V
Lead Channel Setting Pacing Amplitude: 2.5 V
Lead Channel Setting Pacing Pulse Width: 1 ms
Lead Channel Setting Sensing Sensitivity: 0.5 mV
Lead Channel Setting Sensing Sensitivity: 1 mV
MDC IDC LEAD LOCATION: 753858
MDC IDC MSMT LEADCHNL RA PACING THRESHOLD AMPLITUDE: 0.7 V
MDC IDC PG IMPLANT DT: 20111207
MDC IDC SET LEADCHNL RV PACING PULSEWIDTH: 0.4 ms
MDC IDC STAT BRADY RV PERCENT PACED: 100 %
Pulse Gen Serial Number: 492371

## 2018-02-04 ENCOUNTER — Other Ambulatory Visit: Payer: Self-pay | Admitting: Cardiology

## 2018-02-04 DIAGNOSIS — I059 Rheumatic mitral valve disease, unspecified: Secondary | ICD-10-CM

## 2018-02-04 DIAGNOSIS — I480 Paroxysmal atrial fibrillation: Secondary | ICD-10-CM

## 2018-02-04 DIAGNOSIS — I429 Cardiomyopathy, unspecified: Secondary | ICD-10-CM

## 2018-02-04 DIAGNOSIS — Z95810 Presence of automatic (implantable) cardiac defibrillator: Secondary | ICD-10-CM

## 2018-02-09 ENCOUNTER — Ambulatory Visit (INDEPENDENT_AMBULATORY_CARE_PROVIDER_SITE_OTHER): Payer: Medicare Other | Admitting: Emergency Medicine

## 2018-02-09 DIAGNOSIS — J849 Interstitial pulmonary disease, unspecified: Secondary | ICD-10-CM

## 2018-02-09 LAB — PULMONARY FUNCTION TEST
DL/VA % pred: 90 %
DL/VA: 4.2 ml/min/mmHg/L
DLCO COR: 17.9 ml/min/mmHg
DLCO cor % pred: 53 %
DLCO unc % pred: 53 %
DLCO unc: 18.1 ml/min/mmHg
FEF 25-75 Post: 2.44 L/sec
FEF 25-75 Pre: 2.73 L/sec
FEF2575-%Change-Post: -10 %
FEF2575-%Pred-Post: 107 %
FEF2575-%Pred-Pre: 120 %
FEV1-%Change-Post: -2 %
FEV1-%PRED-POST: 76 %
FEV1-%Pred-Pre: 78 %
FEV1-Post: 2.42 L
FEV1-Pre: 2.48 L
FEV1FVC-%Change-Post: 0 %
FEV1FVC-%Pred-Pre: 113 %
FEV6-%Change-Post: -1 %
FEV6-%PRED-PRE: 73 %
FEV6-%Pred-Post: 72 %
FEV6-Post: 2.97 L
FEV6-Pre: 3.02 L
FEV6FVC-%Change-Post: 0 %
FEV6FVC-%Pred-Post: 106 %
FEV6FVC-%Pred-Pre: 106 %
FVC-%Change-Post: -1 %
FVC-%Pred-Post: 67 %
FVC-%Pred-Pre: 69 %
FVC-Post: 2.97 L
FVC-Pre: 3.02 L
Post FEV1/FVC ratio: 82 %
Post FEV6/FVC ratio: 100 %
Pre FEV1/FVC ratio: 82 %
Pre FEV6/FVC Ratio: 100 %
RV % pred: 56 %
RV: 1.47 L
TLC % pred: 64 %
TLC: 4.65 L

## 2018-02-09 NOTE — Progress Notes (Signed)
Full PFT performed today.

## 2018-02-11 ENCOUNTER — Other Ambulatory Visit: Payer: Self-pay | Admitting: Cardiology

## 2018-02-11 DIAGNOSIS — I5022 Chronic systolic (congestive) heart failure: Secondary | ICD-10-CM

## 2018-02-11 NOTE — Telephone Encounter (Signed)
Rx(s) sent to pharmacy electronically.

## 2018-02-12 ENCOUNTER — Other Ambulatory Visit: Payer: Self-pay | Admitting: Family Medicine

## 2018-02-12 ENCOUNTER — Other Ambulatory Visit: Payer: Self-pay | Admitting: Cardiology

## 2018-02-12 DIAGNOSIS — I059 Rheumatic mitral valve disease, unspecified: Secondary | ICD-10-CM

## 2018-02-12 DIAGNOSIS — I429 Cardiomyopathy, unspecified: Secondary | ICD-10-CM

## 2018-02-12 DIAGNOSIS — Z95810 Presence of automatic (implantable) cardiac defibrillator: Secondary | ICD-10-CM

## 2018-02-12 DIAGNOSIS — I480 Paroxysmal atrial fibrillation: Secondary | ICD-10-CM

## 2018-02-12 DIAGNOSIS — I4891 Unspecified atrial fibrillation: Secondary | ICD-10-CM

## 2018-02-17 ENCOUNTER — Ambulatory Visit (HOSPITAL_BASED_OUTPATIENT_CLINIC_OR_DEPARTMENT_OTHER)
Admission: RE | Admit: 2018-02-17 | Discharge: 2018-02-17 | Disposition: A | Discharge status: Home / Self Care | Payer: Medicare Other | Source: Ambulatory Visit | Attending: Emergency Medicine | Admitting: Emergency Medicine

## 2018-02-17 DIAGNOSIS — J849 Interstitial pulmonary disease, unspecified: Secondary | ICD-10-CM | POA: Diagnosis not present

## 2018-02-17 DIAGNOSIS — J449 Chronic obstructive pulmonary disease, unspecified: Secondary | ICD-10-CM | POA: Diagnosis not present

## 2018-02-18 ENCOUNTER — Other Ambulatory Visit: Payer: Self-pay | Admitting: Emergency Medicine

## 2018-02-18 ENCOUNTER — Other Ambulatory Visit: Payer: Self-pay | Admitting: Cardiology

## 2018-02-18 DIAGNOSIS — I059 Rheumatic mitral valve disease, unspecified: Secondary | ICD-10-CM

## 2018-02-18 DIAGNOSIS — Z95810 Presence of automatic (implantable) cardiac defibrillator: Secondary | ICD-10-CM

## 2018-02-18 DIAGNOSIS — I429 Cardiomyopathy, unspecified: Secondary | ICD-10-CM

## 2018-02-18 DIAGNOSIS — I480 Paroxysmal atrial fibrillation: Secondary | ICD-10-CM

## 2018-02-19 ENCOUNTER — Ambulatory Visit (INDEPENDENT_AMBULATORY_CARE_PROVIDER_SITE_OTHER): Payer: Medicare Other | Admitting: Emergency Medicine

## 2018-02-19 ENCOUNTER — Encounter: Payer: Self-pay | Admitting: Emergency Medicine

## 2018-02-19 DIAGNOSIS — J449 Chronic obstructive pulmonary disease, unspecified: Secondary | ICD-10-CM | POA: Diagnosis not present

## 2018-02-19 DIAGNOSIS — I255 Ischemic cardiomyopathy: Secondary | ICD-10-CM

## 2018-02-19 DIAGNOSIS — J849 Interstitial pulmonary disease, unspecified: Secondary | ICD-10-CM | POA: Diagnosis not present

## 2018-02-19 NOTE — Assessment & Plan Note (Signed)
Stable radiographically and by PFT.  I think the risk-benefit now favors staying off amiodarone.  If there was a strongly compelling reason to get back on it from a cardiac standpoint then we certainly could consider but we would need to increase his surveillance frequency

## 2018-02-19 NOTE — Patient Instructions (Signed)
Recommend that we stay off amiodarone at this time.  Your CT scan and your pulmonary function testing are both stable Please continue Symbicort 2 puffs twice daily.  Remember to rinse and gargle after use this medication. Keep albuterol available to use 2 puffs up to every 4 hours if needed for shortness of breath, chest tightness, wheezing.  Follow with Dr. Lamonte Sakai in 12 months or sooner if you have any problems.  We will decide at that time whether to follow you with a chest x-ray or to repeat your CT scan of the chest.

## 2018-02-19 NOTE — Assessment & Plan Note (Signed)
Stable pulmonary function testing and stable clinically.  Is having less cough and was actually able to get off of his omeprazole without any flaring.  Continue same Symbicort, albuterol as needed

## 2018-02-19 NOTE — Progress Notes (Signed)
Subjective:    Patient ID: [name], male    DOB: 01/25/1940, 78 y.o.   MRN: [id number]  HPI 78 year old former smoker with a history of hypertension, atrial fibrillation on amiodarone, coronary disease with ischemic cardiomyopathy with ICD/pacer, formerly followed by Dr Gwenette Greet for obstructive sleep apnea on CPAP 16 cm water. He stopped using CPAP about 2 years ago due to dryness. He has tried multiple masks and humidity, but has been unable to tolerate. He is fatigued, is up at night freq to urinate. He underwent PFT that I have reviewed, shows mild mixed disease, restrictive disease on volumes, decreased diffusion capacity that corects for [place].  He describes cough that is likely related to chronic rhintis, partially treated GERD. He recently changed from flonase to ipratropium which has helped. He still coughs up secretions in the am. He was just started on albuterol - seems to benefit from this.          ROV 05/18/15 -- follow-up visit for history of dyspnea in the setting of hypertension, atrial fibrillation on amiodarone, mixed disease on pulmonary function testing, untreated obstructive sleep apnea. He had not been treated with scheduled bronchodilators but had benefited from albuterol and so we started Symbicort which was then substituted with [employer] for his insurance formulary. He was also stared on omeprazole by Dr Erik Obey, changed fluticasone to ipratropium nasal spray > cough and mucous are better. He also underwent a CT scan of the chest on 04/04/15 that person reviewed. This showed evidence for several small pulmonary nodules with some patchy subpleural reticulation and traction bronchiectasis consistent with possible early UIP.  He starts that his cough and dyspnea are significantly improved on the new meds.   ROV 11/14/15 -- This is a follow-up visit for patient with a history of tobacco use and newly diagnosed COPD. He also has a history of obstructive sleep apnea that has not been treated  due to CPAP intolerance. He has chronic rhinitis and is on ipratropium nasal spray. Also treated with GERD for omeprazole. Both of these interventions have helped his chronic cough. He has some very mild interstitial changes with associated nodularity on CT scan of the chest, question related to amiodarone exposure. Repeat CT scan of his chest was done on 09/20/15 that I have personally reviewed. This shows stable right lower lobe and left lower lobe pulmonary nodules. No new nodules were seen. There was no change in his bilateral interstitial disease. No auto-immune eval to date.   ROV 03/26/16 -- this is a follow-up visit for COPD. Also w a hx of chronic rhinitis, GERD-related cough. Stable B lower lobe nodular ILD on CT scan, last on 03/19/16 which I have reviewed >> shows no LAD, stable 6x46m LLL nodule, chronic interstitial changes, no new findings. He has remained on amiodarone. He is on loratadine qd, omeprazole. Has atrovent NS to use prn. He recently stopped nasal washes. On symbicort - feels that it has helped his breathing. Denies fever, chills. Coughing up greenish sputum.  ROV 09/26/16 -- patient has a history of COPD, chronic rhinitis, GERD, cough. He also has stable bilateral lower lobe interstitial disease by CT scan of the chest, stable 6 x 8 mm left lower lobe nodule. He reports that he is doing fairly well. He has a lot of nasal gtt, he is on flonase, temporarily stopped ipratropium NS. He feels that lately his gtt is worse - wants to go back on the ipratropium. Remains on loratadine. Rare albuterol use.  Needs ventolin refill.   Remains on amiodarone.           ROV 03/12/17 --78 year old gentleman who follows up today for history of COPD and stable bilateral lower lobe  interstitial disease that we followed by CT scan of the chest.  We never were able to establish a relationship with amiodarone and he remains on the amiodarone now.  A CT scan of the chest was done on 2/26 which I have reviewed.  There has been minimal change and progression since 03/19/16, a more noticeable change compared back to 09/29/13.  He underwent pulmonary function testing today and I have reviewed.  This shows mixed restriction and obstruction on spirometry, no bronchodilator response.  His FEV1 is 2.30 (72% predicted).  This is down slightly from his previous PFTs 02/08/15.  His current lung volumes show restriction, diffusion capacity is decreased but corrects to the normal range when adjusted for his alveolar volume.  Both of these are stable compared with 2017.   He reports that he has had nasal gtt and cough for the last week. Some wheeze - has prompted him to use albuterol bid, which he rarely does. He is concerned that it may have impacted his PFTs     ROV 05/27/17 --follow-up visit today for 78 year old man with a history of CAD, ischemic cardia myopathy, ICD/pacer, obstructive sleep apnea on CPAP.  COPD, stable bilateral lower lobe interstitial disease, mixed obstruction and restriction on spirometry (last PFT 02/2017).  He also has allergic rhinitis with some associated cough. He has been on amiodarone for A fib, stopped since last time, although there is some concern that he is high risk for arrhythmia. The changes on his CT chest are subtle. He reports that he feels well.  He is on symbicort bid, uses albuterol about qd. On flonase, nasal saline, loratadine, omeprazole.         ROV 02/19/18 --78 year old man who follows up today for mixed obstruction and restriction, COPD as well as stable bilateral lower lobe interstitial disease.  We questioned possible amiodarone toxicity and this was stopped.  He had a repeat high-resolution CT scan of the chest on 02/17/2018 that I reviewed.  This shows some  mild emphysematous change irregular peripheral interstitial disease with some mild associated bronchiectasis, no significant honeycomb change.  Unchanged compared with his prior 1 year ago.  Pulmonary function testing done on 02/09/2018 shows mild mixed restriction and obstruction, overall stable compared with his prior 1 year ago.  Review of Systems As per HPI      Objective:   Physical Exam Vitals:   02/19/18 1110  BP: 118/62  Pulse: 72  SpO2: 94%  Weight: 219 lb (99.3 kg)  Height: _0  (1.778 m)   Gen: Pleasant, well-nourished, in no distress,  normal affect  ENT: No lesions,  mouth clear,  oropharynx clear, no postnasal drip  Neck: No JVD, no stridor  Lungs: No use of accessory muscles, clear without rales or rhonchi  Cardiovascular: RRR, heart sounds normal, no murmur or gallops, no peripheral edema  Musculoskeletal: No deformities, no cyanosis or clubbing  Neuro: alert, non focal  Skin: Warm, no lesions or rashes      Assessment & Plan:  COPD (chronic obstructive pulmonary disease) (HCC) Stable pulmonary function testing and stable clinically.  Is having less cough and was actually able to get off of his omeprazole without any flaring.  Continue same Symbicort, albuterol as needed  Interstitial lung disease ([place]) Stable radiographically and by PFT.  I think the risk-benefit now favors staying off amiodarone.  If there was a strongly compelling reason to get back on it from a cardiac standpoint then we certainly could consider but we would need to increase his surveillance frequency  Baltazar Apo, MD, PhD 02/19/2018, 11:41 AM  Pulmonary and Critical Care [phone] or if no answer [phone]

## 2018-03-09 ENCOUNTER — Other Ambulatory Visit: Payer: Self-pay | Admitting: Emergency Medicine

## 2018-03-14 ENCOUNTER — Other Ambulatory Visit: Payer: Self-pay | Admitting: Cardiology

## 2018-03-24 ENCOUNTER — Other Ambulatory Visit: Payer: Self-pay | Admitting: Emergency Medicine

## 2018-03-25 ENCOUNTER — Other Ambulatory Visit: Payer: Self-pay

## 2018-03-25 ENCOUNTER — Encounter: Payer: Self-pay | Admitting: Cardiology

## 2018-03-25 ENCOUNTER — Ambulatory Visit (INDEPENDENT_AMBULATORY_CARE_PROVIDER_SITE_OTHER): Payer: Medicare Other | Admitting: Cardiology

## 2018-03-25 VITALS — BP 106/68 | HR 64 | Ht 70.0 in | Wt 219.4 lb

## 2018-03-25 DIAGNOSIS — I2510 Atherosclerotic heart disease of native coronary artery without angina pectoris: Secondary | ICD-10-CM

## 2018-03-25 DIAGNOSIS — I480 Paroxysmal atrial fibrillation: Secondary | ICD-10-CM

## 2018-03-25 DIAGNOSIS — I255 Ischemic cardiomyopathy: Secondary | ICD-10-CM | POA: Diagnosis not present

## 2018-03-25 DIAGNOSIS — I10 Essential (primary) hypertension: Secondary | ICD-10-CM | POA: Diagnosis not present

## 2018-03-25 DIAGNOSIS — E7800 Pure hypercholesterolemia, unspecified: Secondary | ICD-10-CM | POA: Diagnosis not present

## 2018-03-25 MED ORDER — ROSUVASTATIN CALCIUM 10 MG PO TABS: 10.0000 mg | ORAL_TABLET | Freq: Every day | ORAL | 3 refills | Status: DC

## 2018-03-25 NOTE — Patient Instructions (Signed)
Medication Instructions:   Start rosuvastatin 10 mg once daily  Labwork:  Your physician recommends that you return for lab work in: 6 weeks prior to eating  Follow-Up:  Your physician wants you to follow-up in: 6 months with dr Stanford Breed Dennis Bast will receive a reminder letter in the mail two months in advance. If you don't receive a letter, please call our office to schedule the follow-up appointment.   Call in July to schedule appointment in september

## 2018-03-25 NOTE — Progress Notes (Signed)
HPI: FU history of coronary artery disease, mixed ischemic and nonischemic cardiomyopathy, history of ICD, paroxysmal atrial fibrillation, and ventricular tachycardia. Previous abdominal ultrasound in April 2006 showed no aneurysm. Last cardiac catheterization in October 2008 showed an occluded right coronary artery with left-to-right collateralization. Ejection fraction is 20%. There was no other coronary disease noted. Last Myoview in Dec 2011 showed prior inferior infarct but no ischemia. His ejection fraction was 19%. ABIs in December of 2011 were normal. Previous carotid Dopplers in Sept 2014 showed 0-39% stenosis bilaterally.The patient has had upgrade of his device to CRT-D. Patient had revision of his left ventricular lead in December 2013. Echocardiogram January 2017 showed ejection fraction 02-21%, grade 1 diastolic dysfunction, mild mitral regurgitation, severe left atrial enlargement mild right sided enlargement and mild tricuspid regurgitation. Amiodarone discontinued March 2019 because of potential pulmonary toxicity.    High resolution chest CT February 2020 showed mild pulmonary fibrosis, mild emphysema.  Since last seen,there is no dyspnea, chest pain, palpitations or syncope.  Current Outpatient Medications  Medication Sig Dispense Refill  . antiseptic oral rinse (BIOTENE) LIQD 15 mLs by Mouth Rinse route as needed for dry mouth (TID).    [name] carvedilol (COREG) 12.5 MG tablet Take 1 tablet (12.5 mg total) by mouth 2 (two) times daily with a meal. 180 tablet 2  . cholecalciferol (VITAMIN D) 1000 units tablet Take 1,000 Units by mouth daily.    [name] ENTRESTO 24-26 MG TAKE 1 TABLET BY MOUTH TWICE DAILY 180 tablet 3  . eplerenone (INSPRA) 25 MG tablet TAKE 1 TABLET BY MOUTH EVERY DAY 30 tablet 2  . ezetimibe (ZETIA) 10 MG tablet TAKE 1 TABLET(10 MG) BY MOUTH DAILY 30 tablet 6  . fluticasone (FLONASE) 50 MCG/ACT nasal spray SHAKE LIQUID AND USE 2 SPRAYS IN EACH NOSTRIL DAILY 16 g 2  .  furosemide (LASIX) 40 MG tablet TAKE 2 TABLETS(80 MG) BY MOUTH DAILY 180 tablet 1  . ipratropium (ATROVENT) 0.06 % nasal spray USE 2 SPRAYS IN EACH NOSTRIL THREE TIMES DAILY 15 mL 5  . loratadine (CLARITIN) 10 MG tablet Take 10 mg by mouth daily.    . Magnesium 250 MG TABS Take 1 tablet by mouth daily.    [name] mexiletine (MEXITIL) 150 MG capsule TAKE 2 CAPSULES BY MOUTH TWICE DAILY 360 capsule 0  . Multiple Vitamin (MULTIVITAMIN WITH MINERALS) TABS Take 1 tablet by mouth daily.    . nitroGLYCERIN (NITROSTAT) 0.4 MG SL tablet DISSOLVE 1 TABLET UNDER THE TONGUE EVERY 5 MINUTES AS NEEDED FOR CHEST PAIN 25 tablet 5  . omeprazole (PRILOSEC) 20 MG capsule Take 20 mg by mouth daily.    . polyvinyl alcohol-povidone (REFRESH) 1.4-0.6 % ophthalmic solution Place 1-2 drops into both eyes as needed (dry eyes).     . potassium chloride SA (K-DUR,KLOR-CON) 20 MEQ tablet TAKE 1 TABLET(20 MEQ) BY MOUTH DAILY 90 tablet 3  . Probiotic Product (PROBIOTIC DAILY PO) Take 1 tablet by mouth daily.    . SYMBICORT 160-4.5 MCG/ACT inhaler INHALE 2 PUFFS INTO THE LUNGS TWICE DAILY 3 Inhaler 1  . VENTOLIN HFA 108 (90 Base) MCG/ACT inhaler INHALE 1 TO 2 PUFFS INTO THE LUNGS EVERY 4 HOURS AS NEEDED FOR WHEEZING OR SHORTNESS OF BREATH 18 g 5  . Wheat Dextrin (BENEFIBER PO) Take 2 Scoops by mouth 2 (two) times daily.    Alveda Reasons 20 MG TABS tablet TAKE 1 TABLET BY MOUTH EVERY DAY WITH DINNER 90 tablet 1   No  current facility-administered medications for this visit.      Past Medical History:  Diagnosis Date  . Adenomatous colon polyp 2009; 06/2012   Colonoscopy 07/2007---Dr. Barron Schmid GI--Repeat 06/2012 showed one tubular adenoma w/out high grade dysplasia.  [name] AICD (automatic cardioverter/defibrillator) present    [employer]  . Atrial fibrillation ([place])   . Bilateral sensorineural hearing loss   . BPH (benign prostatic hypertrophy)    PSAs ok (followed by urologist): no LUTS  . CAD (coronary artery disease)     PTCA RCA 1997 (Inf/post MI);  . CHF (congestive heart failure) ([place])   . Chronic constipation   . Chronic rhinitis    ?vasomotor (Dr. Erik Obey)  . COPD (chronic obstructive pulmonary disease) ([place])   . Diverticulosis of colon 2005   Noted on Colonoscopies in 2005 and 2009 and on CT 2010  . Gallstones 06/2014   No evidence of cholecystitis on abd u/s 06/2014  . GERD (gastroesophageal reflux disease)   . Hepatic steatosis 2010; 2016   Noted on CT abd 2010 and on u/s abd 04/2009 (transaminasemia) and u/s 06/2014.  [name] Hyperlipidemia   . Hypertension   . Ischemic cardiomyopathy   . Microscopic hematuria    Cysto neg 02/2009 except for BPH-likely has microhem from his renal stone dz  . Nephrolithiasis    No distal stones on CT 2010; stable 8 mm RLP stone, 23m LMP stone 10/2013 plain film.  Abd u/s 06/2014: 9 mm nonobstructing lower pole right renal stone.  03/2015 urol: stable nonobstructing renal calc by CT 12/2014 and KUB 03/2015.  .Marland KitchenObstructive sleep apnea    no cpap uses since weight loss of 50 pounds  . Presence of permanent cardiac pacemaker    [employer] . Squamous cell carcinoma of back   . Ventricular tachycardia ([facility]     Past Surgical History:  Procedure Laterality Date  . ATRIAL ABLATION SURGERY     flutter ablation 2002  . BREAST SURGERY Left 1990   "Tissue removed from breast"  . CARDIAC CATHETERIZATION    . carotid doppler  09/25/12   No signif stenosis  . COLONOSCOPY N/A 07/13/2012   Diverticula ascending colon.  Polyp x 1-recall 5 yrs (Dr. MWatt Climes Procedure: COLONOSCOPY;  Surgeon: MJeryl Columbia MD;  Location: [facility];  Service: Endoscopy;  Laterality: N/A; polypectomy x 1   . COLONOSCOPY WITH PROPOFOL N/A 08/18/2015   Procedure: COLONOSCOPY WITH PROPOFOL;  Surgeon: KMauri Pole MD;  Location: [facility];  Service: Endoscopy;  Laterality: N/A;  . ESOPHAGOGASTRODUODENOSCOPY N/A 07/13/2012   Procedure: ESOPHAGOGASTRODUODENOSCOPY (EGD);  Surgeon: MJeryl Columbia  MD;  [facility];  Service: Endoscopy;  Laterality: N/A;  NORMAL  . EXTRACORPOREAL SHOCK WAVE LITHOTRIPSY    . HEMORRHOID BANDING    . ICD     a) Guidant Contak H170- SVirl Axe MD 07/25/06 (second device) b) Medtronic CRT-D  . INGUINAL HERNIA REPAIR Bilateral 1975  . LEAD REVISION Bilateral 12/26/2011   Procedure: LEAD REVISION;  Surgeon: GEvans Lance MD;  [facility];  Service: Cardiovascular;  Laterality: Bilateral;  . PFTs  02/2015   Mod restriction (interstitial), mod diffusion defect (Dr. YAnnamaria Boots  . precancer area keratsis removed  05/2015    leftf [place] and left arm and right arm and back    Social History   Socioeconomic History  . Marital status: Married    Spouse name: Not on file  . Number of children: 3  . Years of education:  Not on file  . Highest education level: Not on file  Occupational History  . Occupation: Retired    Fish farm manager: RETIRED  Social Needs  . Financial resource strain: Not on file  . Food insecurity:    Worry: Not on file    Inability: Not on file  . Transportation needs:    Medical: Not on file    Non-medical: Not on file  Tobacco Use  . Smoking status: Former Smoker    Packs/day: 2.00    Years: 15.00    Pack years: 30.00    Types: Cigarettes    Last attempt to quit: 01/15/1980    Years since quitting: 38.2  . Smokeless tobacco: Never Used  Substance and Sexual Activity  . Alcohol use: No    Alcohol/week: 0.0 standard drinks  . Drug use: No  . Sexual activity: Not on file  Lifestyle  . Physical activity:    Days per week: Not on file    Minutes per session: Not on file  . Stress: Not on file  Relationships  . Social connections:    Talks on phone: Not on file    Gets together: Not on file    Attends religious service: Not on file    Active member of club or organization: Not on file    Attends meetings of clubs or organizations: Not on file    Relationship status: Not on file  . Intimate partner violence:     Fear of current or ex partner: Not on file    Emotionally abused: Not on file    Physically abused: Not on file    Forced sexual activity: Not on file  Other Topics Concern  . Not on file  Social History Narrative   Married.   Hx of cigarettes: quit in the 1980s.  No alc/drugs.    Family History  Problem Relation Age of Onset  . Heart disease Mother   . Other Mother        colon surgery for fistula  . Gallstones Mother   . Breast cancer Maternal Grandmother   . Coronary artery disease Father        family hx of  . Heart attack Father        4 stents/ pacemaker  . Nephrolithiasis Father   . Hypertension Sister   . Goiter Paternal Grandmother   . Nephrolithiasis Son   . Colon cancer Neg Hx     ROS: no fevers or chills, productive cough, hemoptysis, dysphasia, odynophagia, melena, hematochezia, dysuria, hematuria, rash, seizure activity, orthopnea, PND, pedal edema, claudication. Remaining systems are negative.  Physical Exam: Well-developed well-nourished in no acute distress.  Skin is warm and dry.  HEENT is normal.  Neck is supple.  Chest is clear to auscultation with normal expansion.  Cardiovascular exam is regular rate and rhythm.  Abdominal exam nontender or distended. No masses palpated. Extremities show no edema. neuro grossly intact  A/P  1 coronary artery disease-patient denies chest pain.  Plan to continue medical therapy with aspirin.  Resume statin.  2 ischemic cardiomyopathy-continue medical therapy with carvedilol and Entresto.  3 chronic systolic congestive heart failure-patient appears to be euvolemic on examination today.  Continue present dose of Lasix.  Continue fluid restriction and low-sodium diet.  4 paroxysmal atrial fibrillation-he remains in sinus rhythm today on examination.  We will continue present dose of Xarelto.  Continue carvedilol for rate control if atrial fibrillation recurs.  5 hypertension-patient's blood pressure is  controlled.  Continue present medications and follow.  6 history of ventricular tachycardia-continue carvedilol and mexiletine.  Amiodarone was discontinued previously because of potential lung toxicity though not definite.  We can consider resuming if he has more frequent episodes in the future.  7 biventricular ICD-followed by electrophysiology.  8 hyperlipidemia-patient was placed on Crestor at previous office visit but he thought it may be giving him a rash but he is unsure.  We will resume 10 mg daily and follow.  If he tolerates we will check lipids and liver in 6 weeks and adjust as needed.  If he does not tolerate will I will check cholesterol and if LDL greater than 70 referred to the lipid clinic for consideration of Repatha.  Kirk Ruths, MD

## 2018-03-26 ENCOUNTER — Other Ambulatory Visit: Payer: Self-pay | Admitting: Emergency Medicine

## 2018-04-01 ENCOUNTER — Other Ambulatory Visit: Payer: Self-pay

## 2018-04-01 ENCOUNTER — Ambulatory Visit: Payer: Medicare Other | Admitting: *Deleted

## 2018-04-01 DIAGNOSIS — I255 Ischemic cardiomyopathy: Secondary | ICD-10-CM

## 2018-04-01 LAB — CUP PACEART REMOTE DEVICE CHECK
Battery Remaining Longevity: 24 mo
Battery Remaining Percentage: 41 %
Brady Statistic RA Percent Paced: 96 %
Brady Statistic RV Percent Paced: 100 %
Date Time Interrogation Session: 20200318070100
HighPow Impedance: 49 Ohm
Implantable Lead Implant Date: 20020527
Implantable Lead Implant Date: 20020527
Implantable Lead Implant Date: 20131212
Implantable Lead Location: 753858
Implantable Lead Location: 753859
Implantable Lead Location: 753860
Implantable Lead Model: 148
Implantable Lead Model: 4194
Implantable Lead Model: 6940
Implantable Lead Serial Number: 120244
Lead Channel Impedance Value: 543 Ohm
Lead Channel Impedance Value: 599 Ohm
Lead Channel Impedance Value: 881 Ohm
Lead Channel Pacing Threshold Amplitude: 0.7 V
Lead Channel Pacing Threshold Amplitude: 1 V
Lead Channel Pacing Threshold Amplitude: 1.9 V
Lead Channel Pacing Threshold Pulse Width: 0.3 ms
Lead Channel Pacing Threshold Pulse Width: 0.4 ms
Lead Channel Pacing Threshold Pulse Width: 1 ms
Lead Channel Setting Pacing Amplitude: 2 V
Lead Channel Setting Pacing Amplitude: 2.5 V
Lead Channel Setting Pacing Amplitude: 2.5 V
Lead Channel Setting Pacing Pulse Width: 0.4 ms
Lead Channel Setting Pacing Pulse Width: 1 ms
Lead Channel Setting Sensing Sensitivity: 0.5 mV
Lead Channel Setting Sensing Sensitivity: 1 mV
MDC IDC PG IMPLANT DT: 20111207
Pulse Gen Serial Number: 492371

## 2018-04-03 ENCOUNTER — Telehealth: Payer: Self-pay | Admitting: Emergency Medicine

## 2018-04-03 NOTE — Telephone Encounter (Signed)
Spoke with Almyra Free at [facility].  She stated the symbicort should be 1 inhaler for 30 days.  I agreed and she stated she would send message to pharmacy.  Nothing further is needed.

## 2018-04-09 ENCOUNTER — Encounter: Payer: Self-pay | Admitting: Cardiology

## 2018-04-09 ENCOUNTER — Other Ambulatory Visit: Payer: Self-pay | Admitting: Cardiology

## 2018-04-09 NOTE — Progress Notes (Signed)
Remote ICD transmission.   

## 2018-04-25 ENCOUNTER — Other Ambulatory Visit: Payer: Self-pay | Admitting: Cardiology

## 2018-04-25 DIAGNOSIS — I480 Paroxysmal atrial fibrillation: Secondary | ICD-10-CM

## 2018-04-25 DIAGNOSIS — I059 Rheumatic mitral valve disease, unspecified: Secondary | ICD-10-CM

## 2018-04-25 DIAGNOSIS — Z95810 Presence of automatic (implantable) cardiac defibrillator: Secondary | ICD-10-CM

## 2018-04-25 DIAGNOSIS — I429 Cardiomyopathy, unspecified: Secondary | ICD-10-CM

## 2018-04-25 DIAGNOSIS — I4891 Unspecified atrial fibrillation: Secondary | ICD-10-CM

## 2018-04-27 NOTE — Telephone Encounter (Signed)
Rx(s) sent to pharmacy electronically.

## 2018-05-07 ENCOUNTER — Other Ambulatory Visit: Payer: Self-pay | Admitting: Emergency Medicine

## 2018-05-20 ENCOUNTER — Other Ambulatory Visit: Payer: Self-pay | Admitting: Emergency Medicine

## 2018-07-01 ENCOUNTER — Ambulatory Visit (INDEPENDENT_AMBULATORY_CARE_PROVIDER_SITE_OTHER): Payer: Medicare Other | Admitting: *Deleted

## 2018-07-01 DIAGNOSIS — I255 Ischemic cardiomyopathy: Secondary | ICD-10-CM

## 2018-07-01 LAB — CUP PACEART REMOTE DEVICE CHECK
Battery Remaining Longevity: 24 mo
Battery Remaining Percentage: 36 %
Brady Statistic RA Percent Paced: 97 %
Brady Statistic RV Percent Paced: 100 %
Date Time Interrogation Session: 20200617070100
HighPow Impedance: 50 Ohm
Implantable Lead Implant Date: 20020527
Implantable Lead Implant Date: 20020527
Implantable Lead Implant Date: 20131212
Implantable Lead Location: 753858
Implantable Lead Location: 753859
Implantable Lead Location: 753860
Implantable Lead Model: 148
Implantable Lead Model: 4194
Implantable Lead Model: 6940
Implantable Lead Serial Number: 120244
Implantable Pulse Generator Implant Date: 20111207
Lead Channel Impedance Value: 555 Ohm
Lead Channel Impedance Value: 611 Ohm
Lead Channel Impedance Value: 894 Ohm
Lead Channel Pacing Threshold Amplitude: 0.7 V
Lead Channel Pacing Threshold Amplitude: 1 V
Lead Channel Pacing Threshold Amplitude: 1.9 V
Lead Channel Pacing Threshold Pulse Width: 0.3 ms
Lead Channel Pacing Threshold Pulse Width: 0.4 ms
Lead Channel Pacing Threshold Pulse Width: 1 ms
Lead Channel Setting Pacing Amplitude: 2 V
Lead Channel Setting Pacing Amplitude: 2.5 V
Lead Channel Setting Pacing Amplitude: 2.5 V
Lead Channel Setting Pacing Pulse Width: 0.4 ms
Lead Channel Setting Pacing Pulse Width: 1 ms
Lead Channel Setting Sensing Sensitivity: 0.5 mV
Lead Channel Setting Sensing Sensitivity: 1 mV
Pulse Gen Serial Number: 492371

## 2018-07-13 ENCOUNTER — Encounter: Payer: Self-pay | Admitting: Cardiology

## 2018-07-13 NOTE — Progress Notes (Signed)
Remote ICD transmission.

## 2018-07-15 ENCOUNTER — Other Ambulatory Visit: Payer: Self-pay | Admitting: Family Medicine

## 2018-07-15 ENCOUNTER — Other Ambulatory Visit: Payer: Self-pay | Admitting: Cardiology

## 2018-07-15 DIAGNOSIS — I480 Paroxysmal atrial fibrillation: Secondary | ICD-10-CM

## 2018-07-15 DIAGNOSIS — I429 Cardiomyopathy, unspecified: Secondary | ICD-10-CM

## 2018-07-15 DIAGNOSIS — Z95810 Presence of automatic (implantable) cardiac defibrillator: Secondary | ICD-10-CM

## 2018-07-15 DIAGNOSIS — I059 Rheumatic mitral valve disease, unspecified: Secondary | ICD-10-CM

## 2018-07-15 NOTE — Telephone Encounter (Signed)
Patient has not been seen by PCP since 06/11/17

## 2018-07-23 ENCOUNTER — Encounter: Payer: Self-pay | Admitting: Family Medicine

## 2018-07-23 ENCOUNTER — Ambulatory Visit (INDEPENDENT_AMBULATORY_CARE_PROVIDER_SITE_OTHER): Payer: Medicare Other | Admitting: Family Medicine

## 2018-07-23 ENCOUNTER — Other Ambulatory Visit: Payer: Self-pay

## 2018-07-23 VITALS — BP 95/66 | HR 72 | Temp 97.1°F | Ht 70.0 in | Wt 208.0 lb

## 2018-07-23 DIAGNOSIS — E7800 Pure hypercholesterolemia, unspecified: Secondary | ICD-10-CM | POA: Diagnosis not present

## 2018-07-23 DIAGNOSIS — I10 Essential (primary) hypertension: Secondary | ICD-10-CM | POA: Diagnosis not present

## 2018-07-23 NOTE — Progress Notes (Signed)
I have discussed the procedure for the virtual visit with the patient who has given consent to proceed with assessment and treatment.   Davis Gourd, CMA

## 2018-07-23 NOTE — Progress Notes (Signed)
Virtual Visit via Telephone Note  I connected with [name] on 07/23/18 at  2:20 PM EDT by telephone and verified that I am speaking with the correct person using two identifiers.  Location: Patient: Mariann Barter Provider: Alver Fisher, MD   I discussed the limitations, risks, security and privacy concerns of performing an evaluation and management service by telephone and the availability of in person appointments. I also discussed with the patient that there may be a patient responsible charge related to this service. The patient expressed understanding and agreed to proceed.   History of Present Illness: Hyperlipidemia- pt stopped taking his Crestor in the winter b/c of dry skin, which he thought was an allergic rxn.  Pt reports he has lost weight and is 'feeling good'.  Physically active in the garden.  Pt remains on Zetia.  Wants to restart Crestor and see if has any skin issues this time.  HTN- chronic problem, on Coreg 12.70m BID.  No CP, SOB, HAs, visual changes, edema.  BP is excellently controlled.   Observations/Objective: Pt is able to speak clearly, coherently without shortness of breath or increased work of breathing.  Thought process is linear.  Mood is appropriate.   Assessment and Plan: HTN- chronic problem.  Well controlled.  Currently asymptomatic.  No anticipated med changes.  Will follow.  Hyperlipidemia- pt to restart Crestor in addition to his Zetia.  Encouraged healthy diet and regular exercise.  Will follow.  Follow Up Instructions:    I discussed the assessment and treatment plan with the patient. The patient was provided an opportunity to ask questions and all were answered. The patient agreed with the plan and demonstrated an understanding of the instructions.   The patient was advised to call back or seek an in-person evaluation if the symptoms worsen or if the condition fails to improve as anticipated.  I provided 12 minutes of non-face-to-face time during  this encounter.   KAnnye Asa MD

## 2018-07-28 ENCOUNTER — Other Ambulatory Visit: Payer: Self-pay | Admitting: Cardiology

## 2018-07-28 NOTE — Telephone Encounter (Signed)
77yom, 94.3kg, scr 0.98(01/22/18), ccr 86, lovw/crenshaw(03/25/18), dx afib.

## 2018-09-01 DIAGNOSIS — D1801 Hemangioma of skin and subcutaneous tissue: Secondary | ICD-10-CM | POA: Diagnosis not present

## 2018-09-01 DIAGNOSIS — D692 Other nonthrombocytopenic purpura: Secondary | ICD-10-CM | POA: Diagnosis not present

## 2018-09-01 DIAGNOSIS — L308 Other specified dermatitis: Secondary | ICD-10-CM | POA: Diagnosis not present

## 2018-09-01 DIAGNOSIS — D485 Neoplasm of uncertain behavior of skin: Secondary | ICD-10-CM | POA: Diagnosis not present

## 2018-09-01 DIAGNOSIS — L820 Inflamed seborrheic keratosis: Secondary | ICD-10-CM | POA: Diagnosis not present

## 2018-09-01 DIAGNOSIS — L812 Freckles: Secondary | ICD-10-CM | POA: Diagnosis not present

## 2018-09-01 DIAGNOSIS — L570 Actinic keratosis: Secondary | ICD-10-CM | POA: Diagnosis not present

## 2018-09-01 DIAGNOSIS — L821 Other seborrheic keratosis: Secondary | ICD-10-CM | POA: Diagnosis not present

## 2018-09-03 ENCOUNTER — Other Ambulatory Visit: Payer: Self-pay | Admitting: Emergency Medicine

## 2018-09-23 DIAGNOSIS — Z23 Encounter for immunization: Secondary | ICD-10-CM | POA: Diagnosis not present

## 2018-09-25 ENCOUNTER — Telehealth: Payer: Self-pay | Admitting: Family Medicine

## 2018-09-25 NOTE — Telephone Encounter (Signed)
Pt called in stating that he got his flu shot on 09/23/2018 at [facility]

## 2018-09-25 NOTE — Telephone Encounter (Signed)
Patient's chart is updated.

## 2018-09-27 ENCOUNTER — Other Ambulatory Visit: Payer: Self-pay | Admitting: Emergency Medicine

## 2018-09-30 ENCOUNTER — Ambulatory Visit (INDEPENDENT_AMBULATORY_CARE_PROVIDER_SITE_OTHER): Payer: Medicare Other | Admitting: *Deleted

## 2018-09-30 DIAGNOSIS — I255 Ischemic cardiomyopathy: Secondary | ICD-10-CM

## 2018-09-30 LAB — CUP PACEART REMOTE DEVICE CHECK
Battery Remaining Longevity: 18 mo
Battery Remaining Percentage: 28 %
Brady Statistic RA Percent Paced: 96 %
Brady Statistic RV Percent Paced: 100 %
Date Time Interrogation Session: 20200916071400
HighPow Impedance: 53 Ohm
Implantable Lead Implant Date: 20020527
Implantable Lead Implant Date: 20020527
Implantable Lead Implant Date: 20131212
Implantable Lead Location: 753858
Implantable Lead Location: 753859
Implantable Lead Location: 753860
Implantable Lead Model: 148
Implantable Lead Model: 4194
Implantable Lead Model: 6940
Implantable Lead Serial Number: 120244
Implantable Pulse Generator Implant Date: 20111207
Lead Channel Impedance Value: 562 Ohm
Lead Channel Impedance Value: 610 Ohm
Lead Channel Impedance Value: 967 Ohm
Lead Channel Pacing Threshold Amplitude: 0.7 V
Lead Channel Pacing Threshold Amplitude: 1 V
Lead Channel Pacing Threshold Amplitude: 1.9 V
Lead Channel Pacing Threshold Pulse Width: 0.3 ms
Lead Channel Pacing Threshold Pulse Width: 0.4 ms
Lead Channel Pacing Threshold Pulse Width: 1 ms
Lead Channel Setting Pacing Amplitude: 2 V
Lead Channel Setting Pacing Amplitude: 2.5 V
Lead Channel Setting Pacing Amplitude: 2.5 V
Lead Channel Setting Pacing Pulse Width: 0.4 ms
Lead Channel Setting Pacing Pulse Width: 1 ms
Lead Channel Setting Sensing Sensitivity: 0.5 mV
Lead Channel Setting Sensing Sensitivity: 1 mV
Pulse Gen Serial Number: 492371

## 2018-10-06 ENCOUNTER — Other Ambulatory Visit: Payer: Self-pay

## 2018-10-06 ENCOUNTER — Encounter: Payer: Self-pay | Admitting: Cardiology

## 2018-10-06 MED ORDER — FUROSEMIDE 40 MG PO TABS: ORAL_TABLET | ORAL | 1 refills | Status: DC

## 2018-10-06 NOTE — Progress Notes (Signed)
Remote ICD transmission.   

## 2018-10-07 ENCOUNTER — Other Ambulatory Visit: Payer: Self-pay

## 2018-10-09 ENCOUNTER — Other Ambulatory Visit: Payer: Self-pay | Admitting: General Practice

## 2018-10-09 MED ORDER — EZETIMIBE 10 MG PO TABS: ORAL_TABLET | ORAL | 0 refills | Status: DC

## 2018-10-13 ENCOUNTER — Other Ambulatory Visit: Payer: Self-pay

## 2018-10-13 MED ORDER — CARVEDILOL 12.5 MG PO TABS: 12.5000 mg | ORAL_TABLET | Freq: Two times a day (BID) | ORAL | 2 refills | Status: DC

## 2018-10-14 ENCOUNTER — Other Ambulatory Visit: Payer: Self-pay

## 2018-10-14 MED ORDER — ROSUVASTATIN CALCIUM 10 MG PO TABS: 10.0000 mg | ORAL_TABLET | Freq: Every day | ORAL | 0 refills | Status: DC

## 2018-10-15 DIAGNOSIS — N401 Enlarged prostate with lower urinary tract symptoms: Secondary | ICD-10-CM | POA: Diagnosis not present

## 2018-10-15 DIAGNOSIS — N200 Calculus of kidney: Secondary | ICD-10-CM | POA: Diagnosis not present

## 2018-10-15 DIAGNOSIS — R350 Frequency of micturition: Secondary | ICD-10-CM | POA: Diagnosis not present

## 2018-10-16 ENCOUNTER — Other Ambulatory Visit: Payer: Self-pay

## 2018-10-16 MED ORDER — ROSUVASTATIN CALCIUM 10 MG PO TABS: 10.0000 mg | ORAL_TABLET | Freq: Every day | ORAL | 0 refills | Status: DC

## 2018-10-29 ENCOUNTER — Ambulatory Visit (INDEPENDENT_AMBULATORY_CARE_PROVIDER_SITE_OTHER): Payer: Medicare Other | Admitting: Family Medicine

## 2018-10-29 ENCOUNTER — Encounter: Payer: Self-pay | Admitting: Family Medicine

## 2018-10-29 ENCOUNTER — Other Ambulatory Visit: Payer: Self-pay

## 2018-10-29 VITALS — BP 110/70 | HR 61 | Temp 98.1°F | Resp 16 | Ht 70.0 in | Wt 214.2 lb

## 2018-10-29 DIAGNOSIS — I10 Essential (primary) hypertension: Secondary | ICD-10-CM | POA: Diagnosis not present

## 2018-10-29 DIAGNOSIS — I480 Paroxysmal atrial fibrillation: Secondary | ICD-10-CM

## 2018-10-29 DIAGNOSIS — I255 Ischemic cardiomyopathy: Secondary | ICD-10-CM

## 2018-10-29 DIAGNOSIS — I5022 Chronic systolic (congestive) heart failure: Secondary | ICD-10-CM

## 2018-10-29 DIAGNOSIS — E7800 Pure hypercholesterolemia, unspecified: Secondary | ICD-10-CM | POA: Diagnosis not present

## 2018-10-29 DIAGNOSIS — E669 Obesity, unspecified: Secondary | ICD-10-CM

## 2018-10-29 DIAGNOSIS — E559 Vitamin D deficiency, unspecified: Secondary | ICD-10-CM | POA: Diagnosis not present

## 2018-10-29 DIAGNOSIS — Z0000 Encounter for general adult medical examination without abnormal findings: Secondary | ICD-10-CM

## 2018-10-29 LAB — BASIC METABOLIC PANEL
BUN: 14 mg/dL (ref 6–23)
CO2: 24 mEq/L (ref 19–32)
Calcium: 8.8 mg/dL (ref 8.4–10.5)
Chloride: 103 mEq/L (ref 96–112)
Creatinine, Ser: 0.87 mg/dL (ref 0.40–1.50)
GFR: 85.07 mL/min (ref >60.00)
Glucose, Bld: 118 mg/dL — ABNORMAL HIGH (ref 70–99)
Potassium: 4.1 mEq/L (ref 3.5–5.1)
Sodium: 135 mEq/L (ref 135–145)

## 2018-10-29 LAB — CBC WITH DIFFERENTIAL/PLATELET
Basophils Absolute: 0.1 10*3/uL (ref 0.0–0.1)
Basophils Relative: 1 % (ref 0.0–3.0)
Eosinophils Absolute: 0.6 10*3/uL (ref 0.0–0.7)
Eosinophils Relative: 8.7 % — ABNORMAL HIGH (ref 0.0–5.0)
HCT: 41.6 % (ref 39.0–52.0)
Hemoglobin: 14.2 g/dL (ref 13.0–17.0)
Lymphocytes Relative: 31.6 % (ref 12.0–46.0)
Lymphs Abs: 2 10*3/uL (ref 0.7–4.0)
MCHC: 34.1 g/dL (ref 30.0–36.0)
MCV: 101.7 fl — ABNORMAL HIGH (ref 78.0–100.0)
Monocytes Absolute: 0.6 10*3/uL (ref 0.1–1.0)
Monocytes Relative: 8.7 % (ref 3.0–12.0)
Neutro Abs: 3.2 10*3/uL (ref 1.4–7.7)
Neutrophils Relative %: 50 % (ref 43.0–77.0)
Platelets: 280 10*3/uL (ref 150.0–400.0)
RBC: 4.09 Mil/uL — ABNORMAL LOW (ref 4.22–5.81)
RDW: 13.7 % (ref 11.5–15.5)
WBC: 6.5 10*3/uL (ref 4.0–10.5)

## 2018-10-29 LAB — HEPATIC FUNCTION PANEL
ALT: 18 U/L (ref 0–53)
AST: 19 U/L (ref 0–37)
Albumin: 4 g/dL (ref 3.5–5.2)
Alkaline Phosphatase: 48 U/L (ref 39–117)
Bilirubin, Direct: 0.2 mg/dL (ref 0.0–0.3)
Total Bilirubin: 0.7 mg/dL (ref 0.2–1.2)
Total Protein: 6.4 g/dL (ref 6.0–8.3)

## 2018-10-29 LAB — LIPID PANEL
Cholesterol: 130 mg/dL (ref 0–200)
HDL: 34.8 mg/dL — ABNORMAL LOW (ref >39.00)
LDL Cholesterol: 69 mg/dL (ref 0–99)
NonHDL: 95.25
Total CHOL/HDL Ratio: 4
Triglycerides: 131 mg/dL (ref 0.0–149.0)
VLDL: 26.2 mg/dL (ref 0.0–40.0)

## 2018-10-29 LAB — TSH: TSH: 1.54 u[IU]/mL (ref 0.35–4.50)

## 2018-10-29 LAB — VITAMIN D 25 HYDROXY (VIT D DEFICIENCY, FRACTURES): VITD: 42.41 ng/mL (ref 30.00–100.00)

## 2018-10-29 NOTE — Progress Notes (Signed)
   Subjective:    Patient ID: [name], male    DOB: Mar 23, 1940, 78 y.o.   MRN: [id number]  HPI Here today for MWV.  Risk Factors: HTN- chronic problem, currently well controlled on multiple medications.  Pt reports feeling good.  No CP, HAs, visual changes. Hyperlipidemia- chronic problem, on Crestor 58m daily and Zetia 137mdaily.  No abd pain, N/V Afib- chronic problem, following w/ Cards.  On Xarelto.  Rate controlled on Coreg 12.72m62mID.  No palpitations.   CHF- chronic problem, following w/ Cards.  On Entresto 24/54m69mnspra 272mg9msix 80mg,43miletine 300mg B69m Denies edema. COPD- chronic problem, on Symbicort regularly and Ventolin as needed.  No SOB above baseline, cough more than usual.  Physical Activity: no formal exercise but very active Fall Risk: low Depression: denies current sxs Hearing: mildly decreased to conversational tones and whispered voice ADL's: independent Cognitive: normal linear thought process, memory and attention intact Home Safety: safe at home, lives w/ wife Height, Weight, BMI, Visual Acuity: see vitals, vision corrected to 20/20 w/ glasses Counseling: UTD on colonoscopy, urology.  UTD on immunizations. Labs Ordered: See A&P Care Plan: See A&P   Patient Care Team    Relationship Specialty Notifications Start End  Aleksei Goodlin,Midge MiniumP - General Family Medicine  07/10/15   EskridgFestus Aloensulting Physician Urology  01/25/12   Gross, Michael Bostonnsulting Physician General Surgery  10/21/12   CrenshaLelon Perlansulting Physician Cardiology  04/19/14 3/8/46ckiJodi Marblensulting Physician Otolaryngology  03/25/15   Klein, Deboraha Sprangnsulting Physician Cardiology  06/22/15   McCuen,Luberta Mutternsulting Physician Ophthalmology  07/10/15   Byrum, Collene Gobblensulting Physician Pulmonary Disease  07/10/15   NandigaMauri Polensulting Physician Gastroenterology  12/27/15       Review of Systems For ROS see  HPI     Objective:   Physical Exam Vitals signs reviewed.  Constitutional:      General: He is not in acute distress.    Appearance: Normal appearance. He is well-developed. He is obese.  HENT:     Head: Normocephalic and atraumatic.  Eyes:     Conjunctiva/sclera: Conjunctivae normal.     Pupils: Pupils are equal, round, and reactive to light.  Neck:     Musculoskeletal: Normal range of motion and neck supple.     Thyroid: No thyromegaly.  Cardiovascular:     Rate and Rhythm: Normal rate and regular rhythm.     Heart sounds: Normal heart sounds. No murmur.  Pulmonary:     Effort: Pulmonary effort is normal. No respiratory distress.     Breath sounds: Normal breath sounds.  Abdominal:     General: Bowel sounds are normal. There is no distension.     Palpations: Abdomen is soft.  Lymphadenopathy:     Cervical: No cervical adenopathy.  Skin:    General: Skin is warm and dry.  Neurological:     Mental Status: He is alert and oriented to person, place, and time.     Cranial Nerves: No cranial nerve deficit.  Psychiatric:        Behavior: Behavior normal.           Assessment & Plan:

## 2018-10-29 NOTE — Assessment & Plan Note (Signed)
Ongoing issue.  Pt is very active but no formal exercise.  Encouraged healthy diet.  Will follow.

## 2018-10-29 NOTE — Assessment & Plan Note (Signed)
Check labs and replete prn. 

## 2018-10-29 NOTE — Assessment & Plan Note (Signed)
Chronic problem.  Tolerating statin w/o difficulty.  Check labs.  Adjust meds prn

## 2018-10-29 NOTE — Assessment & Plan Note (Signed)
Chronic problem.  Well controlled.  Currently asymptomatic.  Check labs.  No anticipated med changes.

## 2018-10-29 NOTE — Assessment & Plan Note (Signed)
Currently sinus rhythm.  Asymptomatic.  Anticoagulated and rate controlled.  Has ICD in place.  Following w/ Cards.

## 2018-10-29 NOTE — Patient Instructions (Addendum)
Follow up in 6 months to recheck BP and cholesterol We'll notify you of your lab results and make any changes if needed Continue to work on healthy diet and regular exercise- you're doing great! Call with any questions Stay Safe!!!   Preventive Care 77 Years and Older, Male Preventive care refers to lifestyle choices and visits with your health care provider that can promote health and wellness. This includes:  A yearly physical exam. This is also called an annual well check.  Regular dental and eye exams.  Immunizations.  Screening for certain conditions.  Healthy lifestyle choices, such as diet and exercise. What can I expect for my preventive care visit? Physical exam Your health care provider will check:  Height and weight. These may be used to calculate body mass index (BMI), which is a measurement that tells if you are at a healthy weight.  Heart rate and blood pressure.  Your skin for abnormal spots. Counseling Your health care provider may ask you questions about:  Alcohol, tobacco, and drug use.  Emotional well-being.  Home and relationship well-being.  Sexual activity.  Eating habits.  History of falls.  Memory and ability to understand (cognition).  Work and work Statistician. What immunizations do I need?  Influenza (flu) vaccine  This is recommended every year. Tetanus, diphtheria, and pertussis (Tdap) vaccine  You may need a Td booster every 10 years. Varicella (chickenpox) vaccine  You may need this vaccine if you have not already been vaccinated. Zoster (shingles) vaccine  You may need this after age 15. Pneumococcal conjugate (PCV13) vaccine  One dose is recommended after age 57. Pneumococcal polysaccharide (PPSV23) vaccine  One dose is recommended after age 48. Measles, mumps, and rubella (MMR) vaccine  You may need at least one dose of MMR if you were born in 1957 or later. You may also need a second dose. Meningococcal conjugate  (MenACWY) vaccine  You may need this if you have certain conditions. Hepatitis A vaccine  You may need this if you have certain conditions or if you travel or work in places where you may be exposed to hepatitis A. Hepatitis B vaccine  You may need this if you have certain conditions or if you travel or work in places where you may be exposed to hepatitis B. Haemophilus influenzae type b (Hib) vaccine  You may need this if you have certain conditions. You may receive vaccines as individual doses or as more than one vaccine together in one shot (combination vaccines). Talk with your health care provider about the risks and benefits of combination vaccines. What tests do I need? Blood tests  Lipid and cholesterol levels. These may be checked every 5 years, or more frequently depending on your overall health.  Hepatitis C test.  Hepatitis B test. Screening  Lung cancer screening. You may have this screening every year starting at age 37 if you have a 30-pack-year history of smoking and currently smoke or have quit within the past 15 years.  Colorectal cancer screening. All adults should have this screening starting at age 70 and continuing until age 3. Your health care provider may recommend screening at age 72 if you are at increased risk. You will have tests every 1-10 years, depending on your results and the type of screening test.  Prostate cancer screening. Recommendations will vary depending on your family history and other risks.  Diabetes screening. This is done by checking your blood sugar (glucose) after you have not eaten for a while (  fasting). You may have this done every 1-3 years.  Abdominal aortic aneurysm (AAA) screening. You may need this if you are a current or former smoker.  Sexually transmitted disease (STD) testing. Follow these instructions at home: Eating and drinking  Eat a diet that includes fresh fruits and vegetables, whole grains, lean protein, and  low-fat dairy products. Limit your intake of foods with high amounts of sugar, saturated fats, and salt.  Take vitamin and mineral supplements as recommended by your health care provider.  Do not drink alcohol if your health care provider tells you not to drink.  If you drink alcohol: ? Limit how much you have to 0-2 drinks a day. ? Be aware of how much alcohol is in your drink. In the [place] one drink equals one 12 oz bottle of beer (355 mL), one 5 oz glass of wine (148 mL), or one 1 oz glass of hard liquor (44 mL). Lifestyle  Take daily care of your teeth and gums.  Stay active. Exercise for at least 30 minutes on 5 or more days each week.  Do not use any products that contain nicotine or tobacco, such as cigarettes, e-cigarettes, and chewing tobacco. If you need help quitting, ask your health care provider.  If you are sexually active, practice safe sex. Use a condom or other form of protection to prevent STIs (sexually transmitted infections).  Talk with your health care provider about taking a low-dose aspirin or statin. What's next?  Visit your health care provider once a year for a well check visit.  Ask your health care provider how often you should have your eyes and teeth checked.  Stay up to date on all vaccines. This information is not intended to replace advice given to you by your health care provider. Make sure you discuss any questions you have with your health care provider. Document Released: 01/27/2015 Document Revised: 12/25/2017 Document Reviewed: 12/25/2017 Elsevier Patient Education  2020 [employer].

## 2018-10-29 NOTE — Assessment & Plan Note (Signed)
On multiple medications for his CHF and currently very stable.  Asymptomatic.  Check labs.  Will follow along.

## 2018-10-30 ENCOUNTER — Other Ambulatory Visit (INDEPENDENT_AMBULATORY_CARE_PROVIDER_SITE_OTHER): Payer: Medicare Other

## 2018-10-30 DIAGNOSIS — Z20828 Contact with and (suspected) exposure to other viral communicable diseases: Secondary | ICD-10-CM | POA: Diagnosis not present

## 2018-10-30 DIAGNOSIS — R7309 Other abnormal glucose: Secondary | ICD-10-CM | POA: Diagnosis not present

## 2018-10-30 LAB — HEMOGLOBIN A1C: Hgb A1c MFr Bld: 6 % (ref 4.6–6.5)

## 2018-11-23 ENCOUNTER — Other Ambulatory Visit: Payer: Self-pay | Admitting: Emergency Medicine

## 2018-11-24 NOTE — Progress Notes (Signed)
HPI: FU history of coronary artery disease, mixed ischemic and nonischemic cardiomyopathy, history of ICD, paroxysmal atrial fibrillation, and ventricular tachycardia. Previous abdominal ultrasound in April 2006 showed no aneurysm. Last cardiac catheterization in October 2008 showed an occluded right coronary artery with left-to-right collateralization. Ejection fraction is 20%. There was no other coronary disease noted. Last Myoview in Dec 2011 showed prior inferior infarct but no ischemia. His ejection fraction was 19%. ABIs in December of 2011 were normal. Previous carotid Dopplers in Sept 2014 showed 0-39% stenosis bilaterally.The patient has had upgrade of his device to CRT-D. Patient had revision of his left ventricular lead in December 2013. Echocardiogram January 2017 showed ejection fraction 81-44%, grade 1 diastolic dysfunction, mild mitral regurgitation, severe left atrial enlargement, mild right sided enlargement and mild tricuspid regurgitation. Amiodarone discontinued March 2019 because of potential pulmonary toxicity. High resolution chest CT February 2020 showed mild pulmonary fibrosis, mild emphysema.  Since last seen,patient denies dyspnea, chest pain, palpitations or syncope.  Current Outpatient Medications  Medication Sig Dispense Refill  . antiseptic oral rinse (BIOTENE) LIQD 15 mLs by Mouth Rinse route as needed for dry mouth (TID).    [name] budesonide-formoterol (SYMBICORT) 160-4.5 MCG/ACT inhaler INHALE 2 PUFFS INTO THE LUNGS TWICE DAILY 30.6 g 3  . carvedilol (COREG) 12.5 MG tablet Take 1 tablet (12.5 mg total) by mouth 2 (two) times daily with a meal. 180 tablet 2  . cholecalciferol (VITAMIN D) 1000 units tablet Take 1,000 Units by mouth daily.    [name] ENTRESTO 24-26 MG TAKE 1 TABLET BY MOUTH TWICE DAILY 180 tablet 3  . eplerenone (INSPRA) 25 MG tablet Take 1 tablet (25 mg total) by mouth daily. 30 tablet 11  . ezetimibe (ZETIA) 10 MG tablet TAKE 1 TABLET(10 MG) BY MOUTH  DAILY 90 tablet 0  . fluticasone (FLONASE) 50 MCG/ACT nasal spray SHAKE LIQUID AND USE 2 SPRAYS IN EACH NOSTRIL DAILY 16 g 5  . furosemide (LASIX) 40 MG tablet TAKE 2 TABLETS BY MOUTH EVERY DAY 180 tablet 1  . ipratropium (ATROVENT) 0.06 % nasal spray USE 2 SPRAYS IN EACH NOSTRIL THREE TIMES DAILY 15 mL 0  . loratadine (CLARITIN) 10 MG tablet Take 10 mg by mouth daily.    . Magnesium 250 MG TABS Take 1 tablet by mouth daily.    [name] mexiletine (MEXITIL) 150 MG capsule TAKE 2 CAPSULES(300 MG) BY MOUTH TWICE DAILY 360 capsule 2  . Multiple Vitamin (MULTIVITAMIN WITH MINERALS) TABS Take 1 tablet by mouth daily.    . nitroGLYCERIN (NITROSTAT) 0.4 MG SL tablet DISSOLVE 1 TABLET UNDER THE TONGUE EVERY 5 MINUTES AS NEEDED FOR CHEST PAIN 25 tablet 5  . omeprazole (PRILOSEC) 20 MG capsule Take 20 mg by mouth daily.    . polyvinyl alcohol-povidone (REFRESH) 1.4-0.6 % ophthalmic solution Place 1-2 drops into both eyes as needed (dry eyes).     . potassium chloride SA (K-DUR,KLOR-CON) 20 MEQ tablet TAKE 1 TABLET(20 MEQ) BY MOUTH DAILY 90 tablet 3  . Probiotic Product (PROBIOTIC DAILY PO) Take 1 tablet by mouth daily.    . rosuvastatin (CRESTOR) 10 MG tablet Take 1 tablet (10 mg total) by mouth daily. 90 tablet 0  . VENTOLIN HFA 108 (90 Base) MCG/ACT inhaler INHALE 1 TO 2 PUFFS INTO THE LUNGS EVERY 4 HOURS AS NEEDED FOR WHEEZING OR SHORTNESS OF BREATH 18 g 5  . Wheat Dextrin (BENEFIBER PO) Take 2 Scoops by mouth 2 (two) times daily.    Alveda Reasons  20 MG TABS tablet TAKE 1 TABLET BY MOUTH EVERY DAY WITH DINNER 90 tablet 1   No current facility-administered medications for this visit.      Past Medical History:  Diagnosis Date  . Adenomatous colon polyp 2009; 06/2012   Colonoscopy 07/2007---Dr. Barron Schmid GI--Repeat 06/2012 showed one tubular adenoma w/out high grade dysplasia.  [name] AICD (automatic cardioverter/defibrillator) present    [employer]  . Atrial fibrillation ([place])   . Bilateral sensorineural  hearing loss   . BPH (benign prostatic hypertrophy)    PSAs ok (followed by urologist): no LUTS  . CAD (coronary artery disease)    PTCA RCA 1997 (Inf/post MI);  . CHF (congestive heart failure) ([place])   . Chronic constipation   . Chronic rhinitis    ?vasomotor (Dr. Erik Obey)  . COPD (chronic obstructive pulmonary disease) ([place])   . Diverticulosis of colon 2005   Noted on Colonoscopies in 2005 and 2009 and on CT 2010  . Gallstones 06/2014   No evidence of cholecystitis on abd u/s 06/2014  . GERD (gastroesophageal reflux disease)   . Hepatic steatosis 2010; 2016   Noted on CT abd 2010 and on u/s abd 04/2009 (transaminasemia) and u/s 06/2014.  [name] Hyperlipidemia   . Hypertension   . Ischemic cardiomyopathy   . Microscopic hematuria    Cysto neg 02/2009 except for BPH-likely has microhem from his renal stone dz  . Nephrolithiasis    No distal stones on CT 2010; stable 8 mm RLP stone, 65m LMP stone 10/2013 plain film.  Abd u/s 06/2014: 9 mm nonobstructing lower pole right renal stone.  03/2015 urol: stable nonobstructing renal calc by CT 12/2014 and KUB 03/2015.  .Marland KitchenObstructive sleep apnea    no cpap uses since weight loss of 50 pounds  . Presence of permanent cardiac pacemaker    [employer] . Squamous cell carcinoma of back   . Ventricular tachycardia ([facility]     Past Surgical History:  Procedure Laterality Date  . ATRIAL ABLATION SURGERY     flutter ablation 2002  . BREAST SURGERY Left 1990   "Tissue removed from breast"  . CARDIAC CATHETERIZATION    . carotid doppler  09/25/12   No signif stenosis  . COLONOSCOPY N/A 07/13/2012   Diverticula ascending colon.  Polyp x 1-recall 5 yrs (Dr. MWatt Climes Procedure: COLONOSCOPY;  Surgeon: MJeryl Columbia MD;  Location: [facility];  Service: Endoscopy;  Laterality: N/A; polypectomy x 1   . COLONOSCOPY WITH PROPOFOL N/A 08/18/2015   Procedure: COLONOSCOPY WITH PROPOFOL;  Surgeon: KMauri Pole MD;  Location: [facility];  Service: Endoscopy;   Laterality: N/A;  . ESOPHAGOGASTRODUODENOSCOPY N/A 07/13/2012   Procedure: ESOPHAGOGASTRODUODENOSCOPY (EGD);  Surgeon: MJeryl Columbia MD;  [facility];  Service: Endoscopy;  Laterality: N/A;  NORMAL  . EXTRACORPOREAL SHOCK WAVE LITHOTRIPSY    . HEMORRHOID BANDING    . ICD     a) Guidant Contak H170- SVirl Axe MD 07/25/06 (second device) b) Medtronic CRT-D  . INGUINAL HERNIA REPAIR Bilateral 1975  . LEAD REVISION Bilateral 12/26/2011   Procedure: LEAD REVISION;  Surgeon: GEvans Lance MD;  [facility];  Service: Cardiovascular;  Laterality: Bilateral;  . PFTs  02/2015   Mod restriction (interstitial), mod diffusion defect (Dr. YAnnamaria Boots  . precancer area keratsis removed  05/2015    leftf [place] and left arm and right arm and back    Social History   Socioeconomic History  . Marital status: Married  Spouse name: Not on file  . Number of children: 3  . Years of education: Not on file  . Highest education level: Not on file  Occupational History  . Occupation: Retired    Fish farm manager: RETIRED  Social Needs  . Financial resource strain: Not on file  . Food insecurity    Worry: Not on file    Inability: Not on file  . Transportation needs    Medical: Not on file    Non-medical: Not on file  Tobacco Use  . Smoking status: Former Smoker    Packs/day: 2.00    Years: 15.00    Pack years: 30.00    Types: Cigarettes    Quit date: 01/15/1980    Years since quitting: 38.9  . Smokeless tobacco: Never Used  Substance and Sexual Activity  . Alcohol use: No    Alcohol/week: 0.0 standard drinks  . Drug use: No  . Sexual activity: Not on file  Lifestyle  . Physical activity    Days per week: Not on file    Minutes per session: Not on file  . Stress: Not on file  Relationships  . Social Herbalist on phone: Not on file    Gets together: Not on file    Attends religious service: Not on file    Active member of club or organization: Not on file     Attends meetings of clubs or organizations: Not on file    Relationship status: Not on file  . Intimate partner violence    Fear of current or ex partner: Not on file    Emotionally abused: Not on file    Physically abused: Not on file    Forced sexual activity: Not on file  Other Topics Concern  . Not on file  Social History Narrative   Married.   Hx of cigarettes: quit in the 1980s.  No alc/drugs.    Family History  Problem Relation Age of Onset  . Heart disease Mother   . Other Mother        colon surgery for fistula  . Gallstones Mother   . Breast cancer Maternal Grandmother   . Coronary artery disease Father        family hx of  . Heart attack Father        4 stents/ pacemaker  . Nephrolithiasis Father   . Hypertension Sister   . Goiter Paternal Grandmother   . Nephrolithiasis Son   . Colon cancer Neg Hx     ROS: no fevers or chills, productive cough, hemoptysis, dysphasia, odynophagia, melena, hematochezia, dysuria, hematuria, rash, seizure activity, orthopnea, PND, pedal edema, claudication. Remaining systems are negative.  Physical Exam: Well-developed well-nourished in no acute distress.  Skin is warm and dry.  HEENT is normal.  Neck is supple.  Chest is clear to auscultation with normal expansion.  Cardiovascular exam is regular rate and rhythm.  Abdominal exam nontender or distended. No masses palpated. Extremities show no edema. neuro grossly intact  ECG-ventricular paced rhythm.  Personally reviewed  A/P  1 coronary artery disease-patient has not had chest pain.  Continue medical therapy with Crestor.  No aspirin given need for Xarelto.  2 paroxysmal atrial fibrillation-patient continues in sinus rhythm.  Continue Coreg for rate control if atrial fibrillation recurs.  Continue Xarelto.  3 ischemic cardiomyopathy-continue Entresto and carvedilol.  4 chronic systolic congestive heart failure-patient remains euvolemic on examination.  Continue Lasix  and eplerenone at present dose.  Continue  fluid restriction and low-sodium diet.  5 hypertension-blood pressure is controlled.  Continue present medications and follow.  6 history of ventricular tachycardia-we will continue present dose of carvedilol and mexiletine.  Amiodarone was discontinued previously due to potential lung toxicity though not definite.  7 biventricular ICD-followed by electrophysiology.  8 hyperlipidemia-continue Crestor.   Kirk Ruths, MD

## 2018-12-02 ENCOUNTER — Ambulatory Visit (INDEPENDENT_AMBULATORY_CARE_PROVIDER_SITE_OTHER): Payer: Medicare Other | Admitting: Cardiology

## 2018-12-02 ENCOUNTER — Encounter: Payer: Self-pay | Admitting: Cardiology

## 2018-12-02 ENCOUNTER — Other Ambulatory Visit: Payer: Self-pay

## 2018-12-02 VITALS — BP 103/63 | HR 60 | Ht 70.0 in | Wt 220.4 lb

## 2018-12-02 DIAGNOSIS — I480 Paroxysmal atrial fibrillation: Secondary | ICD-10-CM

## 2018-12-02 DIAGNOSIS — I10 Essential (primary) hypertension: Secondary | ICD-10-CM

## 2018-12-02 DIAGNOSIS — E7800 Pure hypercholesterolemia, unspecified: Secondary | ICD-10-CM

## 2018-12-02 DIAGNOSIS — I255 Ischemic cardiomyopathy: Secondary | ICD-10-CM | POA: Diagnosis not present

## 2018-12-02 DIAGNOSIS — I2510 Atherosclerotic heart disease of native coronary artery without angina pectoris: Secondary | ICD-10-CM | POA: Diagnosis not present

## 2018-12-02 NOTE — Patient Instructions (Signed)
Medication Instructions:  NO CHANGE *If you need a refill on your cardiac medications before your next appointment, please call your pharmacy*  Lab Work: If you have labs (blood work) drawn today and your tests are completely normal, you will receive your results only by: [name] MyChart Message (if you have MyChart) OR . A paper copy in the mail If you have any lab test that is abnormal or we need to change your treatment, we will call you to review the results.  Follow-Up: At [facility], you and your health needs are our priority.  As part of our continuing mission to provide you with exceptional heart care, we have created designated Provider Care Teams.  These Care Teams include your primary Cardiologist (physician) and Advanced Practice Providers (APPs -  Physician Assistants and Nurse Practitioners) who all work together to provide you with the care you need, when you need it.  Your next appointment:   6 month(s)  The format for your next appointment:   In Person  Provider:   Kirk Ruths, MD

## 2018-12-09 ENCOUNTER — Other Ambulatory Visit: Payer: Self-pay | Admitting: Emergency Medicine

## 2018-12-14 ENCOUNTER — Other Ambulatory Visit: Payer: Self-pay | Admitting: Emergency Medicine

## 2018-12-17 ENCOUNTER — Other Ambulatory Visit: Payer: Self-pay | Admitting: Emergency Medicine

## 2018-12-17 ENCOUNTER — Telehealth: Payer: Self-pay | Admitting: Emergency Medicine

## 2018-12-17 MED ORDER — IPRATROPIUM BROMIDE 0.06 % NA SOLN: NASAL | 3 refills | Status: DC

## 2018-12-17 NOTE — Telephone Encounter (Signed)
Spoke with pharmacy and authorized refill on atrovent NS  Nothing further needed

## 2018-12-30 ENCOUNTER — Ambulatory Visit (INDEPENDENT_AMBULATORY_CARE_PROVIDER_SITE_OTHER): Payer: Medicare Other | Admitting: *Deleted

## 2018-12-30 DIAGNOSIS — Z95810 Presence of automatic (implantable) cardiac defibrillator: Secondary | ICD-10-CM | POA: Diagnosis not present

## 2018-12-30 LAB — CUP PACEART REMOTE DEVICE CHECK
Battery Remaining Longevity: 12 mo
Battery Remaining Percentage: 24 %
Brady Statistic RA Percent Paced: 96 %
Brady Statistic RV Percent Paced: 100 %
Date Time Interrogation Session: 20201216030100
HighPow Impedance: 49 Ohm
Implantable Lead Implant Date: 20020527
Implantable Lead Implant Date: 20020527
Implantable Lead Implant Date: 20131212
Implantable Lead Location: 753858
Implantable Lead Location: 753859
Implantable Lead Location: 753860
Implantable Lead Model: 148
Implantable Lead Model: 4194
Implantable Lead Model: 6940
Implantable Lead Serial Number: 120244
Implantable Pulse Generator Implant Date: 20111207
Lead Channel Impedance Value: 546 Ohm
Lead Channel Impedance Value: 594 Ohm
Lead Channel Impedance Value: 884 Ohm
Lead Channel Pacing Threshold Amplitude: 0.7 V
Lead Channel Pacing Threshold Amplitude: 1 V
Lead Channel Pacing Threshold Amplitude: 1.9 V
Lead Channel Pacing Threshold Pulse Width: 0.3 ms
Lead Channel Pacing Threshold Pulse Width: 0.4 ms
Lead Channel Pacing Threshold Pulse Width: 1 ms
Lead Channel Setting Pacing Amplitude: 2 V
Lead Channel Setting Pacing Amplitude: 2.5 V
Lead Channel Setting Pacing Amplitude: 2.5 V
Lead Channel Setting Pacing Pulse Width: 0.4 ms
Lead Channel Setting Pacing Pulse Width: 1 ms
Lead Channel Setting Sensing Sensitivity: 0.5 mV
Lead Channel Setting Sensing Sensitivity: 1 mV
Pulse Gen Serial Number: 492371

## 2019-01-20 NOTE — Progress Notes (Signed)
ICD remote 

## 2019-01-29 ENCOUNTER — Other Ambulatory Visit: Payer: Self-pay | Admitting: *Deleted

## 2019-01-29 MED ORDER — RIVAROXABAN 20 MG PO TABS: 20.0000 mg | ORAL_TABLET | Freq: Every day | ORAL | 1 refills | Status: DC

## 2019-02-01 ENCOUNTER — Ambulatory Visit: Payer: Medicare Other

## 2019-02-02 ENCOUNTER — Ambulatory Visit: Payer: Medicare Other | Attending: Internal Medicine

## 2019-02-02 DIAGNOSIS — Z23 Encounter for immunization: Secondary | ICD-10-CM | POA: Insufficient documentation

## 2019-02-02 NOTE — Progress Notes (Signed)
   Covid-19 Vaccination Clinic  Name:  [name]    MRN: [id number] DOB: [birth year]  02/02/2019  [name] was observed post Covid-19 immunization for 15 minutes without incidence. He was provided with Vaccine Information Sheet and instruction to access the V-Safe system.   [name] was instructed to call 911 with any severe reactions post vaccine: [name] Difficulty breathing  . Swelling of your face and throat  . A fast heartbeat  . A bad rash all over your body  . Dizziness and weakness    Immunizations Administered    Name Date Dose VIS Date Route   Pfizer COVID-19 Vaccine 02/02/2019  3:24 PM 0.3 mL 12/25/2018 Intramuscular   Manufacturer: [place]   Lot: [id number]   [place]: [id number]

## 2019-02-11 ENCOUNTER — Other Ambulatory Visit: Payer: Self-pay | Admitting: General Practice

## 2019-02-11 ENCOUNTER — Other Ambulatory Visit: Payer: Self-pay | Admitting: Cardiology

## 2019-02-11 DIAGNOSIS — I5022 Chronic systolic (congestive) heart failure: Secondary | ICD-10-CM

## 2019-02-11 MED ORDER — EZETIMIBE 10 MG PO TABS: ORAL_TABLET | ORAL | 0 refills | Status: DC

## 2019-02-22 ENCOUNTER — Ambulatory Visit: Payer: Medicare Other | Attending: Internal Medicine

## 2019-02-22 DIAGNOSIS — Z23 Encounter for immunization: Secondary | ICD-10-CM

## 2019-02-22 NOTE — Progress Notes (Signed)
   Covid-19 Vaccination Clinic  Name:  [name]    MRN: [id number] DOB: 1940/03/27  02/22/2019  [name] was observed post Covid-19 immunization for 15 minutes without incidence. He was provided with Vaccine Information Sheet and instruction to access the V-Safe system.   [name] was instructed to call 911 with any severe reactions post vaccine: [name] Difficulty breathing  . Swelling of your face and throat  . A fast heartbeat  . A bad rash all over your body  . Dizziness and weakness    Immunizations Administered    Name Date Dose VIS Date Route   Pfizer COVID-19 Vaccine 02/22/2019  5:37 PM 0.3 mL 12/25/2018 Intramuscular   Manufacturer: [place]   Lot: [id number]   [place]: [address]-5

## 2019-03-09 DIAGNOSIS — L308 Other specified dermatitis: Secondary | ICD-10-CM | POA: Diagnosis not present

## 2019-03-09 DIAGNOSIS — L570 Actinic keratosis: Secondary | ICD-10-CM | POA: Diagnosis not present

## 2019-03-09 DIAGNOSIS — D692 Other nonthrombocytopenic purpura: Secondary | ICD-10-CM | POA: Diagnosis not present

## 2019-03-09 DIAGNOSIS — L821 Other seborrheic keratosis: Secondary | ICD-10-CM | POA: Diagnosis not present

## 2019-03-09 DIAGNOSIS — D1801 Hemangioma of skin and subcutaneous tissue: Secondary | ICD-10-CM | POA: Diagnosis not present

## 2019-03-30 ENCOUNTER — Telehealth: Payer: Self-pay | Admitting: Emergency Medicine

## 2019-03-30 ENCOUNTER — Other Ambulatory Visit: Payer: Self-pay | Admitting: *Deleted

## 2019-03-30 DIAGNOSIS — J849 Interstitial pulmonary disease, unspecified: Secondary | ICD-10-CM

## 2019-03-30 MED ORDER — BUDESONIDE-FORMOTEROL FUMARATE 160-4.5 MCG/ACT IN AERO: 2.0000 | INHALATION_SPRAY | Freq: Two times a day (BID) | RESPIRATORY_TRACT | 0 refills | Status: DC

## 2019-03-30 NOTE — Telephone Encounter (Signed)
Spoke with pt, he states he would like to get an xray before his appt on 04/23/2019. He would like to get the xray done in [facility]. His phone was breaking up and I could not make out what he was saying. I advised him to call back to confirm where he wanted to get the xray. In the meanwhile, Dr. Lamonte Sakai are you ok to place order for chest xray? Please advise.   [place]

## 2019-03-31 ENCOUNTER — Ambulatory Visit (INDEPENDENT_AMBULATORY_CARE_PROVIDER_SITE_OTHER): Payer: Medicare Other | Admitting: *Deleted

## 2019-03-31 DIAGNOSIS — Z95810 Presence of automatic (implantable) cardiac defibrillator: Secondary | ICD-10-CM

## 2019-03-31 LAB — CUP PACEART REMOTE DEVICE CHECK
Battery Remaining Longevity: 12 mo
Battery Remaining Percentage: 20 %
Brady Statistic RA Percent Paced: 95 %
Brady Statistic RV Percent Paced: 99 %
Date Time Interrogation Session: 20210317031400
HighPow Impedance: 49 Ohm
Implantable Lead Implant Date: 20020527
Implantable Lead Implant Date: 20020527
Implantable Lead Implant Date: 20131212
Implantable Lead Location: 753858
Implantable Lead Location: 753859
Implantable Lead Location: 753860
Implantable Lead Model: 148
Implantable Lead Model: 4194
Implantable Lead Model: 6940
Implantable Lead Serial Number: 120244
Implantable Pulse Generator Implant Date: 20111207
Lead Channel Impedance Value: 527 Ohm
Lead Channel Impedance Value: 603 Ohm
Lead Channel Impedance Value: 873 Ohm
Lead Channel Pacing Threshold Amplitude: 0.7 V
Lead Channel Pacing Threshold Amplitude: 1 V
Lead Channel Pacing Threshold Amplitude: 1.9 V
Lead Channel Pacing Threshold Pulse Width: 0.3 ms
Lead Channel Pacing Threshold Pulse Width: 0.4 ms
Lead Channel Pacing Threshold Pulse Width: 1 ms
Lead Channel Setting Pacing Amplitude: 2 V
Lead Channel Setting Pacing Amplitude: 2.5 V
Lead Channel Setting Pacing Amplitude: 2.5 V
Lead Channel Setting Pacing Pulse Width: 0.4 ms
Lead Channel Setting Pacing Pulse Width: 1 ms
Lead Channel Setting Sensing Sensitivity: 0.5 mV
Lead Channel Setting Sensing Sensitivity: 1 mV
Pulse Gen Serial Number: 492371

## 2019-03-31 NOTE — Telephone Encounter (Signed)
Patient is returning phone call. Patient phone number is [phone].

## 2019-03-31 NOTE — Telephone Encounter (Signed)
Spoke with pt, advised him that Dr. Lamonte Sakai agreed to order a chest x ray before his appt. I advised him that the order is in the chart and he could go to [facility] as requested. Pt understood and nothing further is needed.

## 2019-03-31 NOTE — Telephone Encounter (Signed)
Agree with ordering a CXR for ILD. Need to make sure I will be able to see a copy at the [facility]

## 2019-03-31 NOTE — Telephone Encounter (Signed)
ATC Patient. Left message for Patient to call back to give CXR  location. CXR at [place] order pending.

## 2019-04-01 NOTE — Progress Notes (Signed)
ICD Remote

## 2019-04-08 ENCOUNTER — Other Ambulatory Visit: Payer: Self-pay | Admitting: Cardiology

## 2019-04-08 NOTE — Telephone Encounter (Signed)
*  STAT* If patient is at the pharmacy, call can be transferred to refill team.   1. Which medications need to be refilled? (please list name of each medication and dose if known) furosemide (LASIX) 40 MG tablet  2. Which pharmacy/location (including street and city if local pharmacy) is medication to be sent to? [id number] - [place], [place] - [address] [place] [address] [place] [address]  3. Do they need a 30 day or 90 day supply? 90 day  Patient is out of medication.

## 2019-04-09 MED ORDER — FUROSEMIDE 40 MG PO TABS: ORAL_TABLET | ORAL | 3 refills | Status: DC

## 2019-04-14 ENCOUNTER — Other Ambulatory Visit: Payer: Self-pay | Admitting: Cardiology

## 2019-04-14 DIAGNOSIS — I429 Cardiomyopathy, unspecified: Secondary | ICD-10-CM

## 2019-04-14 DIAGNOSIS — Z95810 Presence of automatic (implantable) cardiac defibrillator: Secondary | ICD-10-CM

## 2019-04-14 DIAGNOSIS — I480 Paroxysmal atrial fibrillation: Secondary | ICD-10-CM

## 2019-04-14 DIAGNOSIS — I059 Rheumatic mitral valve disease, unspecified: Secondary | ICD-10-CM

## 2019-04-15 ENCOUNTER — Other Ambulatory Visit: Payer: Self-pay

## 2019-04-15 ENCOUNTER — Ambulatory Visit (HOSPITAL_BASED_OUTPATIENT_CLINIC_OR_DEPARTMENT_OTHER)
Admission: RE | Admit: 2019-04-15 | Discharge: 2019-04-15 | Disposition: A | Discharge status: Home / Self Care | Payer: Medicare Other | Source: Ambulatory Visit | Attending: Emergency Medicine | Admitting: Emergency Medicine

## 2019-04-15 ENCOUNTER — Telehealth: Payer: Self-pay | Admitting: Family Medicine

## 2019-04-15 DIAGNOSIS — J849 Interstitial pulmonary disease, unspecified: Secondary | ICD-10-CM | POA: Insufficient documentation

## 2019-04-15 DIAGNOSIS — J449 Chronic obstructive pulmonary disease, unspecified: Secondary | ICD-10-CM | POA: Diagnosis not present

## 2019-04-15 DIAGNOSIS — J8410 Pulmonary fibrosis, unspecified: Secondary | ICD-10-CM | POA: Diagnosis not present

## 2019-04-15 NOTE — Progress Notes (Signed)
  Chronic Care Management   Outreach Note  04/15/2019 Name: [name] MRN: [id number] DOB: Oct 31, 1940  Referred by: [name], MD Reason for referral : No chief complaint on file.   An unsuccessful telephone outreach was attempted today. The patient was referred to the pharmacist for assistance with care management and care coordination.   Follow Up Plan:   Earney [place] Upstream Scheduler

## 2019-04-19 ENCOUNTER — Encounter: Payer: Self-pay | Admitting: Internal Medicine

## 2019-04-19 ENCOUNTER — Ambulatory Visit (INDEPENDENT_AMBULATORY_CARE_PROVIDER_SITE_OTHER): Payer: Medicare Other | Admitting: Internal Medicine

## 2019-04-19 ENCOUNTER — Other Ambulatory Visit: Payer: Self-pay

## 2019-04-19 VITALS — BP 112/72 | HR 60 | Ht 70.0 in | Wt 214.8 lb

## 2019-04-19 DIAGNOSIS — R269 Unspecified abnormalities of gait and mobility: Secondary | ICD-10-CM

## 2019-04-19 DIAGNOSIS — I4891 Unspecified atrial fibrillation: Secondary | ICD-10-CM | POA: Diagnosis not present

## 2019-04-19 DIAGNOSIS — Z95810 Presence of automatic (implantable) cardiac defibrillator: Secondary | ICD-10-CM

## 2019-04-19 DIAGNOSIS — I472 Ventricular tachycardia: Secondary | ICD-10-CM | POA: Diagnosis not present

## 2019-04-19 DIAGNOSIS — I255 Ischemic cardiomyopathy: Secondary | ICD-10-CM | POA: Diagnosis not present

## 2019-04-19 DIAGNOSIS — I4729 Other ventricular tachycardia: Secondary | ICD-10-CM

## 2019-04-19 NOTE — Patient Instructions (Addendum)
Medication Instructions:  Your physician recommends that you continue on your current medications as directed. Please refer to the Current Medication list given to you today.  Labwork: None ordered.  Testing/Procedures: None ordered.  Follow-Up: Your physician wants you to follow-up in: 12 months with Dr Caryl Comes. You will receive a reminder letter in the mail two months in advance. If you don't receive a letter, please call our office to schedule the follow-up appointment.  Remote monitoring is used to monitor your Pacemaker of ICD from home. This monitoring reduces the number of office visits required to check your device to one time per year. It allows [place] to keep an eye on the functioning of your device to ensure it is working properly.   Any Other Special Instructions Will Be Listed Below (If Applicable).  You have been referred to Dr Tat with [facility]  If you need a refill on your cardiac medications before your next appointment, please call your pharmacy.

## 2019-04-19 NOTE — Progress Notes (Signed)
Patient Care Team: [name], MD as PCP - General (Family Medicine) Festus Aloe, MD as Consulting Physician (Urology) Michael Boston, MD as Consulting Physician (General Surgery) Stanford Breed Denice Bors, MD as Consulting Physician (Cardiology) Jodi Marble, MD as Consulting Physician (Otolaryngology) Deboraha Sprang, MD as Consulting Physician (Cardiology) Luberta Mutter, MD as Consulting Physician (Ophthalmology) Lamonte Sakai Rose Fillers, MD as Consulting Physician (Pulmonary Disease) Mauri Pole, MD as Consulting Physician (Gastroenterology)   HPI  [name] is a 79 y.o. male   in the setting of ischemic heart disease and previously implanted CRT-D with subsequent left ventricular lead macro dislodgment. His symptoms however been quite stable. When he saw Dr. Lovena Le concerning lead extraction and lead implantation he was doing as well as he had for some time and it was decided not to pursue it then. Ultimately this was done  2013  He also has ventricular tachycardia for which he took amiodarone.   required adjunctive mexiletine. LFTs were abnormal and his Crestor was stopped.  Subsequently normalized even on amiodarone.  Amiodarone stopped spring 2019 because of issues of lung toxicity   He also has atrial fibrillation and is taking Xarelto  No bleeding  He has been well managed with guideline directed therapy currently on Entresto eplernonoe and carvedilol.     Pulm notes and CT report reviewed   Repeat testing anticipated 3/19.   CT 3/18 demonstrated emphysema with chronic interstitial changes but no progressive findings PFTs and DLCO prompted the discontinuation of amiodarone  No interval ICD discharges; no palpitations.   No chest pain except following a fall; no shortness of breath no edema.    He has had a problem with falling.  This is included falling backwards off of a shovel, falling while walking downhill, no orthostatic lightheadedness.  Also  describes himself as having old man shuffled gait      DATE TEST EF   10/08 Cath    20 % T RCA  12/13 Cath EF<20% T RCA   1/17 Echo   EF 20-25 %             Date Cr K Hgb TSH PLCO LFTs LDL  2008     80%    11/17     1.91 56%(3/17) 31    6/18 0.95 4.0 15.2 2.19  30 162  5/19 0.95 4.0         10/20 0.87 4.1 14.2 1.54  18      Thromboembolic risk factors ( age -22, HTN-1, Vasc disease -1, CHF-1) for a CHADSVASc Score of  >= 5    Past Medical History:  Diagnosis Date  . Adenomatous colon polyp 2009; 06/2012   Colonoscopy 07/2007---Dr. Barron Schmid GI--Repeat 06/2012 showed one tubular adenoma w/out high grade dysplasia.  [name] AICD (automatic cardioverter/defibrillator) present    [employer]  . Atrial fibrillation ([place])   . Bilateral sensorineural hearing loss   . BPH (benign prostatic hypertrophy)    PSAs ok (followed by urologist): no LUTS  . CAD (coronary artery disease)    PTCA RCA 1997 (Inf/post MI);  . CHF (congestive heart failure) ([place])   . Chronic constipation   . Chronic rhinitis    ?vasomotor (Dr. Erik Obey)  . COPD (chronic obstructive pulmonary disease) ([place])   . Diverticulosis of colon 2005   Noted on Colonoscopies in 2005 and 2009 and on CT 2010  . Gallstones 06/2014   No evidence of cholecystitis on abd u/s 06/2014  .  GERD (gastroesophageal reflux disease)   . Hepatic steatosis 2010; 2016   Noted on CT abd 2010 and on u/s abd 04/2009 (transaminasemia) and u/s 06/2014.  [name] Hyperlipidemia   . Hypertension   . Ischemic cardiomyopathy   . Microscopic hematuria    Cysto neg 02/2009 except for BPH-likely has microhem from his renal stone dz  . Nephrolithiasis    No distal stones on CT 2010; stable 8 mm RLP stone, 40m LMP stone 10/2013 plain film.  Abd u/s 06/2014: 9 mm nonobstructing lower pole right renal stone.  03/2015 urol: stable nonobstructing renal calc by CT 12/2014 and KUB 03/2015.  .Marland KitchenObstructive sleep apnea    no cpap uses since weight loss of 50 pounds   . Presence of permanent cardiac pacemaker    [employer] . Squamous cell carcinoma of back   . Ventricular tachycardia ([facility]     Past Surgical History:  Procedure Laterality Date  . ATRIAL ABLATION SURGERY     flutter ablation 2002  . BREAST SURGERY Left 1990   "Tissue removed from breast"  . CARDIAC CATHETERIZATION    . carotid doppler  09/25/12   No signif stenosis  . COLONOSCOPY N/A 07/13/2012   Diverticula ascending colon.  Polyp x 1-recall 5 yrs (Dr. MWatt Climes Procedure: COLONOSCOPY;  Surgeon: MJeryl Columbia MD;  Location: [facility];  Service: Endoscopy;  Laterality: N/A; polypectomy x 1   . COLONOSCOPY WITH PROPOFOL N/A 08/18/2015   Procedure: COLONOSCOPY WITH PROPOFOL;  Surgeon: KMauri Pole MD;  Location: [facility] ENDOSCOPY;  Service: Endoscopy;  Laterality: N/A;  . ESOPHAGOGASTRODUODENOSCOPY N/A 07/13/2012   Procedure: ESOPHAGOGASTRODUODENOSCOPY (EGD);  Surgeon: MJeryl Columbia MD;  [facility];  Service: Endoscopy;  Laterality: N/A;  NORMAL  . EXTRACORPOREAL SHOCK WAVE LITHOTRIPSY    . HEMORRHOID BANDING    . ICD     a) Guidant Contak H170- SVirl Axe MD 07/25/06 (second device) b) Medtronic CRT-D  . INGUINAL HERNIA REPAIR Bilateral 1975  . LEAD REVISION Bilateral 12/26/2011   Procedure: LEAD REVISION;  Surgeon: GEvans Lance MD;  [facility];  Service: Cardiovascular;  Laterality: Bilateral;  . PFTs  02/2015   Mod restriction (interstitial), mod diffusion defect (Dr. YAnnamaria Boots  . precancer area keratsis removed  05/2015    leftf orehead and left arm and right arm and back    Current Outpatient Medications  Medication Sig Dispense Refill  . antiseptic oral rinse (BIOTENE) LIQD 15 mLs by Mouth Rinse route as needed for dry mouth (TID).    .Marland Kitchenbudesonide-formoterol (SYMBICORT) 160-4.5 MCG/ACT inhaler Inhale 2 puffs into the lungs 2 (two) times daily. MUST HAVE OV FOR FURTHER REFILLS 1 Inhaler 0  . carvedilol (COREG) 12.5 MG tablet Take 1 tablet (12.5  mg total) by mouth 2 (two) times daily with a meal. 180 tablet 2  . cholecalciferol (VITAMIN D) 1000 units tablet Take 1,000 Units by mouth daily.    .Marland KitchenENTRESTO 24-26 MG TAKE 1 TABLET BY MOUTH TWICE DAILY 180 tablet 2  . eplerenone (INSPRA) 25 MG tablet Take 1 tablet (25 mg total) by mouth daily. 30 tablet 11  . ezetimibe (ZETIA) 10 MG tablet TAKE 1 TABLET(10 MG) BY MOUTH DAILY 90 tablet 0  . fluticasone (FLONASE) 50 MCG/ACT nasal spray SHAKE LIQUID AND USE 2 SPRAYS IN EACH NOSTRIL DAILY 16 g 5  . furosemide (LASIX) 40 MG tablet TAKE 2 TABLETS BY MOUTH EVERY DAY 90 tablet 3  . ipratropium (ATROVENT) 0.06 %  nasal spray USE 2 SPRAYS IN EACH NOSTRIL THREE TIMES DAILY 15 mL 2  . loratadine (CLARITIN) 10 MG tablet Take 10 mg by mouth daily.    . Magnesium 250 MG TABS Take 1 tablet by mouth daily.    [name] mexiletine (MEXITIL) 150 MG capsule TAKE 2 CAPSULES(300 MG) BY MOUTH TWICE DAILY 360 capsule 2  . Multiple Vitamin (MULTIVITAMIN WITH MINERALS) TABS Take 1 tablet by mouth daily.    . nitroGLYCERIN (NITROSTAT) 0.4 MG SL tablet DISSOLVE 1 TABLET UNDER THE TONGUE EVERY 5 MINUTES AS NEEDED FOR CHEST PAIN 25 tablet 5  . polyvinyl alcohol-povidone (REFRESH) 1.4-0.6 % ophthalmic solution Place 1-2 drops into both eyes as needed (dry eyes).     . potassium chloride SA (K-DUR,KLOR-CON) 20 MEQ tablet TAKE 1 TABLET(20 MEQ) BY MOUTH DAILY 90 tablet 3  . Probiotic Product (PROBIOTIC DAILY PO) Take 1 tablet by mouth daily.    . rivaroxaban (XARELTO) 20 MG TABS tablet Take 1 tablet (20 mg total) by mouth daily with supper. 90 tablet 1  . rosuvastatin (CRESTOR) 10 MG tablet Take 1 tablet (10 mg total) by mouth daily. 90 tablet 0  . VENTOLIN HFA 108 (90 Base) MCG/ACT inhaler INHALE 1 TO 2 PUFFS INTO THE LUNGS EVERY 4 HOURS AS NEEDED FOR WHEEZING OR SHORTNESS OF BREATH 18 g 5  . Wheat Dextrin (BENEFIBER PO) Take 2 Scoops by mouth 2 (two) times daily.     No current facility-administered medications for this visit.     Allergies  Allergen Reactions  . Sulfa Antibiotics Rash  . Crestor [Rosuvastatin] Rash    Review of Systems negative except from HPI and PMH  Physical Exam BP 112/72 (BP Location: Right Arm, Patient Position: Sitting, Cuff Size: Normal)   Pulse 60   Ht _0  (1.778 m)   Wt 214 lb 12.8 oz (97.4 kg)   SpO2 96%   BMI 30.82 kg/m  Well developed and well nourished in no acute distress HENT normal Neck supple with JVP-flat Clear Device pocket well healed; without hematoma or erythema.  There is no tethering  Regular rate and rhythm, no  murmur Abd-soft with active BS No Clubbing cyanosis * edema Skin-warm and dry A & Oriented  Grossly normal sensory and motor function Romberg negative.  Unable to walk toe  ECG *AV pacing with a biventricular pacing configuration    Assessment and  Plan   Ventricular tachycardia  Ischemic and nonischemic cardiomyopathy  Congestive heart failure-chronic-systolic  CRT-D-St. Jude's   The patient's device was interrogated.  The information was reviewed. No changes were made in the programming.     COPD pulm fibrosis  Atrial fibrillation  Gait instability  Without symptoms of ischemia  No intercurrent Ventricular tachycardia  Euvolemic continue current meds  Gait instability is of some concern.  We will arrange for a consultation with Dr. Carles Collet over [facility] neurology with her expertise in movement disorders.

## 2019-04-21 ENCOUNTER — Other Ambulatory Visit: Payer: Self-pay | Admitting: *Deleted

## 2019-04-23 ENCOUNTER — Other Ambulatory Visit: Payer: Self-pay

## 2019-04-23 ENCOUNTER — Encounter: Payer: Self-pay | Admitting: Emergency Medicine

## 2019-04-23 ENCOUNTER — Ambulatory Visit (INDEPENDENT_AMBULATORY_CARE_PROVIDER_SITE_OTHER): Payer: Medicare Other | Admitting: Emergency Medicine

## 2019-04-23 DIAGNOSIS — J449 Chronic obstructive pulmonary disease, unspecified: Secondary | ICD-10-CM | POA: Diagnosis not present

## 2019-04-23 DIAGNOSIS — J849 Interstitial pulmonary disease, unspecified: Secondary | ICD-10-CM | POA: Diagnosis not present

## 2019-04-23 DIAGNOSIS — J310 Chronic rhinitis: Secondary | ICD-10-CM | POA: Diagnosis not present

## 2019-04-23 DIAGNOSIS — I255 Ischemic cardiomyopathy: Secondary | ICD-10-CM

## 2019-04-23 MED ORDER — IPRATROPIUM BROMIDE 0.06 % NA SOLN: NASAL | 12 refills | Status: DC

## 2019-04-23 MED ORDER — FLUTICASONE PROPIONATE 50 MCG/ACT NA SUSP: NASAL | 12 refills | Status: DC

## 2019-04-23 NOTE — Assessment & Plan Note (Signed)
Stable clinically and on chest x-ray.  I believe we can defer repeat PFT and CT chest until next year especially since he is off amiodarone.  Plan to perform next February.

## 2019-04-23 NOTE — Patient Instructions (Addendum)
Try increasing your flonase to 2 sprays each nostril twice a day You can use your ipratropium nasal spray, 2 sprays each nostril up to 3 times a day if you need it for nasal drainage.  Continue your Symbicort, 2 puffs twice a day. Rinse and gargle after using. We will work on a prior-authorization for this Keep albuterol available to use 2 puffs up to every 4 hours if needed for shortness of breath, chest tightness, wheezing.  Your CXR from 04/15/19 is stable.  Next February 2022 we will repeat your CT chest and your PFT's Follow with Dr. Lamonte Sakai in 12 months or sooner if you have any problems.

## 2019-04-23 NOTE — Progress Notes (Signed)
Subjective:    Patient ID: [name], male    DOB: October 18, 1940, 79 y.o.   MRN: [id number]  HPI      ROV 02/19/18 --79 year old man who follows up today for mixed obstruction and restriction, COPD as well as stable bilateral lower lobe interstitial disease.  We questioned possible amiodarone toxicity and this was stopped.  He had a repeat high-resolution CT scan of the chest on 02/17/2018 that I reviewed.  This shows some mild emphysematous change irregular peripheral interstitial disease with some mild associated bronchiectasis, no significant honeycomb change.  Unchanged compared with his prior 1 year ago.  Pulmonary function testing done on 02/09/2018 shows mild mixed restriction and obstruction, overall stable compared with his prior 1 year ago.        ROV 04/23/19 --[name] is 79 and has a history of mixed obstruction and restriction on pulmonary function testing, COPD as well as stable bilateral lower lobe interstitial disease.  His last CT scan was 02/17/2018 (stable).  He was previously on amiodarone, this has been stopped for several years.  Currently managed on Symbicort, is using albuterol very rarely He reports that he is having some imbalance, feels unstable w ambulation, has had some falls.  He is having more cough, mucous production over the last several months. He has a lot of nasal gtt - uses Atrovent NS with improvement. He is on flonase daily, loratadine.   Chest x-ray from 04/15/2019 reviewed by me, shows persistent interstitial markings particularly in the right midlung. No significant change or increase noted when comparing with prior films.                                                                                                   Review of Systems As per HPI      Objective:   Physical Exam Vitals:   04/23/19 1444  BP: 104/68  Pulse: 67  SpO2: 95%  Weight: 216 lb (98 kg)   Gen: Pleasant, well-nourished, in no distress,  normal affect  ENT: No lesions,  mouth  clear,  oropharynx clear, no postnasal drip  Neck: No JVD, no stridor  Lungs: No use of accessory muscles, right basilar inspiratory crackles, no wheezing  Cardiovascular: RRR, heart sounds normal, no murmur or gallops, no peripheral edema  Musculoskeletal: No deformities, no cyanosis or clubbing  Neuro: alert, non focal  Skin: Warm, no lesions or rashes      Assessment & Plan:  Interstitial lung disease (HCC) Stable clinically and on chest x-ray.  I believe we can defer repeat PFT and CT chest until next year especially since he is off amiodarone.  Plan to perform next February.  COPD (chronic obstructive pulmonary disease) (HCC) Continue Symbicort as ordered.  We will perform a prior authorization so that he can get it filled.  Albuterol as needed.  We will try to control his rhinitis more effectively as active drainage is likely making his COPD a little harder to manage.  Chronic rhinitis Coughing more with increased nasal drainage.  I will increase his Flonase to twice a day,  continue his ipratropium nasal spray as he is taking it.  Continue loratadine.  Baltazar Apo, MD, PhD 04/23/2019, 3:11 PM [facility] [phone] or if no answer [phone]

## 2019-04-23 NOTE — Assessment & Plan Note (Signed)
Coughing more with increased nasal drainage.  I will increase his Flonase to twice a day, continue his ipratropium nasal spray as he is taking it.  Continue loratadine.

## 2019-04-23 NOTE — Assessment & Plan Note (Signed)
Continue Symbicort as ordered.  We will perform a prior authorization so that he can get it filled.  Albuterol as needed.  We will try to control his rhinitis more effectively as active drainage is likely making his COPD a little harder to manage.

## 2019-04-29 ENCOUNTER — Ambulatory Visit: Payer: Medicare Other | Admitting: Family Medicine

## 2019-05-05 ENCOUNTER — Telehealth: Payer: Self-pay | Admitting: Family Medicine

## 2019-05-05 NOTE — Progress Notes (Signed)
Chronic Care Management   Outreach Note  05/05/2019 Name: [name] MRN: [id number] DOB: 1940-02-03  Referred by: [name], MD Reason for referral : No chief complaint on file.   Third unsuccessful telephone outreach was attempted today. The patient was referred to the pharmacist for assistance with care management and care coordination.   Follow Up Plan:   Earney Hamburg Upstream Scheduler

## 2019-05-06 ENCOUNTER — Telehealth: Payer: Self-pay | Admitting: Family Medicine

## 2019-05-06 ENCOUNTER — Other Ambulatory Visit: Payer: Self-pay | Admitting: *Deleted

## 2019-05-06 MED ORDER — BUDESONIDE-FORMOTEROL FUMARATE 160-4.5 MCG/ACT IN AERO: 2.0000 | INHALATION_SPRAY | Freq: Two times a day (BID) | RESPIRATORY_TRACT | 12 refills | Status: DC

## 2019-05-06 NOTE — Progress Notes (Signed)
  Chronic Care Management   Note  05/06/2019 Name: [name] MRN: [id number] DOB: Aug 07, 1940  [name] is a 79 y.o. year old male who is a primary care patient of [name], [name], MD. I reached out to [name] by phone today in response to a referral sent by [name] PCP, [name], MD.   [name] was given information about Chronic Care Management services today including:  1. CCM service includes personalized support from designated clinical staff supervised by his physician, including individualized plan of care and coordination with other care providers 2. 24/7 contact phone numbers for assistance for urgent and routine care needs. 3. Service will only be billed when office clinical staff spend 20 minutes or more in a month to coordinate care. 4. Only one practitioner may furnish and bill the service in a calendar month. 5. The patient may stop CCM services at any time (effective at the end of the month) by phone call to the office staff.   Patient agreed to services and verbal consent obtained.   Follow up plan:   Earney Hamburg Upstream Scheduler

## 2019-05-11 ENCOUNTER — Other Ambulatory Visit: Payer: Self-pay | Admitting: Emergency Medicine

## 2019-05-11 DIAGNOSIS — E7800 Pure hypercholesterolemia, unspecified: Secondary | ICD-10-CM

## 2019-05-11 MED ORDER — EZETIMIBE 10 MG PO TABS: ORAL_TABLET | ORAL | 0 refills | Status: DC

## 2019-05-17 ENCOUNTER — Encounter: Payer: Self-pay | Admitting: Family Medicine

## 2019-05-17 ENCOUNTER — Other Ambulatory Visit: Payer: Self-pay

## 2019-05-17 ENCOUNTER — Ambulatory Visit (INDEPENDENT_AMBULATORY_CARE_PROVIDER_SITE_OTHER): Payer: Medicare Other | Admitting: Family Medicine

## 2019-05-17 VITALS — BP 112/71 | HR 80 | Temp 97.9°F | Resp 16 | Ht 70.0 in | Wt 217.0 lb

## 2019-05-17 DIAGNOSIS — J441 Chronic obstructive pulmonary disease with (acute) exacerbation: Secondary | ICD-10-CM | POA: Diagnosis not present

## 2019-05-17 DIAGNOSIS — E7800 Pure hypercholesterolemia, unspecified: Secondary | ICD-10-CM | POA: Diagnosis not present

## 2019-05-17 DIAGNOSIS — I255 Ischemic cardiomyopathy: Secondary | ICD-10-CM | POA: Diagnosis not present

## 2019-05-17 DIAGNOSIS — R2689 Other abnormalities of gait and mobility: Secondary | ICD-10-CM

## 2019-05-17 DIAGNOSIS — I10 Essential (primary) hypertension: Secondary | ICD-10-CM

## 2019-05-17 LAB — CBC WITH DIFFERENTIAL/PLATELET
Basophils Absolute: 0.1 10*3/uL (ref 0.0–0.1)
Basophils Relative: 1.2 % (ref 0.0–3.0)
Eosinophils Absolute: 0.5 10*3/uL (ref 0.0–0.7)
Eosinophils Relative: 6.9 % — ABNORMAL HIGH (ref 0.0–5.0)
HCT: 42 % (ref 39.0–52.0)
Hemoglobin: 13.8 g/dL (ref 13.0–17.0)
Lymphocytes Relative: 28.7 % (ref 12.0–46.0)
Lymphs Abs: 2 10*3/uL (ref 0.7–4.0)
MCHC: 32.9 g/dL (ref 30.0–36.0)
MCV: 102 fl — ABNORMAL HIGH (ref 78.0–100.0)
Monocytes Absolute: 0.7 10*3/uL (ref 0.1–1.0)
Monocytes Relative: 9.7 % (ref 3.0–12.0)
Neutro Abs: 3.7 10*3/uL (ref 1.4–7.7)
Neutrophils Relative %: 53.5 % (ref 43.0–77.0)
Platelets: 288 10*3/uL (ref 150.0–400.0)
RBC: 4.11 Mil/uL — ABNORMAL LOW (ref 4.22–5.81)
RDW: 14 % (ref 11.5–15.5)
WBC: 6.9 10*3/uL (ref 4.0–10.5)

## 2019-05-17 LAB — LIPID PANEL
Cholesterol: 118 mg/dL (ref 0–200)
HDL: 30.2 mg/dL — ABNORMAL LOW (ref >39.00)
LDL Cholesterol: 67 mg/dL (ref 0–99)
NonHDL: 87.48
Total CHOL/HDL Ratio: 4
Triglycerides: 101 mg/dL (ref 0.0–149.0)
VLDL: 20.2 mg/dL (ref 0.0–40.0)

## 2019-05-17 LAB — HEPATIC FUNCTION PANEL
ALT: 19 U/L (ref 0–53)
AST: 21 U/L (ref 0–37)
Albumin: 3.8 g/dL (ref 3.5–5.2)
Alkaline Phosphatase: 59 U/L (ref 39–117)
Bilirubin, Direct: 0.1 mg/dL (ref 0.0–0.3)
Total Bilirubin: 0.7 mg/dL (ref 0.2–1.2)
Total Protein: 6.2 g/dL (ref 6.0–8.3)

## 2019-05-17 LAB — BASIC METABOLIC PANEL
BUN: 13 mg/dL (ref 6–23)
CO2: 26 mEq/L (ref 19–32)
Calcium: 8.8 mg/dL (ref 8.4–10.5)
Chloride: 103 mEq/L (ref 96–112)
Creatinine, Ser: 0.86 mg/dL (ref 0.40–1.50)
GFR: 86.09 mL/min (ref >60.00)
Glucose, Bld: 110 mg/dL — ABNORMAL HIGH (ref 70–99)
Potassium: 4.2 mEq/L (ref 3.5–5.1)
Sodium: 135 mEq/L (ref 135–145)

## 2019-05-17 LAB — TSH: TSH: 2.41 u[IU]/mL (ref 0.35–4.50)

## 2019-05-17 MED ORDER — AMOXICILLIN 875 MG PO TABS: 875.0000 mg | ORAL_TABLET | Freq: Two times a day (BID) | ORAL | 0 refills | Status: DC

## 2019-05-17 NOTE — Assessment & Plan Note (Signed)
Pt w/ bibasilar crackles after spending a lot of time outside.  His allergies have likely triggered a COPD exacerbation as he is using Albuterol multiple times daily.  Will add Amoxicillin in case of bacterial component.  Pt to call Pulmonary if sxs worsen. Pt expressed understanding and is in agreement w/ plan.

## 2019-05-17 NOTE — Assessment & Plan Note (Signed)
Chronic problem.  On both Zetia and Crestor.  Pt would like to stop his Zetia if possible.  Last LDL 69.  Ok to stop Zetia and monitor for change in lipids.

## 2019-05-17 NOTE — Patient Instructions (Addendum)
Follow up in 6 months to recheck blood pressure and cholesterol We'll notify you of your lab results and make any changes if needed STOP the Ezetimibe TAKE the Amoxicillin twice daily (w/ food) for probable early COPD exacerbation IF you again have issues w/ balance, please let me know Call with any questions or concerns Hang in there!

## 2019-05-17 NOTE — Progress Notes (Signed)
   Subjective:    Patient ID: [name], male    DOB: 1940-07-23, 79 y.o.   MRN: [id number]  HPI HTN- chronic problem, on Coreg 12.68m BID, Entresto 24/238mBID, Lasix 8060maily w/ good control.  Denies CP.  Has chest tightness w/ cough.  Denies HAs, visual changes.  Hyperlipidemia- chronic problem, on Zetia 21m27md Crestor 21mg12mly.  No abd pain, N/V.  Balance- he mentioned to Dr KleinCaryl Comest month) that he was 'old man walking (shuffling)' with falls.  Last fall was in Feb.  Reports that within the last 2 weeks his shuffling and balance issues have resolved.  Dr KleinCaryl Comesrecommended seeing Dr Tat but pt feels this is unnecessary at this time  Seasonal allergies vs COPD- 'I stay in the yard all the time'.  Noticed that this weekend he was developing nasal congestion, cough.  Due to underlying lung disease is worried about bronchitis/PNA.  Reports sxs improved yesterday when he stayed inside yesterday.  'I feel 80% better' but using Albuterol 2-3x/day for the last few days.  + SOB.   Review of Systems For ROS see HPI   This visit occurred during the SARS-CoV-2 public health emergency.  Safety protocols were in place, including screening questions prior to the visit, additional usage of staff PPE, and extensive cleaning of exam room while observing appropriate contact time as indicated for disinfecting solutions.       Objective:   Physical Exam Vitals reviewed.  Constitutional:      General: He is not in acute distress.    Appearance: He is well-developed. He is not ill-appearing.  HENT:     Head: Normocephalic and atraumatic.  Eyes:     Conjunctiva/sclera: Conjunctivae normal.     Pupils: Pupils are equal, round, and reactive to light.  Neck:     Thyroid: No thyromegaly.  Cardiovascular:     Rate and Rhythm: Normal rate.     Heart sounds: Normal heart sounds. No murmur.     Comments: Irregular S1/S2 Pulmonary:     Effort: Pulmonary effort is normal. No respiratory  distress.     Breath sounds: Normal breath sounds. No wheezing.     Comments: Bibasilar crackles Abdominal:     General: Bowel sounds are normal. There is no distension.     Palpations: Abdomen is soft.  Musculoskeletal:     Cervical back: Normal range of motion and neck supple.  Lymphadenopathy:     Cervical: No cervical adenopathy.  Skin:    General: Skin is warm and dry.  Neurological:     Mental Status: He is alert and oriented to person, place, and time.     Cranial Nerves: No cranial nerve deficit.     Motor: No weakness.     Gait: Gait normal.  Psychiatric:        Behavior: Behavior normal.           Assessment & Plan:  Balance issues- pt reports this has seemingly resolved and he has not fallen since Feb.  Was able to ambulate in office, get up on table, and rise from chair w/o difficulty.  Will hold on neuro referral at this time.  Pt expressed understanding and is in agreement w/ plan.

## 2019-05-17 NOTE — Assessment & Plan Note (Signed)
Chronic problem.  Well controlled.  Currently asymptomatic.  Will follow.

## 2019-05-18 ENCOUNTER — Other Ambulatory Visit (INDEPENDENT_AMBULATORY_CARE_PROVIDER_SITE_OTHER): Payer: Medicare Other

## 2019-05-18 ENCOUNTER — Other Ambulatory Visit: Payer: Medicare Other

## 2019-05-18 DIAGNOSIS — R7309 Other abnormal glucose: Secondary | ICD-10-CM

## 2019-05-18 LAB — HEMOGLOBIN A1C: Hgb A1c MFr Bld: 5.9 % (ref 4.6–6.5)

## 2019-05-25 ENCOUNTER — Other Ambulatory Visit: Payer: Self-pay

## 2019-05-25 MED ORDER — POTASSIUM CHLORIDE CRYS ER 20 MEQ PO TBCR: EXTENDED_RELEASE_TABLET | ORAL | 3 refills | Status: DC

## 2019-05-26 ENCOUNTER — Other Ambulatory Visit: Payer: Self-pay | Admitting: General Practice

## 2019-05-26 DIAGNOSIS — I10 Essential (primary) hypertension: Secondary | ICD-10-CM

## 2019-05-26 DIAGNOSIS — E7800 Pure hypercholesterolemia, unspecified: Secondary | ICD-10-CM

## 2019-05-26 DIAGNOSIS — I5022 Chronic systolic (congestive) heart failure: Secondary | ICD-10-CM

## 2019-05-27 ENCOUNTER — Other Ambulatory Visit: Payer: Self-pay

## 2019-05-27 MED ORDER — ROSUVASTATIN CALCIUM 10 MG PO TABS: 10.0000 mg | ORAL_TABLET | Freq: Every day | ORAL | 0 refills | Status: DC

## 2019-05-28 ENCOUNTER — Other Ambulatory Visit: Payer: Self-pay

## 2019-05-28 MED ORDER — ROSUVASTATIN CALCIUM 10 MG PO TABS: 10.0000 mg | ORAL_TABLET | Freq: Every day | ORAL | 0 refills | Status: DC

## 2019-05-31 ENCOUNTER — Encounter: Payer: Self-pay | Admitting: Physician Assistant

## 2019-05-31 ENCOUNTER — Other Ambulatory Visit: Payer: Self-pay

## 2019-05-31 ENCOUNTER — Ambulatory Visit (INDEPENDENT_AMBULATORY_CARE_PROVIDER_SITE_OTHER): Payer: Medicare Other | Admitting: Physician Assistant

## 2019-05-31 VITALS — BP 100/60 | HR 60 | Temp 97.3°F | Resp 16 | Ht 70.0 in | Wt 217.0 lb

## 2019-05-31 DIAGNOSIS — I255 Ischemic cardiomyopathy: Secondary | ICD-10-CM

## 2019-05-31 DIAGNOSIS — S40021A Contusion of right upper arm, initial encounter: Secondary | ICD-10-CM | POA: Diagnosis not present

## 2019-05-31 NOTE — Progress Notes (Signed)
Patient presents to clinic today as a walk-in c/o bruise of right upper arm and shoulder first noticed 4 days ago.  Patient denies any noted trauma or injury to the arm.  Did note he was throwing some heavy wood on his truck and noted a popping sound in his arm.  Later in the day had a harder time moving his shoulder.  States this is completely normalized but on the subsequent day he noted the bruise appearing.  Seems to have gotten larger since onset of symptoms.  Denies any pain in the area.  Endorses normal range of motion of arm and shoulders with normal strength.  Has been working in his garden since this happened without issue.  Patient is currently on Xarelto.  Past Medical History:  Diagnosis Date  . Adenomatous colon polyp 2009; 06/2012   Colonoscopy 07/2007---Dr. Barron Schmid GI--Repeat 06/2012 showed one tubular adenoma w/out high grade dysplasia.  [name] AICD (automatic cardioverter/defibrillator) present    [employer]  . Atrial fibrillation ([place])   . Bilateral sensorineural hearing loss   . BPH (benign prostatic hypertrophy)    PSAs ok (followed by urologist): no LUTS  . CAD (coronary artery disease)    PTCA RCA 1997 (Inf/post MI);  . CHF (congestive heart failure) ([place])   . Chronic constipation   . Chronic rhinitis    ?vasomotor (Dr. Erik Obey)  . COPD (chronic obstructive pulmonary disease) ([place])   . Diverticulosis of colon 2005   Noted on Colonoscopies in 2005 and 2009 and on CT 2010  . Gallstones 06/2014   No evidence of cholecystitis on abd u/s 06/2014  . GERD (gastroesophageal reflux disease)   . Hepatic steatosis 2010; 2016   Noted on CT abd 2010 and on u/s abd 04/2009 (transaminasemia) and u/s 06/2014.  [name] Hyperlipidemia   . Hypertension   . Ischemic cardiomyopathy   . Microscopic hematuria    Cysto neg 02/2009 except for BPH-likely has microhem from his renal stone dz  . Nephrolithiasis    No distal stones on CT 2010; stable 8 mm RLP stone, 33m LMP stone 10/2013  plain film.  Abd u/s 06/2014: 9 mm nonobstructing lower pole right renal stone.  03/2015 urol: stable nonobstructing renal calc by CT 12/2014 and KUB 03/2015.  .Marland KitchenObstructive sleep apnea    no cpap uses since weight loss of 50 pounds  . Presence of permanent cardiac pacemaker    [employer] . Squamous cell carcinoma of back   . Ventricular tachycardia ([place]     Current Outpatient Medications on File Prior to Visit  Medication Sig Dispense Refill  . antiseptic oral rinse (BIOTENE) LIQD 15 mLs by Mouth Rinse route as needed for dry mouth (TID).    .Marland Kitchenbudesonide-formoterol (SYMBICORT) 160-4.5 MCG/ACT inhaler Inhale 2 puffs into the lungs 2 (two) times daily. 1 Inhaler 12  . carvedilol (COREG) 12.5 MG tablet Take 1 tablet (12.5 mg total) by mouth 2 (two) times daily with a meal. 180 tablet 2  . cholecalciferol (VITAMIN D) 1000 units tablet Take 1,000 Units by mouth daily.    .Marland KitchenENTRESTO 24-26 MG TAKE 1 TABLET BY MOUTH TWICE DAILY 180 tablet 2  . eplerenone (INSPRA) 25 MG tablet Take 1 tablet (25 mg total) by mouth daily. 30 tablet 11  . fluticasone (FLONASE) 50 MCG/ACT nasal spray SHAKE LIQUID AND USE 2 SPRAYS IN EACH NOSTRIL TWICE DAILY 32 g 12  . furosemide (LASIX) 40 MG tablet TAKE 2 TABLETS BY MOUTH EVERY  DAY 90 tablet 3  . ipratropium (ATROVENT) 0.06 % nasal spray USE 2 SPRAYS IN EACH NOSTRIL THREE TIMES DAILY 15 mL 12  . loratadine (CLARITIN) 10 MG tablet Take 10 mg by mouth daily.    . Magnesium 250 MG TABS Take 1 tablet by mouth daily.    [name] mexiletine (MEXITIL) 150 MG capsule TAKE 2 CAPSULES(300 MG) BY MOUTH TWICE DAILY 360 capsule 2  . Multiple Vitamin (MULTIVITAMIN WITH MINERALS) TABS Take 1 tablet by mouth daily.    . nitroGLYCERIN (NITROSTAT) 0.4 MG SL tablet DISSOLVE 1 TABLET UNDER THE TONGUE EVERY 5 MINUTES AS NEEDED FOR CHEST PAIN 25 tablet 5  . polyvinyl alcohol-povidone (REFRESH) 1.4-0.6 % ophthalmic solution Place 1-2 drops into both eyes as needed (dry eyes).     . potassium  chloride SA (KLOR-CON) 20 MEQ tablet TAKE 1 TABLET(20 MEQ) BY MOUTH DAILY 90 tablet 3  . Probiotic Product (PROBIOTIC DAILY PO) Take 1 tablet by mouth daily.    . rivaroxaban (XARELTO) 20 MG TABS tablet Take 1 tablet (20 mg total) by mouth daily with supper. 90 tablet 1  . rosuvastatin (CRESTOR) 10 MG tablet Take 1 tablet (10 mg total) by mouth daily. 90 tablet 0  . VENTOLIN HFA 108 (90 Base) MCG/ACT inhaler INHALE 1 TO 2 PUFFS INTO THE LUNGS EVERY 4 HOURS AS NEEDED FOR WHEEZING OR SHORTNESS OF BREATH 18 g 5  . Wheat Dextrin (BENEFIBER PO) Take 2 Scoops by mouth 2 (two) times daily.     No current facility-administered medications on file prior to visit.    Allergies  Allergen Reactions  . Sulfa Antibiotics Rash  . Crestor [Rosuvastatin] Rash    Family History  Problem Relation Age of Onset  . Heart disease Mother   . Other Mother        colon surgery for fistula  . Gallstones Mother   . Breast cancer Maternal Grandmother   . Coronary artery disease Father        family hx of  . Heart attack Father        4 stents/ pacemaker  . Nephrolithiasis Father   . Hypertension Sister   . Goiter Paternal Grandmother   . Nephrolithiasis Son   . Colon cancer Neg Hx     Social History   Socioeconomic History  . Marital status: Married    Spouse name: Not on file  . Number of children: 3  . Years of education: Not on file  . Highest education level: Not on file  Occupational History  . Occupation: Retired    Fish farm manager: RETIRED  Tobacco Use  . Smoking status: Former Smoker    Packs/day: 2.00    Years: 15.00    Pack years: 30.00    Types: Cigarettes    Quit date: 01/15/1980    Years since quitting: 39.4  . Smokeless tobacco: Never Used  Substance and Sexual Activity  . Alcohol use: No    Alcohol/week: 0.0 standard drinks  . Drug use: No  . Sexual activity: Not on file  Other Topics Concern  . Not on file  Social History Narrative   Married.   Hx of cigarettes: quit in the  1980s.  No alc/drugs.   Social Determinants of Health   Financial Resource Strain:   . Difficulty of Paying Living Expenses:   Food Insecurity:   . Worried About Charity fundraiser in the Last Year:   . [place] in the Last Year:  Transportation Needs:   . Film/video editor (Medical):   [name] Lack of Transportation (Non-Medical):   Physical Activity:   . Days of Exercise per Week:   . Minutes of Exercise per Session:   Stress:   . Feeling of Stress :   Social Connections:   . Frequency of Communication with Friends and Family:   . Frequency of Social Gatherings with Friends and Family:   . Attends Religious Services:   . Active Member of Clubs or Organizations:   . Attends Archivist Meetings:   [name] Marital Status:    Review of Systems - See HPI.  All other ROS are negative.  BP 100/60   Pulse 60   Temp (!) 97.3 F (36.3 C) (Temporal)   Resp 16   Ht _0  (1.778 m)   Wt 217 lb (98.4 kg)   SpO2 99%   BMI 31.14 kg/m   Physical Exam Constitutional:      Appearance: Normal appearance.  HENT:     Head: Normocephalic and atraumatic.  Eyes:     Conjunctiva/sclera: Conjunctivae normal.  Pulmonary:     Effort: Pulmonary effort is normal.  Musculoskeletal:     Right shoulder: Normal range of motion. Normal strength. Normal pulse.     Right upper arm: No tenderness or bony tenderness.     Right forearm: Normal.     Cervical back: Neck supple.  Skin:      Neurological:     Mental Status: He is alert.  Psychiatric:        Mood and Affect: Mood normal.     Recent Results (from the past 2160 hour(s))  CUP PACEART REMOTE DEVICE CHECK     Status: None   Collection Time: 03/31/19  3:14 AM  Result Value Ref Range   Date Time Interrogation Session [id number]    Pulse Generator Manufacturer BOST    Pulse Gen Model N119 COGNIS 100-D    Pulse Gen Serial Number [id number]    Clinic Name [place] Pulse Generator Type Cardiac  Resynch Therapy Defibulator    Implantable Pulse Generator Implant Date 49702637    Implantable Lead Manufacturer [facility]    Implantable Lead Model 8588 Attain OTW    Implantable Lead Serial Number [id number]    Implantable Lead Implant Date [id number]    Implantable Lead Location Detail 1 Lateral Wall    Implantable Lead Location [id number]    Implantable Lead Manufacturer GUIC    Implantable Lead Model 0148 Endotak Reliance    Implantable Lead Serial Number [id number]    Implantable Lead Implant Date [id number]    Implantable Lead Location Detail 1 APEX    Implantable Lead Special Function Diagnosis:  Sustained monomorphic VT    Implantable Lead Location [id number]    Implantable Lead Manufacturer [facility]    Implantable Lead Model 6940 CapSureFix    Implantable Lead Serial Number [id number]    Implantable Lead Implant Date [id number]    Implantable Lead Location Detail 1 APPENDAGE    Implantable Lead Special Function Diagnosis:  Sustained monomorphic VT    Implantable Lead Location [phone]    Lead Channel Setting Sensing Sensitivity 0.5 mV   Lead Channel Setting Sensing Adaptation Mode Adaptive Sensing    Lead Channel Setting Sensing Sensitivity 1.0 mV   Lead Channel Setting Sensing Adaptation Mode Adaptive Sensing    Lead Channel Setting Pacing Amplitude 2.0 V   Lead Channel Setting Pacing Pulse Width 0.4 ms   Lead  Channel Setting Pacing Amplitude 2.5 V   Lead Channel Setting Pacing Pulse Width 1.0 ms   Lead Channel Setting Pacing Amplitude 2.5 V   Lead Channel Impedance Value 873 ohm   Lead Channel Pacing Threshold Amplitude 1.9 V   Lead Channel Pacing Threshold Pulse Width 1.0 ms   Lead Channel Impedance Value 527 ohm   Lead Channel Pacing Threshold Amplitude 0.7 V   Lead Channel Pacing Threshold Pulse Width 0.3 ms   Lead Channel Impedance Value 603 ohm   Lead Channel Pacing Threshold Amplitude 1.0 V   Lead Channel Pacing Threshold Pulse Width 0.4 ms   HighPow Impedance 49 ohm   Battery Status  MOS    Battery Remaining Longevity 12 mo   Battery Remaining Percentage 20 %   Brady Statistic RA Percent Paced 95 %   Brady Statistic RV Percent Paced 99 %  Lipid panel     Status: Abnormal   Collection Time: 05/17/19 11:05 AM  Result Value Ref Range   Cholesterol 118 0 - 200 mg/dL    Comment: ATP III Classification       Desirable:  < 200 mg/dL               Borderline High:  200 - 239 mg/dL          High:  > = 240 mg/dL   Triglycerides 101.0 0.0 - 149.0 mg/dL    Comment: Normal:  <150 mg/dLBorderline High:  150 - 199 mg/dL   HDL 30.20 (L) >39.00 mg/dL   VLDL 20.2 0.0 - 40.0 mg/dL   LDL Cholesterol 67 0 - 99 mg/dL   Total CHOL/HDL Ratio 4     Comment:                Men          Women1/2 Average Risk     3.4          3.3Average Risk          5.0          4.42X Average Risk          9.6          7.13X Average Risk          15.0          11.0                       NonHDL 87.48     Comment: NOTE:  Non-HDL goal should be 30 mg/dL higher than patient's LDL goal (i.e. LDL goal of < 70 mg/dL, would have non-HDL goal of < 100 mg/dL)  Basic metabolic panel     Status: Abnormal   Collection Time: 05/17/19 11:05 AM  Result Value Ref Range   Sodium 135 135 - 145 mEq/L   Potassium 4.2 3.5 - 5.1 mEq/L   Chloride 103 96 - 112 mEq/L   CO2 26 19 - 32 mEq/L   Glucose, Bld 110 (H) 70 - 99 mg/dL   BUN 13 6 - 23 mg/dL   Creatinine, Ser 0.86 0.40 - 1.50 mg/dL   GFR 86.09 >60.00 mL/min   Calcium 8.8 8.4 - 10.5 mg/dL  TSH     Status: None   Collection Time: 05/17/19 11:05 AM  Result Value Ref Range   TSH 2.41 0.35 - 4.50 uIU/mL  Hepatic function panel     Status: None   Collection Time: 05/17/19 11:05 AM  Result Value Ref  Range   Total Bilirubin 0.7 0.2 - 1.2 mg/dL   Bilirubin, Direct 0.1 0.0 - 0.3 mg/dL   Alkaline Phosphatase 59 39 - 117 U/L   AST 21 0 - 37 U/L   ALT 19 0 - 53 U/L   Total Protein 6.2 6.0 - 8.3 g/dL   Albumin 3.8 3.5 - 5.2 g/dL  CBC with Differential/Platelet     Status:  Abnormal   Collection Time: 05/17/19 11:05 AM  Result Value Ref Range   WBC 6.9 4.0 - 10.5 K/uL   RBC 4.11 (L) 4.22 - 5.81 Mil/uL   Hemoglobin 13.8 13.0 - 17.0 g/dL   HCT 42.0 39.0 - 52.0 %   MCV 102.0 (H) 78.0 - 100.0 fl   MCHC 32.9 30.0 - 36.0 g/dL   RDW 14.0 11.5 - 15.5 %   Platelets 288.0 150.0 - 400.0 K/uL   Neutrophils Relative % 53.5 43.0 - 77.0 %   Lymphocytes Relative 28.7 12.0 - 46.0 %   Monocytes Relative 9.7 3.0 - 12.0 %   Eosinophils Relative 6.9 (H) 0.0 - 5.0 %   Basophils Relative 1.2 0.0 - 3.0 %   Neutro Abs 3.7 1.4 - 7.7 K/uL   Lymphs Abs 2.0 0.7 - 4.0 K/uL   Monocytes Absolute 0.7 0.1 - 1.0 K/uL   Eosinophils Absolute 0.5 0.0 - 0.7 K/uL   Basophils Absolute 0.1 0.0 - 0.1 K/uL  Hemoglobin A1c     Status: None   Collection Time: 05/18/19  9:25 AM  Result Value Ref Range   Hgb A1c MFr Bld 5.9 4.6 - 6.5 %    Comment: Glycemic Control Guidelines for People with Diabetes:Non Diabetic:  <6%Goal of Therapy: <7%Additional Action Suggested:  >8%     Assessment/Plan: 1. Hematoma of arm, right, initial encounter No residual hematoma, has ruptured with resulting ecchymosis. ROM of shoulder and elbow within normal limits. Normal proximal and distal strength. Biceps and triceps within normal limits on exam. Reassurance given. Supportive measures reviewed. Should continue to resolve over the next few weeks. Strict return precautions reviewed with patient who voiced understanding and agreement with the plan.   This visit occurred during the SARS-CoV-2 public health emergency.  Safety protocols were in place, including screening questions prior to the visit, additional usage of staff PPE, and extensive cleaning of exam room while observing appropriate contact time as indicated for disinfecting solutions.     Leeanne Rio, PA-C

## 2019-05-31 NOTE — Patient Instructions (Signed)
Since the hematoma has ruptured and was just of the superficial tissue, nothing further needed. This will go away over the next few weeks. You can take tylenol if needed for any mild tenderness. You really should not note any new or worsening symptoms from this point forward. If you do, we would want to know so we can reassess.   This is an issue that looks a lot scarier than it is in your case.  Hang in there!  Hematoma A hematoma is a collection of blood. A hematoma can happen:  Under the skin.  In an organ.  In a body space.  In a joint space.  In other tissues. The blood can thicken (clot) to form a lump that you can see and feel. The lump is often hard and may become sore and tender. The lump can be very small or very big. Most hematomas get better in a few days to weeks. However, some hematomas may be serious and need medical care. What are the causes? This condition is caused by:  An injury.  Blood that leaks under the skin.  Problems from surgeries.  Medical conditions that cause bleeding or bruising. What increases the risk? You are more likely to develop this condition if:  You are an older adult.  You use medicines that thin your blood. What are the signs or symptoms? Symptoms depend on where the hematoma is in your body.  If the hematoma is under the skin, there is: ? A firm lump on the body. ? Pain and tenderness in the area. ? Bruising. The skin above the lump may be blue, dark blue, purple-red, or yellowish.  If the hematoma is deep in the tissues or body spaces, there may be: ? Blood in the stomach. This may cause pain in the belly (abdomen), weakness, passing out (fainting), and shortness of breath. ? Blood in the head. This may cause a headache, weakness, trouble speaking or understanding speech, or passing out. How is this diagnosed? This condition is diagnosed based on:  Your medical history.  A physical exam.  Imaging tests, such as  ultrasound or CT scan.  Blood tests. How is this treated? Treatment depends on the cause, size, and location of the hematoma. Treatment may include:  Doing nothing. Many hematomas go away on their own without treatment.  Surgery or close monitoring. This may be needed for large hematomas or hematomas that affect the body's organs.  Medicines. These may be given if a medical condition caused the hematoma. Follow these instructions at home: Managing pain, stiffness, and swelling   If told, put ice on the area. ? Put ice in a plastic bag. ? Place a towel between your skin and the bag. ? Leave the ice on for 20 minutes, 2-3 times a day for the first two days.  If told, put heat on the affected area after putting ice on the area for two days. Use the heat source that your doctor tells you to use. This could be a moist heat pack or a heating pad. To do this: ? Place a towel between your skin and the heat source. ? Leave the heat on for 20-30 minutes. ? Remove the heat if your skin turns bright red. This is very important if you are unable to feel pain, heat, or cold. You may have a greater risk of getting burned.  Raise (elevate) the affected area above the level of your heart while you are sitting or lying down.  Wrap the affected area with an elastic bandage, if told by your doctor. Do not wrap the bandage too tightly.  If your hematoma is on a leg or foot and is painful, your doctor may give you crutches. Use them as told by your doctor. General instructions  Take over-the-counter and prescription medicines only as told by your doctor.  Keep all follow-up visits as told by your doctor. This is important. Contact a doctor if:  You have a fever.  The swelling or bruising gets worse.  You start to get more hematomas. Get help right away if:  Your pain gets worse.  Your pain is not getting better with medicine.  Your skin over the hematoma breaks or starts to bleed.  Your  hematoma is in your chest or belly and you: ? Pass out. ? Feel weak. ? Become short of breath.  You have a hematoma on your scalp that is caused by a fall or injury, and you: ? Have a headache that gets worse. ? Have trouble speaking or understanding speech. ? Become less alert or you pass out. Summary  A hematoma is a collection of blood in any part of your body.  Most hematomas get better on their own in a few days to weeks. Some may need medical care.  Follow instructions from your doctor about how to care for your hematoma.  Contact a doctor if the swelling or bruising gets worse, or if you are short of breath. This information is not intended to replace advice given to you by your health care provider. Make sure you discuss any questions you have with your health care provider. Document Revised: 06/05/2017 Document Reviewed: 06/05/2017 Elsevier Patient Education  2020 [employer].

## 2019-06-02 ENCOUNTER — Encounter: Payer: Self-pay | Admitting: Internal Medicine

## 2019-06-11 NOTE — Progress Notes (Signed)
HPI: FU history of coronary artery disease, mixed ischemic and nonischemic cardiomyopathy, history of ICD, paroxysmal atrial fibrillation, and ventricular tachycardia. Previous abdominal ultrasound in April 2006 showed no aneurysm. Last cardiac catheterization in October 2008 showed an occluded right coronary artery with left-to-right collateralization. Ejection fraction is 20%. There was no other coronary disease noted. Last Myoview in Dec 2011 showed prior inferior infarct but no ischemia. His ejection fraction was 19%. ABIs in December of 2011 were normal. Previous carotid Dopplers in Sept 2014 showed 0-39% stenosis bilaterally.The patient has had upgrade of his device to CRT-D. Patient had revision of his left ventricular lead in December 2013. Echocardiogram January 2017 showed ejection fraction 44-62%, grade 1 diastolic dysfunction, mild mitral regurgitation, severe left atrial enlargement, mild right sided enlargement and mild tricuspid regurgitation. Amiodarone discontinued March 2019 because of potential pulmonary toxicity. High resolution chest CT February 2020 showed mild pulmonary fibrosis, mild emphysema. Since last seen,he denies dyspnea, chest pain, palpitations or syncope.  He had some dizziness with standing at times back in February.  Current Outpatient Medications  Medication Sig Dispense Refill  . antiseptic oral rinse (BIOTENE) LIQD 15 mLs by Mouth Rinse route as needed for dry mouth (TID).    [name] budesonide-formoterol (SYMBICORT) 160-4.5 MCG/ACT inhaler Inhale 2 puffs into the lungs 2 (two) times daily. 1 Inhaler 12  . carvedilol (COREG) 12.5 MG tablet Take 1 tablet (12.5 mg total) by mouth 2 (two) times daily with a meal. 180 tablet 2  . cholecalciferol (VITAMIN D) 1000 units tablet Take 1,000 Units by mouth daily.    [name] ENTRESTO 24-26 MG TAKE 1 TABLET BY MOUTH TWICE DAILY 180 tablet 2  . eplerenone (INSPRA) 25 MG tablet Take 1 tablet (25 mg total) by mouth daily. 30 tablet  11  . fluticasone (FLONASE) 50 MCG/ACT nasal spray SHAKE LIQUID AND USE 2 SPRAYS IN EACH NOSTRIL TWICE DAILY 32 g 12  . furosemide (LASIX) 40 MG tablet TAKE 2 TABLETS BY MOUTH EVERY DAY 90 tablet 3  . ipratropium (ATROVENT) 0.06 % nasal spray USE 2 SPRAYS IN EACH NOSTRIL THREE TIMES DAILY 15 mL 12  . loratadine (CLARITIN) 10 MG tablet Take 10 mg by mouth daily.    . Magnesium 250 MG TABS Take 1 tablet by mouth daily.    [name] mexiletine (MEXITIL) 150 MG capsule TAKE 2 CAPSULES(300 MG) BY MOUTH TWICE DAILY 360 capsule 2  . Multiple Vitamin (MULTIVITAMIN WITH MINERALS) TABS Take 1 tablet by mouth daily.    . nitroGLYCERIN (NITROSTAT) 0.4 MG SL tablet DISSOLVE 1 TABLET UNDER THE TONGUE EVERY 5 MINUTES AS NEEDED FOR CHEST PAIN 25 tablet 5  . polyvinyl alcohol-povidone (REFRESH) 1.4-0.6 % ophthalmic solution Place 1-2 drops into both eyes as needed (dry eyes).     . potassium chloride SA (KLOR-CON) 20 MEQ tablet TAKE 1 TABLET(20 MEQ) BY MOUTH DAILY 90 tablet 3  . Probiotic Product (PROBIOTIC DAILY PO) Take 1 tablet by mouth daily.    . rivaroxaban (XARELTO) 20 MG TABS tablet Take 1 tablet (20 mg total) by mouth daily with supper. 90 tablet 1  . rosuvastatin (CRESTOR) 10 MG tablet Take 1 tablet (10 mg total) by mouth daily. 90 tablet 0  . VENTOLIN HFA 108 (90 Base) MCG/ACT inhaler INHALE 1 TO 2 PUFFS INTO THE LUNGS EVERY 4 HOURS AS NEEDED FOR WHEEZING OR SHORTNESS OF BREATH 18 g 5  . Wheat Dextrin (BENEFIBER PO) Take 2 Scoops by mouth 2 (two) times daily.  No current facility-administered medications for this visit.     Past Medical History:  Diagnosis Date  . Adenomatous colon polyp 2009; 06/2012   Colonoscopy 07/2007---Dr. Barron Schmid GI--Repeat 06/2012 showed one tubular adenoma w/out high grade dysplasia.  [name] AICD (automatic cardioverter/defibrillator) present    [employer]  . Atrial fibrillation ([place])   . Bilateral sensorineural hearing loss   . BPH (benign prostatic hypertrophy)     PSAs ok (followed by urologist): no LUTS  . CAD (coronary artery disease)    PTCA RCA 1997 (Inf/post MI);  . CHF (congestive heart failure) ([place])   . Chronic constipation   . Chronic rhinitis    ?vasomotor (Dr. Erik Obey)  . COPD (chronic obstructive pulmonary disease) ([place])   . Diverticulosis of colon 2005   Noted on Colonoscopies in 2005 and 2009 and on CT 2010  . Gallstones 06/2014   No evidence of cholecystitis on abd u/s 06/2014  . GERD (gastroesophageal reflux disease)   . Hepatic steatosis 2010; 2016   Noted on CT abd 2010 and on u/s abd 04/2009 (transaminasemia) and u/s 06/2014.  [name] Hyperlipidemia   . Hypertension   . Ischemic cardiomyopathy   . Microscopic hematuria    Cysto neg 02/2009 except for BPH-likely has microhem from his renal stone dz  . Nephrolithiasis    No distal stones on CT 2010; stable 8 mm RLP stone, 37m LMP stone 10/2013 plain film.  Abd u/s 06/2014: 9 mm nonobstructing lower pole right renal stone.  03/2015 urol: stable nonobstructing renal calc by CT 12/2014 and KUB 03/2015.  .Marland KitchenObstructive sleep apnea    no cpap uses since weight loss of 50 pounds  . Presence of permanent cardiac pacemaker    [employer] . Squamous cell carcinoma of back   . Ventricular tachycardia ([facility]     Past Surgical History:  Procedure Laterality Date  . ATRIAL ABLATION SURGERY     flutter ablation 2002  . BREAST SURGERY Left 1990   "Tissue removed from breast"  . CARDIAC CATHETERIZATION    . carotid doppler  09/25/12   No signif stenosis  . COLONOSCOPY N/A 07/13/2012   Diverticula ascending colon.  Polyp x 1-recall 5 yrs (Dr. MWatt Climes Procedure: COLONOSCOPY;  Surgeon: MJeryl Columbia MD;  Location: [facility];  Service: Endoscopy;  Laterality: N/A; polypectomy x 1   . COLONOSCOPY WITH PROPOFOL N/A 08/18/2015   Procedure: COLONOSCOPY WITH PROPOFOL;  Surgeon: KMauri Pole MD;  Location: [facility] ENDOSCOPY;  Service: Endoscopy;  Laterality: N/A;  . ESOPHAGOGASTRODUODENOSCOPY N/A  07/13/2012   Procedure: ESOPHAGOGASTRODUODENOSCOPY (EGD);  Surgeon: MJeryl Columbia MD;  [facility];  Service: Endoscopy;  Laterality: N/A;  NORMAL  . EXTRACORPOREAL SHOCK WAVE LITHOTRIPSY    . HEMORRHOID BANDING    . ICD     a) Guidant Contak H170- SVirl Axe MD 07/25/06 (second device) b) Medtronic CRT-D  . INGUINAL HERNIA REPAIR Bilateral 1975  . LEAD REVISION Bilateral 12/26/2011   Procedure: LEAD REVISION;  Surgeon: GEvans Lance MD;  [facility];  Service: Cardiovascular;  Laterality: Bilateral;  . PFTs  02/2015   Mod restriction (interstitial), mod diffusion defect (Dr. YAnnamaria Boots  . precancer area keratsis removed  05/2015    leftf [place] and left arm and right arm and back    Social History   Socioeconomic History  . Marital status: Married    Spouse name: Not on file  . Number of children: 3  . Years of education:  Not on file  . Highest education level: Not on file  Occupational History  . Occupation: Retired    Fish farm manager: RETIRED  Tobacco Use  . Smoking status: Former Smoker    Packs/day: 2.00    Years: 15.00    Pack years: 30.00    Types: Cigarettes    Quit date: 01/15/1980    Years since quitting: 39.4  . Smokeless tobacco: Never Used  Substance and Sexual Activity  . Alcohol use: No    Alcohol/week: 0.0 standard drinks  . Drug use: No  . Sexual activity: Not on file  Other Topics Concern  . Not on file  Social History Narrative   Married.   Hx of cigarettes: quit in the 1980s.  No alc/drugs.   Social Determinants of Health   Financial Resource Strain:   . Difficulty of Paying Living Expenses:   Food Insecurity:   . Worried About Charity fundraiser in the Last Year:   . Arboriculturist in the Last Year:   Transportation Needs:   . Film/video editor (Medical):   [name] Lack of Transportation (Non-Medical):   Physical Activity:   . Days of Exercise per Week:   . Minutes of Exercise per Session:   Stress:   . Feeling of Stress :     Social Connections:   . Frequency of Communication with Friends and Family:   . Frequency of Social Gatherings with Friends and Family:   . Attends Religious Services:   . Active Member of Clubs or Organizations:   . Attends Archivist Meetings:   [name] Marital Status:   Intimate Partner Violence:   . Fear of Current or Ex-Partner:   . Emotionally Abused:   [name] Physically Abused:   . Sexually Abused:     Family History  Problem Relation Age of Onset  . Heart disease Mother   . Other Mother        colon surgery for fistula  . Gallstones Mother   . Breast cancer Maternal Grandmother   . Coronary artery disease Father        family hx of  . Heart attack Father        4 stents/ pacemaker  . Nephrolithiasis Father   . Hypertension Sister   . Goiter Paternal Grandmother   . Nephrolithiasis Son   . Colon cancer Neg Hx     ROS: no fevers or chills, productive cough, hemoptysis, dysphasia, odynophagia, melena, hematochezia, dysuria, hematuria, rash, seizure activity, orthopnea, PND, pedal edema, claudication. Remaining systems are negative.  Physical Exam: Well-developed well-nourished in no acute distress.  Skin is warm and dry.  HEENT is normal.  Neck is supple.  Chest is clear to auscultation with normal expansion.  Cardiovascular exam is regular rate and rhythm.  Abdominal exam nontender or distended. No masses palpated. Extremities show no edema. neuro grossly intact  ECG-sinus with ventricular pacing.  Personally reviewed  A/P  1 coronary artery disease-patient continues to do well with no complaints of chest pain.  Continue Crestor.  He is not on aspirin given need for Xarelto.  2 paroxysmal atrial fibrillation-patient remains in sinus rhythm.  Continue carvedilol and Xarelto.    3 ischemic cardiomyopathy-continue Entresto and carvedilol (given history of dizziness and borderline blood pressure I will decrease to 6.25 mg twice daily).  We will repeat  echocardiogram.  4 chronic systolic congestive heart failure-he appears to be doing well from a volume standpoint.  Continue Lasix and eplerenone  at present dose.    5 hypertension-patient's blood pressure is borderline.  As outlined above decrease carvedilol and follow.  6 history of ventricular tachycardia-no recurrences by report.  Amiodarone was discontinued previously due to potential lung toxicity.  Continue beta-blocker and mexiletine.  7 hyperlipidemia-increase Crestor to 40 mg daily.  Discontinue Zetia.  Check lipids and liver in 12 weeks.  8 biventricular ICD-followed by Dr. Caryl Comes.  Kirk Ruths, MD

## 2019-06-16 ENCOUNTER — Other Ambulatory Visit: Payer: Self-pay

## 2019-06-16 ENCOUNTER — Ambulatory Visit (INDEPENDENT_AMBULATORY_CARE_PROVIDER_SITE_OTHER): Payer: Medicare Other | Admitting: Cardiology

## 2019-06-16 ENCOUNTER — Encounter: Payer: Self-pay | Admitting: Cardiology

## 2019-06-16 VITALS — BP 94/60 | HR 60 | Ht 70.0 in | Wt 209.4 lb

## 2019-06-16 DIAGNOSIS — I4891 Unspecified atrial fibrillation: Secondary | ICD-10-CM

## 2019-06-16 DIAGNOSIS — I429 Cardiomyopathy, unspecified: Secondary | ICD-10-CM

## 2019-06-16 DIAGNOSIS — I255 Ischemic cardiomyopathy: Secondary | ICD-10-CM

## 2019-06-16 DIAGNOSIS — Z95810 Presence of automatic (implantable) cardiac defibrillator: Secondary | ICD-10-CM | POA: Diagnosis not present

## 2019-06-16 DIAGNOSIS — I059 Rheumatic mitral valve disease, unspecified: Secondary | ICD-10-CM

## 2019-06-16 DIAGNOSIS — E7800 Pure hypercholesterolemia, unspecified: Secondary | ICD-10-CM | POA: Diagnosis not present

## 2019-06-16 DIAGNOSIS — I5022 Chronic systolic (congestive) heart failure: Secondary | ICD-10-CM

## 2019-06-16 DIAGNOSIS — I480 Paroxysmal atrial fibrillation: Secondary | ICD-10-CM

## 2019-06-16 MED ORDER — ROSUVASTATIN CALCIUM 40 MG PO TABS: 40.0000 mg | ORAL_TABLET | Freq: Every day | ORAL | 3 refills | Status: DC

## 2019-06-16 MED ORDER — ENTRESTO 24-26 MG PO TABS: 1.0000 | ORAL_TABLET | Freq: Two times a day (BID) | ORAL | 3 refills | Status: DC

## 2019-06-16 MED ORDER — NITROGLYCERIN 0.4 MG SL SUBL: SUBLINGUAL_TABLET | SUBLINGUAL | 5 refills | Status: DC

## 2019-06-16 MED ORDER — RIVAROXABAN 20 MG PO TABS: 20.0000 mg | ORAL_TABLET | Freq: Every day | ORAL | 3 refills | Status: DC

## 2019-06-16 MED ORDER — CARVEDILOL 6.25 MG PO TABS: 6.2500 mg | ORAL_TABLET | Freq: Two times a day (BID) | ORAL | 3 refills | Status: DC

## 2019-06-16 MED ORDER — FUROSEMIDE 40 MG PO TABS: ORAL_TABLET | ORAL | 3 refills | Status: DC

## 2019-06-16 MED ORDER — POTASSIUM CHLORIDE CRYS ER 20 MEQ PO TBCR: EXTENDED_RELEASE_TABLET | ORAL | 3 refills | Status: DC

## 2019-06-16 NOTE — Patient Instructions (Signed)
Medication Instructions:   DECREASE CARVEDILOL TO 6.25 MG TWICE DAILY= 1/2 OF THE 12.5 MG TABLET TWICE DAILY  STOP EZETIMIBE  INCREASE ROSUVASTATIN TO 40 MG ONCE DAILY= 4 OF THE 10 MG TABLETS ONCE DAILY  *If you need a refill on your cardiac medications before your next appointment, please call your pharmacy*   Lab Work:  Your physician recommends that you return for lab work in:[address] [place]  If you have labs (blood work) drawn today and your tests are completely normal, you will receive your results only by:  [place] (if you have MyChart) OR  A paper copy in the mail If you have any lab test that is abnormal or we need to change your treatment, we will call you to review the results.   Testing/Procedures:  Your physician has requested that you have an echocardiogram. Echocardiography is a painless test that uses sound waves to create images of your heart. It provides your doctor with information about the size and shape of your heart and how well your hearts chambers and valves are working. This procedure takes approximately one hour. There are no restrictions for this procedure.[facility] [facility]   Follow-Up: At [facility], you and your health needs are our priority.  As part of our continuing mission to provide you with exceptional heart care, we have created designated Provider Care Teams.  These Care Teams include your primary Cardiologist (physician) and Advanced Practice Providers (APPs -  Physician Assistants and Nurse Practitioners) who all work together to provide you with the care you need, when you need it.  We recommend signing up for the patient portal called "MyChart".  Sign up information is provided on this After Visit Summary.  MyChart is used to connect with patients for Virtual Visits (Telemedicine).  Patients are able to view lab/test results, encounter notes, upcoming appointments, etc.  Non-urgent messages can be sent to your provider as  well.   To learn more about what you can do with MyChart, go to [email or web address].    Your next appointment:   6 month(s)  The format for your next appointment:   Either In Person or Virtual  Provider:   Kirk Ruths, MD

## 2019-06-22 NOTE — Progress Notes (Signed)
Chronic Care Management Pharmacy  Name: FRANCO DULEY  MRN: [id number] DOB: 27-Nov-1940  Chief Complaint/ HPI  [name],  79 y.o. , male presents for their Initial CCM visit with the clinical pharmacist via telephone due to COVID-19 Pandemic. Reports no major concerns today and feels chronic conditions such as COPD and heart failure have been very well managed. States previous feeling of dizziness has now resolved after reducing Coreg dose earlier this month. Still is able to do the everything he loves both indoors and outdoors. Physical activity such as yard work is limited to 25-30 minutes then goes inside 10-15 minutes to recharge. Is cutting down on Atrovent due to dry mouth and has now increased fluticasone nasal spray to two sprays each nostril twice daily. Recent crestor increase to 40 mg and is no longer taking zetia.    PCP : [name], MD.   Chronic conditions include:  Encounter Diagnoses  Name Primary?   Essential hypertension Yes   Atrial fibrillation, unspecified type ([place])    Coronary artery disease due to lipid rich plaque    Chronic systolic congestive heart failure (HCC)    Chronic obstructive pulmonary disease with acute exacerbation (HCC)    Gastroesophageal reflux disease without esophagitis    Pure hypercholesterolemia     Consult Visit: 06/16/2019 (Dr. Stanford Breed, cardiology): Coreg dose halved to 6.25 mg twice daily, rosuvastatin increased to 40 mg daily   Patient Active Problem List   Diagnosis Date Noted   Encounter for follow-up surveillance of colon cancer    Benign neoplasm of descending colon    Pulmonary nodules 05/18/2015   Rectal bleeding 03/29/2015   Internal hemorrhoid, bleeding 03/29/2015   COPD (chronic obstructive pulmonary disease) ([place]) 03/28/2015   Interstitial lung disease ([place]) 03/28/2015   Gastroesophageal reflux disease 03/17/2015   Prostate cancer screening 03/09/2014   Thoracic aortic aneurysm ([place])  09/28/2013   Encounter for therapeutic drug monitoring 03/03/2013   Constipation, chronic 16/38/4536   Umbilical hernia with intermittent pain 10/21/2012   Obesity (BMI 30-39.9) 10/21/2012   TIA (transient ischemic attack) 09/25/2012   Other malaise and fatigue 08/25/2012   Vitamin D deficiency 07/28/2012   CHF (congestive heart failure) ([place]) 07/28/2012   Chronotropic incompetence 10/25/2011   History of non anemic vitamin B12 deficiency 05/29/2011   Mild cognitive impairment 01/13/2011   Ringing in ears 01/13/2011   CAD (coronary artery disease) 09/14/2010   Elevated transaminase level--on amiodarone 06/19/2010   Biventricular implantable cardioverter-defibrillator in situ 05/16/2010   Long term (current) use of anticoagulants 04/11/2010   Microscopic hematuria 04/04/2010   CLAUDICATION 11/28/2009   Atrial fibrillation ([place]) 06/29/2009   Ischemic cardiomyopathy 07/07/2008   VENTRICULAR TACHYCARDIA 06/29/2008   Hyperlipidemia 01/04/2008   Essential hypertension 01/04/2008   Chronic rhinitis 03/04/2007   Obstructive sleep apnea 03/02/2007   Past Surgical History:  Procedure Laterality Date   ATRIAL ABLATION SURGERY     flutter ablation 2002   BREAST SURGERY Left 1990   "Tissue removed from breast"   CARDIAC CATHETERIZATION     carotid doppler  09/25/12   No signif stenosis   COLONOSCOPY N/A 07/13/2012   Diverticula ascending colon.  Polyp x 1-recall 5 yrs (Dr. Watt Climes) Procedure: COLONOSCOPY;  Surgeon: Jeryl Columbia, MD;  Location: [facility];  Service: Endoscopy;  Laterality: N/A; polypectomy x 1    COLONOSCOPY WITH PROPOFOL N/A 08/18/2015   Procedure: COLONOSCOPY WITH PROPOFOL;  Surgeon: Mauri Pole, MD;  Location: [facility];  Service: Endoscopy;  Laterality: N/A;   ESOPHAGOGASTRODUODENOSCOPY N/A 07/13/2012   Procedure: ESOPHAGOGASTRODUODENOSCOPY (EGD);  Surgeon: Jeryl Columbia, MD;  Location: [facility];  Service: Endoscopy;  Laterality:  N/A;  NORMAL   EXTRACORPOREAL SHOCK WAVE LITHOTRIPSY     HEMORRHOID BANDING     ICD     a) Guidant Contak H170- Virl Axe, MD 07/25/06 (second device) b) Medtronic CRT-D   INGUINAL HERNIA REPAIR Bilateral 1975   LEAD REVISION Bilateral 12/26/2011   Procedure: LEAD REVISION;  Surgeon: Evans Lance, MD;  Location: [facility] CATH LAB;  Service: Cardiovascular;  Laterality: Bilateral;   PFTs  02/2015   Mod restriction (interstitial), mod diffusion defect (Dr. Annamaria Boots)   precancer area keratsis removed  05/2015    leftf [place] and left arm and right arm and back   Social History   Socioeconomic History   Marital status: Married    Spouse name: Not on file   Number of children: 3   Years of education: Not on file   Highest education level: Not on file  Occupational History   Occupation: Retired    Fish farm manager: RETIRED  Tobacco Use   Smoking status: Former Smoker    Packs/day: 2.00    Years: 15.00    Pack years: 30.00    Types: Cigarettes    Quit date: 01/15/1980    Years since quitting: 39.4   Smokeless tobacco: Never Used  Substance and Sexual Activity   Alcohol use: No    Alcohol/week: 0.0 standard drinks   Drug use: No   Sexual activity: Not on file  Other Topics Concern   Not on file  Social History Narrative   Married.   Hx of cigarettes: quit in the 1980s.  No alc/drugs.   Social Determinants of Health   Financial Resource Strain:    Difficulty of Paying Living Expenses:   Food Insecurity: No Food Insecurity   Worried About Charity fundraiser in the Last Year: Never true   Ran Out of Food in the Last Year: Never true  Transportation Needs: No Transportation Needs   Lack of Transportation (Medical): No   Lack of Transportation (Non-Medical): No  Physical Activity:    Days of Exercise per Week:    Minutes of Exercise per Session:   Stress:    Feeling of Stress :   Social Connections:    Frequency of Communication with Friends and Family:      Frequency of Social Gatherings with Friends and Family:    Attends Religious Services:    Active Member of Clubs or Organizations:    Attends Music therapist:    Marital Status:    Family History  Problem Relation Age of Onset   Heart disease Mother    Other Mother        colon surgery for fistula   Gallstones Mother    Breast cancer Maternal Grandmother    Coronary artery disease Father        family hx of   Heart attack Father        87 stents/ pacemaker   Nephrolithiasis Father    Hypertension Sister    Goiter Paternal Grandmother    Nephrolithiasis Son    Colon cancer Neg Hx    Allergies  Allergen Reactions   Sulfa Antibiotics Rash   Crestor [Rosuvastatin] Rash   Outpatient Encounter Medications as of 06/23/2019  Medication Sig   antiseptic oral rinse (BIOTENE) LIQD 15 mLs by Mouth Rinse route as needed for dry  mouth (TID).   budesonide-formoterol (SYMBICORT) 160-4.5 MCG/ACT inhaler Inhale 2 puffs into the lungs 2 (two) times daily.   carvedilol (COREG) 6.25 MG tablet Take 1 tablet (6.25 mg total) by mouth 2 (two) times daily with a meal.   cholecalciferol (VITAMIN D) 1000 units tablet Take 1,000 Units by mouth daily.   eplerenone (INSPRA) 25 MG tablet Take 1 tablet (25 mg total) by mouth daily.   fluticasone (FLONASE) 50 MCG/ACT nasal spray SHAKE LIQUID AND USE 2 SPRAYS IN EACH NOSTRIL TWICE DAILY   furosemide (LASIX) 40 MG tablet TAKE 2 TABLETS BY MOUTH EVERY DAY   ipratropium (ATROVENT) 0.06 % nasal spray USE 2 SPRAYS IN EACH NOSTRIL THREE TIMES DAILY   loratadine (CLARITIN) 10 MG tablet Take 10 mg by mouth daily.   Magnesium 250 MG TABS Take 1 tablet by mouth daily.   mexiletine (MEXITIL) 150 MG capsule TAKE 2 CAPSULES(300 MG) BY MOUTH TWICE DAILY   Multiple Vitamin (MULTIVITAMIN WITH MINERALS) TABS Take 1 tablet by mouth daily.   nitroGLYCERIN (NITROSTAT) 0.4 MG SL tablet DISSOLVE 1 TABLET UNDER THE TONGUE EVERY 5 MINUTES  AS NEEDED FOR CHEST PAIN   polyvinyl alcohol-povidone (REFRESH) 1.4-0.6 % ophthalmic solution Place 1-2 drops into both eyes as needed (dry eyes).    potassium chloride SA (KLOR-CON) 20 MEQ tablet TAKE 1 TABLET(20 MEQ) BY MOUTH DAILY   Probiotic Product (PROBIOTIC DAILY PO) Take 1 tablet by mouth daily.   rivaroxaban (XARELTO) 20 MG TABS tablet Take 1 tablet (20 mg total) by mouth daily with supper.   rosuvastatin (CRESTOR) 40 MG tablet Take 1 tablet (40 mg total) by mouth daily.   sacubitril-valsartan (ENTRESTO) 24-26 MG Take 1 tablet by mouth 2 (two) times daily.   VENTOLIN HFA 108 (90 Base) MCG/ACT inhaler INHALE 1 TO 2 PUFFS INTO THE LUNGS EVERY 4 HOURS AS NEEDED FOR WHEEZING OR SHORTNESS OF BREATH   Wheat Dextrin (BENEFIBER PO) Take 2 Scoops by mouth 2 (two) times daily.   No facility-administered encounter medications on file as of 06/23/2019.   Patient Care Team    Relationship Specialty Notifications Start End  [name], MD PCP - General Family Medicine  07/10/15   Festus Aloe, MD Consulting Physician Urology  01/25/12   Michael Boston, MD Consulting Physician General Surgery  10/21/12   [name], MD Consulting Physician Cardiology  01/20/92   Jodi Marble, MD Consulting Physician Otolaryngology  03/25/15   Deboraha Sprang, MD Consulting Physician Cardiology  06/22/15   Luberta Mutter, MD Consulting Physician Ophthalmology  07/10/15   Collene Gobble, MD Consulting Physician Pulmonary Disease  07/10/15   Mauri Pole, MD Consulting Physician Gastroenterology  12/27/15   [name], [facility] Pharmacist Pharmacist  05/06/19    Comment: PHONE NUMBER [phone]   Current Diagnosis/Assessment: Goals Addressed            This Visit's Progress    PharmD Care Plan       CARE PLAN ENTRY  Current Barriers:   Chronic Disease Management support, education, and care coordination needs related to Hypertension, Hyperlipidemia, Atrial Fibrillation, Heart  Failure, and COPD   Hypertension / Heart Failure  Pharmacist Clinical Goal(s): o Over the next 180 days, patient will work with PharmD and providers to maintain BP goal <130/80  Current regimen:   Entresto 24-26 twice daily   Coreg 6.25 mg twice daily   Eplerenone 25 mg daily  Furosemide 80 mg daily  Klor-con 20 meq daily  Interventions: o Continue current management  Patient self care activities - Over the next 180 days, patient will: o Check BP at least once every 1-2 days, document, and provide at future appointments o Ensure daily salt intake < 2300 mg/day  Hyperlipidemia  Pharmacist Clinical Goal(s): o Over the next 180 days, patient will work with PharmD and providers to maintain LDL goal < 70  Current regimen:  o Rosuvastatin 40 mg once daily  Interventions: o Continue current management  Patient self care activities - Over the next 180 days, patient will: o Continue current management  Afib  Pharmacist Clinical Goal(s) o Over the next 180 days, patient will work with PharmD and providers to minimize symptoms of afib and maintain rate/rhythm control.  Current regimen:  o Xarelto 20 mg daily with supper o Mexitil 150 mg caps - two caps twice daily   Interventions: o Continue current management  Patient self care activities - Over the next 180 days, patient will: o Continue current management  Medication management  Pharmacist Clinical Goal(s): o Over the next 180 days, patient will work with PharmD and providers to maintain optimal medication adherence  Current pharmacy: [facility]  Interventions o Comprehensive medication review performed. o Continue current medication management strategy  Patient self care activities - Over the next 180 days, patient will: o Focus on medication adherence by continuing current medication management o Take medications as prescribed o Report any questions or concerns to PharmD and/or  provider(s) COPD  Pharmacist Clinical Goal(s) o Over the next 180 days, patient will work with PharmD and providers to minimize COPD symptoms  Current regimen:   Symbicort 160-4.5 two puff twice daily  Ventolin 1-2 puffs every 4 hours as needed  Interventions: o Continue current management  Patient self care activities - Over the next 180 days, patient will: o Continue current management Initial goal documentation.      Heart failure    Coreg has been reduced to 6.25 mg twice daily from 12.5 mg twice daily 06/2019 visit with cardiologist.  BP Readings from Last 3 Encounters:  06/16/19 94/60  05/31/19 100/60  05/17/19 112/71   CMP Latest Ref Rng & Units 05/17/2019 10/29/2018 01/22/2018  Glucose 70 - 99 mg/dL 110(H) 118(H) 85  BUN 6 - 23 mg/dL _0 Creatinine 0.40 - 1.50 mg/dL 0.86 0.87 0.98  Sodium 135 - 145 mEq/L 135 135 143  Potassium 3.5 - 5.1 mEq/L 4.2 4.1 4.4  Chloride 96 - 112 mEq/L 103 103 104  CO2 19 - 32 mEq/L _1 Calcium 8.4 - 10.5 mg/dL 8.8 8.8 9.0  Total Protein 6.0 - 8.3 g/dL 6.2 6.4 -  Total Bilirubin 0.2 - 1.2 mg/dL 0.7 0.7 -  Alkaline Phos 39 - 117 U/L 59 48 -  AST 0 - 37 U/L 21 19 -  ALT 0 - 53 U/L 19 18 -   EF is 46%, grade 1 diastolic dysfunction, mild mitral regurgitation.  BP well controlled at visits. Denies dizziness, SOB, chest pain. Electrolytes stable. Previous dizziness associated with low BP has now resolved with Coreg dose change. Patient is currently controlled on the following medications:   Entresto 24-26 twice daily   Coreg 6.25 mg twice daily   Eplerenone 25 mg daily  Furosemide 80 mg daily  Klor-con 20 meq daily   Plan  Continue current medications and control with diet and exercise    AFIB   Previous PT with amiodarone. Denies any recent SOB or palpitations.  Pulse Readings from Last 3 Encounters:  06/16/19 60  05/31/19 60  05/17/19 80   Patient is currently rate controlled at consistently <70 BPM. Coreg  noted in HF a/p. Denies dizziness, SOB, chest pain. Patient is currently controlled on the following medications:   Mexitil 150 mg capsule - two caps twice daily   Stroke prevention in afib. This patients CHA2DS2-VASc Score = 5. On AC with Xarelto. No current issues with accessibility.  Denies any abnormal bruising, bleeding from nose or gums or blood in urine or stool. Patient is currently controlled on the following medications:   Xarelto 20 mg every day with supper   Plan  Continue current medications.  COPD / Asthma / Tobacco   Exacerbation likely triggered by allergies 05/17/2019 - given amoxicillin. Uses rescue roughly once every other day. Has not had any additional exacerbation within last year. Last spirometry score: 01/2018.    Ref Range & Units 1 yr ago 2 yr ago 4 yr ago  FVC-Pre L 3.02  2.82 P  3.20   FVC-%Pred-Pre % 69  64 P  71   FVC-Post L 2.97  2.78 P  3.19   FVC-%Pred-Post % 67  63 P  71   FVC-%Change-Post % -1  -1 P  0   FEV1-Pre L 2.48  2.30 P  2.61   FEV1-%Pred-Pre % 78  72 P  80   FEV1-Post L 2.42  2.30 P  2.61   FEV1-%Pred-Post % 76  72 P  79   FEV1-%Change-Post % -2  0 P  0   FEV6-Pre L 3.02  2.80 P  3.20   FEV6-%Pred-Pre % 73  68 P  76   FEV6-Post L 2.97  2.77 P  3.16   FEV6-%Pred-Post % 72  67 P  75   FEV6-%Change-Post % -1  -1 P  -1   Pre FEV1/FVC ratio % 82  82 P  82   FEV1FVC-%Pred-Pre % 113  113 P  111   Post FEV1/FVC ratio % 82  83 P  82   FEV1FVC-%Change-Post % 0  1 P  0   Pre FEV6/FVC Ratio % 100  100 P  100   FEV6FVC-%Pred-Pre % 106  106 P  106   Post FEV6/FVC ratio % 100  100 P  99   FEV6FVC-%Pred-Post % 106  106 P  105   FEV6FVC-%Change-Post % 0   0   FEF 25-75 Pre L/sec 2.73  2.49 P  2.68   FEF2575-%Pred-Pre % 120  108 P  111   FEF 25-75 Post L/sec 2.44  2.50 P  2.71   FEF2575-%Pred-Post % 107  108 P  112   FEF2575-%Change-Post % -10  0 P  1   RV L 1.47  1.79 P  1.15   RV % pred % 56  68 P  44   TLC L 4.65  4.94 P  4.59   TLC % pred  % 64  68 P  63   DLCO unc ml/min/mmHg 18.10  18.46 P  19.06   DLCO unc % pred % 53  54 P  56   DLCO cor ml/min/mmHg 17.90     DLCO cor % pred % 53     DL/VA ml/min/mmHg/L 4.20  4.45 P  4.23   DL/VA % pred % 90  95 P  90      Lab Results  Component Value Date/Time   EOSPCT 6.9 (H) 05/17/2019 11:05 AM   Lab  Results  Component Value Date/Time   EOSABS 0.5 05/17/2019 11:05 AM   Social History   Tobacco Use  Smoking Status Former Smoker   Packs/day: 2.00   Years: 15.00   Pack years: 30.00   Types: Cigarettes   Quit date: 01/15/1980   Years since quitting: 39.4  Smokeless Tobacco Never Used   Rank each of the following items on a scale of 0 to 5 (with 5 being most severe) Write a # 0-5 in each box  I never cough (0) > I cough all the time (5) 3  I have no phlegm (mucus) in my chest (0) > My chest is completely full of phlegm (mucus) (5) 4  My chest does not feel tight at all (0) > My chest feels very tight (5) 2  When I walk up a hill or one flight of stairs I am not breathless (0) > When I walk up a hill or one flight of stairs I am very breathless (5) 3  I am not limited doing any activities at home (0) > I am very limited doing activities at home (5) 1  I am confident leaving my home despite my lung function (0) > I am not at all confident leaving my home because of my lung condition (5)  0  I sleep soundly (0) > I don't sleep soundly because of my lung condition (5) 3  I have lots of energy (0) > I have no energy at all (5) 3   Total = 17   Patient is currently controlled on the following medications:   Symbicort 160-4.5 two puff twice daily  Ventolin 1-2 puffs every 4 hours as needed  Plan  Continue current medications.  Hyperlipidemia   Recently taken off Zetia and Crestor increased to 40 mg from 10 mg. Followed by Dr. Stanford Breed.  Is active with yard work, fishing, walking nearly every day. Has managed to significantly cut down on sugar intake.     Component  Value Date/Time   CHOL 118 05/17/2019 1105   TRIG 101.0 05/17/2019 1105   HDL 30.20 (L) 05/17/2019 1105   LDLCALC 67 05/17/2019 1105   LDLCALC 169 (H) 06/11/2017 1637   LDLDIRECT 168.0 07/12/2015 0928    The ASCVD Risk score (Goff DC Jr., et al., 2013) failed to calculate for the following reasons:   The valid total cholesterol range is 130 to 320 mg/dL   LDL well controlled. Denies any muscle or abdominal pain or n/v.  Patient is currently controlled on the following medications:   Crestor 40 mg daily   Plan  Continue current medications   Seasonal allergies   Allergies have not been an issues as long as medications are taken. Dry mouth has improved with reduction in atrovent use. Patient is currently controlled on the following medications:   Atrovent 2 sprays three times daily  Fluticisone 2 sprays both nares twice daily (16 sprays/day)  Claritin 10 mg daily   Plan  Continue current medications   Vaccines   Reviewed and discussed patient's vaccination history.    Immunization History  Administered Date(s) Administered   Fluad Quad(high Dose 65+) 09/23/2018   Influenza Split 11/11/2010, 11/02/2012, 10/14/2013   Influenza Whole 09/27/2011, 11/13/2011   Influenza, High Dose Seasonal PF 09/26/2015, 10/07/2016   Influenza, Seasonal, Injecte, Preservative Fre 09/23/2018   Influenza,inj,Quad PF,6+ Mos 11/03/2016, 09/20/2017   Influenza-Unspecified 09/26/2014   PFIZER SARS-COV-2 Vaccination 02/02/2019, 02/22/2019   Pneumococcal Conjugate-13 06/15/2014   Pneumococcal Polysaccharide-23 03/15/2002, 07/10/2015  Td 08/27/2017   Tdap 04/04/2010   Zoster 05/15/2015   Zoster Recombinat (Shingrix) 03/29/2016   Plan No recommendation  Medication Management   Receives prescription medications from:  [place], [place] - [address] [place]  [address] Phone:  [phone] Fax: [phone]  Denies any issues with current medication management.   Plan  Continue current medication management strategy.  Follow up: 2 month phone visit.  ______________ Visit Information SDOH (Social Determinants of Health) assessments performed: Yes.  Mr. Bencomo was given information about Chronic Care Management services today including:  1. CCM service includes personalized support from designated clinical staff supervised by his physician, including individualized plan of care and coordination with other care providers 2. 24/7 contact phone numbers for assistance for urgent and routine care needs. 3. Standard insurance, coinsurance, copays and deductibles apply for chronic care management only during months in which we provide at least 20 minutes of these services. Most insurances cover these services at 100%, however patients may be responsible for any copay, coinsurance and/or deductible if applicable. This service may help you avoid the need for more expensive face-to-face services. 4. Only one practitioner may furnish and bill the service in a calendar month. 5. The patient may stop CCM services at any time (effective at the end of the month) by phone call to the office staff.  Patient agreed to services and verbal consent obtained.   [name], Pharm.D., BCGP Clinical Pharmacist [facility] [phone]

## 2019-06-23 ENCOUNTER — Other Ambulatory Visit: Payer: Self-pay

## 2019-06-23 ENCOUNTER — Ambulatory Visit: Payer: Medicare Other

## 2019-06-23 DIAGNOSIS — I5022 Chronic systolic (congestive) heart failure: Secondary | ICD-10-CM

## 2019-06-23 DIAGNOSIS — K219 Gastro-esophageal reflux disease without esophagitis: Secondary | ICD-10-CM

## 2019-06-23 DIAGNOSIS — I4891 Unspecified atrial fibrillation: Secondary | ICD-10-CM

## 2019-06-23 DIAGNOSIS — J441 Chronic obstructive pulmonary disease with (acute) exacerbation: Secondary | ICD-10-CM

## 2019-06-23 DIAGNOSIS — I10 Essential (primary) hypertension: Secondary | ICD-10-CM

## 2019-06-23 DIAGNOSIS — E7800 Pure hypercholesterolemia, unspecified: Secondary | ICD-10-CM

## 2019-06-23 DIAGNOSIS — I2510 Atherosclerotic heart disease of native coronary artery without angina pectoris: Secondary | ICD-10-CM

## 2019-06-23 NOTE — Patient Instructions (Addendum)
Please call me at [phone] (direct line) with any questions - thank you!  - Edyth Gunnels., Clinical Pharmacist  Goals Addressed            This Visit's Progress   . PharmD Care Plan       CARE PLAN ENTRY  Current Barriers:  . Chronic Disease Management support, education, and care coordination needs related to Hypertension, Hyperlipidemia, Atrial Fibrillation, Heart Failure, and COPD   Hypertension / Heart Failure . Pharmacist Clinical Goal(s): o Over the next 180 days, patient will work with PharmD and providers to maintain BP goal <130/80 . Current regimen:  . Entresto 24-26 twice daily  . Coreg 6.25 mg twice daily  . Eplerenone 25 mg daily . Furosemide 80 mg daily . Klor-con 20 meq daily  . Interventions: o Continue current management . Patient self care activities - Over the next 180 days, patient will: o Check BP at least once every 1-2 days, document, and provide at future appointments o Ensure daily salt intake < 2300 mg/day  Hyperlipidemia . Pharmacist Clinical Goal(s): o Over the next 180 days, patient will work with PharmD and providers to maintain LDL goal < 70 . Current regimen:  o Rosuvastatin 40 mg once daily . Interventions: o Continue current management . Patient self care activities - Over the next 180 days, patient will: o Continue current management  Afib . Pharmacist Clinical Goal(s) o Over the next 180 days, patient will work with PharmD and providers to minimize symptoms of afib and maintain rate/rhythm control. . Current regimen:  o Xarelto 20 mg daily with supper o Mexitil 150 mg caps - two caps twice daily  . Interventions: o Continue current management . Patient self care activities - Over the next 180 days, patient will: o Continue current management  Medication management . Pharmacist Clinical Goal(s): o Over the next 180 days, patient will work with PharmD and providers to maintain optimal medication adherence . Current pharmacy:  [facility] . Interventions o Comprehensive medication review performed. o Continue current medication management strategy . Patient self care activities - Over the next 180 days, patient will: o Focus on medication adherence by continuing current medication management o Take medications as prescribed o Report any questions or concerns to PharmD and/or provider(s) COPD . Pharmacist Clinical Goal(s) o Over the next 180 days, patient will work with PharmD and providers to minimize COPD symptoms . Current regimen:  . Symbicort 160-4.5 two puff twice daily . Ventolin 1-2 puffs every 4 hours as needed . Interventions: o Continue current management . Patient self care activities - Over the next 180 days, patient will: o Continue current management Initial goal documentation.      [name] was given information about Chronic Care Management services today including:  1. CCM service includes personalized support from designated clinical staff supervised by his physician, including individualized plan of care and coordination with other care providers 2. 24/7 contact phone numbers for assistance for urgent and routine care needs. 3. Standard insurance, coinsurance, copays and deductibles apply for chronic care management only during months in which we provide at least 20 minutes of these services. Most insurances cover these services at 100%, however patients may be responsible for any copay, coinsurance and/or deductible if applicable. This service may help you avoid the need for more expensive face-to-face services. 4. Only one practitioner may furnish and bill the service in a calendar month. 5. The patient may stop CCM services at any time (effective at the  end of the month) by phone call to the office staff.  Patient agreed to services and verbal consent obtained.   The patient verbalized understanding of instructions provided today and agreed to receive a mailed copy of patient instruction  and/or educational materials. Telephone follow up appointment with pharmacy team member scheduled for: See next appointment with "Care Management Staff" under "What's Next" below.   Thank you!  [name], Pharm.D., BCGP Clinical Pharmacist [facility] [phone]  Hypertension, Adult High blood pressure (hypertension) is when the force of blood pumping through the arteries is too strong. The arteries are the blood vessels that carry blood from the heart throughout the body. Hypertension forces the heart to work harder to pump blood and may cause arteries to become narrow or stiff. Untreated or uncontrolled hypertension can cause a heart attack, heart failure, a stroke, kidney disease, and other problems. A blood pressure reading consists of a higher number over a lower number. Ideally, your blood pressure should be below 120/80. The first ("top") number is called the systolic pressure. It is a measure of the pressure in your arteries as your heart beats. The second ("bottom") number is called the diastolic pressure. It is a measure of the pressure in your arteries as the heart relaxes. What are the causes? The exact cause of this condition is not known. There are some conditions that result in or are related to high blood pressure. What increases the risk? Some risk factors for high blood pressure are under your control. The following factors may make you more likely to develop this condition:  Smoking.  Having type 2 diabetes mellitus, high cholesterol, or both.  Not getting enough exercise or physical activity.  Being overweight.  Having too much fat, sugar, calories, or salt (sodium) in your diet.  Drinking too much alcohol. Some risk factors for high blood pressure may be difficult or impossible to change. Some of these factors include:  Having chronic kidney disease.  Having a family history of high blood pressure.  Age. Risk increases with  age.  Race. You may be at higher risk if you are African American.  Gender. Men are at higher risk than women before age 53. After age 51, women are at higher risk than men.  Having obstructive sleep apnea.  Stress. What are the signs or symptoms? High blood pressure may not cause symptoms. Very high blood pressure (hypertensive crisis) may cause:  Headache.  Anxiety.  Shortness of breath.  Nosebleed.  Nausea and vomiting.  Vision changes.  Severe chest pain.  Seizures. How is this diagnosed? This condition is diagnosed by measuring your blood pressure while you are seated, with your arm resting on a flat surface, your legs uncrossed, and your feet flat on the floor. The cuff of the blood pressure monitor will be placed directly against the skin of your upper arm at the level of your heart. It should be measured at least twice using the same arm. Certain conditions can cause a difference in blood pressure between your right and left arms. Certain factors can cause blood pressure readings to be lower or higher than normal for a short period of time:  When your blood pressure is higher when you are in a health care provider's office than when you are at home, this is called white coat hypertension. Most people with this condition do not need medicines.  When your blood pressure is higher at home than when you are in  a health care provider's office, this is called masked hypertension. Most people with this condition may need medicines to control blood pressure. If you have a high blood pressure reading during one visit or you have normal blood pressure with other risk factors, you may be asked to:  Return on a different day to have your blood pressure checked again.  Monitor your blood pressure at home for 1 week or longer. If you are diagnosed with hypertension, you may have other blood or imaging tests to help your health care provider understand your overall risk for other  conditions. How is this treated? This condition is treated by making healthy lifestyle changes, such as eating healthy foods, exercising more, and reducing your alcohol intake. Your health care provider may prescribe medicine if lifestyle changes are not enough to get your blood pressure under control, and if:  Your systolic blood pressure is above 130.  Your diastolic blood pressure is above 80. Your personal target blood pressure may vary depending on your medical conditions, your age, and other factors. Follow these instructions at home: Eating and drinking   Eat a diet that is high in fiber and potassium, and low in sodium, added sugar, and fat. An example eating plan is called the DASH (Dietary Approaches to Stop Hypertension) diet. To eat this way: ? Eat plenty of fresh fruits and vegetables. Try to fill one half of your plate at each meal with fruits and vegetables. ? Eat whole grains, such as whole-wheat pasta, brown rice, or whole-grain bread. Fill about one fourth of your plate with whole grains. ? Eat or drink low-fat dairy products, such as skim milk or low-fat yogurt. ? Avoid fatty cuts of meat, processed or cured meats, and poultry with skin. Fill about one fourth of your plate with lean proteins, such as fish, chicken without skin, beans, eggs, or tofu. ? Avoid pre-made and processed foods. These tend to be higher in sodium, added sugar, and fat.  Reduce your daily sodium intake. Most people with hypertension should eat less than 1,500 mg of sodium a day.  Do not drink alcohol if: ? Your health care provider tells you not to drink. ? You are pregnant, may be pregnant, or are planning to become pregnant.  If you drink alcohol: ? Limit how much you use to:  0-1 drink a day for women.  0-2 drinks a day for men. ? Be aware of how much alcohol is in your drink. In the [place] one drink equals one 12 oz bottle of beer (355 mL), one 5 oz glass of wine (148 mL), or one 1 oz glass  of hard liquor (44 mL). Lifestyle   Work with your health care provider to maintain a healthy body weight or to lose weight. Ask what an ideal weight is for you.  Get at least 30 minutes of exercise most days of the week. Activities may include walking, swimming, or biking.  Include exercise to strengthen your muscles (resistance exercise), such as Pilates or lifting weights, as part of your weekly exercise routine. Try to do these types of exercises for 30 minutes at least 3 days a week.  Do not use any products that contain nicotine or tobacco, such as cigarettes, e-cigarettes, and chewing tobacco. If you need help quitting, ask your health care provider.  Monitor your blood pressure at home as told by your health care provider.  Keep all follow-up visits as told by your health care provider. This is important. Medicines  Take over-the-counter and prescription medicines only as told by your health care provider. Follow directions carefully. Blood pressure medicines must be taken as prescribed.  Do not skip doses of blood pressure medicine. Doing this puts you at risk for problems and can make the medicine less effective.  Ask your health care provider about side effects or reactions to medicines that you should watch for. Contact a health care provider if you:  Think you are having a reaction to a medicine you are taking.  Have headaches that keep coming back (recurring).  Feel dizzy.  Have swelling in your ankles.  Have trouble with your vision. Get help right away if you:  Develop a severe headache or confusion.  Have unusual weakness or numbness.  Feel faint.  Have severe pain in your chest or abdomen.  Vomit repeatedly.  Have trouble breathing. Summary  Hypertension is when the force of blood pumping through your arteries is too strong. If this condition is not controlled, it may put you at risk for serious complications.  Your personal target blood pressure  may vary depending on your medical conditions, your age, and other factors. For most people, a normal blood pressure is less than 120/80.  Hypertension is treated with lifestyle changes, medicines, or a combination of both. Lifestyle changes include losing weight, eating a healthy, low-sodium diet, exercising more, and limiting alcohol. This information is not intended to replace advice given to you by your health care provider. Make sure you discuss any questions you have with your health care provider. Document Revised: 09/10/2017 Document Reviewed: 09/10/2017 Elsevier Patient Education  2020 [employer].

## 2019-06-30 ENCOUNTER — Ambulatory Visit (INDEPENDENT_AMBULATORY_CARE_PROVIDER_SITE_OTHER): Payer: Medicare Other | Admitting: *Deleted

## 2019-06-30 DIAGNOSIS — I4729 Other ventricular tachycardia: Secondary | ICD-10-CM

## 2019-06-30 DIAGNOSIS — I472 Ventricular tachycardia: Secondary | ICD-10-CM

## 2019-06-30 DIAGNOSIS — I255 Ischemic cardiomyopathy: Secondary | ICD-10-CM

## 2019-06-30 LAB — CUP PACEART REMOTE DEVICE CHECK
Battery Remaining Longevity: 10 mo
Battery Remaining Percentage: 15 %
Brady Statistic RA Percent Paced: 88 %
Brady Statistic RV Percent Paced: 98 %
Date Time Interrogation Session: 20210616030100
HighPow Impedance: 54 Ohm
Implantable Lead Implant Date: 20020527
Implantable Lead Implant Date: 20020527
Implantable Lead Implant Date: 20131212
Implantable Lead Location: 753858
Implantable Lead Location: 753859
Implantable Lead Location: 753860
Implantable Lead Model: 148
Implantable Lead Model: 4194
Implantable Lead Model: 6940
Implantable Lead Serial Number: 120244
Implantable Pulse Generator Implant Date: 20111207
Lead Channel Impedance Value: 545 Ohm
Lead Channel Impedance Value: 617 Ohm
Lead Channel Impedance Value: 971 Ohm
Lead Channel Pacing Threshold Amplitude: 0.6 V
Lead Channel Pacing Threshold Amplitude: 1 V
Lead Channel Pacing Threshold Amplitude: 1.7 V
Lead Channel Pacing Threshold Pulse Width: 0.3 ms
Lead Channel Pacing Threshold Pulse Width: 0.4 ms
Lead Channel Pacing Threshold Pulse Width: 1 ms
Lead Channel Setting Pacing Amplitude: 2 V
Lead Channel Setting Pacing Amplitude: 2.5 V
Lead Channel Setting Pacing Amplitude: 2.5 V
Lead Channel Setting Pacing Pulse Width: 0.4 ms
Lead Channel Setting Pacing Pulse Width: 1 ms
Lead Channel Setting Sensing Sensitivity: 0.5 mV
Lead Channel Setting Sensing Sensitivity: 1 mV
Pulse Gen Serial Number: 492371

## 2019-07-01 ENCOUNTER — Telehealth: Payer: Self-pay

## 2019-07-01 NOTE — Progress Notes (Signed)
Remote ICD transmission.   

## 2019-07-01 NOTE — Telephone Encounter (Signed)
Spoke with pt advised ICD battery is estimated 11 months until ERI.  Updating frequency of checks to monthly for closer monitoring.

## 2019-07-02 ENCOUNTER — Ambulatory Visit (HOSPITAL_BASED_OUTPATIENT_CLINIC_OR_DEPARTMENT_OTHER)
Admission: RE | Admit: 2019-07-02 | Discharge: 2019-07-02 | Disposition: A | Discharge status: Home / Self Care | Payer: Medicare Other | Source: Ambulatory Visit | Attending: Cardiology | Admitting: Cardiology

## 2019-07-02 ENCOUNTER — Other Ambulatory Visit: Payer: Self-pay

## 2019-07-02 DIAGNOSIS — I5022 Chronic systolic (congestive) heart failure: Secondary | ICD-10-CM | POA: Diagnosis not present

## 2019-07-02 DIAGNOSIS — I480 Paroxysmal atrial fibrillation: Secondary | ICD-10-CM

## 2019-07-02 DIAGNOSIS — I429 Cardiomyopathy, unspecified: Secondary | ICD-10-CM | POA: Diagnosis not present

## 2019-07-02 DIAGNOSIS — I059 Rheumatic mitral valve disease, unspecified: Secondary | ICD-10-CM | POA: Diagnosis not present

## 2019-07-14 ENCOUNTER — Other Ambulatory Visit: Payer: Self-pay

## 2019-07-14 MED ORDER — RIVAROXABAN 20 MG PO TABS: 20.0000 mg | ORAL_TABLET | Freq: Every day | ORAL | 3 refills | Status: DC

## 2019-07-21 ENCOUNTER — Other Ambulatory Visit: Payer: Self-pay

## 2019-07-21 DIAGNOSIS — I059 Rheumatic mitral valve disease, unspecified: Secondary | ICD-10-CM

## 2019-07-21 DIAGNOSIS — I429 Cardiomyopathy, unspecified: Secondary | ICD-10-CM

## 2019-07-21 DIAGNOSIS — I4891 Unspecified atrial fibrillation: Secondary | ICD-10-CM

## 2019-07-21 DIAGNOSIS — Z95810 Presence of automatic (implantable) cardiac defibrillator: Secondary | ICD-10-CM

## 2019-07-21 DIAGNOSIS — I480 Paroxysmal atrial fibrillation: Secondary | ICD-10-CM

## 2019-07-21 MED ORDER — EPLERENONE 25 MG PO TABS: 25.0000 mg | ORAL_TABLET | Freq: Every day | ORAL | 11 refills | Status: DC

## 2019-07-21 NOTE — Telephone Encounter (Signed)
Rx(s) sent to pharmacy electronically.

## 2019-08-02 ENCOUNTER — Ambulatory Visit (INDEPENDENT_AMBULATORY_CARE_PROVIDER_SITE_OTHER): Payer: Medicare Other | Admitting: *Deleted

## 2019-08-02 DIAGNOSIS — I4729 Other ventricular tachycardia: Secondary | ICD-10-CM

## 2019-08-02 DIAGNOSIS — I472 Ventricular tachycardia, unspecified: Secondary | ICD-10-CM

## 2019-08-02 LAB — CUP PACEART REMOTE DEVICE CHECK
Battery Remaining Longevity: 10 mo
Battery Remaining Percentage: 15 %
Brady Statistic RA Percent Paced: 88 %
Brady Statistic RV Percent Paced: 98 %
Date Time Interrogation Session: 20210719030100
HighPow Impedance: 48 Ohm
Implantable Lead Implant Date: 20020527
Implantable Lead Implant Date: 20020527
Implantable Lead Implant Date: 20131212
Implantable Lead Location: 753858
Implantable Lead Location: 753859
Implantable Lead Location: 753860
Implantable Lead Model: 148
Implantable Lead Model: 4194
Implantable Lead Model: 6940
Implantable Lead Serial Number: 120244
Implantable Pulse Generator Implant Date: 20111207
Lead Channel Impedance Value: 522 Ohm
Lead Channel Impedance Value: 591 Ohm
Lead Channel Impedance Value: 860 Ohm
Lead Channel Pacing Threshold Amplitude: 0.6 V
Lead Channel Pacing Threshold Amplitude: 1 V
Lead Channel Pacing Threshold Amplitude: 1.7 V
Lead Channel Pacing Threshold Pulse Width: 0.3 ms
Lead Channel Pacing Threshold Pulse Width: 0.4 ms
Lead Channel Pacing Threshold Pulse Width: 1 ms
Lead Channel Setting Pacing Amplitude: 2 V
Lead Channel Setting Pacing Amplitude: 2.5 V
Lead Channel Setting Pacing Amplitude: 2.5 V
Lead Channel Setting Pacing Pulse Width: 0.4 ms
Lead Channel Setting Pacing Pulse Width: 1 ms
Lead Channel Setting Sensing Sensitivity: 0.5 mV
Lead Channel Setting Sensing Sensitivity: 1 mV
Pulse Gen Serial Number: 492371

## 2019-08-04 NOTE — Progress Notes (Signed)
Remote ICD transmission.

## 2019-08-06 ENCOUNTER — Other Ambulatory Visit: Payer: Self-pay | Admitting: Cardiology

## 2019-08-09 ENCOUNTER — Ambulatory Visit (INDEPENDENT_AMBULATORY_CARE_PROVIDER_SITE_OTHER): Payer: Medicare Other

## 2019-08-09 ENCOUNTER — Other Ambulatory Visit: Payer: Self-pay

## 2019-08-09 DIAGNOSIS — E7800 Pure hypercholesterolemia, unspecified: Secondary | ICD-10-CM | POA: Diagnosis not present

## 2019-08-09 LAB — LIPID PANEL
Cholesterol: 130 mg/dL (ref 0–200)
HDL: 30.3 mg/dL — ABNORMAL LOW (ref >39.00)
LDL Cholesterol: 62 mg/dL (ref 0–99)
NonHDL: 99.99
Total CHOL/HDL Ratio: 4
Triglycerides: 191 mg/dL — ABNORMAL HIGH (ref 0.0–149.0)
VLDL: 38.2 mg/dL (ref 0.0–40.0)

## 2019-08-09 LAB — HEPATIC FUNCTION PANEL
ALT: 18 U/L (ref 0–53)
AST: 19 U/L (ref 0–37)
Albumin: 3.8 g/dL (ref 3.5–5.2)
Alkaline Phosphatase: 55 U/L (ref 39–117)
Bilirubin, Direct: 0.1 mg/dL (ref 0.0–0.3)
Total Bilirubin: 0.5 mg/dL (ref 0.2–1.2)
Total Protein: 6.2 g/dL (ref 6.0–8.3)

## 2019-08-10 ENCOUNTER — Encounter: Payer: Self-pay | Admitting: *Deleted

## 2019-08-12 ENCOUNTER — Other Ambulatory Visit: Payer: Self-pay

## 2019-08-12 ENCOUNTER — Ambulatory Visit (INDEPENDENT_AMBULATORY_CARE_PROVIDER_SITE_OTHER): Payer: Medicare Other | Admitting: Family Medicine

## 2019-08-12 ENCOUNTER — Encounter: Payer: Self-pay | Admitting: Family Medicine

## 2019-08-12 VITALS — BP 119/76 | HR 74 | Temp 97.9°F | Resp 16 | Ht 70.0 in | Wt 212.0 lb

## 2019-08-12 DIAGNOSIS — I255 Ischemic cardiomyopathy: Secondary | ICD-10-CM | POA: Diagnosis not present

## 2019-08-12 DIAGNOSIS — S51011A Laceration without foreign body of right elbow, initial encounter: Secondary | ICD-10-CM | POA: Diagnosis not present

## 2019-08-12 DIAGNOSIS — S8001XA Contusion of right knee, initial encounter: Secondary | ICD-10-CM | POA: Diagnosis not present

## 2019-08-12 NOTE — Progress Notes (Signed)
   Subjective:    Patient ID: [name], male    DOB: 1940/09/29, 79 y.o.   MRN: [id number]  HPI Fall/wounds- pt was trying to replace rotten floor boards at his hunting [place] yesterday and fell thru the floor.  UTD on Td.  R elbow wound and abrasion of R knee.  Pt is on xarelto.  Did not hit head.  Wife is retired Marine scientist and bandaged him yesterday after cleaning the wound.   Review of Systems For ROS see HPI   This visit occurred during the SARS-CoV-2 public health emergency.  Safety protocols were in place, including screening questions prior to the visit, additional usage of staff PPE, and extensive cleaning of exam room while observing appropriate contact time as indicated for disinfecting solutions.       Objective:   Physical Exam Vitals reviewed.  Constitutional:      General: He is not in acute distress.    Appearance: Normal appearance. He is not ill-appearing or toxic-appearing.  HENT:     Head: Normocephalic and atraumatic.  Eyes:     Extraocular Movements: Extraocular movements intact.     Pupils: Pupils are equal, round, and reactive to light.  Musculoskeletal:        General: Signs of injury (developing ecchymosis of R medial knee) present.  Skin:    General: Skin is warm and dry.     Comments: Skin tear of R elbow w/ clean base and no surrounding erythema or induration  Neurological:     General: No focal deficit present.     Mental Status: He is alert and oriented to person, place, and time.  Psychiatric:        Mood and Affect: Mood normal.        Behavior: Behavior normal.        Thought Content: Thought content normal.           Assessment & Plan:  Skin tear- new.  No current signs of infxn.  Pt is UTD on Td.  No need for abx at this time as wound is clean.  Reviewed wound care and he was bandaged in office.  Reviewed supportive care and red flags that should prompt return.  Pt expressed understanding and is in agreement w/ plan.   Traumatic  ecchymosis- new.  Pt w/ developing bruise of R medial knee w/o evidence of hematoma.  Ice.  Discussed that the bruising may track down his leg due to gravity.  Pt expressed understanding and is in agreement w/ plan.

## 2019-08-12 NOTE — Patient Instructions (Signed)
Follow up as needed Keep elbow clean and dry.  Wash daily w/ soap and water and then pat dry. Apply a very thin layer of bacitracin once daily- do not over apply!  If the wound is too wet, it won't heal ICE the knee to help w/ the bruising Call with any questions or concerns- particularly if you develop redness around the wound Hang in there!

## 2019-08-17 ENCOUNTER — Ambulatory Visit: Payer: Medicare Other

## 2019-09-01 ENCOUNTER — Telehealth: Payer: Medicare Other

## 2019-09-02 ENCOUNTER — Ambulatory Visit (INDEPENDENT_AMBULATORY_CARE_PROVIDER_SITE_OTHER): Payer: Medicare Other | Admitting: *Deleted

## 2019-09-02 DIAGNOSIS — I472 Ventricular tachycardia, unspecified: Secondary | ICD-10-CM

## 2019-09-02 DIAGNOSIS — I4729 Other ventricular tachycardia: Secondary | ICD-10-CM

## 2019-09-02 LAB — CUP PACEART REMOTE DEVICE CHECK
Battery Remaining Longevity: 7 mo
Battery Remaining Percentage: 10 %
Brady Statistic RA Percent Paced: 87 %
Brady Statistic RV Percent Paced: 98 %
Date Time Interrogation Session: 20210819031200
HighPow Impedance: 51 Ohm
Implantable Lead Implant Date: 20020527
Implantable Lead Implant Date: 20020527
Implantable Lead Implant Date: 20131212
Implantable Lead Location: 753858
Implantable Lead Location: 753859
Implantable Lead Location: 753860
Implantable Lead Model: 148
Implantable Lead Model: 4194
Implantable Lead Model: 6940
Implantable Lead Serial Number: 120244
Implantable Pulse Generator Implant Date: 20111207
Lead Channel Impedance Value: 540 Ohm
Lead Channel Impedance Value: 595 Ohm
Lead Channel Impedance Value: 905 Ohm
Lead Channel Pacing Threshold Amplitude: 0.6 V
Lead Channel Pacing Threshold Amplitude: 1 V
Lead Channel Pacing Threshold Amplitude: 1.7 V
Lead Channel Pacing Threshold Pulse Width: 0.3 ms
Lead Channel Pacing Threshold Pulse Width: 0.4 ms
Lead Channel Pacing Threshold Pulse Width: 1 ms
Lead Channel Setting Pacing Amplitude: 2 V
Lead Channel Setting Pacing Amplitude: 2.5 V
Lead Channel Setting Pacing Amplitude: 2.5 V
Lead Channel Setting Pacing Pulse Width: 0.4 ms
Lead Channel Setting Pacing Pulse Width: 1 ms
Lead Channel Setting Sensing Sensitivity: 0.5 mV
Lead Channel Setting Sensing Sensitivity: 1 mV
Pulse Gen Serial Number: 492371

## 2019-09-03 ENCOUNTER — Ambulatory Visit: Payer: Medicare Other

## 2019-09-03 NOTE — Progress Notes (Signed)
Remote ICD transmission.   

## 2019-09-03 NOTE — Progress Notes (Deleted)
Chronic Care Management Pharmacy  Name: [name]  MRN: [id number] DOB: 07/06/1940  Chief Complaint/ HPI  [name],  79 y.o. , male presents for their Initial CCM visit with the clinical pharmacist via telephone due to COVID-19 Pandemic. Reports no major concerns today and feels chronic conditions such as COPD and heart failure have been very well managed. States previous feeling of dizziness has now resolved after reducing Coreg dose earlier this month. Still is able to do the everything he loves both indoors and outdoors. Physical activity such as yard work is limited to 25-30 minutes then goes inside 10-15 minutes to recharge. Is cutting down on Atrovent due to dry mouth and has now increased fluticasone nasal spray to two sprays each nostril twice daily. Recent crestor increase to 40 mg and is no longer taking zetia.    PCP : [name], MD.   Chronic conditions include:  No diagnosis found.  Office Visits 08/12/2019 (PCP): Fall/wounds- pt was trying to replace rotten floor boards at his hunting place yesterday and fell thru the floor.  UTD on Td.  R elbow wound and abrasion of R knee.  Pt is on xarelto.  Did not hit head. Bacitracin once daily.  06/16/2019 (Dr. Stanford Breed, cardiology): Coreg dose halved to 6.25 mg twice daily, rosuvastatin increased to 40 mg daily   Patient Active Problem List   Diagnosis Date Noted  . Encounter for follow-up surveillance of colon cancer   . Benign neoplasm of descending colon   . Pulmonary nodules 05/18/2015  . Rectal bleeding 03/29/2015  . Internal hemorrhoid, bleeding 03/29/2015  . COPD (chronic obstructive pulmonary disease) ([employer]) 03/28/2015  . Interstitial lung disease ([place]) 03/28/2015  . Gastroesophageal reflux disease 03/17/2015  . Prostate cancer screening 03/09/2014  . Thoracic aortic aneurysm ([place]) 09/28/2013  . Encounter for therapeutic drug monitoring 03/03/2013  . Constipation, chronic 10/21/2012  . Umbilical hernia with  intermittent pain 10/21/2012  . Obesity (BMI 30-39.9) 10/21/2012  . TIA (transient ischemic attack) 09/25/2012  . Other malaise and fatigue 08/25/2012  . Vitamin D deficiency 07/28/2012  . CHF (congestive heart failure) ([place]) 07/28/2012  . Chronotropic incompetence 10/25/2011  . History of non anemic vitamin B12 deficiency 05/29/2011  . Mild cognitive impairment 01/13/2011  . Ringing in ears 01/13/2011  . CAD (coronary artery disease) 09/14/2010  . Elevated transaminase level--on amiodarone 06/19/2010  . Biventricular implantable cardioverter-defibrillator in situ 05/16/2010  . Long term (current) use of anticoagulants 04/11/2010  . Microscopic hematuria 04/04/2010  . CLAUDICATION 11/28/2009  . Atrial fibrillation ([place]) 06/29/2009  . Ischemic cardiomyopathy 07/07/2008  . VENTRICULAR TACHYCARDIA 06/29/2008  . Hyperlipidemia 01/04/2008  . Essential hypertension 01/04/2008  . Chronic rhinitis 03/04/2007  . Obstructive sleep apnea 03/02/2007   Past Surgical History:  Procedure Laterality Date  . ATRIAL ABLATION SURGERY     flutter ablation 2002  . BREAST SURGERY Left 1990   "Tissue removed from breast"  . CARDIAC CATHETERIZATION    . carotid doppler  09/25/12   No signif stenosis  . COLONOSCOPY N/A 07/13/2012   Diverticula ascending colon.  Polyp x 1-recall 5 yrs (Dr. Watt Climes) Procedure: COLONOSCOPY;  Surgeon: Jeryl Columbia, MD;  Location: [facility];  Service: Endoscopy;  Laterality: N/A; polypectomy x 1   . COLONOSCOPY WITH PROPOFOL N/A 08/18/2015   Procedure: COLONOSCOPY WITH PROPOFOL;  Surgeon: Mauri Pole, MD;  Location: [facility];  Service: Endoscopy;  Laterality: N/A;  . ESOPHAGOGASTRODUODENOSCOPY N/A 07/13/2012   Procedure: ESOPHAGOGASTRODUODENOSCOPY (EGD);  Surgeon: Jeryl Columbia, MD;  Location: [facility];  Service: Endoscopy;  Laterality: N/A;  NORMAL  . EXTRACORPOREAL SHOCK WAVE LITHOTRIPSY    . HEMORRHOID BANDING    . ICD     a) Guidant Contak H170- Virl Axe, MD 07/25/06 (second device) b) Medtronic CRT-D  . INGUINAL HERNIA REPAIR Bilateral 1975  . LEAD REVISION Bilateral 12/26/2011   Procedure: LEAD REVISION;  Surgeon: Evans Lance, MD;  Location: [facility] CATH LAB;  Service: Cardiovascular;  Laterality: Bilateral;  . PFTs  02/2015   Mod restriction (interstitial), mod diffusion defect (Dr. Annamaria Boots)  . precancer area keratsis removed  05/2015    leftf orehead and left arm and right arm and back   Social History   Socioeconomic History  . Marital status: Married    Spouse name: Not on file  . Number of children: 3  . Years of education: Not on file  . Highest education level: Not on file  Occupational History  . Occupation: Retired    Fish farm manager: RETIRED  Tobacco Use  . Smoking status: Former Smoker    Packs/day: 2.00    Years: 15.00    Pack years: 30.00    Types: Cigarettes    Quit date: 01/15/1980    Years since quitting: 39.6  . Smokeless tobacco: Never Used  Vaping Use  . Vaping Use: Never used  Substance and Sexual Activity  . Alcohol use: No    Alcohol/week: 0.0 standard drinks  . Drug use: No  . Sexual activity: Not on file  Other Topics Concern  . Not on file  Social History Narrative   Married.   Hx of cigarettes: quit in the 1980s.  No alc/drugs.   Social Determinants of Health   Financial Resource Strain:   . Difficulty of Paying Living Expenses: Not on file  Food Insecurity: No Food Insecurity  . Worried About Charity fundraiser in the Last Year: Never true  . Ran Out of Food in the Last Year: Never true  Transportation Needs: No Transportation Needs  . Lack of Transportation (Medical): No  . Lack of Transportation (Non-Medical): No  Physical Activity:   . Days of Exercise per Week: Not on file  . Minutes of Exercise per Session: Not on file  Stress:   . Feeling of Stress : Not on file  Social Connections:   . Frequency of Communication with Friends and Family: Not on file  . Frequency of Social  Gatherings with Friends and Family: Not on file  . Attends Religious Services: Not on file  . Active Member of Clubs or Organizations: Not on file  . Attends Archivist Meetings: Not on file  . Marital Status: Not on file   Family History  Problem Relation Age of Onset  . Heart disease Mother   . Other Mother        colon surgery for fistula  . Gallstones Mother   . Breast cancer Maternal Grandmother   . Coronary artery disease Father        family hx of  . Heart attack Father        4 stents/ pacemaker  . Nephrolithiasis Father   . Hypertension Sister   . Goiter Paternal Grandmother   . Nephrolithiasis Son   . Colon cancer Neg Hx    Allergies  Allergen Reactions  . Sulfa Antibiotics Rash  . Crestor [Rosuvastatin] Rash   Outpatient Encounter Medications as of 09/03/2019  Medication Sig  .  antiseptic oral rinse (BIOTENE) LIQD 15 mLs by Mouth Rinse route as needed for dry mouth (TID).  [name] budesonide-formoterol (SYMBICORT) 160-4.5 MCG/ACT inhaler Inhale 2 puffs into the lungs 2 (two) times daily.  . carvedilol (COREG) 12.5 MG tablet TAKE 1 TABLET(12.5 MG) BY MOUTH TWICE DAILY WITH A MEAL  . carvedilol (COREG) 6.25 MG tablet Take 1 tablet (6.25 mg total) by mouth 2 (two) times daily with a meal.  . cholecalciferol (VITAMIN D) 1000 units tablet Take 1,000 Units by mouth daily.  [name] eplerenone (INSPRA) 25 MG tablet Take 1 tablet (25 mg total) by mouth daily.  . fluticasone (FLONASE) 50 MCG/ACT nasal spray SHAKE LIQUID AND USE 2 SPRAYS IN EACH NOSTRIL TWICE DAILY  . furosemide (LASIX) 40 MG tablet TAKE 2 TABLETS BY MOUTH EVERY DAY  . ipratropium (ATROVENT) 0.06 % nasal spray USE 2 SPRAYS IN EACH NOSTRIL THREE TIMES DAILY  . loratadine (CLARITIN) 10 MG tablet Take 10 mg by mouth daily.  . Magnesium 250 MG TABS Take 1 tablet by mouth daily.  [name] mexiletine (MEXITIL) 150 MG capsule TAKE 2 CAPSULES(300 MG) BY MOUTH TWICE DAILY  . Multiple Vitamin (MULTIVITAMIN WITH MINERALS) TABS  Take 1 tablet by mouth daily.  . nitroGLYCERIN (NITROSTAT) 0.4 MG SL tablet DISSOLVE 1 TABLET UNDER THE TONGUE EVERY 5 MINUTES AS NEEDED FOR CHEST PAIN  . polyvinyl alcohol-povidone (REFRESH) 1.4-0.6 % ophthalmic solution Place 1-2 drops into both eyes as needed (dry eyes).   . potassium chloride SA (KLOR-CON) 20 MEQ tablet TAKE 1 TABLET(20 MEQ) BY MOUTH DAILY  . Probiotic Product (PROBIOTIC DAILY PO) Take 1 tablet by mouth daily.  . rivaroxaban (XARELTO) 20 MG TABS tablet Take 1 tablet (20 mg total) by mouth daily with supper.  . rosuvastatin (CRESTOR) 40 MG tablet Take 1 tablet (40 mg total) by mouth daily.  . sacubitril-valsartan (ENTRESTO) 24-26 MG Take 1 tablet by mouth 2 (two) times daily.  . VENTOLIN HFA 108 (90 Base) MCG/ACT inhaler INHALE 1 TO 2 PUFFS INTO THE LUNGS EVERY 4 HOURS AS NEEDED FOR WHEEZING OR SHORTNESS OF BREATH  . Wheat Dextrin (BENEFIBER PO) Take 2 Scoops by mouth 2 (two) times daily.   No facility-administered encounter medications on file as of 09/03/2019.   Patient Care Team    Relationship Specialty Notifications Start End  [name], MD PCP - General Family Medicine  07/10/15   Festus Aloe, MD Consulting Physician Urology  01/25/12   Michael Boston, MD Consulting Physician General Surgery  10/21/12   [name], MD Consulting Physician Cardiology  07/17/69   Jodi Marble, MD Consulting Physician Otolaryngology  03/25/15   Deboraha Sprang, MD Consulting Physician Cardiology  06/22/15   Luberta Mutter, MD Consulting Physician Ophthalmology  07/10/15   Collene Gobble, MD Consulting Physician Pulmonary Disease  07/10/15   Mauri Pole, MD Consulting Physician Gastroenterology  12/27/15   [name], [facility] Pharmacist Pharmacist  05/06/19    Comment: PHONE NUMBER [phone]   Current Diagnosis/Assessment: Goals Addressed   None    Heart failure    Coreg has been reduced to 6.25 mg twice daily from 12.5 mg twice daily 06/2019 visit with  cardiologist.  BP Readings from Last 3 Encounters:  08/12/19 119/76  06/16/19 94/60  05/31/19 100/60   CMP Latest Ref Rng & Units 08/09/2019 05/17/2019 10/29/2018  Glucose 70 - 99 mg/dL - 110(H) 118(H)  BUN 6 - 23 mg/dL - 13 14  Creatinine 0.40 - 1.50 mg/dL - 0.86  0.87  Sodium 135 - 145 mEq/L - 135 135  Potassium 3.5 - 5.1 mEq/L - 4.2 4.1  Chloride 96 - 112 mEq/L - 103 103  CO2 19 - 32 mEq/L - 26 24  Calcium 8.4 - 10.5 mg/dL - 8.8 8.8  Total Protein 6.0 - 8.3 g/dL 6.2 6.2 6.4  Total Bilirubin 0.2 - 1.2 mg/dL 0.5 0.7 0.7  Alkaline Phos 39 - 117 U/L 55 59 48  AST 0 - 37 U/L _0 ALT 0 - 53 U/L _1 EF is 44%, grade 1 diastolic dysfunction, mild mitral regurgitation.  BP well controlled at visits. Denies dizziness, SOB, chest pain. Electrolytes stable. Previous dizziness associated with low BP has now resolved with Coreg dose change. Patient is currently controlled on the following medications:  . Entresto 24-26 twice daily  . Coreg 6.25 mg twice daily  . Eplerenone 25 mg daily . Furosemide 80 mg daily . Klor-con 20 meq daily   Plan  Continue current medications and control with diet and exercise    AFIB   Previous PT with amiodarone. Denies any recent SOB or palpitations.   Pulse Readings from Last 3 Encounters:  08/12/19 74  06/16/19 60  05/31/19 60   Patient is currently rate controlled at consistently <70 BPM. Coreg noted in HF a/p. Denies dizziness, SOB, chest pain. Patient is currently controlled on the following medications:  [name] Mexitil 150 mg capsule - two caps twice daily   Stroke prevention in afib. This patients CHA2DS2-VASc Score = 5. On AC with Xarelto. No current issues with accessibility.  Denies any abnormal bruising, bleeding from nose or gums or blood in urine or stool. Patient is currently controlled on the following medications:  . Xarelto 20 mg every day with supper   Plan  Continue current medications.  COPD / Asthma / Tobacco    Exacerbation likely triggered by allergies 05/17/2019 - given amoxicillin. Uses rescue roughly once every other day. Has not had any additional exacerbation within last year. Last spirometry score: 01/2018.    Ref Range & Units 1 yr ago 2 yr ago 4 yr ago  FVC-Pre L 3.02  2.82 P  3.20   FVC-%Pred-Pre % 69  64 P  71   FVC-Post L 2.97  2.78 P  3.19   FVC-%Pred-Post % 67  63 P  71   FVC-%Change-Post % -1  -1 P  0   FEV1-Pre L 2.48  2.30 P  2.61   FEV1-%Pred-Pre % 78  72 P  80   FEV1-Post L 2.42  2.30 P  2.61   FEV1-%Pred-Post % 76  72 P  79   FEV1-%Change-Post % -2  0 P  0   FEV6-Pre L 3.02  2.80 P  3.20   FEV6-%Pred-Pre % 73  68 P  76   FEV6-Post L 2.97  2.77 P  3.16   FEV6-%Pred-Post % 72  67 P  75   FEV6-%Change-Post % -1  -1 P  -1   Pre FEV1/FVC ratio % 82  82 P  82   FEV1FVC-%Pred-Pre % 113  113 P  111   Post FEV1/FVC ratio % 82  83 P  82   FEV1FVC-%Change-Post % 0  1 P  0   Pre FEV6/FVC Ratio % 100  100 P  100   FEV6FVC-%Pred-Pre % 106  106 P  106   Post FEV6/FVC ratio % 100  100 P  99   FEV6FVC-%Pred-Post %  106  106 P  105   FEV6FVC-%Change-Post % 0   0   FEF 25-75 Pre L/sec 2.73  2.49 P  2.68   FEF2575-%Pred-Pre % 120  108 P  111   FEF 25-75 Post L/sec 2.44  2.50 P  2.71   FEF2575-%Pred-Post % 107  108 P  112   FEF2575-%Change-Post % -10  0 P  1   RV L 1.47  1.79 P  1.15   RV % pred % 56  68 P  44   TLC L 4.65  4.94 P  4.59   TLC % pred % 64  68 P  63   DLCO unc ml/min/mmHg 18.10  18.46 P  19.06   DLCO unc % pred % 53  54 P  56   DLCO cor ml/min/mmHg 17.90     DLCO cor % pred % 53     DL/VA ml/min/mmHg/L 4.20  4.45 P  4.23   DL/VA % pred % 90  95 P  90      Lab Results  Component Value Date/Time   EOSPCT 6.9 (H) 05/17/2019 11:05 AM   Lab Results  Component Value Date/Time   EOSABS 0.5 05/17/2019 11:05 AM   Social History   Tobacco Use  Smoking Status Former Smoker  . Packs/day: 2.00  . Years: 15.00  . Pack years: 30.00  . Types: Cigarettes  . Quit date:  01/15/1980  . Years since quitting: 39.6  Smokeless Tobacco Never Used   Rank each of the following items on a scale of 0 to 5 (with 5 being most severe) Write a # 0-5 in each box  I never cough (0) > I cough all the time (5) 3  I have no phlegm (mucus) in my chest (0) > My chest is completely full of phlegm (mucus) (5) 4  My chest does not feel tight at all (0) > My chest feels very tight (5) 2  When I walk up a hill or one flight of stairs I am not breathless (0) > When I walk up a hill or one flight of stairs I am very breathless (5) 3  I am not limited doing any activities at home (0) > I am very limited doing activities at home (5) 1  I am confident leaving my home despite my lung function (0) > I am not at all confident leaving my home because of my lung condition (5)  0  I sleep soundly (0) > I don't sleep soundly because of my lung condition (5) 3  I have lots of energy (0) > I have no energy at all (5) 3   Total = 17   Patient is currently controlled on the following medications:  [name] Symbicort 160-4.5 two puff twice daily . Ventolin 1-2 puffs every 4 hours as needed  Plan  Continue current medications.  Hyperlipidemia   Recently taken off Zetia and Crestor increased to 40 mg from 10 mg. Followed by Dr. Stanford Breed.  Is active with yard work, fishing, walking nearly every day. Has managed to significantly cut down on sugar intake.     Component Value Date/Time   CHOL 130 08/09/2019 1008   TRIG 191.0 (H) 08/09/2019 1008   HDL 30.30 (L) 08/09/2019 1008   LDLCALC 62 08/09/2019 1008   LDLCALC 169 (H) 06/11/2017 1637   LDLDIRECT 168.0 07/12/2015 0928    The 10-year ASCVD risk score Mikey Bussing DC Jr., et al., 2013) is: 29.3%   Values  used to calculate the score:     Age: 62 years     Sex: Male     Is Non-Hispanic African American: No     Diabetic: No     Tobacco smoker: No     Systolic Blood Pressure: 317 mmHg     Is BP treated: Yes     HDL Cholesterol: 30.3 mg/dL     Total  Cholesterol: 130 mg/dL   LDL well controlled. Denies any muscle or abdominal pain or n/v.  Patient is currently controlled on the following medications:  . Crestor 40 mg daily   Plan  Continue current medications   Seasonal allergies   Allergies have not been an issues as long as medications are taken. Dry mouth has improved with reduction in atrovent use. Patient is currently controlled on the following medications:  . Atrovent 2 sprays three times daily . Fluticisone 2 sprays both nares twice daily (16 sprays/day) . Claritin 10 mg daily   Plan  Continue current medications   Vaccines   Reviewed and discussed patient's vaccination history.    Immunization History  Administered Date(s) Administered  . Fluad Quad(high Dose 65+) 09/23/2018  . Influenza Split 11/11/2010, 11/02/2012, 10/14/2013  . Influenza Whole 09/27/2011, 11/13/2011  . Influenza, High Dose Seasonal PF 09/26/2015, 10/07/2016  . Influenza, Seasonal, Injecte, Preservative Fre 09/23/2018  . Influenza,inj,Quad PF,6+ Mos 11/03/2016, 09/20/2017  . Influenza-Unspecified 09/26/2014  . PFIZER SARS-COV-2 Vaccination 02/02/2019, 02/22/2019  . Pneumococcal Conjugate-13 06/15/2014  . Pneumococcal Polysaccharide-23 03/15/2002, 07/10/2015  . Td 08/27/2017  . Tdap 04/04/2010  . Zoster 05/15/2015  . Zoster Recombinat (Shingrix) 03/29/2016   Plan No recommendation  Medication Management   Receives prescription medications from:  [place] [place] - [address] [place] [address] [place] [address] Phone: [phone] Fax: [phone]  Denies any issues with current medication management.   Plan  Continue current medication management strategy.  Follow up: 2 month phone visit.  ______________ Visit Information SDOH (Social Determinants of Health) assessments performed: Yes.  [name] was given information about Chronic Care  Management services today including:  1. CCM service includes personalized support from designated clinical staff supervised by his physician, including individualized plan of care and coordination with other care providers 2. 24/7 contact phone numbers for assistance for urgent and routine care needs. 3. Standard insurance, coinsurance, copays and deductibles apply for chronic care management only during months in which we provide at least 20 minutes of these services. Most insurances cover these services at 100%, however patients may be responsible for any copay, coinsurance and/or deductible if applicable. This service may help you avoid the need for more expensive face-to-face services. 4. Only one practitioner may furnish and bill the service in a calendar month. 5. The patient may stop CCM services at any time (effective at the end of the month) by phone call to the office staff.  Patient agreed to services and verbal consent obtained.   [name], Pharm.D., BCGP Clinical Pharmacist [facility] at [facility] [phone]

## 2019-09-06 ENCOUNTER — Telehealth: Payer: Self-pay

## 2019-09-07 NOTE — Telephone Encounter (Signed)
Erroneous encounter

## 2019-09-29 ENCOUNTER — Ambulatory Visit (INDEPENDENT_AMBULATORY_CARE_PROVIDER_SITE_OTHER): Payer: Medicare Other | Admitting: *Deleted

## 2019-09-29 DIAGNOSIS — I255 Ischemic cardiomyopathy: Secondary | ICD-10-CM

## 2019-09-29 DIAGNOSIS — L218 Other seborrheic dermatitis: Secondary | ICD-10-CM | POA: Diagnosis not present

## 2019-09-29 DIAGNOSIS — L812 Freckles: Secondary | ICD-10-CM | POA: Diagnosis not present

## 2019-09-29 DIAGNOSIS — D225 Melanocytic nevi of trunk: Secondary | ICD-10-CM | POA: Diagnosis not present

## 2019-09-29 DIAGNOSIS — L918 Other hypertrophic disorders of the skin: Secondary | ICD-10-CM | POA: Diagnosis not present

## 2019-09-29 DIAGNOSIS — L821 Other seborrheic keratosis: Secondary | ICD-10-CM | POA: Diagnosis not present

## 2019-09-29 LAB — CUP PACEART REMOTE DEVICE CHECK
Battery Remaining Longevity: 7 mo
Battery Remaining Percentage: 10 %
Brady Statistic RA Percent Paced: 88 %
Brady Statistic RV Percent Paced: 98 %
Date Time Interrogation Session: 20210915030200
HighPow Impedance: 52 Ohm
Implantable Lead Implant Date: 20020527
Implantable Lead Implant Date: 20020527
Implantable Lead Implant Date: 20131212
Implantable Lead Location: 753858
Implantable Lead Location: 753859
Implantable Lead Location: 753860
Implantable Lead Model: 148
Implantable Lead Model: 4194
Implantable Lead Model: 6940
Implantable Lead Serial Number: 120244
Implantable Pulse Generator Implant Date: 20111207
Lead Channel Impedance Value: 545 Ohm
Lead Channel Impedance Value: 626 Ohm
Lead Channel Impedance Value: 947 Ohm
Lead Channel Pacing Threshold Amplitude: 0.6 V
Lead Channel Pacing Threshold Amplitude: 1 V
Lead Channel Pacing Threshold Amplitude: 1.7 V
Lead Channel Pacing Threshold Pulse Width: 0.3 ms
Lead Channel Pacing Threshold Pulse Width: 0.4 ms
Lead Channel Pacing Threshold Pulse Width: 1 ms
Lead Channel Setting Pacing Amplitude: 2 V
Lead Channel Setting Pacing Amplitude: 2.5 V
Lead Channel Setting Pacing Amplitude: 2.5 V
Lead Channel Setting Pacing Pulse Width: 0.4 ms
Lead Channel Setting Pacing Pulse Width: 1 ms
Lead Channel Setting Sensing Sensitivity: 0.5 mV
Lead Channel Setting Sensing Sensitivity: 1 mV
Pulse Gen Serial Number: 492371

## 2019-10-01 ENCOUNTER — Ambulatory Visit: Payer: Medicare Other

## 2019-10-01 ENCOUNTER — Other Ambulatory Visit: Payer: Self-pay

## 2019-10-01 DIAGNOSIS — I4891 Unspecified atrial fibrillation: Secondary | ICD-10-CM

## 2019-10-01 DIAGNOSIS — I10 Essential (primary) hypertension: Secondary | ICD-10-CM

## 2019-10-01 NOTE — Patient Instructions (Addendum)
Please review care plan below and call me at [phone] with any questions!  Thank you, Edyth Gunnels., Clinical Pharmacist  Goals Addressed            This Visit's Progress   . PharmD Care Plan   On track    CARE PLAN ENTRY  Current Barriers:  . Chronic Disease Management support, education, and care coordination needs related to Hypertension, Hyperlipidemia, Atrial Fibrillation, Heart Failure, and COPD   Hypertension / Heart Failure . Pharmacist Clinical Goal(s): o Over the next 180 days, patient will work with PharmD and providers to maintain BP goal <130/80 . Current regimen:  . Entresto 24-26 twice daily  . Coreg 6.25 mg twice daily  . Eplerenone 25 mg daily . Furosemide 80 mg daily . Klor-con 20 meq daily  . Interventions: o Continue current management . Patient self care activities - Over the next 180 days, patient will: o Check BP at least once every 1-2 days, document, and provide at future appointments o Ensure daily salt intake < 2300 mg/day  Hyperlipidemia . Pharmacist Clinical Goal(s): o Over the next 180 days, patient will work with PharmD and providers to maintain LDL goal < 70 . Current regimen:  o Rosuvastatin 40 mg once daily . Interventions: o Continue current management . Patient self care activities - Over the next 180 days, patient will: o Continue current management  Afib . Pharmacist Clinical Goal(s) o Over the next 180 days, patient will work with PharmD and providers to minimize symptoms of afib and maintain rate/rhythm control. . Current regimen:  o Xarelto 20 mg daily with supper o Mexitil 150 mg caps - two caps twice daily  . Interventions: o Continue current management . Patient self care activities - Over the next 180 days, patient will: o Continue current management  Medication management . Pharmacist Clinical Goal(s): o Over the next 180 days, patient will work with PharmD and providers to maintain optimal medication  adherence . Current pharmacy: [facility] . Interventions o Comprehensive medication review performed. o Continue current medication management strategy . Patient self care activities - Over the next 180 days, patient will: o Focus on medication adherence by continuing current medication management o Take medications as prescribed o Report any questions or concerns to PharmD and/or provider(s) COPD . Pharmacist Clinical Goal(s) o Over the next 180 days, patient will work with PharmD and providers to minimize COPD symptoms . Current regimen:  . Symbicort 160-4.5 two puff twice daily . Ventolin 1-2 puffs every 4 hours as needed . Interventions: o Continue current management . Patient self care activities - Over the next 180 days, patient will: o Continue current management Initial goal documentation.      The patient verbalized understanding of instructions provided today and agreed to receive a mailed copy of patient instruction and/or educational materials. Telephone follow up appointment with pharmacy team member scheduled for: See next appointment with "Care Management Staff" under "What's Next" below.   [name], Pharm.D., BCGP Clinical Pharmacist [facility] [phone]  Hypertension, Adult High blood pressure (hypertension) is when the force of blood pumping through the arteries is too strong. The arteries are the blood vessels that carry blood from the heart throughout the body. Hypertension forces the heart to work harder to pump blood and may cause arteries to become narrow or stiff. Untreated or uncontrolled hypertension can cause a heart attack, heart failure, a stroke, kidney disease, and other problems. A blood pressure reading consists of a higher  number over a lower number. Ideally, your blood pressure should be below 120/80. The first ("top") number is called the systolic pressure. It is a measure of the pressure in your arteries as your heart beats. The  second ("bottom") number is called the diastolic pressure. It is a measure of the pressure in your arteries as the heart relaxes. What are the causes? The exact cause of this condition is not known. There are some conditions that result in or are related to high blood pressure. What increases the risk? Some risk factors for high blood pressure are under your control. The following factors may make you more likely to develop this condition:  Smoking.  Having type 2 diabetes mellitus, high cholesterol, or both.  Not getting enough exercise or physical activity.  Being overweight.  Having too much fat, sugar, calories, or salt (sodium) in your diet.  Drinking too much alcohol. Some risk factors for high blood pressure may be difficult or impossible to change. Some of these factors include:  Having chronic kidney disease.  Having a family history of high blood pressure.  Age. Risk increases with age.  Race. You may be at higher risk if you are African American.  Gender. Men are at higher risk than women before age 12. After age 60, women are at higher risk than men.  Having obstructive sleep apnea.  Stress. What are the signs or symptoms? High blood pressure may not cause symptoms. Very high blood pressure (hypertensive crisis) may cause:  Headache.  Anxiety.  Shortness of breath.  Nosebleed.  Nausea and vomiting.  Vision changes.  Severe chest pain.  Seizures. How is this diagnosed? This condition is diagnosed by measuring your blood pressure while you are seated, with your arm resting on a flat surface, your legs uncrossed, and your feet flat on the floor. The cuff of the blood pressure monitor will be placed directly against the skin of your upper arm at the level of your heart. It should be measured at least twice using the same arm. Certain conditions can cause a difference in blood pressure between your right and left arms. Certain factors can cause blood  pressure readings to be lower or higher than normal for a short period of time:  When your blood pressure is higher when you are in a health care provider's office than when you are at home, this is called white coat hypertension. Most people with this condition do not need medicines.  When your blood pressure is higher at home than when you are in a health care provider's office, this is called masked hypertension. Most people with this condition may need medicines to control blood pressure. If you have a high blood pressure reading during one visit or you have normal blood pressure with other risk factors, you may be asked to:  Return on a different day to have your blood pressure checked again.  Monitor your blood pressure at home for 1 week or longer. If you are diagnosed with hypertension, you may have other blood or imaging tests to help your health care provider understand your overall risk for other conditions. How is this treated? This condition is treated by making healthy lifestyle changes, such as eating healthy foods, exercising more, and reducing your alcohol intake. Your health care provider may prescribe medicine if lifestyle changes are not enough to get your blood pressure under control, and if:  Your systolic blood pressure is above 130.  Your diastolic blood pressure is above 80.  Your personal target blood pressure may vary depending on your medical conditions, your age, and other factors. Follow these instructions at home: Eating and drinking   Eat a diet that is high in fiber and potassium, and low in sodium, added sugar, and fat. An example eating plan is called the DASH (Dietary Approaches to Stop Hypertension) diet. To eat this way: ? Eat plenty of fresh fruits and vegetables. Try to fill one half of your plate at each meal with fruits and vegetables. ? Eat whole grains, such as whole-wheat pasta, brown rice, or whole-grain bread. Fill about one fourth of your plate  with whole grains. ? Eat or drink low-fat dairy products, such as skim milk or low-fat yogurt. ? Avoid fatty cuts of meat, processed or cured meats, and poultry with skin. Fill about one fourth of your plate with lean proteins, such as fish, chicken without skin, beans, eggs, or tofu. ? Avoid pre-made and processed foods. These tend to be higher in sodium, added sugar, and fat.  Reduce your daily sodium intake. Most people with hypertension should eat less than 1,500 mg of sodium a day.  Do not drink alcohol if: ? Your health care provider tells you not to drink. ? You are pregnant, may be pregnant, or are planning to become pregnant.  If you drink alcohol: ? Limit how much you use to:  0-1 drink a day for women.  0-2 drinks a day for men. ? Be aware of how much alcohol is in your drink. In the [place] one drink equals one 12 oz bottle of beer (355 mL), one 5 oz glass of wine (148 mL), or one 1 oz glass of hard liquor (44 mL). Lifestyle   Work with your health care provider to maintain a healthy body weight or to lose weight. Ask what an ideal weight is for you.  Get at least 30 minutes of exercise most days of the week. Activities may include walking, swimming, or biking.  Include exercise to strengthen your muscles (resistance exercise), such as Pilates or lifting weights, as part of your weekly exercise routine. Try to do these types of exercises for 30 minutes at least 3 days a week.  Do not use any products that contain nicotine or tobacco, such as cigarettes, e-cigarettes, and chewing tobacco. If you need help quitting, ask your health care provider.  Monitor your blood pressure at home as told by your health care provider.  Keep all follow-up visits as told by your health care provider. This is important. Medicines  Take over-the-counter and prescription medicines only as told by your health care provider. Follow directions carefully. Blood pressure medicines must be taken as  prescribed.  Do not skip doses of blood pressure medicine. Doing this puts you at risk for problems and can make the medicine less effective.  Ask your health care provider about side effects or reactions to medicines that you should watch for. Contact a health care provider if you:  Think you are having a reaction to a medicine you are taking.  Have headaches that keep coming back (recurring).  Feel dizzy.  Have swelling in your ankles.  Have trouble with your vision. Get help right away if you:  Develop a severe headache or confusion.  Have unusual weakness or numbness.  Feel faint.  Have severe pain in your chest or abdomen.  Vomit repeatedly.  Have trouble breathing. Summary  Hypertension is when the force of blood pumping through your arteries is too  strong. If this condition is not controlled, it may put you at risk for serious complications.  Your personal target blood pressure may vary depending on your medical conditions, your age, and other factors. For most people, a normal blood pressure is less than 120/80.  Hypertension is treated with lifestyle changes, medicines, or a combination of both. Lifestyle changes include losing weight, eating a healthy, low-sodium diet, exercising more, and limiting alcohol. This information is not intended to replace advice given to you by your health care provider. Make sure you discuss any questions you have with your health care provider. Document Revised: 09/10/2017 Document Reviewed: 09/10/2017 Elsevier Patient Education  2020 [employer].

## 2019-10-01 NOTE — Progress Notes (Signed)
Chronic Care Management Pharmacy  Name: [name]  MRN: [id number] DOB: February 18, 1940  Chief Complaint/ HPI  [name],  79 y.o. , male presents for their Follow-Up CCM visit with the clinical pharmacist via telephone due to COVID-19 Pandemic.  PCP : [name], MD.   Chronic conditions include:  Encounter Diagnoses  Name Primary?  . Atrial fibrillation, unspecified type ([place]) Yes  . Essential hypertension     Office Visits 08/12/2019 (PCP): Fall/wounds- pt was trying to replace rotten floor boards [place] his hunting place yesterday and fell throughthe floor.  UTD on Td.  R elbow wound and abrasion of R knee.  Pt is on xarelto.  Did not hit head. Bacitracin once daily.  06/16/2019 (Dr. Stanford Breed, cardiology): Coreg dose halved to 6.25 mg twice daily, rosuvastatin increased to 40 mg daily   Patient Active Problem List   Diagnosis Date Noted  . Encounter for follow-up surveillance of colon cancer   . Benign neoplasm of descending colon   . Pulmonary nodules 05/18/2015  . Rectal bleeding 03/29/2015  . Internal hemorrhoid, bleeding 03/29/2015  . COPD (chronic obstructive pulmonary disease) ([place]) 03/28/2015  . Interstitial lung disease ([place]) 03/28/2015  . Gastroesophageal reflux disease 03/17/2015  . Prostate cancer screening 03/09/2014  . Thoracic aortic aneurysm ([place]) 09/28/2013  . Encounter for therapeutic drug monitoring 03/03/2013  . Constipation, chronic 10/21/2012  . Umbilical hernia with intermittent pain 10/21/2012  . Obesity (BMI 30-39.9) 10/21/2012  . TIA (transient ischemic attack) 09/25/2012  . Other malaise and fatigue 08/25/2012  . Vitamin D deficiency 07/28/2012  . CHF (congestive heart failure) ([place]) 07/28/2012  . Chronotropic incompetence 10/25/2011  . History of non anemic vitamin B12 deficiency 05/29/2011  . Mild cognitive impairment 01/13/2011  . Ringing in ears 01/13/2011  . CAD (coronary artery disease) 09/14/2010  . Elevated transaminase  level--on amiodarone 06/19/2010  . Biventricular implantable cardioverter-defibrillator in situ 05/16/2010  . Long term (current) use of anticoagulants 04/11/2010  . Microscopic hematuria 04/04/2010  . CLAUDICATION 11/28/2009  . Atrial fibrillation ([place]) 06/29/2009  . Ischemic cardiomyopathy 07/07/2008  . VENTRICULAR TACHYCARDIA 06/29/2008  . Hyperlipidemia 01/04/2008  . Essential hypertension 01/04/2008  . Chronic rhinitis 03/04/2007  . Obstructive sleep apnea 03/02/2007   Past Surgical History:  Procedure Laterality Date  . ATRIAL ABLATION SURGERY     flutter ablation 2002  . BREAST SURGERY Left 1990   "Tissue removed from breast"  . CARDIAC CATHETERIZATION    . carotid doppler  09/25/12   No signif stenosis  . COLONOSCOPY N/A 07/13/2012   Diverticula ascending colon.  Polyp x 1-recall 5 yrs (Dr. Watt Climes) Procedure: COLONOSCOPY;  Surgeon: Jeryl Columbia, MD;  Location: [facility];  Service: Endoscopy;  Laterality: N/A; polypectomy x 1   . COLONOSCOPY WITH PROPOFOL N/A 08/18/2015   Procedure: COLONOSCOPY WITH PROPOFOL;  Surgeon: Mauri Pole, MD;  Location: [facility];  Service: Endoscopy;  Laterality: N/A;  . ESOPHAGOGASTRODUODENOSCOPY N/A 07/13/2012   Procedure: ESOPHAGOGASTRODUODENOSCOPY (EGD);  Surgeon: Jeryl Columbia, MD;  Location: [facility];  Service: Endoscopy;  Laterality: N/A;  NORMAL  . EXTRACORPOREAL SHOCK WAVE LITHOTRIPSY    . HEMORRHOID BANDING    . ICD     a) Guidant Contak H170- Virl Axe, MD 07/25/06 (second device) b) Medtronic CRT-D  . INGUINAL HERNIA REPAIR Bilateral 1975  . LEAD REVISION Bilateral 12/26/2011   Procedure: LEAD REVISION;  Surgeon: Evans Lance, MD;  Location: [facility] CATH LAB;  Service: Cardiovascular;  Laterality: Bilateral;  .  PFTs  02/2015   Mod restriction (interstitial), mod diffusion defect (Dr. Annamaria Boots)  . precancer area keratsis removed  05/2015    leftf orehead and left arm and right arm and back   Social History   Socioeconomic  History  . Marital status: Married    Spouse name: Not on file  . Number of children: 3  . Years of education: Not on file  . Highest education level: Not on file  Occupational History  . Occupation: Retired    Fish farm manager: RETIRED  Tobacco Use  . Smoking status: Former Smoker    Packs/day: 2.00    Years: 15.00    Pack years: 30.00    Types: Cigarettes    Quit date: 01/15/1980    Years since quitting: 39.7  . Smokeless tobacco: Never Used  Vaping Use  . Vaping Use: Never used  Substance and Sexual Activity  . Alcohol use: No    Alcohol/week: 0.0 standard drinks  . Drug use: No  . Sexual activity: Not on file  Other Topics Concern  . Not on file  Social History Narrative   Married.   Hx of cigarettes: quit in the 1980s.  No alc/drugs.   Social Determinants of Health   Financial Resource Strain:   . Difficulty of Paying Living Expenses: Not on file  Food Insecurity: No Food Insecurity  . Worried About Charity fundraiser in the Last Year: Never true  . Ran Out of Food in the Last Year: Never true  Transportation Needs: No Transportation Needs  . Lack of Transportation (Medical): No  . Lack of Transportation (Non-Medical): No  Physical Activity:   . Days of Exercise per Week: Not on file  . Minutes of Exercise per Session: Not on file  Stress:   . Feeling of Stress : Not on file  Social Connections:   . Frequency of Communication with Friends and Family: Not on file  . Frequency of Social Gatherings with Friends and Family: Not on file  . Attends Religious Services: Not on file  . Active Member of Clubs or Organizations: Not on file  . Attends Archivist Meetings: Not on file  . Marital Status: Not on file   Family History  Problem Relation Age of Onset  . Heart disease Mother   . Other Mother        colon surgery for fistula  . Gallstones Mother   . Breast cancer Maternal Grandmother   . Coronary artery disease Father        family hx of  . Heart  attack Father        4 stents/ pacemaker  . Nephrolithiasis Father   . Hypertension Sister   . Goiter Paternal Grandmother   . Nephrolithiasis Son   . Colon cancer Neg Hx    Allergies  Allergen Reactions  . Sulfa Antibiotics Rash  . Crestor [Rosuvastatin] Rash   Outpatient Encounter Medications as of 10/01/2019  Medication Sig  . antiseptic oral rinse (BIOTENE) LIQD 15 mLs by Mouth Rinse route as needed for dry mouth (TID).  [name] budesonide-formoterol (SYMBICORT) 160-4.5 MCG/ACT inhaler Inhale 2 puffs into the lungs 2 (two) times daily.  . carvedilol (COREG) 12.5 MG tablet TAKE 1 TABLET(12.5 MG) BY MOUTH TWICE DAILY WITH A MEAL  . carvedilol (COREG) 6.25 MG tablet Take 1 tablet (6.25 mg total) by mouth 2 (two) times daily with a meal.  . cholecalciferol (VITAMIN D) 1000 units tablet Take 1,000 Units by mouth daily.  [name]  eplerenone (INSPRA) 25 MG tablet Take 1 tablet (25 mg total) by mouth daily.  . fluticasone (FLONASE) 50 MCG/ACT nasal spray SHAKE LIQUID AND USE 2 SPRAYS IN EACH NOSTRIL TWICE DAILY  . furosemide (LASIX) 40 MG tablet TAKE 2 TABLETS BY MOUTH EVERY DAY  . ipratropium (ATROVENT) 0.06 % nasal spray USE 2 SPRAYS IN EACH NOSTRIL THREE TIMES DAILY  . loratadine (CLARITIN) 10 MG tablet Take 10 mg by mouth daily.  . Magnesium 250 MG TABS Take 1 tablet by mouth daily.  [name] mexiletine (MEXITIL) 150 MG capsule TAKE 2 CAPSULES(300 MG) BY MOUTH TWICE DAILY  . Multiple Vitamin (MULTIVITAMIN WITH MINERALS) TABS Take 1 tablet by mouth daily.  . nitroGLYCERIN (NITROSTAT) 0.4 MG SL tablet DISSOLVE 1 TABLET UNDER THE TONGUE EVERY 5 MINUTES AS NEEDED FOR CHEST PAIN  . polyvinyl alcohol-povidone (REFRESH) 1.4-0.6 % ophthalmic solution Place 1-2 drops into both eyes as needed (dry eyes).   . potassium chloride SA (KLOR-CON) 20 MEQ tablet TAKE 1 TABLET(20 MEQ) BY MOUTH DAILY  . Probiotic Product (PROBIOTIC DAILY PO) Take 1 tablet by mouth daily.  . rivaroxaban (XARELTO) 20 MG TABS tablet Take 1  tablet (20 mg total) by mouth daily with supper.  . rosuvastatin (CRESTOR) 40 MG tablet Take 1 tablet (40 mg total) by mouth daily.  . sacubitril-valsartan (ENTRESTO) 24-26 MG Take 1 tablet by mouth 2 (two) times daily.  . VENTOLIN HFA 108 (90 Base) MCG/ACT inhaler INHALE 1 TO 2 PUFFS INTO THE LUNGS EVERY 4 HOURS AS NEEDED FOR WHEEZING OR SHORTNESS OF BREATH  . Wheat Dextrin (BENEFIBER PO) Take 2 Scoops by mouth 2 (two) times daily.   No facility-administered encounter medications on file as of 10/01/2019.   Patient Care Team    Relationship Specialty Notifications Start End  [name], MD PCP - General Family Medicine  07/10/15   Festus Aloe, MD Consulting Physician Urology  01/25/12   Michael Boston, MD Consulting Physician General Surgery  10/21/12   [name], MD Consulting Physician Cardiology  0/0/86   Jodi Marble, MD Consulting Physician Otolaryngology  03/25/15   Deboraha Sprang, MD Consulting Physician Cardiology  06/22/15   Luberta Mutter, MD Consulting Physician Ophthalmology  07/10/15   Collene Gobble, MD Consulting Physician Pulmonary Disease  07/10/15   Mauri Pole, MD Consulting Physician Gastroenterology  12/27/15   [name], [facility] Pharmacist Pharmacist  05/06/19    Comment: PHONE NUMBER [phone]   Current Diagnosis/Assessment: Goals Addressed            This Visit's Progress   . PharmD Care Plan   On track    CARE PLAN ENTRY  Current Barriers:  . Chronic Disease Management support, education, and care coordination needs related to Hypertension, Hyperlipidemia, Atrial Fibrillation, Heart Failure, and COPD   Hypertension / Heart Failure . Pharmacist Clinical Goal(s): o Over the next 180 days, patient will work with PharmD and providers to maintain BP goal <130/80 . Current regimen:  . Entresto 24-26 twice daily  . Coreg 6.25 mg twice daily  . Eplerenone 25 mg daily . Furosemide 80 mg daily . Klor-con 20 meq daily   . Interventions: o Continue current management . Patient self care activities - Over the next 180 days, patient will: o Check BP at least once every 1-2 days, document, and provide at future appointments o Ensure daily salt intake < 2300 mg/day  Hyperlipidemia . Pharmacist Clinical Goal(s): o Over the next 180 days, patient will work  with PharmD and providers to maintain LDL goal < 70 . Current regimen:  o Rosuvastatin 40 mg once daily . Interventions: o Continue current management . Patient self care activities - Over the next 180 days, patient will: o Continue current management  Afib . Pharmacist Clinical Goal(s) o Over the next 180 days, patient will work with PharmD and providers to minimize symptoms of afib and maintain rate/rhythm control. . Current regimen:  o Xarelto 20 mg daily with supper o Mexitil 150 mg caps - two caps twice daily  . Interventions: o Continue current management . Patient self care activities - Over the next 180 days, patient will: o Continue current management  Medication management . Pharmacist Clinical Goal(s): o Over the next 180 days, patient will work with PharmD and providers to maintain optimal medication adherence . Current pharmacy: [facility] . Interventions o Comprehensive medication review performed. o Continue current medication management strategy . Patient self care activities - Over the next 180 days, patient will: o Focus on medication adherence by continuing current medication management o Take medications as prescribed o Report any questions or concerns to PharmD and/or provider(s) COPD . Pharmacist Clinical Goal(s) o Over the next 180 days, patient will work with PharmD and providers to minimize COPD symptoms . Current regimen:  . Symbicort 160-4.5 two puff twice daily . Ventolin 1-2 puffs every 4 hours as needed . Interventions: o Continue current management . Patient self care activities - Over the next 180 days,  patient will: o Continue current management Initial goal documentation.      Heart failure    BP Readings from Last 3 Encounters:  08/12/19 119/76  06/16/19 94/60  05/31/19 100/60   CMP Latest Ref Rng & Units 08/09/2019 05/17/2019 10/29/2018  Glucose 70 - 99 mg/dL - 110(H) 118(H)  BUN 6 - 23 mg/dL - 13 14  Creatinine 0.40 - 1.50 mg/dL - 0.86 0.87  Sodium 135 - 145 mEq/L - 135 135  Potassium 3.5 - 5.1 mEq/L - 4.2 4.1  Chloride 96 - 112 mEq/L - 103 103  CO2 19 - 32 mEq/L - 26 24  Calcium 8.4 - 10.5 mg/dL - 8.8 8.8  Total Protein 6.0 - 8.3 g/dL 6.2 6.2 6.4  Total Bilirubin 0.2 - 1.2 mg/dL 0.5 0.7 0.7  Alkaline Phos 39 - 117 U/L 55 59 48  AST 0 - 37 U/L _0 ALT 0 - 53 U/L _1 Recent home BPs - 107/71, 104/70. No current dizziness. Checking once weekly. EF is 20%. Electrolytes stable. Patient is currently controlled on the following medications:  . Entresto 24-26 twice daily  . Coreg 6.25 mg twice daily  . Eplerenone 25 mg daily . Furosemide 80 mg daily . Klor-con 20 meq daily   Plan  Continue current medications and control with diet and exercise    AFIB   Previous pulmonary toxicity.   Pulse Readings from Last 3 Encounters:  08/12/19 74  06/16/19 60  05/31/19 60   Patient is currently rate controlled at consistently <70 BPM. Also taking coreg. Denies dizziness, SOB, chest pain. Patient is currently controlled on the following medications:  [name] Mexitil 150 mg capsule - two caps twice daily   Stroke prevention in afib. This patients CHA2DS2-VASc Score = 5. On AC with Xarelto. No current issues with accessibility.  Denies any abnormal bruising, bleeding from nose or gums or blood in urine or stool. Patient is currently controlled on the following medications:  .  Xarelto 20 mg every day with supper   Plan  Continue current medications.  Medication Management / Care Coordination    Receives prescription medications from:  [facility] [place] - [address] [place] [address] Phone: [phone] Fax: [phone]  Only issue with current medications is cost, specifically with Entresto and Xarelto. Discussed option of tier exception on these medications. Does not qualify for PAP. Discussed upcoming open enrollment, provided information to contact department of insurance - [place] current medication management strategy.  [place] Follow up: 6 month phone visit. ______________ Visit Information SDOH (Social Determinants of Health) assessments performed: Yes.  Future Appointments  Date Time Provider [place]  11/03/2019  9:20 AM [facility] DEVICE REMOTES [facility]  11/17/2019 10:30 AM [name], MD [facility]  12/04/2019  9:20 AM [facility] DEVICE REMOTES [facility]  12/15/2019 12:00 PM [name], MD [facility] None  01/03/2020 10:05 AM [facility] DEVICE REMOTES [facility]  02/04/2020  9:20 AM [facility] DEVICE REMOTES [facility]  03/06/2020  9:20 AM [facility] DEVICE REMOTES [facility]  03/31/2020  1:30 PM [facility] PHARMACIST [facility]  05/07/2020  9:20 AM [facility] DEVICE REMOTES [facility]   [name], Pharm.D., BCGP Clinical Pharmacist [facility] [phone]

## 2019-10-01 NOTE — Progress Notes (Signed)
Remote ICD transmission.   

## 2019-10-13 DIAGNOSIS — Z23 Encounter for immunization: Secondary | ICD-10-CM | POA: Diagnosis not present

## 2019-10-14 ENCOUNTER — Telehealth: Payer: Self-pay | Admitting: Family Medicine

## 2019-10-14 NOTE — Telephone Encounter (Signed)
Left message for patient to schedule Annual Wellness Visit.  Please schedule with Nurse Health Advisor Caroleen Hamman, RN at [facility]

## 2019-10-26 DIAGNOSIS — Z23 Encounter for immunization: Secondary | ICD-10-CM | POA: Diagnosis not present

## 2019-11-01 ENCOUNTER — Ambulatory Visit (INDEPENDENT_AMBULATORY_CARE_PROVIDER_SITE_OTHER): Payer: Medicare Other

## 2019-11-01 VITALS — Ht 70.0 in | Wt 206.0 lb

## 2019-11-01 DIAGNOSIS — Z0000 Encounter for general adult medical examination without abnormal findings: Secondary | ICD-10-CM

## 2019-11-01 NOTE — Patient Instructions (Signed)
[name] , Thank you for taking time to complete your Medicare Wellness Visit. I appreciate your ongoing commitment to your health goals. Please review the following plan we discussed and let me know if I can assist you in the future.   Screening recommendations/referrals: Colonoscopy: No longer required Recommended yearly ophthalmology/optometry visit for glaucoma screening and checkup Recommended yearly dental visit for hygiene and checkup  Vaccinations: Influenza vaccine: Up to Date Pneumococcal vaccine: Completed vaccines Tdap vaccine: Up to Date-Due-08/28/2027 Shingles vaccine: Completed vaccines  Covid-19: Completed vaccines  Advanced directives: Please bring a copy for your chart  Conditions/risks identified: See problem list  Next appointment: Follow up in one year for your annual wellness visit. 11/06/2020 @ 2:15pm  Preventive Care 65 Years and Older, Male Preventive care refers to lifestyle choices and visits with your health care provider that can promote health and wellness. What does preventive care include?  A yearly physical exam. This is also called an annual well check.  Dental exams once or twice a year.  Routine eye exams. Ask your health care provider how often you should have your eyes checked.  Personal lifestyle choices, including:  Daily care of your teeth and gums.  Regular physical activity.  Eating a healthy diet.  Avoiding tobacco and drug use.  Limiting alcohol use.  Practicing safe sex.  Taking low doses of aspirin every day.  Taking vitamin and mineral supplements as recommended by your health care provider. What happens during an annual well check? The services and screenings done by your health care provider during your annual well check will depend on your age, overall health, lifestyle risk factors, and family history of disease. Counseling  Your health care provider may ask you questions about your:  Alcohol use.  Tobacco  use.  Drug use.  Emotional well-being.  Home and relationship well-being.  Sexual activity.  Eating habits.  History of falls.  Memory and ability to understand (cognition).  Work and work Statistician. Screening  You may have the following tests or measurements:  Height, weight, and BMI.  Blood pressure.  Lipid and cholesterol levels. These may be checked every 5 years, or more frequently if you are over 21 years old.  Skin check.  Lung cancer screening. You may have this screening every year starting at age 83 if you have a 30-pack-year history of smoking and currently smoke or have quit within the past 15 years.  Fecal occult blood test (FOBT) of the stool. You may have this test every year starting at age 70.  Flexible sigmoidoscopy or colonoscopy. You may have a sigmoidoscopy every 5 years or a colonoscopy every 10 years starting at age 19.  Prostate cancer screening. Recommendations will vary depending on your family history and other risks.  Hepatitis C blood test.  Hepatitis B blood test.  Sexually transmitted disease (STD) testing.  Diabetes screening. This is done by checking your blood sugar (glucose) after you have not eaten for a while (fasting). You may have this done every 1-3 years.  Abdominal aortic aneurysm (AAA) screening. You may need this if you are a current or former smoker.  Osteoporosis. You may be screened starting at age 75 if you are at high risk. Talk with your health care provider about your test results, treatment options, and if necessary, the need for more tests. Vaccines  Your health care provider may recommend certain vaccines, such as:  Influenza vaccine. This is recommended every year.  Tetanus, diphtheria, and acellular pertussis (Tdap,  Td) vaccine. You may need a Td booster every 10 years.  Zoster vaccine. You may need this after age 29.  Pneumococcal 13-valent conjugate (PCV13) vaccine. One dose is recommended after age  106.  Pneumococcal polysaccharide (PPSV23) vaccine. One dose is recommended after age 23. Talk to your health care provider about which screenings and vaccines you need and how often you need them. This information is not intended to replace advice given to you by your health care provider. Make sure you discuss any questions you have with your health care provider. Document Released: 01/27/2015 Document Revised: 09/20/2015 Document Reviewed: 11/01/2014 Elsevier Interactive Patient Education  2017 [place] Prevention in the Home Falls can cause injuries. They can happen to people of all ages. There are many things you can do to make your home safe and to help prevent falls. What can I do on the outside of my home?  Regularly fix the edges of walkways and driveways and fix any cracks.  Remove anything that might make you trip as you walk through a door, such as a raised step or threshold.  Trim any bushes or trees on the path to your home.  Use bright outdoor lighting.  Clear any walking paths of anything that might make someone trip, such as rocks or tools.  Regularly check to see if handrails are loose or broken. Make sure that both sides of any steps have handrails.  Any raised decks and porches should have guardrails on the edges.  Have any leaves, snow, or ice cleared regularly.  Use sand or salt on walking paths during winter.  Clean up any spills in your garage right away. This includes oil or grease spills. What can I do in the bathroom?  Use night lights.  Install grab bars by the toilet and in the tub and shower. Do not use towel bars as grab bars.  Use non-skid mats or decals in the tub or shower.  If you need to sit down in the shower, use a plastic, non-slip stool.  Keep the floor dry. Clean up any water that spills on the floor as soon as it happens.  Remove soap buildup in the tub or shower regularly.  Attach bath mats securely with double-sided  non-slip rug tape.  Do not have throw rugs and other things on the floor that can make you trip. What can I do in the bedroom?  Use night lights.  Make sure that you have a light by your bed that is easy to reach.  Do not use any sheets or blankets that are too big for your bed. They should not hang down onto the floor.  Have a firm chair that has side arms. You can use this for support while you get dressed.  Do not have throw rugs and other things on the floor that can make you trip. What can I do in the kitchen?  Clean up any spills right away.  Avoid walking on wet floors.  Keep items that you use a lot in easy-to-reach places.  If you need to reach something above you, use a strong step stool that has a grab bar.  Keep electrical cords out of the way.  Do not use floor polish or wax that makes floors slippery. If you must use wax, use non-skid floor wax.  Do not have throw rugs and other things on the floor that can make you trip. What can I do with my stairs?  Do not  leave any items on the stairs.  Make sure that there are handrails on both sides of the stairs and use them. Fix handrails that are broken or loose. Make sure that handrails are as long as the stairways.  Check any carpeting to make sure that it is firmly attached to the stairs. Fix any carpet that is loose or worn.  Avoid having throw rugs at the top or bottom of the stairs. If you do have throw rugs, attach them to the floor with carpet tape.  Make sure that you have a light switch at the top of the stairs and the bottom of the stairs. If you do not have them, ask someone to add them for you. What else can I do to help prevent falls?  Wear shoes that:  Do not have high heels.  Have rubber bottoms.  Are comfortable and fit you well.  Are closed at the toe. Do not wear sandals.  If you use a stepladder:  Make sure that it is fully opened. Do not climb a closed stepladder.  Make sure that both  sides of the stepladder are locked into place.  Ask someone to hold it for you, if possible.  Clearly mark and make sure that you can see:  Any grab bars or handrails.  First and last steps.  Where the edge of each step is.  Use tools that help you move around (mobility aids) if they are needed. These include:  Canes.  Walkers.  Scooters.  Crutches.  Turn on the lights when you go into a dark area. Replace any light bulbs as soon as they burn out.  Set up your furniture so you have a clear path. Avoid moving your furniture around.  If any of your floors are uneven, fix them.  If there are any pets around you, be aware of where they are.  Review your medicines with your doctor. Some medicines can make you feel dizzy. This can increase your chance of falling. Ask your doctor what other things that you can do to help prevent falls. This information is not intended to replace advice given to you by your health care provider. Make sure you discuss any questions you have with your health care provider. Document Released: 10/27/2008 Document Revised: 06/08/2015 Document Reviewed: 02/04/2014 Elsevier Interactive Patient Education  2017 [employer].

## 2019-11-01 NOTE — Progress Notes (Signed)
Subjective:   [name] is a 79 y.o. male who presents for Medicare Annual/Subsequent preventive examination.  I connected with Joanna today by telephone and verified that I am speaking with the correct person using two identifiers. Location patient: home Location provider: work Persons participating in the virtual visit: patient, Marine scientist.    I discussed the limitations, risks, security and privacy concerns of performing an evaluation and management service by telephone and the availability of in person appointments. I also discussed with the patient that there may be a patient responsible charge related to this service. The patient expressed understanding and verbally consented to this telephonic visit.    Interactive audio and video telecommunications were attempted between this provider and patient, however failed, due to patient having technical difficulties OR patient did not have access to video capability.  We continued and completed visit with audio only.  Some vital signs may be absent or patient reported.   Time Spent with patient on telephone encounter: 20 minutes  Review of Systems     Cardiac Risk Factors include: advanced age (>54mn, >>69women);male gender;hypertension;dyslipidemia     Objective:    Today's Vitals   11/01/19 1331  Weight: 206 lb (93.4 kg)  Height: 5' 10" (1.778 m)   Body mass index is 29.56 kg/m.  Advanced Directives 11/01/2019 06/11/2017 12/27/2015 08/18/2015 08/11/2015 09/25/2012 07/13/2012  Does Patient Have a Medical Advance Directive? _0  Patient has advance directive, copy not in chart Patient has advance directive, copy not in chart  Type of Advance Directive [place] will HCasa [place]Living will Living will;Healthcare Power of [place]ouglasLiving will HSteele CityLiving will [place]Living will HFiddletownLiving will  Does patient want to make changes to medical advance directive? - - No - Patient declined - No - Patient declined - -  Copy of H[place] Chart? No - copy requested No - copy requested No - copy requested No - copy requested - Copy requested from family -  Pre-existing out of facility DNR order (yellow form or pink MOST form) - - - - - No No    Current Medications (verified) Outpatient Encounter Medications as of 11/01/2019  Medication Sig  . antiseptic oral rinse (BIOTENE) LIQD 15 mLs by Mouth Rinse route as needed for dry mouth (TID).  .Marland Kitchenbudesonide-formoterol (SYMBICORT) 160-4.5 MCG/ACT inhaler Inhale 2 puffs into the lungs 2 (two) times daily.  . carvedilol (COREG) 12.5 MG tablet TAKE 1 TABLET(12.5 MG) BY MOUTH TWICE DAILY WITH A MEAL  . carvedilol (COREG) 6.25 MG tablet Take 1 tablet (6.25 mg total) by mouth 2 (two) times daily with a meal.  . cholecalciferol (VITAMIN D) 1000 units tablet Take 1,000 Units by mouth daily.  .Marland Kitcheneplerenone (INSPRA) 25 MG tablet Take 1 tablet (25 mg total) by mouth daily.  . fluticasone (FLONASE) 50 MCG/ACT nasal spray SHAKE LIQUID AND USE 2 SPRAYS IN EACH NOSTRIL TWICE DAILY  . furosemide (LASIX) 40 MG tablet TAKE 2 TABLETS BY MOUTH EVERY DAY  . ipratropium (ATROVENT) 0.06 % nasal spray USE 2 SPRAYS IN EACH NOSTRIL THREE TIMES DAILY  . loratadine (CLARITIN) 10 MG tablet Take 10 mg by mouth daily.  . Magnesium 250 MG TABS Take 1 tablet by mouth daily.  .Marland Kitchenmexiletine (MEXITIL) 150 MG capsule TAKE 2 CAPSULES(300 MG) BY MOUTH TWICE DAILY  . Multiple Vitamin (MULTIVITAMIN WITH MINERALS) TABS Take  1 tablet by mouth daily.  . nitroGLYCERIN (NITROSTAT) 0.4 MG SL tablet DISSOLVE 1 TABLET UNDER THE TONGUE EVERY 5 MINUTES AS NEEDED FOR CHEST PAIN  . polyvinyl alcohol-povidone (REFRESH) 1.4-0.6 % ophthalmic solution Place 1-2 drops into both eyes as needed (dry eyes).   . potassium chloride SA (KLOR-CON) 20 MEQ tablet TAKE 1  TABLET(20 MEQ) BY MOUTH DAILY  . Probiotic Product (PROBIOTIC DAILY PO) Take 1 tablet by mouth daily.  . rivaroxaban (XARELTO) 20 MG TABS tablet Take 1 tablet (20 mg total) by mouth daily with supper.  . rosuvastatin (CRESTOR) 40 MG tablet Take 1 tablet (40 mg total) by mouth daily.  . sacubitril-valsartan (ENTRESTO) 24-26 MG Take 1 tablet by mouth 2 (two) times daily.  . VENTOLIN HFA 108 (90 Base) MCG/ACT inhaler INHALE 1 TO 2 PUFFS INTO THE LUNGS EVERY 4 HOURS AS NEEDED FOR WHEEZING OR SHORTNESS OF BREATH  . Wheat Dextrin (BENEFIBER PO) Take 2 Scoops by mouth 2 (two) times daily.   No facility-administered encounter medications on file as of 11/01/2019.    Allergies (verified) Sulfa antibiotics   History: Past Medical History:  Diagnosis Date  . Adenomatous colon polyp 2009; 06/2012   Colonoscopy 07/2007---Dr. Barron Schmid GI--Repeat 06/2012 showed one tubular adenoma w/out high grade dysplasia.  [name] AICD (automatic cardioverter/defibrillator) present    [employer]  . Atrial fibrillation ([place])   . Bilateral sensorineural hearing loss   . BPH (benign prostatic hypertrophy)    PSAs ok (followed by urologist): no LUTS  . CAD (coronary artery disease)    PTCA RCA 1997 (Inf/post MI);  . CHF (congestive heart failure) ([place])   . Chronic constipation   . Chronic rhinitis    ?vasomotor (Dr. Erik Obey)  . COPD (chronic obstructive pulmonary disease) ([place])   . Diverticulosis of colon 2005   Noted on Colonoscopies in 2005 and 2009 and on CT 2010  . Gallstones 06/2014   No evidence of cholecystitis on abd u/s 06/2014  . GERD (gastroesophageal reflux disease)   . Hepatic steatosis 2010; 2016   Noted on CT abd 2010 and on u/s abd 04/2009 (transaminasemia) and u/s 06/2014.  [name] Hyperlipidemia   . Hypertension   . Ischemic cardiomyopathy   . Microscopic hematuria    Cysto neg 02/2009 except for BPH-likely has microhem from his renal stone dz  . Nephrolithiasis    No distal stones on CT 2010;  stable 8 mm RLP stone, 32m LMP stone 10/2013 plain film.  Abd u/s 06/2014: 9 mm nonobstructing lower pole right renal stone.  03/2015 urol: stable nonobstructing renal calc by CT 12/2014 and KUB 03/2015.  .Marland KitchenObstructive sleep apnea    no cpap uses since weight loss of 50 pounds  . Presence of permanent cardiac pacemaker    [employer] . Squamous cell carcinoma of back   . Ventricular tachycardia ([facility]    Past Surgical History:  Procedure Laterality Date  . ATRIAL ABLATION SURGERY     flutter ablation 2002  . BREAST SURGERY Left 1990   "Tissue removed from breast"  . CARDIAC CATHETERIZATION    . carotid doppler  09/25/12   No signif stenosis  . COLONOSCOPY N/A 07/13/2012   Diverticula ascending colon.  Polyp x 1-recall 5 yrs (Dr. MWatt Climes Procedure: COLONOSCOPY;  Surgeon: MJeryl Columbia MD;  Location: [facility] END[facility]COPY;  Service: Endoscopy;  Laterality: N/A; polypectomy x 1   . COLONOSCOPY WITH PROPOFOL N/A 08/18/2015   Procedure: COLONOSCOPY WITH PROPOFOL;  Surgeon: KVenia Minks  Silverio Decamp, MD;  Location: [facility] ENDOSCOPY;  Service: Endoscopy;  Laterality: N/A;  . ESOPHAGOGASTRODUODENOSCOPY N/A 07/13/2012   Procedure: ESOPHAGOGASTRODUODENOSCOPY (EGD);  Surgeon: Jeryl Columbia, MD;  Location: [facility];  Service: Endoscopy;  Laterality: N/A;  NORMAL  . EXTRACORPOREAL SHOCK WAVE LITHOTRIPSY    . HEMORRHOID BANDING    . ICD     a) Guidant Contak H170- Virl Axe, MD 07/25/06 (second device) b) Medtronic CRT-D  . INGUINAL HERNIA REPAIR Bilateral 1975  . LEAD REVISION Bilateral 12/26/2011   Procedure: LEAD REVISION;  Surgeon: Evans Lance, MD;  Location: [facility] CATH LAB;  Service: Cardiovascular;  Laterality: Bilateral;  . PFTs  02/2015   Mod restriction (interstitial), mod diffusion defect (Dr. Annamaria Boots)  . precancer area keratsis removed  05/2015    leftf [place] and left arm and right arm and back   Family History  Problem Relation Age of Onset  . Heart disease Mother   . Other Mother        colon  surgery for fistula  . Gallstones Mother   . Breast cancer Maternal Grandmother   . Coronary artery disease Father        family hx of  . Heart attack Father        4 stents/ pacemaker  . Nephrolithiasis Father   . Hypertension Sister   . Goiter Paternal Grandmother   . Nephrolithiasis Son   . Colon cancer Neg Hx    Social History   Socioeconomic History  . Marital status: Married    Spouse name: Not on file  . Number of children: 3  . Years of education: Not on file  . Highest education level: Not on file  Occupational History  . Occupation: Retired    Fish farm manager: RETIRED  Tobacco Use  . Smoking status: Former Smoker    Packs/day: 2.00    Years: 15.00    Pack years: 30.00    Types: Cigarettes    Quit date: 01/15/1980    Years since quitting: 39.8  . Smokeless tobacco: Never Used  Vaping Use  . Vaping Use: Never used  Substance and Sexual Activity  . Alcohol use: No    Alcohol/week: 0.0 standard drinks  . Drug use: No  . Sexual activity: Not on file  Other Topics Concern  . Not on file  Social History Narrative   Married.   Hx of cigarettes: quit in the 1980s.  No alc/drugs.   Social Determinants of Health   Financial Resource Strain: Low Risk   . Difficulty of Paying Living Expenses: Not hard at all  Food Insecurity: No Food Insecurity  . Worried About Charity fundraiser in the Last Year: Never true  . Ran Out of Food in the Last Year: Never true  Transportation Needs: No Transportation Needs  . Lack of Transportation (Medical): No  . Lack of Transportation (Non-Medical): No  Physical Activity: Sufficiently Active  . Days of Exercise per Week: 7 days  . Minutes of Exercise per Session: 40 min  Stress: No Stress Concern Present  . Feeling of Stress : Not at all  Social Connections: Moderately Isolated  . Frequency of Communication with Friends and Family: More than three times a week  . Frequency of Social Gatherings with Friends and Family: Once a week  .  Attends Religious Services: Never  . Active Member of Clubs or Organizations: No  . Attends Archivist Meetings: Never  . Marital Status: Married    Tobacco Counseling  Counseling given: Not Answered   Clinical Intake:  Pre-visit preparation completed: Yes  Pain : No/denies pain     Nutritional Status: BMI 25 -29 Overweight Nutritional Risks: None Diabetes: No  How often do you need to have someone help you when you read instructions, pamphlets, or other written materials from your doctor or pharmacy?: 1 - Never What is the last grade level you completed in school?: College  Diabetic?No  Interpreter Needed?: No  Information entered by :: Caroleen Hamman LPN   Activities of Daily Living In your present state of health, do you have any difficulty performing the following activities: 11/01/2019 08/12/2019  Hearing? Y N  Comment mild hearing loss -  Vision? N N  Difficulty concentrating or making decisions? N N  Walking or climbing stairs? N N  Dressing or bathing? N N  Doing errands, shopping? N N  Preparing Food and eating ? N -  Using the Toilet? N -  In the past six months, have you accidently leaked urine? N -  Do you have problems with loss of bowel control? N -  Managing your Medications? N -  Managing your Finances? N -  Housekeeping or managing your Housekeeping? N -  Some recent data might be hidden    Patient Care Team: [name], MD as PCP - General (Family Medicine) Festus Aloe, MD as Consulting Physician (Urology) Michael Boston, MD as Consulting Physician (General Surgery) [name], MD as Consulting Physician (Cardiology) Jodi Marble, MD as Consulting Physician (Otolaryngology) Deboraha Sprang, MD as Consulting Physician (Cardiology) Luberta Mutter, MD as Consulting Physician (Ophthalmology) Lamonte Sakai Rose Fillers, MD as Consulting Physician (Pulmonary Disease) Mauri Pole, MD as Consulting Physician  (Gastroenterology) [name], [facility] as Pharmacist (Pharmacist)  Indicate any recent Medical Services you may have received from other than [facility] providers in the past year (date may be approximate).     Assessment:   This is a routine wellness examination for Mert.  Hearing/Vision screen  Hearing Screening   125Hz 250Hz 500Hz 1000Hz 2000Hz 3000Hz 4000Hz 6000Hz 8000Hz  Right ear:           Left ear:           Vision Screening Comments: Last eye exam-08/2019- DR. McCuwan  Dietary issues and exercise activities discussed: Current Exercise Habits: Home exercise routine, Type of exercise: walking, Time (Minutes): 40, Frequency (Times/Week): 7, Weekly Exercise (Minutes/Week): 280, Intensity: Mild  Goals    . PharmD Care Plan     CARE PLAN ENTRY  Current Barriers:  . Chronic Disease Management support, education, and care coordination needs related to Hypertension, Hyperlipidemia, Atrial Fibrillation, Heart Failure, and COPD   Hypertension / Heart Failure . Pharmacist Clinical Goal(s): o Over the next 180 days, patient will work with PharmD and providers to maintain BP goal <130/80 . Current regimen:  . Entresto 24-26 twice daily  . Coreg 6.25 mg twice daily  . Eplerenone 25 mg daily . Furosemide 80 mg daily . Klor-con 20 meq daily  . Interventions: o Continue current management . Patient self care activities - Over the next 180 days, patient will: o Check BP at least once every 1-2 days, document, and provide at future appointments o Ensure daily salt intake < 2300 mg/day  Hyperlipidemia . Pharmacist Clinical Goal(s): o Over the next 180 days, patient will work with PharmD and providers to maintain LDL goal < 70 . Current regimen:  o Rosuvastatin 40 mg once daily . Interventions: o Continue  current management . Patient self care activities - Over the next 180 days, patient will: o Continue current management  Afib . Pharmacist Clinical Goal(s) o Over the next 180 days,  patient will work with PharmD and providers to minimize symptoms of afib and maintain rate/rhythm control. . Current regimen:  o Xarelto 20 mg daily with supper o Mexitil 150 mg caps - two caps twice daily  . Interventions: o Continue current management . Patient self care activities - Over the next 180 days, patient will: o Continue current management  Medication management . Pharmacist Clinical Goal(s): o Over the next 180 days, patient will work with PharmD and providers to maintain optimal medication adherence . Current pharmacy: [facility] . Interventions o Comprehensive medication review performed. o Continue current medication management strategy . Patient self care activities - Over the next 180 days, patient will: o Focus on medication adherence by continuing current medication management o Take medications as prescribed o Report any questions or concerns to PharmD and/or provider(s) COPD . Pharmacist Clinical Goal(s) o Over the next 180 days, patient will work with PharmD and providers to minimize COPD symptoms . Current regimen:  . Symbicort 160-4.5 two puff twice daily . Ventolin 1-2 puffs every 4 hours as needed . Interventions: o Continue current management . Patient self care activities - Over the next 180 days, patient will: o Continue current management Initial goal documentation.    . Weight (lb) < 200 lb (90.7 kg)     Lose weight by decreasing caloric intake and maintain activity.       Depression Screen PHQ 2/9 Scores 11/01/2019 08/12/2019 05/17/2019 10/29/2018 10/29/2018 07/23/2018 06/11/2017  PHQ - 2 Score 0 0 0 0 0 0 1  PHQ- 9 Score - - 0 0 0 0 -    Fall Risk Fall Risk  11/01/2019 08/12/2019 08/12/2019 05/17/2019 10/29/2018  Falls in the past year? 0 0 0 1 0  Number falls in past yr: 0 0 0 1 0  Injury with Fall? 0 1 0 0 0  Risk Factor Category  - - - - -  Risk for fall due to : - - - - -  Risk for fall due to: Comment - - - - -  Follow up Falls prevention  discussed Falls evaluation completed - Falls evaluation completed -  Comment - - - - -    Any stairs in or around the home? Yes  If so, are there any without handrails? No  Home free of loose throw rugs in walkways, pet beds, electrical cords, etc? Yes  Adequate lighting in your home to reduce risk of falls? Yes   ASSISTIVE DEVICES UTILIZED TO PREVENT FALLS:  Life alert? No  Use of a cane, walker or w/c? No  Grab bars in the bathroom? Yes  Shower chair or bench in shower? No  Elevated toilet seat or a handicapped toilet? No   TIMED UP AND GO:  Was the test performed? No . Phone visit   Cognitive Function: MMSE - Mini Mental State Exam 06/11/2017  Orientation to time 5  Orientation to Place 5  Registration 3  Attention/ Calculation 5  Recall 2  Language- name 2 objects 2  Language- repeat 1  Language- follow 3 step command 3  Language- read & follow direction 1  Write a sentence 1  Copy design 1  Total score 29     6CIT Screen 11/01/2019  What Year? 0 points  What month? 0 points  What  time? 0 points  Count back from 20 0 points  Months in reverse 0 points  Repeat phrase 0 points  Total Score 0    Immunizations Immunization History  Administered Date(s) Administered  . Fluad Quad(high Dose 65+) 09/23/2018  . Influenza Split 11/11/2010, 11/02/2012, 10/14/2013  . Influenza Whole 09/27/2011, 11/13/2011  . Influenza, High Dose Seasonal PF 09/26/2015, 10/07/2016  . Influenza, Seasonal, Injecte, Preservative Fre 09/23/2018  . Influenza,inj,Quad PF,6+ Mos 11/03/2016, 09/20/2017  . Influenza-Unspecified 09/26/2014  . PFIZER SARS-COV-2 Vaccination 02/02/2019, 02/22/2019, 10/26/2019  . Pneumococcal Conjugate-13 06/15/2014  . Pneumococcal Polysaccharide-23 03/15/2002, 07/10/2015  . Td 08/27/2017  . Tdap 04/04/2010  . Zoster 05/15/2015  . Zoster Recombinat (Shingrix) 03/29/2016    TDAP status: Up to date   Flu Vaccine status: Up to date   Pneumococcal vaccine  status: Up to date   Covid-19 vaccine status: Completed vaccines  Qualifies for Shingles Vaccine? No   Zostavax completed Yes   Shingrix Completed?: Yes  Screening Tests Health Maintenance  Topic Date Due  . INFLUENZA VACCINE  08/15/2019  . COLONOSCOPY  08/17/2020  . TETANUS/TDAP  08/28/2027  . COVID-19 Vaccine  Completed  . Hepatitis C Screening  Completed  . PNA vac Low Risk Adult  Completed    Health Maintenance  Health Maintenance Due  Topic Date Due  . INFLUENZA VACCINE  08/15/2019    Colorectal cancer screening: No longer required.   Lung Cancer Screening: (Low Dose CT Chest recommended if Age 35-80 years, 30 pack-year currently smoking OR have quit w/in 15years.) does not qualify.     Additional Screening:  Hepatitis C Screening: does not qualify  Vision Screening: Recommended annual ophthalmology exams for early detection of glaucoma and other disorders of the eye. Is the patient up to date with their annual eye exam?  Yes  Who is the provider or what is the name of the office in which the patient attends annual eye exams? Dr. Kara Dies   Dental Screening: Recommended annual dental exams for proper oral hygiene  Community Resource Referral / Chronic Care Management: CRR required this visit?  No   CCM required this visit?  No      Plan:     I have personally reviewed and noted the following in the patient's chart:   . Medical and social history . Use of alcohol, tobacco or illicit drugs  . Current medications and supplements . Functional ability and status . Nutritional status . Physical activity . Advanced directives . List of other physicians . Hospitalizations, surgeries, and ER visits in previous 12 months . Vitals . Screenings to include cognitive, depression, and falls . Referrals and appointments  In addition, I have reviewed and discussed with patient certain preventive protocols, quality metrics, and best practice recommendations. A  written personalized care plan for preventive services as well as general preventive health recommendations were provided to patient.   Due to this being a telephonic visit, the after visit summary with patients personalized plan was offered to patient via mail or my-chart. Per request, patient was mailed a copy of [place], LPN   [id number]  Nurse Health Advisor  Nurse Notes: None

## 2019-11-03 ENCOUNTER — Ambulatory Visit (INDEPENDENT_AMBULATORY_CARE_PROVIDER_SITE_OTHER): Payer: Medicare Other

## 2019-11-03 DIAGNOSIS — I472 Ventricular tachycardia: Secondary | ICD-10-CM

## 2019-11-03 DIAGNOSIS — I4729 Other ventricular tachycardia: Secondary | ICD-10-CM

## 2019-11-05 LAB — CUP PACEART REMOTE DEVICE CHECK
Battery Remaining Longevity: 4 mo
Battery Remaining Percentage: 5 %
Brady Statistic RA Percent Paced: 87 %
Brady Statistic RV Percent Paced: 98 %
Date Time Interrogation Session: 20211020030100
HighPow Impedance: 51 Ohm
Implantable Lead Implant Date: 20020527
Implantable Lead Implant Date: 20020527
Implantable Lead Implant Date: 20131212
Implantable Lead Location: 753858
Implantable Lead Location: 753859
Implantable Lead Location: 753860
Implantable Lead Model: 148
Implantable Lead Model: 4194
Implantable Lead Model: 6940
Implantable Lead Serial Number: 120244
Implantable Pulse Generator Implant Date: 20111207
Lead Channel Impedance Value: 529 Ohm
Lead Channel Impedance Value: 606 Ohm
Lead Channel Impedance Value: 912 Ohm
Lead Channel Pacing Threshold Amplitude: 0.6 V
Lead Channel Pacing Threshold Amplitude: 1 V
Lead Channel Pacing Threshold Amplitude: 1.7 V
Lead Channel Pacing Threshold Pulse Width: 0.3 ms
Lead Channel Pacing Threshold Pulse Width: 0.4 ms
Lead Channel Pacing Threshold Pulse Width: 1 ms
Lead Channel Setting Pacing Amplitude: 2 V
Lead Channel Setting Pacing Amplitude: 2.5 V
Lead Channel Setting Pacing Amplitude: 2.5 V
Lead Channel Setting Pacing Pulse Width: 0.4 ms
Lead Channel Setting Pacing Pulse Width: 1 ms
Lead Channel Setting Sensing Sensitivity: 0.5 mV
Lead Channel Setting Sensing Sensitivity: 1 mV
Pulse Gen Serial Number: 492371

## 2019-11-09 NOTE — Addendum Note (Signed)
Addended by: Cheri Kearns A on: 11/09/2019 09:50 AM   Modules accepted: Level of Service

## 2019-11-09 NOTE — Progress Notes (Signed)
Remote ICD transmission.   

## 2019-11-14 ENCOUNTER — Other Ambulatory Visit: Payer: Self-pay | Admitting: Cardiology

## 2019-11-14 DIAGNOSIS — I5022 Chronic systolic (congestive) heart failure: Secondary | ICD-10-CM

## 2019-11-15 ENCOUNTER — Telehealth: Payer: Self-pay

## 2019-11-15 NOTE — Telephone Encounter (Signed)
Spoke with pt, Aware of dr Jacalyn Lefevre recommendations.

## 2019-11-15 NOTE — Telephone Encounter (Signed)
Latitude alert received for Long AF episode, ongoing at time of transmission.  VR controlled, pt BiV paced in 70s.    Pt has history of PAF, meds include Carvedilol 6.34m BID, Mexiliteine 3095mBID, Xarelto 2022mor OACJerome  Spoke with pt, he was not aware of being in AF, he denies any symptoms.  Pt sent manual transmission to confirm back in SR today.  Episode in question was 44hours long which is longer than usual for patient.    Pt requested that info be shared with Dr. CreStanford Breed he decreased Carvedilol at last visit, explained to pt that Carvedilol would not control heart rhythm but I would include him in communication.    Advised pt anticipate continued monitoring.        Presenting Rhythm 11/15/19

## 2019-11-15 NOTE — Telephone Encounter (Signed)
We will continue present medications and follow. [name]

## 2019-11-17 ENCOUNTER — Other Ambulatory Visit: Payer: Self-pay

## 2019-11-17 ENCOUNTER — Ambulatory Visit (INDEPENDENT_AMBULATORY_CARE_PROVIDER_SITE_OTHER): Payer: Medicare Other | Admitting: Family Medicine

## 2019-11-17 ENCOUNTER — Encounter: Payer: Self-pay | Admitting: Family Medicine

## 2019-11-17 VITALS — BP 122/70 | HR 61 | Temp 97.1°F | Resp 20 | Ht 70.0 in | Wt 213.4 lb

## 2019-11-17 DIAGNOSIS — E669 Obesity, unspecified: Secondary | ICD-10-CM | POA: Diagnosis not present

## 2019-11-17 DIAGNOSIS — I10 Essential (primary) hypertension: Secondary | ICD-10-CM

## 2019-11-17 DIAGNOSIS — Z125 Encounter for screening for malignant neoplasm of prostate: Secondary | ICD-10-CM | POA: Diagnosis not present

## 2019-11-17 DIAGNOSIS — E7800 Pure hypercholesterolemia, unspecified: Secondary | ICD-10-CM

## 2019-11-17 DIAGNOSIS — E559 Vitamin D deficiency, unspecified: Secondary | ICD-10-CM

## 2019-11-17 DIAGNOSIS — Z8639 Personal history of other endocrine, nutritional and metabolic disease: Secondary | ICD-10-CM | POA: Diagnosis not present

## 2019-11-17 DIAGNOSIS — I255 Ischemic cardiomyopathy: Secondary | ICD-10-CM

## 2019-11-17 LAB — PSA, MEDICARE: PSA: 0.96 ng/ml (ref 0.10–4.00)

## 2019-11-17 LAB — CBC WITH DIFFERENTIAL/PLATELET
Basophils Absolute: 0.1 10*3/uL (ref 0.0–0.1)
Basophils Relative: 1 % (ref 0.0–3.0)
Eosinophils Absolute: 0.6 10*3/uL (ref 0.0–0.7)
Eosinophils Relative: 8.8 % — ABNORMAL HIGH (ref 0.0–5.0)
HCT: 43.3 % (ref 39.0–52.0)
Hemoglobin: 14.7 g/dL (ref 13.0–17.0)
Lymphocytes Relative: 29.1 % (ref 12.0–46.0)
Lymphs Abs: 2 10*3/uL (ref 0.7–4.0)
MCHC: 33.9 g/dL (ref 30.0–36.0)
MCV: 99.5 fl (ref 78.0–100.0)
Monocytes Absolute: 0.6 10*3/uL (ref 0.1–1.0)
Monocytes Relative: 8.2 % (ref 3.0–12.0)
Neutro Abs: 3.7 10*3/uL (ref 1.4–7.7)
Neutrophils Relative %: 52.9 % (ref 43.0–77.0)
Platelets: 288 10*3/uL (ref 150.0–400.0)
RBC: 4.35 Mil/uL (ref 4.22–5.81)
RDW: 14 % (ref 11.5–15.5)
WBC: 7 10*3/uL (ref 4.0–10.5)

## 2019-11-17 LAB — BASIC METABOLIC PANEL
BUN: 12 mg/dL (ref 6–23)
CO2: 27 mEq/L (ref 19–32)
Calcium: 8.7 mg/dL (ref 8.4–10.5)
Chloride: 103 mEq/L (ref 96–112)
Creatinine, Ser: 0.86 mg/dL (ref 0.40–1.50)
GFR: 83.14 mL/min (ref >60.00)
Glucose, Bld: 115 mg/dL — ABNORMAL HIGH (ref 70–99)
Potassium: 3.8 mEq/L (ref 3.5–5.1)
Sodium: 137 mEq/L (ref 135–145)

## 2019-11-17 LAB — LIPID PANEL
Cholesterol: 127 mg/dL (ref 0–200)
HDL: 36.3 mg/dL — ABNORMAL LOW (ref >39.00)
LDL Cholesterol: 70 mg/dL (ref 0–99)
NonHDL: 90.38
Total CHOL/HDL Ratio: 3
Triglycerides: 102 mg/dL (ref 0.0–149.0)
VLDL: 20.4 mg/dL (ref 0.0–40.0)

## 2019-11-17 LAB — HEPATIC FUNCTION PANEL
ALT: 24 U/L (ref 0–53)
AST: 26 U/L (ref 0–37)
Albumin: 4 g/dL (ref 3.5–5.2)
Alkaline Phosphatase: 49 U/L (ref 39–117)
Bilirubin, Direct: 0.1 mg/dL (ref 0.0–0.3)
Total Bilirubin: 0.6 mg/dL (ref 0.2–1.2)
Total Protein: 6.5 g/dL (ref 6.0–8.3)

## 2019-11-17 LAB — TSH: TSH: 1.96 u[IU]/mL (ref 0.35–4.50)

## 2019-11-17 LAB — VITAMIN D 25 HYDROXY (VIT D DEFICIENCY, FRACTURES): VITD: 37.42 ng/mL (ref 30.00–100.00)

## 2019-11-17 LAB — VITAMIN B12: Vitamin B-12: 528 pg/mL (ref 211–911)

## 2019-11-17 NOTE — Assessment & Plan Note (Signed)
Check labs and replete prn.

## 2019-11-17 NOTE — Assessment & Plan Note (Signed)
Chronic problem.  Currently well controlled on Coreg 6.23m BID and Entresto 24/266mdaily.  Asymptomatic at this time.  Will continue to follow.

## 2019-11-17 NOTE — Patient Instructions (Signed)
Follow up in 6 months to recheck BP and cholesterol We'll notify you of your lab results and make any changes if needed Keep up the good work on healthy diet and regular activity- you look great! Call with any questions or concerns Stay Safe!  Stay Healthy! Happy Holidays!!!

## 2019-11-17 NOTE — Assessment & Plan Note (Signed)
Check B12 and replete prn

## 2019-11-17 NOTE — Assessment & Plan Note (Signed)
Chronic problem.  Tolerating Crestor 28m daily w/o difficulty.  Check labs.  Adjust meds prn

## 2019-11-17 NOTE — Assessment & Plan Note (Signed)
Pt's BMI is 30.62.  He has gained 7 lbs.  Discussed healthy diet and regular exercise as he is able.  Will follow.

## 2019-11-17 NOTE — Assessment & Plan Note (Signed)
Pt has upcoming appt w/ [facility].  Will get PSA and fax result

## 2019-11-17 NOTE — Progress Notes (Signed)
Subjective:    Patient ID: [name], male    DOB: 10/02/1940, 79 y.o.   MRN: [id number]  HPI HTN- chronic problem, on Coreg 6.38m BID, Entresto 24-280mdaily w/ good control.  'I feel great'  Denies CP, SOB, HAs, visual changes, edema.  Hyperlipidemia- chronic problem.  On Crestor 4086maily.  Denies abd pain, N/V.  Vit Deficiency- pt has hx of vit D and B12 deficiencies.  Due for repeat labs.  Obesity- pt has gained 7 labs in the last month.  No regular exercise.   Review of Systems For ROS see HPI   This visit occurred during the SARS-CoV-2 public health emergency.  Safety protocols were in place, including screening questions prior to the visit, additional usage of staff PPE, and extensive cleaning of exam room while observing appropriate contact time as indicated for disinfecting solutions.       Objective:   Physical Exam Vitals reviewed.  Constitutional:      General: He is not in acute distress.    Appearance: Normal appearance. He is well-developed.  HENT:     Head: Normocephalic and atraumatic.  Eyes:     Conjunctiva/sclera: Conjunctivae normal.     Pupils: Pupils are equal, round, and reactive to light.  Neck:     Thyroid: No thyromegaly.  Cardiovascular:     Rate and Rhythm: Normal rate and regular rhythm.     Heart sounds: Normal heart sounds. No murmur heard.   Pulmonary:     Effort: Pulmonary effort is normal. No respiratory distress.     Breath sounds: Normal breath sounds.  Abdominal:     General: Bowel sounds are normal. There is no distension.     Palpations: Abdomen is soft.  Musculoskeletal:     Cervical back: Normal range of motion and neck supple.     Right lower leg: No edema.     Left lower leg: No edema.  Lymphadenopathy:     Cervical: No cervical adenopathy.  Skin:    General: Skin is warm and dry.  Neurological:     Mental Status: He is alert and oriented to person, place, and time.     Cranial Nerves: No cranial nerve deficit.   Psychiatric:        Behavior: Behavior normal.           Assessment & Plan:

## 2019-11-19 DIAGNOSIS — N200 Calculus of kidney: Secondary | ICD-10-CM | POA: Diagnosis not present

## 2019-11-19 DIAGNOSIS — N401 Enlarged prostate with lower urinary tract symptoms: Secondary | ICD-10-CM | POA: Diagnosis not present

## 2019-11-19 DIAGNOSIS — N3281 Overactive bladder: Secondary | ICD-10-CM | POA: Diagnosis not present

## 2019-11-19 DIAGNOSIS — R350 Frequency of micturition: Secondary | ICD-10-CM | POA: Diagnosis not present

## 2019-11-22 NOTE — Telephone Encounter (Signed)
I would offer a different perspective   he has largely been in sinus and atrial fib, while unaware now, can only be as good but probably worse than sinus and the "cost" of DCCV is minimal   would not add AAD but if he held sinus for 6-12 months and needed repeat DCCV would suggest that as an alternative ( and preferable to me anyway:)))

## 2019-11-25 ENCOUNTER — Other Ambulatory Visit: Payer: Self-pay | Admitting: Emergency Medicine

## 2019-11-25 DIAGNOSIS — H10413 Chronic giant papillary conjunctivitis, bilateral: Secondary | ICD-10-CM | POA: Diagnosis not present

## 2019-12-04 ENCOUNTER — Ambulatory Visit: Payer: Medicare Other

## 2019-12-07 LAB — CUP PACEART REMOTE DEVICE CHECK
Battery Remaining Longevity: 4 mo
Battery Remaining Percentage: 5 %
Brady Statistic RA Percent Paced: 85 %
Brady Statistic RV Percent Paced: 98 %
Date Time Interrogation Session: 20211121111500
HighPow Impedance: 48 Ohm
Implantable Lead Implant Date: 20020527
Implantable Lead Implant Date: 20020527
Implantable Lead Implant Date: 20131212
Implantable Lead Location: 753858
Implantable Lead Location: 753859
Implantable Lead Location: 753860
Implantable Lead Model: 148
Implantable Lead Model: 4194
Implantable Lead Model: 6940
Implantable Lead Serial Number: 120244
Implantable Pulse Generator Implant Date: 20111207
Lead Channel Impedance Value: 519 Ohm
Lead Channel Impedance Value: 598 Ohm
Lead Channel Impedance Value: 826 Ohm
Lead Channel Pacing Threshold Amplitude: 0.6 V
Lead Channel Pacing Threshold Amplitude: 1 V
Lead Channel Pacing Threshold Amplitude: 1.7 V
Lead Channel Pacing Threshold Pulse Width: 0.3 ms
Lead Channel Pacing Threshold Pulse Width: 0.4 ms
Lead Channel Pacing Threshold Pulse Width: 1 ms
Lead Channel Setting Pacing Amplitude: 2 V
Lead Channel Setting Pacing Amplitude: 2.5 V
Lead Channel Setting Pacing Amplitude: 2.5 V
Lead Channel Setting Pacing Pulse Width: 0.4 ms
Lead Channel Setting Pacing Pulse Width: 1 ms
Lead Channel Setting Sensing Sensitivity: 0.5 mV
Lead Channel Setting Sensing Sensitivity: 1 mV
Pulse Gen Serial Number: 492371

## 2019-12-13 NOTE — Progress Notes (Signed)
HPI: FU history of coronary artery disease, mixed ischemic and nonischemic cardiomyopathy, history of ICD, paroxysmal atrial fibrillation, and ventricular tachycardia. Previous abdominal ultrasound in April 2006 showed no aneurysm. Last cardiac catheterization in October 2008 showed an occluded right coronary artery with left-to-right collateralization. Ejection fraction is 20%. There was no other coronary disease noted. Last Myoview in Dec 2011 showed prior inferior infarct but no ischemia. His ejection fraction was 19%. ABIs in December of 2011 were normal. Previous carotid Dopplers in Sept 2014 showed 0-39% stenosis bilaterally.The patient has had upgrade of his device to CRT-D. Patient had revision of his left ventricular lead in December 2013. Amiodarone discontinued March 2019 because of potential pulmonary toxicity. High resolution chest CT February 2020 showed mild pulmonary fibrosis, mild emphysema. Echocardiogram repeated June 2021 and showed ejection fraction 30 to 35%, moderate left ventricular enlargement, grade 1 diastolic dysfunction, moderate left atrial enlargement, mild mitral regurgitation.  Since last seen,patient denies dyspnea, chest pain, palpitations or syncope.  Current Outpatient Medications  Medication Sig Dispense Refill  . antiseptic oral rinse (BIOTENE) LIQD 15 mLs by Mouth Rinse route as needed for dry mouth (TID).    [name] budesonide-formoterol (SYMBICORT) 160-4.5 MCG/ACT inhaler Inhale 2 puffs into the lungs 2 (two) times daily. 1 Inhaler 12  . carvedilol (COREG) 6.25 MG tablet Take 1 tablet (6.25 mg total) by mouth 2 (two) times daily with a meal. 180 tablet 3  . cholecalciferol (VITAMIN D) 1000 units tablet Take 1,000 Units by mouth daily.    [name] ENTRESTO 24-26 MG TAKE 1 TABLET BY MOUTH TWICE DAILY 180 tablet 3  . eplerenone (INSPRA) 25 MG tablet Take 1 tablet (25 mg total) by mouth daily. 30 tablet 11  . fluticasone (FLONASE) 50 MCG/ACT nasal spray SHAKE LIQUID  AND USE 2 SPRAYS IN EACH NOSTRIL TWICE DAILY 32 g 12  . furosemide (LASIX) 40 MG tablet TAKE 2 TABLETS BY MOUTH EVERY DAY 90 tablet 3  . ipratropium (ATROVENT) 0.06 % nasal spray USE 2 SPRAYS IN EACH NOSTRIL THREE TIMES DAILY 15 mL 12  . loratadine (CLARITIN) 10 MG tablet Take 10 mg by mouth daily.    . Magnesium 250 MG TABS Take 1 tablet by mouth daily.    [name] mexiletine (MEXITIL) 150 MG capsule TAKE 2 CAPSULES(300 MG) BY MOUTH TWICE DAILY 360 capsule 2  . Multiple Vitamin (MULTIVITAMIN WITH MINERALS) TABS Take 1 tablet by mouth daily.    . nitroGLYCERIN (NITROSTAT) 0.4 MG SL tablet DISSOLVE 1 TABLET UNDER THE TONGUE EVERY 5 MINUTES AS NEEDED FOR CHEST PAIN 25 tablet 5  . polyvinyl alcohol-povidone (REFRESH) 1.4-0.6 % ophthalmic solution Place 1-2 drops into both eyes as needed (dry eyes).     . potassium chloride SA (KLOR-CON) 20 MEQ tablet TAKE 1 TABLET(20 MEQ) BY MOUTH DAILY 90 tablet 3  . Probiotic Product (PROBIOTIC DAILY PO) Take 1 tablet by mouth daily.    . rivaroxaban (XARELTO) 20 MG TABS tablet Take 1 tablet (20 mg total) by mouth daily with supper. 90 tablet 3  . rosuvastatin (CRESTOR) 40 MG tablet Take 1 tablet (40 mg total) by mouth daily. 90 tablet 3  . VENTOLIN HFA 108 (90 Base) MCG/ACT inhaler INHALE 1 TO 2 PUFFS INTO THE LUNGS EVERY 4 HOURS AS NEEDED FOR WHEEZING OR SHORTNESS OF BREATH 18 g 5  . Wheat Dextrin (BENEFIBER PO) Take 2 Scoops by mouth 2 (two) times daily.     No current facility-administered medications for this visit.  Past Medical History:  Diagnosis Date  . Adenomatous colon polyp 2009; 06/2012   Colonoscopy 07/2007---Dr. Barron Schmid GI--Repeat 06/2012 showed one tubular adenoma w/out high grade dysplasia.  [name] AICD (automatic cardioverter/defibrillator) present    [employer]  . Atrial fibrillation ([place])   . Bilateral sensorineural hearing loss   . BPH (benign prostatic hypertrophy)    PSAs ok (followed by urologist): no LUTS  . CAD (coronary artery  disease)    PTCA RCA 1997 (Inf/post MI);  . CHF (congestive heart failure) ([place])   . Chronic constipation   . Chronic rhinitis    ?vasomotor (Dr. Erik Obey)  . COPD (chronic obstructive pulmonary disease) ([place])   . Diverticulosis of colon 2005   Noted on Colonoscopies in 2005 and 2009 and on CT 2010  . Gallstones 06/2014   No evidence of cholecystitis on abd u/s 06/2014  . GERD (gastroesophageal reflux disease)   . Hepatic steatosis 2010; 2016   Noted on CT abd 2010 and on u/s abd 04/2009 (transaminasemia) and u/s 06/2014.  [name] Hyperlipidemia   . Hypertension   . Ischemic cardiomyopathy   . Microscopic hematuria    Cysto neg 02/2009 except for BPH-likely has microhem from his renal stone dz  . Nephrolithiasis    No distal stones on CT 2010; stable 8 mm RLP stone, 5m LMP stone 10/2013 plain film.  Abd u/s 06/2014: 9 mm nonobstructing lower pole right renal stone.  03/2015 urol: stable nonobstructing renal calc by CT 12/2014 and KUB 03/2015.  .Marland KitchenObstructive sleep apnea    no cpap uses since weight loss of 50 pounds  . Presence of permanent cardiac pacemaker    [employer] . Squamous cell carcinoma of back   . Ventricular tachycardia ([facility]     Past Surgical History:  Procedure Laterality Date  . ATRIAL ABLATION SURGERY     flutter ablation 2002  . BREAST SURGERY Left 1990   "Tissue removed from breast"  . CARDIAC CATHETERIZATION    . carotid doppler  09/25/12   No signif stenosis  . COLONOSCOPY N/A 07/13/2012   Diverticula ascending colon.  Polyp x 1-recall 5 yrs (Dr. MWatt Climes Procedure: COLONOSCOPY;  Surgeon: MJeryl Columbia MD;  Location: [facility];  Service: Endoscopy;  Laterality: N/A; polypectomy x 1   . COLONOSCOPY WITH PROPOFOL N/A 08/18/2015   Procedure: COLONOSCOPY WITH PROPOFOL;  Surgeon: KMauri Pole MD;  Location: [facility];  Service: Endoscopy;  Laterality: N/A;  . ESOPHAGOGASTRODUODENOSCOPY N/A 07/13/2012   Procedure: ESOPHAGOGASTRODUODENOSCOPY (EGD);  Surgeon:  MJeryl Columbia MD;  [facility];  Service: Endoscopy;  Laterality: N/A;  NORMAL  . EXTRACORPOREAL SHOCK WAVE LITHOTRIPSY    . HEMORRHOID BANDING    . ICD     a) Guidant Contak H170- SVirl Axe MD 07/25/06 (second device) b) Medtronic CRT-D  . INGUINAL HERNIA REPAIR Bilateral 1975  . LEAD REVISION Bilateral 12/26/2011   Procedure: LEAD REVISION;  Surgeon: GEvans Lance MD;  [facility];  Service: Cardiovascular;  Laterality: Bilateral;  . PFTs  02/2015   Mod restriction (interstitial), mod diffusion defect (Dr. YAnnamaria Boots  . precancer area keratsis removed  05/2015    leftf [place] and left arm and right arm and back    Social History   Socioeconomic History  . Marital status: Married    Spouse name: Not on file  . Number of children: 3  . Years of education: Not on file  . Highest education level: Not on file  Occupational History  . Occupation: Retired    Fish farm manager: RETIRED  Tobacco Use  . Smoking status: Former Smoker    Packs/day: 2.00    Years: 15.00    Pack years: 30.00    Types: Cigarettes    Quit date: 01/15/1980    Years since quitting: 39.9  . Smokeless tobacco: Never Used  Vaping Use  . Vaping Use: Never used  Substance and Sexual Activity  . Alcohol use: No    Alcohol/week: 0.0 standard drinks  . Drug use: No  . Sexual activity: Not on file  Other Topics Concern  . Not on file  Social History Narrative   Married.   Hx of cigarettes: quit in the 1980s.  No alc/drugs.   Social Determinants of Health   Financial Resource Strain: Low Risk   . Difficulty of Paying Living Expenses: Not hard at all  Food Insecurity: No Food Insecurity  . Worried About Charity fundraiser in the Last Year: Never true  . Ran Out of Food in the Last Year: Never true  Transportation Needs: No Transportation Needs  . Lack of Transportation (Medical): No  . Lack of Transportation (Non-Medical): No  Physical Activity: Sufficiently Active  . Days of Exercise per  Week: 7 days  . Minutes of Exercise per Session: 40 min  Stress: No Stress Concern Present  . Feeling of Stress : Not at all  Social Connections: Moderately Isolated  . Frequency of Communication with Friends and Family: More than three times a week  . Frequency of Social Gatherings with Friends and Family: Once a week  . Attends Religious Services: Never  . Active Member of Clubs or Organizations: No  . Attends Archivist Meetings: Never  . Marital Status: Married  Human resources officer Violence: Not At Risk  . Fear of Current or Ex-Partner: No  . Emotionally Abused: No  . Physically Abused: No  . Sexually Abused: No    Family History  Problem Relation Age of Onset  . Heart disease Mother   . Other Mother        colon surgery for fistula  . Gallstones Mother   . Breast cancer Maternal Grandmother   . Coronary artery disease Father        family hx of  . Heart attack Father        4 stents/ pacemaker  . Nephrolithiasis Father   . Hypertension Sister   . Goiter Paternal Grandmother   . Nephrolithiasis Son   . Colon cancer Neg Hx     ROS: no fevers or chills, productive cough, hemoptysis, dysphasia, odynophagia, melena, hematochezia, dysuria, hematuria, rash, seizure activity, orthopnea, PND, pedal edema, claudication. Remaining systems are negative.  Physical Exam: Well-developed well-nourished in no acute distress.  Skin is warm and dry.  HEENT is normal.  Neck is supple.  Chest is clear to auscultation with normal expansion.  Cardiovascular exam is regular rate and rhythm.  Abdominal exam nontender or distended. No masses palpated. Extremities show no edema. neuro grossly intact  ECG-ventricular pacing with probable underlying atrial flutter personally reviewed  A/P  1 coronary artery disease-patient denies chest pain.  Continue Crestor.  He is not on aspirin given need for Xarelto.  2 paroxysmal atrial fibrillation-patient appears to be in atrial flutter  today.  However his heart rate is controlled and we will continue carvedilol at present dose.  Continue Xarelto.  He is asymptomatic with no dyspnea or fatigue.  He may be having paroxysmal  atrial arrhythmias.  Regardless I think rate control and anticoagulation is appropriate given that he is asymptomatic.  Note we discontinued his amiodarone in the past which was being used for ventricular tachycardia because of potential pulmonary toxicity.  3 ischemic cardiomyopathy-LV function slightly improved on most recent echocardiogram.  Continue Entresto and carvedilol.  4 chronic systolic congestive heart failure-he is euvolemic.  Continue Lasix and eplerenone at present dose.    5 hypertension-blood pressure controlled.  Continue present medical regimen.  6 history of ventricular tachycardia-amiodarone discontinued previously due to potential lung toxicity.  We will continue mexiletine and beta-blocker at present dose.  7 hyperlipidemia-continue statin.  8 biventricular ICD-Per Dr. Caryl Comes.  Kirk Ruths, MD

## 2019-12-15 ENCOUNTER — Encounter: Payer: Self-pay | Admitting: Cardiology

## 2019-12-15 ENCOUNTER — Ambulatory Visit (INDEPENDENT_AMBULATORY_CARE_PROVIDER_SITE_OTHER): Payer: Medicare Other | Admitting: Cardiology

## 2019-12-15 ENCOUNTER — Other Ambulatory Visit: Payer: Self-pay

## 2019-12-15 VITALS — BP 122/68 | HR 71 | Ht 70.0 in | Wt 215.0 lb

## 2019-12-15 DIAGNOSIS — I10 Essential (primary) hypertension: Secondary | ICD-10-CM

## 2019-12-15 DIAGNOSIS — I255 Ischemic cardiomyopathy: Secondary | ICD-10-CM | POA: Diagnosis not present

## 2019-12-15 DIAGNOSIS — I2510 Atherosclerotic heart disease of native coronary artery without angina pectoris: Secondary | ICD-10-CM

## 2019-12-15 DIAGNOSIS — E7800 Pure hypercholesterolemia, unspecified: Secondary | ICD-10-CM

## 2019-12-15 DIAGNOSIS — I429 Cardiomyopathy, unspecified: Secondary | ICD-10-CM

## 2019-12-15 DIAGNOSIS — I480 Paroxysmal atrial fibrillation: Secondary | ICD-10-CM

## 2019-12-15 NOTE — Patient Instructions (Signed)
  Follow-Up: At [facility], you and your health needs are our priority.  As part of our continuing mission to provide you with exceptional heart care, we have created designated Provider Care Teams.  These Care Teams include your primary Cardiologist (physician) and Advanced Practice Providers (APPs -  Physician Assistants and Nurse Practitioners) who all work together to provide you with the care you need, when you need it.  We recommend signing up for the patient portal called "MyChart".  Sign up information is provided on this After Visit Summary.  MyChart is used to connect with patients for Virtual Visits (Telemedicine).  Patients are able to view lab/test results, encounter notes, upcoming appointments, etc.  Non-urgent messages can be sent to your provider as well.   To learn more about what you can do with MyChart, go to [email or web address].    Your next appointment:   6 month(s)  The format for your next appointment:   In Person  Provider:   Kirk Ruths, MD

## 2019-12-23 ENCOUNTER — Telehealth: Payer: Self-pay

## 2019-12-23 NOTE — Telephone Encounter (Signed)
Latitude alert received for increased AF burden from 8.4 hours to 11.3 hours of the time. [place] ~ Xarelto. Patient saw by Dr. Stanford Breed 12/15/19 and plan per note is to leave as d/t patient asymptomatic and VR's controlled. Sending to Dr. Caryl Comes for review and any recommendations.

## 2019-12-26 ENCOUNTER — Other Ambulatory Visit: Payer: Self-pay | Admitting: Cardiology

## 2019-12-28 NOTE — Telephone Encounter (Signed)
Looks like he has reverted to sinus

## 2020-01-03 ENCOUNTER — Ambulatory Visit (INDEPENDENT_AMBULATORY_CARE_PROVIDER_SITE_OTHER): Payer: Medicare Other

## 2020-01-03 DIAGNOSIS — I4729 Other ventricular tachycardia: Secondary | ICD-10-CM

## 2020-01-03 DIAGNOSIS — I472 Ventricular tachycardia: Secondary | ICD-10-CM

## 2020-01-03 LAB — CUP PACEART REMOTE DEVICE CHECK
Brady Statistic RA Percent Paced: 66 %
Brady Statistic RV Percent Paced: 98 %
Date Time Interrogation Session: 20211219030100
HighPow Impedance: 47 Ohm
Implantable Lead Implant Date: 20020527
Implantable Lead Implant Date: 20020527
Implantable Lead Implant Date: 20131212
Implantable Lead Location: 753858
Implantable Lead Location: 753859
Implantable Lead Location: 753860
Implantable Lead Model: 148
Implantable Lead Model: 4194
Implantable Lead Model: 6940
Implantable Lead Serial Number: 120244
Implantable Pulse Generator Implant Date: 20111207
Lead Channel Impedance Value: 512 Ohm
Lead Channel Impedance Value: 610 Ohm
Lead Channel Impedance Value: 833 Ohm
Lead Channel Pacing Threshold Amplitude: 0.6 V
Lead Channel Pacing Threshold Amplitude: 1 V
Lead Channel Pacing Threshold Amplitude: 1.7 V
Lead Channel Pacing Threshold Pulse Width: 0.3 ms
Lead Channel Pacing Threshold Pulse Width: 0.4 ms
Lead Channel Pacing Threshold Pulse Width: 1 ms
Lead Channel Setting Pacing Amplitude: 2 V
Lead Channel Setting Pacing Amplitude: 2.5 V
Lead Channel Setting Pacing Amplitude: 2.5 V
Lead Channel Setting Pacing Pulse Width: 0.4 ms
Lead Channel Setting Pacing Pulse Width: 1 ms
Lead Channel Setting Sensing Sensitivity: 0.5 mV
Lead Channel Setting Sensing Sensitivity: 1 mV
Pulse Gen Serial Number: 492371

## 2020-01-04 ENCOUNTER — Telehealth: Payer: Self-pay

## 2020-01-04 NOTE — Telephone Encounter (Signed)
Latitude alert received ERI reached 01/01/20. Also persistent AF since 12/26/19.   Called patient to discuss ERI / send manual transmission to assess if patient is still in AF. + OAC Xarelto.

## 2020-01-05 NOTE — Telephone Encounter (Signed)
Patient called in to let us know his ICD has been making noise in his chest everyday for about 10 mins. Patients device reached ERI and I asked patient to send [place] a manual transmission and the nurse will give him a call back once we receive it.

## 2020-01-05 NOTE — Telephone Encounter (Signed)
Returning phone call.   Advised device has reached ERI. Patients presenting today AF/ BiV Pace 70 bpm. Per Dr. Stanford Breed note on 12/15/19, patient is asymptomatic and medicated correctly, no changes. Will forward to Dr. Caryl Comes in regards to recent AF increase per trend. Patient remains asymptomatic today. Advised I will forward to scheduling and a scheduler will call to discuss gen change.

## 2020-01-11 NOTE — Progress Notes (Addendum)
Cardiology Office Note Date:  01/12/2020  Patient ID:  [name], [name] 1940/10/08, MRN [id number] PCP:  [name], MD  Cardiologist:  Dr. Stanford Breed Electrophysiologist: Dr. Caryl Comes    Chief Complaint:  device ERI  History of Present Illness: [name] is a 79 y.o. male with history of CAD (cardiac catheterization in October 2008 showed an occluded right coronary artery with left-to-right collateralization), chronic CHF (systolic), mixed ischemi/NICM, VT, AFib, COPD, HTN, HLD.  He comes in today to be seen for Dr. Caryl Comes, last seen by him April 2021, doing ok, discussed gait instability and recommended neurology eval.  No changes were made.  More recently saw Dr. Stanford Breed, he was suspect to be in AF that day and asymptomatic, given no symptoms, no changes made.  TODAY He feels very well. Reports excellent exertional capacity and denies any intolerances. No CP, palpitations or cardiac awareness f any kind.  He says he has been told he is having more AFib but does not tell any change at all. No SOB or DOE No dizzy spells, near syncope or syncope. No bleeding or signs of bleeding    Device information BSCi CRT-D 12/20/2009 LV lead extraction >> new implant in 2013 RA/RV leads are from 2002  AAD Mexiletine 2009 >> current Amiodarone d/c March 2019 2/2 pulmonary concerns/potential toxicity High resolution chest CT February 2020 showed mild pulmonary fibrosis, mild emphysema   Past Medical History:  Diagnosis Date  . Adenomatous colon polyp 2009; 06/2012   Colonoscopy 07/2007---Dr. Barron Schmid GI--Repeat 06/2012 showed one tubular adenoma w/out high grade dysplasia.  [name] AICD (automatic cardioverter/defibrillator) present    [employer]  . Atrial fibrillation ([name])   . Bilateral sensorineural hearing loss   . BPH (benign prostatic hypertrophy)    PSAs ok (followed by urologist): no LUTS  . CAD (coronary artery disease)    PTCA RCA 1997 (Inf/post MI);  . CHF  (congestive heart failure) ([place])   . Chronic constipation   . Chronic rhinitis    ?vasomotor (Dr. Erik Obey)  . COPD (chronic obstructive pulmonary disease) ([place])   . Diverticulosis of colon 2005   Noted on Colonoscopies in 2005 and 2009 and on CT 2010  . Gallstones 06/2014   No evidence of cholecystitis on abd u/s 06/2014  . GERD (gastroesophageal reflux disease)   . Hepatic steatosis 2010; 2016   Noted on CT abd 2010 and on u/s abd 04/2009 (transaminasemia) and u/s 06/2014.  [name] Hyperlipidemia   . Hypertension   . Ischemic cardiomyopathy   . Microscopic hematuria    Cysto neg 02/2009 except for BPH-likely has microhem from his renal stone dz  . Nephrolithiasis    No distal stones on CT 2010; stable 8 mm RLP stone, 63m LMP stone 10/2013 plain film.  Abd u/s 06/2014: 9 mm nonobstructing lower pole right renal stone.  03/2015 urol: stable nonobstructing renal calc by CT 12/2014 and KUB 03/2015.  .Marland KitchenObstructive sleep apnea    no cpap uses since weight loss of 50 pounds  . Presence of permanent cardiac pacemaker    [employer] . Squamous cell carcinoma of back   . Ventricular tachycardia ([facility]     Past Surgical History:  Procedure Laterality Date  . ATRIAL ABLATION SURGERY     flutter ablation 2002  . BREAST SURGERY Left 1990   "Tissue removed from breast"  . CARDIAC CATHETERIZATION    . carotid doppler  09/25/12   No signif stenosis  . COLONOSCOPY  N/A 07/13/2012   Diverticula ascending colon.  Polyp x 1-recall 5 yrs (Dr. Watt Climes) Procedure: COLONOSCOPY;  Surgeon: Jeryl Columbia, MD;  Location: [facility] ENDOSCOPY;  Service: Endoscopy;  Laterality: N/A; polypectomy x 1   . COLONOSCOPY WITH PROPOFOL N/A 08/18/2015   Procedure: COLONOSCOPY WITH PROPOFOL;  Surgeon: Mauri Pole, MD;  Location: [facility] ENDOSCOPY;  Service: Endoscopy;  Laterality: N/A;  . ESOPHAGOGASTRODUODENOSCOPY N/A 07/13/2012   Procedure: ESOPHAGOGASTRODUODENOSCOPY (EGD);  Surgeon: Jeryl Columbia, MD;  Location: [facility];  Service:  Endoscopy;  Laterality: N/A;  NORMAL  . EXTRACORPOREAL SHOCK WAVE LITHOTRIPSY    . HEMORRHOID BANDING    . ICD     a) Guidant Contak H170- Virl Axe, MD 07/25/06 (second device) b) Medtronic CRT-D  . INGUINAL HERNIA REPAIR Bilateral 1975  . LEAD REVISION Bilateral 12/26/2011   Procedure: LEAD REVISION;  Surgeon: Evans Lance, MD;  Location: [facility] CATH LAB;  Service: Cardiovascular;  Laterality: Bilateral;  . PFTs  02/2015   Mod restriction (interstitial), mod diffusion defect (Dr. Annamaria Boots)  . precancer area keratsis removed  05/2015    leftf orehead and left arm and right arm and back    Current Outpatient Medications  Medication Sig Dispense Refill  . antiseptic oral rinse (BIOTENE) LIQD 15 mLs by Mouth Rinse route as needed for dry mouth (TID).    [name] budesonide-formoterol (SYMBICORT) 160-4.5 MCG/ACT inhaler Inhale 2 puffs into the lungs 2 (two) times daily. 1 Inhaler 12  . carvedilol (COREG) 6.25 MG tablet Take 1 tablet (6.25 mg total) by mouth 2 (two) times daily with a meal. 180 tablet 3  . cholecalciferol (VITAMIN D) 1000 units tablet Take 1,000 Units by mouth daily.    [name] ENTRESTO 24-26 MG TAKE 1 TABLET BY MOUTH TWICE DAILY 180 tablet 3  . eplerenone (INSPRA) 25 MG tablet Take 1 tablet (25 mg total) by mouth daily. 30 tablet 11  . fluticasone (FLONASE) 50 MCG/ACT nasal spray SHAKE LIQUID AND USE 2 SPRAYS IN EACH NOSTRIL TWICE DAILY 32 g 12  . furosemide (LASIX) 40 MG tablet TAKE 2 TABLETS BY MOUTH EVERY DAY 90 tablet 3  . ipratropium (ATROVENT) 0.06 % nasal spray USE 2 SPRAYS IN EACH NOSTRIL THREE TIMES DAILY 15 mL 12  . loratadine (CLARITIN) 10 MG tablet Take 10 mg by mouth daily.    . Magnesium 250 MG TABS Take 1 tablet by mouth daily.    [name] mexiletine (MEXITIL) 150 MG capsule TAKE 2 CAPSULES(300 MG) BY MOUTH TWICE DAILY 360 capsule 2  . Multiple Vitamin (MULTIVITAMIN WITH MINERALS) TABS Take 1 tablet by mouth daily.    . nitroGLYCERIN (NITROSTAT) 0.4 MG SL tablet DISSOLVE 1 TABLET  UNDER THE TONGUE EVERY 5 MINUTES AS NEEDED FOR CHEST PAIN 25 tablet 5  . polyvinyl alcohol-povidone (HYPOTEARS) 1.4-0.6 % ophthalmic solution Place 1-2 drops into both eyes as needed (dry eyes).     . potassium chloride SA (KLOR-CON) 20 MEQ tablet TAKE 1 TABLET(20 MEQ) BY MOUTH DAILY 90 tablet 3  . Probiotic Product (PROBIOTIC DAILY PO) Take 1 tablet by mouth daily.    . rivaroxaban (XARELTO) 20 MG TABS tablet Take 1 tablet (20 mg total) by mouth daily with supper. 90 tablet 3  . rosuvastatin (CRESTOR) 40 MG tablet Take 1 tablet (40 mg total) by mouth daily. 90 tablet 3  . VENTOLIN HFA 108 (90 Base) MCG/ACT inhaler INHALE 1 TO 2 PUFFS INTO THE LUNGS EVERY 4 HOURS AS NEEDED FOR WHEEZING OR SHORTNESS OF BREATH  18 g 5  . Wheat Dextrin (BENEFIBER PO) Take 2 Scoops by mouth 2 (two) times daily.    [name] triamcinolone (KENALOG) 0.1 % Apply topically as needed.     No current facility-administered medications for this visit.    Allergies:   Sulfa antibiotics   Social History:  The patient  reports that he quit smoking about 40 years ago. His smoking use included cigarettes. He has a 30.00 pack-year smoking history. He has never used smokeless tobacco. He reports that he does not drink alcohol and does not use drugs.   Family History:  The patient's family history includes Breast cancer in his maternal grandmother; Coronary artery disease in his father; Gallstones in his mother; Goiter in his paternal grandmother; Heart attack in his father; Heart disease in his mother; Hypertension in his sister; Nephrolithiasis in his father and son; Other in his mother.  ROS:  Please see the history of present illness.    All other systems are reviewed and otherwise negative.   PHYSICAL EXAM:  VS:  BP 112/70   Pulse 70   Ht 5' 10" (1.778 m)   Wt 211 lb (95.7 kg)   SpO2 97%   BMI 30.28 kg/m  BMI: Body mass index is 30.28 kg/m. Well nourished, well developed, in no acute distress HEENT: normocephalic,  atraumatic Neck: no JVD, carotid bruits or masses Cardiac:  RRR (paced); no significant murmurs, no rubs, or gallops Lungs:  CTA b/l, no wheezing, rhonchi or rales Abd: soft, nontender MS: no deformity or atrophy Ext:  no edema Skin: warm and dry, no rash Neuro:  No gross deficits appreciated Psych: euthymic mood, full affect  ICD site is stable, no tethering or discomfort   EKG:  Done today and reviewed by myself shows  AFlutter, V paced  Device interrogation done today and reviewed by myself:  Battery reached ERI 01/01/20 Lead measurements stable AFib burden is up in the last 17mo 29% Current episode likely started 12/26 No VT  07/02/2019: TTE IMPRESSIONS  1. Left ventricular ejection fraction, by estimation, is 30 to 35%. The  left ventricle has moderately decreased function. The left ventricle  demonstrates regional wall motion abnormalities (see scoring  diagram/findings for description). The left  ventricular internal cavity size was moderately dilated. Left ventricular  diastolic parameters are consistent with Grade I diastolic dysfunction  (impaired relaxation).  2. Right ventricular systolic function is normal. The right ventricular  size is normal. There is normal pulmonary artery systolic pressure.  3. Left atrial size was moderately dilated.  4. The mitral valve is normal in structure. Mild mitral valve  regurgitation. No evidence of mitral stenosis.  5. The aortic valve is normal in structure. Aortic valve regurgitation is  not visualized. No aortic stenosis is present.  6. The inferior vena cava is normal in size with greater than 50%  respiratory variability, suggesting right atrial pressure of 3 mmHg.   Comparison(s): EF 20-25%.    12/27/2011 This demonstrates successful extraction of an old left ventricular lead, which had retracted back into the subclavian vein.  It demonstrates successful insertion of a new left ventricular lead into the  lateral vein of the left ventricle and successful pocket revision and contrast venography all without immediate procedure complication.   Recent Labs: 11/17/2019: ALT 24; BUN 12; Creatinine, Ser 0.86; Hemoglobin 14.7; Platelets 288.0; Potassium 3.8; Sodium 137; TSH 1.96  11/17/2019: Cholesterol 127; HDL 36.30; LDL Cholesterol 70; Total CHOL/HDL Ratio 3; Triglycerides 102.0; VLDL 20.4  CrCl cannot be calculated (Patient's most recent lab result is older than the maximum 21 days allowed.).   Wt Readings from Last 3 Encounters:  01/12/20 211 lb (95.7 kg)  12/15/19 215 lb (97.5 kg)  11/17/19 213 lb 6.4 oz (96.8 kg)     Other studies reviewed: Additional studies/records reviewed today include: summarized above  ASSESSMENT AND PLAN:  1. CRT-D     [facility] at [facility]  We discussed generator change procedure, potential risks, benefits, he is agreeable to proceed Hold xarelto 2 days No diuretics AM of procedure  2. CAD     No anginal symptoms     On BB, statin, no ASA w/xarelto     C/w Dr. Stanford Breed  3. Chronic CHF 4. Mixed ischemic/NICM     No symptoms or exam findings to suggest folume OL     On BB, entresto, lasix, Inspra, K+  5. Persistent Afib     CHA2DS2Vasc is 5, on xarelto, appropriately dosed     Burden is up, historically failed amio with pulmonary tox  Will plan to get passed his gen change He is unaware of his AF, not certain we need to consider AAD, will send note and defer to Dr. Caryl Comes  7. VT     On mexiletine     None noted  8. HTN     Looks good    Disposition: F/u with gen change with routine post procedure follow up.  Current medicines are reviewed at length with the patient today.  The patient did not have any concerns regarding medicines.  Venetia Night, PA-C 01/12/2020 5:08 PM     [address] [place] [address] [phone] (office)  [phone] (fax)

## 2020-01-12 ENCOUNTER — Ambulatory Visit (INDEPENDENT_AMBULATORY_CARE_PROVIDER_SITE_OTHER): Payer: Medicare Other | Admitting: Physician Assistant

## 2020-01-12 ENCOUNTER — Encounter: Payer: Self-pay | Admitting: Physician Assistant

## 2020-01-12 ENCOUNTER — Encounter: Payer: Self-pay | Admitting: *Deleted

## 2020-01-12 ENCOUNTER — Other Ambulatory Visit: Payer: Self-pay

## 2020-01-12 VITALS — BP 112/70 | HR 70 | Ht 70.0 in | Wt 211.0 lb

## 2020-01-12 DIAGNOSIS — I2510 Atherosclerotic heart disease of native coronary artery without angina pectoris: Secondary | ICD-10-CM | POA: Diagnosis not present

## 2020-01-12 DIAGNOSIS — I472 Ventricular tachycardia, unspecified: Secondary | ICD-10-CM

## 2020-01-12 DIAGNOSIS — I428 Other cardiomyopathies: Secondary | ICD-10-CM | POA: Diagnosis not present

## 2020-01-12 DIAGNOSIS — I255 Ischemic cardiomyopathy: Secondary | ICD-10-CM | POA: Diagnosis not present

## 2020-01-12 DIAGNOSIS — I5022 Chronic systolic (congestive) heart failure: Secondary | ICD-10-CM

## 2020-01-12 DIAGNOSIS — Z4502 Encounter for adjustment and management of automatic implantable cardiac defibrillator: Secondary | ICD-10-CM

## 2020-01-12 DIAGNOSIS — I10 Essential (primary) hypertension: Secondary | ICD-10-CM | POA: Diagnosis not present

## 2020-01-12 NOTE — Patient Instructions (Addendum)
Medication Instructions:   Your physician recommends that you continue on your current medications as directed. Please refer to the Current Medication list given to you today.   *If you need a refill on your cardiac medications before your next appointment, please call your pharmacy*   Lab Work:  RETURN FOR LABS BMET AND CBC ([facility])  COVID SCREENING( DIRECTIONS/MAP GIVEN ) DATE :  02-11-20  TIME: 10:35 AM   If you have labs (blood work) drawn today and your tests are completely normal, you will receive your results only by: [name] MyChart Message (if you have MyChart) OR . A paper copy in the mail If you have any lab test that is abnormal or we need to change your treatment, we will call you to review the results.   Testing/Procedures: SEE LETTER FOR GEN CHANGE     Follow-Up: At [facility], you and your health needs are our priority.  As part of our continuing mission to provide you with exceptional heart care, we have created designated Provider Care Teams.  These Care Teams include your primary Cardiologist (physician) and Advanced Practice Providers (APPs -  Physician Assistants and Nurse Practitioners) who all work together to provide you with the care you need, when you need it.  We recommend signing up for the patient portal called "MyChart".  Sign up information is provided on this After Visit Summary.  MyChart is used to connect with patients for Virtual Visits (Telemedicine).  Patients are able to view lab/test results, encounter notes, upcoming appointments, etc.  Non-urgent messages can be sent to your provider as well.   To learn more about what you can do with MyChart, go to [email or web address].    Your next appointment:  AFTER 02-14-2020  14 DAY WOUND CHECK  3 month(s)  PHYS DEFIB CHK   The format for your next appointment:   In Person  Provider:   Virl Axe, MD    Other Instructions

## 2020-01-17 NOTE — Progress Notes (Signed)
Remote ICD transmission.

## 2020-01-18 ENCOUNTER — Other Ambulatory Visit: Payer: Self-pay

## 2020-01-18 DIAGNOSIS — Z4502 Encounter for adjustment and management of automatic implantable cardiac defibrillator: Secondary | ICD-10-CM | POA: Diagnosis not present

## 2020-01-19 LAB — CBC
Hematocrit: 45.8 % (ref 37.5–51.0)
Hemoglobin: 15.5 g/dL (ref 13.0–17.7)
MCH: 33.5 pg — ABNORMAL HIGH (ref 26.6–33.0)
MCHC: 33.8 g/dL (ref 31.5–35.7)
MCV: 99 fL — ABNORMAL HIGH (ref 79–97)
Platelets: 309 10*3/uL (ref 150–450)
RBC: 4.62 x10E6/uL (ref 4.14–5.80)
RDW: 12.5 % (ref 11.6–15.4)
WBC: 7.9 10*3/uL (ref 3.4–10.8)

## 2020-01-19 LAB — BASIC METABOLIC PANEL
BUN/Creatinine Ratio: 13 (ref 10–24)
BUN: 13 mg/dL (ref 8–27)
CO2: 29 mmol/L (ref 20–29)
Calcium: 9.2 mg/dL (ref 8.6–10.2)
Chloride: 100 mmol/L (ref 96–106)
Creatinine, Ser: 1 mg/dL (ref 0.76–1.27)
GFR calc Af Amer: 83 mL/min/{1.73_m2} (ref >59)
GFR calc non Af Amer: 72 mL/min/{1.73_m2} (ref >59)
Glucose: 110 mg/dL — ABNORMAL HIGH (ref 65–99)
Potassium: 4.1 mmol/L (ref 3.5–5.2)
Sodium: 139 mmol/L (ref 134–144)

## 2020-01-24 ENCOUNTER — Other Ambulatory Visit: Payer: Self-pay | Admitting: Cardiology

## 2020-01-24 DIAGNOSIS — I059 Rheumatic mitral valve disease, unspecified: Secondary | ICD-10-CM

## 2020-01-24 DIAGNOSIS — I480 Paroxysmal atrial fibrillation: Secondary | ICD-10-CM

## 2020-01-24 DIAGNOSIS — I429 Cardiomyopathy, unspecified: Secondary | ICD-10-CM

## 2020-01-24 DIAGNOSIS — Z95810 Presence of automatic (implantable) cardiac defibrillator: Secondary | ICD-10-CM

## 2020-02-04 ENCOUNTER — Ambulatory Visit (INDEPENDENT_AMBULATORY_CARE_PROVIDER_SITE_OTHER): Payer: Medicare Other

## 2020-02-04 DIAGNOSIS — I4729 Other ventricular tachycardia: Secondary | ICD-10-CM

## 2020-02-04 DIAGNOSIS — I472 Ventricular tachycardia, unspecified: Secondary | ICD-10-CM

## 2020-02-04 LAB — CUP PACEART REMOTE DEVICE CHECK
Brady Statistic RA Percent Paced: 1 %
Brady Statistic RV Percent Paced: 96 %
Date Time Interrogation Session: 20220121030100
HighPow Impedance: 46 Ohm
Implantable Lead Implant Date: 20020527
Implantable Lead Implant Date: 20020527
Implantable Lead Implant Date: 20131212
Implantable Lead Location: 753858
Implantable Lead Location: 753859
Implantable Lead Location: 753860
Implantable Lead Model: 148
Implantable Lead Model: 4194
Implantable Lead Model: 6940
Implantable Lead Serial Number: 120244
Implantable Pulse Generator Implant Date: 20111207
Lead Channel Impedance Value: 511 Ohm
Lead Channel Impedance Value: 602 Ohm
Lead Channel Impedance Value: 751 Ohm
Lead Channel Pacing Threshold Amplitude: 0.6 V
Lead Channel Pacing Threshold Amplitude: 1.3 V
Lead Channel Pacing Threshold Amplitude: 1.7 V
Lead Channel Pacing Threshold Pulse Width: 0.3 ms
Lead Channel Pacing Threshold Pulse Width: 0.4 ms
Lead Channel Pacing Threshold Pulse Width: 1 ms
Lead Channel Setting Pacing Amplitude: 2 V
Lead Channel Setting Pacing Amplitude: 2.5 V
Lead Channel Setting Pacing Amplitude: 2.5 V
Lead Channel Setting Pacing Pulse Width: 0.4 ms
Lead Channel Setting Pacing Pulse Width: 1 ms
Lead Channel Setting Sensing Sensitivity: 0.5 mV
Lead Channel Setting Sensing Sensitivity: 1 mV
Pulse Gen Serial Number: 492371

## 2020-02-11 ENCOUNTER — Other Ambulatory Visit (HOSPITAL_COMMUNITY)
Admission: RE | Admit: 2020-02-11 | Discharge: 2020-02-11 | Disposition: A | Discharge status: Home / Self Care | Payer: Medicare Other | Source: Ambulatory Visit | Attending: Internal Medicine | Admitting: Internal Medicine

## 2020-02-11 DIAGNOSIS — Z20822 Contact with and (suspected) exposure to covid-19: Secondary | ICD-10-CM | POA: Insufficient documentation

## 2020-02-11 DIAGNOSIS — Z01812 Encounter for preprocedural laboratory examination: Secondary | ICD-10-CM | POA: Diagnosis not present

## 2020-02-11 LAB — SARS CORONAVIRUS 2 (TAT 6-24 HRS): SARS Coronavirus 2: NEGATIVE

## 2020-02-11 NOTE — Progress Notes (Signed)
Instructed patient on the following items: Arrival time 0900 Nothing to eat or drink after midnight No meds AM of procedure Responsible person to drive you home and stay with you for 24 hrs Wash with special soap night before and morning of procedure If on anti-coagulant drug instructions Xarelto- last dose in 1/28

## 2020-02-14 ENCOUNTER — Ambulatory Visit (HOSPITAL_COMMUNITY)
Admission: RE | Admit: 2020-02-14 | Discharge: 2020-02-14 | Disposition: A | Discharge status: Home / Self Care | Payer: Medicare Other | Source: Ambulatory Visit | Attending: Internal Medicine | Admitting: Internal Medicine

## 2020-02-14 ENCOUNTER — Ambulatory Visit (HOSPITAL_COMMUNITY)
Admission: RE | Disposition: A | Discharge status: Home / Self Care | Payer: Self-pay | Source: Ambulatory Visit | Attending: Internal Medicine

## 2020-02-14 ENCOUNTER — Other Ambulatory Visit: Payer: Self-pay

## 2020-02-14 ENCOUNTER — Encounter (HOSPITAL_COMMUNITY): Payer: Self-pay | Admitting: Internal Medicine

## 2020-02-14 DIAGNOSIS — R2689 Other abnormalities of gait and mobility: Secondary | ICD-10-CM | POA: Diagnosis not present

## 2020-02-14 DIAGNOSIS — I4819 Other persistent atrial fibrillation: Secondary | ICD-10-CM | POA: Insufficient documentation

## 2020-02-14 DIAGNOSIS — Z882 Allergy status to sulfonamides status: Secondary | ICD-10-CM | POA: Diagnosis not present

## 2020-02-14 DIAGNOSIS — I110 Hypertensive heart disease with heart failure: Secondary | ICD-10-CM | POA: Insufficient documentation

## 2020-02-14 DIAGNOSIS — Z4502 Encounter for adjustment and management of automatic implantable cardiac defibrillator: Secondary | ICD-10-CM | POA: Diagnosis not present

## 2020-02-14 DIAGNOSIS — J8410 Pulmonary fibrosis, unspecified: Secondary | ICD-10-CM | POA: Diagnosis not present

## 2020-02-14 DIAGNOSIS — Z8249 Family history of ischemic heart disease and other diseases of the circulatory system: Secondary | ICD-10-CM | POA: Insufficient documentation

## 2020-02-14 DIAGNOSIS — I472 Ventricular tachycardia: Secondary | ICD-10-CM | POA: Insufficient documentation

## 2020-02-14 DIAGNOSIS — I252 Old myocardial infarction: Secondary | ICD-10-CM | POA: Insufficient documentation

## 2020-02-14 DIAGNOSIS — I428 Other cardiomyopathies: Secondary | ICD-10-CM | POA: Insufficient documentation

## 2020-02-14 DIAGNOSIS — Z955 Presence of coronary angioplasty implant and graft: Secondary | ICD-10-CM | POA: Insufficient documentation

## 2020-02-14 DIAGNOSIS — Z87891 Personal history of nicotine dependence: Secondary | ICD-10-CM | POA: Insufficient documentation

## 2020-02-14 DIAGNOSIS — I5022 Chronic systolic (congestive) heart failure: Secondary | ICD-10-CM | POA: Diagnosis not present

## 2020-02-14 DIAGNOSIS — I2510 Atherosclerotic heart disease of native coronary artery without angina pectoris: Secondary | ICD-10-CM | POA: Diagnosis not present

## 2020-02-14 DIAGNOSIS — I420 Dilated cardiomyopathy: Secondary | ICD-10-CM

## 2020-02-14 DIAGNOSIS — J449 Chronic obstructive pulmonary disease, unspecified: Secondary | ICD-10-CM | POA: Insufficient documentation

## 2020-02-14 DIAGNOSIS — E785 Hyperlipidemia, unspecified: Secondary | ICD-10-CM | POA: Insufficient documentation

## 2020-02-14 HISTORY — PX: ICD GENERATOR CHANGEOUT: EP1231

## 2020-02-14 SURGERY — ICD GENERATOR CHANGEOUT

## 2020-02-14 MED ORDER — SODIUM CHLORIDE 0.9 % IV SOLN
INTRAVENOUS | Status: AC
  Filled 2020-02-14: qty 2

## 2020-02-14 MED ORDER — POVIDONE-IODINE 10 % EX SWAB
2.0000 "application " | Freq: Once | CUTANEOUS | Status: AC
  Administered 2020-02-14: 2 via TOPICAL

## 2020-02-14 MED ORDER — LIDOCAINE HCL (PF) 1 % IJ SOLN
INTRAMUSCULAR | Status: DC | PRN
  Administered 2020-02-14: 30 mL

## 2020-02-14 MED ORDER — MIDAZOLAM HCL 5 MG/5ML IJ SOLN
INTRAMUSCULAR | Status: DC | PRN
  Administered 2020-02-14 (×2): 1 mg via INTRAVENOUS

## 2020-02-14 MED ORDER — CEFAZOLIN SODIUM-DEXTROSE 2-4 GM/100ML-% IV SOLN
INTRAVENOUS | Status: AC
  Filled 2020-02-14: qty 100

## 2020-02-14 MED ORDER — SODIUM CHLORIDE 0.9 % IV SOLN: INTRAVENOUS | Status: DC

## 2020-02-14 MED ORDER — ACETAMINOPHEN 325 MG PO TABS
325.0000 mg | ORAL_TABLET | ORAL | Status: DC | PRN
Start: 2020-02-14 — End: 2020-02-14

## 2020-02-14 MED ORDER — CHLORHEXIDINE GLUCONATE 4 % EX LIQD: 4.0000 "application " | Freq: Once | CUTANEOUS | Status: DC

## 2020-02-14 MED ORDER — MIDAZOLAM HCL 5 MG/5ML IJ SOLN
INTRAMUSCULAR | Status: AC
  Filled 2020-02-14: qty 5

## 2020-02-14 MED ORDER — SODIUM CHLORIDE 0.9 % IV SOLN
80.0000 mg | INTRAVENOUS | Status: AC
  Administered 2020-02-14: 80 mg

## 2020-02-14 MED ORDER — CEFAZOLIN SODIUM-DEXTROSE 2-4 GM/100ML-% IV SOLN
2.0000 g | INTRAVENOUS | Status: AC
  Administered 2020-02-14: 2 g via INTRAVENOUS

## 2020-02-14 MED ORDER — FENTANYL CITRATE (PF) 100 MCG/2ML IJ SOLN
INTRAMUSCULAR | Status: DC | PRN
  Administered 2020-02-14 (×2): 25 ug via INTRAVENOUS

## 2020-02-14 MED ORDER — FENTANYL CITRATE (PF) 100 MCG/2ML IJ SOLN
INTRAMUSCULAR | Status: AC
  Filled 2020-02-14: qty 2

## 2020-02-14 MED ORDER — LIDOCAINE HCL 1 % IJ SOLN
INTRAMUSCULAR | Status: AC
  Filled 2020-02-14: qty 60

## 2020-02-14 SURGICAL SUPPLY — 8 items
CABLE SURGICAL S-101-97-12 (CABLE) ×2 IMPLANT
DEVICE DISSECT PLASMABLAD 3.0S (MISCELLANEOUS) ×1 IMPLANT
HEMOSTAT SURGICEL 2X4 FIBR (HEMOSTASIS) ×2 IMPLANT
ICD MOMENTUM G125 (ICD Generator) ×2 IMPLANT
PAD PRO RADIOLUCENT 2001M-C (PAD) ×2 IMPLANT
PLASMABLADE 3.0S (MISCELLANEOUS) ×2
POUCH AIGIS-R ANTIBACT ICD (Mesh General) ×2 IMPLANT
TRAY PACEMAKER INSERTION (PACKS) ×2 IMPLANT

## 2020-02-14 NOTE — H&P (Signed)
Patient Care Team: [name], MD as PCP - General (Family Medicine) Festus Aloe, MD as Consulting Physician (Urology) Michael Boston, MD as Consulting Physician (General Surgery) [name], MD as Consulting Physician (Cardiology) Jodi Marble, MD as Consulting Physician (Otolaryngology) Deboraha Sprang, MD as Consulting Physician (Cardiology) Luberta Mutter, MD as Consulting Physician (Ophthalmology) Lamonte Sakai Rose Fillers, MD as Consulting Physician (Pulmonary Disease) Mauri Pole, MD as Consulting Physician (Gastroenterology) [name], [facility] as Pharmacist (Pharmacist)   HPI  [name] is a 80 y.o. male admitted for change a [employer]CRT-D.  His device is at [facility].  History of ventricular tachycardia in the setting of an ischemic/nonischemic cardiomyopathy for which he previously took amiodarone complicated by concern for lung toxicity is subsequently discontinued.  He has been managed more recently with mexiletine.  No recent ICD discharges.  Some gait instability  More recently has developed atrial fibrillation-persistent; primary cardiologist has elected not to cardiovert  Functional status is stable.  Mild dyspnea, no edema no chest pain   Unaware of his atrial fibrillation      DATE TEST EF   10/08 Cath    20 % T RCA  12/13 Cath  <20% T RCA   1/17 Echo   20-25 %   6/21 Echo  30.35%        Date Cr K Hgb LDL  2008      11/17        6/18 0.95 4.0 15.2 162  5/19 0.95 4.0     10/20 0.87 4.1 14.2   1/22 1.00 4.1 15.5      Thromboembolic risk factors ( age -45, HTN-1, Vasc disease -1, CHF-1) for a CHADSVASc Score of  >= 5     Records and Results Reviewed   Past Medical History:  Diagnosis Date  . Adenomatous colon polyp 2009; 06/2012   Colonoscopy 07/2007---Dr. Barron Schmid GI--Repeat 06/2012 showed one tubular adenoma w/out high grade dysplasia.  [name] AICD (automatic cardioverter/defibrillator)  present    [employer]  . Atrial fibrillation ([place])   . Bilateral sensorineural hearing loss   . BPH (benign prostatic hypertrophy)    PSAs ok (followed by urologist): no LUTS  . CAD (coronary artery disease)    PTCA RCA 1997 (Inf/post MI);  . CHF (congestive heart failure) ([place])   . Chronic constipation   . Chronic rhinitis    ?vasomotor (Dr. Erik Obey)  . COPD (chronic obstructive pulmonary disease) ([place])   . Diverticulosis of colon 2005   Noted on Colonoscopies in 2005 and 2009 and on CT 2010  . Gallstones 06/2014   No evidence of cholecystitis on abd u/s 06/2014  . GERD (gastroesophageal reflux disease)   . Hepatic steatosis 2010; 2016   Noted on CT abd 2010 and on u/s abd 04/2009 (transaminasemia) and u/s 06/2014.  [name] Hyperlipidemia   . Hypertension   . Ischemic cardiomyopathy   . Microscopic hematuria    Cysto neg 02/2009 except for BPH-likely has microhem from his renal stone dz  . Nephrolithiasis    No distal stones on CT 2010; stable 8 mm RLP stone, 80m LMP stone 10/2013 plain film.  Abd u/s 06/2014: 9 mm nonobstructing lower pole right renal stone.  03/2015 urol: stable nonobstructing renal calc by CT 12/2014 and KUB 03/2015.  .Marland KitchenObstructive sleep apnea    no cpap uses since weight loss of 50 pounds  . Presence of permanent cardiac pacemaker    [employer] .  Squamous cell carcinoma of back   . Ventricular tachycardia [facility])     Past Surgical History:  Procedure Laterality Date  . ATRIAL ABLATION SURGERY     flutter ablation 2002  . BREAST SURGERY Left 1990   "Tissue removed from breast"  . CARDIAC CATHETERIZATION    . carotid doppler  09/25/12   No signif stenosis  . COLONOSCOPY N/A 07/13/2012   Diverticula ascending colon.  Polyp x 1-recall 5 yrs (Dr. Watt Climes) Procedure: COLONOSCOPY;  Surgeon: Jeryl Columbia, MD;  Location: [facility] ENDOSCOPY;  Service: Endoscopy;  Laterality: N/A; polypectomy x 1   . COLONOSCOPY WITH PROPOFOL N/A 08/18/2015   Procedure: COLONOSCOPY WITH  PROPOFOL;  Surgeon: Mauri Pole, MD;  Location: [facility] ENDOSCOPY;  Service: Endoscopy;  Laterality: N/A;  . ESOPHAGOGASTRODUODENOSCOPY N/A 07/13/2012   Procedure: ESOPHAGOGASTRODUODENOSCOPY (EGD);  Surgeon: Jeryl Columbia, MD;  Location: [facility];  Service: Endoscopy;  Laterality: N/A;  NORMAL  . EXTRACORPOREAL SHOCK WAVE LITHOTRIPSY    . HEMORRHOID BANDING    . ICD     a) Guidant Contak H170- Virl Axe, MD 07/25/06 (second device) b) Medtronic CRT-D  . INGUINAL HERNIA REPAIR Bilateral 1975  . LEAD REVISION Bilateral 12/26/2011   Procedure: LEAD REVISION;  Surgeon: Evans Lance, MD;  Location: [facility] C[facility];  Service: Cardiovascular;  Laterality: Bilateral;  . PFTs  02/2015   Mod restriction (interstitial), mod diffusion defect (Dr. Annamaria Boots)  . precancer area keratsis removed  05/2015    leftf orehead and left arm and right arm and back    Current Facility-Administered Medications  Medication Dose Route Frequency Provider Last Rate Last Admin  . 0.9 %  sodium chloride infusion   Intravenous Continuous Deboraha Sprang, MD 50 mL/hr at 02/14/20 1159 New Bag at 02/14/20 1159  . ceFAZolin (ANCEF) IVPB 2g/100 mL premix  2 g Intravenous On Call Deboraha Sprang, MD      . chlorhexidine (HIBICLENS) 4 % liquid 4 application  4 application Topical Once Deboraha Sprang, MD      . gentamicin (GARAMYCIN) 80 mg in sodium chloride 0.9 % 500 mL irrigation  80 mg Irrigation On Call Deboraha Sprang, MD        Allergies  Allergen Reactions  . Sulfa Antibiotics Rash      Social History   Tobacco Use  . Smoking status: Former Smoker    Packs/day: 2.00    Years: 15.00    Pack years: 30.00    Types: Cigarettes    Quit date: 01/15/1980    Years since quitting: 40.1  . Smokeless tobacco: Never Used  Vaping Use  . Vaping Use: Never used  Substance Use Topics  . Alcohol use: No    Alcohol/week: 0.0 standard drinks  . Drug use: No     Family History  Problem Relation Age of Onset  . Heart  disease Mother   . Other Mother        colon surgery for fistula  . Gallstones Mother   . Breast cancer Maternal Grandmother   . Coronary artery disease Father        family hx of  . Heart attack Father        4 stents/ pacemaker  . Nephrolithiasis Father   . Hypertension Sister   . Goiter Paternal Grandmother   . Nephrolithiasis Son   . Colon cancer Neg Hx      Current Meds  Medication Sig  . antiseptic oral rinse (BIOTENE) LIQD  15 mLs by Mouth Rinse route in the morning and at bedtime.  . budesonide-formoterol (SYMBICORT) 160-4.5 MCG/ACT inhaler Inhale 2 puffs into the lungs 2 (two) times daily.  . carvedilol (COREG) 6.25 MG tablet Take 1 tablet (6.25 mg total) by mouth 2 (two) times daily with a meal.  . cholecalciferol (VITAMIN D) 1000 units tablet Take 1,000 Units by mouth every evening.  [name] ENTRESTO 24-26 MG TAKE 1 TABLET BY MOUTH TWICE DAILY (Patient taking differently: Take 1 tablet by mouth 2 (two) times daily.)  . eplerenone (INSPRA) 25 MG tablet Take 1 tablet (25 mg total) by mouth daily. (Patient taking differently: Take 25 mg by mouth daily at 12 noon.)  . fluticasone (FLONASE) 50 MCG/ACT nasal spray SHAKE LIQUID AND USE 2 SPRAYS IN EACH NOSTRIL TWICE DAILY (Patient taking differently: Place 2 sprays into both nostrils in the morning and at bedtime.)  . furosemide (LASIX) 40 MG tablet TAKE 2 TABLETS BY MOUTH EVERY DAY (Patient taking differently: Take 80 mg by mouth daily.)  . ipratropium (ATROVENT) 0.06 % nasal spray USE 2 SPRAYS IN EACH NOSTRIL THREE TIMES DAILY (Patient taking differently: Place 2 sprays into both nostrils in the morning and at bedtime. USE 2 SPRAYS IN EACH NOSTRIL THREE TIMES DAILY)  . loratadine (CLARITIN) 10 MG tablet Take 10 mg by mouth daily.  . Magnesium 250 MG TABS Take 250 mg by mouth every evening.  . mexiletine (MEXITIL) 150 MG capsule TAKE 2 CAPSULES(300 MG) BY MOUTH TWICE DAILY (Patient taking differently: Take 300 mg by mouth 2 (two) times  daily.)  . Multiple Vitamin (MULTIVITAMIN WITH MINERALS) TABS Take 1 tablet by mouth daily.  . nitroGLYCERIN (NITROSTAT) 0.4 MG SL tablet DISSOLVE 1 TABLET UNDER THE TONGUE EVERY 5 MINUTES AS NEEDED FOR CHEST PAIN (Patient taking differently: Place 0.4 mg under the tongue every 5 (five) minutes x 3 doses as needed for chest pain.)  . polyvinyl alcohol-povidone (HYPOTEARS) 1.4-0.6 % ophthalmic solution Place 1-2 drops into both eyes as needed (dry eyes).   . potassium chloride SA (KLOR-CON) 20 MEQ tablet TAKE 1 TABLET(20 MEQ) BY MOUTH DAILY (Patient taking differently: Take 20 mEq by mouth daily.)  . Probiotic Product (PROBIOTIC DAILY PO) Take 1 capsule by mouth every evening.  . rivaroxaban (XARELTO) 20 MG TABS tablet Take 1 tablet (20 mg total) by mouth daily with supper.  . rosuvastatin (CRESTOR) 40 MG tablet Take 1 tablet (40 mg total) by mouth daily. (Patient taking differently: Take 40 mg by mouth every evening.)  . triamcinolone (KENALOG) 0.1 % Apply 1 application topically 2 (two) times daily as needed (skin irritation).  . VENTOLIN HFA 108 (90 Base) MCG/ACT inhaler INHALE 1 TO 2 PUFFS INTO THE LUNGS EVERY 4 HOURS AS NEEDED FOR WHEEZING OR SHORTNESS OF BREATH (Patient taking differently: Inhale 1-2 puffs into the lungs every 4 (four) hours as needed for wheezing or shortness of breath.)  . Wheat Dextrin (BENEFIBER PO) Take 2 Scoops by mouth 2 (two) times daily.     Review of Systems negative except from HPI and PMH  Physical Exam BP 125/85   Pulse 70   Temp 97.6 F (36.4 C) (Oral)   Ht _0  (1.778 m)   Wt 93.4 kg   SpO2 98%   BMI 29.56 kg/m  Well developed and well nourished in no acute distress HENT normal E scleral and icterus clear Neck Supple JVP flat; carotids brisk and full Clear to ausculation Regular rate and rhythm, no murmurs gallops or  rub Soft with active bowel sounds No clubbing cyanosis  Edema Alert and oriented, grossly normal motor and sensory  function Skin Warm and Dry    Assessment and  Plan  Ventricular tachycardia  Ischemic and nonischemic cardiomyopathy  Congestive heart failure-chronic-systolic  CRT-D-[employer] at [facility]   COPD pulm fibrosis  Atrial fibrillation  Gait instability  The pt admitted for ICD generator change CRT;  No ventricular arrhythmia intercurrently  AFib persistent with election to forgo efforts to restore sinus >>permanent     Have reviewed the potential benefits and risks of ICD implantation including but not limited to death, perforation of heart or lung, lead dislodgement, infection,  device malfunction and inappropriate shocks.  The patient  expresses understanding  and are willing to proceed.

## 2020-02-14 NOTE — Discharge Instructions (Signed)
Implantable Cardiac Device Battery Change, Care After  This sheet gives you information about how to care for yourself after your procedure. Your health care provider may also give you more specific instructions. If you have problems or questions, contact your health care provider. What can I expect after the procedure? After your procedure, it is common to have:  Pain or soreness at the site where the cardiac device was inserted.  Swelling at the site where the cardiac device was inserted.  You should received an information card for your new device in 4-8 weeks. Follow these instructions at home: Incision care   Keep the incision clean and dry. ? Do not take baths, swim, or use a hot tub until after your wound check.  ? Do not shower for at least 7 days, or as directed by your health care provider. ? Pat the area dry with a clean towel. Do not rub the area. This may cause bleeding.  Follow instructions from your health care provider about how to take care of your incision. Make sure you: ? Leave stitches (sutures), skin glue, or adhesive strips in place. These skin closures may need to stay in place for 2 weeks or longer. If adhesive strip edges start to loosen and curl up, you may trim the loose edges. Do not remove adhesive strips completely unless your health care provider tells you to do that.  Check your incision area every day for signs of infection. Check for: ? More redness, swelling, or pain. ? More fluid or blood. ? Warmth. ? Pus or a bad smell. Activity  Do not lift anything that is heavier than 10 lb (4.5 kg) until your health care provider says it is okay to do so.  For the first week, or as long as told by your health care provider: ? Avoid lifting your affected arm higher than your shoulder. ? After 1 week, Be gentle when you move your arms over your head. It is okay to raise your arm to comb your hair. ? Avoid strenuous exercise.  Ask your health care provider  when it is okay to: ? Resume your normal activities. ? Return to work or school. ? Resume sexual activity. Eating and drinking  Eat a heart-healthy diet. This should include plenty of fresh fruits and vegetables, whole grains, low-fat dairy products, and lean protein like chicken and fish.  Limit alcohol intake to no more than 1 drink a day for non-pregnant women and 2 drinks a day for men. One drink equals 12 oz of beer, 5 oz of wine, or 1 oz of hard liquor.  Check ingredients and nutrition facts on packaged foods and beverages. Avoid the following types of food: ? Food that is high in salt (sodium). ? Food that is high in saturated fat, like full-fat dairy or red meat. ? Food that is high in trans fat, like fried food. ? Food and drinks that are high in sugar. Lifestyle  Do not use any products that contain nicotine or tobacco, such as cigarettes and e-cigarettes. If you need help quitting, ask your health care provider.  Take steps to manage and control your weight.  Once cleared, get regular exercise. Aim for 150 minutes of moderate-intensity exercise (such as walking or yoga) or 75 minutes of vigorous exercise (such as running or swimming) each week.  Manage other health problems, such as diabetes or high blood pressure. Ask your health care provider how you can manage these conditions. General instructions  Do   not drive for 24 hours after your procedure if you were given a medicine to help you relax (sedative).  Take over-the-counter and prescription medicines only as told by your health care provider.  Avoid putting pressure on the area where the cardiac device was placed.  If you need an MRI after your cardiac device has been placed, be sure to tell the health care provider who orders the MRI that you have a cardiac device.  Avoid close and prolonged exposure to electrical devices that have strong magnetic fields. These include: ? Cell phones. Avoid keeping them in a  pocket near the cardiac device, and try using the ear opposite the cardiac device. ? MP3 players. ? Household appliances, like microwaves. ? Metal detectors. ? Electric generators. ? High-tension wires.  Keep all follow-up visits as directed by your health care provider. This is important. Contact a health care provider if:  You have pain at the incision site that is not relieved by over-the-counter or prescription medicines.  You have any of these around your incision site or coming from it: ? More redness, swelling, or pain. ? Fluid or blood. ? Warmth to the touch. ? Pus or a bad smell.  You have a fever.  You feel brief, occasional palpitations, light-headedness, or any symptoms that you think might be related to your heart. Get help right away if:  You experience chest pain that is different from the pain at the cardiac device site.  You develop a red streak that extends above or below the incision site.  You experience shortness of breath.  You have palpitations or an irregular heartbeat.  You have light-headedness that does not go away quickly.  You faint or have dizzy spells.  Your pulse suddenly drops or increases rapidly and does not return to normal.  You begin to gain weight and your legs and ankles swell. Summary  After your procedure, it is common to have pain, soreness, and some swelling where the cardiac device was inserted.  Make sure to keep your incision clean and dry. Follow instructions from your health care provider about how to take care of your incision.  Check your incision every day for signs of infection, such as more pain or swelling, pus or a bad smell, warmth, or leaking fluid and blood.  Avoid strenuous exercise and lifting your left arm higher than your shoulder for 2 weeks, or as long as told by your health care provider. This information is not intended to replace advice given to you by your health care provider. Make sure you discuss  any questions you have with your health care provider.   

## 2020-02-15 ENCOUNTER — Encounter (HOSPITAL_COMMUNITY): Payer: Self-pay | Admitting: Internal Medicine

## 2020-02-15 MED FILL — Lidocaine HCl Local Inj 1%: INTRAMUSCULAR | Qty: 30 | Status: AC

## 2020-02-15 MED FILL — Lidocaine HCl Local Inj 1%: INTRAMUSCULAR | Qty: 20 | Status: AC

## 2020-02-16 NOTE — Patient Instructions (Signed)
no

## 2020-02-16 NOTE — Progress Notes (Signed)
Remote ICD transmission.   

## 2020-02-21 ENCOUNTER — Telehealth: Payer: Self-pay

## 2020-02-21 NOTE — Telephone Encounter (Signed)
I spoke with the patient and he states that his monitor is blinking yellow. I let him know he got a new gen change 02-14-2020. The old monitor is not communicating with his new ICD. I let him know that [employer] monitors are on back order and the rep has a list of patient that he know needs a monitor. I told him when the rep get the monitor he will give it to the patient.

## 2020-02-29 ENCOUNTER — Other Ambulatory Visit: Payer: Self-pay

## 2020-02-29 ENCOUNTER — Ambulatory Visit (INDEPENDENT_AMBULATORY_CARE_PROVIDER_SITE_OTHER): Payer: Medicare Other | Admitting: Emergency Medicine

## 2020-02-29 DIAGNOSIS — I255 Ischemic cardiomyopathy: Secondary | ICD-10-CM

## 2020-02-29 LAB — CUP PACEART INCLINIC DEVICE CHECK
Brady Statistic RA Percent Paced: 0 %
Brady Statistic RV Percent Paced: 98 %
Date Time Interrogation Session: 20220215115329
HighPow Impedance: 44 Ohm
Implantable Lead Implant Date: 20020527
Implantable Lead Implant Date: 20020527
Implantable Lead Implant Date: 20131212
Implantable Lead Location: 753858
Implantable Lead Location: 753859
Implantable Lead Location: 753860
Implantable Lead Model: 148
Implantable Lead Model: 4194
Implantable Lead Model: 6940
Implantable Lead Serial Number: 120244
Implantable Pulse Generator Implant Date: 20220131
Lead Channel Impedance Value: 562 Ohm
Lead Channel Impedance Value: 624 Ohm
Lead Channel Impedance Value: 781 Ohm
Lead Channel Pacing Threshold Amplitude: 1.1 V
Lead Channel Pacing Threshold Amplitude: 1.2 V
Lead Channel Pacing Threshold Pulse Width: 0.4 ms
Lead Channel Pacing Threshold Pulse Width: 1 ms
Lead Channel Sensing Intrinsic Amplitude: 3.8 mV
Lead Channel Setting Pacing Amplitude: 2 V
Lead Channel Setting Pacing Amplitude: 2.5 V
Lead Channel Setting Pacing Amplitude: 2.5 V
Lead Channel Setting Pacing Pulse Width: 0.4 ms
Lead Channel Setting Pacing Pulse Width: 1 ms
Lead Channel Setting Sensing Sensitivity: 0.6 mV
Lead Channel Setting Sensing Sensitivity: 1 mV
Pulse Gen Serial Number: 149562

## 2020-02-29 NOTE — Progress Notes (Signed)
Wound check appointment. Dermabond removed. Wound without redness or edema. Incision edges approximated, wound well healed. Normal device function. Thresholds and impedances consistent with implant measurements. Device ouputs programmed at appropriate safety margin for chronic leads. Histogram distribution appropriate for patient and level of activity. Patient in Persistent AF, + OAC.  No Ventricular arrhythmias noted. Patient is biventricularly pacing 99% of the time.  Patient educated about wound care, arm mobility, lifting restrictions, shock plan. Patient is awaiting new remote monitor.  ROV with Dr. Caryl Comes 05/24/20.

## 2020-03-21 ENCOUNTER — Telehealth: Payer: Self-pay | Admitting: Cardiology

## 2020-03-21 NOTE — Telephone Encounter (Signed)
Spoke with pt, Follow up scheduled with dr Stanford Breed per patient request to discuss atrial fib and DCCV.

## 2020-03-21 NOTE — Telephone Encounter (Signed)
STAT if patient feels like he/she is going to faint   1) Are you dizzy now? occational  2) Do you feel faint or have you passed out? no  3) Do you have any other symptoms? Real tired, lethargic   4) Have you checked your HR and BP (record if available)? No   Patient states he has been very tired and lethargic. He states he has no energy. He states he has occasional lightheadedness, and that he just does not feel good.

## 2020-03-21 NOTE — Telephone Encounter (Signed)
Presenting rhythm AF w/ controlled VR. Patient has had an increased amount of AF since per trends below. See note from Dr. Caryl Comes in regards to DCCV. No episodes noted on remote. Advised I will forward to Dr. Stanford Breed for review. Advised to call back with any new or worsening symptoms.

## 2020-03-21 NOTE — Telephone Encounter (Signed)
Spoke with pt, he was at dinner on the 4 th and felt funny, a little heaviness in the chest and a little dizziness. He went home and sent a transmission. He just does not feel good, tired and having to rest frequently. He is wondering if related to the atrial fib or something with his device. Sent a message to the device clinic for them to look at his transmission from Friday 3-4.

## 2020-03-21 NOTE — Telephone Encounter (Signed)
-----  Message from Cristopher Estimable, RN sent at 03/21/2020  2:54 PM EST ----- This patient sent a remote on Friday 3/4 because he was having some symptoms, can you please look and see if there is anything on that transmission.

## 2020-03-21 NOTE — Telephone Encounter (Signed)
Spoke to patient he stated he feels weak,no energy.Stated he feels like he is in afib most of time.He only wants to see Dr.Crenshaw.Advised I will send message to Dr.Crenshaw's RN for appointment.He would like to see Dr[facility]Crenshaw in [facility].

## 2020-03-27 DIAGNOSIS — H2513 Age-related nuclear cataract, bilateral: Secondary | ICD-10-CM | POA: Diagnosis not present

## 2020-03-27 DIAGNOSIS — H524 Presbyopia: Secondary | ICD-10-CM | POA: Diagnosis not present

## 2020-03-28 DIAGNOSIS — L720 Epidermal cyst: Secondary | ICD-10-CM | POA: Diagnosis not present

## 2020-03-28 DIAGNOSIS — Z85828 Personal history of other malignant neoplasm of skin: Secondary | ICD-10-CM | POA: Diagnosis not present

## 2020-03-28 DIAGNOSIS — L812 Freckles: Secondary | ICD-10-CM | POA: Diagnosis not present

## 2020-03-28 DIAGNOSIS — D1801 Hemangioma of skin and subcutaneous tissue: Secondary | ICD-10-CM | POA: Diagnosis not present

## 2020-03-28 DIAGNOSIS — L905 Scar conditions and fibrosis of skin: Secondary | ICD-10-CM | POA: Diagnosis not present

## 2020-03-28 DIAGNOSIS — L821 Other seborrheic keratosis: Secondary | ICD-10-CM | POA: Diagnosis not present

## 2020-03-28 DIAGNOSIS — L570 Actinic keratosis: Secondary | ICD-10-CM | POA: Diagnosis not present

## 2020-03-29 ENCOUNTER — Other Ambulatory Visit: Payer: Self-pay

## 2020-03-29 ENCOUNTER — Encounter: Payer: Self-pay | Admitting: Cardiology

## 2020-03-29 ENCOUNTER — Encounter: Payer: Self-pay | Admitting: *Deleted

## 2020-03-29 ENCOUNTER — Ambulatory Visit (INDEPENDENT_AMBULATORY_CARE_PROVIDER_SITE_OTHER): Payer: Medicare Other | Admitting: Cardiology

## 2020-03-29 ENCOUNTER — Ambulatory Visit: Payer: Medicare Other | Admitting: Cardiology

## 2020-03-29 VITALS — BP 118/70 | HR 70 | Ht 70.0 in | Wt 212.0 lb

## 2020-03-29 DIAGNOSIS — I2510 Atherosclerotic heart disease of native coronary artery without angina pectoris: Secondary | ICD-10-CM | POA: Diagnosis not present

## 2020-03-29 DIAGNOSIS — I428 Other cardiomyopathies: Secondary | ICD-10-CM

## 2020-03-29 DIAGNOSIS — I472 Ventricular tachycardia: Secondary | ICD-10-CM

## 2020-03-29 DIAGNOSIS — R072 Precordial pain: Secondary | ICD-10-CM

## 2020-03-29 DIAGNOSIS — I4729 Other ventricular tachycardia: Secondary | ICD-10-CM

## 2020-03-29 DIAGNOSIS — I255 Ischemic cardiomyopathy: Secondary | ICD-10-CM

## 2020-03-29 NOTE — Progress Notes (Signed)
HPI: FU history of coronary artery disease, mixed ischemic and nonischemic cardiomyopathy, history of ICD, paroxysmal atrial fibrillation, and ventricular tachycardia. Previous abdominal ultrasound in April 2006 showed no aneurysm. Last cardiac catheterization in October 2008 showed an occluded right coronary artery with left-to-right collateralization. Ejection fraction is 20%. There was no other coronary disease noted. Last Myoview in Dec 2011 showed prior inferior infarct but no ischemia. His ejection fraction was 19%. ABIs in December of 2011 were normal. Previous carotid Dopplers in Sept 2014 showed 0-39% stenosis bilaterally.The patient has had upgrade of his device to CRT-D. Patient had revision of his left ventricular lead in December 2013. Amiodarone discontinued March 2019 because of potential pulmonary toxicity. High resolution chest CT February 2020 showed mild pulmonary fibrosis, mild emphysema. Echocardiogram repeated June 2021 and showed ejection fraction 30 to 35%, moderate left ventricular enlargement, grade 1 diastolic dysfunction, moderate left atrial enlargement, mild mitral regurgitation.  Since last seen,patient states that approximately 2 weeks ago he sat down to eat.  He states "I felt like I may begin to have chest pain but never did".  He then felt weak for 4 minutes.  He then had intermittent weakness for approximately 2 days.  He never had chest pain, palpitations, dyspnea, nausea or vomiting though he did lose his appetite.  Since that time he denies any dyspnea, chest pain, palpitations or syncope.  Current Outpatient Medications  Medication Sig Dispense Refill  . antiseptic oral rinse (BIOTENE) LIQD 15 mLs by Mouth Rinse route in the morning and at bedtime.    . budesonide-formoterol (SYMBICORT) 160-4.5 MCG/ACT inhaler Inhale 2 puffs into the lungs 2 (two) times daily. 1 Inhaler 12  . carvedilol (COREG) 6.25 MG tablet Take 1 tablet (6.25 mg total) by mouth 2 (two)  times daily with a meal. 180 tablet 3  . cholecalciferol (VITAMIN D) 1000 units tablet Take 1,000 Units by mouth every evening.    [name] ENTRESTO 24-26 MG TAKE 1 TABLET BY MOUTH TWICE DAILY (Patient taking differently: Take 1 tablet by mouth 2 (two) times daily.) 180 tablet 3  . eplerenone (INSPRA) 25 MG tablet Take 1 tablet (25 mg total) by mouth daily. (Patient taking differently: Take 25 mg by mouth daily at 12 noon.) 30 tablet 11  . fluticasone (FLONASE) 50 MCG/ACT nasal spray SHAKE LIQUID AND USE 2 SPRAYS IN EACH NOSTRIL TWICE DAILY (Patient taking differently: Place 2 sprays into both nostrils in the morning and at bedtime.) 32 g 12  . furosemide (LASIX) 40 MG tablet TAKE 2 TABLETS BY MOUTH EVERY DAY (Patient taking differently: Take 80 mg by mouth daily.) 90 tablet 3  . ipratropium (ATROVENT) 0.06 % nasal spray USE 2 SPRAYS IN EACH NOSTRIL THREE TIMES DAILY (Patient taking differently: Place 2 sprays into both nostrils in the morning and at bedtime. USE 2 SPRAYS IN EACH NOSTRIL THREE TIMES DAILY) 15 mL 12  . loratadine (CLARITIN) 10 MG tablet Take 10 mg by mouth daily.    . Magnesium 250 MG TABS Take 250 mg by mouth every evening.    . mexiletine (MEXITIL) 150 MG capsule TAKE 2 CAPSULES(300 MG) BY MOUTH TWICE DAILY (Patient taking differently: Take 300 mg by mouth 2 (two) times daily.) 360 capsule 2  . Multiple Vitamin (MULTIVITAMIN WITH MINERALS) TABS Take 1 tablet by mouth daily.    . nitroGLYCERIN (NITROSTAT) 0.4 MG SL tablet DISSOLVE 1 TABLET UNDER THE TONGUE EVERY 5 MINUTES AS NEEDED FOR CHEST PAIN (Patient taking differently: Place  0.4 mg under the tongue every 5 (five) minutes x 3 doses as needed for chest pain.) 25 tablet 5  . polyvinyl alcohol-povidone (HYPOTEARS) 1.4-0.6 % ophthalmic solution Place 1-2 drops into both eyes as needed (dry eyes).     . potassium chloride SA (KLOR-CON) 20 MEQ tablet TAKE 1 TABLET(20 MEQ) BY MOUTH DAILY (Patient taking differently: Take 20 mEq by mouth daily.)  90 tablet 3  . Probiotic Product (PROBIOTIC DAILY PO) Take 1 capsule by mouth every evening.    . rivaroxaban (XARELTO) 20 MG TABS tablet Take 1 tablet (20 mg total) by mouth daily with supper. 90 tablet 3  . rosuvastatin (CRESTOR) 40 MG tablet Take 1 tablet (40 mg total) by mouth daily. (Patient taking differently: Take 40 mg by mouth every evening.) 90 tablet 3  . triamcinolone (KENALOG) 0.1 % Apply 1 application topically 2 (two) times daily as needed (skin irritation).    . VENTOLIN HFA 108 (90 Base) MCG/ACT inhaler INHALE 1 TO 2 PUFFS INTO THE LUNGS EVERY 4 HOURS AS NEEDED FOR WHEEZING OR SHORTNESS OF BREATH (Patient taking differently: Inhale 1-2 puffs into the lungs every 4 (four) hours as needed for wheezing or shortness of breath.) 18 g 5  . Wheat Dextrin (BENEFIBER PO) Take 2 Scoops by mouth 2 (two) times daily.     No current facility-administered medications for this visit.     Past Medical History:  Diagnosis Date  . Adenomatous colon polyp 2009; 06/2012   Colonoscopy 07/2007---Dr. Barron Schmid GI--Repeat 06/2012 showed one tubular adenoma w/out high grade dysplasia.  [name] AICD (automatic cardioverter/defibrillator) present    [employer]  . Atrial fibrillation ([place])   . Bilateral sensorineural hearing loss   . BPH (benign prostatic hypertrophy)    PSAs ok (followed by urologist): no LUTS  . CAD (coronary artery disease)    PTCA RCA 1997 (Inf/post MI);  . CHF (congestive heart failure) ([place])   . Chronic constipation   . Chronic rhinitis    ?vasomotor (Dr. Erik Obey)  . COPD (chronic obstructive pulmonary disease) ([place])   . Diverticulosis of colon 2005   Noted on Colonoscopies in 2005 and 2009 and on CT 2010  . Gallstones 06/2014   No evidence of cholecystitis on abd u/s 06/2014  . GERD (gastroesophageal reflux disease)   . Hepatic steatosis 2010; 2016   Noted on CT abd 2010 and on u/s abd 04/2009 (transaminasemia) and u/s 06/2014.  [name] Hyperlipidemia   . Hypertension   .  Ischemic cardiomyopathy   . Microscopic hematuria    Cysto neg 02/2009 except for BPH-likely has microhem from his renal stone dz  . Nephrolithiasis    No distal stones on CT 2010; stable 8 mm RLP stone, 36m LMP stone 10/2013 plain film.  Abd u/s 06/2014: 9 mm nonobstructing lower pole right renal stone.  03/2015 urol: stable nonobstructing renal calc by CT 12/2014 and KUB 03/2015.  .Marland KitchenObstructive sleep apnea    no cpap uses since weight loss of 50 pounds  . Presence of permanent cardiac pacemaker    [employer] . Squamous cell carcinoma of back   . Ventricular tachycardia ([facility]     Past Surgical History:  Procedure Laterality Date  . ATRIAL ABLATION SURGERY     flutter ablation 2002  . BREAST SURGERY Left 1990   "Tissue removed from breast"  . CARDIAC CATHETERIZATION    . carotid doppler  09/25/12   No signif stenosis  . COLONOSCOPY N/A 07/13/2012  Diverticula ascending colon.  Polyp x 1-recall 5 yrs (Dr. Watt Climes) Procedure: COLONOSCOPY;  Surgeon: Jeryl Columbia, MD;  Location: [facility] ENDOSCOPY;  Service: Endoscopy;  Laterality: N/A; polypectomy x 1   . COLONOSCOPY WITH PROPOFOL N/A 08/18/2015   Procedure: COLONOSCOPY WITH PROPOFOL;  Surgeon: Mauri Pole, MD;  Location: [facility] ENDOSCOPY;  Service: Endoscopy;  Laterality: N/A;  . ESOPHAGOGASTRODUODENOSCOPY N/A 07/13/2012   Procedure: ESOPHAGOGASTRODUODENOSCOPY (EGD);  Surgeon: Jeryl Columbia, MD;  Location: [facility];  Service: Endoscopy;  Laterality: N/A;  NORMAL  . EXTRACORPOREAL SHOCK WAVE LITHOTRIPSY    . HEMORRHOID BANDING    . ICD     a) Guidant Contak H170- Virl Axe, MD 07/25/06 (second device) b) Medtronic CRT-D  . ICD GENERATOR CHANGEOUT N/A 02/14/2020   Procedure: ICD GENERATOR CHANGEOUT;  Surgeon: Deboraha Sprang, MD;  Location: [facility] CV LAB;  Service: Cardiovascular;  Laterality: N/A;  . INGUINAL HERNIA REPAIR Bilateral 1975  . LEAD REVISION Bilateral 12/26/2011   Procedure: LEAD REVISION;  Surgeon: Evans Lance,  MD;  Location: [facility] CATH LAB;  Service: Cardiovascular;  Laterality: Bilateral;  . PFTs  02/2015   Mod restriction (interstitial), mod diffusion defect (Dr. Annamaria Boots)  . precancer area keratsis removed  05/2015    leftf [place] and left arm and right arm and back    Social History   Socioeconomic History  . Marital status: Married    Spouse name: Not on file  . Number of children: 3  . Years of education: Not on file  . Highest education level: Not on file  Occupational History  . Occupation: Retired    Fish farm manager: RETIRED  Tobacco Use  . Smoking status: Former Smoker    Packs/day: 2.00    Years: 15.00    Pack years: 30.00    Types: Cigarettes    Quit date: 01/15/1980    Years since quitting: 40.2  . Smokeless tobacco: Never Used  Vaping Use  . Vaping Use: Never used  Substance and Sexual Activity  . Alcohol use: No    Alcohol/week: 0.0 standard drinks  . Drug use: No  . Sexual activity: Not on file  Other Topics Concern  . Not on file  Social History Narrative   Married.   Hx of cigarettes: quit in the 1980s.  No alc/drugs.   Social Determinants of Health   Financial Resource Strain: Low Risk   . Difficulty of Paying Living Expenses: Not hard at all  Food Insecurity: No Food Insecurity  . Worried About Charity fundraiser in the Last Year: Never true  . Ran Out of Food in the Last Year: Never true  Transportation Needs: No Transportation Needs  . Lack of Transportation (Medical): No  . Lack of Transportation (Non-Medical): No  Physical Activity: Sufficiently Active  . Days of Exercise per Week: 7 days  . Minutes of Exercise per Session: 40 min  Stress: No Stress Concern Present  . Feeling of Stress : Not at all  Social Connections: Moderately Isolated  . Frequency of Communication with Friends and Family: More than three times a week  . Frequency of Social Gatherings with Friends and Family: Once a week  . Attends Religious Services: Never  . Active Member of Clubs  or Organizations: No  . Attends Archivist Meetings: Never  . Marital Status: Married  Human resources officer Violence: Not At Risk  . Fear of Current or Ex-Partner: No  . Emotionally Abused: No  . Physically Abused: No  .  Sexually Abused: No    Family History  Problem Relation Age of Onset  . Heart disease Mother   . Other Mother        colon surgery for fistula  . Gallstones Mother   . Breast cancer Maternal Grandmother   . Coronary artery disease Father        family hx of  . Heart attack Father        4 stents/ pacemaker  . Nephrolithiasis Father   . Hypertension Sister   . Goiter Paternal Grandmother   . Nephrolithiasis Son   . Colon cancer Neg Hx     ROS: no fevers or chills, productive cough, hemoptysis, dysphasia, odynophagia, melena, hematochezia, dysuria, hematuria, rash, seizure activity, orthopnea, PND, pedal edema, claudication. Remaining systems are negative.  Physical Exam: Well-developed well-nourished in no acute distress.  Skin is warm and dry.  HEENT is normal.  Neck is supple.  Chest is clear to auscultation with normal expansion.  Cardiovascular exam is regular rate and rhythm.  Abdominal exam nontender or distended. No masses palpated. Extremities show no edema. neuro grossly intact  ECG-ventricular paced rhythm with probable underlying atrial fibrillation.  Personally reviewed  A/P  1 coronary artery disease-Continue statin.  He is not on aspirin given need for Xarelto.  Patient had recent episode of weakness of uncertain etiology.  He stated he felt like he may have chest pain but it never occurred.  I will arrange a [place] nuclear study to screen for ischemia.  2 paroxysmal atrial fibrillation-I discussed rate control versus rhythm control today.  Not clear to me that he is having symptoms though Dr. Caryl Comes had considered cardioversion.  If indeed we attempt rhythm control he will likely need an antiarrhythmic to maintain sinus rhythm.   Amiodarone was discontinued previously due to potential pulmonary toxicity.  Not a candidate for flecainide given cardiomyopathy and coronary disease.  He is also on mexiletine with potential interaction with other antiarrhythmics.  I will ask Dr. Caryl Comes to review for whether rate control or rhythm control would be appropriate and if rhythm control is elected which antiarrhythmic may be useful (question Tikosyn).  Continue coreg and Xarelto.    3 ischemic cardiomyopathy-continue entresto and coreg.  4 chronic systolic congestive heart failure-pt remains euvolemic.  Continue Lasix and eplerenone at present dose.   5 hypertension-blood pressure controlled.  Continue present medications and follow.  6 history of ventricular tachycardia-amiodarone discontinued previously due to potential lung toxicity.  We will continue mexiletine and beta-blocker at present dose.  7 hyperlipidemia-continue statin.  8 biventricular ICD-s/p recent generator change; per EP.  Kirk Ruths, MD

## 2020-03-29 NOTE — Patient Instructions (Signed)
  Testing/Procedures:  Your physician has requested that you have a lexiscan myoview. For further information please visit [email or web address]. Please follow instruction sheet, as given.[address] OFF[facility]     Follow-Up: At [facility], you and your health needs are our priority.  As part of our continuing mission to provide you with exceptional heart care, we have created designated Provider Care Teams.  These Care Teams include your primary Cardiologist (physician) and Advanced Practice Providers (APPs -  Physician Assistants and Nurse Practitioners) who all work together to provide you with the care you need, when you need it.  We recommend signing up for the patient portal called "MyChart".  Sign up information is provided on this After Visit Summary.  MyChart is used to connect with patients for Virtual Visits (Telemedicine).  Patients are able to view lab/test results, encounter notes, upcoming appointments, etc.  Non-urgent messages can be sent to your provider as well.   To learn more about what you can do with MyChart, go to [email or web address].    Your next appointment:    AS SCHEDULED

## 2020-03-31 ENCOUNTER — Ambulatory Visit: Payer: Medicare Other

## 2020-03-31 NOTE — Progress Notes (Signed)
  Chronic Care Management   Outreach Note   Name: [name] MRN: [id number] DOB: Apr 07, 1940  Referred by: [name], MD Reason for referral: Telephone Appointment with [facility] Pharmacist, [name].   An unsuccessful telephone outreach was attempted today. The patient was referred to the pharmacist for assistance with care management and care coordination.    Telephone appointment with clinical pharmacist today (03/31/2020) at 130pm- pt currently not at home requested call back to reschedule.  Reviewed recent OVs in advance of call: 03/29/2020 (Dr Crenshaw):paroxysmal atrial fibrillation-I discussed rate control versus rhythm control today.  Not clear to me that he is having symptoms though Dr. Caryl Comes had considered cardioversion.  If indeed we attempt rhythm control he will likely need an antiarrhythmic to maintain sinus rhythm.  Amiodarone was discontinued previously due to potential pulmonary toxicity.  Not a candidate for flecainide given cardiomyopathy and coronary disease.  He is also on mexiletine with potential interaction with other antiarrhythmics.  I will ask Dr. Caryl Comes to review for whether rate control or rhythm control would be appropriate and if rhythm control is elected which antiarrhythmic may be useful (question Tikosyn).  Continue coreg and Xarelto.  02/14/2019 (Discharge): ICD generator changeout.   [name], Pharm.D., BCGP Clinical Pharmacist [facility] [phone]

## 2020-04-06 DIAGNOSIS — H21561 Pupillary abnormality, right eye: Secondary | ICD-10-CM | POA: Diagnosis not present

## 2020-04-06 DIAGNOSIS — H2512 Age-related nuclear cataract, left eye: Secondary | ICD-10-CM | POA: Diagnosis not present

## 2020-04-06 DIAGNOSIS — H25812 Combined forms of age-related cataract, left eye: Secondary | ICD-10-CM | POA: Diagnosis not present

## 2020-04-06 DIAGNOSIS — H2181 Floppy iris syndrome: Secondary | ICD-10-CM | POA: Diagnosis not present

## 2020-04-13 ENCOUNTER — Telehealth: Payer: Self-pay

## 2020-04-13 ENCOUNTER — Telehealth (INDEPENDENT_AMBULATORY_CARE_PROVIDER_SITE_OTHER): Payer: Medicare Other | Admitting: Internal Medicine

## 2020-04-13 ENCOUNTER — Other Ambulatory Visit: Payer: Self-pay

## 2020-04-13 VITALS — BP 170/70 | HR 74 | Temp 97.1°F | Ht 70.0 in | Wt 206.0 lb

## 2020-04-13 DIAGNOSIS — I255 Ischemic cardiomyopathy: Secondary | ICD-10-CM | POA: Diagnosis not present

## 2020-04-13 DIAGNOSIS — I480 Paroxysmal atrial fibrillation: Secondary | ICD-10-CM

## 2020-04-13 DIAGNOSIS — I472 Ventricular tachycardia: Secondary | ICD-10-CM | POA: Diagnosis not present

## 2020-04-13 DIAGNOSIS — Z95810 Presence of automatic (implantable) cardiac defibrillator: Secondary | ICD-10-CM

## 2020-04-13 DIAGNOSIS — Z01812 Encounter for preprocedural laboratory examination: Secondary | ICD-10-CM

## 2020-04-13 DIAGNOSIS — I4729 Other ventricular tachycardia: Secondary | ICD-10-CM

## 2020-04-13 NOTE — Telephone Encounter (Signed)
  Patient Consent for Virtual Visit         [name] has provided verbal consent on 04/13/2020 for a virtual visit (video or telephone).   CONSENT FOR VIRTUAL VISIT FOR:  [name]  By participating in this virtual visit I agree to the following:  I hereby voluntarily request, consent and authorize [facility] and its employed or contracted physicians, physician assistants, nurse practitioners or other licensed health care professionals (the Practitioner), to provide me with telemedicine health care services (the "Services") as deemed necessary by the treating Practitioner. I acknowledge and consent to receive the Services by the Practitioner via telemedicine. I understand that the telemedicine visit will involve communicating with the Practitioner through live audiovisual communication technology and the disclosure of certain medical information by electronic transmission. I acknowledge that I have been given the opportunity to request an in-person assessment or other available alternative prior to the telemedicine visit and am voluntarily participating in the telemedicine visit.  I understand that I have the right to withhold or withdraw my consent to the use of telemedicine in the course of my care at any time, without affecting my right to future care or treatment, and that the Practitioner or I may terminate the telemedicine visit at any time. I understand that I have the right to inspect all information obtained and/or recorded in the course of the telemedicine visit and may receive copies of available information for a reasonable fee.  I understand that some of the potential risks of receiving the Services via telemedicine include:  [name] Delay or interruption in medical evaluation due to technological equipment failure or disruption; . Information transmitted may not be sufficient (e.g. poor resolution of images) to allow for appropriate medical decision making by the Practitioner;  and/or  . In rare instances, security protocols could fail, causing a breach of personal health information.  Furthermore, I acknowledge that it is my responsibility to provide information about my medical history, conditions and care that is complete and accurate to the best of my ability. I acknowledge that Practitioner's advice, recommendations, and/or decision may be based on factors not within their control, such as incomplete or inaccurate data provided by me or distortions of diagnostic images or specimens that may result from electronic transmissions. I understand that the practice of medicine is not an exact science and that Practitioner makes no warranties or guarantees regarding treatment outcomes. I acknowledge that a copy of this consent can be made available to me via my patient portal ([facility]), or I can request a printed copy by calling the office of [facility].    I understand that my insurance will be billed for this visit.   I have read or had this consent read to me. . I understand the contents of this consent, which adequately explains the benefits and risks of the Services being provided via telemedicine.  . I have been provided ample opportunity to ask questions regarding this consent and the Services and have had my questions answered to my satisfaction. . I give my informed consent for the services to be provided through the use of telemedicine in my medical care

## 2020-04-13 NOTE — H&P (View-Only) (Signed)
Electrophysiology TeleHealth Note   Due to national recommendations of social distancing due to COVID 19, an audio/video telehealth visit is felt to be most appropriate for this patient at this time.  See MyChart message from today for the patient's consent to telehealth for [facility].   Date:  04/14/2020   ID:  [name], DOB 08-15-1940, MRN [id number]  Location: patient's home  Provider location: [address], [place]  Evaluation Performed: Follow-up visit  PCP:  [name], MD  Cardiologist:   [facility] Electrophysiologist:  SK   Chief Complaint: atiral fb  History of Present Illness:    [name] is a 80 y.o. male who presents via audio/video conferencing for a telehealth visit today.  Since last being seen in our clinic for ventricular tachycardia in the setting of an ischemic/nonischemic cardiomyopathy for which he previously took amiodarone complicated by concern for lung toxicity is subsequently discontinued and now managed with mexiletine the patient reports some decrease in exercise tolerance over the last 1-2 months as measured by how long he can putter in the yard without stopping    More recently has developed atrial fibrillation-persistent of which he is unaware; primary cardiologist had elected not to cardiovert; however, he is seen today to reconsider the role of rhythm control       DATE TEST EF   10/08 Cath 20 % T RCA  12/13 Cath  <20% T RCA   1/17 Echo 20-25 %   6/21 Echo  30-35%            Date Cr K Hgb LDL  2008      11/17       6/18 0.95 4.0 15.2 162  5/19 0.95 4.0    10/20 0.87 4.1 14.2   1/22 1.00 4.1 92.7     Thromboembolic risk factors ( age -28, HTN-1, Vasc disease -1, CHF-1) for a CHADSVASc Score of>= 5        The patient denies symptoms of fevers, chills, cough, or new SOB worrisome for COVID 19.    Past Medical History:  Diagnosis Date  . Adenomatous colon polyp  2009; 06/2012   Colonoscopy 07/2007---Dr. Barron Schmid GI--Repeat 06/2012 showed one tubular adenoma w/out high grade dysplasia.  [name] AICD (automatic cardioverter/defibrillator) present    [employer]  . Atrial fibrillation ([place])   . Bilateral sensorineural hearing loss   . BPH (benign prostatic hypertrophy)    PSAs ok (followed by urologist): no LUTS  . CAD (coronary artery disease)    PTCA RCA 1997 (Inf/post MI);  . CHF (congestive heart failure) ([place])   . Chronic constipation   . Chronic rhinitis    ?vasomotor (Dr. Erik Obey)  . COPD (chronic obstructive pulmonary disease) ([place])   . Diverticulosis of colon 2005   Noted on Colonoscopies in 2005 and 2009 and on CT 2010  . Gallstones 06/2014   No evidence of cholecystitis on abd u/s 06/2014  . GERD (gastroesophageal reflux disease)   . Hepatic steatosis 2010; 2016   Noted on CT abd 2010 and on u/s abd 04/2009 (transaminasemia) and u/s 06/2014.  [name] Hyperlipidemia   . Hypertension   . Ischemic cardiomyopathy   . Microscopic hematuria    Cysto neg 02/2009 except for BPH-likely has microhem from his renal stone dz  . Nephrolithiasis    No distal stones on CT 2010; stable 8 mm RLP stone, 40m LMP stone 10/2013 plain film.  Abd u/s 06/2014:  9 mm nonobstructing lower pole right renal stone.  03/2015 urol: stable nonobstructing renal calc by CT 12/2014 and KUB 03/2015.  [name] Obstructive sleep apnea    no cpap uses since weight loss of 50 pounds  . Presence of permanent cardiac pacemaker    [employer]  . Squamous cell carcinoma of back   . Ventricular tachycardia [facility])     Past Surgical History:  Procedure Laterality Date  . ATRIAL ABLATION SURGERY     flutter ablation 2002  . BREAST SURGERY Left 1990   "Tissue removed from breast"  . CARDIAC CATHETERIZATION    . carotid doppler  09/25/12   No signif stenosis  . COLONOSCOPY N/A 07/13/2012   Diverticula ascending colon.  Polyp x 1-recall 5 yrs (Dr. Watt Climes) Procedure: COLONOSCOPY;  Surgeon:  Jeryl Columbia, MD;  Location: [facility] ENDOSCOPY;  Service: Endoscopy;  Laterality: N/A; polypectomy x 1   . COLONOSCOPY WITH PROPOFOL N/A 08/18/2015   Procedure: COLONOSCOPY WITH PROPOFOL;  Surgeon: Mauri Pole, MD;  Location: [facility] ENDOSCOPY;  Service: Endoscopy;  Laterality: N/A;  . ESOPHAGOGASTRODUODENOSCOPY N/A 07/13/2012   Procedure: ESOPHAGOGASTRODUODENOSCOPY (EGD);  Surgeon: Jeryl Columbia, MD;  Location: [facility];  Service: Endoscopy;  Laterality: N/A;  NORMAL  . EXTRACORPOREAL SHOCK WAVE LITHOTRIPSY    . HEMORRHOID BANDING    . ICD     a) Guidant Contak H170- Virl Axe, MD 07/25/06 (second device) b) Medtronic CRT-D  . ICD GENERATOR CHANGEOUT N/A 02/14/2020   Procedure: ICD GENERATOR CHANGEOUT;  Surgeon: Deboraha Sprang, MD;  Location: [place] [facility] CV LAB;  Service: Cardiovascular;  Laterality: N/A;  . INGUINAL HERNIA REPAIR Bilateral 1975  . LEAD REVISION Bilateral 12/26/2011   Procedure: LEAD REVISION;  Surgeon: Evans Lance, MD;  Location: [facility] CATH LAB;  Service: Cardiovascular;  Laterality: Bilateral;  . PFTs  02/2015   Mod restriction (interstitial), mod diffusion defect (Dr. Annamaria Boots)  . precancer area keratsis removed  05/2015    leftf orehead and left arm and right arm and back    Current Outpatient Medications  Medication Sig Dispense Refill  . budesonide-formoterol (SYMBICORT) 160-4.5 MCG/ACT inhaler Inhale 2 puffs into the lungs 2 (two) times daily. 1 Inhaler 12  . carvedilol (COREG) 6.25 MG tablet Take 1 tablet (6.25 mg total) by mouth 2 (two) times daily with a meal. 180 tablet 3  . cholecalciferol (VITAMIN D) 1000 units tablet Take 1,000 Units by mouth every evening.    [name] ENTRESTO 24-26 MG TAKE 1 TABLET BY MOUTH TWICE DAILY (Patient taking differently: Take 1 tablet by mouth 2 (two) times daily.) 180 tablet 3  . eplerenone (INSPRA) 25 MG tablet Take 1 tablet (25 mg total) by mouth daily. (Patient taking differently: Take 25 mg by mouth daily at 12 noon.) 30 tablet 11  .  fluticasone (FLONASE) 50 MCG/ACT nasal spray SHAKE LIQUID AND USE 2 SPRAYS IN EACH NOSTRIL TWICE DAILY (Patient taking differently: Place 2 sprays into both nostrils in the morning and at bedtime.) 32 g 12  . furosemide (LASIX) 40 MG tablet TAKE 2 TABLETS BY MOUTH EVERY DAY (Patient taking differently: Take 80 mg by mouth daily.) 90 tablet 3  . ipratropium (ATROVENT) 0.06 % nasal spray USE 2 SPRAYS IN EACH NOSTRIL THREE TIMES DAILY (Patient taking differently: Place 2 sprays into both nostrils in the morning and at bedtime. USE 2 SPRAYS IN EACH NOSTRIL THREE TIMES DAILY) 15 mL 12  . loratadine (CLARITIN) 10 MG tablet Take 10 mg by mouth  daily.    . Magnesium 250 MG TABS Take 250 mg by mouth every evening.    . mexiletine (MEXITIL) 150 MG capsule TAKE 2 CAPSULES(300 MG) BY MOUTH TWICE DAILY (Patient taking differently: Take 300 mg by mouth 2 (two) times daily.) 360 capsule 2  . Multiple Vitamin (MULTIVITAMIN WITH MINERALS) TABS Take 1 tablet by mouth daily.    . nitroGLYCERIN (NITROSTAT) 0.4 MG SL tablet DISSOLVE 1 TABLET UNDER THE TONGUE EVERY 5 MINUTES AS NEEDED FOR CHEST PAIN (Patient taking differently: Place 0.4 mg under the tongue every 5 (five) minutes x 3 doses as needed for chest pain.) 25 tablet 5  . polyvinyl alcohol-povidone (HYPOTEARS) 1.4-0.6 % ophthalmic solution Place 1-2 drops into both eyes as needed (dry eyes).     . potassium chloride SA (KLOR-CON) 20 MEQ tablet TAKE 1 TABLET(20 MEQ) BY MOUTH DAILY (Patient taking differently: Take 20 mEq by mouth daily.) 90 tablet 3  . Probiotic Product (PROBIOTIC DAILY PO) Take 1 capsule by mouth every evening.    . rivaroxaban (XARELTO) 20 MG TABS tablet Take 1 tablet (20 mg total) by mouth daily with supper. 90 tablet 3  . rosuvastatin (CRESTOR) 40 MG tablet Take 1 tablet (40 mg total) by mouth daily. (Patient taking differently: Take 40 mg by mouth every evening.) 90 tablet 3  . Wheat Dextrin (BENEFIBER PO) Take 2 Scoops by mouth 2 (two) times  daily.    [name] antiseptic oral rinse (BIOTENE) LIQD 15 mLs by Mouth Rinse route in the morning and at bedtime.    . triamcinolone (KENALOG) 0.1 % Apply 1 application topically 2 (two) times daily as needed (skin irritation). (Patient not taking: Reported on 04/13/2020)    . VENTOLIN HFA 108 (90 Base) MCG/ACT inhaler INHALE 1 TO 2 PUFFS INTO THE LUNGS EVERY 4 HOURS AS NEEDED FOR WHEEZING OR SHORTNESS OF BREATH (Patient not taking: Reported on 04/13/2020) 18 g 5   No current facility-administered medications for this visit.    Allergies:   Sulfa antibiotics   Social History:  The patient  reports that he quit smoking about 40 years ago. His smoking use included cigarettes. He has a 30.00 pack-year smoking history. He has never used smokeless tobacco. He reports that he does not drink alcohol and does not use drugs.   Family History:  The patient's   family history includes Breast cancer in his maternal grandmother; Coronary artery disease in his father; Gallstones in his mother; Goiter in his paternal grandmother; Heart attack in his father; Heart disease in his mother; Hypertension in his sister; Nephrolithiasis in his father and son; Other in his mother.   ROS:  Please see the history of present illness.   All other systems are personally reviewed and negative.    Exam:    Vital Signs:  BP (!) 170/70   Pulse 74   Temp (!) 97.1 F (36.2 C)   Ht _0  (1.778 m)   Wt 206 lb (93.4 kg)   BMI 29.56 kg/m     Labs/Other Tests and Data Reviewed:    Recent Labs: 11/17/2019: ALT 24; TSH 1.96 01/18/2020: BUN 13; Creatinine, Ser 1.00; Hemoglobin 15.5; Platelets 309; Potassium 4.1; Sodium 139   Wt Readings from Last 3 Encounters:  04/13/20 206 lb (93.4 kg)  03/29/20 212 lb (96.2 kg)  02/14/20 206 lb (93.4 kg)     Other studies personally reviewed: Additional studies/ records that were reviewed today include: notes from gen cards  Last device remote is  reviewed from [place] PDF dated 2/22 which  reveals normal device function,   arrhythmias - none     ASSESSMENT & PLAN:   Ventricular tachycardia  Ischemic and nonischemic cardiomyopathy  Congestive heart failure-chronic-systolic  CRT-D-[employer]'s   The patient's device was interrogated.  The information was reviewed. No changes were made in the programming.     COPD pulm fibrosis  Atrial fibrillation  Gait instability   Persistent afib  But scant over the last few years.  With decreased exercise tolerance, and esp with long hx of having maintianed sinus, would DCCV and would not add Anti-arrhythmic drugs for afib at this time.  If his recurrent afib are relatively infrequent, ie less than every 4-6 months, and if we can demonstrate improved functional status following restoring sinus,, I would not use drugs and DCCV as needed for recurrent afib If the frequency is greater than that, and symptoms diminish with sinus, then as outlined by BCr would need to consider dofetilide or catheter ablation for the maintaining of sinus If there is no improvement in function following sinus, I would declare his afib permanent, although there are [place] data( which do not apply to him given his LA enlargement, ) that suggest ablation has hard endpoint benefit even in the absence osf symptom improvement-- would want to reassess LA size prior to considering such a thing BP elevated  Will need to recheck as this is quite anomalous ( most recorded BPs about 125 )  COVID 19 screen The patient denies symptoms of COVID 19 at this time.  The importance of social distancing was discussed today.  Follow-up:  2-3 months afib clinic, BCr, or EP app Next remote: As Scheduled   Current medicines are reviewed at length with the patient today.   The patient does not have concerns regarding his medicines.  The following changes were made today:  none  Labs/ tests ordered today include: DCCV No orders of the defined types were placed in this  encounter.   Future tests ( post COVID )     Patient Risk:  after full review of this patients clinical status, I feel that they are at moderate risk at this time.  Today, I have spent 17 minutes with the patient with telehealth technology discussing the above.  Signed, Virl Axe, MD  04/14/2020 10:26 AM     [facility] [address] [place] [id number] [phone] (office) [phone] (fax)

## 2020-04-13 NOTE — Progress Notes (Signed)
Electrophysiology TeleHealth Note   Due to national recommendations of social distancing due to COVID 19, an audio/video telehealth visit is felt to be most appropriate for this patient at this time.  See MyChart message from today for the patient's consent to telehealth for [facility].   Date:  04/14/2020   ID:  [name], DOB 08-15-1940, MRN [id number]  Location: patient's home  Provider location: [address], [place]  Evaluation Performed: Follow-up visit  PCP:  [name], MD  Cardiologist:   [facility] Electrophysiologist:  SK   Chief Complaint: atiral fb  History of Present Illness:    [name] is a 80 y.o. male who presents via audio/video conferencing for a telehealth visit today.  Since last being seen in our clinic for ventricular tachycardia in the setting of an ischemic/nonischemic cardiomyopathy for which he previously took amiodarone complicated by concern for lung toxicity is subsequently discontinued and now managed with mexiletine the patient reports some decrease in exercise tolerance over the last 1-2 months as measured by how long he can putter in the yard without stopping    More recently has developed atrial fibrillation-persistent of which he is unaware; primary cardiologist had elected not to cardiovert; however, he is seen today to reconsider the role of rhythm control       DATE TEST EF   10/08 Cath 20 % T RCA  12/13 Cath  <20% T RCA   1/17 Echo 20-25 %   6/21 Echo  30-35%            Date Cr K Hgb LDL  2008      11/17       6/18 0.95 4.0 15.2 162  5/19 0.95 4.0    10/20 0.87 4.1 14.2   1/22 1.00 4.1 92.7     Thromboembolic risk factors ( age -28, HTN-1, Vasc disease -1, CHF-1) for a CHADSVASc Score of>= 5        The patient denies symptoms of fevers, chills, cough, or new SOB worrisome for COVID 19.    Past Medical History:  Diagnosis Date  . Adenomatous colon polyp  2009; 06/2012   Colonoscopy 07/2007---Dr. Barron Schmid GI--Repeat 06/2012 showed one tubular adenoma w/out high grade dysplasia.  [name] AICD (automatic cardioverter/defibrillator) present    [employer]  . Atrial fibrillation ([place])   . Bilateral sensorineural hearing loss   . BPH (benign prostatic hypertrophy)    PSAs ok (followed by urologist): no LUTS  . CAD (coronary artery disease)    PTCA RCA 1997 (Inf/post MI);  . CHF (congestive heart failure) ([place])   . Chronic constipation   . Chronic rhinitis    ?vasomotor (Dr. Erik Obey)  . COPD (chronic obstructive pulmonary disease) ([place])   . Diverticulosis of colon 2005   Noted on Colonoscopies in 2005 and 2009 and on CT 2010  . Gallstones 06/2014   No evidence of cholecystitis on abd u/s 06/2014  . GERD (gastroesophageal reflux disease)   . Hepatic steatosis 2010; 2016   Noted on CT abd 2010 and on u/s abd 04/2009 (transaminasemia) and u/s 06/2014.  [name] Hyperlipidemia   . Hypertension   . Ischemic cardiomyopathy   . Microscopic hematuria    Cysto neg 02/2009 except for BPH-likely has microhem from his renal stone dz  . Nephrolithiasis    No distal stones on CT 2010; stable 8 mm RLP stone, 40m LMP stone 10/2013 plain film.  Abd u/s 06/2014:  9 mm nonobstructing lower pole right renal stone.  03/2015 urol: stable nonobstructing renal calc by CT 12/2014 and KUB 03/2015.  . Obstructive sleep apnea    no cpap uses since weight loss of 50 pounds  . Presence of permanent cardiac pacemaker    [employer]  . Squamous cell carcinoma of back   . Ventricular tachycardia (H[facility])     Past Surgical History:  Procedure Laterality Date  . ATRIAL ABLATION SURGERY     flutter ablation 2002  . BREAST SURGERY Left 1990   "Tissue removed from breast"  . CARDIAC CATHETERIZATION    . carotid doppler  09/25/12   No signif stenosis  . COLONOSCOPY N/A 07/13/2012   Diverticula ascending colon.  Polyp x 1-recall 5 yrs (Dr. Magod) Procedure: COLONOSCOPY;  Surgeon:  Marc E Magod, MD;  Location: [facility];  Service: Endoscopy;  Laterality: N/A; polypectomy x 1   . COLONOSCOPY WITH PROPOFOL N/A 08/18/2015   Procedure: COLONOSCOPY WITH PROPOFOL;  Surgeon: Kavitha V Nandigam, MD;  Location: [facility] ENDOSCOPY;  Service: Endoscopy;  Laterality: N/A;  . ESOPHAGOGASTRODUODENOSCOPY N/A 07/13/2012   Procedure: ESOPHAGOGASTRODUODENOSCOPY (EGD);  Surgeon: Marc E Magod, MD;  Location: [facility] ENDOS[facility];  Service: Endoscopy;  Laterality: N/A;  NORMAL  . EXTRACORPOREAL SHOCK WAVE LITHOTRIPSY    . HEMORRHOID BANDING    . ICD     a) Guidant Contak H170- Vonte Rossin, MD 07/25/06 (second device) b) Medtronic CRT-D  . ICD GENERATOR CHANGEOUT N/A 02/14/2020   Procedure: ICD GENERATOR CHANGEOUT;  Surgeon: Lafe Clerk C, MD;  Location: [facility] INVASIVE CV LAB;  Service: Cardiovascular;  Laterality: N/A;  . INGUINAL HERNIA REPAIR Bilateral 1975  . LEAD REVISION Bilateral 12/26/2011   Procedure: LEAD REVISION;  Surgeon: Gregg W Taylor, MD;  Location: [facility] CATH LAB;  Service: Cardiovascular;  Laterality: Bilateral;  . PFTs  02/2015   Mod restriction (interstitial), mod diffusion defect (Dr. Young)  . precancer area keratsis removed  05/2015    leftf orehead and left arm and right arm and back    Current Outpatient Medications  Medication Sig Dispense Refill  . budesonide-formoterol (SYMBICORT) 160-4.5 MCG/ACT inhaler Inhale 2 puffs into the lungs 2 (two) times daily. 1 Inhaler 12  . carvedilol (COREG) 6.25 MG tablet Take 1 tablet (6.25 mg total) by mouth 2 (two) times daily with a meal. 180 tablet 3  . cholecalciferol (VITAMIN D) 1000 units tablet Take 1,000 Units by mouth every evening.    . ENTRESTO 24-26 MG TAKE 1 TABLET BY MOUTH TWICE DAILY (Patient taking differently: Take 1 tablet by mouth 2 (two) times daily.) 180 tablet 3  . eplerenone (INSPRA) 25 MG tablet Take 1 tablet (25 mg total) by mouth daily. (Patient taking differently: Take 25 mg by mouth daily at 12 noon.) 30 tablet 11  .  fluticasone (FLONASE) 50 MCG/ACT nasal spray SHAKE LIQUID AND USE 2 SPRAYS IN EACH NOSTRIL TWICE DAILY (Patient taking differently: Place 2 sprays into both nostrils in the morning and at bedtime.) 32 g 12  . furosemide (LASIX) 40 MG tablet TAKE 2 TABLETS BY MOUTH EVERY DAY (Patient taking differently: Take 80 mg by mouth daily.) 90 tablet 3  . ipratropium (ATROVENT) 0.06 % nasal spray USE 2 SPRAYS IN EACH NOSTRIL THREE TIMES DAILY (Patient taking differently: Place 2 sprays into both nostrils in the morning and at bedtime. USE 2 SPRAYS IN EACH NOSTRIL THREE TIMES DAILY) 15 mL 12  . loratadine (CLARITIN) 10 MG tablet Take 10 mg by mouth   daily.    . Magnesium 250 MG TABS Take 250 mg by mouth every evening.    . mexiletine (MEXITIL) 150 MG capsule TAKE 2 CAPSULES(300 MG) BY MOUTH TWICE DAILY (Patient taking differently: Take 300 mg by mouth 2 (two) times daily.) 360 capsule 2  . Multiple Vitamin (MULTIVITAMIN WITH MINERALS) TABS Take 1 tablet by mouth daily.    . nitroGLYCERIN (NITROSTAT) 0.4 MG SL tablet DISSOLVE 1 TABLET UNDER THE TONGUE EVERY 5 MINUTES AS NEEDED FOR CHEST PAIN (Patient taking differently: Place 0.4 mg under the tongue every 5 (five) minutes x 3 doses as needed for chest pain.) 25 tablet 5  . polyvinyl alcohol-povidone (HYPOTEARS) 1.4-0.6 % ophthalmic solution Place 1-2 drops into both eyes as needed (dry eyes).     . potassium chloride SA (KLOR-CON) 20 MEQ tablet TAKE 1 TABLET(20 MEQ) BY MOUTH DAILY (Patient taking differently: Take 20 mEq by mouth daily.) 90 tablet 3  . Probiotic Product (PROBIOTIC DAILY PO) Take 1 capsule by mouth every evening.    . rivaroxaban (XARELTO) 20 MG TABS tablet Take 1 tablet (20 mg total) by mouth daily with supper. 90 tablet 3  . rosuvastatin (CRESTOR) 40 MG tablet Take 1 tablet (40 mg total) by mouth daily. (Patient taking differently: Take 40 mg by mouth every evening.) 90 tablet 3  . Wheat Dextrin (BENEFIBER PO) Take 2 Scoops by mouth 2 (two) times  daily.    [name] antiseptic oral rinse (BIOTENE) LIQD 15 mLs by Mouth Rinse route in the morning and at bedtime.    . triamcinolone (KENALOG) 0.1 % Apply 1 application topically 2 (two) times daily as needed (skin irritation). (Patient not taking: Reported on 04/13/2020)    . VENTOLIN HFA 108 (90 Base) MCG/ACT inhaler INHALE 1 TO 2 PUFFS INTO THE LUNGS EVERY 4 HOURS AS NEEDED FOR WHEEZING OR SHORTNESS OF BREATH (Patient not taking: Reported on 04/13/2020) 18 g 5   No current facility-administered medications for this visit.    Allergies:   Sulfa antibiotics   Social History:  The patient  reports that he quit smoking about 40 years ago. His smoking use included cigarettes. He has a 30.00 pack-year smoking history. He has never used smokeless tobacco. He reports that he does not drink alcohol and does not use drugs.   Family History:  The patient's   family history includes Breast cancer in his maternal grandmother; Coronary artery disease in his father; Gallstones in his mother; Goiter in his paternal grandmother; Heart attack in his father; Heart disease in his mother; Hypertension in his sister; Nephrolithiasis in his father and son; Other in his mother.   ROS:  Please see the history of present illness.   All other systems are personally reviewed and negative.    Exam:    Vital Signs:  BP (!) 170/70   Pulse 74   Temp (!) 97.1 F (36.2 C)   Ht _0  (1.778 m)   Wt 206 lb (93.4 kg)   BMI 29.56 kg/m     Labs/Other Tests and Data Reviewed:    Recent Labs: 11/17/2019: ALT 24; TSH 1.96 01/18/2020: BUN 13; Creatinine, Ser 1.00; Hemoglobin 15.5; Platelets 309; Potassium 4.1; Sodium 139   Wt Readings from Last 3 Encounters:  04/13/20 206 lb (93.4 kg)  03/29/20 212 lb (96.2 kg)  02/14/20 206 lb (93.4 kg)     Other studies personally reviewed: Additional studies/ records that were reviewed today include: notes from gen cards  Last device remote is  reviewed from [place] PDF dated 2/22 which  reveals normal device function,   arrhythmias - none     ASSESSMENT & PLAN:   Ventricular tachycardia  Ischemic and nonischemic cardiomyopathy  Congestive heart failure-chronic-systolic  CRT-D-St. Jude's   The patient's device was interrogated.  The information was reviewed. No changes were made in the programming.     COPD pulm fibrosis  Atrial fibrillation  Gait instability   Persistent afib  But scant over the last few years.  With decreased exercise tolerance, and esp with long hx of having maintianed sinus, would DCCV and would not add Anti-arrhythmic drugs for afib at this time.  If his recurrent afib are relatively infrequent, ie less than every 4-6 months, and if we can demonstrate improved functional status following restoring sinus,, I would not use drugs and DCCV as needed for recurrent afib If the frequency is greater than that, and symptoms diminish with sinus, then as outlined by BCr would need to consider dofetilide or catheter ablation for the maintaining of sinus If there is no improvement in function following sinus, I would declare his afib permanent, although there are [place] data( which do not apply to him given his LA enlargement, ) that suggest ablation has hard endpoint benefit even in the absence osf symptom improvement-- would want to reassess LA size prior to considering such a thing BP elevated  Will need to recheck as this is quite anomalous ( most recorded BPs about 125 )  COVID 19 screen The patient denies symptoms of COVID 19 at this time.  The importance of social distancing was discussed today.  Follow-up:  2-3 months afib clinic, BCr, or EP app Next remote: As Scheduled   Current medicines are reviewed at length with the patient today.   The patient does not have concerns regarding his medicines.  The following changes were made today:  none  Labs/ tests ordered today include: DCCV No orders of the defined types were placed in this  encounter.   Future tests ( post COVID )     Patient Risk:  after full review of this patients clinical status, I feel that they are at moderate risk at this time.  Today, I have spent 17 minutes with the patient with telehealth technology discussing the above.  Signed, Virl Axe, MD  04/14/2020 10:26 AM     [facility] [address] [place] [address] [phone] (office) [phone] (fax)

## 2020-04-17 ENCOUNTER — Telehealth (HOSPITAL_COMMUNITY): Payer: Self-pay

## 2020-04-17 NOTE — Telephone Encounter (Signed)
Detailed instructions left on the patient's answering machine. Asked to call back with any questions. S.Elery Cadenhead EMTP

## 2020-04-18 ENCOUNTER — Ambulatory Visit (HOSPITAL_COMMUNITY): Payer: Medicare Other | Attending: Internal Medicine

## 2020-04-18 ENCOUNTER — Other Ambulatory Visit: Payer: Self-pay

## 2020-04-18 DIAGNOSIS — R072 Precordial pain: Secondary | ICD-10-CM

## 2020-04-18 LAB — MYOCARDIAL PERFUSION IMAGING
LV dias vol: 258 mL (ref 62–150)
LV sys vol: 217 mL
Peak HR: 70 {beats}/min
Rest HR: 70 {beats}/min
SDS: 2
SRS: 44
SSS: 46
TID: 0.98

## 2020-04-18 MED ORDER — TECHNETIUM TC 99M TETROFOSMIN IV KIT
9.9000 | PACK | Freq: Once | INTRAVENOUS | Status: AC | PRN
  Administered 2020-04-18: 9.9 via INTRAVENOUS
  Filled 2020-04-18: qty 10

## 2020-04-18 MED ORDER — REGADENOSON 0.4 MG/5ML IV SOLN
0.4000 mg | Freq: Once | INTRAVENOUS | Status: AC
  Administered 2020-04-18: 0.4 mg via INTRAVENOUS

## 2020-04-18 MED ORDER — TECHNETIUM TC 99M TETROFOSMIN IV KIT
31.8000 | PACK | Freq: Once | INTRAVENOUS | Status: AC | PRN
  Administered 2020-04-18: 31.8 via INTRAVENOUS
  Filled 2020-04-18: qty 32

## 2020-04-18 NOTE — Addendum Note (Signed)
Addended by: Thora Lance on: 04/18/2020 09:25 AM   Modules accepted: Orders

## 2020-04-18 NOTE — Patient Instructions (Addendum)
Medication Instructions:  Your physician recommends that you continue on your current medications as directed. Please refer to the Current Medication list given to you today.  *If you need a refill on your cardiac medications before your next appointment, please call your pharmacy*   Lab Work: CBC and BMET prior to DCCV  If you have labs (blood work) drawn today and your tests are completely normal, you will receive your results only by: [name] MyChart Message (if you have MyChart) OR . A paper copy in the mail If you have any lab test that is abnormal or we need to change your treatment, we will call you to review the results.   Testing/Procedures: Your physician has recommended that you have a Cardioversion (DCCV). Electrical Cardioversion uses a jolt of electricity to your heart either through paddles or wired patches attached to your chest. This is a controlled, usually prescheduled, procedure. Defibrillation is done under light anesthesia in the hospital, and you usually go home the day of the procedure. This is done to get your heart back into a normal rhythm. You are not awake for the procedure. Please see the instruction sheet given to you today.    Follow-Up: At [facility], you and your health needs are our priority.  As part of our continuing mission to provide you with exceptional heart care, we have created designated Provider Care Teams.  These Care Teams include your primary Cardiologist (physician) and Advanced Practice Providers (APPs -  Physician Assistants and Nurse Practitioners) who all work together to provide you with the care you need, when you need it.  We recommend signing up for the patient portal called "MyChart".  Sign up information is provided on this After Visit Summary.  MyChart is used to connect with patients for Virtual Visits (Telemedicine).  Patients are able to view lab/test results, encounter notes, upcoming appointments, etc.  Non-urgent messages can be  sent to your provider as well.   To learn more about what you can do with MyChart, go to [email or web address].    Your next appointment:   To be scheduled   Other Instructions  Dear [name], [name] are scheduled for a Cardioverson on 05/10/2020 with [name].  Please arrive at the [facility] (Main Entrance A) at [facility]: [address] [place], [place] [address] at 9:00am  DIET: Nothing to eat or drink after midnight except a sip of water with medications (see medication instructions below)  Medication Instructions: Hold all morning medications  Continue your anticoagulant: Xarelto without missing a dose. You will need to continue your anticoagulant after your procedure until you  are told by your Provider that it is safe to stop.   Labs: Please complete your labs at [name] office on Monday, 05/08/2020 at 1:30pm.  You will complete your Covid Screening on Monday 05/08/2020 at 2:30pm at [facility].  You must have a responsible person to drive you home and stay in the waiting area during your procedure. Failure to do so could result in cancellation.  Bring your insurance cards.  *Special Note: Every effort is made to have your procedure done on time. Occasionally there are emergencies that occur at the hospital that may cause delays. Please be patient if a delay does occur.

## 2020-04-22 ENCOUNTER — Other Ambulatory Visit: Payer: Self-pay | Admitting: Emergency Medicine

## 2020-04-24 DIAGNOSIS — Z23 Encounter for immunization: Secondary | ICD-10-CM | POA: Diagnosis not present

## 2020-05-03 ENCOUNTER — Other Ambulatory Visit: Payer: Medicare Other

## 2020-05-03 ENCOUNTER — Other Ambulatory Visit (HOSPITAL_COMMUNITY): Payer: Medicare Other

## 2020-05-08 ENCOUNTER — Other Ambulatory Visit (HOSPITAL_COMMUNITY)
Admission: RE | Admit: 2020-05-08 | Discharge: 2020-05-08 | Disposition: A | Discharge status: Home / Self Care | Payer: Medicare Other | Source: Ambulatory Visit | Attending: Cardiovascular Disease | Admitting: Cardiovascular Disease

## 2020-05-08 ENCOUNTER — Other Ambulatory Visit: Payer: Medicare Other | Admitting: *Deleted

## 2020-05-08 ENCOUNTER — Other Ambulatory Visit: Payer: Self-pay

## 2020-05-08 DIAGNOSIS — Z01812 Encounter for preprocedural laboratory examination: Secondary | ICD-10-CM | POA: Diagnosis not present

## 2020-05-08 DIAGNOSIS — I255 Ischemic cardiomyopathy: Secondary | ICD-10-CM

## 2020-05-08 DIAGNOSIS — I480 Paroxysmal atrial fibrillation: Secondary | ICD-10-CM

## 2020-05-08 DIAGNOSIS — Z20822 Contact with and (suspected) exposure to covid-19: Secondary | ICD-10-CM | POA: Diagnosis not present

## 2020-05-08 LAB — CBC
Hematocrit: 44.5 % (ref 37.5–51.0)
Hemoglobin: 15.1 g/dL (ref 13.0–17.7)
MCH: 33.3 pg — ABNORMAL HIGH (ref 26.6–33.0)
MCHC: 33.9 g/dL (ref 31.5–35.7)
MCV: 98 fL — ABNORMAL HIGH (ref 79–97)
Platelets: 309 10*3/uL (ref 150–450)
RBC: 4.54 x10E6/uL (ref 4.14–5.80)
RDW: 14.1 % (ref 11.6–15.4)
WBC: 8 10*3/uL (ref 3.4–10.8)

## 2020-05-08 LAB — BASIC METABOLIC PANEL
BUN/Creatinine Ratio: 11 (ref 10–24)
BUN: 12 mg/dL (ref 8–27)
CO2: 25 mmol/L (ref 20–29)
Calcium: 9.1 mg/dL (ref 8.6–10.2)
Chloride: 102 mmol/L (ref 96–106)
Creatinine, Ser: 1.05 mg/dL (ref 0.76–1.27)
Glucose: 105 mg/dL — ABNORMAL HIGH (ref 65–99)
Potassium: 4.3 mmol/L (ref 3.5–5.2)
Sodium: 138 mmol/L (ref 134–144)
eGFR: 73 mL/min/{1.73_m2} (ref >59)

## 2020-05-09 LAB — SARS CORONAVIRUS 2 (TAT 6-24 HRS): SARS Coronavirus 2: NEGATIVE

## 2020-05-10 ENCOUNTER — Encounter (HOSPITAL_COMMUNITY)
Admission: RE | Disposition: A | Discharge status: Home / Self Care | Payer: Self-pay | Source: Ambulatory Visit | Attending: Cardiovascular Disease

## 2020-05-10 ENCOUNTER — Other Ambulatory Visit: Payer: Self-pay

## 2020-05-10 ENCOUNTER — Encounter (HOSPITAL_COMMUNITY): Payer: Self-pay | Admitting: Cardiovascular Disease

## 2020-05-10 ENCOUNTER — Ambulatory Visit (HOSPITAL_COMMUNITY)
Admission: RE | Admit: 2020-05-10 | Discharge: 2020-05-10 | Disposition: A | Discharge status: Home / Self Care | Payer: Medicare Other | Source: Ambulatory Visit | Attending: Cardiovascular Disease | Admitting: Cardiovascular Disease

## 2020-05-10 ENCOUNTER — Ambulatory Visit (HOSPITAL_COMMUNITY): Payer: Medicare Other | Admitting: Certified Registered Nurse Anesthetist

## 2020-05-10 DIAGNOSIS — I428 Other cardiomyopathies: Secondary | ICD-10-CM | POA: Diagnosis not present

## 2020-05-10 DIAGNOSIS — J449 Chronic obstructive pulmonary disease, unspecified: Secondary | ICD-10-CM | POA: Diagnosis not present

## 2020-05-10 DIAGNOSIS — I5042 Chronic combined systolic (congestive) and diastolic (congestive) heart failure: Secondary | ICD-10-CM | POA: Diagnosis not present

## 2020-05-10 DIAGNOSIS — J8410 Pulmonary fibrosis, unspecified: Secondary | ICD-10-CM | POA: Diagnosis not present

## 2020-05-10 DIAGNOSIS — E559 Vitamin D deficiency, unspecified: Secondary | ICD-10-CM | POA: Diagnosis not present

## 2020-05-10 DIAGNOSIS — Z7951 Long term (current) use of inhaled steroids: Secondary | ICD-10-CM | POA: Insufficient documentation

## 2020-05-10 DIAGNOSIS — Z95810 Presence of automatic (implantable) cardiac defibrillator: Secondary | ICD-10-CM | POA: Insufficient documentation

## 2020-05-10 DIAGNOSIS — Z87891 Personal history of nicotine dependence: Secondary | ICD-10-CM | POA: Diagnosis not present

## 2020-05-10 DIAGNOSIS — Z7901 Long term (current) use of anticoagulants: Secondary | ICD-10-CM | POA: Diagnosis not present

## 2020-05-10 DIAGNOSIS — Z79899 Other long term (current) drug therapy: Secondary | ICD-10-CM | POA: Diagnosis not present

## 2020-05-10 DIAGNOSIS — E785 Hyperlipidemia, unspecified: Secondary | ICD-10-CM | POA: Diagnosis not present

## 2020-05-10 DIAGNOSIS — R2689 Other abnormalities of gait and mobility: Secondary | ICD-10-CM | POA: Diagnosis not present

## 2020-05-10 DIAGNOSIS — I5022 Chronic systolic (congestive) heart failure: Secondary | ICD-10-CM | POA: Insufficient documentation

## 2020-05-10 DIAGNOSIS — I4819 Other persistent atrial fibrillation: Secondary | ICD-10-CM | POA: Diagnosis not present

## 2020-05-10 DIAGNOSIS — Z882 Allergy status to sulfonamides status: Secondary | ICD-10-CM | POA: Diagnosis not present

## 2020-05-10 DIAGNOSIS — I472 Ventricular tachycardia: Secondary | ICD-10-CM | POA: Diagnosis not present

## 2020-05-10 DIAGNOSIS — I4891 Unspecified atrial fibrillation: Secondary | ICD-10-CM | POA: Diagnosis not present

## 2020-05-10 HISTORY — PX: CARDIOVERSION: SHX1299

## 2020-05-10 SURGERY — CARDIOVERSION
Anesthesia: General

## 2020-05-10 MED ORDER — PROPOFOL 10 MG/ML IV BOLUS
INTRAVENOUS | Status: DC | PRN
  Administered 2020-05-10: 20 mg via INTRAVENOUS
  Administered 2020-05-10: 40 mg via INTRAVENOUS

## 2020-05-10 MED ORDER — SODIUM CHLORIDE 0.9 % IV SOLN: INTRAVENOUS | Status: DC | PRN

## 2020-05-10 NOTE — Transfer of Care (Signed)
Immediate Anesthesia Transfer of Care Note  Patient: [name]  Procedure(s) Performed: CARDIOVERSION (N/A )  Patient Location: Endoscopy Unit  Anesthesia Type:General  Level of Consciousness: drowsy and patient cooperative  Airway & Oxygen Therapy: Patient Spontanous Breathing  Post-op Assessment: Report given to RN and Post -op Vital signs reviewed and stable  Post vital signs: Reviewed and stable  Last Vitals:  Vitals Value Taken Time  BP 95/65   Temp    Pulse 74   Resp 24   SpO2 92     Last Pain:  Vitals:   05/10/20 1003  TempSrc: Temporal  PainSc: 0-No pain         Complications: No complications documented.

## 2020-05-10 NOTE — Discharge Instructions (Signed)
Electrical Cardioversion Electrical cardioversion is the delivery of a jolt of electricity to restore a normal rhythm to the heart. A rhythm that is too fast or is not regular keeps the heart from pumping well. In this procedure, sticky patches or metal paddles are placed on the chest to deliver electricity to the heart from a device. This procedure may be done in an emergency if:  There is low or no blood pressure as a result of the heart rhythm.  Normal rhythm must be restored as fast as possible to protect the brain and heart from further damage.  It may save a life. This may also be a scheduled procedure for irregular or fast heart rhythms that are not immediately life-threatening. Follow these instructions at home:  Do not drive for 24 hours if you were given a sedative during your procedure.  Take over-the-counter and prescription medicines only as told by your health care provider.  Ask your health care provider how to check your pulse. Check it often.  Rest for 48 hours after the procedure or as told by your health care provider.  Avoid or limit your caffeine use as told by your health care provider.  Keep all follow-up visits as told by your health care provider. This is important. Contact a health care provider if:  You feel like your heart is beating too quickly or your pulse is not regular.  You have a serious muscle cramp that does not go away. Get help right away if:  You have discomfort in your chest.  You are dizzy or you feel faint.  You have trouble breathing or you are short of breath.  Your speech is slurred.  You have trouble moving an arm or leg on one side of your body.  Your fingers or toes turn cold or blue. Summary  Electrical cardioversion is the delivery of a jolt of electricity to restore a normal rhythm to the heart.  This procedure may be done right away in an emergency or may be a scheduled procedure if the condition is not an  emergency.  Generally, this is a safe procedure.  After the procedure, check your pulse often as told by your health care provider.

## 2020-05-10 NOTE — Op Note (Signed)
Procedure: Electrical Cardioversion Indications:  Atrial Fibrillation  Procedure Details:  Consent: Risks of procedure as well as the alternatives and risks of each were explained to the (patient/caregiver).  Consent for procedure obtained.  Time Out: Verified patient identification, verified procedure, site/side was marked, verified correct patient position, special equipment/implants available, medications/allergies/relevent history reviewed, required imaging and test results available.  Performed  Patient placed on cardiac monitor, pulse oximetry, supplemental oxygen as necessary.  Sedation given: Dr. Ermalene Postin, propofol 60 mg IV Pacer pads placed anterior and posterior chest.  Cardioverted 1 time(s).  Cardioversion with synchronized biphasic 120J shock.  Evaluation: Findings: Post procedure EKG shows: A paced BiV paced rhythm Complications: None Patient did tolerate procedure well.  Comprehensive device check after DCCV shows stable lead parameters and normal device function.  Time Spent Directly with the Patient:  30 minutes   Cedricka Sackrider 05/10/2020, 10:35 AM

## 2020-05-10 NOTE — Interval H&P Note (Signed)
History and Physical Interval Note:  05/10/2020 10:04 AM  [name]  has presented today for surgery, with the diagnosis of AFIB.  The various methods of treatment have been discussed with the patient and family. After consideration of risks, benefits and other options for treatment, the patient has consented to  Procedure(s): CARDIOVERSION (N/A) as a surgical intervention.  The patient's history has been reviewed, patient examined, no change in status, stable for surgery.  I have reviewed the patient's chart and labs.  Questions were answered to the patient's satisfaction.     Ireene Ballowe

## 2020-05-10 NOTE — Anesthesia Procedure Notes (Signed)
Procedure Name: General with mask airway Date/Time: 05/10/2020 10:00 AM Performed by: Kathryne Hitch, CRNA Pre-anesthesia Checklist: Patient identified, Emergency Drugs available, Suction available and Patient being monitored Patient Re-evaluated:Patient Re-evaluated prior to induction Oxygen Delivery Method: Ambu bag Preoxygenation: Pre-oxygenation with 100% oxygen Induction Type: IV induction Ventilation: Mask ventilation without difficulty Placement Confirmation: positive ETCO2 Dental Injury: Teeth and Oropharynx as per pre-operative assessment

## 2020-05-11 ENCOUNTER — Encounter (HOSPITAL_COMMUNITY): Payer: Self-pay | Admitting: Cardiovascular Disease

## 2020-05-12 NOTE — Anesthesia Postprocedure Evaluation (Signed)
Anesthesia Post Note  Patient: [name]  Procedure(s) Performed: CARDIOVERSION (N/A )     Patient location during evaluation: Endoscopy Anesthesia Type: General Level of consciousness: awake and alert Pain management: pain level controlled Vital Signs Assessment: post-procedure vital signs reviewed and stable Respiratory status: spontaneous breathing, nonlabored ventilation, respiratory function stable and patient connected to nasal cannula oxygen Cardiovascular status: stable Postop Assessment: no apparent nausea or vomiting Anesthetic complications: no   No complications documented.  Last Vitals:  Vitals:   05/10/20 1049 05/10/20 1054  BP: (!) 96/59 94/65  Pulse: 62 64  Resp: 18 17  Temp:    SpO2: 96% 96%    Last Pain:  Vitals:   05/10/20 1054  TempSrc:   PainSc: 0-No pain                 Brighten Buzzelli

## 2020-05-12 NOTE — Anesthesia Preprocedure Evaluation (Signed)
Anesthesia Evaluation   Patient awake    Reviewed: Allergy & Precautions, NPO status , Patient's Chart, lab work & pertinent test results  History of Anesthesia Complications Negative for: history of anesthetic complications  Airway Mallampati: III  TM Distance: >3 FB Neck ROM: Full    Dental  (+) Dental Advisory Given   Pulmonary sleep apnea , COPD, former smoker,  History of restrictive lung disease   breath sounds clear to auscultation       Cardiovascular hypertension, + CAD, + Past MI and +CHF  + dysrhythmias Atrial Fibrillation and Ventricular Tachycardia + pacemaker + Cardiac Defibrillator (BiVICD, )  Rhythm:Irregular  Echo January 2017 with EF 20-25%, global hypokinesis   Neuro/Psych TIA   GI/Hepatic GERD  ,diverticulosis   Endo/Other  negative endocrine ROSObese  Renal/GU Renal disease     Musculoskeletal negative musculoskeletal ROS (+)   Abdominal Normal abdominal exam  (+)   Peds  Hematology   Anesthesia Other Findings   Reproductive/Obstetrics                             Anesthesia Physical Anesthesia Plan  ASA: III  Anesthesia Plan: General   Post-op Pain Management:    Induction: Intravenous  PONV Risk Score and Plan: 2 and Treatment may vary due to age or medical condition  Airway Management Planned: Mask  Additional Equipment: None  Intra-op Plan:   Post-operative Plan:   Informed Consent: I have reviewed the patients History and Physical, chart, labs and discussed the procedure including the risks, benefits and alternatives for the proposed anesthesia with the patient or authorized representative who has indicated his/her understanding and acceptance.     Dental advisory given  Plan Discussed with: CRNA  Anesthesia Plan Comments:         Anesthesia Quick Evaluation

## 2020-05-16 ENCOUNTER — Other Ambulatory Visit: Payer: Self-pay | Admitting: Emergency Medicine

## 2020-05-17 ENCOUNTER — Encounter: Payer: Self-pay | Admitting: Family Medicine

## 2020-05-17 ENCOUNTER — Other Ambulatory Visit: Payer: Self-pay

## 2020-05-17 ENCOUNTER — Ambulatory Visit (INDEPENDENT_AMBULATORY_CARE_PROVIDER_SITE_OTHER): Payer: Medicare Other | Admitting: Family Medicine

## 2020-05-17 VITALS — BP 110/70 | HR 73 | Temp 96.8°F | Resp 17 | Ht 70.0 in | Wt 210.4 lb

## 2020-05-17 DIAGNOSIS — I255 Ischemic cardiomyopathy: Secondary | ICD-10-CM

## 2020-05-17 DIAGNOSIS — K5909 Other constipation: Secondary | ICD-10-CM

## 2020-05-17 DIAGNOSIS — E7800 Pure hypercholesterolemia, unspecified: Secondary | ICD-10-CM | POA: Diagnosis not present

## 2020-05-17 DIAGNOSIS — I10 Essential (primary) hypertension: Secondary | ICD-10-CM | POA: Diagnosis not present

## 2020-05-17 LAB — CBC WITH DIFFERENTIAL/PLATELET
Basophils Absolute: 0.1 10*3/uL (ref 0.0–0.1)
Basophils Relative: 1 % (ref 0.0–3.0)
Eosinophils Absolute: 0.6 10*3/uL (ref 0.0–0.7)
Eosinophils Relative: 7.6 % — ABNORMAL HIGH (ref 0.0–5.0)
HCT: 44.3 % (ref 39.0–52.0)
Hemoglobin: 14.9 g/dL (ref 13.0–17.0)
Lymphocytes Relative: 24.9 % (ref 12.0–46.0)
Lymphs Abs: 1.9 10*3/uL (ref 0.7–4.0)
MCHC: 33.7 g/dL (ref 30.0–36.0)
MCV: 100.4 fl — ABNORMAL HIGH (ref 78.0–100.0)
Monocytes Absolute: 0.6 10*3/uL (ref 0.1–1.0)
Monocytes Relative: 7.9 % (ref 3.0–12.0)
Neutro Abs: 4.6 10*3/uL (ref 1.4–7.7)
Neutrophils Relative %: 58.6 % (ref 43.0–77.0)
Platelets: 286 10*3/uL (ref 150.0–400.0)
RBC: 4.41 Mil/uL (ref 4.22–5.81)
RDW: 14.6 % (ref 11.5–15.5)
WBC: 7.8 10*3/uL (ref 4.0–10.5)

## 2020-05-17 LAB — BASIC METABOLIC PANEL
BUN: 16 mg/dL (ref 6–23)
CO2: 28 mEq/L (ref 19–32)
Calcium: 9.3 mg/dL (ref 8.4–10.5)
Chloride: 104 mEq/L (ref 96–112)
Creatinine, Ser: 0.93 mg/dL (ref 0.40–1.50)
GFR: 78.57 mL/min (ref >60.00)
Glucose, Bld: 116 mg/dL — ABNORMAL HIGH (ref 70–99)
Potassium: 4.3 mEq/L (ref 3.5–5.1)
Sodium: 137 mEq/L (ref 135–145)

## 2020-05-17 LAB — HEPATIC FUNCTION PANEL
ALT: 21 U/L (ref 0–53)
AST: 21 U/L (ref 0–37)
Albumin: 4 g/dL (ref 3.5–5.2)
Alkaline Phosphatase: 49 U/L (ref 39–117)
Bilirubin, Direct: 0.1 mg/dL (ref 0.0–0.3)
Total Bilirubin: 0.7 mg/dL (ref 0.2–1.2)
Total Protein: 6.4 g/dL (ref 6.0–8.3)

## 2020-05-17 LAB — LIPID PANEL
Cholesterol: 135 mg/dL (ref 0–200)
HDL: 32.5 mg/dL — ABNORMAL LOW (ref >39.00)
LDL Cholesterol: 81 mg/dL (ref 0–99)
NonHDL: 102.41
Total CHOL/HDL Ratio: 4
Triglycerides: 105 mg/dL (ref 0.0–149.0)
VLDL: 21 mg/dL (ref 0.0–40.0)

## 2020-05-17 LAB — TSH: TSH: 2.01 u[IU]/mL (ref 0.35–4.50)

## 2020-05-17 NOTE — Patient Instructions (Signed)
Schedule your complete physical in 6 months We'll notify you of your lab results and make any changes if needed Continue the Miralax daily until you are having regular bowel movements and then take as needed Try and decrease your Benefiber/Metamucil intake to decrease gas and bloating Call with any questions or concerns Happy Spring!!!

## 2020-05-17 NOTE — Assessment & Plan Note (Signed)
Chronic problem.  Well controlled today on his multiple HF medications.  Currently asymptomatic.  Check labs.  No anticipated med changes.

## 2020-05-17 NOTE — Assessment & Plan Note (Signed)
Chronic problem.  Tolerating Crestor w/o difficulty.  Check labs.  Adjust meds prn

## 2020-05-17 NOTE — Assessment & Plan Note (Signed)
Deteriorated w/ recent procedures requiring anesthesia.  His increased fiber intake has resulted in excessive gas and bloating but minimal relief of constipation.  Encouraged him to continue Miralax as needed for regularity.  Pt expressed understanding and is in agreement w/ plan.

## 2020-05-17 NOTE — Progress Notes (Signed)
   Subjective:    Patient ID: [name], male    DOB: 19-Dec-1940, 79 y.o.   MRN: [id number]  HPI HTN- chronic problem, on Coreg 6.42m BID, Entresto 24/245mBID, eplerenone 25102maily, Lasix 9m83mily w/ good control.  Denies CP, SOB, HAs, visual changes, edema.  Hyperlipidemia- chronic problem, on Crestor 9mg61mly.  Denies abd pain, N/V  Constipation- pt has had 3 procedures requiring anesthesia since late January.  He has had issues w/ constipation since that time.  GI told him to take Benefiber twice daily and increase water intake.  Cards told him that was too much volume.  Stools w/ benefiber were hard and required him to strain.  He would then have to use Dulcolax suppositories.  He has recently started Miralax daily and it is helpful- less straining, stool is soft, normal in color.   Review of Systems For ROS see HPI   This visit occurred during the SARS-CoV-2 public health emergency.  Safety protocols were in place, including screening questions prior to the visit, additional usage of staff PPE, and extensive cleaning of exam room while observing appropriate contact time as indicated for disinfecting solutions.       Objective:   Physical Exam Vitals reviewed.  Constitutional:      General: He is not in acute distress.    Appearance: Normal appearance. He is well-developed. He is not ill-appearing.  HENT:     Head: Normocephalic and atraumatic.  Eyes:     Conjunctiva/sclera: Conjunctivae normal.     Pupils: Pupils are equal, round, and reactive to light.  Neck:     Thyroid: No thyromegaly.  Cardiovascular:     Rate and Rhythm: Normal rate and regular rhythm.     Pulses: Normal pulses.     Heart sounds: Normal heart sounds. No murmur heard.   Pulmonary:     Effort: Pulmonary effort is normal. No respiratory distress.     Breath sounds: Normal breath sounds.  Abdominal:     General: Bowel sounds are normal. There is no distension.     Palpations: Abdomen is soft.   Musculoskeletal:     Cervical back: Normal range of motion and neck supple.     Right lower leg: No edema.     Left lower leg: No edema.  Lymphadenopathy:     Cervical: No cervical adenopathy.  Skin:    General: Skin is warm and dry.  Neurological:     Mental Status: He is alert and oriented to person, place, and time.     Cranial Nerves: No cranial nerve deficit.  Psychiatric:        Behavior: Behavior normal.           Assessment & Plan:

## 2020-05-19 ENCOUNTER — Ambulatory Visit (INDEPENDENT_AMBULATORY_CARE_PROVIDER_SITE_OTHER): Payer: Medicare Other

## 2020-05-19 DIAGNOSIS — I472 Ventricular tachycardia: Secondary | ICD-10-CM

## 2020-05-19 DIAGNOSIS — I4729 Other ventricular tachycardia: Secondary | ICD-10-CM

## 2020-05-19 LAB — CUP PACEART REMOTE DEVICE CHECK
Battery Remaining Longevity: 102 mo
Battery Remaining Percentage: 100 %
Brady Statistic RA Percent Paced: 1 %
Brady Statistic RV Percent Paced: 95 %
Date Time Interrogation Session: 20220506012100
HighPow Impedance: 44 Ohm
Implantable Lead Implant Date: 20020527
Implantable Lead Implant Date: 20020527
Implantable Lead Implant Date: 20131212
Implantable Lead Location: 753858
Implantable Lead Location: 753859
Implantable Lead Location: 753860
Implantable Lead Model: 148
Implantable Lead Model: 4194
Implantable Lead Model: 6940
Implantable Lead Serial Number: 120244
Implantable Pulse Generator Implant Date: 20220131
Lead Channel Impedance Value: 556 Ohm
Lead Channel Impedance Value: 647 Ohm
Lead Channel Impedance Value: 822 Ohm
Lead Channel Setting Pacing Amplitude: 2 V
Lead Channel Setting Pacing Amplitude: 2.5 V
Lead Channel Setting Pacing Amplitude: 2.5 V
Lead Channel Setting Pacing Pulse Width: 0.4 ms
Lead Channel Setting Pacing Pulse Width: 1 ms
Lead Channel Setting Sensing Sensitivity: 0.6 mV
Lead Channel Setting Sensing Sensitivity: 1 mV
Pulse Gen Serial Number: 149562

## 2020-05-24 ENCOUNTER — Ambulatory Visit (INDEPENDENT_AMBULATORY_CARE_PROVIDER_SITE_OTHER): Payer: Medicare Other | Admitting: Internal Medicine

## 2020-05-24 ENCOUNTER — Encounter: Payer: Self-pay | Admitting: Internal Medicine

## 2020-05-24 ENCOUNTER — Other Ambulatory Visit: Payer: Self-pay

## 2020-05-24 VITALS — BP 98/60 | HR 71 | Ht 70.0 in | Wt 209.0 lb

## 2020-05-24 DIAGNOSIS — I255 Ischemic cardiomyopathy: Secondary | ICD-10-CM | POA: Diagnosis not present

## 2020-05-24 DIAGNOSIS — Z95810 Presence of automatic (implantable) cardiac defibrillator: Secondary | ICD-10-CM

## 2020-05-24 DIAGNOSIS — I472 Ventricular tachycardia: Secondary | ICD-10-CM

## 2020-05-24 DIAGNOSIS — I5042 Chronic combined systolic (congestive) and diastolic (congestive) heart failure: Secondary | ICD-10-CM | POA: Diagnosis not present

## 2020-05-24 DIAGNOSIS — I480 Paroxysmal atrial fibrillation: Secondary | ICD-10-CM

## 2020-05-24 DIAGNOSIS — K648 Other hemorrhoids: Secondary | ICD-10-CM

## 2020-05-24 DIAGNOSIS — I4729 Other ventricular tachycardia: Secondary | ICD-10-CM

## 2020-05-24 NOTE — Patient Instructions (Signed)
Medication Instructions:  Your physician recommends that you continue on your current medications as directed. Please refer to the Current Medication list given to you today.  *If you need a refill on your cardiac medications before your next appointment, please call your pharmacy*   Lab Work: None ordered.  If you have labs (blood work) drawn today and your tests are completely normal, you will receive your results only by: [name] MyChart Message (if you have MyChart) OR . A paper copy in the mail If you have any lab test that is abnormal or we need to change your treatment, we will call you to review the results.   Testing/Procedures: None ordered.    Follow-Up: At [facility], you and your health needs are our priority.  As part of our continuing mission to provide you with exceptional heart care, we have created designated Provider Care Teams.  These Care Teams include your primary Cardiologist (physician) and Advanced Practice Providers (APPs -  Physician Assistants and Nurse Practitioners) who all work together to provide you with the care you need, when you need it.  We recommend signing up for the patient portal called "MyChart".  Sign up information is provided on this After Visit Summary.  MyChart is used to connect with patients for Virtual Visits (Telemedicine).  Patients are able to view lab/test results, encounter notes, upcoming appointments, etc.  Non-urgent messages can be sent to your provider as well.   To learn more about what you can do with MyChart, go to [email or web address].    Your next appointment:   9 months  The format for your next appointment:   In Person  Provider:   Virl Axe, MD

## 2020-05-24 NOTE — Progress Notes (Signed)
Patient Care Team: [name], MD as PCP - General (Family Medicine) Festus Aloe, MD as Consulting Physician (Urology) Michael Boston, MD as Consulting Physician (General Surgery) Stanford Breed Denice Bors, MD as Consulting Physician (Cardiology) Jodi Marble, MD as Consulting Physician (Otolaryngology) Deboraha Sprang, MD as Consulting Physician (Cardiology) Luberta Mutter, MD as Consulting Physician (Ophthalmology) Lamonte Sakai Rose Fillers, MD as Consulting Physician (Pulmonary Disease) Mauri Pole, MD as Consulting Physician (Gastroenterology) [name], [facility] as Pharmacist (Pharmacist)   HPI  CC atrial fibrillation  [name] is a 80 y.o. male   in the setting of ischemic heart disease and previously implanted CRT-D with subsequent left ventricular lead macro dislodgment. His symptoms however been quite stable. When he saw Dr. Lovena Le concerning lead extraction and lead implantation he was doing as well as he had for some time and it was decided not to pursue it then. Ultimately this was done  2013  He also has ventricular tachycardia for which he took amiodarone.   required adjunctive mexiletine. LFTs were abnormal and his Crestor was stopped.  Subsequently normalized even on amiodarone.  Amiodarone stopped spring 2019 because of issues of lung toxicity   He also has atrial fibrillation and is taking Xarelto   recurrent atrial fibrillation prompted cardioversion 4/22   Reverted to Atrial fib at this visit.  He has had no change in symptoms.  He continues to feel good.  His wife confirms that he is carrying on.  He still working in the garden.  Cardiomyopathy and heart failure well managed with guideline directed therapy currently on Entresto eplernonoe and carvedilol.          DATE TEST EF   10/08 Cath    20 % T RCA  12/13 Cath EF<20% T RCA   1/17 Echo   EF 20-25 %   6/21 Echo  30-35%        Date Cr K Hgb TSH PLCO LFTs LDL  2008     80%    11/17      1.91 56%(3/17) 31    6/18 0.95 4.0 15.2 2.19  30 162  5/19 0.95 4.0         10/20 0.87 4.1 14.2 1.54  18   1/22 1.0 4.1 15.5         Thromboembolic risk factors ( age -41, HTN-1, Vasc disease -1, CHF-1) for a CHADSVASc Score of  >= 5    Past Medical History:  Diagnosis Date  . Adenomatous colon polyp 2009; 06/2012   Colonoscopy 07/2007---Dr. Barron Schmid GI--Repeat 06/2012 showed one tubular adenoma w/out high grade dysplasia.  [name] AICD (automatic cardioverter/defibrillator) present    [employer]  . Atrial fibrillation ([place])   . Bilateral sensorineural hearing loss   . BPH (benign prostatic hypertrophy)    PSAs ok (followed by urologist): no LUTS  . CAD (coronary artery disease)    PTCA RCA 1997 (Inf/post MI);  . CHF (congestive heart failure) ([place])   . Chronic constipation   . Chronic rhinitis    ?vasomotor (Dr. Erik Obey)  . COPD (chronic obstructive pulmonary disease) ([place])   . Diverticulosis of colon 2005   Noted on Colonoscopies in 2005 and 2009 and on CT 2010  . Gallstones 06/2014   No evidence of cholecystitis on abd u/s 06/2014  . GERD (gastroesophageal reflux disease)   . Hepatic steatosis 2010; 2016   Noted on CT abd 2010 and on u/s abd 04/2009 (transaminasemia) and u/s  06/2014.  [name] Hyperlipidemia   . Hypertension   . Ischemic cardiomyopathy   . Microscopic hematuria    Cysto neg 02/2009 except for BPH-likely has microhem from his renal stone dz  . Nephrolithiasis    No distal stones on CT 2010; stable 8 mm RLP stone, 67m LMP stone 10/2013 plain film.  Abd u/s 06/2014: 9 mm nonobstructing lower pole right renal stone.  03/2015 urol: stable nonobstructing renal calc by CT 12/2014 and KUB 03/2015.  .Marland KitchenObstructive sleep apnea    no cpap uses since weight loss of 50 pounds  . Presence of permanent cardiac pacemaker    [employer] . Squamous cell carcinoma of back   . Ventricular tachycardia ([facility]     Past Surgical History:  Procedure Laterality Date  . ATRIAL  ABLATION SURGERY     flutter ablation 2002  . BREAST SURGERY Left 1990   "Tissue removed from breast"  . CARDIAC CATHETERIZATION    . CARDIOVERSION N/A 05/10/2020   Procedure: CARDIOVERSION;  Surgeon: CSanda Michele Judy MD;  Location: [facility];  Service: Cardiovascular;  Laterality: N/A;  . carotid doppler  09/25/12   No signif stenosis  . COLONOSCOPY N/A 07/13/2012   Diverticula ascending colon.  Polyp x 1-recall 5 yrs (Dr. MWatt Climes Procedure: COLONOSCOPY;  Surgeon: MJeryl Columbia MD;  Location: [facility];  Service: Endoscopy;  Laterality: N/A; polypectomy x 1   . COLONOSCOPY WITH PROPOFOL N/A 08/18/2015   Procedure: COLONOSCOPY WITH PROPOFOL;  Surgeon: KMauri Pole MD;  Location: [facility];  Service: Endoscopy;  Laterality: N/A;  . ESOPHAGOGASTRODUODENOSCOPY N/A 07/13/2012   Procedure: ESOPHAGOGASTRODUODENOSCOPY (EGD);  Surgeon: MJeryl Columbia MD;  [facility];  Service: Endoscopy;  Laterality: N/A;  NORMAL  . EXTRACORPOREAL SHOCK WAVE LITHOTRIPSY    . HEMORRHOID BANDING    . ICD     a) Guidant Contak H170- SVirl Axe MD 07/25/06 (second device) b) Medtronic CRT-D  . ICD GENERATOR CHANGEOUT N/A 02/14/2020   Procedure: ICD GENERATOR CHANGEOUT;  Surgeon: KDeboraha Sprang MD;  [facility];  Service: Cardiovascular;  Laterality: N/A;  . INGUINAL HERNIA REPAIR Bilateral 1975  . LEAD REVISION Bilateral 12/26/2011   Procedure: LEAD REVISION;  Surgeon: GEvans Lance MD;  [facility];  Service: Cardiovascular;  Laterality: Bilateral;  . PFTs  02/2015   Mod restriction (interstitial), mod diffusion defect (Dr. YAnnamaria Boots  . precancer area keratsis removed  05/2015    leftf orehead and left arm and right arm and back    Current Outpatient Medications  Medication Sig Dispense Refill  . antiseptic oral rinse (BIOTENE) LIQD 15 mLs by Mouth Rinse route in the morning and at bedtime.    . carvedilol (COREG) 6.25 MG tablet Take 1 tablet (6.25 mg total) by  mouth 2 (two) times daily with a meal. 180 tablet 3  . cholecalciferol (VITAMIN D) 1000 units tablet Take 1,000 Units by mouth every evening.    .Marland KitchenENTRESTO 24-26 MG TAKE 1 TABLET BY MOUTH TWICE DAILY 180 tablet 3  . eplerenone (INSPRA) 25 MG tablet Take 1 tablet (25 mg total) by mouth daily. (Patient taking differently: Take 25 mg by mouth daily with lunch.) 30 tablet 11  . fluticasone (FLONASE) 50 MCG/ACT nasal spray SHAKE LIQUID AND USE 2 SPRAYS IN EACH NOSTRIL TWICE DAILY (Patient taking differently: Place 2 sprays into both nostrils in the morning and at bedtime.) 9.9 mL 0  . furosemide (LASIX) 40 MG tablet TAKE 2  TABLETS BY MOUTH EVERY DAY (Patient taking differently: Take 80 mg by mouth in the morning.) 90 tablet 3  . ipratropium (ATROVENT) 0.06 % nasal spray USE 2 SPRAYS IN EACH NOSTRIL THREE TIMES DAILY (Patient taking differently: Place 2 sprays into both nostrils in the morning and at bedtime.) 15 mL 12  . loratadine (CLARITIN) 10 MG tablet Take 10 mg by mouth in the morning.    . Magnesium 250 MG TABS Take 250 mg by mouth every evening.    . mexiletine (MEXITIL) 150 MG capsule TAKE 2 CAPSULES(300 MG) BY MOUTH TWICE DAILY (Patient taking differently: Take 300 mg by mouth 2 (two) times daily.) 360 capsule 2  . Multiple Vitamin (MULTIVITAMIN WITH MINERALS) TABS Take 1 tablet by mouth every evening.    . nitroGLYCERIN (NITROSTAT) 0.4 MG SL tablet DISSOLVE 1 TABLET UNDER THE TONGUE EVERY 5 MINUTES AS NEEDED FOR CHEST PAIN (Patient taking differently: Place 0.4 mg under the tongue every 5 (five) minutes x 3 doses as needed for chest pain.) 25 tablet 5  . polyvinyl alcohol-povidone (HYPOTEARS) 1.4-0.6 % ophthalmic solution Place 1-2 drops into both eyes in the morning and at bedtime.    . potassium chloride SA (KLOR-CON) 20 MEQ tablet TAKE 1 TABLET(20 MEQ) BY MOUTH DAILY (Patient taking differently: Take 20 mEq by mouth daily with lunch.) 90 tablet 3  . prednisoLONE acetate (PRED FORTE) 1 %  ophthalmic suspension Place 1 drop into the left eye in the morning.    . Probiotic Product (PROBIOTIC DAILY PO) Take 1 capsule by mouth every evening.    . rivaroxaban (XARELTO) 20 MG TABS tablet Take 1 tablet (20 mg total) by mouth daily with supper. (Patient taking differently: Take 20 mg by mouth daily with lunch.) 90 tablet 3  . rosuvastatin (CRESTOR) 40 MG tablet Take 1 tablet (40 mg total) by mouth daily. (Patient taking differently: Take 40 mg by mouth every evening.) 90 tablet 3  . SYMBICORT 160-4.5 MCG/ACT inhaler INHALE 2 PUFFS INTO THE LUNGS TWICE DAILY 10.2 g 0  . triamcinolone (KENALOG) 0.1 % Apply 1 application topically 2 (two) times daily as needed (skin irritation).    . VENTOLIN HFA 108 (90 Base) MCG/ACT inhaler INHALE 1 TO 2 PUFFS INTO THE LUNGS EVERY 4 HOURS AS NEEDED FOR WHEEZING OR SHORTNESS OF BREATH (Patient taking differently: Inhale 1 puff into the lungs every 4 (four) hours as needed for shortness of breath or wheezing.) 18 g 5  . Wheat Dextrin (BENEFIBER PO) Take 2 Scoops by mouth 2 (two) times daily.     No current facility-administered medications for this visit.    Allergies  Allergen Reactions  . Sulfa Antibiotics Rash    Review of Systems negative except from HPI and PMH  Physical Exam BP 98/60   Pulse 71   Ht _0  (1.778 m)   Wt 209 lb (94.8 kg)   SpO2 97%   BMI 29.99 kg/m  Well developed and well nourished in no acute distress HENT normal Neck supple Clear Device pocket well healed; without hematoma or erythema.  There is no tethering  Regular rate and rhythm, no   murmur Abd-soft with active BS No Clubbing cyanosis edema Skin-warm and dry A & Oriented  Grossly normal sensory and motor function  ECG Atrial fib with ventricular pacing at a BiV configuration with an upright QRS lead V1 and negative QRS lead I Assessment and  Plan   Ventricular tachycardia  Ischemic and nonischemic cardiomyopathy  Congestive heart  failure-chronic-systolic  CRT-D-[employer]         COPD pulm fibrosis  Atrial fibrillation-permanent  The patient's atrial fibrillation is now declared permanent  We will continue him on his current guideline directed therapy.  He is euvolemic.  No symptoms of ischemia.  Without symptoms of ischemia  No intercurre nt Ventricular tachycardia  Euvolemic continue current meds  Gait instability is of some concern.  We will arrange for a consultation with Dr. Carles Collet over [facility] neurology with her expertise in movement disorders.

## 2020-05-29 DIAGNOSIS — D485 Neoplasm of uncertain behavior of skin: Secondary | ICD-10-CM | POA: Diagnosis not present

## 2020-05-29 DIAGNOSIS — C44319 Basal cell carcinoma of skin of other parts of face: Secondary | ICD-10-CM | POA: Diagnosis not present

## 2020-05-29 DIAGNOSIS — L570 Actinic keratosis: Secondary | ICD-10-CM | POA: Diagnosis not present

## 2020-06-02 ENCOUNTER — Other Ambulatory Visit: Payer: Self-pay | Admitting: Emergency Medicine

## 2020-06-02 NOTE — Progress Notes (Signed)
Remote ICD transmission.   

## 2020-06-05 DIAGNOSIS — H1820 Unspecified corneal edema: Secondary | ICD-10-CM | POA: Diagnosis not present

## 2020-06-11 ENCOUNTER — Other Ambulatory Visit: Payer: Self-pay | Admitting: Emergency Medicine

## 2020-06-15 ENCOUNTER — Other Ambulatory Visit: Payer: Self-pay | Admitting: Cardiology

## 2020-06-15 NOTE — Progress Notes (Signed)
HPI: FU history of coronary artery disease, mixed ischemic and nonischemic cardiomyopathy, history of ICD, paroxysmal atrial fibrillation, and ventricular tachycardia. Previous abdominal ultrasound in April 2006 showed no aneurysm. Last cardiac catheterization in October 2008 showed an occluded right coronary artery with left-to-right collateralization. Ejection fraction is 20%. There was no other coronary disease noted. ABIs in December of 2011 were normal. Previous carotid Dopplers in Sept 2014 showed 0-39% stenosis bilaterally. The patient has had upgrade of his device to CRT-D. Patient had revision of his left ventricular lead in December 2013. Amiodarone discontinued March 2019 because of potential pulmonary toxicity. High resolution chest CT February 2020 showed mild pulmonary fibrosis, mild emphysema.  Echocardiogram repeated June 2021 and showed ejection fraction 30 to 35%, moderate left ventricular enlargement, grade 1 diastolic dysfunction, moderate left atrial enlargement, mild mitral regurgitation.  Nuclear study May 2022 showed ejection fraction 16%, large scar with no significant ischemia. Pt underwent DCCV 05/10/20.  At follow-up office visit patient was in recurrent atrial fibrillation and now felt to be permanent.  Since last seen, patient notes some fatigue but denies dyspnea, chest pain, palpitations or syncope.  No bleeding.  Current Outpatient Medications  Medication Sig Dispense Refill   antiseptic oral rinse (BIOTENE) LIQD 15 mLs by Mouth Rinse route in the morning and at bedtime.     carvedilol (COREG) 6.25 MG tablet Take 1 tablet (6.25 mg total) by mouth 2 (two) times daily with a meal. 180 tablet 3   cholecalciferol (VITAMIN D) 1000 units tablet Take 1,000 Units by mouth every evening.     ENTRESTO 24-26 MG TAKE 1 TABLET BY MOUTH TWICE DAILY 180 tablet 3   eplerenone (INSPRA) 25 MG tablet Take 1 tablet (25 mg total) by mouth daily. 30 tablet 11   fluticasone (FLONASE) 50  MCG/ACT nasal spray SHAKE LIQUID AND USE 2 SPRAYS IN EACH NOSTRIL TWICE DAILY 9.9 mL 0   furosemide (LASIX) 40 MG tablet TAKE 2 TABLETS BY MOUTH EVERY DAY 90 tablet 3   ipratropium (ATROVENT) 0.06 % nasal spray USE 2 SPRAYS IN EACH NOSTRIL THREE TIMES DAILY 15 mL 0   loratadine (CLARITIN) 10 MG tablet Take 10 mg by mouth in the morning.     Magnesium 250 MG TABS Take 250 mg by mouth every evening.     mexiletine (MEXITIL) 150 MG capsule TAKE 2 CAPSULES(300 MG) BY MOUTH TWICE DAILY 360 capsule 2   Multiple Vitamin (MULTIVITAMIN WITH MINERALS) TABS Take 1 tablet by mouth every evening.     nitroGLYCERIN (NITROSTAT) 0.4 MG SL tablet DISSOLVE 1 TABLET UNDER THE TONGUE EVERY 5 MINUTES AS NEEDED FOR CHEST PAIN (Patient taking differently: Place 0.4 mg under the tongue every 5 (five) minutes x 3 doses as needed for chest pain.) 25 tablet 5   polyvinyl alcohol-povidone (HYPOTEARS) 1.4-0.6 % ophthalmic solution Place 1-2 drops into both eyes in the morning and at bedtime.     potassium chloride SA (KLOR-CON) 20 MEQ tablet Take 1 tablet (20 mEq total) by mouth daily with lunch. 90 tablet 2   prednisoLONE acetate (PRED FORTE) 1 % ophthalmic suspension Place 1 drop into the left eye in the morning.     Probiotic Product (PROBIOTIC DAILY PO) Take 1 capsule by mouth every evening.     rivaroxaban (XARELTO) 20 MG TABS tablet Take 1 tablet (20 mg total) by mouth daily with supper. 90 tablet 3   rosuvastatin (CRESTOR) 40 MG tablet Take 1 tablet (40 mg total) by  mouth every evening. 90 tablet 2   SYMBICORT 160-4.5 MCG/ACT inhaler INHALE 2 PUFFS INTO THE LUNGS TWICE DAILY 10.2 g 0   triamcinolone (KENALOG) 0.1 % Apply 1 application topically 2 (two) times daily as needed (skin irritation).     VENTOLIN HFA 108 (90 Base) MCG/ACT inhaler INHALE 1 TO 2 PUFFS INTO THE LUNGS EVERY 4 HOURS AS NEEDED FOR WHEEZING OR SHORTNESS OF BREATH 18 g 5   Wheat Dextrin (BENEFIBER PO) Take 2 Scoops by mouth 2 (two) times daily.     No  current facility-administered medications for this visit.     Past Medical History:  Diagnosis Date   Adenomatous colon polyp 2009; 06/2012   Colonoscopy 07/2007---Dr. Barron Schmid GI--Repeat 06/2012 showed one tubular adenoma w/out high grade dysplasia.   AICD (automatic cardioverter/defibrillator) present    Chemical engineer   Atrial fibrillation ([place])    Bilateral sensorineural hearing loss    BPH (benign prostatic hypertrophy)    PSAs ok (followed by urologist): no LUTS   CAD (coronary artery disease)    PTCA RCA 1997 (Inf/post MI);   CHF (congestive heart failure) ([place])    Chronic constipation    Chronic rhinitis    ?vasomotor (Dr. Erik Obey)   COPD (chronic obstructive pulmonary disease) ([place])    Diverticulosis of colon 2005   Noted on Colonoscopies in 2005 and 2009 and on CT 2010   Gallstones 06/2014   No evidence of cholecystitis on abd u/s 06/2014   GERD (gastroesophageal reflux disease)    Hepatic steatosis 2010; 2016   Noted on CT abd 2010 and on u/s abd 04/2009 (transaminasemia) and u/s 06/2014.   Hyperlipidemia    Hypertension    Ischemic cardiomyopathy    Microscopic hematuria    Cysto neg 02/2009 except for BPH-likely has microhem from his renal stone dz   Nephrolithiasis    No distal stones on CT 2010; stable 8 mm RLP stone, 1m LMP stone 10/2013 plain film.  Abd u/s 06/2014: 9 mm nonobstructing lower pole right renal stone.  03/2015 urol: stable nonobstructing renal calc by CT 12/2014 and KUB 03/2015.   Obstructive sleep apnea    no cpap uses since weight loss of 50 pounds   Presence of permanent cardiac pacemaker    [employer]   Squamous cell carcinoma of back    Ventricular tachycardia ([facility]     Past Surgical History:  Procedure Laterality Date   ATRIAL ABLATION SURGERY     flutter ablation 2002   BREAST SURGERY Left 1990   "Tissue removed from breast"   [place] 05/10/2020   Procedure: CARDIOVERSION;  Surgeon:  CSanda Klein MD;  [facility];  Service: Cardiovascular;  Laterality: N/A;   carotid doppler  09/25/12   No signif stenosis   COLONOSCOPY N/A 07/13/2012   Diverticula ascending colon.  Polyp x 1-recall 5 yrs (Dr. MWatt Climes Procedure: COLONOSCOPY;  Surgeon: MJeryl Columbia MD;  [facility];  Service: Endoscopy;  Laterality: N/A; polypectomy x 1    COLONOSCOPY WITH PROPOFOL N/A 08/18/2015   Procedure: COLONOSCOPY WITH PROPOFOL;  Surgeon: KMauri Pole MD;  [facility];  Service: Endoscopy;  Laterality: N/A;   ESOPHAGOGASTRODUODENOSCOPY N/A 07/13/2012   Procedure: ESOPHAGOGASTRODUODENOSCOPY (EGD);  Surgeon: MJeryl Columbia MD;  [facility];  Service: Endoscopy;  Laterality: N/A;  NORMAL   EXTRACORPOREAL SHOCK WAVE LITHOTRIPSY     HEMORRHOID BANDING     ICD  a) Guidant Contak H170- Virl Axe, MD 07/25/06 (second device) b) Medtronic CRT-D   ICD GENERATOR CHANGEOUT N/A 02/14/2020   Procedure: ICD GENERATOR CHANGEOUT;  Surgeon: Deboraha Sprang, MD;  Location: [facility];  Service: Cardiovascular;  Laterality: N/A;   INGUINAL HERNIA REPAIR Bilateral 1975   LEAD REVISION Bilateral 12/26/2011   Procedure: LEAD REVISION;  Surgeon: Evans Lance, MD;  Location: [facility] CATH LAB;  Service: Cardiovascular;  Laterality: Bilateral;   PFTs  02/2015   Mod restriction (interstitial), mod diffusion defect (Dr. Annamaria Boots)   precancer area keratsis removed  05/2015    leftf orehead and left arm and right arm and back    Social History   Socioeconomic History   Marital status: Married    Spouse name: Not on file   Number of children: 3   Years of education: Not on file   Highest education level: Not on file  Occupational History   Occupation: Retired    Fish farm manager: RETIRED  Tobacco Use   Smoking status: Former    Packs/day: 2.00    Years: 15.00    Pack years: 30.00    Types: Cigarettes    Quit date: 01/15/1980    Years since quitting: 40.4   Smokeless  tobacco: Never  Vaping Use   Vaping Use: Never used  Substance and Sexual Activity   Alcohol use: No    Alcohol/week: 0.0 standard drinks   Drug use: No   Sexual activity: Not on file  Other Topics Concern   Not on file  Social History Narrative   Married.   Hx of cigarettes: quit in the 1980s.  No alc/drugs.   Social Determinants of Health   Financial Resource Strain: Low Risk    Difficulty of Paying Living Expenses: Not hard at all  Food Insecurity: No Food Insecurity   Worried About Charity fundraiser in the Last Year: Never true   Arboriculturist in the Last Year: Never true  Transportation Needs: Not on file  Physical Activity: Sufficiently Active   Days of Exercise per Week: 7 days   Minutes of Exercise per Session: 40 min  Stress: No Stress Concern Present   Feeling of Stress : Not at all  Social Connections: Moderately Isolated   Frequency of Communication with Friends and Family: More than three times a week   Frequency of Social Gatherings with Friends and Family: Once a week   Attends Religious Services: Never   Marine scientist or Organizations: No   Attends Music therapist: Never   Marital Status: Married  Human resources officer Violence: Not At Risk   Fear of Current or Ex-Partner: No   Emotionally Abused: No   Physically Abused: No   Sexually Abused: No    Family History  Problem Relation Age of Onset   Heart disease Mother    Other Mother        colon surgery for fistula   Gallstones Mother    Breast cancer Maternal Grandmother    Coronary artery disease Father        family hx of   Heart attack Father        4 stents/ pacemaker   Nephrolithiasis Father    Hypertension Sister    Goiter Paternal Grandmother    Nephrolithiasis Son    Colon cancer Neg Hx     ROS: Fatigue but Fatigue but no fevers or chills, productive cough, hemoptysis, dysphasia, odynophagia, melena, hematochezia, dysuria,  hematuria, rash, seizure activity,  orthopnea, PND, pedal edema, claudication. Remaining systems are negative.  Physical Exam: Well-developed well-nourished in no acute distress.  Skin is warm and dry.  HEENT is normal.  Neck is supple.  Chest is clear to auscultation with normal expansion.  Cardiovascular exam is regular rate and rhythm.  Abdominal exam nontender or distended. No masses palpated. Extremities show no edema. neuro grossly intact   A/P  1 permanent atrial fibrillation-plan to continue carvedilol and Xarelto.    2 coronary artery disease-he denies chest pain.  Most recent nuclear study showed large infarct but no ischemia.  Continue medical therapy.  Continue statin.  He is not on aspirin given need for Xarelto.  3 ischemic cardiomyopathy-continue Entresto and carvedilol.  4 chronic systolic congestive heart failure-he is euvolemic on examination.  Continue diuretics at present dose.    5 hypertension-patient's blood pressure is controlled.  Continue present medical regimen.  6 ventricular tachycardia-amiodarone discontinued previously due to potential lung toxicity.  Continue carvedilol and mexiletine.  7 biventricular ICD-follow-up Dr. Caryl Comes.  8 hyperlipidemia-continue statin.  Kirk Ruths, MD

## 2020-06-16 ENCOUNTER — Other Ambulatory Visit: Payer: Self-pay | Admitting: Emergency Medicine

## 2020-06-19 DIAGNOSIS — H1820 Unspecified corneal edema: Secondary | ICD-10-CM | POA: Diagnosis not present

## 2020-06-24 ENCOUNTER — Other Ambulatory Visit: Payer: Self-pay | Admitting: Cardiology

## 2020-06-26 DIAGNOSIS — Z23 Encounter for immunization: Secondary | ICD-10-CM | POA: Diagnosis not present

## 2020-06-28 ENCOUNTER — Encounter: Payer: Self-pay | Admitting: Cardiology

## 2020-06-28 ENCOUNTER — Other Ambulatory Visit: Payer: Self-pay

## 2020-06-28 ENCOUNTER — Ambulatory Visit (INDEPENDENT_AMBULATORY_CARE_PROVIDER_SITE_OTHER): Payer: Medicare Other | Admitting: Cardiology

## 2020-06-28 VITALS — BP 104/74 | HR 70 | Ht 70.0 in | Wt 209.0 lb

## 2020-06-28 DIAGNOSIS — I2510 Atherosclerotic heart disease of native coronary artery without angina pectoris: Secondary | ICD-10-CM

## 2020-06-28 DIAGNOSIS — I255 Ischemic cardiomyopathy: Secondary | ICD-10-CM

## 2020-06-28 DIAGNOSIS — Z4502 Encounter for adjustment and management of automatic implantable cardiac defibrillator: Secondary | ICD-10-CM | POA: Diagnosis not present

## 2020-06-28 DIAGNOSIS — I4821 Permanent atrial fibrillation: Secondary | ICD-10-CM | POA: Diagnosis not present

## 2020-06-28 NOTE — Patient Instructions (Signed)
  Follow-Up: At [facility], you and your health needs are our priority.  As part of our continuing mission to provide you with exceptional heart care, we have created designated Provider Care Teams.  These Care Teams include your primary Cardiologist (physician) and Advanced Practice Providers (APPs -  Physician Assistants and Nurse Practitioners) who all work together to provide you with the care you need, when you need it.  We recommend signing up for the patient portal called "MyChart".  Sign up information is provided on this After Visit Summary.  MyChart is used to connect with patients for Virtual Visits (Telemedicine).  Patients are able to view lab/test results, encounter notes, upcoming appointments, etc.  Non-urgent messages can be sent to your provider as well.   To learn more about what you can do with MyChart, go to [email or web address].    Your next appointment:   6 month(s)  The format for your next appointment:   In Person  Provider:   Kirk Ruths, MD

## 2020-06-29 DIAGNOSIS — C441192 Basal cell carcinoma of skin of left lower eyelid, including canthus: Secondary | ICD-10-CM | POA: Diagnosis not present

## 2020-06-29 DIAGNOSIS — Z85828 Personal history of other malignant neoplasm of skin: Secondary | ICD-10-CM | POA: Diagnosis not present

## 2020-07-01 ENCOUNTER — Other Ambulatory Visit: Payer: Self-pay | Admitting: Emergency Medicine

## 2020-07-03 ENCOUNTER — Other Ambulatory Visit: Payer: Self-pay | Admitting: Cardiology

## 2020-07-07 ENCOUNTER — Telehealth: Payer: Self-pay | Admitting: Physician Assistant

## 2020-07-07 NOTE — Telephone Encounter (Signed)
Patient concerned about his upcoming appt. on Monday because that is what Delsa Sale called about.

## 2020-07-07 NOTE — Telephone Encounter (Signed)
Pt is returning call from 07/06/20 to [place]. Please advise

## 2020-07-07 NOTE — Telephone Encounter (Signed)
Spoke with patient to confirm details of appt

## 2020-07-09 ENCOUNTER — Other Ambulatory Visit: Payer: Self-pay | Admitting: Emergency Medicine

## 2020-07-09 NOTE — Progress Notes (Deleted)
Cardiology Office Note Date:  07/09/2020  Patient ID:  [name] 1940-11-20, MRN [id number] PCP:  [name], MD  Cardiologist:  Dr. Stanford Breed Electrophysiologist: Dr. Caryl Comes    Chief Complaint:  *** ???  History of Present Illness: [name] is a 80 y.o. male with history of CAD (cardiac catheterization in October 2008 showed an occluded right coronary artery with left-to-right collateralization), chronic CHF (systolic), mixed ischemi/NICM, VT, AFib, COPD, HTN, HLD.  He saw Dr. Caryl Comes, April 2021, doing ok, discussed gait instability and recommended neurology eval.  No changes were made.  He saw Dr. Stanford Breed 12/15/19, , he was suspect to be in AF that day and asymptomatic, given no symptoms, no changes made.  I saw him 01/11/21 He feels very well. Reports excellent exertional capacity and denies any intolerances. No CP, palpitations or cardiac awareness f any kind.  He says he has been told he is having more AFib but does not tell any change at all. No SOB or DOE No dizzy spells, near syncope or syncope. No bleeding or signs of bleeding No symptoms with his Af, planned to get past his gen change and go from there Device had reached ERI and planned for gen change  Gen change 02/14/20  Saw Dr. Stanford Breed 03/29/20, mentioning some perhaps atypical sounding symptoms of angina, planned for stress test (Noted large territory of scar, EF 16%, planned for continued medical management)  He saw Dr. Caryl Comes later in March, planned for DCCV to evaluate symptoms improved or not in SR, if better in SR to DCCV prn if recurrences were fairly infrequent (every 4 mo or so), if more then that consideration for Tikosyn.  05/10/20; DCCV restored A/BV paced rhythm  He saw Dr. Caryl Comes 05/24/20, was back in Afib, feeling well, working in the garden and his Afib declared PERMANENT.  He saw Dr. Stanford Breed 06/28/20, some fatigue but doing well, no changes were made.  *** symptoms *** volume ***  bleeding, xarelto, labs, dose *** meds, CAD, CM   Device information BSCi CRT-D 12/20/2009 LV lead extraction >> new implant in 2013 RA/RV leads are from 2002 Gen change   AAD Mexiletine 2009 >> current Amiodarone d/c March 2019 2/2 pulmonary concerns/potential toxicity High resolution chest CT February 2020 showed mild pulmonary fibrosis, mild emphysema   Past Medical History:  Diagnosis Date   Adenomatous colon polyp 2009; 06/2012   Colonoscopy 07/2007---Dr. Barron Schmid GI--Repeat 06/2012 showed one tubular adenoma w/out high grade dysplasia.   AICD (automatic cardioverter/defibrillator) present    Chemical engineer   Atrial fibrillation ([place])    Bilateral sensorineural hearing loss    BPH (benign prostatic hypertrophy)    PSAs ok (followed by urologist): no LUTS   CAD (coronary artery disease)    PTCA RCA 1997 (Inf/post MI);   CHF (congestive heart failure) ([place])    Chronic constipation    Chronic rhinitis    ?vasomotor (Dr. Erik Obey)   COPD (chronic obstructive pulmonary disease) ([place])    Diverticulosis of colon 2005   Noted on Colonoscopies in 2005 and 2009 and on CT 2010   Gallstones 06/2014   No evidence of cholecystitis on abd u/s 06/2014   GERD (gastroesophageal reflux disease)    Hepatic steatosis 2010; 2016   Noted on CT abd 2010 and on u/s abd 04/2009 (transaminasemia) and u/s 06/2014.   Hyperlipidemia    Hypertension    Ischemic cardiomyopathy    Microscopic hematuria    Cysto neg 02/2009 except  for BPH-likely has microhem from his renal stone dz   Nephrolithiasis    No distal stones on CT 2010; stable 8 mm RLP stone, 60m LMP stone 10/2013 plain film.  Abd u/s 06/2014: 9 mm nonobstructing lower pole right renal stone.  03/2015 urol: stable nonobstructing renal calc by CT 12/2014 and KUB 03/2015.   Obstructive sleep apnea    no cpap uses since weight loss of 50 pounds   Presence of permanent cardiac pacemaker    [employer]   Squamous cell carcinoma of back     Ventricular tachycardia ([facility]     Past Surgical History:  Procedure Laterality Date   ATRIAL ABLATION SURGERY     flutter ablation 2002   BREAST SURGERY Left 1990   "Tissue removed from breast"   [place]/A 05/10/2020   Procedure: CARDIOVERSION;  Surgeon: CSanda Klein MD;  [facility];  Service: Cardiovascular;  Laterality: N/A;   carotid doppler  09/25/12   No signif stenosis   COLONOSCOPY N/A 07/13/2012   Diverticula ascending colon.  Polyp x 1-recall 5 yrs (Dr. MWatt Climes Procedure: COLONOSCOPY;  Surgeon: MJeryl Columbia MD;  [facility];  Service: Endoscopy;  Laterality: N/A; polypectomy x 1    COLONOSCOPY WITH PROPOFOL N/A 08/18/2015   Procedure: COLONOSCOPY WITH PROPOFOL;  Surgeon: KMauri Pole MD;  [facility];  Service: Endoscopy;  Laterality: N/A;   ESOPHAGOGASTRODUODENOSCOPY N/A 07/13/2012   Procedure: ESOPHAGOGASTRODUODENOSCOPY (EGD);  Surgeon: MJeryl Columbia MD;  [facility];  Service: Endoscopy;  Laterality: N/A;  NORMAL   EXTRACORPOREAL SHOCK WAVE LITHOTRIPSY     HEMORRHOID BANDING     ICD     a) Guidant Contak H170- SVirl Axe MD 07/25/06 (second device) b) Medtronic CRT-D   ICD GENERATOR CHANGEOUT N/A 02/14/2020   Procedure: ICD GENERATOR CHANGEOUT;  Surgeon: KDeboraha Sprang MD;  [facility];  Service: Cardiovascular;  Laterality: N/A;   INGUINAL HERNIA REPAIR Bilateral 1975   LEAD REVISION Bilateral 12/26/2011   Procedure: LEAD REVISION;  Surgeon: GEvans Lance MD;  [facility];  Service: Cardiovascular;  Laterality: Bilateral;   PFTs  02/2015   Mod restriction (interstitial), mod diffusion defect (Dr. YAnnamaria Boots   precancer area keratsis removed  05/2015    leftf orehead and left arm and right arm and back    Current Outpatient Medications  Medication Sig Dispense Refill   antiseptic oral rinse (BIOTENE) LIQD 15 mLs by Mouth Rinse route in the morning and at  bedtime.     carvedilol (COREG) 6.25 MG tablet TAKE 1 TABLET(6.25 MG) BY MOUTH TWICE DAILY WITH A MEAL 180 tablet 3   cholecalciferol (VITAMIN D) 1000 units tablet Take 1,000 Units by mouth every evening.     ENTRESTO 24-26 MG TAKE 1 TABLET BY MOUTH TWICE DAILY 180 tablet 3   eplerenone (INSPRA) 25 MG tablet Take 1 tablet (25 mg total) by mouth daily. 30 tablet 11   fluticasone (FLONASE) 50 MCG/ACT nasal spray SHAKE LIQUID AND USE 2 SPRAYS IN EACH NOSTRIL TWICE DAILY 9.9 mL 0   furosemide (LASIX) 40 MG tablet TAKE 2 TABLETS BY MOUTH EVERY DAY 90 tablet 3   ipratropium (ATROVENT) 0.06 % nasal spray USE 2 SPRAYS IN EACH NOSTRIL THREE TIMES DAILY 15 mL 0   loratadine (CLARITIN) 10 MG tablet Take 10 mg by mouth in the morning.     Magnesium 250 MG TABS Take 250 mg by  mouth every evening.     mexiletine (MEXITIL) 150 MG capsule TAKE 2 CAPSULES(300 MG) BY MOUTH TWICE DAILY 360 capsule 2   Multiple Vitamin (MULTIVITAMIN WITH MINERALS) TABS Take 1 tablet by mouth every evening.     nitroGLYCERIN (NITROSTAT) 0.4 MG SL tablet DISSOLVE 1 TABLET UNDER THE TONGUE EVERY 5 MINUTES AS NEEDED FOR CHEST PAIN (Patient taking differently: Place 0.4 mg under the tongue every 5 (five) minutes x 3 doses as needed for chest pain.) 25 tablet 5   polyvinyl alcohol-povidone (HYPOTEARS) 1.4-0.6 % ophthalmic solution Place 1-2 drops into both eyes in the morning and at bedtime.     potassium chloride SA (KLOR-CON) 20 MEQ tablet Take 1 tablet (20 mEq total) by mouth daily with lunch. 90 tablet 2   prednisoLONE acetate (PRED FORTE) 1 % ophthalmic suspension Place 1 drop into the left eye in the morning.     Probiotic Product (PROBIOTIC DAILY PO) Take 1 capsule by mouth every evening.     rivaroxaban (XARELTO) 20 MG TABS tablet Take 1 tablet (20 mg total) by mouth daily with supper. 90 tablet 3   rosuvastatin (CRESTOR) 40 MG tablet Take 1 tablet (40 mg total) by mouth every evening. 90 tablet 2   SYMBICORT 160-4.5 MCG/ACT  inhaler INHALE 2 PUFFS INTO THE LUNGS TWICE DAILY 10.2 g 0   triamcinolone (KENALOG) 0.1 % Apply 1 application topically 2 (two) times daily as needed (skin irritation).     VENTOLIN HFA 108 (90 Base) MCG/ACT inhaler INHALE 1 TO 2 PUFFS INTO THE LUNGS EVERY 4 HOURS AS NEEDED FOR WHEEZING OR SHORTNESS OF BREATH 18 g 5   Wheat Dextrin (BENEFIBER PO) Take 2 Scoops by mouth 2 (two) times daily.     No current facility-administered medications for this visit.    Allergies:   Sulfa antibiotics   Social History:  The patient  reports that he quit smoking about 40 years ago. His smoking use included cigarettes. He has a 30.00 pack-year smoking history. He has never used smokeless tobacco. He reports that he does not drink alcohol and does not use drugs.   Family History:  The patient's family history includes Breast cancer in his maternal grandmother; Coronary artery disease in his father; Gallstones in his mother; Goiter in his paternal grandmother; Heart attack in his father; Heart disease in his mother; Hypertension in his sister; Nephrolithiasis in his father and son; Other in his mother.  ROS:  Please see the history of present illness.    All other systems are reviewed and otherwise negative.   PHYSICAL EXAM:  VS:  There were no vitals taken for this visit. BMI: There is no height or weight on file to calculate BMI. Well nourished, well developed, in no acute distress HEENT: normocephalic, atraumatic Neck: no JVD, carotid bruits or masses Cardiac:  *** RRR (paced); no significant murmurs, no rubs, or gallops Lungs:  *** CTA b/l, no wheezing, rhonchi or rales Abd: soft, nontender MS: no deformity or atrophy Ext:  *** no edema Skin: warm and dry, no rash Neuro:  No gross deficits appreciated Psych: euthymic mood, full affect  *** ICD site is stable, no tethering or discomfort   EKG:  not done today  Device interrogation done today and reviewed by myself:  ***  07/02/2019:  TTE IMPRESSIONS  1. Left ventricular ejection fraction, by estimation, is 30 to 35%. The  left ventricle has moderately decreased function. The left ventricle  demonstrates regional wall motion abnormalities (see scoring  diagram/findings for description). The left  ventricular internal cavity size was moderately dilated. Left ventricular  diastolic parameters are consistent with Grade I diastolic dysfunction  (impaired relaxation).   2. Right ventricular systolic function is normal. The right ventricular  size is normal. There is normal pulmonary artery systolic pressure.   3. Left atrial size was moderately dilated.   4. The mitral valve is normal in structure. Mild mitral valve  regurgitation. No evidence of mitral stenosis.   5. The aortic valve is normal in structure. Aortic valve regurgitation is  not visualized. No aortic stenosis is present.   6. The inferior vena cava is normal in size with greater than 50%  respiratory variability, suggesting right atrial pressure of 3 mmHg.   Comparison(s): EF 20-25%.    12/27/2011 This demonstrates successful extraction of an old left ventricular lead, which had retracted back into the subclavian vein.  It demonstrates successful insertion of a new left ventricular lead into the lateral vein of the left ventricle and successful pocket revision and contrast venography all without immediate procedure complication.   Recent Labs: 05/17/2020: ALT 21; BUN 16; Creatinine, Ser 0.93; Hemoglobin 14.9; Platelets 286.0; Potassium 4.3; Sodium 137; TSH 2.01  05/17/2020: Cholesterol 135; HDL 32.50; LDL Cholesterol 81; Total CHOL/HDL Ratio 4; Triglycerides 105.0; VLDL 21.0   CrCl cannot be calculated (Patient's most recent lab result is older than the maximum 21 days allowed.).   Wt Readings from Last 3 Encounters:  06/28/20 209 lb (94.8 kg)  05/24/20 209 lb (94.8 kg)  05/17/20 210 lb 6.4 oz (95.4 kg)     Other studies reviewed: Additional  studies/records reviewed today include: summarized above  ASSESSMENT AND PLAN:  1. CRT-D     ***  2. CAD     *** No anginal symptoms     *** On BB, statin, no ASA w/xarelto     C/w Dr. Stanford Breed  3. Chronic CHF 4. Mixed ischemic/NICM     No symptoms or exam findings to suggest folume OL     On BB, entresto, lasix, Inspra, K+  5. Permanent Afib     CHA2DS2Vasc is 5, on *** xarelto, appropriately dosed      ***  7. VT     *** On mexiletine     *** None noted  8. HTN     *** Looks good    Disposition: ***  Current medicines are reviewed at length with the patient today.  The patient did not have any concerns regarding medicines.  Venetia Night, PA-C 07/09/2020 7:57 AM     [facility] [address] [place] [address] [phone] (office)  [phone] (fax)

## 2020-07-10 ENCOUNTER — Encounter: Payer: Medicare Other | Admitting: Physician Assistant

## 2020-07-10 ENCOUNTER — Telehealth: Payer: Self-pay | Admitting: *Deleted

## 2020-07-10 NOTE — Telephone Encounter (Signed)
Spoke with patient and feeling okay no chest pain shortness of breath  and will follow  in a year Dr Caryl Comes in a year and Stanford Breed in December

## 2020-07-12 ENCOUNTER — Other Ambulatory Visit: Payer: Self-pay | Admitting: Emergency Medicine

## 2020-07-22 ENCOUNTER — Other Ambulatory Visit: Payer: Self-pay | Admitting: Cardiology

## 2020-07-24 ENCOUNTER — Other Ambulatory Visit: Payer: Self-pay

## 2020-07-24 DIAGNOSIS — I4891 Unspecified atrial fibrillation: Secondary | ICD-10-CM

## 2020-07-24 DIAGNOSIS — I429 Cardiomyopathy, unspecified: Secondary | ICD-10-CM

## 2020-07-24 DIAGNOSIS — I480 Paroxysmal atrial fibrillation: Secondary | ICD-10-CM

## 2020-07-24 DIAGNOSIS — I059 Rheumatic mitral valve disease, unspecified: Secondary | ICD-10-CM

## 2020-07-24 DIAGNOSIS — Z95810 Presence of automatic (implantable) cardiac defibrillator: Secondary | ICD-10-CM

## 2020-07-24 MED ORDER — EPLERENONE 25 MG PO TABS: 25.0000 mg | ORAL_TABLET | Freq: Every day | ORAL | 11 refills | Status: DC

## 2020-07-24 NOTE — Telephone Encounter (Signed)
72m 94.8kg, scr 0.93 05/17/20, lovw/crenshaw 06/28/20, ccr 87.8

## 2020-07-26 ENCOUNTER — Other Ambulatory Visit: Payer: Self-pay | Admitting: Emergency Medicine

## 2020-08-14 ENCOUNTER — Other Ambulatory Visit: Payer: Self-pay | Admitting: Emergency Medicine

## 2020-08-18 ENCOUNTER — Ambulatory Visit (INDEPENDENT_AMBULATORY_CARE_PROVIDER_SITE_OTHER): Payer: Medicare Other

## 2020-08-18 DIAGNOSIS — I4729 Other ventricular tachycardia: Secondary | ICD-10-CM

## 2020-08-18 DIAGNOSIS — I472 Ventricular tachycardia: Secondary | ICD-10-CM

## 2020-08-21 LAB — CUP PACEART REMOTE DEVICE CHECK
Battery Remaining Longevity: 102 mo
Battery Remaining Percentage: 100 %
Brady Statistic RA Percent Paced: 0 %
Brady Statistic RV Percent Paced: 96 %
Date Time Interrogation Session: 20220805012100
HighPow Impedance: 43 Ohm
Implantable Lead Implant Date: 20020527
Implantable Lead Implant Date: 20020527
Implantable Lead Implant Date: 20131212
Implantable Lead Location: 753858
Implantable Lead Location: 753859
Implantable Lead Location: 753860
Implantable Lead Model: 148
Implantable Lead Model: 4194
Implantable Lead Model: 6940
Implantable Lead Serial Number: 120244
Implantable Pulse Generator Implant Date: 20220131
Lead Channel Impedance Value: 538 Ohm
Lead Channel Impedance Value: 649 Ohm
Lead Channel Impedance Value: 837 Ohm
Lead Channel Setting Pacing Amplitude: 2 V
Lead Channel Setting Pacing Amplitude: 2.5 V
Lead Channel Setting Pacing Amplitude: 2.5 V
Lead Channel Setting Pacing Pulse Width: 0.4 ms
Lead Channel Setting Pacing Pulse Width: 1 ms
Lead Channel Setting Sensing Sensitivity: 0.6 mV
Lead Channel Setting Sensing Sensitivity: 1 mV
Pulse Gen Serial Number: 149562

## 2020-08-23 ENCOUNTER — Telehealth: Payer: Self-pay | Admitting: Emergency Medicine

## 2020-08-23 DIAGNOSIS — J849 Interstitial pulmonary disease, unspecified: Secondary | ICD-10-CM

## 2020-08-23 NOTE — Telephone Encounter (Signed)
Pt is due for a CT scan upon seeing Dr. Lamonte Sakai on 09/27/2020. Pt is requesting for the CT to be done at the [place] location if possible. Will route to [facility] pool; thanks!   Pls regard; [phone]

## 2020-08-23 NOTE — Telephone Encounter (Signed)
I do not see any orders for a Ct in Sept

## 2020-08-26 ENCOUNTER — Other Ambulatory Visit: Payer: Self-pay | Admitting: *Deleted

## 2020-08-26 MED ORDER — IPRATROPIUM BROMIDE 0.06 % NA SOLN
2.0000 | Freq: Three times a day (TID) | NASAL | 1 refills | Status: DC
Start: 2020-08-26 — End: 2022-12-01

## 2020-09-04 ENCOUNTER — Telehealth: Payer: Self-pay

## 2020-09-04 NOTE — Progress Notes (Signed)
Chronic Care Management Pharmacy Assistant   Name: [name]  MRN: [id number] DOB: 02-Dec-1940   Reason for Encounter: Disease State - General Adherence - Scheduled CPP visit (Oct/ Nov)    Recent office visits:  05/17/20 Joel Asa, MD (PCP) - Family Medicine - Labs were ordered. No medication changes. Follow up in 6 months.    Recent consult visits:  06/28/20 Joel Ruths, MD - Cardiology - Ischemic Cardiomyopathy - No medication changes. Follow up in 6 months.   05/24/20 Joel Axe, MD - Cardiology - Internal hemorrhoid - EKG performed in office. No medication changes. Follow in 9 months.   04/13/20 Joel Axe, MD - Cardiology - Paroxysmal Artrial Fibrillation - Labs ordered. No medication changes. Follow up in 2-3 months.   Hospital visits:  Medication Reconciliation was completed by comparing discharge summary, patient's EMR and Pharmacy list, and upon discussion with patient.  Admitted to the hospital on 05/10/20 due to Cardioversion. Discharge date was 05/10/20. Discharged from [place]?Medications Started at [facility] Discharge:?? No new medications started.  Medication Changes at Hospital Discharge: No medication changes noted.   Medications Discontinued at Hospital Discharge: No medications were discontinued at discharge.   Medications that remain the same after Hospital Discharge:??  All other medications will remain the same.    Medications: Outpatient Encounter Medications as of 09/04/2020  Medication Sig Note   rivaroxaban (XARELTO) 20 MG TABS tablet TAKE 1 TABLET(20 MG) BY MOUTH DAILY WITH SUPPER    antiseptic oral rinse (BIOTENE) LIQD 15 mLs by Mouth Rinse route in the morning and at bedtime.    budesonide-formoterol (SYMBICORT) 160-4.5 MCG/ACT inhaler INHALE 2 PUFFS INTO THE LUNGS TWICE DAILY    carvedilol (COREG) 6.25 MG tablet TAKE 1 TABLET(6.25 MG) BY MOUTH TWICE DAILY WITH A MEAL    cholecalciferol (VITAMIN D) 1000 units  tablet Take 1,000 Units by mouth every evening.    ENTRESTO 24-26 MG TAKE 1 TABLET BY MOUTH TWICE DAILY    eplerenone (INSPRA) 25 MG tablet Take 1 tablet (25 mg total) by mouth daily.    fluticasone (FLONASE) 50 MCG/ACT nasal spray SHAKE LIQUID AND USE 2 SPRAYS IN EACH NOSTRIL TWICE DAILY    furosemide (LASIX) 40 MG tablet TAKE 2 TABLETS BY MOUTH EVERY DAY    ipratropium (ATROVENT) 0.06 % nasal spray Place 2 sprays into both nostrils 3 (three) times daily.    loratadine (CLARITIN) 10 MG tablet Take 10 mg by mouth in the morning.    Magnesium 250 MG TABS Take 250 mg by mouth every evening.    mexiletine (MEXITIL) 150 MG capsule TAKE 2 CAPSULES(300 MG) BY MOUTH TWICE DAILY    Multiple Vitamin (MULTIVITAMIN WITH MINERALS) TABS Take 1 tablet by mouth every evening.    nitroGLYCERIN (NITROSTAT) 0.4 MG SL tablet DISSOLVE 1 TABLET UNDER THE TONGUE EVERY 5 MINUTES AS NEEDED FOR CHEST PAIN (Patient taking differently: Place 0.4 mg under the tongue every 5 (five) minutes x 3 doses as needed for chest pain.) 02/14/2020: Hasnt used in 10 years   polyvinyl alcohol-povidone (HYPOTEARS) 1.4-0.6 % ophthalmic solution Place 1-2 drops into both eyes in the morning and at bedtime.    potassium chloride SA (KLOR-CON) 20 MEQ tablet Take 1 tablet (20 mEq total) by mouth daily with lunch.    prednisoLONE acetate (PRED FORTE) 1 % ophthalmic suspension Place 1 drop into the left eye in the morning.    Probiotic Product (PROBIOTIC DAILY PO) Take 1 capsule  by mouth every evening.    rosuvastatin (CRESTOR) 40 MG tablet Take 1 tablet (40 mg total) by mouth every evening.    triamcinolone (KENALOG) 0.1 % Apply 1 application topically 2 (two) times daily as needed (skin irritation).    VENTOLIN HFA 108 (90 Base) MCG/ACT inhaler INHALE 1 TO 2 PUFFS INTO THE LUNGS EVERY 4 HOURS AS NEEDED FOR WHEEZING OR SHORTNESS OF BREATH    Wheat Dextrin (BENEFIBER PO) Take 2 Scoops by mouth 2 (two) times daily.    No facility-administered  encounter medications on file as of 09/04/2020.    Have you had any problems recently with your health? Patient denied any problems with health recently. States he is doing great.   Have you had any problems with your pharmacy? Patient denied having any problems with his current pharmacy.  What issues or side effects are you having with your medications? Patient denied any side effects or issues with current medications.  What would you like me to pass along to [name], CPP for them to help you with?  Patient did not have anything he wanted to pass along to CPP at this time.   What can we do to take care of you better? Patient did not have any suggestions. He stated he was very happy with how well we are taking care of his healthcare needs.   Star Rating Drugs: Rosuvastatin (CRESTOR) 40 MG tablet - last filled 06/15/20 90 days    Future Appointments  Date Time Provider [place]  09/22/2020 11:00 AM [facility]-CT 1 [facility]-CT [facility]  09/27/2020 10:00 AM [facility]-PFT RM [facility]PULCARE None  09/27/2020 11:00 AM Collene Gobble, MD [facility]-PULCARE None  11/06/2020  2:15 PM [facility] HEALTH COACH [facility] PEC  11/16/2020 10:00 AM [name], MD [facility] PEC  11/17/2020  7:00 AM [facility] DEVICE REMOTES [facility]  12/27/2020 10:40 AM Stanford Breed, Denice Bors, MD [facility]IGH[facility] None  02/16/2021  7:00 AM [facility] DEVICE REMOTES [facility]  05/18/2021  7:00 AM [facility] DEVICE REMOTES [facility]  08/17/2021  7:00 AM [facility] DEVICE REMOTES [facility]   Patient scheduled for Follow up CPP visit for : 10/25/20 @ 3:30 pm. Patient confirmed appointment date and time.    [name], [place] Pharmacist Assistant  [phone]  Time Spent: 40 minutes

## 2020-09-07 NOTE — Progress Notes (Signed)
Remote ICD transmission.   

## 2020-09-22 ENCOUNTER — Other Ambulatory Visit: Payer: Self-pay

## 2020-09-22 ENCOUNTER — Ambulatory Visit (HOSPITAL_BASED_OUTPATIENT_CLINIC_OR_DEPARTMENT_OTHER)
Admission: RE | Admit: 2020-09-22 | Discharge: 2020-09-22 | Disposition: A | Discharge status: Home / Self Care | Payer: Medicare Other | Source: Ambulatory Visit | Attending: Emergency Medicine | Admitting: Emergency Medicine

## 2020-09-22 DIAGNOSIS — I700 Atherosclerosis of aorta: Secondary | ICD-10-CM | POA: Diagnosis not present

## 2020-09-22 DIAGNOSIS — J479 Bronchiectasis, uncomplicated: Secondary | ICD-10-CM | POA: Diagnosis not present

## 2020-09-22 DIAGNOSIS — J849 Interstitial pulmonary disease, unspecified: Secondary | ICD-10-CM

## 2020-09-23 ENCOUNTER — Other Ambulatory Visit: Payer: Self-pay | Admitting: Emergency Medicine

## 2020-09-25 ENCOUNTER — Other Ambulatory Visit: Payer: Self-pay | Admitting: *Deleted

## 2020-09-25 DIAGNOSIS — H9113 Presbycusis, bilateral: Secondary | ICD-10-CM | POA: Diagnosis not present

## 2020-09-25 DIAGNOSIS — J849 Interstitial pulmonary disease, unspecified: Secondary | ICD-10-CM

## 2020-09-25 DIAGNOSIS — H903 Sensorineural hearing loss, bilateral: Secondary | ICD-10-CM | POA: Diagnosis not present

## 2020-09-25 DIAGNOSIS — J300 Vasomotor rhinitis: Secondary | ICD-10-CM | POA: Diagnosis not present

## 2020-09-27 ENCOUNTER — Encounter: Payer: Self-pay | Admitting: Emergency Medicine

## 2020-09-27 ENCOUNTER — Ambulatory Visit (INDEPENDENT_AMBULATORY_CARE_PROVIDER_SITE_OTHER): Payer: Medicare Other | Admitting: Emergency Medicine

## 2020-09-27 ENCOUNTER — Other Ambulatory Visit: Payer: Self-pay

## 2020-09-27 VITALS — BP 117/74 | HR 70 | Temp 97.5°F | Ht 72.0 in | Wt 210.0 lb

## 2020-09-27 DIAGNOSIS — I255 Ischemic cardiomyopathy: Secondary | ICD-10-CM | POA: Diagnosis not present

## 2020-09-27 DIAGNOSIS — J849 Interstitial pulmonary disease, unspecified: Secondary | ICD-10-CM | POA: Diagnosis not present

## 2020-09-27 DIAGNOSIS — J441 Chronic obstructive pulmonary disease with (acute) exacerbation: Secondary | ICD-10-CM

## 2020-09-27 LAB — PULMONARY FUNCTION TEST
DL/VA % pred: 101 %
DL/VA: 3.95 ml/min/mmHg/L
DLCO cor % pred: 57 %
DLCO cor: 14.62 ml/min/mmHg
DLCO unc % pred: 57 %
DLCO unc: 14.62 ml/min/mmHg
FEF 25-75 Post: 2.54 L/sec
FEF 25-75 Pre: 2.49 L/sec
FEF2575-%Change-Post: 2 %
FEF2575-%Pred-Post: 119 %
FEF2575-%Pred-Pre: 117 %
FEV1-%Change-Post: 1 %
FEV1-%Pred-Post: 66 %
FEV1-%Pred-Pre: 65 %
FEV1-Post: 2 L
FEV1-Pre: 1.98 L
FEV1FVC-%Change-Post: -2 %
FEV1FVC-%Pred-Pre: 117 %
FEV6-%Change-Post: 3 %
FEV6-%Pred-Post: 61 %
FEV6-%Pred-Pre: 59 %
FEV6-Post: 2.44 L
FEV6-Pre: 2.34 L
FEV6FVC-%Pred-Post: 106 %
FEV6FVC-%Pred-Pre: 106 %
FVC-%Change-Post: 3 %
FVC-%Pred-Post: 57 %
FVC-%Pred-Pre: 55 %
FVC-Post: 2.44 L
FVC-Pre: 2.34 L
Post FEV1/FVC ratio: 82 %
Post FEV6/FVC ratio: 100 %
Pre FEV1/FVC ratio: 84 %
Pre FEV6/FVC Ratio: 100 %
RV % pred: 46 %
RV: 1.26 L
TLC % pred: 57 %
TLC: 4.17 L

## 2020-09-27 MED ORDER — BREZTRI AEROSPHERE 160-9-4.8 MCG/ACT IN AERO: 2.0000 | INHALATION_SPRAY | Freq: Two times a day (BID) | RESPIRATORY_TRACT | 0 refills | Status: DC

## 2020-09-27 MED ORDER — LEVALBUTEROL TARTRATE 45 MCG/ACT IN AERO: 2.0000 | INHALATION_SPRAY | RESPIRATORY_TRACT | 12 refills | Status: DC | PRN

## 2020-09-27 NOTE — Progress Notes (Signed)
Subjective:    Patient ID: [name], male    DOB: [birth year], 80 y.o.   MRN: [id number]  HPI      ROV 02/19/18 --[name] who follows up today for mixed obstruction and restriction, COPD as well as stable bilateral lower lobe interstitial disease.  We questioned possible amiodarone toxicity and this was stopped.  He had a repeat high-resolution CT scan of the chest on 02/17/2018 that I reviewed.  This shows some mild emphysematous change irregular peripheral interstitial disease with some mild associated bronchiectasis, no significant honeycomb change.  Unchanged compared with his prior 1 year ago.  Pulmonary function testing done on 02/09/2018 shows mild mixed restriction and obstruction, overall stable compared with his prior 1 year ago.        ROV 04/23/19 --[name] is 24 and has a history of mixed obstruction and restriction on pulmonary function testing, COPD as well as stable bilateral lower lobe interstitial disease.  His last CT scan was 02/17/2018 (stable).  He was previously on amiodarone, this has been stopped for several years.  Currently managed on Symbicort, is using albuterol very rarely He reports that he is having some imbalance, feels unstable w ambulation, has had some falls.  He is having more cough, mucous production over the last several months. He has a lot of nasal gtt - uses Atrovent NS with improvement. He is on flonase daily, loratadine.   Chest x-ray from 04/15/2019 reviewed by me, shows persistent interstitial markings particularly in the right midlung. No significant change or increase noted when comparing with prior films.   ROV 09/27/20 --follow-up visit [name] whom we have followed for mixed obstruction and restriction due to COPD and also bilateral lower lobe interstitial disease question amiodarone toxicity.  Autoimmune panel in October 2017 was negative.  He is now in atrial fibrillation almost all the time, notices that his dyspnea is associated  with tachycardia.  Currently managed on Symbicort, albuterol which he is using 2-3x a month.  He is having a lot of cough and nasal congestion / drainage. He wonders if the flonase is part of the problem, was told to do atrovent up to tid to see if he gets benefit.   Most recent CT chest performed 09/22/2020 reviewed by me, shows persistent patchy groundglass attenuation and septal thickening in the subpleural regions without any definite honeycomb, possibly slightly progressed compared with 02/17/2018.  Pulmonary function testing performed today and reviewed by me, showed mixed restriction and obstruction with an FEV1 of 1.9 865% predicted and no bronchodilator response.  Significant decrease in FEV1 compared with 02/09/2018.  Restricted lung volumes, decreased diffusion capacity that does correct when adjusted for his alveolar volume.                                                                                                   Review of Systems As per HPI      Objective:   Physical Exam Vitals:   09/27/20 1118  BP: 117/74  Pulse: 70  Temp: (!) 97.5 F (36.4 C)  TempSrc: Oral  SpO2: 97%  Weight: 210 lb (95.3 kg)  Height: 6' (1.829 m)   Gen: Pleasant, well-nourished, in no distress,  normal affect  ENT: No lesions,  mouth clear,  oropharynx clear, no postnasal drip  Neck: No JVD, no stridor  Lungs: No use of accessory muscles, right > left basilar inspiratory crackles, no wheezing  Cardiovascular: RRR, heart sounds normal, no murmur or gallops, no peripheral edema  Musculoskeletal: No deformities, no cyanosis or clubbing  Neuro: alert, non focal  Skin: Warm, no lesions or rashes      Assessment & Plan:  Interstitial lung disease ([place]) Presumed due to his prior amiodarone use.  Autoimmune testing was negative in 10/2015.  That said he has progressive restriction on his pulmonary function testing, probably some mild progression of peripheral ILD on his high-resolution CT  chest that was done 09/22/2020.  Discussed this with him.  I think will be reasonable to repeat his autoimmune labs, have him see the ILD clinic for their evaluation of the CT.  He may want to pursue antifibrotic therapy.  I think he should at least discuss with them.  COPD (chronic obstructive pulmonary disease) ([place]) With his progressive dyspnea think will be reasonable to try changing his Symbicort to [facility] to see if he gets more benefit.  If so we will continue it.  He is now in atrial fibrillation almost all the time and asked today about whether there would be benefit to changing his albuterol to Xopenex as needed.  I am okay with making this change.  Baltazar Apo, MD, PhD 09/27/2020, 1:54 PM [facility] [phone] or if no answer [phone]

## 2020-09-27 NOTE — Assessment & Plan Note (Signed)
Presumed due to his prior amiodarone use.  Autoimmune testing was negative in 10/2015.  That said he has progressive restriction on his pulmonary function testing, probably some mild progression of peripheral ILD on his high-resolution CT chest that was done 09/22/2020.  Discussed this with him.  I think will be reasonable to repeat his autoimmune labs, have him see the ILD clinic for their evaluation of the CT.  He may want to pursue antifibrotic therapy.  I think he should at least discuss with them.

## 2020-09-27 NOTE — Assessment & Plan Note (Signed)
With his progressive dyspnea think will be reasonable to try changing his Symbicort to [facility] to see if he gets more benefit.  If so we will continue it.  He is now in atrial fibrillation almost all the time and asked today about whether there would be benefit to changing his albuterol to Xopenex as needed.  I am okay with making this change.

## 2020-09-27 NOTE — Progress Notes (Signed)
Full PFT performed today.

## 2020-09-27 NOTE — Patient Instructions (Addendum)
We will stop Symbicort Try starting Breztri 2 puffs twice a day to see if you get more benefit.  Rinse and gargle after using.  If the new medication helps your breathing please call our office and we will send a prescription to your pharmacy. Stop albuterol Start xopenex, you can use 2 puffs up to every 4 hours if needed for shortness of breath, chest tightness, wheezing. Lab work today We will refer you to see our Interstitial Lung Disease Clinic to discuss your CT scan of the chest Stop flonase OK to use your ipratropium nasal spray up to three times a day when you need it   Follow with Dr Lamonte Sakai in 3 months or sooner if you have any problems.

## 2020-09-27 NOTE — Patient Instructions (Signed)
Full PFT performed today.

## 2020-09-28 LAB — ANCA SCREEN W REFLEX TITER: ANCA Screen: NEGATIVE

## 2020-09-28 LAB — SJOGREN'S SYNDROME ANTIBODS(SSA + SSB)
SSA (Ro) (ENA) Antibody, IgG: <1 AI
SSB (La) (ENA) Antibody, IgG: <1 AI

## 2020-09-28 LAB — RHEUMATOID FACTOR: Rheumatoid fact SerPl-aCnc: <14 IU/mL (ref <14)

## 2020-09-28 LAB — ALDOLASE: Aldolase: 3 U/L (ref <=8.1)

## 2020-09-28 LAB — JO-1 ANTIBODY-IGG: Jo-1 Autoabs: <1 AI

## 2020-09-29 ENCOUNTER — Telehealth: Payer: Self-pay | Admitting: Emergency Medicine

## 2020-09-29 DIAGNOSIS — Z23 Encounter for immunization: Secondary | ICD-10-CM | POA: Diagnosis not present

## 2020-09-29 LAB — ANA,IFA RA DIAG PNL W/RFLX TIT/PATN
Anti Nuclear Antibody (ANA): NEGATIVE
Cyclic Citrullin Peptide Ab: <16 UNITS
Rheumatoid fact SerPl-aCnc: <14 IU/mL (ref <14)

## 2020-09-29 NOTE — Telephone Encounter (Signed)
PA request was received from (pharmacy): [facility] in [place] Phone: Fax:  Medication name and strength: Engineer, manufacturing: RB  Was PA started with CMM?: Yes If yes, please enter KEY: [id number] Medication tried and failed: N./A Covered Alternatives: N/A  PA sent to plan, time frame for approval / denial: 3 days Routing to [place] for follow-up.

## 2020-10-02 LAB — HYPERSENSITIVITY PNEUMONITIS
A. Pullulans Abs: NEGATIVE
A.Fumigatus #1 Abs: NEGATIVE
Micropolyspora faeni, IgG: NEGATIVE
Pigeon Serum Abs: NEGATIVE
Thermoact. Saccharii: NEGATIVE
Thermoactinomyces vulgaris, IgG: NEGATIVE

## 2020-10-02 MED ORDER — XOPENEX HFA 45 MCG/ACT IN AERO: 1.0000 | INHALATION_SPRAY | RESPIRATORY_TRACT | 11 refills | Status: DC | PRN

## 2020-10-02 NOTE — Telephone Encounter (Signed)
Called and spoke with [facility]  Was advised that brand xopenex hfa is covered 1 inh per month  The generic is what needs a  PA I have sent new rx for brand name only to pharm  Nothing further needed and I spoke with the pt and made him aware this was done

## 2020-10-05 ENCOUNTER — Other Ambulatory Visit: Payer: Self-pay

## 2020-10-05 ENCOUNTER — Ambulatory Visit (INDEPENDENT_AMBULATORY_CARE_PROVIDER_SITE_OTHER): Payer: Medicare Other | Admitting: Internal Medicine

## 2020-10-05 ENCOUNTER — Encounter: Payer: Self-pay | Admitting: Internal Medicine

## 2020-10-05 VITALS — BP 126/70 | HR 70 | Temp 97.5°F | Ht 72.0 in | Wt 208.2 lb

## 2020-10-05 DIAGNOSIS — J849 Interstitial pulmonary disease, unspecified: Secondary | ICD-10-CM

## 2020-10-05 DIAGNOSIS — Z23 Encounter for immunization: Secondary | ICD-10-CM | POA: Diagnosis not present

## 2020-10-05 DIAGNOSIS — J8410 Pulmonary fibrosis, unspecified: Secondary | ICD-10-CM | POA: Diagnosis not present

## 2020-10-05 DIAGNOSIS — R0982 Postnasal drip: Secondary | ICD-10-CM

## 2020-10-05 DIAGNOSIS — J84112 Idiopathic pulmonary fibrosis: Secondary | ICD-10-CM | POA: Diagnosis not present

## 2020-10-05 DIAGNOSIS — R059 Cough, unspecified: Secondary | ICD-10-CM | POA: Diagnosis not present

## 2020-10-05 DIAGNOSIS — J449 Chronic obstructive pulmonary disease, unspecified: Secondary | ICD-10-CM | POA: Diagnosis not present

## 2020-10-05 DIAGNOSIS — R0600 Dyspnea, unspecified: Secondary | ICD-10-CM | POA: Diagnosis not present

## 2020-10-05 DIAGNOSIS — J439 Emphysema, unspecified: Secondary | ICD-10-CM

## 2020-10-05 DIAGNOSIS — I255 Ischemic cardiomyopathy: Secondary | ICD-10-CM

## 2020-10-05 DIAGNOSIS — R0609 Other forms of dyspnea: Secondary | ICD-10-CM

## 2020-10-05 NOTE — Progress Notes (Signed)
ROV 02/19/18 --80 year old man who follows up today for mixed obstruction and restriction, COPD as well as stable bilateral lower lobe interstitial disease.  We questioned possible amiodarone toxicity and this was stopped.  He had a repeat high-resolution CT scan of the chest on 02/17/2018 that I reviewed.  This shows some mild emphysematous change irregular peripheral interstitial disease with some mild associated bronchiectasis, no significant honeycomb change.  Unchanged compared with his prior 1 year ago.  Pulmonary function testing done on 02/09/2018 shows mild mixed restriction and obstruction, overall stable compared with his prior 1 year ago.        ROV 04/23/19 --[name] is 80 and has a history of mixed obstruction and restriction on pulmonary function testing, COPD as well as stable bilateral lower lobe interstitial disease.  His last CT scan was 02/17/2018 (stable).  He was previously on amiodarone, this has been stopped for several years.  Currently managed on Symbicort, is using albuterol very rarely He reports that he is having some imbalance, feels unstable w ambulation, has had some falls.  He is having more cough, mucous production over the last several months. He has a lot of nasal gtt - uses Atrovent NS with improvement. He is on flonase daily, loratadine.   Chest x-ray from 04/15/2019 reviewed by me, shows persistent interstitial markings particularly in the right midlung. No significant change or increase noted when comparing with prior films.   ROV 09/27/20 --follow-up visit 80 year old gentleman whom we have followed for mixed obstruction and restriction due to COPD and also bilateral lower lobe interstitial disease question amiodarone toxicity.  Autoimmune panel in October 2017 was negative.  He is now in atrial fibrillation almost all the time, notices that his dyspnea is associated with tachycardia.  Currently managed on Symbicort, albuterol which he is using 2-3x a month.  He  is having a lot of cough and nasal congestion / drainage. He wonders if the flonase is part of the problem, was told to do atrovent up to tid to see if he gets benefit.   Most recent CT chest performed 09/22/2020 reviewed by me, shows persistent patchy groundglass attenuation and septal thickening in the subpleural regions without any definite honeycomb, possibly slightly progressed compared with 02/17/2018.  Pulmonary function testing performed today and reviewed by me, showed mixed restriction and obstruction with an FEV1 of 1.9 865% predicted and no bronchodilator response.  Significant decrease in FEV1 compared with 02/09/2018.  Restricted lung volumes, decreased diffusion capacity that does correct when adjusted for his alveolar volume.  OV 10/05/2020 -transfer of care to the ILD center with Dr. Chase Caller.  Patient being referred by Dr. Baltazar Apo  Subjective:  Patient ID: [name], male , DOB: 1940-11-06 , age 80 y.o. , MRN: [id number] , ADDRESS: [place] [address] PCP [name], MD Patient Care Team: [name], MD as PCP - General (Family Medicine) Festus Aloe, MD as Consulting Physician (Urology) Michael Boston, MD as Consulting Physician (General Surgery) Stanford Breed, Denice Bors, MD as Consulting Physician (Cardiology) Jodi Marble, MD as Consulting Physician (Otolaryngology) Deboraha Sprang, MD as Consulting Physician (Cardiology) Luberta Mutter, MD as Consulting Physician (Ophthalmology) Lamonte Sakai Rose Fillers, MD as Consulting Physician (Pulmonary Disease) Mauri Pole, MD as Consulting Physician (Gastroenterology) [name], [facility] as Pharmacist (Pharmacist)  This Provider for this visit: Treatment Team:  Attending Provider: Brand Males, MD    10/05/2020 -   Chief Complaint  Patient presents with   Follow-up    Pt states that he does have complaints of SOB that can happen at any time but is worse with exertion.      HPI [name] 80 y.o. -referred by Dr. Baltazar Apo.  History is provided by the patient and also review of the medical records.  Patient tells me he is aware that he has ILD.  He has a significant symptom burden.  He says he is recently been told his ILD is gotten worse.  He was on amiodarone for many years.  Then approximately 18 months ago he stopped it because he got worried about the drug.  Dr. Lamonte Sakai notes indicate that patient has combined emphysema and interstitial lung disease.  His pulmonary function test profile shows 20% decline in FVC in the last 5 years or so with most of it coming in the last 2 years.  High-resolution CT chest only shows some decline.  High-resolution CT chest personally visualized by me and is probable UIP.  His serologies are negative.  He is a former smoker having quit over 40 years ago.  He is Caucasian and his age is greater than 35.  Male gender.  He definitely has progressive symptoms.  He really wants a good symptom control.  He also wants understand his disease.  Today most of the focus was spent on helping him understand interstitial lung disease.  I explicitly told him that I would not be able to address symptom burden today.  He will also switch his comprehensive pulmonary care to see Dr. Chase Caller myself.  He has had an MI in the past.  His atrial fibrillation is on Eliquis.  He is no longer on amiodarone.  His walking desaturation test did not show desaturation more than 3 points.   CT Chest data Sept 2022  Narrative & Impression  CLINICAL DATA:  80 year old male with history of COPD. Evaluate for interstitial lung disease.   EXAM: CT CHEST WITHOUT CONTRAST   TECHNIQUE: Multidetector CT imaging of the chest was performed following the standard protocol without intravenous contrast. High resolution imaging of the lungs, as well as inspiratory and expiratory imaging, was performed.   COMPARISON:  Chest CT 02/17/2018.    FINDINGS: Cardiovascular: Heart size is normal. There is no significant pericardial fluid, thickening or pericardial calcification. There is aortic atherosclerosis, as well as atherosclerosis of the great vessels of the mediastinum and the coronary arteries, including calcified atherosclerotic plaque in the left main, left anterior descending, left circumflex and right coronary arteries. Calcifications of the aortic valve. Curvilinear calcifications associated with the myocardium of the inferior wall of the left ventricle in the mid ventricle and base, likely sequela of prior left circumflex coronary artery territory myocardial infarction(s). Left-sided biventricular pacemaker/AICD with lead tips terminating in the right atrial appendage, right ventricular apex, and overlying the lateral wall the left ventricle via the coronary sinus and coronary veins.   Mediastinum/Nodes: No pathologically enlarged mediastinal or hilar lymph nodes. Please note that accurate exclusion of hilar adenopathy is limited on noncontrast CT scans. Esophagus is unremarkable in appearance. No axillary lymphadenopathy.   Lungs/Pleura: High-resolution images again demonstrate some patchy areas of ground-glass attenuation, septal thickening, subpleural reticulation, mild traction bronchiectasis and peripheral bronchiolectasis in the lungs bilaterally. No definite honeycombing. These findings have a mild craniocaudal gradient. Inspiratory and expiratory imaging is unremarkable. No acute consolidative airspace disease. No pleural effusions. Chronic elevation of the right hemidiaphragm is again noted. No definite suspicious appearing  pulmonary nodules or masses are noted.   Upper Abdomen: Low-attenuation lesions are noted in the left lobe of the liver, incompletely characterized on today's non-contrast CT examination, but similar to the prior study and measuring up to 1.3 cm in diameter in segment 2,  statistically likely to represent cysts.   Musculoskeletal: There are no aggressive appearing lytic or blastic lesions noted in the visualized portions of the skeleton.   IMPRESSION: 1. The appearance of the lungs is compatible with interstitial lung disease, categorized as probable usual interstitial pneumonia (UIP) per current ATS guidelines. Findings appear mildly progressive compared to the prior study. 2. Aortic atherosclerosis, in addition to left main and 3 vessel coronary artery disease. Assessment for potential risk factor modification, dietary therapy or pharmacologic therapy may be warranted, if clinically indicated. 3. There are calcifications of the aortic valve. Echocardiographic correlation for evaluation of potential valvular dysfunction may be warranted if clinically indicated.   Aortic Atherosclerosis (ICD10-I70.0).     Electronically Signed   By: Vinnie Langton M.D.   On: 09/22/2020 11:28   No results found.  Simple office walk 185 feet x  3 laps goal with forehead probe 10/05/2020    O2 used ra   Number laps completed 3   Comments about pace avg   Resting Pulse Ox/HR 100% and 70/min   Final Pulse Ox/HR 97% and 82/min   Desaturated </= 88% no   Desaturated <= 3% points yes   Got Tachycardic >/= 90/min no   Symptoms at end of test Mild dyspnea   Miscellaneous comments x      PFT  PFT Results Latest Ref Rng & Units 09/27/2020 02/09/2018 03/12/2017 02/08/2015  FVC-Pre L 2.34 3.02 2.82 3.20  FVC-Predicted Pre % 55 69 64 71  FVC-Post L 2.44 2.97 2.78 3.19  FVC-Predicted Post % 57 67 63 71  Pre FEV1/FVC % % 84 82 82 82  Post FEV1/FCV % % 82 82 83 82  FEV1-Pre L 1.98 2.48 2.30 2.61  FEV1-Predicted Pre % 65 78 72 80  FEV1-Post L 2.00 2.42 2.30 2.61  DLCO uncorrected ml/min/mmHg 14.62 18.10 18.46 19.06  DLCO UNC% % 57 53 54 56  DLCO corrected ml/min/mmHg 14.62 17.90 - -  DLCO COR %Predicted % 57 53 - -  DLVA Predicted % 101 90 95 90  TLC L 4.17 4.65  4.94 4.59  TLC % Predicted % 57 64 68 63  RV % Predicted % 46 56 68 44    Results for LADARION, MUNYON (MRN [id number]) as of 10/05/2020 11:51  Ref. Range 11/14/2015 11:52 09/27/2020 12:19 09/27/2020 13:46  Anti Nuclear Antibody (ANA) Latest Ref Range: NEGATIVE  NEG  NEGATIVE  Anti JO-1 Latest Ref Range: 0.0 - 0.9 AI <4.4    Cyclic Citrullin Peptide Ab Latest Units: UNITS <16  <16  ds DNA Ab Latest Units: IU/mL 1    RA Latex Turbid. Latest Ref Range: <14 IU/mL <14 <14 <14  ENA SM Ab Ser-aCnc Latest Ref Range: <1.0 NEG AI  <1.0 NEG    ANA,IFA RA DIAG PNL W/RFLX TIT/PATN Unknown   Rpt  Ribonucleic Protein(ENA) Antibody, IgG Latest Ref Range: <1.0 NEG AI  <1.0 NEG    SSA (Ro) (ENA) Antibody, IgG Latest Ref Range: <1.0 NEG AI <1.0 NEG <1.0 NEG   SSB (La) (ENA) Antibody, IgG Latest Ref Range: <1.0 NEG AI <1.0 NEG <1.0 NEG   Scleroderma (Scl-70) (ENA) Antibody, IgG Latest Ref Range: <1.0 NEG AI  <1.0 NEG  has a past medical history of Adenomatous colon polyp (2009; 06/2012), AICD (automatic cardioverter/defibrillator) present, Atrial fibrillation ([place]), Bilateral sensorineural hearing loss, BPH (benign prostatic hypertrophy), CAD (coronary artery disease), CHF (congestive heart failure) ([place]), Chronic constipation, Chronic rhinitis, COPD (chronic obstructive pulmonary disease) ([place]), Diverticulosis of colon (2005), Gallstones (06/2014), GERD (gastroesophageal reflux disease), Hepatic steatosis (2010; 2016), Hyperlipidemia, Hypertension, Ischemic cardiomyopathy, Microscopic hematuria, Nephrolithiasis, Obstructive sleep apnea, Presence of permanent cardiac pacemaker, Squamous cell carcinoma of back, and Ventricular tachycardia ([place]).   reports that he quit smoking about 40 years ago. His smoking use included cigarettes. He has a 30.00 pack-year smoking history. He has never used smokeless tobacco.  Past Surgical History:  Procedure Laterality Date   ATRIAL ABLATION SURGERY     flutter ablation 2002    BREAST SURGERY Left 1990   "Tissue removed from breast"   CARDIAC CATHETERIZATION     CARDIOVERSION N/A 05/10/2020   Procedure: CARDIOVERSION;  Surgeon: Sanda Klein, MD;  Location: [facility];  Service: Cardiovascular;  Laterality: N/A;   carotid doppler  09/25/12   No signif stenosis   COLONOSCOPY N/A 07/13/2012   Diverticula ascending colon.  Polyp x 1-recall 5 yrs (Dr. Watt Climes) Procedure: COLONOSCOPY;  Surgeon: Jeryl Columbia, MD;  Location: [facility] ENDOSCOPY;  Service: Endoscopy;  Laterality: N/A; polypectomy x 1    COLONOSCOPY WITH PROPOFOL N/A 08/18/2015   Procedure: COLONOSCOPY WITH PROPOFOL;  Surgeon: Mauri Pole, MD;  Location: [facility] ENDOSCOPY;  Service: Endoscopy;  Laterality: N/A;   ESOPHAGOGASTRODUODENOSCOPY N/A 07/13/2012   Procedure: ESOPHAGOGASTRODUODENOSCOPY (EGD);  Surgeon: Jeryl Columbia, MD;  Location: [facility];  Service: Endoscopy;  Laterality: N/A;  NORMAL   EXTRACORPOREAL SHOCK WAVE LITHOTRIPSY     HEMORRHOID BANDING     ICD     a) Guidant Contak H170- Virl Axe, MD 07/25/06 (second device) b) Medtronic CRT-D   ICD GENERATOR CHANGEOUT N/A 02/14/2020   Procedure: ICD GENERATOR CHANGEOUT;  Surgeon: Deboraha Sprang, MD;  Location: [facility];  Service: Cardiovascular;  Laterality: N/A;   INGUINAL HERNIA REPAIR Bilateral 1975   LEAD REVISION Bilateral 12/26/2011   Procedure: LEAD REVISION;  Surgeon: Evans Lance, MD;  Location: [facility] CATH LAB;  Service: Cardiovascular;  Laterality: Bilateral;   PFTs  02/2015   Mod restriction (interstitial), mod diffusion defect (Dr. Annamaria Boots)   precancer area keratsis removed  05/2015    leftf orehead and left arm and right arm and back    Allergies  Allergen Reactions   Sulfa Antibiotics Rash    Immunization History  Administered Date(s) Administered   Fluad Quad(high Dose 65+) 09/23/2018   Influenza Split 11/11/2010, 11/02/2012, 10/14/2013   Influenza Whole 09/27/2011, 11/13/2011   Influenza, High Dose Seasonal PF  09/26/2015, 10/07/2016   Influenza, Seasonal, Injecte, Preservative Fre 09/23/2018   Influenza,inj,Quad PF,6+ Mos 11/03/2016, 09/20/2017   Influenza-Unspecified 09/26/2014, 10/15/2019   PFIZER Comirnaty(Gray Top)Covid-19 Tri-Sucrose Vaccine 04/24/2020   PFIZER(Purple Top)SARS-COV-2 Vaccination 02/02/2019, 02/22/2019, 10/26/2019, 09/29/2020   Pneumococcal Conjugate-13 06/15/2014   Pneumococcal Polysaccharide-23 03/15/2002, 07/10/2015   Td 08/27/2017   Tdap 04/04/2010   Zoster Recombinat (Shingrix) 03/29/2016   Zoster, Live 05/15/2015    Family History  Problem Relation Age of Onset   Heart disease Mother    Other Mother        colon surgery for fistula   Gallstones Mother    Breast cancer Maternal Grandmother    Coronary artery disease Father        family hx of   Heart  attack Father        4 stents/ pacemaker   Nephrolithiasis Father    Hypertension Sister    Goiter Paternal Grandmother    Nephrolithiasis Son    Colon cancer Neg Hx      Current Outpatient Medications:    antiseptic oral rinse (BIOTENE) LIQD, 15 mLs by Mouth Rinse route in the morning and at bedtime., Disp: , Rfl:    Budeson-Glycopyrrol-Formoterol (BREZTRI AEROSPHERE) 160-9-4.8 MCG/ACT AERO, Inhale 2 puffs into the lungs in the morning and at bedtime., Disp: 5.9 g, Rfl: 0   carvedilol (COREG) 6.25 MG tablet, TAKE 1 TABLET(6.25 MG) BY MOUTH TWICE DAILY WITH A MEAL, Disp: 180 tablet, Rfl: 3   cholecalciferol (VITAMIN D) 1000 units tablet, Take 1,000 Units by mouth every evening., Disp: , Rfl:    ENTRESTO 24-26 MG, TAKE 1 TABLET BY MOUTH TWICE DAILY, Disp: 180 tablet, Rfl: 3   eplerenone (INSPRA) 25 MG tablet, Take 1 tablet (25 mg total) by mouth daily., Disp: 30 tablet, Rfl: 11   furosemide (LASIX) 40 MG tablet, TAKE 2 TABLETS BY MOUTH EVERY DAY, Disp: 90 tablet, Rfl: 3   ipratropium (ATROVENT) 0.06 % nasal spray, Place 2 sprays into both nostrils 3 (three) times daily., Disp: 15 mL, Rfl: 1   levalbuterol  (XOPENEX HFA) 45 MCG/ACT inhaler, Inhale 2 puffs into the lungs every 4 (four) hours as needed for wheezing., Disp: 1 each, Rfl: 12   loratadine (CLARITIN) 10 MG tablet, Take 10 mg by mouth in the morning., Disp: , Rfl:    Magnesium 250 MG TABS, Take 250 mg by mouth every evening., Disp: , Rfl:    mexiletine (MEXITIL) 150 MG capsule, TAKE 2 CAPSULES(300 MG) BY MOUTH TWICE DAILY, Disp: 360 capsule, Rfl: 2   Multiple Vitamin (MULTIVITAMIN WITH MINERALS) TABS, Take 1 tablet by mouth every evening., Disp: , Rfl:    nitroGLYCERIN (NITROSTAT) 0.4 MG SL tablet, DISSOLVE 1 TABLET UNDER THE TONGUE EVERY 5 MINUTES AS NEEDED FOR CHEST PAIN (Patient taking differently: Place 0.4 mg under the tongue every 5 (five) minutes x 3 doses as needed for chest pain.), Disp: 25 tablet, Rfl: 5   polyvinyl alcohol-povidone (HYPOTEARS) 1.4-0.6 % ophthalmic solution, Place 1-2 drops into both eyes in the morning and at bedtime., Disp: , Rfl:    potassium chloride SA (KLOR-CON) 20 MEQ tablet, Take 1 tablet (20 mEq total) by mouth daily with lunch., Disp: 90 tablet, Rfl: 2   prednisoLONE acetate (PRED FORTE) 1 % ophthalmic suspension, Place 1 drop into the left eye in the morning., Disp: , Rfl:    Probiotic Product (PROBIOTIC DAILY PO), Take 1 capsule by mouth every evening., Disp: , Rfl:    rivaroxaban (XARELTO) 20 MG TABS tablet, TAKE 1 TABLET(20 MG) BY MOUTH DAILY WITH SUPPER, Disp: 90 tablet, Rfl: 1   rosuvastatin (CRESTOR) 40 MG tablet, Take 1 tablet (40 mg total) by mouth every evening., Disp: 90 tablet, Rfl: 2   triamcinolone (KENALOG) 0.1 %, Apply 1 application topically 2 (two) times daily as needed (skin irritation)., Disp: , Rfl:    Wheat Dextrin (BENEFIBER PO), Take 2 Scoops by mouth 2 (two) times daily., Disp: , Rfl:    XOPENEX HFA 45 MCG/ACT inhaler, Inhale 1-2 puffs into the lungs every 4 (four) hours as needed for wheezing., Disp: 1 each, Rfl: 11      Objective:   Vitals:   10/05/20 1131  BP: 126/70   Pulse: 70  Temp: (!) 97.5 F (36.4 C)  TempSrc: Oral  SpO2: 100%  Weight: 208 lb 3.2 oz (94.4 kg)  Height: 6' (1.829 m)    Estimated body mass index is 28.24 kg/m as calculated from the following:   Height as of this encounter: 6' (1.829 m).   Weight as of this encounter: 208 lb 3.2 oz (94.4 kg).  _0 @  Filed Weights   10/05/20 1131  Weight: 208 lb 3.2 oz (94.4 kg)     Physical Exam    General: No distress. obese Neuro: Alert and Oriented x 3. GCS 15. Speech normal Psych: Pleasant Resp:  Barrel Chest - no.  Wheeze - no, Crackles - yes at base, No overt respiratory distress CVS: Normal heart sounds. Murmurs - no Ext: Stigmata of Connective Tissue Disease - no HEENT: Normal upper airway. PEERL +. No post nasal drip        Assessment:       ICD-10-CM   1. Pulmonary emphysema with fibrosis of lung ([place])  J43.9    J84.10     2. ILD (interstitial lung disease) ([place])  J84.9     3. IPF (idiopathic pulmonary fibrosis) ([place])  J84.112     4. Chronic obstructive pulmonary disease, unspecified COPD type ([place])  J44.9     5. Cough  R05.9     6. Dyspnea on exertion  R06.00     7. Post-nasal drip  R09.82      Though he has been on amiodarone.  He he meets definition criteria for IPF based on age greater than 69, male gender, Caucasian ethnicity, progressive phenotype, probable UIP and serologies being negative.  Also former smoker.  I think amiodarone could have been a risk factor triggering worsening ILD.  Nevertheless its progressive phenotype.  Most likely this is UIP/IPF.  We discussed both nintedanib and pirfenidone as below.  Because of his previous history of MI and also being on anticoagulation ever informed him that nintedanib/Ofev is a second choice.  I recommended pirfenidone.  He is nervous about the side effects.  His wife is a Marine scientist and he wants a coming along for the next visit.  Offered pharmacy visit.  He has agreed to that he likes it.  After  that they will reconvene with me before they make a final decision  Discussed clinical trials and he is very interested but have not found him at the appropriate time.    Plan:     Patient Instructions  Pulmnary Fibrosis with emphysema ILD (interstitial lung disease) ([place]) IPF (idiopathic pulmonary fibrosis) ([facility])  -  PRovisional diagnosis is IPF - a specific varriety of pulmonary fibrosis - diagnosis being given based on the features on CT scan ["probable UIP], progression, age greater than 28, male gender, Caucasian ethnicity, former smoker and also amiodarone as a risk factor with serologies being negative  -The general nature of IPF or progressive pulmonary fibrosis is that of progression.  The natural history of IPF is that most patients will expire in few to several years  -Antifibrotic's can help slow down the progression and is strongly recommended for you  -Clinical trials is also a care option but can be discussed in the future  Plan -We discussed the 2 antifibrotic's pirfenidone/Esbriet and nintedanib/Ofev.  Between the 2 I recommend Esbriet. -Meet pharmacist [facility]iva -for face-to-face encounter to discuss Esbriet in detail [bring your wife for that visit]  -Try to meet her in the next 2 weeks -Fill out ILD questionnaire at home and bring it to see Dr.  Jerimie Mancuso when he comes next time -Return to see Dr. Chase Caller in 4 weeks - 5 weeks to discuss antifibrotic's further and also to understand the disease [bring your wife for that visit]  Chronic obstructive pulmonary disease, unspecified COPD type ([place])  - not in flare up  Plan  - cotninue breztri daily - albueo as needed  Cough Dyspnea on exertion Post nasal drip  YOu have lot of symptoms  Plan - Not addressing symptoms for the moment  - Need to get throug disease education first and management of disease - continue regular medications as of now  Flu vaccine need  Plan  - High-dose flu shot  today  Follolwup  - Less than 2 weeks with pharmacist for a face-to-face encounter -4 weeks with Dr. Chase Caller for a 30-minute face-to-face encounter   -Bring ILD packet at the time of follow-up with Dr. Chase Caller  -Bring your wife for both visits  ( Level 05 visit: Estb 40-54 min   in  visit type: on-site physical face to visit  in total care time and counseling or/and coordination of care by this undersigned MD - Dr Brand Males. This includes one or more of the following on this same day 10/05/2020: pre-charting, chart review, note writing, documentation discussion of test results, diagnostic or treatment recommendations, prognosis, risks and benefits of management options, instructions, education, compliance or risk-factor reduction. It excludes time spent by the [place] or office staff in the care of the patient. Actual time 51 min)   SIGNATURE    Dr. Brand Males, M.D., F.C.C.P,  Pulmonary and Critical Care Medicine Staff Physician, [employer] Director - Interstitial Lung Disease  Program  P[facility] [place] [facility] [place], [place], [id number]  Pager: [phone], If no answer or between  15:00h - 7:00h: call [phone] Telephone: [phone]  12:29 PM 10/05/2020

## 2020-10-05 NOTE — Patient Instructions (Addendum)
Pulmnary Fibrosis with emphysema ILD (interstitial lung disease) (HCC) IPF (idiopathic pulmonary fibrosis) (HCC)  -  PRovisional diagnosis is IPF - a specific varriety of pulmonary fibrosis - diagnosis being given based on the features on CT scan ["probable UIP], progression, age greater than 92, male gender, Caucasian ethnicity, former smoker and also amiodarone as a risk factor with serologies being negative  -The general nature of IPF or progressive pulmonary fibrosis is that of progression.  The natural history of IPF is that most patients will expire in few to several years  -Antifibrotic's can help slow down the progression and is strongly recommended for you  -Clinical trials is also a care option but can be discussed in the future  Plan -We discussed the 2 antifibrotic's pirfenidone/Esbriet and nintedanib/Ofev.  Between the 2 I recommend Esbriet. -Meet pharmacist Knox Saliva -for face-to-face encounter to discuss Esbriet in detail [bring your wife for that visit]  -Try to meet her in the next 2 weeks -Fill out ILD questionnaire at home and bring it to see Dr. Chase Caller when he comes next time -Return to see Dr. Chase Caller in 4 weeks - 5 weeks to discuss antifibrotic's further and also to understand the disease [bring your wife for that visit]  Chronic obstructive pulmonary disease, unspecified COPD type ([place])  - not in flare up  Plan  - cotninue breztri daily - albueo as needed  Cough Dyspnea on exertion Post nasal drip  YOu have lot of symptoms  Plan - Not addressing symptoms for the moment  - Need to get throug disease education first and management of disease - continue regular medications as of now  Flu vaccine need  Plan  - High-dose flu shot today  Follolwup  - Less than 2 weeks with pharmacist for a face-to-face encounter -4 weeks with Dr. Chase Caller for a 30-minute face-to-face encounter   -Bring ILD packet at the time of follow-up with Dr.  Chase Caller  -Bring your wife for both visits

## 2020-10-09 ENCOUNTER — Other Ambulatory Visit: Payer: Medicare Other | Admitting: Pharmacist

## 2020-10-09 LAB — MYOMARKER 3 PLUS PROFILE (RDL)

## 2020-10-10 ENCOUNTER — Telehealth: Payer: Self-pay | Admitting: Pharmacist

## 2020-10-10 ENCOUNTER — Other Ambulatory Visit: Payer: Self-pay

## 2020-10-10 ENCOUNTER — Ambulatory Visit: Payer: Medicare Other | Admitting: Pharmacist

## 2020-10-10 DIAGNOSIS — J849 Interstitial pulmonary disease, unspecified: Secondary | ICD-10-CM

## 2020-10-10 DIAGNOSIS — J84112 Idiopathic pulmonary fibrosis: Secondary | ICD-10-CM

## 2020-10-10 DIAGNOSIS — J449 Chronic obstructive pulmonary disease, unspecified: Secondary | ICD-10-CM

## 2020-10-10 DIAGNOSIS — Z7189 Other specified counseling: Secondary | ICD-10-CM

## 2020-10-10 DIAGNOSIS — Z5181 Encounter for therapeutic drug level monitoring: Secondary | ICD-10-CM

## 2020-10-10 MED ORDER — BREZTRI AEROSPHERE 160-9-4.8 MCG/ACT IN AERO: 2.0000 | INHALATION_SPRAY | Freq: Two times a day (BID) | RESPIRATORY_TRACT | 5 refills | Status: DC

## 2020-10-10 NOTE — Progress Notes (Signed)
Subjective:  [name] presents today to [facility] with his wife, Valla Leaver, to see pharmacy team for Esbriet new start and counseling.  His wife is a former [employer] scientist but does not take medications herself.  Patient was last seen by Dr. Chase Caller on 10/05/20 - presumptive diagnosis is IPF right now.  Pertinent past medical history includes TIA (September 2014), ischemic cardiomyopathy, HTN, CHF, CAD, and afib. He also has GERD (managed with OTC Tums prn), COPD, chronic constipation, hyperlipidemia.  He is naive to antifibrotics, however given his significant CV history, Esbriet is preferred over Ofev. He has history of amiodarone use, former smoker (quit 42 years ago), and plays harmonica.  He has many questions about the disease state itself.  History of elevated LFTs: No History of diarrhea, nausea, vomiting: No - he does  Objective: Allergies  Allergen Reactions   Sulfa Antibiotics Rash    Outpatient Encounter Medications as of 10/10/2020  Medication Sig Note   antiseptic oral rinse (BIOTENE) LIQD 15 mLs by Mouth Rinse route in the morning and at bedtime.    Budeson-Glycopyrrol-Formoterol (BREZTRI AEROSPHERE) 160-9-4.8 MCG/ACT AERO Inhale 2 puffs into the lungs in the morning and at bedtime.    carvedilol (COREG) 6.25 MG tablet TAKE 1 TABLET(6.25 MG) BY MOUTH TWICE DAILY WITH A MEAL    cholecalciferol (VITAMIN D) 1000 units tablet Take 1,000 Units by mouth every evening.    ENTRESTO 24-26 MG TAKE 1 TABLET BY MOUTH TWICE DAILY    eplerenone (INSPRA) 25 MG tablet Take 1 tablet (25 mg total) by mouth daily.    furosemide (LASIX) 40 MG tablet TAKE 2 TABLETS BY MOUTH EVERY DAY    ipratropium (ATROVENT) 0.06 % nasal spray Place 2 sprays into both nostrils 3 (three) times daily.    levalbuterol (XOPENEX HFA) 45 MCG/ACT inhaler Inhale 2 puffs into the lungs every 4 (four) hours as needed for wheezing.    loratadine (CLARITIN) 10 MG tablet Take 10 mg by mouth in the morning.    Magnesium 250  MG TABS Take 250 mg by mouth every evening.    mexiletine (MEXITIL) 150 MG capsule TAKE 2 CAPSULES(300 MG) BY MOUTH TWICE DAILY    Multiple Vitamin (MULTIVITAMIN WITH MINERALS) TABS Take 1 tablet by mouth every evening.    nitroGLYCERIN (NITROSTAT) 0.4 MG SL tablet DISSOLVE 1 TABLET UNDER THE TONGUE EVERY 5 MINUTES AS NEEDED FOR CHEST PAIN (Patient taking differently: Place 0.4 mg under the tongue every 5 (five) minutes x 3 doses as needed for chest pain.) 02/14/2020: Hasnt used in 10 years   polyvinyl alcohol-povidone (HYPOTEARS) 1.4-0.6 % ophthalmic solution Place 1-2 drops into both eyes in the morning and at bedtime.    potassium chloride SA (KLOR-CON) 20 MEQ tablet Take 1 tablet (20 mEq total) by mouth daily with lunch.    prednisoLONE acetate (PRED FORTE) 1 % ophthalmic suspension Place 1 drop into the left eye in the morning.    Probiotic Product (PROBIOTIC DAILY PO) Take 1 capsule by mouth every evening.    rivaroxaban (XARELTO) 20 MG TABS tablet TAKE 1 TABLET(20 MG) BY MOUTH DAILY WITH SUPPER    rosuvastatin (CRESTOR) 40 MG tablet Take 1 tablet (40 mg total) by mouth every evening.    triamcinolone (KENALOG) 0.1 % Apply 1 application topically 2 (two) times daily as needed (skin irritation).    Wheat Dextrin (BENEFIBER PO) Take 2 Scoops by mouth 2 (two) times daily.    XOPENEX HFA 45 MCG/ACT inhaler Inhale 1-2 puffs into  the lungs every 4 (four) hours as needed for wheezing.    No facility-administered encounter medications on file as of 10/10/2020.     Immunization History  Administered Date(s) Administered   Fluad Quad(high Dose 65+) 09/23/2018, 10/05/2020   Influenza Split 11/11/2010, 11/02/2012, 10/14/2013   Influenza Whole 09/27/2011, 11/13/2011   Influenza, High Dose Seasonal PF 09/26/2015, 10/07/2016   Influenza, Seasonal, Injecte, Preservative Fre 09/23/2018   Influenza,inj,Quad PF,6+ Mos 11/03/2016, 09/20/2017   Influenza-Unspecified 09/26/2014, 10/15/2019   PFIZER  Comirnaty(Gray Top)Covid-19 Tri-Sucrose Vaccine 04/24/2020   PFIZER(Purple Top)SARS-COV-2 Vaccination 02/02/2019, 02/22/2019, 10/26/2019, 09/29/2020   Pneumococcal Conjugate-13 06/15/2014   Pneumococcal Polysaccharide-23 03/15/2002, 07/10/2015   Td 08/27/2017   Tdap 04/04/2010   Zoster Recombinat (Shingrix) 03/29/2016   Zoster, Live 05/15/2015      PFT's TLC  Date Value Ref Range Status  09/27/2020 4.17 L Final    CMP     Component Value Date/Time   NA 137 05/17/2020 1121   NA 138 05/08/2020 1328   K 4.3 05/17/2020 1121   CL 104 05/17/2020 1121   CO2 28 05/17/2020 1121   GLUCOSE 116 (H) 05/17/2020 1121   BUN 16 05/17/2020 1121   BUN 12 05/08/2020 1328   CREATININE 0.93 05/17/2020 1121   CREATININE 0.95 06/11/2017 1637   CALCIUM 9.3 05/17/2020 1121   PROT 6.4 05/17/2020 1121   ALBUMIN 4.0 05/17/2020 1121   AST 21 05/17/2020 1121   ALT 21 05/17/2020 1121   ALKPHOS 49 05/17/2020 1121   BILITOT 0.7 05/17/2020 1121   GFRNONAA 72 01/18/2020 1517   GFRNONAA 75 04/19/2014 1202   GFRAA 83 01/18/2020 1517   GFRAA 87 04/19/2014 1202    CBC    Component Value Date/Time   WBC 7.8 05/17/2020 1121   RBC 4.41 05/17/2020 1121   HGB 14.9 05/17/2020 1121   HGB 15.1 05/08/2020 1328   HCT 44.3 05/17/2020 1121   HCT 44.5 05/08/2020 1328   PLT 286.0 05/17/2020 1121   PLT 309 05/08/2020 1328   MCV 100.4 (H) 05/17/2020 1121   MCV 98 (H) 05/08/2020 1328   MCH 33.3 (H) 05/08/2020 1328   MCH 35.1 (H) 06/11/2017 1637   MCHC 33.7 05/17/2020 1121   RDW 14.6 05/17/2020 1121   RDW 14.1 05/08/2020 1328   LYMPHSABS 1.9 05/17/2020 1121   MONOABS 0.6 05/17/2020 1121   EOSABS 0.6 05/17/2020 1121   BASOSABS 0.1 05/17/2020 1121    LFT's Hepatic Function Latest Ref Rng & Units 05/17/2020 11/17/2019 08/09/2019  Total Protein 6.0 - 8.3 g/dL 6.4 6.5 6.2  Albumin 3.5 - 5.2 g/dL 4.0 4.0 3.8  AST 0 - 37 U/L _0 ALT 0 - 53 U/L _1 Alk Phosphatase 39 - 117 U/L 49 49 55  Total Bilirubin  0.2 - 1.2 mg/dL 0.7 0.6 0.5  Bilirubin, Direct 0.0 - 0.3 mg/dL 0.1 0.1 0.1    HRCT (09/22/2020) - The appearance of the lungs is compatible with interstitial lung disease, categorized as probable usual interstitial pneumonia (UIP) per current ATS guidelines. Findings appear mildly progressive compared to the prior study.  Assessment and Plan I provided patient and his wife with information for [email or web address] and [email or web address].org as resources for disease state education and forums that include patients and providers.  Advised patient to discuss all disease-related questions with Dr. Chase Caller at f/u.  Esbriet Medication Management Thoroughly counseled patient on the efficacy, mechanism of action, dosing, administration, adverse effects, and monitoring parameters of Esbriet.  Patient verbalized  understanding. Patient education handout provided.   Goals of Therapy: Will not stop or reverse the progression of ILD. It will slow the progression of ILD.   Dosing: Starting dose will be Esbriet 267 mg 1 tablet three times daily for 7 days, then 2 tablets three times daily for 7 days, then 3 tablets three times daily.  Maintenance dose will be 801 mg 1 tablet three times daily if tolerated.  Stressed the importance of taking with meals and space at least 5-6 hours apart to minimize stomach upset.   Adverse Effects: Nausea, vomiting, diarrhea, weight loss Abdominal pain GERD Sun sensitivity/rash - patient advised to wear sunscreen when exposed to sunlight Dizziness Fatigue  Monitoring: Monitor for diarrhea, nausea and vomiting, GI perforation, hepatotoxicity  Monitor LFTs - baseline, monthly for first 6 months, then every 3 months routinely CBC w differential at baseline and every 3 months routinely  Access: Patient assistance paperwork completed today and placed in pharmacy office to be submitted once prior authorization response and estimated copay is determined.  Paitent is  amenable to starting benfeits investigation to ensure coverage is in place for Esbriet though he woud like to wait until his f/u with Dr. Chase Caller on 11/08/20 prior to starting.  Medication Reconciliation A drug regimen assessment was performed, including review of allergies, interactions, disease-state management, dosing and immunization history. Medications were reviewed with the patient, including name, instructions, indication, goals of therapy, potential side effects, importance of adherence, and safe use.  Immunizations Patient is indicated for the influenzae, pneumonia, and shingles vaccinations. Patient has received 4 COVID19 vaccines.  F/u with Dr. Chase Caller scheduled for 11/08/20  This appointment required 60 minutes of patient care (this includes precharting, chart review, review of results, face-to-face care, etc.).  Thank you for involving pharmacy to assist in providing this patient's care.   Knox Saliva, PharmD, MPH, BCPS Clinical Pharmacist (Rheumatology and Pulmonology)

## 2020-10-10 NOTE — Telephone Encounter (Signed)
Submitted a Prior Authorization request to [facility] for PIRFENIDONE via CoverMyMeds. Will update once we receive a response.  Key: [id number]   [place] PAP application completed at pharmacy clinic appointment today. Placed in PAP pending info folder in pharmacy office  Knox Saliva, PharmD, MPH, BCPS Clinical Pharmacist (Rheumatology and Pulmonology)

## 2020-10-10 NOTE — Telephone Encounter (Signed)
Please start pirfenidone BIV.  Dose with 232m tabs: Take 1 tab three times daily for 7 days, then 2 tabs three times daily for 7 days, then 3 tabs three times daily thereafter.   Dx: IPF ((Y37.096  [employer] PAP application completed at pharmacy clinic appointment today. Placed in PAP pending info folder in pharmacy office  DKnox Saliva PharmD, MPH, BCPS Clinical Pharmacist (Rheumatology and Pulmonology)

## 2020-10-12 ENCOUNTER — Other Ambulatory Visit (HOSPITAL_COMMUNITY): Payer: Self-pay

## 2020-10-12 NOTE — Telephone Encounter (Signed)
Received notification from [facility] regarding a prior authorization for PIRFENIDONE. Authorization has been APPROVED from 10/10/20 to 10/10/21.   Per test claim, copay for 30 days supply is $312.95  Patient can fill through [facility]: [phone]   Authorization # Key: [id number]

## 2020-10-12 NOTE — Telephone Encounter (Signed)
Submitted Patient Assistance Application to [place] for [place] along with provider portion, PA, and patient portion.  Will update patient when we receive a response.  Called patient to advise and he would like to wait to see Dr. Chase Caller in 11/08/20 to start medication. I reviewed that I spoke with Dr. Alfonso Patten after our visit and he was comfortable with patient starting prior, however patient would like to further discuss his ILD questionnaire though this will not affect standard of care with antifibrotic for him.  Advised that we submitted PAP application so that coverage is in place and there is no delay in him starting after his appt on 11/08/20  Fax# [phone] Phone# [phone]  Knox Saliva, PharmD, MPH, BCPS Clinical Pharmacist (Rheumatology and Pulmonology)

## 2020-10-18 NOTE — Telephone Encounter (Signed)
Received a fax from  [place] regarding an approval for [name] patient assistance from 10/18/20 until further notice.   Phone number: [phone]  Medication will come form [employer]- phone# [phone]

## 2020-10-18 NOTE — Telephone Encounter (Signed)
Spoke with patient about Esbriet approval through [place] PAP. I provided patient with phone number for [employer] and [employer]. We reviewed that if he does not hear from [place] by Wednesday, 10/25/20, he should reach out to them to complete onboarding. Then once he is onboarded, he can schedule shipment of Esbriet to his home with [employer]. He has been advised that Dr. Chase Caller supports him starting Esbriet before his upcoming appt on 11/08/20 as treatment course will be not be different pending discussion at that appointment.  He verbalized understanding.   Patient provided with direct office phone number to pharmacy team and advised to reach out if he has issues with scheduling shipment. Will f/u on Thursday, 10/26/20 to ensure he has made contact with Lucien Mons, PharmD, MPH, BCPS Clinical Pharmacist (Rheumatology and Pulmonology)

## 2020-10-21 ENCOUNTER — Other Ambulatory Visit: Payer: Self-pay | Admitting: Cardiology

## 2020-10-21 DIAGNOSIS — I5022 Chronic systolic (congestive) heart failure: Secondary | ICD-10-CM

## 2020-10-22 ENCOUNTER — Other Ambulatory Visit: Payer: Self-pay | Admitting: Cardiology

## 2020-10-22 DIAGNOSIS — I5022 Chronic systolic (congestive) heart failure: Secondary | ICD-10-CM

## 2020-10-25 ENCOUNTER — Ambulatory Visit (INDEPENDENT_AMBULATORY_CARE_PROVIDER_SITE_OTHER): Payer: Medicare Other

## 2020-10-25 DIAGNOSIS — I5022 Chronic systolic (congestive) heart failure: Secondary | ICD-10-CM

## 2020-10-25 DIAGNOSIS — J441 Chronic obstructive pulmonary disease with (acute) exacerbation: Secondary | ICD-10-CM

## 2020-10-25 DIAGNOSIS — I4821 Permanent atrial fibrillation: Secondary | ICD-10-CM

## 2020-10-25 NOTE — Progress Notes (Signed)
Chronic Care Management Pharmacy Note  10/26/2020 Name:  [name] MRN:  [id number] DOB:  [birth year]  Summary: -helped pt apply for healthwell grant to help with entresto copay - approved  Plan:  Subjective: [name] is an 80 y.o. year old male who is a primary patient of Tabori, [name], MD.  The CCM team was consulted for assistance with disease management and care coordination needs.    Engaged with patient by telephone for follow up visit in response to provider referral for pharmacy case management and/or care coordination services.   Consent to Services:  The patient was given information about Chronic Care Management services, agreed to services, and gave verbal consent prior to initiation of services.  Please see initial visit note for detailed documentation.   Patient Care Team: [name], MD as PCP - General (Family Medicine) Festus Aloe, MD as Consulting Physician (Urology) Michael Boston, MD as Consulting Physician (General Surgery) [name], MD as Consulting Physician (Cardiology) Jodi Marble, MD as Consulting Physician (Otolaryngology) Deboraha Sprang, MD as Consulting Physician (Cardiology) Luberta Mutter, MD as Consulting Physician (Ophthalmology) Collene Gobble, MD as Consulting Physician (Pulmonary Disease) Mauri Pole, MD as Consulting Physician (Gastroenterology) [name], [facility] as Pharmacist (Pharmacist)  Objective:  Lab Results  Component Value Date   CREATININE 0.93 05/17/2020   CREATININE 1.05 05/08/2020   CREATININE 1.00 01/18/2020    Lab Results  Component Value Date   HGBA1C 5.9 05/18/2019   Last diabetic Eye exam: No results found for: HMDIABEYEEXA  Last diabetic Foot exam: No results found for: HMDIABFOOTEX      Component Value Date/Time   CHOL 135 05/17/2020 1121   TRIG 105.0 05/17/2020 1121   HDL 32.50 (L) 05/17/2020 1121   CHOLHDL 4 05/17/2020 1121   VLDL 21.0 05/17/2020 1121    LDLCALC 81 05/17/2020 1121   LDLCALC 169 (H) 06/11/2017 1637   LDLDIRECT 168.0 07/12/2015 0928    Hepatic Function Latest Ref Rng & Units 05/17/2020 11/17/2019 08/09/2019  Total Protein 6.0 - 8.3 g/dL 6.4 6.5 6.2  Albumin 3.5 - 5.2 g/dL 4.0 4.0 3.8  AST 0 - 37 U/L _0 ALT 0 - 53 U/L _1 Alk Phosphatase 39 - 117 U/L 49 49 55  Total Bilirubin 0.2 - 1.2 mg/dL 0.7 0.6 0.5  Bilirubin, Direct 0.0 - 0.3 mg/dL 0.1 0.1 0.1    Lab Results  Component Value Date/Time   TSH 2.01 05/17/2020 11:21 AM   TSH 1.96 11/17/2019 11:11 AM    CBC Latest Ref Rng & Units 05/17/2020 05/08/2020 01/18/2020  WBC 4.0 - 10.5 K/uL 7.8 8.0 7.9  Hemoglobin 13.0 - 17.0 g/dL 14.9 15.1 15.5  Hematocrit 39.0 - 52.0 % 44.3 44.5 45.8  Platelets 150.0 - 400.0 K/uL 286.0 309 309    Lab Results  Component Value Date/Time   VD25OH 37.42 11/17/2019 11:11 AM   VD25OH 42.41 10/29/2018 11:24 AM   Clinical ASCVD: Yes  The 10-year ASCVD risk score (Arnett DK, et al., 2019) is: 35.4%   Values used to calculate the score:     Age: 16 years     Sex: Male     Is Non-Hispanic African American: No     Diabetic: No     Tobacco smoker: No     Systolic Blood Pressure: 474 mmHg     Is BP treated: Yes     HDL Cholesterol: 32.5 mg/dL  Total Cholesterol: 135 mg/dL    Other: (CHADS2VASc if Afib, PHQ9 if depression, MMRC or CAT for COPD, ACT, DEXA)  Social History   Tobacco Use  Smoking Status Former   Packs/day: 2.00   Years: 15.00   Pack years: 30.00   Types: Cigarettes   Quit date: 01/15/1980   Years since quitting: 40.8  Smokeless Tobacco Never   BP Readings from Last 3 Encounters:  10/05/20 126/70  09/27/20 117/74  06/28/20 104/74   Pulse Readings from Last 3 Encounters:  10/05/20 70  09/27/20 70  06/28/20 70   Wt Readings from Last 3 Encounters:  10/05/20 208 lb 3.2 oz (94.4 kg)  09/27/20 210 lb (95.3 kg)  06/28/20 209 lb (94.8 kg)    Assessment: Review of patient past medical history,  allergies, medications, health status, including review of consultants reports, laboratory and other test data, was performed as part of comprehensive evaluation and provision of chronic care management services.   SDOH:  (Social Determinants of Health) assessments and interventions performed: Yes   CCM Care Plan  Allergies  Allergen Reactions   Sulfa Antibiotics Rash    Medications Reviewed Today     Reviewed by [name], [facility] (Pharmacist) on 10/25/20 at 1615  Med List Status: <None>   Medication Order Taking? Sig Documenting Provider Last Dose Status Informant  antiseptic oral rinse (BIOTENE) LIQD [id number] No 15 mLs by Mouth Rinse route in the morning and at bedtime. [provider] Taking Active Self  Budeson-Glycopyrrol-Formoterol (BREZTRI AEROSPHERE) 160-9-4.8 MCG/ACT AERO [id number]  Inhale 2 puffs into the lungs in the morning and at bedtime. Collene Gobble, MD  Active   carvedilol (COREG) 6.25 MG tablet [id number] No TAKE 1 TABLET(6.25 MG) BY MOUTH TWICE DAILY WITH A MEAL Crenshaw, Denice Bors, MD Taking Active   cholecalciferol (VITAMIN D) 1000 units tablet [id number] No Take 1,000 Units by mouth every evening. [provider] Taking Active Self  eplerenone (INSPRA) 25 MG tablet [id number] No Take 1 tablet (25 mg total) by mouth daily. [name], MD Taking Active   furosemide (LASIX) 40 MG tablet [id number] No TAKE 2 TABLETS BY MOUTH EVERY DAY Stanford Breed, Denice Bors, MD Taking Active   ipratropium (ATROVENT) 0.06 % nasal spray [id number] No Place 2 sprays into both nostrils 3 (three) times daily. Collene Gobble, MD Taking Active   levalbuterol [facility] HFA) 45 MCG/ACT inhaler [id number] No Inhale 2 puffs into the lungs every 4 (four) hours as needed for wheezing. Collene Gobble, MD Taking Active   loratadine (CLARITIN) 10 MG tablet [id number] No Take 10 mg by mouth in the morning. [provider] Taking Active Self  Magnesium 250 MG TABS [id number] No Take  250 mg by mouth every evening. [provider] Taking Active Self  mexiletine (MEXITIL) 150 MG capsule [id number] No TAKE 2 CAPSULES(300 MG) BY MOUTH TWICE DAILY Crenshaw, Denice Bors, MD Taking Active   Multiple Vitamin (MULTIVITAMIN WITH MINERALS) TABS [id number] No Take 1 tablet by mouth every evening. [provider] Taking Active Self  nitroGLYCERIN (NITROSTAT) 0.4 MG SL tablet [id number] No DISSOLVE 1 TABLET UNDER THE TONGUE EVERY 5 MINUTES AS NEEDED FOR CHEST PAIN  Patient taking differently: Place 0.4 mg under the tongue every 5 (five) minutes x 3 doses as needed for chest pain.   [name], MD Taking Active            Med Note Ramon Dredge, JESSICA K   Mon Feb 14, 2020 11:45 AM) Nicanor Alcon  used in 10 years  polyvinyl alcohol-povidone (HYPOTEARS) 1.4-0.6 % ophthalmic solution [id number] No Place 1-2 drops into both eyes in the morning and at bedtime. [provider] Taking Active Self  potassium chloride SA (KLOR-CON) 20 MEQ tablet [id number] No Take 1 tablet (20 mEq total) by mouth daily with lunch. [name], MD Taking Active   prednisoLONE acetate (PRED FORTE) 1 % ophthalmic suspension [id number] No Place 1 drop into the left eye in the morning. [provider] Taking Active   Probiotic Product (PROBIOTIC DAILY PO) [id number] No Take 1 capsule by mouth every evening. [provider] Taking Active Self  rivaroxaban (XARELTO) 20 MG TABS tablet [id number] No TAKE 1 TABLET(20 MG) BY MOUTH DAILY WITH SUPPER Stanford Breed, Denice Bors, MD Taking Active   rosuvastatin (CRESTOR) 40 MG tablet [id number] No Take 1 tablet (40 mg total) by mouth every evening. [name], MD Taking Active   sacubitril-valsartan [facility]) 24-26 [place] [id number]  TAKE 1 TABLET BY MOUTH TWICE DAILY [name], MD  Active   triamcinolone (KENALOG) 0.1 % [id number] No Apply 1 application topically 2 (two) times daily as needed (skin irritation). [provider] Taking Active  Self  Wheat Dextrin (BENEFIBER PO) [id number] No Take 2 Scoops by mouth 2 (two) times daily. [provider] Taking Active Self  XOPENEX HFA 45 MCG/ACT inhaler [id number] No Inhale 1-2 puffs into the lungs every 4 (four) hours as needed for wheezing. Collene Gobble, MD Taking Active             Patient Active Problem List   Diagnosis Date Noted   Encounter for follow-up surveillance of colon cancer    Benign neoplasm of descending colon    Pulmonary nodules 05/18/2015   Rectal bleeding 03/29/2015   Internal hemorrhoid, bleeding 03/29/2015   COPD (chronic obstructive pulmonary disease) ([place]) 03/28/2015   Interstitial lung disease ([place]) 03/28/2015   Gastroesophageal reflux disease 03/17/2015   Prostate cancer screening 03/09/2014   Thoracic aortic aneurysm 09/28/2013   Encounter for therapeutic drug monitoring 03/03/2013   Constipation, chronic 73/42/8768   Umbilical hernia with intermittent pain 10/21/2012   Obesity (BMI 30-39.9) 10/21/2012   TIA (transient ischemic attack) 09/25/2012   Other malaise and fatigue 08/25/2012   Vitamin D deficiency 07/28/2012   CHF (congestive heart failure) ([place]) 07/28/2012   Chronotropic incompetence 10/25/2011   History of non anemic vitamin B12 deficiency 05/29/2011   Mild cognitive impairment 01/13/2011   Ringing in ears 01/13/2011   CAD (coronary artery disease) 09/14/2010   Elevated transaminase level--on amiodarone 06/19/2010   Biventricular implantable cardioverter-defibrillator in situ 05/16/2010   Long term (current) use of anticoagulants 04/11/2010   Microscopic hematuria 04/04/2010   CLAUDICATION 11/28/2009   Atrial fibrillation ([place]) 06/29/2009   Chronic combined systolic and diastolic heart failure ([place]) 05/26/2009   Ischemic cardiomyopathy 07/07/2008   VENTRICULAR TACHYCARDIA 06/29/2008   Hyperlipidemia 01/04/2008   Essential hypertension 01/04/2008   Chronic rhinitis 03/04/2007   Obstructive sleep apnea 03/02/2007     Immunization History  Administered Date(s) Administered   Fluad Quad(high Dose 65+) 09/23/2018, 10/05/2020   Influenza Split 11/11/2010, 11/02/2012, 10/14/2013   Influenza Whole 09/27/2011, 11/13/2011   Influenza, High Dose Seasonal PF 09/26/2015, 10/07/2016   Influenza, Seasonal, Injecte, Preservative Fre 09/23/2018   Influenza,inj,Quad PF,6+ Mos 11/03/2016, 09/20/2017   Influenza-Unspecified 09/26/2014, 10/15/2019   PFIZER Comirnaty(Gray Top)Covid-19 Tri-Sucrose Vaccine 04/24/2020   PFIZER(Purple Top)SARS-COV-2 Vaccination 02/02/2019, 02/22/2019, 10/26/2019, 09/29/2020   Pneumococcal Conjugate-13 06/15/2014   Pneumococcal Polysaccharide-23 03/15/2002,  07/10/2015   Td 08/27/2017   Tdap 04/04/2010   Zoster Recombinat (Shingrix) 03/29/2016   Zoster, Live 05/15/2015   Conditions to be addressed/monitored: Atrial Fibrillation, CHF, HTN, HLD, and Hypertriglyceridemia  Hypertension, CHF (BP goal <130/80) -Controlled -Current treatment: Coreg 6.25 mg BID Entresto 24-26 mg BID Furosemide 40 mg 2 tabs daily  -Current home readings: none provided -Current dietary habits: low salt -Current exercise habits: no formal exercise. Limited due to chf copd ild  -Denies hypotensive/hypertensive symptoms -Educated on BP goals and benefits of medications for prevention of heart attack, stroke and kidney damage; -Counseled to monitor BP at home 3-4x/wk, document, and provide log at future appointments -Recommended to continue current medication Assessed patient finances. Helped patient apply for healthwell grant due to high copay with entresto - approved.  CPA to mail grant information to patient and contact pharmacy w/ grant information.   Hyperlipidemia: (LDL goal < 70) -Not ideally controlled -Current treatment: Rosuvastatin 40 mg once daily  -Medications previously tried: previously noted rash with crestor, tolerating well. No side effects noted  -Educated on Cholesterol goals;   -Recommended to continue current medication  Atrial Fibrillation (Goal: prevent stroke and major bleeding) -Not ideally controlled -CHADSVASC: 7 (>10% annual stroke risk) -Current treatment: Rate control: Coreg Anticoagulation: Xarelto -Medications previously tried: amiodarone -Home BP and HR readings: none provided  -Counseled on increased risk of stroke due to Afib and benefits of anticoagulation for stroke prevention; importance of adherence to anticoagulant exactly as prescribed; -Assessed patient finances. Based on estimated finances, unlikely to qualify for Xarelto PAP  COPD,  (Goal: control symptoms and prevent exacerbations) -Not ideally controlled New dgx of ILD, set to start Esbriet through pulm. Pharmacist there helped patient get approved for PAP.  -Current treatment  Xopenex prn Breztri  -Medications previously tried: symbicort, albuterol   -Gold Grade: Gold 2 (FEV1 50-79%) -Current COPD Classification:  B (high sx, <2 exacerbations/yr) -Pulmonary function testing: 09/2020  -Exacerbations requiring treatment in last 6 months: 0 -Patient reports consistent use of maintenance inhaler -Frequency of rescue inhaler use: ~2x/wk -Counseled on Proper inhaler technique; Benefits of consistent maintenance inhaler use When to use rescue inhaler Differences between maintenance and rescue inhalers -Recommended to continue current medication Assessed patient finances. Unlikely to qualify for assistance on Breztri    Medication Assistance: None required.  Patient affirms current coverage meets needs.  Patient's preferred pharmacy is:  [id number] - [place], [place] - [address] [place] [address] Phone: [phone] Fax: [phone]  Follow Up:  Patient agrees to Care Plan and Follow-up.  Plan:  2 month BP call, 4 month CPP f/u call   Future Appointments  Date Time Provider Felicity  11/06/2020  3:45 PM [facility] HEALTH COACH [facility]  11/08/2020  2:30 PM Brand Males, MD [facility] None  11/16/2020 10:00 AM [name], MD [facility]  11/17/2020  7:00 AM [facility] DEVICE REMOTES [facility]  12/27/2020 10:40 AM [name], MD [facility] None  02/16/2021  7:00 AM [facility] DEVICE REMOTES [facility]  05/18/2021  7:00 AM [facility] DEVICE REMOTES [facility]  08/17/2021  7:00 AM [facility] DEVICE REMOTES [facility]   [name], PharmD, CPP Clinical Pharmacist Practitioner  [facility] [phone]

## 2020-10-26 ENCOUNTER — Telehealth: Payer: Self-pay

## 2020-10-26 NOTE — Patient Instructions (Signed)
[name],  Thank you for talking with me today. I have included our care plan/goals in the following pages.   Please review and call me at [phone] with any questions.  Thanks! [name], PharmD, CPP Clinical Pharmacist Practitioner  [phone]  Hypertension, CHF (BP goal <130/80) -Controlled -Current treatment: Coreg 6.25 mg BID Entresto 24-26 mg BID Furosemide 40 mg 2 tabs daily  -Current home readings: none provided -Current dietary habits: low salt -Current exercise habits: no formal exercise. Limited due to chf copd ild  -Denies hypotensive/hypertensive symptoms -Educated on BP goals and benefits of medications for prevention of heart attack, stroke and kidney damage; -Counseled to monitor BP at home 3-4x/wk, document, and provide log at future appointments -Recommended to continue current medication Assessed patient finances. Helped patient apply for healthwell grant due to high copay with entresto - approved.  CPA to mail grant information to patient and contact pharmacy w/ grant information.   Hyperlipidemia: (LDL goal < 70) -Not ideally controlled -Current treatment: Rosuvastatin 40 mg once daily  -Medications previously tried: previously noted rash with crestor, tolerating well. No side effects noted  -Educated on Cholesterol goals;  -Recommended to continue current medication  Atrial Fibrillation (Goal: prevent stroke and major bleeding) -Not ideally controlled -CHADSVASC: 7 (>10% annual stroke risk) -Current treatment: Rate control: Coreg Anticoagulation: Xarelto -Medications previously tried: amiodarone -Home BP and HR readings: none provided  -Counseled on increased risk of stroke due to Afib and benefits of anticoagulation for stroke prevention; importance of adherence to anticoagulant exactly as prescribed; -Assessed patient finances. Based on estimated finances, unlikely to qualify for Xarelto PAP  COPD,  (Goal: control symptoms  and prevent exacerbations) -Not ideally controlled New dgx of ILD, set to start Esbriet through pulm. Pharmacist there helped patient get approved for PAP.  -Current treatment  Xopenex prn Breztri  -Medications previously tried: symbicort, albuterol   -Gold Grade: Gold 2 (FEV1 50-79%) -Current COPD Classification:  B (high sx, <2 exacerbations/yr) -Pulmonary function testing: 09/2020  -Exacerbations requiring treatment in last 6 months: 0 -Patient reports consistent use of maintenance inhaler -Frequency of rescue inhaler use: ~2x/wk -Counseled on Proper inhaler technique; Benefits of consistent maintenance inhaler use When to use rescue inhaler Differences between maintenance and rescue inhalers -Recommended to continue current medication Assessed patient finances. Unlikely to qualify for assistance on Breztri  The patient verbalized understanding of instructions provided today and declined copy of patient instruction and/or educational materials. Telephone follow up appointment with pharmacy team member scheduled for: See next appointment with "Care Management Staff" under "What's Next" below.

## 2020-10-26 NOTE — Progress Notes (Signed)
    Chronic Care Management Pharmacy Assistant   Name: [name]  MRN: [id number] DOB: Sep 29, 1940   Reason for Encounter: CMA Phone Call - [name] Savings Approval      Per CPP, [name] was approved for additional saving card for [place]. This can be applied to [place], Coreg, and Inspra and any of the below medications as needed at present or in the future through 09/2021 (we can re-enroll you at that time). Notified patient and also called and notified patient's pharmacy [facility] [phone]) as well. Mailed patient a hard copy of this information for future use as well.    [name], [place] Pharmacist Assistant  [phone]  Time Spent: 25 minutes

## 2020-10-27 ENCOUNTER — Other Ambulatory Visit: Payer: Self-pay | Admitting: Cardiology

## 2020-10-27 DIAGNOSIS — I059 Rheumatic mitral valve disease, unspecified: Secondary | ICD-10-CM

## 2020-10-27 DIAGNOSIS — I480 Paroxysmal atrial fibrillation: Secondary | ICD-10-CM

## 2020-10-27 DIAGNOSIS — I429 Cardiomyopathy, unspecified: Secondary | ICD-10-CM

## 2020-10-27 DIAGNOSIS — Z95810 Presence of automatic (implantable) cardiac defibrillator: Secondary | ICD-10-CM

## 2020-11-01 ENCOUNTER — Telehealth: Payer: Self-pay | Admitting: Family Medicine

## 2020-11-01 ENCOUNTER — Telehealth: Payer: Self-pay | Admitting: Internal Medicine

## 2020-11-01 MED ORDER — PIRFENIDONE 267 MG PO TABS: ORAL_TABLET | ORAL | 0 refills | Status: DC

## 2020-11-01 MED ORDER — PIRFENIDONE 267 MG PO TABS: 801.0000 mg | ORAL_TABLET | Freq: Three times a day (TID) | ORAL | 1 refills | Status: DC

## 2020-11-01 NOTE — Telephone Encounter (Signed)
..  Caller name:  [name]  On DPR? :yes/no: Yes  Call back number:[phone]  Provider they see: [name]  Reason for call: Patient would like you to look at his file from [facility] - he would like your opinion.  His main concern is about side effects from medication.  Patient would like to talk to you before he starts any medication.

## 2020-11-01 NOTE — Telephone Encounter (Signed)
Returned call to patient regarding taking Esbriet with other medications. Advised he may take with his other medications without concerns for interactions or change in pharmacokinetics.  Knox Saliva, PharmD, MPH, BCPS Clinical Pharmacist (Rheumatology and Pulmonology)

## 2020-11-01 NOTE — Telephone Encounter (Signed)
I have reviewed his pulmonary notes and the visit w/ the pharmacist.  Both of the medications discussed are outside of my realm of expertise but it sounds as if Dr Chase Caller and the pharmacist have both given careful consideration to the options and feel this one is best.  I would support starting it based on their recommendations.

## 2020-11-01 NOTE — Telephone Encounter (Signed)
Called patient to fu on if he received Esbriet. States he received Esbriet shipment today but is having a bout of diarrhea since he ate fried chicken over the weekend. He plans to start Esbriet on 11/06/20.   F/u with Dr. Geoffery Spruce on 11/08/20.  Rx for Month 1 titration dosing of Esbriet added to med list today as no print. Maintenance rx sent to [facility] (for 3 tabs (881m) three times daily)  Future hepatic function panel order today  Will route to Dr. RGeoffery Spruceas FJuluis Rainier  DKnox Saliva PharmD, MPH, BCPS Clinical Pharmacist (Rheumatology and Pulmonology)

## 2020-11-01 NOTE — Telephone Encounter (Signed)
Called patient back about concerns from his pulmonary visit. Per Dr [name], she agrees with with pulmonary and the pharmacist on the option they feel are the best. Patient understood. No further concerns at this time.

## 2020-11-02 ENCOUNTER — Ambulatory Visit: Payer: Medicare Other | Admitting: Internal Medicine

## 2020-11-06 ENCOUNTER — Ambulatory Visit (INDEPENDENT_AMBULATORY_CARE_PROVIDER_SITE_OTHER): Payer: Medicare Other | Admitting: *Deleted

## 2020-11-06 VITALS — BP 108/77 | HR 67 | Temp 96.9°F

## 2020-11-06 DIAGNOSIS — Z0000 Encounter for general adult medical examination without abnormal findings: Secondary | ICD-10-CM | POA: Diagnosis not present

## 2020-11-06 NOTE — Progress Notes (Signed)
Subjective:   [name] is a 80 y.o. male who presents for Medicare Annual/Subsequent preventive examination.  I connected with  [name] on 11/06/20 by a telpehone enabled telemedicine application and verified that I am speaking with the correct person using two identifiers.   I discussed the limitations of evaluation and management by telemedicine. The patient expressed understanding and agreed to proceed.   .  Review of Systems     Cardiac Risk Factors include: advanced age (>15mn, >>62women);hypertension;male gender;obesity (BMI >30kg/m2)     Objective:    Today's Vitals   11/06/20 1228  BP: 108/77  Pulse: 67  Temp: (!) 96.9 F (36.1 C)   There is no height or weight on file to calculate BMI.  Advanced Directives 11/06/2020 05/10/2020 02/14/2020 11/01/2019 06/11/2017 12/27/2015 08/18/2015  Does Patient Have a Medical Advance Directive? _0  Yes Yes  Type of Advance Directive Healthcare Power of Attorney Living will;Healthcare Power of [place] will [place] will [place]Living will Living will;Healthcare Power of [place] will  Does patient want to make changes to medical advance directive? - - - - - No - Patient declined -  Copy of H[place]adisein Chart? Yes - validated most recent copy scanned in chart (See row information) Yes - validated most recent copy scanned in chart (See row information) Yes - validated most recent copy scanned in chart (See row information) No - copy requested No - copy requested No - copy requested No - copy requested  Pre-existing out of facility DNR order (yellow form or pink MOST form) - - - - - - -    Current Medications (verified) Outpatient Encounter Medications as of 11/06/2020  Medication Sig   antiseptic oral rinse (BIOTENE) LIQD 15 mLs by Mouth Rinse route in the morning and at  bedtime.   Budeson-Glycopyrrol-Formoterol (BREZTRI AEROSPHERE) 160-9-4.8 MCG/ACT AERO Inhale 2 puffs into the lungs in the morning and at bedtime.   carvedilol (COREG) 6.25 MG tablet TAKE 1 TABLET(6.25 MG) BY MOUTH TWICE DAILY WITH A MEAL   cholecalciferol (VITAMIN D) 1000 units tablet Take 1,000 Units by mouth every evening.   eplerenone (INSPRA) 25 MG tablet Take 1 tablet (25 mg total) by mouth daily.   furosemide (LASIX) 40 MG tablet TAKE 2 TABLETS BY MOUTH EVERY DAY   ipratropium (ATROVENT) 0.06 % nasal spray Place 2 sprays into both nostrils 3 (three) times daily.   levalbuterol (XOPENEX HFA) 45 MCG/ACT inhaler Inhale 2 puffs into the lungs every 4 (four) hours as needed for wheezing.   loratadine (CLARITIN) 10 MG tablet Take 10 mg by mouth in the morning.   Magnesium 250 MG TABS Take 250 mg by mouth every evening.   mexiletine (MEXITIL) 150 MG capsule TAKE 2 CAPSULES(300 MG) BY MOUTH TWICE DAILY   Multiple Vitamin (MULTIVITAMIN WITH MINERALS) TABS Take 1 tablet by mouth every evening.   nitroGLYCERIN (NITROSTAT) 0.4 MG SL tablet DISSOLVE 1 TABLET UNDER THE TONGUE EVERY 5 MINUTES AS NEEDED FOR CHEST PAIN (Patient taking differently: Place 0.4 mg under the tongue every 5 (five) minutes x 3 doses as needed for chest pain.)   [START ON 12/06/2020] Pirfenidone (ESBRIET) 267 MG TABS Take 3 tablets (801 mg total) by mouth with breakfast, with lunch, and with evening meal.   polyvinyl alcohol-povidone (HYPOTEARS) 1.4-0.6 % ophthalmic solution Place 1-2 drops into both eyes in the morning  and at bedtime.   potassium chloride SA (KLOR-CON) 20 MEQ tablet Take 1 tablet (20 mEq total) by mouth daily with lunch.   prednisoLONE acetate (PRED FORTE) 1 % ophthalmic suspension Place 1 drop into the left eye in the morning.   Probiotic Product (PROBIOTIC DAILY PO) Take 1 capsule by mouth every evening.   rivaroxaban (XARELTO) 20 MG TABS tablet TAKE 1 TABLET(20 MG) BY MOUTH DAILY WITH SUPPER   rosuvastatin  (CRESTOR) 40 MG tablet Take 1 tablet (40 mg total) by mouth every evening.   sacubitril-valsartan (ENTRESTO) 24-26 MG TAKE 1 TABLET BY MOUTH TWICE DAILY   triamcinolone (KENALOG) 0.1 % Apply 1 application topically 2 (two) times daily as needed (skin irritation).   Wheat Dextrin (BENEFIBER PO) Take 2 Scoops by mouth 2 (two) times daily.   XOPENEX HFA 45 MCG/ACT inhaler Inhale 1-2 puffs into the lungs every 4 (four) hours as needed for wheezing.   Pirfenidone (ESBRIET) 267 MG TABS Take 1 tab three times daily for 7 days, then 2 tabs three times daily for 7 days, then 3 tabs three times daily thereafter. (Patient not taking: Reported on 11/06/2020)   No facility-administered encounter medications on file as of 11/06/2020.    Allergies (verified) Sulfa antibiotics   History: Past Medical History:  Diagnosis Date   Adenomatous colon polyp 2009; 06/2012   Colonoscopy 07/2007---Dr. Barron Schmid GI--Repeat 06/2012 showed one tubular adenoma w/out high grade dysplasia.   AICD (automatic cardioverter/defibrillator) present    Chemical engineer   Atrial fibrillation ([place])    Bilateral sensorineural hearing loss    BPH (benign prostatic hypertrophy)    PSAs ok (followed by urologist): no LUTS   CAD (coronary artery disease)    PTCA RCA 1997 (Inf/post MI);   CHF (congestive heart failure) ([place])    Chronic constipation    Chronic rhinitis    ?vasomotor (Dr. Erik Obey)   COPD (chronic obstructive pulmonary disease) ([place])    Diverticulosis of colon 2005   Noted on Colonoscopies in 2005 and 2009 and on CT 2010   Gallstones 06/2014   No evidence of cholecystitis on abd u/s 06/2014   GERD (gastroesophageal reflux disease)    Hepatic steatosis 2010; 2016   Noted on CT abd 2010 and on u/s abd 04/2009 (transaminasemia) and u/s 06/2014.   Hyperlipidemia    Hypertension    Ischemic cardiomyopathy    Microscopic hematuria    Cysto neg 02/2009 except for BPH-likely has microhem from his renal stone dz    Nephrolithiasis    No distal stones on CT 2010; stable 8 mm RLP stone, 108m LMP stone 10/2013 plain film.  Abd u/s 06/2014: 9 mm nonobstructing lower pole right renal stone.  03/2015 urol: stable nonobstructing renal calc by CT 12/2014 and KUB 03/2015.   Obstructive sleep apnea    no cpap uses since weight loss of 50 pounds   Presence of permanent cardiac pacemaker    [employer]   Squamous cell carcinoma of back    Ventricular tachycardia    Past Surgical History:  Procedure Laterality Date   ATRIAL ABLATION SURGERY     flutter ablation 2002   BREAST SURGERY Left 1990   "Tissue removed from breast"   [place]/A 05/10/2020   Procedure: CARDIOVERSION;  Surgeon: CSanda Klein MD;  [facility];  Service: Cardiovascular;  Laterality: N/A;   carotid doppler  09/25/12   No signif stenosis   COLONOSCOPY N/A 07/13/2012  Diverticula ascending colon.  Polyp x 1-recall 5 yrs (Dr. Watt Climes) Procedure: COLONOSCOPY;  Surgeon: Jeryl Columbia, MD;  Location: [facility];  Service: Endoscopy;  Laterality: N/A; polypectomy x 1    COLONOSCOPY WITH PROPOFOL N/A 08/18/2015   Procedure: COLONOSCOPY WITH PROPOFOL;  Surgeon: Mauri Pole, MD;  Location: [facility];  Service: Endoscopy;  Laterality: N/A;   ESOPHAGOGASTRODUODENOSCOPY N/A 07/13/2012   Procedure: ESOPHAGOGASTRODUODENOSCOPY (EGD);  Surgeon: Jeryl Columbia, MD;  Location: [facility];  Service: Endoscopy;  Laterality: N/A;  NORMAL   EXTRACORPOREAL SHOCK WAVE LITHOTRIPSY     HEMORRHOID BANDING     ICD     a) Guidant Contak H170- Virl Axe, MD 07/25/06 (second device) b) Medtronic CRT-D   ICD GENERATOR CHANGEOUT N/A 02/14/2020   Procedure: ICD GENERATOR CHANGEOUT;  Surgeon: Deboraha Sprang, MD;  Location: [place] [facility];  Service: Cardiovascular;  Laterality: N/A;   INGUINAL HERNIA REPAIR Bilateral 1975   LEAD REVISION Bilateral 12/26/2011   Procedure: LEAD REVISION;  Surgeon: Evans Lance,  MD;  Location[facility];  Service: Cardiovascular;  Laterality: Bilateral;   PFTs  02/2015   Mod restriction (interstitial), mod diffusion defect (Dr. Annamaria Boots)   precancer area keratsis removed  05/2015    leftf [place] and left arm and right arm and back   Family History  Problem Relation Age of Onset   Heart disease Mother    Other Mother        colon surgery for fistula   Gallstones Mother    Breast cancer Maternal Grandmother    Coronary artery disease Father        family hx of   Heart attack Father        4 stents/ pacemaker   Nephrolithiasis Father    Hypertension Sister    Goiter Paternal Grandmother    Nephrolithiasis Son    Colon cancer Neg Hx    Social History   Socioeconomic History   Marital status: Married    Spouse name: Not on file   Number of children: 3   Years of education: Not on file   Highest education level: Not on file  Occupational History   Occupation: Retired    Fish farm manager: RETIRED  Tobacco Use   Smoking status: Former    Packs/day: 2.00    Years: 15.00    Pack years: 30.00    Types: Cigarettes    Quit date: 01/15/1980    Years since quitting: 40.8   Smokeless tobacco: Never  Vaping Use   Vaping Use: Never used  Substance and Sexual Activity   Alcohol use: No    Alcohol/week: 0.0 standard drinks   Drug use: No   Sexual activity: Yes  Other Topics Concern   Not on file  Social History Narrative   Married.   Hx of cigarettes: quit in the 1980s.  No alc/drugs.   Social Determinants of Health   Financial Resource Strain: Low Risk    Difficulty of Paying Living Expenses: Not hard at all  Food Insecurity: No Food Insecurity   Worried About Charity fundraiser in the Last Year: Never true   [place] in the Last Year: Never true  Transportation Needs: No Transportation Needs   Lack of Transportation (Medical): No   Lack of Transportation (Non-Medical): No  Physical Activity: Sufficiently Active   Days of Exercise per Week: 4  days   Minutes of Exercise per Session: 40 min  Stress: No Stress  Concern Present   Feeling of Stress : Not at all  Social Connections: Moderately Isolated   Frequency of Communication with Friends and Family: Three times a week   Frequency of Social Gatherings with Friends and Family: More than three times a week   Attends Religious Services: Never   Marine scientist or Organizations: No   Attends Music therapist: Never   Marital Status: Married    Tobacco Counseling Counseling given: Not Answered   Clinical Intake:  Pre-visit preparation completed: Yes        Nutritional Risks: None Diabetes: No  How often do you need to have someone help you when you read instructions, pamphlets, or other written materials from your doctor or pharmacy?: 1 - Never  Diabetic?  No   Interpreter Needed?: No  Information entered by :: Leroy Kennedy LPN   Activities of Daily Living In your present state of health, do you have any difficulty performing the following activities: 11/06/2020 05/17/2020  Hearing? Y N  Vision? N N  Difficulty concentrating or making decisions? - N  Walking or climbing stairs? Y N  Dressing or bathing? N N  Doing errands, shopping? N N  Preparing Food and eating ? N -  Using the Toilet? N -  In the past six months, have you accidently leaked urine? N -  Do you have problems with loss of bowel control? N -  Managing your Medications? N -  Managing your Finances? N -  Housekeeping or managing your Housekeeping? N -  Some recent data might be hidden    Patient Care Team: [name], MD as PCP - General (Family Medicine) Festus Aloe, MD as Consulting Physician (Urology) Michael Boston, MD as Consulting Physician (General Surgery) [name], MD as Consulting Physician (Cardiology) Jodi Marble, MD as Consulting Physician (Otolaryngology) Deboraha Sprang, MD as Consulting Physician (Cardiology) Luberta Mutter,  MD as Consulting Physician (Ophthalmology) Lamonte Sakai Rose Fillers, MD as Consulting Physician (Pulmonary Disease) Mauri Pole, MD as Consulting Physician (Gastroenterology) [name], [facility] as Pharmacist (Pharmacist)  Indicate any recent Medical Services you may have received from other than [facility] providers in the past year (date may be approximate).     Assessment:   This is a routine wellness examination for [name].  Hearing/Vision screen Hearing Screening - Comments:: Slight hearing loss  Vision Screening - Comments:: Up to date [place]  Dietary issues and exercise activities discussed: Current Exercise Habits: Home exercise routine, Type of exercise: walking (works on his 2 acres everyday), Time (Minutes): 45, Frequency (Times/Week): 5, Weekly Exercise (Minutes/Week): 225, Intensity: Moderate   Goals Addressed             This Visit's Progress    Increase physical activity         Depression Screen PHQ 2/9 Scores 11/06/2020 05/17/2020 11/17/2019 11/01/2019 08/12/2019 05/17/2019 10/29/2018  PHQ - 2 Score 0 0 0 0 0 0 0  PHQ- 9 Score - 0 0 - - 0 0    Fall Risk Fall Risk  11/06/2020 05/17/2020 11/17/2019 11/01/2019 08/12/2019  Falls in the past year? 0 0 0 0 0  Number falls in past yr: 0 0 0 0 0  Injury with Fall? 0 0 0 0 1  Risk Factor Category  - - - - -  Risk for fall due to : - No Fall Risks No Fall Risks - -  Risk for fall due to: Comment - - - - -  Follow up Falls evaluation completed;Falls prevention discussed - - Falls prevention discussed Falls evaluation completed  Comment - - - - -    FALL RISK PREVENTION PERTAINING TO THE HOME:  Any stairs in or around the home? No  If so, are there any without handrails? No  Home free of loose throw rugs in walkways, pet beds, electrical cords, etc? Yes  Adequate lighting in your home to reduce risk of falls? Yes   ASSISTIVE DEVICES UTILIZED TO PREVENT FALLS:  Life alert? No  Use of a cane, walker or w/c? No  Grab bars in  the bathroom? Yes  Shower chair or bench in shower? Yes  Elevated toilet seat or a handicapped toilet? No   TIMED UP AND GO:  Was the test performed? No .    Cognitive Function:  Normal cognitive status assessed by direct observation by this Nurse Health Advisor. No abnormalities found.   MMSE - Mini Mental State Exam 06/11/2017  Orientation to time 5  Orientation to Place 5  Registration 3  Attention/ Calculation 5  Recall 2  Language- name 2 objects 2  Language- repeat 1  Language- follow 3 step command 3  Language- read & follow direction 1  Write a sentence 1  Copy design 1  Total score 29     6CIT Screen 11/01/2019  What Year? 0 points  What month? 0 points  What time? 0 points  Count back from 20 0 points  Months in reverse 0 points  Repeat phrase 0 points  Total Score 0    Immunizations Immunization History  Administered Date(s) Administered   Fluad Quad(high Dose 65+) 09/23/2018, 10/05/2020   Influenza Split 11/11/2010, 11/02/2012, 10/14/2013   Influenza Whole 09/27/2011, 11/13/2011   Influenza, High Dose Seasonal PF 09/26/2015, 10/07/2016   Influenza, Seasonal, Injecte, Preservative Fre 09/23/2018   Influenza,inj,Quad PF,6+ Mos 11/03/2016, 09/20/2017   Influenza-Unspecified 09/26/2014, 10/15/2019   PFIZER Comirnaty(Gray Top)Covid-19 Tri-Sucrose Vaccine 04/24/2020   PFIZER(Purple Top)SARS-COV-2 Vaccination 02/02/2019, 02/22/2019, 10/26/2019, 09/29/2020   Pneumococcal Conjugate-13 06/15/2014   Pneumococcal Polysaccharide-23 03/15/2002, 07/10/2015   Td 08/27/2017   Tdap 04/04/2010   Zoster Recombinat (Shingrix) 03/29/2016   Zoster, Live 05/15/2015    TDAP status: Up to date  Flu Vaccine status: Up to date  Pneumococcal vaccine status: Up to date  Covid-19 vaccine status: Completed vaccines  Qualifies for Shingles Vaccine? No   Zostavax completed Yes   Shingrix Completed?: Yes  Screening Tests Health Maintenance  Topic Date Due   Zoster  Vaccines- Shingrix (2 of 2) 05/24/2016   COLONOSCOPY (Pts 45-59yr Insurance coverage will need to be confirmed)  11/06/2021 (Originally 08/17/2020)   TETANUS/TDAP  08/28/2027   Pneumonia Vaccine 80 Years old  Completed   INFLUENZA VACCINE  Completed   COVID-19 Vaccine  Completed   Hepatitis C Screening  Completed   HPV VACCINES  Aged Out    Health Maintenance  Health Maintenance Due  Topic Date Due   Zoster Vaccines- Shingrix (2 of 2) 05/24/2016    Colorectal cancer screening: Type of screening: Colonoscopy. Completed  . Repeat every will speak to PCP years  Lung Cancer Screening: (Low Dose CT Chest recommended if Age 79-80years, 30 pack-year currently smoking OR have quit w/in 15years.) does not qualify.   Lung Cancer Screening Referral:   Additional Screening:  Hepatitis C Screening: does not qualify; Completed   Vision Screening: Recommended annual ophthalmology exams for early detection of glaucoma and other disorders of the eye. Is the patient  up to date with their annual eye exam?  Yes  Who is the provider or what is the name of the office in which the patient attends annual eye exams? Dr. Brayton El If pt is not established with a provider, would they like to be referred to a provider to establish care? No .   Dental Screening: Recommended annual dental exams for proper oral hygiene  Community Resource Referral / Chronic Care Management: CRR required this visit?  No   CCM required this visit?  No      Plan:     I have personally reviewed and noted the following in the patient's chart:   Medical and social history Use of alcohol, tobacco or illicit drugs  Current medications and supplements including opioid prescriptions. Patient is not currently taking opioid prescriptions. Functional ability and status Nutritional status Physical activity Advanced directives List of other physicians Hospitalizations, surgeries, and ER visits in previous 12  months Vitals Screenings to include cognitive, depression, and falls Referrals and appointments  In addition, I have reviewed and discussed with patient certain preventive protocols, quality metrics, and best practice recommendations. A written personalized care plan for preventive services as well as general preventive health recommendations were provided to patient.     Leroy Kennedy, LPN   [id number]   Nurse Notes:

## 2020-11-06 NOTE — Patient Instructions (Signed)
[name] , Thank you for taking time to come for your Medicare Wellness Visit. I appreciate your ongoing commitment to your health goals. Please review the following plan we discussed and let me know if I can assist you in the future.   Screening recommendations/referrals: Colonoscopy: Up to date Recommended yearly ophthalmology/optometry visit for glaucoma screening and checkup Recommended yearly dental visit for hygiene and checkup  Vaccinations: Influenza vaccine: up to date Pneumococcal vaccine: up to date Tdap vaccine: up to date Shingles vaccine: Education provided    Advanced directives: on file  Conditions/risks identified:   Next appointment: 11-16-2020 @ 10:00 Dr. [name]  Preventive Care 79 Years and Older, Male Preventive care refers to lifestyle choices and visits with your health care provider that can promote health and wellness. What does preventive care include? A yearly physical exam. This is also called an annual well check. Dental exams once or twice a year. Routine eye exams. Ask your health care provider how often you should have your eyes checked. Personal lifestyle choices, including: Daily care of your teeth and gums. Regular physical activity. Eating a healthy diet. Avoiding tobacco and drug use. Limiting alcohol use. Practicing safe sex. Taking low doses of aspirin every day. Taking vitamin and mineral supplements as recommended by your health care provider. What happens during an annual well check? The services and screenings done by your health care provider during your annual well check will depend on your age, overall health, lifestyle risk factors, and family history of disease. Counseling  Your health care provider may ask you questions about your: Alcohol use. Tobacco use. Drug use. Emotional well-being. Home and relationship well-being. Sexual activity. Eating habits. History of falls. Memory and ability to understand  (cognition). Work and work Statistician. Screening  You may have the following tests or measurements: Height, weight, and BMI. Blood pressure. Lipid and cholesterol levels. These may be checked every 5 years, or more frequently if you are over 79 years old. Skin check. Lung cancer screening. You may have this screening every year starting at age 79 if you have a 30-pack-year history of smoking and currently smoke or have quit within the past 15 years. Fecal occult blood test (FOBT) of the stool. You may have this test every year starting at age 79 Flexible sigmoidoscopy or colonoscopy. You may have a sigmoidoscopy every 5 years or a colonoscopy every 10 years starting at age 79 Prostate cancer screening. Recommendations will vary depending on your family history and other risks. Hepatitis C blood test. Hepatitis B blood test. Sexually transmitted disease (STD) testing. Diabetes screening. This is done by checking your blood sugar (glucose) after you have not eaten for a while (fasting). You may have this done every 1-3 years. Abdominal aortic aneurysm (AAA) screening. You may need this if you are a current or former smoker. Osteoporosis. You may be screened starting at age 79 if you are at high risk. Talk with your health care provider about your test results, treatment options, and if necessary, the need for more tests. Vaccines  Your health care provider may recommend certain vaccines, such as: Influenza vaccine. This is recommended every year. Tetanus, diphtheria, and acellular pertussis (Tdap, Td) vaccine. You may need a Td booster every 10 years. Zoster vaccine. You may need this after age 55. Pneumococcal 13-valent conjugate (PCV13) vaccine. One dose is recommended after age 79. Pneumococcal polysaccharide (PPSV23) vaccine. One dose is recommended after age 75. Talk to your health care provider about which screenings and  vaccines you need and how often you need them. This  information is not intended to replace advice given to you by your health care provider. Make sure you discuss any questions you have with your health care provider. Document Released: 01/27/2015 Document Revised: 09/20/2015 Document Reviewed: 11/01/2014 Elsevier Interactive Patient Education  2017 [employer] Prevention in the Home Falls can cause injuries. They can happen to people of all ages. There are many things you can do to make your home safe and to help prevent falls. What can I do on the outside of my home? Regularly fix the edges of walkways and driveways and fix any cracks. Remove anything that might make you trip as you walk through a door, such as a raised step or threshold. Trim any bushes or trees on the path to your home. Use bright outdoor lighting. Clear any walking paths of anything that might make someone trip, such as rocks or tools. Regularly check to see if handrails are loose or broken. Make sure that both sides of any steps have handrails. Any raised decks and porches should have guardrails on the edges. Have any leaves, snow, or ice cleared regularly. Use sand or salt on walking paths during winter. Clean up any spills in your garage right away. This includes oil or grease spills. What can I do in the bathroom? Use night lights. Install grab bars by the toilet and in the tub and shower. Do not use towel bars as grab bars. Use non-skid mats or decals in the tub or shower. If you need to sit down in the shower, use a plastic, non-slip stool. Keep the floor dry. Clean up any water that spills on the floor as soon as it happens. Remove soap buildup in the tub or shower regularly. Attach bath mats securely with double-sided non-slip rug tape. Do not have throw rugs and other things on the floor that can make you trip. What can I do in the bedroom? Use night lights. Make sure that you have a light by your bed that is easy to reach. Do not use any sheets or  blankets that are too big for your bed. They should not hang down onto the floor. Have a firm chair that has side arms. You can use this for support while you get dressed. Do not have throw rugs and other things on the floor that can make you trip. What can I do in the kitchen? Clean up any spills right away. Avoid walking on wet floors. Keep items that you use a lot in easy-to-reach places. If you need to reach something above you, use a strong step stool that has a grab bar. Keep electrical cords out of the way. Do not use floor polish or wax that makes floors slippery. If you must use wax, use non-skid floor wax. Do not have throw rugs and other things on the floor that can make you trip. What can I do with my stairs? Do not leave any items on the stairs. Make sure that there are handrails on both sides of the stairs and use them. Fix handrails that are broken or loose. Make sure that handrails are as long as the stairways. Check any carpeting to make sure that it is firmly attached to the stairs. Fix any carpet that is loose or worn. Avoid having throw rugs at the top or bottom of the stairs. If you do have throw rugs, attach them to the floor with carpet tape. Make  sure that you have a light switch at the top of the stairs and the bottom of the stairs. If you do not have them, ask someone to add them for you. What else can I do to help prevent falls? Wear shoes that: Do not have high heels. Have rubber bottoms. Are comfortable and fit you well. Are closed at the toe. Do not wear sandals. If you use a stepladder: Make sure that it is fully opened. Do not climb a closed stepladder. Make sure that both sides of the stepladder are locked into place. Ask someone to hold it for you, if possible. Clearly mark and make sure that you can see: Any grab bars or handrails. First and last steps. Where the edge of each step is. Use tools that help you move around (mobility aids) if they are  needed. These include: Canes. Walkers. Scooters. Crutches. Turn on the lights when you go into a dark area. Replace any light bulbs as soon as they burn out. Set up your furniture so you have a clear path. Avoid moving your furniture around. If any of your floors are uneven, fix them. If there are any pets around you, be aware of where they are. Review your medicines with your doctor. Some medicines can make you feel dizzy. This can increase your chance of falling. Ask your doctor what other things that you can do to help prevent falls. This information is not intended to replace advice given to you by your health care provider. Make sure you discuss any questions you have with your health care provider. Document Released: 10/27/2008 Document Revised: 06/08/2015 Document Reviewed: 02/04/2014 Elsevier Interactive Patient Education  2017 [employer].

## 2020-11-08 ENCOUNTER — Other Ambulatory Visit: Payer: Self-pay

## 2020-11-08 ENCOUNTER — Ambulatory Visit (INDEPENDENT_AMBULATORY_CARE_PROVIDER_SITE_OTHER): Payer: Medicare Other | Admitting: Internal Medicine

## 2020-11-08 ENCOUNTER — Ambulatory Visit: Payer: Medicare Other | Admitting: Internal Medicine

## 2020-11-08 ENCOUNTER — Encounter: Payer: Self-pay | Admitting: Internal Medicine

## 2020-11-08 VITALS — BP 114/68 | HR 70 | Temp 97.8°F | Ht 72.0 in | Wt 209.0 lb

## 2020-11-08 DIAGNOSIS — J84112 Idiopathic pulmonary fibrosis: Secondary | ICD-10-CM | POA: Diagnosis not present

## 2020-11-08 DIAGNOSIS — I255 Ischemic cardiomyopathy: Secondary | ICD-10-CM

## 2020-11-08 DIAGNOSIS — J439 Emphysema, unspecified: Secondary | ICD-10-CM | POA: Diagnosis not present

## 2020-11-08 DIAGNOSIS — J8410 Pulmonary fibrosis, unspecified: Secondary | ICD-10-CM | POA: Diagnosis not present

## 2020-11-08 NOTE — Progress Notes (Signed)
ROV 02/19/18 --80 year old man who follows up today for mixed obstruction and restriction, COPD as well as stable bilateral lower lobe interstitial disease.  We questioned possible amiodarone toxicity and this was stopped.  He had a repeat high-resolution CT scan of the chest on 02/17/2018 that I reviewed.  This shows some mild emphysematous change irregular peripheral interstitial disease with some mild associated bronchiectasis, no significant honeycomb change.  Unchanged compared with his prior 1 year ago.  Pulmonary function testing done on 02/09/2018 shows mild mixed restriction and obstruction, overall stable compared with his prior 1 year ago.        ROV 04/23/19 --[name] is 60 and has a history of mixed obstruction and restriction on pulmonary function testing, COPD as well as stable bilateral lower lobe interstitial disease.  His last CT scan was 02/17/2018 (stable).  He was previously on amiodarone, this has been stopped for several years.  Currently managed on Symbicort, is using albuterol very rarely He reports that he is having some imbalance, feels unstable w ambulation, has had some falls.  He is having more cough, mucous production over the last several months. He has a lot of nasal gtt - uses Atrovent NS with improvement. He is on flonase daily, loratadine.   Chest x-ray from 04/15/2019 reviewed by me, shows persistent interstitial markings particularly in the right midlung. No significant change or increase noted when comparing with prior films.   ROV 09/27/20 --follow-up visit 80 year old gentleman whom we have followed for mixed obstruction and restriction due to COPD and also bilateral lower lobe interstitial disease question amiodarone toxicity.  Autoimmune panel in October 2017 was negative.  He is now in atrial fibrillation almost all the time, notices that his dyspnea is associated with tachycardia.  Currently managed on Symbicort, albuterol which he is using 2-3x a month.   He is having a lot of cough and nasal congestion / drainage. He wonders if the flonase is part of the problem, was told to do atrovent up to tid to see if he gets benefit.   Most recent CT chest performed 09/22/2020 reviewed by me, shows persistent patchy groundglass attenuation and septal thickening in the subpleural regions without any definite honeycomb, possibly slightly progressed compared with 02/17/2018.  Pulmonary function testing performed today and reviewed by me, showed mixed restriction and obstruction with an FEV1 of 1.9 865% predicted and no bronchodilator response.  Significant decrease in FEV1 compared with 02/09/2018.  Restricted lung volumes, decreased diffusion capacity that does correct when adjusted for his alveolar volume.  OV 10/05/2020 -transfer of care to the ILD center with Dr. Chase Caller.  Patient being referred by Dr. Baltazar Apo  Subjective:  Patient ID: [name], male , DOB: 1940-05-13 , age 53 y.o. , MRN: [id number] , ADDRESS: [place] [id number] PCP Joel Minium, MD Patient Care Team: Joel Minium, MD as PCP - General (Family Medicine) Festus Aloe, MD as Consulting Physician (Urology) Michael Boston, MD as Consulting Physician (General Surgery) Stanford Breed, Denice Bors, MD as Consulting Physician (Cardiology) Jodi Marble, MD as Consulting Physician (Otolaryngology) Deboraha Sprang, MD as Consulting Physician (Cardiology) Luberta Mutter, MD as Consulting Physician (Ophthalmology) Lamonte Sakai Rose Fillers, MD as Consulting Physician (Pulmonary Disease) Mauri Pole, MD as Consulting Physician (Gastroenterology) [name], [facility] as Pharmacist (Pharmacist)  This Provider for this visit: Treatment Team:  Attending Provider: Brand Males, MD    10/05/2020 -   Chief  Complaint  Patient presents with   Follow-up    Pt states that he does have complaints of SOB that can happen at any time but is worse with exertion.      HPI [name] 80 y.o. -referred by Dr. Baltazar Apo.  History is provided by the patient and also review of the medical records.  Patient tells me he is aware that he has ILD.  He has a significant symptom burden.  He says he is recently been told his ILD is gotten worse.  He was on amiodarone for many years.  Then approximately 18 months ago he stopped it because he got worried about the drug.  Dr. Lamonte Sakai notes indicate that patient has combined emphysema and interstitial lung disease.  His pulmonary function test profile shows 20% decline in FVC in the last 5 years or so with most of it coming in the last 2 years.  High-resolution CT chest only shows some decline.  High-resolution CT chest personally visualized by me and is probable UIP.  His serologies are negative.  He is a former smoker having quit over 40 years ago.  He is Caucasian and his age is greater than 46.  Male gender.  He definitely has progressive symptoms.  He really wants a good symptom control.  He also wants understand his disease.  Today most of the focus was spent on helping him understand interstitial lung disease.  I explicitly told him that I would not be able to address symptom burden today.  He will also switch his comprehensive pulmonary care to see Dr. Chase Caller myself.  He has had an MI in the past.  His atrial fibrillation is on Eliquis.  He is no longer on amiodarone.  His walking desaturation test did not show desaturation more than 3 points.   CT Chest data Sept 2022  Narrative & Impression  CLINICAL DATA:  80 year old male with history of COPD. Evaluate for interstitial lung disease.   EXAM: CT CHEST WITHOUT CONTRAST   TECHNIQUE: Multidetector CT imaging of the chest was performed following the standard protocol without intravenous contrast. High resolution imaging of the lungs, as well as inspiratory and expiratory imaging, was performed.   COMPARISON:  Chest CT 02/17/2018.    FINDINGS: Cardiovascular: Heart size is normal. There is no significant pericardial fluid, thickening or pericardial calcification. There is aortic atherosclerosis, as well as atherosclerosis of the great vessels of the mediastinum and the coronary arteries, including calcified atherosclerotic plaque in the left main, left anterior descending, left circumflex and right coronary arteries. Calcifications of the aortic valve. Curvilinear calcifications associated with the myocardium of the inferior wall of the left ventricle in the mid ventricle and base, likely sequela of prior left circumflex coronary artery territory myocardial infarction(s). Left-sided biventricular pacemaker/AICD with lead tips terminating in the right atrial appendage, right ventricular apex, and overlying the lateral wall the left ventricle via the coronary sinus and coronary veins.   Mediastinum/Nodes: No pathologically enlarged mediastinal or hilar lymph nodes. Please note that accurate exclusion of hilar adenopathy is limited on noncontrast CT scans. Esophagus is unremarkable in appearance. No axillary lymphadenopathy.   Lungs/Pleura: High-resolution images again demonstrate some patchy areas of ground-glass attenuation, septal thickening, subpleural reticulation, mild traction bronchiectasis and peripheral bronchiolectasis in the lungs bilaterally. No definite honeycombing. These findings have a mild craniocaudal gradient. Inspiratory and expiratory imaging is unremarkable. No acute consolidative airspace disease. No pleural effusions. Chronic elevation of the right hemidiaphragm is again noted. No definite  suspicious appearing pulmonary nodules or masses are noted.   Upper Abdomen: Low-attenuation lesions are noted in the left lobe of the liver, incompletely characterized on today's non-contrast CT examination, but similar to the prior study and measuring up to 1.3 cm in diameter in segment 2,  statistically likely to represent cysts.   Musculoskeletal: There are no aggressive appearing lytic or blastic lesions noted in the visualized portions of the skeleton.   IMPRESSION: 1. The appearance of the lungs is compatible with interstitial lung disease, categorized as probable usual interstitial pneumonia (UIP) per current ATS guidelines. Findings appear mildly progressive compared to the prior study. 2. Aortic atherosclerosis, in addition to left main and 3 vessel coronary artery disease. Assessment for potential risk factor modification, dietary therapy or pharmacologic therapy may be warranted, if clinically indicated. 3. There are calcifications of the aortic valve. Echocardiographic correlation for evaluation of potential valvular dysfunction may be warranted if clinically indicated.   Aortic Atherosclerosis (ICD10-I70.0).     Electronically Signed   By: Vinnie Langton M.D.   On: 09/22/2020 11:28   No results found.  Simple office walk 185 feet x  3 laps goal with forehead probe 10/05/2020   O2 used ra  Number laps completed 3  Comments about pace avg  Resting Pulse Ox/HR 100% and 70/min  Final Pulse Ox/HR 97% and 82/min  Desaturated </= 88% no  Desaturated <= 3% points yes  Got Tachycardic >/= 90/min no  Symptoms at end of test Mild dyspnea  Miscellaneous comments x      Results for KAMAR, CALLENDER (MRN [id number]) as of 10/05/2020 11:51  Ref. Range 11/14/2015 11:52 09/27/2020 12:19 09/27/2020 13:46  Anti Nuclear Antibody (ANA) Latest Ref Range: NEGATIVE  NEG  NEGATIVE  Anti JO-1 Latest Ref Range: 0.0 - 0.9 AI <8.2    Cyclic Citrullin Peptide Ab Latest Units: UNITS <16  <16  ds DNA Ab Latest Units: IU/mL 1    RA Latex Turbid. Latest Ref Range: <14 IU/mL <14 <14 <14  ENA SM Ab Ser-aCnc Latest Ref Range: <1.0 NEG AI  <1.0 NEG    ANA,IFA RA DIAG PNL W/RFLX TIT/PATN Unknown   Rpt  Ribonucleic Protein(ENA) Antibody, IgG Latest Ref Range: <1.0 NEG AI  <1.0 NEG     SSA (Ro) (ENA) Antibody, IgG Latest Ref Range: <1.0 NEG AI <1.0 NEG <1.0 NEG   SSB (La) (ENA) Antibody, IgG Latest Ref Range: <1.0 NEG AI <1.0 NEG <1.0 NEG   Scleroderma (Scl-70) (ENA) Antibody, IgG Latest Ref Range: <1.0 NEG AI  <1.0 NEG      OV 11/08/2020  Subjective:  Patient ID: [name], male , DOB: [birth year] , age 6 y.o. , MRN: [id number] , ADDRESS: [place] [id number] PCP Joel Minium, MD Patient Care Team: Joel Minium, MD as PCP - General (Family Medicine) Festus Aloe, MD as Consulting Physician (Urology) Michael Boston, MD as Consulting Physician (General Surgery) Stanford Breed Denice Bors, MD as Consulting Physician (Cardiology) Jodi Marble, MD as Consulting Physician (Otolaryngology) Deboraha Sprang, MD as Consulting Physician (Cardiology) Luberta Mutter, MD as Consulting Physician (Ophthalmology) Lamonte Sakai Rose Fillers, MD as Consulting Physician (Pulmonary Disease) Mauri Pole, MD as Consulting Physician (Gastroenterology) [name], [facility] as Pharmacist (Pharmacist)  This Provider for this visit: Treatment Team:  Attending Provider: Brand Males, MD    11/08/2020 -   Chief Complaint  Patient presents with   Follow-up    Pt states that he has been worried  about taking the Esbriet and states that he has not started taking it yet. States his breathing is about the same.     HPI WAYNE WICKLUND 80 y.o. - here with wife to discuss disease, Wife is 75 years old. Her name is Myrnal She was a clinical trials coordinator in the late 1990s at [employer] and was involved in oncology and sildenafil trials. Lot of questions on type of fibrosis (IPF), natural course (few to several  yearS), quality of life expectations, anti fibrotics . Wants symptom relief - referred to rehab. HE is interested in support group - gave him contact number. Risk factor - prior smoking and amio. He has not started esbriet yet. Will do on 11/14/20 . This  is becase he and wife went to  reunion. Ate fried food and got diarrhea. Now resolved.    Integrated Comprehensive ILD Questionnaire  Symptoms:  SYMPTOM SCALE - ILD 11/08/2020  Current weight 209#  O2 use ra  Shortness of Breath 0 -> 5 scale with 5 being worst (score 6 If unable to do)  At rest 0  0Simple tasks - showers, clothes change, eating, shaving 0  Household (dishes, doing bed, laundry) 0  Shopping 3  Walking level at own pace 4  Walking up Stairs 5  Total (30-36) Dyspnea Score 12 x 2 years  same Since onset  How bad is your cough? moderate x  2years. Getting worse. Has light yellow/green sputum. Clears throat  How bad is your fatigue light  How bad is nausea 0  How bad is vomiting?  0  How bad is diarrhea? 0  How bad is anxiety? 0  How bad is depression 0  Any chronic pain - if so where and how bad 0       Past Medical History :  Also copd, cad, chf nos, osa - not using cPAP. Hx of pna > 10 years ago. Hx of renal sones + Has had 5 mRNA covid shots. Last Sept 2022. Never had covid   ROS:  Fatigue + Dry eyes + Gerd +  FAMILY HISTORY of LUNG DISEASE:  denes  PERSONAL EXPOSURE HISTORY:  Smoker [phone]. NOt smoked in 49  years  HOME  EXPOSURE and HOBBY DETAILS :  [place] yers[place] in [place]. 2 of his tile floors have mildew. Not used jacuzzi in 10 years. 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    PFT  PFT Results Latest Ref Rng & Units 09/27/2020 02/09/2018 03/12/2017 02/08/2015  FVC-Pre L 2.34 3.02 2.82 3.20  FVC-Predicted Pre % 55 69 64 71  FVC-Post L 2.44 2.97 2.78 3.19  FVC-Predicted Post % 57 67 63 71  Pre FEV1/FVC % % 84 82 82 82  Post FEV1/FCV %  % 82 82 83 82  FEV1-Pre L 1.98 2.48 2.30 2.61  FEV1-Predicted Pre % 65 78 72 80  FEV1-Post L 2.00 2.42 2.30 2.61  DLCO uncorrected ml/min/mmHg 14.62 18.10 18.46 19.06  DLCO UNC% % 57 53 54 56  DLCO corrected ml/min/mmHg 14.62 17.90 - -  DLCO COR %Predicted % 57 53 - -  DLVA Predicted % 101 90 95 90  TLC L  4.17 4.65 4.94 4.59  TLC % Predicted % 57 64 68 63  RV % Predicted % 46 56 68 44       has a past medical history of Adenomatous colon polyp (2009; 06/2012), AICD (automatic cardioverter/defibrillator) present, Atrial fibrillation ([place]), Bilateral sensorineural hearing loss, BPH (benign prostatic hypertrophy), CAD (coronary artery disease), CHF (congestive heart failure) ([place]), Chronic constipation, Chronic rhinitis, COPD (chronic obstructive pulmonary disease) ([place]), Diverticulosis of colon (2005), Gallstones (06/2014), GERD (gastroesophageal reflux disease), Hepatic steatosis (2010; 2016), Hyperlipidemia, Hypertension, Ischemic cardiomyopathy, Microscopic hematuria, Nephrolithiasis, Obstructive sleep apnea, Presence of permanent cardiac pacemaker, Squamous cell carcinoma of back, and Ventricular tachycardia.   reports that he quit smoking about 40 years ago. His smoking use included cigarettes. He has a 30.00 pack-year smoking history. He has never used smokeless tobacco.  Past Surgical History:  Procedure Laterality Date   ATRIAL ABLATION SURGERY     flutter ablation 2002   BREAST SURGERY Left 1990   "Tissue removed from breast"   CARDIAC CATHETERIZATION     CARDIOVERSION N/A 05/10/2020   Procedure: CARDIOVERSION;  Surgeon: Sanda Klein, MD;  Location: [facility];  Service: Cardiovascular;  Laterality: N/A;   carotid doppler  09/25/12   No signif stenosis   COLONOSCOPY N/A 07/13/2012   Diverticula ascending colon.  Polyp x 1-recall 5 yrs (Dr. Watt Climes) Procedure: COLONOSCOPY;  Surgeon: Jeryl Columbia, MD;  Location: [facility];  Service: Endoscopy;  Laterality: N/A; polypectomy x 1     COLONOSCOPY WITH PROPOFOL N/A 08/18/2015   Procedure: COLONOSCOPY WITH PROPOFOL;  Surgeon: Mauri Pole, MD;  Location: [facility];  Service: Endoscopy;  Laterality: N/A;   ESOPHAGOGASTRODUODENOSCOPY N/A 07/13/2012   Procedure: ESOPHAGOGASTRODUODENOSCOPY (EGD);  Surgeon: Jeryl Columbia, MD;  Location: [facility];  Service: Endoscopy;  Laterality: N/A;  NORMAL   EXTRACORPOREAL SHOCK WAVE LITHOTRIPSY     HEMORRHOID BANDING     ICD     a) Guidant Contak H170- Virl Axe, MD 07/25/06 (second device) b) Medtronic CRT-D   ICD GENERATOR CHANGEOUT N/A 02/14/2020   Procedure: ICD GENERATOR CHANGEOUT;  Surgeon: Deboraha Sprang, MD;  Location: [facility];  Service: Cardiovascular;  Laterality: N/A;   INGUINAL HERNIA REPAIR Bilateral 1975   LEAD REVISION Bilateral 12/26/2011   Procedure: LEAD REVISION;  Surgeon: Evans Lance, MD;  Location: [facility] CATH LAB;  Service: Cardiovascular;  Laterality: Bilateral;   PFTs  02/2015   Mod restriction (interstitial), mod diffusion defect (Dr. Annamaria Boots)   precancer area keratsis removed  05/2015    leftf orehead and left arm and right arm and back    Allergies  Allergen Reactions   Sulfa Antibiotics Rash    Immunization History  Administered Date(s) Administered   Fluad Quad(high Dose 65+) 09/23/2018, 10/05/2020   Influenza Split 11/11/2010, 11/02/2012, 10/14/2013   Influenza Whole 09/27/2011, 11/13/2011   Influenza, High Dose Seasonal PF 09/26/2015, 10/07/2016   Influenza, Seasonal, Injecte, Preservative Fre 09/23/2018   Influenza,inj,Quad PF,6+ Mos 11/03/2016, 09/20/2017   Influenza-Unspecified 09/26/2014, 10/15/2019   PFIZER Comirnaty(Gray Top)Covid-19 Tri-Sucrose Vaccine 04/24/2020   PFIZER(Purple Top)SARS-COV-2 Vaccination 02/02/2019, 02/22/2019, 10/26/2019, 09/29/2020   Pneumococcal Conjugate-13 06/15/2014   Pneumococcal Polysaccharide-23 03/15/2002, 07/10/2015   Td 08/27/2017   Tdap 04/04/2010   Zoster Recombinat (Shingrix) 03/29/2016    Zoster, Live 05/15/2015    Family History  Problem Relation Age of Onset   Heart disease Mother    Other Mother        colon surgery for fistula   Gallstones  Mother    Breast cancer Maternal Grandmother    Coronary artery disease Father        family hx of   Heart attack Father        4 stents/ pacemaker   Nephrolithiasis Father    Hypertension Sister    Goiter Paternal Grandmother    Nephrolithiasis Son    Colon cancer Neg Hx      Current Outpatient Medications:    antiseptic oral rinse (BIOTENE) LIQD, 15 mLs by Mouth Rinse route in the morning and at bedtime., Disp: , Rfl:    Budeson-Glycopyrrol-Formoterol (BREZTRI AEROSPHERE) 160-9-4.8 MCG/ACT AERO, Inhale 2 puffs into the lungs in the morning and at bedtime., Disp: 5.9 g, Rfl: 5   carvedilol (COREG) 6.25 MG tablet, TAKE 1 TABLET(6.25 MG) BY MOUTH TWICE DAILY WITH A MEAL, Disp: 180 tablet, Rfl: 3   cholecalciferol (VITAMIN D) 1000 units tablet, Take 1,000 Units by mouth every evening., Disp: , Rfl:    eplerenone (INSPRA) 25 MG tablet, Take 1 tablet (25 mg total) by mouth daily., Disp: 30 tablet, Rfl: 11   furosemide (LASIX) 40 MG tablet, TAKE 2 TABLETS BY MOUTH EVERY DAY, Disp: 90 tablet, Rfl: 3   ipratropium (ATROVENT) 0.06 % nasal spray, Place 2 sprays into both nostrils 3 (three) times daily., Disp: 15 mL, Rfl: 1   levalbuterol (XOPENEX HFA) 45 MCG/ACT inhaler, Inhale 2 puffs into the lungs every 4 (four) hours as needed for wheezing., Disp: 1 each, Rfl: 12   loratadine (CLARITIN) 10 MG tablet, Take 10 mg by mouth in the morning., Disp: , Rfl:    Magnesium 250 MG TABS, Take 250 mg by mouth every evening., Disp: , Rfl:    mexiletine (MEXITIL) 150 MG capsule, TAKE 2 CAPSULES(300 MG) BY MOUTH TWICE DAILY, Disp: 360 capsule, Rfl: 2   Multiple Vitamin (MULTIVITAMIN WITH MINERALS) TABS, Take 1 tablet by mouth every evening., Disp: , Rfl:    nitroGLYCERIN (NITROSTAT) 0.4 MG SL tablet, DISSOLVE 1 TABLET UNDER THE TONGUE EVERY 5  MINUTES AS NEEDED FOR CHEST PAIN (Patient taking differently: Place 0.4 mg under the tongue every 5 (five) minutes x 3 doses as needed for chest pain.), Disp: 25 tablet, Rfl: 5   polyvinyl alcohol-povidone (HYPOTEARS) 1.4-0.6 % ophthalmic solution, Place 1-2 drops into both eyes in the morning and at bedtime., Disp: , Rfl:    potassium chloride SA (KLOR-CON) 20 MEQ tablet, Take 1 tablet (20 mEq total) by mouth daily with lunch., Disp: 90 tablet, Rfl: 2   prednisoLONE acetate (PRED FORTE) 1 % ophthalmic suspension, Place 1 drop into the left eye in the morning., Disp: , Rfl:    Probiotic Product (PROBIOTIC DAILY PO), Take 1 capsule by mouth every evening., Disp: , Rfl:    rivaroxaban (XARELTO) 20 MG TABS tablet, TAKE 1 TABLET(20 MG) BY MOUTH DAILY WITH SUPPER, Disp: 90 tablet, Rfl: 1   rosuvastatin (CRESTOR) 40 MG tablet, Take 1 tablet (40 mg total) by mouth every evening., Disp: 90 tablet, Rfl: 2   sacubitril-valsartan (ENTRESTO) 24-26 MG, TAKE 1 TABLET BY MOUTH TWICE DAILY, Disp: 180 tablet, Rfl: 0   triamcinolone (KENALOG) 0.1 %, Apply 1 application topically 2 (two) times daily as needed (skin irritation)., Disp: , Rfl:    Wheat Dextrin (BENEFIBER PO), Take 2 Scoops by mouth 2 (two) times daily., Disp: , Rfl:    XOPENEX HFA 45 MCG/ACT inhaler, Inhale 1-2 puffs into the lungs every 4 (four) hours as needed for wheezing., Disp: 1 each, Rfl:  11   Pirfenidone (ESBRIET) 267 MG TABS, Take 1 tab three times daily for 7 days, then 2 tabs three times daily for 7 days, then 3 tabs three times daily thereafter. (Patient not taking: Reported on 11/08/2020), Disp: 207 tablet, Rfl: 0   [START ON 12/06/2020] Pirfenidone (ESBRIET) 267 MG TABS, Take 3 tablets (801 mg total) by mouth with breakfast, with lunch, and with evening meal. (Patient not taking: Reported on 11/08/2020), Disp: 270 tablet, Rfl: 1      Objective:   Vitals:   11/08/20 1435  BP: 114/68  Pulse: 70  Temp: 97.8 F (36.6 C)  TempSrc: Oral   SpO2: 97%  Weight: 209 lb (94.8 kg)  Height: 6' (1.829 m)    Estimated body mass index is 28.35 kg/m as calculated from the following:   Height as of this encounter: 6' (1.829 m).   Weight as of this encounter: 209 lb (94.8 kg).  _0 @  Filed Weights   11/08/20 1435  Weight: 209 lb (94.8 kg)     Physical Exam Discussion only visit      Assessment:       ICD-10-CM   1. IPF (idiopathic pulmonary fibrosis) (HCC)  J84.112 AMB referral to pulmonary rehabilitation    2. Pulmonary emphysema with fibrosis of lung (HCC)  J43.9 AMB referral to pulmonary rehabilitation   J84.10          Plan:     Patient Instructions  Pulmnary Fibrosis with emphysema ILD (interstitial lung disease) ([place]) IPF (idiopathic pulmonary fibrosis) (HCC)  -  diagnosis is IPF - a specific varriety of pulmonary fibrosis - diagnosis being given based on the features on CT scan ["probable UIP], progression, age greater than 33, male gender, Caucasian ethnicity, former smoker and also amiodarone as a risk factor with serologies being negative  -The general nature of IPF or progressive pulmonary fibrosis is that of progression.  The natural history of IPF is that most patients will expire in few to several years  -Antifibrotic's can help slow down the progression and is strongly recommended for you  -Clinical trials is also a care option but can be discussed in the future  Plan - start esbiret per protocol 11/14/20  - refer pulmonary rehab  - email Marlane Mingle [email or web address] - support group leader  - clinical trials in future  Chronic obstructive pulmonary disease, unspecified COPD type ([place])  - not in flare up  Plan  - cotninue breztri daily - albueo as needed  Cough Dyspnea on exertion Post nasal drip  YOu have lot of symptoms  Plan - -refer pulmonary rehab to help dyspnea - Not addressing other symptoms for the moment  - continue regular medications as of  now    [facility] (make all appointments now)  - 2 weeks with app or Dr Chase Caller for video visit to ensure proper uptake with esbriet -4 weeks do LFT and also visit with Dr. Chase Caller or APP for a 30-minute face-to-face encounter - 6-8 weeks telephone or video visit with Dr Chase Caller or APP to ensure proper uptake with esbriet  -10 -12 weeks do spirometry and dlco - 10 -12 weeks face to face with Dr Chase Caller  - 30 min   ( Level 05 visit: Estb 40-54 min    in  visit type: on-site physical face to visit  in total care time and counseling or/and coordination of care by this undersigned MD - Dr Brand Males. This includes one or more of the following on this  same day 11/08/2020: pre-charting, chart review, note writing, documentation discussion of test results, diagnostic or treatment recommendations, prognosis, risks and benefits of management options, instructions, education, compliance or risk-factor reduction. It excludes time spent by the [place] or office staff in the care of the patient. Actual time 50 min)   SIGNATURE    Dr. Brand Males, M.D., F.C.C.P,  Pulmonary and Critical Care Medicine Staff Physician, [place] Director - Interstitial Lung Disease  Program  [facility] [place], [place], [address]  Pager: [phone], If no answer or between  15:00h - 7:00h: call [phone] Telephone: [phone]  11:03 PM 11/08/2020

## 2020-11-08 NOTE — Patient Instructions (Addendum)
Pulmnary Fibrosis with emphysema ILD (interstitial lung disease) (HCC) IPF (idiopathic pulmonary fibrosis) (HCC)  -  diagnosis is IPF - a specific varriety of pulmonary fibrosis - diagnosis being given based on the features on CT scan ["probable UIP], progression, age greater than 14, male gender, Caucasian ethnicity, former smoker and also amiodarone as a risk factor with serologies being negative  -The general nature of IPF or progressive pulmonary fibrosis is that of progression.  The natural history of IPF is that most patients will expire in few to several years  -Antifibrotic's can help slow down the progression and is strongly recommended for you  -Clinical trials is also a care option but can be discussed in the future  Plan - start esbiret per protocol 11/14/20  - refer pulmonary rehab  - email Marlane Mingle [email or web address] - support group leader  - clinical trials in future  Chronic obstructive pulmonary disease, unspecified COPD type ([place])  - not in flare up  Plan  - cotninue breztri daily - albueo as needed  Cough Dyspnea on exertion Post nasal drip  YOu have lot of symptoms  Plan - -refer pulmonary rehab to help dyspnea - Not addressing other symptoms for the moment  - continue regular medications as of now    [facility] (make all appointments now)  - 2 weeks with app or Dr Chase Caller for video visit to ensure proper uptake with esbriet -4 weeks do LFT and also visit with Dr. Chase Caller or APP for a 30-minute face-to-face encounter - 6-8 weeks telephone or video visit with Dr Chase Caller or APP to ensure proper uptake with esbriet  -10 -12 weeks do spirometry and dlco - 10 -12 weeks face to face with Dr Chase Caller  - 30 min

## 2020-11-10 ENCOUNTER — Telehealth: Payer: Self-pay | Admitting: *Deleted

## 2020-11-10 NOTE — Telephone Encounter (Signed)
Left message for pt to call, he had dropped off paperwork regarding a new medication they want him to start for his pulmonary fibrosis, esbriet. Per review of the pharmacist here in our office there is an interaction between this medication and mexiletine. Because of the interaction the dose of the esbriet can not go above 534 mg TID.

## 2020-11-10 NOTE — Telephone Encounter (Signed)
Spoke with pt, he reports he was given the 267 mg tablets. He will take 1 tablet TID for 7 days and then increase to 2 tablets TID = 534 mg TID and then he is to increase to 3 tablets TID. Aware due to the interaction he should not increase to the 3 tablets TID. Aware will forward this message to pulmonary to make them aware.

## 2020-11-13 ENCOUNTER — Telehealth: Payer: Self-pay | Admitting: Pharmacist

## 2020-11-13 DIAGNOSIS — J84112 Idiopathic pulmonary fibrosis: Secondary | ICD-10-CM

## 2020-11-13 DIAGNOSIS — J441 Chronic obstructive pulmonary disease with (acute) exacerbation: Secondary | ICD-10-CM | POA: Diagnosis not present

## 2020-11-13 DIAGNOSIS — I5022 Chronic systolic (congestive) heart failure: Secondary | ICD-10-CM | POA: Diagnosis not present

## 2020-11-13 DIAGNOSIS — I4821 Permanent atrial fibrillation: Secondary | ICD-10-CM | POA: Diagnosis not present

## 2020-11-13 MED ORDER — PIRFENIDONE 267 MG PO TABS: 534.0000 mg | ORAL_TABLET | Freq: Three times a day (TID) | ORAL | 1 refills | Status: DC

## 2020-11-13 NOTE — Telephone Encounter (Signed)
That is correct - max is 2tab tid for esbriet for him

## 2020-11-13 NOTE — Telephone Encounter (Signed)
Rx for Esbriet 552m three times daily sent to [facility] as maintenance dose. Called patient to advise - left VM  DKnox Saliva PharmD, MPH, BCPS Clinical Pharmacist (Rheumatology and Pulmonology)

## 2020-11-13 NOTE — Telephone Encounter (Signed)
Patient returned call. Discussed staying at reduced dose of Esbriet at 534 mg three times daily.  Patient planning to start [place] on 11/14/20  Patient advised that new rx was sent to [facility] for maintenance dose.  Knox Saliva, PharmD, MPH, BCPS Clinical Pharmacist (Rheumatology and Pulmonology)

## 2020-11-16 ENCOUNTER — Ambulatory Visit (INDEPENDENT_AMBULATORY_CARE_PROVIDER_SITE_OTHER): Payer: Medicare Other | Admitting: Family Medicine

## 2020-11-16 ENCOUNTER — Other Ambulatory Visit: Payer: Self-pay

## 2020-11-16 ENCOUNTER — Encounter: Payer: Self-pay | Admitting: Family Medicine

## 2020-11-16 ENCOUNTER — Telehealth (HOSPITAL_COMMUNITY): Payer: Self-pay

## 2020-11-16 VITALS — BP 100/70 | HR 83 | Temp 96.8°F | Resp 16 | Ht 72.0 in | Wt 208.4 lb

## 2020-11-16 DIAGNOSIS — I255 Ischemic cardiomyopathy: Secondary | ICD-10-CM | POA: Diagnosis not present

## 2020-11-16 DIAGNOSIS — J849 Interstitial pulmonary disease, unspecified: Secondary | ICD-10-CM

## 2020-11-16 DIAGNOSIS — I10 Essential (primary) hypertension: Secondary | ICD-10-CM

## 2020-11-16 DIAGNOSIS — Z125 Encounter for screening for malignant neoplasm of prostate: Secondary | ICD-10-CM | POA: Diagnosis not present

## 2020-11-16 DIAGNOSIS — J84112 Idiopathic pulmonary fibrosis: Secondary | ICD-10-CM

## 2020-11-16 DIAGNOSIS — E559 Vitamin D deficiency, unspecified: Secondary | ICD-10-CM

## 2020-11-16 DIAGNOSIS — E7800 Pure hypercholesterolemia, unspecified: Secondary | ICD-10-CM

## 2020-11-16 LAB — HEPATIC FUNCTION PANEL
ALT: 37 U/L (ref 0–53)
AST: 33 U/L (ref 0–37)
Albumin: 4 g/dL (ref 3.5–5.2)
Alkaline Phosphatase: 50 U/L (ref 39–117)
Bilirubin, Direct: 0.1 mg/dL (ref 0.0–0.3)
Total Bilirubin: 0.7 mg/dL (ref 0.2–1.2)
Total Protein: 6.4 g/dL (ref 6.0–8.3)

## 2020-11-16 LAB — CBC WITH DIFFERENTIAL/PLATELET
Basophils Absolute: 0.1 10*3/uL (ref 0.0–0.1)
Basophils Relative: 0.8 % (ref 0.0–3.0)
Eosinophils Absolute: 0.4 10*3/uL (ref 0.0–0.7)
Eosinophils Relative: 4.8 % (ref 0.0–5.0)
HCT: 44.7 % (ref 39.0–52.0)
Hemoglobin: 15.1 g/dL (ref 13.0–17.0)
Lymphocytes Relative: 29.9 % (ref 12.0–46.0)
Lymphs Abs: 2.2 10*3/uL (ref 0.7–4.0)
MCHC: 33.7 g/dL (ref 30.0–36.0)
MCV: 101.6 fl — ABNORMAL HIGH (ref 78.0–100.0)
Monocytes Absolute: 0.6 10*3/uL (ref 0.1–1.0)
Monocytes Relative: 8.5 % (ref 3.0–12.0)
Neutro Abs: 4.1 10*3/uL (ref 1.4–7.7)
Neutrophils Relative %: 56 % (ref 43.0–77.0)
Platelets: 280 10*3/uL (ref 150.0–400.0)
RBC: 4.4 Mil/uL (ref 4.22–5.81)
RDW: 14.2 % (ref 11.5–15.5)
WBC: 7.3 10*3/uL (ref 4.0–10.5)

## 2020-11-16 LAB — BASIC METABOLIC PANEL
BUN: 18 mg/dL (ref 6–23)
CO2: 28 mEq/L (ref 19–32)
Calcium: 9.3 mg/dL (ref 8.4–10.5)
Chloride: 104 mEq/L (ref 96–112)
Creatinine, Ser: 1.01 mg/dL (ref 0.40–1.50)
GFR: 70.91 mL/min (ref >60.00)
Glucose, Bld: 124 mg/dL — ABNORMAL HIGH (ref 70–99)
Potassium: 4.5 mEq/L (ref 3.5–5.1)
Sodium: 137 mEq/L (ref 135–145)

## 2020-11-16 LAB — LIPID PANEL
Cholesterol: 124 mg/dL (ref 0–200)
HDL: 34.8 mg/dL — ABNORMAL LOW (ref >39.00)
LDL Cholesterol: 70 mg/dL (ref 0–99)
NonHDL: 89.28
Total CHOL/HDL Ratio: 4
Triglycerides: 98 mg/dL (ref 0.0–149.0)
VLDL: 19.6 mg/dL (ref 0.0–40.0)

## 2020-11-16 LAB — PSA, MEDICARE: PSA: 1.32 ng/ml (ref 0.10–4.00)

## 2020-11-16 LAB — VITAMIN D 25 HYDROXY (VIT D DEFICIENCY, FRACTURES): VITD: 38.99 ng/mL (ref 30.00–100.00)

## 2020-11-16 LAB — TSH: TSH: 1.67 u[IU]/mL (ref 0.35–5.50)

## 2020-11-16 NOTE — Assessment & Plan Note (Signed)
Chronic problem.  Well controlled on Coreg and Entresto.  Almost over controlled.  He denies dizziness at this time but I encouraged him to discuss decreasing his Coreg w/ Dr Stanford Breed- particularly if he develops dizziness w/ position changes.  Pt expressed understanding and is in agreement w/ plan.

## 2020-11-16 NOTE — Progress Notes (Signed)
   Subjective:    Patient ID: [name], male    DOB: November 16, 1940, 80 y.o.   MRN: [id number]  HPI HTN- chronic problem, on Coreg 6.90m BID, Entresto 24/21m  No CP.  + chronic SOB.  Denies HAs, visual changes, edema.  Hyperlipidemia- chronic problem, on Crestor 4024maily.  Denies abd pain, N/V.  IPF- following w/ Dr RamChase CallerStarted on Pirfenidone 534m20mD.  Pt would like a 2nd opinion as he is very concerned about his dx.   Review of Systems For ROS see HPI   This visit occurred during the SARS-CoV-2 public health emergency.  Safety protocols were in place, including screening questions prior to the visit, additional usage of staff PPE, and extensive cleaning of exam room while observing appropriate contact time as indicated for disinfecting solutions.      Objective:   Physical Exam Vitals reviewed.  Constitutional:      General: He is not in acute distress.    Appearance: Normal appearance. He is well-developed. He is not ill-appearing.  HENT:     Head: Normocephalic and atraumatic.  Eyes:     Extraocular Movements: Extraocular movements intact.     Conjunctiva/sclera: Conjunctivae normal.     Pupils: Pupils are equal, round, and reactive to light.  Neck:     Thyroid: No thyromegaly.  Cardiovascular:     Rate and Rhythm: Normal rate and regular rhythm.     Pulses: Normal pulses.     Heart sounds: Normal heart sounds. No murmur heard. Pulmonary:     Effort: Pulmonary effort is normal. No respiratory distress.     Comments: Crackles over lower lobes bilaterally Abdominal:     General: Bowel sounds are normal. There is no distension.     Palpations: Abdomen is soft.  Musculoskeletal:     Cervical back: Normal range of motion and neck supple.     Right lower leg: No edema.     Left lower leg: No edema.  Lymphadenopathy:     Cervical: No cervical adenopathy.  Skin:    General: Skin is warm and dry.  Neurological:     General: No focal deficit present.      Mental Status: He is alert and oriented to person, place, and time.     Cranial Nerves: No cranial nerve deficit.  Psychiatric:        Mood and Affect: Mood normal.        Behavior: Behavior normal.          Assessment & Plan:

## 2020-11-16 NOTE — Patient Instructions (Signed)
Follow up in 6 months to recheck BP and cholesterol We'll notify you of your lab results and make any changes if needed We'll call you to get that appt set up w/ [facility] IF you have worsening dizziness or falls- decrease your Coreg to 1/2 tab twice daily Call with any questions or concerns Stay Safe!  Stay Healthy! Happy Holidays!

## 2020-11-16 NOTE — Telephone Encounter (Signed)
Called patient to see if she is interested in the Pulmonary Rehab Program. Patient expressed interest. Explained scheduling process, patient verbalized understanding. Also adv pt where we are with scheduling for PR and that we have a backlog of 1-4 months.

## 2020-11-17 ENCOUNTER — Ambulatory Visit (INDEPENDENT_AMBULATORY_CARE_PROVIDER_SITE_OTHER): Payer: Medicare Other

## 2020-11-17 ENCOUNTER — Other Ambulatory Visit (INDEPENDENT_AMBULATORY_CARE_PROVIDER_SITE_OTHER): Payer: Medicare Other

## 2020-11-17 DIAGNOSIS — N401 Enlarged prostate with lower urinary tract symptoms: Secondary | ICD-10-CM | POA: Diagnosis not present

## 2020-11-17 DIAGNOSIS — I255 Ischemic cardiomyopathy: Secondary | ICD-10-CM

## 2020-11-17 DIAGNOSIS — R350 Frequency of micturition: Secondary | ICD-10-CM | POA: Diagnosis not present

## 2020-11-17 DIAGNOSIS — R7309 Other abnormal glucose: Secondary | ICD-10-CM

## 2020-11-17 DIAGNOSIS — N200 Calculus of kidney: Secondary | ICD-10-CM | POA: Diagnosis not present

## 2020-11-17 LAB — CUP PACEART REMOTE DEVICE CHECK
Battery Remaining Longevity: 96 mo
Battery Remaining Percentage: 100 %
Brady Statistic RA Percent Paced: 0 %
Brady Statistic RV Percent Paced: 97 %
Date Time Interrogation Session: 20221104012000
HighPow Impedance: 45 Ohm
Implantable Lead Implant Date: 20020527
Implantable Lead Implant Date: 20020527
Implantable Lead Implant Date: 20131212
Implantable Lead Location: 753858
Implantable Lead Location: 753859
Implantable Lead Location: 753860
Implantable Lead Model: 148
Implantable Lead Model: 4194
Implantable Lead Model: 6940
Implantable Lead Serial Number: 120244
Implantable Pulse Generator Implant Date: 20220131
Lead Channel Impedance Value: 582 Ohm
Lead Channel Impedance Value: 674 Ohm
Lead Channel Impedance Value: 887 Ohm
Lead Channel Setting Pacing Amplitude: 2 V
Lead Channel Setting Pacing Amplitude: 2.5 V
Lead Channel Setting Pacing Amplitude: 2.5 V
Lead Channel Setting Pacing Pulse Width: 0.4 ms
Lead Channel Setting Pacing Pulse Width: 1 ms
Lead Channel Setting Sensing Sensitivity: 0.6 mV
Lead Channel Setting Sensing Sensitivity: 1 mV
Pulse Gen Serial Number: 149562

## 2020-11-17 LAB — HEMOGLOBIN A1C: Hgb A1c MFr Bld: 6 % (ref 4.6–6.5)

## 2020-11-17 NOTE — Assessment & Plan Note (Signed)
Chronic problem.  Currently on Crestor 73m daily w/o difficulty.  Check labs.  Adjust meds prn

## 2020-11-17 NOTE — Assessment & Plan Note (Signed)
Pt is following w/ Dr Chase Caller but would like a 2nd opinion as this has been overwhelming for him.  He prefers to go to [facility] as they are currently running multiple clinical trials.  Referral placed.

## 2020-11-21 ENCOUNTER — Telehealth: Payer: Self-pay

## 2020-11-21 NOTE — Telephone Encounter (Signed)
Caller name:Waltye scheduler for [employer]   On DPR? :yes  Call back [phone] option [phone]  Provider they see: [name]  Reason for call:Hey Danae Chen I had [facility] call they are not seeing the referral for [place] and if you can fax it to [phone]

## 2020-11-22 NOTE — Progress Notes (Signed)
Remote ICD transmission.   

## 2020-11-22 NOTE — Telephone Encounter (Signed)
I have faxed the referral again   Faxed to [phone]    [email or web address]  [name] on 11/22/2020 [id number] - delivered at 11/22/2020 [phone]

## 2020-11-23 ENCOUNTER — Telehealth (INDEPENDENT_AMBULATORY_CARE_PROVIDER_SITE_OTHER): Payer: Medicare Other | Admitting: Adult Health

## 2020-11-23 ENCOUNTER — Encounter: Payer: Self-pay | Admitting: Adult Health

## 2020-11-23 ENCOUNTER — Other Ambulatory Visit: Payer: Self-pay

## 2020-11-23 DIAGNOSIS — J84112 Idiopathic pulmonary fibrosis: Secondary | ICD-10-CM | POA: Diagnosis not present

## 2020-11-23 DIAGNOSIS — J441 Chronic obstructive pulmonary disease with (acute) exacerbation: Secondary | ICD-10-CM

## 2020-11-23 DIAGNOSIS — I255 Ischemic cardiomyopathy: Secondary | ICD-10-CM | POA: Diagnosis not present

## 2020-11-23 NOTE — Assessment & Plan Note (Signed)
Appears compensated continue on current regimen  Plan  Patient Instructions  Continue on BREZTRI 2 puffs Twice daily, rinse after use.  Continue on Esbriet 2 capsules Three times a day  .  Continue on High protein diet.  Imodium AD  if needed for diarrhea  Follow up in 2 weeks with labs.  Please contact office for sooner follow up if symptoms do not improve or worsen or seek emergency care

## 2020-11-23 NOTE — Assessment & Plan Note (Signed)
IPF with probable UIP on CT scan.  Patient's continue on Esbriet.  Maximum dose will be 534 mg due to his cardiac medication drug interaction. Patient is continue on current regimen.  Dietary suggestions, Imodium and sunscreen were all discussed in detail.  Patient return in 2 weeks with labs.  Plan  Patient Instructions  Continue on BREZTRI 2 puffs Twice daily, rinse after use.  Continue on Esbriet 2 capsules Three times a day  .  Continue on High protein diet.  Imodium AD  if needed for diarrhea  Follow up in 2 weeks with labs.  Please contact office for sooner follow up if symptoms do not improve or worsen or seek emergency care

## 2020-11-23 NOTE — Patient Instructions (Addendum)
Continue on BREZTRI 2 puffs Twice daily, rinse after use.  Continue on Esbriet 2 capsules Three times a day  .  Continue on High protein diet.  Imodium AD  if needed for diarrhea  Follow up in 2 weeks with labs.  Please contact office for sooner follow up if symptoms do not improve or worsen or seek emergency care

## 2020-11-23 NOTE — Progress Notes (Signed)
_0  ID: [name], male    DOB: [birth year], 80 y.o.   MRN: [id number]  Chief Complaint  Patient presents with   Follow-up    Referring provider: [name], MD  HPI: 80 yo followed for mixed obstruction and restriction due to COPD and IPF .   TEST/EVENTS :  High-resolution CT chest compatible with interstitial lung disease probable UIP, mildly progressive compared to previous study.  11/23/2020 Follow up ; COPD and IPF  Patient returns for a 2-week follow-up.  Patient has underlying COPD and IPF.  CT chest in September 22, 2020 showed probable UIP.  And mildly progressive compared to previous study.  Patient was recommended to start on Esbriet.  Patient says he has started Esbriet and seems to be tolerating okay.  He has had a couple episodes where he has some loose stools and he took Imodium.  Unfortunately did have some constipation so he also took MiraLAX which caused him to have upset stomach.  We discussed dietary recommendations and the use of Imodium.  Patient says appetite is good.  No nausea vomiting.  Patient is on Mexiletine , his Esbriet dose will need to remain at 534 mg 3 times a day.  Patient is a been on a tapering dose and has reached his maximum maintenance dose. Patient has shortness of breath with activities.  Remains active.  Gets winded with heavy lifting or prolonged walking.  Has a daily minimally productive cough.   Allergies  Allergen Reactions   Sulfa Antibiotics Rash    Immunization History  Administered Date(s) Administered   Fluad Quad(high Dose 65+) 09/23/2018, 10/05/2020   Influenza Split 11/11/2010, 11/02/2012, 10/14/2013   Influenza Whole 09/27/2011, 11/13/2011   Influenza, High Dose Seasonal PF 09/26/2015, 10/07/2016   Influenza, Seasonal, Injecte, Preservative Fre 09/23/2018   Influenza,inj,Quad PF,6+ Mos 11/03/2016, 09/20/2017   Influenza-Unspecified 09/26/2014, 10/15/2019   PFIZER Comirnaty(Gray Top)Covid-19  Tri-Sucrose Vaccine 04/24/2020   PFIZER(Purple Top)SARS-COV-2 Vaccination 02/02/2019, 02/22/2019, 10/26/2019, 09/29/2020   Pneumococcal Conjugate-13 06/15/2014   Pneumococcal Polysaccharide-23 03/15/2002, 07/10/2015   Td 08/27/2017   Tdap 04/04/2010   Zoster Recombinat (Shingrix) 03/29/2016   Zoster, Live 05/15/2015    Past Medical History:  Diagnosis Date   Adenomatous colon polyp 2009; 06/2012   Colonoscopy 07/2007---Dr. Barron Schmid GI--Repeat 06/2012 showed one tubular adenoma w/out high grade dysplasia.   AICD (automatic cardioverter/defibrillator) present    Chemical engineer   Atrial fibrillation ([place])    Bilateral sensorineural hearing loss    BPH (benign prostatic hypertrophy)    PSAs ok (followed by urologist): no LUTS   CAD (coronary artery disease)    PTCA RCA 1997 (Inf/post MI);   CHF (congestive heart failure) ([place])    Chronic constipation    Chronic rhinitis    ?vasomotor (Dr. Erik Obey)   COPD (chronic obstructive pulmonary disease) ([place])    Diverticulosis of colon 2005   Noted on Colonoscopies in 2005 and 2009 and on CT 2010   Gallstones 06/2014   No evidence of cholecystitis on abd u/s 06/2014   GERD (gastroesophageal reflux disease)    Hepatic steatosis 2010; 2016   Noted on CT abd 2010 and on u/s abd 04/2009 (transaminasemia) and u/s 06/2014.   Hyperlipidemia    Hypertension    Ischemic cardiomyopathy    Microscopic hematuria    Cysto neg 02/2009 except for BPH-likely has microhem from his renal stone dz   Nephrolithiasis    No distal stones on CT 2010; stable 8 mm RLP stone,  32m LMP stone 10/2013 plain film.  Abd u/s 06/2014: 9 mm nonobstructing lower pole right renal stone.  03/2015 urol: stable nonobstructing renal calc by CT 12/2014 and KUB 03/2015.   Obstructive sleep apnea    no cpap uses since weight loss of 50 pounds   Presence of permanent cardiac pacemaker    [employer]   Squamous cell carcinoma of back    Ventricular tachycardia     Tobacco  History: Social History   Tobacco Use  Smoking Status Former   Packs/day: 2.00   Years: 15.00   Pack years: 30.00   Types: Cigarettes   Quit date: 01/15/1980   Years since quitting: 40.8  Smokeless Tobacco Never   Counseling given: Not Answered   Outpatient Medications Prior to Visit  Medication Sig Dispense Refill   antiseptic oral rinse (BIOTENE) LIQD 15 mLs by Mouth Rinse route in the morning and at bedtime.     Budeson-Glycopyrrol-Formoterol (BREZTRI AEROSPHERE) 160-9-4.8 MCG/ACT AERO Inhale 2 puffs into the lungs in the morning and at bedtime. 5.9 g 5   carvedilol (COREG) 6.25 MG tablet TAKE 1 TABLET(6.25 MG) BY MOUTH TWICE DAILY WITH A MEAL 180 tablet 3   cholecalciferol (VITAMIN D) 1000 units tablet Take 1,000 Units by mouth every evening.     eplerenone (INSPRA) 25 MG tablet Take 1 tablet (25 mg total) by mouth daily. 30 tablet 11   furosemide (LASIX) 40 MG tablet TAKE 2 TABLETS BY MOUTH EVERY DAY 90 tablet 3   ipratropium (ATROVENT) 0.06 % nasal spray Place 2 sprays into both nostrils 3 (three) times daily. 15 mL 1   levalbuterol (XOPENEX HFA) 45 MCG/ACT inhaler Inhale 2 puffs into the lungs every 4 (four) hours as needed for wheezing. 1 each 12   loratadine (CLARITIN) 10 MG tablet Take 10 mg by mouth in the morning.     Magnesium 250 MG TABS Take 250 mg by mouth every evening.     mexiletine (MEXITIL) 150 MG capsule TAKE 2 CAPSULES(300 MG) BY MOUTH TWICE DAILY 360 capsule 2   Multiple Vitamin (MULTIVITAMIN WITH MINERALS) TABS Take 1 tablet by mouth every evening.     nitroGLYCERIN (NITROSTAT) 0.4 MG SL tablet DISSOLVE 1 TABLET UNDER THE TONGUE EVERY 5 MINUTES AS NEEDED FOR CHEST PAIN (Patient taking differently: Place 0.4 mg under the tongue every 5 (five) minutes x 3 doses as needed for chest pain.) 25 tablet 5   Pirfenidone (ESBRIET) 267 MG TABS Take 1 tab three times daily for 7 days, then 2 tabs three times daily for 7 days, then 3 tabs three times daily thereafter. 207  tablet 0   [START ON 12/06/2020] Pirfenidone (ESBRIET) 267 MG TABS Take 2 tablets (534 mg total) by mouth with breakfast, with lunch, and with evening meal. 180 tablet 1   polyvinyl alcohol-povidone (HYPOTEARS) 1.4-0.6 % ophthalmic solution Place 1-2 drops into both eyes in the morning and at bedtime.     potassium chloride SA (KLOR-CON) 20 MEQ tablet Take 1 tablet (20 mEq total) by mouth daily with lunch. 90 tablet 2   prednisoLONE acetate (PRED FORTE) 1 % ophthalmic suspension Place 1 drop into the left eye in the morning.     Probiotic Product (PROBIOTIC DAILY PO) Take 1 capsule by mouth every evening.     rivaroxaban (XARELTO) 20 MG TABS tablet TAKE 1 TABLET(20 MG) BY MOUTH DAILY WITH SUPPER 90 tablet 1   rosuvastatin (CRESTOR) 40 MG tablet Take 1 tablet (40 mg total) by mouth  every evening. 90 tablet 2   sacubitril-valsartan (ENTRESTO) 24-26 MG TAKE 1 TABLET BY MOUTH TWICE DAILY 180 tablet 0   triamcinolone (KENALOG) 0.1 % Apply 1 application topically 2 (two) times daily as needed (skin irritation).     Wheat Dextrin (BENEFIBER PO) Take 2 Scoops by mouth 2 (two) times daily.     XOPENEX HFA 45 MCG/ACT inhaler Inhale 1-2 puffs into the lungs every 4 (four) hours as needed for wheezing. 1 each 11   No facility-administered medications prior to visit.     Review of Systems:   Constitutional:   No  weight loss, night sweats,  Fevers, chills,  +fatigue, or  lassitude.  HEENT:   No headaches,  Difficulty swallowing,  Tooth/dental problems, or  Sore throat,                No sneezing, itching, ear ache, nasal congestion, post nasal drip,   CV:  No chest pain,  Orthopnea, PND, swelling in lower extremities, anasarca, dizziness, palpitations, syncope.   GI  No heartburn, indigestion, abdominal pain, nausea, vomiting, diarrhea, change in bowel habits, loss of appetite, bloody stools.   Resp:   No chest wall deformity  Skin: no rash or lesions.  GU: no dysuria, change in color of urine,  no urgency or frequency.  No flank pain, no hematuria   MS:  No joint pain or swelling.  No decreased range of motion.  No back pain.    Physical Exam  BP 102/70 (BP Location: Left Arm, Patient Position: Sitting, Cuff Size: Normal)   Pulse 87   Temp 97.8 F (36.6 C) (Oral)   Ht _0  (1.778 m)   Wt 207 lb 3.2 oz (94 kg)   SpO2 99%   BMI 29.73 kg/m   GEN: A/Ox3; pleasant , NAD, elderly   HEENT:  [place]/AT,   NOSE-clear, THROAT-clear, no lesions, no postnasal drip or exudate noted.   NECK:  Supple w/ fair ROM; no JVD; normal carotid impulses w/o bruits; no thyromegaly or nodules palpated; no lymphadenopathy.    RESP bibasilar crackles  no accessory muscle use, no dullness to percussion  CARD:  irreg no m/r/g, tr peripheral edema, pulses intact, no cyanosis or clubbing.  GI:   Soft & nt; nml bowel sounds; no organomegaly or masses detected.   Musco: Warm bil, no deformities or joint swelling noted.   Neuro: alert, no focal deficits noted.    Skin: Warm, no lesions or rashes    Lab Results:  CBC   BMET  ProBNP   Imaging: CUP PACEART REMOTE DEVICE CHECK  Result Date: 11/17/2020 Scheduled remote reviewed. Normal device function.  Persistent AF with CVR. AF Burden 100%. +OAC. BiV paced 99%. HF dx's stable. Next remote 91 days. LH     PFT Results Latest Ref Rng & Units 09/27/2020 02/09/2018 03/12/2017 02/08/2015  FVC-Pre L 2.34 3.02 2.82 3.20  FVC-Predicted Pre % 55 69 64 71  FVC-Post L 2.44 2.97 2.78 3.19  FVC-Predicted Post % 57 67 63 71  Pre FEV1/FVC % % 84 82 82 82  Post FEV1/FCV % % 82 82 83 82  FEV1-Pre L 1.98 2.48 2.30 2.61  FEV1-Predicted Pre % 65 78 72 80  FEV1-Post L 2.00 2.42 2.30 2.61  DLCO uncorrected ml/min/mmHg 14.62 18.10 18.46 19.06  DLCO UNC% % 57 53 54 56  DLCO corrected ml/min/mmHg 14.62 17.90 - -  DLCO COR %Predicted % 57 53 - -  DLVA Predicted % 101 90 95  90  TLC L 4.17 4.65 4.94 4.59  TLC % Predicted % 57 64 68 63  RV % Predicted % 46 56  68 44    No results found for: NITRICOXIDE      Assessment & Plan:   IPF (idiopathic pulmonary fibrosis) (HCC) IPF with probable UIP on CT scan.  Patient's continue on Esbriet.  Maximum dose will be 534 mg due to his cardiac medication drug interaction. Patient is continue on current regimen.  Dietary suggestions, Imodium and sunscreen were all discussed in detail.  Patient return in 2 weeks with labs.  Plan  Patient Instructions  Continue on BREZTRI 2 puffs Twice daily, rinse after use.  Continue on Esbriet 2 capsules Three times a day  .  Continue on High protein diet.  Imodium AD  if needed for diarrhea  Follow up in 2 weeks with labs.  Please contact office for sooner follow up if symptoms do not improve or worsen or seek emergency care       COPD (chronic obstructive pulmonary disease) ([place]) Appears compensated continue on current regimen  Plan  Patient Instructions  Continue on BREZTRI 2 puffs Twice daily, rinse after use.  Continue on Esbriet 2 capsules Three times a day  .  Continue on High protein diet.  Imodium AD  if needed for diarrhea  Follow up in 2 weeks with labs.  Please contact office for sooner follow up if symptoms do not improve or worsen or seek emergency care         [name], NP 11/23/2020

## 2020-11-29 ENCOUNTER — Other Ambulatory Visit: Payer: Self-pay | Admitting: Cardiology

## 2020-11-29 DIAGNOSIS — L720 Epidermal cyst: Secondary | ICD-10-CM | POA: Diagnosis not present

## 2020-11-29 DIAGNOSIS — Z85828 Personal history of other malignant neoplasm of skin: Secondary | ICD-10-CM | POA: Diagnosis not present

## 2020-11-29 DIAGNOSIS — L812 Freckles: Secondary | ICD-10-CM | POA: Diagnosis not present

## 2020-11-29 DIAGNOSIS — D1801 Hemangioma of skin and subcutaneous tissue: Secondary | ICD-10-CM | POA: Diagnosis not present

## 2020-11-29 DIAGNOSIS — L570 Actinic keratosis: Secondary | ICD-10-CM | POA: Diagnosis not present

## 2020-11-29 DIAGNOSIS — L821 Other seborrheic keratosis: Secondary | ICD-10-CM | POA: Diagnosis not present

## 2020-12-01 ENCOUNTER — Telehealth: Payer: Self-pay | Admitting: Emergency Medicine

## 2020-12-01 NOTE — Telephone Encounter (Signed)
Called patient but he did not answer. Left message for him to call us back.

## 2020-12-03 ENCOUNTER — Encounter (HOSPITAL_COMMUNITY): Payer: Self-pay | Admitting: *Deleted

## 2020-12-03 NOTE — Progress Notes (Signed)
Received referral from Dr. Chase Caller for this pt to participate in pulmonary rehab with the diagnosis of IPF. Clinical review of pt follow up appt on 10/31 and 11/10  Pulmonary office note.  Pt with Covid Risk Score - 8. Pt appropriate for scheduling for Pulmonary rehab.  Will forward to support staff for scheduling  when able as there is a wait list and verification of insurance eligibility/benefits with pt consent. Cherre Huger, BSN Cardiac and Training and development officer

## 2020-12-06 ENCOUNTER — Ambulatory Visit (INDEPENDENT_AMBULATORY_CARE_PROVIDER_SITE_OTHER): Payer: Medicare Other | Admitting: Adult Health

## 2020-12-06 ENCOUNTER — Other Ambulatory Visit: Payer: Self-pay

## 2020-12-06 ENCOUNTER — Encounter: Payer: Self-pay | Admitting: Adult Health

## 2020-12-06 VITALS — BP 118/72 | HR 70 | Temp 97.7°F | Ht 70.0 in | Wt 207.2 lb

## 2020-12-06 DIAGNOSIS — J84112 Idiopathic pulmonary fibrosis: Secondary | ICD-10-CM | POA: Diagnosis not present

## 2020-12-06 DIAGNOSIS — J449 Chronic obstructive pulmonary disease, unspecified: Secondary | ICD-10-CM | POA: Diagnosis not present

## 2020-12-06 DIAGNOSIS — I255 Ischemic cardiomyopathy: Secondary | ICD-10-CM

## 2020-12-06 NOTE — Patient Instructions (Addendum)
Continue on BREZTRI 2 puffs Twice daily, rinse after use.  Continue on Esbriet 2 capsules Three times a day  .  Continue on High protein diet.  Imodium AD  if needed for diarrhea  Labs in 4 weeks (LFTs)  Follow up in 6 weeks with Dr. Chase Caller as planned and As needed  Please contact office for sooner follow up if symptoms do not improve or worsen or seek emergency care

## 2020-12-06 NOTE — Progress Notes (Signed)
_0  ID: [name], male    DOB: 1940/05/12, 80 y.o.   MRN: [id number]  Chief Complaint  Patient presents with   Follow-up    Referring provider: [name], MD  HPI: 80 year old male followed for COPD and IPF   TEST/EVENTS :  09/22/2020 High-resolution CT chest compatible with interstitial lung disease probable UIP, mildly progressive compared to previous study.   12/06/2020 Follow up ; IPF, COPD  Patient returns for a 2-week follow-up.  Patient has underlying COPD.  Is on Breztri inhaler.  Patient says overall breathing is doing okay.  Feels that it has slightly improved with decreased cough.  Patient also has underlying IPF.  Recently started on Esbriet.  He is on a lower dose due to drug interactions.  Currently on 534 mg 3 times a day.  Last visit patient was having some increased stomach issues.  We discussed some dietary changes.  Patient is now eating less fried foods and higher protein.  Patient says this is made a huge difference and stomach is much better. Hepatic function panel on November 16, 2020 showed normal LFTs.    Allergies  Allergen Reactions   Sulfa Antibiotics Rash    Immunization History  Administered Date(s) Administered   Fluad Quad(high Dose 65+) 09/23/2018, 10/05/2020   Influenza Split 11/11/2010, 11/02/2012, 10/14/2013   Influenza Whole 09/27/2011, 11/13/2011   Influenza, High Dose Seasonal PF 09/26/2015, 10/07/2016   Influenza, Seasonal, Injecte, Preservative Fre 09/23/2018   Influenza,inj,Quad PF,6+ Mos 11/03/2016, 09/20/2017   Influenza-Unspecified 09/26/2014, 10/15/2019   PFIZER Comirnaty(Gray Top)Covid-19 Tri-Sucrose Vaccine 04/24/2020   PFIZER(Purple Top)SARS-COV-2 Vaccination 02/02/2019, 02/22/2019, 10/26/2019, 09/29/2020   Pneumococcal Conjugate-13 06/15/2014   Pneumococcal Polysaccharide-23 03/15/2002, 07/10/2015   Td 08/27/2017   Tdap 04/04/2010   Zoster Recombinat (Shingrix) 03/29/2016   Zoster, Live 05/15/2015     Past Medical History:  Diagnosis Date   Adenomatous colon polyp 2009; 06/2012   Colonoscopy 07/2007---Dr. Barron Schmid GI--Repeat 06/2012 showed one tubular adenoma w/out high grade dysplasia.   AICD (automatic cardioverter/defibrillator) present    Chemical engineer   Atrial fibrillation ([place])    Bilateral sensorineural hearing loss    BPH (benign prostatic hypertrophy)    PSAs ok (followed by urologist): no LUTS   CAD (coronary artery disease)    PTCA RCA 1997 (Inf/post MI);   CHF (congestive heart failure) ([place])    Chronic constipation    Chronic rhinitis    ?vasomotor (Dr. Erik Obey)   COPD (chronic obstructive pulmonary disease) ([place])    Diverticulosis of colon 2005   Noted on Colonoscopies in 2005 and 2009 and on CT 2010   Gallstones 06/2014   No evidence of cholecystitis on abd u/s 06/2014   GERD (gastroesophageal reflux disease)    Hepatic steatosis 2010; 2016   Noted on CT abd 2010 and on u/s abd 04/2009 (transaminasemia) and u/s 06/2014.   Hyperlipidemia    Hypertension    Ischemic cardiomyopathy    Microscopic hematuria    Cysto neg 02/2009 except for BPH-likely has microhem from his renal stone dz   Nephrolithiasis    No distal stones on CT 2010; stable 8 mm RLP stone, 64m LMP stone 10/2013 plain film.  Abd u/s 06/2014: 9 mm nonobstructing lower pole right renal stone.  03/2015 urol: stable nonobstructing renal calc by CT 12/2014 and KUB 03/2015.   Obstructive sleep apnea    no cpap uses since weight loss of 50 pounds   Presence of permanent cardiac pacemaker  [employer]   Squamous cell carcinoma of back    Ventricular tachycardia     Tobacco History: Social History   Tobacco Use  Smoking Status Former   Packs/day: 2.00   Years: 15.00   Pack years: 30.00   Types: Cigarettes   Quit date: 01/15/1980   Years since quitting: 40.9  Smokeless Tobacco Never   Counseling given: Not Answered   Outpatient Medications Prior to Visit  Medication Sig Dispense  Refill   antiseptic oral rinse (BIOTENE) LIQD 15 mLs by Mouth Rinse route in the morning and at bedtime.     Budeson-Glycopyrrol-Formoterol (BREZTRI AEROSPHERE) 160-9-4.8 MCG/ACT AERO Inhale 2 puffs into the lungs in the morning and at bedtime. 5.9 g 5   carvedilol (COREG) 6.25 MG tablet TAKE 1 TABLET(6.25 MG) BY MOUTH TWICE DAILY WITH A MEAL 180 tablet 3   cholecalciferol (VITAMIN D) 1000 units tablet Take 1,000 Units by mouth every evening.     eplerenone (INSPRA) 25 MG tablet Take 1 tablet (25 mg total) by mouth daily. 30 tablet 11   furosemide (LASIX) 40 MG tablet TAKE 2 TABLETS BY MOUTH EVERY DAY 90 tablet 3   ipratropium (ATROVENT) 0.06 % nasal spray Place 2 sprays into both nostrils 3 (three) times daily. 15 mL 1   levalbuterol (XOPENEX HFA) 45 MCG/ACT inhaler Inhale 2 puffs into the lungs every 4 (four) hours as needed for wheezing. 1 each 12   loratadine (CLARITIN) 10 MG tablet Take 10 mg by mouth in the morning.     Magnesium 250 MG TABS Take 250 mg by mouth every evening.     mexiletine (MEXITIL) 150 MG capsule TAKE 2 CAPSULES(300 MG) BY MOUTH TWICE DAILY 360 capsule 2   Multiple Vitamin (MULTIVITAMIN WITH MINERALS) TABS Take 1 tablet by mouth every evening.     nitroGLYCERIN (NITROSTAT) 0.4 MG SL tablet DISSOLVE 1 TABLET UNDER THE TONGUE EVERY 5 MINUTES AS NEEDED FOR CHEST PAIN (Patient taking differently: Place 0.4 mg under the tongue every 5 (five) minutes x 3 doses as needed for chest pain.) 25 tablet 5   Pirfenidone (ESBRIET) 267 MG TABS Take 1 tab three times daily for 7 days, then 2 tabs three times daily for 7 days, then 3 tabs three times daily thereafter. 207 tablet 0   Pirfenidone (ESBRIET) 267 MG TABS Take 2 tablets (534 mg total) by mouth with breakfast, with lunch, and with evening meal. 180 tablet 1   polyvinyl alcohol-povidone (HYPOTEARS) 1.4-0.6 % ophthalmic solution Place 1-2 drops into both eyes in the morning and at bedtime.     potassium chloride SA (KLOR-CON) 20 MEQ  tablet Take 1 tablet (20 mEq total) by mouth daily with lunch. 90 tablet 2   prednisoLONE acetate (PRED FORTE) 1 % ophthalmic suspension Place 1 drop into the left eye in the morning.     Probiotic Product (PROBIOTIC DAILY PO) Take 1 capsule by mouth every evening.     rivaroxaban (XARELTO) 20 MG TABS tablet TAKE 1 TABLET(20 MG) BY MOUTH DAILY WITH SUPPER 90 tablet 1   rosuvastatin (CRESTOR) 40 MG tablet Take 1 tablet (40 mg total) by mouth every evening. 90 tablet 2   sacubitril-valsartan (ENTRESTO) 24-26 MG TAKE 1 TABLET BY MOUTH TWICE DAILY 180 tablet 0   triamcinolone (KENALOG) 0.1 % Apply 1 application topically 2 (two) times daily as needed (skin irritation).     Wheat Dextrin (BENEFIBER PO) Take 2 Scoops by mouth 2 (two) times daily.     Penne Lash  HFA 45 MCG/ACT inhaler Inhale 1-2 puffs into the lungs every 4 (four) hours as needed for wheezing. (Patient not taking: Reported on 12/06/2020) 1 each 11   No facility-administered medications prior to visit.     Review of Systems:   Constitutional:   No  weight loss, night sweats,  Fevers, chills, + fatigue, or  lassitude.  HEENT:   No headaches,  Difficulty swallowing,  Tooth/dental problems, or  Sore throat,                No sneezing, itching, ear ache, nasal congestion, post nasal drip,   CV:  No chest pain,  Orthopnea, PND, swelling in lower extremities, anasarca, dizziness, palpitations, syncope.   GI  No heartburn, indigestion, abdominal pain, nausea, vomiting, diarrhea, change in bowel habits, loss of appetite, bloody stools.   Resp:   No chest wall deformity  Skin: no rash or lesions.  GU: no dysuria, change in color of urine, no urgency or frequency.  No flank pain, no hematuria   MS:  No joint pain or swelling.  No decreased range of motion.  No back pain.    Physical Exam  BP 118/72 (BP Location: Left Arm, Patient Position: Sitting, Cuff Size: Normal)   Pulse 70   Temp 97.7 F (36.5 C) (Oral)   Ht _0  (1.778  m)   Wt 207 lb 3.2 oz (94 kg)   SpO2 98%   BMI 29.73 kg/m   GEN: A/Ox3; pleasant , NAD, well nourished    HEENT:  [place]/AT,   NOSE-clear, THROAT-clear, no lesions, no postnasal drip or exudate noted.   NECK:  Supple w/ fair ROM; no JVD; normal carotid impulses w/o bruits; no thyromegaly or nodules palpated; no lymphadenopathy.    RESP faint bibasilar crackles  no accessory muscle use, no dullness to percussion  CARD:  RRR, no m/r/g, no peripheral edema, pulses intact, no cyanosis or clubbing.  GI:   Soft & nt; nml bowel sounds; no organomegaly or masses detected.   Musco: Warm bil, no deformities or joint swelling noted.   Neuro: alert, no focal deficits noted.    Skin: Warm, no lesions or rashes    Lab Results:    BMET        PFT Results Latest Ref Rng & Units 09/27/2020 02/09/2018 03/12/2017 02/08/2015  FVC-Pre L 2.34 3.02 2.82 3.20  FVC-Predicted Pre % 55 69 64 71  FVC-Post L 2.44 2.97 2.78 3.19  FVC-Predicted Post % 57 67 63 71  Pre FEV1/FVC % % 84 82 82 82  Post FEV1/FCV % % 82 82 83 82  FEV1-Pre L 1.98 2.48 2.30 2.61  FEV1-Predicted Pre % 65 78 72 80  FEV1-Post L 2.00 2.42 2.30 2.61  DLCO uncorrected ml/min/mmHg 14.62 18.10 18.46 19.06  DLCO UNC% % 57 53 54 56  DLCO corrected ml/min/mmHg 14.62 17.90 - -  DLCO COR %Predicted % 57 53 - -  DLVA Predicted % 101 90 95 90  TLC L 4.17 4.65 4.94 4.59  TLC % Predicted % 57 64 68 63  RV % Predicted % 46 56 68 44    No results found for: NITRICOXIDE      Assessment & Plan:   COPD (chronic obstructive pulmonary disease) (HCC) Appears stable continue on current regimen  Plan  Patient Instructions  Continue on BREZTRI 2 puffs Twice daily, rinse after use.  Continue on Esbriet 2 capsules Three times a day  .  Continue on High protein diet.  Imodium AD  if needed for diarrhea  Labs in 4 weeks (LFTs)  Follow up in 6 weeks with Dr. Chase Caller as planned and As needed  Please contact office for sooner follow up  if symptoms do not improve or worsen or seek emergency care       IPF (idiopathic pulmonary fibrosis) ([place]) Currently stable continue on current regimen Continue on Esbriet.  Check LFTs next month on return visit  Plan  Patient Instructions  Continue on BREZTRI 2 puffs Twice daily, rinse after use.  Continue on Esbriet 2 capsules Three times a day  .  Continue on High protein diet.  Imodium AD  if needed for diarrhea  Labs in 4 weeks (LFTs)  Follow up in 6 weeks with Dr. Chase Caller as planned and As needed  Please contact office for sooner follow up if symptoms do not improve or worsen or seek emergency care         [name], NP 12/06/2020

## 2020-12-06 NOTE — Assessment & Plan Note (Signed)
Appears stable continue on current regimen  Plan  Patient Instructions  Continue on BREZTRI 2 puffs Twice daily, rinse after use.  Continue on Esbriet 2 capsules Three times a day  .  Continue on High protein diet.  Imodium AD  if needed for diarrhea  Labs in 4 weeks (LFTs)  Follow up in 6 weeks with Dr. Chase Caller as planned and As needed  Please contact office for sooner follow up if symptoms do not improve or worsen or seek emergency care

## 2020-12-06 NOTE — Assessment & Plan Note (Signed)
Currently stable continue on current regimen Continue on Esbriet.  Check LFTs next month on return visit  Plan  Patient Instructions  Continue on BREZTRI 2 puffs Twice daily, rinse after use.  Continue on Esbriet 2 capsules Three times a day  .  Continue on High protein diet.  Imodium AD  if needed for diarrhea  Labs in 4 weeks (LFTs)  Follow up in 6 weeks with Dr. Chase Caller as planned and As needed  Please contact office for sooner follow up if symptoms do not improve or worsen or seek emergency care

## 2020-12-06 NOTE — Telephone Encounter (Signed)
Since RB wanted pt to switch to [facility] for care of ILD, pt does not need to make an appt with RB. Attempted to call pt but unable to reach. Left pt a detailed message stating that he does not need to make an appt with RB since he switched care to [facility] per RB. Nothing further needed.

## 2020-12-14 ENCOUNTER — Telehealth: Payer: Self-pay | Admitting: Pharmacist

## 2020-12-14 NOTE — Progress Notes (Addendum)
Chronic Care Management Pharmacy Assistant   Name: [name]  MRN: [id number] DOB: Sep 03, 1940   Reason for Encounter: Disease State - Hypertension Call     Recent office visits:  11/16/20 Joel Asa, MD (PCP) - Family Medicine - Hypertension - Labs were ordered. Referrlal to  Pulmonology placed. IF you have worsening dizziness or falls- decrease your Coreg to 1/2 tab twice daily. Follow up in 6 months.  Recent consult visits:  12/06/20 Joel Edison, NP - IPF - Pulmonology - levalbuterol [facility] HFA) 45 MCG/ACT inhaler Inhale 2 puffs into the lungs every 4 (four) hours as needed for wheezing prescribed. Labs ordered. Imodium AD  if needed for diarrhea . Labs in 4 weeks. Follow up in 6 weeks.   11/23/20 Joel Edison, NP - IPF - Pulmonology (Video Visit) - No medication changes. Follow up in 2 weeks for labs.  11/08/20 Joel Males, MD - Pulmonololgy - IPF - Referral to Pulmonary rehab. Start esbiret per protocol 11/14/20. [place] breztri daily and albueo as needed. Follow up in 2 weeks.    Hospital visits:  None in previous 6 months  Medications: Outpatient Encounter Medications as of 12/14/2020  Medication Sig Note   antiseptic oral rinse (BIOTENE) LIQD 15 mLs by Mouth Rinse route in the morning and at bedtime.    Budeson-Glycopyrrol-Formoterol (BREZTRI AEROSPHERE) 160-9-4.8 MCG/ACT AERO Inhale 2 puffs into the lungs in the morning and at bedtime.    carvedilol (COREG) 6.25 MG tablet TAKE 1 TABLET(6.25 MG) BY MOUTH TWICE DAILY WITH A MEAL    cholecalciferol (VITAMIN D) 1000 units tablet Take 1,000 Units by mouth every evening.    eplerenone (INSPRA) 25 MG tablet Take 1 tablet (25 mg total) by mouth daily.    furosemide (LASIX) 40 MG tablet TAKE 2 TABLETS BY MOUTH EVERY DAY    ipratropium (ATROVENT) 0.06 % nasal spray Place 2 sprays into both nostrils 3 (three) times daily.    levalbuterol (XOPENEX HFA) 45 MCG/ACT inhaler Inhale 2 puffs into the lungs every 4 (four)  hours as needed for wheezing.    loratadine (CLARITIN) 10 MG tablet Take 10 mg by mouth in the morning.    Magnesium 250 MG TABS Take 250 mg by mouth every evening.    mexiletine (MEXITIL) 150 MG capsule TAKE 2 CAPSULES(300 MG) BY MOUTH TWICE DAILY    Multiple Vitamin (MULTIVITAMIN WITH MINERALS) TABS Take 1 tablet by mouth every evening.    nitroGLYCERIN (NITROSTAT) 0.4 MG SL tablet DISSOLVE 1 TABLET UNDER THE TONGUE EVERY 5 MINUTES AS NEEDED FOR CHEST PAIN (Patient taking differently: Place 0.4 mg under the tongue every 5 (five) minutes x 3 doses as needed for chest pain.) 02/14/2020: Hasnt used in 10 years   Pirfenidone (ESBRIET) 267 MG TABS Take 1 tab three times daily for 7 days, then 2 tabs three times daily for 7 days, then 3 tabs three times daily thereafter.    Pirfenidone (ESBRIET) 267 MG TABS Take 2 tablets (534 mg total) by mouth with breakfast, with lunch, and with evening meal.    polyvinyl alcohol-povidone (HYPOTEARS) 1.4-0.6 % ophthalmic solution Place 1-2 drops into both eyes in the morning and at bedtime.    potassium chloride SA (KLOR-CON) 20 MEQ tablet Take 1 tablet (20 mEq total) by mouth daily with lunch.    prednisoLONE acetate (PRED FORTE) 1 % ophthalmic suspension Place 1 drop into the left eye in the morning.    Probiotic Product (PROBIOTIC DAILY PO) Take 1 capsule by  mouth every evening.    rivaroxaban (XARELTO) 20 MG TABS tablet TAKE 1 TABLET(20 MG) BY MOUTH DAILY WITH SUPPER    rosuvastatin (CRESTOR) 40 MG tablet Take 1 tablet (40 mg total) by mouth every evening.    sacubitril-valsartan (ENTRESTO) 24-26 MG TAKE 1 TABLET BY MOUTH TWICE DAILY    triamcinolone (KENALOG) 0.1 % Apply 1 application topically 2 (two) times daily as needed (skin irritation).    Wheat Dextrin (BENEFIBER PO) Take 2 Scoops by mouth 2 (two) times daily.    No facility-administered encounter medications on file as of 12/14/2020.   Current antihypertensive regimen:  Coreg 6.25 mg BID Entresto  24-26 mg BID Furosemide 40 mg 2 tabs daily   How often are you checking your Blood Pressure?  Patient reports he is checking his blood pressure every 2 days and they have been reading low and he has an appointment this week with his cardiollogist to discuss further.    Current home BP readings: (12/11/20) 90/68, (12/09/20)89/64 (12/07/20) 113/61, (12/05/20) 97/55, (12/03/20) 113/65, (12/01/20) 106/68   What recent interventions/DTPs have been made by any provider to improve Blood Pressure control since last CPP Visit:  Patient denied any medication changes since last visit with CPP. Was instructed could decrease Coreg to 1/2 tablet daily if experiences any dizziness.    Any recent hospitalizations or ED visits since last visit with CPP?  Patient has not had hospitalizations or ED visits since last visit with CPP.   What diet changes have been made to improve Blood Pressure Control?  Patient reports he has been watching his sugar and salt intake.    What exercise is being done to improve your Blood Pressure Control?  Patient is active but state since starting his new lung medicine it has "washed" him out a little more but he does walk a lot.    Adherence Review: Is the patient currently on ACE/ARB medication? Yes Does the patient have >5 day gap between last estimated fill dates? No  Coreg 6.25 mg BID - last filled 11/29/20 90 days Entresto 24-26 mg BID - last filled 10/26/20 90 days  Furosemide 40 mg 2 tabs daily  - last filled 11/29/2020   Care Gaps  AWV: done 11/01/2019 Colonoscopy:  due 11/06/21 DM Eye Exam: N/A DM Foot Exam: N/A Microalbumin: unknown  HbgAIC: done 11/17/20 (6.0) DEXA: N/A Mammogram: N/A   Star Rating Drugs: rosuvastatin (CRESTOR) 40 MG tablet - last filled 09/14/20 90 days   Future Appointments  Date Time Provider [place]  12/21/2020  2:15 PM Joel Males, MD [facility] None  12/27/2020 10:40 AM Joel Perla, MD [facility]  None  01/19/2021  1:00 PM [facility] PFT ROOM [facility] None  01/19/2021  2:00 PM Joel Males, MD [facility] None  02/16/2021  7:00 AM [facility] DEVICE REMOTES [facility]  05/17/2021  2:00 PM Joel Minium, MD [facility]  05/18/2021  7:00 AM [facility] DEVICE REMOTES [facility]  08/17/2021  7:00 AM [facility] DEVICE REMOTES [facility]    [name], [place] Clinical Pharmacist Assistant  [phone]

## 2020-12-20 NOTE — Progress Notes (Signed)
HPI: FU history of coronary artery disease, mixed ischemic and nonischemic cardiomyopathy, history of ICD, paroxysmal atrial fibrillation, and ventricular tachycardia. Previous abdominal ultrasound in April 2006 showed no aneurysm. Last cardiac catheterization in October 2008 showed an occluded right coronary artery with left-to-right collateralization. Ejection fraction is 20%. There was no other coronary disease noted. ABIs in December of 2011 were normal. Previous carotid Dopplers in Sept 2014 showed 0-39% stenosis bilaterally. The patient has had upgrade of his device to CRT-D. Patient had revision of his left ventricular lead in December 2013. Amiodarone discontinued March 2019 because of potential pulmonary toxicity. Echocardiogram repeated June 2021 and showed ejection fraction 30 to 35%, moderate left ventricular enlargement, grade 1 diastolic dysfunction, moderate left atrial enlargement, mild mitral regurgitation.  Nuclear study April 2022 showed ejection fraction 16%, large scar with no significant ischemia. Pt underwent DCCV 05/10/20.  At follow-up office visit patient was in recurrent atrial fibrillation and now felt to be permanent.  Chest CT September 2022 showed interstitial lung disease (likely UIP), coronary calcification and aortic valve calcification.  Since last seen, he has dyspnea on exertion which is new and he attributes to pulmonary fibrosis.  He is also has no side effects from his medication including diarrhea.  He denies orthopnea, PND, pedal edema, chest pain or syncope.  He does describe some fatigue.  Current Outpatient Medications  Medication Sig Dispense Refill   antiseptic oral rinse (BIOTENE) LIQD 15 mLs by Mouth Rinse route in the morning and at bedtime.     Budeson-Glycopyrrol-Formoterol (BREZTRI AEROSPHERE) 160-9-4.8 MCG/ACT AERO Inhale 2 puffs into the lungs in the morning and at bedtime. 5.9 g 5   carvedilol (COREG) 6.25 MG tablet TAKE 1 TABLET(6.25 MG) BY  MOUTH TWICE DAILY WITH A MEAL 180 tablet 3   cholecalciferol (VITAMIN D) 1000 units tablet Take 1,000 Units by mouth every evening.     eplerenone (INSPRA) 25 MG tablet Take 1 tablet (25 mg total) by mouth daily. 30 tablet 11   furosemide (LASIX) 40 MG tablet TAKE 2 TABLETS BY MOUTH EVERY DAY 90 tablet 3   ipratropium (ATROVENT) 0.06 % nasal spray Place 2 sprays into both nostrils 3 (three) times daily. 15 mL 1   levalbuterol (XOPENEX HFA) 45 MCG/ACT inhaler Inhale 2 puffs into the lungs every 4 (four) hours as needed for wheezing. 1 each 12   loratadine (CLARITIN) 10 MG tablet Take 10 mg by mouth in the morning.     Magnesium 250 MG TABS Take 250 mg by mouth every evening.     mexiletine (MEXITIL) 150 MG capsule TAKE 2 CAPSULES(300 MG) BY MOUTH TWICE DAILY 360 capsule 2   Multiple Vitamin (MULTIVITAMIN WITH MINERALS) TABS Take 1 tablet by mouth every evening.     nitroGLYCERIN (NITROSTAT) 0.4 MG SL tablet DISSOLVE 1 TABLET UNDER THE TONGUE EVERY 5 MINUTES AS NEEDED FOR CHEST PAIN (Patient taking differently: Place 0.4 mg under the tongue every 5 (five) minutes x 3 doses as needed for chest pain.) 25 tablet 5   Pirfenidone (ESBRIET) 267 MG TABS Take 1 tab three times daily for 7 days, then 2 tabs three times daily for 7 days, then 3 tabs three times daily thereafter. 207 tablet 0   Pirfenidone (ESBRIET) 267 MG TABS Take 2 tablets (534 mg total) by mouth with breakfast, with lunch, and with evening meal. 180 tablet 1   polyvinyl alcohol-povidone (HYPOTEARS) 1.4-0.6 % ophthalmic solution Place 1-2 drops into both eyes in the morning  and at bedtime.     potassium chloride SA (KLOR-CON) 20 MEQ tablet Take 1 tablet (20 mEq total) by mouth daily with lunch. 90 tablet 2   prednisoLONE acetate (PRED FORTE) 1 % ophthalmic suspension Place 1 drop into the left eye in the morning.     Probiotic Product (PROBIOTIC DAILY PO) Take 1 capsule by mouth every evening.     rivaroxaban (XARELTO) 20 MG TABS tablet TAKE  1 TABLET(20 MG) BY MOUTH DAILY WITH SUPPER 90 tablet 1   rosuvastatin (CRESTOR) 40 MG tablet Take 1 tablet (40 mg total) by mouth every evening. 90 tablet 2   sacubitril-valsartan (ENTRESTO) 24-26 MG TAKE 1 TABLET BY MOUTH TWICE DAILY 180 tablet 0   triamcinolone (KENALOG) 0.1 % Apply 1 application topically 2 (two) times daily as needed (skin irritation).     No current facility-administered medications for this visit.     Past Medical History:  Diagnosis Date   Adenomatous colon polyp 2009; 06/2012   Colonoscopy 07/2007---Dr. Barron Schmid GI--Repeat 06/2012 showed one tubular adenoma w/out high grade dysplasia.   AICD (automatic cardioverter/defibrillator) present    Chemical engineer   Atrial fibrillation ([place])    Bilateral sensorineural hearing loss    BPH (benign prostatic hypertrophy)    PSAs ok (followed by urologist): no LUTS   CAD (coronary artery disease)    PTCA RCA 1997 (Inf/post MI);   CHF (congestive heart failure) ([place])    Chronic constipation    Chronic rhinitis    ?vasomotor (Dr. Erik Obey)   COPD (chronic obstructive pulmonary disease) ([place])    Diverticulosis of colon 2005   Noted on Colonoscopies in 2005 and 2009 and on CT 2010   Gallstones 06/2014   No evidence of cholecystitis on abd u/s 06/2014   GERD (gastroesophageal reflux disease)    Hepatic steatosis 2010; 2016   Noted on CT abd 2010 and on u/s abd 04/2009 (transaminasemia) and u/s 06/2014.   Hyperlipidemia    Hypertension    Ischemic cardiomyopathy    Microscopic hematuria    Cysto neg 02/2009 except for BPH-likely has microhem from his renal stone dz   Nephrolithiasis    No distal stones on CT 2010; stable 8 mm RLP stone, 58m LMP stone 10/2013 plain film.  Abd u/s 06/2014: 9 mm nonobstructing lower pole right renal stone.  03/2015 urol: stable nonobstructing renal calc by CT 12/2014 and KUB 03/2015.   Obstructive sleep apnea    no cpap uses since weight loss of 50 pounds   Presence of permanent cardiac  pacemaker    [employer]   Squamous cell carcinoma of back    Ventricular tachycardia     Past Surgical History:  Procedure Laterality Date   ATRIAL ABLATION SURGERY     flutter ablation 2002   BREAST SURGERY Left 1990   "Tissue removed from breast"   [place] 05/[place]   Procedure: CARDIOVERSION;  Surgeon: CSanda Klein MD;  [facility];  Service: Cardiovascular;  Laterality: N/A;   carotid doppler  09/25/12   No signif stenosis   COLONOSCOPY N/A 07/13/2012   Diverticula ascending colon.  Polyp x 1-recall 5 yrs (Dr. MWatt Climes Procedure: COLONOSCOPY;  Surgeon: MJeryl Columbia MD;  Location: [facility];  Service: Endoscopy;  Laterality: N/A; polypectomy x 1    COLONOSCOPY WITH PROPOFOL N/A 08/18/2015   Procedure: COLONOSCOPY WITH PROPOFOL;  Surgeon: KMauri Pole MD;  Location: [facility];  Service: Endoscopy;  Laterality:  N/A;   ESOPHAGOGASTRODUODENOSCOPY N/A 07/13/2012   Procedure: ESOPHAGOGASTRODUODENOSCOPY (EGD);  Surgeon: Jeryl Columbia, MD;  Location: [facility];  Service: Endoscopy;  Laterality: N/A;  NORMAL   EXTRACORPOREAL SHOCK WAVE LITHOTRIPSY     HEMORRHOID BANDING     ICD     a) Guidant Contak H170- Virl Axe, MD 07/25/06 (second device) b) Medtronic CRT-D   ICD GENERATOR CHANGEOUT N/A 02/14/2020   Procedure: ICD GENERATOR CHANGEOUT;  Surgeon: Deboraha Sprang, MD;  Location: [place] [facility];  Service: Cardiovascular;  Laterality: N/A;   INGUINAL HERNIA REPAIR Bilateral 1975   LEAD REVISION Bilateral 12/26/2011   Procedure: LEAD REVISION;  Surgeon: Evans Lance, MD;  Location: [facility];  Service: Cardiovascular;  Laterality: Bilateral;   PFTs  02/2015   Mod restriction (interstitial), mod diffusion defect (Dr. Annamaria Boots)   precancer area keratsis removed  05/2015    leftf [place] and left arm and right arm and back    Social History   Socioeconomic History   Marital status: Married    Spouse name: Not on  file   Number of children: 3   Years of education: Not on file   Highest education level: Not on file  Occupational History   Occupation: Retired    Fish farm manager: RETIRED  Tobacco Use   Smoking status: Former    Packs/day: 2.00    Years: 15.00    Pack years: 30.00    Types: Cigarettes    Quit date: 01/15/1980    Years since quitting: 40.9   Smokeless tobacco: Never  Vaping Use   Vaping Use: Never used  Substance and Sexual Activity   Alcohol use: No    Alcohol/week: 0.0 standard drinks   Drug use: No   Sexual activity: Yes  Other Topics Concern   Not on file  Social History Narrative   Married.   Hx of cigarettes: quit in the 1980s.  No alc/drugs.   Social Determinants of Health   Financial Resource Strain: Low Risk    Difficulty of Paying Living Expenses: Not hard at all  Food Insecurity: No Food Insecurity   Worried About Charity fundraiser in the Last Year: Never true   [place] in the Last Year: Never true  Transportation Needs: No Transportation Needs   Lack of Transportation (Medical): No   Lack of Transportation (Non-Medical): No  Physical Activity: Sufficiently Active   Days of Exercise per Week: 4 days   Minutes of Exercise per Session: 40 min  Stress: No Stress Concern Present   Feeling of Stress : Not at all  Social Connections: Moderately Isolated   Frequency of Communication with Friends and Family: Three times a week   Frequency of Social Gatherings with Friends and Family: More than three times a week   Attends Religious Services: Never   Marine scientist or Organizations: No   Attends Music therapist: Never   Marital Status: Married  Human resources officer Violence: Not At Risk   Fear of Current or Ex-Partner: No   Emotionally Abused: No   Physically Abused: No   Sexually Abused: No    Family History  Problem Relation Age of Onset   Heart disease Mother    Other Mother        colon surgery for fistula   Gallstones Mother     Breast cancer Maternal Grandmother    Coronary artery disease Father        family hx  of   Heart attack Father        4 stents/ pacemaker   Nephrolithiasis Father    Hypertension Sister    Goiter Paternal Grandmother    Nephrolithiasis Son    Colon cancer Neg Hx     ROS: no fevers or chills, productive cough, hemoptysis, dysphasia, odynophagia, melena, hematochezia, dysuria, hematuria, rash, seizure activity, orthopnea, PND, pedal edema, claudication. Remaining systems are negative.  Physical Exam: Well-developed well-nourished in no acute distress.  Skin is warm and dry.  HEENT is normal.  Neck is supple.  Chest is clear to auscultation with normal expansion.  Cardiovascular exam is regular rate and rhythm.  Abdominal exam nontender or distended. No masses palpated. Extremities show no edema. neuro grossly intact  ECG-ventricular paced rhythm personally reviewed  A/P  1 permanent atrial fibrillation-continue carvedilol for rate control.  Continue Xarelto.  2 mixed ischemic/nonischemic cardiomyopathy-continue Entresto and carvedilol.  His blood pressure has been borderline at home and he is complaining of fatigue.  Decrease carvedilol to 3.125 mg twice daily.    3 coronary artery disease-previous nuclear study showed infarct but no ischemia.  Continue statin.  Is not on aspirin given need for anticoagulation.  4 chronic systolic congestive heart failure-he is euvolemic.  Continue diuretic at present dose.  I will asked the pharmacist to review his medication list.  If there is no interaction with his other medications are like to begin Farxiga 10 mg daily.  If so we will check potassium and renal function 1 week later.  5 hypertension-blood pressure controlled.  Continue present medications.  6 history of ventricular tachycardia-amiodarone previously discontinued due to potential lung toxicity.  We will continue beta-blocker and mexiletine.  7 hyperlipidemia-continue  statin.  8 biventricular ICD-followed by electrophysiology.  Kirk Ruths, MD

## 2020-12-21 ENCOUNTER — Telehealth: Payer: Medicare Other | Admitting: Internal Medicine

## 2020-12-21 ENCOUNTER — Encounter: Payer: Self-pay | Admitting: Internal Medicine

## 2020-12-21 ENCOUNTER — Ambulatory Visit (INDEPENDENT_AMBULATORY_CARE_PROVIDER_SITE_OTHER): Payer: Medicare Other | Admitting: Internal Medicine

## 2020-12-21 ENCOUNTER — Other Ambulatory Visit: Payer: Self-pay

## 2020-12-21 VITALS — BP 108/70 | HR 70 | Ht 70.0 in | Wt 207.8 lb

## 2020-12-21 DIAGNOSIS — Z7189 Other specified counseling: Secondary | ICD-10-CM

## 2020-12-21 DIAGNOSIS — J84112 Idiopathic pulmonary fibrosis: Secondary | ICD-10-CM | POA: Diagnosis not present

## 2020-12-21 DIAGNOSIS — I255 Ischemic cardiomyopathy: Secondary | ICD-10-CM

## 2020-12-21 NOTE — Progress Notes (Signed)
ROV 02/19/18 --80 year old man who follows up today for mixed obstruction and restriction, COPD as well as stable bilateral lower lobe interstitial disease.  We questioned possible amiodarone toxicity and this was stopped.  He had a repeat high-resolution CT scan of the chest on 02/17/2018 that I reviewed.  This shows some mild emphysematous change irregular peripheral interstitial disease with some mild associated bronchiectasis, no significant honeycomb change.  Unchanged compared with his prior 1 year ago.  Pulmonary function testing done on 02/09/2018 shows mild mixed restriction and obstruction, overall stable compared with his prior 1 year ago.        ROV 04/23/19 --[name] is 60 and has a history of mixed obstruction and restriction on pulmonary function testing, COPD as well as stable bilateral lower lobe interstitial disease.  His last CT scan was 02/17/2018 (stable).  He was previously on amiodarone, this has been stopped for several years.  Currently managed on Symbicort, is using albuterol very rarely He reports that he is having some imbalance, feels unstable w ambulation, has had some falls.  He is having more cough, mucous production over the last several months. He has a lot of nasal gtt - uses Atrovent NS with improvement. He is on flonase daily, loratadine.   Chest x-ray from 04/15/2019 reviewed by me, shows persistent interstitial markings particularly in the right midlung. No significant change or increase noted when comparing with prior films.   ROV 09/27/20 --follow-up visit 80 year old gentleman whom we have followed for mixed obstruction and restriction due to COPD and also bilateral lower lobe interstitial disease question amiodarone toxicity.  Autoimmune panel in October 2017 was negative.  He is now in atrial fibrillation almost all the time, notices that his dyspnea is associated with tachycardia.  Currently managed on Symbicort, albuterol which he is using 2-3x a month.   He is having a lot of cough and nasal congestion / drainage. He wonders if the flonase is part of the problem, was told to do atrovent up to tid to see if he gets benefit.   Most recent CT chest performed 09/22/2020 reviewed by me, shows persistent patchy groundglass attenuation and septal thickening in the subpleural regions without any definite honeycomb, possibly slightly progressed compared with 02/17/2018.  Pulmonary function testing performed today and reviewed by me, showed mixed restriction and obstruction with an FEV1 of 1.9 865% predicted and no bronchodilator response.  Significant decrease in FEV1 compared with 02/09/2018.  Restricted lung volumes, decreased diffusion capacity that does correct when adjusted for his alveolar volume.  OV 10/05/2020 -transfer of care to the ILD center with Dr. Chase Caller.  Patient being referred by Dr. Baltazar Apo  Subjective:  Patient ID: [name], male , DOB: 1940-05-13 , age 53 y.o. , MRN: [id number] , ADDRESS: [place] [id number] PCP [name], MD Patient Care Team: [name], MD as PCP - General (Family Medicine) Festus Aloe, MD as Consulting Physician (Urology) Michael Boston, MD as Consulting Physician (General Surgery) Stanford Breed, Denice Bors, MD as Consulting Physician (Cardiology) Jodi Marble, MD as Consulting Physician (Otolaryngology) Deboraha Sprang, MD as Consulting Physician (Cardiology) Luberta Mutter, MD as Consulting Physician (Ophthalmology) Lamonte Sakai Rose Fillers, MD as Consulting Physician (Pulmonary Disease) Mauri Pole, MD as Consulting Physician (Gastroenterology) [name], [facility] as Pharmacist (Pharmacist)  This Provider for this visit: Treatment Team:  Attending Provider: Brand Males, MD    10/05/2020 -   Chief  Complaint  Patient presents with   Follow-up    Pt states that he does have complaints of SOB that can happen at any time but is worse with exertion.      HPI [name] 80 y.o. -referred by Dr. Baltazar Apo.  History is provided by the patient and also review of the medical records.  Patient tells me he is aware that he has ILD.  He has a significant symptom burden.  He says he is recently been told his ILD is gotten worse.  He was on amiodarone for many years.  Then approximately 18 months ago he stopped it because he got worried about the drug.  Dr. Lamonte Sakai notes indicate that patient has combined emphysema and interstitial lung disease.  His pulmonary function test profile shows 20% decline in FVC in the last 5 years or so with most of it coming in the last 2 years.  High-resolution CT chest only shows some decline.  High-resolution CT chest personally visualized by me and is probable UIP.  His serologies are negative.  He is a former smoker having quit over 40 years ago.  He is Caucasian and his age is greater than 47.  Male gender.  He definitely has progressive symptoms.  He really wants a good symptom control.  He also wants understand his disease.  Today most of the focus was spent on helping him understand interstitial lung disease.  I explicitly told him that I would not be able to address symptom burden today.  He will also switch his comprehensive pulmonary care to see Dr. Chase Caller myself.  He has had an MI in the past.  His atrial fibrillation is on Eliquis.  He is no longer on amiodarone.  His walking desaturation test did not show desaturation more than 3 points.   CT Chest data Sept 2022  Narrative & Impression  CLINICAL DATA:  80 year old male with history of COPD. Evaluate for interstitial lung disease.   EXAM: CT CHEST WITHOUT CONTRAST   TECHNIQUE: Multidetector CT imaging of the chest was performed following the standard protocol without intravenous contrast. High resolution imaging of the lungs, as well as inspiratory and expiratory imaging, was performed.   COMPARISON:  Chest CT 02/17/2018.    FINDINGS: Cardiovascular: Heart size is normal. There is no significant pericardial fluid, thickening or pericardial calcification. There is aortic atherosclerosis, as well as atherosclerosis of the great vessels of the mediastinum and the coronary arteries, including calcified atherosclerotic plaque in the left main, left anterior descending, left circumflex and right coronary arteries. Calcifications of the aortic valve. Curvilinear calcifications associated with the myocardium of the inferior wall of the left ventricle in the mid ventricle and base, likely sequela of prior left circumflex coronary artery territory myocardial infarction(s). Left-sided biventricular pacemaker/AICD with lead tips terminating in the right atrial appendage, right ventricular apex, and overlying the lateral wall the left ventricle via the coronary sinus and coronary veins.   Mediastinum/Nodes: No pathologically enlarged mediastinal or hilar lymph nodes. Please note that accurate exclusion of hilar adenopathy is limited on noncontrast CT scans. Esophagus is unremarkable in appearance. No axillary lymphadenopathy.   Lungs/Pleura: High-resolution images again demonstrate some patchy areas of ground-glass attenuation, septal thickening, subpleural reticulation, mild traction bronchiectasis and peripheral bronchiolectasis in the lungs bilaterally. No definite honeycombing. These findings have a mild craniocaudal gradient. Inspiratory and expiratory imaging is unremarkable. No acute consolidative airspace disease. No pleural effusions. Chronic elevation of the right hemidiaphragm is again noted. No definite  suspicious appearing pulmonary nodules or masses are noted.   Upper Abdomen: Low-attenuation lesions are noted in the left lobe of the liver, incompletely characterized on today's non-contrast CT examination, but similar to the prior study and measuring up to 1.3 cm in diameter in segment 2,  statistically likely to represent cysts.   Musculoskeletal: There are no aggressive appearing lytic or blastic lesions noted in the visualized portions of the skeleton.   IMPRESSION: 1. The appearance of the lungs is compatible with interstitial lung disease, categorized as probable usual interstitial pneumonia (UIP) per current ATS guidelines. Findings appear mildly progressive compared to the prior study. 2. Aortic atherosclerosis, in addition to left main and 3 vessel coronary artery disease. Assessment for potential risk factor modification, dietary therapy or pharmacologic therapy may be warranted, if clinically indicated. 3. There are calcifications of the aortic valve. Echocardiographic correlation for evaluation of potential valvular dysfunction may be warranted if clinically indicated.   Aortic Atherosclerosis (ICD10-I70.0).     Electronically Signed   By: Vinnie Langton M.D.   On: 09/22/2020 11:28   No results found.  Simple office walk 185 feet x  3 laps goal with forehead probe 10/05/2020   O2 used ra  Number laps completed 3  Comments about pace avg  Resting Pulse Ox/HR 100% and 70/min  Final Pulse Ox/HR 97% and 82/min  Desaturated </= 88% no  Desaturated <= 3% points yes  Got Tachycardic >/= 90/min no  Symptoms at end of test Mild dyspnea  Miscellaneous comments x      Results for KAMAR, CALLENDER (MRN [id number]) as of 10/05/2020 11:51  Ref. Range 11/14/2015 11:52 09/27/2020 12:19 09/27/2020 13:46  Anti Nuclear Antibody (ANA) Latest Ref Range: NEGATIVE  NEG  NEGATIVE  Anti JO-1 Latest Ref Range: 0.0 - 0.9 AI <8.2    Cyclic Citrullin Peptide Ab Latest Units: UNITS <16  <16  ds DNA Ab Latest Units: IU/mL 1    RA Latex Turbid. Latest Ref Range: <14 IU/mL <14 <14 <14  ENA SM Ab Ser-aCnc Latest Ref Range: <1.0 NEG AI  <1.0 NEG    ANA,IFA RA DIAG PNL W/RFLX TIT/PATN Unknown   Rpt  Ribonucleic Protein(ENA) Antibody, IgG Latest Ref Range: <1.0 NEG AI  <1.0 NEG     SSA (Ro) (ENA) Antibody, IgG Latest Ref Range: <1.0 NEG AI <1.0 NEG <1.0 NEG   SSB (La) (ENA) Antibody, IgG Latest Ref Range: <1.0 NEG AI <1.0 NEG <1.0 NEG   Scleroderma (Scl-70) (ENA) Antibody, IgG Latest Ref Range: <1.0 NEG AI  <1.0 NEG      OV 11/08/2020  Subjective:  Patient ID: [name], male , DOB: [birth year] , age 6 y.o. , MRN: [id number] , ADDRESS: [place] [id number] PCP [name], MD Patient Care Team: [name], MD as PCP - General (Family Medicine) Festus Aloe, MD as Consulting Physician (Urology) Michael Boston, MD as Consulting Physician (General Surgery) Stanford Breed Denice Bors, MD as Consulting Physician (Cardiology) Jodi Marble, MD as Consulting Physician (Otolaryngology) Deboraha Sprang, MD as Consulting Physician (Cardiology) Luberta Mutter, MD as Consulting Physician (Ophthalmology) Lamonte Sakai Rose Fillers, MD as Consulting Physician (Pulmonary Disease) Mauri Pole, MD as Consulting Physician (Gastroenterology) [name], [facility] as Pharmacist (Pharmacist)  This Provider for this visit: Treatment Team:  Attending Provider: Brand Males, MD    11/08/2020 -   Chief Complaint  Patient presents with   Follow-up    Pt states that he has been worried  about taking the Esbriet and states that he has not started taking it yet. States his breathing is about the same.     HPI JENNER ROSIER 80 y.o. - here with wife to discuss disease, Wife is 63 years old. Her name is Myrnal She was a clinical trials coordinator in the late 1990s at [employer] and was involved in oncology and sildenafil trials. Lot of questions on type of fibrosis (IPF), natural course (few to several  yearS), quality of life expectations, anti fibrotics . Wants symptom relief - referred to rehab. HE is interested in support group - gave him contact number. Risk factor - prior smoking and amio. He has not started esbriet yet. Will do on 11/14/20 . This  is becase he and wife went to  reunion. Ate fried food and got diarrhea. Now resolved.   [facility] [facility] Comprehensive ILD Questionnaire    Symptoms:  SYMPTOM SCALE - ILD 11/08/2020  Current weight 209#  O2 use ra  Shortness of Breath 0 -> 5 scale with 5 being worst (score 6 If unable to do)  At rest 0  0Simple tasks - showers, clothes change, eating, shaving 0  Household (dishes, doing bed, laundry) 0  Shopping 3  Walking level at own pace 4  Walking up Stairs 5  Total (30-36) Dyspnea Score 12 x 2 years  same Since onset  How bad is your cough? moderate x  2years. Getting worse. Has light yellow/green sputum. Clears throat  How bad is your fatigue light  How bad is nausea 0  How bad is vomiting?  0  How bad is diarrhea? 0  How bad is anxiety? 0  How bad is depression 0  Any chronic pain - if so where and how bad 0      Past Medical History :  Also copd, cad, chf nos, osa - not using cPAP. Hx of pna > 10 years ago. Hx of renal sones + Has had 5 mRNA covid shots. Last Sept 2022. Never had covid   ROS:  Fatigue + Dry eyes + Gerd +  FAMILY HISTORY of LUNG DISEASE:  denes  PERSONAL EXPOSURE HISTORY:  Smoker [phone]. NOt smoked in 49  years  HOME  EXPOSURE and HOBBY DETAILS :  [place]. 2 of his tile floors have mildew. Not used jacuzzi in 10 years. 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  Patient has underlying COPD and IPF.  CT chest in September 22, 2020 showed probable UIP.  And mildly progressive compared to previous study.  Patient was recommended to start on Esbriet.  Patient says he has started Esbriet and  seems to be tolerating okay.  He has had a couple episodes where he has some loose stools and he took Imodium.  Unfortunately did have some constipation so he also took MiraLAX which caused him to have upset stomach.  We discussed dietary recommendations and the use of Imodium.  Patient says appetite is good.  No nausea vomiting.  Patient is on Mexiletine , his Esbriet dose will need to remain at 534 mg 3 times a day.  Patient is a  been on a tapering dose and has reached his maximum maintenance dose. Patient has shortness of breath with activities.  Remains active.  Gets winded with heavy lifting or prolonged walking.  Has a daily minimally productive cough.   12/06/2020 Follow up ; IPF, COPD  Patient returns for a 2-week follow-up.  Patient has underlying COPD.  Is on Breztri inhaler.  Patient says overall breathing is doing okay.  Feels that it has slightly improved with decreased cough.  Patient also has underlying IPF.  Recently started on Esbriet.  He is on a lower dose due to drug interactions.  Currently on 534 mg 3 times a day.  Last visit patient was having some increased stomach issues.  We discussed some dietary changes.  Patient is now eating less fried foods and higher protein.  Patient says this is made a huge difference and stomach is much better. Hepatic function panel on November 16, 2020 showed normal LFTs.   TEST/EVENTS :  09/22/2020 High-resolution CT chest compatible with interstitial lung disease probable UIP, mildly progressive compared to previous stud      OV 12/22/2020  Subjective:  Patient ID: [name], male , DOB: December 01, 1940 , age 78 y.o. , MRN: [id number] , ADDRESS: [place] [id number] PCP [name], MD Patient Care Team: [name], MD as PCP - General (Family Medicine) Festus Aloe, MD as Consulting Physician (Urology) Michael Boston, MD as Consulting Physician (General Surgery) Stanford Breed, Denice Bors, MD as  Consulting Physician (Cardiology) Jodi Marble, MD as Consulting Physician (Otolaryngology) Deboraha Sprang, MD as Consulting Physician (Cardiology) Luberta Mutter, MD as Consulting Physician (Ophthalmology) Lamonte Sakai Rose Fillers, MD as Consulting Physician (Pulmonary Disease) Mauri Pole, MD as Consulting Physician (Gastroenterology) [name], [facility] as Pharmacist (Pharmacist)  This Provider for this visit: Treatment Team:  Attending Provider: Brand Males, MD   IPF dx given Sept 2022 -> started esbriet  12/22/2020 -   Chief Complaint  Patient presents with   Follow-up     HPI LAWRANCE WIEDEMANN 80 y.o. presents with his wife.-since giving him the diagnosis of IPF and recommending pirfenidone over nintedanib [he has heart disease] he see nurse practitioner 2x1 for a face-to-face visit not a video visit.  He is on 2 pills 3 times a day of pirfenidone.  This is because he is on mexiletine [antiarrhythmic that can also cause abdominal pain, headache and nausea] because of this and CYPa12 mechanism that could inhibit pirfenidone metabolism resulting in elevated levels of pirfenidone he has been kept on 2 pills 3 times daily.  He is upset that this was not picked up by the pulmonary pharmacist but only by the cardiology pharmacist.  He reports because of this he is lost a little bit of confidence in [place].  Overall though he says that he has chronic alternating diarrhea with constipation.  He takes Imodium for the diarrhea and then this resolves and constipation.  Then he has to take MiraLAX.  This is at his baseline despite being on Esbriet it is not worse.  He says he is just frustrated by this alternating constipation and diarrhea.  Between diarrhea and constipation he does not favor constipation.  The only side effect he is noticing from [place] is that he is more fatigued.  Otherwise he is overall stable as seen by the symptom score below other than fatigue.  He is also worried because he  is on submaximal doses of pirfenidone that his disease could  progress.  I did explain to him that even at 2 pills 3 times daily it is beneficial and with inhibitor effects of mexiletine levels are slightly higher and could potentially reflect similar to being on 3 pills 3 times daily.  Did explain to him that he might not be able to tolerate 3 pills 3 times daily.  Given his cardiac issues nintedanib currently as second line.  We had a shared understanding and agreement on this approach.    Symptoms:  SYMPTOM SCALE - ILD 11/08/2020 12/22/2020   Current weight 209# 207#  O2 use ra ra  Shortness of Breath 0 -> 5 scale with 5 being worst (score 6 If unable to do)   At rest 0 0  0Simple tasks - showers, clothes change, eating, shaving 0 0  Household (dishes, doing bed, laundry) 0 0  Shopping 3 2  Walking level at own pace 4 2  Walking up Stairs 5 5  Total (30-36) Dyspnea Score 12 x 2 years  same Since onset 9  How bad is your cough? moderate x  2years. Getting worse. Has light yellow/green sputum. Clears throat 1  How bad is your fatigue light 5  How bad is nausea 0 1  How bad is vomiting?  0 0  How bad is diarrhea? 0 4  How bad is anxiety? 0 4  How bad is depression 0 1  Any chronic pain - if so where and how bad 0    CT Chest data  No results found.    PFT  PFT Results Latest Ref Rng & Units 09/27/2020 02/09/2018 03/12/2017 02/08/2015  FVC-Pre L 2.34 3.02 2.82 3.20  FVC-Predicted Pre % 55 69 64 71  FVC-Post L 2.44 2.97 2.78 3.19  FVC-Predicted Post % 57 67 63 71  Pre FEV1/FVC % % 84 82 82 82  Post FEV1/FCV % % 82 82 83 82  FEV1-Pre L 1.98 2.48 2.30 2.61  FEV1-Predicted Pre % 65 78 72 80  FEV1-Post L 2.00 2.42 2.30 2.61  DLCO uncorrected ml/min/mmHg 14.62 18.10 18.46 19.06  DLCO UNC% % 57 53 54 56  DLCO corrected ml/min/mmHg 14.62 17.90 - -  DLCO COR %Predicted % 57 53 - -  DLVA Predicted % 101 90 95 90  TLC L 4.17 4.65 4.94 4.59  TLC % Predicted % 57 64 68 63  RV %  Predicted % 46 56 68 44   No results for input(s): AST, ALT, ALKPHOS, BILITOT, PROT, ALBUMIN, INR in the last 168 hours.  No results for input(s): HGB, HCT, WBC, PLT in the last 168 hours.    has a past medical history of Adenomatous colon polyp (2009; 06/2012), AICD (automatic cardioverter/defibrillator) present, Atrial fibrillation ([place]), Bilateral sensorineural hearing loss, BPH (benign prostatic hypertrophy), CAD (coronary artery disease), CHF (congestive heart failure) ([place]), Chronic constipation, Chronic rhinitis, COPD (chronic obstructive pulmonary disease) ([place]), Diverticulosis of colon (2005), Gallstones (06/2014), GERD (gastroesophageal reflux disease), Hepatic steatosis (2010; 2016), Hyperlipidemia, Hypertension, Ischemic cardiomyopathy, Microscopic hematuria, Nephrolithiasis, Obstructive sleep apnea, Presence of permanent cardiac pacemaker, Squamous cell carcinoma of back, and Ventricular tachycardia.   reports that he quit smoking about 40 years ago. His smoking use included cigarettes. He has a 30.00 pack-year smoking history. He has never used smokeless tobacco.  Past Surgical History:  Procedure Laterality Date   ATRIAL ABLATION SURGERY     flutter ablation 2002   BREAST SURGERY Left 1990   "Tissue removed from breast"   CARDIAC CATHETERIZATION  CARDIOVERSION N/A 05/10/2020   Procedure: CARDIOVERSION;  Surgeon: Sanda Klein, MD;  Location: [facility];  Service: Cardiovascular;  Laterality: N/A;   carotid doppler  09/25/12   No signif stenosis   COLONOSCOPY N/A 07/13/2012   Diverticula ascending colon.  Polyp x 1-recall 5 yrs (Dr. Watt Climes) Procedure: COLONOSCOPY;  Surgeon: Jeryl Columbia, MD;  Location: [facility];  Service: Endoscopy;  Laterality: N/A; polypectomy x 1    COLONOSCOPY WITH PROPOFOL N/A 08/18/2015   Procedure: COLONOSCOPY WITH PROPOFOL;  Surgeon: Mauri Pole, MD;  Location: [facility] ENDOS[facility];  Service: Endoscopy;  Laterality: N/A;   ESOPHAGOGASTRODUODENOSCOPY  N/A 07/13/2012   Procedure: ESOPHAGOGASTRODUODENOSCOPY (EGD);  Surgeon: Jeryl Columbia, MD;  Location: [facility];  Service: Endoscopy;  Laterality: N/A;  NORMAL   EXTRACORPOREAL SHOCK WAVE LITHOTRIPSY     HEMORRHOID BANDING     ICD     a) Guidant Contak H170- Virl Axe, MD 07/25/06 (second device) b) Medtronic CRT-D   ICD GENERATOR CHANGEOUT N/A 02/14/2020   Procedure: ICD GENERATOR CHANGEOUT;  Surgeon: Deboraha Sprang, MD;  Location: [place] [facility] LAB;  Service: Cardiovascular;  Laterality: N/A;   INGUINAL HERNIA REPAIR Bilateral 1975   LEAD REVISION Bilateral 12/26/2011   Procedure: LEAD REVISION;  Surgeon: Evans Lance, MD;  Location: [facility] CATH LAB;  Service: Cardiovascular;  Laterality: Bilateral;   PFTs  02/2015   Mod restriction (interstitial), mod diffusion defect (Dr. Annamaria Boots)   precancer area keratsis removed  05/2015    leftf orehead and left arm and right arm and back    Allergies  Allergen Reactions   Sulfa Antibiotics Rash    Immunization History  Administered Date(s) Administered   Fluad Quad(high Dose 65+) 09/23/2018, 10/05/2020   Influenza Split 11/11/2010, 11/02/2012, 10/14/2013   Influenza Whole 09/27/2011, 11/13/2011   Influenza, High Dose Seasonal PF 09/26/2015, 10/07/2016   Influenza, Seasonal, Injecte, Preservative Fre 09/23/2018   Influenza,inj,Quad PF,6+ Mos 11/03/2016, 09/20/2017   Influenza-Unspecified 09/26/2014, 10/15/2019   PFIZER Comirnaty(Gray Top)Covid-19 Tri-Sucrose Vaccine 04/24/2020   PFIZER(Purple Top)SARS-COV-2 Vaccination 02/02/2019, 02/22/2019, 10/26/2019, 09/29/2020   Pneumococcal Conjugate-13 06/15/2014   Pneumococcal Polysaccharide-23 03/15/2002, 07/10/2015   Td 08/27/2017   Tdap 04/04/2010   Zoster Recombinat (Shingrix) 03/29/2016   Zoster, Live 05/15/2015    Family History  Problem Relation Age of Onset   Heart disease Mother    Other Mother        colon surgery for fistula   Gallstones Mother    Breast cancer Maternal  Grandmother    Coronary artery disease Father        family hx of   Heart attack Father        4 stents/ pacemaker   Nephrolithiasis Father    Hypertension Sister    Goiter Paternal Grandmother    Nephrolithiasis Son    Colon cancer Neg Hx      Current Outpatient Medications:    antiseptic oral rinse (BIOTENE) LIQD, 15 mLs by Mouth Rinse route in the morning and at bedtime., Disp: , Rfl:    Budeson-Glycopyrrol-Formoterol (BREZTRI AEROSPHERE) 160-9-4.8 MCG/ACT AERO, Inhale 2 puffs into the lungs in the morning and at bedtime., Disp: 5.9 g, Rfl: 5   carvedilol (COREG) 6.25 MG tablet, TAKE 1 TABLET(6.25 MG) BY MOUTH TWICE DAILY WITH A MEAL, Disp: 180 tablet, Rfl: 3   cholecalciferol (VITAMIN D) 1000 units tablet, Take 1,000 Units by mouth every evening., Disp: , Rfl:    eplerenone (INSPRA) 25 MG tablet, Take 1 tablet (25 mg total)  by mouth daily., Disp: 30 tablet, Rfl: 11   furosemide (LASIX) 40 MG tablet, TAKE 2 TABLETS BY MOUTH EVERY DAY, Disp: 90 tablet, Rfl: 3   ipratropium (ATROVENT) 0.06 % nasal spray, Place 2 sprays into both nostrils 3 (three) times daily., Disp: 15 mL, Rfl: 1   levalbuterol (XOPENEX HFA) 45 MCG/ACT inhaler, Inhale 2 puffs into the lungs every 4 (four) hours as needed for wheezing., Disp: 1 each, Rfl: 12   loratadine (CLARITIN) 10 MG tablet, Take 10 mg by mouth in the morning., Disp: , Rfl:    Magnesium 250 MG TABS, Take 250 mg by mouth every evening., Disp: , Rfl:    mexiletine (MEXITIL) 150 MG capsule, TAKE 2 CAPSULES(300 MG) BY MOUTH TWICE DAILY, Disp: 360 capsule, Rfl: 2   Multiple Vitamin (MULTIVITAMIN WITH MINERALS) TABS, Take 1 tablet by mouth every evening., Disp: , Rfl:    nitroGLYCERIN (NITROSTAT) 0.4 MG SL tablet, DISSOLVE 1 TABLET UNDER THE TONGUE EVERY 5 MINUTES AS NEEDED FOR CHEST PAIN (Patient taking differently: Place 0.4 mg under the tongue every 5 (five) minutes x 3 doses as needed for chest pain.), Disp: 25 tablet, Rfl: 5   Pirfenidone (ESBRIET) 267  MG TABS, Take 1 tab three times daily for 7 days, then 2 tabs three times daily for 7 days, then 3 tabs three times daily thereafter., Disp: 207 tablet, Rfl: 0   Pirfenidone (ESBRIET) 267 MG TABS, Take 2 tablets (534 mg total) by mouth with breakfast, with lunch, and with evening meal., Disp: 180 tablet, Rfl: 1   polyvinyl alcohol-povidone (HYPOTEARS) 1.4-0.6 % ophthalmic solution, Place 1-2 drops into both eyes in the morning and at bedtime., Disp: , Rfl:    potassium chloride SA (KLOR-CON) 20 MEQ tablet, Take 1 tablet (20 mEq total) by mouth daily with lunch., Disp: 90 tablet, Rfl: 2   prednisoLONE acetate (PRED FORTE) 1 % ophthalmic suspension, Place 1 drop into the left eye in the morning., Disp: , Rfl:    Probiotic Product (PROBIOTIC DAILY PO), Take 1 capsule by mouth every evening., Disp: , Rfl:    rivaroxaban (XARELTO) 20 MG TABS tablet, TAKE 1 TABLET(20 MG) BY MOUTH DAILY WITH SUPPER, Disp: 90 tablet, Rfl: 1   rosuvastatin (CRESTOR) 40 MG tablet, Take 1 tablet (40 mg total) by mouth every evening., Disp: 90 tablet, Rfl: 2   sacubitril-valsartan (ENTRESTO) 24-26 MG, TAKE 1 TABLET BY MOUTH TWICE DAILY, Disp: 180 tablet, Rfl: 0   triamcinolone (KENALOG) 0.1 %, Apply 1 application topically 2 (two) times daily as needed (skin irritation)., Disp: , Rfl:       Objective:   Vitals:   12/21/20 1429  BP: 108/70  Pulse: 70  SpO2: 99%  Weight: 207 lb 12.8 oz (94.3 kg)  Height: _0  (1.778 m)    Estimated body mass index is 29.82 kg/m as calculated from the following:   Height as of this encounter: _1  (1.778 m).   Weight as of this encounter: 207 lb 12.8 oz (94.3 kg).  _2 @  Filed Weights   12/21/20 1429  Weight: 207 lb 12.8 oz (94.3 kg)     Physical Exam  General: No distress. Looks well Neuro: Alert and Oriented x 3. GCS 15. Speech normal Psych: Pleasant Resp:  Barrel Chest - no.  Wheeze - no, Crackles - yes, No overt respiratory distress CVS: Normal heart  sounds.  Ext: Stigmata of Connective Tissue Disease - no HEENT: Normal upper airway. PEERL +. No post nasal drip  Assessment:       ICD-10-CM   1. IPF (idiopathic pulmonary fibrosis) ([place])  J84.112     2. Encounter for medication counseling  Z71.89          Plan:     Patient Instructions  Pulmnary Fibrosis with emphysema ILD (interstitial lung disease) ([place]) IPF (idiopathic pulmonary fibrosis) ([place])  - incrreased fatigue due to esbriet - baseline gi side effects - on esbriet 2 pills tid   - being capped because baseline mexilitine can increase esbriet levels which then increases side effects of fatigue and gi   Plan - continue esbiret 2 pills three times daily with food and 5-6h apart and wear sunscreen  - check LFT 12/21/2020 - to avoid ping pong of diarrhea/constipation - avoid sugars, drink water, go on schedule with miralax  - keep PFT appt JAn 2023   Chronic obstructive pulmonary disease, unspecified COPD type ([place])  - not in flare up  Plan  - cotninue breztri daily - albueo as needed    Follolwup (make all appointments now)  - Jan 2023 with Reginae Wolfrey after PFT; ILD symptm score and walk test at followup    SIGNATURE    Dr. Brand Males, M.D., F.C.C.P,  Pulmonary and Critical Care Medicine Staff Physician, [place] Director - Interstitial Lung Disease  Program  [facility] [place] [facility], [place], [address]  Pager: [phone], If no answer or between  15:00h - 7:00h: call [phone] Telephone: [phone]  5:04 PM 12/22/2020

## 2020-12-21 NOTE — Patient Instructions (Addendum)
Pulmnary Fibrosis with emphysema ILD (interstitial lung disease) ([place]) IPF (idiopathic pulmonary fibrosis) ([place])  - incrreased fatigue due to esbriet - baseline gi side effects - on esbriet 2 pills tid   - being capped because baseline mexilitine can increase esbriet levels which then increases side effects of fatigue and gi   Plan - continue esbiret 2 pills three times daily with food and 5-6h apart and wear sunscreen  - check LFT 12/21/2020 - to avoid ping pong of diarrhea/constipation - avoid sugars, drink water, go on schedule with miralax  - keep PFT appt JAn 2023   Chronic obstructive pulmonary disease, unspecified COPD type ([place])  - not in flare up  Plan  - cotninue breztri daily - albueo as needed    Follolwup (make all appointments now)  - Jan 2023 with Dustie Brittle after PFT; ILD symptm score and walk test at followup

## 2020-12-27 ENCOUNTER — Encounter: Payer: Self-pay | Admitting: Cardiology

## 2020-12-27 ENCOUNTER — Encounter: Payer: Self-pay | Admitting: *Deleted

## 2020-12-27 ENCOUNTER — Other Ambulatory Visit: Payer: Self-pay

## 2020-12-27 ENCOUNTER — Ambulatory Visit (INDEPENDENT_AMBULATORY_CARE_PROVIDER_SITE_OTHER): Payer: Medicare Other | Admitting: Cardiology

## 2020-12-27 VITALS — BP 120/70 | HR 70 | Ht 70.0 in | Wt 203.8 lb

## 2020-12-27 DIAGNOSIS — I4821 Permanent atrial fibrillation: Secondary | ICD-10-CM

## 2020-12-27 DIAGNOSIS — Z4502 Encounter for adjustment and management of automatic implantable cardiac defibrillator: Secondary | ICD-10-CM

## 2020-12-27 DIAGNOSIS — I2510 Atherosclerotic heart disease of native coronary artery without angina pectoris: Secondary | ICD-10-CM

## 2020-12-27 DIAGNOSIS — I255 Ischemic cardiomyopathy: Secondary | ICD-10-CM | POA: Diagnosis not present

## 2020-12-27 MED ORDER — CARVEDILOL 3.125 MG PO TABS: 3.1250 mg | ORAL_TABLET | Freq: Two times a day (BID) | ORAL | 3 refills | Status: DC

## 2020-12-27 NOTE — Telephone Encounter (Signed)
-----  Message from Rockne Menghini, [place] sent at 12/27/2020  4:37 PM EST ----- No issues.  Wilder Glade has very few medication interactions that are significant.   Erasmo Downer ----- Message ----- From: Cristopher Estimable, RN Sent: 12/27/2020   2:42 PM EST To: Cv Div Pharmd  Dr Stanford Breed ask if you would review this patients medications to see if there are any interactions with farxiga 10 mg daily.

## 2020-12-27 NOTE — Telephone Encounter (Signed)
This encounter was created in error - please disregard.

## 2020-12-27 NOTE — Patient Instructions (Signed)
Medication Instructions:   DECREASE CARVEDILOL TO 3.125 MG TWICE DAILY= 1/2 OF THE 6.25 MG TABLETS TWICE DAILY  *If you need a refill on your cardiac medications before your next appointment, please call your pharmacy   Follow-Up: At [facility], you and your health needs are our priority.  As part of our continuing mission to provide you with exceptional heart care, we have created designated Provider Care Teams.  These Care Teams include your primary Cardiologist (physician) and Advanced Practice Providers (APPs -  Physician Assistants and Nurse Practitioners) who all work together to provide you with the care you need, when you need it.  We recommend signing up for the patient portal called "MyChart".  Sign up information is provided on this After Visit Summary.  MyChart is used to connect with patients for Virtual Visits (Telemedicine).  Patients are able to view lab/test results, encounter notes, upcoming appointments, etc.  Non-urgent messages can be sent to your provider as well.   To learn more about what you can do with MyChart, go to [email or web address].    Your next appointment:   6 month(s)  The format for your next appointment:   In Person  Provider:    Kirk Ruths MD

## 2020-12-29 ENCOUNTER — Telehealth: Payer: Self-pay | Admitting: *Deleted

## 2020-12-29 NOTE — Telephone Encounter (Signed)
Spoke with pt, per last office note, patient is to start [place] but itis not on the preferred list and jardiace is. Will forward to dr Stanford Breed to make sure jardiance is okay to prescribe as well. Will make sure no drug interactions with pharm md as well.

## 2020-12-29 NOTE — Telephone Encounter (Signed)
-----  Message from Rockne Menghini, [place] sent at 12/27/2020  4:37 PM EST ----- No issues.  Wilder Glade has very few medication interactions that are significant.   Erasmo Downer ----- Message ----- From: Cristopher Estimable, RN Sent: 12/27/2020   2:42 PM EST To: Cv Div Pharmd  Dr Stanford Breed ask if you would review this patients medications to see if there are any interactions with farxiga 10 mg daily.

## 2021-01-01 ENCOUNTER — Other Ambulatory Visit (INDEPENDENT_AMBULATORY_CARE_PROVIDER_SITE_OTHER): Payer: Medicare Other

## 2021-01-01 DIAGNOSIS — Z85828 Personal history of other malignant neoplasm of skin: Secondary | ICD-10-CM | POA: Diagnosis not present

## 2021-01-01 DIAGNOSIS — B372 Candidiasis of skin and nail: Secondary | ICD-10-CM | POA: Diagnosis not present

## 2021-01-01 DIAGNOSIS — J84112 Idiopathic pulmonary fibrosis: Secondary | ICD-10-CM

## 2021-01-01 LAB — HEPATIC FUNCTION PANEL
ALT: 45 U/L (ref 0–53)
AST: 44 U/L — ABNORMAL HIGH (ref 0–37)
Albumin: 3.9 g/dL (ref 3.5–5.2)
Alkaline Phosphatase: 64 U/L (ref 39–117)
Bilirubin, Direct: 0.2 mg/dL (ref 0.0–0.3)
Total Bilirubin: 0.4 mg/dL (ref 0.2–1.2)
Total Protein: 6.6 g/dL (ref 6.0–8.3)

## 2021-01-01 NOTE — Progress Notes (Signed)
Called and spoke with patient, provided results/recommendations per [name] NP, he verbalized understanding.  He has a PFT and OV scheduled with MR in early January.  Nothing further needed.

## 2021-01-01 NOTE — Telephone Encounter (Signed)
Spoke with pt, Aware of dr Jacalyn Lefevre recommendations. He reports he was at the dermatologist and he is being treated for a yeast infection. He would like to wait until this is cleared before starting this new medication. Okay given to wait and he will call and let me know when he is ready to start.

## 2021-01-12 ENCOUNTER — Other Ambulatory Visit: Payer: Self-pay | Admitting: Cardiology

## 2021-01-12 NOTE — Telephone Encounter (Signed)
Xarelto 20 mg refill request received. Pt is 80 years old, weight- 92.4 kg, Crea- 1.01 on 11/16/20, last seen by Dr. Stanford Breed on  12/27/20, Diagnosis-afib, CrCl- 77.51; Dose is appropriate based on dosing criteria. Will send in refill to requested pharmacy.

## 2021-01-18 ENCOUNTER — Other Ambulatory Visit: Payer: Self-pay | Admitting: *Deleted

## 2021-01-18 ENCOUNTER — Telehealth: Payer: Medicare Other | Admitting: Internal Medicine

## 2021-01-18 DIAGNOSIS — J84112 Idiopathic pulmonary fibrosis: Secondary | ICD-10-CM

## 2021-01-19 ENCOUNTER — Telehealth: Payer: Self-pay | Admitting: Internal Medicine

## 2021-01-19 ENCOUNTER — Ambulatory Visit (INDEPENDENT_AMBULATORY_CARE_PROVIDER_SITE_OTHER): Payer: Medicare Other | Admitting: Internal Medicine

## 2021-01-19 ENCOUNTER — Other Ambulatory Visit: Payer: Self-pay

## 2021-01-19 ENCOUNTER — Encounter: Payer: Self-pay | Admitting: Internal Medicine

## 2021-01-19 VITALS — BP 128/74 | HR 70 | Temp 97.5°F | Ht 71.0 in | Wt 206.2 lb

## 2021-01-19 DIAGNOSIS — R198 Other specified symptoms and signs involving the digestive system and abdomen: Secondary | ICD-10-CM

## 2021-01-19 DIAGNOSIS — Z5181 Encounter for therapeutic drug level monitoring: Secondary | ICD-10-CM | POA: Diagnosis not present

## 2021-01-19 DIAGNOSIS — K219 Gastro-esophageal reflux disease without esophagitis: Secondary | ICD-10-CM

## 2021-01-19 DIAGNOSIS — J84112 Idiopathic pulmonary fibrosis: Secondary | ICD-10-CM

## 2021-01-19 DIAGNOSIS — J439 Emphysema, unspecified: Secondary | ICD-10-CM | POA: Diagnosis not present

## 2021-01-19 DIAGNOSIS — J8410 Pulmonary fibrosis, unspecified: Secondary | ICD-10-CM

## 2021-01-19 LAB — PULMONARY FUNCTION TEST
DL/VA % pred: 93 %
DL/VA: 3.64 ml/min/mmHg/L
DLCO cor % pred: 55 %
DLCO cor: 14.09 ml/min/mmHg
DLCO unc % pred: 55 %
DLCO unc: 14.09 ml/min/mmHg
FEF 25-75 Pre: 2.18 L/sec
FEF2575-%Pred-Pre: 103 %
FEV1-%Pred-Pre: 72 %
FEV1-Pre: 2.19 L
FEV1FVC-%Pred-Pre: 114 %
FEV6-%Pred-Pre: 67 %
FEV6-Pre: 2.67 L
FEV6FVC-%Pred-Pre: 106 %
FVC-%Pred-Pre: 63 %
FVC-Pre: 2.67 L
Pre FEV1/FVC ratio: 82 %
Pre FEV6/FVC Ratio: 100 %

## 2021-01-19 LAB — BASIC METABOLIC PANEL
BUN: 14 mg/dL (ref 6–23)
CO2: 27 mEq/L (ref 19–32)
Calcium: 9.1 mg/dL (ref 8.4–10.5)
Chloride: 103 mEq/L (ref 96–112)
Creatinine, Ser: 0.95 mg/dL (ref 0.40–1.50)
GFR: 76.23 mL/min (ref >60.00)
Glucose, Bld: 97 mg/dL (ref 70–99)
Potassium: 4.2 mEq/L (ref 3.5–5.1)
Sodium: 138 mEq/L (ref 135–145)

## 2021-01-19 LAB — CBC WITH DIFFERENTIAL/PLATELET
Basophils Absolute: 0.1 10*3/uL (ref 0.0–0.1)
Basophils Relative: 0.7 % (ref 0.0–3.0)
Eosinophils Absolute: 0.4 10*3/uL (ref 0.0–0.7)
Eosinophils Relative: 5.2 % — ABNORMAL HIGH (ref 0.0–5.0)
HCT: 45.1 % (ref 39.0–52.0)
Hemoglobin: 14.8 g/dL (ref 13.0–17.0)
Lymphocytes Relative: 29.3 % (ref 12.0–46.0)
Lymphs Abs: 2.2 10*3/uL (ref 0.7–4.0)
MCHC: 32.9 g/dL (ref 30.0–36.0)
MCV: 102.9 fl — ABNORMAL HIGH (ref 78.0–100.0)
Monocytes Absolute: 0.7 10*3/uL (ref 0.1–1.0)
Monocytes Relative: 9.1 % (ref 3.0–12.0)
Neutro Abs: 4.2 10*3/uL (ref 1.4–7.7)
Neutrophils Relative %: 55.7 % (ref 43.0–77.0)
Platelets: 282 10*3/uL (ref 150.0–400.0)
RBC: 4.38 Mil/uL (ref 4.22–5.81)
RDW: 14.2 % (ref 11.5–15.5)
WBC: 7.5 10*3/uL (ref 4.0–10.5)

## 2021-01-19 LAB — HEPATIC FUNCTION PANEL
ALT: 27 U/L (ref 0–53)
AST: 24 U/L (ref 0–37)
Albumin: 4.1 g/dL (ref 3.5–5.2)
Alkaline Phosphatase: 55 U/L (ref 39–117)
Bilirubin, Direct: 0.1 mg/dL (ref 0.0–0.3)
Total Bilirubin: 0.5 mg/dL (ref 0.2–1.2)
Total Protein: 6.9 g/dL (ref 6.0–8.3)

## 2021-01-19 NOTE — Telephone Encounter (Signed)
Liver function test is normal.  Plan - Stop Esbriet for 1 week and then follow the plan as outlined in today's office note  Recent Labs  Lab 01/19/21 1453  AST 24  ALT 27  ALKPHOS 55  BILITOT 0.5  PROT 6.9  ALBUMIN 4.1

## 2021-01-19 NOTE — Patient Instructions (Addendum)
Pulmnary Fibrosis with emphysema ILD (interstitial lung disease) ([place]) IPF (idiopathic pulmonary fibrosis) ([facility])  - IPF is stable since last visit.  -Currently on Esbriet 2 pills 3 times daily   -  being capped because baseline mexilitine can increase esbriet levels which then increases side effects of fatigue and gi - baseline gi side effects likely be worse with esbriet but also made worse due to ginger ale and lack of acid reflux maintenance  -High normal transaminitis on liver function test 01/01/2021  Plan - check LFT, cbc and bmet 01/19/2021 -Stop Esbriet till further notice - at least a week -Go back to taking MiraLAX every other day -Stop ginger ale [can definitely contribute to diarrhea and acid reflux] -If we decide to restart Esbriet [provided liver function test is normal] then we will restart with 1 pill 3 times daily and a week later go back to 2 pills 3 times daily  -During this time take acid reflux medication omeprazole 20 mg daily on empty stomach  - to avoid ping pong of diarrhea/constipation - avoid sugars, drink water,   -Take ginger capsules [not ginger ale] for nausea and other GI side effects   Chronic obstructive pulmonary disease, unspecified COPD type ([place])  - not in flare up  Plan  - cotninue breztri daily - albueo as needed    Follolwup (make all appointments now)  - Video visit with nurse practitioner in 4 weeks

## 2021-01-19 NOTE — Progress Notes (Signed)
Spirometry and Dlco done today.

## 2021-01-19 NOTE — Progress Notes (Signed)
ROV 02/19/18 --81 year old man who follows up today for mixed obstruction and restriction, COPD as well as stable bilateral lower lobe interstitial disease.  We questioned possible amiodarone toxicity and this was stopped.  He had a repeat high-resolution CT scan of the chest on 02/17/2018 that I reviewed.  This shows some mild emphysematous change irregular peripheral interstitial disease with some mild associated bronchiectasis, no significant honeycomb change.  Unchanged compared with his prior 1 year ago.  Pulmonary function testing done on 02/09/2018 shows mild mixed restriction and obstruction, overall stable compared with his prior 1 year ago.        ROV 04/23/19 --[name] is 81 and has a history of mixed obstruction and restriction on pulmonary function testing, COPD as well as stable bilateral lower lobe interstitial disease.  His last CT scan was 02/17/2018 (stable).  He was previously on amiodarone, this has been stopped for several years.  Currently managed on Symbicort, is using albuterol very rarely He reports that he is having some imbalance, feels unstable w ambulation, has had some falls.  He is having more cough, mucous production over the last several months. He has a lot of nasal gtt - uses Atrovent NS with improvement. He is on flonase daily, loratadine.   Chest x-ray from 04/15/2019 reviewed by me, shows persistent interstitial markings particularly in the right midlung. No significant change or increase noted when comparing with prior films.   ROV 09/27/20 --follow-up visit 81 year old gentleman whom we have followed for mixed obstruction and restriction due to COPD and also bilateral lower lobe interstitial disease question amiodarone toxicity.  Autoimmune panel in October 2017 was negative.  He is now in atrial fibrillation almost all the time, notices that his dyspnea is associated with tachycardia.  Currently managed on Symbicort, albuterol which he is using 2-3x a month.   He is having a lot of cough and nasal congestion / drainage. He wonders if the flonase is part of the problem, was told to do atrovent up to tid to see if he gets benefit.   Most recent CT chest performed 09/22/2020 reviewed by me, shows persistent patchy groundglass attenuation and septal thickening in the subpleural regions without any definite honeycomb, possibly slightly progressed compared with 02/17/2018.  Pulmonary function testing performed today and reviewed by me, showed mixed restriction and obstruction with an FEV1 of 1.9 865% predicted and no bronchodilator response.  Significant decrease in FEV1 compared with 02/09/2018.  Restricted lung volumes, decreased diffusion capacity that does correct when adjusted for his alveolar volume.  OV 10/05/2020 -transfer of care to the ILD center with Dr. Chase Caller.  Patient being referred by Dr. Baltazar Apo  Subjective:  Patient ID: [name], male , DOB: December 04, 1940 , age 81 y.o. , MRN: [id number] , ADDRESS: [place] [id number] PCP Joel Minium, MD Patient Care Team: Joel Minium, MD as PCP - General (Family Medicine) Joel Aloe, MD as Consulting Physician (Urology) Joel Boston, MD as Consulting Physician (General Surgery) [name], Joel Bors, MD as Consulting Physician (Cardiology) Joel Marble, MD as Consulting Physician (Otolaryngology) Joel Sprang, MD as Consulting Physician (Cardiology) Joel Mutter, MD as Consulting Physician (Ophthalmology) Joel Sakai Rose Fillers, MD as Consulting Physician (Pulmonary Disease) Joel Pole, MD as Consulting Physician (Gastroenterology) [name], [facility] as Pharmacist (Pharmacist)  This Provider for this visit: Treatment Team:  Attending Provider: Brand Males, MD    10/05/2020 -   Chief  Complaint  Patient presents with   Follow-up    Pt states that he does have complaints of SOB that can happen at any time but is worse with exertion.      Joel [name] 81 y.o. -referred by Dr. Baltazar Apo.  History is provided by the patient and also review of the medical records.  Patient tells me he is aware that he has ILD.  He has a significant symptom burden.  He says he is recently been told his ILD is gotten worse.  He was on amiodarone for many years.  Then approximately 18 months ago he stopped it because he got worried about the drug.  Dr. Lamonte Sakai notes indicate that patient has combined emphysema and interstitial lung disease.  His pulmonary function test profile shows 20% decline in FVC in the last 5 years or so with most of it coming in the last 2 years.  High-resolution CT chest only shows some decline.  High-resolution CT chest personally visualized by me and is probable UIP.  His serologies are negative.  He is a former smoker having quit over 40 years ago.  He is Caucasian and his age is greater than 40.  Male gender.  He definitely has progressive symptoms.  He really wants a good symptom control.  He also wants understand his disease.  Today most of the focus was spent on helping him understand interstitial lung disease.  I explicitly told him that I would not be able to address symptom burden today.  He will also switch his comprehensive pulmonary care to see Dr. Chase Caller myself.  He has had an MI in the past.  His atrial fibrillation is on Eliquis.  He is no longer on amiodarone.  His walking desaturation test did not show desaturation more than 3 points.   CT Chest data Sept 2022  Narrative & Impression  CLINICAL DATA:  81 year old male with history of COPD. Evaluate for interstitial lung disease.   EXAM: CT CHEST WITHOUT CONTRAST   TECHNIQUE: Multidetector CT imaging of the chest was performed following the standard protocol without intravenous contrast. High resolution imaging of the lungs, as well as inspiratory and expiratory imaging, was performed.   COMPARISON:  Chest CT 02/17/2018.    FINDINGS: Cardiovascular: Heart size is normal. There is no significant pericardial fluid, thickening or pericardial calcification. There is aortic atherosclerosis, as well as atherosclerosis of the great vessels of the mediastinum and the coronary arteries, including calcified atherosclerotic plaque in the left main, left anterior descending, left circumflex and right coronary arteries. Calcifications of the aortic valve. Curvilinear calcifications associated with the myocardium of the inferior wall of the left ventricle in the mid ventricle and base, likely sequela of prior left circumflex coronary artery territory myocardial infarction(s). Left-sided biventricular pacemaker/AICD with lead tips terminating in the right atrial appendage, right ventricular apex, and overlying the lateral wall the left ventricle via the coronary sinus and coronary veins.   Mediastinum/Nodes: No pathologically enlarged mediastinal or hilar lymph nodes. Please note that accurate exclusion of hilar adenopathy is limited on noncontrast CT scans. Esophagus is unremarkable in appearance. No axillary lymphadenopathy.   Lungs/Pleura: High-resolution images again demonstrate some patchy areas of ground-glass attenuation, septal thickening, subpleural reticulation, mild traction bronchiectasis and peripheral bronchiolectasis in the lungs bilaterally. No definite honeycombing. These findings have a mild craniocaudal gradient. Inspiratory and expiratory imaging is unremarkable. No acute consolidative airspace disease. No pleural effusions. Chronic elevation of the right hemidiaphragm is again noted. No definite  suspicious appearing pulmonary nodules or masses are noted.   Upper Abdomen: Low-attenuation lesions are noted in the left lobe of the liver, incompletely characterized on today's non-contrast CT examination, but similar to the prior study and measuring up to 1.3 cm in diameter in segment 2,  statistically likely to represent cysts.   Musculoskeletal: There are no aggressive appearing lytic or blastic lesions noted in the visualized portions of the skeleton.   IMPRESSION: 1. The appearance of the lungs is compatible with interstitial lung disease, categorized as probable usual interstitial pneumonia (UIP) per current ATS guidelines. Findings appear mildly progressive compared to the prior study. 2. Aortic atherosclerosis, in addition to left main and 3 vessel coronary artery disease. Assessment for potential risk factor modification, dietary therapy or pharmacologic therapy may be warranted, if clinically indicated. 3. There are calcifications of the aortic valve. Echocardiographic correlation for evaluation of potential valvular dysfunction may be warranted if clinically indicated.   Aortic Atherosclerosis (ICD10-I70.0).     Electronically Signed   By: Vinnie Langton M.D.   On: 09/22/2020 11:28   No results found.  Simple office walk 185 feet x  3 laps goal with forehead probe 10/05/2020   O2 used ra  Number laps completed 3  Comments about pace avg  Resting Pulse Ox/HR 100% and 70/min  Final Pulse Ox/HR 97% and 82/min  Desaturated </= 88% no  Desaturated <= 3% points yes  Got Tachycardic >/= 90/min no  Symptoms at end of test Mild dyspnea  Miscellaneous comments x      Results for KINNICK, MAUS (MRN [id number]) as of 10/05/2020 11:51  Ref. Range 11/14/2015 11:52 09/27/2020 12:19 09/27/2020 13:46  Anti Nuclear Antibody (ANA) Latest Ref Range: NEGATIVE  NEG  NEGATIVE  Anti JO-1 Latest Ref Range: 0.0 - 0.9 AI <3.5    Cyclic Citrullin Peptide Ab Latest Units: UNITS <16  <16  ds DNA Ab Latest Units: IU/mL 1    RA Latex Turbid. Latest Ref Range: <14 IU/mL <14 <14 <14  ENA SM Ab Ser-aCnc Latest Ref Range: <1.0 NEG AI  <1.0 NEG    ANA,IFA RA DIAG PNL W/RFLX TIT/PATN Unknown   Rpt  Ribonucleic Protein(ENA) Antibody, IgG Latest Ref Range: <1.0 NEG AI  <1.0 NEG     SSA (Ro) (ENA) Antibody, IgG Latest Ref Range: <1.0 NEG AI <1.0 NEG <1.0 NEG   SSB (La) (ENA) Antibody, IgG Latest Ref Range: <1.0 NEG AI <1.0 NEG <1.0 NEG   Scleroderma (Scl-70) (ENA) Antibody, IgG Latest Ref Range: <1.0 NEG AI  <1.0 NEG      OV 11/08/2020  Subjective:  Patient ID: [name], male , DOB: 01-19-1940 , age 5 y.o. , MRN: [id number] , ADDRESS: [place] [id number] PCP Joel Minium, MD Patient Care Team: Joel Minium, MD as PCP - General (Family Medicine) Joel Aloe, MD as Consulting Physician (Urology) Joel Boston, MD as Consulting Physician (General Surgery) [name] Joel Bors, MD as Consulting Physician (Cardiology) Joel Marble, MD as Consulting Physician (Otolaryngology) Joel Sprang, MD as Consulting Physician (Cardiology) Joel Mutter, MD as Consulting Physician (Ophthalmology) Joel Sakai Rose Fillers, MD as Consulting Physician (Pulmonary Disease) Joel Pole, MD as Consulting Physician (Gastroenterology) [name], [facility] as Pharmacist (Pharmacist)  This Provider for this visit: Treatment Team:  Attending Provider: Brand Males, MD    11/08/2020 -   Chief Complaint  Patient presents with   Follow-up    Pt states that he has been worried  about taking the Esbriet and states that he has not started taking it yet. States his breathing is about the same.     Joel [name] 81 y.o. - here with wife to discuss disease, Wife is 40 years old. Her name is Myrnal She was a clinical trials coordinator in the late 1990s at [employer] and was involved in oncology and sildenafil trials. Lot of questions on type of fibrosis (IPF), natural course (few to several  yearS), quality of life expectations, anti fibrotics . Wants symptom relief - referred to rehab. HE is interested in support group - gave him contact number. Risk factor - prior smoking and amio. He has not started esbriet yet. Will do on 11/14/20 . This  is becase he and wife went to  reunion. Ate fried food and got diarrhea. Now resolved.   [facility] Comprehensive ILD Questionnaire    Symptoms:  SYMPTOM SCALE - ILD 11/08/2020  Current weight 209#  O2 use ra  Shortness of Breath 0 -> 5 scale with 5 being worst (score 6 If unable to do)  At rest 0  0Simple tasks - showers, clothes change, eating, shaving 0  Household (dishes, doing bed, laundry) 0  Shopping 3  Walking level at own pace 4  Walking up Stairs 5  Total (30-36) Dyspnea Score 12 x 2 years  same Since onset  How bad is your cough? moderate x  2years. Getting worse. Has light yellow/green sputum. Clears throat  How bad is your fatigue light  How bad is nausea 0  How bad is vomiting?  0  How bad is diarrhea? 0  How bad is anxiety? 0  How bad is depression 0  Any chronic pain - if so where and how bad 0      Past Medical History :  Also copd, cad, chf nos, osa - not using cPAP. Hx of pna > 10 years ago. Hx of renal sones + Has had 5 mRNA covid shots. Last Sept 2022. Never had covid   ROS:  Fatigue + Dry eyes + Gerd +  FAMILY HISTORY of LUNG DISEASE:  denes  PERSONAL EXPOSURE HISTORY:  Smoker [phone]. NOt smoked in 49  years  HOME  EXPOSURE and HOBBY DETAILS :  [place]. 2 of his tile floors have mildew. Not used jacuzzi in 10 years. 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  Patient has underlying COPD and IPF.  CT chest in September 22, 2020 showed probable UIP.  And mildly progressive compared to previous study.  Patient was recommended to start on Esbriet.  Patient says he has started Esbriet and  seems to be tolerating okay.  He has had a couple episodes where he has some loose stools and he took Imodium.  Unfortunately did have some constipation so he also took MiraLAX which caused him to have upset stomach.  We discussed dietary recommendations and the use of Imodium.  Patient says appetite is good.  No nausea vomiting.  Patient is on Mexiletine , his Esbriet dose will need to remain at 534 mg 3 times a day.  Patient is a  been on a tapering dose and has reached his maximum maintenance dose. Patient has shortness of breath with activities.  Remains active.  Gets winded with heavy lifting or prolonged walking.  Has a daily minimally productive cough.   12/06/2020 Follow up ; IPF, COPD  Patient returns for a 2-week follow-up.  Patient has underlying COPD.  Is on Breztri inhaler.  Patient says overall breathing is doing okay.  Feels that it has slightly improved with decreased cough.  Patient also has underlying IPF.  Recently started on Esbriet.  He is on a lower dose due to drug interactions.  Currently on 534 mg 3 times a day.  Last visit patient was having some increased stomach issues.  We discussed some dietary changes.  Patient is now eating less fried foods and higher protein.  Patient says this is made a huge difference and stomach is much better. Hepatic function panel on November 16, 2020 showed normal LFTs.   TEST/EVENTS :  09/22/2020 High-resolution CT chest compatible with interstitial lung disease probable UIP, mildly progressive compared to previous stud      OV 12/22/2020  Subjective:  Patient ID: [name], male , DOB: 25-Jul-1940 , age 74 y.o. , MRN: [id number] , ADDRESS: [place] [id number] PCP Joel Minium, MD Patient Care Team: Joel Minium, MD as PCP - General (Family Medicine) Joel Aloe, MD as Consulting Physician (Urology) Joel Boston, MD as Consulting Physician (General Surgery) [name], Joel Bors, MD as  Consulting Physician (Cardiology) Joel Marble, MD as Consulting Physician (Otolaryngology) Joel Sprang, MD as Consulting Physician (Cardiology) Joel Mutter, MD as Consulting Physician (Ophthalmology) Joel Sakai Rose Fillers, MD as Consulting Physician (Pulmonary Disease) Joel Pole, MD as Consulting Physician (Gastroenterology) [name], [facility] as Pharmacist (Pharmacist)  This Provider for this visit: Treatment Team:  Attending Provider: Brand Males, MD   IPF dx given Sept 2022 -> started esbriet  12/22/2020 -   Chief Complaint  Patient presents with   Follow-up     Joel [name] 81 y.o. presents with his wife.-since giving him the diagnosis of IPF and recommending pirfenidone over nintedanib [he has heart disease] he see nurse practitioner 2x1 for a face-to-face visit not a video visit.  He is on 2 pills 3 times a day of pirfenidone.  This is because he is on mexiletine [antiarrhythmic that can also cause abdominal pain, headache and nausea] because of this and CYPa12 mechanism that could inhibit pirfenidone metabolism resulting in elevated levels of pirfenidone he has been kept on 2 pills 3 times daily.  He is upset that this was not picked up by the pulmonary pharmacist but only by the cardiology pharmacist.  He reports because of this he is lost a little bit of confidence in [place].  Overall though he says that he has chronic alternating diarrhea with constipation.  He takes Imodium for the diarrhea and then this resolves and constipation.  Then he has to take MiraLAX.  This is at his baseline despite being on Esbriet it is not worse.  He says he is just frustrated by this alternating constipation and diarrhea.  Between diarrhea and constipation he does not favor constipation.  The only side effect he is noticing from [place] is that he is more fatigued.  Otherwise he is overall stable as seen by the symptom score below other than fatigue.  He is also worried because he  is on submaximal doses of pirfenidone that his disease could  progress.  I did explain to him that even at 2 pills 3 times daily it is beneficial and with inhibitor effects of mexiletine levels are slightly higher and could potentially reflect similar to being on 3 pills 3 times daily.  Did explain to him that he might not be able to tolerate 3 pills 3 times daily.  Given his cardiac issues nintedanib currently as second line.  We had a shared understanding and agreement on this approach.  No results found.       OV 01/19/2021  Subjective:  Patient ID: [name], male , DOB: Jan 28, 1940 , age 40 y.o. , MRN: [id number] , ADDRESS: [place] [id number] PCP Joel Minium, MD Patient Care Team: Joel Minium, MD as PCP - General (Family Medicine) Joel Aloe, MD as Consulting Physician (Urology) Joel Boston, MD as Consulting Physician (General Surgery) [name] Joel Bors, MD as Consulting Physician (Cardiology) Joel Marble, MD as Consulting Physician (Otolaryngology) Joel Sprang, MD as Consulting Physician (Cardiology) Joel Mutter, MD as Consulting Physician (Ophthalmology) Joel Sakai Rose Fillers, MD as Consulting Physician (Pulmonary Disease) Joel Pole, MD as Consulting Physician (Gastroenterology) [name], [facility] as Pharmacist (Pharmacist)  This Provider for this visit: Treatment Team:  Attending Provider: Brand Males, MD    01/19/2021 -   Chief Complaint  Patient presents with   Follow-up    PFT performed today.  Pt states that he noticed with last bloodwork that his liver enzymes were elevated and also states that he has noticed some yellowing of skin.  IPF dx given Sept 2022 -> started esbriet   [name] 81 y.o. -returns for follow-up.  This visit is to see his pulmonary function test and his uptake with pirfenidone.  His pulmonary function test shows stability/slight improvement from September 2022 and  similar to his previous baseline.  In terms of pirfenidone intake he continues to have alternating diarrhea and constipation.  In talking to him in more detail he says before his Esbriet he used to take MiraLAX once every other day and have a bowel movement [hard stool] every day once.  After this starting as.  He is now taking his MiraLAX only 1/week.  1 day he has diarrhea in 4 days he is got constipation without any bowel movement.  Therefore he is not taking his MiraLAX for his previous schedule which we discussed at the last visit.  In addition he is lost 1 pound of weight.  He is also reporting intermittent gaseous symptoms bloating acid reflux sour taste.  He takes Tums for this.  He is not taking anything for acid reflux such as PPI or H2 blockade.  He is just taking Tums.  We did discuss the idea of taking ginger capsules but he drinks ginger ale 1 bottle every day.  Did indicate to him even in previous visit that sugary drinks can cause diarrhea and potentially a carbonated drink can contribute to acid reflux.  He is agreed to stop this.  I showed him an example of a ginger capsule that he could buy at [facility] or convenience store.  Of note his Esbriet intake is 2 pills 3 times daily due to drug interaction with mexiletine. For associated emphysema is on inhaler therapy.  Of note: His AST on 01/01/2021 was 44.  Other lab calls is abnormal even though it is below 45.  He was informed of this results.  The plan is to retest today.  He does  tell me that he and his wife noticed that 1 day a few weeks ago that his left finger was yellow and after that it turned out to be normal after a few days.  Due to concerns of potential jaundice and worsening LFTs Arava and the ongoing GI side effects have asked him to stop his Esbriet till further notice or 1 week   Symptoms:  SYMPTOM SCALE - ILD 11/08/2020 12/22/2020 Esbriet since sept/pct 2022 01/19/2021 Esbreit 2 pills tid  Current weight 209# 207# 206#  O2  use ra ra   Shortness of Breath 0 -> 5 scale with 5 being worst (score 6 If unable to do)    At rest 0 0 0  0Simple tasks - showers, clothes change, eating, shaving 0 0 0  Household (dishes, doing bed, laundry) 0 0 0  Shopping 3 2 0  Walking level at own pace _0 Walking up Stairs _1 Total (30-36) Dyspnea Score 12 x 2 years  same Since onset 9 5  How bad is your cough? moderate x  2years. Getting worse. Has light yellow/green sputum. Clears throat 1 4  How bad is your fatigue light 5 4  How bad is nausea 0 1 2  How bad is vomiting?  0 0 0  How bad is diarrhea? 0 4 5  How bad is anxiety? 0 4 3  How bad is depression 0 1 2  Any chronic pain - if so where and how bad 0     CT Chest data   PFT  PFT Results Latest Ref Rng & Units 01/19/2021 09/27/2020 02/09/2018 03/12/2017 02/08/2015  FVC-Pre L 2.67 2.34 3.02 2.82 3.20  FVC-Predicted Pre % 63 55 69 64 71  FVC-Post L - 2.44 2.97 2.78 3.19  FVC-Predicted Post % - 57 67 63 71  Pre FEV1/FVC % % 82 84 82 82 82  Post FEV1/FCV % % - 82 82 83 82  FEV1-Pre L 2.19 1.98 2.48 2.30 2.61  FEV1-Predicted Pre % 72 65 78 72 80  FEV1-Post L - 2.00 2.42 2.30 2.61  DLCO uncorrected ml/min/mmHg 14.09 14.62 18.10 18.46 19.06  DLCO UNC% % 55 57 53 54 56  DLCO corrected ml/min/mmHg 14.09 14.62 17.90 - -  DLCO COR %Predicted % 55 57 53 - -  DLVA Predicted % 93 101 90 95 90  TLC L - 4.17 4.65 4.94 4.59  TLC % Predicted % - 57 64 68 63  RV % Predicted % - 46 56 68 44       has a past medical history of Adenomatous colon polyp (2009; 06/2012), AICD (automatic cardioverter/defibrillator) present, Atrial fibrillation (HCC), Bilateral sensorineural hearing loss, BPH (benign prostatic hypertrophy), CAD (coronary artery disease), CHF (congestive heart failure) (HCC), Chronic constipation, Chronic rhinitis, COPD (chronic obstructive pulmonary disease) ([place]), Diverticulosis of colon (2005), Gallstones (06/2014), GERD (gastroesophageal reflux disease), Hepatic  steatosis (2010; 2016), Hyperlipidemia, Hypertension, Ischemic cardiomyopathy, Microscopic hematuria, Nephrolithiasis, Obstructive sleep apnea, Presence of permanent cardiac pacemaker, Squamous cell carcinoma of back, and Ventricular tachycardia.   reports that he quit smoking about 41 years ago. His smoking use included cigarettes. He has a 30.00 pack-year smoking history. He has never used smokeless tobacco.  Past Surgical History:  Procedure Laterality Date   ATRIAL ABLATION SURGERY     flutter ablation 2002   BREAST SURGERY Left 1990   "Tissue removed from breast"   [place] N/A 05/10/2020  Procedure: CARDIOVERSION;  Surgeon: Sanda Klein, MD;  Location: [facility] ENDOSCOPY;  Service: Cardiovascular;  Laterality: N/A;   carotid doppler  09/25/12   No signif stenosis   COLONOSCOPY N/A 07/13/2012   Diverticula ascending colon.  Polyp x 1-recall 5 yrs (Dr. Watt Climes) Procedure: COLONOSCOPY;  Surgeon: Jeryl Columbia, MD;  Location: [facility] ENDOSCOPY;  Service: Endoscopy;  Laterality: N/A; polypectomy x 1    COLONOSCOPY WITH PROPOFOL N/A 08/18/2015   Procedure: COLONOSCOPY WITH PROPOFOL;  Surgeon: Joel Pole, MD;  Location: [facility];  Service: Endoscopy;  Laterality: N/A;   ESOPHAGOGASTRODUODENOSCOPY N/A 07/13/2012   Procedure: ESOPHAGOGASTRODUODENOSCOPY (EGD);  Surgeon: Jeryl Columbia, MD;  Location: [facility];  Service: Endoscopy;  Laterality: N/A;  NORMAL   EXTRACORPOREAL SHOCK WAVE LITHOTRIPSY     HEMORRHOID BANDING     ICD     a) Guidant Contak H170- Virl Axe, MD 07/25/06 (second device) b) Medtronic CRT-D   ICD GENERATOR CHANGEOUT N/A 02/14/2020   Procedure: ICD GENERATOR CHANGEOUT;  Surgeon: Joel Sprang, MD;  Location: [place] [facility];  Service: Cardiovascular;  Laterality: N/A;   INGUINAL HERNIA REPAIR Bilateral 1975   LEAD REVISION Bilateral 12/26/2011   Procedure: LEAD REVISION;  Surgeon: Evans Lance, MD;  Location: [facility] CATH [facility];  Service:  Cardiovascular;  Laterality: Bilateral;   PFTs  02/2015   Mod restriction (interstitial), mod diffusion defect (Dr. Annamaria Boots)   precancer area keratsis removed  05/2015    leftf orehead and left arm and right arm and back    Allergies  Allergen Reactions   Sulfa Antibiotics Rash    Immunization History  Administered Date(s) Administered   Fluad Quad(high Dose 65+) 09/23/2018, 10/05/2020   Influenza Split 11/11/2010, 11/02/2012, 10/14/2013   Influenza Whole 09/27/2011, 11/13/2011   Influenza, High Dose Seasonal PF 09/26/2015, 10/07/2016   Influenza, Seasonal, Injecte, Preservative Fre 09/23/2018   Influenza,inj,Quad PF,6+ Mos 11/03/2016, 09/20/2017   Influenza-Unspecified 09/26/2014, 10/15/2019   PFIZER Comirnaty(Gray Top)Covid-19 Tri-Sucrose Vaccine 04/24/2020   PFIZER(Purple Top)SARS-COV-2 Vaccination 02/02/2019, 02/22/2019, 10/26/2019, 09/29/2020   Pneumococcal Conjugate-13 06/15/2014   Pneumococcal Polysaccharide-23 03/15/2002, 07/10/2015   Td 08/27/2017   Tdap 04/04/2010   Zoster Recombinat (Shingrix) 03/29/2016   Zoster, Live 05/15/2015    Family History  Problem Relation Age of Onset   Heart disease Mother    Other Mother        colon surgery for fistula   Gallstones Mother    Breast cancer Maternal Grandmother    Coronary artery disease Father        family hx of   Heart attack Father        4 stents/ pacemaker   Nephrolithiasis Father    Hypertension Sister    Goiter Paternal Grandmother    Nephrolithiasis Son    Colon cancer Neg Hx      Current Outpatient Medications:    antiseptic oral rinse (BIOTENE) LIQD, 15 mLs by Mouth Rinse route in the morning and at bedtime., Disp: , Rfl:    Budeson-Glycopyrrol-Formoterol (BREZTRI AEROSPHERE) 160-9-4.8 MCG/ACT AERO, Inhale 2 puffs into the lungs in the morning and at bedtime., Disp: 5.9 g, Rfl: 5   carvedilol (COREG) 3.125 MG tablet, Take 1 tablet (3.125 mg total) by mouth 2 (two) times daily with a meal., Disp: 180  tablet, Rfl: 3   cholecalciferol (VITAMIN D) 1000 units tablet, Take 1,000 Units by mouth every evening., Disp: , Rfl:    eplerenone (INSPRA) 25 MG tablet, Take 1 tablet (25 mg total) by  mouth daily., Disp: 30 tablet, Rfl: 11   furosemide (LASIX) 40 MG tablet, TAKE 2 TABLETS BY MOUTH EVERY DAY, Disp: 90 tablet, Rfl: 3   ipratropium (ATROVENT) 0.06 % nasal spray, Place 2 sprays into both nostrils 3 (three) times daily., Disp: 15 mL, Rfl: 1   levalbuterol (XOPENEX HFA) 45 MCG/ACT inhaler, Inhale 2 puffs into the lungs every 4 (four) hours as needed for wheezing., Disp: 1 each, Rfl: 12   loratadine (CLARITIN) 10 MG tablet, Take 10 mg by mouth in the morning., Disp: , Rfl:    Magnesium 250 MG TABS, Take 250 mg by mouth every evening., Disp: , Rfl:    mexiletine (MEXITIL) 150 MG capsule, TAKE 2 CAPSULES(300 MG) BY MOUTH TWICE DAILY, Disp: 360 capsule, Rfl: 2   Multiple Vitamin (MULTIVITAMIN WITH MINERALS) TABS, Take 1 tablet by mouth every evening., Disp: , Rfl:    nitroGLYCERIN (NITROSTAT) 0.4 MG SL tablet, DISSOLVE 1 TABLET UNDER THE TONGUE EVERY 5 MINUTES AS NEEDED FOR CHEST PAIN (Patient taking differently: Place 0.4 mg under the tongue every 5 (five) minutes x 3 doses as needed for chest pain.), Disp: 25 tablet, Rfl: 5   Pirfenidone (ESBRIET) 267 MG TABS, Take 2 tablets (534 mg total) by mouth with breakfast, with lunch, and with evening meal., Disp: 180 tablet, Rfl: 1   polyvinyl alcohol-povidone (HYPOTEARS) 1.4-0.6 % ophthalmic solution, Place 1-2 drops into both eyes in the morning and at bedtime., Disp: , Rfl:    potassium chloride SA (KLOR-CON) 20 MEQ tablet, Take 1 tablet (20 mEq total) by mouth daily with lunch., Disp: 90 tablet, Rfl: 2   prednisoLONE acetate (PRED FORTE) 1 % ophthalmic suspension, Place 1 drop into the left eye in the morning., Disp: , Rfl:    rosuvastatin (CRESTOR) 40 MG tablet, Take 1 tablet (40 mg total) by mouth every evening., Disp: 90 tablet, Rfl: 2    sacubitril-valsartan (ENTRESTO) 24-26 MG, TAKE 1 TABLET BY MOUTH TWICE DAILY, Disp: 180 tablet, Rfl: 0   XARELTO 20 MG TABS tablet, TAKE 1 TABLET(20 MG) BY MOUTH DAILY WITH SUPPER, Disp: 90 tablet, Rfl: 1      Objective:   Vitals:   01/19/21 1404  BP: 128/74  Pulse: 70  Temp: (!) 97.5 F (36.4 C)  TempSrc: Oral  SpO2: 98%  Weight: 206 lb 3.2 oz (93.5 kg)  Height: _0  (1.803 m)    Estimated body mass index is 28.76 kg/m as calculated from the following:   Height as of this encounter: _1  (1.803 m).   Weight as of this encounter: 206 lb 3.2 oz (93.5 kg).  _2 @  Filed Weights   01/19/21 1404  Weight: 206 lb 3.2 oz (93.5 kg)     Physical Exam General: No distress. pleasnt Neuro: Alert and Oriented x 3. GCS 15. Speech normal Psych: Pleasant Resp:  Barrel Chest - no.  Wheeze - no, Crackles - yes at base, No overt respiratory distress CVS: Normal heart sounds. Murmurs - no Ext: Stigmata of Connective Tissue Disease - no HEENT: Normal upper airway. PEERL +. No post nasal drip        Assessment:       ICD-10-CM   1. Pulmonary emphysema with fibrosis of lung ([place])  J43.9    J84.10     2. IPF (idiopathic pulmonary fibrosis) (HCC)  J84.112 Hepatic function panel    CBC with Differential/Platelet    Basic metabolic panel    3. Medication monitoring encounter  Z51.81  4. Gastroesophageal reflux disease, unspecified whether esophagitis present  K21.9     5. Alternating constipation and diarrhea  R19.8          Plan:     Patient Instructions  Pulmnary Fibrosis with emphysema ILD (interstitial lung disease) ([place]) IPF (idiopathic pulmonary fibrosis) (HCC)  - IPF is stable since last visit.  -Currently on Esbriet 2 pills 3 times daily   -  being capped because baseline mexilitine can increase esbriet levels which then increases side effects of fatigue and gi - baseline gi side effects likely be worse with esbriet but also made worse due to  ginger ale and lack of acid reflux maintenance  -High normal transaminitis on liver function test 01/01/2021  Plan - check LFT, cbc and bmet 01/19/2021 -Stop Esbriet till further notice - at least a week -Go back to taking MiraLAX every other day -Stop ginger ale [can definitely contribute to diarrhea and acid reflux] -If we decide to restart Esbriet [provided liver function test is normal] then we will restart with 1 pill 3 times daily and a week later go back to 2 pills 3 times daily  -During this time take acid reflux medication omeprazole 20 mg daily on empty stomach  - to avoid ping pong of diarrhea/constipation - avoid sugars, drink water,   -Take ginger capsules [not ginger ale] for nausea and other GI side effects   Chronic obstructive pulmonary disease, unspecified COPD type ([place])  - not in flare up  Plan  - cotninue breztri daily - albueo as needed    Follolwup (make all appointments now)  - Video visit with nurse practitioner in 4 weeks  ( Level 05 visit: Estb 40-54 min   visit type: on-site physical face to visit  in total care time and counseling or/and coordination of care by this undersigned MD - Dr Brand Males. This includes one or more of the following on this same day 01/19/2021: pre-charting, chart review, note writing, documentation discussion of test results, diagnostic or treatment recommendations, prognosis, risks and benefits of management options, instructions, education, compliance or risk-factor reduction. It excludes time spent by the [place] or office staff in the care of the patient. Actual time 40 min)   SIGNATURE    Dr. Brand Males, M.D., F.C.C.P,  Pulmonary and Critical Care Medicine Staff Physician, [place] Director - Interstitial Lung Disease  Program  [facility] [place], [place], [address]  Pager: [phone], If no answer or between  15:00h - 7:00h: call [phone] Telephone: [phone]  2:41 PM 01/19/2021

## 2021-01-20 ENCOUNTER — Other Ambulatory Visit: Payer: Self-pay | Admitting: Cardiology

## 2021-01-20 DIAGNOSIS — I5022 Chronic systolic (congestive) heart failure: Secondary | ICD-10-CM

## 2021-01-22 NOTE — Telephone Encounter (Signed)
Called and spoke with pt letting him know the results of labwork per MR and he verbalized understanding. Nothing further needed.

## 2021-01-26 ENCOUNTER — Telehealth: Payer: Self-pay | Admitting: Internal Medicine

## 2021-01-26 DIAGNOSIS — J84112 Idiopathic pulmonary fibrosis: Secondary | ICD-10-CM

## 2021-01-26 MED ORDER — PIRFENIDONE 267 MG PO TABS: 534.0000 mg | ORAL_TABLET | Freq: Three times a day (TID) | ORAL | 5 refills | Status: DC

## 2021-01-26 NOTE — Telephone Encounter (Signed)
Patient is returning phone call Patient phone number is [phone]. May leave detailed message on voicemail.

## 2021-01-26 NOTE — Telephone Encounter (Signed)
Rx for pt's Esbriet has been sent to preferred pharmacy for pt. Called and spoke with pt letting him know this had been done and he verbalized understanding. Nothing further needed.

## 2021-02-02 ENCOUNTER — Telehealth (HOSPITAL_COMMUNITY): Payer: Self-pay

## 2021-02-02 NOTE — Telephone Encounter (Signed)
Called patient to see if he was interested in participating in the Pulmonary Rehab Program. Patient stated yes. Patient will come in for orientation on 02/28/2021_0 :30am and will attend the 1:15pm exercise class.   Tourist information centre manager.

## 2021-02-02 NOTE — Telephone Encounter (Signed)
Pt insurance is active and benefits verified through Medicare a/b Co-pay 0, DED $226/$226 met, out of pocket 0/0 met, co-insurance 20%. no pre-authorization required.   2ndary insurance is active and benefits verified through [employer]. Co-pay 0, DED 0/0 met, out of pocket 0/0 met, co-insurance 0%. No pre-authorization required.

## 2021-02-14 ENCOUNTER — Ambulatory Visit (INDEPENDENT_AMBULATORY_CARE_PROVIDER_SITE_OTHER): Payer: Medicare Other | Admitting: Pharmacist

## 2021-02-14 DIAGNOSIS — J449 Chronic obstructive pulmonary disease, unspecified: Secondary | ICD-10-CM

## 2021-02-14 DIAGNOSIS — E7800 Pure hypercholesterolemia, unspecified: Secondary | ICD-10-CM

## 2021-02-14 NOTE — Progress Notes (Signed)
Chronic Care Management Pharmacy Note  02/15/2021 Name:  [name] MRN:  [id number] DOB:  June 30, 1940 Subjective: [name] is an 81 y.o. year old male who is a primary patient of Tabori, [name], MD.  The CCM team was consulted for assistance with disease management and care coordination needs.    Engaged with patient by telephone for follow up visit in response to provider referral for pharmacy case management and/or care coordination services.   Consent to Services:  The patient was given information about Chronic Care Management services, agreed to services, and gave verbal consent prior to initiation of services.  Please see initial visit note for detailed documentation.   Patient Care Team: [name], MD as PCP - General (Family Medicine) Festus Aloe, MD as Consulting Physician (Urology) Michael Boston, MD as Consulting Physician (General Surgery) [name], MD as Consulting Physician (Cardiology) Jodi Marble, MD as Consulting Physician (Otolaryngology) Deboraha Sprang, MD as Consulting Physician (Cardiology) Luberta Mutter, MD as Consulting Physician (Ophthalmology) Lamonte Sakai Rose Fillers, MD as Consulting Physician (Pulmonary Disease) Mauri Pole, MD as Consulting Physician (Gastroenterology) [name], [facility] (Pharmacist)  Recent office visits:  11/16/20 [name], MD (PCP) - Family Medicine - Hypertension - Labs were ordered. Referrlal to  Pulmonology placed. IF you have worsening dizziness or falls- decrease your Coreg to 1/2 tab twice daily. Follow up in 6 months.   Recent consult visits:  12/06/20 [name], NP - IPF - Pulmonology - levalbuterol [facility] HFA) 45 MCG/ACT inhaler Inhale 2 puffs into the lungs every 4 (four) hours as needed for wheezing prescribed. Labs ordered. Imodium AD  if needed for diarrhea . Labs in 4 weeks. Follow up in 6 weeks.    11/23/20 [name], NP - IPF - Pulmonology (Video Visit) -  No medication changes. Follow up in 2 weeks for labs.   11/08/20 Brand Males, MD - Pulmonololgy - IPF - Referral to Pulmonary rehab. Start esbiret per protocol 11/14/20. [place]ue breztri daily and albueo as needed. Follow up in 2 weeks.   Objective:  Lab Results  Component Value Date   CREATININE 0.95 01/19/2021   CREATININE 1.01 11/16/2020   CREATININE 0.93 05/17/2020    Lab Results  Component Value Date   HGBA1C 6.0 11/17/2020   Last diabetic Eye exam: No results found for: HMDIABEYEEXA  Last diabetic Foot exam: No results found for: HMDIABFOOTEX      Component Value Date/Time   CHOL 124 11/16/2020 1034   TRIG 98.0 11/16/2020 1034   HDL 34.80 (L) 11/16/2020 1034   CHOLHDL 4 11/16/2020 1034   VLDL 19.6 11/16/2020 1034   LDLCALC 70 11/16/2020 1034   LDLCALC 169 (H) 06/11/2017 1637   LDLDIRECT 168.0 07/12/2015 0928    Hepatic Function Latest Ref Rng & Units 01/19/2021 01/01/2021 11/16/2020  Total Protein 6.0 - 8.3 g/dL 6.9 6.6 6.4  Albumin 3.5 - 5.2 g/dL 4.1 3.9 4.0  AST 0 - 37 U/L 24 44(H) 33  ALT 0 - 53 U/L 27 45 37  Alk Phosphatase 39 - 117 U/L 55 64 50  Total Bilirubin 0.2 - 1.2 mg/dL 0.5 0.4 0.7  Bilirubin, Direct 0.0 - 0.3 mg/dL 0.1 0.2 0.1    Lab Results  Component Value Date/Time   TSH 1.67 11/16/2020 10:34 AM   TSH 2.01 05/17/2020 11:21 AM    CBC Latest Ref Rng & Units 01/19/2021 11/16/2020 05/17/2020  WBC 4.0 - 10.5 K/uL 7.5 7.3 7.8  Hemoglobin 13.0 - 17.0 g/dL 14.8 15.1 14.9  Hematocrit 39.0 - 52.0 % 45.1 44.7 44.3  Platelets 150.0 - 400.0 K/uL 282.0 280.0 286.0    Lab Results  Component Value Date/Time   VD25OH 38.99 11/16/2020 10:34 AM   VD25OH 37.42 11/17/2019 11:11 AM   Clinical ASCVD: Yes  The ASCVD Risk score (Arnett DK, et al., 2019) failed to calculate for the following reasons:   The valid total cholesterol range is 130 to 320 mg/dL    Other: (CHADS2VASc if Afib, PHQ9 if depression, MMRC or CAT for COPD, ACT, DEXA)  Social History    Tobacco Use  Smoking Status Former   Packs/day: 2.00   Years: 15.00   Pack years: 30.00   Types: Cigarettes   Quit date: 01/15/1980   Years since quitting: 41.1  Smokeless Tobacco Never   BP Readings from Last 3 Encounters:  01/19/21 128/74  12/27/20 120/70  12/21/20 108/70   Pulse Readings from Last 3 Encounters:  01/19/21 70  12/27/20 70  12/21/20 70   Wt Readings from Last 3 Encounters:  01/19/21 206 lb 3.2 oz (93.5 kg)  12/27/20 203 lb 12.8 oz (92.4 kg)  12/21/20 207 lb 12.8 oz (94.3 kg)    Assessment: Review of patient past medical history, allergies, medications, health status, including review of consultants reports, laboratory and other test data, was performed as part of comprehensive evaluation and provision of chronic care management services.   SDOH:  (Social Determinants of Health) assessments and interventions performed: Yes   CCM Care Plan  Allergies  Allergen Reactions   Sulfa Antibiotics Rash    Medications Reviewed Today     Reviewed by [name], [facility] (Pharmacist) on 02/15/21 at 1009  Med List Status: <None>   Medication Order Taking? Sig Documenting Provider Last Dose Status Informant  antiseptic oral rinse (BIOTENE) LIQD [id number] Yes 15 mLs by Mouth Rinse route in the morning and at bedtime. [provider] Taking Active Self  Budeson-Glycopyrrol-Formoterol (BREZTRI AEROSPHERE) 160-9-4.8 MCG/ACT AERO [id number] Yes Inhale 2 puffs into the lungs in the morning and at bedtime. Collene Gobble, MD Taking Active   carvedilol (COREG) 3.125 MG tablet [id number] Yes Take 1 tablet (3.125 mg total) by mouth 2 (two) times daily with a meal. [name], MD Taking Active   cholecalciferol (VITAMIN D) 1000 units tablet [id number] Yes Take 1,000 Units by mouth every evening. [provider] Taking Active Self  ENTRESTO 24-26 [place] [id number] Yes TAKE 1 TABLET BY MOUTH TWICE DAILY Crenshaw, Denice Bors, MD Taking Active   eplerenone  (INSPRA) 25 MG tablet [id number] Yes Take 1 tablet (25 mg total) by mouth daily. [name], MD Taking Active   furosemide (LASIX) 40 MG tablet [id number] Yes TAKE 2 TABLETS BY MOUTH EVERY DAY Stanford Breed Denice Bors, MD Taking Active   ipratropium (ATROVENT) 0.06 % nasal spray [id number] Yes Place 2 sprays into both nostrils 3 (three) times daily. Collene Gobble, MD Taking Active   levalbuterol [facility] HFA) 45 MCG/ACT inhaler [id number] Yes Inhale 2 puffs into the lungs every 4 (four) hours as needed for wheezing. Collene Gobble, MD Taking Active   loratadine (CLARITIN) 10 MG tablet [id number] Yes Take 10 mg by mouth in the morning. [provider] Taking Active Self  Magnesium 250 MG TABS [id number] Yes Take 250 mg by mouth every evening. [provider] Taking Active Self  mexiletine (MEXITIL) 150 MG capsule [id number] Yes TAKE 2 CAPSULES(300 MG) BY MOUTH TWICE  DAILY [name], MD Taking Active   Multiple Vitamin (MULTIVITAMIN WITH MINERALS) TABS [id number] Yes Take 1 tablet by mouth every evening. [provider] Taking Active Self  nitroGLYCERIN (NITROSTAT) 0.4 MG SL tablet [id number] Yes DISSOLVE 1 TABLET UNDER THE TONGUE EVERY 5 MINUTES AS NEEDED FOR CHEST PAIN  Patient taking differently: Place 0.4 mg under the tongue every 5 (five) minutes x 3 doses as needed for chest pain.   [name], MD Taking Active            Med Note Ramon Dredge, JESSICA K   Mon Feb 14, 2020 11:45 AM) Hasnt used in 10 years  Pirfenidone (ESBRIET) 267 MG TABS [id number] Yes Take 2 tablets (534 mg total) by mouth with breakfast, with lunch, and with evening meal. Brand Males, MD Taking Active   polyvinyl alcohol-povidone (HYPOTEARS) 1.4-0.6 % ophthalmic solution [id number] Yes Place 1-2 drops into both eyes in the morning and at bedtime. [provider] Taking Active Self  potassium chloride SA (KLOR-CON) 20 MEQ tablet [id number] Yes Take 1 tablet (20 mEq total) by mouth daily  with lunch. [name], MD Taking Active   prednisoLONE acetate (PRED FORTE) 1 % ophthalmic suspension [id number] Yes Place 1 drop into the left eye in the morning. [provider] Taking Active   rosuvastatin (CRESTOR) 40 MG tablet [id number] Yes Take 1 tablet (40 mg total) by mouth every evening. [name], MD Taking Active   XARELTO 20 MG TABS tablet [id number] Yes TAKE 1 TABLET(20 MG) BY MOUTH DAILY WITH SUPPER [name], MD Taking Active             Patient Active Problem List   Diagnosis Date Noted   IPF (idiopathic pulmonary fibrosis) ([place]) 11/16/2020   Encounter for follow-up surveillance of colon cancer    Benign neoplasm of descending colon    Pulmonary nodules 05/18/2015   Rectal bleeding 03/29/2015   Internal hemorrhoid, bleeding 03/29/2015   COPD (chronic obstructive pulmonary disease) ([place]) 03/28/2015   Gastroesophageal reflux disease 03/17/2015   Prostate cancer screening 03/09/2014   Thoracic aortic aneurysm 09/28/2013   Encounter for therapeutic drug monitoring 03/03/2013   Constipation, chronic 03/00/9233   Umbilical hernia with intermittent pain 10/21/2012   Obesity (BMI 30-39.9) 10/21/2012   TIA (transient ischemic attack) 09/25/2012   Other malaise and fatigue 08/25/2012   Vitamin D deficiency 07/28/2012   CHF (congestive heart failure) ([place]) 07/28/2012   Chronotropic incompetence 10/25/2011   History of non anemic vitamin B12 deficiency 05/29/2011   Mild cognitive impairment 01/13/2011   Ringing in ears 01/13/2011   CAD (coronary artery disease) 09/14/2010   Elevated transaminase level--on amiodarone 06/19/2010   Biventricular implantable cardioverter-defibrillator in situ 05/16/2010   Long term (current) use of anticoagulants 04/11/2010   Microscopic hematuria 04/04/2010   CLAUDICATION 11/28/2009   Atrial fibrillation ([place]) 06/29/2009   Chronic combined systolic and diastolic heart failure ([place]) 05/26/2009   Ischemic  cardiomyopathy 07/07/2008   VENTRICULAR TACHYCARDIA 06/29/2008   Hyperlipidemia 01/04/2008   Essential hypertension 01/04/2008   Chronic rhinitis 03/04/2007   Obstructive sleep apnea 03/02/2007    Immunization History  Administered Date(s) Administered   Fluad Quad(high Dose 65+) 09/23/2018, 10/05/2020   Influenza Split 11/11/2010, 11/02/2012, 10/14/2013   Influenza Whole 09/27/2011, 11/13/2011   Influenza, High Dose Seasonal PF 09/26/2015, 10/07/2016   Influenza, Seasonal, Injecte, Preservative Fre 09/23/2018   Influenza,inj,Quad PF,6+ Mos 11/03/2016, 09/20/2017   Influenza-Unspecified 09/26/2014, 10/15/2019   PFIZER Comirnaty(Gray Top)Covid-19 Tri-Sucrose Vaccine  04/24/2020   PFIZER(Purple Top)SARS-COV-2 Vaccination 02/02/2019, 02/22/2019, 10/26/2019, 09/29/2020   Pneumococcal Conjugate-13 06/15/2014   Pneumococcal Polysaccharide-23 03/15/2002, 07/10/2015   Td 08/27/2017   Tdap 04/04/2010   Zoster Recombinat (Shingrix) 03/29/2016   Zoster, Live 05/15/2015   Conditions to be addressed/monitored: Atrial Fibrillation, CHF, HTN, HLD, and Hypertriglyceridemia  Patient Care Plan: General Pharmacy (Adult)     Problem Identified: Atrial Fibrillation, CHF, HTN, HLD, and Hypertriglyceridemia   Priority: High  Onset Date: 02/15/2021     Long-Range Goal: Patient-Specific Goal   Start Date: 02/15/2021  Expected End Date: 08/15/2021  This Visit's Progress: On track  Priority: High  Note:   Current Barriers:  High cost medications  Pharmacist Clinical Goal(s):  Patient will verbalize ability to afford treatment regimen achieve improvement in fatigue as evidenced by symptoms through collaboration with PharmD and provider.   Interventions: 1:1 collaboration with [name], MD regarding development and update of comprehensive plan of care as evidenced by provider attestation and co-signature Inter-disciplinary care team collaboration (see longitudinal plan of  care) Comprehensive medication review performed; medication list updated in electronic medical record  Hypertension, CHF (BP goal <130/80) -Controlled -Current treatment: Coreg 6.25 mg BID Entresto 24-26 mg BID Furosemide 40 mg 2 tabs daily  -Current home readings: none provided -Current dietary habits: low salt -Current exercise habits: no formal exercise. Limited due to chf copd ild  -Denies hypotensive/hypertensive symptoms -Educated on BP goals and benefits of medications for prevention of heart attack, stroke and kidney damage; -Counseled to monitor BP at home 3-4x/wk, document, and provide log at future appointments -Recommended to continue current medication Assessed patient finances. Helped patient apply for healthwell grant due to high copay with entresto - approved.  CPA to mail grant information to patient and contact pharmacy w/ grant information.   Hyperlipidemia: (LDL goal < 70) -Not ideally controlled -Current treatment: Rosuvastatin 40 mg once daily Appropriate, Effective, Safe, Accessible -Medications previously tried: previously noted rash with crestor, tolerating well. No side effects noted  -Educated on Cholesterol goals;  -Recommended to continue current medication  Update 02/15/21 Most recent LDL well controlled. Denies any more issues with Crestor. Adherence 100% - continue current meds. Recheck lipids at next routine check up.  Atrial Fibrillation (Goal: prevent stroke and major bleeding) -Not ideally controlled -CHADSVASC: 7 (>10% annual stroke risk) -Current treatment: Rate control: Coreg Anticoagulation: Xarelto -Medications previously tried: amiodarone -Home BP and HR readings: none provided  -Counseled on increased risk of stroke due to Afib and benefits of anticoagulation for stroke prevention; importance of adherence to anticoagulant exactly as prescribed; -Assessed patient finances. Based on estimated finances, unlikely to qualify for Xarelto  PAP  COPD,  (Goal: control symptoms and prevent exacerbations) -Not ideally controlled New dgx of ILD, set to start Esbriet through pulm. Pharmacist there helped patient get approved for PAP.  -Current treatment  Xopenex prn  Appropriate, Effective, Safe, Accessible Breztri 160-9-4.72mg Appropriate, Effective, Safe, Accessible Esbriet 2616mtwo tablet three times per day  Appropriate, Effective, Safe, Accessible -Medications previously tried: symbicort, albuterol   -Gold Grade: Gold 2 (FEV1 50-79%) -Current COPD Classification:  B (high sx, <2 exacerbations/yr) -Pulmonary function testing: 09/2020  -Exacerbations requiring treatment in last 6 months: 0 -Patient reports consistent use of maintenance inhaler -Frequency of rescue inhaler use: ~2x/wk -Counseled on Proper inhaler technique; Benefits of consistent maintenance inhaler use When to use rescue inhaler Differences between maintenance and rescue inhalers -Recommended to continue current medication Assessed patient finances. Unlikely to qualify for assistance  on Breztri  Update 02/14/21 Still using inhalers appropriately. Was taken off Esbriet temporarily due to LFTs and fatigue.  He has started it back taking one tablet three times daily with plans to work back to twice daily. Breztri and other meds currently affordable. Continue to monitor LFTs. Followed by pulm - continue routine check ups wit them on lung function.   Follow Up Plan: The care management team will reach out to the patient again over the next 180 days.          Medication Assistance: None required.  Patient affirms current coverage meets needs.  Patient's preferred pharmacy is:  [id number] - [place], [place] - [address] [place]  [address] Phone: [phone] Fax: [phone]  [facility] - [place], [place] [address] [place]  [address] Phone: [phone] Fax: [phone]   Follow Up:  Patient agrees to Care Plan and Follow-up.  Plan:  2 month BP call, 4 month CPP f/u call   Future Appointments  Date Time Provider [place]  02/16/2021  7:00 AM [facility] DEVICE REMOTES [facility]  02/28/2021 10:30 AM MC-PULMONARY REHAB UNDERGRAD MC-REHSC None  03/06/2021  1:15 PM MC-PULMONARY REHAB UNDERGRAD MC-REHSC None  03/08/2021  1:15 PM MC-PULMONARY REHAB UNDERGRAD MC-REHSC None  03/09/2021  3:15 PM Brand Males, MD [facility]-PULCARE None  03/13/2021  1:15 PM MC-PULMONARY REHAB UNDERGRAD MC-REHSC None  03/15/2021  1:15 PM MC-PULMONARY REHAB UNDERGRAD MC-REHSC None  03/20/2021  1:15 PM MC-PULMONARY REHAB UNDERGRAD MC-REHSC None  03/22/2021  1:15 PM MC-PULMONARY REHAB UNDERGRAD MC-REHSC None  03/27/2021  1:15 PM MC-PULMONARY REHAB UNDERGRAD MC-REHSC None  03/29/2021  1:15 PM MC-PULMONARY REHAB UNDERGRAD MC-REHSC None  04/03/2021  1:15 PM MC-PULMONARY REHAB UNDERGRAD MC-REHSC None  04/05/2021  1:15 PM MC-PULMONARY REHAB UNDERGRAD MC-REHSC None  04/10/2021  1:15 PM MC-PULMONARY REHAB UNDERGRAD MC-REHSC None  04/12/2021  1:15 PM MC-PULMONARY REHAB UNDERGRAD MC-REHSC None  04/17/2021  1:15 PM MC-PULMONARY REHAB UNDERGRAD MC-REHSC None  04/19/2021  1:15 PM MC-PULMONARY REHAB UNDERGRAD MC-REHSC None  04/24/2021  1:15 PM MC-PULMONARY REHAB UNDERGRAD MC-REHSC None  04/26/2021  1:15 PM MC-PULMONARY REHAB UNDERGRAD MC-REHSC None  05/01/2021  1:15 PM MC-PULMONARY REHAB UNDERGRAD MC-REHSC None  05/03/2021  1:15 PM MC-PULMONARY REHAB UNDERGRAD MC-REHSC None  05/17/2021  2:00 PM [name], MD [facility]  05/18/2021  7:00 AM CVD-[facility] DEVICE REMOTES [facility]  06/20/2021 10:40 AM Stanford Breed, Denice Bors, MD CVD-HIGHPT None  08/17/2021  7:00 AM CVD-[facility] DEVICE REMOTES [facility]   Beverly Milch, PharmD Clinical Pharmacist  [facility] [phone]

## 2021-02-15 ENCOUNTER — Telehealth: Payer: Self-pay | Admitting: Cardiology

## 2021-02-15 DIAGNOSIS — I255 Ischemic cardiomyopathy: Secondary | ICD-10-CM

## 2021-02-15 NOTE — Telephone Encounter (Signed)
Pt c/o medication issue:  1. Name of Medication: farxiga  2. How are you currently taking this medication (dosage and times per day)? Not currently taking  3. Are you having a reaction (difficulty breathing--STAT)? no  4. What is your medication issue? Patient calling to speak with Hilda Blades. He states it is okay to go ahead and call in [place] if they still want to. He says his pharamcy is [facility] in [place]. He states he does also want a call back from [place].

## 2021-02-15 NOTE — Telephone Encounter (Signed)
Addressed 12/16 phone note - no drug interactions noted, ok to start either [place] or [place].

## 2021-02-15 NOTE — Patient Instructions (Addendum)
Visit Information   Goals Addressed             This Visit's Progress    Increase physical activity   On track      Patient Care Plan: General Pharmacy (Adult)     Problem Identified: Atrial Fibrillation, CHF, HTN, HLD, and Hypertriglyceridemia   Priority: High  Onset Date: 02/15/2021     Long-Range Goal: Patient-Specific Goal   Start Date: 02/15/2021  Expected End Date: 08/15/2021  This Visit's Progress: On track  Priority: High  Note:   Current Barriers:  High cost medications  Pharmacist Clinical Goal(s):  Patient will verbalize ability to afford treatment regimen achieve improvement in fatigue as evidenced by symptoms through collaboration with PharmD and provider.   Interventions: 1:1 collaboration with [name], MD regarding development and update of comprehensive plan of care as evidenced by provider attestation and co-signature Inter-disciplinary care team collaboration (see longitudinal plan of care) Comprehensive medication review performed; medication list updated in electronic medical record  Hypertension, CHF (BP goal <130/80) -Controlled -Current treatment: Coreg 6.25 mg BID Entresto 24-26 mg BID Furosemide 40 mg 2 tabs daily  -Current home readings: none provided -Current dietary habits: low salt -Current exercise habits: no formal exercise. Limited due to chf copd ild  -Denies hypotensive/hypertensive symptoms -Educated on BP goals and benefits of medications for prevention of heart attack, stroke and kidney damage; -Counseled to monitor BP at home 3-4x/wk, document, and provide log at future appointments -Recommended to continue current medication Assessed patient finances. Helped patient apply for healthwell grant due to high copay with entresto - approved.  CPA to mail grant information to patient and contact pharmacy w/ grant information.   Hyperlipidemia: (LDL goal < 70) -Not ideally controlled -Current treatment: Rosuvastatin 40 mg  once daily Appropriate, Effective, Safe, Accessible -Medications previously tried: previously noted rash with crestor, tolerating well. No side effects noted  -Educated on Cholesterol goals;  -Recommended to continue current medication  Update 02/15/21 Most recent LDL well controlled. Denies any more issues with Crestor. Adherence 100% - continue current meds. Recheck lipids at next routine check up.  Atrial Fibrillation (Goal: prevent stroke and major bleeding) -Not ideally controlled -CHADSVASC: 7 (>10% annual stroke risk) -Current treatment: Rate control: Coreg Anticoagulation: Xarelto -Medications previously tried: amiodarone -Home BP and HR readings: none provided  -Counseled on increased risk of stroke due to Afib and benefits of anticoagulation for stroke prevention; importance of adherence to anticoagulant exactly as prescribed; -Assessed patient finances. Based on estimated finances, unlikely to qualify for Xarelto PAP  COPD,  (Goal: control symptoms and prevent exacerbations) -Not ideally controlled New dgx of ILD, set to start Esbriet through pulm. Pharmacist there helped patient get approved for PAP.  -Current treatment  Xopenex prn  Appropriate, Effective, Safe, Accessible Breztri 160-9-4.52mg Appropriate, Effective, Safe, Accessible Esbriet 2645mtwo tablet three times per day  Appropriate, Effective, Safe, Accessible -Medications previously tried: symbicort, albuterol   -Gold Grade: Gold 2 (FEV1 50-79%) -Current COPD Classification:  B (high sx, <2 exacerbations/yr) -Pulmonary function testing: 09/2020  -Exacerbations requiring treatment in last 6 months: 0 -Patient reports consistent use of maintenance inhaler -Frequency of rescue inhaler use: ~2x/wk -Counseled on Proper inhaler technique; Benefits of consistent maintenance inhaler use When to use rescue inhaler Differences between maintenance and rescue inhalers -Recommended to continue current  medication Assessed patient finances. Unlikely to qualify for assistance on Breztri  Update 02/14/21 Still using inhalers appropriately. Was taken off Esbriet temporarily due to LFTs and  fatigue.  He has started it back taking one tablet three times daily with plans to work back to twice daily. Breztri and other meds currently affordable. Continue to monitor LFTs. Followed by pulm - continue routine check ups wit them on lung function.   Follow Up Plan: The care management team will reach out to the patient again over the next 180 days.        Patient verbalizes understanding of instructions and care plan provided today and agrees to view in [place]. Active MyChart status confirmed with patient.   Telephone follow up appointment with pharmacy team member scheduled for: 6 months  [name], [place], PharmD Clinical Pharmacist  [facility] [phone]

## 2021-02-15 NOTE — Telephone Encounter (Signed)
Spoke with pt, he is ready to start the [place]. Patient made aware that jardiance is preferred by his insurance. Will make sure no interactions with medications he is on and then we can get the prescription sent in. Pt agreed with this plan.

## 2021-02-16 ENCOUNTER — Ambulatory Visit (INDEPENDENT_AMBULATORY_CARE_PROVIDER_SITE_OTHER): Payer: Medicare Other

## 2021-02-16 ENCOUNTER — Telehealth: Payer: Self-pay | Admitting: Internal Medicine

## 2021-02-16 DIAGNOSIS — I255 Ischemic cardiomyopathy: Secondary | ICD-10-CM

## 2021-02-16 LAB — CUP PACEART REMOTE DEVICE CHECK
Battery Remaining Longevity: 96 mo
Battery Remaining Percentage: 100 %
Brady Statistic RA Percent Paced: 0 %
Brady Statistic RV Percent Paced: 97 %
Date Time Interrogation Session: 20230203012100
HighPow Impedance: 42 Ohm
Implantable Lead Implant Date: 20020527
Implantable Lead Implant Date: 20020527
Implantable Lead Implant Date: 20131212
Implantable Lead Location: 753858
Implantable Lead Location: 753859
Implantable Lead Location: 753860
Implantable Lead Model: 148
Implantable Lead Model: 4194
Implantable Lead Model: 6940
Implantable Lead Serial Number: 120244
Implantable Pulse Generator Implant Date: 20220131
Lead Channel Impedance Value: 502 Ohm
Lead Channel Impedance Value: 671 Ohm
Lead Channel Impedance Value: 787 Ohm
Lead Channel Setting Pacing Amplitude: 2 V
Lead Channel Setting Pacing Amplitude: 2.5 V
Lead Channel Setting Pacing Amplitude: 2.5 V
Lead Channel Setting Pacing Pulse Width: 0.4 ms
Lead Channel Setting Pacing Pulse Width: 1 ms
Lead Channel Setting Sensing Sensitivity: 0.6 mV
Lead Channel Setting Sensing Sensitivity: 1 mV
Pulse Gen Serial Number: 149562

## 2021-02-16 MED ORDER — EMPAGLIFLOZIN 10 MG PO TABS: 10.0000 mg | ORAL_TABLET | Freq: Every day | ORAL | 3 refills | Status: DC

## 2021-02-16 NOTE — Telephone Encounter (Signed)
I do not have much experience with Jardiance.  This is because I do not prescribe it.  Having said that have seen some IPF patients on this medication and not have a problem.

## 2021-02-16 NOTE — Telephone Encounter (Signed)
Called and left detailed message per [facility] for patient. Told her since Dr Chase Caller doesn't have much experience for this medication he can not give much feed back but the IPF patient he does have on this medication he has had no issues and that if he had any questions to call office back.  Nothing further needed

## 2021-02-16 NOTE — Telephone Encounter (Signed)
Spoke with pt, aware script for jardiance sent to the pharmacy. Lab orders mailed to the pt

## 2021-02-16 NOTE — Telephone Encounter (Signed)
Spoke to patient.  Patient stated that cardiology wants him to start jardiance 10 mg. He would like Dr. Golden Pop opinion on this.  Dr. Chase Caller, please advise. thanks

## 2021-02-21 NOTE — Progress Notes (Signed)
Remote ICD transmission.

## 2021-02-22 ENCOUNTER — Telehealth (HOSPITAL_COMMUNITY): Payer: Self-pay

## 2021-02-22 ENCOUNTER — Telehealth (HOSPITAL_COMMUNITY): Payer: Self-pay | Admitting: *Deleted

## 2021-02-22 NOTE — Telephone Encounter (Signed)
Called [name] to confirm his [place] orientation appointment, he states that he will be coming. We discussed Covid precautions, proper shoes, wearing mask, directions to the department and our contact number. He voices understanding.

## 2021-02-22 NOTE — Telephone Encounter (Signed)
Called Pinchus to see if he could come at 11am for his orientation. [name] agreed.

## 2021-02-23 ENCOUNTER — Telehealth: Payer: Self-pay | Admitting: Internal Medicine

## 2021-02-23 DIAGNOSIS — I255 Ischemic cardiomyopathy: Secondary | ICD-10-CM

## 2021-02-23 DIAGNOSIS — J84112 Idiopathic pulmonary fibrosis: Secondary | ICD-10-CM

## 2021-02-23 DIAGNOSIS — Z5181 Encounter for therapeutic drug level monitoring: Secondary | ICD-10-CM

## 2021-02-23 NOTE — Telephone Encounter (Signed)
Will give forms to MR when he returns to the office 2/13.

## 2021-02-23 NOTE — Telephone Encounter (Signed)
Forwarding to [place] for f/u

## 2021-02-28 ENCOUNTER — Encounter (HOSPITAL_COMMUNITY): Payer: Self-pay

## 2021-02-28 ENCOUNTER — Other Ambulatory Visit: Payer: Self-pay

## 2021-02-28 ENCOUNTER — Encounter (HOSPITAL_COMMUNITY)
Admission: RE | Admit: 2021-02-28 | Discharge: 2021-02-28 | Disposition: A | Discharge status: Home / Self Care | Payer: Medicare Other | Source: Ambulatory Visit | Attending: Internal Medicine | Admitting: Internal Medicine

## 2021-02-28 VITALS — BP 108/68 | HR 70 | Ht 70.0 in | Wt 199.3 lb

## 2021-02-28 DIAGNOSIS — J84112 Idiopathic pulmonary fibrosis: Secondary | ICD-10-CM | POA: Insufficient documentation

## 2021-02-28 DIAGNOSIS — J439 Emphysema, unspecified: Secondary | ICD-10-CM | POA: Insufficient documentation

## 2021-02-28 NOTE — Progress Notes (Signed)
Pulmonary Individual Treatment Plan  Patient Details  Name: DANELL VERNO MRN: [id number] Date of Birth: [birth year] Referring Provider:   April Manson Pulmonary Rehab Walk Test from 02/28/2021 in [place]  Referring Provider Ramaswamy       Initial Encounter Date:  Flowsheet Row Pulmonary Rehab Walk Test from 02/28/2021 in [place]  Date 02/28/21       Visit Diagnosis: Idiopathic pulmonary fibrosis ([place])  Patient's Home Medications on Admission:   Current Outpatient Medications:    antiseptic oral rinse (BIOTENE) LIQD, 15 mLs by Mouth Rinse route in the morning and at bedtime., Disp: , Rfl:    Budeson-Glycopyrrol-Formoterol (BREZTRI AEROSPHERE) 160-9-4.8 MCG/ACT AERO, Inhale 2 puffs into the lungs in the morning and at bedtime., Disp: 5.9 g, Rfl: 5   carvedilol (COREG) 3.125 MG tablet, Take 1 tablet (3.125 mg total) by mouth 2 (two) times daily with a meal., Disp: 180 tablet, Rfl: 3   cholecalciferol (VITAMIN D) 1000 units tablet, Take 1,000 Units by mouth every evening., Disp: , Rfl:    empagliflozin (JARDIANCE) 10 MG TABS tablet, Take 1 tablet (10 mg total) by mouth daily before breakfast., Disp: 90 tablet, Rfl: 3   ENTRESTO 24-26 MG, TAKE 1 TABLET BY MOUTH TWICE DAILY, Disp: 180 tablet, Rfl: 3   eplerenone (INSPRA) 25 MG tablet, Take 1 tablet (25 mg total) by mouth daily., Disp: 30 tablet, Rfl: 11   furosemide (LASIX) 40 MG tablet, TAKE 2 TABLETS BY MOUTH EVERY DAY, Disp: 90 tablet, Rfl: 3   ipratropium (ATROVENT) 0.06 % nasal spray, Place 2 sprays into both nostrils 3 (three) times daily., Disp: 15 mL, Rfl: 1   levalbuterol (XOPENEX HFA) 45 MCG/ACT inhaler, Inhale 2 puffs into the lungs every 4 (four) hours as needed for wheezing., Disp: 1 each, Rfl: 12   loratadine (CLARITIN) 10 MG tablet, Take 10 mg by mouth in the morning., Disp: , Rfl:    Magnesium 250 MG TABS, Take 250 mg by mouth every evening., Disp: , Rfl:     mexiletine (MEXITIL) 150 MG capsule, TAKE 2 CAPSULES(300 MG) BY MOUTH TWICE DAILY, Disp: 360 capsule, Rfl: 2   Multiple Vitamin (MULTIVITAMIN WITH MINERALS) TABS, Take 1 tablet by mouth every evening., Disp: , Rfl:    nitroGLYCERIN (NITROSTAT) 0.4 MG SL tablet, DISSOLVE 1 TABLET UNDER THE TONGUE EVERY 5 MINUTES AS NEEDED FOR CHEST PAIN (Patient taking differently: Place 0.4 mg under the tongue every 5 (five) minutes x 3 doses as needed for chest pain.), Disp: 25 tablet, Rfl: 5   Pirfenidone (ESBRIET) 267 MG TABS, Take 2 tablets (534 mg total) by mouth with breakfast, with lunch, and with evening meal., Disp: 180 tablet, Rfl: 5   polyvinyl alcohol-povidone (HYPOTEARS) 1.4-0.6 % ophthalmic solution, Place 1-2 drops into both eyes in the morning and at bedtime., Disp: , Rfl:    potassium chloride SA (KLOR-CON) 20 MEQ tablet, Take 1 tablet (20 mEq total) by mouth daily with lunch., Disp: 90 tablet, Rfl: 2   prednisoLONE acetate (PRED FORTE) 1 % ophthalmic suspension, Place 1 drop into the left eye in the morning., Disp: , Rfl:    rosuvastatin (CRESTOR) 40 MG tablet, Take 1 tablet (40 mg total) by mouth every evening., Disp: 90 tablet, Rfl: 2   XARELTO 20 MG TABS tablet, TAKE 1 TABLET(20 MG) BY MOUTH DAILY WITH SUPPER, Disp: 90 tablet, Rfl: 1  Past Medical History: Past Medical History:  Diagnosis Date   Adenomatous  colon polyp 2009; 06/2012   Colonoscopy 07/2007---Dr. Barron Schmid GI--Repeat 06/2012 showed one tubular adenoma w/out high grade dysplasia.   AICD (automatic cardioverter/defibrillator) present    Chemical engineer   Atrial fibrillation ([place])    Bilateral sensorineural hearing loss    BPH (benign prostatic hypertrophy)    PSAs ok (followed by urologist): no LUTS   CAD (coronary artery disease)    PTCA RCA 1997 (Inf/post MI);   CHF (congestive heart failure) ([place])    Chronic constipation    Chronic rhinitis    ?vasomotor (Dr. Erik Obey)   COPD (chronic obstructive pulmonary disease)  ([place])    Diverticulosis of colon 2005   Noted on Colonoscopies in 2005 and 2009 and on CT 2010   Gallstones 06/2014   No evidence of cholecystitis on abd u/s 06/2014   GERD (gastroesophageal reflux disease)    Hepatic steatosis 2010; 2016   Noted on CT abd 2010 and on u/s abd 04/2009 (transaminasemia) and u/s 06/2014.   Hyperlipidemia    Hypertension    Ischemic cardiomyopathy    Microscopic hematuria    Cysto neg 02/2009 except for BPH-likely has microhem from his renal stone dz   Nephrolithiasis    No distal stones on CT 2010; stable 8 mm RLP stone, 60m LMP stone 10/2013 plain film.  Abd u/s 06/2014: 9 mm nonobstructing lower pole right renal stone.  03/2015 urol: stable nonobstructing renal calc by CT 12/2014 and KUB 03/2015.   Obstructive sleep apnea    no cpap uses since weight loss of 50 pounds   Presence of permanent cardiac pacemaker    [employer]   Squamous cell carcinoma of back    Ventricular tachycardia     Tobacco Use: Social History   Tobacco Use  Smoking Status Former   Packs/day: 2.00   Years: 15.00   Pack years: 30.00   Types: Cigarettes   Quit date: 01/15/1980   Years since quitting: 41.1  Smokeless Tobacco Never    Labs: Recent Review Flowsheet Data     Labs for ITP Cardiac and Pulmonary Rehab Latest Ref Rng & Units 08/09/2019 11/17/2019 05/17/2020 11/16/2020 11/17/2020   Cholestrol 0 - 200 mg/dL 130 127 135 124 -   LDLCALC 0 - 99 mg/dL 62 70 81 70 -   LDLDIRECT mg/dL - - - - -   HDL >39.00 mg/dL 30.30(L) 36.30(L) 32.50(L) 34.80(L) -   Trlycerides 0.0 - 149.0 mg/dL 191.0(H) 102.0 105.0 98.0 -   Hemoglobin A1c 4.6 - 6.5 % - - - - 6.0   PHART 7.350 - 7.450 - - - - -   PCO2ART 35.0 - 45.0 mmHg - - - - -   HCO3 20.0 - 24.0 mEq/L - - - - -   TCO2 0 - 100 mmol/L - - - - -   ACIDBASEDEF 0.0 - 2.0 mmol/L - - - - -   O2SAT % - - - - -       Capillary Blood Glucose: Lab Results  Component Value Date   GLUCAP 101 (H) 09/26/2012   GLUCAP 107 (H) 09/26/2012    GLUCAP 100 (H) 09/25/2012   GLUCAP 108 (H) 12/10/2009     Pulmonary Assessment Scores:  Pulmonary Assessment Scores     Row Name 02/28/21 1153         ADL UCSD   ADL Phase Entry     SOB Score total 42       CAT Score   CAT Score 29  mMRC Score   mMRC Score 2             UCSD: Self-administered rating of dyspnea associated with activities of daily living (ADLs) 6-point scale (0 = "not at all" to 5 = "maximal or unable to do because of breathlessness")  Scoring Scores range from 0 to 120.  Minimally important difference is 5 units  CAT: CAT can identify the health impairment of COPD patients and is better correlated with disease progression.  CAT has a scoring range of zero to 40. The CAT score is classified into four groups of low (less than 10), medium (10 - 20), high (21-30) and very high (31-40) based on the impact level of disease on health status. A CAT score over 10 suggests significant symptoms.  A worsening CAT score could be explained by an exacerbation, poor medication adherence, poor inhaler technique, or progression of COPD or comorbid conditions.  CAT MCID is 2 points  mMRC: mMRC (Modified Medical Research Council) Dyspnea Scale is used to assess the degree of baseline functional disability in patients of respiratory disease due to dyspnea. No minimal important difference is established. A decrease in score of 1 point or greater is considered a positive change.   Pulmonary Function Assessment:  Pulmonary Function Assessment - 02/28/21 1156       Breath   Bilateral Breath Sounds Rales    Shortness of Breath Yes;Limiting activity             Exercise Target Goals: Exercise Program Goal: Individual exercise prescription set using results from initial 6 min walk test and THRR while considering  patients activity barriers and safety.   Exercise Prescription Goal: Initial exercise prescription builds to 30-45 minutes a day of aerobic  activity, 2-3 days per week.  Home exercise guidelines will be given to patient during program as part of exercise prescription that the participant will acknowledge.  Activity Barriers & Risk Stratification:  Activity Barriers & Cardiac Risk Stratification - 02/28/21 1138       Activity Barriers & Cardiac Risk Stratification   Activity Barriers History of Falls;Deconditioning;Muscular Weakness;Shortness of Breath   History of falls due to BP med that was d/c   Cardiac Risk Stratification Moderate             6 Minute Walk:  6 Minute Walk     Row Name 02/28/21 1256         6 Minute Walk   Phase Initial     Distance 1220 feet     Walk Time 6 minutes     # of Rest Breaks 0     MPH 2.31     METS 2.74     RPE 9     Perceived Dyspnea  0     VO2 Peak 9.6     Symptoms No     Resting HR 70 bpm     Resting BP 108/68     Resting Oxygen Saturation  96 %     Exercise Oxygen Saturation  during 6 min walk 94 %     Max Ex. HR 143 bpm     Max Ex. BP 138/68     2 Minute Post BP 110/60       Interval HR   1 Minute HR 116     2 Minute HR 114     3 Minute HR 116     4 Minute HR 129     5 Minute HR 143  6 Minute HR 140     2 Minute Post HR 70     Interval Heart Rate? Yes       Interval Oxygen   Interval Oxygen? Yes     Baseline Oxygen Saturation % 96 %     1 Minute Oxygen Saturation % 96 %     1 Minute Liters of Oxygen 0 L     2 Minute Oxygen Saturation % 95 %     2 Minute Liters of Oxygen 0 L     3 Minute Oxygen Saturation % 94 %     3 Minute Liters of Oxygen 0 L     4 Minute Oxygen Saturation % 94 %     4 Minute Liters of Oxygen 0 L     5 Minute Oxygen Saturation % 95 %     5 Minute Liters of Oxygen 0 L     6 Minute Oxygen Saturation % 95 %     6 Minute Liters of Oxygen 0 L     2 Minute Post Oxygen Saturation % 97 %     2 Minute Post Liters of Oxygen 0 L              Oxygen Initial Assessment:  Oxygen Initial Assessment - 02/28/21 1135       Home Oxygen    Home Oxygen Device None    Sleep Oxygen Prescription None    Home Exercise Oxygen Prescription None    Home Resting Oxygen Prescription None      Initial 6 min Walk   Oxygen Used None      Program Oxygen Prescription   Program Oxygen Prescription None      Intervention   Short Term Goals To learn and exhibit compliance with exercise, home and travel O2 prescription;To learn and understand importance of monitoring SPO2 with pulse oximeter and demonstrate accurate use of the pulse oximeter.;To learn and understand importance of maintaining oxygen saturations>88%;To learn and demonstrate proper pursed lip breathing techniques or other breathing techniques. ;To learn and demonstrate proper use of respiratory medications    Long  Term Goals Exhibits compliance with exercise, home  and travel O2 prescription;Verbalizes importance of monitoring SPO2 with pulse oximeter and return demonstration;Maintenance of O2 saturations>88%;Exhibits proper breathing techniques, such as pursed lip breathing or other method taught during program session;Compliance with respiratory medication;Demonstrates proper use of MDIs             Oxygen Re-Evaluation:   Oxygen Discharge (Final Oxygen Re-Evaluation):   Initial Exercise Prescription:  Initial Exercise Prescription - 02/28/21 1200       Date of Initial Exercise RX and Referring Provider   Date 02/28/21    Referring Provider Ramaswamy    Expected Discharge Date 05/03/21      Recumbant Bike   Level 1    Watts 21    Minutes 15      NuStep   Level 1    SPM 70    Minutes 15    METs 2.74      Prescription Details   Frequency (times per week) 2    Duration Progress to 30 minutes of continuous aerobic without signs/symptoms of physical distress      Intensity   THRR 40-80% of Max Heartrate 56-113    Ratings of Perceived Exertion 11-13    Perceived Dyspnea 0-4      Progression   Progression Continue to progress workloads to maintain  intensity without signs/symptoms of physical  distress.      Resistance Training   Training Prescription Yes    Weight blue bands    Reps 10-15             Perform Capillary Blood Glucose checks as needed.  Exercise Prescription Changes:   Exercise Comments:   Exercise Goals and Review:   Exercise Goals     Row Name 02/28/21 1139             Exercise Goals   Increase Physical Activity Yes       Intervention Provide advice, education, support and counseling about physical activity/exercise needs.;Develop an individualized exercise prescription for aerobic and resistive training based on initial evaluation findings, risk stratification, comorbidities and participant's personal goals.       Expected Outcomes Short Term: Attend rehab on a regular basis to increase amount of physical activity.;Long Term: Add in home exercise to make exercise part of routine and to increase amount of physical activity.;Long Term: Exercising regularly at least 3-5 days a week.       Increase Strength and Stamina Yes       Intervention Provide advice, education, support and counseling about physical activity/exercise needs.;Develop an individualized exercise prescription for aerobic and resistive training based on initial evaluation findings, risk stratification, comorbidities and participant's personal goals.       Expected Outcomes Short Term: Increase workloads from initial exercise prescription for resistance, speed, and METs.;Short Term: Perform resistance training exercises routinely during rehab and add in resistance training at home;Long Term: Improve cardiorespiratory fitness, muscular endurance and strength as measured by increased METs and functional capacity (6MWT)       Able to understand and use rate of perceived exertion (RPE) scale Yes       Intervention Provide education and explanation on how to use RPE scale       Expected Outcomes Short Term: Able to use RPE daily in rehab to  express subjective intensity level;Long Term:  Able to use RPE to guide intensity level when exercising independently       Able to understand and use Dyspnea scale Yes       Intervention Provide education and explanation on how to use Dyspnea scale       Expected Outcomes Short Term: Able to use Dyspnea scale daily in rehab to express subjective sense of shortness of breath during exertion;Long Term: Able to use Dyspnea scale to guide intensity level when exercising independently       Knowledge and understanding of Target Heart Rate Range (THRR) Yes       Intervention Provide education and explanation of THRR including how the numbers were predicted and where they are located for reference       Expected Outcomes Short Term: Able to state/look up THRR;Long Term: Able to use THRR to govern intensity when exercising independently;Short Term: Able to use daily as guideline for intensity in rehab       Understanding of Exercise Prescription Yes       Intervention Provide education, explanation, and written materials on patient's individual exercise prescription       Expected Outcomes Short Term: Able to explain program exercise prescription;Long Term: Able to explain home exercise prescription to exercise independently                Exercise Goals Re-Evaluation :   Discharge Exercise Prescription (Final Exercise Prescription Changes):   Nutrition:  Target Goals: Understanding of nutrition guidelines, daily intake of sodium <1556m, cholesterol <2042m  calories 30% from fat and 7% or less from saturated fats, daily to have 5 or more servings of fruits and vegetables.  Biometrics:  Pre Biometrics - 02/28/21 1103       Pre Biometrics   Grip Strength 21 kg              Nutrition Therapy Plan and Nutrition Goals:   Nutrition Assessments:  MEDIFICTS Score Key: ?70 Need to make dietary changes  40-70 Heart Healthy Diet ? 40 Therapeutic Level Cholesterol Diet   Picture Your  Plate Scores: <80 Unhealthy dietary pattern with much room for improvement. 41-50 Dietary pattern unlikely to meet recommendations for good health and room for improvement. 51-60 More healthful dietary pattern, with some room for improvement.  >60 Healthy dietary pattern, although there may be some specific behaviors that could be improved.    Nutrition Goals Re-Evaluation:   Nutrition Goals Discharge (Final Nutrition Goals Re-Evaluation):   Psychosocial: Target Goals: Acknowledge presence or absence of significant depression and/or stress, maximize coping skills, provide positive support system. Participant is able to verbalize types and ability to use techniques and skills needed for reducing stress and depression.  Initial Review & Psychosocial Screening:  Initial Psych Review & Screening - 02/28/21 1126       Initial Review   Current issues with None Identified      Family Dynamics   Good Support System? Yes    Comments wife, 3 sons      Barriers   Psychosocial barriers to participate in program There are no identifiable barriers or psychosocial needs.      Screening Interventions   Interventions Encouraged to exercise             Quality of Life Scores:  Scores of 19 and below usually indicate a poorer quality of life in these areas.  A difference of  2-3 points is a clinically meaningful difference.  A difference of 2-3 points in the total score of the Quality of Life Index has been associated with significant improvement in overall quality of life, self-image, physical symptoms, and general health in studies assessing change in quality of life.  PHQ-9: Recent Review Flowsheet Data     Depression screen [facility] 2/9 02/28/2021 11/16/2020 11/06/2020 05/17/2020 11/17/2019   Decreased Interest 0 2 0 0 0   Down, Depressed, Hopeless 0 1 0 0 0   PHQ - 2 Score 0 3 0 0 0   Altered sleeping 1  1 - 0 0   Tired, decreased energy 1  2 - 0 0   Change in appetite 0 0 - 0 0    Feeling bad or failure about yourself  0 0 - 0 0   Trouble concentrating 0 0 - 0 0   Moving slowly or fidgety/restless 0 0 - 0 0   Suicidal thoughts 0 0 - 0 0   PHQ-9 Score 2 6 - 0 0   Difficult doing work/chores Very difficult - - Not difficult at all Not difficult at all      Interpretation of Total Score  Total Score Depression Severity:  1-4 = Minimal depression, 5-9 = Mild depression, 10-14 = Moderate depression, 15-19 = Moderately severe depression, 20-27 = Severe depression   Psychosocial Evaluation and Intervention:  Psychosocial Evaluation - 02/28/21 1127       Psychosocial Evaluation & Interventions   Interventions Encouraged to exercise with the program and follow exercise prescription    Expected Outcomes For Mikiah to participate in  Pulmonary Rehab    Continue Psychosocial Services  Follow up required by staff             Psychosocial Re-Evaluation:   Psychosocial Discharge (Final Psychosocial Re-Evaluation):   Education: Education Goals: Education classes will be provided on a weekly basis, covering required topics. Participant will state understanding/return demonstration of topics presented.  Learning Barriers/Preferences:  Learning Barriers/Preferences - 02/28/21 1131       Learning Barriers/Preferences   Learning Barriers None    Learning Preferences Written Material;Pictoral;Group Instruction;Video;Skilled Demonstration;Computer/Internet;Audio;Individual Instruction             Education Topics: Risk Factor Reduction:  -Group instruction that is supported by a PowerPoint presentation. Instructor discusses the definition of a risk factor, different risk factors for pulmonary disease, and how the heart and lungs work together.     Nutrition for Pulmonary Patient:  -Group instruction provided by PowerPoint slides, verbal discussion, and written materials to support subject matter. The instructor gives an explanation and review of healthy diet  recommendations, which includes a discussion on weight management, recommendations for fruit and vegetable consumption, as well as protein, fluid, caffeine, fiber, sodium, sugar, and alcohol. Tips for eating when patients are short of breath are discussed.   Pursed Lip Breathing:  -Group instruction that is supported by demonstration and informational handouts. Instructor discusses the benefits of pursed lip and diaphragmatic breathing and detailed demonstration on how to preform both.     Oxygen Safety:  -Group instruction provided by PowerPoint, verbal discussion, and written material to support subject matter. There is an overview of What is Oxygen and Why do we need it.  Instructor also reviews how to create a safe environment for oxygen use, the importance of using oxygen as prescribed, and the risks of noncompliance. There is a brief discussion on traveling with oxygen and resources the patient may utilize.   Oxygen Equipment:  -Group instruction provided by [facility] Staff utilizing handouts, written materials, and equipment demonstrations.   Signs and Symptoms:  -Group instruction provided by written material and verbal discussion to support subject matter. Warning signs and symptoms of infection, stroke, and heart attack are reviewed and when to call the physician/911 reinforced. Tips for preventing the spread of infection discussed.   Advanced Directives:  -Group instruction provided by verbal instruction and written material to support subject matter. Instructor reviews Advanced Directive laws and proper instruction for filling out document.   Pulmonary Video:  -Group video education that reviews the importance of medication and oxygen compliance, exercise, good nutrition, pulmonary hygiene, and pursed lip and diaphragmatic breathing for the pulmonary patient.   Exercise for the Pulmonary Patient:  -Group instruction that is supported by a PowerPoint presentation.  Instructor discusses benefits of exercise, core components of exercise, frequency, duration, and intensity of an exercise routine, importance of utilizing pulse oximetry during exercise, safety while exercising, and options of places to exercise outside of rehab.     Pulmonary Medications:  -Verbally interactive group education provided by instructor with focus on inhaled medications and proper administration.   Anatomy and Physiology of the Respiratory System and Intimacy:  -Group instruction provided by PowerPoint, verbal discussion, and written material to support subject matter. Instructor reviews respiratory cycle and anatomical components of the respiratory system and their functions. Instructor also reviews differences in obstructive and restrictive respiratory diseases with examples of each. Intimacy, Sex, and Sexuality differences are reviewed with a discussion on how relationships can change when diagnosed with pulmonary disease. Common sexual  concerns are reviewed.   MD DAY -A group question and answer session with a medical doctor that allows participants to ask questions that relate to their pulmonary disease state.   OTHER EDUCATION -Group or individual verbal, written, or video instructions that support the educational goals of the pulmonary rehab program.   Holiday Eating Survival Tips:  -Group instruction provided by PowerPoint slides, verbal discussion, and written materials to support subject matter. The instructor gives patients tips, tricks, and techniques to help them not only survive but enjoy the holidays despite the onslaught of food that accompanies the holidays.   Knowledge Questionnaire Score:  Knowledge Questionnaire Score - 02/28/21 1152       Knowledge Questionnaire Score   Pre Score 14/18             Core Components/Risk Factors/Patient Goals at Admission:  Personal Goals and Risk Factors at Admission - 02/28/21 1127       Core Components/Risk  Factors/Patient Goals on Admission    Weight Management Weight Loss    Improve shortness of breath with ADL's Yes    Intervention Provide education, individualized exercise plan and daily activity instruction to help decrease symptoms of SOB with activities of daily living.    Expected Outcomes Short Term: Improve cardiorespiratory fitness to achieve a reduction of symptoms when performing ADLs;Long Term: Be able to perform more ADLs without symptoms or delay the onset of symptoms    Increase knowledge of respiratory medications and ability to use respiratory devices properly  Yes    Intervention Provide education and demonstration as needed of appropriate use of medications, inhalers, and oxygen therapy.    Expected Outcomes Short Term: Achieves understanding of medications use. Understands that oxygen is a medication prescribed by physician. Demonstrates appropriate use of inhaler and oxygen therapy.;Long Term: Maintain appropriate use of medications, inhalers, and oxygen therapy.             Core Components/Risk Factors/Patient Goals Review:    Core Components/Risk Factors/Patient Goals at Discharge (Final Review):    ITP Comments:  Dr. Rodman Pickle is Medical Director for Pulmonary Rehab at [facility].

## 2021-02-28 NOTE — Progress Notes (Signed)
[name] 81 y.o. male Pulmonary Rehab Orientation Note This patient who was referred to Pulmonary Rehab by Dr. Chase Caller with the diagnosis of Idiopathic Pulmonary Fibrosis arrived today in Cardiac and Pulmonary Rehab. He arrived with ambulatory normal gait. He does not carry portable oxygen. Per pt, [name] uses oxygen never. Color good, skin warm and dry. Patient is oriented to time and place. Patient's medical history, psychosocial health, and medications reviewed. Psychosocial assessment reveals pt lives with spouse. [name] is currently retired. [name] used to work in [employer] for about 10 years. Pt hobbies include yardwork, fishing, and hunting. Pt reports his stress level is moderate. Areas of stress/anxiety include health. Pt does not exhibit signs of depression. PHQ2/9 score 0/2. [name] shows good  coping skills with negative outlook on life. [name] feels he has a good support system that includes his wife and sons. Offered emotional support and reassurance. Will continue to monitor. Physical assessment performed by Maurice Small, RN. Please see their orientation physical assessment note. [name] reports he  does take medications as prescribed. Patient states he  follows a regular  diet. The patient has been trying to lose weight through a healthy diet and exercise program.. Pt's weight will be monitored closely. Demonstration and practice of PLB using pulse oximeter. [name] able to return demonstration satisfactorily. Safety and hand hygiene in the exercise area reviewed with patient. [name] voices understanding of the information reviewed. Department expectations discussed with patient and achievable goals were set. The patient shows enthusiasm about attending the program and we look forward to working with [name]. [name] completed a 6 min walk test today and is scheduled to begin exercise on 03/06/21 at 1:15pm.   [phone] Sheppard Plumber, MS, ACSM-CEP

## 2021-03-01 NOTE — Telephone Encounter (Signed)
Letter and info has been placed with MR's papers for him to review. Routing this encounter to MR.

## 2021-03-01 NOTE — Telephone Encounter (Signed)
Called and spoke with pt letting him know that the orders were placed for bloodwork and he verbalized understanding. Nothing further needed.

## 2021-03-01 NOTE — Telephone Encounter (Signed)
Ordre to do LFT with regular blood work done

## 2021-03-01 NOTE — Telephone Encounter (Signed)
Attempted to call pt but unable to reach. Left message for him to return call.  What need to know is: does pt want to come to our office to get the labs done that Dr. Stanford Breed needs him to do as well as having his liver function checked or does he want to get all this done at Dr. Jacalyn Lefevre office.  If pt does want to get the hepatic function panel and bmet done at our office, then we can order labs

## 2021-03-02 ENCOUNTER — Other Ambulatory Visit (INDEPENDENT_AMBULATORY_CARE_PROVIDER_SITE_OTHER): Payer: Medicare Other

## 2021-03-02 ENCOUNTER — Telehealth: Payer: Self-pay | Admitting: Internal Medicine

## 2021-03-02 DIAGNOSIS — I255 Ischemic cardiomyopathy: Secondary | ICD-10-CM

## 2021-03-02 DIAGNOSIS — J84112 Idiopathic pulmonary fibrosis: Secondary | ICD-10-CM

## 2021-03-02 DIAGNOSIS — Z5181 Encounter for therapeutic drug level monitoring: Secondary | ICD-10-CM | POA: Diagnosis not present

## 2021-03-02 LAB — HEPATIC FUNCTION PANEL
ALT: 24 U/L (ref 0–53)
AST: 25 U/L (ref 0–37)
Albumin: 4.3 g/dL (ref 3.5–5.2)
Alkaline Phosphatase: 49 U/L (ref 39–117)
Bilirubin, Direct: 0.2 mg/dL (ref 0.0–0.3)
Total Bilirubin: 0.7 mg/dL (ref 0.2–1.2)
Total Protein: 7 g/dL (ref 6.0–8.3)

## 2021-03-02 LAB — BASIC METABOLIC PANEL
BUN: 16 mg/dL (ref 6–23)
CO2: 34 mEq/L — ABNORMAL HIGH (ref 19–32)
Calcium: 9.1 mg/dL (ref 8.4–10.5)
Chloride: 102 mEq/L (ref 96–112)
Creatinine, Ser: 0.99 mg/dL (ref 0.40–1.50)
GFR: 72.49 mL/min (ref >60.00)
Glucose, Bld: 145 mg/dL — ABNORMAL HIGH (ref 70–99)
Potassium: 3.9 mEq/L (ref 3.5–5.1)
Sodium: 139 mEq/L (ref 135–145)

## 2021-03-02 NOTE — Telephone Encounter (Signed)
Deja from [facility] needs referral and records of ov notes, labs, images, and PFT faxed before patient's upcoming appointment on 03/14/2021 with Dr. Wynn Maudlin.  Please advise.

## 2021-03-05 NOTE — Telephone Encounter (Signed)
Called [facility] but was advised it was a long wait time. Left a message for [name] to call back. If they are needing records, they will need to contact medical records at [phone]. Patient was not referred by our office, they will need to contact PCP for the referral.

## 2021-03-06 ENCOUNTER — Other Ambulatory Visit: Payer: Self-pay

## 2021-03-06 ENCOUNTER — Encounter (HOSPITAL_COMMUNITY)
Admission: RE | Admit: 2021-03-06 | Discharge: 2021-03-06 | Disposition: A | Discharge status: Home / Self Care | Payer: Medicare Other | Source: Ambulatory Visit | Attending: Internal Medicine | Admitting: Internal Medicine

## 2021-03-06 VITALS — Wt 197.1 lb

## 2021-03-06 DIAGNOSIS — J439 Emphysema, unspecified: Secondary | ICD-10-CM | POA: Diagnosis not present

## 2021-03-06 DIAGNOSIS — J84112 Idiopathic pulmonary fibrosis: Secondary | ICD-10-CM | POA: Diagnosis not present

## 2021-03-06 NOTE — Progress Notes (Signed)
Daily Session Note  Patient Details  Name: [name] MRN: [id number] Date of Birth: Dec 20, 1940 Referring Provider:   April Manson Pulmonary Rehab Walk Test from 02/28/2021 in [place]  Referring Provider Ramaswamy       Encounter Date: 03/06/2021  Check In:  Session Check In - 03/06/21 1459       Check-In   Supervising physician immediately available to respond to emergencies [facility] immediately available    Physician(s) Dr. Doristine Bosworth    Location [facility]    Staff Present Rosebud Poles, RN, Quentin Ore, MS, ACSM-CEP, Exercise Physiologist;Lisa Ysidro Evert, RN    Virtual Visit No    Medication changes reported     No    Fall or balance concerns reported    No    Tobacco Cessation No Change    Warm-up and Cool-down Performed as group-led instruction    Resistance Training Performed Yes    VAD Patient? No    PAD/SET Patient? No      Pain Assessment   Currently in Pain? No/denies    Multiple Pain Sites No             Capillary Blood Glucose: No results found. However, due to the size of the patient record, not all encounters were searched. Please check Results Review for a complete set of results.   Exercise Prescription Changes - 03/06/21 1500       Response to Exercise   Blood Pressure (Admit) 122/64    Blood Pressure (Exercise) 94/60    Blood Pressure (Exit) 100/58    Heart Rate (Admit) 70 bpm    Heart Rate (Exercise) 89 bpm    Heart Rate (Exit) 70 bpm    Oxygen Saturation (Admit) 97 %    Oxygen Saturation (Exercise) 94 %    Oxygen Saturation (Exit) 97 %    Rating of Perceived Exertion (Exercise) 11    Perceived Dyspnea (Exercise) 1    Duration Continue with 30 min of aerobic exercise without signs/symptoms of physical distress.    Intensity THRR unchanged      Progression   Progression Continue to progress workloads to maintain intensity without signs/symptoms of physical distress.       Resistance Training   Training Prescription Yes    Weight blue bands    Reps 10-15    Time 10 Minutes      NuStep   Level 2    SPM 80    Minutes 15    METs 1.6      Track   Laps 14    Minutes 15    METs 2.63             Social History   Tobacco Use  Smoking Status Former   Packs/day: 2.00   Years: 15.00   Pack years: 30.00   Types: Cigarettes   Quit date: 01/15/1980   Years since quitting: 41.1  Smokeless Tobacco Never    Goals Met:  Proper associated with RPD/PD & O2 Sat Exercise tolerated well No report of concerns or symptoms today Strength training completed today  Goals Unmet:  Not Applicable  Comments: Service time is from 1319 to 1440.    Dr. Rodman Pickle is Medical Director for Pulmonary Rehab at [facility].

## 2021-03-07 NOTE — Progress Notes (Signed)
Late entry:                                                                                                         Pulmonary Rehab Orientation Physical Assessment Note  Physical assessment reveals  Pt is alert and oriented x 3.  Heart rate is normal, breath sounds typical bilateral crackles particularly in the bases as expected with pulmonary fibrosis. Reports non-productive cough. Bowel sounds present.  Pt denies abdominal discomfort, nausea, vomiting or diarrhea. Grip strength equal, strong. Distal pulses palpable; trace swelling to lower extremities. Cherre Huger, BSN Cardiac and Training and development officer

## 2021-03-08 ENCOUNTER — Encounter (HOSPITAL_COMMUNITY)
Admission: RE | Admit: 2021-03-08 | Discharge: 2021-03-08 | Disposition: A | Discharge status: Home / Self Care | Payer: Medicare Other | Source: Ambulatory Visit | Attending: Internal Medicine | Admitting: Internal Medicine

## 2021-03-08 ENCOUNTER — Other Ambulatory Visit: Payer: Self-pay

## 2021-03-08 DIAGNOSIS — J84112 Idiopathic pulmonary fibrosis: Secondary | ICD-10-CM

## 2021-03-08 DIAGNOSIS — J439 Emphysema, unspecified: Secondary | ICD-10-CM | POA: Diagnosis not present

## 2021-03-08 NOTE — Progress Notes (Signed)
Daily Session Note  Patient Details  Name: [name] MRN: [id number] Date of Birth: 1940-07-05 Referring Provider:   April Manson Pulmonary Rehab Walk Test from 02/28/2021 in [place]  Referring Provider Ramaswamy       Encounter Date: 03/08/2021  Check In:  Session Check In - 03/08/21 1433       Check-In   Supervising physician immediately available to respond to emergencies [facility] immediately available    Physician(s) Dr. Eliseo Squires    Location [facility]    Staff Present Elmon Else, MS, ACSM-CEP, Exercise Physiologist;David Lilyan Punt, MS, ACSM-CEP, CCRP, Exercise Physiologist;Tora Prunty Ysidro Evert, RN    Virtual Visit No    Medication changes reported     No    Fall or balance concerns reported    No    Tobacco Cessation No Change    Warm-up and Cool-down Performed as group-led instruction    Resistance Training Performed Yes    VAD Patient? No    PAD/SET Patient? No      Pain Assessment   Currently in Pain? No/denies    Multiple Pain Sites No             Capillary Blood Glucose: No results found. However, due to the size of the patient record, not all encounters were searched. Please check Results Review for a complete set of results.    Social History   Tobacco Use  Smoking Status Former   Packs/day: 2.00   Years: 15.00   Pack years: 30.00   Types: Cigarettes   Quit date: 01/15/1980   Years since quitting: 41.1  Smokeless Tobacco Never    Goals Met:  Exercise tolerated well No report of concerns or symptoms today Strength training completed today  Goals Unmet:  Not Applicable  Comments: Service time is from 1320 to 1440    Dr. Rodman Pickle is Medical Director for Pulmonary Rehab at [facility].

## 2021-03-09 ENCOUNTER — Encounter: Payer: Self-pay | Admitting: Internal Medicine

## 2021-03-09 ENCOUNTER — Ambulatory Visit (INDEPENDENT_AMBULATORY_CARE_PROVIDER_SITE_OTHER): Payer: Medicare Other | Admitting: Internal Medicine

## 2021-03-09 VITALS — BP 128/68 | HR 70 | Temp 98.2°F | Ht 70.0 in | Wt 199.6 lb

## 2021-03-09 DIAGNOSIS — I255 Ischemic cardiomyopathy: Secondary | ICD-10-CM | POA: Diagnosis not present

## 2021-03-09 DIAGNOSIS — J84112 Idiopathic pulmonary fibrosis: Secondary | ICD-10-CM

## 2021-03-09 DIAGNOSIS — K219 Gastro-esophageal reflux disease without esophagitis: Secondary | ICD-10-CM

## 2021-03-09 DIAGNOSIS — J439 Emphysema, unspecified: Secondary | ICD-10-CM | POA: Diagnosis not present

## 2021-03-09 DIAGNOSIS — Z5181 Encounter for therapeutic drug level monitoring: Secondary | ICD-10-CM | POA: Diagnosis not present

## 2021-03-09 DIAGNOSIS — R634 Abnormal weight loss: Secondary | ICD-10-CM

## 2021-03-09 DIAGNOSIS — J8410 Pulmonary fibrosis, unspecified: Secondary | ICD-10-CM

## 2021-03-09 NOTE — Progress Notes (Signed)
ROV 02/19/18 --81 year old man who follows up today for mixed obstruction and restriction, COPD as well as stable bilateral lower lobe interstitial disease.  We questioned possible amiodarone toxicity and this was stopped.  He had a repeat high-resolution CT scan of the chest on 02/17/2018 that I reviewed.  This shows some mild emphysematous change irregular peripheral interstitial disease with some mild associated bronchiectasis, no significant honeycomb change.  Unchanged compared with his prior 1 year ago.  Pulmonary function testing done on 02/09/2018 shows mild mixed restriction and obstruction, overall stable compared with his prior 1 year ago.        ROV 04/23/19 --[name] is 65 and has a history of mixed obstruction and restriction on pulmonary function testing, COPD as well as stable bilateral lower lobe interstitial disease.  His last CT scan was 02/17/2018 (stable).  He was previously on amiodarone, this has been stopped for several years.  Currently managed on Symbicort, is using albuterol very rarely He reports that he is having some imbalance, feels unstable w ambulation, has had some falls.  He is having more cough, mucous production over the last several months. He has a lot of nasal gtt - uses Atrovent NS with improvement. He is on flonase daily, loratadine.   Chest x-ray from 04/15/2019 reviewed by me, shows persistent interstitial markings particularly in the right midlung. No significant change or increase noted when comparing with prior films.   ROV 09/27/20 --follow-up visit 81 year old gentleman whom we have followed for mixed obstruction and restriction due to COPD and also bilateral lower lobe interstitial disease question amiodarone toxicity.  Autoimmune panel in October 2017 was negative.  He is now in atrial fibrillation almost all the time, notices that his dyspnea is associated with tachycardia.  Currently managed on Symbicort, albuterol which he is using 2-3x a month.  He  is having a lot of cough and nasal congestion / drainage. He wonders if the flonase is part of the problem, was told to do atrovent up to tid to see if he gets benefit.   Most recent CT chest performed 09/22/2020 reviewed by me, shows persistent patchy groundglass attenuation and septal thickening in the subpleural regions without any definite honeycomb, possibly slightly progressed compared with 02/17/2018.  Pulmonary function testing performed today and reviewed by me, showed mixed restriction and obstruction with an FEV1 of 1.9 865% predicted and no bronchodilator response.  Significant decrease in FEV1 compared with 02/09/2018.  Restricted lung volumes, decreased diffusion capacity that does correct when adjusted for his alveolar volume.  OV 10/05/2020 -transfer of care to the ILD center with Dr. Chase Caller.  Patient being referred by Dr. Baltazar Apo  Subjective:  Patient ID: [name], male , DOB: 1940/05/23 , age 86 y.o. , MRN: [id number] , ADDRESS: [place] [address] PCP [name], MD Patient Care Team: [name], MD as PCP - General (Family Medicine) Festus Aloe, MD as Consulting Physician (Urology) Michael Boston, MD as Consulting Physician (General Surgery) Stanford Breed, Denice Bors, MD as Consulting Physician (Cardiology) Jodi Marble, MD as Consulting Physician (Otolaryngology) Deboraha Sprang, MD as Consulting Physician (Cardiology) Luberta Mutter, MD as Consulting Physician (Ophthalmology) Lamonte Sakai Rose Fillers, MD as Consulting Physician (Pulmonary Disease) Mauri Pole, MD as Consulting Physician (Gastroenterology) [name], [facility] as Pharmacist (Pharmacist)  This Provider for this visit: Treatment Team:  Attending Provider: Brand Males, MD    10/05/2020 -   Chief Complaint  Patient presents with   Follow-up    Pt states that he does have complaints of SOB that can happen at any time but is worse with exertion.      HPI [name] 81 y.o. -referred by Dr. Baltazar Apo.  History is provided by the patient and also review of the medical records.  Patient tells me he is aware that he has ILD.  He has a significant symptom burden.  He says he is recently been told his ILD is gotten worse.  He was on amiodarone for many years.  Then approximately 18 months ago he stopped it because he got worried about the drug.  Dr. Lamonte Sakai notes indicate that patient has combined emphysema and interstitial lung disease.  His pulmonary function test profile shows 20% decline in FVC in the last 5 years or so with most of it coming in the last 2 years.  High-resolution CT chest only shows some decline.  High-resolution CT chest personally visualized by me and is probable UIP.  His serologies are negative.  He is a former smoker having quit over 40 years ago.  He is Caucasian and his age is greater than 78.  Male gender.  He definitely has progressive symptoms.  He really wants a good symptom control.  He also wants understand his disease.  Today most of the focus was spent on helping him understand interstitial lung disease.  I explicitly told him that I would not be able to address symptom burden today.  He will also switch his comprehensive pulmonary care to see Dr. Chase Caller myself.  He has had an MI in the past.  His atrial fibrillation is on Eliquis.  He is no longer on amiodarone.  His walking desaturation test did not show desaturation more than 3 points.   CT Chest data Sept 2022  Narrative & Impression  CLINICAL DATA:  81 year old male with history of COPD. Evaluate for interstitial lung disease.   EXAM: CT CHEST WITHOUT CONTRAST   TECHNIQUE: Multidetector CT imaging of the chest was performed following the standard protocol without intravenous contrast. High resolution imaging of the lungs, as well as inspiratory and expiratory imaging, was performed.   COMPARISON:  Chest CT 02/17/2018.    FINDINGS: Cardiovascular: Heart size is normal. There is no significant pericardial fluid, thickening or pericardial calcification. There is aortic atherosclerosis, as well as atherosclerosis of the great vessels of the mediastinum and the coronary arteries, including calcified atherosclerotic plaque in the left main, left anterior descending, left circumflex and right coronary arteries. Calcifications of the aortic valve. Curvilinear calcifications associated with the myocardium of the inferior wall of the left ventricle in the mid ventricle and base, likely sequela of prior left circumflex coronary artery territory myocardial infarction(s). Left-sided biventricular pacemaker/AICD with lead tips terminating in the right atrial appendage, right ventricular apex, and overlying the lateral wall the left ventricle via the coronary sinus and coronary veins.   Mediastinum/Nodes: No pathologically enlarged mediastinal or hilar lymph nodes. Please note that accurate exclusion of hilar adenopathy is limited on noncontrast CT scans. Esophagus is unremarkable in appearance. No axillary lymphadenopathy.   Lungs/Pleura: High-resolution images again demonstrate some patchy areas of ground-glass attenuation, septal thickening, subpleural reticulation, mild traction bronchiectasis and peripheral bronchiolectasis in the lungs bilaterally. No definite honeycombing. These findings have a mild craniocaudal gradient. Inspiratory and expiratory imaging is unremarkable. No acute consolidative airspace disease. No pleural effusions. Chronic elevation of the right hemidiaphragm is again noted. No definite suspicious appearing  pulmonary nodules or masses are noted.   Upper Abdomen: Low-attenuation lesions are noted in the left lobe of the liver, incompletely characterized on today's non-contrast CT examination, but similar to the prior study and measuring up to 1.3 cm in diameter in segment 2,  statistically likely to represent cysts.   Musculoskeletal: There are no aggressive appearing lytic or blastic lesions noted in the visualized portions of the skeleton.   IMPRESSION: 1. The appearance of the lungs is compatible with interstitial lung disease, categorized as probable usual interstitial pneumonia (UIP) per current ATS guidelines. Findings appear mildly progressive compared to the prior study. 2. Aortic atherosclerosis, in addition to left main and 3 vessel coronary artery disease. Assessment for potential risk factor modification, dietary therapy or pharmacologic therapy may be warranted, if clinically indicated. 3. There are calcifications of the aortic valve. Echocardiographic correlation for evaluation of potential valvular dysfunction may be warranted if clinically indicated.   Aortic Atherosclerosis (ICD10-I70.0).     Electronically Signed   By: Vinnie Langton M.D.   On: 09/22/2020 11:28   No results found.  Simple office walk 185 feet x  3 laps goal with forehead probe 10/05/2020   O2 used ra  Number laps completed 3  Comments about pace avg  Resting Pulse Ox/HR 100% and 70/min  Final Pulse Ox/HR 97% and 82/min  Desaturated </= 88% no  Desaturated <= 3% points yes  Got Tachycardic >/= 90/min no  Symptoms at end of test Mild dyspnea  Miscellaneous comments x      Results for DAJION, BICKFORD (MRN [id number]) as of 10/05/2020 11:51  Ref. Range 11/14/2015 11:52 09/27/2020 12:19 09/27/2020 13:46  Anti Nuclear Antibody (ANA) Latest Ref Range: NEGATIVE  NEG  NEGATIVE  Anti JO-1 Latest Ref Range: 0.0 - 0.9 AI <4.1    Cyclic Citrullin Peptide Ab Latest Units: UNITS <16  <16  ds DNA Ab Latest Units: IU/mL 1    RA Latex Turbid. Latest Ref Range: <14 IU/mL <14 <14 <14  ENA SM Ab Ser-aCnc Latest Ref Range: <1.0 NEG AI  <1.0 NEG    ANA,IFA RA DIAG PNL W/RFLX TIT/PATN Unknown   Rpt  Ribonucleic Protein(ENA) Antibody, IgG Latest Ref Range: <1.0 NEG AI  <1.0 NEG     SSA (Ro) (ENA) Antibody, IgG Latest Ref Range: <1.0 NEG AI <1.0 NEG <1.0 NEG   SSB (La) (ENA) Antibody, IgG Latest Ref Range: <1.0 NEG AI <1.0 NEG <1.0 NEG   Scleroderma (Scl-70) (ENA) Antibody, IgG Latest Ref Range: <1.0 NEG AI  <1.0 NEG      OV 11/08/2020  Subjective:  Patient ID: [name], male , DOB: [birth year] , age 62 y.o. , MRN: [id number] , ADDRESS: [place] [id number] PCP [name], MD Patient Care Team: [name], MD as PCP - General (Family Medicine) Festus Aloe, MD as Consulting Physician (Urology) Michael Boston, MD as Consulting Physician (General Surgery) Stanford Breed Denice Bors, MD as Consulting Physician (Cardiology) Jodi Marble, MD as Consulting Physician (Otolaryngology) Deboraha Sprang, MD as Consulting Physician (Cardiology) Luberta Mutter, MD as Consulting Physician (Ophthalmology) Lamonte Sakai Rose Fillers, MD as Consulting Physician (Pulmonary Disease) Mauri Pole, MD as Consulting Physician (Gastroenterology) [name], [facility] as Pharmacist (Pharmacist)  This Provider for this visit: Treatment Team:  Attending Provider: Brand Males, MD    11/08/2020 -   Chief Complaint  Patient presents with   Follow-up    Pt states that he has been worried about taking  the Esbriet and states that he has not started taking it yet. States his breathing is about the same.     HPI KAELAN AMBLE 81 y.o. - here with wife to discuss disease, Wife is 22 years old. Her name is Myrnal She was a clinical trials coordinator in the late 1990s at [employer] and was involved in oncology and sildenafil trials. Lot of questions on type of fibrosis (IPF), natural course (few to several  yearS), quality of life expectations, anti fibrotics . Wants symptom relief - referred to rehab. HE is interested in support group - gave him contact number. Risk factor - prior smoking and amio. He has not started esbriet yet. Will do on 11/14/20 . This  is becase he and wife went to  reunion. Ate fried food and got diarrhea. Now resolved.   [facility] Comprehensive ILD Questionnaire    Symptoms:  SYMPTOM SCALE - ILD 11/08/2020  Current weight 209#  O2 use ra  Shortness of Breath 0 -> 5 scale with 5 being worst (score 6 If unable to do)  At rest 0  0Simple tasks - showers, clothes change, eating, shaving 0  Household (dishes, doing bed, laundry) 0  Shopping 3  Walking level at own pace 4  Walking up Stairs 5  Total (30-36) Dyspnea Score 12 x 2 years  same Since onset  How bad is your cough? moderate x  2years. Getting worse. Has light yellow/green sputum. Clears throat  How bad is your fatigue light  How bad is nausea 0  How bad is vomiting?  0  How bad is diarrhea? 0  How bad is anxiety? 0  How bad is depression 0  Any chronic pain - if so where and how bad 0      Past Medical History :  Also copd, cad, chf nos, osa - not using cPAP. Hx of pna > 10 years ago. Hx of renal sones + Has had 5 mRNA covid shots. Last Sept 2022. Never had covid   ROS:  Fatigue + Dry eyes + Gerd +  FAMILY HISTORY of LUNG DISEASE:  denes  PERSONAL EXPOSURE HISTORY:  Smoker [phone]. NOt smoked in 49  years  HOME  EXPOSURE and HOBBY DETAILS :  [place] yers[place]. [place] in [place]. 2 of his tile floors have mildew. Not used jacuzzi in 10 years. 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  Patient has underlying COPD and IPF.  CT chest in September 22, 2020 showed probable UIP.  And mildly progressive compared to previous study.  Patient was recommended to start on Esbriet.  Patient says he has started Esbriet and  seems to be tolerating okay.  He has had a couple episodes where he has some loose stools and he took Imodium.  Unfortunately did have some constipation so he also took MiraLAX which caused him to have upset stomach.  We discussed dietary recommendations and the use of Imodium.  Patient says appetite is good.  No nausea vomiting.  Patient is on Mexiletine , his Esbriet dose will need to remain at 534 mg 3 times a day.  Patient is a been on  a tapering dose and has reached his maximum maintenance dose. Patient has shortness of breath with activities.  Remains active.  Gets winded with heavy lifting or prolonged walking.  Has a daily minimally productive cough.   12/06/2020 Follow up ; IPF, COPD  Patient returns for a 2-week follow-up.  Patient has underlying COPD.  Is on Breztri inhaler.  Patient says overall breathing is doing okay.  Feels that it has slightly improved with decreased cough.  Patient also has underlying IPF.  Recently started on Esbriet.  He is on a lower dose due to drug interactions.  Currently on 534 mg 3 times a day.  Last visit patient was having some increased stomach issues.  We discussed some dietary changes.  Patient is now eating less fried foods and higher protein.  Patient says this is made a huge difference and stomach is much better. Hepatic function panel on November 16, 2020 showed normal LFTs.   TEST/EVENTS :  09/22/2020 High-resolution CT chest compatible with interstitial lung disease probable UIP, mildly progressive compared to previous stud      OV 12/22/2020  Subjective:  Patient ID: [name], male , DOB: 1940/09/03 , age 26 y.o. , MRN: [id number] , ADDRESS: [place] [id number] PCP [name], MD Patient Care Team: [name], MD as PCP - General (Family Medicine) Festus Aloe, MD as Consulting Physician (Urology) Michael Boston, MD as Consulting Physician (General Surgery) Stanford Breed, Denice Bors, MD as  Consulting Physician (Cardiology) Jodi Marble, MD as Consulting Physician (Otolaryngology) Deboraha Sprang, MD as Consulting Physician (Cardiology) Luberta Mutter, MD as Consulting Physician (Ophthalmology) Lamonte Sakai Rose Fillers, MD as Consulting Physician (Pulmonary Disease) Mauri Pole, MD as Consulting Physician (Gastroenterology) [name], [facility] as Pharmacist (Pharmacist)  This Provider for this visit: Treatment Team:  Attending Provider: Brand Males, MD   IPF dx given Sept 2022 -> started esbriet  12/22/2020 -   Chief Complaint  Patient presents with   Follow-up     HPI GIUSEPPE DUCHEMIN 81 y.o. presents with his wife.-since giving him the diagnosis of IPF and recommending pirfenidone over nintedanib [he has heart disease] he see nurse practitioner 2x1 for a face-to-face visit not a video visit.  He is on 2 pills 3 times a day of pirfenidone.  This is because he is on mexiletine [antiarrhythmic that can also cause abdominal pain, headache and nausea] because of this and CYPa12 mechanism that could inhibit pirfenidone metabolism resulting in elevated levels of pirfenidone he has been kept on 2 pills 3 times daily.  He is upset that this was not picked up by the pulmonary pharmacist but only by the cardiology pharmacist.  He reports because of this he is lost a little bit of confidence in [place].  Overall though he says that he has chronic alternating diarrhea with constipation.  He takes Imodium for the diarrhea and then this resolves and constipation.  Then he has to take MiraLAX.  This is at his baseline despite being on Esbriet it is not worse.  He says he is just frustrated by this alternating constipation and diarrhea.  Between diarrhea and constipation he does not favor constipation.  The only side effect he is noticing from [place] is that he is more fatigued.  Otherwise he is overall stable as seen by the symptom score below other than fatigue.  He is also worried because he  is on submaximal doses of pirfenidone that his disease could progress.  I did explain to him that even at 2 pills 3 times daily it is beneficial and with inhibitor effects of mexiletine levels are slightly higher and could potentially reflect similar to being on 3 pills 3 times daily.  Did explain to him that he might not be able to tolerate 3 pills 3 times daily.  Given his cardiac issues nintedanib currently as second line.  We had a shared understanding and agreement on this approach.  No results found.       OV 01/19/2021  Subjective:  Patient ID: [name], male , DOB: 12-04-1940 , age 61 y.o. , MRN: [id number] , ADDRESS: [place] [address] PCP [name], MD Patient Care Team: [name], MD as PCP - General (Family Medicine) Festus Aloe, MD as Consulting Physician (Urology) Michael Boston, MD as Consulting Physician (General Surgery) Stanford Breed Denice Bors, MD as Consulting Physician (Cardiology) Jodi Marble, MD as Consulting Physician (Otolaryngology) Deboraha Sprang, MD as Consulting Physician (Cardiology) Luberta Mutter, MD as Consulting Physician (Ophthalmology) Lamonte Sakai Rose Fillers, MD as Consulting Physician (Pulmonary Disease) Mauri Pole, MD as Consulting Physician (Gastroenterology) [name], [facility] as Pharmacist (Pharmacist)  This Provider for this visit: Treatment Team:  Attending Provider: Brand Males, MD    01/19/2021 -   Chief Complaint  Patient presents with   Follow-up    PFT performed today.  Pt states that he noticed with last bloodwork that his liver enzymes were elevated and also states that he has noticed some yellowing of skin.  IPF dx given Sept 2022 -> started esbriet   HPI LAVANTE TOSO 81 y.o. -returns for follow-up.  This visit is to see his pulmonary function test and his uptake with pirfenidone.  His pulmonary function test shows stability/slight improvement from September 2022 and  similar to his previous baseline.  In terms of pirfenidone intake he continues to have alternating diarrhea and constipation.  In talking to him in more detail he says before his Esbriet he used to take MiraLAX once every other day and have a bowel movement [hard stool] every day once.  After this starting as.  He is now taking his MiraLAX only 1/week.  1 day he has diarrhea in 4 days he is got constipation without any bowel movement.  Therefore he is not taking his MiraLAX for his previous schedule which we discussed at the last visit.  In addition he is lost 1 pound of weight.  He is also reporting intermittent gaseous symptoms bloating acid reflux sour taste.  He takes Tums for this.  He is not taking anything for acid reflux such as PPI or H2 blockade.  He is just taking Tums.  We did discuss the idea of taking ginger capsules but he drinks ginger ale 1 bottle every day.  Did indicate to him even in previous visit that sugary drinks can cause diarrhea and potentially a carbonated drink can contribute to acid reflux.  He is agreed to stop this.  I showed him an example of a ginger capsule that he could buy at [facility] or convenience store.  Of note his Esbriet intake is 2 pills 3 times daily due to drug interaction with mexiletine. For associated emphysema is on inhaler therapy.  Of note: His AST on 01/01/2021 was 44.  Other lab calls is abnormal even though it is below 45.  He was informed of this results.  The plan is to retest today.  He does tell me  that he and his wife noticed that 1 day a few weeks ago that his left finger was yellow and after that it turned out to be normal after a few days.  Due to concerns of potential jaundice and worsening LFTs Arava and the ongoing GI side effects have asked him to stop his Esbriet till further notice or 1 week    OV 03/09/2021  Subjective:  Patient ID: [name], male , DOB: [birth year] , age 53 y.o. , MRN: [id number] , ADDRESS: [place] [address] PCP [name], MD Patient Care Team: [name], MD as PCP - General (Family Medicine) Festus Aloe, MD as Consulting Physician (Urology) Michael Boston, MD as Consulting Physician (General Surgery) Stanford Breed Denice Bors, MD as Consulting Physician (Cardiology) Jodi Marble, MD as Consulting Physician (Otolaryngology) Deboraha Sprang, MD as Consulting Physician (Cardiology) Luberta Mutter, MD as Consulting Physician (Ophthalmology) Lamonte Sakai Rose Fillers, MD as Consulting Physician (Pulmonary Disease) Mauri Pole, MD as Consulting Physician (Gastroenterology) [name], [facility] (Pharmacist)  This Provider for this visit: Treatment Team:  Attending Provider: Brand Males, MD    03/09/2021 -   Chief Complaint  Patient presents with   Follow-up    Pt states he is about the same since last visit. States he recently began pulmonary rehab.   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 -returns for his IPF follow-up.  He has further weight loss.  He is now down to 199 pounds although he is still overweight.  His BMI is over 28.  He is confident that his weight loss is because of medications.  It is otherwise drug-induced weight loss.  He believes the pirfenidone has reduced his appetite.  In addition Jardiance is also causing weight loss.  At this point in time he is happy with his weight loss because he is still overweight.  Overall he is tolerating his pirfenidone well.  He was having alternating diarrhea and constipation but after taking my suggestion and trialing out different regimens he is now settled on taking omeprazole along with ginger and MiraLAX in a scheduled way.  This is now helped prevent the alternating diarrhea and constipation.  He is quite happy with this.   He takes his pirfenidone at 2 pills 3 times daily and 5-6 hours apart.  It does interfere with the social schedule.  Sometimes family wants to have have dinner or lunch at different times.  He is asking for some flexibility.  I encouraged him to try taking it slightly out of schedule and also with different types of snacks and experiment to see whether he has any side effects.  I did indicate to him ultimately the side effects of what will determine his tolerance.  He tells me that the omeprazole is doing quite well to control the GERD.  However the over-the-counter blister pack says it is only for 14 days.  He took clarification on this.  We discussed the long-term effects of omeprazole but overall taking balance versus risk I recommended that he continue his omeprazole for now.  In terms of his symptoms he stable.  He is attending pulmonary rehabilitation.  He says it is helping him.  His wife used to be a clinical trials coordinator.  He is interested in clinical trials.  However at  this point in time he says his son is requested that he take a second opinion at [facility].  He is seeing Dr. Wynn Maudlin on 03/14/2021.  I did indicate to him that we are all part of the pulmonary fibrosis foundation network.  I did indicate to him that Dr. Randol Kern is a well-known expert.  I have also indicated to him that I support the second opinion and I have personally emailed Dr. Randol Kern.  I have asked the patient to take CD-ROM of the CT scan to [facility].  Recent lab work 7 days ago shows normal creatinine and normal liver function test.  Symptoms:  SYMPTOM SCALE - ILD 11/08/2020 12/22/2020 Esbriet since sept/pct 2022 01/19/2021 Esbreit 2 pills tid 03/09/2021   Current weight 209# 207# 206# 199#  O2 use ra ra    Shortness of Breath 0 -> 5 scale with 5 being worst (score 6 If unable to do)     At rest 0 0 0 0  0Simple tasks - showers, clothes change, eating, shaving 0 0 0 0  Household (dishes, doing  bed, laundry) 0 0 0 0  Shopping 3 2 0 4  Walking level at own pace _0 Walking up Stairs _1 Total (30-36) Dyspnea Score 12 x 2 years  same Since onset _2 How bad is your cough? moderate x  2years. Getting worse. Has light yellow/green sputum. Clears throat _3 How bad is your fatigue light _4 How bad is nausea 0 _5 How bad is vomiting?  0 0 0 0  How bad is diarrhea? 0 _6 How bad is anxiety? 0 _7 How bad is depression 0 1 2 0  Any chronic pain - if so where and how bad 0        Simple office walk 185 feet x  3 laps goal with forehead probe 10/05/2020  03/09/2021   O2 used ra ra  Number laps completed 3 3  Comments about pace avg avg  Resting Pulse Ox/HR 100% and 70/min 99% and 70  Final Pulse Ox/HR 97% and 82/min 97% and 82  Desaturated </= 88% no no  Desaturated <= 3% points yes no  Got Tachycardic >/= 90/min no no  Symptoms at end of test Mild dyspnea no  Miscellaneous comments x x      PFT  PFT Results Latest Ref Rng & Units 01/19/2021 09/27/2020 02/09/2018 03/12/2017 02/08/2015  FVC-Pre L 2.67 2.34 3.02 2.82 3.20  FVC-Predicted Pre % 63 55 69 64 71  FVC-Post L - 2.44 2.97 2.78 3.19  FVC-Predicted Post % - 57 67 63 71  Pre FEV1/FVC % % 82 84 82 82 82  Post FEV1/FCV % % - 82 82 83 82  FEV1-Pre L 2.19 1.98 2.48 2.30 2.61  FEV1-Predicted Pre % 72 65 78 72 80  FEV1-Post L - 2.00 2.42 2.30 2.61  DLCO uncorrected ml/min/mmHg 14.09 14.62 18.10 18.46 19.06  DLCO UNC% % 55 57 53 54 56  DLCO corrected ml/min/mmHg 14.09 14.62 17.90 - -  DLCO COR %Predicted % 55 57 53 - -  DLVA Predicted % 93 101 90 95 90  TLC L - 4.17 4.65 4.94 4.59  TLC % Predicted % - 57 64 68 63  RV % Predicted % - 46 56 68 44       has a past  medical history of Adenomatous colon polyp (2009; 06/2012), AICD (automatic cardioverter/defibrillator) present, Atrial fibrillation ([place]), Bilateral sensorineural hearing loss, BPH (benign prostatic hypertrophy), CAD (coronary artery  disease), CHF (congestive heart failure) ([place]), Chronic constipation, Chronic rhinitis, COPD (chronic obstructive pulmonary disease) ([place]), Diverticulosis of colon (2005), Gallstones (06/2014), GERD (gastroesophageal reflux disease), Hepatic steatosis (2010; 2016), Hyperlipidemia, Hypertension, Ischemic cardiomyopathy, Microscopic hematuria, Nephrolithiasis, Obstructive sleep apnea, Presence of permanent cardiac pacemaker, Squamous cell carcinoma of back, and Ventricular tachycardia.   reports that he quit smoking about 41 years ago. His smoking use included cigarettes. He has a 30.00 pack-year smoking history. He has never used smokeless tobacco.  Past Surgical History:  Procedure Laterality Date   ATRIAL ABLATION SURGERY     flutter ablation 2002   BREAST SURGERY Left 1990   "Tissue removed from breast"   CARDIAC CATHETERIZATION     CARDIOVERSION N/A 05/10/2020   Procedure: CARDIOVERSION;  Surgeon: Sanda Klein, MD;  Location: [facility];  Service: Cardiovascular;  Laterality: N/A;   carotid doppler  09/25/12   No signif stenosis   COLONOSCOPY N/A 07/13/2012   Diverticula ascending colon.  Polyp x 1-recall 5 yrs (Dr. Watt Climes) Procedure: COLONOSCOPY;  Surgeon: Jeryl Columbia, MD;  Location: [facility] ENDOSCOPY;  Service: Endoscopy;  Laterality: N/A; polypectomy x 1    COLONOSCOPY WITH PROPOFOL N/A 08/18/2015   Procedure: COLONOSCOPY WITH PROPOFOL;  Surgeon: Mauri Pole, MD;  Location: [facility] ENDOSCOPY;  Service: Endoscopy;  Laterality: N/A;   ESOPHAGOGASTRODUODENOSCOPY N/A 07/13/2012   Procedure: ESOPHAGOGASTRODUODENOSCOPY (EGD);  Surgeon: Jeryl Columbia, MD;  Location: [facility];  Service: Endoscopy;  Laterality: N/A;  NORMAL   EXTRACORPOREAL SHOCK WAVE LITHOTRIPSY     HEMORRHOID BANDING     ICD     a) Guidant Contak H170- Virl Axe, MD 07/25/06 (second device) b) Medtronic CRT-D   ICD GENERATOR CHANGEOUT N/A 02/14/2020   Procedure: ICD GENERATOR CHANGEOUT;  Surgeon: Deboraha Sprang, MD;   Location: [place] [facility];  Service: Cardiovascular;  Laterality: N/A;   INGUINAL HERNIA REPAIR Bilateral 1975   LEAD REVISION Bilateral 12/26/2011   Procedure: LEAD REVISION;  Surgeon: Evans Lance, MD;  Location: [facility] CATH LAB;  Service: Cardiovascular;  Laterality: Bilateral;   PFTs  02/2015   Mod restriction (interstitial), mod diffusion defect (Dr. Annamaria Boots)   precancer area keratsis removed  05/2015    leftf orehead and left arm and right arm and back    Allergies  Allergen Reactions   Sulfa Antibiotics Rash    Immunization History  Administered Date(s) Administered   Fluad Quad(high Dose 65+) 09/23/2018, 10/05/2020   Influenza Split 11/11/2010, 11/02/2012, 10/14/2013   Influenza Whole 09/27/2011, 11/13/2011   Influenza, High Dose Seasonal PF 09/26/2015, 10/07/2016   Influenza, Seasonal, Injecte, Preservative Fre 09/23/2018   Influenza,inj,Quad PF,6+ Mos 11/03/2016, 09/20/2017   Influenza-Unspecified 09/26/2014, 10/15/2019   PFIZER Comirnaty(Gray Top)Covid-19 Tri-Sucrose Vaccine 04/24/2020   PFIZER(Purple Top)SARS-COV-2 Vaccination 02/02/2019, 02/22/2019, 10/26/2019, 09/29/2020   Pneumococcal Conjugate-13 06/15/2014   Pneumococcal Polysaccharide-23 03/15/2002, 07/10/2015   Td 08/27/2017   Tdap 04/04/2010   Zoster Recombinat (Shingrix) 03/29/2016   Zoster, Live 05/15/2015    Family History  Problem Relation Age of Onset   Heart disease Mother    Other Mother        colon surgery for fistula   Gallstones Mother    Breast cancer Maternal Grandmother    Coronary artery disease Father        family hx of   Heart attack Father  4 stents/ pacemaker   Nephrolithiasis Father    Hypertension Sister    Goiter Paternal Grandmother    Nephrolithiasis Son    Colon cancer Neg Hx      Current Outpatient Medications:    antiseptic oral rinse (BIOTENE) LIQD, 15 mLs by Mouth Rinse route in the morning and at bedtime., Disp: , Rfl:    Budeson-Glycopyrrol-Formoterol  (BREZTRI AEROSPHERE) 160-9-4.8 MCG/ACT AERO, Inhale 2 puffs into the lungs in the morning and at bedtime., Disp: 5.9 g, Rfl: 5   carvedilol (COREG) 3.125 MG tablet, Take 1 tablet (3.125 mg total) by mouth 2 (two) times daily with a meal., Disp: 180 tablet, Rfl: 3   cholecalciferol (VITAMIN D) 1000 units tablet, Take 1,000 Units by mouth every evening., Disp: , Rfl:    empagliflozin (JARDIANCE) 10 MG TABS tablet, Take 1 tablet (10 mg total) by mouth daily before breakfast., Disp: 90 tablet, Rfl: 3   ENTRESTO 24-26 MG, TAKE 1 TABLET BY MOUTH TWICE DAILY, Disp: 180 tablet, Rfl: 3   eplerenone (INSPRA) 25 MG tablet, Take 1 tablet (25 mg total) by mouth daily., Disp: 30 tablet, Rfl: 11   furosemide (LASIX) 40 MG tablet, TAKE 2 TABLETS BY MOUTH EVERY DAY, Disp: 90 tablet, Rfl: 3   Ginger, Zingiber officinalis, (GINGER ROOT) 550 MG CAPS, Take by mouth., Disp: , Rfl:    ipratropium (ATROVENT) 0.06 % nasal spray, Place 2 sprays into both nostrils 3 (three) times daily., Disp: 15 mL, Rfl: 1   levalbuterol (XOPENEX HFA) 45 MCG/ACT inhaler, Inhale 2 puffs into the lungs every 4 (four) hours as needed for wheezing., Disp: 1 each, Rfl: 12   loratadine (CLARITIN) 10 MG tablet, Take 10 mg by mouth in the morning., Disp: , Rfl:    Magnesium 250 MG TABS, Take 250 mg by mouth every evening., Disp: , Rfl:    mexiletine (MEXITIL) 150 MG capsule, TAKE 2 CAPSULES(300 MG) BY MOUTH TWICE DAILY, Disp: 360 capsule, Rfl: 2   Multiple Vitamin (MULTIVITAMIN WITH MINERALS) TABS, Take 1 tablet by mouth every evening., Disp: , Rfl:    nitroGLYCERIN (NITROSTAT) 0.4 MG SL tablet, DISSOLVE 1 TABLET UNDER THE TONGUE EVERY 5 MINUTES AS NEEDED FOR CHEST PAIN (Patient taking differently: Place 0.4 mg under the tongue every 5 (five) minutes x 3 doses as needed for chest pain.), Disp: 25 tablet, Rfl: 5   Omeprazole 20 MG TBEC, SMARTSIG:1 By Mouth, Disp: , Rfl:    Pirfenidone (ESBRIET) 267 MG TABS, Take 2 tablets (534 mg total) by mouth with  breakfast, with lunch, and with evening meal., Disp: 180 tablet, Rfl: 5   polyvinyl alcohol-povidone (HYPOTEARS) 1.4-0.6 % ophthalmic solution, Place 1-2 drops into both eyes in the morning and at bedtime., Disp: , Rfl:    potassium chloride SA (KLOR-CON) 20 MEQ tablet, Take 1 tablet (20 mEq total) by mouth daily with lunch., Disp: 90 tablet, Rfl: 2   rosuvastatin (CRESTOR) 40 MG tablet, Take 1 tablet (40 mg total) by mouth every evening., Disp: 90 tablet, Rfl: 2   Simethicone (GAS-X PO), Take by mouth., Disp: , Rfl:    XARELTO 20 MG TABS tablet, TAKE 1 TABLET(20 MG) BY MOUTH DAILY WITH SUPPER, Disp: 90 tablet, Rfl: 1      Objective:   Vitals:   03/09/21 1516  BP: 128/68  Pulse: 70  Temp: 98.2 F (36.8 C)  TempSrc: Oral  SpO2: 99%  Weight: 199 lb 9.6 oz (90.5 kg)  Height: 5' 10" (1.778 m)  Estimated body mass index is 28.64 kg/m as calculated from the following:   Height as of this encounter: 5' 10" (1.778 m).   Weight as of this encounter: 199 lb 9.6 oz (90.5 kg).  _0 @  Filed Weights   03/09/21 1516  Weight: 199 lb 9.6 oz (90.5 kg)     Physical Exam Neuro: Alert and Oriented x 3. GCS 15. Speech normal Psych: Pleasant Resp:  Barrel Chest - no.  Wheeze - no, Crackles - yes velcro, No overt respiratory distress CVS: Normal heart sounds. Murmurs - no Ext: Stigmata of Connective Tissue Disease - no HEENT: Normal upper airway. PEERL +. No post nasal drip        Assessment:       ICD-10-CM   1. IPF (idiopathic pulmonary fibrosis) ([place])  J84.112     2. Medication monitoring encounter  Z51.81     3. Pulmonary emphysema with fibrosis of lung ([place])  J43.9    J84.10     4. Gastroesophageal reflux disease, unspecified whether esophagitis present  K21.9     5. Drug-induced weight loss  R63.4    T50.905A          Plan:     Patient Instructions  Pulmnary Fibrosis with emphysema ILD (interstitial lung disease) ([place]) IPF (idiopathic pulmonary  fibrosis) (H[facility])  - IPF is stable clinically since last visit.  - Clinically feeling better after starting rehab and GI symptoms better controlled -Currently on Esbriet 2 pills 3 times daily   -  being capped because baseline mexilitine can increase esbriet levels --Normal transaminitis on liver function test  feb 2023  Plan - continue esbriet 2 pills x 3 times daily  - ideal to have with meals and 5-6h apart but ok to experimetn some flexibility based on social schedule  - controlling GI symptoms with omeprazole, miralax, ginger regimen - Continue pulmonary rehab  - keep March 14, 2021 2nd opinion appt with Dr Wynn Maudlin at [place] instructions from [place] to take CD-ROM with you to [place] appt  - I will email Dr Randol Kern to let you know you are coming  - PFT test will likely be at [facility] - appreciate clinical trial interst in future -   Chronic obstructive pulmonary disease, unspecified COPD type ([place])  - not in flare up  Plan  - cotninue breztri daily - albueo as needed  GERD   - appears omeprazole helping you  Plan  - continue omeprazole on empty stomach - as it has potential beneficial effect for IPF  Weight loss- drug induced Low appetite   -ongoing weight loss after starting esbriet is due to esbriet and jardiance and related low appetite  - current weight is 199# and BMI 28.64  Plan  -monitor; ok to tolerate weight loss till 170- 175#   Followup - Late April 2023  - 30 min visit; symptoms score and walk test at followup    Follolwup (make all appointments now)  - Video visit with nurse practitioner in 4 weeks  ( Level 05 visit: Estb 40-54 min  visit type: on-site physical face to visit  in total care time and counseling or/and coordination of care by this undersigned MD - Dr Brand Males. This includes one or more of the following on this same day 03/09/2021: pre-charting, chart review, note writing, documentation discussion of test results, diagnostic  or treatment recommendations, prognosis, risks and benefits of management options, instructions, education, compliance or risk-factor reduction. It excludes time  spent by the [place] or office staff in the care of the patient. Actual time 92 min)   SIGNATURE    Dr. Brand Males, M.D., F.C.C.P,  Pulmonary and Critical Care Medicine Staff Physician, [place] Director - Interstitial Lung Disease  Program  [facility] [place] at [place], [place], [address]  Pager: [phone], If no answer or between  15:00h - 7:00h: call [phone] Telephone: [phone]  4:03 PM 03/09/2021

## 2021-03-09 NOTE — Patient Instructions (Addendum)
Pulmnary Fibrosis with emphysema ILD (interstitial lung disease) ([place]) IPF (idiopathic pulmonary fibrosis) (HCC)  - IPF is stable clinically since last visit.  - Clinically feeling better after starting rehab and GI symptoms better controlled -Currently on Esbriet 2 pills 3 times daily   -  being capped because baseline mexilitine can increase esbriet levels --Normal transaminitis on liver function test  feb 2023  Plan - continue esbriet 2 pills x 3 times daily  - ideal to have with meals and 5-6h apart but ok to experimetn some flexibility based on social schedule  - controlling GI symptoms with omeprazole, miralax, ginger regimen - Continue pulmonary rehab  - keep March 14, 2021 2nd opinion appt with Dr Wynn Maudlin at [place] instructions from [place] to take CD-ROM with you to [place] appt  - I will email Dr Randol Kern to let you know you are coming  - PFT test will likely be at [facility] - appreciate clinical trial interst in future -   Chronic obstructive pulmonary disease, unspecified COPD type ([place])  - not in flare up  Plan  - cotninue breztri daily - albueo as needed  GERD   - appears omeprazole helping you  Plan  - continue omeprazole on empty stomach - as it has potential beneficial effect for IPF  Weight loss- drug induced Low appetite   -ongoing weight loss after starting esbriet is due to esbriet and jardiance and related low appetite  - current weight is 199# and BMI 28.64  Plan  -monitor; ok to tolerate weight loss till 170- 175#   Followup - Late April 2023  - 30 min visit; symptoms score and walk test at followup    Follolwup (make all appointments now)  - Video visit with nurse practitioner in 4 weeks

## 2021-03-13 ENCOUNTER — Other Ambulatory Visit: Payer: Self-pay

## 2021-03-13 ENCOUNTER — Encounter (HOSPITAL_COMMUNITY)
Admission: RE | Admit: 2021-03-13 | Discharge: 2021-03-13 | Disposition: A | Discharge status: Home / Self Care | Payer: Medicare Other | Source: Ambulatory Visit | Attending: Internal Medicine | Admitting: Internal Medicine

## 2021-03-13 ENCOUNTER — Other Ambulatory Visit: Payer: Self-pay | Admitting: Emergency Medicine

## 2021-03-13 DIAGNOSIS — J449 Chronic obstructive pulmonary disease, unspecified: Secondary | ICD-10-CM

## 2021-03-13 DIAGNOSIS — E7800 Pure hypercholesterolemia, unspecified: Secondary | ICD-10-CM | POA: Diagnosis not present

## 2021-03-13 DIAGNOSIS — J84112 Idiopathic pulmonary fibrosis: Secondary | ICD-10-CM

## 2021-03-13 DIAGNOSIS — J439 Emphysema, unspecified: Secondary | ICD-10-CM | POA: Diagnosis not present

## 2021-03-13 NOTE — Progress Notes (Signed)
Daily Session Note  Patient Details  Name: [name] MRN: [id number] Date of Birth: 07-25-1940 Referring Provider:   April Manson Pulmonary Rehab Walk Test from 02/28/2021 in [place]  Referring Provider Ramaswamy       Encounter Date: 03/13/2021  Check In:  Session Check In - 03/13/21 1425       Check-In   Supervising physician immediately available to respond to emergencies [facility] immediately available    Physician(s) Dr. Eliseo Squires    Location [facility]    Staff Present Rosebud Poles, RN, BSN;Carlette Wilber Oliphant, RN, Quentin Ore, MS, ACSM-CEP, Exercise Physiologist;Lisa Ysidro Evert, RN    Virtual Visit No    Medication changes reported     No    Fall or balance concerns reported    No    Tobacco Cessation No Change    Warm-up and Cool-down Performed as group-led instruction    Resistance Training Performed Yes    VAD Patient? No    PAD/SET Patient? No      Pain Assessment   Currently in Pain? No/denies    Multiple Pain Sites No             Capillary Blood Glucose: No results found. However, due to the size of the patient record, not all encounters were searched. Please check Results Review for a complete set of results.    Social History   Tobacco Use  Smoking Status Former   Packs/day: 2.00   Years: 15.00   Pack years: 30.00   Types: Cigarettes   Quit date: 01/15/1980   Years since quitting: 41.1  Smokeless Tobacco Never    Goals Met:  Exercise tolerated well No report of concerns or symptoms today Strength training completed today  Goals Unmet:  Not Applicable  Comments: Service time is from 1321 to 1438.    Dr. Rodman Pickle is Medical Director for Pulmonary Rehab at [facility]

## 2021-03-14 DIAGNOSIS — R053 Chronic cough: Secondary | ICD-10-CM | POA: Diagnosis not present

## 2021-03-14 DIAGNOSIS — Z87891 Personal history of nicotine dependence: Secondary | ICD-10-CM | POA: Diagnosis not present

## 2021-03-14 DIAGNOSIS — J984 Other disorders of lung: Secondary | ICD-10-CM | POA: Diagnosis not present

## 2021-03-14 DIAGNOSIS — R0602 Shortness of breath: Secondary | ICD-10-CM | POA: Diagnosis not present

## 2021-03-14 DIAGNOSIS — K219 Gastro-esophageal reflux disease without esophagitis: Secondary | ICD-10-CM | POA: Diagnosis not present

## 2021-03-14 DIAGNOSIS — J84111 Idiopathic interstitial pneumonia, not otherwise specified: Secondary | ICD-10-CM | POA: Diagnosis not present

## 2021-03-14 NOTE — Progress Notes (Signed)
Pulmonary Individual Treatment Plan  Patient Details  Name: [name] MRN: [id number] Date of Birth: 1940-08-14 Referring Provider:   April Manson Pulmonary Rehab Walk Test from 02/28/2021 in [place]  Referring Provider Ramaswamy       Initial Encounter Date:  Flowsheet Row Pulmonary Rehab Walk Test from 02/28/2021 in [place]  Date 02/28/21       Visit Diagnosis: Idiopathic pulmonary fibrosis ([place])  Patient's Home Medications on Admission:   Current Outpatient Medications:    Budeson-Glycopyrrol-Formoterol (BREZTRI AEROSPHERE) 160-9-4.8 MCG/ACT AERO, INHALE 2 PUFFS INTO THE LUNGS IN THE MORNING AND AT BEDTIME, Disp: 10.7 g, Rfl: 0   antiseptic oral rinse (BIOTENE) LIQD, 15 mLs by Mouth Rinse route in the morning and at bedtime., Disp: , Rfl:    carvedilol (COREG) 3.125 MG tablet, Take 1 tablet (3.125 mg total) by mouth 2 (two) times daily with a meal., Disp: 180 tablet, Rfl: 3   cholecalciferol (VITAMIN D) 1000 units tablet, Take 1,000 Units by mouth every evening., Disp: , Rfl:    empagliflozin (JARDIANCE) 10 MG TABS tablet, Take 1 tablet (10 mg total) by mouth daily before breakfast., Disp: 90 tablet, Rfl: 3   ENTRESTO 24-26 MG, TAKE 1 TABLET BY MOUTH TWICE DAILY, Disp: 180 tablet, Rfl: 3   eplerenone (INSPRA) 25 MG tablet, Take 1 tablet (25 mg total) by mouth daily., Disp: 30 tablet, Rfl: 11   furosemide (LASIX) 40 MG tablet, TAKE 2 TABLETS BY MOUTH EVERY DAY, Disp: 90 tablet, Rfl: 3   Ginger, Zingiber officinalis, (GINGER ROOT) 550 MG CAPS, Take by mouth., Disp: , Rfl:    ipratropium (ATROVENT) 0.06 % nasal spray, Place 2 sprays into both nostrils 3 (three) times daily., Disp: 15 mL, Rfl: 1   levalbuterol (XOPENEX HFA) 45 MCG/ACT inhaler, Inhale 2 puffs into the lungs every 4 (four) hours as needed for wheezing., Disp: 1 each, Rfl: 12   loratadine (CLARITIN) 10 MG tablet, Take 10 mg by mouth in the morning.,  Disp: , Rfl:    Magnesium 250 MG TABS, Take 250 mg by mouth every evening., Disp: , Rfl:    mexiletine (MEXITIL) 150 MG capsule, TAKE 2 CAPSULES(300 MG) BY MOUTH TWICE DAILY, Disp: 360 capsule, Rfl: 2   Multiple Vitamin (MULTIVITAMIN WITH MINERALS) TABS, Take 1 tablet by mouth every evening., Disp: , Rfl:    nitroGLYCERIN (NITROSTAT) 0.4 MG SL tablet, DISSOLVE 1 TABLET UNDER THE TONGUE EVERY 5 MINUTES AS NEEDED FOR CHEST PAIN (Patient taking differently: Place 0.4 mg under the tongue every 5 (five) minutes x 3 doses as needed for chest pain.), Disp: 25 tablet, Rfl: 5   Omeprazole 20 MG TBEC, SMARTSIG:1 By Mouth, Disp: , Rfl:    Pirfenidone (ESBRIET) 267 MG TABS, Take 2 tablets (534 mg total) by mouth with breakfast, with lunch, and with evening meal., Disp: 180 tablet, Rfl: 5   polyvinyl alcohol-povidone (HYPOTEARS) 1.4-0.6 % ophthalmic solution, Place 1-2 drops into both eyes in the morning and at bedtime., Disp: , Rfl:    potassium chloride SA (KLOR-CON) 20 MEQ tablet, Take 1 tablet (20 mEq total) by mouth daily with lunch., Disp: 90 tablet, Rfl: 2   rosuvastatin (CRESTOR) 40 MG tablet, Take 1 tablet (40 mg total) by mouth every evening., Disp: 90 tablet, Rfl: 2   Simethicone (GAS-X PO), Take by mouth., Disp: , Rfl:    XARELTO 20 MG TABS tablet, TAKE 1 TABLET(20 MG) BY MOUTH DAILY WITH SUPPER,  Disp: 90 tablet, Rfl: 1  Past Medical History: Past Medical History:  Diagnosis Date   Adenomatous colon polyp 2009; 06/2012   Colonoscopy 07/2007---Dr. Barron Sch[facility]--Repeat 06/2012 showed one tubular adenoma w/out high grade dysplasia.   AICD (automatic cardioverter/defibrillator) present    Chemical engineer   Atrial fibrillation ([place])    Bilateral sensorineural hearing loss    BPH (benign prostatic hypertrophy)    PSAs ok (followed by urologist): no LUTS   CAD (coronary artery disease)    PTCA RCA 1997 (Inf/post MI);   CHF (congestive heart failure) ([place])    Chronic constipation    Chronic  rhinitis    ?vasomotor (Dr. Erik Obey)   COPD (chronic obstructive pulmonary disease) ([place])    Diverticulosis of colon 2005   Noted on Colonoscopies in 2005 and 2009 and on CT 2010   Gallstones 06/2014   No evidence of cholecystitis on abd u/s 06/2014   GERD (gastroesophageal reflux disease)    Hepatic steatosis 2010; 2016   Noted on CT abd 2010 and on u/s abd 04/2009 (transaminasemia) and u/s 06/2014.   Hyperlipidemia    Hypertension    Ischemic cardiomyopathy    Microscopic hematuria    Cysto neg 02/2009 except for BPH-likely has microhem from his renal stone dz   Nephrolithiasis    No distal stones on CT 2010; stable 8 mm RLP stone, 52m LMP stone 10/2013 plain film.  Abd u/s 06/2014: 9 mm nonobstructing lower pole right renal stone.  03/2015 urol: stable nonobstructing renal calc by CT 12/2014 and KUB 03/2015.   Obstructive sleep apnea    no cpap uses since weight loss of 50 pounds   Presence of permanent cardiac pacemaker    [employer]   Squamous cell carcinoma of back    Ventricular tachycardia     Tobacco Use: Social History   Tobacco Use  Smoking Status Former   Packs/day: 2.00   Years: 15.00   Pack years: 30.00   Types: Cigarettes   Quit date: 01/15/1980   Years since quitting: 41.1  Smokeless Tobacco Never    Labs: Recent Review Flowsheet Data     Labs for ITP Cardiac and Pulmonary Rehab Latest Ref Rng & Units 08/09/2019 11/17/2019 05/17/2020 11/16/2020 11/17/2020   Cholestrol 0 - 200 mg/dL 130 127 135 124 -   LDLCALC 0 - 99 mg/dL 62 70 81 70 -   LDLDIRECT mg/dL - - - - -   HDL >39.00 mg/dL 30.30(L) 36.30(L) 32.50(L) 34.80(L) -   Trlycerides 0.0 - 149.0 mg/dL 191.0(H) 102.0 105.0 98.0 -   Hemoglobin A1c 4.6 - 6.5 % - - - - 6.0   PHART 7.350 - 7.450 - - - - -   PCO2ART 35.0 - 45.0 mmHg - - - - -   HCO3 20.0 - 24.0 mEq/L - - - - -   TCO2 0 - 100 mmol/L - - - - -   ACIDBASEDEF 0.0 - 2.0 mmol/L - - - - -   O2SAT % - - - - -       Capillary Blood Glucose: Lab  Results  Component Value Date   GLUCAP 101 (H) 09/26/2012   GLUCAP 107 (H) 09/26/2012   GLUCAP 100 (H) 09/25/2012   GLUCAP 108 (H) 12/10/2009     Pulmonary Assessment Scores:  Pulmonary Assessment Scores     Row Name 02/28/21 1153         ADL UCSD   ADL Phase Entry  SOB Score total 42       CAT Score   CAT Score 29       mMRC Score   mMRC Score 2             UCSD: Self-administered rating of dyspnea associated with activities of daily living (ADLs) 6-point scale (0 = "not at all" to 5 = "maximal or unable to do because of breathlessness")  Scoring Scores range from 0 to 120.  Minimally important difference is 5 units  CAT: CAT can identify the health impairment of COPD patients and is better correlated with disease progression.  CAT has a scoring range of zero to 40. The CAT score is classified into four groups of low (less than 10), medium (10 - 20), high (21-30) and very high (31-40) based on the impact level of disease on health status. A CAT score over 10 suggests significant symptoms.  A worsening CAT score could be explained by an exacerbation, poor medication adherence, poor inhaler technique, or progression of COPD or comorbid conditions.  CAT MCID is 2 points  mMRC: mMRC (Modified Medical Research Council) Dyspnea Scale is used to assess the degree of baseline functional disability in patients of respiratory disease due to dyspnea. No minimal important difference is established. A decrease in score of 1 point or greater is considered a positive change.   Pulmonary Function Assessment:  Pulmonary Function Assessment - 02/28/21 1156       Breath   Bilateral Breath Sounds Rales    Shortness of Breath Yes;Limiting activity             Exercise Target Goals: Exercise Program Goal: Individual exercise prescription set using results from initial 6 min walk test and THRR while considering  patients activity barriers and safety.   Exercise  Prescription Goal: Initial exercise prescription builds to 30-45 minutes a day of aerobic activity, 2-3 days per week.  Home exercise guidelines will be given to patient during program as part of exercise prescription that the participant will acknowledge.  Activity Barriers & Risk Stratification:  Activity Barriers & Cardiac Risk Stratification - 02/28/21 1138       Activity Barriers & Cardiac Risk Stratification   Activity Barriers History of Falls;Deconditioning;Muscular Weakness;Shortness of Breath   History of falls due to BP med that was d/c   Cardiac Risk Stratification Moderate             6 Minute Walk:  6 Minute Walk     Row Name 02/28/21 1256         6 Minute Walk   Phase Initial     Distance 1220 feet     Walk Time 6 minutes     # of Rest Breaks 0     MPH 2.31     METS 2.74     RPE 9     Perceived Dyspnea  0     VO2 Peak 9.6     Symptoms No     Resting HR 70 bpm     Resting BP 108/68     Resting Oxygen Saturation  96 %     Exercise Oxygen Saturation  during 6 min walk 94 %     Max Ex. HR 143 bpm     Max Ex. BP 138/68     2 Minute Post BP 110/60       Interval HR   1 Minute HR 116     2 Minute HR 114  3 Minute HR 116     4 Minute HR 129     5 Minute HR 143     6 Minute HR 140     2 Minute Post HR 70     Interval Heart Rate? Yes       Interval Oxygen   Interval Oxygen? Yes     Baseline Oxygen Saturation % 96 %     1 Minute Oxygen Saturation % 96 %     1 Minute Liters of Oxygen 0 L     2 Minute Oxygen Saturation % 95 %     2 Minute Liters of Oxygen 0 L     3 Minute Oxygen Saturation % 94 %     3 Minute Liters of Oxygen 0 L     4 Minute Oxygen Saturation % 94 %     4 Minute Liters of Oxygen 0 L     5 Minute Oxygen Saturation % 95 %     5 Minute Liters of Oxygen 0 L     6 Minute Oxygen Saturation % 95 %     6 Minute Liters of Oxygen 0 L     2 Minute Post Oxygen Saturation % 97 %     2 Minute Post Liters of Oxygen 0 L               Oxygen Initial Assessment:  Oxygen Initial Assessment - 02/28/21 1135       Home Oxygen   Home Oxygen Device None    Sleep Oxygen Prescription None    Home Exercise Oxygen Prescription None    Home Resting Oxygen Prescription None      Initial 6 min Walk   Oxygen Used None      Program Oxygen Prescription   Program Oxygen Prescription None      Intervention   Short Term Goals To learn and exhibit compliance with exercise, home and travel O2 prescription;To learn and understand importance of monitoring SPO2 with pulse oximeter and demonstrate accurate use of the pulse oximeter.;To learn and understand importance of maintaining oxygen saturations>88%;To learn and demonstrate proper pursed lip breathing techniques or other breathing techniques. ;To learn and demonstrate proper use of respiratory medications    Long  Term Goals Exhibits compliance with exercise, home  and travel O2 prescription;Verbalizes importance of monitoring SPO2 with pulse oximeter and return demonstration;Maintenance of O2 saturations>88%;Exhibits proper breathing techniques, such as pursed lip breathing or other method taught during program session;Compliance with respiratory medication;Demonstrates proper use of MDIs             Oxygen Re-Evaluation:  Oxygen Re-Evaluation     Row Name 03/06/21 1542             Program Oxygen Prescription   Program Oxygen Prescription None         Home Oxygen   Home Oxygen Device None       Sleep Oxygen Prescription None       Home Exercise Oxygen Prescription None       Home Resting Oxygen Prescription None         Goals/Expected Outcomes   Short Term Goals To learn and exhibit compliance with exercise, home and travel O2 prescription;To learn and understand importance of monitoring SPO2 with pulse oximeter and demonstrate accurate use of the pulse oximeter.;To learn and understand importance of maintaining oxygen saturations>88%;To learn and demonstrate  proper pursed lip breathing techniques or other breathing techniques. ;To learn and demonstrate proper  use of respiratory medications       Long  Term Goals Exhibits compliance with exercise, home  and travel O2 prescription;Verbalizes importance of monitoring SPO2 with pulse oximeter and return demonstration;Maintenance of O2 saturations>88%;Exhibits proper breathing techniques, such as pursed lip breathing or other method taught during program session;Compliance with respiratory medication;Demonstrates proper use of MDIs       Goals/Expected Outcomes Compliance and understanding of oxygen saturation and breathing techniques to decrease shortness of breath                Oxygen Discharge (Final Oxygen Re-Evaluation):  Oxygen Re-Evaluation - 03/06/21 1542       Program Oxygen Prescription   Program Oxygen Prescription None      Home Oxygen   Home Oxygen Device None    Sleep Oxygen Prescription None    Home Exercise Oxygen Prescription None    Home Resting Oxygen Prescription None      Goals/Expected Outcomes   Short Term Goals To learn and exhibit compliance with exercise, home and travel O2 prescription;To learn and understand importance of monitoring SPO2 with pulse oximeter and demonstrate accurate use of the pulse oximeter.;To learn and understand importance of maintaining oxygen saturations>88%;To learn and demonstrate proper pursed lip breathing techniques or other breathing techniques. ;To learn and demonstrate proper use of respiratory medications    Long  Term Goals Exhibits compliance with exercise, home  and travel O2 prescription;Verbalizes importance of monitoring SPO2 with pulse oximeter and return demonstration;Maintenance of O2 saturations>88%;Exhibits proper breathing techniques, such as pursed lip breathing or other method taught during program session;Compliance with respiratory medication;Demonstrates proper use of MDIs    Goals/Expected Outcomes Compliance and  understanding of oxygen saturation and breathing techniques to decrease shortness of breath             Initial Exercise Prescription:  Initial Exercise Prescription - 02/28/21 1200       Date of Initial Exercise RX and Referring Provider   Date 02/28/21    Referring Provider Ramaswamy    Expected Discharge Date 05/03/21      Recumbant Bike   Level --    Pascal Lux --    Minutes --      NuStep   Level 1    SPM 70    Minutes 15    METs 2.74      Track   Minutes 15      Prescription Details   Frequency (times per week) 2    Duration Progress to 30 minutes of continuous aerobic without signs/symptoms of physical distress      Intensity   THRR 40-80% of Max Heartrate 56-113    Ratings of Perceived Exertion 11-13    Perceived Dyspnea 0-4      Progression   Progression Continue to progress workloads to maintain intensity without signs/symptoms of physical distress.      Resistance Training   Training Prescription Yes    Weight blue bands    Reps 10-15             Perform Capillary Blood Glucose checks as needed.  Exercise Prescription Changes:   Exercise Prescription Changes     Row Name 03/06/21 1500             Response to Exercise   Blood Pressure (Admit) 122/64       Blood Pressure (Exercise) 94/60       Blood Pressure (Exit) 100/58       Heart Rate (Admit) 70 bpm  Heart Rate (Exercise) 89 bpm       Heart Rate (Exit) 70 bpm       Oxygen Saturation (Admit) 97 %       Oxygen Saturation (Exercise) 94 %       Oxygen Saturation (Exit) 97 %       Rating of Perceived Exertion (Exercise) 11       Perceived Dyspnea (Exercise) 1       Duration Continue with 30 min of aerobic exercise without signs/symptoms of physical distress.       Intensity THRR unchanged         Progression   Progression Continue to progress workloads to maintain intensity without signs/symptoms of physical distress.         Resistance Training   Training Prescription Yes        Weight blue bands       Reps 10-15       Time 10 Minutes         NuStep   Level 2       SPM 80       Minutes 15       METs 1.6         Track   Laps 14       Minutes 15       METs 2.63                Exercise Comments:   Exercise Comments     Row Name 03/06/21 1522           Exercise Comments Pt completed 1st day of exercise. [name] exercised for 15 min on the track and Nustep. He averaged 2.63 METs on the track and 1.6 METs at level 2. [name] was originally on level 1 but that was easy for him. He performed the warmup and cooldown standing without limitations.                Exercise Goals and Review:   Exercise Goals     Row Name 02/28/21 1139 03/06/21 1537           Exercise Goals   Increase Physical Activity Yes Yes      Intervention Provide advice, education, support and counseling about physical activity/exercise needs.;Develop an individualized exercise prescription for aerobic and resistive training based on initial evaluation findings, risk stratification, comorbidities and participant's personal goals. Provide advice, education, support and counseling about physical activity/exercise needs.;Develop an individualized exercise prescription for aerobic and resistive training based on initial evaluation findings, risk stratification, comorbidities and participant's personal goals.      Expected Outcomes Short Term: Attend rehab on a regular basis to increase amount of physical activity.;Long Term: Add in home exercise to make exercise part of routine and to increase amount of physical activity.;Long Term: Exercising regularly at least 3-5 days a week. Short Term: Attend rehab on a regular basis to increase amount of physical activity.;Long Term: Add in home exercise to make exercise part of routine and to increase amount of physical activity.;Long Term: Exercising regularly at least 3-5 days a week.      Increase Strength and Stamina Yes Yes      Intervention  Provide advice, education, support and counseling about physical activity/exercise needs.;Develop an individualized exercise prescription for aerobic and resistive training based on initial evaluation findings, risk stratification, comorbidities and participant's personal goals. Provide advice, education, support and counseling about physical activity/exercise needs.;Develop an individualized exercise prescription for aerobic and resistive training based  on initial evaluation findings, risk stratification, comorbidities and participant's personal goals.      Expected Outcomes Short Term: Increase workloads from initial exercise prescription for resistance, speed, and METs.;Short Term: Perform resistance training exercises routinely during rehab and add in resistance training at home;Long Term: Improve cardiorespiratory fitness, muscular endurance and strength as measured by increased METs and functional capacity (6MWT) Short Term: Increase workloads from initial exercise prescription for resistance, speed, and METs.;Short Term: Perform resistance training exercises routinely during rehab and add in resistance training at home;Long Term: Improve cardiorespiratory fitness, muscular endurance and strength as measured by increased METs and functional capacity (6MWT)      Able to understand and use rate of perceived exertion (RPE) scale Yes Yes      Intervention Provide education and explanation on how to use RPE scale Provide education and explanation on how to use RPE scale      Expected Outcomes Short Term: Able to use RPE daily in rehab to express subjective intensity level;Long Term:  Able to use RPE to guide intensity level when exercising independently Short Term: Able to use RPE daily in rehab to express subjective intensity level;Long Term:  Able to use RPE to guide intensity level when exercising independently      Able to understand and use Dyspnea scale Yes Yes      Intervention Provide education and  explanation on how to use Dyspnea scale Provide education and explanation on how to use Dyspnea scale      Expected Outcomes Short Term: Able to use Dyspnea scale daily in rehab to express subjective sense of shortness of breath during exertion;Long Term: Able to use Dyspnea scale to guide intensity level when exercising independently Short Term: Able to use Dyspnea scale daily in rehab to express subjective sense of shortness of breath during exertion;Long Term: Able to use Dyspnea scale to guide intensity level when exercising independently      Knowledge and understanding of Target Heart Rate Range (THRR) Yes Yes      Intervention Provide education and explanation of THRR including how the numbers were predicted and where they are located for reference Provide education and explanation of THRR including how the numbers were predicted and where they are located for reference      Expected Outcomes Short Term: Able to state/look up THRR;Long Term: Able to use THRR to govern intensity when exercising independently;Short Term: Able to use daily as guideline for intensity in rehab Short Term: Able to state/look up THRR;Long Term: Able to use THRR to govern intensity when exercising independently;Short Term: Able to use daily as guideline for intensity in rehab      Understanding of Exercise Prescription Yes Yes      Intervention Provide education, explanation, and written materials on patient's individual exercise prescription Provide education, explanation, and written materials on patient's individual exercise prescription      Expected Outcomes Short Term: Able to explain program exercise prescription;Long Term: Able to explain home exercise prescription to exercise independently Short Term: Able to explain program exercise prescription;Long Term: Able to explain home exercise prescription to exercise independently               Exercise Goals Re-Evaluation :  Exercise Goals Re-Evaluation     Row  Name 03/06/21 1538             Exercise Goal Re-Evaluation   Exercise Goals Review Increase Physical Activity;Increase Strength and Stamina;Able to understand and use rate of perceived exertion (RPE)  scale;Able to understand and use Dyspnea scale;Knowledge and understanding of Target Heart Rate Range (THRR);Understanding of Exercise Prescription       Comments [name] has completed 1 exercise session. He exercises for 15 min on the track and Nustep. [name] averages 2.63 METs on the track and 1.6 METs at level 2 on the Nustep. [name] increased his workload on the Nustep today as he tolerated this well. Will discuss how to increase METs with him. He performed the warmup and cooldown standing without limitations. It is too soon to note any discernable progressions. Will continue to monitor.       Expected Outcomes Through exercise at rehab and home, the patient will decrease shortness of breath with daily activities and feel confident in carrying out an exercise regimen at home.                Discharge Exercise Prescription (Final Exercise Prescription Changes):  Exercise Prescription Changes - 03/06/21 1500       Response to Exercise   Blood Pressure (Admit) 122/64    Blood Pressure (Exercise) 94/60    Blood Pressure (Exit) 100/58    Heart Rate (Admit) 70 bpm    Heart Rate (Exercise) 89 bpm    Heart Rate (Exit) 70 bpm    Oxygen Saturation (Admit) 97 %    Oxygen Saturation (Exercise) 94 %    Oxygen Saturation (Exit) 97 %    Rating of Perceived Exertion (Exercise) 11    Perceived Dyspnea (Exercise) 1    Duration Continue with 30 min of aerobic exercise without signs/symptoms of physical distress.    Intensity THRR unchanged      Progression   Progression Continue to progress workloads to maintain intensity without signs/symptoms of physical distress.      Resistance Training   Training Prescription Yes    Weight blue bands    Reps 10-15    Time 10 Minutes      NuStep   Level 2     SPM 80    Minutes 15    METs 1.6      Track   Laps 14    Minutes 15    METs 2.63             Nutrition:  Target Goals: Understanding of nutrition guidelines, daily intake of sodium <1576m, cholesterol <2055m calories 30% from fat and 7% or less from saturated fats, daily to have 5 or more servings of fruits and vegetables.  Biometrics:  Pre Biometrics - 02/28/21 1103       Pre Biometrics   Grip Strength 21 kg              Nutrition Therapy Plan and Nutrition Goals:   Nutrition Assessments:  MEDIFICTS Score Key: ?70 Need to make dietary changes  40-70 Heart Healthy Diet ? 40 Therapeutic Level Cholesterol Diet   Picture Your Plate Scores: <4<85nhealthy dietary pattern with much room for improvement. 41-50 Dietary pattern unlikely to meet recommendations for good health and room for improvement. 51-60 More healthful dietary pattern, with some room for improvement.  >60 Healthy dietary pattern, although there may be some specific behaviors that could be improved.    Nutrition Goals Re-Evaluation:   Nutrition Goals Discharge (Final Nutrition Goals Re-Evaluation):   Psychosocial: Target Goals: Acknowledge presence or absence of significant depression and/or stress, maximize coping skills, provide positive support system. Participant is able to verbalize types and ability to use techniques and skills needed for reducing stress and depression.  Initial Review & Psychosocial Screening:  Initial Psych Review & Screening - 02/28/21 1126       Initial Review   Current issues with None Identified      Family Dynamics   Good Support System? Yes    Comments wife, 3 sons      Barriers   Psychosocial barriers to participate in program There are no identifiable barriers or psychosocial needs.      Screening Interventions   Interventions Encouraged to exercise             Quality of Life Scores:  Scores of 19 and below usually indicate a poorer  quality of life in these areas.  A difference of  2-3 points is a clinically meaningful difference.  A difference of 2-3 points in the total score of the Quality of Life Index has been associated with significant improvement in overall quality of life, self-image, physical symptoms, and general health in studies assessing change in quality of life.  PHQ-9: Recent Review Flowsheet Data     Depression screen [facility] 2/9 02/28/2021 11/16/2020 11/06/2020 05/17/2020 11/17/2019   Decreased Interest 0 2 0 0 0   Down, Depressed, Hopeless 0 1 0 0 0   PHQ - 2 Score 0 3 0 0 0   Altered sleeping 1  1 - 0 0   Tired, decreased energy 1  2 - 0 0   Change in appetite 0 0 - 0 0   Feeling bad or failure about yourself  0 0 - 0 0   Trouble concentrating 0 0 - 0 0   Moving slowly or fidgety/restless 0 0 - 0 0   Suicidal thoughts 0 0 - 0 0   PHQ-9 Score 2 6 - 0 0   Difficult doing work/chores Very difficult - - Not difficult at all Not difficult at all      Interpretation of Total Score  Total Score Depression Severity:  1-4 = Minimal depression, 5-9 = Mild depression, 10-14 = Moderate depression, 15-19 = Moderately severe depression, 20-27 = Severe depression   Psychosocial Evaluation and Intervention:  Psychosocial Evaluation - 02/28/21 1127       Psychosocial Evaluation & Interventions   Interventions Encouraged to exercise with the program and follow exercise prescription    Expected Outcomes For [name] to participate in Pulmonary Rehab    Continue Psychosocial Services  Follow up required by staff             Psychosocial Re-Evaluation:  Psychosocial Re-Evaluation     Row Name 03/14/21 [phone]             Psychosocial Re-Evaluation   Current issues with None Identified       Comments [name] has attended 3 exercise sesssion and has been able to participate in the PR program with no identiable psychosocial barriers. He states that he is enjoying the program and feels that he improving in his SOB and  strength.He is walking the track and using the nustep.       Expected Outcomes For [name] to conitnue to participate in the PR program with no psychosocial concerns or issues.       Interventions Encouraged to attend Pulmonary Rehabilitation for the exercise       Continue Psychosocial Services  No Follow up required                Psychosocial Discharge (Final Psychosocial Re-Evaluation):  Psychosocial Re-Evaluation - 03/14/21 0923  Psychosocial Re-Evaluation   Current issues with None Identified    Comments [name] has attended 3 exercise sesssion and has been able to participate in the PR program with no identiable psychosocial barriers. He states that he is enjoying the program and feels that he improving in his SOB and strength.He is walking the track and using the nustep.    Expected Outcomes For [name] to conitnue to participate in the PR program with no psychosocial concerns or issues.    Interventions Encouraged to attend Pulmonary Rehabilitation for the exercise    Continue Psychosocial Services  No Follow up required             Education: Education Goals: Education classes will be provided on a weekly basis, covering required topics. Participant will state understanding/return demonstration of topics presented.  Learning Barriers/Preferences:  Learning Barriers/Preferences - 02/28/21 1131       Learning Barriers/Preferences   Learning Barriers None    Learning Preferences Written Material;Pictoral;Group Instruction;Video;Skilled Demonstration;Computer/Internet;Audio;Individual Instruction             Education Topics: Risk Factor Reduction:  -Group instruction that is supported by a PowerPoint presentation. Instructor discusses the definition of a risk factor, different risk factors for pulmonary disease, and how the heart and lungs work together.     Nutrition for Pulmonary Patient:  -Group instruction provided by PowerPoint slides, verbal discussion, and  written materials to support subject matter. The instructor gives an explanation and review of healthy diet recommendations, which includes a discussion on weight management, recommendations for fruit and vegetable consumption, as well as protein, fluid, caffeine, fiber, sodium, sugar, and alcohol. Tips for eating when patients are short of breath are discussed.   Pursed Lip Breathing:  -Group instruction that is supported by demonstration and informational handouts. Instructor discusses the benefits of pursed lip and diaphragmatic breathing and detailed demonstration on how to preform both.   Flowsheet Row PULMONARY REHAB OTHER RESPIRATORY from 03/08/2021 in [place]  Date 03/08/21  Educator Donnetta Simpers  [Handout]       Oxygen Safety:  -Group instruction provided by PowerPoint, verbal discussion, and written material to support subject matter. There is an overview of What is Oxygen and Why do we need it.  Instructor also reviews how to create a safe environment for oxygen use, the importance of using oxygen as prescribed, and the risks of noncompliance. There is a brief discussion on traveling with oxygen and resources the patient may utilize.   Oxygen Equipment:  -Group instruction provided by [facility] Staff utilizing handouts, written materials, and equipment demonstrations.   Signs and Symptoms:  -Group instruction provided by written material and verbal discussion to support subject matter. Warning signs and symptoms of infection, stroke, and heart attack are reviewed and when to call the physician/911 reinforced. Tips for preventing the spread of infection discussed.   Advanced Directives:  -Group instruction provided by verbal instruction and written material to support subject matter. Instructor reviews Advanced Directive laws and proper instruction for filling out document.   Pulmonary Video:  -Group video education that reviews the importance  of medication and oxygen compliance, exercise, good nutrition, pulmonary hygiene, and pursed lip and diaphragmatic breathing for the pulmonary patient.   Exercise for the Pulmonary Patient:  -Group instruction that is supported by a PowerPoint presentation. Instructor discusses benefits of exercise, core components of exercise, frequency, duration, and intensity of an exercise routine, importance of utilizing pulse oximetry during exercise, safety while exercising, and  options of places to exercise outside of rehab.     Pulmonary Medications:  -Verbally interactive group education provided by instructor with focus on inhaled medications and proper administration.   Anatomy and Physiology of the Respiratory System and Intimacy:  -Group instruction provided by PowerPoint, verbal discussion, and written material to support subject matter. Instructor reviews respiratory cycle and anatomical components of the respiratory system and their functions. Instructor also reviews differences in obstructive and restrictive respiratory diseases with examples of each. Intimacy, Sex, and Sexuality differences are reviewed with a discussion on how relationships can change when diagnosed with pulmonary disease. Common sexual concerns are reviewed.   MD DAY -A group question and answer session with a medical doctor that allows participants to ask questions that relate to their pulmonary disease state.   OTHER EDUCATION -Group or individual verbal, written, or video instructions that support the educational goals of the pulmonary rehab program.   Holiday Eating Survival Tips:  -Group instruction provided by PowerPoint slides, verbal discussion, and written materials to support subject matter. The instructor gives patients tips, tricks, and techniques to help them not only survive but enjoy the holidays despite the onslaught of food that accompanies the holidays.   Knowledge Questionnaire Score:  Knowledge  Questionnaire Score - 02/28/21 1152       Knowledge Questionnaire Score   Pre Score 14/18             Core Components/Risk Factors/Patient Goals at Admission:  Personal Goals and Risk Factors at Admission - 02/28/21 1127       Core Components/Risk Factors/Patient Goals on Admission    Weight Management Weight Loss    Improve shortness of breath with ADL's Yes    Intervention Provide education, individualized exercise plan and daily activity instruction to help decrease symptoms of SOB with activities of daily living.    Expected Outcomes Short Term: Improve cardiorespiratory fitness to achieve a reduction of symptoms when performing ADLs;Long Term: Be able to perform more ADLs without symptoms or delay the onset of symptoms    Increase knowledge of respiratory medications and ability to use respiratory devices properly  Yes    Intervention Provide education and demonstration as needed of appropriate use of medications, inhalers, and oxygen therapy.    Expected Outcomes Short Term: Achieves understanding of medications use. Understands that oxygen is a medication prescribed by physician. Demonstrates appropriate use of inhaler and oxygen therapy.;Long Term: Maintain appropriate use of medications, inhalers, and oxygen therapy.             Core Components/Risk Factors/Patient Goals Review:   Goals and Risk Factor Review     Row Name 03/14/21 0951             Core Components/Risk Factors/Patient Goals Review   Personal Goals Review Weight Management/Obesity;Improve shortness of breath with ADL's;Develop more efficient breathing techniques such as purse lipped breathing and diaphragmatic breathing and practicing self-pacing with activity.;Increase knowledge of respiratory medications and ability to use respiratory devices properly.       Review [name]'s weigth has been stable with no weight loss or gain since starting the program. He has had progressing or steady workloads on the  track and the nustep. He complains of no to mild SOB  and that he is working very light to fairly light during exercise. His oxygen saturations during exercise have been 94-96% on room air. He has no oxygen at home.       Expected Outcomes For [name] to have progressing  workloads and MET levels with his exercising.Also for him to have improving SOB with his ADL's and breathing techniques.                Core Components/Risk Factors/Patient Goals at Discharge (Final Review):   Goals and Risk Factor Review - 03/14/21 0951       Core Components/Risk Factors/Patient Goals Review   Personal Goals Review Weight Management/Obesity;Improve shortness of breath with ADL's;Develop more efficient breathing techniques such as purse lipped breathing and diaphragmatic breathing and practicing self-pacing with activity.;Increase knowledge of respiratory medications and ability to use respiratory devices properly.    Review [name]'s weigth has been stable with no weight loss or gain since starting the program. He has had progressing or steady workloads on the track and the nustep. He complains of no to mild SOB  and that he is working very light to fairly light during exercise. His oxygen saturations during exercise have been 94-96% on room air. He has no oxygen at home.    Expected Outcomes For [name] to have progressing workloads and MET levels with his exercising.Also for him to have improving SOB with his ADL's and breathing techniques.             ITP Comments:   Comments: ITP REVIEW Pt is making expected progress toward pulmonary rehab goals after completing 3 sessions. Recommend continued exercise, life style modification, education, and utilization of breathing techniques to increase stamina and strength and decrease shortness of breath with exertion.  Dr. Rodman Pickle is Medical Director for Pulmonary Rehab at [facility].

## 2021-03-15 ENCOUNTER — Other Ambulatory Visit: Payer: Self-pay

## 2021-03-15 ENCOUNTER — Encounter (HOSPITAL_COMMUNITY)
Admission: RE | Admit: 2021-03-15 | Discharge: 2021-03-15 | Disposition: A | Discharge status: Home / Self Care | Payer: Medicare Other | Source: Ambulatory Visit | Attending: Internal Medicine | Admitting: Internal Medicine

## 2021-03-15 DIAGNOSIS — J84112 Idiopathic pulmonary fibrosis: Secondary | ICD-10-CM | POA: Diagnosis not present

## 2021-03-15 NOTE — Progress Notes (Signed)
Daily Session Note ? ?Patient Details  ?Name: [name] ?MRN: [id number] ?Date of Birth: [birth year] ?Referring Provider:   ?Flowsheet Row Pulmonary Rehab Walk Test from 02/28/2021 in [place]  ?Referring Provider Ramaswamy  ? ?  ? ? ?Encounter Date: 03/15/2021 ? ?Check In: ? ? ?Capillary Blood Glucose: ?No results found. However, due to the size of the patient record, not all encounters were searched. Please check Results Review for a complete set of results. ? ? ? ?Social History  ? ?Tobacco Use  ?Smoking Status Former  ? Packs/day: 2.00  ? Years: 15.00  ? Pack years: 30.00  ? Types: Cigarettes  ? Quit date: 01/15/1980  ? Years since quitting: 41.1  ?Smokeless Tobacco Never  ? ? ?Goals Met:  ?Exercise tolerated well ?No report of concerns or symptoms today ?Strength training completed today ? ?Goals Unmet:  ?Not Applicable ? ?Comments: Service time is from 13232 to 1440 ? ? ? ?Dr. Rodman Pickle is Medical Director for Pulmonary Rehab at [facility].  ?

## 2021-03-16 ENCOUNTER — Other Ambulatory Visit: Payer: Self-pay | Admitting: Cardiology

## 2021-03-20 ENCOUNTER — Encounter (HOSPITAL_COMMUNITY)
Admission: RE | Admit: 2021-03-20 | Discharge: 2021-03-20 | Disposition: A | Discharge status: Home / Self Care | Payer: Medicare Other | Source: Ambulatory Visit | Attending: Internal Medicine | Admitting: Internal Medicine

## 2021-03-20 ENCOUNTER — Other Ambulatory Visit: Payer: Self-pay

## 2021-03-20 VITALS — Wt 197.8 lb

## 2021-03-20 DIAGNOSIS — J84112 Idiopathic pulmonary fibrosis: Secondary | ICD-10-CM | POA: Diagnosis not present

## 2021-03-20 NOTE — Progress Notes (Signed)
Daily Session Note ? ?Patient Details  ?Name: [name] ?MRN: [id number] ?Date of Birth: 1940/06/21 ?Referring Provider:   ?Flowsheet Row Pulmonary Rehab Walk Test from 02/28/2021 in [place]  ?Referring Provider Ramaswamy  ? ?  ? ? ?Encounter Date: 03/20/2021 ? ?Check In: ? Session Check In - 03/20/21 1428   ? ?  ? Check-In  ? Supervising physician immediately available to respond to emergencies [facility] immediately available   ? Physician(s) Dr. Cruzita Lederer   ? Location [facility]   ? Staff Present Trish Fountain, RN, BSN;Carlette Wilber Oliphant, RN, BSN;Lisa Ysidro Evert, Cathleen Fears, MS, ACSM-CEP, Exercise Physiologist;Annedrea Rosezella [place], RN, [place]   ? Virtual Visit No   ? Medication changes reported     No   ? Fall or balance concerns reported    No   ? Tobacco Cessation No Change   ? Warm-up and Cool-down Performed as group-led instruction   ? Resistance Training Performed Yes   ? VAD Patient? No   ? PAD/SET Patient? No   ?  ? Pain Assessment  ? Currently in Pain? No/denies   ? Multiple Pain Sites No   ? ?  ?  ? ?  ? ? ?Capillary Blood Glucose: ?No results found. However, due to the size of the patient record, not all encounters were searched. Please check Results Review for a complete set of results. ? ? Exercise Prescription Changes - 03/20/21 1500   ? ?  ? Response to Exercise  ? Blood Pressure (Admit) 106/72   ? Blood Pressure (Exercise) 110/64   ? Blood Pressure (Exit) 106/66   ? Heart Rate (Admit) 71 bpm   ? Heart Rate (Exercise) 71 bpm   ? Heart Rate (Exit) 70 bpm   ? Oxygen Saturation (Admit) 96 %   ? Oxygen Saturation (Exercise) 94 %   ? Oxygen Saturation (Exit) 95 %   ? Rating of Perceived Exertion (Exercise) 13   ? Perceived Dyspnea (Exercise) 2   ? Duration Continue with 30 min of aerobic exercise without signs/symptoms of physical distress.   ? Intensity THRR unchanged   ?  ? Progression  ? Progression Continue to progress workloads to maintain intensity  without signs/symptoms of physical distress.   ?  ? Resistance Training  ? Training Prescription Yes   ? Weight blue bands   ? Reps 10-15   ? Time 10 Minutes   ?  ? NuStep  ? Level 2   ? SPM 80   ? Minutes 15   ? METs 2.1   ?  ? Track  ? Laps 14   ? Minutes 15   ? METs 2.63   ? ?  ?  ? ?  ? ? ?Social History  ? ?Tobacco Use  ?Smoking Status Former  ? Packs/day: 2.00  ? Years: 15.00  ? Pack years: 30.00  ? Types: Cigarettes  ? Quit date: 01/15/1980  ? Years since quitting: 41.2  ?Smokeless Tobacco Never  ? ? ?Goals Met:  ?Proper associated with RPD/PD & O2 Sat ?Exercise tolerated well ?No report of concerns or symptoms today ?Strength training completed today ? ?Goals Unmet:  ?Not Applicable ? ?Comments: Service time is from 1317 to 1442.  ? ? ?Dr. Rodman Pickle is Medical Director for Pulmonary Rehab at [facility].  ?

## 2021-03-22 ENCOUNTER — Other Ambulatory Visit: Payer: Self-pay

## 2021-03-22 ENCOUNTER — Telehealth: Payer: Self-pay | Admitting: *Deleted

## 2021-03-22 ENCOUNTER — Encounter (HOSPITAL_COMMUNITY)
Admission: RE | Admit: 2021-03-22 | Discharge: 2021-03-22 | Disposition: A | Discharge status: Home / Self Care | Payer: Medicare Other | Source: Ambulatory Visit | Attending: Internal Medicine | Admitting: Internal Medicine

## 2021-03-22 DIAGNOSIS — J84112 Idiopathic pulmonary fibrosis: Secondary | ICD-10-CM

## 2021-03-22 NOTE — Progress Notes (Signed)
Daily Session Note ? ?Patient Details  ?Name: [name] ?MRN: [id number] ?Date of Birth: 01/23/1940 ?Referring Provider:   ?Flowsheet Row Pulmonary Rehab Walk Test from 02/28/2021 in [facility]  ?Referring Provider Ramaswamy  ? ?  ? ? ?Encounter Date: 03/22/2021 ? ?Check In: ? Session Check In - 03/22/21 1433   ? ?  ? Check-In  ? Supervising physician immediately available to respond to emergencies [facility] immediately available   ? Physician(s) Dr.Sheikh   ? Location [facility]   ? Staff Present  , RN;Other;Kaylee Davis, MS, ACSM-CEP, Exercise Physiologist   ? Virtual Visit No   ? Medication changes reported     No   ? Fall or balance concerns reported    No   ? Tobacco Cessation No Change   ? Warm-up and Cool-down Performed as group-led instruction   ? Resistance Training Performed Yes   ? VAD Patient? No   ? PAD/SET Patient? No   ?  ? Pain Assessment  ? Currently in Pain? No/denies   ? Multiple Pain Sites No   ? ?  ?  ? ?  ? ? ?Capillary Blood Glucose: ?No results found. However, due to the size of the patient record, not all encounters were searched. Please check Results Review for a complete set of results. ? ? ? ?Social History  ? ?Tobacco Use  ?Smoking Status Former  ? Packs/day: 2.00  ? Years: 15.00  ? Pack years: 30.00  ? Types: Cigarettes  ? Quit date: 01/15/1980  ? Years since quitting: 41.2  ?Smokeless Tobacco Never  ? ? ?Goals Met:  ?Exercise tolerated well ?No report of concerns or symptoms today ?Strength training completed today ? ?Goals Unmet:  ?Not Applicable ? ?Comments: Service time is from 1312 to 1445 ? ? ? ?Dr. Jane Ellison is Medical Director for Pulmonary Rehab at [facility].  ?

## 2021-03-22 NOTE — Chronic Care Management (AMB) (Signed)
?  Care Management  ? ?Note ? ?03/22/2021 ?Name: [name] MRN: [id number] DOB: [birth year] ? ?[name] is a 81 y.o. year old male who is a primary care patient of [name], [name], MD and is actively engaged with the care management team. I reached out to [name] by phone today to assist with scheduling an initial visit with the RN Case Manager ? ?Follow up plan: ?Telephone appointment with care management team member scheduled for:4/12/3 ? ?Laverda Sorenson  ?Care Guide, Embedded Care Coordination ?  Care Management  ?Direct Dial: [phone] ? ?

## 2021-03-26 ENCOUNTER — Other Ambulatory Visit: Payer: Self-pay

## 2021-03-26 MED ORDER — POTASSIUM CHLORIDE CRYS ER 20 MEQ PO TBCR: 20.0000 meq | EXTENDED_RELEASE_TABLET | Freq: Every day | ORAL | 3 refills | Status: DC

## 2021-03-27 ENCOUNTER — Encounter (HOSPITAL_COMMUNITY)
Admission: RE | Admit: 2021-03-27 | Discharge: 2021-03-27 | Disposition: A | Discharge status: Home / Self Care | Payer: Medicare Other | Source: Ambulatory Visit | Attending: Internal Medicine | Admitting: Internal Medicine

## 2021-03-27 ENCOUNTER — Other Ambulatory Visit: Payer: Self-pay

## 2021-03-27 DIAGNOSIS — J84112 Idiopathic pulmonary fibrosis: Secondary | ICD-10-CM

## 2021-03-27 NOTE — Progress Notes (Signed)
Daily Session Note ? ?Patient Details  ?Name: [name] ?MRN: [id number] ?Date of Birth: Apr 11, 1940 ?Referring Provider:   ?Flowsheet Row Pulmonary Rehab Walk Test from 02/28/2021 in [place]  ?Referring Provider Ramaswamy  ? ?  ? ? ?Encounter Date: 03/27/2021 ? ?Check In: ? Session Check In - 03/27/21 1330   ? ?  ? Check-In  ? Supervising physician immediately available to respond to emergencies [facility] MD immediately available   ? Physician(s) Dr.Sheikh   ? Location [facility]   ? Staff Present Rodney Langton, RN;Other;Kaylee Rosana Hoes, MS, ACSM-CEP, Exercise Physiologist   ? Virtual Visit No   ? Medication changes reported     No   ? Fall or balance concerns reported    No   ? Tobacco Cessation No Change   ? Warm-up and Cool-down Performed as group-led instruction   ? Resistance Training Performed Yes   ? VAD Patient? No   ? PAD/SET Patient? No   ?  ? Pain Assessment  ? Currently in Pain? No/denies   ? ?  ?  ? ?  ? ? ?Capillary Blood Glucose: ?No results found. However, due to the size of the patient record, not all encounters were searched. Please check Results Review for a complete set of results. ? ? ? ?Social History  ? ?Tobacco Use  ?Smoking Status Former  ? Packs/day: 2.00  ? Years: 15.00  ? Pack years: 30.00  ? Types: Cigarettes  ? Quit date: 01/15/1980  ? Years since quitting: 41.2  ?Smokeless Tobacco Never  ? ? ?Goals Met:  ?Exercise tolerated well ?No report of concerns or symptoms today ?Strength training completed today ? ?Goals Unmet:  ?Not Applicable ? ?Comments: Service time is from 1326 to 1440 ? ? ? ?Dr. Rodman Pickle is Medical Director for Pulmonary Rehab at [facility].  ?

## 2021-03-29 ENCOUNTER — Encounter (HOSPITAL_COMMUNITY)
Admission: RE | Admit: 2021-03-29 | Discharge: 2021-03-29 | Disposition: A | Discharge status: Home / Self Care | Payer: Medicare Other | Source: Ambulatory Visit | Attending: Internal Medicine | Admitting: Internal Medicine

## 2021-03-29 ENCOUNTER — Other Ambulatory Visit: Payer: Self-pay

## 2021-03-29 DIAGNOSIS — J84112 Idiopathic pulmonary fibrosis: Secondary | ICD-10-CM | POA: Diagnosis not present

## 2021-03-29 NOTE — Progress Notes (Signed)
Daily Session Note ? ?Patient Details  ?Name: [name] ?MRN: [id number] ?Date of Birth: [birth year] ?Referring Provider:   ?Flowsheet Row Pulmonary Rehab Walk Test from 02/28/2021 in [place]  ?Referring Provider Ramaswamy  ? ?  ? ? ?Encounter Date: 03/29/2021 ? ?Check In: ? Session Check In - 03/29/21 1339   ? ?  ? Check-In  ? Supervising physician immediately available to respond to emergencies [facility] immediately available   ? Location [facility]   ? Staff Present Rosebud Poles, RN, Milus Glazier, MS, ACSM-CEP, CCRP, Exercise Physiologist;Kaylee Rosana Hoes, MS, ACSM-CEP, Exercise Physiologist;Other   ? Virtual Visit No   ? Medication changes reported     No   ? Fall or balance concerns reported    No   ? Warm-up and Cool-down Performed as group-led instruction   ? Resistance Training Performed Yes   ? VAD Patient? No   ? PAD/SET Patient? No   ?  ? Pain Assessment  ? Currently in Pain? No/denies   ? Multiple Pain Sites No   ? ?  ?  ? ?  ? ? ?Capillary Blood Glucose: ?No results found. However, due to the size of the patient record, not all encounters were searched. Please check Results Review for a complete set of results. ? ? ? ?Social History  ? ?Tobacco Use  ?Smoking Status Former  ? Packs/day: 2.00  ? Years: 15.00  ? Pack years: 30.00  ? Types: Cigarettes  ? Quit date: 01/15/1980  ? Years since quitting: 41.2  ?Smokeless Tobacco Never  ? ? ?Goals Met:  ?Independence with exercise equipment ?Exercise tolerated well ?No report of concerns or symptoms today ?Strength training completed today ? ?Goals Unmet:  ?Not Applicable ? ?Comments: Service time is from 1317 to 1445 ?.  ? ? ?Dr. Rodman Pickle is Medical Director for Pulmonary Rehab at [facility].  ?

## 2021-04-03 ENCOUNTER — Encounter (HOSPITAL_COMMUNITY)
Admission: RE | Admit: 2021-04-03 | Discharge: 2021-04-03 | Disposition: A | Discharge status: Home / Self Care | Payer: Medicare Other | Source: Ambulatory Visit | Attending: Internal Medicine | Admitting: Internal Medicine

## 2021-04-03 ENCOUNTER — Other Ambulatory Visit: Payer: Self-pay

## 2021-04-03 VITALS — Wt 195.3 lb

## 2021-04-03 DIAGNOSIS — J84112 Idiopathic pulmonary fibrosis: Secondary | ICD-10-CM

## 2021-04-03 NOTE — Progress Notes (Signed)
Daily Session Note ? ?Patient Details  ?Name: [name] ?MRN: [id number] ?Date of Birth: 06-Jan-1941 ?Referring Provider:   ?Flowsheet Row Pulmonary Rehab Walk Test from 02/28/2021 in [place]  ?Referring Provider Ramaswamy  ? ?  ? ? ?Encounter Date: 04/03/2021 ? ?Check In: ? Session Check In - 04/03/21 1428   ? ?  ? Check-In  ? Supervising physician immediately available to respond to emergencies [facility] immediately available   ? Physician(s) Dr. Cruzita Lederer   ? Location [facility]   ? Staff Present Rosebud Poles, RN, Quentin Ore, MS, ACSM-CEP, Exercise Physiologist;Carlette Wilber Oliphant, RN, Deland Pretty, MS, ACSM CEP, Exercise Physiologist   ? Virtual Visit No   ? Medication changes reported     No   ? Fall or balance concerns reported    No   ? Tobacco Cessation No Change   ? Warm-up and Cool-down Performed as group-led instruction   ? Resistance Training Performed Yes   ? VAD Patient? No   ? PAD/SET Patient? No   ?  ? Pain Assessment  ? Currently in Pain? No/denies   ? Multiple Pain Sites No   ? ?  ?  ? ?  ? ? ?Capillary Blood Glucose: ?No results found. However, due to the size of the patient record, not all encounters were searched. Please check Results Review for a complete set of results. ? ? Exercise Prescription Changes - 04/03/21 1500   ? ?  ? Response to Exercise  ? Blood Pressure (Admit) 100/60   ? Blood Pressure (Exercise) 100/62   ? Blood Pressure (Exit) 110/62   ? Heart Rate (Admit) 70 bpm   ? Heart Rate (Exercise) 70 bpm   ? Heart Rate (Exit) 74 bpm   ? Oxygen Saturation (Admit) 98 %   ? Oxygen Saturation (Exercise) 95 %   ? Oxygen Saturation (Exit) 96 %   ? Rating of Perceived Exertion (Exercise) 15   ? Perceived Dyspnea (Exercise) 3   ? Duration Continue with 30 min of aerobic exercise without signs/symptoms of physical distress.   ? Intensity THRR unchanged   ?  ? Progression  ? Progression Continue to progress workloads to maintain  intensity without signs/symptoms of physical distress.   ?  ? Resistance Training  ? Training Prescription Yes   ? Weight blue bands   ? Reps 10-15   ? Time 10 Minutes   ?  ? NuStep  ? Level 3   ? SPM 80   ? Minutes 15   ? METs 2.1   ?  ? Track  ? Laps 18   ? Minutes 15   ? METs 3.09   ? ?  ?  ? ?  ? ? ?Social History  ? ?Tobacco Use  ?Smoking Status Former  ? Packs/day: 2.00  ? Years: 15.00  ? Pack years: 30.00  ? Types: Cigarettes  ? Quit date: 01/15/1980  ? Years since quitting: 41.2  ?Smokeless Tobacco Never  ? ? ?Goals Met:  ?Proper associated with RPD/PD & O2 Sat ?Exercise tolerated well ?No report of concerns or symptoms today ?Strength training completed today ? ?Goals Unmet:  ?Not Applicable ? ?Comments: Service time is from 1318 to 1435. ? ? ? ?Dr. Rodman Pickle is Medical Director for Pulmonary Rehab at [facility].  ?

## 2021-04-05 ENCOUNTER — Other Ambulatory Visit: Payer: Self-pay

## 2021-04-05 ENCOUNTER — Encounter (HOSPITAL_COMMUNITY)
Admission: RE | Admit: 2021-04-05 | Discharge: 2021-04-05 | Disposition: A | Discharge status: Home / Self Care | Payer: Medicare Other | Source: Ambulatory Visit | Attending: Internal Medicine | Admitting: Internal Medicine

## 2021-04-05 DIAGNOSIS — J84112 Idiopathic pulmonary fibrosis: Secondary | ICD-10-CM | POA: Diagnosis not present

## 2021-04-05 NOTE — Progress Notes (Signed)
Home Exercise Prescription ?I have reviewed a Home Exercise Prescription with [name] is currenty exercising at home. He walks 4 non-rehab days/wk for 15 min/day. I encouraged him to increase his time to 30 min/day because he is well conitioned. He agreed with my recommendations. I am confident in [name] carrying out an exercise regimen at home. He is motivated to exercise and improve his functional capacity. The patient stated that their goals were to maintain his current health status. We reviewed exercise guidelines, target heart rate during exercise, RPE Scale, weather conditions, endpoints for exercise, warmup and cool down. The patient is encouraged to come to me with any questions. I will continue to follow up with the patient to assist them with progression and safety.   ? ?Sheppard Plumber, MS, ACSM-CEP ?04/05/2021 ?3:35 PM  ?

## 2021-04-05 NOTE — Progress Notes (Signed)
Daily Session Note ? ?Patient Details  ?Name: [name] ?MRN: [id number] ?Date of Birth: [birth year] ?Referring Provider:   ?Flowsheet Row Pulmonary Rehab Walk Test from 02/28/2021 in [place]  ?Referring Provider Ramaswamy  ? ?  ? ? ?Encounter Date: 04/05/2021 ? ?Check In: ? Session Check In - 04/05/21 1424   ? ?  ? Check-In  ? Supervising physician immediately available to respond to emergencies [facility] immediately available   ? Physician(s) Dr. Pietro Cassis   ? Location [facility]   ? Staff Present Rosebud Poles, RN, Quentin Ore, MS, ACSM-CEP, Exercise Physiologist;Jetta Gilford Rile BS, ACSM EP-C, Exercise Physiologist;Carlette Wilber Oliphant, RN, BSN   ? Virtual Visit No   ? Medication changes reported     No   ? Fall or balance concerns reported    No   ? Tobacco Cessation No Change   ? Warm-up and Cool-down Performed as group-led instruction   ? Resistance Training Performed Yes   ? VAD Patient? No   ? PAD/SET Patient? No   ?  ? Pain Assessment  ? Currently in Pain? No/denies   ? Multiple Pain Sites No   ? ?  ?  ? ?  ? ? ?Capillary Blood Glucose: ?No results found. However, due to the size of the patient record, not all encounters were searched. Please check Results Review for a complete set of results. ? ? ? ?Social History  ? ?Tobacco Use  ?Smoking Status Former  ? Packs/day: 2.00  ? Years: 15.00  ? Pack years: 30.00  ? Types: Cigarettes  ? Quit date: 01/15/1980  ? Years since quitting: 41.2  ?Smokeless Tobacco Never  ? ? ?Goals Met:  ?Proper associated with RPD/PD & O2 Sat ?Exercise tolerated well ?No report of concerns or symptoms today ?Strength training completed today ? ?Goals Unmet:  ?Not Applicable ? ?Comments: Service time is from 1312 to 1439.  ? ? ?Dr. Rodman Pickle is Medical Director for Pulmonary Rehab at [facility].  ?

## 2021-04-06 ENCOUNTER — Ambulatory Visit (INDEPENDENT_AMBULATORY_CARE_PROVIDER_SITE_OTHER): Payer: Medicare Other | Admitting: Adult Health

## 2021-04-06 ENCOUNTER — Encounter: Payer: Self-pay | Admitting: Adult Health

## 2021-04-06 VITALS — BP 98/60 | HR 70 | Temp 97.9°F | Ht 70.0 in | Wt 197.2 lb

## 2021-04-06 DIAGNOSIS — I5022 Chronic systolic (congestive) heart failure: Secondary | ICD-10-CM

## 2021-04-06 DIAGNOSIS — I255 Ischemic cardiomyopathy: Secondary | ICD-10-CM

## 2021-04-06 DIAGNOSIS — J449 Chronic obstructive pulmonary disease, unspecified: Secondary | ICD-10-CM

## 2021-04-06 DIAGNOSIS — G4733 Obstructive sleep apnea (adult) (pediatric): Secondary | ICD-10-CM | POA: Diagnosis not present

## 2021-04-06 DIAGNOSIS — Z5181 Encounter for therapeutic drug level monitoring: Secondary | ICD-10-CM

## 2021-04-06 DIAGNOSIS — R5381 Other malaise: Secondary | ICD-10-CM | POA: Diagnosis not present

## 2021-04-06 DIAGNOSIS — J84112 Idiopathic pulmonary fibrosis: Secondary | ICD-10-CM | POA: Diagnosis not present

## 2021-04-06 DIAGNOSIS — K219 Gastro-esophageal reflux disease without esophagitis: Secondary | ICD-10-CM | POA: Diagnosis not present

## 2021-04-06 MED ORDER — BREZTRI AEROSPHERE 160-9-4.8 MCG/ACT IN AERO: 2.0000 | INHALATION_SPRAY | Freq: Two times a day (BID) | RESPIRATORY_TRACT | 6 refills | Status: DC

## 2021-04-06 MED ORDER — LEVALBUTEROL TARTRATE 45 MCG/ACT IN AERO
2.0000 | INHALATION_SPRAY | RESPIRATORY_TRACT | 4 refills | Status: DC | PRN
Start: 2021-04-06 — End: 2021-12-17

## 2021-04-06 NOTE — Assessment & Plan Note (Signed)
Patient is continue with pulmonary rehab. ?

## 2021-04-06 NOTE — Progress Notes (Signed)
? ?_0  ID: [name], male    DOB: 06-07-1940, 81 y.o.   MRN: [id number] ? ?Chief Complaint  ?Patient presents with  ? Follow-up  ? ? ?Referring provider: ?[name], MD ? ?HPI: ?81 year old male followed for COPD and idiopathic Pulmonary fibrosis ? ?TEST/EVENTS :  ?09/22/2020 High-resolution CT chest compatible with interstitial lung disease probable UIP, mildly progressive compared to previous study. ? ?04/06/2021 Follow up : IPF and COPD  ?Patient returns for 1 month follow-up.  Patient has underlying COPD and pulmonary fibrosis.  Patient says overall breathing is doing okay feels he is managing better. Gets winded with heavy activity.  ?He remains on decreased dose of Esbriet with 2 pills 3 times daily. Had GI issues /weight loss. Improved with lower dose and diet modifications.  ?Patient says overall stomach symptoms are manageable. Currently taking prilosec and ginger root. Taking miralax, diarrhea is not as bad. GERD is better on PPI.  ?Weight has been over the last over last 2 months .  ?He remains on Breztri twice daily. No flare of cough or wheezing . Uses xopenex most days . Says he is using sunscreen when he is outside.  ? ?Is followed by cardiology for cardiomyopathy remains on [place], Jardiance, Mexitil, and INSPRA he also has A-fib and is on Xarelto.  ? ?He was seen by pulmonary at [facility] Dr. Riki Rusk March 14, 2021 care everywhere notes revealed patient was considered to have ILD with probable UIP.  Felt to be underlying IPF.  Also is concern for possible amiodarone toxicity.  Recommend to continue on current regimen and aggressive GERD control.  Did not feel that immunosuppression was indicated. Says he really felt better after meeting Dr. Randol Kern.  ? ?Has started pulmonary rehab, feels this is helping with is stamina.  ? ?Feels he is doing some better and is starting to handle the Fibrosis better.  ? ?LFT last month were normal .  ? ?History of OSA , CPAP  intolerant . Does not want to retest or try CPAP again.  ? ? ?Allergies  ?Allergen Reactions  ? Sulfa Antibiotics Rash  ? ? ?Immunization History  ?Administered Date(s) Administered  ? Fluad Quad(high Dose 65+) 09/23/2018, 10/05/2020  ? Influenza Split 11/11/2010, 11/02/2012, 10/14/2013  ? Influenza Whole 09/27/2011, 11/13/2011  ? Influenza, High Dose Seasonal PF 09/26/2015, 10/07/2016  ? Influenza, Seasonal, Injecte, Preservative Fre 09/23/2018  ? Influenza,inj,Quad PF,6+ Mos 11/03/2016, 09/20/2017  ? Influenza-Unspecified 09/26/2014, 10/15/2019  ? PFIZER Comirnaty(Gray Top)Covid-19 Tri-Sucrose Vaccine 04/24/2020  ? PFIZER(Purple Top)SARS-COV-2 Vaccination 02/02/2019, 02/22/2019, 10/26/2019, 09/29/2020  ? Pneumococcal Conjugate-13 06/15/2014  ? Pneumococcal Polysaccharide-23 03/15/2002, 07/10/2015  ? Td 08/27/2017  ? Tdap 04/04/2010  ? Zoster Recombinat (Shingrix) 03/29/2016  ? Zoster, Live 05/15/2015  ? ? ?Past Medical History:  ?Diagnosis Date  ? Adenomatous colon polyp 2009; 06/2012  ? Colonoscopy 07/2007---Dr. Barron Schmid GI--Repeat 06/2012 showed one tubular adenoma w/out high grade dysplasia.  ? AICD (automatic cardioverter/defibrillator) present   ? [place]  ? Atrial fibrillation ([place])   ? Bilateral sensorineural hearing loss   ? BPH (benign prostatic hypertrophy)   ? PSAs ok (followed by urologist): no LUTS  ? CAD (coronary artery disease)   ? PTCA RCA 1997 (Inf/post MI);  ? CHF (congestive heart failure) ([place])   ? Chronic constipation   ? Chronic rhinitis   ? ?vasomotor (Dr. Erik Obey)  ? COPD (chronic obstructive pulmonary disease) ([place])   ? Diverticulosis of colon 2005  ? Noted on Colonoscopies  in 2005 and 2009 and on CT 2010  ? Gallstones 06/2014  ? No evidence of cholecystitis on abd u/s 06/2014  ? GERD (gastroesophageal reflux disease)   ? Hepatic steatosis 2010; 2016  ? Noted on CT abd 2010 and on u/s abd 04/2009 (transaminasemia) and u/s 06/2014.  ? Hyperlipidemia   ? Hypertension   ? Ischemic  cardiomyopathy   ? Microscopic hematuria   ? Cysto neg 02/2009 except for BPH-likely has microhem from his renal stone dz  ? Nephrolithiasis   ? No distal stones on CT 2010; stable 8 mm RLP stone, 81m LMP stone 10/2013 plain film.  Abd u/s 06/2014: 9 mm nonobstructing lower pole right renal stone.  03/2015 urol: stable nonobstructing renal calc by CT 12/2014 and KUB 03/2015.  ? Obstructive sleep apnea   ? no cpap uses since weight loss of 50 pounds  ? Presence of permanent cardiac pacemaker   ? [place] ? Squamous cell carcinoma of back   ? Ventricular tachycardia   ? ? ?Tobacco History: ?Social History  ? ?Tobacco Use  ?Smoking Status Former  ? Packs/day: 2.00  ? Years: 15.00  ? Pack years: 30.00  ? Types: Cigarettes  ? Quit date: 01/15/1980  ? Years since quitting: 41.2  ?Smokeless Tobacco Never  ? ?Counseling given: Not Answered ? ? ?Outpatient Medications Prior to Visit  ?Medication Sig Dispense Refill  ? antiseptic oral rinse (BIOTENE) LIQD 15 mLs by Mouth Rinse route in the morning and at bedtime.    ? Budeson-Glycopyrrol-Formoterol (BREZTRI AEROSPHERE) 160-9-4.8 MCG/ACT AERO INHALE 2 PUFFS INTO THE LUNGS IN THE MORNING AND AT BEDTIME 10.7 g 0  ? carvedilol (COREG) 3.125 MG tablet Take 1 tablet (3.125 mg total) by mouth 2 (two) times daily with a meal. 180 tablet 3  ? cholecalciferol (VITAMIN D) 1000 units tablet Take 1,000 Units by mouth every evening.    ? empagliflozin (JARDIANCE) 10 MG TABS tablet Take 1 tablet (10 mg total) by mouth daily before breakfast. 90 tablet 3  ? ENTRESTO 24-26 MG TAKE 1 TABLET BY MOUTH TWICE DAILY 180 tablet 3  ? eplerenone (INSPRA) 25 MG tablet Take 1 tablet (25 mg total) by mouth daily. 30 tablet 11  ? furosemide (LASIX) 40 MG tablet TAKE 2 TABLETS BY MOUTH EVERY DAY 90 tablet 3  ? Ginger, Zingiber officinalis, (GINGER ROOT) 550 MG CAPS Take by mouth.    ? ipratropium (ATROVENT) 0.06 % nasal spray Place 2 sprays into both nostrils 3 (three) times daily. 15 mL 1  ?  levalbuterol (XOPENEX HFA) 45 MCG/ACT inhaler Inhale 2 puffs into the lungs every 4 (four) hours as needed for wheezing. 1 each 12  ? loratadine (CLARITIN) 10 MG tablet Take 10 mg by mouth in the morning.    ? Magnesium 250 MG TABS Take 250 mg by mouth every evening.    ? mexiletine (MEXITIL) 150 MG capsule TAKE 2 CAPSULES(300 MG) BY MOUTH TWICE DAILY 360 capsule 2  ? Multiple Vitamin (MULTIVITAMIN WITH MINERALS) TABS Take 1 tablet by mouth every evening.    ? nitroGLYCERIN (NITROSTAT) 0.4 MG SL tablet DISSOLVE 1 TABLET UNDER THE TONGUE EVERY 5 MINUTES AS NEEDED FOR CHEST PAIN (Patient taking differently: Place 0.4 mg under the tongue every 5 (five) minutes x 3 doses as needed for chest pain.) 25 tablet 5  ? Omeprazole 20 MG TBEC SMARTSIG:1 By Mouth    ? Pirfenidone (ESBRIET) 267 MG TABS Take 2 tablets (534 mg total) by mouth  with breakfast, with lunch, and with evening meal. 180 tablet 5  ? polyvinyl alcohol-povidone (HYPOTEARS) 1.4-0.6 % ophthalmic solution Place 1-2 drops into both eyes in the morning and at bedtime.    ? potassium chloride SA (KLOR-CON M) 20 MEQ tablet Take 1 tablet (20 mEq total) by mouth daily with lunch. 90 tablet 3  ? rosuvastatin (CRESTOR) 40 MG tablet TAKE 1 TABLET(40 MG) BY MOUTH EVERY EVENING 90 tablet 0  ? Simethicone (GAS-X PO) Take by mouth.    ? XARELTO 20 MG TABS tablet TAKE 1 TABLET(20 MG) BY MOUTH DAILY WITH SUPPER 90 tablet 1  ? ?No facility-administered medications prior to visit.  ? ? ? ?Review of Systems:  ? ?Constitutional:   No  weight loss, night sweats,  Fevers, chills,  ?+fatigue, or  lassitude. ? ?HEENT:   No headaches,  Difficulty swallowing,  Tooth/dental problems, or  Sore throat,  ?              No sneezing, itching, ear ache, nasal congestion, post nasal drip,  ? ?CV:  No chest pain,  Orthopnea, PND, swelling in lower extremities, anasarca, dizziness, palpitations, syncope.  ? ?GI  No heartburn, indigestion, abdominal pain, nausea, vomiting, diarrhea, change in  bowel habits, loss of appetite, bloody stools.  ? ?Resp:.  No chest wall deformity ? ?Skin: no rash or lesions. ? ?GU: no dysuria, change in color of urine, no urgency or frequency.  No flank pain, no hematuria  ? ?MS:  No jo

## 2021-04-06 NOTE — Assessment & Plan Note (Signed)
Previously severe complex sleep apnea with AHI at 61.  CPAP intolerant.  I discussed retesting and rechallenge however patient declines at this time. ?

## 2021-04-06 NOTE — Patient Instructions (Addendum)
Continue on BREZTRI 2 puffs Twice daily, rinse after use.  ?Xopenex inhaler As needed   ?Continue on Esbriet 2 capsules Three times a day  .  ?Continue on High protein diet.  ?Labs today .  ?Continue with Pulmonary rehab.  ?Follow up in 4 weeks with Dr. Chase Caller as planned and As needed  ?Please contact office for sooner follow up if symptoms do not improve or worsen or seek emergency care  ? ?

## 2021-04-06 NOTE — Assessment & Plan Note (Signed)
Appears euvolemic on exam-continue on current regimen and follow-up with cardiology ?

## 2021-04-06 NOTE — Assessment & Plan Note (Signed)
ILD with probable UIP/IPF.-Patient appears to be clinically stable.  Is tolerating lower dose of Esbriet.  Recent evaluation at [facility] with Dr. Randol Kern.  Care everywhere notes were reviewed.  Patient was recommended continue current course.  Continue to control for acid reflux. ?We will check LFTs today. ?Continue with pulmonary rehab. ? ?Plan  ?Patient Instructions  ?Continue on BREZTRI 2 puffs Twice daily, rinse after use.  ?Xopenex inhaler As needed   ?Continue on Esbriet 2 capsules Three times a day  .  ?Continue on High protein diet.  ?Labs today .  ?Continue with Pulmonary rehab.  ?Follow up in 4 weeks with Dr. Chase Caller as planned and As needed  ?Please contact office for sooner follow up if symptoms do not improve or worsen or seek emergency care  ? ?  ? ? ?

## 2021-04-06 NOTE — Assessment & Plan Note (Signed)
COPD currently compensated on present regimen ? ?Plan  ?Patient Instructions  ?Continue on BREZTRI 2 puffs Twice daily, rinse after use.  ?Xopenex inhaler As needed   ?Continue on Esbriet 2 capsules Three times a day  .  ?Continue on High protein diet.  ?Labs today .  ?Continue with Pulmonary rehab.  ?Follow up in 4 weeks with Dr. Chase [name] as planned and As needed  ?Please contact office for sooner follow up if symptoms do not improve or worsen or seek emergency care  ? ?  ? ?

## 2021-04-06 NOTE — Assessment & Plan Note (Signed)
Continue on Prilosec. ?

## 2021-04-07 ENCOUNTER — Other Ambulatory Visit: Payer: Self-pay | Admitting: Cardiology

## 2021-04-07 DIAGNOSIS — I429 Cardiomyopathy, unspecified: Secondary | ICD-10-CM

## 2021-04-07 DIAGNOSIS — Z95810 Presence of automatic (implantable) cardiac defibrillator: Secondary | ICD-10-CM

## 2021-04-07 DIAGNOSIS — I480 Paroxysmal atrial fibrillation: Secondary | ICD-10-CM

## 2021-04-07 DIAGNOSIS — I059 Rheumatic mitral valve disease, unspecified: Secondary | ICD-10-CM

## 2021-04-07 DIAGNOSIS — I4891 Unspecified atrial fibrillation: Secondary | ICD-10-CM

## 2021-04-07 LAB — COMPREHENSIVE METABOLIC PANEL
AG Ratio: 1.6 (calc) (ref 1.0–2.5)
ALT: 22 U/L (ref 9–46)
AST: 24 U/L (ref 10–35)
Albumin: 4.1 g/dL (ref 3.6–5.1)
Alkaline phosphatase (APISO): 54 U/L (ref 35–144)
BUN: 15 mg/dL (ref 7–25)
CO2: 27 mmol/L (ref 20–32)
Calcium: 9.1 mg/dL (ref 8.6–10.3)
Chloride: 103 mmol/L (ref 98–110)
Creat: 0.94 mg/dL (ref 0.70–1.28)
Globulin: 2.5 g/dL (calc) (ref 1.9–3.7)
Glucose, Bld: 95 mg/dL (ref 65–99)
Potassium: 4 mmol/L (ref 3.5–5.3)
Sodium: 139 mmol/L (ref 135–146)
Total Bilirubin: 0.7 mg/dL (ref 0.2–1.2)
Total Protein: 6.6 g/dL (ref 6.1–8.1)

## 2021-04-09 ENCOUNTER — Other Ambulatory Visit: Payer: Self-pay | Admitting: Cardiology

## 2021-04-09 NOTE — Telephone Encounter (Signed)
Prescription refill request for Xarelto received.  ?Indication:Afib ?Last office visit:12/22 ?Weight:89.4 kg ?Age:81 ?Scr:0.9 ?CrCl:84.16 ml/min ? ?Prescription refilled ? ?

## 2021-04-10 ENCOUNTER — Encounter (HOSPITAL_COMMUNITY)
Admission: RE | Admit: 2021-04-10 | Discharge: 2021-04-10 | Disposition: A | Discharge status: Home / Self Care | Payer: Medicare Other | Source: Ambulatory Visit | Attending: Internal Medicine | Admitting: Internal Medicine

## 2021-04-10 ENCOUNTER — Other Ambulatory Visit: Payer: Self-pay

## 2021-04-10 DIAGNOSIS — J84112 Idiopathic pulmonary fibrosis: Secondary | ICD-10-CM | POA: Diagnosis not present

## 2021-04-10 NOTE — Progress Notes (Signed)
Daily Session Note ? ?Patient Details  ?Name: [name] ?MRN: [id number] ?Date of Birth: 06/11/1940 ?Referring Provider:   ?Flowsheet Row Pulmonary Rehab Walk Test from 02/28/2021 in [place]  ?Referring Provider Ramaswamy  ? ?  ? ? ?Encounter Date: 04/10/2021 ? ?Check In: ? Session Check In - 04/10/21 1512   ? ?  ? Check-In  ? Supervising physician immediately available to respond to emergencies [facility] immediately available   ? Physician(s) Dr. Pietro Cassis   ? Location [facility]   ? Staff Present Rosebud Poles, RN, Quentin Ore, MS, ACSM-CEP, Exercise Physiologist;Lisa Ysidro Evert, RN   ? Virtual Visit No   ? Medication changes reported     No   ? Fall or balance concerns reported    No   ? Tobacco Cessation No Change   ? Warm-up and Cool-down Performed as group-led instruction   ? Resistance Training Performed Yes   ? VAD Patient? No   ? PAD/SET Patient? No   ?  ? Pain Assessment  ? Currently in Pain? No/denies   ? Multiple Pain Sites No   ? ?  ?  ? ?  ? ? ?Capillary Blood Glucose: ?No results found. However, due to the size of the patient record, not all encounters were searched. Please check Results Review for a complete set of results. ? ? ? ?Social History  ? ?Tobacco Use  ?Smoking Status Former  ? Packs/day: 2.00  ? Years: 15.00  ? Pack years: 30.00  ? Types: Cigarettes  ? Quit date: 01/15/1980  ? Years since quitting: 41.2  ?Smokeless Tobacco Never  ? ? ?Goals Met:  ?Proper associated with RPD/PD & O2 Sat ?Exercise tolerated well ?No report of concerns or symptoms today ?Strength training completed today ? ?Goals Unmet:  ?Not Applicable ? ?Comments: Service time is from 1320 to 1435.  ? ? ?Dr. Rodman Pickle is Medical Director for Pulmonary Rehab at [facility].  ?

## 2021-04-11 ENCOUNTER — Other Ambulatory Visit (HOSPITAL_COMMUNITY): Payer: Self-pay

## 2021-04-11 NOTE — Progress Notes (Signed)
Pulmonary Individual Treatment Plan ? ?Patient Details  ?Name: [name] ?MRN: [id number] ?Date of Birth: 06-18-1940 ?Referring Provider:   ?Flowsheet Row Pulmonary Rehab Walk Test from 02/28/2021 in [facility]  ?Referring Provider Ramaswamy  ? ?  ? ? ?Initial Encounter Date:  ?Flowsheet Row Pulmonary Rehab Walk Test from 02/28/2021 in [facility]  ?Date 02/28/21  ? ?  ? ? ?Visit Diagnosis: Idiopathic pulmonary fibrosis ([place]) ? ?Patient's Home Medications on Admission:  ? ?Current Outpatient Medications:  ?  antiseptic oral rinse (BIOTENE) LIQD, 15 mLs by Mouth Rinse route in the morning and at bedtime., Disp: , Rfl:  ?  Budeson-Glycopyrrol-Formoterol (BREZTRI AEROSPHERE) 160-9-4.8 MCG/ACT AERO, INHALE 2 PUFFS INTO THE LUNGS IN THE MORNING AND AT BEDTIME, Disp: 10.7 g, Rfl: 0 ?  Budeson-Glycopyrrol-Formoterol (BREZTRI AEROSPHERE) 160-9-4.8 MCG/ACT AERO, Inhale 2 puffs into the lungs in the morning and at bedtime., Disp: 10.7 g, Rfl: 6 ?  carvedilol (COREG) 3.125 MG tablet, Take 1 tablet (3.125 mg total) by mouth 2 (two) times daily with a meal., Disp: 180 tablet, Rfl: 3 ?  cholecalciferol (VITAMIN D) 1000 units tablet, Take 1,000 Units by mouth every evening., Disp: , Rfl:  ?  empagliflozin (JARDIANCE) 10 MG TABS tablet, Take 1 tablet (10 mg total) by mouth daily before breakfast., Disp: 90 tablet, Rfl: 3 ?  ENTRESTO 24-26 MG, TAKE 1 TABLET BY MOUTH TWICE DAILY, Disp: 180 tablet, Rfl: 3 ?  eplerenone (INSPRA) 25 MG tablet, TAKE 1 TABLET(25 MG) BY MOUTH DAILY, Disp: 30 tablet, Rfl: 11 ?  furosemide (LASIX) 40 MG tablet, TAKE 2 TABLETS BY MOUTH EVERY DAY, Disp: 90 tablet, Rfl: 3 ?  Ginger, Zingiber officinalis, (GINGER ROOT) 550 MG CAPS, Take by mouth., Disp: , Rfl:  ?  ipratropium (ATROVENT) 0.06 % nasal spray, Place 2 sprays into both nostrils 3 (three) times daily., Disp: 15 mL, Rfl: 1 ?  levalbuterol (XOPENEX HFA) 45 MCG/ACT inhaler, Inhale 2 puffs into  the lungs every 4 (four) hours as needed for wheezing., Disp: 1 each, Rfl: 12 ?  levalbuterol (XOPENEX HFA) 45 MCG/ACT inhaler, Inhale 2 puffs into the lungs every 4 (four) hours as needed for wheezing., Disp: 1 each, Rfl: 4 ?  loratadine (CLARITIN) 10 MG tablet, Take 10 mg by mouth in the morning., Disp: , Rfl:  ?  Magnesium 250 MG TABS, Take 250 mg by mouth every evening., Disp: , Rfl:  ?  mexiletine (MEXITIL) 150 MG capsule, TAKE 2 CAPSULES(300 MG) BY MOUTH TWICE DAILY, Disp: 360 capsule, Rfl: 2 ?  Multiple Vitamin (MULTIVITAMIN WITH MINERALS) TABS, Take 1 tablet by mouth every evening., Disp: , Rfl:  ?  nitroGLYCERIN (NITROSTAT) 0.4 MG SL tablet, DISSOLVE 1 TABLET UNDER THE TONGUE EVERY 5 MINUTES AS NEEDED FOR CHEST PAIN (Patient taking differently: Place 0.4 mg under the tongue every 5 (five) minutes x 3 doses as needed for chest pain.), Disp: 25 tablet, Rfl: 5 ?  Omeprazole 20 MG TBEC, SMARTSIG:1 By Mouth, Disp: , Rfl:  ?  Pirfenidone (ESBRIET) 267 MG TABS, Take 2 tablets (534 mg total) by mouth with breakfast, with lunch, and with evening meal., Disp: 180 tablet, Rfl: 5 ?  polyvinyl alcohol-povidone (HYPOTEARS) 1.4-0.6 % ophthalmic solution, Place 1-2 drops into both eyes in the morning and at bedtime., Disp: , Rfl:  ?  potassium chloride SA (KLOR-CON M) 20 MEQ tablet, Take 1 tablet (20 mEq total) by mouth daily with lunch., Disp: 90 tablet, Rfl:  3 ?  rosuvastatin (CRESTOR) 40 MG tablet, TAKE 1 TABLET(40 MG) BY MOUTH EVERY EVENING, Disp: 90 tablet, Rfl: 0 ?  Simethicone (GAS-X PO), Take by mouth., Disp: , Rfl:  ?  XARELTO 20 MG TABS tablet, TAKE 1 TABLET(20 MG) BY MOUTH DAILY WITH SUPPER, Disp: 90 tablet, Rfl: 1 ? ?Past Medical History: ?Past Medical History:  ?Diagnosis Date  ? Adenomatous colon polyp 2009; 06/2012  ? Colonoscopy 07/2007---Dr. Barron Schmid [facility]--Repeat 06/2012 showed one tubular adenoma w/out high grade dysplasia.  ? AICD (automatic cardioverter/defibrillator) present   ? [place]  ?  Atrial fibrillation ([place])   ? Bilateral sensorineural hearing loss   ? BPH (benign prostatic hypertrophy)   ? PSAs ok (followed by urologist): no LUTS  ? CAD (coronary artery disease)   ? PTCA RCA 1997 (Inf/post MI);  ? CHF (congestive heart failure) ([place])   ? Chronic constipation   ? Chronic rhinitis   ? ?vasomotor (Dr. Erik Obey)  ? COPD (chronic obstructive pulmonary disease) ([place])   ? Diverticulosis of colon 2005  ? Noted on Colonoscopies in 2005 and 2009 and on CT 2010  ? Gallstones 06/2014  ? No evidence of cholecystitis on abd u/s 06/2014  ? GERD (gastroesophageal reflux disease)   ? Hepatic steatosis 2010; 2016  ? Noted on CT abd 2010 and on u/s abd 04/2009 (transaminasemia) and u/s 06/2014.  ? Hyperlipidemia   ? Hypertension   ? Ischemic cardiomyopathy   ? Microscopic hematuria   ? Cysto neg 02/2009 except for BPH-likely has microhem from his renal stone dz  ? Nephrolithiasis   ? No distal stones on CT 2010; stable 8 mm RLP stone, 2m LMP stone 10/2013 plain film.  Abd u/s 06/2014: 9 mm nonobstructing lower pole right renal stone.  03/2015 urol: stable nonobstructing renal calc by CT 12/2014 and KUB 03/2015.  ? Obstructive sleep apnea   ? no cpap uses since weight loss of 50 pounds  ? Presence of permanent cardiac pacemaker   ? [place] ? Squamous cell carcinoma of back   ? Ventricular tachycardia   ? ? ?Tobacco Use: ?Social History  ? ?Tobacco Use  ?Smoking Status Former  ? Packs/day: 2.00  ? Years: 15.00  ? Pack years: 30.00  ? Types: Cigarettes  ? Quit date: 01/15/1980  ? Years since quitting: 41.2  ?Smokeless Tobacco Never  ? ? ?Labs: ?Review Flowsheet   ? ?  ?  Latest Ref Rng & Units 08/09/2019 11/17/2019 05/17/2020 11/16/2020  ?Labs for ITP Cardiac and Pulmonary Rehab  ?Cholestrol 0 - 200 mg/dL 130   127   135   124    ?LDL (calc) 0 - 99 mg/dL 62   70   81   70    ?HDL-C >39.00 mg/dL 30.30   36.30   32.50   34.80    ?Trlycerides 0.0 - 149.0 mg/dL 191.0   102.0   105.0   98.0    ?Hemoglobin A1c 4.6 - 6.5 %       ? ?  11/17/2020  ?Labs for ITP Cardiac and Pulmonary Rehab  ?Cholestrol   ?LDL (calc)   ?HDL-C   ?Trlycerides   ?Hemoglobin A1c 6.0    ?  ? ? Multiple values from one day are sorted in reverse-chronological order  ?  ?  ? ? ?Capillary Blood Glucose: ?Lab Results  ?Component Value Date  ? GLUCAP 101 (H) 09/26/2012  ? GLUCAP 107 (H) 09/26/2012  ? GLUCAP 100 (H) 09/25/2012  ?  GLUCAP 108 (H) 12/10/2009  ? ? ? ?Pulmonary Assessment Scores: ? Pulmonary Assessment Scores   ? ? [place] Name 02/28/21 1153  ?  ?  ?  ? ADL UCSD  ? ADL Phase Entry    ? SOB Score total 42    ?  ? CAT Score  ? CAT Score 29    ?  ? mMRC Score  ? mMRC Score 2    ? ?  ?  ? ?  ? ?UCSD: ?Self-administered rating of dyspnea associated with activities of daily living (ADLs) ?6-point scale (0 = "not at all" to 5 = "maximal or unable to do because of breathlessness")  ?Scoring Scores range from 0 to 120.  Minimally important difference is 5 units ? ?CAT: ?CAT can identify the health impairment of COPD patients and is better correlated with disease progression.  ?CAT has a scoring range of zero to 40. The CAT score is classified into four groups of low (less than 10), medium (10 - 20), high (21-30) and very high (31-40) based on the impact level of disease on health status. A CAT score over 10 suggests significant symptoms.  A worsening CAT score could be explained by an exacerbation, poor medication adherence, poor inhaler technique, or progression of COPD or comorbid conditions.  ?CAT MCID is 2 points ? ?mMRC: ?mMRC (Modified Medical Research Council) Dyspnea Scale is used to assess the degree of baseline functional disability in patients of respiratory disease due to dyspnea. ?No minimal important difference is established. A decrease in score of 1 point or greater is considered a positive change.  ? ?Pulmonary Function Assessment: ? Pulmonary Function Assessment - 02/28/21 1156   ? ?  ? Breath  ? Bilateral Breath Sounds [place]   ? Shortness of Breath  Yes;Limiting activity   ? ?  ?  ? ?  ? ? ?Exercise Target Goals: ?Exercise Program Goal: ?Individual exercise prescription set using results from initial 6 min walk test and THRR while considering  patient?

## 2021-04-12 ENCOUNTER — Encounter (HOSPITAL_COMMUNITY)
Admission: RE | Admit: 2021-04-12 | Discharge: 2021-04-12 | Disposition: A | Discharge status: Home / Self Care | Payer: Medicare Other | Source: Ambulatory Visit | Attending: Internal Medicine | Admitting: Internal Medicine

## 2021-04-12 DIAGNOSIS — J84112 Idiopathic pulmonary fibrosis: Secondary | ICD-10-CM

## 2021-04-12 NOTE — Progress Notes (Signed)
Daily Session Note ? ?Patient Details  ?Name: [name] ?MRN: [id number] ?Date of Birth: [birth year] ?Referring Provider:   ?Flowsheet Row Pulmonary Rehab Walk Test from 02/28/2021 in [facility]  ?Referring Provider Ramaswamy  ? ?  ? ? ?Encounter Date: 04/12/2021 ? ?Check In: ? Session Check In - 04/12/21 1425   ? ?  ? Check-In  ? Supervising physician immediately available to respond to emergencies [facility] immediately available   ? Physician(s) Dr. Maryland Pink   ? Location [facility]   ? Staff Present Elmon Else, MS, ACSM-CEP, Exercise Physiologist;Lisa Ysidro Evert, RN;Other   ? Virtual Visit No   ? Medication changes reported     No   ? Fall or balance concerns reported    No   ? Tobacco Cessation No Change   ? Warm-up and Cool-down Performed as group-led instruction   ? Resistance Training Performed Yes   ? VAD Patient? No   ? PAD/SET Patient? No   ?  ? Pain Assessment  ? Multiple Pain Sites No   ? ?  ?  ? ?  ? ? ?Capillary Blood Glucose: ?No results found. However, due to the size of the patient record, not all encounters were searched. Please check Results Review for a complete set of results. ? ? ? ?Social History  ? ?Tobacco Use  ?Smoking Status Former  ? Packs/day: 2.00  ? Years: 15.00  ? Pack years: 30.00  ? Types: Cigarettes  ? Quit date: 01/15/1980  ? Years since quitting: 41.2  ?Smokeless Tobacco Never  ? ? ?Goals Met:  ?Proper associated with RPD/PD & O2 Sat ?Exercise tolerated well ?No report of concerns or symptoms today ?Strength training completed today ? ?Goals Unmet:  ?Not Applicable ? ?Comments: Service time is from 1319 to 15.  ? ? ?Dr. Rodman Pickle is Medical Director for Pulmonary Rehab at [facility].  ?

## 2021-04-17 ENCOUNTER — Encounter (HOSPITAL_COMMUNITY)
Admission: RE | Admit: 2021-04-17 | Discharge: 2021-04-17 | Disposition: A | Discharge status: Home / Self Care | Payer: Medicare Other | Source: Ambulatory Visit | Attending: Internal Medicine | Admitting: Internal Medicine

## 2021-04-17 VITALS — Wt 191.4 lb

## 2021-04-17 DIAGNOSIS — J84112 Idiopathic pulmonary fibrosis: Secondary | ICD-10-CM | POA: Diagnosis not present

## 2021-04-17 NOTE — Progress Notes (Signed)
Daily Session Note ? ?Patient Details  ?Name: RAFEL GARDE ?MRN: [id number] ?Date of Birth: [birth year] ?Referring Provider:   ?Flowsheet Row Pulmonary Rehab Walk Test from 02/28/2021 in [facility]  ?Referring Provider Ramaswamy  ? ?  ? ? ?Encounter Date: 04/17/2021 ? ?Check In: ? Session Check In - 04/17/21 1504   ? ?  ? Check-In  ? Supervising physician immediately available to respond to emergencies [facility] immediately available   ? Physician(s) Dr. Maryland Pink   ? [facility]   ? Staff Present Elmon Else, MS, ACSM-CEP, Exercise Physiologist;Lisa Ysidro Evert, RN;David Lilyan Punt, MS, ACSM-CEP, CCRP, Exercise Physiologist   ? Virtual Visit No   ? Medication changes reported     No   ? Fall or balance concerns reported    No   ? Tobacco Cessation No Change   ? Warm-up and Cool-down Performed as group-led instruction   ? Resistance Training Performed Yes   ? VAD Patient? No   ? PAD/SET Patient? No   ?  ? Pain Assessment  ? Currently in Pain? No/denies   ? Multiple Pain Sites No   ? ?  ?  ? ?  ? ? ?Capillary Blood Glucose: ?No results found. However, due to the size of the patient record, not all encounters were searched. Please check Results Review for a complete set of results. ? ? Exercise Prescription Changes - 04/17/21 1500   ? ?  ? Response to Exercise  ? Blood Pressure (Admit) 106/64   ? Blood Pressure (Exercise) 110/70   ? Blood Pressure (Exit) 106/60   ? Heart Rate (Admit) 60 bpm   ? Heart Rate (Exercise) 64 bpm   ? Heart Rate (Exit) 62 bpm   ? Oxygen Saturation (Admit) 97 %   ? Oxygen Saturation (Exercise) 95 %   ? Oxygen Saturation (Exit) 95 %   ? Rating of Perceived Exertion (Exercise) 13   ? Perceived Dyspnea (Exercise) 3   ? Duration Continue with 30 min of aerobic exercise without signs/symptoms of physical distress.   ? Intensity THRR unchanged   ?  ? Progression  ? Progression Continue to progress workloads to maintain intensity without  signs/symptoms of physical distress.   ?  ? Resistance Training  ? Training Prescription Yes   ? Weight blue bands   ? Reps 10-15   ? Time 10 Minutes   ?  ? NuStep  ? Level 3   ? SPM 80   ? Minutes 15   ? METs 2.2   ?  ? Track  ? Laps 16   ? Minutes 51   ? METs 2.86   ? ?  ?  ? ?  ? ? ?Social History  ? ?Tobacco Use  ?Smoking Status Former  ? Packs/day: 2.00  ? Years: 15.00  ? Pack years: 30.00  ? Types: Cigarettes  ? Quit date: 01/15/1980  ? Years since quitting: 41.2  ?Smokeless Tobacco Never  ? ? ?Goals Met:  ?Proper associated with RPD/PD & O2 Sat ?Exercise tolerated well ?No report of concerns or symptoms today ?Strength training completed today ? ?Goals Unmet:  ?Not Applicable ? ?Comments: Service time is from 1320 to 53.  ? ? ?Dr. Rodman Pickle is Medical Director for Pulmonary Rehab at [facility].  ?

## 2021-04-19 ENCOUNTER — Encounter (HOSPITAL_COMMUNITY)
Admission: RE | Admit: 2021-04-19 | Discharge: 2021-04-19 | Disposition: A | Discharge status: Home / Self Care | Payer: Medicare Other | Source: Ambulatory Visit | Attending: Internal Medicine | Admitting: Internal Medicine

## 2021-04-19 DIAGNOSIS — J84112 Idiopathic pulmonary fibrosis: Secondary | ICD-10-CM | POA: Diagnosis not present

## 2021-04-19 NOTE — Progress Notes (Signed)
Daily Session Note ? ?Patient Details  ?Name: [name] ?MRN: [id number] ?Date of Birth: [birth year] ?Referring Provider:   ?Flowsheet Row Pulmonary Rehab Walk Test from 02/28/2021 in [facility]  ?Referring Provider Ramaswamy  ? ?  ? ? ?Encounter Date: 04/19/2021 ? ?Check In: ? Session Check In - 04/19/21 1359   ? ?  ? Check-In  ? Supervising physician immediately available to respond to emergencies [facility] immediately available   ? Physician(s) Dr. Pietro Cassis   ? [facility]   ? Staff Present Elmon Else, MS, ACSM-CEP, Exercise Physiologist;Joan Leonia Reeves, RN, Roque Cash, RN   ? Virtual Visit No   ? Medication changes reported     No   ? Fall or balance concerns reported    No   ? Tobacco Cessation No Change   ? Warm-up and Cool-down Performed as group-led instruction   ? Resistance Training Performed Yes   ? VAD Patient? No   ? PAD/SET Patient? No   ?  ? Pain Assessment  ? Currently in Pain? No/denies   ? Multiple Pain Sites No   ? ?  ?  ? ?  ? ? ?Capillary Blood Glucose: ?No results found. However, due to the size of the patient record, not all encounters were searched. Please check Results Review for a complete set of results. ? ? ? ?Social History  ? ?Tobacco Use  ?Smoking Status Former  ? Packs/day: 2.00  ? Years: 15.00  ? Pack years: 30.00  ? Types: Cigarettes  ? Quit date: 01/15/1980  ? Years since quitting: 41.2  ?Smokeless Tobacco Never  ? ? ?Goals Met:  ?Exercise tolerated well ?No report of concerns or symptoms today ?Strength training completed today ? ?Goals Unmet:  ?Not Applicable ? ?Comments: Service time is from 1314 to 1430 ? ? ? ?Dr. Rodman Pickle is Medical Director for Pulmonary Rehab at [facility].  ?

## 2021-04-24 ENCOUNTER — Encounter: Payer: Self-pay | Admitting: Gastroenterology

## 2021-04-24 ENCOUNTER — Encounter (HOSPITAL_COMMUNITY)
Admission: RE | Admit: 2021-04-24 | Discharge: 2021-04-24 | Disposition: A | Discharge status: Home / Self Care | Payer: Medicare Other | Source: Ambulatory Visit | Attending: Internal Medicine | Admitting: Internal Medicine

## 2021-04-24 DIAGNOSIS — J84112 Idiopathic pulmonary fibrosis: Secondary | ICD-10-CM

## 2021-04-24 NOTE — Progress Notes (Signed)
Daily Session Note ? ?Patient Details  ?Name: [name] ?MRN: [id number] ?Date of Birth: [birth year] ?Referring Provider:   ?Flowsheet Row Pulmonary Rehab Walk Test from 02/28/2021 in [place]  ?Referring Provider Ramaswamy  ? ?  ? ? ?Encounter Date: 04/24/2021 ? ?Check In: ? Session Check In - 04/24/21 1437   ? ?  ? Check-In  ? Supervising physician immediately available to respond to emergencies [facility] immediately available   ? Physician(s) Dr. Maryland Pink   ? Location [facility]   ? Staff Present Rodney Langton, RN;Olinty Celesta Aver, MS, ACSM CEP, Exercise Physiologist;Kaylee Rosana Hoes, MS, ACSM-CEP, Exercise Physiologist   ? Virtual Visit No   ? Medication changes reported     No   ? Fall or balance concerns reported    No   ? Tobacco Cessation No Change   ? Warm-up and Cool-down Performed as group-led instruction   ? Resistance Training Performed Yes   ? VAD Patient? No   ? PAD/SET Patient? No   ?  ? Pain Assessment  ? Currently in Pain? No/denies   ? Multiple Pain Sites No   ? ?  ?  ? ?  ? ? ?Capillary Blood Glucose: ?No results found. However, due to the size of the patient record, not all encounters were searched. Please check Results Review for a complete set of results. ? ? ? ?Social History  ? ?Tobacco Use  ?Smoking Status Former  ? Packs/day: 2.00  ? Years: 15.00  ? Pack years: 30.00  ? Types: Cigarettes  ? Quit date: 01/15/1980  ? Years since quitting: 41.3  ?Smokeless Tobacco Never  ? ? ?Goals Met:  ?Exercise tolerated well ?No report of concerns or symptoms today ?Strength training completed today ? ?Goals Unmet:  ?Not Applicable ? ? ?Comments: Service time is from to 1320 to 1439. ? ? ? ?Dr. Rodman Pickle is Medical Director for Pulmonary Rehab at [facility].  ?

## 2021-04-25 ENCOUNTER — Other Ambulatory Visit (HOSPITAL_COMMUNITY): Payer: Self-pay

## 2021-04-25 ENCOUNTER — Ambulatory Visit (INDEPENDENT_AMBULATORY_CARE_PROVIDER_SITE_OTHER): Payer: Medicare Other

## 2021-04-25 DIAGNOSIS — I4821 Permanent atrial fibrillation: Secondary | ICD-10-CM

## 2021-04-25 DIAGNOSIS — E7800 Pure hypercholesterolemia, unspecified: Secondary | ICD-10-CM

## 2021-04-25 DIAGNOSIS — J84112 Idiopathic pulmonary fibrosis: Secondary | ICD-10-CM

## 2021-04-25 DIAGNOSIS — I5022 Chronic systolic (congestive) heart failure: Secondary | ICD-10-CM

## 2021-04-25 DIAGNOSIS — J449 Chronic obstructive pulmonary disease, unspecified: Secondary | ICD-10-CM

## 2021-04-25 DIAGNOSIS — I10 Essential (primary) hypertension: Secondary | ICD-10-CM

## 2021-04-25 NOTE — Patient Instructions (Signed)
Visit Information  ? ?Thank you for taking time to visit with me today. Please don't hesitate to contact me if I can be of assistance to you before our next scheduled telephone appointment. ? ?Following are the goals we discussed today:  ?Take all medications as prescribed ?Attend all scheduled provider appointments ?Call pharmacy for medication refills 3-7 days in advance of running out of medications ?Perform all self care activities independently  ?Call provider office for new concerns or questions  ?call office if I gain more than 2 pounds in one day or 5 pounds in one week ?do ankle pumps when sitting ?track weight in diary ?watch for swelling in feet, ankles and legs every day ?weigh myself daily ?eat more whole grains, fruits and vegetables, lean meats and healthy fats ?identify and remove indoor air pollutants ?limit outdoor activity during cold weather ?listen for public air quality announcements every day ?attend pulmonary rehabilitation ?follow rescue plan if symptoms flare-up ?get at least 7 to 8 hours of sleep at night ?begin a symptom diary ?check pulse (heart) rate once a day ?take medicine as prescribed ?check blood pressure daily ?write blood pressure results in a log or diary ?call doctor for signs and symptoms of high blood pressure ?limit salt intake to 2367m/day ?call for medicine refill 2 or 3 days before it runs out ?take all medications exactly as prescribed ?call doctor with any symptoms you believe are related to your medicine ?Heart Failure, Self-Care ?Heart failure is a serious condition. The following information explains things you need to do to take care of yourself at home. To help you stay as healthy as possible, you may be asked to change your diet, take certain medicines, and make other changes in your life. Your doctor may also give you more specific instructions. If you have problems or questions, call your doctor. ?What are the risks? ?Having heart failure makes it more likely  for you to have some problems. These problems can get worse if you do not take good care of yourself. Problems may include: ?Damage to the kidneys, liver, or lungs. ?Malnutrition. ?Abnormal heart rhythms. ?Blood clotting problems that could cause a stroke. ?Supplies needed: ?Scale for weighing yourself. ?Blood pressure monitor. ?Notebook. ?Medicines. ?How to care for yourself when you have heart failure ?Medicines ?Take over-the-counter and prescription medicines only as told by your doctor. Take your medicines every day. ?Do not stop taking your medicine unless your doctor tells you to do so. ?Do not skip any medicines. ?Get your prescriptions refilled before you run out of medicine. This is important. ?Talk with your doctor if you cannot afford your medicines. ?Eating and drinking ? ?Eat heart-healthy foods. Talk with a diet specialist (dietitian) to create an eating plan. ?Limit salt (sodium) if told by your doctor. Ask your diet specialist to tell you which seasonings are healthy for your heart. ?Cook in healthy ways instead of frying. Healthy ways of cooking include roasting, grilling, broiling, baking, poaching, steaming, and stir-frying. ?Choose foods that: ?Have no trans fat. ?Are low in saturated fat and cholesterol. ?Choose healthy foods, such as: ?Fresh or frozen fruits and vegetables. ?Fish. ?Low-fat (lean) meats. ?Legumes, such as beans, peas, and lentils. ?Fat-free or low-fat dairy products. ?Whole-grain foods. ?High-fiber foods. ?Limit how much fluid you drink, if told by your doctor. ?Alcohol use ?Do not drink alcohol if: ?Your doctor tells you not to drink. ?Your heart was damaged by alcohol, or you have very bad heart failure. ?You are pregnant, may be pregnant,  or are planning to become pregnant. ?If you drink alcohol: ?Limit how much you have to: ?0-1 drink a day for women. ?0-2 drinks a day for men. ?Know how much alcohol is in your drink. In the [place] one drink equals one 12 oz bottle of beer  (355 mL), one 5 oz glass of wine (148 mL), or one 1? oz glass of hard liquor (44 mL). ?Lifestyle ? ?Do not smoke or use any products that contain nicotine or tobacco. If you need help quitting, ask your doctor. ?Do not use nicotine gum or patches before talking to your doctor. ?Do not use illegal drugs. ?Lose weight if told by your doctor. ?Do physical activity if told by your doctor. Talk to your doctor before you begin an exercise if: ?You are an older adult. ?You have very bad heart failure. ?Learn to manage stress. If you need help, ask your doctor. ?Get physical rehab (rehabilitation) to help you stay independent and to help with your quality of life. ?Participate in a cardiac rehab program. This program helps you improve your health through exercise, education, and counseling. ?Plan time to rest when you get tired. ?Check weight and blood pressure ? ?Weigh yourself every day. This will help you to know if fluid is building up in your body. ?Weigh yourself every morning after you pee (urinate) and before you eat breakfast. ?Wear the same amount of clothing each time. ?Write down your daily weight. Give your record to your doctor. ?Check and write down your blood pressure as told by your doctor. ?Check your pulse as told by your doctor. ?Dealing with very hot and very cold weather ?If it is very hot: ?Avoid activities that take a lot of energy. ?Use air conditioning or fans, or find a cooler place. ?Avoid caffeine and alcohol. ?Wear clothing that is loose-fitting, lightweight, and light-colored. ?If it is very cold: ?Avoid activities that take a lot of energy. ?Layer your clothes. ?Wear mittens or gloves, a hat, and a face covering when you go outside. ?Avoid alcohol. ?Follow these instructions at home: ?Stay up to date with shots (vaccines). Get pneumococcal and flu (influenza) shots. ?Keep all follow-up visits. ?Contact a doctor if: ?You gain 2-3 lb (1-1.4 kg) in 24 hours or 5 lb (2.3 kg) in a week. ?You have  increasing shortness of breath. ?You cannot do your normal activities. ?You get tired easily. ?You cough a lot. ?You do not feel like eating or feel like you may vomit (nauseous). ?You have swelling in your hands, feet, ankles, or belly (abdomen). ?You cannot sleep well because it is hard to breathe. ?You feel like your heart is beating fast (palpitations). ?You get dizzy when you stand up. ?You feel depressed or sad. ?Get help right away if: ?You have trouble breathing. ?You or someone else notices a change in your behavior, such as having trouble staying awake. ?You have chest pain or discomfort. ?You pass out (faint). ?These symptoms may be an emergency. Get help right away. Call your local emergency services (911 in the [place] ?Do not wait to see if the symptoms will go away. ?Do not drive yourself to the hospital. ?Summary ?Heart failure is a serious condition. To care for yourself, you may have to change your diet, take medicines, and make other lifestyle changes. ?Take your medicines every day. Do not stop taking them unless your doctor tells you to do so. ?Limit salt and eat heart-healthy foods. ?Ask your doctor if you can drink alcohol. You may have  to stop alcohol use if you have very bad heart failure. ?Contact your doctor if you gain weight quickly or feel that your heart is beating too fast. Get help right away if you pass out or have chest pain or trouble breathing. ?This information is not intended to replace advice given to you by your health care provider. Make sure you discuss any questions you have with your health care provider. ?Document Revised: 07/24/2019 Document Reviewed: 07/24/2019 ?Elsevier Patient Education ? [place]. ? ?Our next appointment is by telephone on 07/04/21 at 3 PM ? ?Please call the care guide team at [phone] if you need to cancel or reschedule your appointment.  ? ?If you are experiencing a Mental Health or [place] or need someone to talk to,  please call the Suicide and Crisis Lifeline: 988 ?call the [place] National Suicide Prevention Lifeline: 262-085-9843 or TTY: (727)226-7114 TTY (216)418-0333) to talk to a trained counselor ?call 1-800-27

## 2021-04-25 NOTE — Chronic Care Management (AMB) (Signed)
?Chronic Care Management  ? ?CCM RN Visit Note ? ?04/25/2021 ?Name: [name] MRN: [id number] DOB: [birth year] ? ?Subjective: ?[name] is a 81 y.o. year old male who is a primary care patient of [name], [name], MD. The care management team was consulted for assistance with disease management and care coordination needs.   ? ?Engaged with patient by telephone for initial visit in response to provider referral for case management and/or care coordination services.  ? ?Consent to Services:  ?The patient was given the following information about Chronic Care Management services today, agreed to services, and gave verbal consent: 1. CCM service includes personalized support from designated clinical staff supervised by the primary care provider, including individualized plan of care and coordination with other care providers 2. 24/7 contact phone numbers for assistance for urgent and routine care needs. 3. Service will only be billed when office clinical staff spend 20 minutes or more in a month to coordinate care. 4. Only one practitioner may furnish and bill the service in a calendar month. 5.The patient may stop CCM services at any time (effective at the end of the month) by phone call to the office staff. 6. The patient will be responsible for cost sharing (co-pay) of up to 20% of the service fee (after annual deductible is met). Patient agreed to services and consent obtained. ? ?Patient agreed to services and verbal consent obtained.  ? ?Assessment: Review of patient past medical history, allergies, medications, health status, including review of consultants reports, laboratory and other test data, was performed as part of comprehensive evaluation and provision of chronic care management services.  ? ?SDOH (Social Determinants of Health) assessments and interventions performed:  ?SDOH Interventions   ? ?Flowsheet Row Most Recent Value  ?SDOH Interventions   ?Food Insecurity Interventions Intervention Not  Indicated  ?Financial Strain Interventions Intervention Not Indicated  ?Housing Interventions Intervention Not Indicated  ?Stress Interventions Intervention Not Indicated  ?Transportation Interventions Intervention Not Indicated  ? ?  ?  ? ?[place] ? ?Allergies  ?Allergen Reactions  ? Sulfa Antibiotics Rash  ? ? ?Outpatient Encounter Medications as of 04/25/2021  ?Medication Sig Note  ? antiseptic oral rinse (BIOTENE) LIQD 15 mLs by Mouth Rinse route in the morning and at bedtime.   ? Budeson-Glycopyrrol-Formoterol (BREZTRI AEROSPHERE) 160-9-4.8 MCG/ACT AERO INHALE 2 PUFFS INTO THE LUNGS IN THE MORNING AND AT BEDTIME   ? Budeson-Glycopyrrol-Formoterol (BREZTRI AEROSPHERE) 160-9-4.8 MCG/ACT AERO Inhale 2 puffs into the lungs in the morning and at bedtime.   ? carvedilol (COREG) 3.125 MG tablet Take 1 tablet (3.125 mg total) by mouth 2 (two) times daily with a meal.   ? cholecalciferol (VITAMIN D) 1000 units tablet Take 1,000 Units by mouth every evening.   ? empagliflozin (JARDIANCE) 10 MG TABS tablet Take 1 tablet (10 mg total) by mouth daily before breakfast.   ? ENTRESTO 24-26 MG TAKE 1 TABLET BY MOUTH TWICE DAILY   ? eplerenone (INSPRA) 25 MG tablet TAKE 1 TABLET(25 MG) BY MOUTH DAILY   ? furosemide (LASIX) 40 MG tablet TAKE 2 TABLETS BY MOUTH EVERY DAY   ? Ginger, Zingiber officinalis, (GINGER ROOT) 550 MG CAPS Take by mouth.   ? ipratropium (ATROVENT) 0.06 % nasal spray Place 2 sprays into both nostrils 3 (three) times daily.   ? levalbuterol (XOPENEX HFA) 45 MCG/ACT inhaler Inhale 2 puffs into the lungs every 4 (four) hours as needed for wheezing.   ? levalbuterol (XOPENEX HFA) 45 MCG/ACT inhaler  Inhale 2 puffs into the lungs every 4 (four) hours as needed for wheezing.   ? loratadine (CLARITIN) 10 MG tablet Take 10 mg by mouth in the morning.   ? Magnesium 250 MG TABS Take 250 mg by mouth every evening.   ? mexiletine (MEXITIL) 150 MG capsule TAKE 2 CAPSULES(300 MG) BY MOUTH TWICE DAILY   ? Multiple  Vitamin (MULTIVITAMIN WITH MINERALS) TABS Take 1 tablet by mouth every evening.   ? nitroGLYCERIN (NITROSTAT) 0.4 MG SL tablet DISSOLVE 1 TABLET UNDER THE TONGUE EVERY 5 MINUTES AS NEEDED FOR CHEST PAIN (Patient taking differently: Place 0.4 mg under the tongue every 5 (five) minutes x 3 doses as needed for chest pain.) 02/14/2020: Hasnt used in 10 years  ? Omeprazole 20 MG TBEC SMARTSIG:1 By Mouth   ? Pirfenidone (ESBRIET) 267 MG TABS Take 2 tablets (534 mg total) by mouth with breakfast, with lunch, and with evening meal.   ? polyvinyl alcohol-povidone (HYPOTEARS) 1.4-0.6 % ophthalmic solution Place 1-2 drops into both eyes in the morning and at bedtime.   ? potassium chloride SA (KLOR-CON M) 20 MEQ tablet Take 1 tablet (20 mEq total) by mouth daily with lunch.   ? rosuvastatin (CRESTOR) 40 MG tablet TAKE 1 TABLET(40 MG) BY MOUTH EVERY EVENING   ? Simethicone (GAS-X PO) Take by mouth.   ? XARELTO 20 MG TABS tablet TAKE 1 TABLET(20 MG) BY MOUTH DAILY WITH SUPPER   ? ?No facility-administered encounter medications on file as of 04/25/2021.  ? ? ?Patient Active Problem List  ? Diagnosis Date Noted  ? Physical deconditioning 04/06/2021  ? IPF (idiopathic pulmonary fibrosis) ([place]) 11/16/2020  ? Encounter for follow-up surveillance of colon cancer   ? Benign neoplasm of descending colon   ? Pulmonary nodules 05/18/2015  ? Rectal bleeding 03/29/2015  ? Internal hemorrhoid, bleeding 03/29/2015  ? COPD (chronic obstructive pulmonary disease) ([place]) 03/28/2015  ? Gastroesophageal reflux disease 03/17/2015  ? Prostate cancer screening 03/09/2014  ? Thoracic aortic aneurysm ([place]) 09/28/2013  ? Encounter for therapeutic drug monitoring 03/03/2013  ? Constipation, chronic 10/21/2012  ? Umbilical hernia with intermittent pain 10/21/2012  ? Obesity (BMI 30-39.9) 10/21/2012  ? TIA (transient ischemic attack) 09/25/2012  ? Other malaise and fatigue 08/25/2012  ? Vitamin D deficiency 07/28/2012  ? CHF (congestive heart failure) ([place])  07/28/2012  ? Chronotropic incompetence 10/25/2011  ? History of non anemic vitamin B12 deficiency 05/29/2011  ? Mild cognitive impairment 01/13/2011  ? Ringing in ears 01/13/2011  ? CAD (coronary artery disease) 09/14/2010  ? Elevated transaminase level--on amiodarone 06/19/2010  ? Biventricular implantable cardioverter-defibrillator in situ 05/16/2010  ? Long term (current) use of anticoagulants 04/11/2010  ? Microscopic hematuria 04/04/2010  ? CLAUDICATION 11/28/2009  ? Atrial fibrillation ([place]) 06/29/2009  ? Chronic combined systolic and diastolic heart failure ([place]) 05/26/2009  ? Ischemic cardiomyopathy 07/07/2008  ? VENTRICULAR TACHYCARDIA 06/29/2008  ? Hyperlipidemia 01/04/2008  ? Essential hypertension 01/04/2008  ? Chronic rhinitis 03/04/2007  ? Obstructive sleep apnea 03/02/2007  ? ? ?Conditions to be addressed/monitored:Atrial Fibrillation, CHF, HTN, HLD, COPD, and idiopathic pulmonary fibrosis ? ?Care Plan : RN Care Manager Plan of Care  ?Updates made by Dimitri Ped, RN since 04/25/2021 12:00 AM  ?  ? ?Problem: Chronic Disease Management and Care Coordination Needs (COPD, Afib, CHF, idiopathic pulmonary fibrosis,HTN, HLD)   ?Priority: High  ?  ? ?Long-Range Goal: Establish Plan of Care for Chronic Disease Management Needs (COPD, Afib, CHF, idiopathic pulmonary fibrosis,HTN, HLD)   ?  Start Date: 04/25/2021  ?Expected End Date: 04/25/2022  ?Priority: High  ?Note:   ?Current Barriers:  ?Knowledge Deficits related to plan of care for management of Atrial Fibrillation, CHF, HTN, HLD, COPD, and Idiopathic Pulmonary Fibrosis  ?Chronic Disease Management support and education needs related to Atrial Fibrillation, CHF, HTN, HLD, COPD, and Idiopathic Pulmonary Fibrosis   ?States he is currently going to pulmonary rehab and he is walking at home to get exercise.  States he always has a cough and he is using his inhalers as directed.  States since he started the [place] he has lost weight and his  weight is down to 187 now.  States he does weigh daily.  States he follows a low sodium healthy diet. States he is getting patient assistance for his expensive medications which helps a lot.  ? ?[place]

## 2021-04-26 ENCOUNTER — Encounter (HOSPITAL_COMMUNITY): Admission: RE | Admit: 2021-04-26 | Payer: Medicare Other | Source: Ambulatory Visit

## 2021-04-27 ENCOUNTER — Other Ambulatory Visit: Payer: Self-pay | Admitting: Cardiology

## 2021-04-27 DIAGNOSIS — I429 Cardiomyopathy, unspecified: Secondary | ICD-10-CM

## 2021-04-27 DIAGNOSIS — Z95810 Presence of automatic (implantable) cardiac defibrillator: Secondary | ICD-10-CM

## 2021-04-27 DIAGNOSIS — I059 Rheumatic mitral valve disease, unspecified: Secondary | ICD-10-CM

## 2021-04-27 DIAGNOSIS — I480 Paroxysmal atrial fibrillation: Secondary | ICD-10-CM

## 2021-04-30 ENCOUNTER — Other Ambulatory Visit (HOSPITAL_COMMUNITY): Payer: Self-pay

## 2021-05-01 ENCOUNTER — Other Ambulatory Visit (HOSPITAL_COMMUNITY): Payer: Self-pay

## 2021-05-01 ENCOUNTER — Encounter (HOSPITAL_COMMUNITY)
Admission: RE | Admit: 2021-05-01 | Discharge: 2021-05-01 | Disposition: A | Discharge status: Home / Self Care | Payer: Medicare Other | Source: Ambulatory Visit | Attending: Internal Medicine | Admitting: Internal Medicine

## 2021-05-01 ENCOUNTER — Telehealth: Payer: Self-pay

## 2021-05-01 VITALS — Wt 192.9 lb

## 2021-05-01 DIAGNOSIS — J84112 Idiopathic pulmonary fibrosis: Secondary | ICD-10-CM | POA: Diagnosis not present

## 2021-05-01 NOTE — Telephone Encounter (Signed)
Patient Advocate Encounter ? ?Prior Authorization for Xopenex 37mg has been approved.   ? ?PA# N/A ? ?Effective dates: 05/01/21 through 05/02/22 ? ?Per Test Claim Patients co-pay is $15.61.  ? ?Spoke with Pharmacy to Process. ? ?Patient Advocate ?Fax: [phone] ?

## 2021-05-01 NOTE — Telephone Encounter (Signed)
Patient Advocate Encounter ?  ?Received notification from [facility] that prior authorization for Xopenex 69mg is required by his/her insurance [facility] ?  ?PA submitted on 05/01/21 ? ?Key#: [id number] ? ?Status is pending ?   ? Clinic will continue to follow: ? ?Patient Advocate ?Fax: [phone] ?

## 2021-05-01 NOTE — Progress Notes (Signed)
Daily Session Note ? ?Patient Details  ?Name: JOSEPHUS HARRIGER ?MRN: [id number] ?Date of Birth: Nov 18, 1940 ?Referring Provider:   ?Flowsheet Row Pulmonary Rehab Walk Test from 02/28/2021 in [place]  ?Referring Provider Ramaswamy  ? ?  ? ? ?Encounter Date: 05/01/2021 ? ?Check In: ? Session Check In - 05/01/21 1515   ? ?  ? Check-In  ? Supervising physician immediately available to respond to emergencies [facility] immediately available   ? Physician(s) Dr. Pietro Cassis   ? Location [facility]   ? Staff Present Rosebud Poles, RN, Quentin Ore, MS, ACSM-CEP, Exercise Physiologist;Calub Tarnow Ysidro Evert, RN;Portia Rollene Rotunda, RN, BSN   ? Virtual Visit No   ? Medication changes reported     No   ? Fall or balance concerns reported    No   ? Tobacco Cessation No Change   ? Warm-up and Cool-down Performed as group-led instruction   ? Resistance Training Performed Yes   ? VAD Patient? No   ? PAD/SET Patient? No   ?  ? Pain Assessment  ? Currently in Pain? No/denies   ? Multiple Pain Sites No   ? ?  ?  ? ?  ? ? ?Capillary Blood Glucose: ?No results found. However, due to the size of the patient record, not all encounters were searched. Please check Results Review for a complete set of results. ? ? Exercise Prescription Changes - 05/01/21 1500   ? ?  ? Response to Exercise  ? Blood Pressure (Admit) 106/60   ? Blood Pressure (Exercise) 94/50   ? Blood Pressure (Exit) 100/58   ? Heart Rate (Admit) 69 bpm   ? Heart Rate (Exercise) 93 bpm   ? Heart Rate (Exit) 70 bpm   ? Oxygen Saturation (Admit) 97 %   ? Oxygen Saturation (Exercise) 95 %   ? Oxygen Saturation (Exit) 97 %   ? Rating of Perceived Exertion (Exercise) 13   ? Perceived Dyspnea (Exercise) 3   ? Duration Continue with 30 min of aerobic exercise without signs/symptoms of physical distress.   ? Intensity THRR unchanged   ?  ? Progression  ? Progression Continue to progress workloads to maintain intensity without signs/symptoms of physical  distress.   ?  ? Resistance Training  ? Training Prescription Yes   ? Weight Blue bands   ? Reps 10-15   ? Time 10 Minutes   ?  ? NuStep  ? Level 3   ? SPM 80   ? Minutes 15   ? METs 2.1   ?  ? Track  ? Laps 18   ? Minutes 15   ? ?  ?  ? ?  ? ? ?Social History  ? ?Tobacco Use  ?Smoking Status Former  ? Packs/day: 2.00  ? Years: 15.00  ? Pack years: 30.00  ? Types: Cigarettes  ? Quit date: 01/15/1980  ? Years since quitting: 41.3  ?Smokeless Tobacco Never  ? ? ?Goals Met:  ?Exercise tolerated well ?No report of concerns or symptoms today ?Strength training completed today ? ?Goals Unmet:  ?Not Applicable ? ?Comments: Service time is from 1322 to 1433 ? ? ? ?Dr. Rodman Pickle is Medical Director for Pulmonary Rehab at [facility].  ?

## 2021-05-02 DIAGNOSIS — Z961 Presence of intraocular lens: Secondary | ICD-10-CM | POA: Diagnosis not present

## 2021-05-02 DIAGNOSIS — H52203 Unspecified astigmatism, bilateral: Secondary | ICD-10-CM | POA: Diagnosis not present

## 2021-05-02 DIAGNOSIS — H2511 Age-related nuclear cataract, right eye: Secondary | ICD-10-CM | POA: Diagnosis not present

## 2021-05-03 ENCOUNTER — Encounter (HOSPITAL_COMMUNITY)
Admission: RE | Admit: 2021-05-03 | Discharge: 2021-05-03 | Disposition: A | Discharge status: Home / Self Care | Payer: Medicare Other | Source: Ambulatory Visit | Attending: Internal Medicine | Admitting: Internal Medicine

## 2021-05-03 VITALS — Wt 192.2 lb

## 2021-05-03 DIAGNOSIS — J84112 Idiopathic pulmonary fibrosis: Secondary | ICD-10-CM

## 2021-05-03 NOTE — Progress Notes (Signed)
Daily Session Note ? ?Patient Details  ?Name: [name] ?MRN: [id number] ?Date of Birth: 06-21-1940 ?Referring Provider:   ?Flowsheet Row Pulmonary Rehab Walk Test from 02/28/2021 in [place]  ?Referring Provider Ramaswamy  ? ?  ? ? ?Encounter Date: 05/03/2021 ? ?Check In: ? Session Check In - 05/03/21 1427   ? ?  ? Check-In  ? Supervising physician immediately available to respond to emergencies [facility] immediately available   ? Physician(s) Dr. Pietro Cassis   ? Location [facility]   ? Staff Present Rosebud Poles, RN, Quentin Ore, MS, ACSM-CEP, Exercise Physiologist;Lisa Ysidro Evert, RN   ? Virtual Visit No   ? Medication changes reported     No   ? Fall or balance concerns reported    No   ? Tobacco Cessation No Change   ? Warm-up and Cool-down Performed as group-led instruction   ? Resistance Training Performed Yes   ? VAD Patient? No   ? PAD/SET Patient? No   ?  ? Pain Assessment  ? Currently in Pain? No/denies   ? Multiple Pain Sites No   ? ?  ?  ? ?  ? ? ?Capillary Blood Glucose: ?No results found. However, due to the size of the patient record, not all encounters were searched. Please check Results Review for a complete set of results. ? ? ? ?Social History  ? ?Tobacco Use  ?Smoking Status Former  ? Packs/day: 2.00  ? Years: 15.00  ? Pack years: 30.00  ? Types: Cigarettes  ? Quit date: 01/15/1980  ? Years since quitting: 41.3  ?Smokeless Tobacco Never  ? ? ?Goals Met:  ?Proper associated with RPD/PD & O2 Sat ?Exercise tolerated well ?No report of concerns or symptoms today ? ?Goals Unmet:  ?Not Applicable ? ?Comments: Service time is from 1248 to 1303. Completed post 6 MWT. ? ? ?Dr. Rodman Pickle is Medical Director for Pulmonary Rehab at [facility].  ?

## 2021-05-03 NOTE — Progress Notes (Signed)
Discharge Progress Report ? ?Patient Details  ?Name: [name] ?MRN: [id number] ?Date of Birth: 1940-02-03 ?Referring Provider:   ?Flowsheet Row Pulmonary Rehab Walk Test from 02/28/2021 in [place]  ?Referring Provider Ramaswamy  ? ?  ? ? ? ?Number of Visits: 17 ? ?Reason for Discharge:  ?Patient has met program and personal goals. ? ?Smoking History:  ?Social History  ? ?Tobacco Use  ?Smoking Status Former  ? Packs/day: 2.00  ? Years: 15.00  ? Pack years: 30.00  ? Types: Cigarettes  ? Quit date: 01/15/1980  ? Years since quitting: 41.3  ?Smokeless Tobacco Never  ? ? ?Diagnosis:  ?Idiopathic pulmonary fibrosis ([place]) ? ?ADL [facility]: ? Pulmonary Assessment Scores   ? ? [place] Name 02/28/21 1153 05/01/21 1528  ?  ?  ? ADL [facility]  ? ADL Phase Entry Exit   ? SOB Score total 42 37   ?  ? CAT Score  ? CAT Score 29 23   ?  ? mMRC Score  ? mMRC Score 2 --   ? ?  ?  ? ?  ? ? ?Initial Exercise Prescription: ? Initial Exercise Prescription - 02/28/21 1200   ? ?  ? Date of Initial Exercise RX and Referring Provider  ? Date 02/28/21   ? Referring Provider Ramaswamy   ? Expected Discharge Date 05/03/21   ?  ? [place] Bike  ? Level --   ? [place] --   ? Minutes --   ?  ? NuStep  ? Level 1   ? SPM 70   ? Minutes 15   ? METs 2.74   ?  ? Track  ? Minutes 15   ?  ? Prescription Details  ? Frequency (times per week) 2   ? Duration Progress to 30 minutes of continuous aerobic without signs/symptoms of physical distress   ?  ? Intensity  ? THRR 40-80% of Max Heartrate 56-113   ? Ratings of Perceived Exertion 11-13   ? Perceived Dyspnea 0-4   ?  ? Progression  ? Progression Continue to progress workloads to maintain intensity without signs/symptoms of physical distress.   ?  ? Resistance Training  ? Training Prescription Yes   ? Weight blue bands   ? Reps 10-15   ? ?  ?  ? ?  ? ? ?Discharge Exercise Prescription (Final Exercise Prescription Changes): ? Exercise Prescription Changes - 05/01/21 1500   ? ?  ? Response  to Exercise  ? Blood Pressure (Admit) 106/60   ? Blood Pressure (Exercise) 94/50   ? Blood Pressure (Exit) 100/58   ? Heart Rate (Admit) 69 bpm   ? Heart Rate (Exercise) 93 bpm   ? Heart Rate (Exit) 70 bpm   ? Oxygen Saturation (Admit) 97 %   ? Oxygen Saturation (Exercise) 95 %   ? Oxygen Saturation (Exit) 97 %   ? Rating of Perceived Exertion (Exercise) 13   ? Perceived Dyspnea (Exercise) 3   ? Duration Continue with 30 min of aerobic exercise without signs/symptoms of physical distress.   ? Intensity THRR unchanged   ?  ? Progression  ? Progression Continue to progress workloads to maintain intensity without signs/symptoms of physical distress.   ?  ? Resistance Training  ? Training Prescription Yes   ? Weight Blue bands   ? Reps 10-15   ? Time 10 Minutes   ?  ? NuStep  ? Level 3   ? SPM  80   ? Minutes 15   ? METs 2.1   ?  ? Track  ? Laps 18   ? Minutes 15   ? ?  ?  ? ?  ? ? ?Functional Capacity: ? 6 Minute Walk   ? ? [place] Name 02/28/21 1256 05/03/21 1545  ?  ?  ? 6 Minute Walk  ? Phase Initial Discharge   ? Distance 1220 feet 1550 feet   ? Distance % Change -- 27.05 %   ? Distance Feet Change -- 330 ft   ? Walk Time 6 minutes 6 minutes   ? # of Rest Breaks 0 0   ? MPH 2.31 2.94   ? METS 2.74 2.63   ? RPE 9 13   ? Perceived Dyspnea  0 3   ? VO2 Peak 9.6 9.21   ? Symptoms No No   ? Resting HR 70 bpm 70 bpm   ? Resting BP 108/68 114/60   ? Resting Oxygen Saturation  96 % 97 %   ? Exercise Oxygen Saturation  during 6 min walk 94 % 94 %   ? Max Ex. HR 143 bpm 86 bpm   ? Max Ex. BP 138/68 112/60   ? 2 Minute Post BP 110/60 110/60   ?  ? Interval HR  ? 1 Minute HR 116 70   ? 2 Minute HR 114 71   ? 3 Minute HR 116 72   ? 4 Minute HR 129 83   ? 5 Minute HR 143 83   ? 6 Minute HR 140 86   ? 2 Minute Post HR 70 70   ? Interval Heart Rate? Yes Yes   ?  ? Interval Oxygen  ? Interval Oxygen? Yes Yes   ? Baseline Oxygen Saturation % 96 % 97 %   ? 1 Minute Oxygen Saturation % 96 % 96 %   ? 1 Minute Liters of Oxygen 0 L 0 L   ? 2  Minute Oxygen Saturation % 95 % 95 %   ? 2 Minute Liters of Oxygen 0 L 0 L   ? 3 Minute Oxygen Saturation % 94 % 95 %   ? 3 Minute Liters of Oxygen 0 L 0 L   ? 4 Minute Oxygen Saturation % 94 % 94 %   ? 4 Minute Liters of Oxygen 0 L 0 L   ? 5 Minute Oxygen Saturation % 95 % 94 %   ? 5 Minute Liters of Oxygen 0 L 0 L   ? 6 Minute Oxygen Saturation % 95 % 94 %   ? 6 Minute Liters of Oxygen 0 L 0 L   ? 2 Minute Post Oxygen Saturation % 97 % 97 %   ? 2 Minute Post Liters of Oxygen 0 L 0 L   ? ?  ?  ? ?  ? ? ?Psychological, QOL, Others - Outcomes: ?PHQ 2/9: ? ?  05/03/2021  ? 12:47 PM 04/25/2021  ?  2:59 PM 02/28/2021  ? 11:21 AM 11/16/2020  ? 10:08 AM 11/06/2020  ? 12:30 PM  ?Depression screen PHQ 2/9  ?Decreased Interest 0 0 0 2 0  ?Down, Depressed, Hopeless 0 0 0 1 0  ?PHQ - 2 Score 0 0 0 3 0  ?Altered sleeping 0  1 1   ?Tired, decreased energy 0  1 2   ?Change in appetite 0  0 0   ?Feeling bad or failure about yourself  0  0 0   ?Trouble concentrating 0  0 0   ?Moving slowly or fidgety/restless 0  0 0   ?Suicidal thoughts 0  0 0   ?PHQ-9 Score 0  2 6   ?Difficult doing work/chores   Very difficult    ? ? ?Quality of Life: ? ? ?Personal Goals: ?Goals established at orientation with interventions provided to work toward goal. ? Personal Goals and Risk Factors at Admission - 02/28/21 1127   ? ?  ? Core Components/Risk Factors/Patient Goals on Admission  ?  Weight Management Weight Loss   ? Improve shortness of breath with ADL's Yes   ? Intervention Provide education, individualized exercise plan and daily activity instruction to help decrease symptoms of SOB with activities of daily living.   ? Expected Outcomes Short Term: Improve cardiorespiratory fitness to achieve a reduction of symptoms when performing ADLs;Long Term: Be able to perform more ADLs without symptoms or delay the onset of symptoms   ? Increase knowledge of respiratory medications and ability to use respiratory devices properly  Yes   ? Intervention Provide  education and demonstration as needed of appropriate use of medications, inhalers, and oxygen therapy.   ? Expected Outcomes Short Term: Achieves understanding of medications use. Understands that oxygen is a medication prescribed by physician. Demonstrates appropriate use of inhaler and oxygen therapy.;Long Term: Maintain appropriate use of medications, inhalers, and oxygen therapy.   ? ?  ?  ? ?  ?  ? ?Personal Goals Discharge: ? Goals and Risk Factor Review   ? ? [place] Name 03/14/21 8466 04/10/21 1601  ?  ?  ?  ?  ? Core Components/Risk Factors/Patient Goals Review  ? Personal Goals Review Weight Management/Obesity;Improve shortness of breath with ADL's;Develop more efficient breathing techniques such as purse lipped breathing and diaphragmatic breathing and practicing self-pacing with activity.;Increase knowledge of respiratory medications and ability to use respiratory devices properly. Weight Management/Obesity;Develop more efficient breathing techniques such as purse lipped breathing and diaphragmatic breathing and practicing self-pacing with activity.;Increase knowledge of respiratory medications and ability to use respiratory devices properly.;Improve shortness of breath with ADL's     ? Review Anthem's weigth has been stable with no weight loss or gain since starting the program. He has had progressing or steady workloads on the track and the nustep. He complains of no to mild SOB  and that he is working very light to fairly light during exercise. His oxygen saturations during exercise have been 94-96% on room air. He has no oxygen at home. [place] has lost 4 kg and has tolerated increasing his workloads on the track and nustep.  He feels stronger since exercising 2 days/week in pulmonary rehab.  He is becoming knowledgeable about how to exercise safely based on his health issues.     ? Expected Outcomes For Elek to have progressing workloads and MET levels with his exercising.Also for him to have improving SOB  with his ADL's and breathing techniques. See admission goals.     ? ?  ?  ? ?  ? ? ?Exercise Goals and Review: ? Exercise Goals   ? ? [place] Name 02/28/21 1139 03/06/21 1537 04/03/21 0846  ?  ?  ?  ? Ex

## 2021-05-07 ENCOUNTER — Encounter: Payer: Self-pay | Admitting: Internal Medicine

## 2021-05-07 ENCOUNTER — Ambulatory Visit (INDEPENDENT_AMBULATORY_CARE_PROVIDER_SITE_OTHER): Payer: Medicare Other | Admitting: Internal Medicine

## 2021-05-07 VITALS — BP 124/64 | HR 75 | Temp 97.1°F | Ht 70.0 in | Wt 194.2 lb

## 2021-05-07 DIAGNOSIS — I5022 Chronic systolic (congestive) heart failure: Secondary | ICD-10-CM | POA: Diagnosis not present

## 2021-05-07 DIAGNOSIS — J84112 Idiopathic pulmonary fibrosis: Secondary | ICD-10-CM | POA: Diagnosis not present

## 2021-05-07 DIAGNOSIS — I255 Ischemic cardiomyopathy: Secondary | ICD-10-CM

## 2021-05-07 NOTE — Patient Instructions (Addendum)
Pulmnary Fibrosis with emphysema ?ILD (interstitial lung disease) ([place]) ?IPF (idiopathic pulmonary fibrosis) ([place]) ? ?- IPF is stable clinically since last visit.  ?- Clinically feeling better after starting rehab and GI symptoms better controlled ?-Currently on Esbriet 2 pills 3 times daily  ? -  being capped because baseline mexilitine can increase esbriet levels ?--Normal transaminitis on liver function test  march 2023 ?- Baseline creatinine 0.3m% In march 2023 ? ?Plan ?- continue esbriet 2 pills x 3 times daily ? - controlling GI symptoms with omeprazole, miralax, ginger regimen ?- Continue pulmonary rehab ?- [facility] ?- get spirometry and dlco in 3 months ? - appreciate clinical trial interst in future ? - hold off on daewoong study due to GI side effect concern ? ? ?Chronic obstructive pulmonary disease, unspecified COPD type ([place] ? ?- not in flare up ? ?Plan ? - cotninue breztri daily ?- albueo as needed ? ?GERD ?- appears omeprazole helping you ? ?Plan ? - continue omeprazole on empty stomach - as it has potential beneficial effect for IPF ? ?Weight loss- drug induced ?Low appetite ? ? -ongoing weight loss  due to esbriet and jardiance  ? ?Plan ? -monitor; ok to tolerate weight loss till 170- 175# ? ? ?Followup ?- 3 momths but after spiro/dlco  - 30 min visit; symptoms score and walk test at followup ? ? ?

## 2021-05-07 NOTE — Progress Notes (Signed)
? ? ? ?     ?ROV 02/19/18 --81 year old man who follows up today for mixed obstruction and restriction, COPD as well as stable bilateral lower lobe interstitial disease.  We questioned possible amiodarone toxicity and this was stopped.  He had a repeat high-resolution CT scan of the chest on 02/17/2018 that I reviewed.  This shows some mild emphysematous change irregular peripheral interstitial disease with some mild associated bronchiectasis, no significant honeycomb change.  Unchanged compared with his prior 1 year ago.  Pulmonary function testing done on 02/09/2018 shows mild mixed restriction and obstruction, overall stable compared with his prior 1 year ago.       ? ?ROV 04/23/19 --[name] is 81 and has a history of mixed obstruction and restriction on pulmonary function testing, COPD as well as stable bilateral lower lobe interstitial disease.  His last CT scan was 02/17/2018 (stable).  He was previously on amiodarone, this has been stopped for several years.  Currently managed on Symbicort, is using albuterol very rarely ?He reports that he is having some imbalance, feels unstable w ambulation, has had some falls.  ?He is having more cough, mucous production over the last several months. He has a lot of nasal gtt - uses Atrovent NS with improvement. He is on flonase daily, loratadine.  ? ?Chest x-ray from 04/15/2019 reviewed by me, shows persistent interstitial markings particularly in the right midlung. No significant change or increase noted when comparing with prior films. ? ? ?ROV 09/27/20 --follow-up visit 81 year old gentleman whom we have followed for mixed obstruction and restriction due to COPD and also bilateral lower lobe interstitial disease question amiodarone toxicity.  Autoimmune panel in October 2017 was negative.  He is now in atrial fibrillation almost all the time, notices that his dyspnea is associated with tachycardia.  Currently managed on Symbicort, albuterol which he is using 2-3x a month.   ?He is having a lot of cough and nasal congestion / drainage. He wonders if the flonase is part of the problem, was told to do atrovent up to tid to see if he gets benefit.  ? ?Most recent CT chest performed 09/22/2020 reviewed by me, shows persistent patchy groundglass attenuation and septal thickening in the subpleural regions without any definite honeycomb, possibly slightly progressed compared with 02/17/2018. ? ?Pulmonary function testing performed today and reviewed by me, showed mixed restriction and obstruction with an FEV1 of 1.9 865% predicted and no bronchodilator response.  Significant decrease in FEV1 compared with 02/09/2018.  Restricted lung volumes, decreased diffusion capacity that does correct when adjusted for his alveolar volume. ? ?OV 10/05/2020 -transfer of care to the ILD center with Dr. Chase Caller.  Patient being referred by Dr. Baltazar Apo ? ?Subjective:  ?Patient ID: [name], male , DOB: 24-Jul-1940 , age 81 y.o. , MRN: [id number] , ADDRESS: [address] ?[place] [id number] ?PCP [name], MD ?Patient Care Team: ?[name], MD as PCP - General (Family Medicine) ?Festus Aloe, MD as Consulting Physician (Urology) ?Michael Boston, MD as Consulting Physician (General Surgery) ?[name], MD as Consulting Physician (Cardiology) ?Jodi Marble, MD as Consulting Physician (Otolaryngology) ?Deboraha Sprang, MD as Consulting Physician (Cardiology) ?Luberta Mutter, MD as Consulting Physician (Ophthalmology) ?Collene Gobble, MD as Consulting Physician (Pulmonary Disease) ?Mauri Pole, MD as Consulting Physician (Gastroenterology) ?[name], [facility] as Pharmacist (Pharmacist) ? ?This Provider for this visit: Treatment Team:  ?Attending Provider: Brand Males, MD ? ? ? ?10/05/2020 -   ?Chief  Complaint  ?Patient presents with  ? Follow-up  ?  Pt states that he does have complaints of SOB that can happen at any time but is worse with exertion.   ? ? ? ?HPI ?[name] 81 y.o. -referred by Dr. Baltazar Apo.  History is provided by the patient and also review of the medical records.  Patient tells me he is aware that he has ILD.  He has a significant symptom burden.  He says he is recently been told his ILD is gotten worse.  He was on amiodarone for many years.  Then approximately 18 months ago he stopped it because he got worried about the drug.  Dr. Lamonte Sakai notes indicate that patient has combined emphysema and interstitial lung disease.  His pulmonary function test profile shows 20% decline in FVC in the last 5 years or so with most of it coming in the last 2 years.  High-resolution CT chest only shows some decline.  High-resolution CT chest personally visualized by me and is probable UIP.  His serologies are negative.  He is a former smoker having quit over 40 years ago.  He is Caucasian and his age is greater than 53.  Male gender.  He definitely has progressive symptoms.  He really wants a good symptom control.  He also wants understand his disease.  Today most of the focus was spent on helping him understand interstitial lung disease.  I explicitly told him that I would not be able to address symptom burden today.  He will also switch his comprehensive pulmonary care to see Dr. Chase Caller myself. ? ?He has had an MI in the past.  His atrial fibrillation is on Eliquis.  He is no longer on amiodarone.  His walking desaturation test did not show desaturation more than 3 points. ? ? ?CT Chest data Sept 2022 ? ?Narrative & Impression  ?CLINICAL DATA:  81 year old male with history of COPD. Evaluate for ?interstitial lung disease. ?  ?EXAM: ?CT CHEST WITHOUT CONTRAST ?  ?TECHNIQUE: ?Multidetector CT imaging of the chest was performed following the ?standard protocol without intravenous contrast. High resolution ?imaging of the lungs, as well as inspiratory and expiratory imaging, ?was performed. ?  ?COMPARISON:  Chest CT 02/17/2018. ?   ?FINDINGS: ?Cardiovascular: Heart size is normal. There is no significant ?pericardial fluid, thickening or pericardial calcification. There is ?aortic atherosclerosis, as well as atherosclerosis of the great ?vessels of the mediastinum and the coronary arteries, including ?calcified atherosclerotic plaque in the left main, left anterior ?descending, left circumflex and right coronary arteries. ?Calcifications of the aortic valve. Curvilinear calcifications ?associated with the myocardium of the inferior wall of the left ?ventricle in the mid ventricle and base, likely sequela of prior ?left circumflex coronary artery territory myocardial infarction(s). ?Left-sided biventricular pacemaker/AICD with lead tips terminating ?in the right atrial appendage, right ventricular apex, and overlying ?the lateral wall the left ventricle via the coronary sinus and ?coronary veins. ?  ?Mediastinum/Nodes: No pathologically enlarged mediastinal or hilar ?lymph nodes. Please note that accurate exclusion of hilar adenopathy ?is limited on noncontrast CT scans. Esophagus is unremarkable in ?appearance. No axillary lymphadenopathy. ?  ?Lungs/Pleura: High-resolution images again demonstrate some patchy ?areas of ground-glass attenuation, septal thickening, subpleural ?reticulation, mild traction bronchiectasis and peripheral ?bronchiolectasis in the lungs bilaterally. No definite honeycombing. ?These findings have a mild craniocaudal gradient. Inspiratory and ?expiratory imaging is unremarkable. No acute consolidative airspace ?disease. No pleural effusions. Chronic elevation of the right ?hemidiaphragm is again noted. No definite  suspicious appearing ?pulmonary nodules or masses are noted. ?  ?Upper Abdomen: Low-attenuation lesions are noted in the left lobe of ?the liver, incompletely characterized on today's non-contrast CT ?examination, but similar to the prior study and measuring up to 1.3 ?cm in diameter in segment 2,  statistically likely to represent ?cysts. ?  ?Musculoskeletal: There are no aggressive appearing lytic or blastic ?lesions noted in the visualized portions of the skeleton. ?  ?IMPRESSION: ?1. The appearance of the lungs is compatible w

## 2021-05-13 DIAGNOSIS — J449 Chronic obstructive pulmonary disease, unspecified: Secondary | ICD-10-CM

## 2021-05-13 DIAGNOSIS — E7800 Pure hypercholesterolemia, unspecified: Secondary | ICD-10-CM

## 2021-05-13 DIAGNOSIS — I10 Essential (primary) hypertension: Secondary | ICD-10-CM

## 2021-05-13 DIAGNOSIS — I4821 Permanent atrial fibrillation: Secondary | ICD-10-CM

## 2021-05-13 DIAGNOSIS — I5022 Chronic systolic (congestive) heart failure: Secondary | ICD-10-CM | POA: Diagnosis not present

## 2021-05-17 ENCOUNTER — Ambulatory Visit: Payer: Medicare Other | Admitting: Family Medicine

## 2021-05-18 ENCOUNTER — Ambulatory Visit (INDEPENDENT_AMBULATORY_CARE_PROVIDER_SITE_OTHER): Payer: Medicare Other

## 2021-05-18 DIAGNOSIS — I255 Ischemic cardiomyopathy: Secondary | ICD-10-CM

## 2021-05-18 LAB — CUP PACEART REMOTE DEVICE CHECK
Battery Remaining Longevity: 90 mo
Battery Remaining Percentage: 100 %
Brady Statistic RA Percent Paced: 0 %
Brady Statistic RV Percent Paced: 97 %
Date Time Interrogation Session: 20230505013400
HighPow Impedance: 41 Ohm
Implantable Lead Implant Date: 20020527
Implantable Lead Implant Date: 20020527
Implantable Lead Implant Date: 20131212
Implantable Lead Location: 753858
Implantable Lead Location: 753859
Implantable Lead Location: 753860
Implantable Lead Model: 148
Implantable Lead Model: 4194
Implantable Lead Model: 6940
Implantable Lead Serial Number: 120244
Implantable Pulse Generator Implant Date: 20220131
Lead Channel Impedance Value: 530 Ohm
Lead Channel Impedance Value: 652 Ohm
Lead Channel Impedance Value: 666 Ohm
Lead Channel Setting Pacing Amplitude: 2 V
Lead Channel Setting Pacing Amplitude: 2.5 V
Lead Channel Setting Pacing Amplitude: 2.5 V
Lead Channel Setting Pacing Pulse Width: 0.4 ms
Lead Channel Setting Pacing Pulse Width: 1 ms
Lead Channel Setting Sensing Sensitivity: 0.6 mV
Lead Channel Setting Sensing Sensitivity: 1 mV
Pulse Gen Serial Number: 149562

## 2021-05-21 ENCOUNTER — Encounter: Payer: Self-pay | Admitting: Family Medicine

## 2021-05-21 ENCOUNTER — Telehealth: Payer: Self-pay | Admitting: Pharmacist

## 2021-05-21 ENCOUNTER — Ambulatory Visit (INDEPENDENT_AMBULATORY_CARE_PROVIDER_SITE_OTHER): Payer: Medicare Other | Admitting: Family Medicine

## 2021-05-21 ENCOUNTER — Other Ambulatory Visit: Payer: Self-pay | Admitting: Cardiology

## 2021-05-21 VITALS — BP 108/60 | HR 71 | Temp 98.8°F | Resp 16 | Ht 70.0 in | Wt 192.4 lb

## 2021-05-21 DIAGNOSIS — E7800 Pure hypercholesterolemia, unspecified: Secondary | ICD-10-CM | POA: Diagnosis not present

## 2021-05-21 DIAGNOSIS — E669 Obesity, unspecified: Secondary | ICD-10-CM

## 2021-05-21 DIAGNOSIS — I255 Ischemic cardiomyopathy: Secondary | ICD-10-CM

## 2021-05-21 DIAGNOSIS — I10 Essential (primary) hypertension: Secondary | ICD-10-CM | POA: Diagnosis not present

## 2021-05-21 NOTE — Assessment & Plan Note (Signed)
Chronic problem.  On Crestor 40mg daily w/o difficulty.  Check labs.  Adjust meds prn  

## 2021-05-21 NOTE — Assessment & Plan Note (Signed)
Chronic problem.  Well controlled on Entresto 24/42m BID, Lasix 854mdaily, Coreg 3.12543mID.  Currently asymptomatic.  [facility] but no anticipated med changes. ?

## 2021-05-21 NOTE — Assessment & Plan Note (Signed)
Improved.  Pt has lost 20 lbs since starting Esbriet and Jardiance.  Waist decreased from 44 --> 38".  He is walking regularly.  Applauded his efforts.  Will continue to follow. ?

## 2021-05-21 NOTE — Progress Notes (Signed)
? ?  Subjective:  ? ? Patient ID: [name], male    DOB: March 24, 1940, 81 y.o.   MRN: [id number] ? ?HPI ?HTN- chronic problem, on Entresto 24/43m BID, Lasix 838mdaily, Coreg 3.12572mID w/ good control.  No CP, SOB above baseline, HAs, visual changes, edema. ? ?Hyperlipidemia- chronic problem, on Crestor 24m92mily.  Walking daily.  Denies abd pain, N/V. ? ?Overweight- pt has lost 20 lbs since taking Esbriet and Jardiance.  Waist size has decreased from 44 --> 38".   ? ? ?Review of Systems ?For ROS see HPI  ?   ?Objective:  ? Physical Exam ?Vitals reviewed.  ?Constitutional:   ?   General: He is not in acute distress. ?   Appearance: Normal appearance. He is well-developed. He is not ill-appearing.  ?HENT:  ?   Head: Normocephalic and atraumatic.  ?Eyes:  ?   Extraocular Movements: Extraocular movements intact.  ?   Conjunctiva/sclera: Conjunctivae normal.  ?   Pupils: Pupils are equal, round, and reactive to light.  ?Neck:  ?   Thyroid: No thyromegaly.  ?Cardiovascular:  ?   Rate and Rhythm: Normal rate and regular rhythm.  ?   Pulses: Normal pulses.  ?   Heart sounds: Normal heart sounds. No murmur heard. ?Pulmonary:  ?   Effort: Pulmonary effort is normal. No respiratory distress.  ?   Comments: Decreased BS diffusely ?Abdominal:  ?   General: Bowel sounds are normal. There is no distension.  ?   Palpations: Abdomen is soft.  ?Musculoskeletal:  ?   Cervical back: Normal range of motion and neck supple.  ?   Right lower leg: No edema.  ?   Left lower leg: No edema.  ?Lymphadenopathy:  ?   Cervical: No cervical adenopathy.  ?Skin: ?   General: Skin is warm and dry.  ?Neurological:  ?   General: No focal deficit present.  ?   Mental Status: He is alert and oriented to person, place, and time.  ?   Cranial Nerves: No cranial nerve deficit.  ?Psychiatric:     ?   Mood and Affect: Mood normal.     ?   Behavior: Behavior normal.  ? ? ? ? ? ?   ?Assessment & Plan:  ? ? ?

## 2021-05-21 NOTE — Progress Notes (Signed)
Chronic Care Management Pharmacy Assistant   Name: [name]  MRN: [id number] DOB: [birth year]    Reason for Encounter: Disease State - General Adherence Call     Recent office visits:  None noted.   Recent consult visits:  04/06/21 Joel Males MD - IPF - ECHO and Pulmonary function test ordered.   04/05/21 Joel Parrett NP - Pulmonology - Medication monitoring - Labs were ordered.  Budeson-Glycopyrrol-Formoterol (BREZTRI AEROSPHERE) 160-9-4.8 MCG/ACT AERO and levalbuterol (XOPENEX HFA) 45 MCG/ACT inhaler prescribed. Continue pulmonary rehab. Follow up in 4 weeks.   03/14/21 [name] - Pulmnology - continue Esbriet at 2 pills three times per day. Follow up as scheduled.   03/09/21 Joel Males MD - Pulmonology - Continue current regimen. Follow up in 4 weeks.   02/16/21 Joel Axe MD - Cardiology - Ischemic cardiomyopathy - No notes available.    Hospital visits:  None in previous 6 months  Medications: Outpatient Encounter Medications as of 05/21/2021  Medication Sig Note   antiseptic oral rinse (BIOTENE) LIQD 15 mLs by Mouth Rinse route in the morning and at bedtime.    Budeson-Glycopyrrol-Formoterol (BREZTRI AEROSPHERE) 160-9-4.8 MCG/ACT AERO Inhale 2 puffs into the lungs in the morning and at bedtime.    carvedilol (COREG) 3.125 MG tablet Take 1 tablet (3.125 mg total) by mouth 2 (two) times daily with a meal.    cholecalciferol (VITAMIN D) 1000 units tablet Take 1,000 Units by mouth every evening.    empagliflozin (JARDIANCE) 10 MG TABS tablet Take 1 tablet (10 mg total) by mouth daily before breakfast.    ENTRESTO 24-26 MG TAKE 1 TABLET BY MOUTH TWICE DAILY    eplerenone (INSPRA) 25 MG tablet TAKE 1 TABLET(25 MG) BY MOUTH DAILY    furosemide (LASIX) 40 MG tablet TAKE 2 TABLETS BY MOUTH EVERY DAY    Ginger, Zingiber officinalis, (GINGER ROOT) 550 MG CAPS Take by mouth.    ipratropium (ATROVENT) 0.06 % nasal spray Place 2 sprays into both nostrils 3 (three)  times daily.    levalbuterol (XOPENEX HFA) 45 MCG/ACT inhaler Inhale 2 puffs into the lungs every 4 (four) hours as needed for wheezing.    loratadine (CLARITIN) 10 MG tablet Take 10 mg by mouth in the morning.    Magnesium 250 MG TABS Take 250 mg by mouth every evening.    mexiletine (MEXITIL) 150 MG capsule TAKE 2 CAPSULES(300 MG) BY MOUTH TWICE DAILY    Multiple Vitamin (MULTIVITAMIN WITH MINERALS) TABS Take 1 tablet by mouth every evening.    nitroGLYCERIN (NITROSTAT) 0.4 MG SL tablet DISSOLVE 1 TABLET UNDER THE TONGUE EVERY 5 MINUTES AS NEEDED FOR CHEST PAIN (Patient taking differently: Place 0.4 mg under the tongue every 5 (five) minutes x 3 doses as needed for chest pain.) 02/14/2020: Hasnt used in 10 years   Olopatadine HCl (PATADAY OP) Apply 1 drop to eye daily.    Omeprazole 20 MG TBEC SMARTSIG:1 By Mouth    Pirfenidone (ESBRIET) 267 MG TABS Take 2 tablets (534 mg total) by mouth with breakfast, with lunch, and with evening meal.    polyvinyl alcohol-povidone (HYPOTEARS) 1.4-0.6 % ophthalmic solution Place 1-2 drops into both eyes in the morning and at bedtime.    potassium chloride SA (KLOR-CON M) 20 MEQ tablet Take 1 tablet (20 mEq total) by mouth daily with lunch.    rosuvastatin (CRESTOR) 40 MG tablet TAKE 1 TABLET(40 MG) BY MOUTH EVERY EVENING    Simethicone (GAS-X PO) Take by mouth.  XARELTO 20 MG TABS tablet TAKE 1 TABLET(20 MG) BY MOUTH DAILY WITH SUPPER    No facility-administered encounter medications on file as of 05/21/2021.    Contacted [name] for General Review Call   Chart Review:  Have there been any documented new, changed, or discontinued medications since last visit? No (If yes, include name, dose, frequency, date) Has there been any documented recent hospitalizations or ED visits since last visit with Clinical Pharmacist? No Brief Summary (including medication and/or Diagnosis changes):   Adherence Review:  Does the Clinical Pharmacist Assistant have  access to adherence rates? Yes Adherence rates for STAR metric medications (List medication(s)/day supply/ last 2 fill dates). Adherence rates for medications indicated for disease state being reviewed (List medication(s)/day supply/ last 2 fill dates). Does the patient have >5 day gap between last estimated fill dates for any of the above medications or other medication gaps? No Reason for medication gaps.   Disease State Questions:  Able to connect with Patient? Yes Did patient have any problems with their health recently? No Note problems and Concerns: Have you had any admissions or emergency room visits or worsening of your condition(s) since last visit? No Details of ED visit, hospital visit and/or worsening condition(s): Have you had any visits with new specialists or providers since your last visit? No Explain: Have you had any new health care problem(s) since your last visit? No New problem(s) reported: Have you run out of any of your medications since you last spoke with clinical pharmacist? No What caused you to run out of your medications? Are there any medications you are not taking as prescribed? No What kept you from taking your medications as prescribed? Are you having any issues or side effects with your medications? No Note of issues or side effects: Do you have any other health concerns or questions you want to discuss with your Clinical Pharmacist before your next visit? No Note additional concerns and questions from Patient. Are there any health concerns that you feel we can do a better job addressing? No Note Patient's response. Are you having any problems with any of the following since the last visit: (select all that apply)  None  Details: 12. Any falls since last visit? No  Details: 13. Any increased or uncontrolled pain since last visit? No  Details: 14. Next visit Type: office       Visit with: [name] PCP        Date: 05/21/21 (today)        Time:  2:20pm  22. Additional Details: Patient states he is doing well and is still in Pulmonary rehab. Has follow up with PCP today.    Care Gaps   AWV: done 11/01/2019 (overdue) Colonoscopy:  due 11/06/21 DM Eye Exam: N/A DM Foot Exam: N/A Microalbumin: unknown  HbgAIC: done 11/17/20 (6.0) DEXA: N/A Mammogram: N/A     Star Rating Drugs: Rosuvastatin (CRESTOR) 40 MG tablet - last filled 03/16/21 90 days    Future Appointments  Date Time Provider [place]  05/21/2021  2:20 PM [name], MD [facility]  05/28/2021  3:00 PM [facility]-ECHO/VAS [facility]  06/06/2021  3:30 PM Deboraha Sprang, MD [facility]  06/20/2021 10:40 AM [name], MD [facility]-HIGHPT None  07/04/2021  3:00 PM [facility] SF-CCM CARE MGR [facility]  08/03/2021  3:00 PM [facility]-PFT RM [facility] None  08/03/2021  3:30 PM Joel Males, MD [facility] None  08/17/2021  7:00 AM [facility]CHURCH DEVICE REMOTES [facility]  [facility]  08/23/2021  9:00 AM [facility]-CCM PHARMACIST [facility]     [name], [place] Clinical Pharmacist Assistant  [phone]

## 2021-05-21 NOTE — Patient Instructions (Signed)
Follow up in 6 months to recheck BP and cholesterol ?We'll notify you of your lab results and make any changes if needed ?Keep up the good work on healthy diet and regular walking- you look great!!! ?Call with any questions or concerns ?Stay Safe!  Stay Healthy! ?Have a great summer!!! ?

## 2021-05-22 LAB — CBC WITH DIFFERENTIAL/PLATELET
Basophils Absolute: 0 10*3/uL (ref 0.0–0.1)
Basophils Relative: 0.4 % (ref 0.0–3.0)
Eosinophils Absolute: 0.3 10*3/uL (ref 0.0–0.7)
Eosinophils Relative: 3.4 % (ref 0.0–5.0)
HCT: 46.4 % (ref 39.0–52.0)
Hemoglobin: 15.7 g/dL (ref 13.0–17.0)
Lymphocytes Relative: 22.5 % (ref 12.0–46.0)
Lymphs Abs: 1.7 10*3/uL (ref 0.7–4.0)
MCHC: 33.8 g/dL (ref 30.0–36.0)
MCV: 104 fl — ABNORMAL HIGH (ref 78.0–100.0)
Monocytes Absolute: 0.5 10*3/uL (ref 0.1–1.0)
Monocytes Relative: 7.1 % (ref 3.0–12.0)
Neutro Abs: 5.2 10*3/uL (ref 1.4–7.7)
Neutrophils Relative %: 66.6 % (ref 43.0–77.0)
Platelets: 286 10*3/uL (ref 150.0–400.0)
RBC: 4.47 Mil/uL (ref 4.22–5.81)
RDW: 14.3 % (ref 11.5–15.5)
WBC: 7.7 10*3/uL (ref 4.0–10.5)

## 2021-05-22 LAB — BASIC METABOLIC PANEL
BUN: 16 mg/dL (ref 6–23)
CO2: 30 mEq/L (ref 19–32)
Calcium: 8.9 mg/dL (ref 8.4–10.5)
Chloride: 100 mEq/L (ref 96–112)
Creatinine, Ser: 1.01 mg/dL (ref 0.40–1.50)
GFR: 70.66 mL/min (ref >60.00)
Glucose, Bld: 100 mg/dL — ABNORMAL HIGH (ref 70–99)
Potassium: 3.9 mEq/L (ref 3.5–5.1)
Sodium: 138 mEq/L (ref 135–145)

## 2021-05-22 LAB — HEPATIC FUNCTION PANEL
ALT: 26 U/L (ref 0–53)
AST: 24 U/L (ref 0–37)
Albumin: 3.9 g/dL (ref 3.5–5.2)
Alkaline Phosphatase: 57 U/L (ref 39–117)
Bilirubin, Direct: 0.1 mg/dL (ref 0.0–0.3)
Total Bilirubin: 0.6 mg/dL (ref 0.2–1.2)
Total Protein: 6.4 g/dL (ref 6.0–8.3)

## 2021-05-22 LAB — LIPID PANEL
Cholesterol: 140 mg/dL (ref 0–200)
HDL: 39.2 mg/dL (ref >39.00)
LDL Cholesterol: 80 mg/dL (ref 0–99)
NonHDL: 100.34
Total CHOL/HDL Ratio: 4
Triglycerides: 101 mg/dL (ref 0.0–149.0)
VLDL: 20.2 mg/dL (ref 0.0–40.0)

## 2021-05-22 LAB — TSH: TSH: 0.88 u[IU]/mL (ref 0.35–5.50)

## 2021-05-23 ENCOUNTER — Telehealth: Payer: Self-pay

## 2021-05-23 NOTE — Telephone Encounter (Signed)
-----  Message from [name], MD sent at 05/22/2021  5:02 PM EDT ----- ?Labs look great!  Sugar is excellent!  No changes at this time ?

## 2021-05-23 NOTE — Telephone Encounter (Signed)
Patient called back. Gave message below, pt verbalized understanding and had no questions.  ?

## 2021-05-28 ENCOUNTER — Ambulatory Visit (INDEPENDENT_AMBULATORY_CARE_PROVIDER_SITE_OTHER): Payer: Medicare Other

## 2021-05-28 DIAGNOSIS — I5022 Chronic systolic (congestive) heart failure: Secondary | ICD-10-CM | POA: Diagnosis not present

## 2021-05-31 NOTE — Progress Notes (Signed)
Remote ICD transmission.   

## 2021-06-01 LAB — ECHOCARDIOGRAM COMPLETE
AR max vel: 1.19 cm2
AV Area VTI: 0.98 cm2
AV Area mean vel: 0.89 cm2
AV Mean grad: 3 mmHg
AV Peak grad: 5 mmHg
Ao pk vel: 1.12 m/s
Area-P 1/2: 3.81 cm2
MV M vel: 4.63 m/s
MV Peak grad: 85.7 mmHg
Radius: 0.9 cm
S' Lateral: 5.63 cm
Single Plane A4C EF: 34.5 %

## 2021-06-06 ENCOUNTER — Other Ambulatory Visit: Payer: Self-pay | Admitting: Cardiology

## 2021-06-06 ENCOUNTER — Encounter: Payer: Self-pay | Admitting: Internal Medicine

## 2021-06-06 ENCOUNTER — Ambulatory Visit (INDEPENDENT_AMBULATORY_CARE_PROVIDER_SITE_OTHER): Payer: Medicare Other | Admitting: Internal Medicine

## 2021-06-06 ENCOUNTER — Telehealth: Payer: Self-pay | Admitting: Internal Medicine

## 2021-06-06 VITALS — BP 106/68 | HR 72 | Ht 70.0 in | Wt 193.0 lb

## 2021-06-06 DIAGNOSIS — I4729 Other ventricular tachycardia: Secondary | ICD-10-CM

## 2021-06-06 DIAGNOSIS — I5022 Chronic systolic (congestive) heart failure: Secondary | ICD-10-CM | POA: Diagnosis not present

## 2021-06-06 DIAGNOSIS — I4821 Permanent atrial fibrillation: Secondary | ICD-10-CM | POA: Diagnosis not present

## 2021-06-06 DIAGNOSIS — I255 Ischemic cardiomyopathy: Secondary | ICD-10-CM

## 2021-06-06 DIAGNOSIS — Z95810 Presence of automatic (implantable) cardiac defibrillator: Secondary | ICD-10-CM | POA: Diagnosis not present

## 2021-06-06 NOTE — Telephone Encounter (Signed)
   ECHO shows ef 30-35% with moderate- severe mitral and tricuspid regurgitation. According to report ejection fraction no change from prior in 2021 but mitral valve might be more leaky. Recommend he take appt with his cardiologist

## 2021-06-06 NOTE — Patient Instructions (Signed)
Medication Instructions:  Your physician recommends that you continue on your current medications as directed. Please refer to the Current Medication list given to you today.  *If you need a refill on your cardiac medications before your next appointment, please call your pharmacy*   Lab Work: None ordered.  If you have labs (blood work) drawn today and your tests are completely normal, you will receive your results only by: [place] (if you have MyChart) OR A paper copy in the mail If you have any lab test that is abnormal or we need to change your treatment, we will call you to review the results.   Testing/Procedures: None ordered.    Follow-Up: At [facility], you and your health needs are our priority.  As part of our continuing mission to provide you with exceptional heart care, we have created designated Provider Care Teams.  These Care Teams include your primary Cardiologist (physician) and Advanced Practice Providers (APPs -  Physician Assistants and Nurse Practitioners) who all work together to provide you with the care you need, when you need it.  We recommend signing up for the patient portal called "MyChart".  Sign up information is provided on this After Visit Summary.  MyChart is used to connect with patients for Virtual Visits (Telemedicine).  Patients are able to view lab/test results, encounter notes, upcoming appointments, etc.  Non-urgent messages can be sent to your provider as well.   To learn more about what you can do with MyChart, go to [email or web address].    Your next appointment:   12 months with Dr Caryl Comes  Important Information About Sugar

## 2021-06-06 NOTE — Progress Notes (Signed)
Patient Care Team: [name], MD as PCP - General (Family Medicine) Festus Aloe, MD as Consulting Physician (Urology) Michael Boston, MD as Consulting Physician (General Surgery) Stanford Breed Denice Bors, MD as Consulting Physician (Cardiology) Jodi Marble, MD as Consulting Physician (Otolaryngology) Deboraha Sprang, MD as Consulting Physician (Cardiology) Luberta Mutter, MD as Consulting Physician (Ophthalmology) Lamonte Sakai Rose Fillers, MD as Consulting Physician (Pulmonary Disease) Mauri Pole, MD as Consulting Physician (Gastroenterology) [name], [facility] (Pharmacist) Dimitri Ped, RN as Case Manager   HPI  CC atrial fibrillation  [name] is a 81 y.o. male   in the setting of ischemic heart disease and previously implanted CRT-D with subsequent left ventricular lead macro dislodgment. His symptoms however been quite stable. When he saw Dr. Lovena Le concerning lead extraction and lead implantation he was doing as well as he had for some time and it was decided not to pursue it then. Ultimately this was done  2013  He also has ventricular tachycardia for which he took amiodarone.   required adjunctive mexiletine. LFTs were abnormal and his Crestor was stopped.  Subsequently normalized even on amiodarone.  Amiodarone stopped spring 2019 because of issues of lung toxicity   He also has atrial fibrillation and is taking Xarelto   recurrent atrial fibrillation prompted cardioversion 4/22   Reverted to Atrial fib at this visit.  Dual diagnosis of interstitial pulmonary fibrosis with a prognosis that he understands to be in the 3 year range.  The medications which he is taking makes him feel terrible.  Because of worsening shortness of breath he was referred for an echocardiogram which demonstrated worsening MR and LA enlargement although LV sizes were somewhat smaller (see below  His weight is down about 20 pounds.  No chest pain.  No edema.       DATE TEST EF   10/08 Cath    20 % T RCA  12/13 Cath  <20% T RCA   1/17 Echo    20-25 %   6/21 Echo  30-35% MR mild LAE 41 mm/m2 LV  ID 6.4/7.6  4/22 Myoview  16% Large scar; no ischemia  /23 Echo  30-35% MR mod-severe LAE severe  73 mm/m2 LV ID 5.6/6.5 cm       Date Cr K Hgb TSH PLCO LFTs LDL  2008     80%    11/17     1.91 56%(3/17) 31    6/18 0.95 4.0 15.2 2.19  30 162  5/19 0.95 4.0         10/20 0.87 4.1 14.2 1.54  18   1/22 1.0 4.1 15.5      5/223 1.01 3.9 15.7 0.88        Thromboembolic risk factors ( age -23, HTN-1, Vasc disease -1, CHF-1) for a CHADSVASc Score of  >= 5    Past Medical History:  Diagnosis Date   Adenomatous colon polyp 2009; 06/2012   Colonoscopy 07/2007---Dr. Barron Schmid GI--Repeat 06/2012 showed one tubular adenoma w/out high grade dysplasia.   AICD (automatic cardioverter/defibrillator) present    Chemical engineer   Atrial fibrillation ([place])    Bilateral sensorineural hearing loss    BPH (benign prostatic hypertrophy)    PSAs ok (followed by urologist): no LUTS   CAD (coronary artery disease)    PTCA RCA 1997 (Inf/post MI);   CHF (congestive heart failure) (HCC)    Chronic constipation    Chronic rhinitis    ?vasomotor (  Dr. Erik Obey)   COPD (chronic obstructive pulmonary disease) ([place])    Diverticulosis of colon 2005   Noted on Colonoscopies in 2005 and 2009 and on CT 2010   Gallstones 06/2014   No evidence of cholecystitis on abd u/s 06/2014   GERD (gastroesophageal reflux disease)    Hepatic steatosis 2010; 2016   Noted on CT abd 2010 and on u/s abd 04/2009 (transaminasemia) and u/s 06/2014.   Hyperlipidemia    Hypertension    Ischemic cardiomyopathy    Microscopic hematuria    Cysto neg 02/2009 except for BPH-likely has microhem from his renal stone dz   Nephrolithiasis    No distal stones on CT 2010; stable 8 mm RLP stone, 71m LMP stone 10/2013 plain film.  Abd u/s 06/2014: 9 mm nonobstructing lower pole right renal stone.  03/2015  urol: stable nonobstructing renal calc by CT 12/2014 and KUB 03/2015.   Obstructive sleep apnea    no cpap uses since weight loss of 50 pounds   Presence of permanent cardiac pacemaker    [employer]   Squamous cell carcinoma of back    Ventricular tachycardia ([facility]     Past Surgical History:  Procedure Laterality Date   ATRIAL ABLATION SURGERY     flutter ablation 2002   BREAST SURGERY Left 1990   "Tissue removed from breast"   [place]   Procedure: CARDIOVERSION;  Surgeon: CSanda Dezi Schaner MD;  [facility];  Service: Cardiovascular;  Laterality: N/A;   carotid doppler  09/25/12   No signif stenosis   COLONOSCOPY N/A 07/13/2012   Diverticula ascending colon.  Polyp x 1-recall 5 yrs (Dr. MWatt Climes Procedure: COLONOSCOPY;  Surgeon: MJeryl Columbia MD;  [facility];  Service: Endoscopy;  Laterality: N/A; polypectomy x 1    COLONOSCOPY WITH PROPOFOL N/A 08/18/2015   Procedure: COLONOSCOPY WITH PROPOFOL;  Surgeon: KMauri Pole MD;  Location: [facility];  Service: Endoscopy;  Laterality: N/A;   ESOPHAGOGASTRODUODENOSCOPY N/A 07/13/2012   Procedure: ESOPHAGOGASTRODUODENOSCOPY (EGD);  Surgeon: MJeryl Columbia MD;  [facility];  Service: Endoscopy;  Laterality: N/A;  NORMAL   EXTRACORPOREAL SHOCK WAVE LITHOTRIPSY     HEMORRHOID BANDING     ICD     a) Guidant Contak H170- SVirl Axe MD 07/25/06 (second device) b) Medtronic CRT-D   ICD GENERATOR CHANGEOUT N/A 02/14/2020   Procedure: ICD GENERATOR CHANGEOUT;  Surgeon: KDeboraha Sprang MD;  [facility];  Service: Cardiovascular;  Laterality: N/A;   INGUINAL HERNIA REPAIR Bilateral 1975   LEAD REVISION Bilateral 12/26/2011   Procedure: LEAD REVISION;  Surgeon: GEvans Lance MD;  [facility];  Service: Cardiovascular;  Laterality: Bilateral;   PFTs  02/2015   Mod restriction (interstitial), mod diffusion defect (Dr. YAnnamaria Boots   precancer  area keratsis removed  05/2015    leftf orehead and left arm and right arm and back    Current Outpatient Medications  Medication Sig Dispense Refill   antiseptic oral rinse (BIOTENE) LIQD 15 mLs by Mouth Rinse route in the morning and at bedtime.     Budeson-Glycopyrrol-Formoterol (BREZTRI AEROSPHERE) 160-9-4.8 MCG/ACT AERO Inhale 2 puffs into the lungs in the morning and at bedtime. 10.7 g 6   carvedilol (COREG) 3.125 MG tablet Take 1 tablet (3.125 mg total) by mouth 2 (two) times daily with a meal. 180 tablet 3   cholecalciferol (VITAMIN D) 1000 units tablet Take 1,000 Units by mouth  every evening.     empagliflozin (JARDIANCE) 10 MG TABS tablet Take 1 tablet (10 mg total) by mouth daily before breakfast. 90 tablet 3   ENTRESTO 24-26 MG TAKE 1 TABLET BY MOUTH TWICE DAILY 180 tablet 3   eplerenone (INSPRA) 25 MG tablet TAKE 1 TABLET(25 MG) BY MOUTH DAILY 30 tablet 11   furosemide (LASIX) 40 MG tablet TAKE 2 TABLETS BY MOUTH EVERY DAY 90 tablet 3   Ginger, Zingiber officinalis, (GINGER ROOT) 550 MG CAPS Take 1 capsule by mouth daily.     ipratropium (ATROVENT) 0.06 % nasal spray Place 2 sprays into both nostrils 3 (three) times daily. 15 mL 1   levalbuterol (XOPENEX HFA) 45 MCG/ACT inhaler Inhale 2 puffs into the lungs every 4 (four) hours as needed for wheezing. 1 each 4   loratadine (CLARITIN) 10 MG tablet Take 10 mg by mouth in the morning.     Magnesium 250 MG TABS Take 250 mg by mouth every evening.     mexiletine (MEXITIL) 150 MG capsule TAKE 2 CAPSULES(300 MG) BY MOUTH TWICE DAILY 360 capsule 2   Multiple Vitamin (MULTIVITAMIN WITH MINERALS) TABS Take 1 tablet by mouth every evening.     nitroGLYCERIN (NITROSTAT) 0.4 MG SL tablet DISSOLVE 1 TABLET UNDER THE TONGUE EVERY 5 MINUTES AS NEEDED FOR CHEST PAIN (Patient taking differently: Place 0.4 mg under the tongue every 5 (five) minutes x 3 doses as needed for chest pain.) 25 tablet 5   Olopatadine HCl (PATADAY OP) Apply 1 drop to eye  daily.     Omeprazole 20 MG TBEC Take 1 tablet by mouth daily.     Pirfenidone (ESBRIET) 267 MG TABS Take 2 tablets (534 mg total) by mouth with breakfast, with lunch, and with evening meal. 180 tablet 5   polyvinyl alcohol-povidone (HYPOTEARS) 1.4-0.6 % ophthalmic solution Place 1-2 drops into both eyes in the morning and at bedtime.     potassium chloride SA (KLOR-CON M) 20 MEQ tablet Take 1 tablet (20 mEq total) by mouth daily with lunch. 90 tablet 3   rosuvastatin (CRESTOR) 40 MG tablet TAKE 1 TABLET(40 MG) BY MOUTH EVERY EVENING 90 tablet 0   Simethicone (GAS-X PO) Take by mouth as directed.     XARELTO 20 MG TABS tablet TAKE 1 TABLET(20 MG) BY MOUTH DAILY WITH SUPPER 90 tablet 1   No current facility-administered medications for this visit.    Allergies  Allergen Reactions   Sulfa Antibiotics Rash    Review of Systems negative except from HPI and PMH  Physical Exam BP 106/68   Pulse 72   Ht _0  (1.778 m)   Wt 193 lb (87.5 kg)   SpO2 97%   BMI 27.69 kg/m  Well developed and well nourished in no acute distress HENT normal Neck supple with JVP-flat Diffuse crackles Device pocket well healed; without hematoma or erythema.  There is no tethering  Regular rate and rhythm, no murmur Abd-soft with active BS No Clubbing cyanosis   edema Skin-warm and dry A & Oriented  Grossly normal sensory and motor function  ECG atrial fibrillation with biventricular pacing with an upright QRS lead V1 negative QRS lead I \QRSd 194 ms Assessment and  Plan   Ventricular tachycardia  Ischemic and nonischemic cardiomyopathy  Congestive heart failure-chronic-systolic  CRT-D-[employer]         COPD pulm fibrosis  Atrial fibrillation-permanent  Atrial fibrillation is permanent.  Continue anticoagulation with Xarelto 20 mg daily.  No interval ventricular  tachycardia.  We will continue him on mexiletine 150 twice daily.  Discussed his echocardiogram with worsening mitral  regurgitation.  He is to see Dr. Cherylann Ratel next week.  Will defer further management to him.  I could not find any literature to suggest that the pirfenidone caused mitral valve disease.  He is quite despondent and trying to sort out quality of life versus quantity of life.  His wife is a committed believer, he not.

## 2021-06-06 NOTE — Telephone Encounter (Signed)
Called and spoke with pt letting him know the results of echo and recs per MR and he verbalized understanding. Nothing further needed.

## 2021-06-07 DIAGNOSIS — L570 Actinic keratosis: Secondary | ICD-10-CM | POA: Diagnosis not present

## 2021-06-07 DIAGNOSIS — L821 Other seborrheic keratosis: Secondary | ICD-10-CM | POA: Diagnosis not present

## 2021-06-07 DIAGNOSIS — Z85828 Personal history of other malignant neoplasm of skin: Secondary | ICD-10-CM | POA: Diagnosis not present

## 2021-06-07 DIAGNOSIS — L813 Cafe au lait spots: Secondary | ICD-10-CM | POA: Diagnosis not present

## 2021-06-07 DIAGNOSIS — D1801 Hemangioma of skin and subcutaneous tissue: Secondary | ICD-10-CM | POA: Diagnosis not present

## 2021-06-07 DIAGNOSIS — D692 Other nonthrombocytopenic purpura: Secondary | ICD-10-CM | POA: Diagnosis not present

## 2021-06-07 DIAGNOSIS — L723 Sebaceous cyst: Secondary | ICD-10-CM | POA: Diagnosis not present

## 2021-06-08 ENCOUNTER — Other Ambulatory Visit: Payer: Self-pay | Admitting: Cardiology

## 2021-06-13 NOTE — Progress Notes (Signed)
HPI: FU history of coronary artery disease, mixed ischemic and nonischemic cardiomyopathy, history of ICD, paroxysmal atrial fibrillation, and ventricular tachycardia. Previous abdominal ultrasound in April 2006 showed no aneurysm. Last cardiac catheterization in October 2008 showed an occluded right coronary artery with left-to-right collateralization. Ejection fraction is 20%. There was no other coronary disease noted. ABIs in December of 2011 were normal. Previous carotid Dopplers in Sept 2014 showed 0-39% stenosis bilaterally. The patient has had upgrade of his device to CRT-D. Patient had revision of his left ventricular lead in December 2013. Amiodarone discontinued March 2019 because of potential pulmonary toxicity. Nuclear study April 2022 showed ejection fraction 16%, large scar with no significant ischemia. Pt underwent DCCV 05/10/20.  At follow-up office visit patient was in recurrent atrial fibrillation and now felt to be permanent. Chest CT September 2022 showed interstitial lung disease (likely UIP), coronary calcification and aortic valve calcification. Echocardiogram May 2023 showed ejection fraction 30 to 32%, grade 1 diastolic dysfunction, severe left atrial enlargement, moderate to severe mitral regurgitation, moderate tricuspid regurgitation.  Since last seen, he has had problems with weight loss, decreased appetite and diarrhea that he attributes to esbriet.  He has dyspnea with more vigorous activities but not routine activities.  No orthopnea, PND, pedal edema, chest pain or syncope.  Current Outpatient Medications  Medication Sig Dispense Refill   antiseptic oral rinse (BIOTENE) LIQD 15 mLs by Mouth Rinse route in the morning and at bedtime.     Budeson-Glycopyrrol-Formoterol (BREZTRI AEROSPHERE) 160-9-4.8 MCG/ACT AERO Inhale 2 puffs into the lungs in the morning and at bedtime. 10.7 g 6   carvedilol (COREG) 3.125 MG tablet Take 1 tablet (3.125 mg total) by mouth 2 (two) times  daily with a meal. 180 tablet 3   cholecalciferol (VITAMIN D) 1000 units tablet Take 1,000 Units by mouth every evening.     empagliflozin (JARDIANCE) 10 MG TABS tablet Take 1 tablet (10 mg total) by mouth daily before breakfast. 90 tablet 3   ENTRESTO 24-26 MG TAKE 1 TABLET BY MOUTH TWICE DAILY 180 tablet 3   eplerenone (INSPRA) 25 MG tablet TAKE 1 TABLET(25 MG) BY MOUTH DAILY 30 tablet 11   furosemide (LASIX) 40 MG tablet TAKE 2 TABLETS BY MOUTH EVERY DAY 90 tablet 3   Ginger, Zingiber officinalis, (GINGER ROOT) 550 MG CAPS Take 1 capsule by mouth daily.     ipratropium (ATROVENT) 0.06 % nasal spray Place 2 sprays into both nostrils 3 (three) times daily. 15 mL 1   levalbuterol (XOPENEX HFA) 45 MCG/ACT inhaler Inhale 2 puffs into the lungs every 4 (four) hours as needed for wheezing. 1 each 4   loratadine (CLARITIN) 10 MG tablet Take 10 mg by mouth in the morning.     Magnesium 250 MG TABS Take 250 mg by mouth every evening.     mexiletine (MEXITIL) 150 MG capsule TAKE 2 CAPSULES(300 MG) BY MOUTH TWICE DAILY 360 capsule 2   Multiple Vitamin (MULTIVITAMIN WITH MINERALS) TABS Take 1 tablet by mouth every evening.     nitroGLYCERIN (NITROSTAT) 0.4 MG SL tablet DISSOLVE 1 TABLET UNDER THE TONGUE EVERY 5 MINUTES AS NEEDED FOR CHEST PAIN (Patient taking differently: Place 0.4 mg under the tongue every 5 (five) minutes x 3 doses as needed for chest pain.) 25 tablet 5   Olopatadine HCl (PATADAY OP) Apply 1 drop to eye daily.     Omeprazole 20 MG TBEC Take 1 tablet by mouth daily.     Pirfenidone (  ESBRIET) 267 MG TABS Take 2 tablets (534 mg total) by mouth with breakfast, with lunch, and with evening meal. 180 tablet 5   polyvinyl alcohol-povidone (HYPOTEARS) 1.4-0.6 % ophthalmic solution Place 1-2 drops into both eyes in the morning and at bedtime.     potassium chloride SA (KLOR-CON M) 20 MEQ tablet Take 1 tablet (20 mEq total) by mouth daily with lunch. 90 tablet 3   rosuvastatin (CRESTOR) 40 MG  tablet TAKE 1 TABLET(40 MG) BY MOUTH EVERY EVENING 90 tablet 0   Simethicone (GAS-X PO) Take by mouth as directed.     XARELTO 20 MG TABS tablet TAKE 1 TABLET(20 MG) BY MOUTH DAILY WITH SUPPER 90 tablet 1   No current facility-administered medications for this visit.     Past Medical History:  Diagnosis Date   Adenomatous colon polyp 2009; 06/2012   Colonoscopy 07/2007---Dr. Barron Schmid [facility]--Repeat 06/2012 showed one tubular adenoma w/out high grade dysplasia.   AICD (automatic cardioverter/defibrillator) present    Chemical engineer   Atrial fibrillation ([place])    Bilateral sensorineural hearing loss    BPH (benign prostatic hypertrophy)    PSAs ok (followed by urologist): no LUTS   CAD (coronary artery disease)    PTCA RCA 1997 (Inf/post MI);   CHF (congestive heart failure) ([place])    Chronic constipation    Chronic rhinitis    ?vasomotor (Dr. Erik Obey)   COPD (chronic obstructive pulmonary disease) ([place])    Diverticulosis of colon 2005   Noted on Colonoscopies in 2005 and 2009 and on CT 2010   Gallstones 06/2014   No evidence of cholecystitis on abd u/s 06/2014   GERD (gastroesophageal reflux disease)    Hepatic steatosis 2010; 2016   Noted on CT abd 2010 and on u/s abd 04/2009 (transaminasemia) and u/s 06/2014.   Hyperlipidemia    Hypertension    Ischemic cardiomyopathy    Microscopic hematuria    Cysto neg 02/2009 except for BPH-likely has microhem from his renal stone dz   Nephrolithiasis    No distal stones on CT 2010; stable 8 mm RLP stone, 13m LMP stone 10/2013 plain film.  Abd u/s 06/2014: 9 mm nonobstructing lower pole right renal stone.  03/2015 urol: stable nonobstructing renal calc by CT 12/2014 and KUB 03/2015.   Obstructive sleep apnea    no cpap uses since weight loss of 50 pounds   Presence of permanent cardiac pacemaker    [employer]   Squamous cell carcinoma of back    Ventricular tachycardia ([facility]     Past Surgical History:  Procedure Laterality Date    ATRIAL ABLATION SURGERY     flutter ablation 2002   BREAST SURGERY Left 1990   "Tissue removed from breast"   [place] 05/10/[place]   Procedure: CARDIOVERSION;  Surgeon: CSanda Klein MD;  [facility];  Service: Cardiovascular;  Laterality: N/A;   carotid doppler  09/25/12   No signif stenosis   COLONOSCOPY N/A 07/13/2012   Diverticula ascending colon.  Polyp x 1-recall 5 yrs (Dr. MWatt Climes Procedure: COLONOSCOPY;  Surgeon: MJeryl Columbia MD;  Location: [facility];  Service: Endoscopy;  Laterality: N/A; polypectomy x 1    COLONOSCOPY WITH PROPOFOL N/A 08/18/2015   Procedure: COLONOSCOPY WITH PROPOFOL;  Surgeon: KMauri Pole MD;  Location: [facility];  Service: Endoscopy;  Laterality: N/A;   ESOPHAGOGASTRODUODENOSCOPY N/A 07/13/2012   Procedure: ESOPHAGOGASTRODUODENOSCOPY (EGD);  Surgeon: MJeryl Columbia MD;  Location: [facility];  Service: Endoscopy;  Laterality: N/A;  NORMAL   EXTRACORPOREAL SHOCK WAVE LITHOTRIPSY     HEMORRHOID BANDING     ICD     a) Guidant Contak H170- Virl Axe, MD 07/25/06 (second device) b) Medtronic CRT-D   ICD GENERATOR CHANGEOUT N/A 02/14/2020   Procedure: ICD GENERATOR CHANGEOUT;  Surgeon: Deboraha Sprang, MD;  Location: [facility];  Service: Cardiovascular;  Laterality: N/A;   INGUINAL HERNIA REPAIR Bilateral 1975   LEAD REVISION Bilateral 12/26/2011   Procedure: LEAD REVISION;  Surgeon: Evans Lance, MD;  Location: [facility];  Service: Cardiovascular;  Laterality: Bilateral;   PFTs  02/2015   Mod restriction (interstitial), mod diffusion defect (Dr. Annamaria Boots)   precancer area keratsis removed  05/2015    leftf [place] and left arm and right arm and back    Social History   Socioeconomic History   Marital status: Married    Spouse name: Not on file   Number of children: 3   Years of education: 16   Highest education level: Bachelor's degree (e.g., BA, AB, BS)  Occupational History    Occupation: Retired    Fish farm manager: RETIRED    Comment: Nurse, children's  Tobacco Use   Smoking status: Former    Packs/day: 2.00    Years: 15.00    Pack years: 30.00    Types: Cigarettes    Quit date: 01/15/1980    Years since quitting: 41.4   Smokeless tobacco: Never  Vaping Use   Vaping Use: Never used  Substance and Sexual Activity   Alcohol use: No    Alcohol/week: 0.0 standard drinks   Drug use: No   Sexual activity: Yes  Other Topics Concern   Not on file  Social History Narrative   Married.   Hx of cigarettes: quit in the 1980s.  No alc/drugs.   Social Determinants of Health   Financial Resource Strain: Low Risk    Difficulty of Paying Living Expenses: Not hard at all  Food Insecurity: No Food Insecurity   Worried About Charity fundraiser in the Last Year: Never true   [place] in the Last Year: Never true  Transportation Needs: No Transportation Needs   Lack of Transportation (Medical): No   Lack of Transportation (Non-Medical): No  Physical Activity: Sufficiently Active   Days of Exercise per Week: 4 days   Minutes of Exercise per Session: 40 min  Stress: No Stress Concern Present   Feeling of Stress : Not at all  Social Connections: Moderately Isolated   Frequency of Communication with Friends and Family: Three times a week   Frequency of Social Gatherings with Friends and Family: More than three times a week   Attends Religious Services: Never   Marine scientist or Organizations: No   Attends Music therapist: Never   Marital Status: Married  Human resources officer Violence: Not At Risk   Fear of Current or Ex-Partner: No   Emotionally Abused: No   Physically Abused: No   Sexually Abused: No    Family History  Problem Relation Age of Onset   Heart disease Mother    Other Mother        colon surgery for fistula   Gallstones Mother    Breast cancer Maternal Grandmother    Coronary artery disease Father        family hx of    Heart attack Father        4 stents/  pacemaker   Nephrolithiasis Father    Hypertension Sister    Goiter Paternal Grandmother    Nephrolithiasis Son    Colon cancer Neg Hx     ROS: no fevers or chills, productive cough, hemoptysis, dysphasia, odynophagia, melena, hematochezia, dysuria, hematuria, rash, seizure activity, orthopnea, PND, pedal edema, claudication. Remaining systems are negative.  Physical Exam: Well-developed well-nourished in no acute distress.  Skin is warm and dry.  HEENT is normal.  Neck is supple.  Chest is clear to auscultation with normal expansion.  Cardiovascular exam is regular rate and rhythm.  2/6 systolic murmur apex. Abdominal exam nontender or distended. No masses palpated. Extremities show no edema. neuro grossly intact  A/P  1 permanent atrial fibrillation-plan to continue carvedilol for rate control.  Continue Xarelto.  2 mixed ischemic/nonischemic cardiomyopathy-plan to continue Entresto and carvedilol at present dose.  3 moderate to severe mitral regurgitation-this is noted on most recent echocardiogram and worse compared to previous.  He will need follow-up studies in the future.  He does not appear to be in congestive heart failure on physical examination and I think his weight loss and decreased appetite are more related to his pulmonary fibrosis medications.  We can consider referral for MitraClip in the future if he develops worsening CHF symptoms.  4 coronary artery disease-he denies chest pain.  Continue statin.  He is not on aspirin given need for Xarelto.  5 chronic systolic congestive heart failure-plan to continue present dose of diuretics as well as jardiance.  6 hypertension-blood pressure controlled today.  Continue present medical regimen.  7 hyperlipidemia-continue statin.  8 history of VT-continue beta-blocker and mexiletine.  Amiodarone discontinued previously due to potential lung toxicity.  9 biventricular ICD-Per  EP.  Kirk Ruths, MD

## 2021-06-20 ENCOUNTER — Ambulatory Visit (INDEPENDENT_AMBULATORY_CARE_PROVIDER_SITE_OTHER): Payer: Medicare Other | Admitting: Cardiology

## 2021-06-20 ENCOUNTER — Encounter: Payer: Self-pay | Admitting: Family Medicine

## 2021-06-20 ENCOUNTER — Encounter: Payer: Self-pay | Admitting: Cardiology

## 2021-06-20 ENCOUNTER — Ambulatory Visit (INDEPENDENT_AMBULATORY_CARE_PROVIDER_SITE_OTHER): Payer: Medicare Other | Admitting: Family Medicine

## 2021-06-20 VITALS — BP 114/60 | HR 89 | Temp 97.9°F | Resp 16 | Ht 70.0 in | Wt 190.1 lb

## 2021-06-20 DIAGNOSIS — I5022 Chronic systolic (congestive) heart failure: Secondary | ICD-10-CM

## 2021-06-20 DIAGNOSIS — I4821 Permanent atrial fibrillation: Secondary | ICD-10-CM

## 2021-06-20 DIAGNOSIS — S51811A Laceration without foreign body of right forearm, initial encounter: Secondary | ICD-10-CM

## 2021-06-20 DIAGNOSIS — I480 Paroxysmal atrial fibrillation: Secondary | ICD-10-CM | POA: Diagnosis not present

## 2021-06-20 DIAGNOSIS — I429 Cardiomyopathy, unspecified: Secondary | ICD-10-CM | POA: Diagnosis not present

## 2021-06-20 DIAGNOSIS — I059 Rheumatic mitral valve disease, unspecified: Secondary | ICD-10-CM

## 2021-06-20 DIAGNOSIS — R0781 Pleurodynia: Secondary | ICD-10-CM

## 2021-06-20 DIAGNOSIS — I4891 Unspecified atrial fibrillation: Secondary | ICD-10-CM | POA: Diagnosis not present

## 2021-06-20 DIAGNOSIS — I255 Ischemic cardiomyopathy: Secondary | ICD-10-CM | POA: Diagnosis not present

## 2021-06-20 DIAGNOSIS — Z95810 Presence of automatic (implantable) cardiac defibrillator: Secondary | ICD-10-CM

## 2021-06-20 MED ORDER — ROSUVASTATIN CALCIUM 40 MG PO TABS: ORAL_TABLET | ORAL | 3 refills | Status: DC

## 2021-06-20 MED ORDER — POTASSIUM CHLORIDE CRYS ER 20 MEQ PO TBCR: 20.0000 meq | EXTENDED_RELEASE_TABLET | Freq: Every day | ORAL | 3 refills | Status: DC

## 2021-06-20 MED ORDER — EPLERENONE 25 MG PO TABS: ORAL_TABLET | ORAL | 3 refills | Status: DC

## 2021-06-20 MED ORDER — EMPAGLIFLOZIN 10 MG PO TABS: 10.0000 mg | ORAL_TABLET | Freq: Every day | ORAL | 3 refills | Status: DC

## 2021-06-20 MED ORDER — OMEPRAZOLE 20 MG PO TBEC: 1.0000 | DELAYED_RELEASE_TABLET | Freq: Every day | ORAL | 3 refills | Status: DC

## 2021-06-20 MED ORDER — FUROSEMIDE 40 MG PO TABS
80.0000 mg | ORAL_TABLET | Freq: Every day | ORAL | 3 refills | Status: DC
Start: 2021-06-20 — End: 2021-11-22

## 2021-06-20 MED ORDER — NITROGLYCERIN 0.4 MG SL SUBL: SUBLINGUAL_TABLET | SUBLINGUAL | 5 refills | Status: DC

## 2021-06-20 MED ORDER — RIVAROXABAN 20 MG PO TABS: ORAL_TABLET | ORAL | 3 refills | Status: DC

## 2021-06-20 MED ORDER — ENTRESTO 24-26 MG PO TABS: 1.0000 | ORAL_TABLET | Freq: Two times a day (BID) | ORAL | 3 refills | Status: DC

## 2021-06-20 NOTE — Patient Instructions (Signed)
  Follow-Up: At [facility], you and your health needs are our priority.  As part of our continuing mission to provide you with exceptional heart care, we have created designated Provider Care Teams.  These Care Teams include your primary Cardiologist (physician) and Advanced Practice Providers (APPs -  Physician Assistants and Nurse Practitioners) who all work together to provide you with the care you need, when you need it.  We recommend signing up for the patient portal called "MyChart".  Sign up information is provided on this After Visit Summary.  MyChart is used to connect with patients for Virtual Visits (Telemedicine).  Patients are able to view lab/test results, encounter notes, upcoming appointments, etc.  Non-urgent messages can be sent to your provider as well.   To learn more about what you can do with MyChart, go to [email or web address].    Your next appointment:   6 month(s)  The format for your next appointment:   In Person  Provider:   Kirk Ruths, MD      Important Information About Sugar

## 2021-06-20 NOTE — Progress Notes (Signed)
   Subjective:    Patient ID: [name], male    DOB: 06/30/1940, 81 y.o.   MRN: [id number]  HPI Skin tear- R forearm.  [place] and when he tried to tape it up, the tape pulled his skin off.  R flank pain- having pain up under R ribs.  Sxs started ~4 months ago while in pulmonary PT.  Pain is episodic and lasts for 'about half a day'.  At times is in the front, can be in the back.  Occurs more frequently w/ activity.  Pain sometimes improves w/ position changes.   Review of Systems For ROS see HPI     Objective:   Physical Exam Vitals reviewed.  Constitutional:      General: He is not in acute distress.    Appearance: Normal appearance. He is not ill-appearing.  HENT:     Head: Normocephalic and atraumatic.  Chest:     Chest wall: Tenderness (TTP over R inferior rib in mid axillary line) present.  Abdominal:     General: Abdomen is flat. There is no distension.     Palpations: Abdomen is soft.     Tenderness: There is no abdominal tenderness. There is no guarding or rebound.  Skin:    General: Skin is warm.     Findings: Bruising (bruising of R forearm surrounding triangular shaped 2 cm skin tear) present.  Neurological:     General: No focal deficit present.     Mental Status: He is alert and oriented to person, place, and time.  Psychiatric:        Mood and Affect: Mood normal.        Behavior: Behavior normal.        Thought Content: Thought content normal.          Assessment & Plan:  Skin tear- new.  Area has extensive bruising around small, irregular skin tag.  Area was cleaned w/ H2O2, patted dry, antibiotic ointment applied, and wound was covered w/ Telfa pad.  Reviewed wound care instructions w/ pt.  No need for abx as area is not infected.  R rib pain- new.  No deformity appreciated on PE.  TTP over false rib cartilaginous insertion.  Suspect this is positional inflammation.  Pt to use Tylenol prn and heating pad for symptom relief.   He is to let me know if sxs change or worsen.  Pt expressed understanding and is in agreement w/ plan.

## 2021-06-20 NOTE — Patient Instructions (Signed)
Follow up as needed or as scheduled Keep the wound clean and dry- once it stops oozing, leave it open to the air I think the pain on the R side is musculoskeletal and it just gets irritated w/ certain movements Use a heating pad if needed for pain Tylenol as needed for pain Call with any questions or concerns Hang in there! Have a great summer!

## 2021-07-04 ENCOUNTER — Ambulatory Visit: Payer: Medicare Other

## 2021-07-04 DIAGNOSIS — I4821 Permanent atrial fibrillation: Secondary | ICD-10-CM

## 2021-07-04 DIAGNOSIS — I10 Essential (primary) hypertension: Secondary | ICD-10-CM

## 2021-07-04 DIAGNOSIS — J449 Chronic obstructive pulmonary disease, unspecified: Secondary | ICD-10-CM

## 2021-07-04 DIAGNOSIS — I5022 Chronic systolic (congestive) heart failure: Secondary | ICD-10-CM

## 2021-07-04 NOTE — Chronic Care Management (AMB) (Cosign Needed)
Chronic Care Management   CCM RN Visit Note  07/04/2021 Name: [name] MRN: [id number] DOB: [birth year]  Subjective: [name] is a 81 y.o. year old male who is a primary care patient of [name], [name], MD. The care management team was consulted for assistance with disease management and care coordination needs.    Engaged with patient by telephone for follow up visit in response to provider referral for case management and/or care coordination services.   Consent to Services:  The patient was given information about Chronic Care Management services, agreed to services, and gave verbal consent prior to initiation of services.  Please see initial visit note for detailed documentation.   Patient agreed to services and verbal consent obtained.   Assessment: Review of patient past medical history, allergies, medications, health status, including review of consultants reports, laboratory and other test data, was performed as part of comprehensive evaluation and provision of chronic care management services.   SDOH (Social Determinants of Health) assessments and interventions performed:    CCM Care Plan  Allergies  Allergen Reactions   Sulfa Antibiotics Rash    Outpatient Encounter Medications as of 07/04/2021  Medication Sig   antiseptic oral rinse (BIOTENE) LIQD 15 mLs by Mouth Rinse route in the morning and at bedtime.   Budeson-Glycopyrrol-Formoterol (BREZTRI AEROSPHERE) 160-9-4.8 MCG/ACT AERO Inhale 2 puffs into the lungs in the morning and at bedtime.   carvedilol (COREG) 3.125 MG tablet Take 1 tablet (3.125 mg total) by mouth 2 (two) times daily with a meal.   cholecalciferol (VITAMIN D) 1000 units tablet Take 1,000 Units by mouth every evening.   empagliflozin (JARDIANCE) 10 MG TABS tablet Take 1 tablet (10 mg total) by mouth daily before breakfast.   eplerenone (INSPRA) 25 MG tablet TAKE 1 TABLET(25 MG) BY MOUTH DAILY   furosemide (LASIX) 40 MG tablet Take 2 tablets  (80 mg total) by mouth daily.   Ginger, Zingiber officinalis, (GINGER ROOT) 550 MG CAPS Take 1 capsule by mouth daily.   ipratropium (ATROVENT) 0.06 % nasal spray Place 2 sprays into both nostrils 3 (three) times daily.   levalbuterol (XOPENEX HFA) 45 MCG/ACT inhaler Inhale 2 puffs into the lungs every 4 (four) hours as needed for wheezing.   loratadine (CLARITIN) 10 MG tablet Take 10 mg by mouth in the morning.   Magnesium 250 MG TABS Take 250 mg by mouth every evening.   mexiletine (MEXITIL) 150 MG capsule TAKE 2 CAPSULES(300 MG) BY MOUTH TWICE DAILY   Multiple Vitamin (MULTIVITAMIN WITH MINERALS) TABS Take 1 tablet by mouth every evening.   nitroGLYCERIN (NITROSTAT) 0.4 MG SL tablet DISSOLVE 1 TABLET UNDER THE TONGUE EVERY 5 MINUTES AS NEEDED FOR CHEST PAIN   Olopatadine HCl (PATADAY OP) Apply 1 drop to eye daily.   Omeprazole 20 MG TBEC Take 1 tablet (20 mg total) by mouth daily.   Pirfenidone (ESBRIET) 267 MG TABS Take 2 tablets (534 mg total) by mouth with breakfast, with lunch, and with evening meal.   polyvinyl alcohol-povidone (HYPOTEARS) 1.4-0.6 % ophthalmic solution Place 1-2 drops into both eyes in the morning and at bedtime.   potassium chloride SA (KLOR-CON M) 20 MEQ tablet Take 1 tablet (20 mEq total) by mouth daily with lunch.   rivaroxaban (XARELTO) 20 MG TABS tablet TAKE 1 TABLET(20 MG) BY MOUTH DAILY WITH SUPPER   rosuvastatin (CRESTOR) 40 MG tablet TAKE 1 TABLET(40 MG) BY MOUTH EVERY EVENING   sacubitril-valsartan (ENTRESTO) 24-26 MG Take 1 tablet by  mouth 2 (two) times daily.   Simethicone (GAS-X PO) Take by mouth as directed.   No facility-administered encounter medications on file as of 07/04/2021.    Patient Active Problem List   Diagnosis Date Noted   Physical deconditioning 04/06/2021   IPF (idiopathic pulmonary fibrosis) ([place]) 11/16/2020   Encounter for follow-up surveillance of colon cancer    Benign neoplasm of descending colon    Pulmonary nodules 05/18/2015    Rectal bleeding 03/29/2015   Internal hemorrhoid, bleeding 03/29/2015   COPD (chronic obstructive pulmonary disease) ([place]) 03/28/2015   Gastroesophageal reflux disease 03/17/2015   Prostate cancer screening 03/09/2014   Thoracic aortic aneurysm ([place]) 09/28/2013   Encounter for therapeutic drug monitoring 03/03/2013   Constipation, chronic 16/10/9602   Umbilical hernia with intermittent pain 10/21/2012   Obesity (BMI 30-39.9) 10/21/2012   TIA (transient ischemic attack) 09/25/2012   Other malaise and fatigue 08/25/2012   Vitamin D deficiency 07/28/2012   CHF (congestive heart failure) ([place]) 07/28/2012   Chronotropic incompetence 10/25/2011   History of non anemic vitamin B12 deficiency 05/29/2011   Mild cognitive impairment 01/13/2011   Ringing in ears 01/13/2011   CAD (coronary artery disease) 09/14/2010   Elevated transaminase level--on amiodarone 06/19/2010   Biventricular implantable cardioverter-defibrillator in situ 05/16/2010   Long term (current) use of anticoagulants 04/11/2010   Microscopic hematuria 04/04/2010   CLAUDICATION 11/28/2009   Atrial fibrillation ([place]) 06/29/2009   Chronic combined systolic and diastolic heart failure ([place]) 05/26/2009   Ischemic cardiomyopathy 07/07/2008   VENTRICULAR TACHYCARDIA 06/29/2008   Hyperlipidemia 01/04/2008   Essential hypertension 01/04/2008   Chronic rhinitis 03/04/2007   Obstructive sleep apnea 03/02/2007    Conditions to be addressed/monitored:Atrial Fibrillation, CHF, HTN, HLD, and COPD  Care Plan : RN Care Manager Plan of Care  Updates made by Dimitri Ped, RN since 07/04/2021 12:00 AM     Problem: Chronic Disease Management and Care Coordination Needs (COPD, Afib, CHF, idiopathic pulmonary fibrosis,HTN, HLD)   Priority: High     Long-Range Goal: Establish Plan of Care for Chronic Disease Management Needs (COPD, Afib, CHF, idiopathic pulmonary fibrosis,HTN, HLD)   Start Date: 04/25/2021  Expected End Date:  04/25/2022  Priority: High  Note:   Case closed goals met Current Barriers:  Knowledge Deficits related to plan of care for management of Atrial Fibrillation, CHF, HTN, HLD, COPD, and Idiopathic Pulmonary Fibrosis  Chronic Disease Management support and education needs related to Atrial Fibrillation, CHF, HTN, HLD, COPD, and Idiopathic Pulmonary Fibrosis   States he is completed pulmonary rehab and he is walking at home to get exercise.  States he always has a cough and he is using his inhalers as directed.  States since he started the [place] he has lost weight and his weight is down to 185 now.  States he does weigh daily.  States he follows a low sodium healthy diet. States he is getting patient assistance for his expensive medications which helps a lot. States he knows when to call his doctors if he is having any issues  RNCM Clinical Goal(s):  Patient will verbalize understanding of plan for management of Atrial Fibrillation, CHF, HTN, HLD, COPD, and Idiopathic Pulmonary Fibrosis  as evidenced by voiced adherence to plan of care verbalize basic understanding of  Atrial Fibrillation, CHF, HTN, HLD, COPD, and Idiopathic Pulmonary Fibrosis  disease process and self health management plan as evidenced by voiced understanding and teach back  take all medications exactly as prescribed and will call provider  for medication related questions as evidenced by dispense report and pt verbalization  attend all scheduled medical appointments: Dr. [name] 11/22/21, Pulmonary 08/03/21, Cardiology 12/19/21  as evidenced by medical records demonstrate Improved adherence to prescribed treatment plan for Atrial Fibrillation, CHF, HTN, HLD, COPD, and Idiopathic Pulmonary Fibrosis  as evidenced by readings within limits, voiced adherence to plan of care continue to work with RN Care Manager to address care management and care coordination needs related to  Atrial Fibrillation, CHF, HTN, HLD, COPD, and  Idiopathic Pulmonary Fibrosis  as evidenced by adherence to CM Team Scheduled appointments through collaboration with RN Care manager, provider, and care team.   Interventions: 1:1 collaboration with primary care provider regarding development and update of comprehensive plan of care as evidenced by provider attestation and co-signature Inter-disciplinary care team collaboration (see longitudinal plan of care) Evaluation of current treatment plan related to  self management and patient's adherence to plan as established by provider   AFIB Interventions: (Status:  Goal Met.) Long Term Goal   Counseled on increased risk of stroke due to Afib and benefits of anticoagulation for stroke prevention Reviewed importance of adherence to anticoagulant exactly as prescribed Counseled on bleeding risk associated with Xarelto  and importance of self-monitoring for signs/symptoms of bleeding Afib action plan reviewed   Heart Failure Interventions:  (Status:  Goal Met.) Long Term Goal Basic overview and discussion of pathophysiology of Heart Failure reviewed Provided education on low sodium diet Reviewed Heart Failure Action Plan in depth and provided written copy Assessed need for readable accurate scales in home Provided education about placing scale on hard, flat surface Advised patient to weigh each morning after emptying bladder Discussed importance of daily weight and advised patient to weigh and record daily Discussed the importance of keeping all appointments with provider Screening for signs and symptoms of depression related to chronic disease state  Assessed social determinant of health barriers   COPD Interventions:  (Status:  Goal Met.) Long Term Goal Provided patient with basic written and verbal COPD education on self care/management/and exacerbation prevention Advised patient to track and manage COPD triggers Provided instruction about proper use of medications used for management of  COPD including inhalers Advised patient to self assesses COPD action plan zone and make appointment with provider if in the yellow zone for 48 hours without improvement Provided education about and advised patient to utilize infection prevention strategies to reduce risk of respiratory infection Discussed the importance of adequate rest and management of fatigue with COPD   Idiopathic Pulmonary Fibrosis   (Status:  Goal Met.)  Long Term Goal Evaluation of current treatment plan related to  Idiopathic Pulmonary Fibrosis  ,  Idiopathic Pulmonary Fibrosis   self-management and patient's adherence to plan as established by provider. Discussed plans with patient for ongoing care management follow up and provided patient with direct contact information for care management team Evaluation of current treatment plan related to Idiopathic Pulmonary Fibrosis  and patient's adherence to plan as established by provider Provided education to patient re: Idiopathic Pulmonary Fibrosis  Discussed plans with patient for ongoing care management follow up and provided patient with direct contact information for care management team  Hyperlipidemia Interventions:  (Status:  New goal.) Long Term Goal Medication review performed; medication list updated in electronic medical record.  Provider established cholesterol goals reviewed Counseled on importance of regular laboratory monitoring as prescribed Reviewed role and benefits of statin for ASCVD risk reduction Reviewed importance of limiting foods high in cholesterol  Hypertension Interventions:  (Status:  Goal Met.) Long Term Goal Last practice recorded BP readings:  BP Readings from Last 3 Encounters:  04/06/21 98/60  03/09/21 128/68  02/28/21 108/68  Most recent eGFR/CrCl:  Lab Results  Component Value Date   EGFR 73 05/08/2020    No components found for: CRCL  Evaluation of current treatment plan related to hypertension self management and patient's  adherence to plan as established by provider Discussed plans with patient for ongoing care management follow up and provided patient with direct contact information for care management team Advised patient, providing education and rationale, to monitor blood pressure daily and record, calling PCP for findings outside established parameters  Patient Goals/Self-Care Activities: Take all medications as prescribed Attend all scheduled provider appointments Call pharmacy for medication refills 3-7 days in advance of running out of medications Perform all self care activities independently  Call provider office for new concerns or questions  call office if I gain more than 2 pounds in one day or 5 pounds in one week do ankle pumps when sitting track weight in diary watch for swelling in feet, ankles and legs every day weigh myself daily eat more whole grains, fruits and vegetables, lean meats and healthy fats identify and remove indoor air pollutants limit outdoor activity during cold weather listen for public air quality announcements every day attend pulmonary rehabilitation follow rescue plan if symptoms flare-up get at least 7 to 8 hours of sleep at night begin a symptom diary check pulse (heart) rate once a day take medicine as prescribed check blood pressure daily write blood pressure results in a log or diary call doctor for signs and symptoms of high blood pressure limit salt intake to 2374m/day call for medicine refill 2 or 3 days before it runs out take all medications exactly as prescribed call doctor with any symptoms you believe are related to your medicine  Follow Up Plan:  The patient has been provided with contact information for the care management team and has been advised to call with any health related questions or concerns.  No further follow up required: Case closed goals met       Plan:The patient has been provided with contact information for the care  management team and has been advised to call with any health related questions or concerns.  No further follow up required: Case closed goals met MPeter GarterRN, [facility] CDE Care Management Coordinator  [facility] ([phone]

## 2021-07-04 NOTE — Patient Instructions (Signed)
Visit Information  Thank you for allowing me to share the care management and care coordination services that are available to you as part of your health plan and services through your primary care provider and medical home. Please reach out to me at [phone] if the care management/care coordination team may be of assistance to you in the future.   Peter Garter RN, Jackquline Denmark, CDE Care Management Coordinator [facility] [phone]

## 2021-07-19 ENCOUNTER — Telehealth: Payer: Self-pay | Admitting: Internal Medicine

## 2021-07-19 DIAGNOSIS — J84112 Idiopathic pulmonary fibrosis: Secondary | ICD-10-CM

## 2021-07-19 MED ORDER — PIRFENIDONE 267 MG PO TABS: 534.0000 mg | ORAL_TABLET | Freq: Three times a day (TID) | ORAL | 1 refills | Status: DC

## 2021-07-19 NOTE — Telephone Encounter (Signed)
Patient is calling to request a refill for his medication, Pirfenidone (Espreit) from [facility].  Patient stated he uses [facility], # [phone].  He also stated that he is out of the medication.  Please advise.

## 2021-07-19 NOTE — Telephone Encounter (Signed)
Rx for Esbriet was sent to preferred pharm for 90 day supply. Pt aware. Nothing further needed.

## 2021-07-23 MED ORDER — PIRFENIDONE 267 MG PO TABS: 534.0000 mg | ORAL_TABLET | Freq: Three times a day (TID) | ORAL | 1 refills | Status: DC

## 2021-07-23 NOTE — Addendum Note (Signed)
Addended by: Cassandria Anger on: 07/23/2021 11:47 AM   Modules accepted: Orders

## 2021-07-23 NOTE — Telephone Encounter (Signed)
Rx for Esbriet sent to incorrect pharmacy  on 07/19/21. Refill sent to [facility]  Dose: 55m three times daily  LFTs on 05/21/21 wnl  DKnox Saliva PharmD, MPH, BCPS, CPP Clinical Pharmacist (Rheumatology and Pulmonology)

## 2021-07-24 ENCOUNTER — Other Ambulatory Visit: Payer: Self-pay | Admitting: Cardiology

## 2021-07-24 DIAGNOSIS — I059 Rheumatic mitral valve disease, unspecified: Secondary | ICD-10-CM

## 2021-07-24 DIAGNOSIS — Z95810 Presence of automatic (implantable) cardiac defibrillator: Secondary | ICD-10-CM

## 2021-07-24 DIAGNOSIS — I480 Paroxysmal atrial fibrillation: Secondary | ICD-10-CM

## 2021-07-24 DIAGNOSIS — I429 Cardiomyopathy, unspecified: Secondary | ICD-10-CM

## 2021-08-03 ENCOUNTER — Ambulatory Visit (INDEPENDENT_AMBULATORY_CARE_PROVIDER_SITE_OTHER): Payer: Medicare Other | Admitting: Internal Medicine

## 2021-08-03 ENCOUNTER — Encounter: Payer: Self-pay | Admitting: Internal Medicine

## 2021-08-03 VITALS — BP 80/50 | HR 60 | Temp 97.6°F | Ht 70.0 in | Wt 190.0 lb

## 2021-08-03 DIAGNOSIS — R634 Abnormal weight loss: Secondary | ICD-10-CM

## 2021-08-03 DIAGNOSIS — T50905A Adverse effect of unspecified drugs, medicaments and biological substances, initial encounter: Secondary | ICD-10-CM

## 2021-08-03 DIAGNOSIS — I255 Ischemic cardiomyopathy: Secondary | ICD-10-CM | POA: Diagnosis not present

## 2021-08-03 DIAGNOSIS — J84112 Idiopathic pulmonary fibrosis: Secondary | ICD-10-CM

## 2021-08-03 DIAGNOSIS — Z5181 Encounter for therapeutic drug level monitoring: Secondary | ICD-10-CM

## 2021-08-03 NOTE — Patient Instructions (Addendum)
Pulmnary Fibrosis with emphysema ILD (interstitial lung disease) (HCC) IPF (idiopathic pulmonary fibrosis) (HCC)  - IPF is  likely clinically stable past 1 year with low dose ebreit through progressed in 3 years  - though on PFT some confusion if is worse in 6 months  - need CT scan to denote progression better - -Currently on Esbriet 2 pills 3 times daily   -  being capped because baseline mexilitine can increase esbriet levels  - esbiret contributing to fatigue   Plan - check LFT 08/03/2021 - continue esbriet 2 pills x 3 times daily  - controlling GI symptoms with omeprazole, miralax, ginger regimen as needed - Continue pulmonary rehab - get HRCT supine and prone in sept 2023   - to be done at [facility]  - 1 year followup  - appreciate clinical trial interst in future but holding off for now  Rt side infra-axillary and infra-scapular musculoskeletal pain - since march 2023  - afer starting rehab  Plan  - we can see if CT chest sept 2023 sheds anylight  Chronic obstructive pulmonary disease, unspecified COPD type ([place])  - not in flare up  Plan  - cotninue breztri daily - albueo as needed - refill clotrimazole for thrush as needed  GERD - appears omeprazole helping you  Plan  - continue omeprazole on empty stomach - as it has potential beneficial effect for IPF  Weight loss- drug induced Low appetite   -ongoing weight loss  due to esbriet and jardiance  but seems stabilized  Plan  -monitor; ok to tolerate weight loss till 170- 175#   Followup - sept 2023 but after CT  - 30 min visit; symptoms score and walk test at followup

## 2021-08-03 NOTE — Progress Notes (Signed)
ROV 02/19/18 --81 year old man who follows up today for mixed obstruction and restriction, COPD as well as stable bilateral lower lobe interstitial disease.  We questioned possible amiodarone toxicity and this was stopped.  He had a repeat high-resolution CT scan of the chest on 02/17/2018 that I reviewed.  This shows some mild emphysematous change irregular peripheral interstitial disease with some mild associated bronchiectasis, no significant honeycomb change.  Unchanged compared with his prior 1 year ago.  Pulmonary function testing done on 02/09/2018 shows mild mixed restriction and obstruction, overall stable compared with his prior 1 year ago.        ROV 04/23/19 --[name] is 81 and has a history of mixed obstruction and restriction on pulmonary function testing, COPD as well as stable bilateral lower lobe interstitial disease.  His last CT scan was 02/17/2018 (stable).  He was previously on amiodarone, this has been stopped for several years.  Currently managed on Symbicort, is using albuterol very rarely He reports that he is having some imbalance, feels unstable w ambulation, has had some falls.  He is having more cough, mucous production over the last several months. He has a lot of nasal gtt - uses Atrovent NS with improvement. He is on flonase daily, loratadine.   Chest x-ray from 04/15/2019 reviewed by me, shows persistent interstitial markings particularly in the right midlung. No significant change or increase noted when comparing with prior films.   ROV 09/27/20 --follow-up visit 81 year old gentleman whom we have followed for mixed obstruction and restriction due to COPD and also bilateral lower lobe interstitial disease question amiodarone toxicity.  Autoimmune panel in October 2017 was negative.  He is now in atrial fibrillation almost all the time, notices that his dyspnea is associated with tachycardia.  Currently managed on Symbicort, albuterol which he is using 2-3x a month.   He is having a lot of cough and nasal congestion / drainage. He wonders if the flonase is part of the problem, was told to do atrovent up to tid to see if he gets benefit.   Most recent CT chest performed 09/22/2020 reviewed by me, shows persistent patchy groundglass attenuation and septal thickening in the subpleural regions without any definite honeycomb, possibly slightly progressed compared with 02/17/2018.  Pulmonary function testing performed today and reviewed by me, showed mixed restriction and obstruction with an FEV1 of 1.9 865% predicted and no bronchodilator response.  Significant decrease in FEV1 compared with 02/09/2018.  Restricted lung volumes, decreased diffusion capacity that does correct when adjusted for his alveolar volume.  OV 10/05/2020 -transfer of care to the ILD center with Dr. Chase Caller.  Patient being referred by Dr. Baltazar Apo  Subjective:  Patient ID: [name], male , DOB: 05-21-1940 , age 81 y.o. , MRN: [id number] , ADDRESS: [place] [address] PCP [name], MD Patient Care Team: [name], MD as PCP - General (Family Medicine) Festus Aloe, MD as Consulting Physician (Urology) Michael Boston, MD as Consulting Physician (General Surgery) Stanford Breed, Denice Bors, MD as Consulting Physician (Cardiology) Jodi Marble, MD as Consulting Physician (Otolaryngology) Deboraha Sprang, MD as Consulting Physician (Cardiology) Luberta Mutter, MD as Consulting Physician (Ophthalmology) Lamonte Sakai Rose Fillers, MD as Consulting Physician (Pulmonary Disease) Mauri Pole, MD as Consulting Physician (Gastroenterology) [name], [facility] as Pharmacist (Pharmacist)  This Provider for this visit: Treatment Team:  Attending Provider: Brand Males, MD    10/05/2020 -  Chief Complaint  Patient presents with   Follow-up    Pt states that he does have complaints of SOB that can happen at any time but is worse with exertion.      HPI [name] 81 y.o. -referred by Dr. Baltazar Apo.  History is provided by the patient and also review of the medical records.  Patient tells me he is aware that he has ILD.  He has a significant symptom burden.  He says he is recently been told his ILD is gotten worse.  He was on amiodarone for many years.  Then approximately 18 months ago he stopped it because he got worried about the drug.  Dr. Lamonte Sakai notes indicate that patient has combined emphysema and interstitial lung disease.  His pulmonary function test profile shows 20% decline in FVC in the last 5 years or so with most of it coming in the last 2 years.  High-resolution CT chest only shows some decline.  High-resolution CT chest personally visualized by me and is probable UIP.  His serologies are negative.  He is a former smoker having quit over 40 years ago.  He is Caucasian and his age is greater than 31.  Male gender.  He definitely has progressive symptoms.  He really wants a good symptom control.  He also wants understand his disease.  Today most of the focus was spent on helping him understand interstitial lung disease.  I explicitly told him that I would not be able to address symptom burden today.  He will also switch his comprehensive pulmonary care to see Dr. Chase Caller myself.  He has had an MI in the past.  His atrial fibrillation is on Eliquis.  He is no longer on amiodarone.  His walking desaturation test did not show desaturation more than 3 points.   CT Chest data Sept 2022  Narrative & Impression  CLINICAL DATA:  81 year old male with history of COPD. Evaluate for interstitial lung disease.   EXAM: CT CHEST WITHOUT CONTRAST   TECHNIQUE: Multidetector CT imaging of the chest was performed following the standard protocol without intravenous contrast. High resolution imaging of the lungs, as well as inspiratory and expiratory imaging, was performed.   COMPARISON:  Chest CT 02/17/2018.    FINDINGS: Cardiovascular: Heart size is normal. There is no significant pericardial fluid, thickening or pericardial calcification. There is aortic atherosclerosis, as well as atherosclerosis of the great vessels of the mediastinum and the coronary arteries, including calcified atherosclerotic plaque in the left main, left anterior descending, left circumflex and right coronary arteries. Calcifications of the aortic valve. Curvilinear calcifications associated with the myocardium of the inferior wall of the left ventricle in the mid ventricle and base, likely sequela of prior left circumflex coronary artery territory myocardial infarction(s). Left-sided biventricular pacemaker/AICD with lead tips terminating in the right atrial appendage, right ventricular apex, and overlying the lateral wall the left ventricle via the coronary sinus and coronary veins.   Mediastinum/Nodes: No pathologically enlarged mediastinal or hilar lymph nodes. Please note that accurate exclusion of hilar adenopathy is limited on noncontrast CT scans. Esophagus is unremarkable in appearance. No axillary lymphadenopathy.   Lungs/Pleura: High-resolution images again demonstrate some patchy areas of ground-glass attenuation, septal thickening, subpleural reticulation, mild traction bronchiectasis and peripheral bronchiolectasis in the lungs bilaterally. No definite honeycombing. These findings have a mild craniocaudal gradient. Inspiratory and expiratory imaging is unremarkable. No acute consolidative airspace disease. No pleural effusions. Chronic elevation of the right hemidiaphragm is again noted. No  definite suspicious appearing pulmonary nodules or masses are noted.   Upper Abdomen: Low-attenuation lesions are noted in the left lobe of the liver, incompletely characterized on today's non-contrast CT examination, but similar to the prior study and measuring up to 1.3 cm in diameter in segment 2,  statistically likely to represent cysts.   Musculoskeletal: There are no aggressive appearing lytic or blastic lesions noted in the visualized portions of the skeleton.   IMPRESSION: 1. The appearance of the lungs is compatible with interstitial lung disease, categorized as probable usual interstitial pneumonia (UIP) per current ATS guidelines. Findings appear mildly progressive compared to the prior study. 2. Aortic atherosclerosis, in addition to left main and 3 vessel coronary artery disease. Assessment for potential risk factor modification, dietary therapy or pharmacologic therapy may be warranted, if clinically indicated. 3. There are calcifications of the aortic valve. Echocardiographic correlation for evaluation of potential valvular dysfunction may be warranted if clinically indicated.   Aortic Atherosclerosis (ICD10-I70.0).     Electronically Signed   By: Vinnie Langton M.D.   On: 09/22/2020 11:28   No results found.  Simple office walk 185 feet x  3 laps goal with forehead probe 10/05/2020   O2 used ra  Number laps completed 3  Comments about pace avg  Resting Pulse Ox/HR 100% and 70/min  Final Pulse Ox/HR 97% and 82/min  Desaturated </= 88% no  Desaturated <= 3% points yes  Got Tachycardic >/= 90/min no  Symptoms at end of test Mild dyspnea  Miscellaneous comments x      Results for SAVALAS, MONJE (MRN [id number]) as of 10/05/2020 11:51  Ref. Range 11/14/2015 11:52 09/27/2020 12:19 09/27/2020 13:46  Anti Nuclear Antibody (ANA) Latest Ref Range: NEGATIVE  NEG  NEGATIVE  Anti JO-1 Latest Ref Range: 0.0 - 0.9 AI <0.7    Cyclic Citrullin Peptide Ab Latest Units: UNITS <16  <16  ds DNA Ab Latest Units: IU/mL 1    RA Latex Turbid. Latest Ref Range: <14 IU/mL <14 <14 <14  ENA SM Ab Ser-aCnc Latest Ref Range: <1.0 NEG AI  <1.0 NEG    ANA,IFA RA DIAG PNL W/RFLX TIT/PATN Unknown   Rpt  Ribonucleic Protein(ENA) Antibody, IgG Latest Ref Range: <1.0 NEG AI  <1.0 NEG     SSA (Ro) (ENA) Antibody, IgG Latest Ref Range: <1.0 NEG AI <1.0 NEG <1.0 NEG   SSB (La) (ENA) Antibody, IgG Latest Ref Range: <1.0 NEG AI <1.0 NEG <1.0 NEG   Scleroderma (Scl-70) (ENA) Antibody, IgG Latest Ref Range: <1.0 NEG AI  <1.0 NEG      OV 11/08/2020  Subjective:  Patient ID: [name], male , DOB: [birth year] , age 34 y.o. , MRN: [id number] , ADDRESS: [place] [address] PCP [name], MD Patient Care Team: [name], MD as PCP - General (Family Medicine) Festus Aloe, MD as Consulting Physician (Urology) Michael Boston, MD as Consulting Physician (General Surgery) [name], MD as Consulting Physician (Cardiology) Jodi Marble, MD as Consulting Physician (Otolaryngology) Deboraha Sprang, MD as Consulting Physician (Cardiology) Luberta Mutter, MD as Consulting Physician (Ophthalmology) Lamonte Sakai Rose Fillers, MD as Consulting Physician (Pulmonary Disease) Mauri Pole, MD as Consulting Physician (Gastroenterology) [name], [facility] as Pharmacist (Pharmacist)  This Provider for this visit: Treatment Team:  Attending Provider: Brand Males, MD    11/08/2020 -   Chief Complaint  Patient presents with   Follow-up    Pt states that he has been  worried about taking the Esbriet and states that he has not started taking it yet. States his breathing is about the same.     HPI MARQUE RADEMAKER 81 y.o. - here with wife to discuss disease, Wife is 41 years old. Her name is Myrnal She was a clinical trials coordinator in the late 1990s at [employer] and was involved in oncology and sildenafil trials. Lot of questions on type of fibrosis (IPF), natural course (few to several  yearS), quality of life expectations, anti fibrotics . Wants symptom relief - referred to rehab. HE is interested in support group - gave him contact number. Risk factor - prior smoking and amio. He has not started esbriet yet. Will do on 11/14/20 . This  is becase he and wife went to  reunion. Ate fried food and got diarrhea. Now resolved.   [facility] Comprehensive ILD Questionnaire    Symptoms:  SYMPTOM SCALE - ILD 11/08/2020  Current weight 209#  O2 use ra  Shortness of Breath 0 -> 5 scale with 5 being worst (score 6 If unable to do)  At rest 0  0Simple tasks - showers, clothes change, eating, shaving 0  Household (dishes, doing bed, laundry) 0  Shopping 3  Walking level at own pace 4  Walking up Stairs 5  Total (30-36) Dyspnea Score 12 x 2 years  same Since onset  How bad is your cough? moderate x  2years. Getting worse. Has light yellow/green sputum. Clears throat  How bad is your fatigue light  How bad is nausea 0  How bad is vomiting?  0  How bad is diarrhea? 0  How bad is anxiety? 0  How bad is depression 0  Any chronic pain - if so where and how bad 0      Past Medical History :  Also copd, cad, chf nos, osa - not using cPAP. Hx of pna > 10 years ago. Hx of renal sones + Has had 5 mRNA covid shots. Last Sept 2022. Never had covid Chronic systolic chf - ef 62-83% in June 2021 (20% in 2017) Patulous esophagus GERD Hx of amio 15--20 years. STppped 2019 Elevated R diapharagm  ROS:  Fatigue + Dry eyes + Gerd +  FAMILY HISTORY of LUNG DISEASE:  denes  PERSONAL EXPOSURE HISTORY:  Smoker [phone]. NOt smoked in 49  years  HOME  EXPOSURE and HOBBY DETAILS :  [place]ers[place]. [place]. 2 of his tile floors have mildew. Not used jacuzzi in 10 years. 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  Patient has underlying COPD and IPF.  CT chest in September 22, 2020 showed  probable UIP.  And mildly progressive compared to previous study.  Patient was recommended to start on Esbriet.  Patient says he has started Esbriet and seems to be tolerating okay.  He has had a couple episodes where he has some loose stools and he took Imodium.  Unfortunately did have some constipation so he also took MiraLAX which caused him to have upset stomach.  We discussed dietary recommendations and the use of Imodium.  Patient says appetite is good.  No nausea  vomiting.  Patient is on Mexiletine , his Esbriet dose will need to remain at 534 mg 3 times a day.  Patient is a been on a tapering dose and has reached his maximum maintenance dose. Patient has shortness of breath with activities.  Remains active.  Gets winded with heavy lifting or prolonged walking.  Has a daily minimally productive cough.   12/06/2020 Follow up ; IPF, COPD  Patient returns for a 2-week follow-up.  Patient has underlying COPD.  Is on Breztri inhaler.  Patient says overall breathing is doing okay.  Feels that it has slightly improved with decreased cough.  Patient also has underlying IPF.  Recently started on Esbriet.  He is on a lower dose due to drug interactions.  Currently on 534 mg 3 times a day.  Last visit patient was having some increased stomach issues.  We discussed some dietary changes.  Patient is now eating less fried foods and higher protein.  Patient says this is made a huge difference and stomach is much better. Hepatic function panel on November 16, 2020 showed normal LFTs.   TEST/EVENTS :  09/22/2020 High-resolution CT chest compatible with interstitial lung disease probable UIP, mildly progressive compared to previous stud      OV 12/22/2020  Subjective:  Patient ID: [name], male , DOB: Jun 10, 1940 , age 41 y.o. , MRN: [id number] , ADDRESS: [place] [id number] PCP [name], MD Patient Care Team: [name], MD as PCP - General (Family  Medicine) Festus Aloe, MD as Consulting Physician (Urology) Michael Boston, MD as Consulting Physician (General Surgery) Stanford Breed, Denice Bors, MD as Consulting Physician (Cardiology) Jodi Marble, MD as Consulting Physician (Otolaryngology) Deboraha Sprang, MD as Consulting Physician (Cardiology) Luberta Mutter, MD as Consulting Physician (Ophthalmology) Lamonte Sakai Rose Fillers, MD as Consulting Physician (Pulmonary Disease) Mauri Pole, MD as Consulting Physician (Gastroenterology) [name], [facility] as Pharmacist (Pharmacist)  This Provider for this visit: Treatment Team:  Attending Provider: Brand Males, MD   IPF dx given Sept 2022 -> started esbriet  12/22/2020 -   Chief Complaint  Patient presents with   Follow-up     HPI [name] 81 y.o. presents with his wife.-since giving him the diagnosis of IPF and recommending pirfenidone over nintedanib [he has heart disease] he see nurse practitioner 2x1 for a face-to-face visit not a video visit.  He is on 2 pills 3 times a day of pirfenidone.  This is because he is on mexiletine [antiarrhythmic that can also cause abdominal pain, headache and nausea] because of this and CYPa12 mechanism that could inhibit pirfenidone metabolism resulting in elevated levels of pirfenidone he has been kept on 2 pills 3 times daily.  He is upset that this was not picked up by the pulmonary pharmacist but only by the cardiology pharmacist.  He reports because of this he is lost a little bit of confidence in [place].  Overall though he says that he has chronic alternating diarrhea with constipation.  He takes Imodium for the diarrhea and then this resolves and constipation.  Then he has to take MiraLAX.  This is at his baseline despite being on Esbriet it is not worse.  He says he is just frustrated by this alternating constipation and diarrhea.  Between diarrhea and constipation he does not favor constipation.  The only side effect he is noticing from  [place] is that he is more fatigued.  Otherwise he is overall stable as seen  by the symptom score below other than fatigue.  He is also worried because he is on submaximal doses of pirfenidone that his disease could progress.  I did explain to him that even at 2 pills 3 times daily it is beneficial and with inhibitor effects of mexiletine levels are slightly higher and could potentially reflect similar to being on 3 pills 3 times daily.  Did explain to him that he might not be able to tolerate 3 pills 3 times daily.  Given his cardiac issues nintedanib currently as second line.  We had a shared understanding and agreement on this approach.  No results found.       OV 01/19/2021  Subjective:  Patient ID: [name], male , DOB: 09/14/1940 , age 41 y.o. , MRN: [id number] , ADDRESS: [place] [id number] PCP [name], MD Patient Care Team: [name], MD as PCP - General (Family Medicine) Festus Aloe, MD as Consulting Physician (Urology) Michael Boston, MD as Consulting Physician (General Surgery) Stanford Breed Denice Bors, MD as Consulting Physician (Cardiology) Jodi Marble, MD as Consulting Physician (Otolaryngology) Deboraha Sprang, MD as Consulting Physician (Cardiology) Luberta Mutter, MD as Consulting Physician (Ophthalmology) Lamonte Sakai Rose Fillers, MD as Consulting Physician (Pulmonary Disease) Mauri Pole, MD as Consulting Physician (Gastroenterology) [name], [facility] as Pharmacist (Pharmacist)  This Provider for this visit: Treatment Team:  Attending Provider: Brand Males, MD    01/19/2021 -   Chief Complaint  Patient presents with   Follow-up    PFT performed today.  Pt states that he noticed with last bloodwork that his liver enzymes were elevated and also states that he has noticed some yellowing of skin.  IPF dx given Sept 2022 -> started esbriet   HPI [name] 81 y.o. -returns for follow-up.  This visit is to  see his pulmonary function test and his uptake with pirfenidone.  His pulmonary function test shows stability/slight improvement from September 2022 and similar to his previous baseline.  In terms of pirfenidone intake he continues to have alternating diarrhea and constipation.  In talking to him in more detail he says before his Esbriet he used to take MiraLAX once every other day and have a bowel movement [hard stool] every day once.  After this starting as.  He is now taking his MiraLAX only 1/week.  1 day he has diarrhea in 4 days he is got constipation without any bowel movement.  Therefore he is not taking his MiraLAX for his previous schedule which we discussed at the last visit.  In addition he is lost 1 pound of weight.  He is also reporting intermittent gaseous symptoms bloating acid reflux sour taste.  He takes Tums for this.  He is not taking anything for acid reflux such as PPI or H2 blockade.  He is just taking Tums.  We did discuss the idea of taking ginger capsules but he drinks ginger ale 1 bottle every day.  Did indicate to him even in previous visit that sugary drinks can cause diarrhea and potentially a carbonated drink can contribute to acid reflux.  He is agreed to stop this.  I showed him an example of a ginger capsule that he could buy at [facility] or convenience store.  Of note his Esbriet intake is 2 pills 3 times daily due to drug interaction with mexiletine. For associated emphysema is on inhaler therapy.  Of note: His AST on 01/01/2021 was 44.  Other lab calls  is abnormal even though it is below 45.  He was informed of this results.  The plan is to retest today.  He does tell me that he and his wife noticed that 1 day a few weeks ago that his left finger was yellow and after that it turned out to be normal after a few days.  Due to concerns of potential jaundice and worsening LFTs Arava and the ongoing GI side effects have asked him to stop his Esbriet till further notice or 1  week    OV 03/09/2021  Subjective:  Patient ID: [name], male , DOB: March 11, 1940 , age 72 y.o. , MRN: [id number] , ADDRESS: [place] [phone] PCP [name], MD Patient Care Team: [name], MD as PCP - General (Family Medicine) Festus Aloe, MD as Consulting Physician (Urology) Michael Boston, MD as Consulting Physician (General Surgery) Stanford Breed Denice Bors, MD as Consulting Physician (Cardiology) Jodi Marble, MD as Consulting Physician (Otolaryngology) Deboraha Sprang, MD as Consulting Physician (Cardiology) Luberta Mutter, MD as Consulting Physician (Ophthalmology) Lamonte Sakai Rose Fillers, MD as Consulting Physician (Pulmonary Disease) Mauri Pole, MD as Consulting Physician (Gastroenterology) [name], [facility] (Pharmacist)  This Provider for this visit: Treatment Team:  Attending Provider: Brand Males, MD    03/09/2021 -   Chief Complaint  Patient presents with   Follow-up    Pt states he is about the same since last visit. States he recently began pulmonary rehab.   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 -returns for his IPF follow-up.  He has further weight loss.  He is now down to 199 pounds although he is still overweight.  His BMI is over 28.  He is confident that his weight loss is because of medications.  It is otherwise drug-induced weight loss.  He believes the pirfenidone has reduced his appetite.  In addition Jardiance is also causing weight loss.  At this point in time he is happy with his weight loss because he is still overweight.  Overall he is tolerating his pirfenidone well.  He was having alternating diarrhea and constipation but after taking my suggestion and trialing out different regimens he is now settled on taking  omeprazole along with ginger and MiraLAX in a scheduled way.  This is now helped prevent the alternating diarrhea and constipation.  He is quite happy with this.  He takes his pirfenidone at 2 pills 3 times daily and 5-6 hours apart.  It does interfere with the social schedule.  Sometimes family wants to have have dinner or lunch at different times.  He is asking for some flexibility.  I encouraged him to try taking it slightly out of schedule and also with different types of snacks and experiment to see whether he has any side effects.  I did indicate to him ultimately the side effects of what will determine his tolerance.  He tells me that the omeprazole is doing quite well to control the GERD.  However the over-the-counter blister pack says it is only for 14 days.  He took clarification on this.  We discussed the long-term effects of omeprazole but overall taking balance versus risk I recommended that he continue his omeprazole for now.  In terms of his symptoms he stable.  He is attending  pulmonary rehabilitation.  He says it is helping him.  His wife used to be a clinical trials coordinator.  He is interested in clinical trials.  However at this point in time he says his son is requested that he take a second opinion at [facility].  He is seeing Dr. Wynn Maudlin on 03/14/2021.  I did indicate to him that we are all part of the pulmonary fibrosis foundation network.  I did indicate to him that Dr. Randol Kern is a well-known expert.  I have also indicated to him that I support the second opinion and I have personally emailed Dr. Randol Kern.  I have asked the patient to take CD-ROM of the CT scan to [facility].  Recent lab work 7 days ago shows normal creatinine and normal liver function test.  [facility] Dr Maxie Barb 03/14/21  Mr. Millspaugh is a very pleasant 81 y.o.-year-old male with no significant prior pulmonary problems as a child or younger adult. He also has history of CAD, ICM/chronic systolic/diastolic HF  s/p ICD, Afib on mexiletine, HTN, HLD. In 2021, he first noticed worsening shortness of breath on exertion but also attributed to COPD and Afib. Think it has been gradually worsening over the past 6 months but denies any recent illnesses. He was initially diagnosed based on imaging showing probable UIP during management of COPD. Followed by Dr. Romilda Garret. Never treated with steroids. Negative autoimmune panel in 2017 and 09/2020. He was previously on amiodarone for 15-20 years and stopped in 2019 due to concerns for ILD. Started on Esbriet in Sept 2022. Initially had a lot of diarrhea and constipation, and now more tolerable with less GI issues since starting omeprazole and ginger root and miralax 2-3x /week. Wears sunscreen and UV protective clothing.   He reports breathing is ok. Biggest problem is dry mouth and cough productive light yellow/green sputum chronic for the past 4 years attributed to COPD and chronic sinus drainage. Takes Breztri, albuterol, ipratropium nasal sprays which helps. Able to clear sputum. Started pulmonary rehab about 3 weeks ago in mid Feb 2023 which has helped a lot. Doing it twice a week, first 15 minutes walking, 2nd 15 minutes with stationary elliptical, then stretching but has to rest 2-3 hours at home. No fever/chills/night sweats. Lost weight 211 to 193lb since starting Esbriet.  His current exercise capacity includes walking 15 minute [place]   Mr. Woodmansee is a very pleasant 81 y.o.-year-old male with no significant prior pulmonary problems as a child or younger adult. He also has history of CAD, ICM/chronic systolic/diastolic HF s/p ICD, Afib on mexiletine, HTN, HLD. In 2021, he first noticed worsening shortness of breath on exertion but also attributed to COPD and Afib. Think it has been gradually worsening over the past 6 months but denies any recent illnesses. He was initially diagnosed based on imaging showing  probable UIP during management of COPD. Followed by Dr. Romilda Garret. Never treated with steroids. Negative autoimmune panel in 2017 and 09/2020. He was previously on amiodarone for 15-20 years and stopped in 2019 due to concerns for ILD. Started on Esbriet in Sept 2022. Initially had a lot of diarrhea and constipation, and now more tolerable with less GI issues since starting omeprazole and ginger root and miralax 2-3x /week. Wears sunscreen and UV protective clothing.   He reports breathing is ok. Biggest problem is dry mouth and cough productive light yellow/green sputum chronic for the past 4 years attributed to  COPD and chronic sinus drainage. Takes Breztri, albuterol, ipratropium nasal sprays which helps. Able to clear sputum. Started pulmonary rehab about 3 weeks ago in mid Feb 2023 which has helped a lot. Doing it twice a week, first 15 minutes walking, 2nd 15 minutes with stationary elliptical, then stretching but has to rest 2-3 hours at home. No fever/chills/night sweats. Lost weight 211 to 193lb since starting Esbriet.  His current exercise capacity includes walking 15 minute every [place]    Mr. Every is a 81 y.o. 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 He has overall been stable to very slightly progressed on imaging and PFTs for more than 6-8 years which is atypical for IPF. [place]. Negative autoimmune labs. He has been appropriately managed with Esbriet, dose reduced due to interactions with mexiletine, as well as pulmonary rehab. He is also being closely followed by cardiology for systolic HF with last echo in 2021 showing no RV dysfunction. Recommend to continue current dose of Esbriet and encouraged to stay active while at home.  He also has a large esophagus on  imaging and he reports severe acid reflux prior to starting Esbriet that is now better controlled on Esbriet. Aspiration and reflux unlikely to be primary cause of fibrosis but may be exacerbating his disease in predominantly basilar distribution so educated patient on aspiration precautions including raising HOB at least 30 degrees and not eating meals at least 2-3 hours before bedtime. He will try to nap in his recliner with head raised instead of lying flat in bed.   OV 05/07/2021  Subjective:  Patient ID: [name], male , DOB: 08-Jun-1940 , age 29 y.o. , MRN: [id number] , ADDRESS: [place] [id number] PCP [name], MD Patient Care Team: [name], MD as PCP - General (Family Medicine) Festus Aloe, MD as Consulting Physician (Urology) Michael Boston, MD as Consulting Physician (General Surgery) Stanford Breed Denice Bors, MD as Consulting Physician (Cardiology) Jodi Marble, MD as Consulting Physician (Otolaryngology) Deboraha Sprang, MD as Consulting Physician (Cardiology) Luberta Mutter, MD as Consulting Physician (Ophthalmology) Lamonte Sakai Rose Fillers, MD as Consulting Physician (Pulmonary Disease) Mauri Pole, MD as Consulting Physician (Gastroenterology) [name], [facility] (Pharmacist) Dimitri Ped, RN as Case Manager  This Provider for this visit: Treatment Team:  Attending Provider: Brand Males, MD    05/07/2021 -   Chief Complaint  Patient presents with   Follow-up    Pt states he has been doing well since last visit and denies any complaints.    HPI [name] 81 y.o. -returns for follow-up.  After last visit he did see Dr. Wynn Maudlin at [facility].  I reviewed the notes.  Really appreciate Dr. Randol Kern adding an extra perspective.  His notes are captured above.  Patient continues on pirfenidone at 2 pills 3 times daily in the setting of mexiletine.  He has completed rehabilitation.  Has had 5  COVID boosters.  He asked me about taking more booster.  I recommended to hold off but again emphasized his risk benefit ratio.  Did express that if he got COVID he could take antiviral.  We should wait till a new vaccine for the new strains comes up.  At this point in time the  vaccine is for the old strains.  He feels overall stable after taking pirfenidone and also pulmonary rehabilitation.  He says this is the best he is felt.  He continues with his alternating diarrhea and constipation but it is actually improved.  Because of this he does not want to do any clinical trial that might have GI side effects.  He is interested in clinical trials but at this point in time he wants to hold off.  He got a letter from Dr. Silverio Decamp about undergoing colonoscopy.  I did approve this.  He continues to lose weight he says this is unintentional and account of pirfenidone and his diabetic drug.  Currently the level of weight loss is healthy.  Of note he has chronic systolic heart failure.  Last echocardiogram was in 2021.  He is going to see Dr. Stanford Breed in June 2023.  I did recommend getting an echocardiogram before he saw Dr. Stanford Breed and I will order this I had of Dr. Jacalyn Lefevre visit.  He is okay with this plan.  He wants to have this done . HAve this done in [place] area    CT Chest data  No results found.    PFT  OV 08/03/2021  Subjective:  Patient ID: [name], male , DOB: 1940-05-24 , age 57 y.o. , MRN: [id number] , ADDRESS: [place] [id number] PCP [name], MD Patient Care Team: [name], MD as PCP - General (Family Medicine) Festus Aloe, MD as Consulting Physician (Urology) Michael Boston, MD as Consulting Physician (General Surgery) Stanford Breed Denice Bors, MD as Consulting Physician (Cardiology) Jodi Marble, MD as Consulting Physician (Otolaryngology) Deboraha Sprang, MD as Consulting Physician (Cardiology) Luberta Mutter, MD as  Consulting Physician (Ophthalmology) Collene Gobble, MD as Consulting Physician (Pulmonary Disease) Mauri Pole, MD as Consulting Physician (Gastroenterology) [name], [facility] (Pharmacist)  This Provider for this visit: Treatment Team:  Attending Provider: Brand Males, MD    08/03/2021 -   Chief Complaint  Patient presents with   Follow-up    Follow-up IPF cough, SOB, Wheezing    IPF dx given Sept 2022 -> started esbriet  COPD associated  Mild eosiniohphiulia in blood - 400clls Feb 2023  Drug-induced weight loss 2023 with pirfenidone and Jardiance  Chronic fluctuating diarrhea and constipation  GErd on ppi  Chronic systolic heart failure  -9480 ejection fraction 25%  -2021 in May 2023 ejection fraction 30-35%  HPI [name] 81 y.o. -+ returns for follow-up.  Since his last visit he continues to be stable from a dyspnea standpoint.  Is undergoing pulmonary rehabilitation.  On the pulmonary function test is a little bit waxing and waning but I would say the overall trend in the last 1 year appears stable although definitely progressive in the last 3 years.  It appears that since starting pirfenidone he is likely stable.  He is on the lower dose pirfenidone.  The main issue is that he is struggling with the pirfenidone.  His fatigue is a little more than usual.  He is also having alternating constipation and diarrhea.  However he is able to be stable on this.  Also he says that since attending rehabilitation his got some right infrascapular infra axillary muscle pull symptoms.  He is wondering if it is because of rehabilitation.  His CT scan late last year did not show any abnormalities consistent with a mass.  He has had weight loss because of pirfenidone and  also Jardiance.  Although he feels this is stabilized.  He feels it is in a happy point.  He had an echocardiogram and his ejection fraction is 35%.     Symptoms:  SYMPTOM SCALE - ILD 11/08/2020  12/22/2020 Esbriet since sept/pct 2022 01/19/2021 Esbreit 2 pills tid 03/09/2021  05/07/2021  08/03/2021   Current weight 209# 207# 206# 199# 194# 190# - esbriet  O2 use ra ra      Shortness of Breath 0 -> 5 scale with 5 being worst (score 6 If unable to do)       At rest 0 0 0 0 0 0  0Simple tasks - showers, clothes change, eating, shaving 0 0 0 0 0 0  Household (dishes, doing bed, laundry) 0 0 0 0 0 0  Shopping 3 2 0 _0 Walking level at own pace _1 Walking up Stairs _2 Total (30-36) Dyspnea Score 12 x 2 years  same Since onset _3 How bad is your cough? moderate x  2years. Getting worse. Has light yellow/green sputum. Clears throat _4 How bad is your fatigue light _5 How bad is nausea 0 _6 0 3  How bad is vomiting?  0 0 0 0 0 0  How bad is diarrhea? 0 _7 How bad is anxiety? 0 _8 0 2  How bad is depression 0 1 2 0 0 0  Any chronic pain - if so where and how bad 0     x     Simple office walk 185 feet x  3 laps goal with forehead probe 10/05/2020  03/09/2021  08/03/2021   O2 used ra ra ra  Number laps completed _9 Comments about pace avg avg nl  Resting Pulse Ox/HR 100% and 70/min 99% and 70 97% and HR 60  Final Pulse Ox/HR 97% and 82/min 97% and 82 98% and HR 72  Desaturated </= 88% no no np  Desaturated <= 3% points yes no no  Got Tachycardic >/= 90/min no no no  Symptoms at end of test Mild dyspnea no no  Miscellaneous comments x x    PFT     Latest Ref Rng & Units 08/03/2021    2:41 PM 01/19/2021   12:54 PM 09/27/2020    9:51 AM 02/09/2018    1:53 PM 03/12/2017    1:55 PM 02/08/2015    9:53 AM  PFT Results  FVC-Pre L 2.43  P 2.67  2.34  3.02  2.82  3.20   FVC-Predicted Pre % 57  P 63  55  69  64  71   FVC-Post L   2.44  2.97  2.78  3.19   FVC-Predicted Post %   57  67  63  71   Pre FEV1/FVC % % 87  P 82  84  82  82  82   Post FEV1/FCV % %   82  82  83  82   FEV1-Pre L 2.11  P 2.19  1.98  2.48  2.30  2.61    FEV1-Predicted Pre % 69  P 72  65  78  72  80   FEV1-Post L   2.00  2.42  2.30  2.61   DLCO uncorrected ml/min/mmHg 14.82  P 14.09  14.62  18.10  18.46  19.06   DLCO UNC% % 58  P 55  57  53  54  56   DLCO corrected ml/min/mmHg 14.82  P 14.09  14.62  17.90     DLCO COR %Predicted % 58  P 55  57  53     DLVA Predicted % 103  P 93  101  90  95  90   TLC L   4.17  4.65  4.94  4.59   TLC % Predicted %   57  64  68  63   RV % Predicted %   46  56  68  44     P Preliminary result       has a past medical history of Adenomatous colon polyp (2009; 06/2012), AICD (automatic cardioverter/defibrillator) present, Atrial fibrillation ([place]), Bilateral sensorineural hearing loss, BPH (benign prostatic hypertrophy), CAD (coronary artery disease), CHF (congestive heart failure) ([place]), Chronic constipation, Chronic rhinitis, COPD (chronic obstructive pulmonary disease) ([place]), Diverticulosis of colon (2005), Gallstones (06/2014), GERD (gastroesophageal reflux disease), Hepatic steatosis (2010; 2016), Hyperlipidemia, Hypertension, Ischemic cardiomyopathy, Microscopic hematuria, Nephrolithiasis, Obstructive sleep apnea, Presence of permanent cardiac pacemaker, Squamous cell carcinoma of back, and Ventricular tachycardia ([place]).   reports that he quit smoking about 41 years ago. His smoking use included cigarettes. He has a 30.00 pack-year smoking history. He has never used smokeless tobacco.  Past Surgical History:  Procedure Laterality Date   ATRIAL ABLATION SURGERY     flutter ablation 2002   BREAST SURGERY Left 1990   "Tissue removed from breast"   CARDIAC CATHETERIZATION     CARDIOVERSION N/A 05/10/2020   Procedure: CARDIOVERSION;  Surgeon: Sanda Klein, MD;  Location: [facility] ENDOSCOPY;  Service: Cardiovascular;  Laterality: N/A;   carotid doppler  09/25/12   No signif stenosis   COLONOSCOPY N/A 07/13/2012   Diverticula ascending colon.  Polyp x 1-recall 5 yrs (Dr. Watt Climes) Procedure: COLONOSCOPY;  Surgeon:  Jeryl Columbia, MD;  Location: [facility] ENDOSCOPY;  Service: Endoscopy;  Laterality: N/A; polypectomy x 1    COLONOSCOPY WITH PROPOFOL N/A 08/18/2015   Procedure: COLONOSCOPY WITH PROPOFOL;  Surgeon: Mauri Pole, MD;  Location: [facility] ENDOSCOPY;  Service: Endoscopy;  Laterality: N/A;   ESOPHAGOGASTRODUODENOSCOPY N/A 07/13/2012   Procedure: ESOPHAGOGASTRODUODENOSCOPY (EGD);  Surgeon: Jeryl Columbia, MD;  Location: [facility];  Service: Endoscopy;  Laterality: N/A;  NORMAL   EXTRACORPOREAL SHOCK WAVE LITHOTRIPSY     HEMORRHOID BANDING     ICD     a) Guidant Contak H170- Virl Axe, MD 07/25/06 (second device) b) Medtronic CRT-D   ICD GENERATOR CHANGEOUT N/A 02/14/2020   Procedure: ICD GENERATOR CHANGEOUT;  Surgeon: Deboraha Sprang, MD;  Location: [facility];  Service: Cardiovascular;  Laterality: N/A;   INGUINAL HERNIA REPAIR Bilateral 1975   LEAD REVISION Bilateral 12/26/2011   Procedure: LEAD REVISION;  Surgeon: Evans Lance, MD;  [facility];  Service: Cardiovascular;  Laterality: Bilateral;   PFTs  02/2015   Mod restriction (interstitial), mod diffusion defect (Dr. Annamaria Boots)   precancer area keratsis removed  05/2015    leftf orehead and left arm and right arm and back    Allergies  Allergen Reactions   Sulfa Antibiotics Rash    Immunization History  Administered Date(s) Administered   Fluad Quad(high Dose 65+) 09/23/2018, 10/05/2020   Influenza Split 11/11/2010, 11/02/2012, 10/14/2013   Influenza Whole 09/27/2011, 11/13/2011   Influenza, High Dose Seasonal PF 09/26/2015, 10/07/2016  Influenza, Seasonal, Injecte, Preservative Fre 09/23/2018   Influenza,inj,Quad PF,6+ Mos 11/03/2016, 09/20/2017   Influenza-Unspecified 09/26/2014, 10/15/2019   PFIZER Comirnaty(Gray Top)Covid-19 Tri-Sucrose Vaccine 04/24/2020   PFIZER(Purple Top)SARS-COV-2 Vaccination 02/02/2019, 02/22/2019, 10/26/2019, 09/29/2020   Pneumococcal Conjugate-13 06/15/2014   Pneumococcal Polysaccharide-23  03/15/2002, 07/10/2015   Td 08/27/2017   Tdap 04/04/2010   Zoster Recombinat (Shingrix) 03/29/2016   Zoster, Live 05/15/2015    Family History  Problem Relation Age of Onset   Heart disease Mother    Other Mother        colon surgery for fistula   Gallstones Mother    Breast cancer Maternal Grandmother    Coronary artery disease Father        family hx of   Heart attack Father        4 stents/ pacemaker   Nephrolithiasis Father    Hypertension Sister    Goiter Paternal Grandmother    Nephrolithiasis Son    Colon cancer Neg Hx      Current Outpatient Medications:    Budeson-Glycopyrrol-Formoterol (BREZTRI AEROSPHERE) 160-9-4.8 MCG/ACT AERO, Inhale 2 puffs into the lungs in the morning and at bedtime., Disp: 10.7 g, Rfl: 6   levalbuterol (XOPENEX HFA) 45 MCG/ACT inhaler, Inhale 2 puffs into the lungs every 4 (four) hours as needed for wheezing., Disp: 1 each, Rfl: 4   loratadine (CLARITIN) 10 MG tablet, Take 10 mg by mouth in the morning., Disp: , Rfl:    Pirfenidone (ESBRIET) 267 MG TABS, Take 2 tablets (534 mg total) by mouth with breakfast, with lunch, and with evening meal., Disp: 540 tablet, Rfl: 1   antiseptic oral rinse (BIOTENE) LIQD, 15 mLs by Mouth Rinse route in the morning and at bedtime., Disp: , Rfl:    carvedilol (COREG) 3.125 MG tablet, Take 1 tablet (3.125 mg total) by mouth 2 (two) times daily with a meal., Disp: 180 tablet, Rfl: 3   cholecalciferol (VITAMIN D) 1000 units tablet, Take 1,000 Units by mouth every evening., Disp: , Rfl:    empagliflozin (JARDIANCE) 10 MG TABS tablet, Take 1 tablet (10 mg total) by mouth daily before breakfast., Disp: 90 tablet, Rfl: 3   eplerenone (INSPRA) 25 MG tablet, TAKE 1 TABLET(25 MG) BY MOUTH DAILY, Disp: 90 tablet, Rfl: 3   furosemide (LASIX) 40 MG tablet, Take 2 tablets (80 mg total) by mouth daily., Disp: 180 tablet, Rfl: 3   Ginger, Zingiber officinalis, (GINGER ROOT) 550 MG CAPS, Take 1 capsule by mouth daily., Disp: ,  Rfl:    ipratropium (ATROVENT) 0.06 % nasal spray, Place 2 sprays into both nostrils 3 (three) times daily., Disp: 15 mL, Rfl: 1   Magnesium 250 MG TABS, Take 250 mg by mouth every evening., Disp: , Rfl:    mexiletine (MEXITIL) 150 MG capsule, TAKE 2 CAPSULES(300 MG) BY MOUTH TWICE DAILY, Disp: 360 capsule, Rfl: 2   Multiple Vitamin (MULTIVITAMIN WITH MINERALS) TABS, Take 1 tablet by mouth every evening., Disp: , Rfl:    nitroGLYCERIN (NITROSTAT) 0.4 MG SL tablet, DISSOLVE 1 TABLET UNDER THE TONGUE EVERY 5 MINUTES AS NEEDED FOR CHEST PAIN, Disp: 25 tablet, Rfl: 5   Olopatadine HCl (PATADAY OP), Apply 1 drop to eye daily., Disp: , Rfl:    Omeprazole 20 MG TBEC, Take 1 tablet (20 mg total) by mouth daily., Disp: 90 tablet, Rfl: 3   polyvinyl alcohol-povidone (HYPOTEARS) 1.4-0.6 % ophthalmic solution, Place 1-2 drops into both eyes in the morning and at bedtime., Disp: , Rfl:    potassium  chloride SA (KLOR-CON M) 20 MEQ tablet, Take 1 tablet (20 mEq total) by mouth daily with lunch., Disp: 90 tablet, Rfl: 3   rivaroxaban (XARELTO) 20 MG TABS tablet, TAKE 1 TABLET(20 MG) BY MOUTH DAILY WITH SUPPER, Disp: 90 tablet, Rfl: 3   rosuvastatin (CRESTOR) 40 MG tablet, TAKE 1 TABLET(40 MG) BY MOUTH EVERY EVENING, Disp: 90 tablet, Rfl: 3   sacubitril-valsartan (ENTRESTO) 24-26 MG, Take 1 tablet by mouth 2 (two) times daily., Disp: 180 tablet, Rfl: 3   Simethicone (GAS-X PO), Take by mouth as directed., Disp: , Rfl:       Objective:   Vitals:   08/03/21 1529  BP: (!) 80/50  Pulse: 60  Temp: 97.6 F (36.4 C)  TempSrc: Oral  SpO2: 97%  Weight: 190 lb (86.2 kg)  Height: _0  (1.778 m)    Estimated body mass index is 27.26 kg/m as calculated from the following:   Height as of this encounter: _1  (1.778 m).   Weight as of this encounter: 190 lb (86.2 kg).  _2 @  [employer]   08/03/21 1529  Weight: 190 lb (86.2 kg)     Physical Exam    General: No distress. Some more  weightt loss Neuro: Alert and Oriented x 3. GCS 15. Speech normal Psych: Pleasant Resp:  Barrel Chest - no.  Wheeze - n, Crackles - YES BASE, No overt respiratory distress CVS: Normal heart sounds. Murmurs - no Ext: Stigmata of Connective Tissue Disease - no HEENT: Normal upper airway. PEERL +. No post nasal drip        Assessment:       ICD-10-CM   1. IPF (idiopathic pulmonary fibrosis) (HCC)  J84.112 CT Chest High Resolution    2. Drug-induced weight loss  R63.4    T50.905A     3. Medication monitoring encounter  Z51.81 Hepatic function panel    Hepatic function panel    CANCELED: Hepatic function panel         Plan:     Patient Instructions  Pulmnary Fibrosis with emphysema ILD (interstitial lung disease) ([place]) IPF (idiopathic pulmonary fibrosis) (HCC)  - IPF is  likely clinically stable past 1 year with low dose ebreit through progressed in 3 years  - though on PFT some confusion if is worse in 6 months  - need CT scan to denote progression better - -Currently on Esbriet 2 pills 3 times daily   -  being capped because baseline mexilitine can increase esbriet levels  - esbiret contributing to fatigue   Plan - check LFT 08/03/2021 - continue esbriet 2 pills x 3 times daily  - controlling GI symptoms with omeprazole, miralax, ginger regimen as needed - Continue pulmonary rehab - get HRCT supine and prone in sept 2023   - to be done at [facility]  - 1 year followup  - appreciate clinical trial interst in future but holding off for now  Rt side infra-axillary and infra-scapular musculoskeletal pain - since march 2023  - afer starting rehab  Plan  - we can see if CT chest sept 2023 sheds anylight  Chronic obstructive pulmonary disease, unspecified COPD type ([place])  - not in flare up  Plan  - cotninue breztri daily - albueo as needed - refill clotrimazole for thrush as needed  GERD - appears omeprazole helping you  Plan  - continue omeprazole on  empty stomach - as it has potential beneficial effect for IPF  Weight loss- drug induced Low appetite   -  ongoing weight loss  due to esbriet and jardiance  but seems stabilized  Plan  -monitor; ok to tolerate weight loss till 170- 175#   Followup - sept 2023 but after CT  - 30 min visit; symptoms score and walk test at followup   High risk medical condition requiring intensive therapeutic monitoring  SIGNATURE    Dr. Brand Males, M.D., F.C.C.P,  Pulmonary and Critical Care Medicine Staff Physician, [place] Director - Interstitial Lung Disease  Program  Pulmonary [place] at [place], [place], [address]  Pager: [phone], If no answer or between  15:00h - 7:00h: call [phone] Telephone: [phone]  5:28 PM 08/03/2021

## 2021-08-03 NOTE — Progress Notes (Signed)
Spirometry/DLCO performed today.

## 2021-08-03 NOTE — Patient Instructions (Signed)
Spirometry/DLCO performed today.

## 2021-08-04 LAB — HEPATIC FUNCTION PANEL
AG Ratio: 1.7 (calc) (ref 1.0–2.5)
ALT: 28 U/L (ref 9–46)
AST: 26 U/L (ref 10–35)
Albumin: 3.8 g/dL (ref 3.6–5.1)
Alkaline phosphatase (APISO): 57 U/L (ref 35–144)
Bilirubin, Direct: 0.1 mg/dL (ref 0.0–0.2)
Globulin: 2.2 g/dL (calc) (ref 1.9–3.7)
Indirect Bilirubin: 0.4 mg/dL (calc) (ref 0.2–1.2)
Total Bilirubin: 0.5 mg/dL (ref 0.2–1.2)
Total Protein: 6 g/dL — ABNORMAL LOW (ref 6.1–8.1)

## 2021-08-08 ENCOUNTER — Telehealth: Payer: Self-pay | Admitting: Internal Medicine

## 2021-08-09 LAB — PULMONARY FUNCTION TEST
DL/VA % pred: 103 %
DL/VA: 4.02 ml/min/mmHg/L
DLCO cor % pred: 58 %
DLCO cor: 14.82 ml/min/mmHg
DLCO unc % pred: 58 %
DLCO unc: 14.82 ml/min/mmHg
FEF 25-75 Pre: 3.26 L/sec
FEF2575-%Pred-Pre: 153 %
FEV1-%Pred-Pre: 69 %
FEV1-Pre: 2.11 L
FEV1FVC-%Pred-Pre: 121 %
FEV6-%Pred-Pre: 61 %
FEV6-Pre: 2.43 L
FEV6FVC-%Pred-Pre: 106 %
FVC-%Pred-Pre: 57 %
FVC-Pre: 2.43 L
Pre FEV1/FVC ratio: 87 %
Pre FEV6/FVC Ratio: 100 %

## 2021-08-09 MED ORDER — CLOTRIMAZOLE 10 MG MT TROC: OROMUCOSAL | 1 refills | Status: DC

## 2021-08-09 NOTE — Telephone Encounter (Signed)
Called patient and he states at last visit he was told that he could refill his Clotrimazole. And I see it on the AVS. But I did not see it on medication list. Lessie Dings can you give me the strength and dosage to this medication. I was unsure if you wanted the tablet or the liquid.   Please advise sir

## 2021-08-09 NOTE — Telephone Encounter (Signed)
#  Oral thrush -   Clotrimazole troche (throt lozenge) 48m dissolved and swish and swallow 5 times a day for 14 days - with 6 refill.s But  pls ask if that is the dose he is on. If not pls ask him what he is on and send infor back to me

## 2021-08-09 NOTE — Telephone Encounter (Signed)
Called and spoke with patient. He is aware that MR has approved the refill. RX has been sent.   Nothing further needed at time of call.

## 2021-08-15 NOTE — Progress Notes (Deleted)
Chronic Care Management Pharmacy Note  08/15/2021 Name:  KATO WIECZOREK MRN:  [id number] DOB:  08-06-1940 Subjective: TIELER COURNOYER is an 81 y.o. year old male who is a primary patient of Tabori, [name], MD.  The CCM team was consulted for assistance with disease management and care coordination needs.    Engaged with patient by telephone for follow up visit in response to provider referral for pharmacy case management and/or care coordination services.   Consent to Services:  The patient was given information about Chronic Care Management services, agreed to services, and gave verbal consent prior to initiation of services.  Please see initial visit note for detailed documentation.   Patient Care Team: [name], MD as PCP - General (Family Medicine) Festus Aloe, MD as Consulting Physician (Urology) Michael Boston, MD as Consulting Physician (General Surgery) [name], MD as Consulting Physician (Cardiology) Jodi Marble, MD as Consulting Physician (Otolaryngology) Deboraha Sprang, MD as Consulting Physician (Cardiology) Luberta Mutter, MD as Consulting Physician (Ophthalmology) Lamonte Sakai Rose Fillers, MD as Consulting Physician (Pulmonary Disease) Mauri Pole, MD as Consulting Physician (Gastroenterology) [name], [facility] (Pharmacist)  Recent office visits:  11/16/20 [name], MD (PCP) - Family Medicine - Hypertension - Labs were ordered. Referrlal to  Pulmonology placed. IF you have worsening dizziness or falls- decrease your Coreg to 1/2 tab twice daily. Follow up in 6 months.   Recent consult visits:  12/06/20 [name], NP - IPF - Pulmonology - levalbuterol [facility] HFA) 45 MCG/ACT inhaler Inhale 2 puffs into the lungs every 4 (four) hours as needed for wheezing prescribed. Labs ordered. Imodium AD  if needed for diarrhea . Labs in 4 weeks. Follow up in 6 weeks.    11/23/20 [name], NP - IPF - Pulmonology (Video Visit) -  No medication changes. Follow up in 2 weeks for labs.   11/08/20 Brand Males, MD - Pulmonololgy - IPF - Referral to Pulmonary rehab. Start esbiret per protocol 11/14/20. Cotninue breztri daily and albueo as needed. Follow up in 2 weeks.   Objective:  Lab Results  Component Value Date   CREATININE 1.01 05/21/2021   CREATININE 0.94 04/06/2021   CREATININE 0.99 03/02/2021    Lab Results  Component Value Date   HGBA1C 6.0 11/17/2020   Last diabetic Eye exam: No results found for: "HMDIABEYEEXA"  Last diabetic Foot exam: No results found for: "HMDIABFOOTEX"      Component Value Date/Time   CHOL 140 05/21/2021 1534   TRIG 101.0 05/21/2021 1534   HDL 39.20 05/21/2021 1534   CHOLHDL 4 05/21/2021 1534   VLDL 20.2 05/21/2021 1534   LDLCALC 80 05/21/2021 1534   LDLCALC 169 (H) 06/11/2017 1637   LDLDIRECT 168.0 07/12/2015 0928       Latest Ref Rng & Units 08/03/2021    4:26 PM 05/21/2021    3:34 PM 04/06/2021    3:44 PM  Hepatic Function  Total Protein 6.1 - 8.1 g/dL 6.0  6.4  6.6   Albumin 3.5 - 5.2 g/dL  3.9    AST 10 - 35 U/L _0 ALT 9 - 46 U/L _1 Alk Phosphatase 39 - 117 U/L  57    Total Bilirubin 0.2 - 1.2 mg/dL 0.5  0.6  0.7   Bilirubin, Direct 0.0 - 0.2 mg/dL 0.1  0.1      Lab Results  Component Value Date/Time   TSH  0.88 05/21/2021 03:34 PM   TSH 1.67 11/16/2020 10:34 AM       Latest Ref Rng & Units 05/21/2021    3:34 PM 01/19/2021    2:53 PM 11/16/2020   10:34 AM  CBC  WBC 4.0 - 10.5 K/uL 7.7  7.5  7.3   Hemoglobin 13.0 - 17.0 g/dL 15.7  14.8  15.1   Hematocrit 39.0 - 52.0 % 46.4  45.1  44.7   Platelets 150.0 - 400.0 K/uL 286.0  282.0  280.0     Lab Results  Component Value Date/Time   VD25OH 38.99 11/16/2020 10:34 AM   VD25OH 37.42 11/17/2019 11:11 AM   Clinical ASCVD: Yes  The ASCVD Risk score (Arnett DK, et al., 2019) failed to calculate for the following reasons:   The valid systolic blood pressure range is 90 to 200 mmHg     Other: (CHADS2VASc if Afib, PHQ9 if depression, MMRC or CAT for COPD, ACT, DEXA)  Social History   Tobacco Use  Smoking Status Former   Packs/day: 2.00   Years: 15.00   Total pack years: 30.00   Types: Cigarettes   Quit date: 01/15/1980   Years since quitting: 41.6  Smokeless Tobacco Never   BP Readings from Last 3 Encounters:  08/03/21 (!) 80/50  06/20/21 114/60  06/20/21 90/60   Pulse Readings from Last 3 Encounters:  08/03/21 60  06/20/21 89  06/20/21 60   Wt Readings from Last 3 Encounters:  08/03/21 190 lb (86.2 kg)  06/20/21 190 lb 2 oz (86.2 kg)  06/20/21 190 lb (86.2 kg)    Assessment: Review of patient past medical history, allergies, medications, health status, including review of consultants reports, laboratory and other test data, was performed as part of comprehensive evaluation and provision of chronic care management services.   SDOH:  (Social Determinants of Health) assessments and interventions performed: Yes   CCM Care Plan  Allergies  Allergen Reactions   Sulfa Antibiotics Rash    Medications Reviewed Today     Reviewed by Alvin Critchley, [place] (Certified Medical Assistant) on 08/03/21 at 1532  Med List Status: <None>   Medication Order Taking? Sig Documenting Provider Last Dose Status Informant  antiseptic oral rinse (BIOTENE) LIQD [id number]  15 mLs by Mouth Rinse route in the morning and at bedtime. [provider]  Active Self  Budeson-Glycopyrrol-Formoterol (BREZTRI AEROSPHERE) 160-9-4.8 MCG/ACT AERO [id number] Yes Inhale 2 puffs into the lungs in the morning and at bedtime. Parrett, Fonnie Mu, NP Taking Active   carvedilol (COREG) 3.125 MG tablet [id number]  Take 1 tablet (3.125 mg total) by mouth 2 (two) times daily with a meal. [name], MD  Active   cholecalciferol (VITAMIN D) 1000 units tablet [id number]  Take 1,000 Units by mouth every evening. [provider]  Active Self  empagliflozin (JARDIANCE) 10 MG TABS  tablet [id number]  Take 1 tablet (10 mg total) by mouth daily before breakfast. [name], MD  Active   eplerenone (INSPRA) 25 MG tablet [id number]  TAKE 1 TABLET(25 MG) BY MOUTH DAILY Stanford Breed Denice Bors, MD  Active   furosemide (LASIX) 40 MG tablet [id number]  Take 2 tablets (80 mg total) by mouth daily. [name], MD  Active   Ginger, Zingiber officinalis, (GINGER ROOT) 550 MG CAPS [id number]  Take 1 capsule by mouth daily. [provider]  Active   ipratropium (ATROVENT) 0.06 % nasal spray [id number]  Place 2 sprays into both nostrils 3 (three) times daily. Byrum,  Rose Fillers, MD  Active   levalbuterol [facility] HFA) 45 MCG/ACT inhaler [id number] Yes Inhale 2 puffs into the lungs every 4 (four) hours as needed for wheezing. Parrett, Fonnie Mu, NP Taking Active   loratadine (CLARITIN) 10 MG tablet [id number] Yes Take 10 mg by mouth in the morning. [provider] Taking Active Self  Magnesium 250 MG TABS [id number]  Take 250 mg by mouth every evening. [provider]  Active Self  mexiletine (MEXITIL) 150 MG capsule [id number]  TAKE 2 CAPSULES(300 MG) BY MOUTH TWICE DAILY Crenshaw, Denice Bors, MD  Active   Multiple Vitamin (MULTIVITAMIN WITH MINERALS) TABS [id number]  Take 1 tablet by mouth every evening. [provider]  Active Self  nitroGLYCERIN (NITROSTAT) 0.4 MG SL tablet [id number]  DISSOLVE 1 TABLET UNDER THE TONGUE EVERY 5 MINUTES AS NEEDED FOR CHEST PAIN [name], MD  Active   Olopatadine HCl (PATADAY OP) [id number]  Apply 1 drop to eye daily. [provider]  Active   Omeprazole 20 MG TBEC [id number]  Take 1 tablet (20 mg total) by mouth daily. [name], MD  Active   Pirfenidone (ESBRIET) 267 MG TABS [id number] Yes Take 2 tablets (534 mg total) by mouth with breakfast, with lunch, and with evening meal. Brand Males, MD Taking Active   polyvinyl alcohol-povidone (HYPOTEARS) 1.4-0.6 % ophthalmic solution [id number]  Place 1-2 drops  into both eyes in the morning and at bedtime. [provider]  Active Self  potassium chloride SA (KLOR-CON M) 20 MEQ tablet [id number]  Take 1 tablet (20 mEq total) by mouth daily with lunch. [name], MD  Active   rivaroxaban (XARELTO) 20 MG TABS tablet [id number]  TAKE 1 TABLET(20 MG) BY MOUTH DAILY WITH SUPPER [name], MD  Active   rosuvastatin (CRESTOR) 40 MG tablet [id number]  TAKE 1 TABLET(40 MG) BY MOUTH EVERY EVENING Crenshaw, Denice Bors, MD  Active   sacubitril-valsartan (ENTRESTO) 24-26 MG [id number]  Take 1 tablet by mouth 2 (two) times daily. [name], MD  Active   Simethicone (GAS-X PO) [id number]  Take by mouth as directed. [provider]  Active             Patient Active Problem List   Diagnosis Date Noted   Physical deconditioning 04/06/2021   IPF (idiopathic pulmonary fibrosis) ([place]) 11/16/2020   Encounter for follow-up surveillance of colon cancer    Benign neoplasm of descending colon    Pulmonary nodules 05/18/2015   Rectal bleeding 03/29/2015   Internal hemorrhoid, bleeding 03/29/2015   COPD (chronic obstructive pulmonary disease) ([place]) 03/28/2015   Gastroesophageal reflux disease 03/17/2015   Prostate cancer screening 03/09/2014   Thoracic aortic aneurysm ([place]) 09/28/2013   Encounter for therapeutic drug monitoring 03/03/2013   Constipation, chronic 36/14/4315   Umbilical hernia with intermittent pain 10/21/2012   Obesity (BMI 30-39.9) 10/21/2012   TIA (transient ischemic attack) 09/25/2012   Other malaise and fatigue 08/25/2012   Vitamin D deficiency 07/28/2012   CHF (congestive heart failure) ([place]) 07/28/2012   Chronotropic incompetence 10/25/2011   History of non anemic vitamin B12 deficiency 05/29/2011   Mild cognitive impairment 01/13/2011   Ringing in ears 01/13/2011   CAD (coronary artery disease) 09/14/2010   Elevated transaminase level--on amiodarone 06/19/2010   Biventricular implantable  cardioverter-defibrillator in situ 05/16/2010   Long term (current) use of anticoagulants 04/11/2010   Microscopic hematuria 04/04/2010   CLAUDICATION 11/28/2009   Atrial fibrillation ([place]) 06/29/2009   Chronic combined systolic  and diastolic heart failure ([place]) 05/26/2009   Ischemic cardiomyopathy 07/07/2008   VENTRICULAR TACHYCARDIA 06/29/2008   Hyperlipidemia 01/04/2008   Essential hypertension 01/04/2008   Chronic rhinitis 03/04/2007   Obstructive sleep apnea 03/02/2007    Immunization History  Administered Date(s) Administered   Fluad Quad(high Dose 65+) 09/23/2018, 10/05/2020   Influenza Split 11/11/2010, 11/02/2012, 10/14/2013   Influenza Whole 09/27/2011, 11/13/2011   Influenza, High Dose Seasonal PF 09/26/2015, 10/07/2016   Influenza, Seasonal, Injecte, Preservative Fre 09/23/2018   Influenza,inj,Quad PF,6+ Mos 11/03/2016, 09/20/2017   Influenza-Unspecified 09/26/2014, 10/15/2019   PFIZER Comirnaty(Gray Top)Covid-19 Tri-Sucrose Vaccine 04/24/2020   PFIZER(Purple Top)SARS-COV-2 Vaccination 02/02/2019, 02/22/2019, 10/26/2019, 09/29/2020   Pneumococcal Conjugate-13 06/15/2014   Pneumococcal Polysaccharide-23 03/15/2002, 07/10/2015   Td 08/27/2017   Tdap 04/04/2010   Zoster Recombinat (Shingrix) 03/29/2016   Zoster, Live 05/15/2015   Conditions to be addressed/monitored: Atrial Fibrillation, CHF, HTN, HLD, and Hypertriglyceridemia  Patient Care Plan: General Pharmacy (Adult)     Problem Identified: Atrial Fibrillation, CHF, HTN, HLD, and Hypertriglyceridemia   Priority: High  Onset Date: 02/15/2021     Long-Range Goal: Patient-Specific Goal   Start Date: 02/15/2021  Expected End Date: 08/15/2021  This Visit's Progress: On track  Priority: High  Note:   Current Barriers:  High cost medications  Pharmacist Clinical Goal(s):  Patient will verbalize ability to afford treatment regimen achieve improvement in fatigue as evidenced by symptoms through collaboration with  PharmD and provider.   Interventions: 1:1 collaboration with [name], MD regarding development and update of comprehensive plan of care as evidenced by provider attestation and co-signature Inter-disciplinary care team collaboration (see longitudinal plan of care) Comprehensive medication review performed; medication list updated in electronic medical record  Hypertension, CHF (BP goal <130/80) -Controlled -Current treatment: Coreg 6.25 mg BID Entresto 24-26 mg BID Furosemide 40 mg 2 tabs daily  -Current home readings: none provided -Current dietary habits: low salt -Current exercise habits: no formal exercise. Limited due to chf copd ild  -Denies hypotensive/hypertensive symptoms -Educated on BP goals and benefits of medications for prevention of heart attack, stroke and kidney damage; -Counseled to monitor BP at home 3-4x/wk, document, and provide log at future appointments -Recommended to continue current medication Assessed patient finances. Helped patient apply for [employer] grant due to high copay with entresto - approved.  CPA to mail grant information to patient and contact pharmacy w/ grant information.   Hyperlipidemia: (LDL goal < 70) -Not ideally controlled -Current treatment: Rosuvastatin 40 mg once daily Appropriate, Effective, Safe, Accessible -Medications previously tried: previously noted rash with crestor, tolerating well. No side effects noted  -Educated on Cholesterol goals;  -Recommended to continue current medication  Update 02/15/21 Most recent LDL well controlled. Denies any more issues with Crestor. Adherence 100% - continue current meds. Recheck lipids at next routine check up.  Atrial Fibrillation (Goal: prevent stroke and major bleeding) -Not ideally controlled -CHADSVASC: 7 (>10% annual stroke risk) -Current treatment: Rate control: Coreg Anticoagulation: Xarelto -Medications previously tried: amiodarone -Home BP and HR readings:  none provided  -Counseled on increased risk of stroke due to Afib and benefits of anticoagulation for stroke prevention; importance of adherence to anticoagulant exactly as prescribed; -Assessed patient finances. Based on estimated finances, unlikely to qualify for Xarelto PAP  COPD,  (Goal: control symptoms and prevent exacerbations) -Not ideally controlled New dgx of ILD, set to start Esbriet through pulm. Pharmacist there helped patient get approved for PAP.  -Current treatment  Xopenex prn  Appropriate, Effective, Safe, Accessible  Breztri 160-9-4.33mg Appropriate, Effective, Safe, Accessible Esbriet [id number] tablet three times per day  Appropriate, Effective, Safe, Accessible -Medications previously tried: symbicort, albuterol   -Gold Grade: Gold 2 (FEV1 50-79%) -Current COPD Classification:  B (high sx, <2 exacerbations/yr) -Pulmonary function testing: 09/2020  -Exacerbations requiring treatment in last 6 months: 0 -Patient reports consistent use of maintenance inhaler -Frequency of rescue inhaler use: ~2x/wk -Counseled on Proper inhaler technique; Benefits of consistent maintenance inhaler use When to use rescue inhaler Differences between maintenance and rescue inhalers -Recommended to continue current medication Assessed patient finances. Unlikely to qualify for assistance on Breztri  Update 02/14/21 Still using inhalers appropriately. Was taken off Esbriet temporarily due to LFTs and fatigue.  He has started it back taking one tablet three times daily with plans to work back to twice daily. Breztri and other meds currently affordable. Continue to monitor LFTs. Followed by pulm - continue routine check ups wit them on lung function.   Follow Up Plan: The care management team will reach out to the patient again over the next 180 days.      Patient Care Plan: RN Care Manager Plan of Care     Problem Identified: Chronic Disease Management and Care Coordination Needs  (COPD, Afib, CHF, idiopathic pulmonary fibrosis,HTN, HLD)   Priority: High     Long-Range Goal: Establish Plan of Care for Chronic Disease Management Needs (COPD, Afib, CHF, idiopathic pulmonary fibrosis,HTN, HLD)   Start Date: 04/25/2021  Expected End Date: 04/25/2022  Priority: High  Note:   Case closed goals met Current Barriers:  Knowledge Deficits related to plan of care for management of Atrial Fibrillation, CHF, HTN, HLD, COPD, and Idiopathic Pulmonary Fibrosis  Chronic Disease Management support and education needs related to Atrial Fibrillation, CHF, HTN, HLD, COPD, and Idiopathic Pulmonary Fibrosis   States he is completed pulmonary rehab and he is walking at home to get exercise.  States he always has a cough and he is using his inhalers as directed.  States since he started the [place] has lost weight and his weight is down to 185 now.  States he does weigh daily.  States he follows a low sodium healthy diet. States he is getting patient assistance for his expensive medications which helps a lot. States he knows when to call his doctors if he is having any issues  RNCM Clinical Goal(s):  Patient will verbalize understanding of plan for management of Atrial Fibrillation, CHF, HTN, HLD, COPD, and Idiopathic Pulmonary Fibrosis  as evidenced by voiced adherence to plan of care verbalize basic understanding of  Atrial Fibrillation, CHF, HTN, HLD, COPD, and Idiopathic Pulmonary Fibrosis  disease process and self health management plan as evidenced by voiced understanding and teach back  take all medications exactly as prescribed and will call provider for medication related questions as evidenced by dispense report and pt verbalization  attend all scheduled medical appointments: Dr. TaBirdie Riddle1/9/23, Pulmonary 08/03/21, Cardiology 12/19/21  as evidenced by medical records demonstrate Improved adherence to prescribed treatment plan for Atrial Fibrillation, CHF, HTN, HLD, COPD, and  Idiopathic Pulmonary Fibrosis  as evidenced by readings within limits, voiced adherence to plan of care continue to work with RN Care Manager to address care management and care coordination needs related to  Atrial Fibrillation, CHF, HTN, HLD, COPD, and Idiopathic Pulmonary Fibrosis  as evidenced by adherence to CM Team Scheduled appointments through collaboration with RN Care manager, provider, and care team.   Interventions: 1:1 collaboration with primary  care provider regarding development and update of comprehensive plan of care as evidenced by provider attestation and co-signature Inter-disciplinary care team collaboration (see longitudinal plan of care) Evaluation of current treatment plan related to  self management and patient's adherence to plan as established by provider   AFIB Interventions: (Status:  Goal Met.) Long Term Goal   Counseled on increased risk of stroke due to Afib and benefits of anticoagulation for stroke prevention Reviewed importance of adherence to anticoagulant exactly as prescribed Counseled on bleeding risk associated with Xarelto  and importance of self-monitoring for signs/symptoms of bleeding Afib action plan reviewed   Heart Failure Interventions:  (Status:  Goal Met.) Long Term Goal Basic overview and discussion of pathophysiology of Heart Failure reviewed Provided education on low sodium diet Reviewed Heart Failure Action Plan in depth and provided written copy Assessed need for readable accurate scales in home Provided education about placing scale on hard, flat surface Advised patient to weigh each morning after emptying bladder Discussed importance of daily weight and advised patient to weigh and record daily Discussed the importance of keeping all appointments with provider Screening for signs and symptoms of depression related to chronic disease state  Assessed social determinant of health barriers   COPD Interventions:  (Status:  Goal Met.)  Long Term Goal Provided patient with basic written and verbal COPD education on self care/management/and exacerbation prevention Advised patient to track and manage COPD triggers Provided instruction about proper use of medications used for management of COPD including inhalers Advised patient to self assesses COPD action plan zone and make appointment with provider if in the yellow zone for 48 hours without improvement Provided education about and advised patient to utilize infection prevention strategies to reduce risk of respiratory infection Discussed the importance of adequate rest and management of fatigue with COPD   Idiopathic Pulmonary Fibrosis   (Status:  Goal Met.)  Long Term Goal Evaluation of current treatment plan related to  Idiopathic Pulmonary Fibrosis  ,  Idiopathic Pulmonary Fibrosis   self-management and patient's adherence to plan as established by provider. Discussed plans with patient for ongoing care management follow up and provided patient with direct contact information for care management team Evaluation of current treatment plan related to Idiopathic Pulmonary Fibrosis  and patient's adherence to plan as established by provider Provided education to patient re: Idiopathic Pulmonary Fibrosis  Discussed plans with patient for ongoing care management follow up and provided patient with direct contact information for care management team  Hyperlipidemia Interventions:  (Status:  New goal.) Long Term Goal Medication review performed; medication list updated in electronic medical record.  Provider established cholesterol goals reviewed Counseled on importance of regular laboratory monitoring as prescribed Reviewed role and benefits of statin for ASCVD risk reduction Reviewed importance of limiting foods high in cholesterol  Hypertension Interventions:  (Status:  Goal Met.) Long Term Goal Last practice recorded BP readings:  BP Readings from Last 3 Encounters:   04/06/21 98/60  03/09/21 128/68  02/28/21 108/68  Most recent eGFR/CrCl:  Lab Results  Component Value Date   EGFR 73 05/08/2020    No components found for: CRCL  Evaluation of current treatment plan related to hypertension self management and patient's adherence to plan as established by provider Discussed plans with patient for ongoing care management follow up and provided patient with direct contact information for care management team Advised patient, providing education and rationale, to monitor blood pressure daily and record, calling PCP for findings outside established parameters  Patient Goals/Self-Care Activities: Take all medications as prescribed Attend all scheduled provider appointments Call pharmacy for medication refills 3-7 days in advance of running out of medications Perform all self care activities independently  Call provider office for new concerns or questions  call office if I gain more than 2 pounds in one day or 5 pounds in one week do ankle pumps when sitting track weight in diary watch for swelling in feet, ankles and legs every day weigh myself daily eat more whole grains, fruits and vegetables, lean meats and healthy fats identify and remove indoor air pollutants limit outdoor activity during cold weather listen for public air quality announcements every day attend pulmonary rehabilitation follow rescue plan if symptoms flare-up get at least 7 to 8 hours of sleep at night begin a symptom diary check pulse (heart) rate once a day take medicine as prescribed check blood pressure daily write blood pressure results in a log or diary call doctor for signs and symptoms of high blood pressure limit salt intake to 2362m/day call for medicine refill 2 or 3 days before it runs out take all medications exactly as prescribed call doctor with any symptoms you believe are related to your medicine  Follow Up Plan:  The patient has been provided with  contact information for the care management team and has been advised to call with any health related questions or concerns.  No further follow up required: Case closed goals met          Medication Assistance: None required.  Patient affirms current coverage meets needs.  [id number] [place] [facility] [address] [address]Phone: [phone]: [phone]  Follow Up:  Patient agrees to Care Plan and Follow-up.  Plan:  2 month BP call, 4 month CPP f/u call   Future Appointments  Date Time Provider DGibson 08/17/2021  7:00 AM [facility] DEVICE REMOTES [facility]  08/23/2021  9:00 AM BSFM-CCM PHARMACIST [facility] P[facility]  10/03/2021  3:00 PM DWB-CT 1 DWB-CT [facility]  11/08/2021 11:30 AM RBrand Males MD LBPU-PULCARE None  11/16/2021  7:00 AM [facility] DEVICE REMOTES [facility]  11/22/2021  2:20 PM TMidge Minium MD [facility] PEC  12/19/2021 10:40 AM CStanford BreedBDenice Bors MD CVD-HIGHPT None  02/15/2022  7:00 AM [facility] DEVICE REMOTES [facility]   CBeverly Milch PharmD Clinical Pharmacist  [facility]([phone]    Current Barriers:  High cost medications  Pharmacist Clinical Goal(s):  Patient will verbalize ability to afford treatment regimen achieve improvement in fatigue as evidenced by symptoms through collaboration with PharmD and provider.   Interventions: 1:1 collaboration with TMidge Minium MD regarding development and update of comprehensive plan of care as evidenced by provider attestation and co-signature Inter-disciplinary care team collaboration (see longitudinal plan of care) Comprehensive medication review performed; medication list updated in electronic medical record  Hypertension, CHF (BP goal <130/80) -Controlled -Current treatment: Coreg  6.25 mg BID Entresto 24-26 mg BID Furosemide 40 mg 2 tabs daily  -Current home readings: none provided -Current dietary habits: low salt -Current exercise habits: no formal exercise. Limited due to chf copd ild  -Denies hypotensive/hypertensive symptoms -Educated on BP goals and benefits of medications for prevention of heart attack, stroke and kidney damage; -Counseled to monitor BP at home 3-4x/wk, document, and provide log at future appointments -Recommended  to continue current medication Assessed patient finances. Helped patient apply for [employer] grant due to high copay with entresto - approved.  CPA to mail grant information to patient and contact pharmacy w/ grant information.   Hyperlipidemia: (LDL goal < 70) -Not ideally controlled -Current treatment: Rosuvastatin 40 mg once daily Appropriate, Effective, Safe, Accessible -Medications previously tried: previously noted rash with crestor, tolerating well. No side effects noted  -Educated on Cholesterol goals;  -Recommended to continue current medication  Update 02/15/21 Most recent LDL well controlled. Denies any more issues with Crestor. Adherence 100% - continue current meds. Recheck lipids at next routine check up.  Atrial Fibrillation (Goal: prevent stroke and major bleeding) -Not ideally controlled -CHADSVASC: 7 (>10% annual stroke risk) -Current treatment: Rate control: Coreg Anticoagulation: Xarelto -Medications previously tried: amiodarone -Home BP and HR readings: none provided  -Counseled on increased risk of stroke due to Afib and benefits of anticoagulation for stroke prevention; importance of adherence to anticoagulant exactly as prescribed; -Assessed patient finances. Based on estimated finances, unlikely to qualify for Xarelto PAP  COPD,  (Goal: control symptoms and prevent exacerbations) -Not ideally controlled New dgx of ILD, set to start Esbriet through pulm. Pharmacist there helped patient get  approved for PAP.  -Current treatment  Xopenex prn  Appropriate, Effective, Safe, Accessible Breztri 160-9-4.19mg Appropriate, Effective, Safe, Accessible Esbriet 2694mtwo tablet three times per day  Appropriate, Effective, Safe, Accessible -Medications previously tried: symbicort, albuterol   -Gold Grade: Gold 2 (FEV1 50-79%) -Current COPD Classification:  B (high sx, <2 exacerbations/yr) -Pulmonary function testing: 09/2020  -Exacerbations requiring treatment in last 6 months: 0 -Patient reports consistent use of maintenance inhaler -Frequency of rescue inhaler use: ~2x/wk -Counseled on Proper inhaler technique; Benefits of consistent maintenance inhaler use When to use rescue inhaler Differences between maintenance and rescue inhalers -Recommended to continue current medication Assessed patient finances. Unlikely to qualify for assistance on Breztri  Update 02/14/21 Still using inhalers appropriately. Was taken off Esbriet temporarily due to LFTs and fatigue.  He has started it back taking one tablet three times daily with plans to work back to twice daily. Breztri and other meds currently affordable. Continue to monitor LFTs. Followed by pulm - continue routine check ups wit them on lung function.   Follow Up Plan: The care management team will reach out to the patient again over the next 180 days.

## 2021-08-17 ENCOUNTER — Ambulatory Visit (INDEPENDENT_AMBULATORY_CARE_PROVIDER_SITE_OTHER): Payer: Medicare Other

## 2021-08-17 DIAGNOSIS — I255 Ischemic cardiomyopathy: Secondary | ICD-10-CM

## 2021-08-17 LAB — CUP PACEART REMOTE DEVICE CHECK
Battery Remaining Longevity: 114 mo
Battery Remaining Percentage: 100 %
Brady Statistic RA Percent Paced: 0 %
Brady Statistic RV Percent Paced: 96 %
Date Time Interrogation Session: 20230804012100
HighPow Impedance: 43 Ohm
Implantable Lead Implant Date: 20020527
Implantable Lead Implant Date: 20020527
Implantable Lead Implant Date: 20131212
Implantable Lead Location: 753858
Implantable Lead Location: 753859
Implantable Lead Location: 753860
Implantable Lead Model: 148
Implantable Lead Model: 4194
Implantable Lead Model: 6940
Implantable Lead Serial Number: 120244
Implantable Pulse Generator Implant Date: 20220131
Lead Channel Impedance Value: 645 Ohm
Lead Channel Impedance Value: 675 Ohm
Lead Channel Setting Pacing Amplitude: 2.5 V
Lead Channel Setting Pacing Amplitude: 2.5 V
Lead Channel Setting Pacing Pulse Width: 0.4 ms
Lead Channel Setting Pacing Pulse Width: 1 ms
Lead Channel Setting Sensing Sensitivity: 0.6 mV
Lead Channel Setting Sensing Sensitivity: 1 mV
Pulse Gen Serial Number: 149562

## 2021-08-23 ENCOUNTER — Telehealth: Payer: Self-pay

## 2021-08-29 ENCOUNTER — Telehealth: Payer: Medicare Other

## 2021-09-05 ENCOUNTER — Ambulatory Visit (INDEPENDENT_AMBULATORY_CARE_PROVIDER_SITE_OTHER): Payer: Medicare Other | Admitting: Pharmacist

## 2021-09-05 DIAGNOSIS — I4891 Unspecified atrial fibrillation: Secondary | ICD-10-CM

## 2021-09-05 DIAGNOSIS — I5022 Chronic systolic (congestive) heart failure: Secondary | ICD-10-CM

## 2021-09-05 DIAGNOSIS — I10 Essential (primary) hypertension: Secondary | ICD-10-CM

## 2021-09-05 NOTE — Patient Instructions (Addendum)
Visit Information   Goals Addressed             This Visit's Progress    Increase physical activity   On track      Patient Care Plan: General Pharmacy (Adult)     Problem Identified: Atrial Fibrillation, CHF, HTN, HLD, and Hypertriglyceridemia   Priority: High  Onset Date: 02/15/2021     Long-Range Goal: Patient-Specific Goal   Start Date: 02/15/2021  Expected End Date: 08/15/2021  Recent Progress: On track  Priority: High  Note:   Current Barriers:  High cost medications  Pharmacist Clinical Goal(s):  Patient will verbalize ability to afford treatment regimen achieve improvement in fatigue as evidenced by symptoms through collaboration with PharmD and provider.   Interventions: 1:1 collaboration with [name], MD regarding development and update of comprehensive plan of care as evidenced by provider attestation and co-signature Inter-disciplinary care team collaboration (see longitudinal plan of care) Comprehensive medication review performed; medication list updated in electronic medical record  Hypertension, CHF (BP goal <130/80) 09/05/21 -Controlled -Current treatment: Coreg 3.157m mg BID Appropriate, Effective, Safe, Accessible Entresto 24-26 mg BID Appropriate, Effective, Safe, Accessible Furosemide 40 mg 2 tabs daily Appropriate, Effective, Safe, Accessible Jardiance 167mAppropriate, Effective, Safe, Accessible -Current home readings: 137/80, 114/81, 124/74, 118/86, 126/77, 95/61, 108/60  Pulse - averages 68-69 -Current dietary habits: low salt -Current exercise habits: no formal exercise.  -Denies hypotensive/hypertensive symptoms -Educated on BP goals and benefits of medications for prevention of heart attack, stroke and kidney damage; -Recommended to continue current medication Assessed patient finances. He is having no concerns with medication prices at this time.  BP at home seems controlled.  He denies any dizziness or HA.  No swelling in  extremities.  No changes needed at this time.  Hyperlipidemia: (LDL goal < 70) -Not ideally controlled, not assessed -Current treatment: Rosuvastatin 40 mg once daily Appropriate, Effective, Safe, Accessible -Medications previously tried: previously noted rash with crestor, tolerating well. No side effects noted  -Educated on Cholesterol goals;  -Recommended to continue current medication  Update 02/15/21 Most recent LDL well controlled. Denies any more issues with Crestor. Adherence 100% - continue current meds. Recheck lipids at next routine check up.  Atrial Fibrillation (Goal: prevent stroke and major bleeding) 09/05/21 -Controlled -CHADSVASC: 7 (>10% annual stroke risk) -Current treatment: Rate control: Coreg 3.12553mppropriate, Effective, Safe, Accessible Anticoagulation: Xarelto 62m15mpropriate, Effective, Safe, Accessible -Medications previously tried: amiodarone -Home BP and HR readings: none provided  -Counseled on increased risk of stroke due to Afib and benefits of anticoagulation for stroke prevention; importance of adherence to anticoagulant exactly as prescribed; -Assessed finances, no issues with medication cost at this time. HR seems controlled - no changes needed.  COPD,  (Goal: control symptoms and prevent exacerbations) -Not ideally controlled, managed by pulm not assessed New dgx of ILD, set to start Esbriet through pulm. Pharmacist there helped patient get approved for PAP.  -Current treatment  Xopenex prn  Appropriate, Effective, Safe, Accessible Breztri 160-9-4.8mcg51mpropriate, Effective, Safe, Accessible Esbriet 267mg 24mtablet three times per day  Appropriate, Effective, Safe, Accessible -Medications previously tried: symbicort, albuterol   -Gold Grade: Gold 2 (FEV1 50-79%) -Current COPD Classification:  B (high sx, <2 exacerbations/yr) -Pulmonary function testing: 09/2020  -Exacerbations requiring treatment in last 6 months: 0 -Patient reports  consistent use of maintenance inhaler -Frequency of rescue inhaler use: ~2x/wk -Counseled on Proper inhaler technique; Benefits of consistent maintenance inhaler use When to use rescue inhaler Differences between maintenance  and rescue inhalers -Recommended to continue current medication Assessed patient finances. Unlikely to qualify for assistance on Breztri  Update 02/14/21 Still using inhalers appropriately. Was taken off Esbriet temporarily due to LFTs and fatigue.  He has started it back taking one tablet three times daily with plans to work back to twice daily. Breztri and other meds currently affordable. Continue to monitor LFTs. Followed by pulm - continue routine check ups wit them on lung function.   Follow Up Plan: The care management team will reach out to the patient again over the next 365 days.         Patient Care Plan: RN Care Manager Plan of Care     Problem Identified: Chronic Disease Management and Care Coordination Needs (COPD, Afib, CHF, idiopathic pulmonary fibrosis,HTN, HLD)   Priority: High     Long-Range Goal: Establish Plan of Care for Chronic Disease Management Needs (COPD, Afib, CHF, idiopathic pulmonary fibrosis,HTN, HLD)   Start Date: 04/25/2021  Expected End Date: 04/25/2022  Priority: High  Note:   Case closed goals met Current Barriers:  Knowledge Deficits related to plan of care for management of Atrial Fibrillation, CHF, HTN, HLD, COPD, and Idiopathic Pulmonary Fibrosis  Chronic Disease Management support and education needs related to Atrial Fibrillation, CHF, HTN, HLD, COPD, and Idiopathic Pulmonary Fibrosis   States he is completed pulmonary rehab and he is walking at home to get exercise.  States he always has a cough and he is using his inhalers as directed.  States since he started the [place] he has lost weight and his weight is down to 185 now.  States he does weigh daily.  States he follows a low sodium healthy diet.  States he is getting patient assistance for his expensive medications which helps a lot. States he knows when to call his doctors if he is having any issues  RNCM Clinical Goal(s):  Patient will verbalize understanding of plan for management of Atrial Fibrillation, CHF, HTN, HLD, COPD, and Idiopathic Pulmonary Fibrosis  as evidenced by voiced adherence to plan of care verbalize basic understanding of  Atrial Fibrillation, CHF, HTN, HLD, COPD, and Idiopathic Pulmonary Fibrosis  disease process and self health management plan as evidenced by voiced understanding and teach back  take all medications exactly as prescribed and will call provider for medication related questions as evidenced by dispense report and pt verbalization  attend all scheduled medical appointments: Dr. [name] 11/22/21, Pulmonary 08/03/21, Cardiology 12/19/21  as evidenced by medical records demonstrate Improved adherence to prescribed treatment plan for Atrial Fibrillation, CHF, HTN, HLD, COPD, and Idiopathic Pulmonary Fibrosis  as evidenced by readings within limits, voiced adherence to plan of care continue to work with RN Care Manager to address care management and care coordination needs related to  Atrial Fibrillation, CHF, HTN, HLD, COPD, and Idiopathic Pulmonary Fibrosis  as evidenced by adherence to CM Team Scheduled appointments through collaboration with RN Care manager, provider, and care team.   Interventions: 1:1 collaboration with primary care provider regarding development and update of comprehensive plan of care as evidenced by provider attestation and co-signature Inter-disciplinary care team collaboration (see longitudinal plan of care) Evaluation of current treatment plan related to  self management and patient's adherence to plan as established by provider   AFIB Interventions: (Status:  Goal Met.) Long Term Goal   Counseled on increased risk of stroke due to Afib and benefits of anticoagulation for stroke  prevention Reviewed importance of adherence to anticoagulant exactly as  prescribed Counseled on bleeding risk associated with Xarelto  and importance of self-monitoring for signs/symptoms of bleeding Afib action plan reviewed   Heart Failure Interventions:  (Status:  Goal Met.) Long Term Goal Basic overview and discussion of pathophysiology of Heart Failure reviewed Provided education on low sodium diet Reviewed Heart Failure Action Plan in depth and provided written copy Assessed need for readable accurate scales in home Provided education about placing scale on hard, flat surface Advised patient to weigh each morning after emptying bladder Discussed importance of daily weight and advised patient to weigh and record daily Discussed the importance of keeping all appointments with provider Screening for signs and symptoms of depression related to chronic disease state  Assessed social determinant of health barriers   COPD Interventions:  (Status:  Goal Met.) Long Term Goal Provided patient with basic written and verbal COPD education on self care/management/and exacerbation prevention Advised patient to track and manage COPD triggers Provided instruction about proper use of medications used for management of COPD including inhalers Advised patient to self assesses COPD action plan zone and make appointment with provider if in the yellow zone for 48 hours without improvement Provided education about and advised patient to utilize infection prevention strategies to reduce risk of respiratory infection Discussed the importance of adequate rest and management of fatigue with COPD   Idiopathic Pulmonary Fibrosis   (Status:  Goal Met.)  Long Term Goal Evaluation of current treatment plan related to  Idiopathic Pulmonary Fibrosis  ,  Idiopathic Pulmonary Fibrosis   self-management and patient's adherence to plan as established by provider. Discussed plans with patient for ongoing care management  follow up and provided patient with direct contact information for care management team Evaluation of current treatment plan related to Idiopathic Pulmonary Fibrosis  and patient's adherence to plan as established by provider Provided education to patient re: Idiopathic Pulmonary Fibrosis  Discussed plans with patient for ongoing care management follow up and provided patient with direct contact information for care management team  Hyperlipidemia Interventions:  (Status:  New goal.) Long Term Goal Medication review performed; medication list updated in electronic medical record.  Provider established cholesterol goals reviewed Counseled on importance of regular laboratory monitoring as prescribed Reviewed role and benefits of statin for ASCVD risk reduction Reviewed importance of limiting foods high in cholesterol  Hypertension Interventions:  (Status:  Goal Met.) Long Term Goal Last practice recorded BP readings:  BP Readings from Last 3 Encounters:  04/06/21 98/60  03/09/21 128/68  02/28/21 108/68  Most recent eGFR/CrCl:  Lab Results  Component Value Date   EGFR 73 05/08/2020    No components found for: CRCL  Evaluation of current treatment plan related to hypertension self management and patient's adherence to plan as established by provider Discussed plans with patient for ongoing care management follow up and provided patient with direct contact information for care management team Advised patient, providing education and rationale, to monitor blood pressure daily and record, calling PCP for findings outside established parameters  Patient Goals/Self-Care Activities: Take all medications as prescribed Attend all scheduled provider appointments Call pharmacy for medication refills 3-7 days in advance of running out of medications Perform all self care activities independently  Call provider office for new concerns or questions  call office if I gain more than 2 pounds in one  day or 5 pounds in one week do ankle pumps when sitting track weight in diary watch for swelling in feet, ankles and legs every day weigh myself  daily eat more whole grains, fruits and vegetables, lean meats and healthy fats identify and remove indoor air pollutants limit outdoor activity during cold weather listen for public air quality announcements every day attend pulmonary rehabilitation follow rescue plan if symptoms flare-up get at least 7 to 8 hours of sleep at night begin a symptom diary check pulse (heart) rate once a day take medicine as prescribed check blood pressure daily write blood pressure results in a log or diary call doctor for signs and symptoms of high blood pressure limit salt intake to 2358m/day call for medicine refill 2 or 3 days before it runs out take all medications exactly as prescribed call doctor with any symptoms you believe are related to your medicine  Follow Up Plan:  The patient has been provided with contact information for the care management team and has been advised to call with any health related questions or concerns.  No further follow up required: Case closed goals met        The patient verbalized understanding of instructions, educational materials, and care plan provided today and DECLINED offer to receive copy of patient instructions, educational materials, and care plan.  Telephone follow up appointment with pharmacy team member scheduled for: 149 months  Can Lucci L Helyn Schwan RMinooka PharmD Clinical Pharmacist  [facility]([phone]

## 2021-09-05 NOTE — Progress Notes (Signed)
Chronic Care Management Pharmacy Note  Summary: PharmD FU.  BP controlled at home.  He denies any swelling or increased SOB.  Reports some weight loss.  Overall doing well managing chronic conditions.  Recommendations: No changes at this time.  FU: 12 months PharmD   09/05/2021 Name:  [name] MRN:  [id number] DOB:  1940-12-20 Subjective: [name] is an 81 y.o. year old male who is a primary patient of Tabori, [name], MD.  The CCM team was consulted for assistance with disease management and care coordination needs.    Engaged with patient by telephone for follow up visit in response to provider referral for pharmacy case management and/or care coordination services.   Consent to Services:  The patient was given information about Chronic Care Management services, agreed to services, and gave verbal consent prior to initiation of services.  Please see initial visit note for detailed documentation.   Patient Care Team: [name], MD as PCP - General (Family Medicine) Festus Aloe, MD as Consulting Physician (Urology) Michael Boston, MD as Consulting Physician (General Surgery) [name], MD as Consulting Physician (Cardiology) Jodi Marble, MD as Consulting Physician (Otolaryngology) Deboraha Sprang, MD as Consulting Physician (Cardiology) Luberta Mutter, MD as Consulting Physician (Ophthalmology) Lamonte Sakai Rose Fillers, MD as Consulting Physician (Pulmonary Disease) Mauri Pole, MD as Consulting Physician (Gastroenterology) [name], [facility] (Pharmacist)  Recent office visits:  11/16/20 [name], MD (PCP) - Family Medicine - Hypertension - Labs were ordered. Referrlal to  Pulmonology placed. IF you have worsening dizziness or falls- decrease your Coreg to 1/2 tab twice daily. Follow up in 6 months.   Recent consult visits:  12/06/20 [name], NP - IPF - Pulmonology - levalbuterol [facility] HFA) 45 MCG/ACT inhaler Inhale  2 puffs into the lungs every 4 (four) hours as needed for wheezing prescribed. Labs ordered. Imodium AD  if needed for diarrhea . Labs in 4 weeks. Follow up in 6 weeks.    11/23/20 [name], NP - IPF - Pulmonology (Video Visit) - No medication changes. Follow up in 2 weeks for labs.   11/08/20 Brand Males, MD - Pulmonololgy - IPF - Referral to Pulmonary rehab. Start esbiret per protocol 11/14/20. Cotninue breztri daily and albueo as needed. Follow up in 2 weeks.   Objective:  Lab Results  Component Value Date   CREATININE 1.01 05/21/2021   CREATININE 0.94 04/06/2021   CREATININE 0.99 03/02/2021    Lab Results  Component Value Date   HGBA1C 6.0 11/17/2020   Last diabetic Eye exam: No results found for: "HMDIABEYEEXA"  Last diabetic Foot exam: No results found for: "HMDIABFOOTEX"      Component Value Date/Time   CHOL 140 05/21/2021 1534   TRIG 101.0 05/21/2021 1534   HDL 39.20 05/21/2021 1534   CHOLHDL 4 05/21/2021 1534   VLDL 20.2 05/21/2021 1534   LDLCALC 80 05/21/2021 1534   LDLCALC 169 (H) 06/11/2017 1637   LDLDIRECT 168.0 07/12/2015 0928       Latest Ref Rng & Units 08/03/2021    4:26 PM 05/21/2021    3:34 PM 04/06/2021    3:44 PM  Hepatic Function  Total Protein 6.1 - 8.1 g/dL 6.0  6.4  6.6   Albumin 3.5 - 5.2 g/dL  3.9    AST 10 - 35 U/L _0 ALT 9 - 46 U/L _1 Alk Phosphatase 39 - 117  U/L  57    Total Bilirubin 0.2 - 1.2 mg/dL 0.5  0.6  0.7   Bilirubin, Direct 0.0 - 0.2 mg/dL 0.1  0.1      Lab Results  Component Value Date/Time   TSH 0.88 05/21/2021 03:34 PM   TSH 1.67 11/16/2020 10:34 AM       Latest Ref Rng & Units 05/21/2021    3:34 PM 01/19/2021    2:53 PM 11/16/2020   10:34 AM  CBC  WBC 4.0 - 10.5 K/uL 7.7  7.5  7.3   Hemoglobin 13.0 - 17.0 g/dL 15.7  14.8  15.1   Hematocrit 39.0 - 52.0 % 46.4  45.1  44.7   Platelets 150.0 - 400.0 K/uL 286.0  282.0  280.0     Lab Results  Component Value Date/Time   VD25OH 38.99  11/16/2020 10:34 AM   VD25OH 37.42 11/17/2019 11:11 AM   Clinical ASCVD: Yes  The ASCVD Risk score (Arnett DK, et al., 2019) failed to calculate for the following reasons:   The valid systolic blood pressure range is 90 to 200 mmHg    Other: (CHADS2VASc if Afib, PHQ9 if depression, MMRC or CAT for COPD, ACT, DEXA)  Social History   Tobacco Use  Smoking Status Former   Packs/day: 2.00   Years: 15.00   Total pack years: 30.00   Types: Cigarettes   Quit date: 01/15/1980   Years since quitting: 41.6  Smokeless Tobacco Never   BP Readings from Last 3 Encounters:  08/03/21 (!) 80/50  06/20/21 114/60  06/20/21 90/60   Pulse Readings from Last 3 Encounters:  08/03/21 60  06/20/21 89  06/20/21 60   Wt Readings from Last 3 Encounters:  08/03/21 190 lb (86.2 kg)  06/20/21 190 lb 2 oz (86.2 kg)  06/20/21 190 lb (86.2 kg)    Assessment: Review of patient past medical history, allergies, medications, health status, including review of consultants reports, laboratory and other test data, was performed as part of comprehensive evaluation and provision of chronic care management services.   SDOH:  (Social Determinants of Health) assessments and interventions performed: No, done within the last year  Financial Resource Strain: Low Risk  (04/25/2021)   Overall Financial Resource Strain (CARDIA)    Difficulty of Paying Living Expenses: Not hard at all    Jud  Allergies  Allergen Reactions   Sulfa Antibiotics Rash    Medications Reviewed Today     Reviewed by [name], [facility] (Pharmacist) on 09/05/21 at [place] List Status: <None>   Medication Order Taking? Sig Documenting Provider Last Dose Status Informant  antiseptic oral rinse (BIOTENE) LIQD [id number] Yes 15 mLs by Mouth Rinse route in the morning and at bedtime. [provider] Taking Active Self  Budeson-Glycopyrrol-Formoterol (BREZTRI AEROSPHERE) 160-9-4.8 MCG/ACT AERO [id number] Yes Inhale 2 puffs  into the lungs in the morning and at bedtime. Parrett, Fonnie Mu, NP Taking Active   carvedilol (COREG) 3.125 MG tablet [id number] Yes Take 1 tablet (3.125 mg total) by mouth 2 (two) times daily with a meal. [name], MD Taking Active   cholecalciferol (VITAMIN D) 1000 units tablet [id number] Yes Take 1,000 Units by mouth every evening. [provider] Taking Active Self  clotrimazole (MYCELEX) 10 MG troche [id number] Yes Dissolve, swish and swallow one tablet by mouth 5 times daily for 14 days. Brand Males, MD Taking Active   empagliflozin (JARDIANCE) 10 MG TABS tablet [id number] Yes Take 1 tablet (10 mg  total) by mouth daily before breakfast. [name], MD Taking Active   eplerenone (INSPRA) 25 MG tablet [id number] Yes TAKE 1 TABLET(25 MG) BY MOUTH DAILY Stanford Breed, Denice Bors, MD Taking Active   furosemide (LASIX) 40 MG tablet [id number] Yes Take 2 tablets (80 mg total) by mouth daily. [name], MD Taking Active   Ginger, Zingiber officinalis, (GINGER ROOT) 550 MG CAPS [id number] Yes Take 1 capsule by mouth daily. [provider] Taking Active   ipratropium (ATROVENT) 0.06 % nasal spray [id number] Yes Place 2 sprays into both nostrils 3 (three) times daily. Collene Gobble, MD Taking Active   levalbuterol [facility] HFA) 45 MCG/ACT inhaler [id number] Yes Inhale 2 puffs into the lungs every 4 (four) hours as needed for wheezing. Parrett, Fonnie Mu, NP Taking Active   loratadine (CLARITIN) 10 MG tablet [id number] Yes Take 10 mg by mouth in the morning. [provider] Taking Active Self  Magnesium 250 MG TABS [id number] Yes Take 250 mg by mouth every evening. [provider] Taking Active Self  mexiletine (MEXITIL) 150 MG capsule [id number] Yes TAKE 2 CAPSULES(300 MG) BY MOUTH TWICE DAILY Crenshaw, Denice Bors, MD Taking Active   Multiple Vitamin (MULTIVITAMIN WITH MINERALS) TABS [id number] Yes Take 1 tablet by mouth every evening. [provider] Taking  Active Self  nitroGLYCERIN (NITROSTAT) 0.4 MG SL tablet [id number] Yes DISSOLVE 1 TABLET UNDER THE TONGUE EVERY 5 MINUTES AS NEEDED FOR CHEST PAIN [name], MD Taking Active   Olopatadine HCl (PATADAY OP) [id number] Yes Apply 1 drop to eye daily. [provider] Taking Active   Omeprazole 20 MG TBEC [id number] Yes Take 1 tablet (20 mg total) by mouth daily. [name], MD Taking Active   Pirfenidone (ESBRIET) 267 MG TABS [id number] Yes Take 2 tablets (534 mg total) by mouth with breakfast, with lunch, and with evening meal. Brand Males, MD Taking Active   polyvinyl alcohol-povidone (HYPOTEARS) 1.4-0.6 % ophthalmic solution [id number] Yes Place 1-2 drops into both eyes in the morning and at bedtime. [provider] Taking Active Self  potassium chloride SA (KLOR-CON M) 20 MEQ tablet [id number] Yes Take 1 tablet (20 mEq total) by mouth daily with lunch. [name], MD Taking Active   rivaroxaban (XARELTO) 20 MG TABS tablet [id number] Yes TAKE 1 TABLET(20 MG) BY MOUTH DAILY WITH SUPPER [name], MD Taking Active   rosuvastatin (CRESTOR) 40 MG tablet [id number] Yes TAKE 1 TABLET(40 MG) BY MOUTH EVERY EVENING Crenshaw, Denice Bors, MD Taking Active   sacubitril-valsartan (ENTRESTO) 24-26 MG [id number] Yes Take 1 tablet by mouth 2 (two) times daily. [name], MD Taking Active   Simethicone (GAS-X PO) [id number] Yes Take by mouth as directed. [provider] Taking Active             Patient Active Problem List   Diagnosis Date Noted   Physical deconditioning 04/06/2021   IPF (idiopathic pulmonary fibrosis) ([place]) 11/16/2020   Encounter for follow-up surveillance of colon cancer    Benign neoplasm of descending colon    Pulmonary nodules 05/18/2015   Rectal bleeding 03/29/2015   Internal hemorrhoid, bleeding 03/29/2015   COPD (chronic obstructive pulmonary disease) ([place]) 03/28/2015   Gastroesophageal reflux disease 03/17/2015   Prostate  cancer screening 03/09/2014   Thoracic aortic aneurysm ([place]) 09/28/2013   Encounter for therapeutic drug monitoring 03/03/2013   Constipation, chronic 75/05/1831   Umbilical hernia with intermittent pain 10/21/2012   Obesity (BMI 30-39.9) 10/21/2012   TIA (transient  ischemic attack) 09/25/2012   Other malaise and fatigue 08/25/2012   Vitamin D deficiency 07/28/2012   CHF (congestive heart failure) ([place]) 07/28/2012   Chronotropic incompetence 10/25/2011   History of non anemic vitamin B12 deficiency 05/29/2011   Mild cognitive impairment 01/13/2011   Ringing in ears 01/13/2011   CAD (coronary artery disease) 09/14/2010   Elevated transaminase level--on amiodarone 06/19/2010   Biventricular implantable cardioverter-defibrillator in situ 05/16/2010   Long term (current) use of anticoagulants 04/11/2010   Microscopic hematuria 04/04/2010   CLAUDICATION 11/28/2009   Atrial fibrillation ([place]) 06/29/2009   Chronic combined systolic and diastolic heart failure ([place]) 05/26/2009   Ischemic cardiomyopathy 07/07/2008   VENTRICULAR TACHYCARDIA 06/29/2008   Hyperlipidemia 01/04/2008   Essential hypertension 01/04/2008   Chronic rhinitis 03/04/2007   Obstructive sleep apnea 03/02/2007    Immunization History  Administered Date(s) Administered   Fluad Quad(high Dose 65+) 09/23/2018, 10/05/2020   Influenza Split 11/11/2010, 11/02/2012, 10/14/2013   Influenza Whole 09/27/2011, 11/13/2011   Influenza, High Dose Seasonal PF 09/26/2015, 10/07/2016   Influenza, Seasonal, Injecte, Preservative Fre 09/23/2018   Influenza,inj,Quad PF,6+ Mos 11/03/2016, 09/20/2017   Influenza-Unspecified 09/26/2014, 10/15/2019   PFIZER Comirnaty(Gray Top)Covid-19 Tri-Sucrose Vaccine 04/24/2020   PFIZER(Purple Top)SARS-COV-2 Vaccination 02/02/2019, 02/22/2019, 10/26/2019, 09/29/2020   Pneumococcal Conjugate-13 06/15/2014   Pneumococcal Polysaccharide-23 03/15/2002, 07/10/2015   Td 08/27/2017   Tdap 04/04/2010    Zoster Recombinat (Shingrix) 03/29/2016   Zoster, Live 05/15/2015   Conditions to be addressed/monitored: Atrial Fibrillation, CHF, HTN, HLD, and Hypertriglyceridemia  Patient Care Plan: General Pharmacy (Adult)     Problem Identified: Atrial Fibrillation, CHF, HTN, HLD, and Hypertriglyceridemia   Priority: High  Onset Date: 02/15/2021     Long-Range Goal: Patient-Specific Goal   Start Date: 02/15/2021  Expected End Date: 08/15/2021  Recent Progress: On track  Priority: High  Note:   Current Barriers:  High cost medications  Pharmacist Clinical Goal(s):  Patient will verbalize ability to afford treatment regimen achieve improvement in fatigue as evidenced by symptoms through collaboration with PharmD and provider.   Interventions: 1:1 collaboration with [name], MD regarding development and update of comprehensive plan of care as evidenced by provider attestation and co-signature Inter-disciplinary care team collaboration (see longitudinal plan of care) Comprehensive medication review performed; medication list updated in electronic medical record  Hypertension, CHF (BP goal <130/80) 09/05/21 -Controlled -Current treatment: Coreg 3.149m mg BID Appropriate, Effective, Safe, Accessible Entresto 24-26 mg BID Appropriate, Effective, Safe, Accessible Furosemide 40 mg 2 tabs daily Appropriate, Effective, Safe, Accessible Jardiance 139mAppropriate, Effective, Safe, Accessible -Current home readings: 137/80, 114/81, 124/74, 118/86, 126/77, 95/61, 108/60  Pulse - averages 68-69 -Current dietary habits: low salt -Current exercise habits: no formal exercise.  -Denies hypotensive/hypertensive symptoms -Educated on BP goals and benefits of medications for prevention of heart attack, stroke and kidney damage; -Recommended to continue current medication Assessed patient finances. He is having no concerns with medication prices at this time.  BP at home seems controlled.  He  denies any dizziness or HA.  No swelling in extremities.  No changes needed at this time.  Hyperlipidemia: (LDL goal < 70) -Not ideally controlled, not assessed -Current treatment: Rosuvastatin 40 mg once daily Appropriate, Effective, Safe, Accessible -Medications previously tried: previously noted rash with crestor, tolerating well. No side effects noted  -Educated on Cholesterol goals;  -Recommended to continue current medication  Update 02/15/21 Most recent LDL well controlled. Denies any more issues with Crestor. Adherence 100% - continue current meds. Recheck lipids at next  routine check up.  Atrial Fibrillation (Goal: prevent stroke and major bleeding) 09/05/21 -Controlled -CHADSVASC: 7 (>10% annual stroke risk) -Current treatment: Rate control: Coreg 3.125m Appropriate, Effective, Safe, Accessible Anticoagulation: Xarelto 273mAppropriate, Effective, Safe, Accessible -Medications previously tried: amiodarone -Home BP and HR readings: none provided  -Counseled on increased risk of stroke due to Afib and benefits of anticoagulation for stroke prevention; importance of adherence to anticoagulant exactly as prescribed; -Assessed finances, no issues with medication cost at this time. HR seems controlled - no changes needed.  COPD,  (Goal: control symptoms and prevent exacerbations) -Not ideally controlled, managed by pulm not assessed New dgx of ILD, set to start Esbriet through pulm. Pharmacist there helped patient get approved for PAP.  -Current treatment  Xopenex prn  Appropriate, Effective, Safe, Accessible Breztri 160-9-4.75m4mAppropriate, Effective, Safe, Accessible Esbriet 267m87mo tablet three times per day  Appropriate, Effective, Safe, Accessible -Medications previously tried: symbicort, albuterol   -Gold Grade: Gold 2 (FEV1 50-79%) -Current COPD Classification:  B (high sx, <2 exacerbations/yr) -Pulmonary function testing: 09/2020  -Exacerbations requiring  treatment in last 6 months: 0 -Patient reports consistent use of maintenance inhaler -Frequency of rescue inhaler use: ~2x/wk -Counseled on Proper inhaler technique; Benefits of consistent maintenance inhaler use When to use rescue inhaler Differences between maintenance and rescue inhalers -Recommended to continue current medication Assessed patient finances. Unlikely to qualify for assistance on Breztri  Update 02/14/21 Still using inhalers appropriately. Was taken off Esbriet temporarily due to LFTs and fatigue.  He has started it back taking one tablet three times daily with plans to work back to twice daily. Breztri and other meds currently affordable. Continue to monitor LFTs. Followed by pulm - continue routine check ups wit them on lung function.   Follow Up Plan: The care management team will reach out to the patient again over the next 365 days.         Compliance/Adherence/Medication fill history: Care Gaps: N/A  Star-Rating Drugs: Rosuvastatin 40mg275m4/23 90ds Entresto 24-26mg 49m23 90ds  Medication Assistance: None required.  Patient affirms current coverage meets needs.  [id number] [id number] [place] [phone]: [phone] Up:  Patient agrees to Care Plan and Follow-up.  Plan:  6 month BP call, 12 month CPP f/u call   Future Appointments  Date Time Provider [place]art[place]/2023  3:00 PM DWB-CT 1 DWB-CT DWB  11/08/2021 11:30 AM RamaswBrand MalesBPU-PULCARE None  11/16/2021  7:00 AM [facility] DEVICE REMOTES [facility]  11/22/2021  2:20 PM TaboriMidge MiniumBPC-SV PEC  12/19/2021 10:40 AM CrenshStanford Breed Denice BorsVD-HIGHPT None  02/15/2022  7:00 AM [facility] DEVICE REMOTES [facility]   ChristBeverly MilchmD Clinical Pharmacist   [facility] [phone]

## 2021-09-06 NOTE — Progress Notes (Signed)
Remote ICD transmission.   

## 2021-09-13 DIAGNOSIS — I509 Heart failure, unspecified: Secondary | ICD-10-CM

## 2021-09-13 DIAGNOSIS — I4891 Unspecified atrial fibrillation: Secondary | ICD-10-CM

## 2021-09-13 DIAGNOSIS — J449 Chronic obstructive pulmonary disease, unspecified: Secondary | ICD-10-CM

## 2021-09-13 DIAGNOSIS — E785 Hyperlipidemia, unspecified: Secondary | ICD-10-CM

## 2021-09-13 DIAGNOSIS — I110 Hypertensive heart disease with heart failure: Secondary | ICD-10-CM

## 2021-10-03 ENCOUNTER — Ambulatory Visit (HOSPITAL_BASED_OUTPATIENT_CLINIC_OR_DEPARTMENT_OTHER)
Admission: RE | Admit: 2021-10-03 | Discharge: 2021-10-03 | Disposition: A | Discharge status: Home / Self Care | Payer: Medicare Other | Source: Ambulatory Visit | Attending: Internal Medicine | Admitting: Internal Medicine

## 2021-10-03 DIAGNOSIS — J84112 Idiopathic pulmonary fibrosis: Secondary | ICD-10-CM | POA: Insufficient documentation

## 2021-10-03 DIAGNOSIS — R911 Solitary pulmonary nodule: Secondary | ICD-10-CM | POA: Diagnosis not present

## 2021-10-03 DIAGNOSIS — J439 Emphysema, unspecified: Secondary | ICD-10-CM | POA: Diagnosis not present

## 2021-10-24 ENCOUNTER — Telehealth: Payer: Self-pay

## 2021-10-24 ENCOUNTER — Other Ambulatory Visit (HOSPITAL_COMMUNITY): Payer: Self-pay

## 2021-10-24 NOTE — Telephone Encounter (Signed)
Received fax from [facility] indicating that they may have been unable to contact pt regarding renewal information and request we fill out information on pt's behalf. Previous financial status obtained and documented, copy of insurance card has been attached and faxed back to [place].   Nothing further should be required at this time.

## 2021-10-26 ENCOUNTER — Telehealth: Payer: Self-pay | Admitting: Family Medicine

## 2021-10-26 NOTE — Telephone Encounter (Signed)
Left message for patient to call back and schedule Medicare Annual Wellness Visit (AWV).   Please offer to do virtually or by telephone.  Left office number and my jabber [phone].  Last AWV:11/06/2020  Please schedule at anytime with Nurse Health Advisor.

## 2021-10-28 ENCOUNTER — Other Ambulatory Visit: Payer: Self-pay | Admitting: Adult Health

## 2021-11-08 ENCOUNTER — Encounter: Payer: Self-pay | Admitting: Internal Medicine

## 2021-11-08 ENCOUNTER — Ambulatory Visit (INDEPENDENT_AMBULATORY_CARE_PROVIDER_SITE_OTHER): Payer: Medicare Other | Admitting: Internal Medicine

## 2021-11-08 VITALS — BP 102/68 | HR 57 | Temp 97.7°F | Ht 70.0 in | Wt 185.4 lb

## 2021-11-08 DIAGNOSIS — Z23 Encounter for immunization: Secondary | ICD-10-CM

## 2021-11-08 DIAGNOSIS — I255 Ischemic cardiomyopathy: Secondary | ICD-10-CM | POA: Diagnosis not present

## 2021-11-08 DIAGNOSIS — J84112 Idiopathic pulmonary fibrosis: Secondary | ICD-10-CM

## 2021-11-08 DIAGNOSIS — R634 Abnormal weight loss: Secondary | ICD-10-CM | POA: Diagnosis not present

## 2021-11-08 DIAGNOSIS — Z5181 Encounter for therapeutic drug level monitoring: Secondary | ICD-10-CM | POA: Diagnosis not present

## 2021-11-08 DIAGNOSIS — J439 Emphysema, unspecified: Secondary | ICD-10-CM

## 2021-11-08 DIAGNOSIS — T50905A Adverse effect of unspecified drugs, medicaments and biological substances, initial encounter: Secondary | ICD-10-CM

## 2021-11-08 DIAGNOSIS — J8410 Pulmonary fibrosis, unspecified: Secondary | ICD-10-CM

## 2021-11-08 MED ORDER — CLOTRIMAZOLE 10 MG MT TROC: OROMUCOSAL | 1 refills | Status: DC

## 2021-11-08 NOTE — Patient Instructions (Addendum)
Pulmnary Fibrosis with emphysema ILD (interstitial lung disease) ([place]) IPF (idiopathic pulmonary fibrosis) ([facility])  - IPF is  stable x 1 year on CT on  low dose ebreit trough progressed in 3 years - -Currently on Esbriet 2 pills 3 times daily   -  being capped because baseline mexilitine can increase esbriet levels  - esbiret contributing to fatigue   Plan - high dose flu sho 11/08/2021 - check LFT 11/08/2021 - continue esbriet 2 pills x 3 times daily  - controlling GI symptoms with omeprazole, miralax, ginger regimen as needed - Continue pulmonary rehab  - appreciate clinical trial interst    - take information on pill study by [employer] and injection sttydy by Celso Amy - spiro/dlco in 3-4 months  Chronic obstructive pulmonary disease, unspecified COPD type ([place])  - not in flare up  Plan  - cotninue breztri daily - albueo as needed - refill clotrimazole for thrush as needed  GERD - appears omeprazole helping you bu t you are worried about kidney side effect  Plan  - stop omeprazole  Weight loss- drug induced Low appetite   -ongoing weight loss  due to esbriet and jardiance  but seems stabilized - currently 180#  Plan  -monitor; ok to tolerate weight loss till 170- 175#   Followup - 3-4 months in 30 min visit after PFT; but can cancel if on ressearch

## 2021-11-08 NOTE — Progress Notes (Signed)
ROV 02/19/18 --81 year old man who follows up today for mixed obstruction and restriction, COPD as well as stable bilateral lower lobe interstitial disease.  We questioned possible amiodarone toxicity and this was stopped.  He had a repeat high-resolution CT scan of the chest on 02/17/2018 that I reviewed.  This shows some mild emphysematous change irregular peripheral interstitial disease with some mild associated bronchiectasis, no significant honeycomb change.  Unchanged compared with his prior 1 year ago.  Pulmonary function testing done on 02/09/2018 shows mild mixed restriction and obstruction, overall stable compared with his prior 1 year ago.        ROV 04/23/19 --[name] is 15 and has a history of mixed obstruction and restriction on pulmonary function testing, COPD as well as stable bilateral lower lobe interstitial disease.  His last CT scan was 02/17/2018 (stable).  He was previously on amiodarone, this has been stopped for several years.  Currently managed on Symbicort, is using albuterol very rarely He reports that he is having some imbalance, feels unstable w ambulation, has had some falls.  He is having more cough, mucous production over the last several months. He has a lot of nasal gtt - uses Atrovent NS with improvement. He is on flonase daily, loratadine.   Chest x-ray from 04/15/2019 reviewed by me, shows persistent interstitial markings particularly in the right midlung. No significant change or increase noted when comparing with prior films.   ROV 09/27/20 --follow-up visit 81 year old gentleman whom we have followed for mixed obstruction and restriction due to COPD and also bilateral lower lobe interstitial disease question amiodarone toxicity.  Autoimmune panel in October 2017 was negative.  He is now in atrial fibrillation almost all the time, notices that his dyspnea is associated with tachycardia.  Currently managed on Symbicort, albuterol which he is using 2-3x a month.  He  is having a lot of cough and nasal congestion / drainage. He wonders if the flonase is part of the problem, was told to do atrovent up to tid to see if he gets benefit.   Most recent CT chest performed 09/22/2020 reviewed by me, shows persistent patchy groundglass attenuation and septal thickening in the subpleural regions without any definite honeycomb, possibly slightly progressed compared with 02/17/2018.  Pulmonary function testing performed today and reviewed by me, showed mixed restriction and obstruction with an FEV1 of 1.9 865% predicted and no bronchodilator response.  Significant decrease in FEV1 compared with 02/09/2018.  Restricted lung volumes, decreased diffusion capacity that does correct when adjusted for his alveolar volume.  OV 10/05/2020 -transfer of care to the ILD center with Dr. Chase Caller.  Patient being referred by Dr. Baltazar Apo  Subjective:  Patient ID: [name], male , DOB: 1940-11-06 , age 51 y.o. , MRN: [id number] , ADDRESS: [place] [address] PCP [name], MD Patient Care Team: [name], MD as PCP - General (Family Medicine) Festus Aloe, MD as Consulting Physician (Urology) Michael Boston, MD as Consulting Physician (General Surgery) Stanford Breed, Denice Bors, MD as Consulting Physician (Cardiology) Jodi Marble, MD as Consulting Physician (Otolaryngology) Deboraha Sprang, MD as Consulting Physician (Cardiology) Luberta Mutter, MD as Consulting Physician (Ophthalmology) Lamonte Sakai Rose Fillers, MD as Consulting Physician (Pulmonary Disease) Mauri Pole, MD as Consulting Physician (Gastroenterology) [name], [facility] as Pharmacist (Pharmacist)  This Provider for this visit: Treatment Team:  Attending Provider: Brand Males, MD    10/05/2020 -   Chief Complaint  Patient presents with   Follow-up    Pt states that he does have complaints of SOB that can happen at any time but is worse with exertion.      Joel [name] 81 y.o. -referred by Dr. Baltazar Apo.  History is provided by the patient and also review of the medical records.  Patient tells me he is aware that he has ILD.  He has a significant symptom burden.  He says he is recently been told his ILD is gotten worse.  He was on amiodarone for many years.  Then approximately 18 months ago he stopped it because he got worried about the drug.  Dr. Lamonte Sakai notes indicate that patient has combined emphysema and interstitial lung disease.  His pulmonary function test profile shows 20% decline in FVC in the last 5 years or so with most of it coming in the last 2 years.  High-resolution CT chest only shows some decline.  High-resolution CT chest personally visualized by me and is probable UIP.  His serologies are negative.  He is a former smoker having quit over 40 years ago.  He is Caucasian and his age is greater than 35.  Male gender.  He definitely has progressive symptoms.  He really wants a good symptom control.  He also wants understand his disease.  Today most of the focus was spent on helping him understand interstitial lung disease.  I explicitly told him that I would not be able to address symptom burden today.  He will also switch his comprehensive pulmonary care to see Dr. Chase Caller myself.  He has had an MI in the past.  His atrial fibrillation is on Eliquis.  He is no longer on amiodarone.  His walking desaturation test did not show desaturation more than 3 points.   CT Chest data Sept 2022  Narrative & Impression  CLINICAL DATA:  81 year old male with history of COPD. Evaluate for interstitial lung disease.   EXAM: CT CHEST WITHOUT CONTRAST   TECHNIQUE: Multidetector CT imaging of the chest was performed following the standard protocol without intravenous contrast. High resolution imaging of the lungs, as well as inspiratory and expiratory imaging, was performed.   COMPARISON:  Chest CT 02/17/2018.    FINDINGS: Cardiovascular: Heart size is normal. There is no significant pericardial fluid, thickening or pericardial calcification. There is aortic atherosclerosis, as well as atherosclerosis of the great vessels of the mediastinum and the coronary arteries, including calcified atherosclerotic plaque in the left main, left anterior descending, left circumflex and right coronary arteries. Calcifications of the aortic valve. Curvilinear calcifications associated with the myocardium of the inferior wall of the left ventricle in the mid ventricle and base, likely sequela of prior left circumflex coronary artery territory myocardial infarction(s). Left-sided biventricular pacemaker/AICD with lead tips terminating in the right atrial appendage, right ventricular apex, and overlying the lateral wall the left ventricle via the coronary sinus and coronary veins.   Mediastinum/Nodes: No pathologically enlarged mediastinal or hilar lymph nodes. Please note that accurate exclusion of hilar adenopathy is limited on noncontrast CT scans. Esophagus is unremarkable in appearance. No axillary lymphadenopathy.   Lungs/Pleura: High-resolution images again demonstrate some patchy areas of ground-glass attenuation, septal thickening, subpleural reticulation, mild traction bronchiectasis and peripheral bronchiolectasis in the lungs bilaterally. No definite honeycombing. These findings have a mild craniocaudal gradient. Inspiratory and expiratory imaging is unremarkable. No acute consolidative airspace disease. No pleural effusions. Chronic elevation of the right hemidiaphragm is again noted. No definite suspicious appearing  pulmonary nodules or masses are noted.   Upper Abdomen: Low-attenuation lesions are noted in the left lobe of the liver, incompletely characterized on today's non-contrast CT examination, but similar to the prior study and measuring up to 1.3 cm in diameter in segment 2,  statistically likely to represent cysts.   Musculoskeletal: There are no aggressive appearing lytic or blastic lesions noted in the visualized portions of the skeleton.   IMPRESSION: 1. The appearance of the lungs is compatible with interstitial lung disease, categorized as probable usual interstitial pneumonia (UIP) per current ATS guidelines. Findings appear mildly progressive compared to the prior study. 2. Aortic atherosclerosis, in addition to left main and 3 vessel coronary artery disease. Assessment for potential risk factor modification, dietary therapy or pharmacologic therapy may be warranted, if clinically indicated. 3. There are calcifications of the aortic valve. Echocardiographic correlation for evaluation of potential valvular dysfunction may be warranted if clinically indicated.   Aortic Atherosclerosis (ICD10-I70.0).     Electronically Signed   By: Vinnie Langton M.D.   On: 09/22/2020 11:28   No results found   Results for DENI, LEFEVER (MRN [id number]) as of 10/05/2020 11:51  Ref. Range 11/14/2015 11:52 09/27/2020 12:19 09/27/2020 13:46  Anti Nuclear Antibody (ANA) Latest Ref Range: NEGATIVE  NEG  NEGATIVE  Anti JO-1 Latest Ref Range: 0.0 - 0.9 AI <0.2    Cyclic Citrullin Peptide Ab Latest Units: UNITS <16  <16  ds DNA Ab Latest Units: IU/mL 1    RA Latex Turbid. Latest Ref Range: <14 IU/mL <14 <14 <14  ENA SM Ab Ser-aCnc Latest Ref Range: <1.0 NEG AI  <1.0 NEG    ANA,IFA RA DIAG PNL W/RFLX TIT/PATN Unknown   Rpt  Ribonucleic Protein(ENA) Antibody, IgG Latest Ref Range: <1.0 NEG AI  <1.0 NEG    SSA (Ro) (ENA) Antibody, IgG Latest Ref Range: <1.0 NEG AI <1.0 NEG <1.0 NEG   SSB (La) (ENA) Antibody, IgG Latest Ref Range: <1.0 NEG AI <1.0 NEG <1.0 NEG   Scleroderma (Scl-70) (ENA) Antibody, IgG Latest Ref Range: <1.0 NEG AI  <1.0 NEG      OV 11/08/2020  Subjective:  Patient ID: [name], male , DOB: 14-Apr-1940 , age 36 y.o. , MRN: [id number] , ADDRESS:  [place] [address] PCP [name], MD Patient Care Team: [name], MD as PCP - General (Family Medicine) Festus Aloe, MD as Consulting Physician (Urology) Michael Boston, MD as Consulting Physician (General Surgery) Stanford Breed Denice Bors, MD as Consulting Physician (Cardiology) Jodi Marble, MD as Consulting Physician (Otolaryngology) Deboraha Sprang, MD as Consulting Physician (Cardiology) Luberta Mutter, MD as Consulting Physician (Ophthalmology) Lamonte Sakai Rose Fillers, MD as Consulting Physician (Pulmonary Disease) Mauri Pole, MD as Consulting Physician (Gastroenterology) [name], [facility] as Pharmacist (Pharmacist)  This Provider for this visit: Treatment Team:  Attending Provider: Brand Males, MD    11/08/2020 -   Chief Complaint  Patient presents with   Follow-up    Pt states that he has been worried about taking the Esbriet and states that he has not started taking it yet. States his breathing is about the same.     Joel [name] 81 y.o. - here with wife to discuss disease, Wife is 38 years old. Her name is Myrnal She was a clinical trials coordinator in the late 1990s at [employer] and was involved in oncology and sildenafil trials. Lot of questions on type of fibrosis (IPF), natural course (few  to several  yearS), quality of life expectations, anti fibrotics . Wants symptom relief - referred to rehab. HE is interested in support group - gave him contact number. Risk factor - prior smoking and amio. He has not started esbriet yet. Will do on 11/14/20 . This is becase he and wife went to  reunion. Ate fried food and got diarrhea. Now resolved.   [place] [facility] Comprehensive ILD Questionnaire    Symptoms:  SYMPTOM SCALE - ILD 11/08/2020  Current weight 209#  O2 use ra  Shortness of Breath 0 -> 5 scale with 5 being worst (score 6 If unable to do)  At rest 0  0Simple tasks - showers, clothes change, eating,  shaving 0  Household (dishes, doing bed, laundry) 0  Shopping 3  Walking level at own pace 4  Walking up Stairs 5  Total (30-36) Dyspnea Score 12 x 2 years  same Since onset  How bad is your cough? moderate x  2years. Getting worse. Has light yellow/green sputum. Clears throat  How bad is your fatigue light  How bad is nausea 0  How bad is vomiting?  0  How bad is diarrhea? 0  How bad is anxiety? 0  How bad is depression 0  Any chronic pain - if so where and how bad 0      Past Medical History :  Also copd, cad, chf nos, osa - not using cPAP. Hx of pna > 10 years ago. Hx of renal sones + Has had 5 mRNA covid shots. Last Sept 2022. Never had covid Chronic systolic chf - ef 63-01% in June 2021 (20% in 2017) Patulous esophagus GERD Hx of amio 15--20 years. STppped 2019 Elevated R diapharagm  ROS:  Fatigue + Dry eyes + Gerd +  FAMILY HISTORY of LUNG DISEASE:  denes  PERSONAL EXPOSURE HISTORY:  Smoker [phone]. NOt smoked in 49  years  HOME  EXPOSURE and HOBBY DETAILS :  [place]. [place]. 2 of his tile floors have mildew. Not used jacuzzi in 10 years. 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  Patient has underlying COPD and IPF.  CT chest in September 22, 2020 showed probable UIP.  And mildly progressive compared to previous study.  Patient was recommended to start on Esbriet.  Patient says he has started Esbriet and seems to be tolerating okay.  He has had a couple episodes where he has some loose stools and he took Imodium.  Unfortunately did have some constipation so he also took MiraLAX which caused him to have upset stomach.  We  discussed dietary recommendations and the use of Imodium.  Patient says appetite is good.  No nausea vomiting.  Patient is on Mexiletine , his Esbriet dose will need to remain at 534 mg 3 times a day.  Patient is a been on a tapering dose and has reached his maximum maintenance dose. Patient has shortness of breath with activities.  Remains active.  Gets winded with heavy lifting or prolonged walking.  Has a daily minimally productive cough.   12/06/2020 Follow up ; IPF, COPD  Patient returns for a 2-week follow-up.  Patient has underlying COPD.  Is on Breztri inhaler.  Patient says overall breathing is doing okay.  Feels that it has slightly improved with decreased cough.  Patient also has underlying IPF.  Recently started on Esbriet.  He is on a lower dose due to drug interactions.  Currently on 534 mg 3 times a day.  Last visit patient was having some increased stomach issues.  We discussed some dietary changes.  Patient is now eating less fried foods and higher protein.  Patient says this is made a huge difference and stomach is much better. Hepatic function panel on November 16, 2020 showed normal LFTs.   TEST/EVENTS :  09/22/2020 High-resolution CT chest compatible with interstitial lung disease probable UIP, mildly progressive compared to previous stud      OV 12/22/2020  Subjective:  Patient ID: [name], male , DOB: Feb 26, 1940 , age 33 y.o. , MRN: [id number] , ADDRESS: [place] [address] PCP [name], MD Patient Care Team: [name], MD as PCP - General (Family Medicine) Festus Aloe, MD as Consulting Physician (Urology) Michael Boston, MD as Consulting Physician (General Surgery) Stanford Breed, Denice Bors, MD as Consulting Physician (Cardiology) Jodi Marble, MD as Consulting Physician (Otolaryngology) Deboraha Sprang, MD as Consulting Physician (Cardiology) Luberta Mutter, MD as Consulting Physician (Ophthalmology) Lamonte Sakai Rose Fillers, MD as Consulting Physician (Pulmonary Disease) Mauri Pole, MD as Consulting Physician (Gastroenterology) [name], [facility] as Pharmacist (Pharmacist)  This Provider for this visit: Treatment Team:  Attending Provider: Brand Males, MD   IPF dx given Sept 2022 -> started esbriet  12/22/2020 -   Chief Complaint  Patient presents with   Follow-up     Joel [name] 81 y.o. presents with his wife.-since giving him the diagnosis of IPF and recommending pirfenidone over nintedanib [he has heart disease] he see nurse practitioner 2x1 for a face-to-face visit not a video visit.  He is on 2 pills 3 times a day of pirfenidone.  This is because he is on mexiletine [antiarrhythmic that can also cause abdominal pain, headache and nausea] because of this and CYPa12 mechanism that could inhibit pirfenidone metabolism resulting in elevated levels of pirfenidone he has been kept on 2 pills 3 times daily.  He is upset that this was not picked up by the pulmonary pharmacist but only by the cardiology pharmacist.  He reports because of this he is lost a little bit of confidence in [place].  Overall though he says that he has chronic alternating diarrhea with constipation.  He takes Imodium for the diarrhea and then this resolves and constipation.  Then he has to take MiraLAX.  This is at his baseline despite being on Esbriet it is not worse.  He says he is just frustrated by this alternating constipation and diarrhea.  Between diarrhea and constipation he does not favor constipation.  The only side effect he is noticing from [place] is that he is more fatigued.  Otherwise he is overall stable as seen by the symptom score below other than fatigue.  He is also worried because he is on submaximal doses of pirfenidone that his disease could progress.  I did explain to him that even at 2 pills 3 times daily it is beneficial and with inhibitor effects of mexiletine levels are slightly higher and could  potentially reflect similar to being on 3 pills 3 times daily.  Did explain to him that he might not be able to tolerate  3 pills 3 times daily.  Given his cardiac issues nintedanib currently as second line.  We had a shared understanding and agreement on this approach.  No results found.       OV 01/19/2021  Subjective:  Patient ID: [name], male , DOB: 12/16/1940 , age 17 y.o. , MRN: [id number] , ADDRESS: [place] [address] PCP [name], MD Patient Care Team: [name], MD as PCP - General (Family Medicine) Festus Aloe, MD as Consulting Physician (Urology) Michael Boston, MD as Consulting Physician (General Surgery) Stanford Breed Denice Bors, MD as Consulting Physician (Cardiology) Jodi Marble, MD as Consulting Physician (Otolaryngology) Deboraha Sprang, MD as Consulting Physician (Cardiology) Luberta Mutter, MD as Consulting Physician (Ophthalmology) Lamonte Sakai Rose Fillers, MD as Consulting Physician (Pulmonary Disease) Mauri Pole, MD as Consulting Physician (Gastroenterology) [name], [facility] as Pharmacist (Pharmacist)  This Provider for this visit: Treatment Team:  Attending Provider: Brand Males, MD    01/19/2021 -   Chief Complaint  Patient presents with   Follow-up    PFT performed today.  Pt states that he noticed with last bloodwork that his liver enzymes were elevated and also states that he has noticed some yellowing of skin.  IPF dx given Sept 2022 -> started esbriet   Joel [name] 81 y.o. -returns for follow-up.  This visit is to see his pulmonary function test and his uptake with pirfenidone.  His pulmonary function test shows stability/slight improvement from September 2022 and similar to his previous baseline.  In terms of pirfenidone intake he continues to have alternating diarrhea and constipation.  In talking to him in more detail he says before his Esbriet he used to take MiraLAX once every  other day and have a bowel movement [hard stool] every day once.  After this starting as.  He is now taking his MiraLAX only 1/week.  1 day he has diarrhea in 4 days he is got constipation without any bowel movement.  Therefore he is not taking his MiraLAX for his previous schedule which we discussed at the last visit.  In addition he is lost 1 pound of weight.  He is also reporting intermittent gaseous symptoms bloating acid reflux sour taste.  He takes Tums for this.  He is not taking anything for acid reflux such as PPI or H2 blockade.  He is just taking Tums.  We did discuss the idea of taking ginger capsules but he drinks ginger ale 1 bottle every day.  Did indicate to him even in previous visit that sugary drinks can cause diarrhea and potentially a carbonated drink can contribute to acid reflux.  He is agreed to stop this.  I showed him an example of a ginger capsule that he could buy at [facility] or convenience store.  Of note his Esbriet intake is 2 pills 3 times daily due to drug interaction with mexiletine. For associated emphysema is on inhaler therapy.  Of note: His AST on 01/01/2021 was 44.  Other lab calls is abnormal even though it is below 45.  He was informed of this results.  The plan is to retest today.  He does tell me that he and his wife noticed that 1 day a few weeks ago that his left finger was yellow and after that it turned out to be normal after a few days.  Due to concerns of potential jaundice and worsening LFTs Arava and the ongoing GI side effects have asked him  to stop his Esbriet till further notice or 1 week    OV 03/09/2021  Subjective:  Patient ID: [name], male , DOB: 20-Dec-1940 , age 13 y.o. , MRN: [id number] , ADDRESS: [place] [address] PCP [name], MD Patient Care Team: [name], MD as PCP - General (Family Medicine) Festus Aloe, MD as Consulting Physician (Urology) Michael Boston, MD as Consulting  Physician (General Surgery) [name], MD as Consulting Physician (Cardiology) Jodi Marble, MD as Consulting Physician (Otolaryngology) Deboraha Sprang, MD as Consulting Physician (Cardiology) Luberta Mutter, MD as Consulting Physician (Ophthalmology) Lamonte Sakai Rose Fillers, MD as Consulting Physician (Pulmonary Disease) Mauri Pole, MD as Consulting Physician (Gastroenterology) [name], [facility] (Pharmacist)  This Provider for this visit: Treatment Team:  Attending Provider: Brand Males, MD    03/09/2021 -   Chief Complaint  Patient presents with   Follow-up    Pt states he is about the same since last visit. States he recently began pulmonary rehab.   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 -returns for his IPF follow-up.  He has further weight loss.  He is now down to 199 pounds although he is still overweight.  His BMI is over 28.  He is confident that his weight loss is because of medications.  It is otherwise drug-induced weight loss.  He believes the pirfenidone has reduced his appetite.  In addition Jardiance is also causing weight loss.  At this point in time he is happy with his weight loss because he is still overweight.  Overall he is tolerating his pirfenidone well.  He was having alternating diarrhea and constipation but after taking my suggestion and trialing out different regimens he is now settled on taking omeprazole along with ginger and MiraLAX in a scheduled way.  This is now helped prevent the alternating diarrhea and constipation.  He is quite happy with this.  He takes his pirfenidone at 2 pills 3 times daily and 5-6 hours apart.  It does interfere with the social schedule.  Sometimes family wants to have have dinner or lunch at different times.  He is asking for some  flexibility.  I encouraged him to try taking it slightly out of schedule and also with different types of snacks and experiment to see whether he has any side effects.  I did indicate to him ultimately the side effects of what will determine his tolerance.  He tells me that the omeprazole is doing quite well to control the GERD.  However the over-the-counter blister pack says it is only for 14 days.  He took clarification on this.  We discussed the long-term effects of omeprazole but overall taking balance versus risk I recommended that he continue his omeprazole for now.  In terms of his symptoms he stable.  He is attending pulmonary rehabilitation.  He says it is helping him.  His wife used to be a clinical trials coordinator.  He is interested in clinical trials.  However at this point in time he says his son is requested that he take a second opinion at [facility].  He is seeing Dr. Wynn Maudlin on 03/14/2021.  I did indicate to him that we are all part of the pulmonary fibrosis foundation network.  I did indicate to  him that Dr. Randol Kern is a well-known expert.  I have also indicated to him that I support the second opinion and I have personally emailed Dr. Randol Kern.  I have asked the patient to take CD-ROM of the CT scan to [facility].  Recent lab work 7 days ago shows normal creatinine and normal liver function test.  [facility] Dr Maxie Barb 03/14/21  [name] is a very pleasant 81 y.o.-year-old male with no significant prior pulmonary problems as a child or younger adult. He also has history of CAD, ICM/chronic systolic/diastolic HF s/p ICD, Afib on mexiletine, HTN, HLD. In 2021, he first noticed worsening shortness of breath on exertion but also attributed to COPD and Afib. Think it has been gradually worsening over the past 6 months but denies any recent illnesses. He was initially diagnosed based on imaging showing probable UIP during management of COPD. Followed by Dr. Romilda Garret. Never treated  with steroids. Negative autoimmune panel in 2017 and 09/2020. He was previously on amiodarone for 15-20 years and stopped in 2019 due to concerns for ILD. Started on Esbriet in Sept 2022. Initially had a lot of diarrhea and constipation, and now more tolerable with less GI issues since starting omeprazole and ginger root and miralax 2-3x /week. Wears sunscreen and UV protective clothing.   He reports breathing is ok. Biggest problem is dry mouth and cough productive light yellow/green sputum chronic for the past 4 years attributed to COPD and chronic sinus drainage. Takes Breztri, albuterol, ipratropium nasal sprays which helps. Able to clear sputum. Started pulmonary rehab about 3 weeks ago in mid Feb 2023 which has helped a lot. Doing it twice a week, first 15 minutes walking, 2nd 15 minutes with stationary elliptical, then stretching but has to rest 2-3 hours at home. No fever/chills/night sweats. Lost weight 211 to 193lb since starting Esbriet.  His current exercise capacity includes walking 15 minute every [place]   [name] is a very pleasant 81 y.o.-year-old male with no significant prior pulmonary problems as a child or younger adult. He also has history of CAD, ICM/chronic systolic/diastolic HF s/p ICD, Afib on mexiletine, HTN, HLD. In 2021, he first noticed worsening shortness of breath on exertion but also attributed to COPD and Afib. Think it has been gradually worsening over the past 6 months but denies any recent illnesses. He was initially diagnosed based on imaging showing probable UIP during management of COPD. Followed by Dr. Romilda Garret. Never treated with steroids. Negative autoimmune panel in 2017 and 09/2020. He was previously on amiodarone for 15-20 years and stopped in 2019 due to concerns for ILD. Started on Esbriet in Sept 2022. Initially had a lot of diarrhea and constipation, and now more tolerable with less GI issues since starting  omeprazole and ginger root and miralax 2-3x /week. Wears sunscreen and UV protective clothing.   He reports breathing is ok. Biggest problem is dry mouth and cough productive light yellow/green sputum chronic for the past 4 years attributed to COPD and chronic sinus drainage. Takes Breztri, albuterol, ipratropium nasal sprays which helps. Able to clear sputum. Started pulmonary rehab about 3 weeks ago in mid Feb 2023 which has helped a lot. Doing it twice a week, first 15 minutes walking, 2nd 15 minutes with stationary elliptical, then stretching but has to rest 2-3 hours at home. No fever/chills/night sweats. Lost weight 211 to 193lb since starting Esbriet.  His current exercise capacity includes walking 15 minute every  morning [place]    [name] is a 81 y.o. 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 He has overall been stable to very slightly progressed on imaging and PFTs for more than 6-8 years which is atypical for IPF. [place] panel. Negative autoimmune labs. He has been appropriately managed with Esbriet, dose reduced due to interactions with mexiletine, as well as pulmonary rehab. He is also being closely followed by cardiology for systolic HF with last echo in 2021 showing no RV dysfunction. Recommend to continue current dose of Esbriet and encouraged to stay active while at home.  He also has a large esophagus on imaging and he reports severe acid reflux prior to starting Esbriet that is now better controlled on Esbriet. Aspiration and reflux unlikely to be primary cause of fibrosis but may be exacerbating his disease in predominantly basilar distribution so educated patient on aspiration precautions including raising HOB at least 30 degrees and not eating meals at least 2-3 hours before  bedtime. He will try to nap in his recliner with head raised instead of lying flat in bed.   OV 05/07/2021  Subjective:  Patient ID: [name], male , DOB: 1940-04-10 , age 9 y.o. , MRN: [id number] , ADDRESS: [place] [id number] PCP [name], MD Patient Care Team: [name], MD as PCP - General (Family Medicine) Festus Aloe, MD as Consulting Physician (Urology) Michael Boston, MD as Consulting Physician (General Surgery) Stanford Breed Denice Bors, MD as Consulting Physician (Cardiology) Jodi Marble, MD as Consulting Physician (Otolaryngology) Deboraha Sprang, MD as Consulting Physician (Cardiology) Luberta Mutter, MD as Consulting Physician (Ophthalmology) Lamonte Sakai Rose Fillers, MD as Consulting Physician (Pulmonary Disease) Mauri Pole, MD as Consulting Physician (Gastroenterology) [name], [facility] (Pharmacist) Dimitri Ped, RN as Case Manager  This Provider for this visit: Treatment Team:  Attending Provider: Brand Males, MD    05/07/2021 -   Chief Complaint  Patient presents with   Follow-up    Pt states he has been doing well since last visit and denies any complaints.    Joel [name] 81 y.o. -returns for follow-up.  After last visit he did see Dr. Wynn Maudlin at [facility].  I reviewed the notes.  Really appreciate Dr. Randol Kern adding an extra perspective.  His notes are captured above.  Patient continues on pirfenidone at 2 pills 3 times daily in the setting of mexiletine.  He has completed rehabilitation.  Has had 5 COVID boosters.  He asked me about taking more booster.  I recommended to hold off but again emphasized his risk benefit ratio.  Did express that if he got COVID he could take antiviral.  We should wait till a new vaccine for the new strains comes up.  At this point in time the vaccine is for the old strains.  He feels overall stable after taking pirfenidone and also pulmonary  rehabilitation.  He says this is the best he is felt.  He continues with his alternating diarrhea and constipation but it is actually improved.  Because of this he does not want to do any clinical trial that might have GI side effects.  He is interested in clinical trials but at this point in time he wants to hold off.  He got a letter from Dr. Silverio Decamp about undergoing colonoscopy.  I did approve this.  He continues to lose weight he says this is unintentional and account of pirfenidone and his diabetic drug.  Currently the level of weight loss is healthy.  Of note he has chronic systolic heart failure.  Last echocardiogram was in 2021.  He is going to see Dr. Stanford Breed in June 2023.  I did recommend getting an echocardiogram before he saw Dr. Stanford Breed and I will order this I had of Dr. Jacalyn Lefevre visit.  He is okay with this plan.  He wants to have this done . HAve this done in [place] area    CT Chest data  No results found.    PFT  OV 08/03/2021  Subjective:  Patient ID: [name], male , DOB: [birth year] , age 63 y.o. , MRN: [id number] , ADDRESS: [place] [id number] PCP [name], MD Patient Care Team: [name], MD as PCP - General (Family Medicine) Festus Aloe, MD as Consulting Physician (Urology) Michael Boston, MD as Consulting Physician (General Surgery) Stanford Breed Denice Bors, MD as Consulting Physician (Cardiology) Jodi Marble, MD as Consulting Physician (Otolaryngology) Deboraha Sprang, MD as Consulting Physician (Cardiology) Luberta Mutter, MD as Consulting Physician (Ophthalmology) Lamonte Sakai Rose Fillers, MD as Consulting Physician (Pulmonary Disease) Mauri Pole, MD as Consulting Physician (Gastroenterology) [name], [facility] (Pharmacist)  This Provider for this visit: Treatment Team:  Attending Provider: Brand Males, MD    08/03/2021 -   Chief Complaint  Patient presents with   Follow-up     Follow-up IPF cough, SOB, Wheezing     [name] 81 y.o. -+ returns for follow-up.  Since his last visit he continues to be stable from a dyspnea standpoint.  Is undergoing pulmonary rehabilitation.  On the pulmonary function test is a little bit waxing and waning but I would say the overall trend in the last 1 year appears stable although definitely progressive in the last 3 years.  It appears that since starting pirfenidone he is likely stable.  He is on the lower dose pirfenidone.  The main issue is that he is struggling with the pirfenidone.  His fatigue is a little more than usual.  He is also having alternating constipation and diarrhea.  However he is able to be stable on this.  Also he says that since attending rehabilitation his got some right infrascapular infra axillary muscle pull symptoms.  He is wondering if it is because of rehabilitation.  His CT scan late last year did not show any abnormalities consistent with a mass.  He has had weight loss because of pirfenidone and also Jardiance.  Although he feels this is stabilized.  He feels it is in a happy point.  He had an echocardiogram and his ejection fraction is 35%.   PFT   OV 11/08/2021  Subjective:  Patient ID: [name], male , DOB: 1940/04/12 , age 38 y.o. , MRN: [id number] , ADDRESS: [place] [id number] PCP [name], MD Patient Care Team: [name], MD as PCP - General (Family Medicine) Festus Aloe, MD as Consulting Physician (Urology) Michael Boston, MD as Consulting Physician (General Surgery) Stanford Breed, Denice Bors, MD as Consulting Physician (Cardiology) Jodi Marble, MD as Consulting Physician (Otolaryngology) Deboraha Sprang, MD as Consulting Physician (Cardiology) Luberta Mutter, MD as Consulting Physician (Ophthalmology) Lamonte Sakai Rose Fillers, MD as Consulting Physician (Pulmonary Disease) Silverio Decamp,  Venia Minks, MD as Consulting Physician  (Gastroenterology) [name], [facility] (Pharmacist)  This Provider for this visit: Treatment Team:  Attending Provider: Brand Males, MD   IPF dx given Sept 2022 -> started esbriet  COPD associated  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 -returns for follow-up.  Presents with his wife.  Wife is returning after a long time.  She is to work in clinical trials.  He is feeling stable although he is continue to lose weight.  He feels the low-dose of Esbriet is being well-tolerated except for significant fatigue which she attributes to pirfenidone.  He is continue to lose weight which is a combination of Jardiance for his heart failure and also his pirfenidone.  He still technically overweight with a current weight with a BMI of 26 but he is getting close to ideal body weight.  He is aware that if he starts going below ideal body weight then we will have to make decisions about stopping pirfenidone or Jardiance.  Last visit there was some confusion but this pulmonary function test was worsening so we got a high-resolution CT chest and this shows stability for the last 1 year suggesting beneficial effects of pirfenidone/Esbriet.  However he does have progressive disease over the course of few years.  His GI symptoms are more stable at this point.  Even though he has a lot of fatigue he is willing to continue with pirfenidone  He is interested in clinical trials as a care option.  His wife worked in clinical trials he understands this experimental nature of clinical trials with drug development he  understands the placebo concept.  We discussed the fact we have 5 trials going on.  I do not recommend 2 which had significant GI side effects.  He does not qualify for cough study because he is on carvedilol and this drug interactions.  He is aware that studies of strict inclusion exclusion criteria and his medical problems from his heart or EKG or drug interaction issues could preclude him.  With that in mind we discussed the potential of the following studies  Inhaled treprostinil: Preliminary study shows beneficial effects and IPF with lung function improvement.  This medicine is available in the market for many years for pulmonary hypertension.  He will have to take it as a nebulizer 4 times daily and each time 9-12 times.  The study visits are not too cumbersome.  However he finds this inconvenient from a medication administration standpoint  Subcutaneous injection monoclonal antibody Vixarelimab: Every 2 weeks.  Evidence is only on prurigo appears 1 yesterday with open label extension.  Injections every 2 weeks the significant number of visits.  We discussed side effect profile on this based on established literature.  He is open to this idea but wants a second choice.  50% chance of placebo   Oral tablet Bexotegrast - Pliant study.  33% chance of placebo.  Well-tolerated in the early phase to 71-monthstudy.  Minimal GI side effects.  Initial improvement over 4 weeks and then some tailing off on  preliminary data but still significant improvement compared to placebo.  He is interested in this.  The study is not activated at our location.  I gave him publicly available information of the above studies to him and his wife.    Symptoms:  SYMPTOM SCALE - ILD 11/08/2020 12/22/2020 Esbriet since sept/pct 2022 01/19/2021 Esbreit 2 pills tid 03/09/2021  05/07/2021  08/03/2021  11/08/2021   Current weight 209# 207# 206# 199# 194# 190# - esbriet 185# - esbrieet, jardiance  O2 use ra ra       Shortness  of Breath 0 -> 5 scale with 5 being worst (score 6 If unable to do)        At rest 0 0 0 0 0 0 0  0Simple tasks - showers, clothes change, eating, shaving 0 0 0 0 0 0 1  Household (dishes, doing bed, laundry) 0 0 0 0 0 0 1  Shopping 3 2 0 _0 Walking level at own pace _1 Walking up Stairs _2 Total (30-36) Dyspnea Score 12 x 2 years  same Since onset _3 How bad is your cough? moderate x  2years. Getting worse. Has light yellow/green sputum. Clears throat _4 How bad is your fatigue light _5 How bad is nausea 0 _6 0 3 2  How bad is vomiting?  0 0 0 0 0 0 0  How bad is diarrhea? 0 _7 How bad is anxiety? 0 _8 0 2 0  How bad is depression 0 1 2 0 0 0 0  Any chronic pain - if so where and how bad 0     x x     Simple office walk 185 feet x  3 laps goal with forehead probe 10/05/2020  03/09/2021  08/03/2021   O2 used ra ra ra  Number laps completed _9 Comments about pace avg avg nl  Resting Pulse Ox/HR 100% and 70/min 99% and 70 97% and HR 60  Final Pulse Ox/HR 97% and 82/min 97% and 82 98% and HR 72  Desaturated </= 88% no no np  Desaturated <= 3% points yes no no  Got Tachycardic >/= 90/min no no no  Symptoms at end of test Mild dyspnea no no  Miscellaneous comments x x     PFT     Latest Ref Rng & Units 08/03/2021    2:41 PM 01/19/2021   12:54 PM 09/27/2020    9:51 AM 02/09/2018    1:53 PM 03/12/2017    1:55 PM 02/08/2015    9:53 AM  PFT Results  FVC-Pre L 2.43  2.67  2.34  3.02  2.82  3.20   FVC-Predicted Pre % 57  63  55  69  64  71   FVC-Post L   2.44  2.97  2.78  3.19   FVC-Predicted Post %   57  67  63  71   Pre FEV1/FVC % % 87  82  84  82  82  82   Post FEV1/FCV % %   82  82  83  82   FEV1-Pre L 2.11  2.19  1.98  2.48  2.30  2.61   FEV1-Predicted Pre % 69  72  65  78  72  80   FEV1-Post L   2.00  2.42  2.30  2.61   DLCO uncorrected ml/min/mmHg 14.82  14.09  14.62  18.10  18.46  19.06   DLCO  [facility]% % 58  55  57  53  54  56   DLCO corrected ml/min/mmHg 14.82  14.09  14.62  17.90     DLCO COR %Predicted % 58  55  57  53     DLVA Predicted % 103  93  101  90  95  90   TLC L   4.17  4.65  4.94  4.59   TLC % Predicted %   57  64  68  63   RV % Predicted %   46  56  68  44     HRCT 10/03/21  IMPRESSION: 1. Pulmonary parenchymal pattern of fibrosis appears grossly stable from 09/22/2020 but progressive from 02/17/2018. Findings are categorized as probable UIP per consensus guidelines: Diagnosis of Idiopathic Pulmonary Fibrosis: An Official ATS/ERS/JRS/ALAT Clinical Practice Guideline. [place], Iss 5, [phone], Sep 14 2016. 2. Cholelithiasis. 3. Aortic atherosclerosis (ICD10-I70.0). Coronary artery calcification. 4.  Emphysema (ICD10-J43.9).     Electronically Signed   By: Lorin Picket M.D.   On: 10/04/2021 13:46   has a past medical history of Adenomatous colon polyp (2009; 06/2012), AICD (automatic cardioverter/defibrillator) present, Atrial fibrillation ([place]), Bilateral sensorineural hearing loss, BPH (benign prostatic hypertrophy), CAD (coronary artery disease), CHF (congestive heart failure) ([place]), Chronic constipation, Chronic rhinitis, COPD (chronic obstructive pulmonary disease) ([place]), Diverticulosis of colon (2005), Gallstones (06/2014), GERD (gastroesophageal reflux disease), Hepatic steatosis (2010; 2016), Hyperlipidemia, Hypertension, Ischemic cardiomyopathy, Microscopic hematuria, Nephrolithiasis, Obstructive sleep apnea, Presence of permanent cardiac pacemaker, Squamous cell carcinoma of back, and Ventricular tachycardia ([place]).   reports that he quit smoking about 41 years ago. His smoking use included cigarettes. He has a 30.00 pack-year smoking history. He has never used smokeless tobacco.  Past Surgical History:  Procedure Laterality Date   ATRIAL ABLATION SURGERY     flutter ablation 2002   BREAST SURGERY Left 1990   "Tissue removed  from breast"   CARDIAC CATHETERIZATION     CARDIOVERSION N/A 05/10/2020   Procedure: CARDIOVERSION;  Surgeon: Sanda Klein, MD;  Location: [facility] END[facility];  Service: Cardiovascular;  Laterality: N/A;   carotid doppler  09/25/12   No signif stenosis   COLONOSCOPY N/A 07/13/2012   Diverticula ascending colon.  Polyp x 1-recall 5 yrs (Dr. Watt Climes) Procedure: COLONOSCOPY;  Surgeon: Jeryl Columbia, MD;  Location: [facility] ENDOSCOPY;  Service: Endoscopy;  Laterality: N/A; polypectomy x 1    COLONOSCOPY WITH PROPOFOL N/A 08/18/2015   Procedure: COLONOSCOPY WITH PROPOFOL;  Surgeon: Mauri Pole, MD;  Location: [facility] ENDOSCOPY;  Service: Endoscopy;  Laterality: N/A;   ESOPHAGOGASTRODUODENOSCOPY N/A 07/13/2012   Procedure: ESOPHAGOGASTRODUODENOSCOPY (EGD);  Surgeon: Jeryl Columbia, MD;  Location: [facility];  Service: Endoscopy;  Laterality: N/A;  NORMAL   EXTRACORPOREAL SHOCK WAVE LITHOTRIPSY     HEMORRHOID BANDING     ICD     a) Guidant Contak H170- Virl Axe, MD 07/25/06 (second device) b) Medtronic CRT-D   ICD GENERATOR CHANGEOUT N/A 02/14/2020   Procedure: ICD GENERATOR CHANGEOUT;  Surgeon: Deboraha Sprang, MD;  Location: [facility];  Service: Cardiovascular;  Laterality: N/A;   INGUINAL HERNIA REPAIR Bilateral 1975   LEAD REVISION Bilateral 12/26/2011   Procedure: LEAD REVISION;  Surgeon: Evans Lance,  MD;  Location: [facility] CATH LAB;  Service: Cardiovascular;  Laterality: Bilateral;   PFTs  02/2015   Mod restriction (interstitial), mod diffusion defect (Dr. Annamaria Boots)   precancer area keratsis removed  05/2015    leftf orehead and left arm and right arm and back    Allergies  Allergen Reactions   Sulfa Antibiotics Rash    Immunization History  Administered Date(s) Administered   Fluad Quad(high Dose 65+) 09/23/2018, 10/05/2020, 11/08/2021   Influenza Split 11/11/2010, 11/02/2012, 10/14/2013   Influenza Whole 09/27/2011, 11/13/2011   Influenza, High Dose Seasonal PF 09/26/2015, 10/07/2016    Influenza, Seasonal, Injecte, Preservative Fre 09/23/2018   Influenza,inj,Quad PF,6+ Mos 11/03/2016, 09/20/2017   Influenza-Unspecified 09/26/2014, 10/15/2019   PFIZER Comirnaty(Gray Top)Covid-19 Tri-Sucrose Vaccine 04/24/2020   PFIZER(Purple Top)SARS-COV-2 Vaccination 02/02/2019, 02/22/2019, 10/26/2019, 09/29/2020   Pneumococcal Conjugate-13 06/15/2014   Pneumococcal Polysaccharide-23 03/15/2002, 07/10/2015   Td 08/27/2017   Tdap 04/04/2010   Zoster Recombinat (Shingrix) 03/29/2016   Zoster, Live 05/15/2015    Family History  Problem Relation Age of Onset   Heart disease Mother    Other Mother        colon surgery for fistula   Gallstones Mother    Breast cancer Maternal Grandmother    Coronary artery disease Father        family hx of   Heart attack Father        4 stents/ pacemaker   Nephrolithiasis Father    Hypertension Sister    Goiter Paternal Grandmother    Nephrolithiasis Son    Colon cancer Neg Hx      Current Outpatient Medications:    antiseptic oral rinse (BIOTENE) LIQD, 15 mLs by Mouth Rinse route in the morning and at bedtime., Disp: , Rfl:    BREZTRI AEROSPHERE 160-9-4.8 MCG/ACT AERO, INHALE 2 PUFFS INTO THE LUNGS IN THE MORNING AND AT BEDTIME, Disp: 10.7 g, Rfl: 6   carvedilol (COREG) 3.125 MG tablet, Take 1 tablet (3.125 mg total) by mouth 2 (two) times daily with a meal., Disp: 180 tablet, Rfl: 3   cholecalciferol (VITAMIN D) 1000 units tablet, Take 1,000 Units by mouth every evening., Disp: , Rfl:    empagliflozin (JARDIANCE) 10 MG TABS tablet, Take 1 tablet (10 mg total) by mouth daily before breakfast., Disp: 90 tablet, Rfl: 3   eplerenone (INSPRA) 25 MG tablet, TAKE 1 TABLET(25 MG) BY MOUTH DAILY, Disp: 90 tablet, Rfl: 3   furosemide (LASIX) 40 MG tablet, Take 2 tablets (80 mg total) by mouth daily., Disp: 180 tablet, Rfl: 3   Ginger, Zingiber officinalis, (GINGER ROOT) 550 MG CAPS, Take 1 capsule by mouth daily., Disp: , Rfl:    ipratropium (ATROVENT)  0.06 % nasal spray, Place 2 sprays into both nostrils 3 (three) times daily., Disp: 15 mL, Rfl: 1   levalbuterol (XOPENEX HFA) 45 MCG/ACT inhaler, Inhale 2 puffs into the lungs every 4 (four) hours as needed for wheezing., Disp: 1 each, Rfl: 4   loratadine (CLARITIN) 10 MG tablet, Take 10 mg by mouth in the morning., Disp: , Rfl:    Magnesium 250 MG TABS, Take 250 mg by mouth every evening., Disp: , Rfl:    mexiletine (MEXITIL) 150 MG capsule, TAKE 2 CAPSULES(300 MG) BY MOUTH TWICE DAILY, Disp: 360 capsule, Rfl: 2   Multiple Vitamin (MULTIVITAMIN WITH MINERALS) TABS, Take 1 tablet by mouth every evening., Disp: , Rfl:    nitroGLYCERIN (NITROSTAT) 0.4 MG SL tablet, DISSOLVE 1 TABLET UNDER THE TONGUE EVERY 5 MINUTES AS  NEEDED FOR CHEST PAIN, Disp: 25 tablet, Rfl: 5   Olopatadine HCl (PATADAY OP), Apply 1 drop to eye daily., Disp: , Rfl:    Omeprazole 20 MG TBEC, Take 1 tablet (20 mg total) by mouth daily., Disp: 90 tablet, Rfl: 3   Pirfenidone (ESBRIET) 267 MG TABS, Take 2 tablets (534 mg total) by mouth with breakfast, with lunch, and with evening meal., Disp: 540 tablet, Rfl: 1   polyvinyl alcohol-povidone (HYPOTEARS) 1.4-0.6 % ophthalmic solution, Place 1-2 drops into both eyes in the morning and at bedtime., Disp: , Rfl:    potassium chloride SA (KLOR-CON M) 20 MEQ tablet, Take 1 tablet (20 mEq total) by mouth daily with lunch., Disp: 90 tablet, Rfl: 3   rivaroxaban (XARELTO) 20 MG TABS tablet, TAKE 1 TABLET(20 MG) BY MOUTH DAILY WITH SUPPER, Disp: 90 tablet, Rfl: 3   rosuvastatin (CRESTOR) 40 MG tablet, TAKE 1 TABLET(40 MG) BY MOUTH EVERY EVENING, Disp: 90 tablet, Rfl: 3   sacubitril-valsartan (ENTRESTO) 24-26 MG, Take 1 tablet by mouth 2 (two) times daily., Disp: 180 tablet, Rfl: 3   Simethicone (GAS-X PO), Take by mouth as directed., Disp: , Rfl:    clotrimazole (MYCELEX) 10 MG troche, Dissolve, swish and swallow one tablet by mouth 5 times daily for 14 days., Disp: 70 tablet, Rfl: 1       Objective:   Vitals:   11/08/21 1134  BP: 102/68  Pulse: (!) 57  Temp: 97.7 F (36.5 C)  TempSrc: Oral  SpO2: 98%  Weight: 185 lb 6.4 oz (84.1 kg)  Height: _0  (1.778 m)    Estimated body mass index is 26.6 kg/m as calculated from the following:   Height as of this encounter: _1  (1.778 m).   Weight as of this encounter: 185 lb 6.4 oz (84.1 kg).  _2 @  Filed Weights   11/08/21 1134  Weight: 185 lb 6.4 oz (84.1 kg)     Physical Exam General: No distress. Looks well Neuro: Alert and Oriented x 3. GCS 15. Speech normal Psych: Pleasant Resp:  Barrel Chest - no.  Wheeze - no, Crackles - yes, No overt respiratory distress CVS: Normal heart sounds. Murmurs - no Ext: Stigmata of Connective Tissue Disease - no HEENT: Normal upper airway. PEERL +. No post nasal drip        Assessment:       ICD-10-CM   1. IPF (idiopathic pulmonary fibrosis) (HCC)  J84.112 Hepatic function panel    Pulmonary function test    2. Need for immunization against influenza  Z23 Flu Vaccine QUAD High Dose(Fluad)    3. Drug-induced weight loss  R63.4    T50.905A     4. Medication monitoring encounter  Z51.81     5. Pulmonary emphysema with fibrosis of lung ([place])  J43.9    J84.10          Plan:     Patient Instructions  Pulmnary Fibrosis with emphysema ILD (interstitial lung disease) ([place]) IPF (idiopathic pulmonary fibrosis) (HCC)  - IPF is  stable x 1 year on CT on  low dose ebreit trough progressed in 3 years - -Currently on Esbriet 2 pills 3 times daily   -  being capped because baseline mexilitine can increase esbriet levels  - esbiret contributing to fatigue   Plan - high dose flu sho 11/08/2021 - check LFT 11/08/2021 - continue esbriet 2 pills x 3 times daily  - controlling GI symptoms with omeprazole, miralax, ginger regimen as needed -  Continue pulmonary rehab  - appreciate clinical trial interst    - take information on pill study by [employer] and  injection sttydy by Celso Amy - spiro/dlco in 3-4 months  Chronic obstructive pulmonary disease, unspecified COPD type ([place])  - not in flare up  Plan  - cotninue breztri daily - albueo as needed - refill clotrimazole for thrush as needed  GERD - appears omeprazole helping you bu t you are worried about kidney side effect  Plan  - stop omeprazole  Weight loss- drug induced Low appetite   -ongoing weight loss  due to esbriet and jardiance  but seems stabilized - currently 180#  Plan  -monitor; ok to tolerate weight loss till 170- 175#   Followup - 3-4 months in 30 min visit after PFT; but can cancel if on ressearch  ( Level 05 visit: Estb 40-54 min   visit type: on-site physical face to visit  in total care time and counseling or/and coordination of care by this undersigned MD - Dr Brand Males. This includes one or more of the following on this same day 11/08/2021: pre-charting, chart review, note writing, documentation discussion of test results, diagnostic or treatment recommendations, prognosis, risks and benefits of management options, instructions, education, compliance or risk-factor reduction. It excludes time spent by the [place] or office staff in the care of the patient. Actual time 84 min)   SIGNATURE    Dr. Brand Males, M.D., F.C.C.P,  Pulmonary and Critical Care Medicine Staff Physician, [place] Director - Interstitial Lung Disease  Program  [facility] [place] [facility] [place], [place], [address]  Pager: [phone], If no answer or between  15:00h - 7:00h: call [phone] Telephone: [phone]  12:28 PM 11/08/2021

## 2021-11-09 ENCOUNTER — Telehealth: Payer: Self-pay | Admitting: Internal Medicine

## 2021-11-09 ENCOUNTER — Other Ambulatory Visit (HOSPITAL_COMMUNITY): Payer: Self-pay

## 2021-11-09 ENCOUNTER — Other Ambulatory Visit (INDEPENDENT_AMBULATORY_CARE_PROVIDER_SITE_OTHER): Payer: Medicare Other

## 2021-11-09 DIAGNOSIS — J84112 Idiopathic pulmonary fibrosis: Secondary | ICD-10-CM

## 2021-11-09 LAB — HEPATIC FUNCTION PANEL
ALT: 34 U/L (ref 0–53)
AST: 31 U/L (ref 0–37)
Albumin: 4 g/dL (ref 3.5–5.2)
Alkaline Phosphatase: 57 U/L (ref 39–117)
Bilirubin, Direct: 0.1 mg/dL (ref 0.0–0.3)
Total Bilirubin: 0.6 mg/dL (ref 0.2–1.2)
Total Protein: 6.7 g/dL (ref 6.0–8.3)

## 2021-11-09 NOTE — Telephone Encounter (Signed)
error

## 2021-11-09 NOTE — Telephone Encounter (Signed)
Received a call from [facility] about pt's re-enrollment application. They are needing to clarify pt's insurance as they have on file that pt has multiple insurances. Also they want to know if a benefits investigation was done with pt's new insurance.  Please advise.

## 2021-11-09 NOTE — Telephone Encounter (Signed)
Per review, patient has [employer] (same as before). He had Medicare A/B + BCBS supplement (medical supplement and does not provide supplemental cost coverage for pharmacy benefits). Confirmed all of the benefits on file.  Phone: [phone]  Knox Saliva, PharmD, MPH, BCPS, CPP Clinical Pharmacist (Rheumatology and Pulmonology)

## 2021-11-13 ENCOUNTER — Ambulatory Visit (INDEPENDENT_AMBULATORY_CARE_PROVIDER_SITE_OTHER): Payer: Medicare Other

## 2021-11-13 VITALS — Ht 70.0 in | Wt 181.0 lb

## 2021-11-13 DIAGNOSIS — Z0000 Encounter for general adult medical examination without abnormal findings: Secondary | ICD-10-CM

## 2021-11-13 NOTE — Progress Notes (Signed)
Subjective:   [name] is a 81 y.o. male who presents for Medicare Annual/Subsequent preventive examination.   I connected with  [name] on 11/13/21 by a audio enabled telemedicine application and verified that I am speaking with the correct person using two identifiers.  Patient Location: Home  Provider Location: Home Office  I discussed the limitations of evaluation and management by telemedicine. The patient expressed understanding and agreed to proceed.  Review of Systems     Cardiac Risk Factors include: advanced age (>71mn, >>63women);hypertension;male gender     Objective:    Today's Vitals   11/13/21 1322  Weight: 181 lb (82.1 kg)  Height: _0  (1.778 m)   Body mass index is 25.97 kg/m.     11/13/2021    1:27 PM 04/25/2021    3:01 PM 02/28/2021   11:16 AM 11/06/2020   12:07 PM 05/10/2020    9:55 AM 02/14/2020   11:49 AM 11/01/2019    1:35 PM  Advanced Directives  Does Patient Have a Medical Advance Directive? _1  Yes Yes  Type of AParamed[place]of [place] will [place] will Out of facility DNR (pink MOST or yellow form);Living will;Healthcare Power of A[place] OakLiving will [place]Living will  Does patient want to make changes to medical advance directive? No - Patient declined No - Patient declined No - Patient declined      Copy of HEast Bradyin Chart? Yes - validated most recent copy scanned in chart (See row information) No - copy requested  Yes - validated most recent copy scanned in chart (See row information) Yes - validated most recent copy scanned in chart (See row information) Yes - validated most recent copy scanned in chart (See row information) No - copy requested    Current Medications (verified) Outpatient Encounter Medications as of  11/13/2021  Medication Sig   antiseptic oral rinse (BIOTENE) LIQD 15 mLs by Mouth Rinse route in the morning and at bedtime.   BREZTRI AEROSPHERE 160-9-4.8 MCG/ACT AERO INHALE 2 PUFFS INTO THE LUNGS IN THE MORNING AND AT BEDTIME   carvedilol (COREG) 3.125 MG tablet Take 1 tablet (3.125 mg total) by mouth 2 (two) times daily with a meal.   cholecalciferol (VITAMIN D) 1000 units tablet Take 1,000 Units by mouth every evening.   clotrimazole (MYCELEX) 10 MG troche Dissolve, swish and swallow one tablet by mouth 5 times daily for 14 days.   empagliflozin (JARDIANCE) 10 MG TABS tablet Take 1 tablet (10 mg total) by mouth daily before breakfast.   eplerenone (INSPRA) 25 MG tablet TAKE 1 TABLET(25 MG) BY MOUTH DAILY   furosemide (LASIX) 40 MG tablet Take 2 tablets (80 mg total) by mouth daily.   Ginger, Zingiber officinalis, (GINGER ROOT) 550 MG CAPS Take 1 capsule by mouth daily.   ipratropium (ATROVENT) 0.06 % nasal spray Place 2 sprays into both nostrils 3 (three) times daily.   levalbuterol (XOPENEX HFA) 45 MCG/ACT inhaler Inhale 2 puffs into the lungs every 4 (four) hours as needed for wheezing.   loratadine (CLARITIN) 10 MG tablet Take 10 mg by mouth in the morning.   Magnesium 250 MG TABS Take 250 mg by mouth every evening.   mexiletine (MEXITIL) 150 MG capsule TAKE 2 CAPSULES(300 MG) BY MOUTH TWICE DAILY   Multiple Vitamin (MULTIVITAMIN WITH MINERALS) TABS Take 1  tablet by mouth every evening.   nitroGLYCERIN (NITROSTAT) 0.4 MG SL tablet DISSOLVE 1 TABLET UNDER THE TONGUE EVERY 5 MINUTES AS NEEDED FOR CHEST PAIN   Olopatadine HCl (PATADAY OP) Apply 1 drop to eye daily.   Omeprazole 20 MG TBEC Take 1 tablet (20 mg total) by mouth daily.   Pirfenidone (ESBRIET) 267 MG TABS Take 2 tablets (534 mg total) by mouth with breakfast, with lunch, and with evening meal.   polyvinyl alcohol-povidone (HYPOTEARS) 1.4-0.6 % ophthalmic solution Place 1-2 drops into both eyes in the morning and at bedtime.    potassium chloride SA (KLOR-CON M) 20 MEQ tablet Take 1 tablet (20 mEq total) by mouth daily with lunch.   rivaroxaban (XARELTO) 20 MG TABS tablet TAKE 1 TABLET(20 MG) BY MOUTH DAILY WITH SUPPER   rosuvastatin (CRESTOR) 40 MG tablet TAKE 1 TABLET(40 MG) BY MOUTH EVERY EVENING   sacubitril-valsartan (ENTRESTO) 24-26 MG Take 1 tablet by mouth 2 (two) times daily.   Simethicone (GAS-X PO) Take by mouth as directed.   No facility-administered encounter medications on file as of 11/13/2021.    Allergies (verified) Sulfa antibiotics   History: Past Medical History:  Diagnosis Date   Adenomatous colon polyp 2009; 06/2012   Colonoscopy 07/2007---Dr. Barron Schmid GI--Repeat 06/2012 showed one tubular adenoma w/out high grade dysplasia.   AICD (automatic cardioverter/defibrillator) present    Chemical engineer   Atrial fibrillation ([place])    Bilateral sensorineural hearing loss    BPH (benign prostatic hypertrophy)    PSAs ok (followed by urologist): no LUTS   CAD (coronary artery disease)    PTCA RCA 1997 (Inf/post MI);   CHF (congestive heart failure) ([place])    Chronic constipation    Chronic rhinitis    ?vasomotor (Dr. Erik Obey)   COPD (chronic obstructive pulmonary disease) ([place])    Diverticulosis of colon 2005   Noted on Colonoscopies in 2005 and 2009 and on CT 2010   Gallstones 06/2014   No evidence of cholecystitis on abd u/s 06/2014   GERD (gastroesophageal reflux disease)    Hepatic steatosis 2010; 2016   Noted on CT abd 2010 and on u/s abd 04/2009 (transaminasemia) and u/s 06/2014.   Hyperlipidemia    Hypertension    Ischemic cardiomyopathy    Microscopic hematuria    Cysto neg 02/2009 except for BPH-likely has microhem from his renal stone dz   Nephrolithiasis    No distal stones on CT 2010; stable 8 mm RLP stone, 96m LMP stone 10/2013 plain film.  Abd u/s 06/2014: 9 mm nonobstructing lower pole right renal stone.  03/2015 urol: stable nonobstructing renal calc by CT 12/2014 and KUB  03/2015.   Obstructive sleep apnea    no cpap uses since weight loss of 50 pounds   Presence of permanent cardiac pacemaker    [employer]   Squamous cell carcinoma of back    Ventricular tachycardia ([facility]    Past Surgical History:  Procedure Laterality Date   ATRIAL ABLATION SURGERY     flutter ablation 2002   BREAST SURGERY Left 1990   "Tissue removed from breast"   [place]   Procedure: CARDIOVERSION;  Surgeon: CSanda Klein MD;  [facility];  Service: Cardiovascular;  Laterality: N/A;   carotid doppler  09/25/12   No signif stenosis   COLONOSCOPY N/A 07/13/2012   Diverticula ascending colon.  Polyp x 1-recall 5 yrs (Dr. MWatt Climes Procedure: COLONOSCOPY;  Surgeon: MJeryl Columbia MD;  [facility]: [facility];  Service: Endoscopy;  Laterality: N/A; polypectomy x 1    COLONOSCOPY WITH PROPOFOL N/A 08/18/2015   Procedure: COLONOSCOPY WITH PROPOFOL;  Surgeon: Mauri Pole, MD;  Location: [facility];  Service: Endoscopy;  Laterality: N/A;   ESOPHAGOGASTRODUODENOSCOPY N/A 07/13/2012   Procedure: ESOPHAGOGASTRODUODENOSCOPY (EGD);  Surgeon: Jeryl Columbia, MD;  Location[facility];  Service: Endoscopy;  Laterality: N/A;  NORMAL   EXTRACORPOREAL SHOCK WAVE LITHOTRIPSY     HEMORRHOID BANDING     ICD     a) Guidant Contak H170- Virl Axe, MD 07/25/06 (second device) b) Medtronic CRT-D   ICD GENERATOR CHANGEOUT N/A 02/14/2020   Procedure: ICD GENERATOR CHANGEOUT;  Surgeon: Deboraha Sprang, MD;  Location: [facility];  Service: Cardiovascular;  Laterality: N/A;   INGUINAL HERNIA REPAIR Bilateral 1975   LEAD REVISION Bilateral 12/26/2011   Procedure: LEAD REVISION;  Surgeon: Evans Lance, MD;  Location: [facility];  Service: Cardiovascular;  Laterality: Bilateral;   PFTs  02/2015   Mod restriction (interstitial), mod diffusion defect (Dr. Annamaria Boots)   precancer area keratsis removed  05/2015    leftf [place] and left arm  and right arm and back   Family History  Problem Relation Age of Onset   Heart disease Mother    Other Mother        colon surgery for fistula   Gallstones Mother    Breast cancer Maternal Grandmother    Coronary artery disease Father        family hx of   Heart attack Father        4 stents/ pacemaker   Nephrolithiasis Father    Hypertension Sister    Goiter Paternal Grandmother    Nephrolithiasis Son    Colon cancer Neg Hx    Social History   Socioeconomic History   Marital status: Married    Spouse name: Not on file   Number of children: 3   Years of education: 16   Highest education level: Bachelor's degree (e.g., BA, AB, BS)  Occupational History   Occupation: Retired    Fish farm manager: RETIRED    Comment: Nurse, children's  Tobacco Use   Smoking status: Former    Packs/day: 2.00    Years: 15.00    Total pack years: 30.00    Types: Cigarettes    Quit date: 01/15/1980    Years since quitting: 41.8   Smokeless tobacco: Never  Vaping Use   Vaping Use: Never used  Substance and Sexual Activity   Alcohol use: No    Alcohol/week: 0.0 standard drinks of alcohol   Drug use: No   Sexual activity: Yes  Other Topics Concern   Not on file  Social History Narrative   Married.   Hx of cigarettes: quit in the 1980s.  No alc/drugs.   Social Determinants of Health   Financial Resource Strain: Low Risk  (11/13/2021)   Overall Financial Resource Strain (CARDIA)    Difficulty of Paying Living Expenses: Not hard at all  Food Insecurity: No Food Insecurity (11/13/2021)   Hunger Vital Sign    Worried About Running Out of Food in the Last Year: Never true    Ran Out of Food in the Last Year: Never true  Transportation Needs: No Transportation Needs (11/13/2021)   PRAPARE - Hydrologist (Medical): No    Lack of Transportation (Non-Medical): No  Physical Activity: Insufficiently Active (11/13/2021)   Exercise Vital Sign  Days of Exercise per Week:  5 days    Minutes of Exercise per Session: 10 min  Stress: No Stress Concern Present (11/13/2021)   [place]    Feeling of Stress : Not at all  Social Connections: Moderately Isolated (11/13/2021)   Social Connection and Isolation Panel [NHANES]    Frequency of Communication with Friends and Family: More than three times a week    Frequency of Social Gatherings with Friends and Family: More than three times a week    Attends Religious Services: Never    Marine scientist or Organizations: No    Attends Music therapist: Never    Marital Status: Married    Tobacco Counseling Counseling given: Not Answered   Clinical Intake:  Pre-visit preparation completed: Yes  Pain : No/denies pain     Nutritional Risks: None Diabetes: No  How often do you need to have someone help you when you read instructions, pamphlets, or other written materials from your doctor or pharmacy?: 1 - Never  Diabetic?no   Interpreter Needed?: No  Information entered by :: Jadene Pierini, LPN   Activities of Daily Living    11/13/2021    1:26 PM 11/16/2020   10:06 AM  In your present state of health, do you have any difficulty performing the following activities:  Hearing? 0 1  Comment  hearing aids  Vision? 0 0  Difficulty concentrating or making decisions? 0 1  Walking or climbing stairs? 0 1  Dressing or bathing? 0 0  Doing errands, shopping? 0 0  Preparing Food and eating ? N   Using the Toilet? N   In the past six months, have you accidently leaked urine? N   Do you have problems with loss of bowel control? N   Managing your Medications? N   Managing your Finances? N   Housekeeping or managing your Housekeeping? N     Patient Care Team: [name], MD as PCP - General (Family Medicine) Festus Aloe, MD as Consulting Physician (Urology) Michael Boston, MD as Consulting Physician (General  Surgery) [name], MD as Consulting Physician (Cardiology) Jodi Marble, MD as Consulting Physician (Otolaryngology) Deboraha Sprang, MD as Consulting Physician (Cardiology) Luberta Mutter, MD as Consulting Physician (Ophthalmology) Lamonte Sakai Rose Fillers, MD as Consulting Physician (Pulmonary Disease) Mauri Pole, MD as Consulting Physician (Gastroenterology) [name], [facility] (Pharmacist)  Indicate any recent Medical Services you may have received from other than [facility] providers in the past year (date may be approximate).     Assessment:   This is a routine wellness examination for [name].  Hearing/Vision screen Vision Screening - Comments:: Annual eye exams wear glasses   Dietary issues and exercise activities discussed: Current Exercise Habits: Home exercise routine, Type of exercise: walking, Time (Minutes): 10, Frequency (Times/Week): 5, Weekly Exercise (Minutes/Week): 50, Intensity: Mild, Exercise limited by: respiratory conditions(s)   Goals Addressed             This Visit's Progress    Increase physical activity   On track      Depression Screen    11/13/2021    1:26 PM 05/21/2021    2:21 PM 05/03/2021   12:47 PM 04/25/2021    2:59 PM 02/28/2021   11:21 AM 11/16/2020   10:08 AM 11/06/2020   12:30 PM  PHQ 2/9 Scores  PHQ - 2 Score 0 1 0 0 0 3 0  PHQ- 9  Score  6 0  2 6     Fall Risk    11/13/2021    1:23 PM 05/21/2021    2:21 PM 04/25/2021    2:59 PM 02/28/2021   11:18 AM 11/16/2020   10:08 AM  Fall Risk   Falls in the past year? 0 0 0 0 0  Number falls in past yr: 0 0 0 0   Injury with Fall? 0 0 0 0   Risk for fall due to : No Fall Risks No Fall Risks History of fall(s) History of fall(s)   Risk for fall due to: Comment    Decreased BP med to decrease falls risk   Follow up Falls prevention discussed Falls evaluation completed Education provided;Falls prevention discussed Education provided;Falls prevention discussed     FALL RISK  PREVENTION PERTAINING TO THE HOME:  Any stairs in or around the home? No  If so, are there any without handrails? No  Home free of loose throw rugs in walkways, pet beds, electrical cords, etc? Yes  Adequate lighting in your home to reduce risk of falls? Yes   ASSISTIVE DEVICES UTILIZED TO PREVENT FALLS:  Life alert? No  Use of a cane, walker or w/c? No  Grab bars in the bathroom? Yes  Shower chair or bench in shower? Yes  Elevated toilet seat or a handicapped toilet? Yes       06/11/2017    3:30 PM  MMSE - Mini Mental State Exam  Orientation to time 5  Orientation to Place 5  Registration 3  Attention/ Calculation 5  Recall 2  Language- name 2 objects 2  Language- repeat 1  Language- follow 3 step command 3  Language- read & follow direction 1  Write a sentence 1  Copy design 1  Total score 29        11/13/2021    1:27 PM 11/01/2019    1:45 PM  6CIT Screen  What Year? 0 points 0 points  What month? 0 points 0 points  What time? 0 points 0 points  Count back from 20 0 points 0 points  Months in reverse 0 points 0 points  Repeat phrase 0 points 0 points  Total Score 0 points 0 points    Immunizations Immunization History  Administered Date(s) Administered   Fluad Quad(high Dose 65+) 09/23/2018, 10/05/2020, 11/08/2021   Influenza Split 11/11/2010, 11/02/2012, 10/14/2013   Influenza Whole 09/27/2011, 11/13/2011   Influenza, High Dose Seasonal PF 09/26/2015, 10/07/2016   Influenza, Seasonal, Injecte, Preservative Fre 09/23/2018   Influenza,inj,Quad PF,6+ Mos 11/03/2016, 09/20/2017   Influenza-Unspecified 09/26/2014, 10/15/2019   PFIZER Comirnaty(Gray Top)Covid-19 Tri-Sucrose Vaccine 04/24/2020   PFIZER(Purple Top)SARS-COV-2 Vaccination 02/02/2019, 02/22/2019, 10/26/2019, 09/29/2020   Pneumococcal Conjugate-13 06/15/2014   Pneumococcal Polysaccharide-23 03/15/2002, 07/10/2015   Td 08/27/2017   Tdap 04/04/2010   Zoster Recombinat (Shingrix) 03/29/2016    Zoster, Live 05/15/2015    TDAP status: Up to date  Flu Vaccine status: Up to date  Pneumococcal vaccine status: Up to date  Covid-19 vaccine status: Completed vaccines  Qualifies for Shingles Vaccine? Yes   Zostavax completed Yes   Shingrix Completed?: Yes  Screening Tests Health Maintenance  Topic Date Due   COLONOSCOPY (Pts 45-29yr Insurance coverage will need to be confirmed)  08/17/2020   COVID-19 Vaccine (6 - Pfizer series) 11/24/2020   Medicare Annual Wellness (AWV)  11/14/2022   TETANUS/TDAP  08/28/2027   Pneumonia Vaccine 80 Years old  Completed   INFLUENZA VACCINE  Completed  HPV VACCINES  Aged Out   Zoster Vaccines- Shingrix  Discontinued    Health Maintenance  Health Maintenance Due  Topic Date Due   COLONOSCOPY (Pts 45-41yr Insurance coverage will need to be confirmed)  08/17/2020   COVID-19 Vaccine (6 - Pfizer series) 11/24/2020    Colorectal cancer screening: No longer required.   Lung Cancer Screening: (Low Dose CT Chest recommended if Age 80-80years, 30 pack-year currently smoking OR have quit w/in 15years.) does not qualify.   Lung Cancer Screening Referral: n/a  Additional Screening:  Hepatitis C Screening: does not qualify;   Vision Screening: Recommended annual ophthalmology exams for early detection of glaucoma and other disorders of the eye. Is the patient up to date with their annual eye exam?  Yes  Who is the provider or what is the name of the office in which the patient attends annual eye exams? Dr.McQuen  If pt is not established with a provider, would they like to be referred to a provider to establish care? No .   Dental Screening: Recommended annual dental exams for proper oral hygiene  Community Resource Referral / Chronic Care Management: CRR required this visit?  No   CCM required this visit?  No      Plan:     I have personally reviewed and noted the following in the patient's chart:   Medical and social  history Use of alcohol, tobacco or illicit drugs  Current medications and supplements including opioid prescriptions. Patient is not currently taking opioid prescriptions. Functional ability and status Nutritional status Physical activity Advanced directives List of other physicians Hospitalizations, surgeries, and ER visits in previous 12 months Vitals Screenings to include cognitive, depression, and falls Referrals and appointments  In addition, I have reviewed and discussed with patient certain preventive protocols, quality metrics, and best practice recommendations. A written personalized care plan for preventive services as well as general preventive health recommendations were provided to patient.     LDaphane Shepherd LPN   [id number]  Nurse Notes: none

## 2021-11-13 NOTE — Patient Instructions (Signed)
[name] , Thank you for taking time to come for your Medicare Wellness Visit. I appreciate your ongoing commitment to your health goals. Please review the following plan we discussed and let me know if I can assist you in the future.   These are the goals we discussed:  Goals      Increase physical activity     PharmD Care Plan     CARE PLAN ENTRY  Current Barriers:  Chronic Disease Management support, education, and care coordination needs related to Hypertension, Hyperlipidemia, Atrial Fibrillation, Heart Failure, and COPD   Hypertension / Heart Failure Pharmacist Clinical Goal(s): Over the next 180 days, patient will work with PharmD and providers to maintain BP goal <130/80 Current regimen:  Entresto 24-26 twice daily  Coreg 6.25 mg twice daily  Eplerenone 25 mg daily Furosemide 80 mg daily Klor-con 20 meq daily  Interventions: Continue current management Patient self care activities - Over the next 180 days, patient will: Check BP at least once every 1-2 days, document, and provide at future appointments Ensure daily salt intake < 2300 mg/day  Hyperlipidemia Pharmacist Clinical Goal(s): Over the next 180 days, patient will work with PharmD and providers to maintain LDL goal < 70 Current regimen:  Rosuvastatin 40 mg once daily Interventions: Continue current management Patient self care activities - Over the next 180 days, patient will: Continue current management  Afib Pharmacist Clinical Goal(s) Over the next 180 days, patient will work with PharmD and providers to minimize symptoms of afib and maintain rate/rhythm control. Current regimen:  Xarelto 20 mg daily with supper Mexitil 150 mg caps - two caps twice daily  Interventions: Continue current management Patient self care activities - Over the next 180 days, patient will: Continue current management  Medication management Pharmacist Clinical Goal(s): Over the next 180 days, patient will work with PharmD  and providers to maintain optimal medication adherence Current pharmacy: [facility] Interventions Comprehensive medication review performed. Continue current medication management strategy Patient self care activities - Over the next 180 days, patient will: Focus on medication adherence by continuing current medication management Take medications as prescribed Report any questions or concerns to PharmD and/or provider(s) COPD Pharmacist Clinical Goal(s) Over the next 180 days, patient will work with PharmD and providers to minimize COPD symptoms Current regimen:  Symbicort 160-4.5 two puff twice daily Ventolin 1-2 puffs every 4 hours as needed Interventions: Continue current management Patient self care activities - Over the next 180 days, patient will: Continue current management Initial goal documentation.     Weight (lb) < 200 lb (90.7 kg)     Lose weight by decreasing caloric intake and maintain activity.         This is a list of the screening recommended for you and due dates:  Health Maintenance  Topic Date Due   Colon Cancer Screening  08/17/2020   COVID-19 Vaccine (6 - Pfizer series) 11/24/2020   Medicare Annual Wellness Visit  11/14/2022   Tetanus Vaccine  08/28/2027   Pneumonia Vaccine  Completed   Flu Shot  Completed   HPV Vaccine  Aged Out   Zoster (Shingles) Vaccine  Discontinued    Advanced directives: In Chart   Conditions/risks identified: Aim for 30 minutes of exercise or brisk walking, 6-8 glasses of water, and 5 servings of fruits and vegetables each day.   Next appointment: Follow up in one year for your annual wellness visit.   Preventive Care 4 Years and Older, Male  Preventive care refers  to lifestyle choices and visits with your health care provider that can promote health and wellness. What does preventive care include? A yearly physical exam. This is also called an annual well check. Dental exams once or twice a year. Routine eye exams.  Ask your health care provider how often you should have your eyes checked. Personal lifestyle choices, including: Daily care of your teeth and gums. Regular physical activity. Eating a healthy diet. Avoiding tobacco and drug use. Limiting alcohol use. Practicing safe sex. Taking low doses of aspirin every day. Taking vitamin and mineral supplements as recommended by your health care provider. What happens during an annual well check? The services and screenings done by your health care provider during your annual well check will depend on your age, overall health, lifestyle risk factors, and family history of disease. Counseling  Your health care provider may ask you questions about your: Alcohol use. Tobacco use. Drug use. Emotional well-being. Home and relationship well-being. Sexual activity. Eating habits. History of falls. Memory and ability to understand (cognition). Work and work Statistician. Screening  You may have the following tests or measurements: Height, weight, and BMI. Blood pressure. Lipid and cholesterol levels. These may be checked every 5 years, or more frequently if you are over 61 years old. Skin check. Lung cancer screening. You may have this screening every year starting at age 44 if you have a 30-pack-year history of smoking and currently smoke or have quit within the past 15 years. Fecal occult blood test (FOBT) of the stool. You may have this test every year starting at age 35. Flexible sigmoidoscopy or colonoscopy. You may have a sigmoidoscopy every 5 years or a colonoscopy every 10 years starting at age 63. Prostate cancer screening. Recommendations will vary depending on your family history and other risks. Hepatitis C blood test. Hepatitis B blood test. Sexually transmitted disease (STD) testing. Diabetes screening. This is done by checking your blood sugar (glucose) after you have not eaten for a while (fasting). You may have this done every 1-3  years. Abdominal aortic aneurysm (AAA) screening. You may need this if you are a current or former smoker. Osteoporosis. You may be screened starting at age 51 if you are at high risk. Talk with your health care provider about your test results, treatment options, and if necessary, the need for more tests. Vaccines  Your health care provider may recommend certain vaccines, such as: Influenza vaccine. This is recommended every year. Tetanus, diphtheria, and acellular pertussis (Tdap, Td) vaccine. You may need a Td booster every 10 years. Zoster vaccine. You may need this after age 32. Pneumococcal 13-valent conjugate (PCV13) vaccine. One dose is recommended after age 73. Pneumococcal polysaccharide (PPSV23) vaccine. One dose is recommended after age 61. Talk to your health care provider about which screenings and vaccines you need and how often you need them. This information is not intended to replace advice given to you by your health care provider. Make sure you discuss any questions you have with your health care provider. Document Released: 01/27/2015 Document Revised: 09/20/2015 Document Reviewed: 11/01/2014 Elsevier Interactive Patient Education  2017 [employer] Prevention in the Home Falls can cause injuries. They can happen to people of all ages. There are many things you can do to make your home safe and to help prevent falls. What can I do on the outside of my home? Regularly fix the edges of walkways and driveways and fix any cracks. Remove anything that might make you  trip as you walk through a door, such as a raised step or threshold. Trim any bushes or trees on the path to your home. Use bright outdoor lighting. Clear any walking paths of anything that might make someone trip, such as rocks or tools. Regularly check to see if handrails are loose or broken. Make sure that both sides of any steps have handrails. Any raised decks and porches should have guardrails on the  edges. Have any leaves, snow, or ice cleared regularly. Use sand or salt on walking paths during winter. Clean up any spills in your garage right away. This includes oil or grease spills. What can I do in the bathroom? Use night lights. Install grab bars by the toilet and in the tub and shower. Do not use towel bars as grab bars. Use non-skid mats or decals in the tub or shower. If you need to sit down in the shower, use a plastic, non-slip stool. Keep the floor dry. Clean up any water that spills on the floor as soon as it happens. Remove soap buildup in the tub or shower regularly. Attach bath mats securely with double-sided non-slip rug tape. Do not have throw rugs and other things on the floor that can make you trip. What can I do in the bedroom? Use night lights. Make sure that you have a light by your bed that is easy to reach. Do not use any sheets or blankets that are too big for your bed. They should not hang down onto the floor. Have a firm chair that has side arms. You can use this for support while you get dressed. Do not have throw rugs and other things on the floor that can make you trip. What can I do in the kitchen? Clean up any spills right away. Avoid walking on wet floors. Keep items that you use a lot in easy-to-reach places. If you need to reach something above you, use a strong step stool that has a grab bar. Keep electrical cords out of the way. Do not use floor polish or wax that makes floors slippery. If you must use wax, use non-skid floor wax. Do not have throw rugs and other things on the floor that can make you trip. What can I do with my stairs? Do not leave any items on the stairs. Make sure that there are handrails on both sides of the stairs and use them. Fix handrails that are broken or loose. Make sure that handrails are as long as the stairways. Check any carpeting to make sure that it is firmly attached to the stairs. Fix any carpet that is loose or  worn. Avoid having throw rugs at the top or bottom of the stairs. If you do have throw rugs, attach them to the floor with carpet tape. Make sure that you have a light switch at the top of the stairs and the bottom of the stairs. If you do not have them, ask someone to add them for you. What else can I do to help prevent falls? Wear shoes that: Do not have high heels. Have rubber bottoms. Are comfortable and fit you well. Are closed at the toe. Do not wear sandals. If you use a stepladder: Make sure that it is fully opened. Do not climb a closed stepladder. Make sure that both sides of the stepladder are locked into place. Ask someone to hold it for you, if possible. Clearly mark and make sure that you can see: Any grab bars  or handrails. First and last steps. Where the edge of each step is. Use tools that help you move around (mobility aids) if they are needed. These include: Canes. Walkers. Scooters. Crutches. Turn on the lights when you go into a dark area. Replace any light bulbs as soon as they burn out. Set up your furniture so you have a clear path. Avoid moving your furniture around. If any of your floors are uneven, fix them. If there are any pets around you, be aware of where they are. Review your medicines with your doctor. Some medicines can make you feel dizzy. This can increase your chance of falling. Ask your doctor what other things that you can do to help prevent falls. This information is not intended to replace advice given to you by your health care provider. Make sure you discuss any questions you have with your health care provider. Document Released: 10/27/2008 Document Revised: 06/08/2015 Document Reviewed: 02/04/2014 Elsevier Interactive Patient Education  2017 [employer].

## 2021-11-14 DIAGNOSIS — Z23 Encounter for immunization: Secondary | ICD-10-CM | POA: Diagnosis not present

## 2021-11-16 ENCOUNTER — Ambulatory Visit (INDEPENDENT_AMBULATORY_CARE_PROVIDER_SITE_OTHER): Payer: Medicare Other

## 2021-11-16 DIAGNOSIS — I429 Cardiomyopathy, unspecified: Secondary | ICD-10-CM

## 2021-11-16 LAB — CUP PACEART REMOTE DEVICE CHECK
Battery Remaining Longevity: 108 mo
Battery Remaining Percentage: 100 %
Brady Statistic RA Percent Paced: 0 %
Brady Statistic RV Percent Paced: 95 %
Date Time Interrogation Session: 20231103013400
HighPow Impedance: 40 Ohm
Implantable Lead Connection Status: 753985
Implantable Lead Connection Status: 753985
Implantable Lead Connection Status: 753985
Implantable Lead Implant Date: 20020527
Implantable Lead Implant Date: 20020527
Implantable Lead Implant Date: 20131212
Implantable Lead Location: 753858
Implantable Lead Location: 753859
Implantable Lead Location: 753860
Implantable Lead Model: 148
Implantable Lead Model: 4194
Implantable Lead Model: 6940
Implantable Lead Serial Number: 120244
Implantable Pulse Generator Implant Date: 20220131
Lead Channel Impedance Value: 637 Ohm
Lead Channel Impedance Value: 647 Ohm
Lead Channel Setting Pacing Amplitude: 2.5 V
Lead Channel Setting Pacing Amplitude: 2.5 V
Lead Channel Setting Pacing Pulse Width: 0.4 ms
Lead Channel Setting Pacing Pulse Width: 1 ms
Lead Channel Setting Sensing Sensitivity: 0.6 mV
Lead Channel Setting Sensing Sensitivity: 1 mV
Pulse Gen Serial Number: 149562

## 2021-11-21 ENCOUNTER — Ambulatory Visit: Payer: Medicare Other | Admitting: Family Medicine

## 2021-11-22 ENCOUNTER — Other Ambulatory Visit: Payer: Self-pay | Admitting: Cardiology

## 2021-11-22 ENCOUNTER — Encounter: Payer: Self-pay | Admitting: Family Medicine

## 2021-11-22 ENCOUNTER — Ambulatory Visit (INDEPENDENT_AMBULATORY_CARE_PROVIDER_SITE_OTHER): Payer: Medicare Other | Admitting: Family Medicine

## 2021-11-22 VITALS — BP 100/58 | HR 59 | Temp 98.1°F | Resp 17 | Ht 70.0 in | Wt 184.2 lb

## 2021-11-22 DIAGNOSIS — E663 Overweight: Secondary | ICD-10-CM | POA: Diagnosis not present

## 2021-11-22 DIAGNOSIS — E7800 Pure hypercholesterolemia, unspecified: Secondary | ICD-10-CM | POA: Diagnosis not present

## 2021-11-22 DIAGNOSIS — I255 Ischemic cardiomyopathy: Secondary | ICD-10-CM

## 2021-11-22 DIAGNOSIS — I10 Essential (primary) hypertension: Secondary | ICD-10-CM | POA: Diagnosis not present

## 2021-11-22 NOTE — Assessment & Plan Note (Signed)
Chronic problem.  On Crestor 66m daily w/o difficulty.  Check labs.  Adjust meds prn

## 2021-11-22 NOTE — Progress Notes (Signed)
   Subjective:    Patient ID: [name], male    DOB: 28-Apr-1940, 81 y.o.   MRN: [id number]  HPI HTN- chronic problem, on Coreg 3.19m BID, Lasix 817mdaily, Entresto 24/26105mID w/ excellent control.  Home BP machine corresponds w/ office reading.  No CP, SOB above baseline, Has, visual changes, edema.  Hyperlipidemia- chronic problem, on Crestor 37m37mily.  Denies abd pain, N/V  Overweight- since starting Jardiance pt has lost weight.  Since I last saw him he is down 5 lbs.  Pants have decreased from 44 --> 36".  Pt is very pleased but fearful of dropping too much weight.  Wants to know where it should stop   Review of Systems For ROS see HPI     Objective:   Physical Exam Vitals reviewed.  Constitutional:      General: He is not in acute distress.    Appearance: Normal appearance. He is well-developed. He is not ill-appearing.  HENT:     Head: Normocephalic and atraumatic.  Eyes:     Extraocular Movements: Extraocular movements intact.     Conjunctiva/sclera: Conjunctivae normal.     Pupils: Pupils are equal, round, and reactive to light.  Neck:     Thyroid: No thyromegaly.  Cardiovascular:     Rate and Rhythm: Normal rate and regular rhythm.     Pulses: Normal pulses.     Heart sounds: Normal heart sounds. No murmur heard. Pulmonary:     Effort: Pulmonary effort is normal. No respiratory distress.     Breath sounds: Normal breath sounds.  Abdominal:     General: Bowel sounds are normal. There is no distension.     Palpations: Abdomen is soft.  Musculoskeletal:     Cervical back: Normal range of motion and neck supple.     Right lower leg: No edema.     Left lower leg: No edema.  Lymphadenopathy:     Cervical: No cervical adenopathy.  Skin:    General: Skin is warm and dry.  Neurological:     General: No focal deficit present.     Mental Status: He is alert and oriented to person, place, and time.     Cranial Nerves: No cranial nerve deficit.  Psychiatric:         Mood and Affect: Mood normal.        Behavior: Behavior normal.           Assessment & Plan:

## 2021-11-22 NOTE — Assessment & Plan Note (Signed)
Chronic problem.  Excellent control today on Coreg 3.144m BID, Lasix 856mdaily, and Entresto 24/2686mID.  His BP is on the low side but he is asymptomatic at this time so no medication changes are needed.  Check labs due to diuretic and ARB but no anticipated med changes.  Will follow.

## 2021-11-22 NOTE — Assessment & Plan Note (Signed)
Pt has dropped considerable weight since starting Jardiance over a year ago.  His waist has decreased from a 44 --> 36".  He is pleased w/ the results but doesn't want to drop too low.  Told him I don't want him under 175 lbs.  Pt expressed understanding and is in agreement w/ plan.

## 2021-11-22 NOTE — Patient Instructions (Signed)
Follow up in 6 months to recheck BP and cholesterol We'll notify you of your lab results and make any changes if needed I wouldn't want to see your weight drop below 175 lbs- if that happens, let me know Call with any questions or concerns Stay Safe!  Stay Healthy! Happy Holidays!!!

## 2021-11-23 ENCOUNTER — Telehealth: Payer: Self-pay

## 2021-11-23 LAB — CBC WITH DIFFERENTIAL/PLATELET
Basophils Absolute: 0.1 10*3/uL (ref 0.0–0.1)
Basophils Relative: 1.3 % (ref 0.0–3.0)
Eosinophils Absolute: 0.2 10*3/uL (ref 0.0–0.7)
Eosinophils Relative: 3 % (ref 0.0–5.0)
HCT: 46.1 % (ref 39.0–52.0)
Hemoglobin: 15.5 g/dL (ref 13.0–17.0)
Lymphocytes Relative: 23.6 % (ref 12.0–46.0)
Lymphs Abs: 1.8 10*3/uL (ref 0.7–4.0)
MCHC: 33.6 g/dL (ref 30.0–36.0)
MCV: 104.8 fl — ABNORMAL HIGH (ref 78.0–100.0)
Monocytes Absolute: 0.5 10*3/uL (ref 0.1–1.0)
Monocytes Relative: 5.9 % (ref 3.0–12.0)
Neutro Abs: 5.1 10*3/uL (ref 1.4–7.7)
Neutrophils Relative %: 66.2 % (ref 43.0–77.0)
Platelets: 287 10*3/uL (ref 150.0–400.0)
RBC: 4.4 Mil/uL (ref 4.22–5.81)
RDW: 13.8 % (ref 11.5–15.5)
WBC: 7.7 10*3/uL (ref 4.0–10.5)

## 2021-11-23 LAB — HEPATIC FUNCTION PANEL
ALT: 23 U/L (ref 0–53)
AST: 22 U/L (ref 0–37)
Albumin: 3.9 g/dL (ref 3.5–5.2)
Alkaline Phosphatase: 56 U/L (ref 39–117)
Bilirubin, Direct: 0.2 mg/dL (ref 0.0–0.3)
Total Bilirubin: 0.7 mg/dL (ref 0.2–1.2)
Total Protein: 6.3 g/dL (ref 6.0–8.3)

## 2021-11-23 LAB — LIPID PANEL
Cholesterol: 145 mg/dL (ref 0–200)
HDL: 37.3 mg/dL — ABNORMAL LOW (ref >39.00)
LDL Cholesterol: 89 mg/dL (ref 0–99)
NonHDL: 107.5
Total CHOL/HDL Ratio: 4
Triglycerides: 92 mg/dL (ref 0.0–149.0)
VLDL: 18.4 mg/dL (ref 0.0–40.0)

## 2021-11-23 LAB — BASIC METABOLIC PANEL
BUN: 17 mg/dL (ref 6–23)
CO2: 30 mEq/L (ref 19–32)
Calcium: 8.8 mg/dL (ref 8.4–10.5)
Chloride: 100 mEq/L (ref 96–112)
Creatinine, Ser: 1.04 mg/dL (ref 0.40–1.50)
GFR: 67.98 mL/min (ref >60.00)
Glucose, Bld: 111 mg/dL — ABNORMAL HIGH (ref 70–99)
Potassium: 3.6 mEq/L (ref 3.5–5.1)
Sodium: 140 mEq/L (ref 135–145)

## 2021-11-23 LAB — TSH: TSH: 0.86 u[IU]/mL (ref 0.35–5.50)

## 2021-11-23 NOTE — Telephone Encounter (Signed)
-----  Message from [name], MD sent at 11/23/2021  3:49 PM EST ----- Labs look great!  No changes at this time

## 2021-11-23 NOTE — Telephone Encounter (Signed)
Left pt a VM stating lab results

## 2021-11-26 NOTE — Progress Notes (Signed)
Remote ICD transmission.

## 2021-12-05 NOTE — Progress Notes (Signed)
HPI: FU history of coronary artery disease, mixed ischemic and nonischemic cardiomyopathy, history of ICD, paroxysmal atrial fibrillation, and ventricular tachycardia. Previous abdominal ultrasound in April 2006 showed no aneurysm. Last cardiac catheterization in October 2008 showed an occluded right coronary artery with left-to-right collateralization. Ejection fraction is 20%. There was no other coronary disease noted. ABIs in December of 2011 were normal. Previous carotid Dopplers in Sept 2014 showed 0-39% stenosis bilaterally. The patient has had upgrade of his device to CRT-D.  Amiodarone discontinued March 2019 because of potential pulmonary toxicity. Nuclear study April 2022 showed ejection fraction 16%, large scar with no significant ischemia. Pt underwent DCCV 05/10/20.  At follow-up office visit patient was in recurrent atrial fibrillation and now felt to be permanent. Echocardiogram May 2023 showed ejection fraction 30 to 93%, grade 1 diastolic dysfunction, severe left atrial enlargement, moderate to severe mitral regurgitation, moderate tricuspid regurgitation.  Chest CT September 2023 showed fibrosis in a pattern suggestive of UIP.  Since last seen, he does have dyspnea on exertion that he attributes to pulmonary fibrosis.  No orthopnea, PND, pedal edema, chest pain or syncope.  He has lost significant amounts of weight as he has decreased appetite with esbriet.  Current Outpatient Medications  Medication Sig Dispense Refill   antiseptic oral rinse (BIOTENE) LIQD 15 mLs by Mouth Rinse route in the morning and at bedtime.     BREZTRI AEROSPHERE 160-9-4.8 MCG/ACT AERO INHALE 2 PUFFS INTO THE LUNGS IN THE MORNING AND AT BEDTIME 10.7 g 6   carvedilol (COREG) 3.125 MG tablet TAKE 1 TABLET(3.125 MG) BY MOUTH TWICE DAILY WITH A MEAL 180 tablet 3   cholecalciferol (VITAMIN D) 1000 units tablet Take 1,000 Units by mouth every evening.     clotrimazole (MYCELEX) 10 MG troche Dissolve, swish and  swallow one tablet by mouth 5 times daily for 14 days. 70 tablet 1   empagliflozin (JARDIANCE) 10 MG TABS tablet Take 1 tablet (10 mg total) by mouth daily before breakfast. 90 tablet 3   eplerenone (INSPRA) 25 MG tablet TAKE 1 TABLET(25 MG) BY MOUTH DAILY 90 tablet 3   furosemide (LASIX) 40 MG tablet TAKE 2 TABLETS BY MOUTH EVERY DAY 90 tablet 3   Ginger, Zingiber officinalis, (GINGER ROOT) 550 MG CAPS Take 1 capsule by mouth daily.     ipratropium (ATROVENT) 0.06 % nasal spray Place 2 sprays into both nostrils 3 (three) times daily. 15 mL 1   levalbuterol (XOPENEX HFA) 45 MCG/ACT inhaler INHALE 2 PUFFS INTO THE LUNGS EVERY 4 HOURS AS NEEDED FOR WHEEZING 15 g 6   loratadine (CLARITIN) 10 MG tablet Take 10 mg by mouth in the morning.     Magnesium 250 MG TABS Take 250 mg by mouth every evening.     mexiletine (MEXITIL) 150 MG capsule TAKE 2 CAPSULES(300 MG) BY MOUTH TWICE DAILY 360 capsule 2   Multiple Vitamin (MULTIVITAMIN WITH MINERALS) TABS Take 1 tablet by mouth every evening.     nitroGLYCERIN (NITROSTAT) 0.4 MG SL tablet DISSOLVE 1 TABLET UNDER THE TONGUE EVERY 5 MINUTES AS NEEDED FOR CHEST PAIN 25 tablet 5   Olopatadine HCl (PATADAY OP) Apply 1 drop to eye daily.     Pirfenidone (ESBRIET) 267 MG TABS Take 2 tablets (534 mg total) by mouth with breakfast, with lunch, and with evening meal. 540 tablet 1   polyvinyl alcohol-povidone (HYPOTEARS) 1.4-0.6 % ophthalmic solution Place 1-2 drops into both eyes in the morning and at bedtime.  potassium chloride SA (KLOR-CON M) 20 MEQ tablet Take 1 tablet (20 mEq total) by mouth daily with lunch. 90 tablet 3   rivaroxaban (XARELTO) 20 MG TABS tablet TAKE 1 TABLET(20 MG) BY MOUTH DAILY WITH SUPPER 90 tablet 3   rosuvastatin (CRESTOR) 40 MG tablet TAKE 1 TABLET(40 MG) BY MOUTH EVERY EVENING 90 tablet 3   sacubitril-valsartan (ENTRESTO) 24-26 MG Take 1 tablet by mouth 2 (two) times daily. 180 tablet 3   Simethicone (GAS-X PO) Take by mouth as  directed.     No current facility-administered medications for this visit.     Past Medical History:  Diagnosis Date   Adenomatous colon polyp 2009; 06/2012   Colonoscopy 07/2007---Dr. Barron Schmid GI--Repeat 06/2012 showed one tubular adenoma w/out high grade dysplasia.   AICD (automatic cardioverter/defibrillator) present    Chemical engineer   Atrial fibrillation ([place])    Bilateral sensorineural hearing loss    BPH (benign prostatic hypertrophy)    PSAs ok (followed by urologist): no LUTS   CAD (coronary artery disease)    PTCA RCA 1997 (Inf/post MI);   CHF (congestive heart failure) ([place])    Chronic constipation    Chronic rhinitis    ?vasomotor (Dr. Erik Obey)   COPD (chronic obstructive pulmonary disease) ([place])    Diverticulosis of colon 2005   Noted on Colonoscopies in 2005 and 2009 and on CT 2010   Gallstones 06/2014   No evidence of cholecystitis on abd u/s 06/2014   GERD (gastroesophageal reflux disease)    Hepatic steatosis 2010; 2016   Noted on CT abd 2010 and on u/s abd 04/2009 (transaminasemia) and u/s 06/2014.   Hyperlipidemia    Hypertension    Ischemic cardiomyopathy    Microscopic hematuria    Cysto neg 02/2009 except for BPH-likely has microhem from his renal stone dz   Nephrolithiasis    No distal stones on CT 2010; stable 8 mm RLP stone, 91m LMP stone 10/2013 plain film.  Abd u/s 06/2014: 9 mm nonobstructing lower pole right renal stone.  03/2015 urol: stable nonobstructing renal calc by CT 12/2014 and KUB 03/2015.   Obstructive sleep apnea    no cpap uses since weight loss of 50 pounds   Presence of permanent cardiac pacemaker    [employer]   Squamous cell carcinoma of back    Ventricular tachycardia ([facility]     Past Surgical History:  Procedure Laterality Date   ATRIAL ABLATION SURGERY     flutter ablation 2002   BREAST SURGERY Left 1990   "Tissue removed from breast"   CSeligmanN/A 05/10/2020   Procedure:  CARDIOVERSION;  Surgeon: CSanda Klein MD;  [facility];  Service: Cardiovascular;  Laterality: N/A;   carotid doppler  09/25/12   No signif stenosis   COLONOSCOPY N/A 07/13/2012   Diverticula ascending colon.  Polyp x 1-recall 5 yrs (Dr. MWatt Climes Procedure: COLONOSCOPY;  Surgeon: MJeryl Columbia MD;  Location: [facility];  Service: Endoscopy;  Laterality: N/A; polypectomy x 1    COLONOSCOPY WITH PROPOFOL N/A 08/18/2015   Procedure: COLONOSCOPY WITH PROPOFOL;  Surgeon: KMauri Pole MD;  Location: [facility];  Service: Endoscopy;  Laterality: N/A;   ESOPHAGOGASTRODUODENOSCOPY N/A 07/13/2012   Procedure: ESOPHAGOGASTRODUODENOSCOPY (EGD);  Surgeon: MJeryl Columbia MD;  [facility];  Service: Endoscopy;  Laterality: N/A;  NORMAL   EXTRACORPOREAL SHOCK WAVE LITHOTRIPSY     HEMORRHOID BANDING     ICD     a)  Guidant Contak R443- Virl Axe, MD 07/25/06 (second device) b) Medtronic CRT-D   ICD GENERATOR CHANGEOUT N/A 02/14/2020   Procedure: ICD GENERATOR CHANGEOUT;  Surgeon: Deboraha Sprang, MD;  Location: [place] CV LAB;  Service: Cardiovascular;  Laterality: N/A;   INGUINAL HERNIA REPAIR Bilateral 1975   LEAD REVISION Bilateral 12/26/2011   Procedure: LEAD REVISION;  Surgeon: Evans Lance, MD;  Location: [facility] CATH LAB;  Service: Cardiovascular;  Laterality: Bilateral;   PFTs  02/2015   Mod restriction (interstitial), mod diffusion defect (Dr. Annamaria Boots)   precancer area keratsis removed  05/2015    leftf [place] and left arm and right arm and back    Social History   Socioeconomic History   Marital status: Married    Spouse name: Not on file   Number of children: 3   Years of education: 16   Highest education level: Bachelor's degree (e.g., BA, AB, BS)  Occupational History   Occupation: Retired    Fish farm manager: RETIRED    Comment: Nurse, children's  Tobacco Use   Smoking status: Former    Packs/day: 2.00    Years: 15.00    Total pack years: 30.00    Types:  Cigarettes    Quit date: 01/15/1980    Years since quitting: 41.9   Smokeless tobacco: Never  Vaping Use   Vaping Use: Never used  Substance and Sexual Activity   Alcohol use: No    Alcohol/week: 0.0 standard drinks of alcohol   Drug use: No   Sexual activity: Yes  Other Topics Concern   Not on file  Social History Narrative   Married.   Hx of cigarettes: quit in the 1980s.  No alc/drugs.   Social Determinants of Health   Financial Resource Strain: Low Risk  (11/13/2021)   Overall Financial Resource Strain (CARDIA)    Difficulty of Paying Living Expenses: Not hard at all  Food Insecurity: No Food Insecurity (11/13/2021)   Hunger Vital Sign    Worried About Running Out of Food in the Last Year: Never true    Ran Out of Food in the Last Year: Never true  Transportation Needs: No Transportation Needs (11/13/2021)   PRAPARE - Hydrologist (Medical): No    Lack of Transportation (Non-Medical): No  Physical Activity: Insufficiently Active (11/13/2021)   Exercise Vital Sign    Days of Exercise per Week: 5 days    Minutes of Exercise per Session: 10 min  Stress: No Stress Concern Present (11/13/2021)   [place]    Feeling of Stress : Not at all  Social Connections: Moderately Isolated (11/13/2021)   Social Connection and Isolation Panel [NHANES]    Frequency of Communication with Friends and Family: More than three times a week    Frequency of Social Gatherings with Friends and Family: More than three times a week    Attends Religious Services: Never    Marine scientist or Organizations: No    Attends Archivist Meetings: Never    Marital Status: Married  Human resources officer Violence: Not At Risk (11/13/2021)   Humiliation, Afraid, Rape, and Kick questionnaire    Fear of Current or Ex-Partner: No    Emotionally Abused: No    Physically Abused: No    Sexually Abused: No     Family History  Problem Relation Age of Onset   Heart disease Mother    Other Mother  colon surgery for fistula   Gallstones Mother    Breast cancer Maternal Grandmother    Coronary artery disease Father        family hx of   Heart attack Father        4 stents/ pacemaker   Nephrolithiasis Father    Hypertension Sister    Goiter Paternal Grandmother    Nephrolithiasis Son    Colon cancer Neg Hx     ROS: no fevers or chills, productive cough, hemoptysis, dysphasia, odynophagia, melena, hematochezia, dysuria, hematuria, rash, seizure activity, orthopnea, PND, pedal edema, claudication. Remaining systems are negative.  Physical Exam: Well-developed well-nourished in no acute distress.  Skin is warm and dry.  HEENT is normal.  Neck is supple.  Chest is clear to auscultation with normal expansion.  Cardiovascular exam is regular rate and rhythm.  Abdominal exam nontender or distended. No masses palpated. Extremities show no edema. neuro grossly intact  ECG-ventricular pacing with probable underlying atrial fibrillation.  Personally reviewed  A/P  1 permanent atrial fibrillation-continue carvedilol and Xarelto.  2 mixed ischemic/nonischemic cardiomyopathy-continue Entresto and carvedilol.  3 mitral regurgitation-we will plan follow-up echocardiogram May 2024.  Patient does not volume overloaded on examination.  We can consider referral for MitraClip in the future if he develops worsening CHF.  4 coronary artery disease-continue statin.  He is not on aspirin given need for anticoagulation.  5 chronic systolic congestive heart failure-he is euvolemic. Continue Jardiance.  He has lost significant amounts of weight likely secondary to his pulmonary medications and decreased appetite.  I will decrease Lasix to 40 mg daily with an additional 40 mg as needed for worsening lower extremity edema or shortness of breath.  Continue spironolactone.  Check potassium and renal  function as well as BNP in 2 weeks.  6 hypertension-blood pressure controlled.  Continue present medical regimen.  7 history of ventricular tachycardia-patient is on beta-blockade and mexiletine.  Amiodarone was discontinued previously due to the potential for lung toxicity.  8 hyperlipidemia-continue statin.    9 biventricular ICD-managed by electrophysiology.  Kirk Ruths, MD

## 2021-12-07 ENCOUNTER — Other Ambulatory Visit: Payer: Self-pay | Admitting: Cardiology

## 2021-12-07 DIAGNOSIS — I4821 Permanent atrial fibrillation: Secondary | ICD-10-CM

## 2021-12-11 DIAGNOSIS — Z85828 Personal history of other malignant neoplasm of skin: Secondary | ICD-10-CM | POA: Diagnosis not present

## 2021-12-11 DIAGNOSIS — D1801 Hemangioma of skin and subcutaneous tissue: Secondary | ICD-10-CM | POA: Diagnosis not present

## 2021-12-11 DIAGNOSIS — L570 Actinic keratosis: Secondary | ICD-10-CM | POA: Diagnosis not present

## 2021-12-11 DIAGNOSIS — D485 Neoplasm of uncertain behavior of skin: Secondary | ICD-10-CM | POA: Diagnosis not present

## 2021-12-11 DIAGNOSIS — D045 Carcinoma in situ of skin of trunk: Secondary | ICD-10-CM | POA: Diagnosis not present

## 2021-12-11 DIAGNOSIS — L821 Other seborrheic keratosis: Secondary | ICD-10-CM | POA: Diagnosis not present

## 2021-12-11 DIAGNOSIS — M6740 Ganglion, unspecified site: Secondary | ICD-10-CM | POA: Diagnosis not present

## 2021-12-15 ENCOUNTER — Other Ambulatory Visit: Payer: Self-pay | Admitting: Adult Health

## 2021-12-19 ENCOUNTER — Ambulatory Visit: Payer: Medicare Other | Attending: Cardiology | Admitting: Cardiology

## 2021-12-19 ENCOUNTER — Encounter: Payer: Self-pay | Admitting: Cardiology

## 2021-12-19 DIAGNOSIS — I429 Cardiomyopathy, unspecified: Secondary | ICD-10-CM | POA: Diagnosis not present

## 2021-12-19 DIAGNOSIS — Z95810 Presence of automatic (implantable) cardiac defibrillator: Secondary | ICD-10-CM | POA: Diagnosis not present

## 2021-12-19 DIAGNOSIS — I5022 Chronic systolic (congestive) heart failure: Secondary | ICD-10-CM | POA: Diagnosis not present

## 2021-12-19 DIAGNOSIS — I4821 Permanent atrial fibrillation: Secondary | ICD-10-CM | POA: Diagnosis not present

## 2021-12-19 DIAGNOSIS — I4891 Unspecified atrial fibrillation: Secondary | ICD-10-CM | POA: Diagnosis not present

## 2021-12-19 DIAGNOSIS — I059 Rheumatic mitral valve disease, unspecified: Secondary | ICD-10-CM | POA: Insufficient documentation

## 2021-12-19 DIAGNOSIS — I255 Ischemic cardiomyopathy: Secondary | ICD-10-CM | POA: Insufficient documentation

## 2021-12-19 DIAGNOSIS — I480 Paroxysmal atrial fibrillation: Secondary | ICD-10-CM | POA: Diagnosis not present

## 2021-12-19 MED ORDER — FUROSEMIDE 40 MG PO TABS: 40.0000 mg | ORAL_TABLET | Freq: Every day | ORAL | 3 refills | Status: DC

## 2021-12-19 MED ORDER — POTASSIUM CHLORIDE CRYS ER 20 MEQ PO TBCR: 20.0000 meq | EXTENDED_RELEASE_TABLET | Freq: Every day | ORAL | 3 refills | Status: DC

## 2021-12-19 MED ORDER — ROSUVASTATIN CALCIUM 40 MG PO TABS: ORAL_TABLET | ORAL | 3 refills | Status: DC

## 2021-12-19 MED ORDER — RIVAROXABAN 20 MG PO TABS: ORAL_TABLET | ORAL | 3 refills | Status: DC

## 2021-12-19 MED ORDER — EPLERENONE 25 MG PO TABS: ORAL_TABLET | ORAL | 3 refills | Status: DC

## 2021-12-19 MED ORDER — ENTRESTO 24-26 MG PO TABS: 1.0000 | ORAL_TABLET | Freq: Two times a day (BID) | ORAL | 3 refills | Status: DC

## 2021-12-19 MED ORDER — CARVEDILOL 3.125 MG PO TABS: ORAL_TABLET | ORAL | 3 refills | Status: DC

## 2021-12-19 MED ORDER — EMPAGLIFLOZIN 10 MG PO TABS: 10.0000 mg | ORAL_TABLET | Freq: Every day | ORAL | 3 refills | Status: DC

## 2021-12-19 MED ORDER — MEXILETINE HCL 150 MG PO CAPS: 300.0000 mg | ORAL_CAPSULE | Freq: Two times a day (BID) | ORAL | 2 refills | Status: DC

## 2021-12-19 NOTE — Patient Instructions (Signed)
Medication Instructions:   Decrease furosemide to 40 mg once daily- 1 tablet once daily  *If you need a refill on your cardiac medications before your next appointment, please call your pharmacy*   Lab Work:  Your physician recommends that you return for lab work in: 2 weeks-do not need to fast  [employer] on the 2 nd floor in [address] Hours- Mon-Thur 8 am-11:30 am and 1 pm - 4:30 pm             Fri-8am - 11 am   If you have labs (blood work) drawn today and your tests are completely normal, you will receive your results only by: [place] (if you have MyChart) OR A paper copy in the mail If you have any lab test that is abnormal or we need to change your treatment, we will call you to review the results.   Follow-Up: At [facility], you and your health needs are our priority.  As part of our continuing mission to provide you with exceptional heart care, we have created designated Provider Care Teams.  These Care Teams include your primary Cardiologist (physician) and Advanced Practice Providers (APPs -  Physician Assistants and Nurse Practitioners) who all work together to provide you with the care you need, when you need it.  We recommend signing up for the patient portal called "MyChart".  Sign up information is provided on this After Visit Summary.  MyChart is used to connect with patients for Virtual Visits (Telemedicine).  Patients are able to view lab/test results, encounter notes, upcoming appointments, etc.  Non-urgent messages can be sent to your provider as well.   To learn more about what you can do with MyChart, go to [email or web address].    Your next appointment:   6 month(s)  The format for your next appointment:   In Person  Provider:   Kirk Ruths, MD

## 2022-01-09 DIAGNOSIS — I5022 Chronic systolic (congestive) heart failure: Secondary | ICD-10-CM | POA: Diagnosis not present

## 2022-01-09 DIAGNOSIS — I429 Cardiomyopathy, unspecified: Secondary | ICD-10-CM | POA: Diagnosis not present

## 2022-01-10 LAB — BASIC METABOLIC PANEL
BUN/Creatinine Ratio: 13 (ref 10–24)
BUN: 13 mg/dL (ref 8–27)
CO2: 23 mmol/L (ref 20–29)
Calcium: 9.1 mg/dL (ref 8.6–10.2)
Chloride: 101 mmol/L (ref 96–106)
Creatinine, Ser: 1 mg/dL (ref 0.76–1.27)
Glucose: 103 mg/dL — ABNORMAL HIGH (ref 70–99)
Potassium: 4.3 mmol/L (ref 3.5–5.2)
Sodium: 139 mmol/L (ref 134–144)
eGFR: 76 mL/min/{1.73_m2} (ref >59)

## 2022-01-10 LAB — PRO B NATRIURETIC PEPTIDE: NT-Pro BNP: 715 pg/mL — ABNORMAL HIGH (ref 0–486)

## 2022-01-23 ENCOUNTER — Other Ambulatory Visit: Payer: Self-pay | Admitting: Cardiology

## 2022-01-23 DIAGNOSIS — I5022 Chronic systolic (congestive) heart failure: Secondary | ICD-10-CM

## 2022-02-04 DIAGNOSIS — N200 Calculus of kidney: Secondary | ICD-10-CM | POA: Diagnosis not present

## 2022-02-04 DIAGNOSIS — N401 Enlarged prostate with lower urinary tract symptoms: Secondary | ICD-10-CM | POA: Diagnosis not present

## 2022-02-04 DIAGNOSIS — R350 Frequency of micturition: Secondary | ICD-10-CM | POA: Diagnosis not present

## 2022-02-04 DIAGNOSIS — R3121 Asymptomatic microscopic hematuria: Secondary | ICD-10-CM | POA: Diagnosis not present

## 2022-02-06 ENCOUNTER — Telehealth: Payer: Self-pay | Admitting: Internal Medicine

## 2022-02-06 DIAGNOSIS — J84112 Idiopathic pulmonary fibrosis: Secondary | ICD-10-CM

## 2022-02-06 MED ORDER — PIRFENIDONE 267 MG PO TABS: 534.0000 mg | ORAL_TABLET | Freq: Three times a day (TID) | ORAL | 0 refills | Status: DC

## 2022-02-06 NOTE — Telephone Encounter (Signed)
Refill sent for ESBRIET to [facility] [employer] [facility]) for [place]: [phone] for 3 month supply only. Patient due for labs next month.  Dose: 534 mg three times daily  Last OV: 11/08/2021 Provider: Dr. Chase Caller  Next OV: 02/21/2022  LFTs on 11/22/2021 wnl  Jennye Boroughs Wilhemina Bonito, PharmD, MPH, BCPS, CPP Clinical Pharmacist (Rheumatology and Pulmonology)

## 2022-02-15 ENCOUNTER — Ambulatory Visit: Payer: Medicare Other

## 2022-02-15 DIAGNOSIS — I255 Ischemic cardiomyopathy: Secondary | ICD-10-CM | POA: Diagnosis not present

## 2022-02-15 LAB — CUP PACEART REMOTE DEVICE CHECK
Battery Remaining Longevity: 102 mo
Battery Remaining Percentage: 100 %
Brady Statistic RA Percent Paced: 0 %
Brady Statistic RV Percent Paced: 95 %
Date Time Interrogation Session: 20240202012100
HighPow Impedance: 44 Ohm
Implantable Lead Connection Status: 753985
Implantable Lead Connection Status: 753985
Implantable Lead Connection Status: 753985
Implantable Lead Implant Date: 20020527
Implantable Lead Implant Date: 20020527
Implantable Lead Implant Date: 20131212
Implantable Lead Location: 753858
Implantable Lead Location: 753859
Implantable Lead Location: 753860
Implantable Lead Model: 148
Implantable Lead Model: 4194
Implantable Lead Model: 6940
Implantable Lead Serial Number: 120244
Implantable Pulse Generator Implant Date: 20220131
Lead Channel Impedance Value: 658 Ohm
Lead Channel Impedance Value: 720 Ohm
Lead Channel Setting Pacing Amplitude: 2.5 V
Lead Channel Setting Pacing Amplitude: 2.5 V
Lead Channel Setting Pacing Pulse Width: 0.4 ms
Lead Channel Setting Pacing Pulse Width: 1 ms
Lead Channel Setting Sensing Sensitivity: 0.6 mV
Lead Channel Setting Sensing Sensitivity: 1 mV
Pulse Gen Serial Number: 149562

## 2022-02-21 ENCOUNTER — Encounter: Payer: Self-pay | Admitting: Internal Medicine

## 2022-02-21 ENCOUNTER — Ambulatory Visit (INDEPENDENT_AMBULATORY_CARE_PROVIDER_SITE_OTHER): Payer: Medicare Other | Admitting: Internal Medicine

## 2022-02-21 VITALS — BP 106/62 | HR 60 | Temp 97.8°F | Ht 70.0 in | Wt 183.4 lb

## 2022-02-21 DIAGNOSIS — R634 Abnormal weight loss: Secondary | ICD-10-CM

## 2022-02-21 DIAGNOSIS — J84112 Idiopathic pulmonary fibrosis: Secondary | ICD-10-CM

## 2022-02-21 DIAGNOSIS — T50905A Adverse effect of unspecified drugs, medicaments and biological substances, initial encounter: Secondary | ICD-10-CM

## 2022-02-21 DIAGNOSIS — R5383 Other fatigue: Secondary | ICD-10-CM

## 2022-02-21 DIAGNOSIS — J439 Emphysema, unspecified: Secondary | ICD-10-CM

## 2022-02-21 DIAGNOSIS — J8410 Pulmonary fibrosis, unspecified: Secondary | ICD-10-CM | POA: Diagnosis not present

## 2022-02-21 LAB — PULMONARY FUNCTION TEST
DL/VA % pred: 107 %
DL/VA: 4.18 ml/min/mmHg/L
DLCO cor % pred: 62 %
DLCO cor: 15.74 ml/min/mmHg
DLCO unc % pred: 62 %
DLCO unc: 15.74 ml/min/mmHg
FEF 25-75 Pre: 3.27 L/sec
FEF2575-%Pred-Pre: 158 %
FEV1-%Pred-Pre: 70 %
FEV1-Pre: 2.12 L
FEV1FVC-%Pred-Pre: 120 %
FEV6-%Pred-Pre: 62 %
FEV6-Pre: 2.46 L
FEV6FVC-%Pred-Pre: 106 %
FVC-%Pred-Pre: 58 %
FVC-Pre: 2.46 L
Pre FEV1/FVC ratio: 86 %
Pre FEV6/FVC Ratio: 100 %

## 2022-02-21 MED ORDER — AEROCHAMBER MV MISC: 0 refills | Status: DC

## 2022-02-21 NOTE — Progress Notes (Signed)
Spirometry/DLCO performed today. 

## 2022-02-21 NOTE — Patient Instructions (Addendum)
Pulmnary Fibrosis with emphysema ILD (interstitial lung disease) (HCC) IPF (idiopathic pulmonary fibrosis) (HCC)  - IPF is  stable x 1 year on CT on  low dose ebreit though progressed in 3-4 years - -Currently on Esbriet 2 pills 3 times daily   -  being capped because baseline mexilitine can increase esbriet levels  - esbiret contributing to fatigue   Plan - check LFT 02/21/2022 - continue esbriet 2 pills x 3 times daily  - controlling GI symptoms with omeprazole, miralax, ginger regimen as needed - Continue pulmonary rehab  -do not recommend clinical trial for you right now due to heart issues   - glad you are aligned on same - spiro/dlco in 4 months   Fatigue  - fatigue is due to esbriet but also due to heart issues esp pacer might not be responding to exertion  Plan  - monitor  -talk to cardiology  Chronic obstructive pulmonary disease, unspecified COPD type ([place])  - not in flare up  Plan  - cotninue breztri daily  - get space device 02/21/2022 - albuterol as needed   GERD - appears omeprazole helping you bu t you are worried about kidney side effect  Plan  - stop omeprazole  Weight loss- drug induced Low appetite   -ongoing weight loss  due to esbriet and jardiance  but seems stabilized - currently 183# with BMI 26  Plan  -monitor; ok to tolerate weight loss till 170- 175# - do calorie count using an app like MYFITNESSAPL - can drink protein drink such as CORE POWER, MUSCLE MILK, ENSURE,  - also increase intake EGGS, NUT - might have to hold esbriet if weight loss continues < 170#   Followup - 4 months in 30 min visit after PFT;

## 2022-02-21 NOTE — Patient Instructions (Signed)
Spirometry/DLCO performed today. 

## 2022-02-21 NOTE — Progress Notes (Signed)
OV 10/05/2020 -transfer of care to the ILD center with Dr. Chase Caller.  Patient being referred by Dr. Baltazar Apo  Subjective:  Patient ID: [name], male , DOB: 1940/01/20 , age 82 y.o. , MRN: [id number] , ADDRESS: [place] [id number] PCP [name], MD Patient Care Team: [name], MD as PCP - General (Family Medicine) Festus Aloe, MD as Consulting Physician (Urology) Michael Boston, MD as Consulting Physician (General Surgery) Stanford Breed Denice Bors, MD as Consulting Physician (Cardiology) Jodi Marble, MD as Consulting Physician (Otolaryngology) Deboraha Sprang, MD as Consulting Physician (Cardiology) Luberta Mutter, MD as Consulting Physician (Ophthalmology) Lamonte Sakai Rose Fillers, MD as Consulting Physician (Pulmonary Disease) Mauri Pole, MD as Consulting Physician (Gastroenterology) [name], [facility] as Pharmacist (Pharmacist)  This Provider for this visit: Treatment Team:  Attending Provider: Brand Males, MD    10/05/2020 -   Chief Complaint  Patient presents with   Follow-up    Pt states that he does have complaints of SOB that can happen at any time but is worse with exertion.     HPI [name] 82 y.o. -referred by Dr. Baltazar Apo.  History is provided by the patient and also review of the medical records.  Patient tells me he is aware that he has ILD.  He has a significant symptom burden.  He says he is recently been told his ILD is gotten worse.  He was on amiodarone for many years.  Then approximately 18 months ago he stopped it because he got worried about the drug.  Dr. Lamonte Sakai notes indicate that patient has combined emphysema and interstitial lung disease.  His pulmonary function test profile shows 20% decline in FVC in the last 5 years or so with most of it coming in the last 2 years.  High-resolution CT chest only shows some decline.  High-resolution CT chest personally visualized by me and is probable  UIP.  His serologies are negative.  He is a former smoker having quit over 40 years ago.  He is Caucasian and his age is greater than 74.  Male gender.  He definitely has progressive symptoms.  He really wants a good symptom control.  He also wants understand his disease.  Today most of the focus was spent on helping him understand interstitial lung disease.  I explicitly told him that I would not be able to address symptom burden today.  He will also switch his comprehensive pulmonary care to see Dr. Chase Caller myself.  He has had an MI in the past.  His atrial fibrillation is on Eliquis.  He is no longer on amiodarone.  His walking desaturation test did not show desaturation more than 3 points.   CT Chest data Sept 2022  Narrative & Impression  CLINICAL DATA:  82 year old male with history of COPD. Evaluate for interstitial lung disease.   EXAM: CT CHEST WITHOUT CONTRAST   TECHNIQUE: Multidetector CT imaging of the chest was performed following the standard protocol without intravenous contrast. High resolution imaging of the lungs, as well as inspiratory and expiratory imaging, was performed.   COMPARISON:  Chest CT 02/17/2018.   FINDINGS: Cardiovascular: Heart size is normal. There is no significant pericardial fluid, thickening or pericardial calcification. There is aortic atherosclerosis, as well as atherosclerosis of the great vessels of the mediastinum and the coronary arteries, including calcified atherosclerotic plaque in the left main, left anterior descending, left circumflex and right coronary arteries. Calcifications of the aortic valve.  Curvilinear calcifications associated with the myocardium of the inferior wall of the left ventricle in the mid ventricle and base, likely sequela of prior left circumflex coronary artery territory myocardial infarction(s). Left-sided biventricular pacemaker/AICD with lead tips terminating in the right atrial appendage, right  ventricular apex, and overlying the lateral wall the left ventricle via the coronary sinus and coronary veins.   Mediastinum/Nodes: No pathologically enlarged mediastinal or hilar lymph nodes. Please note that accurate exclusion of hilar adenopathy is limited on noncontrast CT scans. Esophagus is unremarkable in appearance. No axillary lymphadenopathy.   Lungs/Pleura: High-resolution images again demonstrate some patchy areas of ground-glass attenuation, septal thickening, subpleural reticulation, mild traction bronchiectasis and peripheral bronchiolectasis in the lungs bilaterally. No definite honeycombing. These findings have a mild craniocaudal gradient. Inspiratory and expiratory imaging is unremarkable. No acute consolidative airspace disease. No pleural effusions. Chronic elevation of the right hemidiaphragm is again noted. No definite suspicious appearing pulmonary nodules or masses are noted.   Upper Abdomen: Low-attenuation lesions are noted in the left lobe of the liver, incompletely characterized on today's non-contrast CT examination, but similar to the prior study and measuring up to 1.3 cm in diameter in segment 2, statistically likely to represent cysts.   Musculoskeletal: There are no aggressive appearing lytic or blastic lesions noted in the visualized portions of the skeleton.   IMPRESSION: 1. The appearance of the lungs is compatible with interstitial lung disease, categorized as probable usual interstitial pneumonia (UIP) per current ATS guidelines. Findings appear mildly progressive compared to the prior study. 2. Aortic atherosclerosis, in addition to left main and 3 vessel coronary artery disease. Assessment for potential risk factor modification, dietary therapy or pharmacologic therapy may be warranted, if clinically indicated. 3. There are calcifications of the aortic valve. Echocardiographic correlation for evaluation of potential valvular dysfunction  may be warranted if clinically indicated.   Aortic Atherosclerosis (ICD10-I70.0).     Electronically Signed   By: Vinnie Langton M.D.   On: 09/22/2020 11:28   No results found   Results for DAESEAN, SCHLEY (MRN [id number]) as of 10/05/2020 11:51  Ref. Range 11/14/2015 11:52 09/27/2020 12:19 09/27/2020 13:46  Anti Nuclear Antibody (ANA) Latest Ref Range: NEGATIVE  NEG  NEGATIVE  Anti JO-1 Latest Ref Range: 0.0 - 0.9 AI 123XX123    Cyclic Citrullin Peptide Ab Latest Units: UNITS <16  <16  ds DNA Ab Latest Units: IU/mL 1    RA Latex Turbid. Latest Ref Range: <14 IU/mL <14 <14 <14  ENA SM Ab Ser-aCnc Latest Ref Range: <1.0 NEG AI  <1.0 NEG    ANA,IFA RA DIAG PNL W/RFLX TIT/PATN Unknown   Rpt  Ribonucleic Protein(ENA) Antibody, IgG Latest Ref Range: <1.0 NEG AI  <1.0 NEG    SSA (Ro) (ENA) Antibody, IgG Latest Ref Range: <1.0 NEG AI <1.0 NEG <1.0 NEG   SSB (La) (ENA) Antibody, IgG Latest Ref Range: <1.0 NEG AI <1.0 NEG <1.0 NEG   Scleroderma (Scl-70) (ENA) Antibody, IgG Latest Ref Range: <1.0 NEG AI  <1.0 NEG      OV 11/08/2020  Subjective:  Patient ID: [name], male , DOB: 10-Aug-1940 , age 72 y.o. , MRN: [id number] , ADDRESS: [place] [id number] PCP [name], MD Patient Care Team: [name], MD as PCP - General (Family Medicine) Festus Aloe, MD as Consulting Physician (Urology) Michael Boston, MD as Consulting Physician (General Surgery) [name], MD as Consulting Physician (Cardiology) Jodi Marble, MD as Consulting  Physician (Otolaryngology) Deboraha Sprang, MD as Consulting Physician (Cardiology) Luberta Mutter, MD as Consulting Physician (Ophthalmology) Lamonte Sakai Rose Fillers, MD as Consulting Physician (Pulmonary Disease) Mauri Pole, MD as Consulting Physician (Gastroenterology) [name], [facility] as Pharmacist (Pharmacist)  This Provider for this visit: Treatment Team:  Attending Provider: Brand Males, MD    11/08/2020 -   Chief Complaint  Patient presents with   Follow-up    Pt states that he has been worried about taking the [place] and states that he has not started taking it yet. States his breathing is about the same.     HPI [name] 82 y.o. - here with wife to discuss disease, Wife is 76 years old. Her name is Myrnal She was a clinical trials coordinator in the late 1990s at [employer] and was involved in oncology and sildenafil trials. Lot of questions on type of fibrosis (IPF), natural course (few to several  yearS), quality of life expectations, anti fibrotics . Wants symptom relief - referred to rehab. HE is interested in support group - gave him contact number. Risk factor - prior smoking and amio. He has not started esbriet yet. Will do on 11/14/20 . This is becase he and wife went to  reunion. Ate fried food and got diarrhea. Now resolved.   [employer] Comprehensive ILD Questionnaire        Past Medical History :  Also copd, cad, chf nos, osa - not using cPAP. Hx of pna > 10 years ago. Hx of renal sones + Has had 5 mRNA covid shots. Last Sept 2022. Never had covid Chronic systolic chf - ef [id number] in June 2021 (20% in 2017) Patulous esophagus GERD Hx of amio 15--20 years. STppped 2019 Elevated R diapharagm  ROS:  Fatigue + Dry eyes + Gerd +  FAMILY HISTORY of LUNG DISEASE:  denes  PERSONAL EXPOSURE HISTORY:  Smoker [phone]. NOt smoked in 49  years  HOME  EXPOSURE and HOBBY DETAILS :  [place] yers[place]. 2 of his tile floors have mildew. Not used jacuzzi in 10 years. 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  Patient has underlying COPD and IPF.  CT chest in September 22, 2020 showed probable UIP.  And mildly progressive compared to previous study.  Patient was recommended to start on Esbriet.  Patient says he has started Esbriet and seems to be tolerating okay.  He has had a couple episodes where he has some loose stools and he took Imodium.  Unfortunately did have some constipation so he also took MiraLAX which caused him to have upset stomach.  We discussed dietary recommendations and the use of Imodium.  Patient says appetite is good.  No nausea vomiting.  Patient is on Mexiletine , his Esbriet dose will need to remain at 534 mg 3 times a day.  Patient is a been on a tapering dose and has reached his maximum maintenance dose. Patient has shortness of breath with activities.  Remains active.  Gets winded with heavy lifting or prolonged walking.  Has a daily minimally productive cough.   12/06/2020 Follow up ; IPF, COPD  Patient returns for a 2-week follow-up.  Patient has underlying COPD.  Is on Breztri inhaler.  Patient says overall breathing is doing okay.  Feels that it has slightly improved with decreased cough.  Patient also has underlying IPF.  Recently started on Esbriet.  He is on a lower dose due to drug interactions.  Currently on 534 mg 3 times a day.  Last visit patient was having some increased stomach issues.  We discussed some dietary changes.  Patient is now eating less fried foods and higher protein.  Patient says this is made a huge difference and stomach is much better. Hepatic function panel on November 16, 2020 showed normal LFTs.   TEST/EVENTS :  09/22/2020 High-resolution CT chest compatible with interstitial lung disease probable UIP, mildly progressive compared to previous stud      OV 12/22/2020  Subjective:  Patient ID: [name], male , DOB: [birth year] , age 4 y.o. , MRN: [id number] , ADDRESS: [place] [id number] PCP [name], MD Patient Care Team: [name], MD as PCP - General (Family Medicine) Festus Aloe, MD as Consulting Physician (Urology) Michael Boston, MD as Consulting Physician (General Surgery) Stanford Breed, Denice Bors, MD as Consulting Physician (Cardiology) Jodi Marble, MD as Consulting Physician (Otolaryngology) Deboraha Sprang, MD as Consulting Physician (Cardiology) Luberta Mutter, MD as Consulting Physician (Ophthalmology) Lamonte Sakai Rose Fillers, MD as Consulting Physician (Pulmonary Disease) Mauri Pole, MD as Consulting Physician (Gastroenterology) [name], [facility] as Pharmacist (Pharmacist)  This Provider for this visit: Treatment Team:  Attending Provider: Brand Males, MD   IPF dx given Sept 2022 -> started esbriet  12/22/2020 -   Chief Complaint  Patient presents with   Follow-up     HPI [name] 82 y.o. presents with his wife.-since giving him the diagnosis of IPF and recommending pirfenidone over nintedanib [he has heart disease] he see nurse practitioner 2x1 for a face-to-face visit not a video visit.  He is on 2 pills 3 times a day of pirfenidone.  This is because he is on mexiletine [antiarrhythmic that can also cause abdominal pain, headache and nausea] because of this and CYPa12 mechanism that could inhibit pirfenidone metabolism resulting in elevated levels of pirfenidone he has been kept on 2 pills 3 times daily.  He is upset that this was not picked up by the pulmonary pharmacist but only by the cardiology pharmacist.  He reports because of this he is lost a little bit of confidence in [place].  Overall though he says that he has chronic alternating diarrhea with constipation.  He takes Imodium for the diarrhea and then this resolves and constipation.  Then he has to take MiraLAX.  This is at his baseline despite being on Esbriet it is not worse.  He says he is just frustrated by this alternating constipation and diarrhea.  Between diarrhea and constipation  he does not favor constipation.  The only side effect he is noticing from [place] is that he is more fatigued.  Otherwise he is overall stable as seen by the symptom score below other than fatigue.  He is also worried because he is on submaximal doses of pirfenidone that his disease could progress.  I did explain to him that even at 2 pills 3 times daily it is beneficial and with inhibitor effects of mexiletine levels are slightly higher and could potentially reflect similar to being on 3 pills 3 times daily.  Did explain to him that  he might not be able to tolerate 3 pills 3 times daily.  Given his cardiac issues nintedanib currently as second line.  We had a shared understanding and agreement on this approach.  No results found.       OV 01/19/2021  Subjective:  Patient ID: [name], male , DOB: 1940/10/15 , age 70 y.o. , MRN: [id number] , ADDRESS: [place] [id number] PCP [name], MD Patient Care Team: [name], MD as PCP - General (Family Medicine) Festus Aloe, MD as Consulting Physician (Urology) Michael Boston, MD as Consulting Physician (General Surgery) Stanford Breed Denice Bors, MD as Consulting Physician (Cardiology) Jodi Marble, MD as Consulting Physician (Otolaryngology) Deboraha Sprang, MD as Consulting Physician (Cardiology) Luberta Mutter, MD as Consulting Physician (Ophthalmology) Lamonte Sakai Rose Fillers, MD as Consulting Physician (Pulmonary Disease) Mauri Pole, MD as Consulting Physician (Gastroenterology) [name], [facility] as Pharmacist (Pharmacist)  This Provider for this visit: Treatment Team:  Attending Provider: Brand Males, MD    01/19/2021 -   Chief Complaint  Patient presents with   Follow-up    PFT performed today.  Pt states that he noticed with last bloodwork that his liver enzymes were elevated and also states that he has noticed some yellowing of skin.  IPF dx given Sept 2022 -> started  esbriet   HPI [name] 82 y.o. -returns for follow-up.  This visit is to see his pulmonary function test and his uptake with pirfenidone.  His pulmonary function test shows stability/slight improvement from September 2022 and similar to his previous baseline.  In terms of pirfenidone intake he continues to have alternating diarrhea and constipation.  In talking to him in more detail he says before his Esbriet he used to take MiraLAX once every other day and have a bowel movement [hard stool] every day once.  After this starting as.  He is now taking his MiraLAX only 1/week.  1 day he has diarrhea in 4 days he is got constipation without any bowel movement.  Therefore he is not taking his MiraLAX for his previous schedule which we discussed at the last visit.  In addition he is lost 1 pound of weight.  He is also reporting intermittent gaseous symptoms bloating acid reflux sour taste.  He takes Tums for this.  He is not taking anything for acid reflux such as PPI or H2 blockade.  He is just taking Tums.  We did discuss the idea of taking ginger capsules but he drinks ginger ale 1 bottle every day.  Did indicate to him even in previous visit that sugary drinks can cause diarrhea and potentially a carbonated drink can contribute to acid reflux.  He is agreed to stop this.  I showed him an example of a ginger capsule that he could buy at [facility] or convenience store.  Of note his Esbriet intake is 2 pills 3 times daily due to drug interaction with mexiletine. For associated emphysema is on inhaler therapy.  Of note: His AST on 01/01/2021 was 44.  Other lab calls is abnormal even though it is below 45.  He was informed of this results.  The plan is to retest today.  He does tell me that he and his wife noticed that 1 day a few weeks ago that his left finger was yellow and after that it turned out to be normal after a few days.  Due to concerns of potential jaundice and worsening LFTs [place] and the  ongoing  GI side effects have asked him to stop his Esbriet till further notice or 1 week    OV 03/09/2021  Subjective:  Patient ID: [name], male , DOB: [birth year] , age 12 y.o. , MRN: [id number] , ADDRESS: [place] [id number] PCP [name], MD Patient Care Team: [name], MD as PCP - General (Family Medicine) Festus Aloe, MD as Consulting Physician (Urology) Michael Boston, MD as Consulting Physician (General Surgery) Stanford Breed Denice Bors, MD as Consulting Physician (Cardiology) Jodi Marble, MD as Consulting Physician (Otolaryngology) Deboraha Sprang, MD as Consulting Physician (Cardiology) Luberta Mutter, MD as Consulting Physician (Ophthalmology) Lamonte Sakai Rose Fillers, MD as Consulting Physician (Pulmonary Disease) Mauri Pole, MD as Consulting Physician (Gastroenterology) [name], [facility] (Pharmacist)  This Provider for this visit: Treatment Team:  Attending Provider: Brand Males, MD    03/09/2021 -   Chief Complaint  Patient presents with   Follow-up    Pt states he is about the same since last visit. States he recently began pulmonary rehab.   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 -returns for his IPF follow-up.  He has further weight loss.  He is now down to 199 pounds although he is still overweight.  His BMI is over 28.  He is confident that his weight loss is because of medications.  It is otherwise drug-induced weight loss.  He believes the pirfenidone has reduced his appetite.  In addition Jardiance is also causing weight loss.  At this point in time he is happy with his weight loss because he is still overweight.  Overall he is tolerating his pirfenidone well.  He was having alternating diarrhea and constipation but after taking my  suggestion and trialing out different regimens he is now settled on taking omeprazole along with ginger and MiraLAX in a scheduled way.  This is now helped prevent the alternating diarrhea and constipation.  He is quite happy with this.  He takes his pirfenidone at 2 pills 3 times daily and 5-6 hours apart.  It does interfere with the social schedule.  Sometimes family wants to have have dinner or lunch at different times.  He is asking for some flexibility.  I encouraged him to try taking it slightly out of schedule and also with different types of snacks and experiment to see whether he has any side effects.  I did indicate to him ultimately the side effects of what will determine his tolerance.  He tells me that the omeprazole is doing quite well to control the GERD.  However the over-the-counter blister pack says it is only for 14 days.  He took clarification on this.  We discussed the long-term effects of omeprazole but overall taking balance versus risk I recommended that he continue his omeprazole for now.  In terms of his symptoms he stable.  He is attending pulmonary rehabilitation.  He says it is helping him.  His wife used to be a clinical trials coordinator.  He is interested in clinical trials.  However at this point in time he says his son is requested that he take a second opinion at [facility].  He is seeing Dr. Wynn Maudlin on 03/14/2021.  I did indicate to him that we are all part of the pulmonary fibrosis  foundation network.  I did indicate to him that Dr. Randol Kern is a well-known expert.  I have also indicated to him that I support the second opinion and I have personally emailed Dr. Randol Kern.  I have asked the patient to take CD-ROM of the CT scan to [facility].  Recent lab work 7 days ago shows normal creatinine and normal liver function test.  [facility] Dr Maxie Barb 03/14/21  Mr. Lafontaine is a very pleasant 81 y.o.-year-old male with no significant prior pulmonary problems as a child or  younger adult. He also has history of CAD, ICM/chronic systolic/diastolic HF s/p ICD, Afib on mexiletine, HTN, HLD. In 2021, he first noticed worsening shortness of breath on exertion but also attributed to COPD and Afib. Think it has been gradually worsening over the past 6 months but denies any recent illnesses. He was initially diagnosed based on imaging showing probable UIP during management of COPD. Followed by Dr. Romilda Garret. Never treated with steroids. Negative autoimmune panel in 2017 and 09/2020. He was previously on amiodarone for 15-20 years and stopped in 2019 due to concerns for ILD. Started on Esbriet in Sept 2022. Initially had a lot of diarrhea and constipation, and now more tolerable with less GI issues since starting omeprazole and ginger root and miralax 2-3x /week. Wears sunscreen and UV protective clothing.   He reports breathing is ok. Biggest problem is dry mouth and cough productive light yellow/green sputum chronic for the past 4 years attributed to COPD and chronic sinus drainage. Takes Breztri, albuterol, ipratropium nasal sprays which helps. Able to clear sputum. Started pulmonary rehab about 3 weeks ago in mid Feb 2023 which has helped a lot. Doing it twice a week, first 15 minutes walking, 2nd 15 minutes with stationary elliptical, then stretching but has to rest 2-3 hours at home. No fever/chills/night sweats. Lost weight 211 to 193lb since starting Esbriet.  His current exercise capacity includes walking 15 minute every morning [place]   Mr. Goodlow is a very pleasant 82 y.o.-year-old male with no significant prior pulmonary problems as a child or younger adult. He also has history of CAD, ICM/chronic systolic/diastolic HF s/p ICD, Afib on mexiletine, HTN, HLD. In 2021, he first noticed worsening shortness of breath on exertion but also attributed to COPD and Afib. Think it has been gradually worsening over the past 6 months but denies any  recent illnesses. He was initially diagnosed based on imaging showing probable UIP during management of COPD. Followed by Dr. Romilda Garret. Never treated with steroids. Negative autoimmune panel in 2017 and 09/2020. He was previously on amiodarone for 15-20 years and stopped in 2019 due to concerns for ILD. Started on Esbriet in Sept 2022. Initially had a lot of diarrhea and constipation, and now more tolerable with less GI issues since starting omeprazole and ginger root and miralax 2-3x /week. Wears sunscreen and UV protective clothing.   He reports breathing is ok. Biggest problem is dry mouth and cough productive light yellow/green sputum chronic for the past 4 years attributed to COPD and chronic sinus drainage. Takes Breztri, albuterol, ipratropium nasal sprays which helps. Able to clear sputum. Started pulmonary rehab about 3 weeks ago in mid Feb 2023 which has helped a lot. Doing it twice a week, first 15 minutes walking, 2nd 15 minutes with stationary elliptical, then stretching but has to rest 2-3 hours at home. No fever/chills/night sweats. Lost weight 211 to 193lb since starting Esbriet.  His current  exercise capacity includes walking 15 minute every morning [place]    Mr. Gladin is a 82 y.o. 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 He has overall been stable to very slightly progressed on imaging and PFTs for more than 6-8 years which is atypical for IPF. [place]. Negative autoimmune labs. He has been appropriately managed with Esbriet, dose reduced due to interactions with mexiletine, as well as pulmonary rehab. He is also being closely followed by cardiology for systolic HF with last echo in 2021 showing no RV dysfunction. Recommend to continue current dose of Esbriet and encouraged  to stay active while at home.  He also has a large esophagus on imaging and he reports severe acid reflux prior to starting Esbriet that is now better controlled on Esbriet. Aspiration and reflux unlikely to be primary cause of fibrosis but may be exacerbating his disease in predominantly basilar distribution so educated patient on aspiration precautions including raising HOB at least 30 degrees and not eating meals at least 2-3 hours before bedtime. He will try to nap in his recliner with head raised instead of lying flat in bed.   OV 05/07/2021  Subjective:  Patient ID: [name], male , DOB: 08-02-1940 , age 19 y.o. , MRN: [id number] , ADDRESS: [place] [id number] PCP [name], MD Patient Care Team: [name], MD as PCP - General (Family Medicine) Festus Aloe, MD as Consulting Physician (Urology) Michael Boston, MD as Consulting Physician (General Surgery) Stanford Breed Denice Bors, MD as Consulting Physician (Cardiology) Jodi Marble, MD as Consulting Physician (Otolaryngology) Deboraha Sprang, MD as Consulting Physician (Cardiology) Luberta Mutter, MD as Consulting Physician (Ophthalmology) Lamonte Sakai Rose Fillers, MD as Consulting Physician (Pulmonary Disease) Mauri Pole, MD as Consulting Physician (Gastroenterology) [name], [facility] (Pharmacist) Dimitri Ped, RN as Case Manager  This Provider for this visit: Treatment Team:  Attending Provider: Brand Males, MD    05/07/2021 -   Chief Complaint  Patient presents with   Follow-up    Pt states he has been doing well since last visit and denies any complaints.    HPI [name] 82 y.o. -returns for follow-up.  After last visit he did see Dr. Wynn Maudlin at [facility].  I reviewed the notes.  Really appreciate Dr. Randol Kern adding an extra perspective.  His notes are captured above.  Patient continues on pirfenidone at 2 pills 3 times daily in the  setting of mexiletine.  He has completed rehabilitation.  Has had 5 COVID boosters.  He asked me about taking more booster.  I recommended to hold off but again emphasized his risk benefit ratio.  Did express that if he got COVID he could take antiviral.  We should wait till a new vaccine for the new strains comes up.  At this point in time the vaccine is for the old strains.  He feels overall stable after taking pirfenidone and also pulmonary rehabilitation.  He says this is the best he is felt.  He continues with his alternating diarrhea and constipation but it is actually improved.  Because of this he does not want to do any clinical trial that might have GI side effects.  He is interested in clinical trials but at this point  in time he wants to hold off.  He got a letter from Dr. Silverio Decamp about undergoing colonoscopy.  I did approve this.  He continues to lose weight he says this is unintentional and account of pirfenidone and his diabetic drug.  Currently the level of weight loss is healthy.  Of note he has chronic systolic heart failure.  Last echocardiogram was in 2021.  He is going to see Dr. Stanford Breed in June 2023.  I did recommend getting an echocardiogram before he saw Dr. Stanford Breed and I will order this I had of Dr. Jacalyn Lefevre visit.  He is okay with this plan.  He wants to have this done . HAve this done in [place] area    CT Chest data  No results found.    PFT  OV 08/03/2021  Subjective:  Patient ID: [name], male , DOB: 1940-05-22 , age 28 y.o. , MRN: [id number] , ADDRESS: [place] [address] PCP [name], MD Patient Care Team: [name], MD as PCP - General (Family Medicine) Festus Aloe, MD as Consulting Physician (Urology) Michael Boston, MD as Consulting Physician (General Surgery) Stanford Breed Denice Bors, MD as Consulting Physician (Cardiology) Jodi Marble, MD as Consulting Physician (Otolaryngology) Deboraha Sprang, MD  as Consulting Physician (Cardiology) Luberta Mutter, MD as Consulting Physician (Ophthalmology) Lamonte Sakai Rose Fillers, MD as Consulting Physician (Pulmonary Disease) Mauri Pole, MD as Consulting Physician (Gastroenterology) [name], [facility] (Pharmacist)  This Provider for this visit: Treatment Team:  Attending Provider: Brand Males, MD    08/03/2021 -   Chief Complaint  Patient presents with   Follow-up    Follow-up IPF cough, SOB, Wheezing     HPI [name] 82 y.o. -+ returns for follow-up.  Since his last visit he continues to be stable from a dyspnea standpoint.  Is undergoing pulmonary rehabilitation.  On the pulmonary function test is a little bit waxing and waning but I would say the overall trend in the last 1 year appears stable although definitely progressive in the last 3 years.  It appears that since starting pirfenidone he is likely stable.  He is on the lower dose pirfenidone.  The main issue is that he is struggling with the pirfenidone.  His fatigue is a little more than usual.  He is also having alternating constipation and diarrhea.  However he is able to be stable on this.  Also he says that since attending rehabilitation his got some right infrascapular infra axillary muscle pull symptoms.  He is wondering if it is because of rehabilitation.  His CT scan late last year did not show any abnormalities consistent with a mass.  He has had weight loss because of pirfenidone and also Jardiance.  Although he feels this is stabilized.  He feels it is in a happy point.  He had an echocardiogram and his ejection fraction is 35%.   PFT   OV 11/08/2021  Subjective:  Patient ID: [name], male , DOB: 1940/06/26 , age 7 y.o. , MRN: [id number] , ADDRESS: [place] [address] PCP [name], MD Patient Care Team: [name], MD as PCP - General (Family Medicine) Festus Aloe, MD as Consulting Physician  (Urology) Michael Boston, MD as Consulting Physician (General Surgery) Stanford Breed, Denice Bors, MD as Consulting Physician (Cardiology) Jodi Marble, MD as Consulting Physician (Otolaryngology) Deboraha Sprang, MD as Consulting Physician (Cardiology) Luberta Mutter, MD as Consulting Physician (Ophthalmology) Lamonte Sakai,  Rose Fillers, MD as Consulting Physician (Pulmonary Disease) Mauri Pole, MD as Consulting Physician (Gastroenterology) [name], [facility] (Pharmacist)  This Provider for this visit: Treatment Team:  Attending Provider: Brand Males, MD   11/08/2021 -   Chief Complaint  Patient presents with   Follow-up    Follow-up for CT PT states no changes with breathing     HPI [name] 82 y.o. -returns for follow-up.  Presents with his wife.  Wife is returning after a long time.  She is to work in clinical trials.  He is feeling stable although he is continue to lose weight.  He feels the low-dose of Esbriet is being well-tolerated except for significant fatigue which she attributes to pirfenidone.  He is continue to lose weight which is a combination of Jardiance for his heart failure and also his pirfenidone.  He still technically overweight with a current weight with a BMI of 26 but he is getting close to ideal body weight.  He is aware that if he starts going below ideal body weight then we will have to make decisions about stopping pirfenidone or Jardiance.  Last visit there was some confusion but this pulmonary function test was worsening so we got a high-resolution CT chest and this shows stability for the last 1 year suggesting beneficial effects of pirfenidone/Esbriet.  However he does have progressive disease over the course of few years.  His GI symptoms are more stable at this point.  Even though he has a lot of fatigue he is willing to continue with pirfenidone  He is interested in clinical trials as a care option.  His wife worked in clinical trials he  understands this experimental nature of clinical trials with drug development he understands the placebo concept.  We discussed the fact we have 5 trials going on.  I do not recommend 2 which had significant GI side effects.  He does not qualify for cough study because he is on carvedilol and this drug interactions.  He is aware that studies of strict inclusion exclusion criteria and his medical problems from his heart or EKG or drug interaction issues could preclude him.  With that in mind we discussed the potential of the following studies  Inhaled treprostinil: Preliminary study shows beneficial effects and IPF with lung function improvement.  This medicine is available in the market for many years for pulmonary hypertension.  He will have to take it as a nebulizer 4 times daily and each time 9-12 times.  The study visits are not too cumbersome.  However he finds this inconvenient from a medication administration standpoint  Subcutaneous injection monoclonal antibody Vixarelimab: Every 2 weeks.  Evidence is only on prurigo appears 1 yesterday with open label extension.  Injections every 2 weeks the significant number of visits.  We discussed side effect profile on this based on established literature.  He is open to this idea but wants a second choice.  50% chance of placebo   Oral tablet Bexotegrast - Pliant study.  33% chance of placebo.  Well-tolerated in the early phase to 78-monthstudy.  Minimal GI side effects.  Initial improvement over 4 weeks and then some tailing off on preliminary data but still significant improvement compared to placebo.  He is interested in this.  The study is not activated at our location.  I gave him publicly available information of the above studies to him and his wife.    OV 02/21/2022  Subjective:  Patient ID:  [name], male , DOB: [birth year] , age 63 y.o. , MRN: [id number] , ADDRESS: [place] [address] PCP [name],  MD Patient Care Team: [name], MD as PCP - General (Family Medicine) Festus Aloe, MD as Consulting Physician (Urology) Michael Boston, MD as Consulting Physician (General Surgery) Stanford Breed Denice Bors, MD as Consulting Physician (Cardiology) Jodi Marble, MD as Consulting Physician (Otolaryngology) Deboraha Sprang, MD as Consulting Physician (Cardiology) Luberta Mutter, MD as Consulting Physician (Ophthalmology) Lamonte Sakai Rose Fillers, MD as Consulting Physician (Pulmonary Disease) Mauri Pole, MD as Consulting Physician (Gastroenterology) [name], [facility] (Pharmacist)  This Provider for this visit: Treatment Team:  Attending Provider: Brand Males, MD    02/21/2022 -   Chief Complaint  Patient presents with   Follow-up    Review PFT today.  Cough persistent.  Only able to do minimal exercises due to fatigue and SOB.  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 -reports for followup.  Overall feels stable but reporting fatigue and ongoing weight loss. We discussed the possibilities that this is esbriet which Is very likely. However, today walking Pacer HR stuck at 60/min. He is also on Jardiance which contributes to weight loss. Weight currently at Ideal body weight. We discussed that any further weight loss a-> then we would have to stop esbriet or jardiance ((likely esbriet) for a 1-2 months and then see if weight goes up.He does not like the idea of stopping esbriet.   So, we diuscussed calorie count and improving nutrition  He  has also decided against participating in clniical trial - His QTcF baseline is prolonged anyways. He is also asking for a spacer for Breztri     Symptoms:  SYMPTOM SCALE - ILD 11/08/2020 12/22/2020 Esbriet since sept/pct 2022 01/19/2021 Esbreit 2 pills tid 03/09/2021  05/07/2021  08/03/2021  11/08/2021  02/21/2022   Current weight 209# 207# 206# 199# 194# 190# - esbriet 185# - esbrieet, jardiance 183#  O2 use ra ra        Shortness of Breath 0 -> 5 scale with 5 being worst (score 6 If unable to do)         At rest 0 0 0 0 0 0 0 0  0Simple tasks - showers, clothes change, eating, shaving 0 0 0 0 0 0 1 0  Household (dishes, doing bed, laundry) 0 0 0 0 0 0 1 0  Shopping 3 2 0 4 4 3 5 4  $ Walking level at own pace 4 2 2 1 1 1 2 3  $ Walking up Stairs 5 5 3 4 4 4 4 5  $ Total (30-36) Dyspnea Score 12 x 2 years  same Since onset 9 5 9 9 8 13 12  $ How bad is your cough? moderate x  2years. Getting worse. Has light yellow/green sputum. Clears throat 1 4 4 3 4 4 5  $ How bad is your fatigue light 5 4 4 3 5 4 3  $ How bad is nausea 0 1 2  1 0 3 2 0  How bad is vomiting?  0 0 0 0 0 0 0 0  How bad is diarrhea? 0 4 5 1 2 4 4 4  $ How bad is anxiety? 0 4 3 1 $ 0 2 0 0  How bad is depression 0 1 2 0 0 0 0 0  Any chronic pain - if so where and how bad 0     x x      Simple office walk 185 feet x  3 laps goal with forehead probe 10/05/2020  03/09/2021  08/03/2021  02/21/2022   O2 used ra ra ra   Number laps completed 3 3 3   $ Comments about pace avg avg nl   Resting Pulse Ox/HR 100% and 70/min 99% and 70 97% and HR 60 99%  Final Pulse Ox/HR 97% and 82/min 97% and 82 98% and HR 72 97%  Desaturated </= 88% no no np   Desaturated <= 3% points yes no no   Got Tachycardic >/= 90/min no no no   Symptoms at end of test Mild dyspnea no no   Miscellaneous comments x x       Modified Six Minute Walk - 02/21/22 1500     Type of O2 used  Room Air    Number of laps completed  3    Lap Pace Brisk    Resting Heartrate  60 bpm    Final Heartrate 60 bpm    Resting Pulse Ox 99 %    Desaturated to <= 3 points No    Desaturated to < 88% No    Became tachycardic No    Symptoms  No dyspnea or SOB noted    comments Patient walked at brisk pace.  was able to complete 3 laps without difficulty.              PFT     Latest Ref Rng & Units 02/21/2022    2:53 PM 08/03/2021    2:41 PM 01/19/2021   12:54 PM 09/27/2020    9:51 AM 02/09/2018    1:53 PM 03/12/2017    1:55 PM 02/08/2015    9:53 AM  PFT Results  FVC-Pre L 2.46  P 2.43  2.67  2.34  3.02  2.82  3.20   FVC-Predicted Pre % 58  P 57  63  55  69  64  71   FVC-Post L    2.44  2.97  2.78  3.19   FVC-Predicted Post %    57  67  63  71   Pre FEV1/FVC % % 86  P 87  82  84  82  82  82   Post FEV1/FCV % %    82  82  83  82   FEV1-Pre L 2.12  P 2.11  2.19  1.98  2.48  2.30  2.61   FEV1-Predicted Pre % 70  P 69  72  65  78  72  80   FEV1-Post L    2.00  2.42  2.30  2.61   DLCO uncorrected ml/min/mmHg 15.74  P 14.82  14.09  14.62  18.10  18.46  19.06   DLCO UNC% % 62  P 58  55  57  53  54  56   DLCO corrected ml/min/mmHg 15.74  P 14.82  14.09  14.62  17.90     DLCO COR %Predicted % 62  P 58  55  57  53  DLVA Predicted % 107  P 103  93  101  90  95  90   TLC L    4.17  4.65  4.94  4.59   TLC % Predicted %    57  64  68  63   RV % Predicted %    46  56  68  44     P Preliminary result       has a past medical history of Adenomatous colon polyp (2009; 06/2012), AICD (automatic cardioverter/defibrillator) present, Atrial fibrillation ([place]), Bilateral sensorineural hearing loss, BPH (benign prostatic hypertrophy), CAD (coronary artery disease), CHF (congestive heart failure) ([place]), Chronic constipation, Chronic rhinitis, COPD (chronic obstructive pulmonary disease) ([place]), Diverticulosis of colon (2005), Gallstones (06/2014), GERD (gastroesophageal reflux disease), Hepatic steatosis (2010; 2016), Hyperlipidemia, Hypertension, Ischemic cardiomyopathy, Microscopic  hematuria, Nephrolithiasis, Obstructive sleep apnea, Presence of permanent cardiac pacemaker, Squamous cell carcinoma of back, and Ventricular tachycardia ([place]).   reports that he quit smoking about 42 years ago. His smoking use included cigarettes. He has a 30.00 pack-year smoking history. He has never used smokeless tobacco.  Past Surgical History:  Procedure Laterality Date   ATRIAL ABLATION SURGERY     flutter ablation 2002   BREAST SURGERY Left 1990   "Tissue removed from breast"   CARDIAC CATHETERIZATION     CARDIOVERSION N/A 05/10/2020   Procedure: CARDIOVERSION;  Surgeon: Sanda Klein, MD;  Location: [facility] ENDOSCOPY;  Service: Cardiovascular;  Laterality: N/A;   carotid doppler  09/25/12   No signif stenosis   COLONOSCOPY N/A 07/13/2012   Diverticula ascending colon.  Polyp x 1-recall 5 yrs (Dr. Watt Climes) Procedure: COLONOSCOPY;  Surgeon: Jeryl Columbia, MD;  Location: [facility] ENDOSCOPY;  Service: Endoscopy;  Laterality: N/A; polypectomy x 1    COLONOSCOPY WITH PROPOFOL N/A 08/18/2015   Procedure: COLONOSCOPY WITH PROPOFOL;  Surgeon: Mauri Pole, MD;  Location: [facility] ENDOSCOPY;  Service: Endoscopy;  Laterality: N/A;   ESOPHAGOGASTRODUODENOSCOPY N/A 07/13/2012   Procedure: ESOPHAGOGASTRODUODENOSCOPY (EGD);  Surgeon: Jeryl Columbia, MD;  Location: [facility];  Service: Endoscopy;  Laterality: N/A;  NORMAL   EXTRACORPOREAL SHOCK WAVE LITHOTRIPSY     HEMORRHOID BANDING     ICD     a) Guidant Contak H170- Virl Axe, MD 07/25/06 (second device) b) Medtronic CRT-D   ICD GENERATOR CHANGEOUT N/A 02/14/2020   Procedure: ICD GENERATOR CHANGEOUT;  Surgeon: Deboraha Sprang, MD;  Location: [facility] LAB;  Service: Cardiovascular;  Laterality: N/A;   INGUINAL HERNIA REPAIR Bilateral 1975   LEAD REVISION Bilateral 12/26/2011   Procedure: LEAD REVISION;  Surgeon: Evans Lance, MD;  [facility] CATH LAB;  Service: Cardiovascular;  Laterality: Bilateral;   PFTs  02/2015   Mod restriction  (interstitial), mod diffusion defect (Dr. Annamaria Boots)   precancer area keratsis removed  05/2015    leftf orehead and left arm and right arm and back    Allergies  Allergen Reactions   Sulfa Antibiotics Rash    Immunization History  Administered Date(s) Administered   Fluad Quad(high Dose 65+) 09/23/2018, 10/05/2020, 11/08/2021   Influenza Split 11/11/2010, 11/02/2012, 10/14/2013   Influenza Whole 09/27/2011, 11/13/2011   Influenza, High Dose Seasonal PF 09/26/2015, 10/07/2016   Influenza, Seasonal, Injecte, Preservative Fre 09/23/2018   Influenza,inj,Quad PF,6+ Mos 11/03/2016, 09/20/2017   Influenza-Unspecified 09/26/2014, 10/15/2019, 11/11/2021   PFIZER Comirnaty(Gray Top)Covid-19 Tri-Sucrose Vaccine 04/24/2020   PFIZER(Purple Top)SARS-COV-2 Vaccination 02/02/2019, 02/22/2019, 10/26/2019, 09/29/2020, 11/14/2021   Pneumococcal Conjugate-13 06/15/2014   Pneumococcal Polysaccharide-23 03/15/2002, 07/10/2015  Respiratory Syncytial Virus Vaccine,Recomb Aduvanted(Arexvy) 11/14/2021   Td 08/27/2017   Tdap 04/04/2010   Zoster Recombinat (Shingrix) 03/29/2016   Zoster, Live 05/15/2015    Family History  Problem Relation Age of Onset   Heart disease Mother    Other Mother        colon surgery for fistula   Gallstones Mother    Breast cancer Maternal Grandmother    Coronary artery disease Father        family hx of   Heart attack Father        4 stents/ pacemaker   Nephrolithiasis Father    Hypertension Sister    Goiter Paternal Grandmother    Nephrolithiasis Son    Colon cancer Neg Hx      Current Outpatient Medications:    antiseptic oral rinse (BIOTENE) LIQD, 15 mLs by Mouth Rinse route in the morning and at bedtime., Disp: , Rfl:    BREZTRI AEROSPHERE 160-9-4.8 MCG/ACT AERO, INHALE 2 PUFFS INTO THE LUNGS IN THE MORNING AND AT BEDTIME, Disp: 10.7 g, Rfl: 6   carvedilol (COREG) 3.125 MG tablet, TAKE 1 TABLET(3.125 MG) BY MOUTH TWICE DAILY WITH A MEAL, Disp: 180 tablet, Rfl:  3   cholecalciferol (VITAMIN D) 1000 units tablet, Take 1,000 Units by mouth every evening., Disp: , Rfl:    clotrimazole (MYCELEX) 10 MG troche, Dissolve, swish and swallow one tablet by mouth 5 times daily for 14 days., Disp: 70 tablet, Rfl: 1   empagliflozin (JARDIANCE) 10 MG TABS tablet, Take 1 tablet (10 mg total) by mouth daily before breakfast., Disp: 90 tablet, Rfl: 3   ENTRESTO 24-26 MG, TAKE 1 TABLET BY MOUTH TWICE DAILY, Disp: 180 tablet, Rfl: 3   eplerenone (INSPRA) 25 MG tablet, TAKE 1 TABLET(25 MG) BY MOUTH DAILY, Disp: 90 tablet, Rfl: 3   furosemide (LASIX) 40 MG tablet, Take 1 tablet (40 mg total) by mouth daily., Disp: 90 tablet, Rfl: 3   Ginger, Zingiber officinalis, (GINGER ROOT) 550 MG CAPS, Take 1 capsule by mouth daily., Disp: , Rfl:    ipratropium (ATROVENT) 0.06 % nasal spray, Place 2 sprays into both nostrils 3 (three) times daily., Disp: 15 mL, Rfl: 1   levalbuterol (XOPENEX HFA) 45 MCG/ACT inhaler, INHALE 2 PUFFS INTO THE LUNGS EVERY 4 HOURS AS NEEDED FOR WHEEZING, Disp: 15 g, Rfl: 6   loratadine (CLARITIN) 10 MG tablet, Take 10 mg by mouth in the morning., Disp: , Rfl:    Magnesium 250 MG TABS, Take 250 mg by mouth every evening., Disp: , Rfl:    mexiletine (MEXITIL) 150 MG capsule, Take 2 capsules (300 mg total) by mouth 2 (two) times daily., Disp: 360 capsule, Rfl: 2   Multiple Vitamin (MULTIVITAMIN WITH MINERALS) TABS, Take 1 tablet by mouth every evening., Disp: , Rfl:    nitroGLYCERIN (NITROSTAT) 0.4 MG SL tablet, DISSOLVE 1 TABLET UNDER THE TONGUE EVERY 5 MINUTES AS NEEDED FOR CHEST PAIN, Disp: 25 tablet, Rfl: 5   Olopatadine HCl (PATADAY OP), Apply 1 drop to eye daily., Disp: , Rfl:    Pirfenidone (ESBRIET) 267 MG TABS, Take 2 tablets (534 mg total) by mouth with breakfast, with lunch, and with evening meal., Disp: 540 tablet, Rfl: 0   polyvinyl alcohol-povidone (HYPOTEARS) 1.4-0.6 % ophthalmic solution, Place 1-2 drops into both eyes in the morning and at  bedtime., Disp: , Rfl:    potassium chloride SA (KLOR-CON M) 20 MEQ tablet, Take 1 tablet (20 mEq total) by mouth daily with lunch., Disp:  90 tablet, Rfl: 3   rivaroxaban (XARELTO) 20 MG TABS tablet, TAKE 1 TABLET(20 MG) BY MOUTH DAILY WITH SUPPER, Disp: 90 tablet, Rfl: 3   rosuvastatin (CRESTOR) 40 MG tablet, TAKE 1 TABLET(40 MG) BY MOUTH EVERY EVENING, Disp: 90 tablet, Rfl: 3   Simethicone (GAS-X PO), Take by mouth as directed., Disp: , Rfl:       Objective:   Vitals:   02/21/22 1525  BP: 106/62  Pulse: 60  Temp: 97.8 F (36.6 C)  TempSrc: Oral  SpO2: 97%  Weight: 183 lb 6.4 oz (83.2 kg)  Height: 5' 10"$  (1.778 m)    Estimated body mass index is 26.32 kg/m as calculated from the following:   Height as of this encounter: 5' 10"$  (1.778 m).   Weight as of this encounter: 183 lb 6.4 oz (83.2 kg).  @WEIGHTCHANGE$ @  [employer]   02/21/22 1525  Weight: 183 lb 6.4 oz (83.2 kg)     Physical Exam General: No distress. Looks well. Thinne Neuro: Alert and Oriented x 3. GCS 15. Speech normal Psych: Pleasant Resp:  Barrel Chest - no.  Wheeze - no, Crackles - YES BASE, No overt respiratory distress CVS: Normal heart sounds. Murmurs - no Ext: Stigmata of Connective Tissue Disease - no HEENT: Normal upper airway. PEERL +. No post nasal drip        Assessment:       ICD-10-CM   1. IPF (idiopathic pulmonary fibrosis) (HCC)  [id number] Spacer/Aero-Holding Chambers (AEROCHAMBER MV) inhaler    Pulmonary function test    DISCONTINUED: Spacer/Aero-Holding Chambers (AEROCHAMBER MV) inhaler    2. Drug-induced weight loss  R63.4    T50.905A     3. Pulmonary emphysema with fibrosis of lung ([place])  J43.9    J84.10     4. Other fatigue  R53.83          Plan:     Patient Instructions  Pulmnary Fibrosis with emphysema ILD (interstitial lung disease) ([place]) IPF (idiopathic pulmonary fibrosis) ([facility])  - IPF is  stable x 1 year on CT on  low dose ebreit trough progressed in 3  years - -Currently on Esbriet 2 pills 3 times daily   -  being capped because baseline mexilitine can increase esbriet levels  - esbiret contributing to fatigue   Plan - high dose flu sho 11/08/2021 - check LFT 11/08/2021 - continue esbriet 2 pills x 3 times daily  - controlling GI symptoms with omeprazole, miralax, ginger regimen as needed - Continue pulmonary rehab  - appreciate clinical trial interst    - take information on pill study by [employer] and injection sttydy by Celso Amy - spiro/dlco in 3-4 months  Chronic obstructive pulmonary disease, unspecified COPD type ([place])  - not in flare up  Plan  - cotninue breztri daily - albueo as needed - refill clotrimazole for thrush as needed  GERD - appears omeprazole helping you bu t you are worried about kidney side effect  Plan  - stop omeprazole  Weight loss- drug induced Low appetite   -ongoing weight loss  due to esbriet and jardiance  but seems stabilized - currently 180#  Plan  -monitor; ok to tolerate weight loss till 170- 175#   Followup - 3-4 months in 30 min visit after PFT; but can cancel if on ressearch  (Level 04: Estb 30-39 min   visit type: on-site physical face to visit visit spent in total care time and counseling or/and coordination of care by this undersigned MD -  Dr Brand Males. This includes one or more of the following on this same day 02/22/2022: pre-charting, chart review, note writing, documentation discussion of test results, diagnostic or treatment recommendations, prognosis, risks and benefits of management options, instructions, education, compliance or risk-factor reduction. It excludes time spent by the [facility] or office staff in the care of the patient . Actual time is 35 min)   SIGNATURE    Dr. Brand Males, M.D., F.C.C.P,  Pulmonary and Critical Care Medicine Staff Physician, [place] Director - Interstitial Lung Disease  Program  Pulmonary [place] [place], [place], [address]  Pager: [phone], If no answer or between  15:00h - 7:00h: call [phone] Telephone: [phone]  3:46 PM 02/21/2022

## 2022-03-05 NOTE — Progress Notes (Signed)
Remote ICD transmission.   

## 2022-04-17 DIAGNOSIS — R3121 Asymptomatic microscopic hematuria: Secondary | ICD-10-CM | POA: Diagnosis not present

## 2022-04-17 DIAGNOSIS — N200 Calculus of kidney: Secondary | ICD-10-CM | POA: Diagnosis not present

## 2022-04-24 ENCOUNTER — Other Ambulatory Visit: Payer: Self-pay | Admitting: Cardiology

## 2022-04-24 DIAGNOSIS — I255 Ischemic cardiomyopathy: Secondary | ICD-10-CM

## 2022-04-27 ENCOUNTER — Other Ambulatory Visit: Payer: Self-pay | Admitting: Adult Health

## 2022-05-02 ENCOUNTER — Other Ambulatory Visit: Payer: Self-pay | Admitting: Pharmacist

## 2022-05-02 DIAGNOSIS — J84112 Idiopathic pulmonary fibrosis: Secondary | ICD-10-CM

## 2022-05-02 MED ORDER — PIRFENIDONE 267 MG PO TABS: 534.0000 mg | ORAL_TABLET | Freq: Three times a day (TID) | ORAL | 0 refills | Status: DC

## 2022-05-02 NOTE — Telephone Encounter (Signed)
Refill sent for ESBRIET to [facility] ([facility]) for Esbriet: [phone]  Dose: 534 mg three times daily  Last OV: 02/21/22 Provider: Dr. Marchelle Gearing  Next OV: not scheduled yet  LFTs on 11/22/21 wnl  Chesley Mires, PharmD, MPH, BCPS Clinical Pharmacist (Rheumatology and Pulmonology)

## 2022-05-15 DIAGNOSIS — H26492 Other secondary cataract, left eye: Secondary | ICD-10-CM | POA: Diagnosis not present

## 2022-05-15 DIAGNOSIS — Z961 Presence of intraocular lens: Secondary | ICD-10-CM | POA: Diagnosis not present

## 2022-05-15 DIAGNOSIS — H2511 Age-related nuclear cataract, right eye: Secondary | ICD-10-CM | POA: Diagnosis not present

## 2022-05-17 ENCOUNTER — Ambulatory Visit (INDEPENDENT_AMBULATORY_CARE_PROVIDER_SITE_OTHER): Payer: Medicare Other

## 2022-05-17 DIAGNOSIS — I255 Ischemic cardiomyopathy: Secondary | ICD-10-CM | POA: Diagnosis not present

## 2022-05-17 LAB — CUP PACEART REMOTE DEVICE CHECK
Battery Remaining Longevity: 102 mo
Battery Remaining Percentage: 100 %
Brady Statistic RA Percent Paced: 0 %
Brady Statistic RV Percent Paced: 95 %
Date Time Interrogation Session: 20240503012200
HighPow Impedance: 42 Ohm
Implantable Lead Connection Status: 753985
Implantable Lead Connection Status: 753985
Implantable Lead Connection Status: 753985
Implantable Lead Implant Date: 20020527
Implantable Lead Implant Date: 20020527
Implantable Lead Implant Date: 20131212
Implantable Lead Location: 753858
Implantable Lead Location: 753859
Implantable Lead Location: 753860
Implantable Lead Model: 148
Implantable Lead Model: 4194
Implantable Lead Model: 6940
Implantable Lead Serial Number: 120244
Implantable Pulse Generator Implant Date: 20220131
Lead Channel Impedance Value: 654 Ohm
Lead Channel Impedance Value: 667 Ohm
Lead Channel Setting Pacing Amplitude: 2.5 V
Lead Channel Setting Pacing Amplitude: 2.5 V
Lead Channel Setting Pacing Pulse Width: 0.4 ms
Lead Channel Setting Pacing Pulse Width: 1 ms
Lead Channel Setting Sensing Sensitivity: 0.6 mV
Lead Channel Setting Sensing Sensitivity: 1 mV
Pulse Gen Serial Number: 149562

## 2022-05-23 ENCOUNTER — Ambulatory Visit (INDEPENDENT_AMBULATORY_CARE_PROVIDER_SITE_OTHER): Payer: Medicare Other | Admitting: Family Medicine

## 2022-05-23 ENCOUNTER — Encounter: Payer: Self-pay | Admitting: Family Medicine

## 2022-05-23 VITALS — BP 102/62 | HR 71 | Temp 97.8°F | Resp 19 | Ht 70.0 in | Wt 184.4 lb

## 2022-05-23 DIAGNOSIS — B351 Tinea unguium: Secondary | ICD-10-CM

## 2022-05-23 DIAGNOSIS — E7800 Pure hypercholesterolemia, unspecified: Secondary | ICD-10-CM | POA: Diagnosis not present

## 2022-05-23 DIAGNOSIS — W57XXXA Bitten or stung by nonvenomous insect and other nonvenomous arthropods, initial encounter: Secondary | ICD-10-CM

## 2022-05-23 DIAGNOSIS — S30860A Insect bite (nonvenomous) of lower back and pelvis, initial encounter: Secondary | ICD-10-CM

## 2022-05-23 DIAGNOSIS — I10 Essential (primary) hypertension: Secondary | ICD-10-CM

## 2022-05-23 LAB — LIPID PANEL
Cholesterol: 143 mg/dL (ref 0–200)
HDL: 37.7 mg/dL — ABNORMAL LOW (ref >39.00)
LDL Cholesterol: 86 mg/dL (ref 0–99)
NonHDL: 104.9
Total CHOL/HDL Ratio: 4
Triglycerides: 93 mg/dL (ref 0.0–149.0)
VLDL: 18.6 mg/dL (ref 0.0–40.0)

## 2022-05-23 LAB — CBC WITH DIFFERENTIAL/PLATELET
Basophils Absolute: 0.1 10*3/uL (ref 0.0–0.1)
Basophils Relative: 0.8 % (ref 0.0–3.0)
Eosinophils Absolute: 0.5 10*3/uL (ref 0.0–0.7)
Eosinophils Relative: 6.2 % — ABNORMAL HIGH (ref 0.0–5.0)
HCT: 48.2 % (ref 39.0–52.0)
Hemoglobin: 16.1 g/dL (ref 13.0–17.0)
Lymphocytes Relative: 24 % (ref 12.0–46.0)
Lymphs Abs: 1.8 10*3/uL (ref 0.7–4.0)
MCHC: 33.5 g/dL (ref 30.0–36.0)
MCV: 104.6 fl — ABNORMAL HIGH (ref 78.0–100.0)
Monocytes Absolute: 0.6 10*3/uL (ref 0.1–1.0)
Monocytes Relative: 7.6 % (ref 3.0–12.0)
Neutro Abs: 4.7 10*3/uL (ref 1.4–7.7)
Neutrophils Relative %: 61.4 % (ref 43.0–77.0)
Platelets: 292 10*3/uL (ref 150.0–400.0)
RBC: 4.61 Mil/uL (ref 4.22–5.81)
RDW: 14.3 % (ref 11.5–15.5)
WBC: 7.7 10*3/uL (ref 4.0–10.5)

## 2022-05-23 LAB — BASIC METABOLIC PANEL
BUN: 17 mg/dL (ref 6–23)
CO2: 27 mEq/L (ref 19–32)
Calcium: 9 mg/dL (ref 8.4–10.5)
Chloride: 102 mEq/L (ref 96–112)
Creatinine, Ser: 0.86 mg/dL (ref 0.40–1.50)
GFR: 81.69 mL/min (ref >60.00)
Glucose, Bld: 88 mg/dL (ref 70–99)
Potassium: 4.9 mEq/L (ref 3.5–5.1)
Sodium: 136 mEq/L (ref 135–145)

## 2022-05-23 LAB — HEPATIC FUNCTION PANEL
ALT: 22 U/L (ref 0–53)
AST: 25 U/L (ref 0–37)
Albumin: 3.8 g/dL (ref 3.5–5.2)
Alkaline Phosphatase: 53 U/L (ref 39–117)
Bilirubin, Direct: 0.2 mg/dL (ref 0.0–0.3)
Total Bilirubin: 0.7 mg/dL (ref 0.2–1.2)
Total Protein: 6.8 g/dL (ref 6.0–8.3)

## 2022-05-23 LAB — TSH: TSH: 1.82 u[IU]/mL (ref 0.35–5.50)

## 2022-05-23 MED ORDER — CICLOPIROX 8 % EX SOLN: Freq: Every day | CUTANEOUS | 0 refills | Status: DC

## 2022-05-23 NOTE — Patient Instructions (Signed)
Follow up in 6 months to recheck BP and cholesterol We'll notify you of your lab results and make any changes if needed START the Ciclopirox as directed for the nails fungus The tick bite looks fine- no evidence of infection Call with any questions or concerns Stay Safe!  Stay Healthy! Have a great summer!!!

## 2022-05-23 NOTE — Progress Notes (Signed)
   Subjective:    Patient ID: [name], male    DOB: [birth year], 82 y.o.   MRN: [id number]  HPI HTN- chronic problem.  On multiple medications for heart failure.  Coreg 3.125mg  BID, Entresto 24/26mg  BID, Eplerenone 25mg  daily, Lasix 40mg  daily, Mexiletine 300mg  BID.  Denies CP, SOB above baseline, edema  Hyperlipidemia- chronic problem, on Crestor 40mg  daily.  Denies abd pain, N/V, myalgias  Toenail fungus- L great toenail.  Using Fungi nail w/o results.  Would like to try something different.  Tick bite- he reports wife removed 3 ticks at base of neck.  Area is now scabbed.  No rashes.  No HA's.       Review of Systems For ROS see HPI     Objective:   Physical Exam Vitals reviewed.  Constitutional:      General: He is not in acute distress.    Appearance: He is well-developed.  HENT:     Head: Normocephalic and atraumatic.  Eyes:     Extraocular Movements: Extraocular movements intact.     Conjunctiva/sclera: Conjunctivae normal.     Pupils: Pupils are equal, round, and reactive to light.  Neck:     Thyroid: No thyromegaly.  Cardiovascular:     Rate and Rhythm: Normal rate. Rhythm irregular.     Pulses: Normal pulses.     Heart sounds: Normal heart sounds.  Pulmonary:     Effort: Pulmonary effort is normal. No respiratory distress.     Breath sounds: Normal breath sounds.  Abdominal:     General: Bowel sounds are normal. There is no distension.     Palpations: Abdomen is soft.  Musculoskeletal:     Cervical back: Normal range of motion and neck supple.     Right lower leg: No edema.     Left lower leg: No edema.  Lymphadenopathy:     Cervical: No cervical adenopathy.  Skin:    General: Skin is warm and dry.     Comments: Fungus of L great toenail  Scabbed lesion at site of tick bite at base of neck.  No swelling or induration, no rash present  Neurological:     General: No focal deficit present.     Mental Status: He is alert and oriented to person, place,  and time.     Cranial Nerves: No cranial nerve deficit.  Psychiatric:        Mood and Affect: Mood normal.        Behavior: Behavior normal.           Assessment & Plan:  Nail fungus- new to provider, ongoing for pt.  No relief w/ OTC Funginail.  Start topical ciclopirox.  Pt expressed understanding and is in agreement w/ plan.   Tick bite- new.  Area is healing well, is currently scabbed.  No signs of infection.  No evidence of tick borne illness.  Reassurance provided.

## 2022-05-27 ENCOUNTER — Telehealth: Payer: Self-pay

## 2022-05-27 NOTE — Telephone Encounter (Signed)
Pt aware of lab results 

## 2022-05-27 NOTE — Telephone Encounter (Signed)
-----   Message from Sheliah Hatch, MD sent at 05/27/2022  8:38 AM EDT ----- Labs look good!  No changes at this time

## 2022-05-28 NOTE — Assessment & Plan Note (Signed)
Chronic problem.  On Crestor 40mg daily w/o difficulty.  Check labs.  Adjust meds prn  ?

## 2022-05-28 NOTE — Assessment & Plan Note (Signed)
Chronic problem.  Currently well controlled on Coreg 3.125mg  BID, Entresto 24/26mg  BID, Eplerenone 25mg  daily, lasix 40mg  daily, Mexiletine 300mg  BID.  Currently asymptomatic.  Check labs.  No anticipated med changes.

## 2022-06-04 NOTE — Progress Notes (Unsigned)
Cardiology Office Note Date:  06/04/2022  Patient ID:  Joel, [name] Apr 10, 1940, MRN [id number] PCP:  Sheliah Hatch, MD  Cardiologist:  Dr. Jens [name] Electrophysiologist: Dr. Graciela [name]    Chief Complaint:  *** anual visit  History of Present Illness: [name] is a 82 y.o. male with history of CAD (cardiac catheterization in October 2008 showed an occluded right coronary artery with left-to-right collateralization), chronic CHF (systolic), mixed ischemi/NICM, VT, AFib (permanent), COPD, pulmonary fibrosis, HTN, HLD.  He saw Dr. Graciela [name] 06/06/21, discussed dx of interstitial pulmonary fibrosis with a prognosis that he understands to be in the 3 year range. The medications making him feel terrible. Because of worsening shortness of breath he was referred for an echocardiogram which demonstrated worsening MR and LA enlargement although LV sizes were somewhat smaller, weight was down. No VT Described his Afib as permanent  He has seen Dr. Jens [name] a couple times, last was 12/19/21, chest CT September 2023 showed fibrosis in a pattern suggestive of UIP.  Since last seen, he does have dyspnea on exertion that he attributes to pulmonary fibrosis.  No orthopnea, PND, pedal edema, chest pain or syncope.  He has lost significant amounts of weight as he has decreased appetite with esbriet. Volume stable, planned for echo in 2024 discussed possible mitraclip if HF gets worse  Saw pulm 02/21/22, discussed ongoing weight loss and strategies/etiologies, any further weight loss a-> then we would have to stop esbriet or jardiance ((likely esbriet) for a 1-2 months and then see if weight goes up.He does not like the idea of stopping esbriet. So, we diuscussed calorie count and improving nutrition   *** VT, device *** perm AF *** xarelto, bleeding, dose, labs *** rates, BP % *** volume *** sees [name] August   Device information BSCi CRT-D 12/20/2009 LV lead extraction >> new implant in 2013 Gen  change 02/14/20 RA/RV leads are from 2002  AAD Mexiletine 2009 >> current Amiodarone d/c March 2019 2/2 pulmonary concerns/potential toxicity High resolution chest CT February 2020 showed mild pulmonary fibrosis, mild emphysema   Past Medical History:  Diagnosis Date   Adenomatous colon polyp 2009; 06/2012   Colonoscopy 07/2007---Dr. Maryjane [name]--Repeat 06/2012 showed one tubular adenoma w/out high grade dysplasia.   AICD (automatic cardioverter/defibrillator) present    Environmental manager   Atrial fibrillation (HCC)    Bilateral sensorineural hearing loss    BPH (benign prostatic hypertrophy)    PSAs ok (followed by urologist): no LUTS   CAD (coronary artery disease)    PTCA RCA 1997 (Inf/post MI);   CHF (congestive heart failure) (HCC)    Chronic constipation    Chronic rhinitis    ?vasomotor (Dr. Lazarus [name])   COPD (chronic obstructive pulmonary disease) (H[facility])    Diverticulosis of colon 2005   Noted on Colonoscopies in 2005 and 2009 and on CT 2010   Gallstones 06/2014   No evidence of cholecystitis on abd u/s 06/2014   GERD (gastroesophageal reflux disease)    Hepatic steatosis 2010; 2016   Noted on CT abd 2010 and on u/s abd 04/2009 (transaminasemia) and u/s 06/2014.   Hyperlipidemia    Hypertension    Ischemic cardiomyopathy    Microscopic hematuria    Cysto neg 02/2009 except for BPH-likely has microhem from his renal stone dz   Nephrolithiasis    No distal stones on CT 2010; stable 8 mm RLP stone, 3mm LMP stone 10/2013 plain film.  Abd u/s 06/2014: 9 mm nonobstructing  lower pole right renal stone.  03/2015 urol: stable nonobstructing renal calc by CT 12/2014 and KUB 03/2015.   Obstructive sleep apnea    no cpap uses since weight loss of 50 pounds   Presence of permanent cardiac pacemaker    [employer]   Squamous cell carcinoma of back    Ventricular tachycardia [facility])     Past Surgical History:  Procedure Laterality Date   ATRIAL ABLATION SURGERY     flutter  ablation 2002   BREAST SURGERY Left 1990   "Tissue removed from breast"   CARDIAC CATHETERIZATION     CARDIOVERSION N/A 05/10/2020   Procedure: CARDIOVERSION;  Surgeon: Joel Fair, MD;  Location: [facility] ENDOSCOPY;  Service: Cardiovascular;  Laterality: N/A;   carotid doppler  09/25/12   No signif stenosis   COLONOSCOPY N/A 07/13/2012   Diverticula ascending colon.  Polyp x 1-recall 5 yrs (Dr. Ewing [name]) Procedure: COLONOSCOPY;  Surgeon: Joel Kuba, MD;  Location: [facility] ENDOSCOPY;  Service: Endoscopy;  Laterality: N/A; polypectomy x 1    COLONOSCOPY WITH PROPOFOL N/A 08/18/2015   Procedure: COLONOSCOPY WITH PROPOFOL;  Surgeon: Joel Form, MD;  Location: [facility] ENDOSCOPY;  Service: Endoscopy;  Laterality: N/A;   ESOPHAGOGASTRODUODENOSCOPY N/A 07/13/2012   Procedure: ESOPHAGOGASTRODUODENOSCOPY (EGD);  Surgeon: Joel Kuba, MD;  Location: [facility];  Service: Endoscopy;  Laterality: N/A;  NORMAL   EXTRACORPOREAL SHOCK WAVE LITHOTRIPSY     HEMORRHOID BANDING     ICD     a) Guidant Contak H170- Joel Manges, MD 07/25/06 (second device) b) Medtronic CRT-D   ICD GENERATOR CHANGEOUT N/A 02/14/2020   Procedure: ICD GENERATOR CHANGEOUT;  Surgeon: Joel Salvia, MD;  [name];  Service: Cardiovascular;  Laterality: N/A;   INGUINAL HERNIA REPAIR Bilateral 1975   LEAD REVISION Bilateral 12/26/2011   Procedure: LEAD REVISION;  Surgeon: Joel Maw, MD;  Location: [facility] CATH LAB;  Service: Cardiovascular;  Laterality: Bilateral;   PFTs  02/2015   Mod restriction (interstitial), mod diffusion defect (Dr. Maple [name])   precancer area keratsis removed  05/2015    leftf orehead and left arm and right arm and back    Current Outpatient Medications  Medication Sig Dispense Refill   antiseptic oral rinse (BIOTENE) LIQD 15 mLs by Mouth Rinse route in the morning and at bedtime.     BREZTRI AEROSPHERE 160-9-4.8 MCG/ACT AERO INHALE 2 PUFFS INTO THE LUNGS IN THE MORNING AND AT BEDTIME 10.7 g 6    carvedilol (COREG) 3.125 MG tablet TAKE 1 TABLET(3.125 MG) BY MOUTH TWICE DAILY WITH A MEAL 180 tablet 3   cholecalciferol (VITAMIN D) 1000 units tablet Take 1,000 Units by mouth every evening.     ciclopirox (PENLAC) 8 % solution Apply topically at bedtime. Apply over nail and surrounding skin. Apply daily over previous coat. After seven (7) days, may remove with alcohol and continue cycle. 6.6 mL 0   clotrimazole (MYCELEX) 10 MG troche Dissolve, swish and swallow one tablet by mouth 5 times daily for 14 days. 70 tablet 1   ENTRESTO 24-26 MG TAKE 1 TABLET BY MOUTH TWICE DAILY 180 tablet 3   eplerenone (INSPRA) 25 MG tablet TAKE 1 TABLET(25 MG) BY MOUTH DAILY 90 tablet 3   furosemide (LASIX) 40 MG tablet Take 1 tablet (40 mg total) by mouth daily. 90 tablet 3   Ginger, Zingiber officinalis, (GINGER ROOT) 550 MG CAPS Take 1 capsule by mouth daily.     ipratropium (ATROVENT) 0.06 % nasal spray  Place 2 sprays into both nostrils 3 (three) times daily. 15 mL 1   JARDIANCE 10 MG TABS tablet TAKE 1 TABLET(10 MG) BY MOUTH DAILY BEFORE BREAKFAST 90 tablet 3   levalbuterol (XOPENEX HFA) 45 MCG/ACT inhaler INHALE 2 PUFFS INTO THE LUNGS EVERY 4 HOURS AS NEEDED FOR WHEEZING 15 g 6   loratadine (CLARITIN) 10 MG tablet Take 10 mg by mouth in the morning.     Magnesium 250 MG TABS Take 250 mg by mouth every evening.     mexiletine (MEXITIL) 150 MG capsule Take 2 capsules (300 mg total) by mouth 2 (two) times daily. 360 capsule 2   Multiple Vitamin (MULTIVITAMIN WITH MINERALS) TABS Take 1 tablet by mouth every evening.     nitroGLYCERIN (NITROSTAT) 0.4 MG SL tablet DISSOLVE 1 TABLET UNDER THE TONGUE EVERY 5 MINUTES AS NEEDED FOR CHEST PAIN 25 tablet 5   Olopatadine HCl (PATADAY OP) Apply 1 drop to eye daily.     Pirfenidone (ESBRIET) 267 MG TABS Take 2 tablets (534 mg total) by mouth with breakfast, with lunch, and with evening meal. 540 tablet 0   polyvinyl alcohol-povidone (HYPOTEARS) 1.4-0.6 % ophthalmic  solution Place 1-2 drops into both eyes in the morning and at bedtime.     potassium chloride SA (KLOR-CON M) 20 MEQ tablet Take 1 tablet (20 mEq total) by mouth daily with lunch. 90 tablet 3   rivaroxaban (XARELTO) 20 MG TABS tablet TAKE 1 TABLET(20 MG) BY MOUTH DAILY WITH SUPPER 90 tablet 3   rosuvastatin (CRESTOR) 40 MG tablet TAKE 1 TABLET(40 MG) BY MOUTH EVERY EVENING 90 tablet 3   Spacer/Aero-Holding Chambers (AEROCHAMBER MV) inhaler Use as instructed 1 each 0   No current facility-administered medications for this visit.    Allergies:   Sulfa antibiotics   Social History:  The patient  reports that he quit smoking about 42 years ago. His smoking use included cigarettes. He has a 30.00 pack-year smoking history. He has never used smokeless tobacco. He reports that he does not drink alcohol and does not use drugs.   Family History:  The patient's family history includes Breast cancer in his maternal grandmother; Coronary artery disease in his father; Gallstones in his mother; Goiter in his paternal grandmother; Heart attack in his father; Heart disease in his mother; Hypertension in his sister; Nephrolithiasis in his father and son; Other in his mother.  ROS:  Please see the history of present illness.    All other systems are reviewed and otherwise negative.   PHYSICAL EXAM:  VS:  There were no vitals taken for this visit. BMI: There is no height or weight on file to calculate BMI. Well nourished, well developed, in no acute distress HEENT: normocephalic, atraumatic Neck: no JVD, carotid bruits or masses Cardiac: *** RRR (paced); no significant murmurs, no rubs, or gallops Lungs:  *** CTA b/l, no wheezing, rhonchi or rales Abd: soft, nontender MS: no deformity or atrophy Ext: *** no edema Skin: warm and dry, no rash Neuro:  No gross deficits appreciated Psych: euthymic mood, full affect  *** ICD site is stable, no tethering or discomfort   EKG:  not done today  Device  interrogation done today and reviewed by myself:  ***   05/28/21: TTE 1. Left ventricular ejection fraction, by estimation, is 30 to 35%. The  left ventricle has moderately decreased function. The left ventricle  demonstrates regional wall motion abnormalities (see scoring  diagram/findings for description). Left ventricular   diastolic parameters  are consistent with Grade I diastolic dysfunction  (impaired relaxation).   2. Right ventricular systolic function is normal. The right ventricular  size is normal.   3. Left atrial size was severely dilated.   4. PISA 0.9 cm      MR Vol 36 ml      ERO 0.25 cm2. The mitral valve is normal in structure. Moderate to  severe mitral valve regurgitation. No evidence of mitral stenosis.   5. Tricuspid valve regurgitation is moderate.   6. The aortic valve is normal in structure. Aortic valve regurgitation is  not visualized. No aortic stenosis is present.   7. The inferior vena cava is normal in size with greater than 50%  respiratory variability, suggesting right atrial pressure of 3 mmHg.   Comparison(s): No significant change from prior study. Prior images  reviewed side by side. EF 30%, lateral and inferior wall are akinetic,  anterior, septum and apex all hypokinetic, mild MR.    04/18/20: stress myoview The left ventricular ejection fraction is severely decreased (<30%). Nuclear stress EF: 16%. No T wave inversion was noted during stress. There was no ST segment deviation noted during stress. Defect 1: There is a large defect of severe severity present in the basal inferoseptal, basal inferior, basal inferolateral, mid inferoseptal, mid inferior, mid inferolateral, apical anterior, apical septal, apical inferior and apical lateral location. Findings consistent with prior myocardial infarction. This is a high risk study.   Large size, severe severity fixed inferior and lateral perfusion defect, suggestive of large area of scar without  significant peri-infarct ischemia (SSS 46, SRS 44, SDS 2) - extent 59% of myocardium. LVEF 16% with inferior, inferoseptal, apical and lateral wall akinesis. This is a high risk study. Prior study in 2011 reported LVEF 20% with large inferior defect.  07/02/2019: TTE IMPRESSIONS  1. Left ventricular ejection fraction, by estimation, is 30 to 35%. The  left ventricle has moderately decreased function. The left ventricle  demonstrates regional wall motion abnormalities (see scoring  diagram/findings for description). The left  ventricular internal cavity size was moderately dilated. Left ventricular  diastolic parameters are consistent with Grade I diastolic dysfunction  (impaired relaxation).   2. Right ventricular systolic function is normal. The right ventricular  size is normal. There is normal pulmonary artery systolic pressure.   3. Left atrial size was moderately dilated.   4. The mitral valve is normal in structure. Mild mitral valve  regurgitation. No evidence of mitral stenosis.   5. The aortic valve is normal in structure. Aortic valve regurgitation is  not visualized. No aortic stenosis is present.   6. The inferior vena cava is normal in size with greater than 50%  respiratory variability, suggesting right atrial pressure of 3 mmHg.   Comparison(s): EF 20-25%.    12/27/2011 This demonstrates successful extraction of an old left ventricular lead, which had retracted back into the subclavian vein.  It demonstrates successful insertion of a new left ventricular lead into the lateral vein of the left ventricle and successful pocket revision and contrast venography all without immediate procedure complication.   Recent Labs: 01/09/2022: NT-Pro BNP 715 05/23/2022: ALT 22; BUN 17; Creatinine, Ser 0.86; Hemoglobin 16.1; Platelets 292.0; Potassium 4.9; Sodium 136; TSH 1.82  05/23/2022: Cholesterol 143; HDL 37.70; LDL Cholesterol 86; Total CHOL/HDL Ratio 4; Triglycerides 93.0; VLDL  18.6   Estimated Creatinine Clearance: 70.7 mL/min (by C-G formula based on SCr of 0.86 mg/dL).   Wt Readings from Last 3 Encounters:  05/23/22  184 lb 6 oz (83.6 kg)  02/21/22 183 lb 6.4 oz (83.2 kg)  12/19/21 181 lb 12.8 oz (82.5 kg)     Other studies reviewed: Additional studies/records reviewed today include: summarized above  ASSESSMENT AND PLAN:  1. CRT-D     *** intact function     *** no changes made  2. CAD     *** No anginal symptoms     *** On BB, statin, no ASA w/xarelto     C/w Dr. Jens [name]  3. Chronic CHF 4. Mixed ischemic/NICM     *** No symptoms or exam findings to suggest folume OL     ***     *** On BB, entresto, lasix, Inspra, K+  5. Persistent Afib     CHA2DS2Vasc is 5, on xarelto, *** appropriately dosed     historically failed amio with pulmonary tox     *** %  burden   7. VT     *** On mexiletine     *** None noted  8. HTN     ***Looks good    Disposition: ***  Current medicines are reviewed at length with the patient today.  The patient did not have any concerns regarding medicines.  Norma Fredrickson, PA-C 06/04/2022 7:16 AM     [facility] [address] [place] [address] [phone] (office)  [phone] (fax)

## 2022-06-05 DIAGNOSIS — Z85828 Personal history of other malignant neoplasm of skin: Secondary | ICD-10-CM | POA: Diagnosis not present

## 2022-06-05 DIAGNOSIS — L570 Actinic keratosis: Secondary | ICD-10-CM | POA: Diagnosis not present

## 2022-06-05 DIAGNOSIS — D1801 Hemangioma of skin and subcutaneous tissue: Secondary | ICD-10-CM | POA: Diagnosis not present

## 2022-06-05 DIAGNOSIS — L821 Other seborrheic keratosis: Secondary | ICD-10-CM | POA: Diagnosis not present

## 2022-06-05 DIAGNOSIS — L812 Freckles: Secondary | ICD-10-CM | POA: Diagnosis not present

## 2022-06-05 NOTE — Progress Notes (Signed)
Remote ICD transmission.   

## 2022-06-06 ENCOUNTER — Encounter: Payer: Self-pay | Admitting: Physician Assistant

## 2022-06-06 ENCOUNTER — Ambulatory Visit: Payer: Medicare Other | Attending: Physician Assistant | Admitting: Physician Assistant

## 2022-06-06 VITALS — BP 100/60 | HR 66 | Ht 70.0 in | Wt 185.4 lb

## 2022-06-06 DIAGNOSIS — I428 Other cardiomyopathies: Secondary | ICD-10-CM | POA: Diagnosis not present

## 2022-06-06 DIAGNOSIS — I255 Ischemic cardiomyopathy: Secondary | ICD-10-CM | POA: Diagnosis not present

## 2022-06-06 DIAGNOSIS — Z95810 Presence of automatic (implantable) cardiac defibrillator: Secondary | ICD-10-CM | POA: Insufficient documentation

## 2022-06-06 DIAGNOSIS — I472 Ventricular tachycardia, unspecified: Secondary | ICD-10-CM | POA: Diagnosis not present

## 2022-06-06 DIAGNOSIS — I4821 Permanent atrial fibrillation: Secondary | ICD-10-CM

## 2022-06-06 DIAGNOSIS — I5022 Chronic systolic (congestive) heart failure: Secondary | ICD-10-CM | POA: Diagnosis not present

## 2022-06-06 DIAGNOSIS — I2510 Atherosclerotic heart disease of native coronary artery without angina pectoris: Secondary | ICD-10-CM | POA: Diagnosis not present

## 2022-06-06 DIAGNOSIS — D6869 Other thrombophilia: Secondary | ICD-10-CM | POA: Diagnosis not present

## 2022-06-06 LAB — CUP PACEART INCLINIC DEVICE CHECK
Date Time Interrogation Session: 20240523124933
HighPow Impedance: 43 Ohm
Implantable Lead Connection Status: 753985
Implantable Lead Connection Status: 753985
Implantable Lead Connection Status: 753985
Implantable Lead Implant Date: 20020527
Implantable Lead Implant Date: 20020527
Implantable Lead Implant Date: 20131212
Implantable Lead Location: 753858
Implantable Lead Location: 753859
Implantable Lead Location: 753860
Implantable Lead Model: 148
Implantable Lead Model: 4194
Implantable Lead Model: 6940
Implantable Lead Serial Number: 120244
Implantable Pulse Generator Implant Date: 20220131
Lead Channel Impedance Value: 577 Ohm
Lead Channel Impedance Value: 653 Ohm
Lead Channel Impedance Value: 659 Ohm
Lead Channel Pacing Threshold Amplitude: 0.8 V
Lead Channel Pacing Threshold Amplitude: 0.9 V
Lead Channel Pacing Threshold Pulse Width: 0.4 ms
Lead Channel Pacing Threshold Pulse Width: 1 ms
Lead Channel Setting Pacing Amplitude: 2.5 V
Lead Channel Setting Pacing Amplitude: 2.5 V
Lead Channel Setting Pacing Pulse Width: 0.4 ms
Lead Channel Setting Pacing Pulse Width: 1 ms
Lead Channel Setting Sensing Sensitivity: 0.6 mV
Lead Channel Setting Sensing Sensitivity: 1 mV
Pulse Gen Serial Number: 149562

## 2022-06-06 NOTE — Patient Instructions (Signed)
Medication Instructions:   Your physician recommends that you continue on your current medications as directed. Please refer to the Current Medication list given to you today.  *If you need a refill on your cardiac medications before your next appointment, please call your pharmacy*   Lab Work: NONE ORDERED  TODAY   If you have labs (blood work) drawn today and your tests are completely normal, you will receive your results only by: MyChart Message (if you have MyChart) OR A paper copy in the mail If you have any lab test that is abnormal or we need to change your treatment, we will call you to review the results.   Testing/Procedures: NONE ORDERED  TODAY   Follow-Up: At [facility], you and your health needs are our priority.  As part of our continuing mission to provide you with exceptional heart care, we have created designated Provider Care Teams.  These Care Teams include your primary Cardiologist (physician) and Advanced Practice Providers (APPs -  Physician Assistants and Nurse Practitioners) who all work together to provide you with the care you need, when you need it.  We recommend signing up for the patient portal called "MyChart".  Sign up information is provided on this After Visit Summary.  MyChart is used to connect with patients for Virtual Visits (Telemedicine).  Patients are able to view lab/test results, encounter notes, upcoming appointments, etc.  Non-urgent messages can be sent to your provider as well.   To learn more about what you can do with MyChart, go to [email or web address].    Your next appointment:   1 year(s)  Provider:   Steven Klein, MD    Other Instructions  

## 2022-07-02 ENCOUNTER — Other Ambulatory Visit: Payer: Self-pay | Admitting: Family Medicine

## 2022-07-03 DIAGNOSIS — H04123 Dry eye syndrome of bilateral lacrimal glands: Secondary | ICD-10-CM | POA: Diagnosis not present

## 2022-07-03 DIAGNOSIS — H1132 Conjunctival hemorrhage, left eye: Secondary | ICD-10-CM | POA: Diagnosis not present

## 2022-07-15 ENCOUNTER — Telehealth: Payer: Self-pay

## 2022-07-15 DIAGNOSIS — I4821 Permanent atrial fibrillation: Secondary | ICD-10-CM

## 2022-07-15 MED ORDER — CARVEDILOL 3.125 MG PO TABS
ORAL_TABLET | ORAL | 3 refills | Status: DC
Start: 2022-07-15 — End: 2022-12-01

## 2022-07-15 MED ORDER — CARVEDILOL 3.125 MG PO TABS
ORAL_TABLET | ORAL | 3 refills | Status: DC
Start: 2022-07-15 — End: 2022-07-15

## 2022-07-15 NOTE — Telephone Encounter (Signed)
Following alert received from [employer] received for (ATP) therapy delivered to convert arrhythmia. Event occurred 6/28 @ 06:56 EGM shows onset of sustained VT, HR 164, ATP delivered x1 converting arrhythmia to regular BiV pace.  There is a second event 6/28 @ 07:01, sustained VT, HR 164, ATP x1 converting to regular BiV pace. There is a third event 6/29 @ 05:22 and a fourth event @ 05:40, both showing sustained VT, rate 161 & 163, both resolved with 1 burst of ATP. There are an additional 15 NSVT between 6/26 @ 20:11 and 6/29 @ 09:36.  Patient has hx of permanent AF, V-V interval appears regular and marches out.   Patient denies any symptoms and stated he surprised I am calling. States he has felt well other than dealing with his pulmonary fibrosis. Denies any swelling in lower extremities or weight gain. BP has been WNL per patient. Patient reports compliance with all medications on file including coreg 3.125 mg BID, Mexilentine 300 mg BID, Entresto 24-126 mg BID, Lasix 40 mg daily, Xarelto 20 mg daily. Heart logic score 0. Advised patient I will forward to MD & PA for review.   Patient advised to call if any s/s or new/concerning symptoms arise. Shock plan/[place] DMV driving restrictions x6 months reviewed with patient with verbal understanding.

## 2022-07-15 NOTE — Addendum Note (Signed)
Addended by: Unk Lightning T on: 07/15/2022 12:59 PM   Modules accepted: Orders

## 2022-07-15 NOTE — Telephone Encounter (Signed)
Patient called advised to take Coreg 3.125 mg in AM and Coreg 3.25 mg in PM. Patient voiced understanding. MAR updated. Advised someone from scheduling will contact him for apt.

## 2022-07-16 ENCOUNTER — Encounter: Payer: Self-pay | Admitting: Internal Medicine

## 2022-07-16 ENCOUNTER — Ambulatory Visit (INDEPENDENT_AMBULATORY_CARE_PROVIDER_SITE_OTHER): Payer: Medicare Other | Admitting: Internal Medicine

## 2022-07-16 VITALS — BP 96/60 | HR 60 | Ht 70.0 in | Wt 191.4 lb

## 2022-07-16 DIAGNOSIS — J849 Interstitial pulmonary disease, unspecified: Secondary | ICD-10-CM | POA: Diagnosis not present

## 2022-07-16 DIAGNOSIS — J84112 Idiopathic pulmonary fibrosis: Secondary | ICD-10-CM

## 2022-07-16 LAB — PULMONARY FUNCTION TEST
DL/VA % pred: 99 %
DL/VA: 3.87 ml/min/mmHg/L
DLCO cor % pred: 55 %
DLCO cor: 13.5 ml/min/mmHg
DLCO unc % pred: 57 %
DLCO unc: 14.04 ml/min/mmHg
FEF 25-75 Pre: 2.54 L/sec
FEF2575-%Pred-Pre: 128 %
FEV1-%Pred-Pre: 66 %
FEV1-Pre: 1.92 L
FEV1FVC-%Pred-Pre: 117 %
FEV6-%Pred-Pre: 60 %
FEV6-Pre: 2.27 L
FEV6FVC-%Pred-Pre: 106 %
FVC-%Pred-Pre: 56 %
FVC-Pre: 2.28 L
Pre FEV1/FVC ratio: 84 %
Pre FEV6/FVC Ratio: 99 %

## 2022-07-16 MED ORDER — SPIRIVA RESPIMAT 1.25 MCG/ACT IN AERS
2.0000 | INHALATION_SPRAY | Freq: Every day | RESPIRATORY_TRACT | 5 refills | Status: DC
Start: 2022-07-16 — End: 2022-11-24

## 2022-07-16 NOTE — Progress Notes (Signed)
Spiro/DLCO performed today.  

## 2022-07-16 NOTE — Patient Instructions (Signed)
Spiro/DLCO performed today.  

## 2022-07-16 NOTE — Progress Notes (Signed)
OV 10/05/2020 -transfer of care to the ILD center with Dr. Marchelle Gearing.  Patient being referred by Dr. Levy Pupa  Subjective:  Patient ID: [name], male , DOB: Nov 11, 1940 , age 82 y.o. , MRN: [id number] , ADDRESS: [address] [place] [address] PCP Sheliah Hatch, MD Patient Care Team: Sheliah Hatch, MD as PCP - General (Family Medicine) Jerilee Field, MD as Consulting Physician (Urology) Karie Soda, MD as Consulting Physician (General Surgery) Jens Som Madolyn Frieze, MD as Consulting Physician (Cardiology) Flo Shanks, MD as Consulting Physician (Otolaryngology) Duke Salvia, MD as Consulting Physician (Cardiology) Maris Berger, MD as Consulting Physician (Ophthalmology) Delton Coombes Les Pou, MD as Consulting Physician (Pulmonary Disease) Napoleon Form, MD as Consulting Physician (Gastroenterology) Dahlia Byes, [facility] as Pharmacist (Pharmacist)  This Provider for this visit: Treatment Team:  Attending Provider: Kalman Shan, MD    10/05/2020 -   Chief Complaint  Patient presents with   Follow-up    Pt states that he does have complaints of SOB that can happen at any time but is worse with exertion.     HPI BREVEN BRAATEN 82 y.o. -referred by Dr. Levy Pupa.  History is provided by the patient and also review of the medical records.  Patient tells me he is aware that he has ILD.  He has a significant symptom burden.  He says he is recently been told his ILD is gotten worse.  He was on amiodarone for many years.  Then approximately 18 months ago he stopped it because he got worried about the drug.  Dr. Delton Coombes notes indicate that patient has combined emphysema and interstitial lung disease.  His pulmonary function test profile shows 20% decline in FVC in the last 5 years or so with most of it coming in the last 2 years.  High-resolution CT chest only shows some decline.  High-resolution CT chest personally visualized by me and is  probable UIP.  His serologies are negative.  He is a former smoker having quit over 40 years ago.  He is Caucasian and his age is greater than 79.  Male gender.  He definitely has progressive symptoms.  He really wants a good symptom control.  He also wants understand his disease.  Today most of the focus was spent on helping him understand interstitial lung disease.  I explicitly told him that I would not be able to address symptom burden today.  He will also switch his comprehensive pulmonary care to see Dr. Marchelle Gearing myself.  He has had an MI in the past.  His atrial fibrillation is on Eliquis.  He is no longer on amiodarone.  His walking desaturation test did not show desaturation more than 3 points.   CT Chest data Sept 2022  Narrative & Impression  CLINICAL DATA:  82 year old male with history of COPD. Evaluate for interstitial lung disease.   EXAM: CT CHEST WITHOUT CONTRAST   TECHNIQUE: Multidetector CT imaging of the chest was performed following the standard protocol without intravenous contrast. High resolution imaging of the lungs, as well as inspiratory and expiratory imaging, was performed.   COMPARISON:  Chest CT 02/17/2018.   FINDINGS: Cardiovascular: Heart size is normal. There is no significant pericardial fluid, thickening or pericardial calcification. There is aortic atherosclerosis, as well as atherosclerosis of the great vessels of the mediastinum and the coronary arteries, including calcified atherosclerotic plaque in the left main, left anterior descending, left circumflex and right coronary arteries. Calcifications of the aortic  valve. Curvilinear calcifications associated with the myocardium of the inferior wall of the left ventricle in the mid ventricle and base, likely sequela of prior left circumflex coronary artery territory myocardial infarction(s). Left-sided biventricular pacemaker/AICD with lead tips terminating in the right atrial appendage, right  ventricular apex, and overlying the lateral wall the left ventricle via the coronary sinus and coronary veins.   Mediastinum/Nodes: No pathologically enlarged mediastinal or hilar lymph nodes. Please note that accurate exclusion of hilar adenopathy is limited on noncontrast CT scans. Esophagus is unremarkable in appearance. No axillary lymphadenopathy.   Lungs/Pleura: High-resolution images again demonstrate some patchy areas of ground-glass attenuation, septal thickening, subpleural reticulation, mild traction bronchiectasis and peripheral bronchiolectasis in the lungs bilaterally. No definite honeycombing. These findings have a mild craniocaudal gradient. Inspiratory and expiratory imaging is unremarkable. No acute consolidative airspace disease. No pleural effusions. Chronic elevation of the right hemidiaphragm is again noted. No definite suspicious appearing pulmonary nodules or masses are noted.   Upper Abdomen: Low-attenuation lesions are noted in the left lobe of the liver, incompletely characterized on today's non-contrast CT examination, but similar to the prior study and measuring up to 1.3 cm in diameter in segment 2, statistically likely to represent cysts.   Musculoskeletal: There are no aggressive appearing lytic or blastic lesions noted in the visualized portions of the skeleton.   IMPRESSION: 1. The appearance of the lungs is compatible with interstitial lung disease, categorized as probable usual interstitial pneumonia (UIP) per current ATS guidelines. Findings appear mildly progressive compared to the prior study. 2. Aortic atherosclerosis, in addition to left main and 3 vessel coronary artery disease. Assessment for potential risk factor modification, dietary therapy or pharmacologic therapy may be warranted, if clinically indicated. 3. There are calcifications of the aortic valve. Echocardiographic correlation for evaluation of potential valvular dysfunction  may be warranted if clinically indicated.   Aortic Atherosclerosis (ICD10-I70.0).     Electronically Signed   By: Trudie Reed M.D.   On: 09/22/2020 11:28   No results found   Results for CHA, PRESS (MRN [id number]) as of 10/05/2020 11:51  Ref. Range 11/14/2015 11:52 09/27/2020 12:19 09/27/2020 13:46  Anti Nuclear Antibody (ANA) Latest Ref Range: NEGATIVE  NEG  NEGATIVE  Anti JO-1 Latest Ref Range: 0.0 - 0.9 AI <0.2    Cyclic Citrullin Peptide Ab Latest Units: UNITS <16  <16  ds DNA Ab Latest Units: IU/mL 1    RA Latex Turbid. Latest Ref Range: <14 IU/mL <14 <14 <14  ENA SM Ab Ser-aCnc Latest Ref Range: <1.0 NEG AI  <1.0 NEG    ANA,IFA RA DIAG PNL W/RFLX TIT/PATN Unknown   Rpt  Ribonucleic Protein(ENA) Antibody, IgG Latest Ref Range: <1.0 NEG AI  <1.0 NEG    SSA (Ro) (ENA) Antibody, IgG Latest Ref Range: <1.0 NEG AI <1.0 NEG <1.0 NEG   SSB (La) (ENA) Antibody, IgG Latest Ref Range: <1.0 NEG AI <1.0 NEG <1.0 NEG   Scleroderma (Scl-70) (ENA) Antibody, IgG Latest Ref Range: <1.0 NEG AI  <1.0 NEG      OV 11/08/2020  Subjective:  Patient ID: [name], male , DOB: Oct 24, 1940 , age 53 y.o. , MRN: [id number] , ADDRESS: [address] [place] [address] PCP Sheliah Hatch, MD Patient Care Team: Sheliah Hatch, MD as PCP - General (Family Medicine) Jerilee Field, MD as Consulting Physician (Urology) Karie Soda, MD as Consulting Physician (General Surgery) Lewayne Bunting, MD as Consulting Physician (Cardiology) Flo Shanks, MD as  Consulting Physician (Otolaryngology) Duke Salvia, MD as Consulting Physician (Cardiology) Maris Berger, MD as Consulting Physician (Ophthalmology) Delton Coombes Les Pou, MD as Consulting Physician (Pulmonary Disease) Napoleon Form, MD as Consulting Physician (Gastroenterology) Dahlia Byes, [facility] as Pharmacist (Pharmacist)  This Provider for this visit: Treatment Team:  Attending Provider: Kalman Shan, MD    11/08/2020 -   Chief Complaint  Patient presents with   Follow-up    Pt states that he has been worried about taking the Esbriet and states that he has not started taking it yet. States his breathing is about the same.     HPI SAMEUL SCHRICK 82 y.o. - here with wife to discuss disease, Wife is 58 years old. Her name is Myrnal She was a clinical trials coordinator in the late 1990s at [employer] and was involved in oncology and sildenafil trials. Lot of questions on type of fibrosis (IPF), natural course (few to several  yearS), quality of life expectations, anti fibrotics . Wants symptom relief - referred to rehab. HE is interested in support group - gave him contact number. Risk factor - prior smoking and amio. He has not started esbriet yet. Will do on 11/14/20 . This is becase he and wife went to  reunion. Ate fried food and got diarrhea. Now resolved.   [facility] Comprehensive ILD Questionnaire        Past Medical History :  Also copd, cad, chf nos, osa - not using cPAP. Hx of pna > 10 years ago. Hx of renal sones + Has had 5 mRNA covid shots. Last Sept 2022. Never had covid Chronic systolic chf - ef 30-35% in June 2021 (20% in 2017) Patulous esophagus GERD Hx of amio 15--20 years. STppped 2019 Elevated R diapharagm  ROS:  Fatigue + Dry eyes + Gerd +  FAMILY HISTORY of LUNG DISEASE:  denes  PERSONAL EXPOSURE HISTORY:  Smoker [phone]. NOt smoked in 49  years  HOME  EXPOSURE and HOBBY DETAILS :  [place] yers[place] in [place]. 2 of his tile floors have mildew. Not used jacuzzi in 10 years. 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  Patient has underlying COPD and IPF.  CT chest in September 22, 2020 showed probable UIP.  And mildly progressive compared to previous study.  Patient was recommended to start on Esbriet.  Patient says he has started Esbriet and seems to be tolerating okay.  He has had a couple episodes where he has some loose stools and he took Imodium.  Unfortunately did have some constipation so he also took MiraLAX which caused him to have upset stomach.  We discussed dietary recommendations and the use of Imodium.  Patient says appetite is good.  No nausea vomiting.  Patient is on Mexiletine , his Esbriet dose will need to remain at 534 mg 3 times a day.  Patient is a been on a tapering dose and has reached his maximum maintenance dose. Patient has shortness of breath with activities.  Remains active.  Gets winded with heavy lifting or prolonged walking.  Has a daily minimally productive cough.   12/06/2020 Follow up ; IPF, COPD  Patient returns for a 2-week follow-up.  Patient has underlying COPD.  Is on Breztri inhaler.  Patient says overall breathing is doing okay.  Feels that it has slightly improved with decreased cough.  Patient also has underlying IPF.  Recently started on Esbriet.  He is on a lower dose due to drug interactions.  Currently on 534 mg 3 times a day.  Last visit patient was having some increased stomach issues.  We discussed some dietary changes.  Patient is now eating less fried foods and higher protein.  Patient says this is made a huge difference and stomach is much better. Hepatic function panel on November 16, 2020 showed normal LFTs.   TEST/EVENTS :  09/22/2020 High-resolution CT chest compatible with interstitial lung disease probable UIP, mildly progressive compared to previous stud      OV 12/22/2020  Subjective:  Patient ID: [name], male , DOB: [birth year] , age 4 y.o. , MRN: [id number] , ADDRESS: [place] [address] PCP [name], MD Patient Care Team: [name], MD as PCP - General (Family Medicine) Festus Aloe, MD as Consulting Physician (Urology) Michael Boston, MD as Consulting Physician (General Surgery) Stanford Breed, Denice Bors, MD as Consulting Physician (Cardiology) Jodi Marble, MD as Consulting Physician (Otolaryngology) Deboraha Sprang, MD as Consulting Physician (Cardiology) Luberta Mutter, MD as Consulting Physician (Ophthalmology) Lamonte Sakai Rose Fillers, MD as Consulting Physician (Pulmonary Disease) Mauri Pole, MD as Consulting Physician (Gastroenterology) [name], [facility] as Pharmacist (Pharmacist)  This Provider for this visit: Treatment Team:  Attending Provider: Brand Males, MD   IPF dx given Sept 2022 -> started esbriet  12/22/2020 -   Chief Complaint  Patient presents with   Follow-up     HPI KRATOS LOWNDES 82 y.o. presents with his wife.-since giving him the diagnosis of IPF and recommending pirfenidone over nintedanib [he has heart disease] he see nurse practitioner 2x1 for a face-to-face visit not a video visit.  He is on 2 pills 3 times a day of pirfenidone.  This is because he is on mexiletine [antiarrhythmic that can also cause abdominal pain, headache and nausea] because of this and CYPa12 mechanism that could inhibit pirfenidone metabolism resulting in elevated levels of pirfenidone he has been kept on 2 pills 3 times daily.  He is upset that this was not picked up by the pulmonary pharmacist but only by the cardiology pharmacist.  He reports because of this he is lost a little bit of confidence in [place].  Overall though he says that he has chronic alternating diarrhea with constipation.  He takes Imodium for the diarrhea and then this resolves and constipation.  Then he has to take MiraLAX.  This is at his baseline despite being on Esbriet it is not worse.  He says he is just frustrated by this alternating constipation and diarrhea.  Between diarrhea and constipation  he does not favor constipation.  The only side effect he is noticing from [place] is that he is more fatigued.  Otherwise he is overall stable as seen by the symptom score below other than fatigue.  He is also worried because he is on submaximal doses of pirfenidone that his disease could progress.  I did explain to him that even at 2 pills 3 times daily it is beneficial and with inhibitor effects of mexiletine levels are slightly higher and could potentially reflect similar to being on 3 pills 3 times daily.  Did explain to him that  he might not be able to tolerate 3 pills 3 times daily.  Given his cardiac issues nintedanib currently as second line.  We had a shared understanding and agreement on this approach.  No results found.       OV 01/19/2021  Subjective:  Patient ID: [name], male , DOB: 1940/10/15 , age 70 y.o. , MRN: [id number] , ADDRESS: [place] [address] PCP [name], MD Patient Care Team: [name], MD as PCP - General (Family Medicine) Festus Aloe, MD as Consulting Physician (Urology) Michael Boston, MD as Consulting Physician (General Surgery) Stanford Breed Denice Bors, MD as Consulting Physician (Cardiology) Jodi Marble, MD as Consulting Physician (Otolaryngology) Deboraha Sprang, MD as Consulting Physician (Cardiology) Luberta Mutter, MD as Consulting Physician (Ophthalmology) Lamonte Sakai Rose Fillers, MD as Consulting Physician (Pulmonary Disease) Mauri Pole, MD as Consulting Physician (Gastroenterology) [name], [facility] as Pharmacist (Pharmacist)  This Provider for this visit: Treatment Team:  Attending Provider: Brand Males, MD    01/19/2021 -   Chief Complaint  Patient presents with   Follow-up    PFT performed today.  Pt states that he noticed with last bloodwork that his liver enzymes were elevated and also states that he has noticed some yellowing of skin.  IPF dx given Sept 2022 -> started  esbriet   HPI NEYLAN LANDFAIR 82 y.o. -returns for follow-up.  This visit is to see his pulmonary function test and his uptake with pirfenidone.  His pulmonary function test shows stability/slight improvement from September 2022 and similar to his previous baseline.  In terms of pirfenidone intake he continues to have alternating diarrhea and constipation.  In talking to him in more detail he says before his Esbriet he used to take MiraLAX once every other day and have a bowel movement [hard stool] every day once.  After this starting as.  He is now taking his MiraLAX only 1/week.  1 day he has diarrhea in 4 days he is got constipation without any bowel movement.  Therefore he is not taking his MiraLAX for his previous schedule which we discussed at the last visit.  In addition he is lost 1 pound of weight.  He is also reporting intermittent gaseous symptoms bloating acid reflux sour taste.  He takes Tums for this.  He is not taking anything for acid reflux such as PPI or H2 blockade.  He is just taking Tums.  We did discuss the idea of taking ginger capsules but he drinks ginger ale 1 bottle every day.  Did indicate to him even in previous visit that sugary drinks can cause diarrhea and potentially a carbonated drink can contribute to acid reflux.  He is agreed to stop this.  I showed him an example of a ginger capsule that he could buy at [facility] or convenience store.  Of note his Esbriet intake is 2 pills 3 times daily due to drug interaction with mexiletine. For associated emphysema is on inhaler therapy.  Of note: His AST on 01/01/2021 was 44.  Other lab calls is abnormal even though it is below 45.  He was informed of this results.  The plan is to retest today.  He does tell me that he and his wife noticed that 1 day a few weeks ago that his left finger was yellow and after that it turned out to be normal after a few days.  Due to concerns of potential jaundice and worsening LFTs Arava and the  ongoing  GI side effects have asked him to stop his Esbriet till further notice or 1 week    OV 03/09/2021  Subjective:  Patient ID: [name], male , DOB: [birth year] , age 12 y.o. , MRN: [id number] , ADDRESS: [place] [address] PCP [name], MD Patient Care Team: [name], MD as PCP - General (Family Medicine) Festus Aloe, MD as Consulting Physician (Urology) Michael Boston, MD as Consulting Physician (General Surgery) Stanford Breed Denice Bors, MD as Consulting Physician (Cardiology) Jodi Marble, MD as Consulting Physician (Otolaryngology) Deboraha Sprang, MD as Consulting Physician (Cardiology) Luberta Mutter, MD as Consulting Physician (Ophthalmology) Lamonte Sakai Rose Fillers, MD as Consulting Physician (Pulmonary Disease) Mauri Pole, MD as Consulting Physician (Gastroenterology) [name], [facility] (Pharmacist)  This Provider for this visit: Treatment Team:  Attending Provider: Brand Males, MD    03/09/2021 -   Chief Complaint  Patient presents with   Follow-up    Pt states he is about the same since last visit. States he recently began pulmonary rehab.   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 -returns for his IPF follow-up.  He has further weight loss.  He is now down to 199 pounds although he is still overweight.  His BMI is over 28.  He is confident that his weight loss is because of medications.  It is otherwise drug-induced weight loss.  He believes the pirfenidone has reduced his appetite.  In addition Jardiance is also causing weight loss.  At this point in time he is happy with his weight loss because he is still overweight.  Overall he is tolerating his pirfenidone well.  He was having alternating diarrhea and constipation but after taking my  suggestion and trialing out different regimens he is now settled on taking omeprazole along with ginger and MiraLAX in a scheduled way.  This is now helped prevent the alternating diarrhea and constipation.  He is quite happy with this.  He takes his pirfenidone at 2 pills 3 times daily and 5-6 hours apart.  It does interfere with the social schedule.  Sometimes family wants to have have dinner or lunch at different times.  He is asking for some flexibility.  I encouraged him to try taking it slightly out of schedule and also with different types of snacks and experiment to see whether he has any side effects.  I did indicate to him ultimately the side effects of what will determine his tolerance.  He tells me that the omeprazole is doing quite well to control the GERD.  However the over-the-counter blister pack says it is only for 14 days.  He took clarification on this.  We discussed the long-term effects of omeprazole but overall taking balance versus risk I recommended that he continue his omeprazole for now.  In terms of his symptoms he stable.  He is attending pulmonary rehabilitation.  He says it is helping him.  His wife used to be a clinical trials coordinator.  He is interested in clinical trials.  However at this point in time he says his son is requested that he take a second opinion at [facility].  He is seeing Dr. Wynn Maudlin on 03/14/2021.  I did indicate to him that we are all part of the pulmonary fibrosis  foundation network.  I did indicate to him that Dr. Randol Kern is a well-known expert.  I have also indicated to him that I support the second opinion and I have personally emailed Dr. Randol Kern.  I have asked the patient to take CD-ROM of the CT scan to [facility].  Recent lab work 7 days ago shows normal creatinine and normal liver function test.  [facility] Dr Maxie Barb 03/14/21  Mr. Lafontaine is a very pleasant 81 y.o.-year-old male with no significant prior pulmonary problems as a child or  younger adult. He also has history of CAD, ICM/chronic systolic/diastolic HF s/p ICD, Afib on mexiletine, HTN, HLD. In 2021, he first noticed worsening shortness of breath on exertion but also attributed to COPD and Afib. Think it has been gradually worsening over the past 6 months but denies any recent illnesses. He was initially diagnosed based on imaging showing probable UIP during management of COPD. Followed by Dr. Romilda Garret. Never treated with steroids. Negative autoimmune panel in 2017 and 09/2020. He was previously on amiodarone for 15-20 years and stopped in 2019 due to concerns for ILD. Started on Esbriet in Sept 2022. Initially had a lot of diarrhea and constipation, and now more tolerable with less GI issues since starting omeprazole and ginger root and miralax 2-3x /week. Wears sunscreen and UV protective clothing.   He reports breathing is ok. Biggest problem is dry mouth and cough productive light yellow/green sputum chronic for the past 4 years attributed to COPD and chronic sinus drainage. Takes Breztri, albuterol, ipratropium nasal sprays which helps. Able to clear sputum. Started pulmonary rehab about 3 weeks ago in mid Feb 2023 which has helped a lot. Doing it twice a week, first 15 minutes walking, 2nd 15 minutes with stationary elliptical, then stretching but has to rest 2-3 hours at home. No fever/chills/night sweats. Lost weight 211 to 193lb since starting Esbriet.  His current exercise capacity includes walking 15 minute every morning [place]   Mr. Goodlow is a very pleasant 82 y.o.-year-old male with no significant prior pulmonary problems as a child or younger adult. He also has history of CAD, ICM/chronic systolic/diastolic HF s/p ICD, Afib on mexiletine, HTN, HLD. In 2021, he first noticed worsening shortness of breath on exertion but also attributed to COPD and Afib. Think it has been gradually worsening over the past 6 months but denies any  recent illnesses. He was initially diagnosed based on imaging showing probable UIP during management of COPD. Followed by Dr. Romilda Garret. Never treated with steroids. Negative autoimmune panel in 2017 and 09/2020. He was previously on amiodarone for 15-20 years and stopped in 2019 due to concerns for ILD. Started on Esbriet in Sept 2022. Initially had a lot of diarrhea and constipation, and now more tolerable with less GI issues since starting omeprazole and ginger root and miralax 2-3x /week. Wears sunscreen and UV protective clothing.   He reports breathing is ok. Biggest problem is dry mouth and cough productive light yellow/green sputum chronic for the past 4 years attributed to COPD and chronic sinus drainage. Takes Breztri, albuterol, ipratropium nasal sprays which helps. Able to clear sputum. Started pulmonary rehab about 3 weeks ago in mid Feb 2023 which has helped a lot. Doing it twice a week, first 15 minutes walking, 2nd 15 minutes with stationary elliptical, then stretching but has to rest 2-3 hours at home. No fever/chills/night sweats. Lost weight 211 to 193lb since starting Esbriet.  His current  exercise capacity includes walking 15 minute every morning [place]    Mr. Gladin is a 82 y.o. 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 He has overall been stable to very slightly progressed on imaging and PFTs for more than 6-8 years which is atypical for IPF. [place] panel. Negative autoimmune labs. He has been appropriately managed with Esbriet, dose reduced due to interactions with mexiletine, as well as pulmonary rehab. He is also being closely followed by cardiology for systolic HF with last echo in 2021 showing no RV dysfunction. Recommend to continue current dose of Esbriet and encouraged  to stay active while at home.  He also has a large esophagus on imaging and he reports severe acid reflux prior to starting Esbriet that is now better controlled on Esbriet. Aspiration and reflux unlikely to be primary cause of fibrosis but may be exacerbating his disease in predominantly basilar distribution so educated patient on aspiration precautions including raising HOB at least 30 degrees and not eating meals at least 2-3 hours before bedtime. He will try to nap in his recliner with head raised instead of lying flat in bed.   OV 05/07/2021  Subjective:  Patient ID: [name], male , DOB: 08-02-1940 , age 19 y.o. , MRN: [id number] , ADDRESS: [place] [address] PCP [name], MD Patient Care Team: [name], MD as PCP - General (Family Medicine) Festus Aloe, MD as Consulting Physician (Urology) Michael Boston, MD as Consulting Physician (General Surgery) Stanford Breed Denice Bors, MD as Consulting Physician (Cardiology) Jodi Marble, MD as Consulting Physician (Otolaryngology) Deboraha Sprang, MD as Consulting Physician (Cardiology) Luberta Mutter, MD as Consulting Physician (Ophthalmology) Lamonte Sakai Rose Fillers, MD as Consulting Physician (Pulmonary Disease) Mauri Pole, MD as Consulting Physician (Gastroenterology) [name], [facility] (Pharmacist) Dimitri Ped, RN as Case Manager  This Provider for this visit: Treatment Team:  Attending Provider: Brand Males, MD    05/07/2021 -   Chief Complaint  Patient presents with   Follow-up    Pt states he has been doing well since last visit and denies any complaints.    HPI JHAMAL POLMANTEER 82 y.o. -returns for follow-up.  After last visit he did see Dr. Wynn Maudlin at [facility].  I reviewed the notes.  Really appreciate Dr. Randol Kern adding an extra perspective.  His notes are captured above.  Patient continues on pirfenidone at 2 pills 3 times daily in the  setting of mexiletine.  He has completed rehabilitation.  Has had 5 COVID boosters.  He asked me about taking more booster.  I recommended to hold off but again emphasized his risk benefit ratio.  Did express that if he got COVID he could take antiviral.  We should wait till a new vaccine for the new strains comes up.  At this point in time the vaccine is for the old strains.  He feels overall stable after taking pirfenidone and also pulmonary rehabilitation.  He says this is the best he is felt.  He continues with his alternating diarrhea and constipation but it is actually improved.  Because of this he does not want to do any clinical trial that might have GI side effects.  He is interested in clinical trials but at this point  in time he wants to hold off.  He got a letter from Dr. Silverio Decamp about undergoing colonoscopy.  I did approve this.  He continues to lose weight he says this is unintentional and account of pirfenidone and his diabetic drug.  Currently the level of weight loss is healthy.  Of note he has chronic systolic heart failure.  Last echocardiogram was in 2021.  He is going to see Dr. Stanford Breed in June 2023.  I did recommend getting an echocardiogram before he saw Dr. Stanford Breed and I will order this I had of Dr. Jacalyn Lefevre visit.  He is okay with this plan.  He wants to have this done . HAve this done in [place] area    CT Chest data  No results found.    PFT  OV 08/03/2021  Subjective:  Patient ID: [name], male , DOB: 1940-05-22 , age 28 y.o. , MRN: [id number] , ADDRESS: [place] [address] PCP [name], MD Patient Care Team: [name], MD as PCP - General (Family Medicine) Festus Aloe, MD as Consulting Physician (Urology) Michael Boston, MD as Consulting Physician (General Surgery) Stanford Breed Denice Bors, MD as Consulting Physician (Cardiology) Jodi Marble, MD as Consulting Physician (Otolaryngology) Deboraha Sprang, MD  as Consulting Physician (Cardiology) Luberta Mutter, MD as Consulting Physician (Ophthalmology) Lamonte Sakai Rose Fillers, MD as Consulting Physician (Pulmonary Disease) Mauri Pole, MD as Consulting Physician (Gastroenterology) [name], [facility] (Pharmacist)  This Provider for this visit: Treatment Team:  Attending Provider: Brand Males, MD    08/03/2021 -   Chief Complaint  Patient presents with   Follow-up    Follow-up IPF cough, SOB, Wheezing     HPI [name] 82 y.o. -+ returns for follow-up.  Since his last visit he continues to be stable from a dyspnea standpoint.  Is undergoing pulmonary rehabilitation.  On the pulmonary function test is a little bit waxing and waning but I would say the overall trend in the last 1 year appears stable although definitely progressive in the last 3 years.  It appears that since starting pirfenidone he is likely stable.  He is on the lower dose pirfenidone.  The main issue is that he is struggling with the pirfenidone.  His fatigue is a little more than usual.  He is also having alternating constipation and diarrhea.  However he is able to be stable on this.  Also he says that since attending rehabilitation his got some right infrascapular infra axillary muscle pull symptoms.  He is wondering if it is because of rehabilitation.  His CT scan late last year did not show any abnormalities consistent with a mass.  He has had weight loss because of pirfenidone and also Jardiance.  Although he feels this is stabilized.  He feels it is in a happy point.  He had an echocardiogram and his ejection fraction is 35%.   PFT   OV 11/08/2021  Subjective:  Patient ID: [name], male , DOB: 1940/06/26 , age 7 y.o. , MRN: [id number] , ADDRESS: [place] [address] PCP [name], MD Patient Care Team: [name], MD as PCP - General (Family Medicine) Festus Aloe, MD as Consulting Physician  (Urology) Michael Boston, MD as Consulting Physician (General Surgery) Stanford Breed, Denice Bors, MD as Consulting Physician (Cardiology) Jodi Marble, MD as Consulting Physician (Otolaryngology) Deboraha Sprang, MD as Consulting Physician (Cardiology) Luberta Mutter, MD as Consulting Physician (Ophthalmology) Lamonte Sakai,  Rose Fillers, MD as Consulting Physician (Pulmonary Disease) Mauri Pole, MD as Consulting Physician (Gastroenterology) [name], [facility] (Pharmacist)  This Provider for this visit: Treatment Team:  Attending Provider: Brand Males, MD   11/08/2021 -   Chief Complaint  Patient presents with   Follow-up    Follow-up for CT PT states no changes with breathing     HPI [name] 82 y.o. -returns for follow-up.  Presents with his wife.  Wife is returning after a long time.  She is to work in clinical trials.  He is feeling stable although he is continue to lose weight.  He feels the low-dose of Esbriet is being well-tolerated except for significant fatigue which she attributes to pirfenidone.  He is continue to lose weight which is a combination of Jardiance for his heart failure and also his pirfenidone.  He still technically overweight with a current weight with a BMI of 26 but he is getting close to ideal body weight.  He is aware that if he starts going below ideal body weight then we will have to make decisions about stopping pirfenidone or Jardiance.  Last visit there was some confusion but this pulmonary function test was worsening so we got a high-resolution CT chest and this shows stability for the last 1 year suggesting beneficial effects of pirfenidone/Esbriet.  However he does have progressive disease over the course of few years.  His GI symptoms are more stable at this point.  Even though he has a lot of fatigue he is willing to continue with pirfenidone  He is interested in clinical trials as a care option.  His wife worked in clinical trials he  understands this experimental nature of clinical trials with drug development he understands the placebo concept.  We discussed the fact we have 5 trials going on.  I do not recommend 2 which had significant GI side effects.  He does not qualify for cough study because he is on carvedilol and this drug interactions.  He is aware that studies of strict inclusion exclusion criteria and his medical problems from his heart or EKG or drug interaction issues could preclude him.  With that in mind we discussed the potential of the following studies  Inhaled treprostinil: Preliminary study shows beneficial effects and IPF with lung function improvement.  This medicine is available in the market for many years for pulmonary hypertension.  He will have to take it as a nebulizer 4 times daily and each time 9-12 times.  The study visits are not too cumbersome.  However he finds this inconvenient from a medication administration standpoint  Subcutaneous injection monoclonal antibody Vixarelimab: Every 2 weeks.  Evidence is only on prurigo appears 1 yesterday with open label extension.  Injections every 2 weeks the significant number of visits.  We discussed side effect profile on this based on established literature.  He is open to this idea but wants a second choice.  50% chance of placebo   Oral tablet Bexotegrast - Pliant study.  33% chance of placebo.  Well-tolerated in the early phase to 78-monthstudy.  Minimal GI side effects.  Initial improvement over 4 weeks and then some tailing off on preliminary data but still significant improvement compared to placebo.  He is interested in this.  The study is not activated at our location.  I gave him publicly available information of the above studies to him and his wife.    OV 02/21/2022  Subjective:  Patient ID:  [name], male , DOB: 06-Oct-1940 , age 43 y.o. , MRN: [id number] , ADDRESS: [address] [place] [address] PCP Sheliah Hatch,  MD Patient Care Team: Sheliah Hatch, MD as PCP - General (Family Medicine) Jerilee Field, MD as Consulting Physician (Urology) Karie Soda, MD as Consulting Physician (General Surgery) Jens Som Madolyn Frieze, MD as Consulting Physician (Cardiology) Flo Shanks, MD as Consulting Physician (Otolaryngology) Duke Salvia, MD as Consulting Physician (Cardiology) Maris Berger, MD as Consulting Physician (Ophthalmology) Delton Coombes Les Pou, MD as Consulting Physician (Pulmonary Disease) Napoleon Form, MD as Consulting Physician (Gastroenterology) Erroll Luna, [facility] (Pharmacist)  This Provider for this visit: Treatment Team:  Attending Provider: Kalman Shan, MD    02/21/2022 -   Chief Complaint  Patient presents with   Follow-up    Review PFT today.  Cough persistent.  Only able to do minimal exercises due to fatigue and SOB.  Patient would like to have a spacer device to use with Breztri   %  HPI JAQUILLE STICKLE 82 y.o. -reports for followup.  Overall feels stable but reporting fatigue and ongoing weight loss. We discussed the possibilities that this is esbriet which Is very likely. However, today walking Pacer HR stuck at 60/min. He is also on Jardiance which contributes to weight loss. Weight currently at Ideal body weight. We discussed that any further weight loss a-> then we would have to stop esbriet or jardiance ((likely esbriet) for a 1-2 months and then see if weight goes up.He does not like the idea of stopping esbriet.   So, we diuscussed calorie count and improving nutrition  He has also decided against participating in clniical trial - His QTcF baseline is prolonged anyways. He is also asking for a spacer for Breztri      OV 07/16/2022  Subjective:  Patient ID: [name], male , DOB: Nov 04, 1940 , age 29 y.o. , MRN: [id number] , ADDRESS: [address] [place] [address] PCP Sheliah Hatch, MD Patient Care Team: Sheliah Hatch, MD as PCP - General (Family Medicine) Jerilee Field, MD as Consulting Physician (Urology) Karie Soda, MD as Consulting Physician (General Surgery) Lewayne Bunting, MD as Consulting Physician (Cardiology) Flo Shanks, MD as Consulting Physician (Otolaryngology) Duke Salvia, MD as Consulting Physician (Cardiology) Maris Berger, MD as Consulting Physician (Ophthalmology) Delton Coombes Les Pou, MD as Consulting Physician (Pulmonary Disease) Napoleon Form, MD as Consulting Physician (Gastroenterology) Erroll Luna, 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  Having excellent high-fiber fish based diet.  GErd on ppi  Chronic systolic heart failure S/p pacer - permananet A Fib Hx V Tach  -2017 ejection fraction 25%  -May 2023 ejection fraction 30-35   HPI KHALI WESTFAHL 82 y.o. -returns for follow-up.  This is a routine follow-up.  But he does have an interim acute issue.  He says last week 3 times his AICD did not go off but he  felt tachycardic.  He then got a call alert saying he had V. tach.  It appears the pacemaker prevented the V. tach.  He is really worried about this.  He has upcoming appointment Dr. Hurman Horn.  I did review the external records yesterday from cardiology and they have increased his carvedilol.  He is wondering if his pulmonary issues or any of his medications could be putting him at risk for V. tach.  In addition he also states that despite this, he  has been in the yard last week with his golf cart and stayed out a long time.  He did sweat he did get a little dehydrated.  I did indicate to him this might be the reason may be some electrolyte imbalance.  In addition he is on Breztri for his mild COPD.  He does have long-acting beta agonist although the safety record of this does not show any V. tach I did indicate to him that we could just push to Spiriva.  He is fine with this.    Other issues - From a respiratory standpoint he feels stable but symptom scores below show worsening. But sit stand hypoxemia test he did not desaturate.  But his pulmonary function test does show decline.    - From a pirfenidone tolerance standpoint he is tolerating it well.  He is improved his diet by taking MiraLAX 3-4 times a day.  He is also eating fish 3 times a week and a lot of fruits and vegetables.  With this his bowel regimen is improved.  He is no longer having diarrhea.  He has some intermittent constipation but he feels overall he is in a good spot.  This because he used to suffer from significant alternating.  - re Esbriet/Pirfenidone requires intensive drug monitoring due to high concerns for Adverse effects of , including  Drug Induced Liver Injury, significant GI side effects that include but not limited to Diarrhea, Nausea, Vomiting,  and other system side effects that include Fatigue, headaches, weight loss and other side effects such as skin rash. These will be monitored with  blood work such as LFT initially once a month for 6 months and then quarterly   -Acid reflux: His not taking his omeprazole because of fears of kidney side effects  -Weight loss: He says he is eating a lot of ice cream and he stopped losing weight.  He has now gained some weight.   Symptoms:  SYMPTOM S CALE - ILD 11/08/2020 12/22/2020 Esbriet since sept/pct 2022 01/19/2021 Esbreit 2 pills tid 03/09/2021  05/07/2021  08/03/2021  11/08/2021  02/21/2022  07/16/2022   Current  weight 209# 207# 206# 199# 194# 190# - esbriet 185# - esbrieet, jardiance 183# 191#  O2 use ra ra         Shortness of Breath 0 -> 5 scale with 5 being worst (score 6 If unable to do)          At rest 0 0 0 0 0 0 0 0 0  0Simple tasks - showers, clothes change, eating, shaving 0 0 0 0 0 0 1 0 0  Household (dishes, doing bed, laundry) 0 0 0 0 0 0 1 0 1  Shopping 3 2 0 4 4 3 5 4 5   Walking level at own pace 4 2 2 1 1 1 2 3 4   Walking up Stairs 5 5 3 4 4 4 4 5 4   Total (30-36) Dyspnea Score 12 x 2 years  same Since onset 9 5 9 9 8 13 12 15   How bad is your cough? moderate x  2years. Getting worse. Has light yellow/green sputum. Clears throat 1 4 4 3 4 4 5 5   How bad is your fatigue light 5 4 4 3 5 4 3 4   How bad is nausea 0 1 2 1  0 3 2 0 0  How bad is vomiting?  0 0 0 0 0 0 0 0 00  How bad is diarrhea? 0 4 5 1 2 4 4 4  0  How bad is anxiety? 0 4 3 1  0 2 0 0 0  How bad is depression 0 1 2 0 0 0 0 0 0  Any chronic pain - if so where and how bad 0     x x  0     Simple office walk 185 feet x  3 laps goal with forehead probe 10/05/2020  03/09/2021  08/03/2021  02/21/2022  07/16/2022   O2 used ra ra ra ra ra  Number laps completed 3 3 3 2  Sit stand  Comments about pace avg avg nl    Resting Pulse Ox/HR 100% and 70/min 99% and 70 97% and HR 60 99% 96% and HR 71  Final Pulse Ox/HR 97% and 82/min 97% and 82 98% and HR 72 97% 95% and HR 60  Desaturated </= 88% no no np    Desaturated <= 3% points yes no no    Got Tachycardic >/= 90/min no no no    Symptoms at end of test Mild dyspnea no no  no  Miscellaneous comments x x       PFT     Latest Ref Rng & Units 07/16/2022    2:24 PM 02/21/2022    2:53 PM 08/03/2021    2:41 PM 01/19/2021   12:54 PM 09/27/2020    9:51 AM 02/09/2018    1:53 PM 03/12/2017    1:55 PM  PFT Results  FVC-Pre L 2.28  P 2.46  2.43  2.67  2.34  3.02  2.82   FVC-Predicted Pre % 56  P 58  57  63  55  69  64   FVC-Post L     2.44  2.97  2.78   FVC-Predicted Post %     57  67   63   Pre FEV1/FVC % % 84  P 86  87  82  84  82  82   Post FEV1/FCV % %     82  82  83   FEV1-Pre L 1.92  P 2.12  2.11  2.19  1.98  2.48  2.30   FEV1-Predicted Pre % 66  P 70  69  72  65  78  72   FEV1-Post L     2.00  2.42  2.30   DLCO uncorrected ml/min/mmHg 14.04  P 15.74  14.82  14.09  14.62  18.10  18.46   DLCO UNC% % 57  P 62  58  55  57  53  54   DLCO corrected ml/min/mmHg 13.50  P 15.74  14.82  14.09  14.62  17.90    DLCO COR %Predicted % 55  P 62  58  55  57  53    DLVA Predicted % 99  P 107  103  93  101  90  95   TLC L     4.17  4.65  4.94   TLC %  Predicted %     57  64  68   RV % Predicted %     46  56  68     P Preliminary result       has a past medical history of Adenomatous colon polyp (2009; 06/2012), AICD (automatic cardioverter/defibrillator) present, Atrial fibrillation (HCC), Bilateral sensorineural hearing loss, BPH (benign prostatic hypertrophy), CAD (coronary artery disease), CHF (congestive heart failure) (HCC), Chronic constipation, Chronic rhinitis, COPD (chronic obstructive pulmonary disease) (HCC), Diverticulosis of colon (2005), Gallstones (06/2014), GERD (gastroesophageal reflux disease), Hepatic steatosis (2010; 2016), Hyperlipidemia, Hypertension, Ischemic cardiomyopathy, Microscopic hematuria, Nephrolithiasis, Obstructive sleep apnea, Presence of permanent cardiac pacemaker, Squamous cell carcinoma of back, and Ventricular tachycardia (HCC).   reports that he quit smoking about 42 years ago. His smoking use included cigarettes. He has a 30.00 pack-year smoking history. He has never used smokeless tobacco.  Past Surgical History:  Procedure Laterality Date   ATRIAL ABLATION SURGERY     flutter ablation 2002   BREAST SURGERY Left 1990   "Tissue removed from breast"   CARDIAC CATHETERIZATION     CARDIOVERSION N/A 05/10/2020   Procedure: CARDIOVERSION;  Surgeon: Thurmon Fair, MD;  Location: [facility] ENDOSCOPY;  Service: Cardiovascular;  Laterality: N/A;    carotid doppler  09/25/12   No signif stenosis   COLONOSCOPY N/A 07/13/2012   Diverticula ascending colon.  Polyp x 1-recall 5 yrs (Dr. Ewing Schlein) Procedure: COLONOSCOPY;  Surgeon: Petra Kuba, MD;  Location: [facility] ENDOSCOPY;  Service: Endoscopy;  Laterality: N/A; polypectomy x 1    COLONOSCOPY WITH PROPOFOL N/A 08/18/2015   Procedure: COLONOSCOPY WITH PROPOFOL;  Surgeon: Napoleon Form, MD;  Location: [facility] ENDOSCOPY;  Service: Endoscopy;  Laterality: N/A;   ESOPHAGOGASTRODUODENOSCOPY N/A 07/13/2012   Procedure: ESOPHAGOGASTRODUODENOSCOPY (EGD);  Surgeon: Petra Kuba, MD;  Location: [facility];  Service: Endoscopy;  Laterality: N/A;  NORMAL   EXTRACORPOREAL SHOCK WAVE LITHOTRIPSY     HEMORRHOID BANDING     ICD     a) Guidant Contak H170- Sherryl Manges, MD 07/25/06 (second device) b) Medtronic CRT-D   ICD GENERATOR CHANGEOUT N/A 02/14/2020   Procedure: ICD GENERATOR CHANGEOUT;  Surgeon: Duke Salvia, MD;  Location: [facility] INVASIVE CV LAB;  Service: Cardiovascular;  Laterality: N/A;   INGUINAL HERNIA REPAIR Bilateral 1975   LEAD REVISION Bilateral 12/26/2011   Procedure: LEAD REVISION;  Surgeon: Marinus Maw, MD;  Location: [facility] CATH LAB;  Service: Cardiovascular;  Laterality: Bilateral;   PFTs  02/2015   Mod restriction (interstitial), mod diffusion defect (Dr. Maple Hudson)   precancer area keratsis removed  05/2015    leftf orehead and left arm and right arm and back    Allergies  Allergen Reactions   Sulfa Antibiotics Rash    Immunization History  Administered Date(s) Administered   Fluad Quad(high Dose 65+) 09/23/2018, 10/05/2020, 11/08/2021   Influenza Split 11/11/2010, 11/02/2012, 10/14/2013   Influenza Whole 09/27/2011, 11/13/2011   Influenza, High Dose Seasonal PF 09/26/2015, 10/07/2016   Influenza, Seasonal, Injecte, Preservative Fre 09/23/2018   Influenza,inj,Quad PF,6+ Mos 11/03/2016, 09/20/2017   Influenza-Unspecified 09/26/2014, 10/15/2019, 11/11/2021   PFIZER Comirnaty(Gray  Top)Covid-19 Tri-Sucrose Vaccine 04/24/2020   PFIZER(Purple Top)SARS-COV-2 Vaccination 02/02/2019, 02/22/2019, 10/26/2019, 09/29/2020, 11/14/2021   Pneumococcal Conjugate-13 06/15/2014   Pneumococcal Polysaccharide-23 03/15/2002, 07/10/2015   Respiratory Syncytial Virus Vaccine,Recomb Aduvanted(Arexvy) 11/14/2021   Td 08/27/2017   Tdap 04/04/2010   Zoster Recombinant(Shingrix) 03/29/2016   Zoster, Live 05/15/2015    Family History  Problem Relation Age of Onset  Heart disease Mother    Other Mother        colon surgery for fistula   Gallstones Mother    Breast cancer Maternal Grandmother    Coronary artery disease Father        family hx of   Heart attack Father        4 stents/ pacemaker   Nephrolithiasis Father    Hypertension Sister    Goiter Paternal Grandmother    Nephrolithiasis Son    Colon cancer Neg Hx      Current Outpatient Medications:    antiseptic oral rinse (BIOTENE) LIQD, 15 mLs by Mouth Rinse route in the morning and at bedtime., Disp: , Rfl:    BREZTRI AEROSPHERE 160-9-4.8 MCG/ACT AERO, INHALE 2 PUFFS INTO THE LUNGS IN THE MORNING AND AT BEDTIME, Disp: 10.7 g, Rfl: 6   carvedilol (COREG) 3.125 MG tablet, TAKE 1 TABLET(3.125 MG) BY MOUTH IN THE AM AND TAKE 2 TABLETS (3.125 MG) TOTAL OF 6.25 MG BY MOUTH IN THE PM WITH A MEAL., Disp: 180 tablet, Rfl: 3   cholecalciferol (VITAMIN D) 1000 units tablet, Take 1,000 Units by mouth every evening., Disp: , Rfl:    ciclopirox (PENLAC) 8 % solution, APPLY OVER NAIL AND SURROUNDING SKIN EVERY NIGHT AT BEDTIME. APPLY OVER PREVIOUSE COATS FOR 7 DAYS THEN REMOVE WITH ALCOHOL AND CONTINUE, Disp: 6.6 mL, Rfl: 0   clotrimazole (MYCELEX) 10 MG troche, Dissolve, swish and swallow one tablet by mouth 5 times daily for 14 days., Disp: 70 tablet, Rfl: 1   ENTRESTO 24-26 MG, TAKE 1 TABLET BY MOUTH TWICE DAILY, Disp: 180 tablet, Rfl: 3   eplerenone (INSPRA) 25 MG tablet, TAKE 1 TABLET(25 MG) BY MOUTH DAILY, Disp: 90 tablet, Rfl: 3    furosemide (LASIX) 40 MG tablet, Take 1 tablet (40 mg total) by mouth daily., Disp: 90 tablet, Rfl: 3   Ginger, Zingiber officinalis, (GINGER ROOT) 550 MG CAPS, Take 1 capsule by mouth daily., Disp: , Rfl:    ipratropium (ATROVENT) 0.06 % nasal spray, Place 2 sprays into both nostrils 3 (three) times daily., Disp: 15 mL, Rfl: 1   JARDIANCE 10 MG TABS tablet, TAKE 1 TABLET(10 MG) BY MOUTH DAILY BEFORE BREAKFAST, Disp: 90 tablet, Rfl: 3   levalbuterol (XOPENEX HFA) 45 MCG/ACT inhaler, INHALE 2 PUFFS INTO THE LUNGS EVERY 4 HOURS AS NEEDED FOR WHEEZING, Disp: 15 g, Rfl: 6   loratadine (CLARITIN) 10 MG tablet, Take 10 mg by mouth in the morning., Disp: , Rfl:    Magnesium 250 MG TABS, Take 250 mg by mouth every evening., Disp: , Rfl:    mexiletine (MEXITIL) 150 MG capsule, Take 2 capsules (300 mg total) by mouth 2 (two) times daily., Disp: 360 capsule, Rfl: 2   Multiple Vitamin (MULTIVITAMIN WITH MINERALS) TABS, Take 1 tablet by mouth every evening., Disp: , Rfl:    nitroGLYCERIN (NITROSTAT) 0.4 MG SL tablet, DISSOLVE 1 TABLET UNDER THE TONGUE EVERY 5 MINUTES AS NEEDED FOR CHEST PAIN, Disp: 25 tablet, Rfl: 5   Olopatadine HCl (PATADAY OP), Apply 1 drop to eye daily., Disp: , Rfl:    Pirfenidone (ESBRIET) 267 MG TABS, Take 2 tablets (534 mg total) by mouth with breakfast, with lunch, and with evening meal., Disp: 540 tablet, Rfl: 0   polyvinyl alcohol-povidone (HYPOTEARS) 1.4-0.6 % ophthalmic solution, Place 1-2 drops into both eyes in the morning and at bedtime., Disp: , Rfl:    potassium chloride SA (KLOR-CON M) 20 MEQ tablet, Take 1 tablet (  20 mEq total) by mouth daily with lunch., Disp: 90 tablet, Rfl: 3   rivaroxaban (XARELTO) 20 MG TABS tablet, TAKE 1 TABLET(20 MG) BY MOUTH DAILY WITH SUPPER, Disp: 90 tablet, Rfl: 3   rosuvastatin (CRESTOR) 40 MG tablet, TAKE 1 TABLET(40 MG) BY MOUTH EVERY EVENING, Disp: 90 tablet, Rfl: 3   Spacer/Aero-Holding Chambers (AEROCHAMBER MV) inhaler, Use as instructed,  Disp: 1 each, Rfl: 0      Objective:   Vitals:   07/16/22 1527  BP: 96/60  Pulse: 60  SpO2: 95%  Weight: 191 lb 6.4 oz (86.8 kg)  Height: 5\' 10"  (1.778 m)    Estimated body mass index is 27.46 kg/m as calculated from the following:   Height as of this encounter: 5\' 10"  (1.778 m).   Weight as of this encounter: 191 lb 6.4 oz (86.8 kg).  @WEIGHTCHANGE @  [employer]   07/16/22 1527  Weight: 191 lb 6.4 oz (86.8 kg)     Physical Exam   General: No distress. Looks well O2 at rest: no Cane present: no Sitting in wheel chair: no Frail: no Obese: no Neuro: Alert and Oriented x 3. GCS 15. Speech normal Psych: Pleasant Resp:  Barrel Chest - no.  Wheeze - no, Crackles - YES base, No overt respiratory distress CVS: Normal heart sounds. Murmurs - no Ext: Stigmata of Connective Tissue Disease - no HEENT: Normal upper airway. PEERL +. No post nasal drip        Assessment:       ICD-10-CM   1. ILD (interstitial lung disease) (HCC)  J84.9          Plan:     Patient Instructions  History V . Tach on remote monitoring - late June 2024  Plan - check cbc, bmet, mag, phos, LFT, BNP and troponin 07/16/2022 - changing breztril to spiriva (to reduce any risk from bronchodilator) =- check ONO pulse ox on room air - for night hypoxemia  - primary management per Dr Patrecia Pace Fibrosis with emphysema ILD (interstitial lung disease) (HCC) IPF (idiopathic pulmonary fibrosis) (HCC)  - IPF might be progressing  PLAN - -Currently on Esbriet 2 pills 3 times daily   -  being capped because baseline mexilitine can increase esbriet levels  - esbiret contributing to fatigue - check LFT 07/16/2022 ( see above) - check ONO on room air (see above) - spiro/dlco in 2.5 - 3 months - HRCT supine in 2.5 - 3 months ( 1 year CT; last Sept 2023) - if definite disease progression, consider clinical trials balancing heart issues    Chronic obstructive pulmonary disease,  unspecified COPD type (HCC)  - not in flare up - emphysema component is super mild and breztri might be too much of medicine   Plan  - STOP breztri daily - START SPIRIVA respimat low dose , 2 puff once daily - albuterol as needed  Fatigue  - fatigue is due to esbriet but also due to heart issues esp pacer might not be responding to exertion  Plan  - monitor  GERD - appears omeprazole helping you bu t you are worried about kidney side effect and in feb 2024 stopped omeprazole  Plan  - monitor off  omeprazole  Weight loss- drug induced Low appetite   - weight loss  due to esbriet and jardiance  but seems stabilized - currently 191# on 07/16/2022 and improved 8# since feb 2024   Plan  -continue healthy diet - might have to  hold esbriet if weight loss continues < 170#   Followup - Return in about 12 weeks (around 10/08/2022) for 30 min visit, after Cleda Daub and DLCO, ILD, with Dr Marchelle Gearing, Face to Face Visit.     SIGNATURE    Dr. Kalman Shan, M.D., F.C.C.P,  Pulmonary and Critical Care Medicine Staff Physician, [facility] Director - Interstitial Lung Disease  Program  Pulmonary Fibrosis [facility] at [facility] [place], [place], [address]  Pager: [phone], If no answer or between  15:00h - 7:00h: call [phone] Telephone: [phone]  4:08 PM 07/16/2022

## 2022-07-16 NOTE — Patient Instructions (Addendum)
History V . Tach on remote monitoring - late June 2024  Plan - check cbc, bmet, mag, phos, LFT, BNP and troponin 07/16/2022 - changing breztril to spiriva (to reduce any risk from bronchodilator) =- check ONO pulse ox on room air - for night hypoxemia  - primary management per Dr Patrecia Pace Fibrosis with emphysema ILD (interstitial lung disease) (HCC) IPF (idiopathic pulmonary fibrosis) (HCC)  - IPF might be progressing  PLAN - -Currently on Esbriet 2 pills 3 times daily   -  being capped because baseline mexilitine can increase esbriet levels  - esbiret contributing to fatigue - check LFT 07/16/2022 ( see above) - check ONO on room air (see above) - spiro/dlco in 2.5 - 3 months - HRCT supine in 2.5 - 3 months ( 1 year CT; last Sept 2023) - if definite disease progression, consider clinical trials balancing heart issues    Chronic obstructive pulmonary disease, unspecified COPD type (HCC)  - not in flare up - emphysema component is super mild and breztri might be too much of medicine   Plan  - STOP breztri daily - START SPIRIVA respimat low dose , 2 puff once daily - albuterol as needed  Fatigue  - fatigue is due to esbriet but also due to heart issues esp pacer might not be responding to exertion  Plan  - monitor  GERD - appears omeprazole helping you bu t you are worried about kidney side effect and in feb 2024 stopped omeprazole  Plan  - monitor off  omeprazole  Weight loss- drug induced Low appetite   - weight loss  due to esbriet and jardiance  but seems stabilized - currently 191# on 07/16/2022 and improved 8# since feb 2024   Plan  -continue healthy diet - might have to hold esbriet if weight loss continues < 170#   Followup - Return in about 12 weeks (around 10/08/2022) for 30 min visit, after Cleda Daub and DLCO, ILD, with Dr Marchelle Gearing, Face to Face Visit.

## 2022-07-17 ENCOUNTER — Telehealth: Payer: Self-pay

## 2022-07-17 ENCOUNTER — Other Ambulatory Visit: Payer: Self-pay | Admitting: Internal Medicine

## 2022-07-17 LAB — HEPATIC FUNCTION PANEL
ALT: 24 U/L (ref 0–53)
AST: 26 U/L (ref 0–37)
Albumin: 3.8 g/dL (ref 3.5–5.2)
Alkaline Phosphatase: 57 U/L (ref 39–117)
Bilirubin, Direct: 0.1 mg/dL (ref 0.0–0.3)
Total Bilirubin: 0.6 mg/dL (ref 0.2–1.2)
Total Protein: 6.7 g/dL (ref 6.0–8.3)

## 2022-07-17 MED ORDER — RANOLAZINE ER 500 MG PO TB12: 500.0000 mg | ORAL_TABLET | Freq: Two times a day (BID) | ORAL | 0 refills | Status: DC

## 2022-07-17 NOTE — Telephone Encounter (Signed)
Alert received from [employer]:  Device alert Antitachycardia pacing (ATP) therapy delivered to convert arrhythmia. Event occurred 7/2 @ 09:34, EGM shows abrupt onset of sustained VT, rate 156, ATP delivered x1 converting to regular BiV pace.  There are 3 new NSVT since 6/29 transmission.

## 2022-07-17 NOTE — Telephone Encounter (Signed)
Duke Salvia, MD at 07/17/2022  9:53 AM  Status: Signed  Spoke with patient No symptoms Will try ranolazine 500 bid as outpt and if unsuccessful will resume amiodarone with misgivings inlight of his ILD       Outreach made to Pt.  He states he was stung by yellow jackets last night.  Wonders if this could have caused his episodes.  Advised he was stung in the evening, episodes were in the AM.  Pt agreeable to starting Ranexa 500 mg BID PO.  Sent to pharmacy in 30 day supply.  Pt has follow up scheduled.  Will continue to monitor.

## 2022-07-17 NOTE — Telephone Encounter (Signed)
Spoke with patient No symptoms Will try ranolazine 500 bid as outpt and if unsuccessful will resume amiodarone with misgivings inlight of his ILD

## 2022-07-31 ENCOUNTER — Other Ambulatory Visit: Payer: Self-pay | Admitting: Pharmacist

## 2022-07-31 DIAGNOSIS — J84112 Idiopathic pulmonary fibrosis: Secondary | ICD-10-CM

## 2022-07-31 MED ORDER — PIRFENIDONE 267 MG PO TABS
534.0000 mg | ORAL_TABLET | Freq: Three times a day (TID) | ORAL | 1 refills | Status: AC
Start: 2022-07-31 — End: ?

## 2022-07-31 NOTE — Telephone Encounter (Signed)
Refill sent for ESBRIET to [facility] ([facility]) for Esbriet: [phone]  Dose: 534 mg three times daily  Last OV: 07/16/22 Provider: Dr. Marchelle Gearing  Next OV: due at the end of Sept 2024  LFTs 07/16/22  Chesley Mires, PharmD, MPH, BCPS Clinical Pharmacist (Rheumatology and Pulmonology)

## 2022-08-08 ENCOUNTER — Other Ambulatory Visit: Payer: Self-pay | Admitting: Internal Medicine

## 2022-08-09 NOTE — Progress Notes (Signed)
HPI: FU history of coronary artery disease, mixed ischemic and nonischemic cardiomyopathy, history of ICD, paroxysmal atrial fibrillation, and ventricular tachycardia. Previous abdominal ultrasound in April 2006 showed no aneurysm. Last cardiac catheterization in October 2008 showed an occluded right coronary artery with left-to-right collateralization. Ejection fraction is 20%. There was no other coronary disease noted. ABIs in December of 2011 were normal. Previous carotid Dopplers in Sept 2014 showed 0-39% stenosis bilaterally. The patient has had upgrade of his device to CRT-D.  Amiodarone discontinued March 2019 because of potential pulmonary toxicity. Nuclear study April 2022 showed ejection fraction 16%, large scar with no significant ischemia. Pt underwent DCCV 05/10/20.  At follow-up office visit patient was in recurrent atrial fibrillation and now felt to be permanent. Echocardiogram May 2023 showed ejection fraction 30 to 35%, grade 1 diastolic dysfunction, severe left atrial enlargement, moderate to severe mitral regurgitation, moderate tricuspid regurgitation.  Chest CT September 2023 showed fibrosis in a pattern suggestive of UIP.  Since last seen, patient has some dyspnea on exertion.  Over the past 7 to 10 days he has had increased cough with blood-streaked sputum.  He contacted Dr. Jane Canary office and was placed on Keflex and has had some improvement.  He denies fevers, chills or chest pain.  Recently placed on ranolazine for recurrent ventricular tachycardia by Dr. Graciela Husbands.  Current Outpatient Medications  Medication Sig Dispense Refill   antiseptic oral rinse (BIOTENE) LIQD 15 mLs by Mouth Rinse route in the morning and at bedtime.     BREZTRI AEROSPHERE 160-9-4.8 MCG/ACT AERO INHALE 2 PUFFS INTO THE LUNGS IN THE MORNING AND AT BEDTIME 10.7 g 6   carvedilol (COREG) 3.125 MG tablet TAKE 1 TABLET(3.125 MG) BY MOUTH IN THE AM AND TAKE 2 TABLETS (3.125 MG) TOTAL OF 6.25 MG BY MOUTH IN  THE PM WITH A MEAL. 180 tablet 3   cephALEXin (KEFLEX) 500 MG capsule Take 1 capsule (500 mg total) by mouth 3 (three) times daily for 5 days. 15 capsule 0   cholecalciferol (VITAMIN D) 1000 units tablet Take 1,000 Units by mouth every evening.     ciclopirox (PENLAC) 8 % solution APPLY OVER NAIL AND SURROUNDING SKIN EVERY NIGHT AT BEDTIME. APPLY OVER PREVIOUSE COATS FOR 7 DAYS THEN REMOVE WITH ALCOHOL AND CONTINUE 6.6 mL 0   clotrimazole (MYCELEX) 10 MG troche Dissolve, swish and swallow one tablet by mouth 5 times daily for 14 days. 70 tablet 1   ENTRESTO 24-26 MG TAKE 1 TABLET BY MOUTH TWICE DAILY 180 tablet 3   eplerenone (INSPRA) 25 MG tablet TAKE 1 TABLET(25 MG) BY MOUTH DAILY 90 tablet 3   furosemide (LASIX) 40 MG tablet Take 1 tablet (40 mg total) by mouth daily. 90 tablet 3   Ginger, Zingiber officinalis, (GINGER ROOT) 550 MG CAPS Take 1 capsule by mouth daily.     ipratropium (ATROVENT) 0.06 % nasal spray Place 2 sprays into both nostrils 3 (three) times daily. 15 mL 1   JARDIANCE 10 MG TABS tablet TAKE 1 TABLET(10 MG) BY MOUTH DAILY BEFORE BREAKFAST 90 tablet 3   levalbuterol (XOPENEX HFA) 45 MCG/ACT inhaler INHALE 2 PUFFS INTO THE LUNGS EVERY 4 HOURS AS NEEDED FOR WHEEZING 15 g 6   loratadine (CLARITIN) 10 MG tablet Take 10 mg by mouth in the morning.     Magnesium 250 MG TABS Take 250 mg by mouth every evening.     mexiletine (MEXITIL) 150 MG capsule Take 2 capsules (300 mg total) by mouth  2 (two) times daily. 360 capsule 2   Multiple Vitamin (MULTIVITAMIN WITH MINERALS) TABS Take 1 tablet by mouth every evening.     nitroGLYCERIN (NITROSTAT) 0.4 MG SL tablet DISSOLVE 1 TABLET UNDER THE TONGUE EVERY 5 MINUTES AS NEEDED FOR CHEST PAIN 25 tablet 5   Olopatadine HCl (PATADAY OP) Apply 1 drop to eye daily.     Pirfenidone (ESBRIET) 267 MG TABS Take 2 tablets (534 mg total) by mouth with breakfast, with lunch, and with evening meal. 540 tablet 1   polyvinyl alcohol-povidone (HYPOTEARS)  1.4-0.6 % ophthalmic solution Place 1-2 drops into both eyes in the morning and at bedtime.     potassium chloride SA (KLOR-CON M) 20 MEQ tablet Take 1 tablet (20 mEq total) by mouth daily with lunch. 90 tablet 3   ranolazine (RANEXA) 500 MG 12 hr tablet Take 1 tablet (500 mg total) by mouth 2 (two) times daily. 180 tablet 3   rivaroxaban (XARELTO) 20 MG TABS tablet TAKE 1 TABLET(20 MG) BY MOUTH DAILY WITH SUPPER 90 tablet 3   rosuvastatin (CRESTOR) 40 MG tablet TAKE 1 TABLET(40 MG) BY MOUTH EVERY EVENING 90 tablet 3   Spacer/Aero-Holding Chambers (AEROCHAMBER MV) inhaler Use as instructed 1 each 0   Tiotropium Bromide Monohydrate (SPIRIVA RESPIMAT) 1.25 MCG/ACT AERS Inhale 2 puffs into the lungs daily. 4 g 5   No current facility-administered medications for this visit.     Past Medical History:  Diagnosis Date   Adenomatous colon polyp 2009; 06/2012   Colonoscopy 07/2007---Dr. Maryjane Hurter [facility]--Repeat 06/2012 showed one tubular adenoma w/out high grade dysplasia.   AICD (automatic cardioverter/defibrillator) present    Environmental manager   Atrial fibrillation (HCC)    Bilateral sensorineural hearing loss    BPH (benign prostatic hypertrophy)    PSAs ok (followed by urologist): no LUTS   CAD (coronary artery disease)    PTCA RCA 1997 (Inf/post MI);   CHF (congestive heart failure) (HCC)    Chronic constipation    Chronic rhinitis    ?vasomotor (Dr. Lazarus Salines)   COPD (chronic obstructive pulmonary disease) (HCC)    Diverticulosis of colon 2005   Noted on Colonoscopies in 2005 and 2009 and on CT 2010   Gallstones 06/2014   No evidence of cholecystitis on abd u/s 06/2014   GERD (gastroesophageal reflux disease)    Hepatic steatosis 2010; 2016   Noted on CT abd 2010 and on u/s abd 04/2009 (transaminasemia) and u/s 06/2014.   Hyperlipidemia    Hypertension    Ischemic cardiomyopathy    Microscopic hematuria    Cysto neg 02/2009 except for BPH-likely has microhem from his renal stone dz    Nephrolithiasis    No distal stones on CT 2010; stable 8 mm RLP stone, 3mm LMP stone 10/2013 plain film.  Abd u/s 06/2014: 9 mm nonobstructing lower pole right renal stone.  03/2015 urol: stable nonobstructing renal calc by CT 12/2014 and KUB 03/2015.   Obstructive sleep apnea    no cpap uses since weight loss of 50 pounds   Presence of permanent cardiac pacemaker    [employer]   Squamous cell carcinoma of back    Ventricular tachycardia [facility])     Past Surgical History:  Procedure Laterality Date   ATRIAL ABLATION SURGERY     flutter ablation 2002   BREAST SURGERY Left 1990   "Tissue removed from breast"   CARDIAC CATHETERIZATION     CARDIOVERSION N/A 05/10/2020   Procedure: CARDIOVERSION;  Surgeon: Thurmon Fair,  MD;  Location: [facility] ENDOSCOPY;  Service: Cardiovascular;  Laterality: N/A;   carotid doppler  09/25/12   No signif stenosis   COLONOSCOPY N/A 07/13/2012   Diverticula ascending colon.  Polyp x 1-recall 5 yrs (Dr. Ewing Schlein) Procedure: COLONOSCOPY;  Surgeon: Petra Kuba, MD;  Location: [facility] ENDOSCOPY;  Service: Endoscopy;  Laterality: N/A; polypectomy x 1    COLONOSCOPY WITH PROPOFOL N/A 08/18/2015   Procedure: COLONOSCOPY WITH PROPOFOL;  Surgeon: Napoleon Form, MD;  Location: [facility] ENDOSCOPY;  Service: Endoscopy;  Laterality: N/A;   ESOPHAGOGASTRODUODENOSCOPY N/A 07/13/2012   Procedure: ESOPHAGOGASTRODUODENOSCOPY (EGD);  Surgeon: Petra Kuba, MD;  Location: [facility];  Service: Endoscopy;  Laterality: N/A;  NORMAL   EXTRACORPOREAL SHOCK WAVE LITHOTRIPSY     HEMORRHOID BANDING     ICD     a) Guidant Contak H170- Sherryl Manges, MD 07/25/06 (second device) b) Medtronic CRT-D   ICD GENERATOR CHANGEOUT N/A 02/14/2020   Procedure: ICD GENERATOR CHANGEOUT;  Surgeon: Duke Salvia, MD;  Location: [facility] INVASIVE CV LAB;  Service: Cardiovascular;  Laterality: N/A;   INGUINAL HERNIA REPAIR Bilateral 1975   LEAD REVISION Bilateral 12/26/2011   Procedure: LEAD REVISION;  Surgeon: Marinus Maw, MD;  Location: [facility] CATH LAB;  Service: Cardiovascular;  Laterality: Bilateral;   PFTs  02/2015   Mod restriction (interstitial), mod diffusion defect (Dr. Maple Hudson)   precancer area keratsis removed  05/2015    leftf [place] and left arm and right arm and back    Social History   Socioeconomic History   Marital status: Married    Spouse name: Not on file   Number of children: 3   Years of education: 16   Highest education level: Bachelor's degree (e.g., BA, AB, BS)  Occupational History   Occupation: Retired    Associate Professor: RETIRED    Comment: Personnel service  Tobacco Use   Smoking status: Former    Current packs/day: 0.00    Average packs/day: 2.0 packs/day for 15.0 years (30.0 ttl pk-yrs)    Types: Cigarettes    Start date: 01/14/1965    Quit date: 01/15/1980    Years since quitting: 42.6   Smokeless tobacco: Never  Vaping Use   Vaping status: Never Used  Substance and Sexual Activity   Alcohol use: No    Alcohol/week: 0.0 standard drinks of alcohol   Drug use: No   Sexual activity: Yes  Other Topics Concern   Not on file  Social History Narrative   Married.   Hx of cigarettes: quit in the 1980s.  No alc/drugs.   Social Determinants of Health   Financial Resource Strain: Low Risk  (11/13/2021)   Overall Financial Resource Strain (CARDIA)    Difficulty of Paying Living Expenses: Not hard at all  Food Insecurity: No Food Insecurity (11/13/2021)   Hunger Vital Sign    Worried About Running Out of Food in the Last Year: Never true    Ran Out of Food in the Last Year: Never true  Transportation Needs: No Transportation Needs (11/13/2021)   PRAPARE - Administrator, Civil Service (Medical): No    Lack of Transportation (Non-Medical): No  Physical Activity: Insufficiently Active (11/13/2021)   Exercise Vital Sign    Days of Exercise per Week: 5 days    Minutes of Exercise per Session: 10 min  Stress: No Stress Concern Present (11/13/2021)   [employer] of Occupational Health - Occupational Stress Questionnaire    Feeling of Stress :  Not at all  Social Connections: Moderately Isolated (11/13/2021)   Social Connection and Isolation Panel [NHANES]    Frequency of Communication with Friends and Family: More than three times a week    Frequency of Social Gatherings with Friends and Family: More than three times a week    Attends Religious Services: Never    Database administrator or Organizations: No    Attends Banker Meetings: Never    Marital Status: Married  Catering manager Violence: Not At Risk (11/13/2021)   Humiliation, Afraid, Rape, and Kick questionnaire    Fear of Current or Ex-Partner: No    Emotionally Abused: No    Physically Abused: No    Sexually Abused: No    Family History  Problem Relation Age of Onset   Heart disease Mother    Other Mother        colon surgery for fistula   Gallstones Mother    Breast cancer Maternal Grandmother    Coronary artery disease Father        family hx of   Heart attack Father        4 stents/ pacemaker   Nephrolithiasis Father    Hypertension Sister    Goiter Paternal Grandmother    Nephrolithiasis Son    Colon cancer Neg Hx     ROS: no fevers or chills, productive cough, hemoptysis, dysphasia, odynophagia, melena, hematochezia, dysuria, hematuria, rash, seizure activity, orthopnea, PND, pedal edema, claudication. Remaining systems are negative.  Physical Exam: Well-developed well-nourished in no acute distress.  Skin is warm and dry.  HEENT is normal.  Neck is supple.  Chest is clear to auscultation with normal expansion.  Cardiovascular exam is regular rate and rhythm.  Abdominal exam nontender or distended. No masses palpated. Extremities show no edema. neuro grossly intact   A/P  1 permanent atrial fibrillation-continue carvedilol at present dose for rate control.  Continue Xarelto.  2 mixed ischemic/nonischemic cardiomyopathy-continue  Entresto and carvedilol at present dose.  3 history of mitral regurgitation-plan follow-up echocardiogram.  4 chronic systolic congestive heart failure-patient remains euvolemic.  Continue Jardiance, spironolactone and Lasix at present dose.  5 coronary artery disease-continue statin.  No aspirin given need for Xarelto.  6 history of hypertension-patient's blood pressure is controlled.  Continue present medications.  7 hyperlipidemia-continue statin.  8 ventricular tachycardia-continue beta-blocker and mexiletine.  Amiodarone discontinued previously secondary to lung toxicity.  9 biventricular ICD-Per EP.  10 hemoptysis-patient sounds to have an upper respiratory infection.  He questioned COVID.  I have asked him to check himself at home and if positive he can contact primary care for further management.  He will continue on Keflex and if his hemoptysis persist he will follow-up with pulmonary as well.  Olga Millers, MD

## 2022-08-16 ENCOUNTER — Ambulatory Visit: Payer: Medicare Other | Attending: Internal Medicine | Admitting: Internal Medicine

## 2022-08-16 ENCOUNTER — Ambulatory Visit (INDEPENDENT_AMBULATORY_CARE_PROVIDER_SITE_OTHER): Payer: Medicare Other

## 2022-08-16 ENCOUNTER — Encounter: Payer: Self-pay | Admitting: Internal Medicine

## 2022-08-16 VITALS — BP 98/62 | HR 66 | Ht 70.0 in | Wt 190.0 lb

## 2022-08-16 DIAGNOSIS — I4729 Other ventricular tachycardia: Secondary | ICD-10-CM | POA: Diagnosis not present

## 2022-08-16 DIAGNOSIS — I428 Other cardiomyopathies: Secondary | ICD-10-CM

## 2022-08-16 DIAGNOSIS — I5022 Chronic systolic (congestive) heart failure: Secondary | ICD-10-CM | POA: Insufficient documentation

## 2022-08-16 DIAGNOSIS — Z95810 Presence of automatic (implantable) cardiac defibrillator: Secondary | ICD-10-CM

## 2022-08-16 DIAGNOSIS — I4821 Permanent atrial fibrillation: Secondary | ICD-10-CM | POA: Insufficient documentation

## 2022-08-16 DIAGNOSIS — I255 Ischemic cardiomyopathy: Secondary | ICD-10-CM | POA: Insufficient documentation

## 2022-08-16 LAB — CUP PACEART REMOTE DEVICE CHECK
Battery Remaining Longevity: 102 mo
Battery Remaining Percentage: 100 %
Brady Statistic RA Percent Paced: 0 %
Brady Statistic RV Percent Paced: 95 %
Date Time Interrogation Session: 20240802013400
HighPow Impedance: 43 Ohm
Implantable Lead Connection Status: 753985
Implantable Lead Connection Status: 753985
Implantable Lead Connection Status: 753985
Implantable Lead Implant Date: 20020527
Implantable Lead Implant Date: 20020527
Implantable Lead Implant Date: 20131212
Implantable Lead Location: 753858
Implantable Lead Location: 753859
Implantable Lead Location: 753860
Implantable Lead Model: 148
Implantable Lead Model: 4194
Implantable Lead Model: 6940
Implantable Lead Serial Number: 120244
Implantable Pulse Generator Implant Date: 20220131
Lead Channel Impedance Value: 654 Ohm
Lead Channel Impedance Value: 680 Ohm
Lead Channel Setting Pacing Amplitude: 2.5 V
Lead Channel Setting Pacing Amplitude: 2.5 V
Lead Channel Setting Pacing Pulse Width: 0.4 ms
Lead Channel Setting Pacing Pulse Width: 1 ms
Lead Channel Setting Sensing Sensitivity: 0.6 mV
Lead Channel Setting Sensing Sensitivity: 1 mV
Pulse Gen Serial Number: 149562

## 2022-08-16 LAB — CUP PACEART INCLINIC DEVICE CHECK
Date Time Interrogation Session: 20240802122602
Implantable Lead Connection Status: 753985
Implantable Lead Connection Status: 753985
Implantable Lead Connection Status: 753985
Implantable Lead Implant Date: 20020527
Implantable Lead Implant Date: 20020527
Implantable Lead Implant Date: 20131212
Implantable Lead Location: 753858
Implantable Lead Location: 753859
Implantable Lead Location: 753860
Implantable Lead Model: 148
Implantable Lead Model: 4194
Implantable Lead Model: 6940
Implantable Lead Serial Number: 120244
Implantable Pulse Generator Implant Date: 20220131
Pulse Gen Serial Number: 149562

## 2022-08-16 MED ORDER — RANOLAZINE ER 500 MG PO TB12: 500.0000 mg | ORAL_TABLET | Freq: Two times a day (BID) | ORAL | 3 refills | Status: DC

## 2022-08-16 NOTE — Progress Notes (Unsigned)
Patient Care Team: Sheliah Hatch, MD as PCP - General (Family Medicine) Jerilee Field, MD as Consulting Physician (Urology) Karie Soda, MD as Consulting Physician (General Surgery) Jens Som Madolyn Frieze, MD as Consulting Physician (Cardiology) Flo Shanks, MD as Consulting Physician (Otolaryngology) Duke Salvia, MD as Consulting Physician (Cardiology) Maris Berger, MD as Consulting Physician (Ophthalmology) Delton Coombes Les Pou, MD as Consulting Physician (Pulmonary Disease) Napoleon Form, MD as Consulting Physician (Gastroenterology) Erroll Luna, [facility] (Inactive) (Pharmacist)   HPI  CC atrial fibrillation  [name] is a 82 y.o. male   in the setting of ischemic heart disease and previously implanted CRT-D with subsequent left ventricular lead macro dislodgment. His symptoms however been quite stable. When he saw Dr. Ladona Ridgel concerning lead extraction and lead implantation he was doing as well as he had for some time and it was decided not to pursue it then. Ultimately this was done  2013  He also has ventricular tachycardia for which he took amiodarone.   required adjunctive mexiletine. LFTs were abnormal and his Crestor was stopped.  Subsequently normalized even on amiodarone.  Amiodarone stopped spring 2019 because of issues of lung toxicity   He also has atrial fibrillation and is taking Xarelto   recurrent atrial fibrillation prompted cardioversion 4/22   Reverted to Atrial fib at this visit.  Dual diagnosis of interstitial pulmonary fibrosis with a prognosis that he understands to be in the 3 year range.  The medications which he is taking makes him feel terrible.  Because of worsening shortness of breath he was referred for an echocardiogram which demonstrated worsening MR and LA enlargement although LV sizes were somewhat smaller (see below  He continues to feel terrible with lack of energy.  But his son points out that he is able to go dove  hunting albeit while sitting down for 4 to 5 hours at a time.  His time with his children and grandchildren is rejuvenating  Interval ventricular tachycardia.  Improved with the addition of ranolazine to mexiletine       DATE TEST EF   10/08 Cath    20 % T RCA  12/13 Cath  <20% T RCA   1/17 Echo    20-25 %   6/21 Echo  30-35% MR mild LAE 41 mm/m2 LV  ID 6.4/7.6  4/22 Myoview  16% Large scar; no ischemia  /23 Echo  30-35% MR mod-severe LAE severe  73 mm/m2 LV ID 5.6/6.5 cm       Date Cr K Hgb PLCO LDL  2008    80%   11/17     56%(3/17)    6/18 0.95 4.0 15.2  162  5/19 0.95 4.0      10/20 0.87 4.1 14.2    1/22 1.0 4.1 15.5    5/223 1.01 3.9 15.7    5/24 0.86 4.9 16.1       Thromboembolic risk factors ( age -70, HTN-1, Vasc disease -1, CHF-1) for a CHADSVASc Score of  >= 5    Past Medical History:  Diagnosis Date   Adenomatous colon polyp 2009; 06/2012   Colonoscopy 07/2007---Dr. Maryjane Hurter [facility]--Repeat 06/2012 showed one tubular adenoma w/out high grade dysplasia.   AICD (automatic cardioverter/defibrillator) present    [employer]   Atrial fibrillation (HCC)    Bilateral sensorineural hearing loss    BPH (benign prostatic hypertrophy)    PSAs ok (followed by urologist): no LUTS   CAD (coronary artery disease)  PTCA RCA 1997 (Inf/post MI);   CHF (congestive heart failure) (HCC)    Chronic constipation    Chronic rhinitis    ?vasomotor (Dr. Lazarus Salines)   COPD (chronic obstructive pulmonary disease) (HCC)    Diverticulosis of colon 2005   Noted on Colonoscopies in 2005 and 2009 and on CT 2010   Gallstones 06/2014   No evidence of cholecystitis on abd u/s 06/2014   GERD (gastroesophageal reflux disease)    Hepatic steatosis 2010; 2016   Noted on CT abd 2010 and on u/s abd 04/2009 (transaminasemia) and u/s 06/2014.   Hyperlipidemia    Hypertension    Ischemic cardiomyopathy    Microscopic hematuria    Cysto neg 02/2009 except for BPH-likely has microhem from his  renal stone dz   Nephrolithiasis    No distal stones on CT 2010; stable 8 mm RLP stone, 3mm LMP stone 10/2013 plain film.  Abd u/s 06/2014: 9 mm nonobstructing lower pole right renal stone.  03/2015 urol: stable nonobstructing renal calc by CT 12/2014 and KUB 03/2015.   Obstructive sleep apnea    no cpap uses since weight loss of 50 pounds   Presence of permanent cardiac pacemaker    [employer]   Squamous cell carcinoma of back    Ventricular tachycardia [facility])     Past Surgical History:  Procedure Laterality Date   ATRIAL ABLATION SURGERY     flutter ablation 2002   BREAST SURGERY Left 1990   "Tissue removed from breast"   CARDIAC CATHETERIZATION     CARDIOVERSION N/A 05/10/2020   Procedure: CARDIOVERSION;  Surgeon: Thurmon Fair, MD;  Location: [facility] ENDOSCOPY;  Service: Cardiovascular;  Laterality: N/A;   carotid doppler  09/25/12   No signif stenosis   COLONOSCOPY N/A 07/13/2012   Diverticula ascending colon.  Polyp x 1-recall 5 yrs (Dr. Ewing Schlein) Procedure: COLONOSCOPY;  Surgeon: Petra Kuba, MD;  Location: [facility] ENDOSCOPY;  Service: Endoscopy;  Laterality: N/A; polypectomy x 1    COLONOSCOPY WITH PROPOFOL N/A 08/18/2015   Procedure: COLONOSCOPY WITH PROPOFOL;  Surgeon: Napoleon Form, MD;  Location: [facility] ENDOSCOPY;  Service: Endoscopy;  Laterality: N/A;   ESOPHAGOGASTRODUODENOSCOPY N/A 07/13/2012   Procedure: ESOPHAGOGASTRODUODENOSCOPY (EGD);  Surgeon: Petra Kuba, MD;  Location: [facility];  Service: Endoscopy;  Laterality: N/A;  NORMAL   EXTRACORPOREAL SHOCK WAVE LITHOTRIPSY     HEMORRHOID BANDING     ICD     a) Guidant Contak H170- Sherryl Manges, MD 07/25/06 (second device) b) Medtronic CRT-D   ICD GENERATOR CHANGEOUT N/A 02/14/2020   Procedure: ICD GENERATOR CHANGEOUT;  Surgeon: Duke Salvia, MD;  Location: [facility] INVASIVE CV LAB;  Service: Cardiovascular;  Laterality: N/A;   INGUINAL HERNIA REPAIR Bilateral 1975   LEAD REVISION Bilateral 12/26/2011   Procedure: LEAD REVISION;   Surgeon: Marinus Maw, MD;  Location: [facility] CATH LAB;  Service: Cardiovascular;  Laterality: Bilateral;   PFTs  02/2015   Mod restriction (interstitial), mod diffusion defect (Dr. Maple Hudson)   precancer area keratsis removed  05/2015    leftf orehead and left arm and right arm and back    Current Outpatient Medications  Medication Sig Dispense Refill   antiseptic oral rinse (BIOTENE) LIQD 15 mLs by Mouth Rinse route in the morning and at bedtime.     BREZTRI AEROSPHERE 160-9-4.8 MCG/ACT AERO INHALE 2 PUFFS INTO THE LUNGS IN THE MORNING AND AT BEDTIME 10.7 g 6   carvedilol (COREG) 3.125 MG tablet TAKE 1 TABLET(3.125 MG) BY MOUTH  IN THE AM AND TAKE 2 TABLETS (3.125 MG) TOTAL OF 6.25 MG BY MOUTH IN THE PM WITH A MEAL. 180 tablet 3   cholecalciferol (VITAMIN D) 1000 units tablet Take 1,000 Units by mouth every evening.     ciclopirox (PENLAC) 8 % solution APPLY OVER NAIL AND SURROUNDING SKIN EVERY NIGHT AT BEDTIME. APPLY OVER PREVIOUSE COATS FOR 7 DAYS THEN REMOVE WITH ALCOHOL AND CONTINUE 6.6 mL 0   clotrimazole (MYCELEX) 10 MG troche Dissolve, swish and swallow one tablet by mouth 5 times daily for 14 days. 70 tablet 1   ENTRESTO 24-26 MG TAKE 1 TABLET BY MOUTH TWICE DAILY 180 tablet 3   eplerenone (INSPRA) 25 MG tablet TAKE 1 TABLET(25 MG) BY MOUTH DAILY 90 tablet 3   furosemide (LASIX) 40 MG tablet Take 1 tablet (40 mg total) by mouth daily. 90 tablet 3   Ginger, Zingiber officinalis, (GINGER ROOT) 550 MG CAPS Take 1 capsule by mouth daily.     ipratropium (ATROVENT) 0.06 % nasal spray Place 2 sprays into both nostrils 3 (three) times daily. 15 mL 1   JARDIANCE 10 MG TABS tablet TAKE 1 TABLET(10 MG) BY MOUTH DAILY BEFORE BREAKFAST 90 tablet 3   levalbuterol (XOPENEX HFA) 45 MCG/ACT inhaler INHALE 2 PUFFS INTO THE LUNGS EVERY 4 HOURS AS NEEDED FOR WHEEZING 15 g 6   loratadine (CLARITIN) 10 MG tablet Take 10 mg by mouth in the morning.     Magnesium 250 MG TABS Take 250 mg by mouth every evening.      mexiletine (MEXITIL) 150 MG capsule Take 2 capsules (300 mg total) by mouth 2 (two) times daily. 360 capsule 2   Multiple Vitamin (MULTIVITAMIN WITH MINERALS) TABS Take 1 tablet by mouth every evening.     nitroGLYCERIN (NITROSTAT) 0.4 MG SL tablet DISSOLVE 1 TABLET UNDER THE TONGUE EVERY 5 MINUTES AS NEEDED FOR CHEST PAIN 25 tablet 5   Olopatadine HCl (PATADAY OP) Apply 1 drop to eye daily.     Pirfenidone (ESBRIET) 267 MG TABS Take 2 tablets (534 mg total) by mouth with breakfast, with lunch, and with evening meal. 540 tablet 1   polyvinyl alcohol-povidone (HYPOTEARS) 1.4-0.6 % ophthalmic solution Place 1-2 drops into both eyes in the morning and at bedtime.     potassium chloride SA (KLOR-CON M) 20 MEQ tablet Take 1 tablet (20 mEq total) by mouth daily with lunch. 90 tablet 3   ranolazine (RANEXA) 500 MG 12 hr tablet Take 1 tablet (500 mg total) by mouth 2 (two) times daily. 60 tablet 0   rivaroxaban (XARELTO) 20 MG TABS tablet TAKE 1 TABLET(20 MG) BY MOUTH DAILY WITH SUPPER 90 tablet 3   rosuvastatin (CRESTOR) 40 MG tablet TAKE 1 TABLET(40 MG) BY MOUTH EVERY EVENING 90 tablet 3   Spacer/Aero-Holding Chambers (AEROCHAMBER MV) inhaler Use as instructed 1 each 0   Tiotropium Bromide Monohydrate (SPIRIVA RESPIMAT) 1.25 MCG/ACT AERS Inhale 2 puffs into the lungs daily. 4 g 5   No current facility-administered medications for this visit.    Allergies  Allergen Reactions   Sulfa Antibiotics Rash    Review of Systems negative except from HPI and PMH  Physical Exam BP 98/62   Pulse 66   Ht 5\' 10"  (1.778 m)   Wt 190 lb (86.2 kg)   SpO2 97%   BMI 27.26 kg/m  Well developed and well nourished in no acute distress HENT normal Neck supple with JVP-flat Clear Device pocket well healed; without hematoma or erythema.  There is no tethering  Regular rate and rhythm, no  gallop No  murmur Abd-soft with active BS No Clubbing cyanosis  edema Skin-warm and dry A & Oriented  Grossly normal  sensory and motor function  ECG atrial fibrillation with ventricular pacing with an upright QRS lead V1 and negative QRS lead I  Device function is normal. Programming changes creased the lower rate from 60-65 activated accelerometer and decreased his threshold from medium--medium low with 3 short walking test in the office See Paceart for details     Assessment and  Plan   Ventricular tachycardia  Ischemic and nonischemic cardiomyopathy  Congestive heart failure-chronic-systolic  CRT-D-[employer]         COPD pulm fibrosis  Atrial fibrillation-permanent  Recurrent ventricular tachycardia.  None since early July with the addition of ranolazine.  Will continue this plus mexiletine.  Atrial fibrillation is permanent.  GFR is over 60.  Continue Xarelto 20  Chronotropic incompetence.  Have reprogrammed his device to improve heart rate response and that this will translate into improved symptoms.   Noischemica continue statin and Apixaban

## 2022-08-16 NOTE — Patient Instructions (Signed)
Medication Instructions:  Your physician recommends that you continue on your current medications as directed. Please refer to the Current Medication list given to you today.  *If you need a refill on your cardiac medications before your next appointment, please call your pharmacy*   Lab Work: None ordered.  If you have labs (blood work) drawn today and your tests are completely normal, you will receive your results only by: MyChart Message (if you have MyChart) OR A paper copy in the mail If you have any lab test that is abnormal or we need to change your treatment, we will call you to review the results.   Testing/Procedures: None ordered.    Follow-Up: At [facility], you and your health needs are our priority.  As part of our continuing mission to provide you with exceptional heart care, we have created designated Provider Care Teams.  These Care Teams include your primary Cardiologist (physician) and Advanced Practice Providers (APPs -  Physician Assistants and Nurse Practitioners) who all work together to provide you with the care you need, when you need it.  We recommend signing up for the patient portal called "MyChart".  Sign up information is provided on this After Visit Summary.  MyChart is used to connect with patients for Virtual Visits (Telemedicine).  Patients are able to view lab/test results, encounter notes, upcoming appointments, etc.  Non-urgent messages can be sent to your provider as well.   To learn more about what you can do with MyChart, go to [email or web address].    Your next appointment:   6 months with Dr Klein 

## 2022-08-17 ENCOUNTER — Telehealth: Payer: Self-pay | Admitting: Internal Medicine

## 2022-08-17 NOTE — Telephone Encounter (Signed)
[facility]  Cardiology sendt a message over weekend he is having discolored sputum +/0- mild hemoptysis . If still ongoing neds antibitic, Can do cephalexin 500mg  tid x 5 days   Allergies  Allergen Reactions   Sulfa Antibiotics Rash

## 2022-08-19 MED ORDER — CEPHALEXIN 500 MG PO CAPS: 500.0000 mg | ORAL_CAPSULE | Freq: Three times a day (TID) | ORAL | 0 refills | Status: AC

## 2022-08-19 NOTE — Telephone Encounter (Signed)
I called and spoke with the pt and notified of below recommendations from Dr. Marchelle Gearing. Rx sent to preferred pharm. Nothing further needed. I advised that he call if not improving. Nothing further needed.

## 2022-08-21 ENCOUNTER — Encounter: Payer: Self-pay | Admitting: Cardiology

## 2022-08-21 ENCOUNTER — Ambulatory Visit: Payer: Medicare Other | Attending: Cardiology | Admitting: Cardiology

## 2022-08-21 VITALS — BP 102/62 | HR 67 | Ht 70.0 in | Wt 187.0 lb

## 2022-08-21 DIAGNOSIS — I4729 Other ventricular tachycardia: Secondary | ICD-10-CM

## 2022-08-21 DIAGNOSIS — I5022 Chronic systolic (congestive) heart failure: Secondary | ICD-10-CM | POA: Diagnosis not present

## 2022-08-21 DIAGNOSIS — I255 Ischemic cardiomyopathy: Secondary | ICD-10-CM | POA: Diagnosis not present

## 2022-08-21 DIAGNOSIS — I2510 Atherosclerotic heart disease of native coronary artery without angina pectoris: Secondary | ICD-10-CM | POA: Insufficient documentation

## 2022-08-21 DIAGNOSIS — I4821 Permanent atrial fibrillation: Secondary | ICD-10-CM | POA: Diagnosis not present

## 2022-08-21 DIAGNOSIS — Z95810 Presence of automatic (implantable) cardiac defibrillator: Secondary | ICD-10-CM | POA: Diagnosis not present

## 2022-08-21 DIAGNOSIS — I428 Other cardiomyopathies: Secondary | ICD-10-CM | POA: Diagnosis not present

## 2022-08-21 NOTE — Patient Instructions (Signed)

## 2022-08-26 NOTE — Progress Notes (Signed)
Remote ICD transmission.   

## 2022-08-27 ENCOUNTER — Other Ambulatory Visit: Payer: Self-pay

## 2022-08-27 MED ORDER — ROSUVASTATIN CALCIUM 40 MG PO TABS: ORAL_TABLET | ORAL | 3 refills | Status: DC

## 2022-08-28 ENCOUNTER — Telehealth: Payer: Self-pay

## 2022-08-28 ENCOUNTER — Telehealth: Payer: Self-pay | Admitting: Primary Care

## 2022-08-28 ENCOUNTER — Encounter: Payer: Self-pay | Admitting: Primary Care

## 2022-08-28 ENCOUNTER — Ambulatory Visit (INDEPENDENT_AMBULATORY_CARE_PROVIDER_SITE_OTHER): Payer: Medicare Other | Admitting: Primary Care

## 2022-08-28 ENCOUNTER — Ambulatory Visit (INDEPENDENT_AMBULATORY_CARE_PROVIDER_SITE_OTHER): Payer: Medicare Other

## 2022-08-28 VITALS — BP 110/64 | HR 84 | Ht 70.0 in | Wt 186.4 lb

## 2022-08-28 DIAGNOSIS — J84112 Idiopathic pulmonary fibrosis: Secondary | ICD-10-CM

## 2022-08-28 DIAGNOSIS — J849 Interstitial pulmonary disease, unspecified: Secondary | ICD-10-CM | POA: Diagnosis not present

## 2022-08-28 DIAGNOSIS — J209 Acute bronchitis, unspecified: Secondary | ICD-10-CM

## 2022-08-28 DIAGNOSIS — R0989 Other specified symptoms and signs involving the circulatory and respiratory systems: Secondary | ICD-10-CM | POA: Diagnosis not present

## 2022-08-28 DIAGNOSIS — R918 Other nonspecific abnormal finding of lung field: Secondary | ICD-10-CM | POA: Diagnosis not present

## 2022-08-28 DIAGNOSIS — Z95810 Presence of automatic (implantable) cardiac defibrillator: Secondary | ICD-10-CM | POA: Diagnosis not present

## 2022-08-28 DIAGNOSIS — R042 Hemoptysis: Secondary | ICD-10-CM | POA: Diagnosis not present

## 2022-08-28 MED ORDER — PREDNISONE 10 MG PO TABS: ORAL_TABLET | ORAL | 0 refills | Status: DC

## 2022-08-28 MED ORDER — DEXTROMETHORPHAN HBR 15 MG/5ML PO SYRP: 10.0000 mL | ORAL_SOLUTION | Freq: Four times a day (QID) | ORAL | 0 refills | Status: DC | PRN

## 2022-08-28 NOTE — Patient Instructions (Addendum)
Recommendations:  - Continue Spiriva once daily - Use levalbuterol (Xopenex) every 6 hours as needed for breakthrough shortness of breath or wheezing - Take prednisone 20 mg x 5 days  - Start dextromethorphan cough syrup every 6 hours for cough suppression  - Hold off on additional antibiotics until CXR/sputum culture returns   Orders - Chest x-ray and labs today - Sputum culture  Follow-up - September with Dr. Marchelle Gearing  Acute Bronchitis, Adult  Acute bronchitis is sudden inflammation of the main airways (bronchi) that come off the windpipe (trachea) in the lungs. The swelling causes the airways to get smaller and make more mucus than normal. This can make it hard to breathe and can cause coughing or noisy breathing (wheezing). Acute bronchitis may last several weeks. The cough may last longer. Allergies, asthma, and exposure to smoke may make the condition worse. What are the causes? This condition can be caused by germs and by substances that irritate the lungs, including: Cold and flu viruses. The most common cause of this condition is the virus that causes the common cold. Bacteria. This is less common. Breathing in substances that irritate the lungs, including: Smoke from cigarettes and other forms of tobacco. Dust and pollen. Fumes from household cleaning products, gases, or burned fuel. Indoor or outdoor air pollution. What increases the risk? The following factors may make you more likely to develop this condition: A weak body's defense system, also called the immune system. A condition that affects your lungs and breathing, such as asthma. What are the signs or symptoms? Common symptoms of this condition include: Coughing. This may bring up clear, yellow, or green mucus from your lungs (sputum). Wheezing. Runny or stuffy nose. Having too much mucus in your lungs (chest congestion). Shortness of breath. Aches and pains, including sore throat or chest. How is this  diagnosed? This condition is usually diagnosed based on: Your symptoms and medical history. A physical exam. You may also have other tests, including tests to rule out other conditions, such as pneumonia. These tests include: A test of lung function. Test of a mucus sample to look for the presence of bacteria. Tests to check the oxygen level in your blood. Blood tests. Chest X-ray. How is this treated? Most cases of acute bronchitis clear up over time without treatment. Your health care provider may recommend: Drinking more fluids to help thin your mucus so it is easier to cough up. Taking inhaled medicine (inhaler) to improve air flow in and out of your lungs. Using a vaporizer or a humidifier. These are machines that add water to the air to help you breathe better. Taking a medicine that thins mucus and clears congestion (expectorant). Taking a medicine that prevents or stops coughing (cough suppressant). It is not common to take an antibiotic medicine for this condition. Follow these instructions at home:  Take over-the-counter and prescription medicines only as told by your health care provider. Use an inhaler, vaporizer, or humidifier as told by your health care provider. Take two teaspoons (10 mL) of honey at bedtime to lessen coughing at night. Drink enough fluid to keep your urine pale yellow. Do not use any products that contain nicotine or tobacco. These products include cigarettes, chewing tobacco, and vaping devices, such as e-cigarettes. If you need help quitting, ask your health care provider. Get plenty of rest. Return to your normal activities as told by your health care provider. Ask your health care provider what activities are safe for you. Keep all follow-up  visits. This is important. How is this prevented? To lower your risk of getting this condition again: Wash your hands often with soap and water for at least 20 seconds. If soap and water are not available, use  hand sanitizer. Avoid contact with people who have cold symptoms. Try not to touch your mouth, nose, or eyes with your hands. Avoid breathing in smoke or chemical fumes. Breathing smoke or chemical fumes will make your condition worse. Get the flu shot every year. Contact a health care provider if: Your symptoms do not improve after 2 weeks. You have trouble coughing up the mucus. Your cough keeps you awake at night. You have a fever. Get help right away if you: Cough up blood. Feel pain in your chest. Have severe shortness of breath. Faint or keep feeling like you are going to faint. Have a severe headache. Have a fever or chills that get worse. These symptoms may represent a serious problem that is an emergency. Do not wait to see if the symptoms will go away. Get medical help right away. Call your local emergency services (911 in the [place] Do not drive yourself to the hospital. Summary Acute bronchitis is inflammation of the main airways (bronchi) that come off the windpipe (trachea) in the lungs. The swelling causes the airways to get smaller and make more mucus than normal. Drinking more fluids can help thin your mucus so it is easier to cough up. Take over-the-counter and prescription medicines only as told by your health care provider. Do not use any products that contain nicotine or tobacco. These products include cigarettes, chewing tobacco, and vaping devices, such as e-cigarettes. If you need help quitting, ask your health care provider. Contact a health care provider if your symptoms do not improve after 2 weeks. This information is not intended to replace advice given to you by your health care provider. Make sure you discuss any questions you have with your health care provider. Document Revised: 04/12/2021 Document Reviewed: 05/03/2020 Elsevier Patient Education  2024 [employer].

## 2022-08-28 NOTE — Telephone Encounter (Signed)
Received fax from [facility] stating that pt's eligibility could not be determined because a new PA is required.   Submitted a Prior Authorization request to [facility] for ESBRIET via CoverMyMeds. Will update once we receive a response.  Key: [id number]

## 2022-08-28 NOTE — Assessment & Plan Note (Addendum)
-   Concern for progression of ILD - Due for HRCT imaging in September/October  - Continue Esbriet 2 tablets three times a day

## 2022-08-28 NOTE — Progress Notes (Signed)
@Patient  ID: [name], male    DOB: [birth year], 82 y.o.   MRN: [id number]  Chief Complaint  Patient presents with   Acute Visit    Complains of productive cough with yellow/green phleg, wheezing, sob, very fatigued Was give an antibiotic helped for about a week    Referring provider: Sheliah Hatch, MD  HPI: 82 year old male, former smoker.  Past medical history significant for hypertension, A-fib, TIA, congestive heart failure, ventricular tachycardia, COPD, IPF, OSA, hyperlipidemia, mild cognitive impairment.  Patient of Dr. Marchelle Gearing, last seen in office on 07/16/2022.   08/28/2022 Patient presents today for acute office visit due to cough. He developed a productive cough 2 weeks ago. He was switched from [place] to [place] in July due to episode of V. tach on remote monitor. He noted to have hemoptysis from 8/3 - 8/5. He completed 5-day course of Keflex, sputum cleared to yellow/brown color. Cough has been significantly worse since stopping Breztri.  He is not able to lie flat without coughing or wheezing symptoms.  He has been using Levalbuterol rescue inhaler every 4 hours.    Allergies  Allergen Reactions   Sulfa Antibiotics Rash    Immunization History  Administered Date(s) Administered   Fluad Quad(high Dose 65+) 09/23/2018, 10/05/2020, 11/08/2021   Influenza Split 11/11/2010, 11/02/2012, 10/14/2013   Influenza Whole 09/27/2011, 11/13/2011   Influenza, High Dose Seasonal PF 09/26/2015, 10/07/2016   Influenza, Seasonal, Injecte, Preservative Fre 09/23/2018   Influenza,inj,Quad PF,6+ Mos 11/03/2016, 09/20/2017   Influenza-Unspecified 09/26/2014, 10/15/2019, 11/11/2021   PFIZER Comirnaty(Gray Top)Covid-19 Tri-Sucrose Vaccine 04/24/2020   PFIZER(Purple Top)SARS-COV-2 Vaccination 02/02/2019, 02/22/2019, 10/26/2019, 09/29/2020, 11/14/2021   Pneumococcal Conjugate-13 06/15/2014   Pneumococcal Polysaccharide-23 03/15/2002, 07/10/2015   Respiratory Syncytial Virus  Vaccine,Recomb Aduvanted(Arexvy) 11/14/2021   Td 08/27/2017   Tdap 04/04/2010   Zoster Recombinant(Shingrix) 03/29/2016   Zoster, Live 05/15/2015    Past Medical History:  Diagnosis Date   Adenomatous colon polyp 2009; 06/2012   Colonoscopy 07/2007---Dr. Maryjane [facility]--Repeat 06/2012 showed one tubular adenoma w/out high grade dysplasia.   AICD (automatic cardioverter/defibrillator) present    Environmental manager   Atrial fibrillation (HCC)    Bilateral sensorineural hearing loss    BPH (benign prostatic hypertrophy)    PSAs ok (followed by urologist): no LUTS   CAD (coronary artery disease)    PTCA RCA 1997 (Inf/post MI);   CHF (congestive heart failure) (HCC)    Chronic constipation    Chronic rhinitis    ?vasomotor (Dr. Lazarus Salines)   COPD (chronic obstructive pulmonary disease) (HCC)    Diverticulosis of colon 2005   Noted on Colonoscopies in 2005 and 2009 and on CT 2010   Gallstones 06/2014   No evidence of cholecystitis on abd u/s 06/2014   GERD (gastroesophageal reflux disease)    Hepatic steatosis 2010; 2016   Noted on CT abd 2010 and on u/s abd 04/2009 (transaminasemia) and u/s 06/2014.   Hyperlipidemia    Hypertension    Ischemic cardiomyopathy    Microscopic hematuria    Cysto neg 02/2009 except for BPH-likely has microhem from his renal stone dz   Nephrolithiasis    No distal stones on CT 2010; stable 8 mm RLP stone, 3mm LMP stone 10/2013 plain film.  Abd u/s 06/2014: 9 mm nonobstructing lower pole right renal stone.  03/2015 urol: stable nonobstructing renal calc by CT 12/2014 and KUB 03/2015.   Obstructive sleep apnea    no cpap uses since weight loss of 50 pounds   Presence  of permanent cardiac pacemaker    [employer]   Squamous cell carcinoma of back    Ventricular tachycardia (HCC)     Tobacco History: Social History   Tobacco Use  Smoking Status Former   Current packs/day: 0.00   Average packs/day: 2.0 packs/day for 15.0 years (30.0 ttl pk-yrs)   Types:  Cigarettes   Start date: 01/14/1965   Quit date: 01/15/1980   Years since quitting: 42.6  Smokeless Tobacco Never   Counseling given: Not Answered   Outpatient Medications Prior to Visit  Medication Sig Dispense Refill   antiseptic oral rinse (BIOTENE) LIQD 15 mLs by Mouth Rinse route in the morning and at bedtime.     BREZTRI AEROSPHERE 160-9-4.8 MCG/ACT AERO INHALE 2 PUFFS INTO THE LUNGS IN THE MORNING AND AT BEDTIME 10.7 g 6   carvedilol (COREG) 3.125 MG tablet TAKE 1 TABLET(3.125 MG) BY MOUTH IN THE AM AND TAKE 2 TABLETS (3.125 MG) TOTAL OF 6.25 MG BY MOUTH IN THE PM WITH A MEAL. 180 tablet 3   cholecalciferol (VITAMIN D) 1000 units tablet Take 1,000 Units by mouth every evening.     ciclopirox (PENLAC) 8 % solution APPLY OVER NAIL AND SURROUNDING SKIN EVERY NIGHT AT BEDTIME. APPLY OVER PREVIOUSE COATS FOR 7 DAYS THEN REMOVE WITH ALCOHOL AND CONTINUE 6.6 mL 0   clotrimazole (MYCELEX) 10 MG troche Dissolve, swish and swallow one tablet by mouth 5 times daily for 14 days. 70 tablet 1   ENTRESTO 24-26 MG TAKE 1 TABLET BY MOUTH TWICE DAILY 180 tablet 3   eplerenone (INSPRA) 25 MG tablet TAKE 1 TABLET(25 MG) BY MOUTH DAILY 90 tablet 3   furosemide (LASIX) 40 MG tablet Take 1 tablet (40 mg total) by mouth daily. 90 tablet 3   Ginger, Zingiber officinalis, (GINGER ROOT) 550 MG CAPS Take 1 capsule by mouth daily.     ipratropium (ATROVENT) 0.06 % nasal spray Place 2 sprays into both nostrils 3 (three) times daily. 15 mL 1   JARDIANCE 10 MG TABS tablet TAKE 1 TABLET(10 MG) BY MOUTH DAILY BEFORE BREAKFAST 90 tablet 3   levalbuterol (XOPENEX HFA) 45 MCG/ACT inhaler INHALE 2 PUFFS INTO THE LUNGS EVERY 4 HOURS AS NEEDED FOR WHEEZING 15 g 6   loratadine (CLARITIN) 10 MG tablet Take 10 mg by mouth in the morning.     Magnesium 250 MG TABS Take 250 mg by mouth every evening.     mexiletine (MEXITIL) 150 MG capsule Take 2 capsules (300 mg total) by mouth 2 (two) times daily. 360 capsule 2   Multiple  Vitamin (MULTIVITAMIN WITH MINERALS) TABS Take 1 tablet by mouth every evening.     nitroGLYCERIN (NITROSTAT) 0.4 MG SL tablet DISSOLVE 1 TABLET UNDER THE TONGUE EVERY 5 MINUTES AS NEEDED FOR CHEST PAIN 25 tablet 5   Olopatadine HCl (PATADAY OP) Apply 1 drop to eye daily.     Pirfenidone (ESBRIET) 267 MG TABS Take 2 tablets (534 mg total) by mouth with breakfast, with lunch, and with evening meal. 540 tablet 1   polyvinyl alcohol-povidone (HYPOTEARS) 1.4-0.6 % ophthalmic solution Place 1-2 drops into both eyes in the morning and at bedtime.     potassium chloride SA (KLOR-CON M) 20 MEQ tablet Take 1 tablet (20 mEq total) by mouth daily with lunch. 90 tablet 3   ranolazine (RANEXA) 500 MG 12 hr tablet Take 1 tablet (500 mg total) by mouth 2 (two) times daily. 180 tablet 3   rivaroxaban (XARELTO) 20 MG TABS  tablet TAKE 1 TABLET(20 MG) BY MOUTH DAILY WITH SUPPER 90 tablet 3   rosuvastatin (CRESTOR) 40 MG tablet TAKE 1 TABLET(40 MG) BY MOUTH EVERY EVENING 90 tablet 3   Spacer/Aero-Holding Chambers (AEROCHAMBER MV) inhaler Use as instructed 1 each 0   Tiotropium Bromide Monohydrate (SPIRIVA RESPIMAT) 1.25 MCG/ACT AERS Inhale 2 puffs into the lungs daily. 4 g 5   No facility-administered medications prior to visit.   Review of Systems  Review of Systems  Constitutional: Negative.   HENT: Negative.    Respiratory:  Positive for cough and wheezing.   Cardiovascular: Negative.  Negative for chest pain and palpitations.     Physical Exam  BP 110/64 (BP Location: Right Arm, Patient Position: Sitting, Cuff Size: Normal)   Pulse 84   Ht 5\' 10"  (1.778 m)   Wt 186 lb 6.4 oz (84.6 kg)   SpO2 96%   BMI 26.75 kg/m  Physical Exam Constitutional:      Appearance: Normal appearance.  HENT:     Head: Normocephalic and atraumatic.  Cardiovascular:     Rate and Rhythm: Normal rate and regular rhythm.  Pulmonary:     Effort: Pulmonary effort is normal.     Breath sounds: Rales present. No wheezing.   Musculoskeletal:        General: Normal range of motion.  Skin:    General: Skin is warm and dry.  Neurological:     General: No focal deficit present.     Mental Status: He is alert and oriented to person, place, and time. Mental status is at baseline.  Psychiatric:        Mood and Affect: Mood normal.        Behavior: Behavior normal.        Thought Content: Thought content normal.        Judgment: Judgment normal.      Lab Results:  CBC    Component Value Date/Time   WBC 7.7 05/23/2022 1148   RBC 4.61 05/23/2022 1148   HGB 16.1 05/23/2022 1148   HGB 15.1 05/08/2020 1328   HCT 48.2 05/23/2022 1148   HCT 44.5 05/08/2020 1328   PLT 292.0 05/23/2022 1148   PLT 309 05/08/2020 1328   MCV 104.6 (H) 05/23/2022 1148   MCV 98 (H) 05/08/2020 1328   MCH 33.3 (H) 05/08/2020 1328   MCH 35.1 (H) 06/11/2017 1637   MCHC 33.5 05/23/2022 1148   RDW 14.3 05/23/2022 1148   RDW 14.1 05/08/2020 1328   LYMPHSABS 1.8 05/23/2022 1148   MONOABS 0.6 05/23/2022 1148   EOSABS 0.5 05/23/2022 1148   BASOSABS 0.1 05/23/2022 1148    BMET    Component Value Date/Time   NA 136 05/23/2022 1148   NA 139 01/09/2022 1416   K 4.9 05/23/2022 1148   CL 102 05/23/2022 1148   CO2 27 05/23/2022 1148   GLUCOSE 88 05/23/2022 1148   BUN 17 05/23/2022 1148   BUN 13 01/09/2022 1416   CREATININE 0.86 05/23/2022 1148   CREATININE 0.94 04/06/2021 1544   CALCIUM 9.0 05/23/2022 1148   GFRNONAA 72 01/18/2020 1517   GFRNONAA 75 04/19/2014 1202   GFRAA 83 01/18/2020 1517   GFRAA 87 04/19/2014 1202    BNP    Component Value Date/Time   BNP 606.7 (H) 10/03/2011 1544    ProBNP    Component Value Date/Time   PROBNP 715 (H) 01/09/2022 1416   PROBNP 1,907.0 (H) 10/05/2011 0239    Imaging: DG Chest 2 View  Result Date: 08/28/2022 CLINICAL DATA:  Hemoptysis EXAM: CHEST - 2 VIEW COMPARISON:  Chest radiograph dated 04/15/2019, CT chest dated 10/03/2021 FINDINGS: Lines/tubes: Left chest wall ICD leads  project over the right atrium and ventricle and tributary of the coronary sinus. Lungs: Lung volumes with bilateral interstitial and patchy opacities. Unchanged elevation of the right hemidiaphragm. Pleura: No pneumothorax or pleural effusion. Heart/mediastinum: The heart size and mediastinal contours are within normal limits. Bones: No acute osseous abnormality. IMPRESSION: Low lung volumes with bilateral interstitial and patchy opacities, likely corresponding to known interstitial lung disease. No focal consolidations. Electronically Signed   By: Agustin Cree M.D.   On: 08/28/2022 16:53   CUP PACEART INCLINIC DEVICE CHECK  Result Date: 08/16/2022 Complete check done by industry.  See media for full report. Rate response changes made after industry left-see this attachment for rate response changes.  Sherrlyn Hock, BSN, RN  CUP PACEART REMOTE DEVICE CHECK  Result Date: 08/16/2022 Scheduled remote reviewed. Normal device function.  There have been 6 VT episodes, 5 pace terminated with 1 burst of ATP.  Pt. was asymptomatic and Ranexa was prescribed 7/3 per EPIC.  19 NSVT Next remote 91 days. LA, CVRS    Assessment & Plan:   IPF (idiopathic pulmonary fibrosis) (HCC) - Concern for progression of ILD - Due for HRCT imaging in September/October  - Continue Esbriet 2 tablets three times a day   COPD (chronic obstructive pulmonary disease) (HCC) - Seen today for acute bronchitis. Cough has significantly worsened since changing from [place] to [place] in July 2024 due to episoded of Vtach noted on remote monitor in June. He had hemoptysis in early August, symptoms resolved after completing 5-day course of Keflex.  He continues to have a productive cough with yellow to brown sputum.  Will check chest x-ray,cmet and sputum culture today.  Likely okay to resume Breztri, however, will discuss with Dr. Marchelle Gearing.  For now we will treat with prednisone 20 mg x 5 days and dextromethorphan 10ml q 6 hours for cough  suppression.   Glenford Bayley, NP 08/28/2022

## 2022-08-28 NOTE — Assessment & Plan Note (Addendum)
-   Seen today for acute bronchitis. Cough has significantly worsened since changing from [place] to [place] in July 2024 due to episoded of Vtach noted on remote monitor in June. He had hemoptysis in early August, symptoms resolved after completing 5-day course of Keflex.  He continues to have a productive cough with yellow to brown sputum.  Will check chest x-ray,cmet and sputum culture today.  Likely okay to resume Breztri, however, will discuss with Dr. Marchelle Gearing.  For now we will treat with prednisone 20 mg x 5 days and dextromethorphan 10ml q 6 hours for cough suppression.

## 2022-08-28 NOTE — Telephone Encounter (Addendum)
Patient was changed from [facility] to [facility] in July due to vtach on remote monitoring in late June 2024. Cough is significantly worse off Breztri.  He developed hemoptysis from 8/3-8/5. He took Keflex x 5 days, sputum has now cleared to clear/brown color. He has had no further episodes of vtach. I am getting labs and CXR today. Also checking sputum culture. Gave him 20mg  prednisone x 5 days and cough suppressant. Are you ok with him starting back on Breztri or do you want to hold off?

## 2022-08-29 ENCOUNTER — Telehealth: Payer: Self-pay | Admitting: Primary Care

## 2022-08-29 LAB — COMPREHENSIVE METABOLIC PANEL
ALT: 17 U/L (ref 0–53)
AST: 24 U/L (ref 0–37)
Albumin: 3.6 g/dL (ref 3.5–5.2)
Alkaline Phosphatase: 62 U/L (ref 39–117)
BUN: 13 mg/dL (ref 6–23)
CO2: 30 meq/L (ref 19–32)
Calcium: 9.2 mg/dL (ref 8.4–10.5)
Chloride: 97 meq/L (ref 96–112)
Creatinine, Ser: 1.14 mg/dL (ref 0.40–1.50)
GFR: 60.56 mL/min (ref >60.00)
Glucose, Bld: 115 mg/dL — ABNORMAL HIGH (ref 70–99)
Potassium: 4.9 meq/L (ref 3.5–5.1)
Sodium: 134 meq/L — ABNORMAL LOW (ref 135–145)
Total Bilirubin: 0.5 mg/dL (ref 0.2–1.2)
Total Protein: 6.9 g/dL (ref 6.0–8.3)

## 2022-08-29 NOTE — Telephone Encounter (Signed)
We need to move up HRCT to this month per Dr. Marchelle Gearing please

## 2022-08-29 NOTE — Telephone Encounter (Signed)
Sounds good, thanks.

## 2022-08-29 NOTE — Telephone Encounter (Signed)
It looks like you had made the change per your note in July. He is not having overt hemoptysis, still has productive cough but not frank blood

## 2022-08-29 NOTE — Telephone Encounter (Signed)
So who changed his breztri to [facility]iva ? Assuming it is cardiology. If so, their clearance needed 2. Still with hemoptysis it is strange that and V tach have happenede. PLAN: REcommend getting his HRCT now instead of next month (last Sept 2023)

## 2022-08-29 NOTE — Telephone Encounter (Signed)
Duh! Now I remmeber it was at his request  Let us get a HRCT and then decide

## 2022-09-02 ENCOUNTER — Other Ambulatory Visit: Payer: Self-pay

## 2022-09-02 ENCOUNTER — Ambulatory Visit (HOSPITAL_BASED_OUTPATIENT_CLINIC_OR_DEPARTMENT_OTHER)
Admission: RE | Admit: 2022-09-02 | Discharge: 2022-09-02 | Disposition: A | Discharge status: Home / Self Care | Payer: Medicare Other | Source: Ambulatory Visit | Attending: Internal Medicine | Admitting: Internal Medicine

## 2022-09-02 DIAGNOSIS — J398 Other specified diseases of upper respiratory tract: Secondary | ICD-10-CM | POA: Diagnosis not present

## 2022-09-02 DIAGNOSIS — J849 Interstitial pulmonary disease, unspecified: Secondary | ICD-10-CM | POA: Diagnosis not present

## 2022-09-02 DIAGNOSIS — R918 Other nonspecific abnormal finding of lung field: Secondary | ICD-10-CM | POA: Diagnosis not present

## 2022-09-02 DIAGNOSIS — J479 Bronchiectasis, uncomplicated: Secondary | ICD-10-CM | POA: Diagnosis not present

## 2022-09-02 NOTE — Telephone Encounter (Signed)
Beth/  Please order the Ct anytime between now an dvisit with me 10/14/22  Thanks    SIGNATURE    Dr. Kalman Shan, M.D., F.C.C.P,  Pulmonary and Critical Care Medicine Staff Physician, [facility] Center Director - Interstitial Lung Disease  Program  Pulmonary Fibrosis [facility] at [facility] [place], [place], [address]   Pager: [phone], If no answer  -> Check AMION or Try [phone] Telephone (clinical office): [phone] Telephone (research): [phone]  11:21 AM 09/02/2022

## 2022-09-02 NOTE — Telephone Encounter (Signed)
New order for STAT CT has been placed. Closing encounter. nfn

## 2022-09-02 NOTE — Telephone Encounter (Signed)
Received notification from [facility] regarding a prior authorization for ESBRIET. Authorization has been APPROVED from 09/02/2022 to 08/30/2023. Approval letter sent to scan center.  Authorization # [id number]   PA approval letter has also been sent to [facility] for review, nothing further needed at this time.

## 2022-09-02 NOTE — Telephone Encounter (Signed)
Esbriet Prior [employer]   [phone]

## 2022-09-06 ENCOUNTER — Telehealth: Payer: Self-pay

## 2022-09-06 ENCOUNTER — Telehealth: Payer: Self-pay | Admitting: Internal Medicine

## 2022-09-06 NOTE — Telephone Encounter (Signed)
No cancer on CT but fibrosis is some progressive since last year. See you end of the month Sept 2024 but if you need to move it up please help him with that

## 2022-09-06 NOTE — Telephone Encounter (Signed)
Received fax from [facility] stating that pt will be ineligible for PAP in 2025. Contacted pt and provided update, explained the changes that are going into effect with Medicare in 2025 and that we would be reevaluating medication affordability for him in 2025. Stated that we would do everything we can to ensure that he is able to remain on therapy despite losing access to PAP. Pt verbalized understanding and appreciation.  Denial letter sent to scan center for retention, nothing further needed at this time.

## 2022-09-11 ENCOUNTER — Telehealth: Payer: Self-pay

## 2022-09-11 ENCOUNTER — Encounter: Payer: Medicare Other | Admitting: Pharmacist

## 2022-09-11 NOTE — Telephone Encounter (Signed)
Returned call to Pt.  We discussed the results of his recent chest CT.  Advised Pt that Dr. Marchelle Gearing had noted a recent progression in his lung fibrosis.    Advised Pt to call Dr. Marchelle Gearing office to see if he could be seen sooner.  Advised ok to change programming back, but it sounds like current symptoms may be related to lungs rather than device reprogramming.  Answered all Pt questions.  He will contact pulmonary.  Ok with leaving programming for now.

## 2022-09-11 NOTE — Telephone Encounter (Signed)
Pt wants to know if his ICD rate can be changed back down to what it was before. Pt states he feels worse than he was before the HR was changed to 80. Pt states he is more tired than he normally is and he stated SK said he might be able to stop Ranexa. I let pt know Boneta Lucks is with pts today and he insisted he talk to her. Pt also is sending in a transmission

## 2022-09-13 NOTE — Telephone Encounter (Signed)
I called and spoke with the pt and notified of results/recs per MR  He verbalized understanding  Nothing further needed

## 2022-09-15 ENCOUNTER — Other Ambulatory Visit: Payer: Self-pay | Admitting: Cardiology

## 2022-09-15 DIAGNOSIS — I059 Rheumatic mitral valve disease, unspecified: Secondary | ICD-10-CM

## 2022-09-15 DIAGNOSIS — I480 Paroxysmal atrial fibrillation: Secondary | ICD-10-CM

## 2022-09-15 DIAGNOSIS — I429 Cardiomyopathy, unspecified: Secondary | ICD-10-CM

## 2022-09-15 DIAGNOSIS — Z95810 Presence of automatic (implantable) cardiac defibrillator: Secondary | ICD-10-CM

## 2022-09-18 ENCOUNTER — Encounter: Payer: Self-pay | Admitting: Family Medicine

## 2022-09-18 ENCOUNTER — Ambulatory Visit (INDEPENDENT_AMBULATORY_CARE_PROVIDER_SITE_OTHER): Payer: Medicare Other | Admitting: Family Medicine

## 2022-09-18 VITALS — BP 116/68 | HR 65 | Temp 98.1°F | Resp 18 | Ht 70.0 in | Wt 188.1 lb

## 2022-09-18 DIAGNOSIS — L089 Local infection of the skin and subcutaneous tissue, unspecified: Secondary | ICD-10-CM

## 2022-09-18 DIAGNOSIS — T148XXA Other injury of unspecified body region, initial encounter: Secondary | ICD-10-CM | POA: Diagnosis not present

## 2022-09-18 MED ORDER — CEPHALEXIN 500 MG PO CAPS: 500.0000 mg | ORAL_CAPSULE | Freq: Three times a day (TID) | ORAL | 0 refills | Status: AC

## 2022-09-18 NOTE — Patient Instructions (Signed)
Follow up as needed or as scheduled START the Cephalexin 3x/day w/ food Keep area clean and dry- no need for ointment You can leave it open to air while at home Keep it covered while sleeping Call with any questions or concerns HAPPY BIRTHDAY!!!

## 2022-09-18 NOTE — Progress Notes (Signed)
   Subjective:    Patient ID: [name], male    DOB: Oct 07, 1940, 82 y.o.   MRN: [id number]  HPI Fall from standing- was unloading a pick up truck and fell into a wood pile.  Wife is retired Engineer, civil (consulting) and has been treating him.  Fall occurred 5 days ago.  No pain.  UTD on Tdp.  Scraped L knee and L forearm.  Wife is concerned that arm abrasion is infected.   Review of Systems For ROS see HPI     Objective:   Physical Exam Vitals reviewed.  Constitutional:      Appearance: Normal appearance.  HENT:     Head: Normocephalic and atraumatic.  Skin:    General: Skin is warm and dry.     Comments: Scabbed abrasion on L knee- no erythema, drainage, or evidence of infxn  L forearm abrasion with clean base, mild surrounding erythema, and purulent greenish drainage noted on gauze  Neurological:     Mental Status: He is alert and oriented to person, place, and time. Mental status is at baseline.  Psychiatric:        Mood and Affect: Mood normal.        Behavior: Behavior normal.        Thought Content: Thought content normal.           Assessment & Plan:  Infected wound- new.  L forearm.  Wound base itself appears clean and healthy but discolored purulent drainage on the gauze is concerning.  Start Keflex TID x7 days.  Reviewed wound care w/ pt.  Reviewed supportive care and red flags that should prompt return.  Pt expressed understanding and is in agreement w/ plan.

## 2022-09-21 ENCOUNTER — Other Ambulatory Visit: Payer: Self-pay | Admitting: Cardiology

## 2022-10-01 DIAGNOSIS — Z23 Encounter for immunization: Secondary | ICD-10-CM | POA: Diagnosis not present

## 2022-10-09 DIAGNOSIS — H26492 Other secondary cataract, left eye: Secondary | ICD-10-CM | POA: Diagnosis not present

## 2022-10-14 ENCOUNTER — Encounter: Payer: Self-pay | Admitting: Internal Medicine

## 2022-10-14 ENCOUNTER — Telehealth: Payer: Self-pay | Admitting: Internal Medicine

## 2022-10-14 ENCOUNTER — Ambulatory Visit (INDEPENDENT_AMBULATORY_CARE_PROVIDER_SITE_OTHER): Payer: Medicare Other | Admitting: Internal Medicine

## 2022-10-14 VITALS — BP 98/70 | HR 65 | Resp 16 | Ht 70.0 in | Wt 185.8 lb

## 2022-10-14 DIAGNOSIS — J8410 Pulmonary fibrosis, unspecified: Secondary | ICD-10-CM | POA: Diagnosis not present

## 2022-10-14 DIAGNOSIS — Z5181 Encounter for therapeutic drug level monitoring: Secondary | ICD-10-CM

## 2022-10-14 DIAGNOSIS — R5383 Other fatigue: Secondary | ICD-10-CM | POA: Diagnosis not present

## 2022-10-14 DIAGNOSIS — J84112 Idiopathic pulmonary fibrosis: Secondary | ICD-10-CM

## 2022-10-14 DIAGNOSIS — J439 Emphysema, unspecified: Secondary | ICD-10-CM | POA: Diagnosis not present

## 2022-10-14 DIAGNOSIS — Z7189 Other specified counseling: Secondary | ICD-10-CM

## 2022-10-14 DIAGNOSIS — I5022 Chronic systolic (congestive) heart failure: Secondary | ICD-10-CM

## 2022-10-14 DIAGNOSIS — R0609 Other forms of dyspnea: Secondary | ICD-10-CM

## 2022-10-14 DIAGNOSIS — R0600 Dyspnea, unspecified: Secondary | ICD-10-CM

## 2022-10-14 MED ORDER — PIRFENIDONE 267 MG PO TABS: 534.0000 mg | ORAL_TABLET | Freq: Three times a day (TID) | ORAL | 1 refills | Status: DC

## 2022-10-14 MED ORDER — BREZTRI AEROSPHERE 160-9-4.8 MCG/ACT IN AERO: 2.0000 | INHALATION_SPRAY | Freq: Two times a day (BID) | RESPIRATORY_TRACT | Status: DC

## 2022-10-14 NOTE — Progress Notes (Signed)
OV 10/05/2020 -transfer of care to the ILD center with Dr. Marchelle Gearing.  Patient being referred by Dr. Levy Pupa  Subjective:  Patient ID: [name], male , DOB: 1940/10/30 , age 82 y.o. , MRN: [id number] , ADDRESS: [address] [place] [address] PCP Sheliah Hatch, MD Patient Care Team: Sheliah Hatch, MD as PCP - General (Family Medicine) Jerilee Field, MD as Consulting Physician (Urology) Karie Soda, MD as Consulting Physician (General Surgery) Jens Som Madolyn Frieze, MD as Consulting Physician (Cardiology) Flo Shanks, MD as Consulting Physician (Otolaryngology) Duke Salvia, MD as Consulting Physician (Cardiology) Maris Berger, MD as Consulting Physician (Ophthalmology) Delton Coombes Les Pou, MD as Consulting Physician (Pulmonary Disease) Napoleon Form, MD as Consulting Physician (Gastroenterology) Dahlia Byes, [facility] as Pharmacist (Pharmacist)  This Provider for this visit: Treatment Team:  Attending Provider: Kalman Shan, MD    10/05/2020 -   Chief Complaint  Patient presents with   Follow-up    Pt states that he does have complaints of SOB that can happen at any time but is worse with exertion.     HPI [name] 82 y.o. -referred by Dr. Levy Pupa.  History is provided by the patient and also review of the medical records.  Patient tells me he is aware that he has ILD.  He has a significant symptom burden.  He says he is recently been told his ILD is gotten worse.  He was on amiodarone for many years.  Then approximately 18 months ago he stopped it because he got worried about the drug.  Dr. Delton Coombes notes indicate that patient has combined emphysema and interstitial lung disease.  His pulmonary function test profile shows 20% decline in FVC in the last 5 years or so with most of it coming in the last 2 years.  High-resolution CT chest only shows some decline.  High-resolution CT chest personally visualized by me and is  probable UIP.  His serologies are negative.  He is a former smoker having quit over 40 years ago.  He is Caucasian and his age is greater than 69.  Male gender.  He definitely has progressive symptoms.  He really wants a good symptom control.  He also wants understand his disease.  Today most of the focus was spent on helping him understand interstitial lung disease.  I explicitly told him that I would not be able to address symptom burden today.  He will also switch his comprehensive pulmonary care to see Dr. Marchelle Gearing myself.  He has had an MI in the past.  His atrial fibrillation is on Eliquis.  He is no longer on amiodarone.  His walking desaturation test did not show desaturation more than 3 points.   CT Chest data Sept 2022  Narrative & Impression  CLINICAL DATA:  82 year old male with history of COPD. Evaluate for interstitial lung disease.   EXAM: CT CHEST WITHOUT CONTRAST   TECHNIQUE: Multidetector CT imaging of the chest was performed following the standard protocol without intravenous contrast. High resolution imaging of the lungs, as well as inspiratory and expiratory imaging, was performed.   COMPARISON:  Chest CT 02/17/2018.   FINDINGS: Cardiovascular: Heart size is normal. There is no significant pericardial fluid, thickening or pericardial calcification. There is aortic atherosclerosis, as well as atherosclerosis of the great vessels of the mediastinum and the coronary arteries, including calcified atherosclerotic plaque in the left main, left anterior descending, left circumflex and right coronary arteries. Calcifications of  the aortic valve. Curvilinear calcifications associated with the myocardium of the inferior wall of the left ventricle in the mid ventricle and base, likely sequela of prior left circumflex coronary artery territory myocardial infarction(s). Left-sided biventricular pacemaker/AICD with lead tips terminating in the right atrial appendage, right  ventricular apex, and overlying the lateral wall the left ventricle via the coronary sinus and coronary veins.   Mediastinum/Nodes: No pathologically enlarged mediastinal or hilar lymph nodes. Please note that accurate exclusion of hilar adenopathy is limited on noncontrast CT scans. Esophagus is unremarkable in appearance. No axillary lymphadenopathy.   Lungs/Pleura: High-resolution images again demonstrate some patchy areas of ground-glass attenuation, septal thickening, subpleural reticulation, mild traction bronchiectasis and peripheral bronchiolectasis in the lungs bilaterally. No definite honeycombing. These findings have a mild craniocaudal gradient. Inspiratory and expiratory imaging is unremarkable. No acute consolidative airspace disease. No pleural effusions. Chronic elevation of the right hemidiaphragm is again noted. No definite suspicious appearing pulmonary nodules or masses are noted.   Upper Abdomen: Low-attenuation lesions are noted in the left lobe of the liver, incompletely characterized on today's non-contrast CT examination, but similar to the prior study and measuring up to 1.3 cm in diameter in segment 2, statistically likely to represent cysts.   Musculoskeletal: There are no aggressive appearing lytic or blastic lesions noted in the visualized portions of the skeleton.   IMPRESSION: 1. The appearance of the lungs is compatible with interstitial lung disease, categorized as probable usual interstitial pneumonia (UIP) per current ATS guidelines. Findings appear mildly progressive compared to the prior study. 2. Aortic atherosclerosis, in addition to left main and 3 vessel coronary artery disease. Assessment for potential risk factor modification, dietary therapy or pharmacologic therapy may be warranted, if clinically indicated. 3. There are calcifications of the aortic valve. Echocardiographic correlation for evaluation of potential valvular dysfunction  may be warranted if clinically indicated.   Aortic Atherosclerosis (ICD10-I70.0).     Electronically Signed   By: Trudie Reed M.D.   On: 09/22/2020 11:28   No results found   Results for ZOE, NORDIN (MRN [id number]) as of 10/05/2020 11:51  Ref. Range 11/14/2015 11:52 09/27/2020 12:19 09/27/2020 13:46  Anti Nuclear Antibody (ANA) Latest Ref Range: NEGATIVE  NEG  NEGATIVE  Anti JO-1 Latest Ref Range: 0.0 - 0.9 AI <0.2    Cyclic Citrullin Peptide Ab Latest Units: UNITS <16  <16  ds DNA Ab Latest Units: IU/mL 1    RA Latex Turbid. Latest Ref Range: <14 IU/mL <14 <14 <14  ENA SM Ab Ser-aCnc Latest Ref Range: <1.0 NEG AI  <1.0 NEG    ANA,IFA RA DIAG PNL W/RFLX TIT/PATN Unknown   Rpt  Ribonucleic Protein(ENA) Antibody, IgG Latest Ref Range: <1.0 NEG AI  <1.0 NEG    SSA (Ro) (ENA) Antibody, IgG Latest Ref Range: <1.0 NEG AI <1.0 NEG <1.0 NEG   SSB (La) (ENA) Antibody, IgG Latest Ref Range: <1.0 NEG AI <1.0 NEG <1.0 NEG   Scleroderma (Scl-70) (ENA) Antibody, IgG Latest Ref Range: <1.0 NEG AI  <1.0 NEG      OV 11/08/2020  Subjective:  Patient ID: [name], male , DOB: 1940/04/13 , age 4 y.o. , MRN: [id number] , ADDRESS: [address] [place] [address] PCP Sheliah Hatch, MD Patient Care Team: Sheliah Hatch, MD as PCP - General (Family Medicine) Jerilee Field, MD as Consulting Physician (Urology) Karie Soda, MD as Consulting Physician (General Surgery) Jens Som, Madolyn Frieze, MD as Consulting Physician (Cardiology) Flo Shanks,  MD as Consulting Physician (Otolaryngology) Duke Salvia, MD as Consulting Physician (Cardiology) Maris Berger, MD as Consulting Physician (Ophthalmology) Delton Coombes Les Pou, MD as Consulting Physician (Pulmonary Disease) Napoleon Form, MD as Consulting Physician (Gastroenterology) Dahlia Byes, [facility] as Pharmacist (Pharmacist)  This Provider for this visit: Treatment Team:  Attending Provider: Kalman Shan, MD    11/08/2020 -   Chief Complaint  Patient presents with   Follow-up    Pt states that he has been worried about taking the Esbriet and states that he has not started taking it yet. States his breathing is about the same.     HPI CHANLER MENDONCA 82 y.o. - here with wife to discuss disease, Wife is 55 years old. Her name is Myrnal She was a clinical trials coordinator in the late 1990s at [employer] and was involved in oncology and sildenafil trials. Lot of questions on type of fibrosis (IPF), natural course (few to several  yearS), quality of life expectations, anti fibrotics . Wants symptom relief - referred to rehab. HE is interested in support group - gave him contact number. Risk factor - prior smoking and amio. He has not started esbriet yet. Will do on 11/14/20 . This is becase he and wife went to  reunion. Ate fried food and got diarrhea. Now resolved.   [place] [facility] Comprehensive ILD Questionnaire        Past Medical History :  Also copd, cad, chf nos, osa - not using cPAP. Hx of pna > 10 years ago. Hx of renal sones + Has had 5 mRNA covid shots. Last Sept 2022. Never had covid Chronic systolic chf - ef 30-35% in June 2021 (20% in 2017) Patulous esophagus GERD Hx of amio 15--20 years. STppped 2019 Elevated R diapharagm  ROS:  Fatigue + Dry eyes + Gerd +  FAMILY HISTORY of LUNG DISEASE:  denes  PERSONAL EXPOSURE HISTORY:  Smoker [phone]. NOt smoked in 49  years  HOME  EXPOSURE and HOBBY DETAILS :  [place] yers[place] in [place]. 2 of his tile floors have mildew. Not used jacuzzi in 10 years. 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  Patient has underlying COPD and IPF.  CT chest in September 22, 2020 showed probable UIP.  And mildly progressive compared to previous study.  Patient was recommended to start on Esbriet.  Patient says he has started Esbriet and seems to be tolerating okay.  He has had a couple episodes where he has some loose stools and he took Imodium.  Unfortunately did have some constipation so he also took MiraLAX which caused him to have upset stomach.  We discussed dietary recommendations and the use of Imodium.  Patient says appetite is good.  No nausea vomiting.  Patient is on Mexiletine , his Esbriet dose will need to remain at 534 mg 3 times a day.  Patient is a been on a tapering dose and has reached his maximum maintenance dose. Patient has shortness of breath with activities.  Remains active.  Gets winded with heavy lifting or prolonged walking.  Has a daily minimally productive cough.   12/06/2020 Follow up ;  IPF, COPD  Patient returns for a 2-week follow-up.  Patient has underlying COPD.  Is on Breztri inhaler.  Patient says overall breathing is doing okay.  Feels that it has slightly improved with decreased cough.  Patient also has underlying IPF.  Recently started on Esbriet.  He is on a lower dose due to drug interactions.  Currently on 534 mg 3 times a day.  Last visit patient was having some increased stomach issues.  We discussed some dietary changes.  Patient is now eating less fried foods and higher protein.  Patient says this is made a huge difference and stomach is much better. Hepatic function panel on November 16, 2020 showed normal LFTs.   TEST/EVENTS :  09/22/2020 High-resolution CT chest compatible with interstitial lung disease probable UIP, mildly progressive compared to previous stud      OV 12/22/2020  Subjective:  Patient ID: [name], male , DOB: 29-Sep-1940 , age 61 y.o. , MRN: [id number] , ADDRESS: [address] [place] [address] PCP Sheliah Hatch, MD Patient Care Team: Sheliah Hatch, MD as PCP - General (Family Medicine) Jerilee Field, MD as Consulting Physician (Urology) Karie Soda, MD as Consulting Physician (General Surgery) Jens Som, Madolyn Frieze, MD as Consulting Physician (Cardiology) Flo Shanks, MD as Consulting Physician (Otolaryngology) Duke Salvia, MD as Consulting Physician (Cardiology) Maris Berger, MD as Consulting Physician (Ophthalmology) Delton Coombes Les Pou, MD as Consulting Physician (Pulmonary Disease) Napoleon Form, MD as Consulting Physician (Gastroenterology) Dahlia Byes, [facility] as Pharmacist (Pharmacist)  This Provider for this visit: Treatment Team:  Attending Provider: Kalman Shan, MD   IPF dx given Sept 2022 -> started esbriet  12/22/2020 -   Chief Complaint  Patient presents with   Follow-up     HPI TIMOUTHY GILARDI 82 y.o. presents with his wife.-since giving him the diagnosis of IPF and recommending pirfenidone over nintedanib [he has heart disease] he see nurse practitioner 2x1 for a face-to-face visit not a video visit.  He is on 2 pills 3 times a day of pirfenidone.  This is because he is on mexiletine [antiarrhythmic that can also cause abdominal pain, headache and nausea] because of this and CYPa12 mechanism that could inhibit pirfenidone metabolism resulting in elevated levels of pirfenidone he has been kept on 2 pills 3 times daily.  He is upset that this was not picked up by the pulmonary pharmacist but only by the cardiology pharmacist.  He reports because of this he is lost a little bit of confidence in [place].  Overall though he says that he has chronic alternating diarrhea with constipation.  He takes Imodium for the diarrhea and then this resolves and constipation.  Then he has to take MiraLAX.  This is at his baseline despite being on Esbriet it is not worse.  He says he is just frustrated by this alternating constipation and diarrhea.  Between diarrhea and constipation  he does not favor constipation.  The only side effect he is noticing from Esbriet is that he is more fatigued.  Otherwise he is overall stable as seen by the symptom score below other than fatigue.  He is also worried because he is on submaximal doses of pirfenidone that his disease could progress.  I did explain to him that even at 2 pills 3 times daily it is beneficial and with inhibitor effects of mexiletine levels are slightly higher and could potentially reflect similar to being on 3 pills 3 times daily.  Did explain  to him that he might not be able to tolerate 3 pills 3 times daily.  Given his cardiac issues nintedanib currently as second line.  We had a shared understanding and agreement on this approach.  No results found.       OV 01/19/2021  Subjective:  Patient ID: [name], male , DOB: [birth year] , age 20 y.o. , MRN: [id number] , ADDRESS: [address] [place] [address] PCP Sheliah Hatch, MD Patient Care Team: Sheliah Hatch, MD as PCP - General (Family Medicine) Jerilee Field, MD as Consulting Physician (Urology) Karie Soda, MD as Consulting Physician (General Surgery) Jens Som Madolyn Frieze, MD as Consulting Physician (Cardiology) Flo Shanks, MD as Consulting Physician (Otolaryngology) Duke Salvia, MD as Consulting Physician (Cardiology) Maris Berger, MD as Consulting Physician (Ophthalmology) Delton Coombes Les Pou, MD as Consulting Physician (Pulmonary Disease) Napoleon Form, MD as Consulting Physician (Gastroenterology) Dahlia Byes, [facility] as Pharmacist (Pharmacist)  This Provider for this visit: Treatment Team:  Attending Provider: Kalman Shan, MD    01/19/2021 -   Chief Complaint  Patient presents with   Follow-up    PFT performed today.  Pt states that he noticed with last bloodwork that his liver enzymes were elevated and also states that he has noticed some yellowing of skin.  IPF dx given Sept 2022 -> started  esbriet   HPI JABER DUNLOW 82 y.o. -returns for follow-up.  This visit is to see his pulmonary function test and his uptake with pirfenidone.  His pulmonary function test shows stability/slight improvement from September 2022 and similar to his previous baseline.  In terms of pirfenidone intake he continues to have alternating diarrhea and constipation.  In talking to him in more detail he says before his Esbriet he used to take MiraLAX once every other day and have a bowel movement [hard stool] every day once.  After this starting as.  He is now taking his MiraLAX only 1/week.  1 day he has diarrhea in 4 days he is got constipation without any bowel movement.  Therefore he is not taking his MiraLAX for his previous schedule which we discussed at the last visit.  In addition he is lost 1 pound of weight.  He is also reporting intermittent gaseous symptoms bloating acid reflux sour taste.  He takes Tums for this.  He is not taking anything for acid reflux such as PPI or H2 blockade.  He is just taking Tums.  We did discuss the idea of taking ginger capsules but he drinks ginger ale 1 bottle every day.  Did indicate to him even in previous visit that sugary drinks can cause diarrhea and potentially a carbonated drink can contribute to acid reflux.  He is agreed to stop this.  I showed him an example of a ginger capsule that he could buy at [facility] or convenience store.  Of note his Esbriet intake is 2 pills 3 times daily due to drug interaction with mexiletine. For associated emphysema is on inhaler therapy.  Of note: His AST on 01/01/2021 was 44.  Other lab calls is abnormal even though it is below 45.  He was informed of this results.  The plan is to retest today.  He does tell me that he and his wife noticed that 1 day a few weeks ago that his left finger was yellow and after that it turned out to be normal after a few days.  Due to concerns of potential jaundice and worsening LFTs  Arava and the ongoing  GI side effects have asked him to stop his Esbriet till further notice or 1 week    OV 03/09/2021  Subjective:  Patient ID: [name], male , DOB: January 04, 1941 , age 13 y.o. , MRN: [id number] , ADDRESS: [address] [place] [address] PCP Sheliah Hatch, MD Patient Care Team: Sheliah Hatch, MD as PCP - General (Family Medicine) Jerilee Field, MD as Consulting Physician (Urology) Karie Soda, MD as Consulting Physician (General Surgery) Jens Som Madolyn Frieze, MD as Consulting Physician (Cardiology) Flo Shanks, MD as Consulting Physician (Otolaryngology) Duke Salvia, MD as Consulting Physician (Cardiology) Maris Berger, MD as Consulting Physician (Ophthalmology) Delton Coombes Les Pou, MD as Consulting Physician (Pulmonary Disease) Napoleon Form, MD as Consulting Physician (Gastroenterology) Erroll Luna, [facility] (Pharmacist)  This Provider for this visit: Treatment Team:  Attending Provider: Kalman Shan, MD    03/09/2021 -   Chief Complaint  Patient presents with   Follow-up    Pt states he is about the same since last visit. States he recently began pulmonary rehab.   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 -returns for his IPF follow-up.  He has further weight loss.  He is now down to 199 pounds although he is still overweight.  His BMI is over 28.  He is confident that his weight loss is because of medications.  It is otherwise drug-induced weight loss.  He believes the pirfenidone has reduced his appetite.  In addition Jardiance is also causing weight loss.  At this point in time he is happy with his weight loss because he is still overweight.  Overall he is tolerating his pirfenidone well.  He was having alternating diarrhea and constipation but after taking my  suggestion and trialing out different regimens he is now settled on taking omeprazole along with ginger and MiraLAX in a scheduled way.  This is now helped prevent the alternating diarrhea and constipation.  He is quite happy with this.  He takes his pirfenidone at 2 pills 3 times daily and 5-6 hours apart.  It does interfere with the social schedule.  Sometimes family wants to have have dinner or lunch at different times.  He is asking for some flexibility.  I encouraged him to try taking it slightly out of schedule and also with different types of snacks and experiment to see whether he has any side effects.  I did indicate to him ultimately the side effects of what will determine his tolerance.  He tells me that the omeprazole is doing quite well to control the GERD.  However the over-the-counter blister pack says it is only for 14 days.  He took clarification on this.  We discussed the long-term effects of omeprazole but overall taking balance versus risk I recommended that he continue his omeprazole for now.  In terms of his symptoms he stable.  He is attending pulmonary rehabilitation.  He says it is helping him.  His wife used to be a clinical trials coordinator.  He is interested in clinical trials.  However at this point in time he says his son is requested that he take a second opinion at [facility].  He is seeing Dr. Marcelene Butte on 03/14/2021.  I did indicate to him that we are all part of  the pulmonary fibrosis foundation network.  I did indicate to him that Dr. Jon Billings is a well-known expert.  I have also indicated to him that I support the second opinion and I have personally emailed Dr. Jon Billings.  I have asked the patient to take CD-ROM of the CT scan to [facility].  Recent lab work 7 days ago shows normal creatinine and normal liver function test.  D[facility] Dr Wetzel Bjornstad 03/14/21  Mr. Marhefka is a very pleasant 82 y.o.-year-old male with no significant prior pulmonary problems as a child or  younger adult. He also has history of CAD, ICM/chronic systolic/diastolic HF s/p ICD, Afib on mexiletine, HTN, HLD. In 2021, he first noticed worsening shortness of breath on exertion but also attributed to COPD and Afib. Think it has been gradually worsening over the past 6 months but denies any recent illnesses. He was initially diagnosed based on imaging showing probable UIP during management of COPD. Followed by Dr. Mickel Crow. Never treated with steroids. Negative autoimmune panel in 2017 and 09/2020. He was previously on amiodarone for 15-20 years and stopped in 2019 due to concerns for ILD. Started on Esbriet in Sept 2022. Initially had a lot of diarrhea and constipation, and now more tolerable with less GI issues since starting omeprazole and ginger root and miralax 2-3x /week. Wears sunscreen and UV protective clothing.   He reports breathing is ok. Biggest problem is dry mouth and cough productive light yellow/green sputum chronic for the past 4 years attributed to COPD and chronic sinus drainage. Takes Breztri, albuterol, ipratropium nasal sprays which helps. Able to clear sputum. Started pulmonary rehab about 3 weeks ago in mid Feb 2023 which has helped a lot. Doing it twice a week, first 15 minutes walking, 2nd 15 minutes with stationary elliptical, then stretching but has to rest 2-3 hours at home. No fever/chills/night sweats. Lost weight 211 to 193lb since starting Esbriet.  His current exercise capacity includes walking 15 minute every morning [place]   Mr. Raatz is a very pleasant 82 y.o.-year-old male with no significant prior pulmonary problems as a child or younger adult. He also has history of CAD, ICM/chronic systolic/diastolic HF s/p ICD, Afib on mexiletine, HTN, HLD. In 2021, he first noticed worsening shortness of breath on exertion but also attributed to COPD and Afib. Think it has been gradually worsening over the past 6 months but denies any  recent illnesses. He was initially diagnosed based on imaging showing probable UIP during management of COPD. Followed by Dr. Mickel Crow. Never treated with steroids. Negative autoimmune panel in 2017 and 09/2020. He was previously on amiodarone for 15-20 years and stopped in 2019 due to concerns for ILD. Started on Esbriet in Sept 2022. Initially had a lot of diarrhea and constipation, and now more tolerable with less GI issues since starting omeprazole and ginger root and miralax 2-3x /week. Wears sunscreen and UV protective clothing.   He reports breathing is ok. Biggest problem is dry mouth and cough productive light yellow/green sputum chronic for the past 4 years attributed to COPD and chronic sinus drainage. Takes Breztri, albuterol, ipratropium nasal sprays which helps. Able to clear sputum. Started pulmonary rehab about 3 weeks ago in mid Feb 2023 which has helped a lot. Doing it twice a week, first 15 minutes walking, 2nd 15 minutes with stationary elliptical, then stretching but has to rest 2-3 hours at home. No fever/chills/night sweats. Lost weight 211 to 193lb since starting Esbriet.  His current exercise capacity includes walking 15 minute every morning [place]    Mr. Phifer is a 82 y.o. 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 He has overall been stable to very slightly progressed on imaging and PFTs for more than 6-8 years which is atypical for IPF. [place] panel. Negative autoimmune labs. He has been appropriately managed with Esbriet, dose reduced due to interactions with mexiletine, as well as pulmonary rehab. He is also being closely followed by cardiology for systolic HF with last echo in 1610 showing no RV dysfunction. Recommend to continue current dose of Esbriet and encouraged  to stay active while at home.  He also has a large esophagus on imaging and he reports severe acid reflux prior to starting Esbriet that is now better controlled on Esbriet. Aspiration and reflux unlikely to be primary cause of fibrosis but may be exacerbating his disease in predominantly basilar distribution so educated patient on aspiration precautions including raising HOB at least 30 degrees and not eating meals at least 2-3 hours before bedtime. He will try to nap in his recliner with head raised instead of lying flat in bed.   OV 05/07/2021  Subjective:  Patient ID: [name], male , DOB: August 16, 1940 , age 75 y.o. , MRN: [id number] , ADDRESS: [address] [place] [address] PCP Sheliah Hatch, MD Patient Care Team: Sheliah Hatch, MD as PCP - General (Family Medicine) Jerilee Field, MD as Consulting Physician (Urology) Karie Soda, MD as Consulting Physician (General Surgery) Jens Som Madolyn Frieze, MD as Consulting Physician (Cardiology) Flo Shanks, MD as Consulting Physician (Otolaryngology) Duke Salvia, MD as Consulting Physician (Cardiology) Maris Berger, MD as Consulting Physician (Ophthalmology) Delton Coombes Les Pou, MD as Consulting Physician (Pulmonary Disease) Napoleon Form, MD as Consulting Physician (Gastroenterology) Erroll Luna, [facility] (Pharmacist) Yetta Glassman, RN as Case Manager  This Provider for this visit: Treatment Team:  Attending Provider: Kalman Shan, MD    05/07/2021 -   Chief Complaint  Patient presents with   Follow-up    Pt states he has been doing well since last visit and denies any complaints.    HPI ISAAC DUBIE 82 y.o. -returns for follow-up.  After last visit he did see Dr. Marcelene Butte at [facility].  I reviewed the notes.  Really appreciate Dr. Jon Billings adding an extra perspective.  His notes are captured above.  Patient continues on pirfenidone at 2 pills 3 times daily in the  setting of mexiletine.  He has completed rehabilitation.  Has had 5 COVID boosters.  He asked me about taking more booster.  I recommended to hold off but again emphasized his risk benefit ratio.  Did express that if he got COVID he could take antiviral.  We should wait till a new vaccine for the new strains comes up.  At this point in time the vaccine is for the old strains.  He feels overall stable after taking pirfenidone and also pulmonary rehabilitation.  He says this is the best he is felt.  He continues with his alternating diarrhea and constipation but it is actually improved.  Because of this he does not want to do any clinical trial that might have GI side effects.  He is interested in clinical trials but at  this point in time he wants to hold off.  He got a letter from Dr. Lavon Paganini about undergoing colonoscopy.  I did approve this.  He continues to lose weight he says this is unintentional and account of pirfenidone and his diabetic drug.  Currently the level of weight loss is healthy.  Of note he has chronic systolic heart failure.  Last echocardiogram was in 2021.  He is going to see Dr. Jens Som in June 2023.  I did recommend getting an echocardiogram before he saw Dr. Jens Som and I will order this I had of Dr. Ludwig Clarks visit.  He is okay with this plan.  He wants to have this done . HAve this done in [place] area    CT Chest data  No results found.    PFT  OV 08/03/2021  Subjective:  Patient ID: [name], male , DOB: May 11, 1940 , age 9 y.o. , MRN: [id number] , ADDRESS: [address] [place] [address] PCP Sheliah Hatch, MD Patient Care Team: Sheliah Hatch, MD as PCP - General (Family Medicine) Jerilee Field, MD as Consulting Physician (Urology) Karie Soda, MD as Consulting Physician (General Surgery) Jens Som Madolyn Frieze, MD as Consulting Physician (Cardiology) Flo Shanks, MD as Consulting Physician (Otolaryngology) Duke Salvia, MD  as Consulting Physician (Cardiology) Maris Berger, MD as Consulting Physician (Ophthalmology) Delton Coombes Les Pou, MD as Consulting Physician (Pulmonary Disease) Napoleon Form, MD as Consulting Physician (Gastroenterology) Erroll Luna, [facility] (Pharmacist)  This Provider for this visit: Treatment Team:  Attending Provider: Kalman Shan, MD    08/03/2021 -   Chief Complaint  Patient presents with   Follow-up    Follow-up IPF cough, SOB, Wheezing     HPI [name] 82 y.o. -+ returns for follow-up.  Since his last visit he continues to be stable from a dyspnea standpoint.  Is undergoing pulmonary rehabilitation.  On the pulmonary function test is a little bit waxing and waning but I would say the overall trend in the last 1 year appears stable although definitely progressive in the last 3 years.  It appears that since starting pirfenidone he is likely stable.  He is on the lower dose pirfenidone.  The main issue is that he is struggling with the pirfenidone.  His fatigue is a little more than usual.  He is also having alternating constipation and diarrhea.  However he is able to be stable on this.  Also he says that since attending rehabilitation his got some right infrascapular infra axillary muscle pull symptoms.  He is wondering if it is because of rehabilitation.  His CT scan late last year did not show any abnormalities consistent with a mass.  He has had weight loss because of pirfenidone and also Jardiance.  Although he feels this is stabilized.  He feels it is in a happy point.  He had an echocardiogram and his ejection fraction is 35%.   PFT   OV 11/08/2021  Subjective:  Patient ID: [name], male , DOB: Mar 10, 1940 , age 4 y.o. , MRN: [id number] , ADDRESS: [address] [place] [address] PCP Sheliah Hatch, MD Patient Care Team: Sheliah Hatch, MD as PCP - General (Family Medicine) Jerilee Field, MD as Consulting Physician  (Urology) Karie Soda, MD as Consulting Physician (General Surgery) Jens Som Madolyn Frieze, MD as Consulting Physician (Cardiology) Flo Shanks, MD as Consulting Physician (Otolaryngology) Duke Salvia, MD as Consulting Physician (Cardiology) Maris Berger, MD as Consulting Physician (  Ophthalmology) Leslye Peer, MD as Consulting Physician (Pulmonary Disease) Napoleon Form, MD as Consulting Physician (Gastroenterology) Erroll Luna, [facility] (Pharmacist)  This Provider for this visit: Treatment Team:  Attending Provider: Kalman Shan, MD   11/08/2021 -   Chief Complaint  Patient presents with   Follow-up    Follow-up for CT PT states no changes with breathing     HPI [name] 82 y.o. -returns for follow-up.  Presents with his wife.  Wife is returning after a long time.  She is to work in clinical trials.  He is feeling stable although he is continue to lose weight.  He feels the low-dose of Esbriet is being well-tolerated except for significant fatigue which she attributes to pirfenidone.  He is continue to lose weight which is a combination of Jardiance for his heart failure and also his pirfenidone.  He still technically overweight with a current weight with a BMI of 26 but he is getting close to ideal body weight.  He is aware that if he starts going below ideal body weight then we will have to make decisions about stopping pirfenidone or Jardiance.  Last visit there was some confusion but this pulmonary function test was worsening so we got a high-resolution CT chest and this shows stability for the last 1 year suggesting beneficial effects of pirfenidone/Esbriet.  However he does have progressive disease over the course of few years.  His GI symptoms are more stable at this point.  Even though he has a lot of fatigue he is willing to continue with pirfenidone  He is interested in clinical trials as a care option.  His wife worked in clinical trials he  understands this experimental nature of clinical trials with drug development he understands the placebo concept.  We discussed the fact we have 5 trials going on.  I do not recommend 2 which had significant GI side effects.  He does not qualify for cough study because he is on carvedilol and this drug interactions.  He is aware that studies of strict inclusion exclusion criteria and his medical problems from his heart or EKG or drug interaction issues could preclude him.  With that in mind we discussed the potential of the following studies    Inhaled treprostinil: Preliminary study shows beneficial effects and IPF with lung function improvement.  This medicine is available in the market for many years for pulmonary hypertension.  He will have to take it as a nebulizer 4 times daily and each time 9-12 times.  The study visits are not too cumbersome.  However he finds this inconvenient from a medication administration standpoint  Subcutaneous injection monoclonal antibody Vixarelimab: Every 2 weeks.  Evidence is only on prurigo appears 1 yesterday with open label extension.  Injections every 2 weeks the significant number of visits.  We discussed side effect profile on this based on established literature.  He is open to this idea but wants a second choice.  50% chance of placebo   Oral tablet Bexotegrast - Pliant study.  33% chance of placebo.  Well-tolerated in the early phase to 35-month study.  Minimal GI side effects.  Initial improvement over 4 weeks and then some tailing off on preliminary data but still significant improvement compared to placebo.  He is interested in this.  The study is not activated at our location. I gave him publicly available information of the above studies to him and his wife.    OV 02/21/2022  Subjective:  Patient ID: MANI CELESTIN, male , DOB: [birth year] , age 80 y.o. , MRN: [id number] , ADDRESS: [address] [place] [address] PCP Sheliah Hatch,  MD Patient Care Team: Sheliah Hatch, MD as PCP - General (Family Medicine) Jerilee Field, MD as Consulting Physician (Urology) Karie Soda, MD as Consulting Physician (General Surgery) Jens Som Madolyn Frieze, MD as Consulting Physician (Cardiology) Flo Shanks, MD as Consulting Physician (Otolaryngology) Duke Salvia, MD as Consulting Physician (Cardiology) Maris Berger, MD as Consulting Physician (Ophthalmology) Delton Coombes Les Pou, MD as Consulting Physician (Pulmonary Disease) Napoleon Form, MD as Consulting Physician (Gastroenterology) Erroll Luna, [facility] (Pharmacist)  This Provider for this visit: Treatment Team:  Attending Provider: Kalman Shan, MD    02/21/2022 -   Chief Complaint  Patient presents with   Follow-up    Review PFT today.  Cough persistent.  Only able to do minimal exercises due to fatigue and SOB.  Patient would like to have a spacer device to use with Breztri   %  HPI KINGJAMES COURY 82 y.o. -reports for followup.  Overall feels stable but reporting fatigue and ongoing weight loss. We discussed the possibilities that this is esbriet which Is very likely. However, today walking Pacer HR stuck at 60/min. He is also on Jardiance which contributes to weight loss. Weight currently at Ideal body weight. We discussed that any further weight loss a-> then we would have to stop esbriet or jardiance ((likely esbriet) for a 1-2 months and then see if weight goes up.He does not like the idea of stopping esbriet.   So, we diuscussed calorie count and improving nutrition  He has also decided against participating in clniical trial - His QTcF baseline is prolonged anyways. He is also asking for a spacer for Breztri      OV 07/16/2022  Subjective:  Patient ID: [name], male , DOB: September 26, 1940 , age 55 y.o. , MRN: [id number] , ADDRESS: [address] [place] [address] PCP Sheliah Hatch, MD Patient Care Team: Sheliah Hatch, MD as PCP - General (Family Medicine) Jerilee Field, MD as Consulting Physician (Urology) Karie Soda, MD as Consulting Physician (General Surgery) Lewayne Bunting, MD as Consulting Physician (Cardiology) Flo Shanks, MD as Consulting Physician (Otolaryngology) Duke Salvia, MD as Consulting Physician (Cardiology) Maris Berger, MD as Consulting Physician (Ophthalmology) Delton Coombes Les Pou, MD as Consulting Physician (Pulmonary Disease) Napoleon Form, MD as Consulting Physician (Gastroenterology) Erroll Luna, [facility] (Inactive) (Pharmacist)  This Provider for this visit: Treatment Team:  Attending Provider: Kalman Shan, MD    07/16/2022 -   Chief Complaint  Patient presents with   Follow-up    F/up on PFT      HPI MONTAVIOUS WIERZBA 82 y.o. -returns for follow-up.  This is a routine follow-up.  But he does have an interim acute issue.  He says last week 3 times his AICD did not go off but he felt tachycardic.  He then got a call alert saying he had V. tach.  It appears the pacemaker prevented the V. tach.  He is really worried about this.  He has upcoming appointment Dr. Hurman Horn.  I did review the external records yesterday from cardiology and they have increased his carvedilol.  He is wondering if his pulmonary issues or any of his medications could be putting him at risk for V. tach.  In addition he also states that despite this, he has been in  the yard last week with his golf cart and stayed out a long time.  He did sweat he did get a little dehydrated.  I did indicate to him this might be the reason may be some electrolyte imbalance.  In addition he is on Breztri for his mild COPD.  He does have long-acting beta agonist although the safety record of this does not show any V. tach I did indicate to him that we could just push to Spiriva.  He is fine with this.    Other issues - From a respiratory standpoint he feels stable but symptom scores  below show worsening. But sit stand hypoxemia test he did not desaturate.  But his pulmonary function test does show decline.    - From a pirfenidone tolerance standpoint he is tolerating it well.  He is improved his diet by taking MiraLAX 3-4 times a day.  He is also eating fish 3 times a week and a lot of fruits and vegetables.  With this his bowel regimen is improved.  He is no longer having diarrhea.  He has some intermittent constipation but he feels overall he is in a good spot.  This because he used to suffer from significant alternating.  - re Esbriet/Pirfenidone requires intensive drug monitoring due to high concerns for Adverse effects of , including  Drug Induced Liver Injury, significant GI side effects that include but not limited to Diarrhea, Nausea, Vomiting,  and other system side effects that include Fatigue, headaches, weight loss and other side effects such as skin rash. These will be monitored with  blood work such as LFT initially once a month for 6 months and then quarterly   -Acid reflux: His not taking his omeprazole because of fears of kidney side effects  -Weight loss: He says he is eating a lot of ice cream and he stopped losing weight.  He has now gained some weight.   Symptoms:   OV 10/14/2022  Subjective:  Patient ID: [name], male , DOB: 08-07-1940 , age 24 y.o. , MRN: [id number] , ADDRESS: [address] [place] [address] PCP Sheliah Hatch, MD Patient Care Team: Sheliah Hatch, MD as PCP - General (Family Medicine) Jerilee Field, MD as Consulting Physician (Urology) Karie Soda, MD as Consulting Physician (General Surgery) Jens Som Madolyn Frieze, MD as Consulting Physician (Cardiology) Flo Shanks, MD as Consulting Physician (Otolaryngology) Duke Salvia, MD as Consulting Physician (Cardiology) Maris Berger, MD as Consulting Physician (Ophthalmology) Leslye Peer, MD as Consulting Physician (Pulmonary  Disease) Napoleon Form, MD as Consulting Physician (Gastroenterology) Erroll Luna, [facility] (Inactive) (Pharmacist)  This Provider for this visit: Treatment Team:  Attending Provider: Kalman Shan, MD    10/14/2022 -   Chief Complaint  Patient presents with   Follow-up    ILD- cough is bad now about 50% dry and 50% wet    IPF dx given Sept 2022 -> started esbriet   - CT last Sept 2023: stable since sept 2022 but worse since 2020  COPD associated  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  Having excellent high-fiber fish based diet.  GErd on ppi  Chronic systolic heart failure S/p pacer - permananet A Fib Prolonged QTc [December 2023 QTc 60 ms in August 2020 for final milliseconds]. Hx V Tach  -2017 ejection fraction 25%  -May 2023 ejection  fraction 30-35   Esbriet/Pirfenidone requires intensive drug monitoring due to high concerns for Adverse effects of , including  Drug Induced Liver Injury, significant GI side effects that include but not limited to Diarrhea, Nausea, Vomiting,  and other system side effects that include Fatigue, headaches, weight loss and other side effects such as skin rash. These will be monitored with  blood work such as LFT initially once a month for 6 months and then quarterly   HPI ALYN JURNEY 82 y.o. -last seen in July 2024.  Presents with his son Will who is the younger of the 2 sons.  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  At the time of his last visit he reported an episode of ventricular tachycardia and just out of abundance of precaution we stopped his Markus Daft and put him on Spiriva.  But he says his dyspnea is worse after the change.  He wants to go back to [place].  I did indicate to him the literature shows that the risk of ventricular tachycardia and  long-acting beta agonist is negligible.  Therefore he wants to go back on Salvo.  For his interval ventricular tachycardia is now taking ranolazine and therefore it is improved this is according to chart review from Dr. Hurman Horn visit visit August 16, 2022.  Then on August 17, 2022 he did call us with some hemoptysis and discolored sputum.  We called in some cephalexin.  He did follow-up with Dr. Olga Millers cardiology on August 21, 2022.  For his heart failure was advised to continue Jardiance Pyrolite on Lasix.  He was also asked to continue Entresto and carvedilol.  He was also asked to continue his Xarelto and statins.  For his ventricular tachycardia was going to continue his beta-blocker and mexiletine along with ranolazine.  He then followed up with nurse practitioner August 28, 2022.  He did report significant worsening of cough since stopping Breztri a lot of wheezing as well.  Increase use of lev albuterol.  We called in a short course prednisone.  With this is hemoptysis resolved.  We then ordered a high-resolution CT chest.  No nodules.  The ILD slowly progressive over the last 1 year.  I did inform today of his progressive nature of pulmonary fibrosis.  Then on September 18, 2022 he had a new wound in his left forearm.  Primary care Dr. Beverely Low gave him 7 days of cephalexin.  He is here to discuss all this today.  In addition he tells me that he is going to get into the donut hole for pirfenidone.  The program is ending.  His son said the catastrophic insurance coverage of $3000 should not be a problem.  But patient wants me to touch base with pharmacy I have sent them a message.    Discussed clinical trials as a care option    - Subcutaneous injection monoclonal antibody Vixarelimab: Every 2 weeks.  The study is no longer enrolling patients who are on standard of care therapy.   - Oral tablet Bexotegrast - Pliant study.  33% chance of placebo.  Well-tolerated in the early phase to  2-month study.  Minimal GI side effects.  Initial improvement over 4 weeks and then some tailing off on preliminary data but still significant improvement compared to placebo.  He is interested in this.  However personally visualize EKGs August 2024 QTc is 4 ms in December 2023 it was 600 ms.    - -  Inhaled treprostinil study 301 by [employer]..: Preliminary study shows beneficial effects and IPF with lung function improvement.  This medicine is available in the market for many years for pulmonary hypertension.  He will have to take it as a nebulizer 4 times daily and each time 9-12 times.  The study visits are not too cumbersome.  However in the past he found it inconvenient from a medication administration standpoint.  Given his decline there was a study team to prescreen him for this.  This might be his best option provided cardiac issues are not a contraindication.   SYMPTOM S CALE - ILD 11/08/2020 12/22/2020 Esbriet since sept/pct 2022 01/19/2021 Esbreit 2 pills tid 03/09/2021  05/07/2021  08/03/2021  11/08/2021  02/21/2022  07/16/2022  10/14/2022   Current weight 209# 207# 206# 199# 194# 190# - esbriet 185# - esbrieet, jardiance 183# 191# 185# ebiret . Off breztri  O2 use ra ra          Shortness of Breath 0 -> 5 scale with 5 being worst (score 6 If unable to do)           At rest 0 0 0 0 0 0 0 0 0 3   0Simple tasks - showers, clothes change, eating, shaving 0 0 0 0 0 0 1 0 0 2   Household (dishes, doing bed, laundry) 0 0 0 0 0 0 1 0 1 2   Shopping 3 2 0 4 4 3 5 4 5 5   Walking level at own pace 4 2 2 1 1 1 2 3 4 5   Walking up Stairs 5 5 3 4 4 4 4 5 4 5   Total (30-36) Dyspnea Score 12 x 2 years  same Since onset 9 5 9 9 8 13 12 15 21   How bad is your cough? moderate x  2years. Getting worse. Has light yellow/green sputum. Clears throat 1 4 4 3 4 4 5 5 5   How bad is your fatigue light 5 4 4 3 5 4 3 4 5   How bad is nausea 0 1 2 1 0 3 2 0 0 0   How bad is vomiting?  0 0 0 0 0 0 0 0 00 0    How bad is diarrhea? 0 4 5 1 2 4 4 4 0 0   How bad is anxiety? 0 4 3 1 0 2 0 0 0 3   How bad is depression 0 1 2 0 0 0 0 0 0 1   Any chronic pain - if so where and how bad 0     x x  0      Simple office walk 185 feet x  3 laps goal with forehead probe 10/05/2020  03/09/2021  08/03/2021  02/21/2022  07/16/2022  10/14/2022   O2 used ra ra ra ra ra ra  Number laps completed 3 3 3 2  Sit stand Sistt sand x 10  Comments about pace avg avg nl     Resting Pulse Ox/HR 100% and 70/min 99% and 70 97% and HR 60 99% 96% and HR 71 97% and HR 77  Final Pulse Ox/HR 97% and 82/min 97% and 82 98% and HR 72 97% 95% and HR 60 95% and HR 68  Desaturated </= 88% no no np     Desaturated <= 3% points yes no no     Got Tachycardic >/= 90/min no no no     Symptoms at end of test Mild dyspnea no no  no  Miscellaneous comments x x         PFT     Latest Ref Rng & Units 07/16/2022    2:24 PM 02/21/2022    2:53 PM 08/03/2021    2:41 PM 01/19/2021   12:54 PM 09/27/2020    9:51 AM 02/09/2018    1:53 PM 03/12/2017    1:55 PM  ILD indicators  FVC-Pre L 2.28  2.46  2.43  2.67  2.34  3.02  2.82   FVC-Predicted Pre % 56  58  57  63  55  69  64   FVC-Post L     2.44  2.97  2.78   FVC-Predicted Post %     57  67  63   TLC L     4.17  4.65  4.94   TLC Predicted %     57  64  68   DLCO uncorrected ml/min/mmHg 14.04  15.74  14.82  14.09  14.62  18.10  18.46   DLCO UNC %Pred % 57  62  58  55  57  53  54   DLCO Corrected ml/min/mmHg 13.50  15.74  14.82  14.09  14.62  17.90    DLCO COR %Pred % 55  62  58  55  57  53        LAB RESULTS last 96 hours No results found.  LAB RESULTS last 90 days Recent Results (from the past 2160 hour(s))  Pulmonary function test     Status: None   Collection Time: 07/16/22  2:24 PM  Result Value Ref Range   FVC-Pre 2.28 L   FVC-%Pred-Pre 56 %   FEV1-Pre 1.92 L   FEV1-%Pred-Pre 66 %   FEV6-Pre 2.27 L   FEV6-%Pred-Pre 60 %   Pre FEV1/FVC ratio 84 %   FEV1FVC-%Pred-Pre 117 %   Pre  FEV6/FVC Ratio 99 %   FEV6FVC-%Pred-Pre 106 %   FEF 25-75 Pre 2.54 L/sec   FEF2575-%Pred-Pre 128 %   DLCO unc 14.04 ml/min/mmHg   DLCO unc % pred 57 %   DLCO cor 13.50 ml/min/mmHg   DLCO cor % pred 55 %   DL/VA 6.96 ml/min/mmHg/L   DL/VA % pred 99 %  Hepatic function panel     Status: None   Collection Time: 07/16/22  4:12 PM  Result Value Ref Range   Total Bilirubin 0.6 0.2 - 1.2 mg/dL   Bilirubin, Direct 0.1 0.0 - 0.3 mg/dL   Alkaline Phosphatase 57 39 - 117 U/L   AST 26 0 - 37 U/L   ALT 24 0 - 53 U/L   Total Protein 6.7 6.0 - 8.3 g/dL   Albumin 3.8 3.5 - 5.2 g/dL  CUP PACEART REMOTE DEVICE CHECK     Status: None   Collection Time: 08/16/22  1:34 AM  Result Value Ref Range   Date Time Interrogation Session [id number]    Pulse Generator Manufacturer BOST    Pulse Gen Model G125 MOMENTUM CRT-D    Pulse Gen Serial Number [id number]    Clinic Name [facility]    Implantable Pulse Generator Type Cardiac Resynch Therapy Defibulator    Implantable Pulse Generator Implant Date [id number]    Implantable Lead Manufacturer [facility]    Implantable Lead Model 4194 Attain OTW    Implantable Lead Serial Number [id number]    Implantable Lead Implant Date [id number]    Implantable Lead Location Detail 1 Lateral Wall    Implantable Lead Location [id number]  Implantable Lead Connection Status [id number]    Implantable Lead Manufacturer GUIC    Implantable Lead Model 0148 Endotak Reliance    Implantable Lead Serial Number [id number]    Implantable Lead Implant Date 78295621    Implantable Lead Location Detail 1 APEX    Implantable Lead Special Function Diagnosis:  Sustained monomorphic VT    Implantable Lead Location [id number]    Implantable Lead Connection Status [id number]    Implantable Lead Manufacturer MERM    Implantable Lead Model 6940 CapSureFix    Implantable Lead Serial Number [id number]    Implantable Lead Implant Date 30865784    Implantable Lead Location Detail 1 APPENDAGE    Implantable Lead  Special Function Diagnosis:  Sustained monomorphic VT    Implantable Lead Location [id number]    Implantable Lead Connection Status [id number]    Lead Channel Setting Sensing Sensitivity 0.6 mV   Lead Channel Setting Sensing Adaptation Mode Adaptive Sensing    Lead Channel Setting Sensing Sensitivity 1.0 mV   Lead Channel Setting Sensing Adaptation Mode Adaptive Sensing    Lead Channel Setting Pacing Pulse Width 0.4 ms   Lead Channel Setting Pacing Amplitude 2.5 V   Lead Channel Setting Pacing Pulse Width 1.0 ms   Lead Channel Setting Pacing Amplitude 2.5 V   Lead Channel Setting Pacing Capture Mode Fixed Pacing    Zone Setting Status Active    Zone Setting Status Active    Lead Channel Impedance Value 680 ohm   Lead Channel Impedance Value 654 ohm   HighPow Impedance 43 ohm   Battery Status BOS    Battery Remaining Longevity 102 mo   Battery Remaining Percentage 100 %   Brady Statistic RA Percent Paced 0 %   Brady Statistic RV Percent Paced 95 %  CUP PACEART INCLINIC DEVICE CHECK     Status: None   Collection Time: 08/16/22 12:26 PM  Result Value Ref Range   Pulse Generator Manufacturer BOST    Date Time Interrogation Session [phone]    Pulse Gen Model G125 MOMENTUM CRT-D    Pulse Gen Serial Number [id number]    Clinic Name [facility]    Implantable Pulse Generator Type Cardiac Resynch Therapy Defibulator    Implantable Pulse Generator Implant Date [id number]    Implantable Lead Manufacturer [facility]    Implantable Lead Model 4194 Attain OTW    Implantable Lead Serial Number [id number]    Implantable Lead Implant Date 47425956    Implantable Lead Location Detail 1 Lateral Wall    Implantable Lead Location [id number]    Implantable Lead Connection Status [id number]    Implantable Lead Manufacturer GUIC    Implantable Lead Model 0148 Endotak Reliance    Implantable Lead Serial Number [id number]    Implantable Lead Implant Date 38756433    Implantable Lead Location Detail 1 APEX     Implantable Lead Special Function Diagnosis:  Sustained monomorphic VT    Implantable Lead Location [id number]    Implantable Lead Connection Status [id number]    Implantable Lead Manufacturer [facility]    Implantable Lead Model 6940 CapSureFix    Implantable Lead Serial Number [id number]    Implantable Lead Implant Date 29518841    Implantable Lead Location Detail 1 APPENDAGE    Implantable Lead Special Function Diagnosis:  Sustained monomorphic VT    Implantable Lead Location [id number]    Implantable Lead Connection Status [id number]   Comp Met (CMET)     Status: Abnormal   Collection Time: 08/28/22  4:43 PM  Result Value Ref Range   Sodium 134 (L) 135 - 145 mEq/L   Potassium 4.9 3.5 - 5.1 mEq/L   Chloride 97 96 - 112 mEq/L   CO2 30 19 - 32 mEq/L   Glucose, Bld 115 (H) 70 - 99 mg/dL   BUN 13 6 - 23 mg/dL   Creatinine, Ser 5.36 0.40 - 1.50 mg/dL   Total Bilirubin 0.5 0.2 - 1.2 mg/dL   Alkaline Phosphatase 62 39 - 117 U/L   AST 24 0 - 37 U/L   ALT 17 0 - 53 U/L   Total Protein 6.9 6.0 - 8.3 g/dL   Albumin 3.6 3.5 - 5.2 g/dL   GFR 64.40 >34.74 mL/min    Comment: Calculated using the CKD-EPI Creatinine Equation (2021)   Calcium 9.2 8.4 - 10.5 mg/dL         has a past medical history of Adenomatous colon polyp (2009; 06/2012), AICD (automatic cardioverter/defibrillator) present, Atrial fibrillation (HCC), Bilateral sensorineural hearing loss, BPH (benign prostatic hypertrophy), CAD (coronary artery disease), CHF (congestive heart failure) (HCC), Chronic constipation, Chronic rhinitis, COPD (chronic obstructive pulmonary disease) (HCC), Diverticulosis of colon (2005), Gallstones (06/2014), GERD (gastroesophageal reflux disease), Hepatic steatosis (2010; 2016), Hyperlipidemia, Hypertension, Ischemic cardiomyopathy, Microscopic hematuria, Nephrolithiasis, Obstructive sleep apnea, Presence of permanent cardiac pacemaker, Squamous cell carcinoma of back, and Ventricular tachycardia (HCC).   reports that he  quit smoking about 42 years ago. His smoking use included cigarettes. He started smoking about 57 years ago. He has a 30 pack-year smoking history. He has never used smokeless tobacco.  Past Surgical History:  Procedure Laterality Date   ATRIAL ABLATION SURGERY     flutter ablation 2002   BREAST SURGERY Left 1990   "Tissue removed from breast"   CARDIAC CATHETERIZATION     CARDIOVERSION N/A 05/10/2020   Procedure: CARDIOVERSION;  Surgeon: Thurmon Fair, MD;  Location: [facility];  Service: Cardiovascular;  Laterality: N/A;   carotid doppler  09/25/12   No signif stenosis   COLONOSCOPY N/A 07/13/2012   Diverticula ascending colon.  Polyp x 1-recall 5 yrs (Dr. Ewing Schlein) Procedure: COLONOSCOPY;  Surgeon: Petra Kuba, MD;  Location: [facility];  Service: Endoscopy;  Laterality: N/A; polypectomy x 1    COLONOSCOPY WITH PROPOFOL N/A 08/18/2015   Procedure: COLONOSCOPY WITH PROPOFOL;  Surgeon: Napoleon Form, MD;  Location: [facility];  Service: Endoscopy;  Laterality: N/A;   ESOPHAGOGASTRODUODENOSCOPY N/A 07/13/2012   Procedure: ESOPHAGOGASTRODUODENOSCOPY (EGD);  Surgeon: Petra Kuba, MD;  Location: [facility];  Service: Endoscopy;  Laterality: N/A;  NORMAL   EXTRACORPOREAL SHOCK WAVE LITHOTRIPSY     HEMORRHOID BANDING     ICD     a) Guidant Contak H170- Sherryl Manges, MD 07/25/06 (second device) b) Medtronic CRT-D   ICD GENERATOR CHANGEOUT N/A 02/14/2020   Procedure: ICD GENERATOR CHANGEOUT;  Surgeon: Duke Salvia, MD;  Location: [facility] INVASIVE CV LAB;  Service: Cardiovascular;  Laterality: N/A;   INGUINAL HERNIA REPAIR Bilateral 1975   LEAD REVISION Bilateral 12/26/2011   Procedure: LEAD REVISION;  Surgeon: Marinus Maw, MD;  Location: [facility] CATH LAB;  Service: Cardiovascular;  Laterality: Bilateral;   PFTs  02/2015   Mod restriction (interstitial), mod diffusion defect (Dr. Maple Hudson)   precancer area keratsis removed  05/2015    leftf orehead and left arm and right arm and back     Allergies  Allergen Reactions   Sulfa Antibiotics Rash    Immunization History  Administered Date(s) Administered   Fluad Quad(high  Dose 65+) 09/23/2018, 10/05/2020, 11/08/2021, 09/30/2022   Influenza Split 11/11/2010, 11/02/2012, 10/14/2013   Influenza Whole 09/27/2011, 11/13/2011   Influenza, High Dose Seasonal PF 09/26/2015, 10/07/2016   Influenza, Seasonal, Injecte, Preservative Fre 09/23/2018   Influenza,inj,Quad PF,6+ Mos 11/03/2016, 09/20/2017   Influenza-Unspecified 09/26/2014, 10/15/2019, 11/11/2021   PFIZER Comirnaty(Gray Top)Covid-19 Tri-Sucrose Vaccine 04/24/2020   PFIZER(Purple Top)SARS-COV-2 Vaccination 02/02/2019, 02/22/2019, 10/26/2019, 09/29/2020, 11/14/2021   Pfizer(Comirnaty)Fall Seasonal Vaccine 12 years and older 09/30/2022   Pneumococcal Conjugate-13 06/15/2014   Pneumococcal Polysaccharide-23 03/15/2002, 07/10/2015   Respiratory Syncytial Virus Vaccine,Recomb Aduvanted(Arexvy) 11/14/2021   Td 08/27/2017   Tdap 04/04/2010   Zoster Recombinant(Shingrix) 03/29/2016   Zoster, Live 05/15/2015    Family History  Problem Relation Age of Onset   Heart disease Mother    Other Mother        colon surgery for fistula   Gallstones Mother    Breast cancer Maternal Grandmother    Coronary artery disease Father        family hx of   Heart attack Father        4 stents/ pacemaker   Nephrolithiasis Father    Hypertension Sister    Goiter Paternal Grandmother    Nephrolithiasis Son    Colon cancer Neg Hx      Current Outpatient Medications:    antiseptic oral rinse (BIOTENE) LIQD, 15 mLs by Mouth Rinse route in the morning and at bedtime., Disp: , Rfl:    BREZTRI AEROSPHERE 160-9-4.8 MCG/ACT AERO, INHALE 2 PUFFS INTO THE LUNGS IN THE MORNING AND AT BEDTIME, Disp: 10.7 g, Rfl: 6   Budeson-Glycopyrrol-Formoterol (BREZTRI AEROSPHERE) 160-9-4.8 MCG/ACT AERO, Inhale 2 puffs into the lungs in the morning and at bedtime., Disp: , Rfl:    carvedilol (COREG) 3.125  MG tablet, TAKE 1 TABLET(3.125 MG) BY MOUTH IN THE AM AND TAKE 2 TABLETS (3.125 MG) TOTAL OF 6.25 MG BY MOUTH IN THE PM WITH A MEAL., Disp: 180 tablet, Rfl: 3   cholecalciferol (VITAMIN D) 1000 units tablet, Take 1,000 Units by mouth every evening., Disp: , Rfl:    ciclopirox (PENLAC) 8 % solution, APPLY OVER NAIL AND SURROUNDING SKIN EVERY NIGHT AT BEDTIME. APPLY OVER PREVIOUSE COATS FOR 7 DAYS THEN REMOVE WITH ALCOHOL AND CONTINUE, Disp: 6.6 mL, Rfl: 0   clotrimazole (MYCELEX) 10 MG troche, Dissolve, swish and swallow one tablet by mouth 5 times daily for 14 days., Disp: 70 tablet, Rfl: 1   ENTRESTO 24-26 MG, TAKE 1 TABLET BY MOUTH TWICE DAILY, Disp: 180 tablet, Rfl: 3   eplerenone (INSPRA) 25 MG tablet, TAKE 1 TABLET(25 MG) BY MOUTH DAILY, Disp: 90 tablet, Rfl: 3   furosemide (LASIX) 40 MG tablet, Take 1 tablet (40 mg total) by mouth daily., Disp: 90 tablet, Rfl: 3   ipratropium (ATROVENT) 0.06 % nasal spray, Place 2 sprays into both nostrils 3 (three) times daily., Disp: 15 mL, Rfl: 1   JARDIANCE 10 MG TABS tablet, TAKE 1 TABLET(10 MG) BY MOUTH DAILY BEFORE BREAKFAST, Disp: 90 tablet, Rfl: 3   levalbuterol (XOPENEX HFA) 45 MCG/ACT inhaler, INHALE 2 PUFFS INTO THE LUNGS EVERY 4 HOURS AS NEEDED FOR WHEEZING, Disp: 15 g, Rfl: 6   loratadine (CLARITIN) 10 MG tablet, Take 10 mg by mouth in the morning., Disp: , Rfl:    Magnesium 250 MG TABS, Take 250 mg by mouth every evening., Disp: , Rfl:    mexiletine (MEXITIL) 150 MG capsule, TAKE 2 CAPSULES(300 MG) BY MOUTH TWICE DAILY, Disp: 360 capsule, Rfl: 2  Multiple Vitamin (MULTIVITAMIN WITH MINERALS) TABS, Take 1 tablet by mouth every evening., Disp: , Rfl:    nitroGLYCERIN (NITROSTAT) 0.4 MG SL tablet, DISSOLVE 1 TABLET UNDER THE TONGUE EVERY 5 MINUTES AS NEEDED FOR CHEST PAIN, Disp: 25 tablet, Rfl: 5   Olopatadine HCl (PATADAY OP), Apply 1 drop to eye daily., Disp: , Rfl:    Pirfenidone (ESBRIET) 267 MG TABS, Take 2 tablets (534 mg total) by mouth with  breakfast, with lunch, and with evening meal., Disp: 540 tablet, Rfl: 1   polyvinyl alcohol-povidone (HYPOTEARS) 1.4-0.6 % ophthalmic solution, Place 1-2 drops into both eyes in the morning and at bedtime., Disp: , Rfl:    potassium chloride SA (KLOR-CON M) 20 MEQ tablet, Take 1 tablet (20 mEq total) by mouth daily with lunch., Disp: 90 tablet, Rfl: 3   ranolazine (RANEXA) 500 MG 12 hr tablet, Take 1 tablet (500 mg total) by mouth 2 (two) times daily., Disp: 180 tablet, Rfl: 3   rivaroxaban (XARELTO) 20 MG TABS tablet, TAKE 1 TABLET(20 MG) BY MOUTH DAILY WITH SUPPER, Disp: 90 tablet, Rfl: 3   rosuvastatin (CRESTOR) 40 MG tablet, TAKE 1 TABLET(40 MG) BY MOUTH EVERY EVENING, Disp: 90 tablet, Rfl: 3   Spacer/Aero-Holding Chambers (AEROCHAMBER MV) inhaler, Use as instructed, Disp: 1 each, Rfl: 0   Tiotropium Bromide Monohydrate (SPIRIVA RESPIMAT) 1.25 MCG/ACT AERS, Inhale 2 puffs into the lungs daily., Disp: 4 g, Rfl: 5      Objective:   Vitals:   10/14/22 1313  BP: 98/70  Pulse: 65  Resp: 16  SpO2: 97%  Weight: 185 lb 12.8 oz (84.3 kg)  Height: 5\' 10"  (1.778 m)    Estimated body mass index is 26.66 kg/m as calculated from the following:   Height as of this encounter: 5\' 10"  (1.778 m).   Weight as of this encounter: 185 lb 12.8 oz (84.3 kg).  @WEIGHTCHANGE @  Filed Weights   10/14/22 1313  Weight: 185 lb 12.8 oz (84.3 kg)     Physical Exam   General: No distress. Looks well O2 at rest: no Cane present: no Sitting in wheel chair: no Frail: no Obese: no Neuro: Alert and Oriented x 3. GCS 15. Speech normal Psych: Pleasant Resp:  Barrel Chest - no.  Wheeze - no, Crackles - yes, No overt respiratory distress CVS: Normal heart sounds. Murmurs - no Ext: Stigmata of Connective Tissue Disease - no HEENT: Normal upper airway. PEERL +. No post nasal drip        Assessment:       ICD-10-CM   1. Dyspnea, unspecified type  R06.00 Hepatic function panel    B Nat Peptide     Pulse oximetry, overnight    2. IPF (idiopathic pulmonary fibrosis) (HCC)  J84.112     3. Pulmonary emphysema with fibrosis of lung (HCC)  J43.9    J84.10     4. Other fatigue  R53.83     5. Chronic systolic congestive heart failure (HCC)  I50.22     6. Encounter for medication counseling  Z71.89     7. DOE (dyspnea on exertion)  R06.09          Plan:     Patient Instructions  History V . Tach on remote monitoring - late June 2024 csCHF   Plan -ok to go back on breztri (doubt was causing V Tach - primary management per Dr Graciela Husbands - check bNP 10/14/2022   Pulmnary Fibrosis with emphysema ILD (interstitial lung disease) (HCC) IPF (  idiopathic pulmonary fibrosis) (HCC)  - IPF SLOWLY progressing but simple exercise hypoxemia test holding up - no lung nodule reported on CT in September 2024.  PLAN - check ONO on room air - -Currently on Esbriet 2 pills 3 times daily   -  being capped because baseline mexilitine can increase esbriet levels  - esbiret contributing to fatigue  -I will send a message to pharmacy that you are interested in continuing this especially because you have met 2024 catastrophic coverage - check LFT, BNP 10/14/2022 - check ONO on room air (see above) - spiro/dlco in  3 months  -Cancel spirometry scheduled for tomorrow 10/15/2022 - -We will see if you qualify for any clinical trials but cardiac issues might pose a problem    Chronic obstructive pulmonary disease, unspecified COPD type (HCC) Dyspnea on exertiona  - not in flare up - emphysema component is super mild and we reduced breztri tp spiriva last time but you ar reporting more symptoms esp shortness of reath   Plan  - STOP SPIRIVA - RESTART BREZTRIL 2 puff twice daily   - CMA to do script - albuterol as needed  Fatigue  - fatigue is due to esbriet but also due to heart issues esp pacer might not be responding to exertion  Plan  - monitor  GERD - appears omeprazole helping you bu  t you are worried about kidney side effect and in feb 2024 stopped omeprazole  Plan  - monitor off  omeprazole  Weight loss- drug induced Low appetite   -  currently stable  Plan  -continue healthy diet - might have to hold esbriet if weight loss continues < 170#   Followup - Return in about 12 weeks for 30 min visit, after Cleda Daub and DLCO, ILD, with Dr Marchelle Gearing, Face to Face Visit.   FOLLOWUP Return for 30 min visit, after Cleda Daub and DLCO, ILD, with Dr Marchelle Gearing, Face to Face Visit.  ( Level 05 visit E&M 2024: Estb >= 40 min   in  visit type: on-site physical face to visit  in total care time and counseling or/and coordination of care by this undersigned MD - Dr Kalman Shan. This includes one or more of the following on this same day 10/14/2022: pre-charting, chart review, note writing, documentation discussion of test results, diagnostic or treatment recommendations, prognosis, risks and benefits of management options, instructions, education, compliance or risk-factor reduction. It excludes time spent by the CMA or office staff in the care of the patient. Actual time 41 min)   SIGNATURE    Dr. Kalman Shan, M.D., F.C.C.P,  Pulmonary and Critical Care Medicine Staff Physician, [facility] Center Director - Interstitial Lung Disease  Program  Pulmonary Fibrosis [facility] at [facility] [place], [place], [address]  Pager: [phone], If no answer or between  15:00h - 7:00h: call [phone] Telephone: [phone]  1:58 PM 10/14/2022

## 2022-10-14 NOTE — Telephone Encounter (Signed)
Rx team  -= He showed a letter from [place] saying his treatment program ends August 30, 2023 but then he was also telling me that he was entering the catastrophic coverage.  His son said is not an issue.  Nevertheless he wants to see if he can get pirfenidone right now authorized because he is only maybe 60 days or 30 days left.  The paper from [facility] is on my desk and POD A

## 2022-10-14 NOTE — Patient Instructions (Addendum)
History V . Tach on remote monitoring - late June 2024 csCHF   Plan -ok to go back on breztri (doubt was causing V Tach - primary management per Dr Graciela Husbands - check bNP 10/14/2022   Pulmnary Fibrosis with emphysema ILD (interstitial lung disease) (HCC) IPF (idiopathic pulmonary fibrosis) (HCC)  - IPF SLOWLY progressing but simple exercise hypoxemia test holding up - no lung nodule reported on CT in September 2024.  PLAN - check ONO on room air - -Currently on Esbriet 2 pills 3 times daily   -  being capped because baseline mexilitine can increase esbriet levels  - esbiret contributing to fatigue  -I will send a message to pharmacy that you are interested in continuing this especially because you have met 2024 catastrophic coverage - check LFT, BNP 10/14/2022 - check ONO on room air (see above) - spiro/dlco in  3 months  -Cancel spirometry scheduled for tomorrow 10/15/2022 - -We will see if you qualify for any clinical trials but cardiac issues might pose a problem    Chronic obstructive pulmonary disease, unspecified COPD type (HCC) Dyspnea on exertiona  - not in flare up - emphysema component is super mild and we reduced breztri tp spiriva last time but you ar reporting more symptoms esp shortness of reath   Plan  - STOP SPIRIVA - RESTART BREZTRIL 2 puff twice daily   - CMA to do script - albuterol as needed  Fatigue  - fatigue is due to esbriet but also due to heart issues esp pacer might not be responding to exertion  Plan  - monitor  GERD - appears omeprazole helping you bu t you are worried about kidney side effect and in feb 2024 stopped omeprazole  Plan  - monitor off  omeprazole  Weight loss- drug induced Low appetite   -  currently stable  Plan  -continue healthy diet - might have to hold esbriet if weight loss continues < 170#   Followup - Return in about 12 weeks for 30 min visit, after Cleda Daub and DLCO, ILD, with Dr Marchelle Gearing, Face to Face  Visit.

## 2022-10-15 ENCOUNTER — Other Ambulatory Visit: Payer: Self-pay | Admitting: *Deleted

## 2022-10-15 DIAGNOSIS — Z5181 Encounter for therapeutic drug level monitoring: Secondary | ICD-10-CM

## 2022-10-15 DIAGNOSIS — R0600 Dyspnea, unspecified: Secondary | ICD-10-CM

## 2022-10-15 LAB — HEPATIC FUNCTION PANEL
ALT: 17 U/L (ref 0–53)
AST: 23 U/L (ref 0–37)
Albumin: 3.8 g/dL (ref 3.5–5.2)
Alkaline Phosphatase: 70 U/L (ref 39–117)
Bilirubin, Direct: 0.2 mg/dL (ref 0.0–0.3)
Total Bilirubin: 0.6 mg/dL (ref 0.2–1.2)
Total Protein: 7 g/dL (ref 6.0–8.3)

## 2022-10-15 LAB — BRAIN NATRIURETIC PEPTIDE: Pro B Natriuretic peptide (BNP): 311 pg/mL — ABNORMAL HIGH (ref 0.0–100.0)

## 2022-10-15 NOTE — Telephone Encounter (Signed)
Returned call to patient regarding [name] - he is able to continue receiving Esbriet through [facility] for free through 01/14/2023. He will need to call [facility] to set up shipment. Recommended he continue to utilize PAP through the end of 2024 and that pharmacy team will revisit cost for pirfenidone in 2025 as we are doing for other patients in the same situation  Left detailed VM with above information  Chesley Mires, PharmD, MPH, BCPS, CPP Clinical Pharmacist (Rheumatology and Pulmonology)

## 2022-10-16 ENCOUNTER — Other Ambulatory Visit: Payer: Self-pay | Admitting: Internal Medicine

## 2022-10-24 ENCOUNTER — Ambulatory Visit (INDEPENDENT_AMBULATORY_CARE_PROVIDER_SITE_OTHER): Payer: Medicare Other | Admitting: Family Medicine

## 2022-10-24 ENCOUNTER — Encounter: Payer: Self-pay | Admitting: Family Medicine

## 2022-10-24 VITALS — BP 112/62 | HR 66 | Temp 98.4°F | Resp 14 | Wt 183.2 lb

## 2022-10-24 DIAGNOSIS — R042 Hemoptysis: Secondary | ICD-10-CM | POA: Diagnosis not present

## 2022-10-24 MED ORDER — CEPHALEXIN 500 MG PO CAPS: 500.0000 mg | ORAL_CAPSULE | Freq: Three times a day (TID) | ORAL | 0 refills | Status: AC

## 2022-10-24 NOTE — Progress Notes (Signed)
   Subjective:    Patient ID: [name], male    DOB: January 17, 1940, 82 y.o.   MRN: [id number]  HPI Bloody sputum- pt reports he has hx of similar.  Last month was treated w/ Keflex by Dr Marchelle Gearing.  Pt reports the bloody sputum only occurs first thing in the morning.  Sxs started 6-7 days ago.  Pt denies SOB.  'i feel better than I have in 3 months'.  Pt is hoping to repeat course of Keflex.  Pt is on Xarelto.   Review of Systems For ROS see HPI     Objective:   Physical Exam Vitals reviewed.  Constitutional:      General: He is not in acute distress.    Appearance: Normal appearance. He is not ill-appearing.  HENT:     Head: Normocephalic and atraumatic.  Cardiovascular:     Rate and Rhythm: Normal rate. Rhythm irregular.  Pulmonary:     Effort: Pulmonary effort is normal. No respiratory distress.     Comments: Faint crackles at lung bases bilaterally No cough heard Skin:    General: Skin is warm and dry.  Neurological:     General: No focal deficit present.     Mental Status: He is alert and oriented to person, place, and time.  Psychiatric:        Mood and Affect: Mood normal.        Behavior: Behavior normal.        Thought Content: Thought content normal.           Assessment & Plan:  Bloody sputum- new.  Pt brought in tissues w/ blood tinged sputum that he has coughed up each morning for the last 6 days.  He reports cough clears after he first gets up and doesn't have any issues during the day.  States he feels the best he has in months.  Discussed that the bloody sputum may not be infectious at all and more so related to his underlying lung disease and the fact he's on Xarelto.  Will tx w/ Keflex as pt is anxious about infxn.  Discussed that if this doesn't clear his blood tinged sputum he needed to schedule w/ Pulmonary.  Pt expressed understanding and is in agreement w/ plan.

## 2022-10-24 NOTE — Patient Instructions (Signed)
Follow up as needed or as scheduled START the Keflex 3x/day x5 days IF no improvement in the sputum- call Dr Marchelle Gearing and see if he has any thoughts or recommendations Continue your inhalers per usual Call with any questions or concerns Stay Safe!  Stay Healthy! Happy Fall!!!

## 2022-10-30 ENCOUNTER — Other Ambulatory Visit: Payer: Self-pay | Admitting: Adult Health

## 2022-11-05 ENCOUNTER — Encounter: Payer: Self-pay | Admitting: Internal Medicine

## 2022-11-05 ENCOUNTER — Ambulatory Visit (INDEPENDENT_AMBULATORY_CARE_PROVIDER_SITE_OTHER): Payer: Medicare Other | Admitting: Internal Medicine

## 2022-11-05 ENCOUNTER — Ambulatory Visit: Payer: Medicare Other

## 2022-11-05 VITALS — BP 115/75 | HR 65 | Ht 70.0 in | Wt 185.4 lb

## 2022-11-05 DIAGNOSIS — J209 Acute bronchitis, unspecified: Secondary | ICD-10-CM

## 2022-11-05 DIAGNOSIS — I5022 Chronic systolic (congestive) heart failure: Secondary | ICD-10-CM

## 2022-11-05 DIAGNOSIS — I517 Cardiomegaly: Secondary | ICD-10-CM | POA: Diagnosis not present

## 2022-11-05 DIAGNOSIS — R0600 Dyspnea, unspecified: Secondary | ICD-10-CM

## 2022-11-05 DIAGNOSIS — R042 Hemoptysis: Secondary | ICD-10-CM

## 2022-11-05 DIAGNOSIS — J984 Other disorders of lung: Secondary | ICD-10-CM | POA: Diagnosis not present

## 2022-11-05 MED ORDER — DOXYCYCLINE HYCLATE 100 MG PO TABS: 100.0000 mg | ORAL_TABLET | Freq: Two times a day (BID) | ORAL | 0 refills | Status: DC

## 2022-11-05 MED ORDER — PREDNISONE 10 MG PO TABS: ORAL_TABLET | ORAL | 0 refills | Status: DC

## 2022-11-05 NOTE — Patient Instructions (Addendum)
ICD-10-CM   1. Cough with hemoptysis  R04.2     2. Acute bronchitis, unspecified organism  J20.9       Unclear reason why you are having hemoptysis which is coughing up of blood.  Xarelto was making it easier for you to cough up blood but it is not the primary reason.  The primary reason could be  -Worsening pulm fibrosis  -Acute bronchitis  -Perhaps a tumor but CT scan September 2024 did not show any mass   -Colonization with microorganisms  - could be heart failure  Plan  -Chest x-ray two-view today - Check blood BNP, CBC with differential -Check blood QuantiFERON gold  - Take doxycycline 100mg  po twice daily x 5 days; take after meals and avoid sunlight - Take prednisone 40 mg daily x 2 days, then 20mg  daily x 2 days, then 10mg  daily x 2 days, then 5mg  daily x 2 days and stop  -If coughing up of blood gets worse then go to the ER  -Might have to consider bronchoscopy if problems record/persist  Follow-up -Call us in 1 to 2 weeks with response - 6 weeks to spirometry and DLCO -Return to see Dr. Marchelle Gearing in 6 weeks but after breathing test

## 2022-11-05 NOTE — Progress Notes (Addendum)
OV 10/05/2020 -transfer of care to the ILD center with Dr. Marchelle Gearing.  Patient being referred by Dr. Levy Pupa  Subjective:  Patient ID: [name], male , DOB: 1940-11-17 , age 82 y.o. , MRN: [id number] , ADDRESS: [address] [place] [address] PCP Sheliah Hatch, MD Patient Care Team: Sheliah Hatch, MD as PCP - General (Family Medicine) Jerilee Field, MD as Consulting Physician (Urology) Karie Soda, MD as Consulting Physician (General Surgery) Jens Som Madolyn Frieze, MD as Consulting Physician (Cardiology) Flo Shanks, MD as Consulting Physician (Otolaryngology) Duke Salvia, MD as Consulting Physician (Cardiology) Maris Berger, MD as Consulting Physician (Ophthalmology) Delton Coombes Les Pou, MD as Consulting Physician (Pulmonary Disease) Napoleon Form, MD as Consulting Physician (Gastroenterology) Dahlia Byes, [facility] as Pharmacist (Pharmacist)  This Provider for this visit: Treatment Team:  Attending Provider: Kalman Shan, MD    10/05/2020 -   Chief Complaint  Patient presents with   Follow-up    Pt states that he does have complaints of SOB that can happen at any time but is worse with exertion.     HPI [name] 82 y.o. -referred by Dr. Levy Pupa.  History is provided by the patient and also review of the medical records.  Patient tells me he is aware that he has ILD.  He has a significant symptom burden.  He says he is recently been told his ILD is gotten worse.  He was on amiodarone for many years.  Then approximately 18 months ago he stopped it because he got worried about the drug.  Dr. Delton Coombes notes indicate that patient has combined emphysema and interstitial lung disease.  His pulmonary function test profile shows 20% decline in FVC in the last 5 years or so with most of it coming in the last 2 years.  High-resolution CT chest only shows some decline.  High-resolution CT chest personally visualized by me and is  probable UIP.  His serologies are negative.  He is a former smoker having quit over 40 years ago.  He is Caucasian and his age is greater than 80.  Male gender.  He definitely has progressive symptoms.  He really wants a good symptom control.  He also wants understand his disease.  Today most of the focus was spent on helping him understand interstitial lung disease.  I explicitly told him that I would not be able to address symptom burden today.  He will also switch his comprehensive pulmonary care to see Dr. Marchelle Gearing myself.  He has had an MI in the past.  His atrial fibrillation is on Eliquis.  He is no longer on amiodarone.  His walking desaturation test did not show desaturation more than 3 points.   CT Chest data Sept 2022  Narrative & Impression  CLINICAL DATA:  82 year old male with history of COPD. Evaluate for interstitial lung disease.   EXAM: CT CHEST WITHOUT CONTRAST   TECHNIQUE: Multidetector CT imaging of the chest was performed following the standard protocol without intravenous contrast. High resolution imaging of the lungs, as well as inspiratory and expiratory imaging, was performed.   COMPARISON:  Chest CT 02/17/2018.   FINDINGS: Cardiovascular: Heart size is normal. There is no significant pericardial fluid, thickening or pericardial calcification. There is aortic atherosclerosis, as well as atherosclerosis of the great vessels of the mediastinum and the coronary arteries, including calcified atherosclerotic plaque in the left main, left anterior descending, left circumflex and right coronary arteries. Calcifications  of the aortic valve. Curvilinear calcifications associated with the myocardium of the inferior wall of the left ventricle in the mid ventricle and base, likely sequela of prior left circumflex coronary artery territory myocardial infarction(s). Left-sided biventricular pacemaker/AICD with lead tips terminating in the right atrial appendage, right  ventricular apex, and overlying the lateral wall the left ventricle via the coronary sinus and coronary veins.   Mediastinum/Nodes: No pathologically enlarged mediastinal or hilar lymph nodes. Please note that accurate exclusion of hilar adenopathy is limited on noncontrast CT scans. Esophagus is unremarkable in appearance. No axillary lymphadenopathy.   Lungs/Pleura: High-resolution images again demonstrate some patchy areas of ground-glass attenuation, septal thickening, subpleural reticulation, mild traction bronchiectasis and peripheral bronchiolectasis in the lungs bilaterally. No definite honeycombing. These findings have a mild craniocaudal gradient. Inspiratory and expiratory imaging is unremarkable. No acute consolidative airspace disease. No pleural effusions. Chronic elevation of the right hemidiaphragm is again noted. No definite suspicious appearing pulmonary nodules or masses are noted.   Upper Abdomen: Low-attenuation lesions are noted in the left lobe of the liver, incompletely characterized on today's non-contrast CT examination, but similar to the prior study and measuring up to 1.3 cm in diameter in segment 2, statistically likely to represent cysts.   Musculoskeletal: There are no aggressive appearing lytic or blastic lesions noted in the visualized portions of the skeleton.   IMPRESSION: 1. The appearance of the lungs is compatible with interstitial lung disease, categorized as probable usual interstitial pneumonia (UIP) per current ATS guidelines. Findings appear mildly progressive compared to the prior study. 2. Aortic atherosclerosis, in addition to left main and 3 vessel coronary artery disease. Assessment for potential risk factor modification, dietary therapy or pharmacologic therapy may be warranted, if clinically indicated. 3. There are calcifications of the aortic valve. Echocardiographic correlation for evaluation of potential valvular dysfunction  may be warranted if clinically indicated.   Aortic Atherosclerosis (ICD10-I70.0).     Electronically Signed   By: Trudie Reed M.D.   On: 09/22/2020 11:28   No results found   Results for PRYCE, FLY (MRN [id number]) as of 10/05/2020 11:51  Ref. Range 11/14/2015 11:52 09/27/2020 12:19 09/27/2020 13:46  Anti Nuclear Antibody (ANA) Latest Ref Range: NEGATIVE  NEG  NEGATIVE  Anti JO-1 Latest Ref Range: 0.0 - 0.9 AI <0.2    Cyclic Citrullin Peptide Ab Latest Units: UNITS <16  <16  ds DNA Ab Latest Units: IU/mL 1    RA Latex Turbid. Latest Ref Range: <14 IU/mL <14 <14 <14  ENA SM Ab Ser-aCnc Latest Ref Range: <1.0 NEG AI  <1.0 NEG    ANA,IFA RA DIAG PNL W/RFLX TIT/PATN Unknown   Rpt  Ribonucleic Protein(ENA) Antibody, IgG Latest Ref Range: <1.0 NEG AI  <1.0 NEG    SSA (Ro) (ENA) Antibody, IgG Latest Ref Range: <1.0 NEG AI <1.0 NEG <1.0 NEG   SSB (La) (ENA) Antibody, IgG Latest Ref Range: <1.0 NEG AI <1.0 NEG <1.0 NEG   Scleroderma (Scl-70) (ENA) Antibody, IgG Latest Ref Range: <1.0 NEG AI  <1.0 NEG      OV 11/08/2020  Subjective:  Patient ID: [name], male , DOB: Aug 17, 1940 , age 68 y.o. , MRN: [id number] , ADDRESS: [address] [place] [address] PCP Sheliah Hatch, MD Patient Care Team: Sheliah Hatch, MD as PCP - General (Family Medicine) Jerilee Field, MD as Consulting Physician (Urology) Karie Soda, MD as Consulting Physician (General Surgery) Jens Som, Madolyn Frieze, MD as Consulting Physician (Cardiology) [place],  Zola Button, MD as Consulting Physician (Otolaryngology) Duke Salvia, MD as Consulting Physician (Cardiology) Maris Berger, MD as Consulting Physician (Ophthalmology) Delton Coombes Les Pou, MD as Consulting Physician (Pulmonary Disease) Napoleon Form, MD as Consulting Physician (Gastroenterology) Dahlia Byes, [facility] as Pharmacist (Pharmacist)  This Provider for this visit: Treatment Team:  Attending Provider: Kalman Shan, MD    11/08/2020 -   Chief Complaint  Patient presents with   Follow-up    Pt states that he has been worried about taking the Esbriet and states that he has not started taking it yet. States his breathing is about the same.     HPI ILO TARTAGLIONE 82 y.o. - here with wife to discuss disease, Wife is 59 years old. Her name is Myrnal She was a clinical trials coordinator in the late 1990s at [employer] and was involved in oncology and sildenafil trials. Lot of questions on type of fibrosis (IPF), natural course (few to several  yearS), quality of life expectations, anti fibrotics . Wants symptom relief - referred to rehab. HE is interested in support group - gave him contact number. Risk factor - prior smoking and amio. He has not started esbriet yet. Will do on 11/14/20 . This is becase he and wife went to  reunion. Ate fried food and got diarrhea. Now resolved.   [facility] Comprehensive ILD Questionnaire        Past Medical History :  Also copd, cad, chf nos, osa - not using cPAP. Hx of pna > 10 years ago. Hx of renal sones + Has had 5 mRNA covid shots. Last Sept 2022. Never had covid Chronic systolic chf - ef 30-35% in June 2021 (20% in 2017) Patulous esophagus GERD Hx of amio 15--20 years. STppped 2019 Elevated R diapharagm  ROS:  Fatigue + Dry eyes + Gerd +  FAMILY HISTORY of LUNG DISEASE:  denes  PERSONAL EXPOSURE HISTORY:  Smoker [phone]. NOt smoked in 49  years  HOME  EXPOSURE and HOBBY DETAILS :  [place] yers[place]. [place] in [place]. 2 of his tile floors have mildew. Not used jacuzzi in 10 years. 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  Patient has underlying COPD and IPF.  CT chest in September 22, 2020 showed probable UIP.  And mildly progressive compared to previous study.  Patient was recommended to start on Esbriet.  Patient says he has started Esbriet and seems to be tolerating okay.  He has had a couple episodes where he has some loose stools and he took Imodium.  Unfortunately did have some constipation so he also took MiraLAX which caused him to have upset stomach.  We discussed dietary recommendations and the use of Imodium.  Patient says appetite is good.  No nausea vomiting.  Patient is on Mexiletine , his Esbriet dose will need to remain at 534 mg 3 times a day.  Patient is a been on a tapering dose and has reached his maximum maintenance dose. Patient has shortness of breath with activities.  Remains active.  Gets winded with heavy lifting or prolonged walking.  Has a daily minimally productive cough.   12/06/2020 Follow up ;  IPF, COPD  Patient returns for a 2-week follow-up.  Patient has underlying COPD.  Is on Breztri inhaler.  Patient says overall breathing is doing okay.  Feels that it has slightly improved with decreased cough.  Patient also has underlying IPF.  Recently started on Esbriet.  He is on a lower dose due to drug interactions.  Currently on 534 mg 3 times a day.  Last visit patient was having some increased stomach issues.  We discussed some dietary changes.  Patient is now eating less fried foods and higher protein.  Patient says this is made a huge difference and stomach is much better. Hepatic function panel on November 16, 2020 showed normal LFTs.   TEST/EVENTS :  09/22/2020 High-resolution CT chest compatible with interstitial lung disease probable UIP, mildly progressive compared to previous stud      OV 12/22/2020  Subjective:  Patient ID: [name], male , DOB: October 22, 1940 , age 79 y.o. , MRN: [id number] , ADDRESS: [address] [place] [address] PCP Sheliah Hatch, MD Patient Care Team: Sheliah Hatch, MD as PCP - General (Family Medicine) Jerilee Field, MD as Consulting Physician (Urology) Karie Soda, MD as Consulting Physician (General Surgery) Jens Som, Madolyn Frieze, MD as Consulting Physician (Cardiology) Flo Shanks, MD as Consulting Physician (Otolaryngology) Duke Salvia, MD as Consulting Physician (Cardiology) Maris Berger, MD as Consulting Physician (Ophthalmology) Delton Coombes Les Pou, MD as Consulting Physician (Pulmonary Disease) Napoleon Form, MD as Consulting Physician (Gastroenterology) Dahlia Byes, [facility] as Pharmacist (Pharmacist)  This Provider for this visit: Treatment Team:  Attending Provider: Kalman Shan, MD   IPF dx given Sept 2022 -> started esbriet  12/22/2020 -   Chief Complaint  Patient presents with   Follow-up     HPI JARIC BENETTI 82 y.o. presents with his wife.-since giving him the diagnosis of IPF and recommending pirfenidone over nintedanib [he has heart disease] he see nurse practitioner 2x1 for a face-to-face visit not a video visit.  He is on 2 pills 3 times a day of pirfenidone.  This is because he is on mexiletine [antiarrhythmic that can also cause abdominal pain, headache and nausea] because of this and CYPa12 mechanism that could inhibit pirfenidone metabolism resulting in elevated levels of pirfenidone he has been kept on 2 pills 3 times daily.  He is upset that this was not picked up by the pulmonary pharmacist but only by the cardiology pharmacist.  He reports because of this he is lost a little bit of confidence in [place].  Overall though he says that he has chronic alternating diarrhea with constipation.  He takes Imodium for the diarrhea and then this resolves and constipation.  Then he has to take MiraLAX.  This is at his baseline despite being on Esbriet it is not worse.  He says he is just frustrated by this alternating constipation and diarrhea.  Between diarrhea and constipation  he does not favor constipation.  The only side effect he is noticing from Esbriet is that he is more fatigued.  Otherwise he is overall stable as seen by the symptom score below other than fatigue.  He is also worried because he is on submaximal doses of pirfenidone that his disease could progress.  I did explain to him that even at 2 pills 3 times daily it is beneficial and with inhibitor effects of mexiletine levels are slightly higher and could potentially reflect similar to being on 3 pills 3 times daily.  Did explain  to him that he might not be able to tolerate 3 pills 3 times daily.  Given his cardiac issues nintedanib currently as second line.  We had a shared understanding and agreement on this approach.  No results found.       OV 01/19/2021  Subjective:  Patient ID: [name], male , DOB: 1940-10-28 , age 30 y.o. , MRN: [id number] , ADDRESS: [address] [place] [address] PCP Sheliah Hatch, MD Patient Care Team: Sheliah Hatch, MD as PCP - General (Family Medicine) Jerilee Field, MD as Consulting Physician (Urology) Karie Soda, MD as Consulting Physician (General Surgery) Jens Som Madolyn Frieze, MD as Consulting Physician (Cardiology) Flo Shanks, MD as Consulting Physician (Otolaryngology) Duke Salvia, MD as Consulting Physician (Cardiology) Maris Berger, MD as Consulting Physician (Ophthalmology) Delton Coombes Les Pou, MD as Consulting Physician (Pulmonary Disease) Napoleon Form, MD as Consulting Physician (Gastroenterology) Dahlia Byes, [facility] as Pharmacist (Pharmacist)  This Provider for this visit: Treatment Team:  Attending Provider: Kalman Shan, MD    01/19/2021 -   Chief Complaint  Patient presents with   Follow-up    PFT performed today.  Pt states that he noticed with last bloodwork that his liver enzymes were elevated and also states that he has noticed some yellowing of skin.  IPF dx given Sept 2022 -> started  esbriet   HPI AUNDRAE BATOR 82 y.o. -returns for follow-up.  This visit is to see his pulmonary function test and his uptake with pirfenidone.  His pulmonary function test shows stability/slight improvement from September 2022 and similar to his previous baseline.  In terms of pirfenidone intake he continues to have alternating diarrhea and constipation.  In talking to him in more detail he says before his Esbriet he used to take MiraLAX once every other day and have a bowel movement [hard stool] every day once.  After this starting as.  He is now taking his MiraLAX only 1/week.  1 day he has diarrhea in 4 days he is got constipation without any bowel movement.  Therefore he is not taking his MiraLAX for his previous schedule which we discussed at the last visit.  In addition he is lost 1 pound of weight.  He is also reporting intermittent gaseous symptoms bloating acid reflux sour taste.  He takes Tums for this.  He is not taking anything for acid reflux such as PPI or H2 blockade.  He is just taking Tums.  We did discuss the idea of taking ginger capsules but he drinks ginger ale 1 bottle every day.  Did indicate to him even in previous visit that sugary drinks can cause diarrhea and potentially a carbonated drink can contribute to acid reflux.  He is agreed to stop this.  I showed him an example of a ginger capsule that he could buy at [facility] or convenience store.  Of note his Esbriet intake is 2 pills 3 times daily due to drug interaction with mexiletine. For associated emphysema is on inhaler therapy.  Of note: His AST on 01/01/2021 was 44.  Other lab calls is abnormal even though it is below 45.  He was informed of this results.  The plan is to retest today.  He does tell me that he and his wife noticed that 1 day a few weeks ago that his left finger was yellow and after that it turned out to be normal after a few days.  Due to concerns of potential jaundice and worsening LFTs  Arava and the ongoing  GI side effects have asked him to stop his Esbriet till further notice or 1 week    OV 03/09/2021  Subjective:  Patient ID: [name], male , DOB: 11/15/1940 , age 81 y.o. , MRN: [id number] , ADDRESS: [address] [place] [address] PCP Sheliah Hatch, MD Patient Care Team: Sheliah Hatch, MD as PCP - General (Family Medicine) Jerilee Field, MD as Consulting Physician (Urology) Karie Soda, MD as Consulting Physician (General Surgery) Jens Som Madolyn Frieze, MD as Consulting Physician (Cardiology) Flo Shanks, MD as Consulting Physician (Otolaryngology) Duke Salvia, MD as Consulting Physician (Cardiology) Maris Berger, MD as Consulting Physician (Ophthalmology) Delton Coombes Les Pou, MD as Consulting Physician (Pulmonary Disease) Napoleon Form, MD as Consulting Physician (Gastroenterology) Erroll Luna, [facility] (Pharmacist)  This Provider for this visit: Treatment Team:  Attending Provider: Kalman Shan, MD    03/09/2021 -   Chief Complaint  Patient presents with   Follow-up    Pt states he is about the same since last visit. States he recently began pulmonary rehab.   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 -returns for his IPF follow-up.  He has further weight loss.  He is now down to 199 pounds although he is still overweight.  His BMI is over 28.  He is confident that his weight loss is because of medications.  It is otherwise drug-induced weight loss.  He believes the pirfenidone has reduced his appetite.  In addition Jardiance is also causing weight loss.  At this point in time he is happy with his weight loss because he is still overweight.  Overall he is tolerating his pirfenidone well.  He was having alternating diarrhea and constipation but after taking my  suggestion and trialing out different regimens he is now settled on taking omeprazole along with ginger and MiraLAX in a scheduled way.  This is now helped prevent the alternating diarrhea and constipation.  He is quite happy with this.  He takes his pirfenidone at 2 pills 3 times daily and 5-6 hours apart.  It does interfere with the social schedule.  Sometimes family wants to have have dinner or lunch at different times.  He is asking for some flexibility.  I encouraged him to try taking it slightly out of schedule and also with different types of snacks and experiment to see whether he has any side effects.  I did indicate to him ultimately the side effects of what will determine his tolerance.  He tells me that the omeprazole is doing quite well to control the GERD.  However the over-the-counter blister pack says it is only for 14 days.  He took clarification on this.  We discussed the long-term effects of omeprazole but overall taking balance versus risk I recommended that he continue his omeprazole for now.  In terms of his symptoms he stable.  He is attending pulmonary rehabilitation.  He says it is helping him.  His wife used to be a clinical trials coordinator.  He is interested in clinical trials.  However at this point in time he says his son is requested that he take a second opinion at [facility].  He is seeing Dr. Marcelene Butte on 03/14/2021.  I did indicate to him that we are all part of  the pulmonary fibrosis foundation network.  I did indicate to him that Dr. Jon Billings is a well-known expert.  I have also indicated to him that I support the second opinion and I have personally emailed Dr. Jon Billings.  I have asked the patient to take CD-ROM of the CT scan to [facility].  Recent lab work 7 days ago shows normal creatinine and normal liver function test.  [facility] Dr Wetzel Bjornstad 03/14/21  Mr. Drennon is a very pleasant 82 y.o.-year-old male with no significant prior pulmonary problems as a child or  younger adult. He also has history of CAD, ICM/chronic systolic/diastolic HF s/p ICD, Afib on mexiletine, HTN, HLD. In 2021, he first noticed worsening shortness of breath on exertion but also attributed to COPD and Afib. Think it has been gradually worsening over the past 6 months but denies any recent illnesses. He was initially diagnosed based on imaging showing probable UIP during management of COPD. Followed by Dr. Mickel Crow. Never treated with steroids. Negative autoimmune panel in 2017 and 09/2020. He was previously on amiodarone for 15-20 years and stopped in 2019 due to concerns for ILD. Started on Esbriet in Sept 2022. Initially had a lot of diarrhea and constipation, and now more tolerable with less GI issues since starting omeprazole and ginger root and miralax 2-3x /week. Wears sunscreen and UV protective clothing.   He reports breathing is ok. Biggest problem is dry mouth and cough productive light yellow/green sputum chronic for the past 4 years attributed to COPD and chronic sinus drainage. Takes Breztri, albuterol, ipratropium nasal sprays which helps. Able to clear sputum. Started pulmonary rehab about 3 weeks ago in mid Feb 2023 which has helped a lot. Doing it twice a week, first 15 minutes walking, 2nd 15 minutes with stationary elliptical, then stretching but has to rest 2-3 hours at home. No fever/chills/night sweats. Lost weight 211 to 193lb since starting Esbriet.  His current exercise capacity includes walking 15 minute every morning [place]   Mr. Strawder is a very pleasant 82 y.o.-year-old male with no significant prior pulmonary problems as a child or younger adult. He also has history of CAD, ICM/chronic systolic/diastolic HF s/p ICD, Afib on mexiletine, HTN, HLD. In 2021, he first noticed worsening shortness of breath on exertion but also attributed to COPD and Afib. Think it has been gradually worsening over the past 6 months but denies any  recent illnesses. He was initially diagnosed based on imaging showing probable UIP during management of COPD. Followed by Dr. Mickel Crow. Never treated with steroids. Negative autoimmune panel in 2017 and 09/2020. He was previously on amiodarone for 15-20 years and stopped in 2019 due to concerns for ILD. Started on Esbriet in Sept 2022. Initially had a lot of diarrhea and constipation, and now more tolerable with less GI issues since starting omeprazole and ginger root and miralax 2-3x /week. Wears sunscreen and UV protective clothing.   He reports breathing is ok. Biggest problem is dry mouth and cough productive light yellow/green sputum chronic for the past 4 years attributed to COPD and chronic sinus drainage. Takes Breztri, albuterol, ipratropium nasal sprays which helps. Able to clear sputum. Started pulmonary rehab about 3 weeks ago in mid Feb 2023 which has helped a lot. Doing it twice a week, first 15 minutes walking, 2nd 15 minutes with stationary elliptical, then stretching but has to rest 2-3 hours at home. No fever/chills/night sweats. Lost weight 211 to 193lb since starting Esbriet.  His current exercise capacity includes walking 15 minute every morning [place]    Mr. Covin is a 82 y.o. 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 He has overall been stable to very slightly progressed on imaging and PFTs for more than 6-8 years which is atypical for IPF. [place] panel. Negative autoimmune labs. He has been appropriately managed with Esbriet, dose reduced due to interactions with mexiletine, as well as pulmonary rehab. He is also being closely followed by cardiology for systolic HF with last echo in 1610 showing no RV dysfunction. Recommend to continue current dose of Esbriet and encouraged  to stay active while at home.  He also has a large esophagus on imaging and he reports severe acid reflux prior to starting Esbriet that is now better controlled on Esbriet. Aspiration and reflux unlikely to be primary cause of fibrosis but may be exacerbating his disease in predominantly basilar distribution so educated patient on aspiration precautions including raising HOB at least 30 degrees and not eating meals at least 2-3 hours before bedtime. He will try to nap in his recliner with head raised instead of lying flat in bed.   OV 05/07/2021  Subjective:  Patient ID: [name], male , DOB: Jul 19, 1940 , age 79 y.o. , MRN: [id number] , ADDRESS: [address] [place] [address] PCP Sheliah Hatch, MD Patient Care Team: Sheliah Hatch, MD as PCP - General (Family Medicine) Jerilee Field, MD as Consulting Physician (Urology) Karie Soda, MD as Consulting Physician (General Surgery) Jens Som Madolyn Frieze, MD as Consulting Physician (Cardiology) Flo Shanks, MD as Consulting Physician (Otolaryngology) Duke Salvia, MD as Consulting Physician (Cardiology) Maris Berger, MD as Consulting Physician (Ophthalmology) Delton Coombes Les Pou, MD as Consulting Physician (Pulmonary Disease) Napoleon Form, MD as Consulting Physician (Gastroenterology) Erroll Luna, [facility] (Pharmacist) Yetta Glassman, RN as Case Manager  This Provider for this visit: Treatment Team:  Attending Provider: Kalman Shan, MD    05/07/2021 -   Chief Complaint  Patient presents with   Follow-up    Pt states he has been doing well since last visit and denies any complaints.    HPI JAFETH MCGARY 82 y.o. -returns for follow-up.  After last visit he did see Dr. Marcelene Butte at [facility].  I reviewed the notes.  Really appreciate Dr. Jon Billings adding an extra perspective.  His notes are captured above.  Patient continues on pirfenidone at 2 pills 3 times daily in the  setting of mexiletine.  He has completed rehabilitation.  Has had 5 COVID boosters.  He asked me about taking more booster.  I recommended to hold off but again emphasized his risk benefit ratio.  Did express that if he got COVID he could take antiviral.  We should wait till a new vaccine for the new strains comes up.  At this point in time the vaccine is for the old strains.  He feels overall stable after taking pirfenidone and also pulmonary rehabilitation.  He says this is the best he is felt.  He continues with his alternating diarrhea and constipation but it is actually improved.  Because of this he does not want to do any clinical trial that might have GI side effects.  He is interested in clinical trials but at  this point in time he wants to hold off.  He got a letter from Dr. Lavon Paganini about undergoing colonoscopy.  I did approve this.  He continues to lose weight he says this is unintentional and account of pirfenidone and his diabetic drug.  Currently the level of weight loss is healthy.  Of note he has chronic systolic heart failure.  Last echocardiogram was in 2021.  He is going to see Dr. Jens Som in June 2023.  I did recommend getting an echocardiogram before he saw Dr. Jens Som and I will order this I had of Dr. Ludwig Clarks visit.  He is okay with this plan.  He wants to have this done . HAve this done in [place] area    CT Chest data  No results found.    PFT  OV 08/03/2021  Subjective:  Patient ID: [name], male , DOB: 30-May-1940 , age 65 y.o. , MRN: [id number] , ADDRESS: [address] [place] [address] PCP Sheliah Hatch, MD Patient Care Team: Sheliah Hatch, MD as PCP - General (Family Medicine) Jerilee Field, MD as Consulting Physician (Urology) Karie Soda, MD as Consulting Physician (General Surgery) Jens Som Madolyn Frieze, MD as Consulting Physician (Cardiology) Flo Shanks, MD as Consulting Physician (Otolaryngology) Duke Salvia, MD  as Consulting Physician (Cardiology) Maris Berger, MD as Consulting Physician (Ophthalmology) Delton Coombes Les Pou, MD as Consulting Physician (Pulmonary Disease) Napoleon Form, MD as Consulting Physician (Gastroenterology) Erroll Luna, [facility] (Pharmacist)  This Provider for this visit: Treatment Team:  Attending Provider: Kalman Shan, MD    08/03/2021 -   Chief Complaint  Patient presents with   Follow-up    Follow-up IPF cough, SOB, Wheezing     HPI [name] 82 y.o. -+ returns for follow-up.  Since his last visit he continues to be stable from a dyspnea standpoint.  Is undergoing pulmonary rehabilitation.  On the pulmonary function test is a little bit waxing and waning but I would say the overall trend in the last 1 year appears stable although definitely progressive in the last 3 years.  It appears that since starting pirfenidone he is likely stable.  He is on the lower dose pirfenidone.  The main issue is that he is struggling with the pirfenidone.  His fatigue is a little more than usual.  He is also having alternating constipation and diarrhea.  However he is able to be stable on this.  Also he says that since attending rehabilitation his got some right infrascapular infra axillary muscle pull symptoms.  He is wondering if it is because of rehabilitation.  His CT scan late last year did not show any abnormalities consistent with a mass.  He has had weight loss because of pirfenidone and also Jardiance.  Although he feels this is stabilized.  He feels it is in a happy point.  He had an echocardiogram and his ejection fraction is 35%.   PFT   OV 11/08/2021  Subjective:  Patient ID: [name], male , DOB: 01-Jun-1940 , age 78 y.o. , MRN: [id number] , ADDRESS: [address] [place] [address] PCP Sheliah Hatch, MD Patient Care Team: Sheliah Hatch, MD as PCP - General (Family Medicine) Jerilee Field, MD as Consulting Physician  (Urology) Karie Soda, MD as Consulting Physician (General Surgery) Jens Som Madolyn Frieze, MD as Consulting Physician (Cardiology) Flo Shanks, MD as Consulting Physician (Otolaryngology) Duke Salvia, MD as Consulting Physician (Cardiology) Maris Berger, MD as Consulting Physician (  Ophthalmology) Leslye Peer, MD as Consulting Physician (Pulmonary Disease) Napoleon Form, MD as Consulting Physician (Gastroenterology) Erroll Luna, [facility] (Pharmacist)  This Provider for this visit: Treatment Team:  Attending Provider: Kalman Shan, MD   11/08/2021 -   Chief Complaint  Patient presents with   Follow-up    Follow-up for CT PT states no changes with breathing     HPI [name] 82 y.o. -returns for follow-up.  Presents with his wife.  Wife is returning after a long time.  She is to work in clinical trials.  He is feeling stable although he is continue to lose weight.  He feels the low-dose of Esbriet is being well-tolerated except for significant fatigue which she attributes to pirfenidone.  He is continue to lose weight which is a combination of Jardiance for his heart failure and also his pirfenidone.  He still technically overweight with a current weight with a BMI of 26 but he is getting close to ideal body weight.  He is aware that if he starts going below ideal body weight then we will have to make decisions about stopping pirfenidone or Jardiance.  Last visit there was some confusion but this pulmonary function test was worsening so we got a high-resolution CT chest and this shows stability for the last 1 year suggesting beneficial effects of pirfenidone/Esbriet.  However he does have progressive disease over the course of few years.  His GI symptoms are more stable at this point.  Even though he has a lot of fatigue he is willing to continue with pirfenidone  He is interested in clinical trials as a care option.  His wife worked in clinical trials he  understands this experimental nature of clinical trials with drug development he understands the placebo concept.  We discussed the fact we have 5 trials going on.  I do not recommend 2 which had significant GI side effects.  He does not qualify for cough study because he is on carvedilol and this drug interactions.  He is aware that studies of strict inclusion exclusion criteria and his medical problems from his heart or EKG or drug interaction issues could preclude him.  With that in mind we discussed the potential of the following studies    Inhaled treprostinil: Preliminary study shows beneficial effects and IPF with lung function improvement.  This medicine is available in the market for many years for pulmonary hypertension.  He will have to take it as a nebulizer 4 times daily and each time 9-12 times.  The study visits are not too cumbersome.  However he finds this inconvenient from a medication administration standpoint  Subcutaneous injection monoclonal antibody Vixarelimab: Every 2 weeks.  Evidence is only on prurigo appears 1 yesterday with open label extension.  Injections every 2 weeks the significant number of visits.  We discussed side effect profile on this based on established literature.  He is open to this idea but wants a second choice.  50% chance of placebo   Oral tablet Bexotegrast - Pliant study.  33% chance of placebo.  Well-tolerated in the early phase to 11-month study.  Minimal GI side effects.  Initial improvement over 4 weeks and then some tailing off on preliminary data but still significant improvement compared to placebo.  He is interested in this.  The study is not activated at our location. I gave him publicly available information of the above studies to him and his wife.    OV 02/21/2022  Subjective:  Patient ID: [name], male , DOB: Apr 10, 1940 , age 61 y.o. , MRN: [id number] , ADDRESS: [address] [place] [address] PCP Sheliah Hatch,  MD Patient Care Team: Sheliah Hatch, MD as PCP - General (Family Medicine) Jerilee Field, MD as Consulting Physician (Urology) Karie Soda, MD as Consulting Physician (General Surgery) Jens Som Madolyn Frieze, MD as Consulting Physician (Cardiology) Flo Shanks, MD as Consulting Physician (Otolaryngology) Duke Salvia, MD as Consulting Physician (Cardiology) Maris Berger, MD as Consulting Physician (Ophthalmology) Delton Coombes Les Pou, MD as Consulting Physician (Pulmonary Disease) Napoleon Form, MD as Consulting Physician (Gastroenterology) Erroll Luna, [facility] (Pharmacist)  This Provider for this visit: Treatment Team:  Attending Provider: Kalman Shan, MD    02/21/2022 -   Chief Complaint  Patient presents with   Follow-up    Review PFT today.  Cough persistent.  Only able to do minimal exercises due to fatigue and SOB.  Patient would like to have a spacer device to use with Breztri   %  HPI JAYLINN CURBY 82 y.o. -reports for followup.  Overall feels stable but reporting fatigue and ongoing weight loss. We discussed the possibilities that this is esbriet which Is very likely. However, today walking Pacer HR stuck at 60/min. He is also on Jardiance which contributes to weight loss. Weight currently at Ideal body weight. We discussed that any further weight loss a-> then we would have to stop esbriet or jardiance ((likely esbriet) for a 1-2 months and then see if weight goes up.He does not like the idea of stopping esbriet.   So, we diuscussed calorie count and improving nutrition  He has also decided against participating in clniical trial - His QTcF baseline is prolonged anyways. He is also asking for a spacer for Breztri      OV 07/16/2022  Subjective:  Patient ID: [name], male , DOB: 09/28/1940 , age 81 y.o. , MRN: [id number] , ADDRESS: [address] [place] [address] PCP Sheliah Hatch, MD Patient Care Team: Sheliah Hatch, MD as PCP - General (Family Medicine) Jerilee Field, MD as Consulting Physician (Urology) Karie Soda, MD as Consulting Physician (General Surgery) Lewayne Bunting, MD as Consulting Physician (Cardiology) Flo Shanks, MD as Consulting Physician (Otolaryngology) Duke Salvia, MD as Consulting Physician (Cardiology) Maris Berger, MD as Consulting Physician (Ophthalmology) Delton Coombes Les Pou, MD as Consulting Physician (Pulmonary Disease) Napoleon Form, MD as Consulting Physician (Gastroenterology) Erroll Luna, [facility] (Inactive) (Pharmacist)  This Provider for this visit: Treatment Team:  Attending Provider: Kalman Shan, MD    07/16/2022 -   Chief Complaint  Patient presents with   Follow-up    F/up on PFT      HPI RAINER SAYARATH 82 y.o. -returns for follow-up.  This is a routine follow-up.  But he does have an interim acute issue.  He says last week 3 times his AICD did not go off but he felt tachycardic.  He then got a call alert saying he had V. tach.  It appears the pacemaker prevented the V. tach.  He is really worried about this.  He has upcoming appointment Dr. Hurman Horn.  I did review the external records yesterday from cardiology and they have increased his carvedilol.  He is wondering if his pulmonary issues or any of his medications could be putting him at risk for V. tach.  In addition he also states that despite this, he has been in  the yard last week with his golf cart and stayed out a long time.  He did sweat he did get a little dehydrated.  I did indicate to him this might be the reason may be some electrolyte imbalance.  In addition he is on Breztri for his mild COPD.  He does have long-acting beta agonist although the safety record of this does not show any V. tach I did indicate to him that we could just push to Spiriva.  He is fine with this.    Other issues - From a respiratory standpoint he feels stable but symptom scores  below show worsening. But sit stand hypoxemia test he did not desaturate.  But his pulmonary function test does show decline.    - From a pirfenidone tolerance standpoint he is tolerating it well.  He is improved his diet by taking MiraLAX 3-4 times a day.  He is also eating fish 3 times a week and a lot of fruits and vegetables.  With this his bowel regimen is improved.  He is no longer having diarrhea.  He has some intermittent constipation but he feels overall he is in a good spot.  This because he used to suffer from significant alternating.  - re Esbriet/Pirfenidone requires intensive drug monitoring due to high concerns for Adverse effects of , including  Drug Induced Liver Injury, significant GI side effects that include but not limited to Diarrhea, Nausea, Vomiting,  and other system side effects that include Fatigue, headaches, weight loss and other side effects such as skin rash. These will be monitored with  blood work such as LFT initially once a month for 6 months and then quarterly   -Acid reflux: His not taking his omeprazole because of fears of kidney side effects  -Weight loss: He says he is eating a lot of ice cream and he stopped losing weight.  He has now gained some weight.   Symptoms:   OV 10/14/2022  Subjective:  Patient ID: [name], male , DOB: 03/02/1940 , age 52 y.o. , MRN: [id number] , ADDRESS: [address] [place] [address] PCP Sheliah Hatch, MD Patient Care Team: Sheliah Hatch, MD as PCP - General (Family Medicine) Jerilee Field, MD as Consulting Physician (Urology) Karie Soda, MD as Consulting Physician (General Surgery) Jens Som Madolyn Frieze, MD as Consulting Physician (Cardiology) Flo Shanks, MD as Consulting Physician (Otolaryngology) Duke Salvia, MD as Consulting Physician (Cardiology) Maris Berger, MD as Consulting Physician (Ophthalmology) Delton Coombes Les Pou, MD as Consulting Physician (Pulmonary  Disease) Napoleon Form, MD as Consulting Physician (Gastroenterology) Erroll Luna, [facility] (Inactive) (Pharmacist)  This Provider for this visit: Treatment Team:  Attending Provider: Kalman Shan, MD    10/14/2022 -   Chief Complaint  Patient presents with   Follow-up    ILD- cough is bad now about 50% dry and 50% wet       HPI ARBER DOOLING 82 y.o. -last seen in July 2024.  Presents with his son Will who is the younger of the 2 sons.  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  At the time of his last visit he reported an episode of ventricular tachycardia and just out of abundance of precaution we stopped his Markus Daft and put him on Spiriva.  But he says his dyspnea is worse after the change.  He wants to go back to [place].  I did indicate to him the literature shows that the  risk of ventricular tachycardia and long-acting beta agonist is negligible.  Therefore he wants to go back on [place].  For his interval ventricular tachycardia is now taking ranolazine and therefore it is improved this is according to chart review from Dr. Hurman Horn visit visit August 16, 2022.  Then on August 17, 2022 he did call us with some hemoptysis and discolored sputum.  We called in some cephalexin.  He did follow-up with Dr. Olga Millers cardiology on August 21, 2022.  For his heart failure was advised to continue Jardiance Pyrolite on Lasix.  He was also asked to continue Entresto and carvedilol.  He was also asked to continue his Xarelto and statins.  For his ventricular tachycardia was going to continue his beta-blocker and mexiletine along with ranolazine.  He then followed up with nurse practitioner August 28, 2022.  He did report significant worsening of cough since stopping Breztri a lot of wheezing as well.  Increase use of lev albuterol.  We called in a short course prednisone.  With this is hemoptysis resolved.  We then ordered a high-resolution CT chest.  No  nodules.  The ILD slowly progressive over the last 1 year.  I did inform today of his progressive nature of pulmonary fibrosis.  Then on September 18, 2022 he had a new wound in his left forearm.  Primary care Dr. Beverely Low gave him 7 days of cephalexin.  He is here to discuss all this today.  In addition he tells me that he is going to get into the donut hole for pirfenidone.  The program is ending.  His son said the catastrophic insurance coverage of $3000 should not be a problem.  But patient wants me to touch base with pharmacy I have sent them a message.    Discussed clinical trials as a care option    - Subcutaneous injection monoclonal antibody Vixarelimab: Every 2 weeks.  The study is no longer enrolling patients who are on standard of care therapy.   - Oral tablet Bexotegrast - Pliant study.  33% chance of placebo.  Well-tolerated in the early phase to 62-month study.  Minimal GI side effects.  Initial improvement over 4 weeks and then some tailing off on preliminary data but still significant improvement compared to placebo.  He is interested in this.  However personally visualize EKGs August 2024 QTc is 4 ms in December 2023 it was 600 ms.    - -  Inhaled treprostinil study 301 by [employer]..: Preliminary study shows beneficial effects and IPF with lung function improvement.  This medicine is available in the market for many years for pulmonary hypertension.  He will have to take it as a nebulizer 4 times daily and each time 9-12 times.  The study visits are not too cumbersome.  However in the past he found it inconvenient from a medication administration standpoint.  Given his decline there was a study team to prescreen him for this.  This might be his best option provided cardiac issues are not a contraindication.    OV 11/05/2022  Subjective:  Patient ID: [name], male , DOB: 1940/09/16 , age 81 y.o. , MRN: [id number] , ADDRESS: [address] [place]  [address] PCP Sheliah Hatch, MD Patient Care Team: Sheliah Hatch, MD as PCP - General (Family Medicine) Jerilee Field, MD as Consulting Physician (Urology) Karie Soda, MD as Consulting Physician (General Surgery) Lewayne Bunting, MD as Consulting Physician (Cardiology) Flo Shanks, MD as  Consulting Physician (Otolaryngology) Duke Salvia, MD as Consulting Physician (Cardiology) Maris Berger, MD as Consulting Physician (Ophthalmology) Delton Coombes Les Pou, MD as Consulting Physician (Pulmonary Disease) Napoleon Form, MD as Consulting Physician (Gastroenterology) Erroll Luna, [facility] (Inactive) (Pharmacist)  This Provider for this visit: Treatment Team:  Attending Provider: Kalman Shan, MD    11/05/2022 -   Chief Complaint  Patient presents with   Acute Visit    Acute visit, pt has concerns of blood in phlegm in the mornings ( red ) pt states he's cough is worse. 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  Having excellent high-fiber fish based diet.  GErd on ppi  Chronic systolic heart failure S/p pacer - permananet A Fib Prolonged QTc [December 2023 QTc 60 ms in August 2020 for final milliseconds]. Hx V Tach  -2017 ejection fraction 25%  -May 2023 ejection fraction 30-35   Esbriet/Pirfenidone requires intensive drug monitoring due to high concerns for Adverse effects of , including  Drug Induced Liver Injury, significant GI side effects that include but not limited to Diarrhea, Nausea, Vomiting,  and other system side effects that include Fatigue, headaches, weight loss and other side effects such as skin rash. These will be  monitored with  blood work such as LFT initially once a month for 6 months and then quarterly   HPI KALETH JACKS 82 y.o. -he is here for an acute visit.  This because of hemoptysis.  He tells me that late in summer 2020 for his approximately September 2024 he had hemoptysis.  He was given Keflex and it went away but then it came back.  Nurse practitioner gave steroids it went away but now it is back since October 29, 2022.  Its present only 1 time early in the morning when he brings out his sputum.  He feels like the something behind his sternum that he has to cough out is there with mucus.  He is on Xarelto.  There is no change in the shortness of breath there is no fever.  He was recently given Keflex again but it does not help.  He brought in the tissues of his hemoptysis and plastic packet for me to see this.  He is really concerned about this.  He is wondering about repeat steroid course.  He is wondering about repeat antibiotic course.  He is also wondering about the etiology.   Discussed with our pharmacist.  Doxycycline is preferred over azithromycin because of QT prolongation therefore we will do doxycycline.    SYMPTOM S CALE - ILD 11/08/2020 12/22/2020 Esbriet since sept/pct 2022 01/19/2021 Esbreit 2 pills tid 03/09/2021  05/07/2021  08/03/2021  11/08/2021  02/21/2022  07/16/2022  10/14/2022  11/05/2022 Acute visit with hemoptysis  Current weight 209# 207# 206# 199# 194# 190# - esbriet 185# - esbrieet, jardiance 183# 191# 185# ebiret . Off breztri   O2 use ra ra           Shortness of Breath 0 -> 5 scale with 5 being worst (score 6 If unable to do)            At rest 0 0 0 0  0 0 0 0 0 3 0  0Simple tasks - showers, clothes change, eating, shaving 0 0 0 0 0 0 1 0 0 2  0  Household (dishes, doing bed, laundry) 0 0 0 0 0 0 1 0 1 2  1  Shopping 3 2 0 4 4 3 5 4 5 5  5  Walking level at own pace 4 2 2 1 1 1 2 3 4 5 4   Walking up Stairs 5 5 3 4 4 4 4 5 4 5 5   Total (30-36) Dyspnea Score 12 x 2  years  same Since onset 9 5 9 9 8 13 12 15 21 15   How bad is your cough? moderate x  2years. Getting worse. Has light yellow/green sputum. Clears throat 1 4 4 3 4 4 5 5 5 5   How bad is your fatigue light 5 4 4 3 5 4 3 4 5  0  How bad is nausea 0 1 2 1 0 3 2 0 0 0  0  How bad is vomiting?  0 0 0 0 0 0 0 0 00 0  0  How bad is diarrhea? 0 4 5 1 2 4 4 4 0 0  0  How bad is anxiety? 0 4 3 1 0 2 0 0 0 3  0  How bad is depression 0 1 2 0 0 0 0 0 0 1  0  Any chronic pain - if so where and how bad 0     x x  0  0     Simple office walk 185 feet x  3 laps goal with forehead probe 10/05/2020  03/09/2021  08/03/2021  02/21/2022  07/16/2022  10/14/2022   O2 used ra ra ra ra ra ra  Number laps completed 3 3 3 2  Sit stand Sistt sand x 10  Comments about pace avg avg nl     Resting Pulse Ox/HR 100% and 70/min 99% and 70 97% and HR 60 99% 96% and HR 71 97% and HR 77  Final Pulse Ox/HR 97% and 82/min 97% and 82 98% and HR 72 97% 95% and HR 60 95% and HR 68  Desaturated </= 88% no no np     Desaturated <= 3% points yes no no     Got Tachycardic >/= 90/min no no no     Symptoms at end of test Mild dyspnea no no  no   Miscellaneous comments x x         PFT     Latest Ref Rng & Units 07/16/2022    2:24 PM 02/21/2022    2:53 PM 08/03/2021    2:41 PM 01/19/2021   12:54 PM 09/27/2020    9:51 AM 02/09/2018    1:53 PM 03/12/2017    1:55 PM  ILD indicators  FVC-Pre L 2.28  2.46  2.43  2.67  2.34  3.02  2.82   FVC-Predicted Pre % 56  58  57  63  55  69  64   FVC-Post L     2.44  2.97  2.78   FVC-Predicted Post %     57  67  63   TLC L     4.17  4.65  4.94   TLC Predicted %     57  64  68   DLCO uncorrected ml/min/mmHg 14.04  15.74  14.82  14.09  14.62  18.10  18.46   DLCO UNC %Pred % 57  62  58  55  57  53  54   DLCO Corrected ml/min/mmHg 13.50  15.74  14.82  14.09  14.62  17.90    DLCO COR %Pred % 55  62  58  55  57  53        LAB RESULTS last 96 hours No results found.  LAB RESULTS last 90 days Recent  Results (from the past 2160 hour(s))  CUP PACEART REMOTE DEVICE CHECK     Status: None   Collection Time: 08/16/22  1:34 AM  Result Value Ref Range   Date Time Interrogation Session [id number]    Pulse Generator Manufacturer BOST    Pulse Gen Model G125 MOMENTUM CRT-D    Pulse Gen Serial Number [id number]    Clinic Name [facility]    Implantable Pulse Generator Type Cardiac Resynch Therapy Defibulator    Implantable Pulse Generator Implant Date [id number]    Implantable Lead Manufacturer [facility]    Implantable Lead Model 4194 Attain OTW    Implantable Lead Serial Number [id number]    Implantable Lead Implant Date [id number]    Implantable Lead Location Detail 1 Lateral Wall    Implantable Lead Location [id number]    Implantable Lead Connection Status [id number]    Implantable Lead Manufacturer GUIC    Implantable Lead Model 0148 Endotak Reliance    Implantable Lead Serial Number [id number]    Implantable Lead Implant Date 62952841    Implantable Lead Location Detail 1 APEX    Implantable Lead Special Function Diagnosis:  Sustained monomorphic VT    Implantable Lead Location [id number]    Implantable Lead Connection Status [id number]    Implantable Lead Manufacturer MERM    Implantable Lead Model 6940 CapSureFix    Implantable Lead Serial Number [id number]    Implantable Lead Implant Date [id number]    Implantable Lead Location Detail 1 APPENDAGE    Implantable Lead Special Function Diagnosis:  Sustained monomorphic VT    Implantable Lead Location [id number]    Implantable Lead Connection Status 725366    Lead Channel Setting Sensing Sensitivity 0.6 mV   Lead Channel Setting Sensing Adaptation Mode Adaptive Sensing    Lead Channel Setting Sensing Sensitivity 1.0 mV   Lead Channel Setting Sensing Adaptation Mode Adaptive Sensing    Lead Channel Setting Pacing Pulse Width 0.4 ms   Lead Channel Setting Pacing Amplitude 2.5 V   Lead Channel Setting Pacing Pulse Width 1.0 ms   Lead Channel Setting Pacing  Amplitude 2.5 V   Lead Channel Setting Pacing Capture Mode Fixed Pacing    Zone Setting Status Active    Zone Setting Status Active    Lead Channel Impedance Value 680 ohm   Lead Channel Impedance Value 654 ohm   HighPow Impedance 43 ohm   Battery Status BOS    Battery Remaining Longevity 102 mo   Battery Remaining Percentage 100 %   Brady Statistic RA Percent Paced 0 %   Brady Statistic RV Percent Paced 95 %  CUP PACEART INCLINIC DEVICE CHECK     Status: None   Collection Time: 08/16/22 12:26 PM  Result Value Ref Range   Pulse Generator Manufacturer BOST    Date Time Interrogation Session [phone]    Pulse Gen Model G125 MOMENTUM CRT-D    Pulse Gen Serial Number [id number]    Clinic Name [facility]    Implantable Pulse Generator Type Cardiac Resynch Therapy Defibulator    Implantable Pulse Generator Implant Date [id number]    Implantable Lead Manufacturer MERM  Implantable Lead Model 4194 Attain OTW    Implantable Lead Serial Number [id number]    Implantable Lead Implant Date 82956213    Implantable Lead Location Detail 1 Lateral Wall    Implantable Lead Location [id number]    Implantable Lead Connection Status [phone]    Implantable Lead Manufacturer GUIC    Implantable Lead Model 0148 Endotak Reliance    Implantable Lead Serial Number [id number]    Implantable Lead Implant Date 46962952    Implantable Lead Location Detail 1 APEX    Implantable Lead Special Function Diagnosis:  Sustained monomorphic VT    Implantable Lead Location 753860    Implantable Lead Connection Status 753985    Implantable Lead Manufacturer [facility]    Implantable Lead Model 6940 CapSureFix    Implantable Lead Serial Number [id number]    Implantable Lead Implant Date [id number]    Implantable Lead Location Detail 1 APPENDAGE    Implantable Lead Special Function Diagnosis:  Sustained monomorphic VT    Implantable Lead Location 753859    Implantable Lead Connection Status 753985   Comp Met (CMET)      Status: Abnormal   Collection Time: 08/28/22  4:43 PM  Result Value Ref Range   Sodium 134 (L) 135 - 145 mEq/L   Potassium 4.9 3.5 - 5.1 mEq/L   Chloride 97 96 - 112 mEq/L   CO2 30 19 - 32 mEq/L   Glucose, Bld 115 (H) 70 - 99 mg/dL   BUN 13 6 - 23 mg/dL   Creatinine, Ser 4.40 0.40 - 1.50 mg/dL   Total Bilirubin 0.5 0.2 - 1.2 mg/dL   Alkaline Phosphatase 62 39 - 117 U/L   AST 24 0 - 37 U/L   ALT 17 0 - 53 U/L   Total Protein 6.9 6.0 - 8.3 g/dL   Albumin 3.6 3.5 - 5.2 g/dL   GFR 34.74 >25.95 mL/min    Comment: Calculated using the CKD-EPI Creatinine Equation (2021)   Calcium 9.2 8.4 - 10.5 mg/dL  Hepatic function panel     Status: None   Collection Time: 10/15/22  2:53 PM  Result Value Ref Range   Total Bilirubin 0.6 0.2 - 1.2 mg/dL   Bilirubin, Direct 0.2 0.0 - 0.3 mg/dL   Alkaline Phosphatase 70 39 - 117 U/L   AST 23 0 - 37 U/L   ALT 17 0 - 53 U/L   Total Protein 7.0 6.0 - 8.3 g/dL   Albumin 3.8 3.5 - 5.2 g/dL  B Nat Peptide     Status: Abnormal   Collection Time: 10/15/22  2:53 PM  Result Value Ref Range   Pro B Natriuretic peptide (BNP) 311.0 (H) 0.0 - 100.0 pg/mL         has a past medical history of Adenomatous colon polyp (2009; 06/2012), AICD (automatic cardioverter/defibrillator) present, Atrial fibrillation (HCC), Bilateral sensorineural hearing loss, BPH (benign prostatic hypertrophy), CAD (coronary artery disease), CHF (congestive heart failure) (HCC), Chronic constipation, Chronic rhinitis, COPD (chronic obstructive pulmonary disease) (HCC), Diverticulosis of colon (2005), Gallstones (06/2014), GERD (gastroesophageal reflux disease), Hepatic steatosis (2010; 2016), Hyperlipidemia, Hypertension, Ischemic cardiomyopathy, Microscopic hematuria, Nephrolithiasis, Obstructive sleep apnea, Presence of permanent cardiac pacemaker, Squamous cell carcinoma of back, and Ventricular tachycardia (HCC).   reports that he quit smoking about 42 years ago. His smoking use included  cigarettes. He started smoking about 57 years ago. He has a 30 pack-year smoking history. He has never used smokeless tobacco.  Past Surgical History:  Procedure Laterality Date  ATRIAL ABLATION SURGERY     flutter ablation 2002   BREAST SURGERY Left 1990   "Tissue removed from breast"   CARDIAC CATHETERIZATION     CARDIOVERSION N/A 05/10/2020   Procedure: CARDIOVERSION;  Surgeon: Thurmon Fair, MD;  Location: [facility];  Service: Cardiovascular;  Laterality: N/A;   carotid doppler  09/25/12   No signif stenosis   COLONOSCOPY N/A 07/13/2012   Diverticula ascending colon.  Polyp x 1-recall 5 yrs (Dr. Ewing Schlein) Procedure: COLONOSCOPY;  Surgeon: Petra Kuba, MD;  Location: [facility];  Service: Endoscopy;  Laterality: N/A; polypectomy x 1    COLONOSCOPY WITH PROPOFOL N/A 08/18/2015   Procedure: COLONOSCOPY WITH PROPOFOL;  Surgeon: Napoleon Form, MD;  Location: [facility];  Service: Endoscopy;  Laterality: N/A;   ESOPHAGOGASTRODUODENOSCOPY N/A 07/13/2012   Procedure: ESOPHAGOGASTRODUODENOSCOPY (EGD);  Surgeon: Petra Kuba, MD;  Location: [facility];  Service: Endoscopy;  Laterality: N/A;  NORMAL   EXTRACORPOREAL SHOCK WAVE LITHOTRIPSY     HEMORRHOID BANDING     ICD     a) Guidant Contak H170- Sherryl Manges, MD 07/25/06 (second device) b) Medtronic CRT-D   ICD GENERATOR CHANGEOUT N/A 02/14/2020   Procedure: ICD GENERATOR CHANGEOUT;  Surgeon: Duke Salvia, MD;  Location: [facility] INVASIVE CV LAB;  Service: Cardiovascular;  Laterality: N/A;   INGUINAL HERNIA REPAIR Bilateral 1975   LEAD REVISION Bilateral 12/26/2011   Procedure: LEAD REVISION;  Surgeon: Marinus Maw, MD;  [facility]: [facility] CATH LAB;  Service: Cardiovascular;  Laterality: Bilateral;   PFTs  02/2015   Mod restriction (interstitial), mod diffusion defect (Dr. Maple Hudson)   precancer area keratsis removed  05/2015    leftf orehead and left arm and right arm and back    Allergies  Allergen Reactions   Sulfa Antibiotics Rash     Immunization History  Administered Date(s) Administered   Fluad Quad(high Dose 65+) 09/23/2018, 10/05/2020, 11/08/2021, 09/30/2022   Influenza Split 11/11/2010, 11/02/2012, 10/14/2013   Influenza Whole 09/27/2011, 11/13/2011   Influenza, High Dose Seasonal PF 09/26/2015, 10/07/2016   Influenza, Seasonal, Injecte, Preservative Fre 09/23/2018   Influenza,inj,Quad PF,6+ Mos 11/03/2016, 09/20/2017   Influenza-Unspecified 09/26/2014, 10/15/2019, 11/11/2021   PFIZER Comirnaty(Gray Top)Covid-19 Tri-Sucrose Vaccine 04/24/2020   PFIZER(Purple Top)SARS-COV-2 Vaccination 02/02/2019, 02/22/2019, 10/26/2019, 09/29/2020, 11/14/2021   Pfizer(Comirnaty)Fall Seasonal Vaccine 12 years and older 09/30/2022   Pneumococcal Conjugate-13 06/15/2014   Pneumococcal Polysaccharide-23 03/15/2002, 07/10/2015   Respiratory Syncytial Virus Vaccine,Recomb Aduvanted(Arexvy) 11/14/2021   Td 08/27/2017   Tdap 04/04/2010   Zoster Recombinant(Shingrix) 03/29/2016   Zoster, Live 05/15/2015    Family History  Problem Relation Age of Onset   Heart disease Mother    Other Mother        colon surgery for fistula   Gallstones Mother    Breast cancer Maternal Grandmother    Coronary artery disease Father        family hx of   Heart attack Father        4 stents/ pacemaker   Nephrolithiasis Father    Hypertension Sister    Goiter Paternal Grandmother    Nephrolithiasis Son    Colon cancer Neg Hx      Current Outpatient Medications:    antiseptic oral rinse (BIOTENE) LIQD, 15 mLs by Mouth Rinse route in the morning and at bedtime., Disp: , Rfl:    BREZTRI AEROSPHERE 160-9-4.8 MCG/ACT AERO, INHALE 2 PUFFS INTO THE LUNGS IN THE MORNING AND AT BEDTIME, Disp: 10.7 g, Rfl: 6   Budeson-Glycopyrrol-Formoterol (BREZTRI AEROSPHERE) 160-9-4.8  MCG/ACT AERO, Inhale 2 puffs into the lungs in the morning and at bedtime., Disp: , Rfl:    carvedilol (COREG) 3.125 MG tablet, TAKE 1 TABLET(3.125 MG) BY MOUTH IN THE AM AND TAKE 2  TABLETS (3.125 MG) TOTAL OF 6.25 MG BY MOUTH IN THE PM WITH A MEAL., Disp: 180 tablet, Rfl: 3   cholecalciferol (VITAMIN D) 1000 units tablet, Take 1,000 Units by mouth every evening., Disp: , Rfl:    clotrimazole (MYCELEX) 10 MG troche, Dissolve, swish and swallow one tablet by mouth 5 times daily for 14 days., Disp: 70 tablet, Rfl: 1   ENTRESTO 24-26 MG, TAKE 1 TABLET BY MOUTH TWICE DAILY, Disp: 180 tablet, Rfl: 3   eplerenone (INSPRA) 25 MG tablet, TAKE 1 TABLET(25 MG) BY MOUTH DAILY, Disp: 90 tablet, Rfl: 3   furosemide (LASIX) 40 MG tablet, Take 1 tablet (40 mg total) by mouth daily., Disp: 90 tablet, Rfl: 3   ipratropium (ATROVENT) 0.06 % nasal spray, Place 2 sprays into both nostrils 3 (three) times daily., Disp: 15 mL, Rfl: 1   JARDIANCE 10 MG TABS tablet, TAKE 1 TABLET(10 MG) BY MOUTH DAILY BEFORE BREAKFAST, Disp: 90 tablet, Rfl: 3   levalbuterol (XOPENEX HFA) 45 MCG/ACT inhaler, INHALE 2 PUFFS INTO THE LUNGS EVERY 4 HOURS AS NEEDED FOR WHEEZING, Disp: 15 g, Rfl: 6   loratadine (CLARITIN) 10 MG tablet, Take 10 mg by mouth in the morning., Disp: , Rfl:    Magnesium 250 MG TABS, Take 250 mg by mouth every evening., Disp: , Rfl:    mexiletine (MEXITIL) 150 MG capsule, TAKE 2 CAPSULES(300 MG) BY MOUTH TWICE DAILY, Disp: 360 capsule, Rfl: 2   Multiple Vitamin (MULTIVITAMIN WITH MINERALS) TABS, Take 1 tablet by mouth every evening., Disp: , Rfl:    nitroGLYCERIN (NITROSTAT) 0.4 MG SL tablet, DISSOLVE 1 TABLET UNDER THE TONGUE EVERY 5 MINUTES AS NEEDED FOR CHEST PAIN, Disp: 25 tablet, Rfl: 5   Olopatadine HCl (PATADAY OP), Apply 1 drop to eye daily., Disp: , Rfl:    Pirfenidone (ESBRIET) 267 MG TABS, Take 2 tablets (534 mg total) by mouth with breakfast, with lunch, and with evening meal., Disp: 540 tablet, Rfl: 1   polyvinyl alcohol-povidone (HYPOTEARS) 1.4-0.6 % ophthalmic solution, Place 1-2 drops into both eyes in the morning and at bedtime., Disp: , Rfl:    potassium chloride SA (KLOR-CON M)  20 MEQ tablet, Take 1 tablet (20 mEq total) by mouth daily with lunch., Disp: 90 tablet, Rfl: 3   ranolazine (RANEXA) 500 MG 12 hr tablet, Take 1 tablet (500 mg total) by mouth 2 (two) times daily., Disp: 180 tablet, Rfl: 3   rivaroxaban (XARELTO) 20 MG TABS tablet, TAKE 1 TABLET(20 MG) BY MOUTH DAILY WITH SUPPER, Disp: 90 tablet, Rfl: 3   rosuvastatin (CRESTOR) 40 MG tablet, TAKE 1 TABLET(40 MG) BY MOUTH EVERY EVENING, Disp: 90 tablet, Rfl: 3   Spacer/Aero-Holding Chambers (AEROCHAMBER MV) inhaler, Use as instructed, Disp: 1 each, Rfl: 0   ciclopirox (PENLAC) 8 % solution, APPLY OVER NAIL AND SURROUNDING SKIN EVERY NIGHT AT BEDTIME. APPLY OVER PREVIOUSE COATS FOR 7 DAYS THEN REMOVE WITH ALCOHOL AND CONTINUE (Patient not taking: Reported on 11/05/2022), Disp: 6.6 mL, Rfl: 0   Tiotropium Bromide Monohydrate (SPIRIVA RESPIMAT) 1.25 MCG/ACT AERS, Inhale 2 puffs into the lungs daily. (Patient not taking: Reported on 11/05/2022), Disp: 4 g, Rfl: 5      Objective:   Vitals:   11/05/22 1135  BP: 115/75  Pulse: 65  SpO2: 95%  Weight: 185 lb 6.4 oz (84.1 kg)  Height: 5\' 10"  (1.778 m)    Estimated body mass index is 26.6 kg/m as calculated from the following:   Height as of this encounter: 5\' 10"  (1.778 m).   Weight as of this encounter: 185 lb 6.4 oz (84.1 kg).  @WEIGHTCHANGE @  [employer]   11/05/22 1135  Weight: 185 lb 6.4 oz (84.1 kg)     Physical Exam   General: No distress. Looks same O2 at rest: no Cane present: no Sitting in wheel chair: no Frail: no Obese: non Neuro: Alert and Oriented x 3. GCS 15. Speech normal Psych: Pleasant Resp:  Barrel Chest - no.  Wheeze - no, Crackles - YES, No overt respiratory distress CVS: Normal heart sounds. Murmurs - no Ext: Stigmata of Connective Tissue Disease - no HEENT: Normal upper airway. PEERL +. No post nasal drip        Assessment:       ICD-10-CM   1. Cough with hemoptysis  R04.2 DG Chest 2 View    CANCELED:  QuantiFERON-TB Gold Plus    CANCELED: CBC with Differential    2. Acute bronchitis, unspecified organism  J20.9 DG Chest 2 View    CANCELED: QuantiFERON-TB Gold Plus    CANCELED: CBC with Differential    3. Dyspnea, unspecified type  R06.00 CANCELED: B Nat Peptide    4. Chronic systolic congestive heart failure (HCC)  I50.22            Plan:     Patient Instructions     ICD-10-CM   1. Cough with hemoptysis  R04.2     2. Acute bronchitis, unspecified organism  J20.9       Unclear reason why you are having hemoptysis which is coughing up of blood.  Xarelto was making it easier for you to cough up blood but it is not the primary reason.  The primary reason could be  -Worsening pulm fibrosis  -Acute bronchitis  -Perhaps a tumor but CT scan September 2024 did not show any mass   -Colonization with microorganisms  - could be heart failure  Plan  -Chest x-ray two-view today - Check blood BNP, CBC with differential -Check blood QuantiFERON gold  - Take doxycycline 100mg  po twice daily x 5 days; take after meals and avoid sunlight - Take prednisone 40 mg daily x 2 days, then 20mg  daily x 2 days, then 10mg  daily x 2 days, then 5mg  daily x 2 days and stop  -If coughing up of blood gets worse then go to the ER  -Might have to consider bronchoscopy if problems record/persist  Follow-up -Call us in 1 to 2 weeks with response - 6 weeks to spirometry and DLCO -Return to see Dr. Marchelle Gearing in 6 weeks but after breathing test    FOLLOWUP No follow-ups on file.    SIGNATURE    Dr. Kalman Shan, M.D., F.C.C.P,  Pulmonary and Critical Care Medicine Staff Physician, [facility] Center Director - Interstitial Lung Disease  Program  Pulmonary Fibrosis [facility] Network at [facility] [place], [place], [address]  Pager: [phone], If no answer or between  15:00h - 7:00h: call [phone] Telephone: [phone]  12:05 PM 11/05/2022

## 2022-11-06 ENCOUNTER — Other Ambulatory Visit (HOSPITAL_BASED_OUTPATIENT_CLINIC_OR_DEPARTMENT_OTHER): Payer: Self-pay | Admitting: Internal Medicine

## 2022-11-06 ENCOUNTER — Telehealth: Payer: Self-pay | Admitting: Internal Medicine

## 2022-11-06 DIAGNOSIS — R042 Hemoptysis: Secondary | ICD-10-CM | POA: Diagnosis not present

## 2022-11-06 DIAGNOSIS — J209 Acute bronchitis, unspecified: Secondary | ICD-10-CM | POA: Diagnosis not present

## 2022-11-06 MED ORDER — CEPHALEXIN 500 MG PO CAPS: 500.0000 mg | ORAL_CAPSULE | Freq: Three times a day (TID) | ORAL | 0 refills | Status: DC

## 2022-11-06 NOTE — Telephone Encounter (Signed)
I called and spoke with the pt  He states that after he took doxy and pred this am (prescribed at ov yesterday for acute bronchitis)  He took meds with a meal  About 30 min after he had 2 episodes of vomiting  He is asking for a different abx to be sent  He states he tolerates keflex  I added doxy to allergy list  He tolerates pred  Please advise, thanks!  Allergies  Allergen Reactions   Sulfa Antibiotics Rash   Doxycycline Nausea And Vomiting

## 2022-11-06 NOTE — Telephone Encounter (Signed)
Both the doxycycline and prednisone has made the patient throw up and patient will discontinue until further notice

## 2022-11-06 NOTE — Telephone Encounter (Signed)
Spoke with the pt and notified of response per MR  Pt verbalized understanding  Nothing further needed  Rx for cephalexin sent to pharm

## 2022-11-06 NOTE — Telephone Encounter (Signed)
Ok  cephalexin 500mg  three times daily x  7  days

## 2022-11-08 ENCOUNTER — Telehealth: Payer: Self-pay | Admitting: Internal Medicine

## 2022-11-08 DIAGNOSIS — R0609 Other forms of dyspnea: Secondary | ICD-10-CM

## 2022-11-08 DIAGNOSIS — J84112 Idiopathic pulmonary fibrosis: Secondary | ICD-10-CM

## 2022-11-08 NOTE — Telephone Encounter (Signed)
   Spoke to patient  - responding to prednisone and cephalexin and feeling better and in fact even runny nose better - blood test shows slight high BNP and chronincally elevated high EOS 500cllc/cumm - says he has prior hx of allergies  Plan  - in 2 weeks do echo  - in 2 weeks   do RAST allergy panel with IgE - he will stay in touch regarding his course      SIGNATURE    Dr. Kalman Shan, M.D., F.C.C.P,  Pulmonary and Critical Care Medicine Staff Physician, [facility] Center Director - Interstitial Lung Disease  Program  Pulmonary Fibrosis [facility] Network at [facility] [place], [place], [address]   Pager: [phone], If no answer  -> Check AMION or Try [phone] Telephone (clinical office): [phone] Telephone (research): [phone]  5:11 PM 11/08/2022   -

## 2022-11-09 LAB — QUANTIFERON-TB GOLD PLUS
QuantiFERON Mitogen Value: >10 [IU]/mL
QuantiFERON Nil Value: 0 [IU]/mL
QuantiFERON TB1 Ag Value: 0 [IU]/mL
QuantiFERON TB2 Ag Value: 0 [IU]/mL
QuantiFERON-TB Gold Plus: NEGATIVE

## 2022-11-09 LAB — CBC WITH DIFFERENTIAL/PLATELET
Basophils Absolute: 0 10*3/uL (ref 0.0–0.2)
Basos: 0 %
EOS (ABSOLUTE): 0.1 10*3/uL (ref 0.0–0.4)
Eos: 1 %
Hematocrit: 46.6 % (ref 37.5–51.0)
Hemoglobin: 15.3 g/dL (ref 13.0–17.7)
Immature Grans (Abs): 0 10*3/uL (ref 0.0–0.1)
Immature Granulocytes: 0 %
Lymphocytes Absolute: 1.8 10*3/uL (ref 0.7–3.1)
Lymphs: 15 %
MCH: 34.2 pg — ABNORMAL HIGH (ref 26.6–33.0)
MCHC: 32.8 g/dL (ref 31.5–35.7)
MCV: 104 fL — ABNORMAL HIGH (ref 79–97)
Monocytes Absolute: 0.8 10*3/uL (ref 0.1–0.9)
Monocytes: 7 %
Neutrophils Absolute: 9.1 10*3/uL — ABNORMAL HIGH (ref 1.4–7.0)
Neutrophils: 77 %
Platelets: 385 10*3/uL (ref 150–450)
RBC: 4.47 x10E6/uL (ref 4.14–5.80)
RDW: 12 % (ref 11.6–15.4)
WBC: 11.8 10*3/uL — ABNORMAL HIGH (ref 3.4–10.8)

## 2022-11-09 LAB — BRAIN NATRIURETIC PEPTIDE: BNP: 389.7 pg/mL — ABNORMAL HIGH (ref 0.0–100.0)

## 2022-11-12 NOTE — Telephone Encounter (Signed)
Orders for ECHO and labs placed.

## 2022-11-14 ENCOUNTER — Telehealth: Payer: Self-pay | Admitting: Internal Medicine

## 2022-11-14 DIAGNOSIS — R0600 Dyspnea, unspecified: Secondary | ICD-10-CM | POA: Diagnosis not present

## 2022-11-14 NOTE — Telephone Encounter (Signed)
Patient has finished taking the prednisone and antibiotics. Blood is no longer in sputum but it is brown. He's dosage was interrupted by him throwing up and starting new medication. He is headed out of town tomorrow morning and was wondering if something could be called in as an extension of his medication.

## 2022-11-15 ENCOUNTER — Ambulatory Visit (INDEPENDENT_AMBULATORY_CARE_PROVIDER_SITE_OTHER): Payer: Medicare Other

## 2022-11-15 DIAGNOSIS — I255 Ischemic cardiomyopathy: Secondary | ICD-10-CM

## 2022-11-15 LAB — CUP PACEART REMOTE DEVICE CHECK
Battery Remaining Longevity: 90 mo
Battery Remaining Percentage: 100 %
Brady Statistic RA Percent Paced: 0 %
Brady Statistic RV Percent Paced: 98 %
Date Time Interrogation Session: 20241101012200
HighPow Impedance: 42 Ohm
Implantable Lead Connection Status: 753985
Implantable Lead Connection Status: 753985
Implantable Lead Connection Status: 753985
Implantable Lead Implant Date: 20020527
Implantable Lead Implant Date: 20020527
Implantable Lead Implant Date: 20131212
Implantable Lead Location: 753858
Implantable Lead Location: 753859
Implantable Lead Location: 753860
Implantable Lead Model: 148
Implantable Lead Model: 4194
Implantable Lead Model: 6940
Implantable Lead Serial Number: 120244
Implantable Pulse Generator Implant Date: 20220131
Lead Channel Impedance Value: 622 Ohm
Lead Channel Impedance Value: 699 Ohm
Lead Channel Setting Pacing Amplitude: 2.5 V
Lead Channel Setting Pacing Amplitude: 2.5 V
Lead Channel Setting Pacing Pulse Width: 0.4 ms
Lead Channel Setting Pacing Pulse Width: 1 ms
Lead Channel Setting Sensing Sensitivity: 0.6 mV
Lead Channel Setting Sensing Sensitivity: 1 mV
Pulse Gen Serial Number: 149562

## 2022-11-18 NOTE — Telephone Encounter (Signed)
Just because of the color of the sputumI do not want to keep repeating antibiotics.  At this point in time if his sputum gets worse in color I would need to have a sputum specimen to understand what is going on

## 2022-11-24 ENCOUNTER — Encounter (HOSPITAL_COMMUNITY): Payer: Self-pay

## 2022-11-24 ENCOUNTER — Other Ambulatory Visit: Payer: Self-pay

## 2022-11-24 ENCOUNTER — Inpatient Hospital Stay (HOSPITAL_COMMUNITY)
Admission: EM | Admit: 2022-11-24 | Discharge: 2022-12-15 | DRG: 207 | Disposition: E | Payer: Medicare Other | Attending: Pulmonary Disease | Admitting: Pulmonary Disease

## 2022-11-24 ENCOUNTER — Emergency Department (HOSPITAL_COMMUNITY): Payer: Medicare Other

## 2022-11-24 DIAGNOSIS — Z66 Do not resuscitate: Secondary | ICD-10-CM | POA: Diagnosis present

## 2022-11-24 DIAGNOSIS — Z7951 Long term (current) use of inhaled steroids: Secondary | ICD-10-CM

## 2022-11-24 DIAGNOSIS — J440 Chronic obstructive pulmonary disease with acute lower respiratory infection: Secondary | ICD-10-CM | POA: Diagnosis not present

## 2022-11-24 DIAGNOSIS — J939 Pneumothorax, unspecified: Secondary | ICD-10-CM | POA: Diagnosis not present

## 2022-11-24 DIAGNOSIS — F05 Delirium due to known physiological condition: Secondary | ICD-10-CM | POA: Diagnosis not present

## 2022-11-24 DIAGNOSIS — I110 Hypertensive heart disease with heart failure: Secondary | ICD-10-CM | POA: Diagnosis present

## 2022-11-24 DIAGNOSIS — R0902 Hypoxemia: Secondary | ICD-10-CM | POA: Diagnosis not present

## 2022-11-24 DIAGNOSIS — J189 Pneumonia, unspecified organism: Secondary | ICD-10-CM | POA: Diagnosis present

## 2022-11-24 DIAGNOSIS — R739 Hyperglycemia, unspecified: Secondary | ICD-10-CM | POA: Diagnosis not present

## 2022-11-24 DIAGNOSIS — J849 Interstitial pulmonary disease, unspecified: Secondary | ICD-10-CM | POA: Diagnosis not present

## 2022-11-24 DIAGNOSIS — I4821 Permanent atrial fibrillation: Secondary | ICD-10-CM

## 2022-11-24 DIAGNOSIS — G3184 Mild cognitive impairment, so stated: Secondary | ICD-10-CM | POA: Diagnosis not present

## 2022-11-24 DIAGNOSIS — I10 Essential (primary) hypertension: Secondary | ICD-10-CM | POA: Diagnosis not present

## 2022-11-24 DIAGNOSIS — I428 Other cardiomyopathies: Secondary | ICD-10-CM | POA: Diagnosis present

## 2022-11-24 DIAGNOSIS — R0489 Hemorrhage from other sites in respiratory passages: Secondary | ICD-10-CM | POA: Diagnosis not present

## 2022-11-24 DIAGNOSIS — Z452 Encounter for adjustment and management of vascular access device: Secondary | ICD-10-CM | POA: Diagnosis not present

## 2022-11-24 DIAGNOSIS — J9601 Acute respiratory failure with hypoxia: Secondary | ICD-10-CM | POA: Diagnosis not present

## 2022-11-24 DIAGNOSIS — E440 Moderate protein-calorie malnutrition: Secondary | ICD-10-CM | POA: Diagnosis not present

## 2022-11-24 DIAGNOSIS — I2510 Atherosclerotic heart disease of native coronary artery without angina pectoris: Secondary | ICD-10-CM | POA: Diagnosis present

## 2022-11-24 DIAGNOSIS — Z4682 Encounter for fitting and adjustment of non-vascular catheter: Secondary | ICD-10-CM | POA: Diagnosis not present

## 2022-11-24 DIAGNOSIS — G4733 Obstructive sleep apnea (adult) (pediatric): Secondary | ICD-10-CM | POA: Diagnosis present

## 2022-11-24 DIAGNOSIS — J9621 Acute and chronic respiratory failure with hypoxia: Principal | ICD-10-CM | POA: Diagnosis present

## 2022-11-24 DIAGNOSIS — Z803 Family history of malignant neoplasm of breast: Secondary | ICD-10-CM

## 2022-11-24 DIAGNOSIS — R570 Cardiogenic shock: Secondary | ICD-10-CM | POA: Diagnosis present

## 2022-11-24 DIAGNOSIS — J9383 Other pneumothorax: Secondary | ICD-10-CM | POA: Diagnosis not present

## 2022-11-24 DIAGNOSIS — I5042 Chronic combined systolic (congestive) and diastolic (congestive) heart failure: Secondary | ICD-10-CM | POA: Diagnosis present

## 2022-11-24 DIAGNOSIS — I255 Ischemic cardiomyopathy: Secondary | ICD-10-CM | POA: Diagnosis present

## 2022-11-24 DIAGNOSIS — E8770 Fluid overload, unspecified: Secondary | ICD-10-CM | POA: Diagnosis not present

## 2022-11-24 DIAGNOSIS — I517 Cardiomegaly: Secondary | ICD-10-CM | POA: Diagnosis not present

## 2022-11-24 DIAGNOSIS — E871 Hypo-osmolality and hyponatremia: Secondary | ICD-10-CM | POA: Diagnosis present

## 2022-11-24 DIAGNOSIS — E785 Hyperlipidemia, unspecified: Secondary | ICD-10-CM | POA: Diagnosis present

## 2022-11-24 DIAGNOSIS — N170 Acute kidney failure with tubular necrosis: Secondary | ICD-10-CM | POA: Diagnosis not present

## 2022-11-24 DIAGNOSIS — I252 Old myocardial infarction: Secondary | ICD-10-CM

## 2022-11-24 DIAGNOSIS — J449 Chronic obstructive pulmonary disease, unspecified: Secondary | ICD-10-CM | POA: Diagnosis present

## 2022-11-24 DIAGNOSIS — Z882 Allergy status to sulfonamides status: Secondary | ICD-10-CM

## 2022-11-24 DIAGNOSIS — R5381 Other malaise: Secondary | ICD-10-CM | POA: Diagnosis present

## 2022-11-24 DIAGNOSIS — Z48813 Encounter for surgical aftercare following surgery on the respiratory system: Secondary | ICD-10-CM | POA: Diagnosis not present

## 2022-11-24 DIAGNOSIS — Z7984 Long term (current) use of oral hypoglycemic drugs: Secondary | ICD-10-CM

## 2022-11-24 DIAGNOSIS — Z1152 Encounter for screening for COVID-19: Secondary | ICD-10-CM | POA: Diagnosis not present

## 2022-11-24 DIAGNOSIS — E875 Hyperkalemia: Secondary | ICD-10-CM | POA: Diagnosis not present

## 2022-11-24 DIAGNOSIS — K7200 Acute and subacute hepatic failure without coma: Secondary | ICD-10-CM | POA: Diagnosis not present

## 2022-11-24 DIAGNOSIS — Z79899 Other long term (current) drug therapy: Secondary | ICD-10-CM

## 2022-11-24 DIAGNOSIS — G9341 Metabolic encephalopathy: Secondary | ICD-10-CM | POA: Diagnosis not present

## 2022-11-24 DIAGNOSIS — J984 Other disorders of lung: Secondary | ICD-10-CM | POA: Diagnosis not present

## 2022-11-24 DIAGNOSIS — I4891 Unspecified atrial fibrillation: Secondary | ICD-10-CM | POA: Diagnosis present

## 2022-11-24 DIAGNOSIS — K219 Gastro-esophageal reflux disease without esophagitis: Secondary | ICD-10-CM | POA: Diagnosis present

## 2022-11-24 DIAGNOSIS — Z8673 Personal history of transient ischemic attack (TIA), and cerebral infarction without residual deficits: Secondary | ICD-10-CM

## 2022-11-24 DIAGNOSIS — R34 Anuria and oliguria: Secondary | ICD-10-CM | POA: Diagnosis not present

## 2022-11-24 DIAGNOSIS — T508X5A Adverse effect of diagnostic agents, initial encounter: Secondary | ICD-10-CM | POA: Diagnosis not present

## 2022-11-24 DIAGNOSIS — E7800 Pure hypercholesterolemia, unspecified: Secondary | ICD-10-CM | POA: Diagnosis not present

## 2022-11-24 DIAGNOSIS — Z87891 Personal history of nicotine dependence: Secondary | ICD-10-CM

## 2022-11-24 DIAGNOSIS — K760 Fatty (change of) liver, not elsewhere classified: Secondary | ICD-10-CM | POA: Diagnosis present

## 2022-11-24 DIAGNOSIS — K567 Ileus, unspecified: Secondary | ICD-10-CM | POA: Diagnosis present

## 2022-11-24 DIAGNOSIS — N1411 Contrast-induced nephropathy: Secondary | ICD-10-CM | POA: Diagnosis not present

## 2022-11-24 DIAGNOSIS — Z95810 Presence of automatic (implantable) cardiac defibrillator: Secondary | ICD-10-CM | POA: Diagnosis not present

## 2022-11-24 DIAGNOSIS — J432 Centrilobular emphysema: Secondary | ICD-10-CM | POA: Diagnosis not present

## 2022-11-24 DIAGNOSIS — R042 Hemoptysis: Principal | ICD-10-CM | POA: Diagnosis present

## 2022-11-24 DIAGNOSIS — E861 Hypovolemia: Secondary | ICD-10-CM | POA: Diagnosis present

## 2022-11-24 DIAGNOSIS — E8729 Other acidosis: Secondary | ICD-10-CM | POA: Diagnosis present

## 2022-11-24 DIAGNOSIS — N400 Enlarged prostate without lower urinary tract symptoms: Secondary | ICD-10-CM | POA: Diagnosis present

## 2022-11-24 DIAGNOSIS — J84112 Idiopathic pulmonary fibrosis: Secondary | ICD-10-CM | POA: Diagnosis not present

## 2022-11-24 DIAGNOSIS — J9691 Respiratory failure, unspecified with hypoxia: Secondary | ICD-10-CM | POA: Diagnosis not present

## 2022-11-24 DIAGNOSIS — N179 Acute kidney failure, unspecified: Secondary | ICD-10-CM

## 2022-11-24 DIAGNOSIS — R918 Other nonspecific abnormal finding of lung field: Secondary | ICD-10-CM | POA: Diagnosis not present

## 2022-11-24 DIAGNOSIS — R0602 Shortness of breath: Secondary | ICD-10-CM | POA: Diagnosis not present

## 2022-11-24 DIAGNOSIS — Z7901 Long term (current) use of anticoagulants: Secondary | ICD-10-CM

## 2022-11-24 DIAGNOSIS — Z860101 Personal history of adenomatous and serrated colon polyps: Secondary | ICD-10-CM

## 2022-11-24 DIAGNOSIS — R590 Localized enlarged lymph nodes: Secondary | ICD-10-CM | POA: Diagnosis not present

## 2022-11-24 DIAGNOSIS — J811 Chronic pulmonary edema: Secondary | ICD-10-CM | POA: Diagnosis not present

## 2022-11-24 DIAGNOSIS — Z515 Encounter for palliative care: Secondary | ICD-10-CM | POA: Diagnosis not present

## 2022-11-24 DIAGNOSIS — Z8249 Family history of ischemic heart disease and other diseases of the circulatory system: Secondary | ICD-10-CM

## 2022-11-24 DIAGNOSIS — R579 Shock, unspecified: Secondary | ICD-10-CM | POA: Diagnosis not present

## 2022-11-24 DIAGNOSIS — Z881 Allergy status to other antibiotic agents status: Secondary | ICD-10-CM

## 2022-11-24 DIAGNOSIS — I5020 Unspecified systolic (congestive) heart failure: Secondary | ICD-10-CM | POA: Diagnosis not present

## 2022-11-24 DIAGNOSIS — R0609 Other forms of dyspnea: Secondary | ICD-10-CM | POA: Diagnosis not present

## 2022-11-24 DIAGNOSIS — T380X5A Adverse effect of glucocorticoids and synthetic analogues, initial encounter: Secondary | ICD-10-CM | POA: Diagnosis not present

## 2022-11-24 DIAGNOSIS — I5082 Biventricular heart failure: Secondary | ICD-10-CM | POA: Diagnosis present

## 2022-11-24 DIAGNOSIS — F419 Anxiety disorder, unspecified: Secondary | ICD-10-CM | POA: Diagnosis present

## 2022-11-24 DIAGNOSIS — R9389 Abnormal findings on diagnostic imaging of other specified body structures: Secondary | ICD-10-CM | POA: Diagnosis not present

## 2022-11-24 LAB — I-STAT ARTERIAL BLOOD GAS, ED
Acid-base deficit: 4 mmol/L — ABNORMAL HIGH (ref 0.0–2.0)
Bicarbonate: 19.9 mmol/L — ABNORMAL LOW (ref 20.0–28.0)
Calcium, Ion: 1.15 mmol/L (ref 1.15–1.40)
HCT: 45 % (ref 39.0–52.0)
Hemoglobin: 15.3 g/dL (ref 13.0–17.0)
O2 Saturation: 82 %
Potassium: 4.1 mmol/L (ref 3.5–5.1)
Sodium: 129 mmol/L — ABNORMAL LOW (ref 135–145)
TCO2: 21 mmol/L — ABNORMAL LOW (ref 22–32)
pCO2 arterial: 33.6 mm[Hg] (ref 32–48)
pH, Arterial: 7.381 (ref 7.35–7.45)
pO2, Arterial: 46 mm[Hg] — ABNORMAL LOW (ref 83–108)

## 2022-11-24 LAB — RESPIRATORY PANEL BY PCR

## 2022-11-24 LAB — PROTIME-INR
INR: 1.4 — ABNORMAL HIGH (ref 0.8–1.2)
Prothrombin Time: 17.6 s — ABNORMAL HIGH (ref 11.4–15.2)

## 2022-11-24 LAB — MRSA NEXT GEN BY PCR, NASAL: MRSA by PCR Next Gen: NOT DETECTED

## 2022-11-24 LAB — CBC
HCT: 43.9 % (ref 39.0–52.0)
Hemoglobin: 14.7 g/dL (ref 13.0–17.0)
MCH: 34.7 pg — ABNORMAL HIGH (ref 26.0–34.0)
MCHC: 33.5 g/dL (ref 30.0–36.0)
MCV: 103.5 fL — ABNORMAL HIGH (ref 80.0–100.0)
Platelets: 244 10*3/uL (ref 150–400)
RBC: 4.24 MIL/uL (ref 4.22–5.81)
RDW: 14.2 % (ref 11.5–15.5)
WBC: 11.6 10*3/uL — ABNORMAL HIGH (ref 4.0–10.5)
nRBC: 0 % (ref 0.0–0.2)

## 2022-11-24 LAB — TROPONIN I (HIGH SENSITIVITY)
Troponin I (High Sensitivity): 27 ng/L — ABNORMAL HIGH (ref <18)
Troponin I (High Sensitivity): 36 ng/L — ABNORMAL HIGH (ref <18)

## 2022-11-24 LAB — COMPREHENSIVE METABOLIC PANEL
ALT: 21 U/L (ref 0–44)
AST: 34 U/L (ref 15–41)
Albumin: 2.9 g/dL — ABNORMAL LOW (ref 3.5–5.0)
Alkaline Phosphatase: 55 U/L (ref 38–126)
Anion gap: 11 (ref 5–15)
BUN: 16 mg/dL (ref 8–23)
CO2: 21 mmol/L — ABNORMAL LOW (ref 22–32)
Calcium: 8.4 mg/dL — ABNORMAL LOW (ref 8.9–10.3)
Chloride: 99 mmol/L (ref 98–111)
Creatinine, Ser: 0.92 mg/dL (ref 0.61–1.24)
GFR, Estimated: >60 mL/min (ref >60)
Glucose, Bld: 150 mg/dL — ABNORMAL HIGH (ref 70–99)
Potassium: 4.8 mmol/L (ref 3.5–5.1)
Sodium: 131 mmol/L — ABNORMAL LOW (ref 135–145)
Total Bilirubin: 1.1 mg/dL (ref <1.2)
Total Protein: 6.3 g/dL — ABNORMAL LOW (ref 6.5–8.1)

## 2022-11-24 LAB — BRAIN NATRIURETIC PEPTIDE: B Natriuretic Peptide: 477.1 pg/mL — ABNORMAL HIGH (ref 0.0–100.0)

## 2022-11-24 LAB — RESP PANEL BY RT-PCR (RSV, FLU A&B, COVID)  RVPGX2
Influenza A by PCR: NEGATIVE
Influenza B by PCR: NEGATIVE
Resp Syncytial Virus by PCR: NEGATIVE
SARS Coronavirus 2 by RT PCR: NEGATIVE

## 2022-11-24 LAB — D-DIMER, QUANTITATIVE: D-Dimer, Quant: 1.23 ug{FEU}/mL — ABNORMAL HIGH (ref 0.00–0.50)

## 2022-11-24 LAB — SARS CORONAVIRUS 2 BY RT PCR: SARS Coronavirus 2 by RT PCR: NEGATIVE

## 2022-11-24 LAB — MAGNESIUM: Magnesium: 2 mg/dL (ref 1.7–2.4)

## 2022-11-24 MED ORDER — SACUBITRIL-VALSARTAN 24-26 MG PO TABS: 1.0000 | ORAL_TABLET | Freq: Two times a day (BID) | ORAL | Status: DC

## 2022-11-24 MED ORDER — CARVEDILOL 6.25 MG PO TABS
6.2500 mg | ORAL_TABLET | Freq: Every day | ORAL | Status: DC
  Administered 2022-11-24 – 2022-11-25 (×2): 6.25 mg via ORAL
  Filled 2022-11-24: qty 1
  Filled 2022-11-24: qty 2

## 2022-11-24 MED ORDER — MOMETASONE FURO-FORMOTEROL FUM 200-5 MCG/ACT IN AERO
2.0000 | INHALATION_SPRAY | Freq: Two times a day (BID) | RESPIRATORY_TRACT | Status: DC
  Administered 2022-11-24 – 2022-11-26 (×4): 2 via RESPIRATORY_TRACT
  Filled 2022-11-24: qty 8.8

## 2022-11-24 MED ORDER — ACETAMINOPHEN 325 MG PO TABS
650.0000 mg | ORAL_TABLET | Freq: Four times a day (QID) | ORAL | Status: DC | PRN
Start: 2022-11-24 — End: 2022-11-26

## 2022-11-24 MED ORDER — DEXTROSE 5 % IV SOLN
500.0000 mg | Freq: Once | INTRAVENOUS | Status: AC
  Administered 2022-11-24: 500 mg via INTRAVENOUS
  Filled 2022-11-24: qty 5

## 2022-11-24 MED ORDER — CARVEDILOL 3.125 MG PO TABS
3.1250 mg | ORAL_TABLET | Freq: Every day | ORAL | Status: DC
  Administered 2022-11-25: 3.125 mg via ORAL
  Filled 2022-11-24 (×2): qty 1

## 2022-11-24 MED ORDER — MEXILETINE HCL 150 MG PO CAPS
300.0000 mg | ORAL_CAPSULE | Freq: Two times a day (BID) | ORAL | Status: DC
  Administered 2022-11-24 – 2022-11-25 (×3): 300 mg via ORAL
  Filled 2022-11-24 (×4): qty 2

## 2022-11-24 MED ORDER — SODIUM CHLORIDE 0.9 % IV SOLN
2.0000 g | INTRAVENOUS | Status: DC
  Administered 2022-11-25 – 2022-11-29 (×5): 2 g via INTRAVENOUS
  Filled 2022-11-24 (×5): qty 20

## 2022-11-24 MED ORDER — LEVALBUTEROL HCL 0.63 MG/3ML IN NEBU: 0.6300 mg | INHALATION_SOLUTION | Freq: Four times a day (QID) | RESPIRATORY_TRACT | Status: DC | PRN

## 2022-11-24 MED ORDER — RANOLAZINE ER 500 MG PO TB12
500.0000 mg | ORAL_TABLET | Freq: Two times a day (BID) | ORAL | Status: DC
  Administered 2022-11-24 – 2022-11-25 (×3): 500 mg via ORAL
  Filled 2022-11-24 (×5): qty 1

## 2022-11-24 MED ORDER — IOHEXOL 350 MG/ML SOLN
75.0000 mL | Freq: Once | INTRAVENOUS | Status: AC | PRN
  Administered 2022-11-24: 75 mL via INTRAVENOUS

## 2022-11-24 MED ORDER — ORAL CARE MOUTH RINSE
15.0000 mL | OROMUCOSAL | Status: DC | PRN
Start: 2022-11-24 — End: 2022-11-26

## 2022-11-24 MED ORDER — SODIUM CHLORIDE 0.9% FLUSH
3.0000 mL | Freq: Two times a day (BID) | INTRAVENOUS | Status: DC
  Administered 2022-11-24 – 2022-11-30 (×12): 3 mL via INTRAVENOUS

## 2022-11-24 MED ORDER — DEXTROSE 5 % IV SOLN
500.0000 mg | INTRAVENOUS | Status: AC
  Administered 2022-11-25 – 2022-11-29 (×5): 500 mg via INTRAVENOUS
  Filled 2022-11-24 (×5): qty 5

## 2022-11-24 MED ORDER — FUROSEMIDE 20 MG PO TABS: 40.0000 mg | ORAL_TABLET | Freq: Every day | ORAL | Status: DC

## 2022-11-24 MED ORDER — ROSUVASTATIN CALCIUM 20 MG PO TABS
40.0000 mg | ORAL_TABLET | Freq: Every evening | ORAL | Status: DC
  Administered 2022-11-24 – 2022-11-25 (×2): 40 mg via ORAL
  Filled 2022-11-24 (×2): qty 2

## 2022-11-24 MED ORDER — POLYETHYLENE GLYCOL 3350 17 G PO PACK: 17.0000 g | PACK | Freq: Every day | ORAL | Status: DC | PRN

## 2022-11-24 MED ORDER — ACETAMINOPHEN 650 MG RE SUPP
650.0000 mg | Freq: Four times a day (QID) | RECTAL | Status: DC | PRN
Start: 2022-11-24 — End: 2022-11-26

## 2022-11-24 MED ORDER — PIRFENIDONE 267 MG PO TABS: 534.0000 mg | ORAL_TABLET | Freq: Three times a day (TID) | ORAL | Status: DC

## 2022-11-24 MED ORDER — UMECLIDINIUM BROMIDE 62.5 MCG/ACT IN AEPB
1.0000 | INHALATION_SPRAY | Freq: Every day | RESPIRATORY_TRACT | Status: DC
  Administered 2022-11-25 – 2022-11-26 (×2): 1 via RESPIRATORY_TRACT
  Filled 2022-11-24: qty 7

## 2022-11-24 MED ORDER — BUDESON-GLYCOPYRROL-FORMOTEROL 160-9-4.8 MCG/ACT IN AERO: 2.0000 | INHALATION_SPRAY | Freq: Two times a day (BID) | RESPIRATORY_TRACT | Status: DC

## 2022-11-24 MED ORDER — CHLORHEXIDINE GLUCONATE CLOTH 2 % EX PADS
6.0000 | MEDICATED_PAD | Freq: Every day | CUTANEOUS | Status: DC
  Administered 2022-11-24 – 2022-11-30 (×7): 6 via TOPICAL

## 2022-11-24 MED ORDER — SODIUM CHLORIDE 0.9 % IV SOLN
2.0000 g | Freq: Once | INTRAVENOUS | Status: AC
Start: 2022-11-24 — End: 2022-11-24
  Administered 2022-11-24: 2 g via INTRAVENOUS
  Filled 2022-11-24: qty 20

## 2022-11-24 MED ORDER — CARVEDILOL 3.125 MG PO TABS
3.1250 mg | ORAL_TABLET | Freq: Two times a day (BID) | ORAL | Status: DC
Start: 2022-11-24 — End: 2022-11-24

## 2022-11-24 MED ORDER — HYDROCODONE BIT-HOMATROP MBR 5-1.5 MG/5ML PO SOLN: 5.0000 mL | ORAL | Status: DC | PRN

## 2022-11-24 MED ORDER — POLYVINYL ALCOHOL 1.4 % OP SOLN: 1.0000 [drp] | OPHTHALMIC | Status: DC | PRN

## 2022-11-24 MED ORDER — SPIRONOLACTONE 12.5 MG HALF TABLET: 25.0000 mg | ORAL_TABLET | Freq: Every day | ORAL | Status: DC

## 2022-11-24 MED ORDER — RIVAROXABAN 10 MG PO TABS: 20.0000 mg | ORAL_TABLET | Freq: Every day | ORAL | Status: DC

## 2022-11-24 NOTE — Progress Notes (Signed)
Pt placed on 8L HFNC and is VSS, no indication of BiPAP at this time.

## 2022-11-24 NOTE — ED Notes (Signed)
Patients sats initially 78% on RA placed on 4L [place] and notified charge and room given.

## 2022-11-24 NOTE — ED Provider Notes (Signed)
[place] EMERGENCY DEPARTMENT AT [facility] Provider Note   CSN: [id number] Arrival date & time: 11/24/22  1035     History  Chief Complaint  Patient presents with   Hemoptysis    [name] is a 82 y.o. male.  82 year old male with a history of atrial fibrillation on Eliquis, pacemaker/AICD Conservation officer, historic buildings) CHF, CAD, pulmonary fibrosis, and COPD who presents to the emergency department hemoptysis.  Patient reports that for the past 1.5 months has been diagnosed with bronchitis twice.  Treated with keflex and steroids twice. Was feeling great this weekend and then on Tuesday acutely worsened.  Started having a cough, shortness of breath, and today noticed that he had blood in his sputum.  Also is having orthopnea.  No significant leg swelling.  Still making good urine output with his Lasix.  No fevers.  Not on home oxygen.       Home Medications Prior to Admission medications   Medication Sig Start Date End Date Taking? Authorizing Provider  antiseptic oral rinse (BIOTENE) LIQD 15 mLs by Mouth Rinse route in the morning and at bedtime.   Yes [provider]  BREZTRI AEROSPHERE 160-9-4.8 MCG/ACT AERO INHALE 2 PUFFS INTO THE LUNGS IN THE MORNING AND AT BEDTIME 10/16/22  Yes Ramaswamy, Carmin Muskrat, MD  carvedilol (COREG) 3.125 MG tablet TAKE 1 TABLET(3.125 MG) BY MOUTH IN THE AM AND TAKE 2 TABLETS (3.125 MG) TOTAL OF 6.25 MG BY MOUTH IN THE PM WITH A MEAL. 07/15/22  Yes Sheilah Pigeon, PA-C  cholecalciferol (VITAMIN D) 1000 units tablet Take 1,000 Units by mouth every evening.   Yes [provider]  ENTRESTO 24-26 MG TAKE 1 TABLET BY MOUTH TWICE DAILY 01/23/22  Yes Lewayne Bunting, MD  eplerenone (INSPRA) 25 MG tablet TAKE 1 TABLET(25 MG) BY MOUTH DAILY 12/19/21  Yes Lewayne Bunting, MD  furosemide (LASIX) 40 MG tablet Take 1 tablet (40 mg total) by mouth daily. 12/19/21  Yes Lewayne Bunting, MD  ipratropium (ATROVENT) 0.06 % nasal spray Place 2  sprays into both nostrils 3 (three) times daily. 08/26/20  Yes Byrum, Les Pou, MD  JARDIANCE 10 MG TABS tablet TAKE 1 TABLET(10 MG) BY MOUTH DAILY BEFORE BREAKFAST Patient taking differently: Take 10 mg by mouth daily. 04/24/22  Yes Lewayne Bunting, MD  levalbuterol [facility] HFA) 45 MCG/ACT inhaler INHALE 2 PUFFS INTO THE LUNGS EVERY 4 HOURS AS NEEDED FOR WHEEZING 11/02/22  Yes Parrett, Tammy S, NP  loratadine (CLARITIN) 10 MG tablet Take 10 mg by mouth in the morning.   Yes [provider]  Magnesium 250 MG TABS Take 250 mg by mouth every evening.   Yes [provider]  mexiletine (MEXITIL) 150 MG capsule TAKE 2 CAPSULES(300 MG) BY MOUTH TWICE DAILY Patient taking differently: Take 150 mg by mouth 2 (two) times daily. 09/17/22  Yes Lewayne Bunting, MD  Multiple Vitamin (MULTIVITAMIN WITH MINERALS) TABS Take 1 tablet by mouth every evening.   Yes [provider]  nitroGLYCERIN (NITROSTAT) 0.4 MG SL tablet DISSOLVE 1 TABLET UNDER THE TONGUE EVERY 5 MINUTES AS NEEDED FOR CHEST PAIN 06/20/21  Yes Lewayne Bunting, MD  Pirfenidone (ESBRIET) 267 MG TABS Take 2 tablets (534 mg total) by mouth with breakfast, with lunch, and with evening meal. 10/14/22  Yes Kalman Shan, MD  potassium chloride SA (KLOR-CON M) 20 MEQ tablet Take 1 tablet (20 mEq total) by mouth daily with lunch. 12/19/21  Yes Lewayne Bunting, MD  ranolazine (RANEXA) 500 MG 12 hr tablet Take 1 tablet (500 mg total) by mouth 2 (two) times daily. 08/16/22  Yes Duke Salvia, MD  rivaroxaban (XARELTO) 20 MG TABS tablet TAKE 1 TABLET(20 MG) BY MOUTH DAILY WITH SUPPER 12/19/21  Yes Lewayne Bunting, MD  rosuvastatin (CRESTOR) 40 MG tablet TAKE 1 TABLET(40 MG) BY MOUTH EVERY EVENING 08/27/22  Yes Crenshaw, Madolyn Frieze, MD  Spacer/Aero-Holding Chambers (AEROCHAMBER MV) inhaler Use as instructed 02/21/22  Yes Kalman Shan, MD  polyvinyl alcohol-povidone (HYPOTEARS) 1.4-0.6 % ophthalmic solution Place 1-2 drops into both  eyes in the morning and at bedtime.    [provider]      Allergies    Sulfa antibiotics and Doxycycline    Review of Systems   Review of Systems  Physical Exam Updated Vital Signs BP 114/66 (BP Location: Left Arm)   Pulse 65   Temp 98.4 F (36.9 C) (Oral)   Resp (!) 26   Ht 5\' 10"  (1.778 m)   Wt 83.9 kg   SpO2 94%   BMI 26.54 kg/m  Physical Exam Vitals and nursing note reviewed.  Constitutional:      General: He is not in acute distress.    Appearance: He is well-developed.     Comments: On 4 L nasal cannula  HENT:     Head: Normocephalic and atraumatic.     Right Ear: External ear normal.     Left Ear: External ear normal.     Nose: Nose normal.  Eyes:     Extraocular Movements: Extraocular movements intact.     Conjunctiva/sclera: Conjunctivae normal.     Pupils: Pupils are equal, round, and reactive to light.  Cardiovascular:     Rate and Rhythm: Normal rate and regular rhythm.     Heart sounds: Normal heart sounds.  Pulmonary:     Effort: Pulmonary effort is normal. No respiratory distress.     Breath sounds: Rales (Bibasilar) present.  Abdominal:     General: There is no distension.     Palpations: Abdomen is soft. There is no mass.     Tenderness: There is no abdominal tenderness. There is no guarding.  Musculoskeletal:     Cervical back: Normal range of motion and neck supple.     Right lower leg: No edema.     Left lower leg: No edema.  Skin:    General: Skin is warm and dry.  Neurological:     Mental Status: He is alert. Mental status is at baseline.  Psychiatric:        Mood and Affect: Mood normal.        Behavior: Behavior normal.     ED Results / Procedures / Treatments   Labs (all labs ordered are listed, but only abnormal results are displayed) Labs Reviewed  COMPREHENSIVE METABOLIC PANEL - Abnormal; Notable for the following components:      Result Value   Sodium 131 (*)    CO2 21 (*)    Glucose, Bld 150 (*)    Calcium  8.4 (*)    Total Protein 6.3 (*)    Albumin 2.9 (*)    All other components within normal limits  CBC - Abnormal; Notable for the following components:   WBC 11.6 (*)    MCV 103.5 (*)    MCH 34.7 (*)    All other components within normal limits  D-DIMER, QUANTITATIVE - Abnormal; Notable for the following components:   D-Dimer, Quant 1.23 (*)  All other components within normal limits  BRAIN NATRIURETIC PEPTIDE - Abnormal; Notable for the following components:   B Natriuretic Peptide 477.1 (*)    All other components within normal limits  PROTIME-INR - Abnormal; Notable for the following components:   Prothrombin Time 17.6 (*)    INR 1.4 (*)    All other components within normal limits  I-STAT ARTERIAL BLOOD GAS, ED - Abnormal; Notable for the following components:   pO2, Arterial 46 (*)    Bicarbonate 19.9 (*)    TCO2 21 (*)    Acid-base deficit 4.0 (*)    Sodium 129 (*)    All other components within normal limits  TROPONIN I (HIGH SENSITIVITY) - Abnormal; Notable for the following components:   Troponin I (High Sensitivity) 27 (*)    All other components within normal limits  TROPONIN I (HIGH SENSITIVITY) - Abnormal; Notable for the following components:   Troponin I (High Sensitivity) 36 (*)    All other components within normal limits  RESP PANEL BY RT-PCR (RSV, FLU A&B, COVID)  [id number]  SARS CORONAVIRUS 2 BY RT PCR  CULTURE, BLOOD (ROUTINE X 2)  CULTURE, BLOOD (ROUTINE X 2)  RESPIRATORY PANEL BY PCR  MRSA NEXT GEN BY PCR, NASAL  MAGNESIUM  LEGIONELLA PNEUMOPHILA SEROGP 1 UR AG  BLOOD GAS, ARTERIAL  COMPREHENSIVE METABOLIC PANEL  CBC    EKG None  Radiology CT Angio Chest PE W and/or Wo Contrast  Result Date: 11/24/2022 CLINICAL DATA:  Hemoptysis.  Shortness of breath EXAM: CT ANGIOGRAPHY CHEST WITH CONTRAST TECHNIQUE: Multidetector CT imaging of the chest was performed using the standard protocol during bolus administration of intravenous contrast. Multiplanar  CT image reconstructions and MIPs were obtained to evaluate the vascular anatomy. RADIATION DOSE REDUCTION: This exam was performed according to the departmental dose-optimization program which includes automated exposure control, adjustment of the mA and/or kV according to patient size and/or use of iterative reconstruction technique. CONTRAST:  75mL OMNIPAQUE IOHEXOL 350 MG/ML SOLN COMPARISON:  Plain films of earlier today. High-resolution chest CT 09/01/2022 FINDINGS: Cardiovascular: The quality of this exam for evaluation of pulmonary embolism is good. The bolus is well timed. No evidence of pulmonary embolism. Pacer. Upper normal ascending aortic caliber at 3.9 cm. Aortic atherosclerosis. Tortuous descending thoracic aorta. Moderate to marked cardiomegaly, without pericardial effusion. Left main, lad, right coronary artery calcification. Mediastinum/Nodes: Prominent mediastinal and bilateral hilar nodal tissue. Left suprahilar mildly enlarged node measures 10 mm on 52/5 versus 8 mm on the prior. Left hilar node measures 1.3 cm on 60/5 is relatively similar on the prior. Lungs/Pleura: No pleural fluid. Moderate right hemidiaphragm elevation. Development of diffuse, slightly upper lobe predominant ground-glass opacity and septal thickening. This is superimposed upon chronic interstitial lung disease and centrilobular/paraseptal emphysema. Upper Abdomen: Normal imaged portions of the liver, spleen, stomach. Musculoskeletal: No acute osseous abnormality. Review of the MIP images confirms the above findings. IMPRESSION: 1.  No evidence of pulmonary embolism. 2. Development of diffuse, slightly upper lobe predominant ground-glass opacity and septal thickening. Lack of significant pleural fluid argues against pulmonary edema. Favor pulmonary hemorrhage or atypical infection. 3. Cardiomegaly. Coronary artery atherosclerosis. Aortic Atherosclerosis (ICD10-I70.0). Emphysema (ICD10-J43.9). Interstitial lung disease,  poorly evaluated secondary to superimposed acute pulmonary findings. 4. Thoracic adenopathy which is most likely reactive. This could be re-evaluated with contrast enhanced chest CT at 3-6 months. Electronically Signed   By: Jeronimo Greaves M.D.   On: 11/24/2022 13:58   DG Chest [facility]  Result Date: 11/24/2022 CLINICAL DATA:  Shortness of breath. EXAM: PORTABLE CHEST 1 VIEW COMPARISON:  11/05/2022 FINDINGS: Stable pacer wires. Stable mild cardiac enlargement. Underlying chronic lung changes with superimposed perihilar pattern of pulmonary edema. Suspect bilateral pleural effusions. No pneumothorax. IMPRESSION: Underlying chronic lung changes with superimposed perihilar pattern of pulmonary edema. Electronically Signed   By: Rudie Meyer M.D.   On: 11/24/2022 12:13    Procedures Procedures    Medications Ordered in ED Medications  mexiletine (MEXITIL) capsule 300 mg (has no administration in time range)  ranolazine (RANEXA) 12 hr tablet 500 mg (has no administration in time range)  rosuvastatin (CRESTOR) tablet 40 mg (40 mg Oral Given 11/24/22 1710)  levalbuterol (XOPENEX) nebulizer solution 0.63 mg (has no administration in time range)  Pirfenidone TABS 534 mg (has no administration in time range)  polyvinyl alcohol-povidone (HYPOTEARS) ophthalmic solution 1-2 drop (has no administration in time range)  sodium chloride flush (NS) 0.9 % injection 3 mL (3 mLs Intravenous Given 11/24/22 1455)  acetaminophen (TYLENOL) tablet 650 mg (has no administration in time range)    Or  acetaminophen (TYLENOL) suppository 650 mg (has no administration in time range)  polyethylene glycol (MIRALAX / GLYCOLAX) packet 17 g (has no administration in time range)  HYDROcodone bit-homatropine (HYCODAN) 5-1.5 MG/5ML syrup 5 mL (has no administration in time range)  carvedilol (COREG) tablet 3.125 mg (has no administration in time range)  carvedilol (COREG) tablet 6.25 mg (6.25 mg Oral Given 11/24/22 1634)   cefTRIAXone (ROCEPHIN) 2 g in sodium chloride 0.9 % 100 mL IVPB (has no administration in time range)  azithromycin (ZITHROMAX) 500 mg in dextrose 5 % 250 mL IVPB (has no administration in time range)  Chlorhexidine Gluconate Cloth 2 % PADS 6 each (6 each Topical Given by Other 11/24/22 1850)  mometasone-formoterol (DULERA) 200-5 MCG/ACT inhaler 2 puff (has no administration in time range)    And  umeclidinium bromide (INCRUSE ELLIPTA) 62.5 MCG/ACT 1 puff (has no administration in time range)  iohexol (OMNIPAQUE) 350 MG/ML injection 75 mL (75 mLs Intravenous Contrast Given 11/24/22 1304)  cefTRIAXone (ROCEPHIN) 2 g in sodium chloride 0.9 % 100 mL IVPB (0 g Intravenous Stopped 11/24/22 1525)  azithromycin (ZITHROMAX) 500 mg in dextrose 5 % 250 mL IVPB (0 mg Intravenous Stopped 11/24/22 1622)    ED Course/ Medical Decision Making/ A&P Clinical Course as of 11/24/22 2001  Sun Nov 24, 2022  1411 Dr Katrinka Blazing from pulm to see and give recommendations [RP]    Clinical Course User Index [RP] Rondel Baton, MD                                 Medical Decision Making Amount and/or Complexity of Data Reviewed Labs: ordered. Radiology: ordered.  Risk Prescription drug management. Decision regarding hospitalization.   [name] is a 82 y.o. male with comorbidities that complicate the patient evaluation including atrial fibrillation on Eliquis, pacemaker/AICD Conservation officer, historic buildings) CHF, CAD, pulmonary fibrosis, and COPD who presents to the emergency department hemoptysis.   Initial Ddx:  Bronchitis, PE, COPD, heart failure, bronchoalveolar hemorrhage, pneumonia, MI, mass, URI  MDM/Course:  Patient resents emergency department with shortness of breath and hemoptysis.  Has an extensive cardiopulmonary history including CHF, pulmonary fibrosis, and COPD.  Has bibasilar rales but no wheezing.  No concerns for airway compromise at this time and has not had any significant hemoptysis in the ED.   Does  have a new 4 L oxygen requirement.  Had a chest x-ray which shows bilateral interstitial infiltrates which are concerning for IPF.  COVID and flu negative.  He underwent a CTA which does show pulmonary hemorrhage versus atypical infection more so than pulmonary edema.  Started on ceftriaxone and azithromycin.  Pulmonology consulted will see the patient shortly.  Admitted to medicine under Dr. Gaye Alken for further management.    This patient presents to the ED for concern of complaints listed in HPI, this involves an extensive number of treatment options, and is a complaint that carries with it a high risk of complications and morbidity. Disposition including potential need for admission considered.   Dispo: Admit to Floor  Additional history obtained from son Records reviewed Outpatient Clinic Notes The following labs were independently interpreted: Chemistry and show  mild hyponatremia I independently reviewed the following imaging with scope of interpretation limited to determining acute life threatening conditions related to emergency care: Chest x-ray and agree with the radiologist interpretation with the following exceptions: none I personally reviewed and interpreted cardiac monitoring: Paced rhythm I personally reviewed and interpreted the pt's EKG: see above for interpretation  I have reviewed the patients home medications and made adjustments as needed Consults: Critical care and Hospitalist Social Determinants of health:  Elderly  Portions of this note were generated with Scientist, clinical (histocompatibility and immunogenetics). Dictation errors may occur despite best attempts at proofreading.    CRITICAL CARE Performed by: Rondel Baton   Total critical care time: 30 minutes  Critical care time was exclusive of separately billable procedures and treating other patients.  Critical care was necessary to treat or prevent imminent or life-threatening deterioration.  Critical care was time spent  personally by me on the following activities: development of treatment plan with patient and/or surrogate as well as nursing, discussions with consultants, evaluation of patient's response to treatment, examination of patient, obtaining history from patient or surrogate, ordering and performing treatments and interventions, ordering and review of laboratory studies, ordering and review of radiographic studies, pulse oximetry and re-evaluation of patient's condition.   Final Clinical Impression(s) / ED Diagnoses Final diagnoses:  Hemoptysis  Hypoxia  Pulmonary hemorrhage  Atypical pneumonia    Rx / DC Orders ED Discharge Orders     None         Rondel Baton, MD 11/24/22 2001

## 2022-11-24 NOTE — ED Notes (Signed)
Patient transported to CT 

## 2022-11-24 NOTE — ED Triage Notes (Signed)
Patient has had hemoptysis for awhile but now it is has got worse and having shortness of breath and coughing with talking.  Patient reports bronchitis since August but not getting any\ better. Patient brought samples to show the MD> Patient is on xarelto.

## 2022-11-24 NOTE — Consult Note (Signed)
NAME:  [name], MRN:  [id number], DOB:  [birth year], LOS: 0 ADMISSION DATE:  11/24/2022, CONSULTATION DATE:  11/24/2022  REFERRING MD:  Dr. Alinda Money, CHIEF COMPLAINT:  hemoptysis    History of Present Illness:  Per Dr. Jane Canary recent office note. [name] 82 y.o. -he is here for an acute visit.  This because of hemoptysis.  He tells me that late in summer 2020 for his approximately September 2024 he had hemoptysis.  He was given Keflex and it went away but then it came back.  Nurse practitioner gave steroids it went away but now it is back since October 29, 2022.  Its present only 1 time early in the morning when he brings out his sputum.  He feels like the something behind his sternum that he has to cough out is there with mucus.  He is on Xarelto.  There is no change in the shortness of breath there is no fever.  He was recently given Keflex again but it does not help.  He brought in the tissues of his hemoptysis and plastic packet for me to see this.  He is really concerned about this.  He is wondering about repeat steroid course.  He is wondering about repeat antibiotic course.  He is also wondering about the etiology.   Patient was treated with doxycycline and steroids.  Doxycycline made him sick to his stomach and he could not take it.  He switched to Keflex.  He is still on Xarelto for A-fib.  He has had scant sputum production but his O2 sats were low so the decision today was come to the emergency room because he had O2 sats in the 70s.  He is now satting in the low 90s on 4 L.  This is a new oxygen requirement for him.  Pertinent  Medical History   Past Medical History:  Diagnosis Date   Adenomatous colon polyp 2009; 06/2012   Colonoscopy 07/2007---Dr. Maryjane [facility]--Repeat 06/2012 showed one tubular adenoma w/out high grade dysplasia.   AICD (automatic cardioverter/defibrillator) present    Environmental manager   Atrial fibrillation (HCC)    Bilateral sensorineural hearing loss     BPH (benign prostatic hypertrophy)    PSAs ok (followed by urologist): no LUTS   CAD (coronary artery disease)    PTCA RCA 1997 (Inf/post MI);   CHF (congestive heart failure) (HCC)    Chronic constipation    Chronic rhinitis    ?vasomotor (Dr. Lazarus Salines)   COPD (chronic obstructive pulmonary disease) (HCC)    Diverticulosis of colon 2005   Noted on Colonoscopies in 2005 and 2009 and on CT 2010   Gallstones 06/2014   No evidence of cholecystitis on abd u/s 06/2014   GERD (gastroesophageal reflux disease)    Hepatic steatosis 2010; 2016   Noted on CT abd 2010 and on u/s abd 04/2009 (transaminasemia) and u/s 06/2014.   Hyperlipidemia    Hypertension    Ischemic cardiomyopathy    Microscopic hematuria    Cysto neg 02/2009 except for BPH-likely has microhem from his renal stone dz   Nephrolithiasis    No distal stones on CT 2010; stable 8 mm RLP stone, 3mm LMP stone 10/2013 plain film.  Abd u/s 06/2014: 9 mm nonobstructing lower pole right renal stone.  03/2015 urol: stable nonobstructing renal calc by CT 12/2014 and KUB 03/2015.   Obstructive sleep apnea    no cpap uses since weight loss of 50 pounds   Presence of  permanent cardiac pacemaker    [employer]   Squamous cell carcinoma of back    Ventricular tachycardia (HCC)      Significant Hospital Events: Including procedures, antibiotic start and stop dates in addition to other pertinent events     Interim History / Subjective:  Per HPI above  Objective   Blood pressure 100/60, pulse 62, temperature 97.7 F (36.5 C), resp. rate (!) 23, height 5\' 10"  (1.778 m), weight 83.9 kg, SpO2 94%.       No intake or output data in the 24 hours ending 11/24/22 1443 Filed Weights   11/24/22 1039  Weight: 83.9 kg    Examination: General: Elderly gentleman resting comfortably in bed, no acute distress HENT: NCAT tracking appropriately Lungs: Clear to auscultation bilaterally with no wheeze does have inspiratory crackles in the  bilateral bases Cardiovascular: Regular rate rhythm S1-S2 Abdomen: Soft nontender nondistended Extremities: No significant edema Neuro: Alert oriented following commands no focal deficit, AAOx3 GU: Deferred  Resolved Hospital Problem list     Assessment & Plan:   IPF Acute exacerbation of interstitial lung disease Hemoptysis CT with bilateral groundglass infiltrates, possible hemorrhage Anticoagulated on Xarelto with A-fib. Peripheral blood eosinophilia Plan:  Patient's hemoptysis is mild and I suspect related to the inflammation going on in his lungs. Will try to not stop his Xarelto at this time I think it is okay to continue. Continue his triple therapy inhaler regimen and Agree with current antibiotic regimen, ceftriaxone plus azithromycin Complete course of antibiotics. Cough suppression If hemoptysis gets worse could always consider stopping Xarelto and/or nebulized TXA. At this time we can continue to observe.  Hemoglobin remained stable. Hold off on starting antibiotics N.p.o. midnight for bronchoscopy with BAL tomorrow.    Labs   CBC: Recent Labs  Lab 11/24/22 1120  WBC 11.6*  HGB 14.7  HCT 43.9  MCV 103.5*  PLT 244    Basic Metabolic Panel: Recent Labs  Lab 11/24/22 1120  NA 131*  K 4.8  CL 99  CO2 21*  GLUCOSE 150*  BUN 16  CREATININE 0.92  CALCIUM 8.4*   GFR: Estimated Creatinine Clearance: 65 mL/min (by C-G formula based on SCr of 0.92 mg/dL). Recent Labs  Lab 11/24/22 1120  WBC 11.6*    Liver Function Tests: Recent Labs  Lab 11/24/22 1120  AST 34  ALT 21  ALKPHOS 55  BILITOT 1.1  PROT 6.3*  ALBUMIN 2.9*   No results for input(s): "LIPASE", "AMYLASE" in the last 168 hours. No results for input(s): "AMMONIA" in the last 168 hours.  ABG    Component Value Date/Time   PHART 7.440 12/10/2009 1009   PCO2ART 30.9 (L) 12/10/2009 1009   PO2ART 98.0 12/10/2009 1009   HCO3 21.0 12/10/2009 1009   TCO2 22 12/10/2009 1009    ACIDBASEDEF 2.0 12/10/2009 1009   O2SAT 98.0 12/10/2009 1009     Coagulation Profile: No results for input(s): "INR", "PROTIME" in the last 168 hours.  Cardiac Enzymes: No results for input(s): "CKTOTAL", "CKMB", "CKMBINDEX", "TROPONINI" in the last 168 hours.  HbA1C: Hgb A1c MFr Bld  Date/Time Value Ref Range Status  11/17/2020 10:06 AM 6.0 4.6 - 6.5 % Final    Comment:    Glycemic Control Guidelines for People with Diabetes:Non Diabetic:  <6%Goal of Therapy: <7%Additional Action Suggested:  >8%   05/18/2019 09:25 AM 5.9 4.6 - 6.5 % Final    Comment:    Glycemic Control Guidelines for People with Diabetes:Non Diabetic:  <6%Goal  of Therapy: <7%Additional Action Suggested:  >8%     CBG: No results for input(s): "GLUCAP" in the last 168 hours.  Review of Systems:   Review of Systems  Constitutional:  Negative for chills, fever, malaise/fatigue and weight loss.  HENT:  Negative for hearing loss, sore throat and tinnitus.   Eyes:  Negative for blurred vision and double vision.  Respiratory:  Positive for cough, hemoptysis and shortness of breath. Negative for sputum production, wheezing and stridor.   Cardiovascular:  Negative for chest pain, palpitations, orthopnea, leg swelling and PND.  Gastrointestinal:  Negative for abdominal pain, constipation, diarrhea, heartburn, nausea and vomiting.  Genitourinary:  Negative for dysuria, hematuria and urgency.  Musculoskeletal:  Negative for joint pain and myalgias.  Skin:  Negative for itching and rash.  Neurological:  Negative for dizziness, tingling, weakness and headaches.  Endo/Heme/Allergies:  Negative for environmental allergies. Does not bruise/bleed easily.  Psychiatric/Behavioral:  Negative for depression. The patient is not nervous/anxious and does not have insomnia.   All other systems reviewed and are negative.    Past Medical History:  He,  has a past medical history of Adenomatous colon polyp (2009; 06/2012), AICD  (automatic cardioverter/defibrillator) present, Atrial fibrillation (HCC), Bilateral sensorineural hearing loss, BPH (benign prostatic hypertrophy), CAD (coronary artery disease), CHF (congestive heart failure) (HCC), Chronic constipation, Chronic rhinitis, COPD (chronic obstructive pulmonary disease) (HCC), Diverticulosis of colon (2005), Gallstones (06/2014), GERD (gastroesophageal reflux disease), Hepatic steatosis (2010; 2016), Hyperlipidemia, Hypertension, Ischemic cardiomyopathy, Microscopic hematuria, Nephrolithiasis, Obstructive sleep apnea, Presence of permanent cardiac pacemaker, Squamous cell carcinoma of back, and Ventricular tachycardia (HCC).   Surgical History:   Past Surgical History:  Procedure Laterality Date   ATRIAL ABLATION SURGERY     flutter ablation 2002   BREAST SURGERY Left 1990   "Tissue removed from breast"   CARDIAC CATHETERIZATION     CARDIOVERSION N/A 05/10/2020   Procedure: CARDIOVERSION;  Surgeon: Thurmon Fair, MD;  Location: [facility];  Service: Cardiovascular;  Laterality: N/A;   carotid doppler  09/25/12   No signif stenosis   COLONOSCOPY N/A 07/13/2012   Diverticula ascending colon.  Polyp x 1-recall 5 yrs (Dr. Ewing Schlein) Procedure: COLONOSCOPY;  Surgeon: Petra Kuba, MD;  Location: [facility] ENDOSCOPY;  Service: Endoscopy;  Laterality: N/A; polypectomy x 1    COLONOSCOPY WITH PROPOFOL N/A 08/18/2015   Procedure: COLONOSCOPY WITH PROPOFOL;  Surgeon: Napoleon Form, MD;  Location: [facility] ENDOSCOPY;  Service: Endoscopy;  Laterality: N/A;   ESOPHAGOGASTRODUODENOSCOPY N/A 07/13/2012   Procedure: ESOPHAGOGASTRODUODENOSCOPY (EGD);  Surgeon: Petra Kuba, MD;  Location: [facility];  Service: Endoscopy;  Laterality: N/A;  NORMAL   EXTRACORPOREAL SHOCK WAVE LITHOTRIPSY     HEMORRHOID BANDING     ICD     a) Guidant Contak H170- Sherryl Manges, MD 07/25/06 (second device) b) Medtronic CRT-D   ICD GENERATOR CHANGEOUT N/A 02/14/2020   Procedure: ICD GENERATOR CHANGEOUT;  Surgeon:  Duke Salvia, MD;  Location: [facility] INVASIVE CV LAB;  Service: Cardiovascular;  Laterality: N/A;   INGUINAL HERNIA REPAIR Bilateral 1975   LEAD REVISION Bilateral 12/26/2011   Procedure: LEAD REVISION;  Surgeon: Marinus Maw, MD;  Location: [facility] CATH LAB;  Service: Cardiovascular;  Laterality: Bilateral;   PFTs  02/2015   Mod restriction (interstitial), mod diffusion defect (Dr. Maple Hudson)   precancer area keratsis removed  05/2015    leftf orehead and left arm and right arm and back     Social History:   reports that he quit smoking about  42 years ago. His smoking use included cigarettes. He started smoking about 57 years ago. He has a 30 pack-year smoking history. He has never used smokeless tobacco. He reports that he does not drink alcohol and does not use drugs.   Family History:  His family history includes Breast cancer in his maternal grandmother; Coronary artery disease in his father; Gallstones in his mother; Goiter in his paternal grandmother; Heart attack in his father; Heart disease in his mother; Hypertension in his sister; Nephrolithiasis in his father and son; Other in his mother. There is no history of Colon cancer.   Allergies Allergies  Allergen Reactions   Sulfa Antibiotics Rash   Doxycycline Nausea And Vomiting     Home Medications  Prior to Admission medications   Medication Sig Start Date End Date Taking? Authorizing Provider  antiseptic oral rinse (BIOTENE) LIQD 15 mLs by Mouth Rinse route in the morning and at bedtime.    [provider]  BREZTRI AEROSPHERE 160-9-4.8 MCG/ACT AERO INHALE 2 PUFFS INTO THE LUNGS IN THE MORNING AND AT BEDTIME 10/16/22   Kalman Shan, MD  carvedilol (COREG) 3.125 MG tablet TAKE 1 TABLET(3.125 MG) BY MOUTH IN THE AM AND TAKE 2 TABLETS (3.125 MG) TOTAL OF 6.25 MG BY MOUTH IN THE PM WITH A MEAL. 07/15/22   Sheilah Pigeon, PA-C  cephALEXin (KEFLEX) 500 MG capsule Take 1 capsule (500 mg total) by mouth 3 (three) times daily.  11/06/22   Kalman Shan, MD  cholecalciferol (VITAMIN D) 1000 units tablet Take 1,000 Units by mouth every evening.    [provider]  ciclopirox (PENLAC) 8 % solution APPLY OVER NAIL AND SURROUNDING SKIN EVERY NIGHT AT BEDTIME. APPLY OVER PREVIOUSE COATS FOR 7 DAYS THEN REMOVE WITH ALCOHOL AND CONTINUE Patient not taking: Reported on 11/05/2022 07/02/22   Sheliah Hatch, MD  clotrimazole [facility]) 10 MG troche Dissolve, swish and swallow one tablet by mouth 5 times daily for 14 days. 11/08/21   Kalman Shan, MD  ENTRESTO 24-26 MG TAKE 1 TABLET BY MOUTH TWICE DAILY 01/23/22   Lewayne Bunting, MD  eplerenone (INSPRA) 25 MG tablet TAKE 1 TABLET(25 MG) BY MOUTH DAILY 12/19/21   Lewayne Bunting, MD  furosemide (LASIX) 40 MG tablet Take 1 tablet (40 mg total) by mouth daily. 12/19/21   Lewayne Bunting, MD  ipratropium (ATROVENT) 0.06 % nasal spray Place 2 sprays into both nostrils 3 (three) times daily. 08/26/20   Leslye Peer, MD  JARDIANCE 10 MG TABS tablet TAKE 1 TABLET(10 MG) BY MOUTH DAILY BEFORE BREAKFAST 04/24/22   Lewayne Bunting, MD  levalbuterol [facility]) 45 MCG/ACT inhaler INHALE 2 PUFFS INTO THE LUNGS EVERY 4 HOURS AS NEEDED FOR WHEEZING 11/02/22   Parrett, Virgel Bouquet, NP  loratadine (CLARITIN) 10 MG tablet Take 10 mg by mouth in the morning.    [provider]  Magnesium 250 MG TABS Take 250 mg by mouth every evening.    [provider]  mexiletine (MEXITIL) 150 MG capsule TAKE 2 CAPSULES(300 MG) BY MOUTH TWICE DAILY 09/17/22   Lewayne Bunting, MD  Multiple Vitamin (MULTIVITAMIN WITH MINERALS) TABS Take 1 tablet by mouth every evening.    [provider]  nitroGLYCERIN (NITROSTAT) 0.4 MG SL tablet DISSOLVE 1 TABLET UNDER THE TONGUE EVERY 5 MINUTES AS NEEDED FOR CHEST PAIN 06/20/21   Lewayne Bunting, MD  Olopatadine HCl (PATADAY OP) Apply 1 drop to eye daily.    [provider]  Pirfenidone (ESBRIET) 267 MG TABS Take 2 tablets  (534 mg total) by mouth with breakfast, with lunch, and with evening meal. 10/14/22   Kalman Shan, MD  polyvinyl alcohol-povidone (HYPOTEARS) 1.4-0.6 % ophthalmic solution Place 1-2 drops into both eyes in the morning and at bedtime.    [provider]  potassium chloride SA (KLOR-CON M) 20 MEQ tablet Take 1 tablet (20 mEq total) by mouth daily with lunch. 12/19/21   Lewayne Bunting, MD  predniSONE (DELTASONE) 10 MG tablet Take 4 tabs x 3 days, 2 tabs x 3 days, 1 x 3 days, 1/2 x 3 days and stop 11/05/22   Kalman Shan, MD  ranolazine (RANEXA) 500 MG 12 hr tablet Take 1 tablet (500 mg total) by mouth 2 (two) times daily. 08/16/22   Duke Salvia, MD  rivaroxaban (XARELTO) 20 MG TABS tablet TAKE 1 TABLET(20 MG) BY MOUTH DAILY WITH SUPPER 12/19/21   Lewayne Bunting, MD  rosuvastatin (CRESTOR) 40 MG tablet TAKE 1 TABLET(40 MG) BY MOUTH EVERY EVENING 08/27/22   Lewayne Bunting, MD  Spacer/Aero-Holding Chambers (AEROCHAMBER MV) inhaler Use as instructed 02/21/22   Kalman Shan, MD  Tiotropium Bromide Monohydrate (SPIRIVA RESPIMAT) 1.25 MCG/ACT AERS Inhale 2 puffs into the lungs daily. Patient not taking: Reported on 11/05/2022 07/16/22   Kalman Shan, MD     Josephine Igo, DO [facility] 11/24/2022 2:45 PM

## 2022-11-24 NOTE — Progress Notes (Signed)
eLink Physician-Brief Progress Note Patient Name: [name] DOB: 25-Jun-1940 MRN: [id number]   Date of Service  11/24/2022  HPI/Events of Note  82 year old male with a history of COPD and IPF admitted to the floor with hemoptysis.  He developed a new oxygen requirement today initially at 4 L/min but subsequently went up to Optiflow.  Evaluated by Dr. Tonia Brooms and transferred to ICU.  eICU Interventions  Patient chart reviewed.  Pertinent lab and imaging studies reviewed.  2-week A/V interaction/assessment done. He has not had any hemoptysis in about an hour.  He feels he is breathing much better on Optiflow.  He is able to talk in complete sentences and does not appear labored. Impression: Acute hypoxemic respiratory failure Hemoptysis in a patient on anticoagulation for A-fib History of COPD, not in acute exacerbation  Anticoagulation is on hold. Continue IV antibiotics. For bronchoscopy with BAL in the morning. Continue to monitor closely in the ICU.      Intervention Category Evaluation Type: New Patient Evaluation  Carilyn Goodpasture 11/24/2022, 7:25 PM

## 2022-11-24 NOTE — H&P (Addendum)
History and Physical   [name] UJW:[id number] DOB: May 25, 1940 DOA: 11/24/2022  PCP: Sheliah Hatch, MD   Patient coming from: Home  Chief Complaint: Hemoptysis  HPI: [name] is a 82 y.o. male with medical history significant of hypertension, hyperlipidemia, GERD, MCI, OSA, TIA, CAD, A-fib, CHF, IPF, COPD, V. tach presenting with ongoing hemoptysis.  Patient has known COPD and IPF.  His pulmonary status have been worsening on chronic amiodarone for history of V. tach.  Amiodarone had been discontinued.  He follows with pulmonology and last saw them on 10/22 for ongoing hemoptysis.  X-ray obtained that looked okay but patient was started on doxycycline and prednisone.  Had nausea vomiting with doxycycline so this was switched to Keflex.  He completed Keflex and prednisone with improvement in his symptoms.  Labs checked by pulmonology noted some elevated eos and BNP so allergy testing and echo have been planned.  Patient contacted pulmonology to let them know that he had began to notice brown sputum again though not bloody on 11/4.  Patient states he now has had return of bloody sputum and was told that pulmonology would need a sample for further evaluation/recommendations.  He has continued to take Xarelto.  He does have history of multiple episodes of bronchitis.  Also reporting some increasing shortness of breath recently.  Denies fevers, chills, chest pain, abdominal pain, constipation, diarrhea.  ED Course: Vital signs in the ED notable for blood pressure in the 90s to 100 systolic, respiratory rate in the 20s.  Lab workup included CMP with sodium 131, bicarb 20, glucose 150, calcium 8.4, protein C 0.3, albumin 2.9.  CBC showed leukocytosis to 11.6.  D-dimer mildly elevated 1.23.  BNP elevated to 477.  Troponin mildly elevated to 27 with repeat pending.  Respiratory panel for flu COVID and RSV negative.  Blood cultures pending.  Chest x-ray showed pulmonary edema on chronic  changes.  CTA PE study showed no evidence of PE.  Did show diffuse groundglass opacities with septal thickening favoring atypical infection versus pulmonary hemorrhage and not edema.  Also noted was cardiomegaly and CAD.  Patient received ceftriaxone azithromycin in the ED.  Pulmonology consulted due to concern for pulmonary hemorrhage and recurrent hemoptysis is stated they will see the patient in consultation.  Review of Systems: As per HPI otherwise all other systems reviewed and are negative.  Past Medical History:  Diagnosis Date   Adenomatous colon polyp 2009; 06/2012   Colonoscopy 07/2007---Dr. Maryjane Hurter [facility]--Repeat 06/2012 showed one tubular adenoma w/out high grade dysplasia.   AICD (automatic cardioverter/defibrillator) present    Environmental manager   Atrial fibrillation (HCC)    Bilateral sensorineural hearing loss    BPH (benign prostatic hypertrophy)    PSAs ok (followed by urologist): no LUTS   CAD (coronary artery disease)    PTCA RCA 1997 (Inf/post MI);   CHF (congestive heart failure) (HCC)    Chronic constipation    Chronic rhinitis    ?vasomotor (Dr. Lazarus Salines)   COPD (chronic obstructive pulmonary disease) (HCC)    Diverticulosis of colon 2005   Noted on Colonoscopies in 2005 and 2009 and on CT 2010   Gallstones 06/2014   No evidence of cholecystitis on abd u/s 06/2014   GERD (gastroesophageal reflux disease)    Hepatic steatosis 2010; 2016   Noted on CT abd 2010 and on u/s abd 04/2009 (transaminasemia) and u/s 06/2014.   Hyperlipidemia    Hypertension    Ischemic cardiomyopathy  Microscopic hematuria    Cysto neg 02/2009 except for BPH-likely has microhem from his renal stone dz   Nephrolithiasis    No distal stones on CT 2010; stable 8 mm RLP stone, 3mm LMP stone 10/2013 plain film.  Abd u/s 06/2014: 9 mm nonobstructing lower pole right renal stone.  03/2015 urol: stable nonobstructing renal calc by CT 12/2014 and KUB 03/2015.   Obstructive sleep apnea    no cpap uses  since weight loss of 50 pounds   Presence of permanent cardiac pacemaker    [employer]   Squamous cell carcinoma of back    Ventricular tachycardia [facility])     Past Surgical History:  Procedure Laterality Date   ATRIAL ABLATION SURGERY     flutter ablation 2002   BREAST SURGERY Left 1990   "Tissue removed from breast"   CARDIAC CATHETERIZATION     CARDIOVERSION N/A 05/10/2020   Procedure: CARDIOVERSION;  Surgeon: Thurmon Fair, MD;  Location: [facility] ENDOSCOPY;  Service: Cardiovascular;  Laterality: N/A;   carotid doppler  09/25/12   No signif stenosis   COLONOSCOPY N/A 07/13/2012   Diverticula ascending colon.  Polyp x 1-recall 5 yrs (Dr. Ewing Schlein) Procedure: COLONOSCOPY;  Surgeon: Petra Kuba, MD;  Location: [facility] ENDOSCOPY;  Service: Endoscopy;  Laterality: N/A; polypectomy x 1    COLONOSCOPY WITH PROPOFOL N/A 08/18/2015   Procedure: COLONOSCOPY WITH PROPOFOL;  Surgeon: Napoleon Form, MD;  Location: [facility] ENDOSCOPY;  Service: Endoscopy;  Laterality: N/A;   ESOPHAGOGASTRODUODENOSCOPY N/A 07/13/2012   Procedure: ESOPHAGOGASTRODUODENOSCOPY (EGD);  Surgeon: Petra Kuba, MD;  Location: [facility];  Service: Endoscopy;  Laterality: N/A;  NORMAL   EXTRACORPOREAL SHOCK WAVE LITHOTRIPSY     HEMORRHOID BANDING     ICD     a) Guidant Contak H170- Sherryl Manges, MD 07/25/06 (second device) b) Medtronic CRT-D   ICD GENERATOR CHANGEOUT N/A 02/14/2020   Procedure: ICD GENERATOR CHANGEOUT;  Surgeon: Duke Salvia, MD;  Location: [facility] INVASIVE CV LAB;  Service: Cardiovascular;  Laterality: N/A;   INGUINAL HERNIA REPAIR Bilateral 1975   LEAD REVISION Bilateral 12/26/2011   Procedure: LEAD REVISION;  Surgeon: Marinus Maw, MD;  Location: [facility] CATH LAB;  Service: Cardiovascular;  Laterality: Bilateral;   PFTs  02/2015   Mod restriction (interstitial), mod diffusion defect (Dr. Maple Hudson)   precancer area keratsis removed  05/2015    leftf orehead and left arm and right arm and back    Social History   reports that he quit smoking about 42 years ago. His smoking use included cigarettes. He started smoking about 57 years ago. He has a 30 pack-year smoking history. He has never used smokeless tobacco. He reports that he does not drink alcohol and does not use drugs.  Allergies  Allergen Reactions   Sulfa Antibiotics Rash   Doxycycline Nausea And Vomiting    Family History  Problem Relation Age of Onset   Heart disease Mother    Other Mother        colon surgery for fistula   Gallstones Mother    Breast cancer Maternal Grandmother    Coronary artery disease Father        family hx of   Heart attack Father        4 stents/ pacemaker   Nephrolithiasis Father    Hypertension Sister    Goiter Paternal Grandmother    Nephrolithiasis Son    Colon cancer Neg Hx   Reviewed on admission  Prior to Admission medications  Medication Sig Start Date End Date Taking? Authorizing Provider  antiseptic oral rinse (BIOTENE) LIQD 15 mLs by Mouth Rinse route in the morning and at bedtime.    [provider]  BREZTRI AEROSPHERE 160-9-4.8 MCG/ACT AERO INHALE 2 PUFFS INTO THE LUNGS IN THE MORNING AND AT BEDTIME 10/16/22   Kalman Shan, MD  carvedilol (COREG) 3.125 MG tablet TAKE 1 TABLET(3.125 MG) BY MOUTH IN THE AM AND TAKE 2 TABLETS (3.125 MG) TOTAL OF 6.25 MG BY MOUTH IN THE PM WITH A MEAL. 07/15/22   Sheilah Pigeon, PA-C  cephALEXin (KEFLEX) 500 MG capsule Take 1 capsule (500 mg total) by mouth 3 (three) times daily. 11/06/22   Kalman Shan, MD  cholecalciferol (VITAMIN D) 1000 units tablet Take 1,000 Units by mouth every evening.    [provider]  ciclopirox (PENLAC) 8 % solution APPLY OVER NAIL AND SURROUNDING SKIN EVERY NIGHT AT BEDTIME. APPLY OVER PREVIOUSE COATS FOR 7 DAYS THEN REMOVE WITH ALCOHOL AND CONTINUE Patient not taking: Reported on 11/05/2022 07/02/22   Sheliah Hatch, MD  clotrimazole [facility]) 10 MG troche Dissolve, swish and swallow one tablet by  mouth 5 times daily for 14 days. 11/08/21   Kalman Shan, MD  ENTRESTO 24-26 MG TAKE 1 TABLET BY MOUTH TWICE DAILY 01/23/22   Lewayne Bunting, MD  eplerenone (INSPRA) 25 MG tablet TAKE 1 TABLET(25 MG) BY MOUTH DAILY 12/19/21   Lewayne Bunting, MD  furosemide (LASIX) 40 MG tablet Take 1 tablet (40 mg total) by mouth daily. 12/19/21   Lewayne Bunting, MD  ipratropium (ATROVENT) 0.06 % nasal spray Place 2 sprays into both nostrils 3 (three) times daily. 08/26/20   Leslye Peer, MD  JARDIANCE 10 MG TABS tablet TAKE 1 TABLET(10 MG) BY MOUTH DAILY BEFORE BREAKFAST 04/24/22   Lewayne Bunting, MD  levalbuterol [facility]) 45 MCG/ACT inhaler INHALE 2 PUFFS INTO THE LUNGS EVERY 4 HOURS AS NEEDED FOR WHEEZING 11/02/22   Parrett, Virgel Bouquet, NP  loratadine (CLARITIN) 10 MG tablet Take 10 mg by mouth in the morning.    [provider]  Magnesium 250 MG TABS Take 250 mg by mouth every evening.    [provider]  mexiletine (MEXITIL) 150 MG capsule TAKE 2 CAPSULES(300 MG) BY MOUTH TWICE DAILY 09/17/22   Lewayne Bunting, MD  Multiple Vitamin (MULTIVITAMIN WITH MINERALS) TABS Take 1 tablet by mouth every evening.    [provider]  nitroGLYCERIN (NITROSTAT) 0.4 MG SL tablet DISSOLVE 1 TABLET UNDER THE TONGUE EVERY 5 MINUTES AS NEEDED FOR CHEST PAIN 06/20/21   Lewayne Bunting, MD  Olopatadine HCl (PATADAY OP) Apply 1 drop to eye daily.    [provider]  Pirfenidone (ESBRIET) 267 MG TABS Take 2 tablets (534 mg total) by mouth with breakfast, with lunch, and with evening meal. 10/14/22   Kalman Shan, MD  polyvinyl alcohol-povidone (HYPOTEARS) 1.4-0.6 % ophthalmic solution Place 1-2 drops into both eyes in the morning and at bedtime.    [provider]  potassium chloride SA (KLOR-CON M) 20 MEQ tablet Take 1 tablet (20 mEq total) by mouth daily with lunch. 12/19/21   Lewayne Bunting, MD  predniSONE (DELTASONE) 10 MG tablet Take 4 tabs x 3 days, 2 tabs x 3  days, 1 x 3 days, 1/2 x 3 days and stop 11/05/22   Kalman Shan, MD  ranolazine (RANEXA) 500 MG 12 hr tablet Take 1 tablet (500 mg total) by mouth 2 (two)  times daily. 08/16/22   Duke Salvia, MD  rivaroxaban (XARELTO) 20 MG TABS tablet TAKE 1 TABLET(20 MG) BY MOUTH DAILY WITH SUPPER 12/19/21   Lewayne Bunting, MD  rosuvastatin (CRESTOR) 40 MG tablet TAKE 1 TABLET(40 MG) BY MOUTH EVERY EVENING 08/27/22   Lewayne Bunting, MD  Spacer/Aero-Holding Chambers (AEROCHAMBER MV) inhaler Use as instructed 02/21/22   Kalman Shan, MD  Tiotropium Bromide Monohydrate (SPIRIVA RESPIMAT) 1.25 MCG/ACT AERS Inhale 2 puffs into the lungs daily. Patient not taking: Reported on 11/05/2022 07/16/22   Kalman Shan, MD    Physical Exam: Vitals:   11/24/22 1230 11/24/22 1503 11/24/22 1515 11/24/22 1521  BP: 100/60 104/71 106/63   Pulse: 62 76 66   Resp: (!) 23 19 19    Temp:    98.4 F (36.9 C)  TempSrc:    Oral  SpO2: 94% 92%    Weight:      Height:       Physical Exam Constitutional:      General: He is not in acute distress.    Appearance: Normal appearance.  HENT:     Head: Normocephalic and atraumatic.     Mouth/Throat:     Mouth: Mucous membranes are moist.     Pharynx: Oropharynx is clear.  Eyes:     Extraocular Movements: Extraocular movements intact.     Pupils: Pupils are equal, round, and reactive to light.  Cardiovascular:     Rate and Rhythm: Normal rate and regular rhythm.     Pulses: Normal pulses.     Heart sounds: Normal heart sounds.  Pulmonary:     Effort: Pulmonary effort is normal. No respiratory distress.     Breath sounds: Rales present.  Abdominal:     General: Bowel sounds are normal. There is no distension.     Palpations: Abdomen is soft.     Tenderness: There is no abdominal tenderness.  Musculoskeletal:        General: No swelling or deformity.  Skin:    General: Skin is warm and dry.  Neurological:     General: No focal deficit present.     Mental  Status: Mental status is at baseline.    Labs on Admission: I have personally reviewed following labs and imaging studies  CBC: Recent Labs  Lab 11/24/22 1120  WBC 11.6*  HGB 14.7  HCT 43.9  MCV 103.5*  PLT 244    Basic Metabolic Panel: Recent Labs  Lab 11/24/22 1120  NA 131*  K 4.8  CL 99  CO2 21*  GLUCOSE 150*  BUN 16  CREATININE 0.92  CALCIUM 8.4*    GFR: Estimated Creatinine Clearance: 65 mL/min (by C-G formula based on SCr of 0.92 mg/dL).  Liver Function Tests: Recent Labs  Lab 11/24/22 1120  AST 34  ALT 21  ALKPHOS 55  BILITOT 1.1  PROT 6.3*  ALBUMIN 2.9*    Urine analysis:    Component Value Date/Time   COLORURINE YELLOW 06/15/2014 1223   APPEARANCEUR CLEAR 06/15/2014 1223   LABSPEC 1.020 06/15/2014 1223   PHURINE 5.5 06/15/2014 1223   GLUCOSEU NEGATIVE 06/15/2014 1223   HGBUR TRACE-INTACT (A) 06/15/2014 1223   BILIRUBINUR NEGATIVE 06/15/2014 1223   KETONESUR NEGATIVE 06/15/2014 1223   PROTEINUR 30 (A) 12/10/2009 1119   UROBILINOGEN 0.2 06/15/2014 1223   NITRITE NEGATIVE 06/15/2014 1223   LEUKOCYTESUR NEGATIVE 06/15/2014 1223    Radiological Exams on Admission: CT Angio Chest PE W and/or Wo Contrast  Result Date: 11/24/2022  CLINICAL DATA:  Hemoptysis.  Shortness of breath EXAM: CT ANGIOGRAPHY CHEST WITH CONTRAST TECHNIQUE: Multidetector CT imaging of the chest was performed using the standard protocol during bolus administration of intravenous contrast. Multiplanar CT image reconstructions and MIPs were obtained to evaluate the vascular anatomy. RADIATION DOSE REDUCTION: This exam was performed according to the departmental dose-optimization program which includes automated exposure control, adjustment of the mA and/or kV according to patient size and/or use of iterative reconstruction technique. CONTRAST:  75mL OMNIPAQUE IOHEXOL 350 MG/ML SOLN COMPARISON:  Plain films of earlier today. High-resolution chest CT 09/01/2022 FINDINGS:  Cardiovascular: The quality of this exam for evaluation of pulmonary embolism is good. The bolus is well timed. No evidence of pulmonary embolism. Pacer. Upper normal ascending aortic caliber at 3.9 cm. Aortic atherosclerosis. Tortuous descending thoracic aorta. Moderate to marked cardiomegaly, without pericardial effusion. Left main, lad, right coronary artery calcification. Mediastinum/Nodes: Prominent mediastinal and bilateral hilar nodal tissue. Left suprahilar mildly enlarged node measures 10 mm on 52/5 versus 8 mm on the prior. Left hilar node measures 1.3 cm on 60/5 is relatively similar on the prior. Lungs/Pleura: No pleural fluid. Moderate right hemidiaphragm elevation. Development of diffuse, slightly upper lobe predominant ground-glass opacity and septal thickening. This is superimposed upon chronic interstitial lung disease and centrilobular/paraseptal emphysema. Upper Abdomen: Normal imaged portions of the liver, spleen, stomach. Musculoskeletal: No acute osseous abnormality. Review of the MIP images confirms the above findings. IMPRESSION: 1.  No evidence of pulmonary embolism. 2. Development of diffuse, slightly upper lobe predominant ground-glass opacity and septal thickening. Lack of significant pleural fluid argues against pulmonary edema. Favor pulmonary hemorrhage or atypical infection. 3. Cardiomegaly. Coronary artery atherosclerosis. Aortic Atherosclerosis (ICD10-I70.0). Emphysema (ICD10-J43.9). Interstitial lung disease, poorly evaluated secondary to superimposed acute pulmonary findings. 4. Thoracic adenopathy which is most likely reactive. This could be re-evaluated with contrast enhanced chest CT at 3-6 months. Electronically Signed   By: Jeronimo Greaves M.D.   On: 11/24/2022 13:58   DG Chest Port 1 View  Result Date: 11/24/2022 CLINICAL DATA:  Shortness of breath. EXAM: PORTABLE CHEST 1 VIEW COMPARISON:  11/05/2022 FINDINGS: Stable pacer wires. Stable mild cardiac enlargement. Underlying  chronic lung changes with superimposed perihilar pattern of pulmonary edema. Suspect bilateral pleural effusions. No pneumothorax. IMPRESSION: Underlying chronic lung changes with superimposed perihilar pattern of pulmonary edema. Electronically Signed   By: Rudie Meyer M.D.   On: 11/24/2022 12:13    EKG: Independently reviewed.  Paced rhythm at 65 beats minute.  Right bundle branch block with QRS at 187.  QTc 481.  ST segment abnormalities likely related to paced rhythm morphology.  Similar to previous.  Assessment/Plan Principal Problem:   Acute respiratory failure with hypoxia (HCC) Active Problems:   Hyperlipidemia   Obstructive sleep apnea   Essential hypertension   Atrial fibrillation (HCC)   Chronic combined systolic and diastolic heart failure (HCC)   CAD (coronary artery disease)   Mild cognitive impairment   COPD (chronic obstructive pulmonary disease) (HCC)   Gastroesophageal reflux disease   IPF (idiopathic pulmonary fibrosis) (HCC)   Cough with hemoptysis   Acute exacerbation of idiopathic pulmonary fibrosis (HCC)    Hemoptysis Acute hypoxic respiratory failure Pulmonary fibrosis exacerbation COPD > Ongoing/recurrent hemoptysis.  Had improved with antibiotics and steroids after seeing pulmonology but has returned subsequently. > CT in the ED negative for PE but did show evidence of atypical infection versus pulmonary hemorrhage.   > IPF. Previously on amio, d/c'd due to worsening IPF. Concern for  exacerbation today. > Pulmonology consulted in the ED and will see.  Hemoglobin stable.  Mild leukocytosis to 11.6. > Received ceftriaxone and azithromycin in the ED. - Monitor on progressive unit overnight - Appreciate pulmonology recommendations and assistance - Continue home Breztri and as needed Xopenex - Continue home pirfenidone - Continue xarelto for now per pulm recs - NPO at MD, Bronch w/ BAL tomorrow - Trend CBC - F/u viral panel ADDENDUM > Progressive  hypoxia/increasing oxygen requirement.  First 6 L then 8 L then 15 L.  Xarelto discontinued.   > Ultimately was on heated high flow at 100% and 35 L saturating in the mid 90s.  Due to gradual worsening of respiratory status PCCM will take over as primary admit patient to the ICU due to increasing risk for requiring intubation.    Hypertension > Blood pressure low, in the 90-100 systolic range in the ED. - Will continue with carvedilol given his history of V. tach - Holding Entresto, eplerenone, Lasix  Hyperlipidemia - Continue home rosuvastatin  History of TIA - Continue home rosuvastatin, Xarelto  Mild cognitive impairment - Noted  CAD > No history of intervention per chart review - Continue carvedilol as above - Continue ranolazine - Holding Entresto   Atrial fibrillation History of V. Tach > History of V. tach.  Previously on amiodarone but this has been discontinued due to worsening lung function. - Continue mexiletine, carvedilol, Xarelto - AICD in place  Chronic combined systolic and diastolic CHF > Last echo in 2023 with EF 30-35%, G1 DD, normal RV function.  Is status post AICD as above. - Continue carvedilol, ranolazine - Holding Entresto, eplerenone, Lasix in the setting of low normal blood pressure as above  DVT prophylaxis: Xarelto Code Status:   Full Family Communication:  None on admission  Disposition Plan:   Patient is from:  Home  Anticipated DC to:  Home  Anticipated DC date:  1 to 3 days  Anticipated DC barriers: None  Consults called:  Pulmonology Admission status:  Observation, progressive  Severity of Illness: The appropriate patient status for this patient is OBSERVATION. Observation status is judged to be reasonable and necessary in order to provide the required intensity of service to ensure the patient's safety. The patient's presenting symptoms, physical exam findings, and initial radiographic and laboratory data in the context of their medical  condition is felt to place them at decreased risk for further clinical deterioration. Furthermore, it is anticipated that the patient will be medically stable for discharge from the hospital within 2 midnights of admission.    Synetta Fail MD [facility]  How to contact the [facility] Attending or Consulting provider 7A - 7P or covering provider during after hours 7P -7A, for this patient?   Check the care team in [facility] and look for a) attending/consulting [facility] provider listed and b) the [facility] team listed Log into [email or web address] and use [facility] universal password to access. If you do not have the password, please contact the hospital operator. Locate the [facility] provider you are looking for under [facility] and page to a number that you can be directly reached. If you still have difficulty reaching the provider, please page the [facility] (Director on Call) for the Hospitalists listed on amion for assistance.  11/24/2022, 3:51 PM

## 2022-11-24 NOTE — ED Notes (Signed)
Pt sating 79% on 6L [place]. RT called, MD notified. Pt placed on NRB.

## 2022-11-24 NOTE — Progress Notes (Signed)
PCCM:  Spoke with Dr. Alinda Money. Patient with increasing Fio2 requirements on HHFNC. I will transfer to the ICU for closer monitoring and at risk need for intubation.   Josephine Igo, DO [facility] 11/24/2022 5:50 PM

## 2022-11-25 ENCOUNTER — Encounter (HOSPITAL_COMMUNITY): Admission: EM | Disposition: E | Payer: Self-pay | Source: Home / Self Care | Attending: Pulmonary Disease

## 2022-11-25 DIAGNOSIS — Z95810 Presence of automatic (implantable) cardiac defibrillator: Secondary | ICD-10-CM | POA: Diagnosis not present

## 2022-11-25 DIAGNOSIS — Z515 Encounter for palliative care: Secondary | ICD-10-CM | POA: Diagnosis not present

## 2022-11-25 DIAGNOSIS — E8770 Fluid overload, unspecified: Secondary | ICD-10-CM | POA: Diagnosis not present

## 2022-11-25 DIAGNOSIS — I5020 Unspecified systolic (congestive) heart failure: Secondary | ICD-10-CM | POA: Diagnosis not present

## 2022-11-25 DIAGNOSIS — J440 Chronic obstructive pulmonary disease with acute lower respiratory infection: Secondary | ICD-10-CM | POA: Diagnosis present

## 2022-11-25 DIAGNOSIS — R739 Hyperglycemia, unspecified: Secondary | ICD-10-CM | POA: Diagnosis not present

## 2022-11-25 DIAGNOSIS — R918 Other nonspecific abnormal finding of lung field: Secondary | ICD-10-CM | POA: Diagnosis not present

## 2022-11-25 DIAGNOSIS — J9383 Other pneumothorax: Secondary | ICD-10-CM | POA: Diagnosis not present

## 2022-11-25 DIAGNOSIS — E875 Hyperkalemia: Secondary | ICD-10-CM | POA: Diagnosis not present

## 2022-11-25 DIAGNOSIS — R0489 Hemorrhage from other sites in respiratory passages: Secondary | ICD-10-CM | POA: Diagnosis not present

## 2022-11-25 DIAGNOSIS — Z48813 Encounter for surgical aftercare following surgery on the respiratory system: Secondary | ICD-10-CM | POA: Diagnosis not present

## 2022-11-25 DIAGNOSIS — I428 Other cardiomyopathies: Secondary | ICD-10-CM | POA: Diagnosis present

## 2022-11-25 DIAGNOSIS — Z1152 Encounter for screening for COVID-19: Secondary | ICD-10-CM | POA: Diagnosis not present

## 2022-11-25 DIAGNOSIS — F05 Delirium due to known physiological condition: Secondary | ICD-10-CM | POA: Diagnosis not present

## 2022-11-25 DIAGNOSIS — I110 Hypertensive heart disease with heart failure: Secondary | ICD-10-CM | POA: Diagnosis present

## 2022-11-25 DIAGNOSIS — I10 Essential (primary) hypertension: Secondary | ICD-10-CM | POA: Diagnosis not present

## 2022-11-25 DIAGNOSIS — N170 Acute kidney failure with tubular necrosis: Secondary | ICD-10-CM | POA: Diagnosis present

## 2022-11-25 DIAGNOSIS — Z452 Encounter for adjustment and management of vascular access device: Secondary | ICD-10-CM | POA: Diagnosis not present

## 2022-11-25 DIAGNOSIS — N179 Acute kidney failure, unspecified: Secondary | ICD-10-CM | POA: Diagnosis not present

## 2022-11-25 DIAGNOSIS — J9621 Acute and chronic respiratory failure with hypoxia: Secondary | ICD-10-CM | POA: Diagnosis present

## 2022-11-25 DIAGNOSIS — K7200 Acute and subacute hepatic failure without coma: Secondary | ICD-10-CM | POA: Diagnosis not present

## 2022-11-25 DIAGNOSIS — Z66 Do not resuscitate: Secondary | ICD-10-CM | POA: Diagnosis present

## 2022-11-25 DIAGNOSIS — R590 Localized enlarged lymph nodes: Secondary | ICD-10-CM | POA: Diagnosis not present

## 2022-11-25 DIAGNOSIS — K760 Fatty (change of) liver, not elsewhere classified: Secondary | ICD-10-CM | POA: Diagnosis present

## 2022-11-25 DIAGNOSIS — J984 Other disorders of lung: Secondary | ICD-10-CM | POA: Diagnosis not present

## 2022-11-25 DIAGNOSIS — R570 Cardiogenic shock: Secondary | ICD-10-CM | POA: Diagnosis present

## 2022-11-25 DIAGNOSIS — J189 Pneumonia, unspecified organism: Secondary | ICD-10-CM | POA: Diagnosis present

## 2022-11-25 DIAGNOSIS — R34 Anuria and oliguria: Secondary | ICD-10-CM | POA: Diagnosis not present

## 2022-11-25 DIAGNOSIS — K567 Ileus, unspecified: Secondary | ICD-10-CM | POA: Diagnosis present

## 2022-11-25 DIAGNOSIS — E8729 Other acidosis: Secondary | ICD-10-CM | POA: Diagnosis present

## 2022-11-25 DIAGNOSIS — J939 Pneumothorax, unspecified: Secondary | ICD-10-CM | POA: Diagnosis not present

## 2022-11-25 DIAGNOSIS — J84112 Idiopathic pulmonary fibrosis: Secondary | ICD-10-CM | POA: Diagnosis present

## 2022-11-25 DIAGNOSIS — I517 Cardiomegaly: Secondary | ICD-10-CM | POA: Diagnosis not present

## 2022-11-25 DIAGNOSIS — J9601 Acute respiratory failure with hypoxia: Secondary | ICD-10-CM | POA: Diagnosis not present

## 2022-11-25 DIAGNOSIS — I5042 Chronic combined systolic (congestive) and diastolic (congestive) heart failure: Secondary | ICD-10-CM | POA: Diagnosis present

## 2022-11-25 DIAGNOSIS — R9389 Abnormal findings on diagnostic imaging of other specified body structures: Secondary | ICD-10-CM | POA: Diagnosis not present

## 2022-11-25 DIAGNOSIS — R0609 Other forms of dyspnea: Secondary | ICD-10-CM | POA: Diagnosis not present

## 2022-11-25 DIAGNOSIS — E871 Hypo-osmolality and hyponatremia: Secondary | ICD-10-CM | POA: Diagnosis present

## 2022-11-25 DIAGNOSIS — J9691 Respiratory failure, unspecified with hypoxia: Secondary | ICD-10-CM | POA: Diagnosis not present

## 2022-11-25 DIAGNOSIS — Z4682 Encounter for fitting and adjustment of non-vascular catheter: Secondary | ICD-10-CM | POA: Diagnosis not present

## 2022-11-25 DIAGNOSIS — R579 Shock, unspecified: Secondary | ICD-10-CM | POA: Diagnosis not present

## 2022-11-25 DIAGNOSIS — G9341 Metabolic encephalopathy: Secondary | ICD-10-CM | POA: Diagnosis not present

## 2022-11-25 DIAGNOSIS — I255 Ischemic cardiomyopathy: Secondary | ICD-10-CM | POA: Diagnosis present

## 2022-11-25 DIAGNOSIS — I4891 Unspecified atrial fibrillation: Secondary | ICD-10-CM | POA: Diagnosis present

## 2022-11-25 DIAGNOSIS — R042 Hemoptysis: Secondary | ICD-10-CM | POA: Diagnosis present

## 2022-11-25 DIAGNOSIS — E440 Moderate protein-calorie malnutrition: Secondary | ICD-10-CM | POA: Diagnosis present

## 2022-11-25 DIAGNOSIS — I5082 Biventricular heart failure: Secondary | ICD-10-CM | POA: Diagnosis present

## 2022-11-25 LAB — EXPECTORATED SPUTUM ASSESSMENT W GRAM STAIN, RFLX TO RESP C

## 2022-11-25 LAB — COMPREHENSIVE METABOLIC PANEL
ALT: 21 U/L (ref 0–44)
AST: 39 U/L (ref 15–41)
Albumin: 2.6 g/dL — ABNORMAL LOW (ref 3.5–5.0)
Alkaline Phosphatase: 56 U/L (ref 38–126)
Anion gap: 10 (ref 5–15)
BUN: 15 mg/dL (ref 8–23)
CO2: 20 mmol/L — ABNORMAL LOW (ref 22–32)
Calcium: 8.3 mg/dL — ABNORMAL LOW (ref 8.9–10.3)
Chloride: 98 mmol/L (ref 98–111)
Creatinine, Ser: 0.78 mg/dL (ref 0.61–1.24)
GFR, Estimated: >60 mL/min (ref >60)
Glucose, Bld: 92 mg/dL (ref 70–99)
Potassium: 4.3 mmol/L (ref 3.5–5.1)
Sodium: 128 mmol/L — ABNORMAL LOW (ref 135–145)
Total Bilirubin: 1.2 mg/dL — ABNORMAL HIGH (ref <1.2)
Total Protein: 6 g/dL — ABNORMAL LOW (ref 6.5–8.1)

## 2022-11-25 LAB — C-REACTIVE PROTEIN: CRP: 22.7 mg/dL — ABNORMAL HIGH (ref <1.0)

## 2022-11-25 LAB — BASIC METABOLIC PANEL
Anion gap: 13 (ref 5–15)
BUN: 19 mg/dL (ref 8–23)
CO2: 22 mmol/L (ref 22–32)
Calcium: 8.3 mg/dL — ABNORMAL LOW (ref 8.9–10.3)
Chloride: 94 mmol/L — ABNORMAL LOW (ref 98–111)
Creatinine, Ser: 0.85 mg/dL (ref 0.61–1.24)
GFR, Estimated: >60 mL/min (ref >60)
Glucose, Bld: 115 mg/dL — ABNORMAL HIGH (ref 70–99)
Potassium: 4 mmol/L (ref 3.5–5.1)
Sodium: 129 mmol/L — ABNORMAL LOW (ref 135–145)

## 2022-11-25 LAB — URIC ACID: Uric Acid, Serum: 3 mg/dL — ABNORMAL LOW (ref 3.7–8.6)

## 2022-11-25 LAB — SEDIMENTATION RATE: Sed Rate: 14 mm/h (ref 0–16)

## 2022-11-25 LAB — OSMOLALITY, URINE: Osmolality, Ur: 762 mosm/kg (ref 300–900)

## 2022-11-25 LAB — OSMOLALITY: Osmolality: 272 mosm/kg — ABNORMAL LOW (ref 275–295)

## 2022-11-25 LAB — CBC
HCT: 42.8 % (ref 39.0–52.0)
Hemoglobin: 14.3 g/dL (ref 13.0–17.0)
MCH: 34.1 pg — ABNORMAL HIGH (ref 26.0–34.0)
MCHC: 33.4 g/dL (ref 30.0–36.0)
MCV: 102.1 fL — ABNORMAL HIGH (ref 80.0–100.0)
Platelets: 231 10*3/uL (ref 150–400)
RBC: 4.19 MIL/uL — ABNORMAL LOW (ref 4.22–5.81)
RDW: 14.1 % (ref 11.5–15.5)
WBC: 13.6 10*3/uL — ABNORMAL HIGH (ref 4.0–10.5)
nRBC: 0 % (ref 0.0–0.2)

## 2022-11-25 LAB — HEMOGLOBIN A1C
Hgb A1c MFr Bld: 5.3 % (ref 4.8–5.6)
Mean Plasma Glucose: 105.41 mg/dL

## 2022-11-25 LAB — BRAIN NATRIURETIC PEPTIDE: B Natriuretic Peptide: 423.9 pg/mL — ABNORMAL HIGH (ref 0.0–100.0)

## 2022-11-25 LAB — GLUCOSE, CAPILLARY
Glucose-Capillary: 122 mg/dL — ABNORMAL HIGH (ref 70–99)
Glucose-Capillary: 125 mg/dL — ABNORMAL HIGH (ref 70–99)

## 2022-11-25 LAB — PROCALCITONIN: Procalcitonin: 0.35 ng/mL

## 2022-11-25 LAB — CORTISOL: Cortisol, Plasma: 35 ug/dL

## 2022-11-25 LAB — SODIUM, URINE, RANDOM: Sodium, Ur: <10 mmol/L

## 2022-11-25 LAB — TSH: TSH: 2.038 u[IU]/mL (ref 0.350–4.500)

## 2022-11-25 SURGERY — VIDEO BRONCHOSCOPY WITHOUT FLUORO
Anesthesia: General | Laterality: Bilateral

## 2022-11-25 MED ORDER — PIRFENIDONE 267 MG PO TABS
534.0000 mg | ORAL_TABLET | Freq: Three times a day (TID) | ORAL | Status: DC
  Filled 2022-11-25 (×7): qty 2

## 2022-11-25 MED ORDER — METHYLPREDNISOLONE SODIUM SUCC 40 MG IJ SOLR
40.0000 mg | Freq: Two times a day (BID) | INTRAMUSCULAR | Status: DC
  Administered 2022-11-25 (×2): 40 mg via INTRAVENOUS
  Filled 2022-11-25 (×2): qty 1

## 2022-11-25 MED ORDER — PIRFENIDONE 267 MG PO TABS
534.0000 mg | ORAL_TABLET | Freq: Three times a day (TID) | ORAL | Status: DC
  Filled 2022-11-25 (×6): qty 2

## 2022-11-25 MED ORDER — PANTOPRAZOLE SODIUM 40 MG PO TBEC
40.0000 mg | DELAYED_RELEASE_TABLET | Freq: Two times a day (BID) | ORAL | Status: DC
  Administered 2022-11-25 (×2): 40 mg via ORAL
  Filled 2022-11-25 (×2): qty 1

## 2022-11-25 MED ORDER — INSULIN ASPART 100 UNIT/ML IJ SOLN
0.0000 [IU] | Freq: Three times a day (TID) | INTRAMUSCULAR | Status: DC
  Administered 2022-11-25 – 2022-11-26 (×2): 1 [IU] via SUBCUTANEOUS

## 2022-11-25 MED ORDER — INSULIN ASPART 100 UNIT/ML IJ SOLN: 0.0000 [IU] | Freq: Every day | INTRAMUSCULAR | Status: DC

## 2022-11-25 MED ORDER — GUAIFENESIN-DM 100-10 MG/5ML PO SYRP: 5.0000 mL | ORAL_SOLUTION | ORAL | Status: DC | PRN

## 2022-11-25 MED ORDER — FUROSEMIDE 10 MG/ML IJ SOLN: 40.0000 mg | Freq: Once | INTRAMUSCULAR | Status: DC

## 2022-11-25 MED ORDER — FUROSEMIDE 10 MG/ML IJ SOLN
40.0000 mg | Freq: Once | INTRAMUSCULAR | Status: AC
  Administered 2022-11-25: 40 mg via INTRAVENOUS
  Filled 2022-11-25: qty 4

## 2022-11-25 MED ORDER — DEXMEDETOMIDINE HCL IN NACL 400 MCG/100ML IV SOLN
0.0000 ug/kg/h | INTRAVENOUS | Status: DC
  Administered 2022-11-25: 0.4 ug/kg/h via INTRAVENOUS
  Administered 2022-11-26: 0.5 ug/kg/h via INTRAVENOUS
  Administered 2022-11-26: 0.7 ug/kg/h via INTRAVENOUS
  Administered 2022-11-26: 0.4 ug/kg/h via INTRAVENOUS
  Filled 2022-11-25 (×4): qty 100

## 2022-11-25 NOTE — Progress Notes (Addendum)
No distress this afternoon Still requiring high FIO2 but certainly not worse.  Tolerated being up in chair earlier Na remains stable 128-->129 Sputum needs to be re-collected BP boarderline mid 90s not tachycardic  Plan Cont O2 Cont systemic steroids Hold lasix tonight  Cont abx F/u pending labs  Simonne Martinet ACNP-BC [facility] Pulmonary/Critical Care Pager # [phone] OR # [phone] if no answer

## 2022-11-25 NOTE — Progress Notes (Signed)
NAME:  [name], MRN:  [id number], DOB:  May 14, 1940, LOS: 0 ADMISSION DATE:  11/24/2022, CONSULTATION DATE:  11/24/2022  REFERRING MD:  Dr. Alinda Money, CHIEF COMPLAINT:  hemoptysis    History of Present Illness:  Per Dr. Jane Canary recent office note. [name] 82 y.o. -he is here for an acute visit.  This because of hemoptysis.  He tells me that late in summer 2020 for his approximately September 2024 he had hemoptysis.  He was given Keflex and it went away but then it came back.  Nurse practitioner gave steroids it went away but now it is back since October 29, 2022.  Its present only 1 time early in the morning when he brings out his sputum.  He feels like the something behind his sternum that he has to cough out is there with mucus.  He is on Xarelto.  There is no change in the shortness of breath there is no fever.  He was recently given Keflex again but it does not help.  He brought in the tissues of his hemoptysis and plastic packet for me to see this.  He is really concerned about this.  He is wondering about repeat steroid course.  He is wondering about repeat antibiotic course.  He is also wondering about the etiology.   Patient was treated with doxycycline and steroids.  Doxycycline made him sick to his stomach and he could not take it.  He switched to Keflex.  He is still on Xarelto for A-fib.  He has had scant sputum production but his O2 sats were low so the decision today was come to the emergency room because he had O2 sats in the 70s.  He is now satting in the low 90s on 4 L.  This is a new oxygen requirement for him.  Pertinent  Medical History   Past Medical History:  Diagnosis Date   Adenomatous colon polyp 2009; 06/2012   Colonoscopy 07/2007---Dr. Maryjane Hurter [facility]--Repeat 06/2012 showed one tubular adenoma w/out high grade dysplasia.   AICD (automatic cardioverter/defibrillator) present    Environmental manager   Atrial fibrillation (HCC)    Bilateral sensorineural hearing loss     BPH (benign prostatic hypertrophy)    PSAs ok (followed by urologist): no LUTS   CAD (coronary artery disease)    PTCA RCA 1997 (Inf/post MI);   CHF (congestive heart failure) (HCC)    Chronic constipation    Chronic rhinitis    ?vasomotor (Dr. Lazarus Salines)   COPD (chronic obstructive pulmonary disease) (HCC)    Diverticulosis of colon 2005   Noted on Colonoscopies in 2005 and 2009 and on CT 2010   Gallstones 06/2014   No evidence of cholecystitis on abd u/s 06/2014   GERD (gastroesophageal reflux disease)    Hepatic steatosis 2010; 2016   Noted on CT abd 2010 and on u/s abd 04/2009 (transaminasemia) and u/s 06/2014.   Hyperlipidemia    Hypertension    Ischemic cardiomyopathy    Microscopic hematuria    Cysto neg 02/2009 except for BPH-likely has microhem from his renal stone dz   Nephrolithiasis    No distal stones on CT 2010; stable 8 mm RLP stone, 3mm LMP stone 10/2013 plain film.  Abd u/s 06/2014: 9 mm nonobstructing lower pole right renal stone.  03/2015 urol: stable nonobstructing renal calc by CT 12/2014 and KUB 03/2015.   Obstructive sleep apnea    no cpap uses since weight loss of 50 pounds   Presence of  permanent cardiac pacemaker    [employer]   Squamous cell carcinoma of back    Ventricular tachycardia (HCC)      Significant Hospital Events: Including procedures, antibiotic start and stop dates in addition to other pertinent events   11/10 admitted w/ hemoptysis and acute hypoxic resp failure. Sats 70s on room air. Moved to ICU given concern for high risk intubation., Started on CAP coverage. Made NPO for FOB. O2 needs up to 15 liters so Xarelto stopped 11/11 resp viral panel neg, covid neg. MRSA neg  Interim History / Subjective:  Still very short of breath  Coughing up about 3-4 episodes of dime sized streaky hemoptysis every 8 hrs or so   Objective   Blood pressure (!) 108/57, pulse 66, temperature 98 F (36.7 C), temperature source Oral, resp. rate (!) 25,  height 5\' 10"  (1.778 m), weight 80.7 kg, SpO2 93%.    FiO2 (%):  [100 %] 100 %   Intake/Output Summary (Last 24 hours) at 11/25/2022 0732 Last data filed at 11/25/2022 0528 Gross per 24 hour  Intake --  Output 875 ml  Net -875 ml   Filed Weights   11/24/22 1039 11/25/22 0500  Weight: 83.9 kg 80.7 kg    Examination: General 82 year old male. Laying in bed. He is in no distress but still on high FIO2 HENT NCAT no JVD Pul fine posterior crackles. No accessory use. Talking in full sentences but is on 100% heated high flow at 35 LPM Card rrr no MRG Abd soft Ext warm and dry no edema Gu voids Neuro awake, oriented. Does have some short term memory deficits   Resolved Hospital Problem list     Assessment & Plan:  Acute Hypoxic respiratory failure 2/2 Acute exacerbation of interstitial lung disease w/ Hemoptysis Can't exclude CAP vs DAH  CT with bilateral groundglass infiltrates, possible hemorrhage Anticoagulated on Xarelto with A-fib. Had Peripheral blood eosinophilia,  Plan: Cont supplemental Oxygen. Currently on 35 liters 100% heated high flow Cont wean O2 for sats >90% Cont pulse ox  Cont broad spec CAP coverage day# 2 (CTX and azith) Sending Fungitell Beta-D-glucan, PCP, DFA,, sputum, AFB and fungal culture Start solumedrol 1mg /kg X 3-5d then taper slowly depending on clinical response  Aborting consideration for FOB given high risk of VDRF and also given his age and underlying CM  Cont triple combo nebs Cough suppression  If hemoptysis gets worse  give nebulzied TXA Xarelto placed on hold 11/10 Am CXR  Dc resp precautions   H/o Afib, CAD, HLD, chronic combined HFrEF (30-35% EF), h/o VT, and HTN mild troponinemia  Plan Cont tele  Holding his antihypertensives: Entresto, eplerenone, Lasix  Rate control: cont coreg  Holding AC  Cont statin   Hyponatremia somewhat chronic, getting a little worse  Plan Check UNa, Uosmo, plasma Osmo, cortisol, TSH, serum and urine  uric acid   Mild NAGMA Plan Supportive care Mont to monitor   H/o TIA and cognitive impairment  Plan Supportive care   Best Practice (right click and "Reselect all SmartList Selections" daily)   Diet/type: NPO w/ oral meds DVT prophylaxis: SCD GI prophylaxis: PPI Lines: N/A Foley:  N/A Code Status:  full code Last date of multidisciplinary goals of care discussion [pending]    My critical care time is 48 minutes Simonne Martinet ACNP-BC [facility] Pulmonary/Critical Care Pager # [phone] OR # [phone] if no answer

## 2022-11-25 NOTE — Progress Notes (Addendum)
eLink Physician-Brief Progress Note Patient Name: DEMETRIC NORTHRIP DOB: [birth year] MRN: [id number]   Date of Service  11/25/2022  HPI/Events of Note  RN notified about need to go on NRB on oprtiflow for desaturating.  Camera eval done and discussed with RN. VS  stable. Mentating well. No leg edema. HR < 90. MAP > 70.  Acute hypoxic respiratory failure UIP Hemoptysis HFrEF due to NICM with ICD Atrial fibrillation  Data: Stable anemia. Received lasix in noon and solumedrol On CAP coverage.    eICU Interventions  To go on BiPAP 12/8, to keep sats between 88 to 94%. Prn ABG. Asp precautions.  Low thresh hold for intubation.      Intervention Category Intermediate Interventions: Respiratory distress - evaluation and management  Ranee Gosselin 11/25/2022, 8:07 PM  22:04 Camera: anxious, on BiPAP. Sats ok  Start low dose precedex, asp precautions. Watch for bradyrhythmia, keep K/Mag >4/2 respectively.

## 2022-11-25 NOTE — Progress Notes (Signed)
Afternoon labs from urine studies reviewed.  UNa would suggest CHF vs volume depletion as pt not excreting Na BUT the uOSMO pretty high. I think this reflects early diuretic administration although would have thought uNa would be increased after lasix  Plan Will repeat chem at 1400 Got lasix already. I do think there is a component of volume overload more so than SIADH

## 2022-11-25 NOTE — TOC Initial Note (Signed)
Transition of Care [facility]) - Initial/Assessment Note    Patient Details  Name: [name] MRN: [id number] Date of Birth: 09/25/1940  [facility]) CM/SW Contact:    Elliot Cousin, RN Phone Number: [phone] 11/25/2022, 3:45 PM  Clinical Narrative:  CM spoke to pt at bedside. Gave permission to call wife.  Pt states he would like DME arrange with [employer]. Pt may need oxygen for home. Contacted [employer] rep, Morrie Sheldon with new referral. Will pulm oxygen sat qualification note and orders for oxygen.  Offered choice for HH (medicare.gov list with ratings placed on chart and provided to pt) pt agreeable to [facility] for [facility] RN. Contacted rep, Eber Jones with new referral. Will need HHRN order with [facility] for Disease Mgmt. Will wait PT recommendations. May need HH PT.                   Expected Discharge Plan: Home w Home Health Services Barriers to Discharge: Continued Medical Work up   Patient Goals and CMS Choice Patient states their goals for this hospitalization and ongoing recovery are:: wants to remain independent CMS Medicare.gov Compare Post Acute Care list provided to:: Patient Represenative (must comment) Choice offered to / list presented to : Patient      Expected Discharge Plan and Services   Discharge Planning Services: CM Consult Post Acute Care Choice: Home Health Living arrangements for the past 2 months: Single Family Home                   DME Agency: Patsy Lager Date DME Agency Contacted: 11/25/22 Time DME Agency Contacted: 1520 Representative spoke with at DME Agency: Morrie Sheldon HH Arranged: RN HH Agency: [facility] ([facility] Date [facility] Agency Contacted: 11/25/22 Time HH Agency Contacted: 1520 Representative spoke with at [facility] Agency: Eber Jones  Prior Living Arrangements/Services Living arrangements for the past 2 months: Single Family Home Lives with:: Spouse   Do you feel safe going back to the place  where you live?: Yes      Need for Family Participation in Patient Care: No (Comment) Care giver support system in place?: Yes (comment)   Criminal Activity/Legal Involvement Pertinent to Current Situation/Hospitalization: No - Comment as needed  Activities of Daily Living      Permission Sought/Granted Permission sought to share information with : Case Manager, Family Supports, PCP Permission granted to share information with : Yes, Verbal Permission Granted  Share Information with NAME: Maki Enslen  Permission granted to share info w AGENCY: Home Health, DME  Permission granted to share info w Relationship: wife  Permission granted to share info w Contact Information: [phone]  Emotional Assessment Appearance:: Appears stated age Attitude/Demeanor/Rapport: Engaged Affect (typically observed): Accepting Orientation: : Oriented to Self, Oriented to Place, Oriented to  Time, Oriented to Situation   Psych Involvement: No (comment)  Admission diagnosis:  Cough with hemoptysis [R04.2] Acute respiratory failure with hypoxia (HCC) [J96.01] Patient Active Problem List   Diagnosis Date Noted   Cough with hemoptysis 11/24/2022   Acute respiratory failure with hypoxia (HCC) 11/24/2022   Acute exacerbation of idiopathic pulmonary fibrosis (HCC) 11/24/2022   NICM (nonischemic cardiomyopathy) (HCC) 08/16/2022   Physical deconditioning 04/06/2021   IPF (idiopathic pulmonary fibrosis) (HCC) 11/16/2020   Encounter for follow-up surveillance of colon cancer    Benign neoplasm of descending colon    Pulmonary nodules 05/18/2015   Rectal bleeding 03/29/2015   Internal hemorrhoid, bleeding 03/29/2015  COPD (chronic obstructive pulmonary disease) (HCC) 03/28/2015   Gastroesophageal reflux disease 03/17/2015   Prostate cancer screening 03/09/2014   Thoracic aortic aneurysm (HCC) 09/28/2013   Encounter for therapeutic drug monitoring 03/03/2013   Constipation, chronic 10/21/2012    Umbilical hernia with intermittent pain 10/21/2012   Overweight (BMI 25.0-29.9) 10/21/2012   TIA (transient ischemic attack) 09/25/2012   Other malaise and fatigue 08/25/2012   Vitamin D deficiency 07/28/2012   Chronotropic incompetence 10/25/2011   History of non anemic vitamin B12 deficiency 05/29/2011   Mild cognitive impairment 01/13/2011   Ringing in ears 01/13/2011   CAD (coronary artery disease) 09/14/2010   Elevated transaminase level--on amiodarone 06/19/2010   Biventricular implantable cardioverter-defibrillator in situ 05/16/2010   Long term (current) use of anticoagulants 04/11/2010   Microscopic hematuria 04/04/2010   CLAUDICATION 11/28/2009   Atrial fibrillation (HCC) 06/29/2009   Chronic combined systolic and diastolic heart failure (HCC) 05/26/2009   Ischemic cardiomyopathy 07/07/2008   VENTRICULAR TACHYCARDIA 06/29/2008   Hyperlipidemia 01/04/2008   Essential hypertension 01/04/2008   Chronic rhinitis 03/04/2007   Obstructive sleep apnea 03/02/2007   PCP:  Sheliah Hatch, MD Pharmacy:   [facility] [phone] - [place], [place] - [address] AT [facility] [address] [place] [address] Phone: [phone] Fax: [phone]     Social Determinants of Health (SDOH) Social History: SDOH Screenings   Food Insecurity: No Food Insecurity (11/13/2021)  Housing: Low Risk  (11/13/2021)  Transportation Needs: No Transportation Needs (11/13/2021)  Utilities: Not At Risk (11/13/2021)  Alcohol Screen: Low Risk  (11/13/2021)  Depression (PHQ2-9): Medium Risk (09/18/2022)  Financial Resource Strain: Low Risk  (11/13/2021)  Physical Activity: Insufficiently Active (11/13/2021)  Social Connections: Moderately Isolated (11/13/2021)  Stress: No Stress Concern Present (11/13/2021)  Tobacco Use: Medium Risk (11/24/2022)   SDOH Interventions:     Readmission Risk Interventions     No data to display

## 2022-11-26 ENCOUNTER — Inpatient Hospital Stay (HOSPITAL_COMMUNITY): Payer: Medicare Other

## 2022-11-26 ENCOUNTER — Ambulatory Visit: Payer: Medicare Other | Admitting: Family Medicine

## 2022-11-26 DIAGNOSIS — J9601 Acute respiratory failure with hypoxia: Secondary | ICD-10-CM | POA: Diagnosis not present

## 2022-11-26 DIAGNOSIS — Z452 Encounter for adjustment and management of vascular access device: Secondary | ICD-10-CM | POA: Diagnosis not present

## 2022-11-26 DIAGNOSIS — R0609 Other forms of dyspnea: Secondary | ICD-10-CM

## 2022-11-26 DIAGNOSIS — R042 Hemoptysis: Secondary | ICD-10-CM | POA: Diagnosis not present

## 2022-11-26 DIAGNOSIS — Z48813 Encounter for surgical aftercare following surgery on the respiratory system: Secondary | ICD-10-CM | POA: Diagnosis not present

## 2022-11-26 DIAGNOSIS — J939 Pneumothorax, unspecified: Secondary | ICD-10-CM | POA: Diagnosis not present

## 2022-11-26 DIAGNOSIS — J984 Other disorders of lung: Secondary | ICD-10-CM | POA: Diagnosis not present

## 2022-11-26 DIAGNOSIS — R918 Other nonspecific abnormal finding of lung field: Secondary | ICD-10-CM | POA: Diagnosis not present

## 2022-11-26 DIAGNOSIS — R9389 Abnormal findings on diagnostic imaging of other specified body structures: Secondary | ICD-10-CM | POA: Diagnosis not present

## 2022-11-26 LAB — COMPREHENSIVE METABOLIC PANEL
ALT: 22 U/L (ref 0–44)
ALT: 28 U/L (ref 0–44)
ALT: 31 U/L (ref 0–44)
AST: 59 U/L — ABNORMAL HIGH (ref 15–41)
AST: 63 U/L — ABNORMAL HIGH (ref 15–41)
AST: 69 U/L — ABNORMAL HIGH (ref 15–41)
Albumin: 2.5 g/dL — ABNORMAL LOW (ref 3.5–5.0)
Albumin: 2.4 g/dL — ABNORMAL LOW (ref 3.5–5.0)
Albumin: 2.5 g/dL — ABNORMAL LOW (ref 3.5–5.0)
Alkaline Phosphatase: 81 U/L (ref 38–126)
Alkaline Phosphatase: 89 U/L (ref 38–126)
Alkaline Phosphatase: 82 U/L (ref 38–126)
Anion gap: 12 (ref 5–15)
Anion gap: 12 (ref 5–15)
Anion gap: 10 (ref 5–15)
BUN: 27 mg/dL — ABNORMAL HIGH (ref 8–23)
BUN: 30 mg/dL — ABNORMAL HIGH (ref 8–23)
BUN: 37 mg/dL — ABNORMAL HIGH (ref 8–23)
CO2: 19 mmol/L — ABNORMAL LOW (ref 22–32)
CO2: 21 mmol/L — ABNORMAL LOW (ref 22–32)
CO2: 23 mmol/L (ref 22–32)
Calcium: 8.7 mg/dL — ABNORMAL LOW (ref 8.9–10.3)
Calcium: 8.5 mg/dL — ABNORMAL LOW (ref 8.9–10.3)
Calcium: 8.6 mg/dL — ABNORMAL LOW (ref 8.9–10.3)
Chloride: 95 mmol/L — ABNORMAL LOW (ref 98–111)
Chloride: 96 mmol/L — ABNORMAL LOW (ref 98–111)
Chloride: 96 mmol/L — ABNORMAL LOW (ref 98–111)
Creatinine, Ser: 0.88 mg/dL (ref 0.61–1.24)
Creatinine, Ser: 0.96 mg/dL (ref 0.61–1.24)
Creatinine, Ser: 1.28 mg/dL — ABNORMAL HIGH (ref 0.61–1.24)
GFR, Estimated: >60 mL/min (ref >60)
GFR, Estimated: >60 mL/min (ref >60)
GFR, Estimated: 56 mL/min — ABNORMAL LOW (ref >60)
Glucose, Bld: 120 mg/dL — ABNORMAL HIGH (ref 70–99)
Glucose, Bld: 122 mg/dL — ABNORMAL HIGH (ref 70–99)
Glucose, Bld: 205 mg/dL — ABNORMAL HIGH (ref 70–99)
Potassium: 4.4 mmol/L (ref 3.5–5.1)
Potassium: 4.2 mmol/L (ref 3.5–5.1)
Potassium: 4.8 mmol/L (ref 3.5–5.1)
Sodium: 126 mmol/L — ABNORMAL LOW (ref 135–145)
Sodium: 129 mmol/L — ABNORMAL LOW (ref 135–145)
Sodium: 129 mmol/L — ABNORMAL LOW (ref 135–145)
Total Bilirubin: 1.6 mg/dL — ABNORMAL HIGH (ref <1.2)
Total Bilirubin: 0.8 mg/dL (ref <1.2)
Total Bilirubin: 1 mg/dL (ref <1.2)
Total Protein: 5.9 g/dL — ABNORMAL LOW (ref 6.5–8.1)
Total Protein: 5.6 g/dL — ABNORMAL LOW (ref 6.5–8.1)
Total Protein: 5.7 g/dL — ABNORMAL LOW (ref 6.5–8.1)

## 2022-11-26 LAB — PROCALCITONIN: Procalcitonin: 1.35 ng/mL

## 2022-11-26 LAB — BODY FLUID CELL COUNT WITH DIFFERENTIAL
Eos, Fluid: 0 %
Lymphs, Fluid: 2 %
Monocyte-Macrophage-Serous Fluid: 2 % — ABNORMAL LOW (ref 50–90)
Neutrophil Count, Fluid: 96 % — ABNORMAL HIGH (ref 0–25)
Total Nucleated Cell Count, Fluid: 375 uL (ref 0–1000)

## 2022-11-26 LAB — ECHOCARDIOGRAM COMPLETE
AR max vel: 2.25 cm2
AV Area VTI: 2.43 cm2
AV Area mean vel: 2.39 cm2
AV Mean grad: 2 mm[Hg]
AV Peak grad: 4.1 mm[Hg]
Ao pk vel: 1.01 m/s
Area-P 1/2: 4.68 cm2
Est EF: <20
Height: 70 in
MV M vel: 4.13 m/s
MV Peak grad: 68.2 mm[Hg]
Radius: 1.1 cm
S' Lateral: 6.4 cm
Weight: 2888.91 [oz_av]

## 2022-11-26 LAB — POCT I-STAT 7, (LYTES, BLD GAS, ICA,H+H)
Acid-base deficit: 7 mmol/L — ABNORMAL HIGH (ref 0.0–2.0)
Acid-base deficit: 6 mmol/L — ABNORMAL HIGH (ref 0.0–2.0)
Acid-base deficit: 5 mmol/L — ABNORMAL HIGH (ref 0.0–2.0)
Bicarbonate: 23.2 mmol/L (ref 20.0–28.0)
Bicarbonate: 24.9 mmol/L (ref 20.0–28.0)
Bicarbonate: 22.4 mmol/L (ref 20.0–28.0)
Calcium, Ion: 1.22 mmol/L (ref 1.15–1.40)
Calcium, Ion: 1.22 mmol/L (ref 1.15–1.40)
Calcium, Ion: 1.17 mmol/L (ref 1.15–1.40)
HCT: 45 % (ref 39.0–52.0)
HCT: 43 % (ref 39.0–52.0)
HCT: 42 % (ref 39.0–52.0)
Hemoglobin: 15.3 g/dL (ref 13.0–17.0)
Hemoglobin: 14.6 g/dL (ref 13.0–17.0)
Hemoglobin: 14.3 g/dL (ref 13.0–17.0)
O2 Saturation: 100 %
O2 Saturation: 100 %
O2 Saturation: 99 %
Patient temperature: 97
Patient temperature: 96.1
Patient temperature: 36.6
Potassium: 4.6 mmol/L (ref 3.5–5.1)
Potassium: 5.1 mmol/L (ref 3.5–5.1)
Potassium: 5.4 mmol/L — ABNORMAL HIGH (ref 3.5–5.1)
Sodium: 128 mmol/L — ABNORMAL LOW (ref 135–145)
Sodium: 128 mmol/L — ABNORMAL LOW (ref 135–145)
Sodium: 127 mmol/L — ABNORMAL LOW (ref 135–145)
TCO2: 25 mmol/L (ref 22–32)
TCO2: 27 mmol/L (ref 22–32)
TCO2: 24 mmol/L (ref 22–32)
pCO2 arterial: 66.9 mm[Hg] (ref 32–48)
pCO2 arterial: 73.6 mm[Hg] (ref 32–48)
pCO2 arterial: 48.7 mm[Hg] — ABNORMAL HIGH (ref 32–48)
pH, Arterial: 7.144 — CL (ref 7.35–7.45)
pH, Arterial: 7.129 — CL (ref 7.35–7.45)
pH, Arterial: 7.268 — ABNORMAL LOW (ref 7.35–7.45)
pO2, Arterial: 298 mm[Hg] — ABNORMAL HIGH (ref 83–108)
pO2, Arterial: 237 mm[Hg] — ABNORMAL HIGH (ref 83–108)
pO2, Arterial: 136 mm[Hg] — ABNORMAL HIGH (ref 83–108)

## 2022-11-26 LAB — CBC
HCT: 43.1 % (ref 39.0–52.0)
Hemoglobin: 14.8 g/dL (ref 13.0–17.0)
MCH: 34.9 pg — ABNORMAL HIGH (ref 26.0–34.0)
MCHC: 34.3 g/dL (ref 30.0–36.0)
MCV: 101.7 fL — ABNORMAL HIGH (ref 80.0–100.0)
Platelets: 147 10*3/uL — ABNORMAL LOW (ref 150–400)
RBC: 4.24 MIL/uL (ref 4.22–5.81)
RDW: 13.9 % (ref 11.5–15.5)
WBC: 15.4 10*3/uL — ABNORMAL HIGH (ref 4.0–10.5)
nRBC: 0 % (ref 0.0–0.2)

## 2022-11-26 LAB — MAGNESIUM: Magnesium: 2.3 mg/dL (ref 1.7–2.4)

## 2022-11-26 LAB — GLUCOSE, CAPILLARY
Glucose-Capillary: 142 mg/dL — ABNORMAL HIGH (ref 70–99)
Glucose-Capillary: 153 mg/dL — ABNORMAL HIGH (ref 70–99)
Glucose-Capillary: 179 mg/dL — ABNORMAL HIGH (ref 70–99)
Glucose-Capillary: 175 mg/dL — ABNORMAL HIGH (ref 70–99)
Glucose-Capillary: 205 mg/dL — ABNORMAL HIGH (ref 70–99)

## 2022-11-26 LAB — BRAIN NATRIURETIC PEPTIDE: B Natriuretic Peptide: 437.2 pg/mL — ABNORMAL HIGH (ref 0.0–100.0)

## 2022-11-26 LAB — COOXEMETRY PANEL
Carboxyhemoglobin: 1.7 % — ABNORMAL HIGH (ref 0.5–1.5)
Carboxyhemoglobin: 1.5 % (ref 0.5–1.5)
Methemoglobin: <0.7 % (ref 0.0–1.5)
Methemoglobin: <0.7 % (ref 0.0–1.5)
O2 Saturation: 91.4 %
O2 Saturation: 80.9 %
Total hemoglobin: 14.9 g/dL (ref 12.0–16.0)
Total hemoglobin: 14.6 g/dL (ref 12.0–16.0)

## 2022-11-26 LAB — SODIUM, URINE, RANDOM: Sodium, Ur: <10 mmol/L

## 2022-11-26 LAB — URIC ACID, RANDOM URINE: Uric Acid, Urine: 52.9 mg/dL

## 2022-11-26 LAB — OSMOLALITY, URINE: Osmolality, Ur: 714 mosm/kg (ref 300–900)

## 2022-11-26 LAB — PHOSPHORUS: Phosphorus: 6.1 mg/dL — ABNORMAL HIGH (ref 2.5–4.6)

## 2022-11-26 LAB — CREATININE, URINE, RANDOM: Creatinine, Urine: 95 mg/dL

## 2022-11-26 LAB — LACTIC ACID, PLASMA: Lactic Acid, Venous: 2.1 mmol/L (ref 0.5–1.9)

## 2022-11-26 MED ORDER — ALBUMIN HUMAN 25 % IV SOLN: 12.5000 g | Freq: Once | INTRAVENOUS | Status: DC

## 2022-11-26 MED ORDER — SODIUM BICARBONATE 8.4 % IV SOLN
INTRAVENOUS | Status: AC
  Filled 2022-11-26 (×3): qty 1000

## 2022-11-26 MED ORDER — ROSUVASTATIN CALCIUM 20 MG PO TABS
40.0000 mg | ORAL_TABLET | Freq: Every evening | ORAL | Status: DC
  Administered 2022-11-26: 40 mg
  Filled 2022-11-26: qty 2

## 2022-11-26 MED ORDER — ROCURONIUM BROMIDE 10 MG/ML (PF) SYRINGE
80.0000 mg | PREFILLED_SYRINGE | Freq: Once | INTRAVENOUS | Status: AC
  Administered 2022-11-26: 80 mg via INTRAVENOUS

## 2022-11-26 MED ORDER — PANTOPRAZOLE SODIUM 40 MG IV SOLR: 40.0000 mg | Freq: Every day | INTRAVENOUS | Status: DC

## 2022-11-26 MED ORDER — MIDAZOLAM HCL 2 MG/2ML IJ SOLN
1.0000 mg | Freq: Once | INTRAMUSCULAR | Status: AC
  Administered 2022-11-26: 1 mg via INTRAVENOUS

## 2022-11-26 MED ORDER — PROSOURCE TF20 ENFIT COMPATIBL EN LIQD
60.0000 mL | Freq: Every day | ENTERAL | Status: DC
  Administered 2022-11-26 – 2022-11-29 (×4): 60 mL
  Filled 2022-11-26 (×4): qty 60

## 2022-11-26 MED ORDER — ACETAMINOPHEN 325 MG PO TABS: 650.0000 mg | ORAL_TABLET | Freq: Four times a day (QID) | ORAL | Status: DC | PRN

## 2022-11-26 MED ORDER — MIDAZOLAM HCL 2 MG/2ML IJ SOLN
4.0000 mg | Freq: Once | INTRAMUSCULAR | Status: AC
Start: 2022-11-26 — End: 2022-11-26
  Administered 2022-11-26: 4 mg via INTRAVENOUS
  Filled 2022-11-26: qty 4

## 2022-11-26 MED ORDER — EPINEPHRINE 1 MG/10ML IJ SOSY
PREFILLED_SYRINGE | INTRAMUSCULAR | Status: AC
  Filled 2022-11-26: qty 10

## 2022-11-26 MED ORDER — ARFORMOTEROL TARTRATE 15 MCG/2ML IN NEBU
15.0000 ug | INHALATION_SOLUTION | Freq: Two times a day (BID) | RESPIRATORY_TRACT | Status: DC
  Administered 2022-11-26 – 2022-11-30 (×8): 15 ug via RESPIRATORY_TRACT
  Filled 2022-11-26 (×8): qty 2

## 2022-11-26 MED ORDER — ORAL CARE MOUTH RINSE: 15.0000 mL | OROMUCOSAL | Status: DC | PRN

## 2022-11-26 MED ORDER — INSULIN ASPART 100 UNIT/ML IJ SOLN
0.0000 [IU] | INTRAMUSCULAR | Status: DC
  Administered 2022-11-27 (×2): 2 [IU] via SUBCUTANEOUS

## 2022-11-26 MED ORDER — ETOMIDATE 2 MG/ML IV SOLN
20.0000 mg | Freq: Once | INTRAVENOUS | Status: AC
  Administered 2022-11-26: 20 mg via INTRAVENOUS

## 2022-11-26 MED ORDER — ORAL CARE MOUTH RINSE: 15.0000 mL | OROMUCOSAL | Status: DC

## 2022-11-26 MED ORDER — FAMOTIDINE 20 MG PO TABS: 20.0000 mg | ORAL_TABLET | Freq: Two times a day (BID) | ORAL | Status: DC

## 2022-11-26 MED ORDER — NOREPINEPHRINE 16 MG/250ML-% IV SOLN
2.0000 ug/min | INTRAVENOUS | Status: DC
  Administered 2022-11-26: 5 ug/min via INTRAVENOUS
  Filled 2022-11-26: qty 250

## 2022-11-26 MED ORDER — SODIUM CHLORIDE 0.9 % IV SOLN
250.0000 mg | Freq: Two times a day (BID) | INTRAVENOUS | Status: DC
  Administered 2022-11-26 – 2022-11-27 (×3): 250 mg via INTRAVENOUS
  Filled 2022-11-26 (×4): qty 250

## 2022-11-26 MED ORDER — ROCURONIUM BROMIDE 10 MG/ML (PF) SYRINGE
PREFILLED_SYRINGE | INTRAVENOUS | Status: AC
  Filled 2022-11-26: qty 10

## 2022-11-26 MED ORDER — NOREPINEPHRINE 16 MG/250ML-% IV SOLN
0.0000 ug/min | INTRAVENOUS | Status: DC
  Administered 2022-11-27: 20 ug/min via INTRAVENOUS
  Administered 2022-11-28: 4 ug/min via INTRAVENOUS
  Administered 2022-11-29: 8 ug/min via INTRAVENOUS
  Administered 2022-11-30: 20.053 ug/min via INTRAVENOUS
  Filled 2022-11-26 (×4): qty 250

## 2022-11-26 MED ORDER — PERFLUTREN LIPID MICROSPHERE
1.0000 mL | INTRAVENOUS | Status: AC | PRN
  Administered 2022-11-26: 2 mL via INTRAVENOUS

## 2022-11-26 MED ORDER — DOBUTAMINE-DEXTROSE 4-5 MG/ML-% IV SOLN: 2.5000 ug/kg/min | INTRAVENOUS | Status: DC

## 2022-11-26 MED ORDER — ETOMIDATE 2 MG/ML IV SOLN
INTRAVENOUS | Status: AC
  Filled 2022-11-26: qty 10

## 2022-11-26 MED ORDER — EPINEPHRINE 0.1 MG/10ML (10 MCG/ML) SYRINGE FOR IV PUSH (FOR BLOOD PRESSURE SUPPORT)
30.0000 ug | PREFILLED_SYRINGE | INTRAVENOUS | Status: DC | PRN
  Filled 2022-11-26: qty 10

## 2022-11-26 MED ORDER — ATOVAQUONE 750 MG/5ML PO SUSP
1500.0000 mg | Freq: Every day | ORAL | Status: DC
  Administered 2022-11-26 – 2022-11-29 (×4): 1500 mg
  Filled 2022-11-26 (×5): qty 10

## 2022-11-26 MED ORDER — IOHEXOL 300 MG/ML  SOLN
50.0000 mL | Freq: Once | INTRAMUSCULAR | Status: AC | PRN
  Administered 2022-11-26: 25 mL

## 2022-11-26 MED ORDER — PROPOFOL 1000 MG/100ML IV EMUL: 0.0000 ug/kg/min | INTRAVENOUS | Status: DC

## 2022-11-26 MED ORDER — ALBUMIN HUMAN 5 % IV SOLN
12.5000 g | Freq: Once | INTRAVENOUS | Status: AC
  Administered 2022-11-26: 12.5 g via INTRAVENOUS
  Filled 2022-11-26: qty 250

## 2022-11-26 MED ORDER — PIVOT 1.5 CAL PO LIQD
1000.0000 mL | ORAL | Status: DC
  Administered 2022-11-26: 1000 mL

## 2022-11-26 MED ORDER — FENTANYL 2500MCG IN NS 250ML (10MCG/ML) PREMIX INFUSION
0.0000 ug/h | INTRAVENOUS | Status: DC
  Administered 2022-11-26 – 2022-11-27 (×2): 25 ug/h via INTRAVENOUS
  Administered 2022-11-28: 75 ug/h via INTRAVENOUS
  Administered 2022-11-29 (×2): 25 ug/h via INTRAVENOUS
  Filled 2022-11-26 (×2): qty 250

## 2022-11-26 MED ORDER — SODIUM CHLORIDE 0.9 % IV SOLN: 250.0000 mL | INTRAVENOUS | Status: DC

## 2022-11-26 MED ORDER — BUDESONIDE 0.5 MG/2ML IN SUSP
0.5000 mg | Freq: Two times a day (BID) | RESPIRATORY_TRACT | Status: DC
  Administered 2022-11-26 – 2022-11-30 (×8): 0.5 mg via RESPIRATORY_TRACT
  Filled 2022-11-26 (×8): qty 2

## 2022-11-26 MED ORDER — FENTANYL CITRATE PF 50 MCG/ML IJ SOSY
25.0000 ug | PREFILLED_SYRINGE | Freq: Once | INTRAMUSCULAR | Status: AC
  Administered 2022-11-26: 25 ug via INTRAVENOUS

## 2022-11-26 MED ORDER — FENTANYL CITRATE PF 50 MCG/ML IJ SOSY
PREFILLED_SYRINGE | INTRAMUSCULAR | Status: AC
  Filled 2022-11-26: qty 1

## 2022-11-26 MED ORDER — NOREPINEPHRINE 4 MG/250ML-% IV SOLN: 0.0000 ug/min | INTRAVENOUS | Status: DC

## 2022-11-26 MED ORDER — FAMOTIDINE 20 MG PO TABS
20.0000 mg | ORAL_TABLET | Freq: Two times a day (BID) | ORAL | Status: DC
  Administered 2022-11-26: 20 mg
  Filled 2022-11-26: qty 1

## 2022-11-26 MED ORDER — SODIUM CHLORIDE 0.9 % IV SOLN: INTRAVENOUS | Status: AC | PRN

## 2022-11-26 MED ORDER — ORAL CARE MOUTH RINSE
15.0000 mL | OROMUCOSAL | Status: DC | PRN
Start: 2022-11-26 — End: 2022-11-26

## 2022-11-26 MED ORDER — MEXILETINE HCL 150 MG PO CAPS
300.0000 mg | ORAL_CAPSULE | Freq: Two times a day (BID) | ORAL | Status: DC
  Administered 2022-11-26 – 2022-11-27 (×3): 300 mg
  Filled 2022-11-26 (×3): qty 2

## 2022-11-26 MED ORDER — EPINEPHRINE HCL 5 MG/250ML IV SOLN IN NS
0.5000 ug/min | INTRAVENOUS | Status: DC
  Administered 2022-11-26: 0.5 ug/min via INTRAVENOUS
  Administered 2022-11-29: 4.5 ug/min via INTRAVENOUS
  Administered 2022-11-30: 8.5 ug/min via INTRAVENOUS
  Filled 2022-11-26 (×3): qty 250

## 2022-11-26 MED ORDER — FENTANYL BOLUS VIA INFUSION
25.0000 ug | INTRAVENOUS | Status: DC | PRN
  Administered 2022-11-26 – 2022-11-27 (×3): 50 ug via INTRAVENOUS
  Administered 2022-11-27 (×2): 25 ug via INTRAVENOUS
  Administered 2022-11-27: 50 ug via INTRAVENOUS

## 2022-11-26 MED ORDER — SODIUM CHLORIDE 0.9% FLUSH
10.0000 mL | Freq: Three times a day (TID) | INTRAVENOUS | Status: DC
  Administered 2022-11-26 – 2022-11-30 (×11): 10 mL via INTRAPLEURAL

## 2022-11-26 MED ORDER — NOREPINEPHRINE 4 MG/250ML-% IV SOLN
2.0000 ug/min | INTRAVENOUS | Status: DC
  Administered 2022-11-26: 2 ug/min via INTRAVENOUS
  Filled 2022-11-26: qty 250

## 2022-11-26 MED ORDER — ETOMIDATE 2 MG/ML IV SOLN
20.0000 mg | Freq: Once | INTRAVENOUS | Status: DC
  Filled 2022-11-26: qty 10

## 2022-11-26 MED ORDER — VASOPRESSIN 20 UNITS/100 ML INFUSION FOR SHOCK
0.0000 [IU]/min | INTRAVENOUS | Status: DC
  Administered 2022-11-26 – 2022-11-28 (×5): 0.03 [IU]/min via INTRAVENOUS
  Administered 2022-11-29: 0.02 [IU]/min via INTRAVENOUS
  Filled 2022-11-26 (×6): qty 100

## 2022-11-26 MED ORDER — REVEFENACIN 175 MCG/3ML IN SOLN
175.0000 ug | Freq: Every day | RESPIRATORY_TRACT | Status: DC
  Administered 2022-11-27 – 2022-11-30 (×4): 175 ug via RESPIRATORY_TRACT
  Filled 2022-11-26 (×4): qty 3

## 2022-11-26 MED ORDER — ACETAMINOPHEN 650 MG RE SUPP
650.0000 mg | Freq: Four times a day (QID) | RECTAL | Status: DC | PRN
Start: 2022-11-26 — End: 2022-11-28

## 2022-11-26 MED ORDER — ORAL CARE MOUTH RINSE
15.0000 mL | OROMUCOSAL | Status: DC
  Administered 2022-11-26 – 2022-11-30 (×46): 15 mL via OROMUCOSAL

## 2022-11-26 MED ORDER — MIDAZOLAM HCL 2 MG/2ML IJ SOLN
INTRAMUSCULAR | Status: AC
  Filled 2022-11-26: qty 2

## 2022-11-26 NOTE — Procedures (Signed)
Insertion of Chest Tube Procedure Note  [name]  [id number]  02-09-1940  Date:11/26/22  Time:5:47 PM    Provider Performing: Cristopher Peru   Procedure: Chest Tube Insertion (32551)  Indication(s) Pneumothorax  Consent Risks of the procedure as well as the alternatives and risks of each were explained to the patient and/or caregiver.  Consent for the procedure was obtained and is signed in the bedside chart Joel Simmonds, NP spoke with pt son over the phone for consent   Anesthesia Topical only with 1% lidocaine    Time Out Verified patient identification, verified procedure, site/side was marked, verified correct patient position, special equipment/implants available, medications/allergies/relevant history reviewed, required imaging and test results available.   Sterile Technique Maximal sterile technique including full sterile barrier drape, hand hygiene, sterile gown, sterile gloves, mask, hair covering, sterile ultrasound probe cover (if used).   Procedure Description Ultrasound not used to identify appropriate pleural anatomy for placement and overlying skin marked. Area of placement cleaned and draped in sterile fashion.  A pigtail pleural catheter was placed into the right pleural space using Seldinger technique. Appropriate return of air was obtained.  The tube was connected to atrium and placed on -20 cm H2O wall suction.   Complications/Tolerance None; patient tolerated the procedure well. Chest X-ray is ordered to verify placement.   EBL Minimal  Specimen(s) none  Under direct supervision of Joel Simmonds, NP

## 2022-11-26 NOTE — Procedures (Signed)
Bronchoscopy Procedure Note  [name]  [id number]  07-28-1940  Date:11/26/22  Time:4:00 PM   Provider Performing:Derrian Rodak   Procedure(s):  Flexible bronchoscopy with bronchial alveolar lavage (91478)  Indication(s) ILD flare, rule out diffuse alveolar hemorrhage.  Consent Risks of the procedure as well as the alternatives and risks of each were explained to the patient and/or caregiver.  Consent for the procedure was obtained and is signed in the bedside chart  Anesthesia Dexmedetomidine, versed and fentanyl   Time Out Verified patient identification, verified procedure, site/side was marked, verified correct patient position, special equipment/implants available, medications/allergies/relevant history reviewed, required imaging and test results available.   Sterile Technique Usual hand hygiene, masks, gowns, and gloves were used   Procedure Description Bronchoscope advanced through endotracheal tube and into airway.  Airways were examined down to subsegmental level with findings noted below.   Following diagnostic evaluation, BAL(s) performed in 180 with normal saline and return of 60ml fluid  Findings: ectactic airways with black staining. Serial lavage with 30ml in right upper lobe showed increasingly bloody secretions    Lavage from superior segment only blood tinged. Will send for culture, cell count.    Complications/Tolerance Hypotension treated with fluids and increased vasopressors Chest X-ray is needed post procedure.   EBL Minimal   Specimen(s) Sent for culture, cell count and cytology   Lynnell Catalan, MD [facility] ICU Physician [facility] Critical Care  Pager: [phone] Or Epic Secure Chat After hours: [phone].  11/26/2022, 4:09 PM

## 2022-11-26 NOTE — Progress Notes (Addendum)
NAME:  [name], MRN:  [id number], DOB:  10-01-1940, LOS: 1 ADMISSION DATE:  11/24/2022, CONSULTATION DATE:  11/24/2022  REFERRING MD:  Dr. Alinda [name], CHIEF COMPLAINT:  hemoptysis    History of Present Illness:  Per Dr. Jane [name] recent office note. [name] 82 y.o. -he is here for an acute visit.  This because of hemoptysis.  He tells me that late in summer 2020 for his approximately September 2024 he had hemoptysis.  He was given Keflex and it went away but then it came back.  Nurse practitioner gave steroids it went away but now it is back since October 29, 2022.  Its present only 1 time early in the morning when he brings out his sputum.  He feels like the something behind his sternum that he has to cough out is there with mucus.  He is on Xarelto.  There is no change in the shortness of breath there is no fever.  He was recently given Keflex again but it does not help.  He brought in the tissues of his hemoptysis and plastic packet for me to see this.  He is really concerned about this.  He is wondering about repeat steroid course.  He is wondering about repeat antibiotic course.  He is also wondering about the etiology.   Patient was treated with doxycycline and steroids.  Doxycycline made him sick to his stomach and he could not take it.  He switched to Keflex.  He is still on Xarelto for A-fib.  He has had scant sputum production but his O2 sats were low so the decision today was come to the emergency room because he had O2 sats in the 70s.  He is now satting in the low 90s on 4 L.  This is a new oxygen requirement for him.  Pertinent  Medical History   Past Medical History:  Diagnosis Date   Adenomatous colon polyp 2009; 06/2012   Colonoscopy 07/2007---Dr. Maryjane [name] [facility]--Repeat 06/2012 showed one tubular adenoma w/out high grade dysplasia.   AICD (automatic cardioverter/defibrillator) present    Environmental manager   Atrial fibrillation (HCC)    Bilateral sensorineural hearing loss     BPH (benign prostatic hypertrophy)    PSAs ok (followed by urologist): no LUTS   CAD (coronary artery disease)    PTCA RCA 1997 (Inf/post MI);   CHF (congestive heart failure) (HCC)    Chronic constipation    Chronic rhinitis    ?vasomotor (Dr. Lazarus [name])   COPD (chronic obstructive pulmonary disease) (HCC)    Diverticulosis of colon 2005   Noted on Colonoscopies in 2005 and 2009 and on CT 2010   Gallstones 06/2014   No evidence of cholecystitis on abd u/s 06/2014   GERD (gastroesophageal reflux disease)    Hepatic steatosis 2010; 2016   Noted on CT abd 2010 and on u/s abd 04/2009 (transaminasemia) and u/s 06/2014.   Hyperlipidemia    Hypertension    Ischemic cardiomyopathy    Microscopic hematuria    Cysto neg 02/2009 except for BPH-likely has microhem from his renal stone dz   Nephrolithiasis    No distal stones on CT 2010; stable 8 mm RLP stone, 3mm LMP stone 10/2013 plain film.  Abd u/s 06/2014: 9 mm nonobstructing lower pole right renal stone.  03/2015 urol: stable nonobstructing renal calc by CT 12/2014 and KUB 03/2015.   Obstructive sleep apnea    no cpap uses since weight loss of 50 pounds   Presence of  permanent cardiac pacemaker    [employer]   Squamous cell carcinoma of back    Ventricular tachycardia (HCC)      Significant Hospital Events: Including procedures, antibiotic start and stop dates in addition to other pertinent events   11/10 admitted w/ hemoptysis and acute hypoxic resp failure. Sats 70s on room air. Moved to ICU given concern for high risk intubation., Started on CAP coverage. Made NPO for FOB. O2 needs up to 15 liters so Xarelto stopped 11/11 resp viral panel neg, covid neg. MRSA neg. Systemic steroids started. Sputum x 2 sent back w/ report not adequate specimen. His urine studies suggested hypovolemia vs ineffective renal perf. Not really overload. Had to be placed on NIPPV at night. Required precedex as seemed like sundowned. Inc'd solumed to 500 q 12.    Interim History / Subjective:  WOB increased. BIPAP over night   Objective   Blood pressure 100/64, pulse 62, temperature 97.9 F (36.6 C), temperature source Axillary, resp. rate (!) 26, height 5\' 10"  (1.778 m), weight 81.9 kg, SpO2 97%.    Vent Mode: PCV;BIPAP FiO2 (%):  [80 %-100 %] 80 % Set Rate:  [10 bmp-14 bmp] 14 bmp PEEP:  [6 cmH20] 6 cmH20   Intake/Output Summary (Last 24 hours) at 11/26/2022 0716 Last data filed at 11/26/2022 0700 Gross per 24 hour  Intake 1143.41 ml  Output 550 ml  Net 593.41 ml   Filed Weights   11/24/22 1039 11/25/22 0500 11/26/22 0500  Weight: 83.9 kg 80.7 kg 81.9 kg    Examination:  General this is a 82 year old male laying in bed. Just removed from NIPPV. His WOB is significant. He looks exhausted.  HENT NCAT no clear JVD Pulm 100% heated high flow on 35 lpm. Sats 88-90% crackles bases inc'd accessory use since yest Card reg irreg. No MRG. ECHO pending Ext no edema brisk CR Gu external foley in place. Dec'd UOP  Neuro awake, appropriate but gets agitated w/ NIPPV Resolved Hospital Problem list     Assessment & Plan:  Acute Hypoxic respiratory failure 2/2 Acute exacerbation of interstitial lung disease w/ Hemoptysis Can't exclude CAP vs DAH. Ileus complicating things as well.  ANA, HSP, Rf and all ILD markers have been neg in past CXR w/out much change. Also has ileus which clearly is decreasing cAbd.  Had to go on NIPPV last night. He did not tolerate this well from anxiety/ delirium standpoint.  Many of the sputum studies are still pending  Plan: Continue to alternate Heated high flow w/ PRN BIPAP Will leave dex as PRN for BIPAP, will check lacate on BIPAP if elevated I think we proceed to intubation. This would likely be time limited (no more than 10d if no improvement palliation)  Cont to titraet FIO2 for sats > 88% Will consider central access for CVP and SVO2 trend Day 3 ctx and azith Day 2 systemic steroids (will escalate to  [facility] dosing: 500mg /d divided doses) X 5 d then taper will need to add PCP prophylaxis, he's allergic to sulfa  Follow up pending Fungitell Beta-D-glucan, PCP, DFA,, sputum, AFB and fungal culture Cont triple combo nebs Cough suppression  If hemoptysis gets worse  give nebulzied TXA Xarelto placed on hold 11/10 Am CXR  Getting ECHO. Re-assessing urine studies.   Leukocytosis  Suspect exacerbated by steroids Plan F/u culture data Trend  H/o Afib, CAD, HLD, chronic combined HFrEF (30-35% EF), h/o VT, and HTN mild troponinemia  Plan Cont tele Hold coreg (  BP low) Cont rate control  Cont to hold [facility] Cont statin   Hyponatremia  had mixed picture he is hypo-osmolar but volume status perplexing. Neomia Dear suggested hypoperfused or volume depleted as did his urine Osmo. Got lasix yesterday. Na dropped further Plan Repeat Urine Na and Osmo now ECHO->need to better understand volume status May need to consider central access for CVP Hold off on lasix for now   Mild NAGMA Plan Supportive care [facility] to monitor   H/o TIA and cognitive impairment. Nocturnal delirium/sundowning (first noted 11/11) exacerbated by steroids, BIPAP etic   Plan Supportive care  Precedex on stand-bye   Hyperglycemia. Steroid induced Plan Ssi  Goal 140-180  Best Practice (right click and "Reselect all SmartList Selections" daily)   Diet/type: NPO w/ oral meds DVT prophylaxis: SCD GI prophylaxis: PPI Lines: N/A Foley:  N/A Code Status:  full code Last date of multidisciplinary goals of care discussion [pending]    My critical care time is 60 minutes  Simonne Martinet ACNP-BC [facility] Pulmonary/Critical Care Pager # [phone] OR # [phone] if no answer

## 2022-11-26 NOTE — Progress Notes (Signed)
BP requirement increased over afternoon Getting 2 bolus's of albumin.  Added vasopressin to support MAP and added epinephrine to support SCVO2 Developed right PTX this afternoon.  Stat CT placed anteriorly his ptx volume changed from 30% to less than 5.  Currently no further airleak  Re: vent pplats exceeding 34 on 7 cc/kg. I changed him to 6 cc/kg w/ freq 30. Abg evaluated w/ still failry sig resp acidosis. Ph 7.12 pco2 was in 70s.  Pao2 in 200 range so PEEP down to 8, fio2 down to 60. Changed RR to 32. Can't go any higher w/out sig air trapping.  Plan Cont current vasoactive gtts Currently on 1mg //min epi, 0.04 vasopressin and 22 mcg/min norepi. Will await f/u scvo2. As long as it is > 70 will hold epi Cont to wean Norepi for MAP > 65 Hold vasopressin at fixed dose Re: ventilator. Ve maximized. Will need to add bicarb support to achieve ph goal > 7.2 accepting some degree of hypercarbia.  CT in good place. Will cont flushing protocol  Has another bottle of albumin still to get.   My time 60 minutes critical care for on-going refractory cardiogenic shock and vasoplegia as well as acute HC and hypox resp failure requiring close bedside titration

## 2022-11-26 NOTE — Progress Notes (Signed)
Afternoon rounds  Now intubated Ve adjusted for post-intubation ABG CVP 9 co-ox 90 Currently maintaining MAP goal w/ NE at 5 mcg/min Sedated on dex and fent  NGT now placed by radiology   Plan Cont keep euvolemic MAP goal >65 Hold on further diuresis (urine studies/ flat internal jugular on assessment, and CVP 8-9 suggest more volume depleted than overloaded.  Cont high dose systemic steroids Adding PCP prophylaxis  BAL pending   Simonne Martinet ACNP-BC [facility] Pulmonary/Critical Care Pager # [phone] OR # [phone] if no answer

## 2022-11-26 NOTE — Plan of Care (Signed)
  Problem: Clinical Measurements: Goal: Diagnostic test results will improve Outcome: Not Progressing Goal: Respiratory complications will improve Outcome: Not Progressing Goal: Cardiovascular complication will be avoided Outcome: Not Progressing   Problem: Nutrition: Goal: Adequate nutrition will be maintained Outcome: Progressing

## 2022-11-26 NOTE — Procedures (Signed)
Intubation Procedure Note  [name]  [id number]  19-Mar-1940  Date:11/26/22  Time:1:40 PM   Provider Performing:Pete E Tanja Port    Procedure: Intubation (31500)  Indication(s) Respiratory Failure  Consent Risks of the procedure as well as the alternatives and risks of each were explained to the patient and/or caregiver.  Consent for the procedure was obtained and is signed in the bedside chart   Anesthesia Etomidate, Versed, Fentanyl, and Rocuronium   Time Out Verified patient identification, verified procedure, site/side was marked, verified correct patient position, special equipment/implants available, medications/allergies/relevant history reviewed, required imaging and test results available.   Sterile Technique Usual hand hygeine, masks, and gloves were used   Procedure Description Patient positioned in bed supine.  Sedation given as noted above.  Patient was intubated with endotracheal tube using Glidescope.  View was Grade 1 full glottis .  Number of attempts was 1.  Colorimetric CO2 detector was consistent with tracheal placement.   Complications/Tolerance None; patient tolerated the procedure well. Chest X-ray is ordered to verify placement. Had transiet hypotension w/ SBP as low as 75 responding immediately to 30 mcg epi IV. Required 2 total doses. NE gtt titrated up to 10 to sustain hemodynamics  EBL Minimal   Specimen(s) None

## 2022-11-26 NOTE — Procedures (Signed)
Central Venous Catheter Insertion Procedure Note  HERON CORFIELD  [id number]  13-Feb-1940  Date:11/26/22  Time:1:42 PM   Provider Performing:Pete Bea Laura Tanja Port   Procedure: Insertion of Non-tunneled Central Venous 361 [id number]) with US guidance (01093)   Indication(s) Medication administration and Difficult access  Consent Risks of the procedure as well as the alternatives and risks of each were explained to the patient and/or caregiver.  Consent for the procedure was obtained and is signed in the bedside chart  Anesthesia Topical only with 1% lidocaine   Timeout Verified patient identification, verified procedure, site/side was marked, verified correct patient position, special equipment/implants available, medications/allergies/relevant history reviewed, required imaging and test results available.  Sterile Technique Maximal sterile technique including full sterile barrier drape, hand hygiene, sterile gown, sterile gloves, mask, hair covering, sterile ultrasound probe cover (if used).  Procedure Description Area of catheter insertion was cleaned with chlorhexidine and draped in sterile fashion.  With real-time ultrasound guidance a central venous catheter was placed into the right internal jugular vein. Nonpulsatile blood flow and easy flushing noted in all ports.  The catheter was sutured in place and sterile dressing applied.  Complications/Tolerance None; patient tolerated the procedure well. Chest X-ray is ordered to verify placement for internal jugular or subclavian cannulation.   Chest x-ray is not ordered for femoral cannulation.  EBL Minimal  Specimen(s) None

## 2022-11-26 NOTE — Procedures (Signed)
Arterial Line Insertion Start/End11/12/2022 6:16 PM  Preanesthetic checklist: patient identified, risks and benefits discussed, surgical consent and monitors and equipment checked Left, radial was placed Maximum sterile barriers used   Attempts: 2 Procedure performed without using ultrasound guided technique. Following insertion, Biopatch and dressing applied. Post procedure assessment: normal  Patient tolerated the procedure well with no immediate complications. Additional procedure comments: RT x2 Placed aline w/o complications per provider.

## 2022-11-26 NOTE — Telephone Encounter (Signed)
Called the pt and there was no answer- LMTCB    

## 2022-11-27 ENCOUNTER — Telehealth: Payer: Self-pay | Admitting: Internal Medicine

## 2022-11-27 ENCOUNTER — Inpatient Hospital Stay (HOSPITAL_COMMUNITY): Payer: Medicare Other

## 2022-11-27 DIAGNOSIS — R0489 Hemorrhage from other sites in respiratory passages: Secondary | ICD-10-CM

## 2022-11-27 DIAGNOSIS — J984 Other disorders of lung: Secondary | ICD-10-CM | POA: Diagnosis not present

## 2022-11-27 DIAGNOSIS — J9601 Acute respiratory failure with hypoxia: Secondary | ICD-10-CM | POA: Diagnosis not present

## 2022-11-27 DIAGNOSIS — J939 Pneumothorax, unspecified: Secondary | ICD-10-CM | POA: Diagnosis not present

## 2022-11-27 DIAGNOSIS — J189 Pneumonia, unspecified organism: Secondary | ICD-10-CM

## 2022-11-27 DIAGNOSIS — R042 Hemoptysis: Secondary | ICD-10-CM | POA: Diagnosis not present

## 2022-11-27 DIAGNOSIS — Z95810 Presence of automatic (implantable) cardiac defibrillator: Secondary | ICD-10-CM | POA: Diagnosis not present

## 2022-11-27 DIAGNOSIS — E440 Moderate protein-calorie malnutrition: Secondary | ICD-10-CM | POA: Insufficient documentation

## 2022-11-27 DIAGNOSIS — Z4682 Encounter for fitting and adjustment of non-vascular catheter: Secondary | ICD-10-CM | POA: Diagnosis not present

## 2022-11-27 LAB — CBC
HCT: 38.7 % — ABNORMAL LOW (ref 39.0–52.0)
Hemoglobin: 13.1 g/dL (ref 13.0–17.0)
MCH: 34.5 pg — ABNORMAL HIGH (ref 26.0–34.0)
MCHC: 33.9 g/dL (ref 30.0–36.0)
MCV: 101.8 fL — ABNORMAL HIGH (ref 80.0–100.0)
Platelets: 93 10*3/uL — ABNORMAL LOW (ref 150–400)
RBC: 3.8 MIL/uL — ABNORMAL LOW (ref 4.22–5.81)
RDW: 13.6 % (ref 11.5–15.5)
WBC: 10.8 10*3/uL — ABNORMAL HIGH (ref 4.0–10.5)
nRBC: 0 % (ref 0.0–0.2)

## 2022-11-27 LAB — POCT I-STAT 7, (LYTES, BLD GAS, ICA,H+H)
Acid-Base Excess: 0 mmol/L (ref 0.0–2.0)
Acid-Base Excess: 2 mmol/L (ref 0.0–2.0)
Acid-base deficit: 4 mmol/L — ABNORMAL HIGH (ref 0.0–2.0)
Bicarbonate: 21.9 mmol/L (ref 20.0–28.0)
Bicarbonate: 27.1 mmol/L (ref 20.0–28.0)
Bicarbonate: 28.5 mmol/L — ABNORMAL HIGH (ref 20.0–28.0)
Calcium, Ion: 1.09 mmol/L — ABNORMAL LOW (ref 1.15–1.40)
Calcium, Ion: 1.09 mmol/L — ABNORMAL LOW (ref 1.15–1.40)
Calcium, Ion: 1.15 mmol/L (ref 1.15–1.40)
HCT: 35 % — ABNORMAL LOW (ref 39.0–52.0)
HCT: 37 % — ABNORMAL LOW (ref 39.0–52.0)
HCT: 40 % (ref 39.0–52.0)
Hemoglobin: 11.9 g/dL — ABNORMAL LOW (ref 13.0–17.0)
Hemoglobin: 12.6 g/dL — ABNORMAL LOW (ref 13.0–17.0)
Hemoglobin: 13.6 g/dL (ref 13.0–17.0)
O2 Saturation: 97 %
O2 Saturation: 98 %
O2 Saturation: 98 %
Patient temperature: 36.1
Patient temperature: 36.4
Patient temperature: 98.6
Potassium: 4.2 mmol/L (ref 3.5–5.1)
Potassium: 4.5 mmol/L (ref 3.5–5.1)
Potassium: 4.9 mmol/L (ref 3.5–5.1)
Sodium: 127 mmol/L — ABNORMAL LOW (ref 135–145)
Sodium: 128 mmol/L — ABNORMAL LOW (ref 135–145)
Sodium: 129 mmol/L — ABNORMAL LOW (ref 135–145)
TCO2: 23 mmol/L (ref 22–32)
TCO2: 29 mmol/L (ref 22–32)
TCO2: 30 mmol/L (ref 22–32)
pCO2 arterial: 39.6 mm[Hg] (ref 32–48)
pCO2 arterial: 49.8 mm[Hg] — ABNORMAL HIGH (ref 32–48)
pCO2 arterial: 50.5 mm[Hg] — ABNORMAL HIGH (ref 32–48)
pH, Arterial: 7.333 — ABNORMAL LOW (ref 7.35–7.45)
pH, Arterial: 7.348 — ABNORMAL LOW (ref 7.35–7.45)
pH, Arterial: 7.365 (ref 7.35–7.45)
pO2, Arterial: 104 mm[Hg] (ref 83–108)
pO2, Arterial: 107 mm[Hg] (ref 83–108)
pO2, Arterial: 90 mm[Hg] (ref 83–108)

## 2022-11-27 LAB — BRAIN NATRIURETIC PEPTIDE: B Natriuretic Peptide: 859.3 pg/mL — ABNORMAL HIGH (ref 0.0–100.0)

## 2022-11-27 LAB — COMPREHENSIVE METABOLIC PANEL
ALT: 368 U/L — ABNORMAL HIGH (ref 0–44)
ALT: 1077 U/L — ABNORMAL HIGH (ref 0–44)
AST: 510 U/L — ABNORMAL HIGH (ref 15–41)
AST: 1803 U/L — ABNORMAL HIGH (ref 15–41)
Albumin: 2.6 g/dL — ABNORMAL LOW (ref 3.5–5.0)
Albumin: 2.6 g/dL — ABNORMAL LOW (ref 3.5–5.0)
Alkaline Phosphatase: 76 U/L (ref 38–126)
Alkaline Phosphatase: 75 U/L (ref 38–126)
Anion gap: 14 (ref 5–15)
Anion gap: 12 (ref 5–15)
BUN: 52 mg/dL — ABNORMAL HIGH (ref 8–23)
BUN: 73 mg/dL — ABNORMAL HIGH (ref 8–23)
CO2: 20 mmol/L — ABNORMAL LOW (ref 22–32)
CO2: 25 mmol/L (ref 22–32)
Calcium: 8.2 mg/dL — ABNORMAL LOW (ref 8.9–10.3)
Calcium: 7.7 mg/dL — ABNORMAL LOW (ref 8.9–10.3)
Chloride: 92 mmol/L — ABNORMAL LOW (ref 98–111)
Chloride: 89 mmol/L — ABNORMAL LOW (ref 98–111)
Creatinine, Ser: 1.75 mg/dL — ABNORMAL HIGH (ref 0.61–1.24)
Creatinine, Ser: 2.39 mg/dL — ABNORMAL HIGH (ref 0.61–1.24)
GFR, Estimated: 39 mL/min — ABNORMAL LOW (ref >60)
GFR, Estimated: 27 mL/min — ABNORMAL LOW (ref >60)
Glucose, Bld: 356 mg/dL — ABNORMAL HIGH (ref 70–99)
Glucose, Bld: 313 mg/dL — ABNORMAL HIGH (ref 70–99)
Potassium: 5 mmol/L (ref 3.5–5.1)
Potassium: 4.3 mmol/L (ref 3.5–5.1)
Sodium: 126 mmol/L — ABNORMAL LOW (ref 135–145)
Sodium: 126 mmol/L — ABNORMAL LOW (ref 135–145)
Total Bilirubin: 1.1 mg/dL (ref <1.2)
Total Bilirubin: 0.9 mg/dL (ref <1.2)
Total Protein: 5.3 g/dL — ABNORMAL LOW (ref 6.5–8.1)
Total Protein: 5 g/dL — ABNORMAL LOW (ref 6.5–8.1)

## 2022-11-27 LAB — GLUCOSE, CAPILLARY
Glucose-Capillary: 209 mg/dL — ABNORMAL HIGH (ref 70–99)
Glucose-Capillary: 242 mg/dL — ABNORMAL HIGH (ref 70–99)
Glucose-Capillary: 268 mg/dL — ABNORMAL HIGH (ref 70–99)
Glucose-Capillary: 285 mg/dL — ABNORMAL HIGH (ref 70–99)
Glucose-Capillary: 375 mg/dL — ABNORMAL HIGH (ref 70–99)
Glucose-Capillary: 377 mg/dL — ABNORMAL HIGH (ref 70–99)

## 2022-11-27 LAB — LEGIONELLA PNEUMOPHILA SEROGP 1 UR AG: L. pneumophila Serogp 1 Ur Ag: NEGATIVE

## 2022-11-27 LAB — MAGNESIUM
Magnesium: 2.5 mg/dL — ABNORMAL HIGH (ref 1.7–2.4)
Magnesium: 2.7 mg/dL — ABNORMAL HIGH (ref 1.7–2.4)

## 2022-11-27 LAB — PROCALCITONIN: Procalcitonin: 1.71 ng/mL

## 2022-11-27 LAB — PHOSPHORUS
Phosphorus: 5.4 mg/dL — ABNORMAL HIGH (ref 2.5–4.6)
Phosphorus: 4.9 mg/dL — ABNORMAL HIGH (ref 2.5–4.6)

## 2022-11-27 MED ORDER — INSULIN GLARGINE-YFGN 100 UNIT/ML ~~LOC~~ SOLN
10.0000 [IU] | Freq: Two times a day (BID) | SUBCUTANEOUS | Status: DC
  Administered 2022-11-27: 10 [IU] via SUBCUTANEOUS
  Filled 2022-11-27 (×3): qty 0.1

## 2022-11-27 MED ORDER — SODIUM CHLORIDE 0.9 % IV SOLN
250.0000 mg | Freq: Two times a day (BID) | INTRAVENOUS | Status: AC
  Administered 2022-11-27 – 2022-11-28 (×3): 250 mg via INTRAVENOUS
  Filled 2022-11-27 (×3): qty 250

## 2022-11-27 MED ORDER — GUAIFENESIN-DM 100-10 MG/5ML PO SYRP: 5.0000 mL | ORAL_SOLUTION | ORAL | Status: DC | PRN

## 2022-11-27 MED ORDER — METHYLPREDNISOLONE SODIUM SUCC 40 MG IJ SOLR: 20.0000 mg | Freq: Every day | INTRAMUSCULAR | Status: DC

## 2022-11-27 MED ORDER — FENTANYL CITRATE PF 50 MCG/ML IJ SOSY
100.0000 ug | PREFILLED_SYRINGE | Freq: Once | INTRAMUSCULAR | Status: AC
  Administered 2022-11-27: 100 ug via INTRAVENOUS

## 2022-11-27 MED ORDER — MIDAZOLAM HCL 2 MG/2ML IJ SOLN: 1.0000 mg | INTRAMUSCULAR | Status: DC | PRN

## 2022-11-27 MED ORDER — PIVOT 1.5 CAL PO LIQD
1000.0000 mL | ORAL | Status: DC
  Administered 2022-11-27 – 2022-11-29 (×3): 1000 mL

## 2022-11-27 MED ORDER — METHYLPREDNISOLONE SODIUM SUCC 125 MG IJ SOLR
80.0000 mg | Freq: Every day | INTRAMUSCULAR | Status: DC
  Administered 2022-11-29: 80 mg via INTRAVENOUS
  Filled 2022-11-27: qty 2

## 2022-11-27 MED ORDER — QUETIAPINE FUMARATE 25 MG PO TABS
25.0000 mg | ORAL_TABLET | Freq: Two times a day (BID) | ORAL | Status: DC
  Administered 2022-11-27 – 2022-11-29 (×5): 25 mg
  Filled 2022-11-27 (×6): qty 1

## 2022-11-27 MED ORDER — POLYETHYLENE GLYCOL 3350 17 G PO PACK: 17.0000 g | PACK | Freq: Every day | ORAL | Status: DC | PRN

## 2022-11-27 MED ORDER — QUETIAPINE FUMARATE 25 MG PO TABS: 25.0000 mg | ORAL_TABLET | Freq: Two times a day (BID) | ORAL | Status: DC

## 2022-11-27 MED ORDER — METHYLPREDNISOLONE SODIUM SUCC 125 MG IJ SOLR: 60.0000 mg | Freq: Every day | INTRAMUSCULAR | Status: DC

## 2022-11-27 MED ORDER — INSULIN ASPART 100 UNIT/ML IJ SOLN
0.0000 [IU] | INTRAMUSCULAR | Status: DC
  Administered 2022-11-27: 9 [IU] via SUBCUTANEOUS
  Administered 2022-11-27: 3 [IU] via SUBCUTANEOUS
  Administered 2022-11-27: 5 [IU] via SUBCUTANEOUS
  Administered 2022-11-27: 9 [IU] via SUBCUTANEOUS
  Administered 2022-11-28: 3 [IU] via SUBCUTANEOUS
  Administered 2022-11-28: 5 [IU] via SUBCUTANEOUS
  Administered 2022-11-28: 3 [IU] via SUBCUTANEOUS
  Administered 2022-11-28: 2 [IU] via SUBCUTANEOUS
  Administered 2022-11-28 – 2022-11-29 (×2): 3 [IU] via SUBCUTANEOUS
  Administered 2022-11-29: 5 [IU] via SUBCUTANEOUS
  Administered 2022-11-29: 2 [IU] via SUBCUTANEOUS
  Administered 2022-11-29 – 2022-11-30 (×4): 3 [IU] via SUBCUTANEOUS
  Administered 2022-11-30: 2 [IU] via SUBCUTANEOUS
  Administered 2022-11-30: 3 [IU] via SUBCUTANEOUS

## 2022-11-27 MED ORDER — INSULIN ASPART 100 UNIT/ML IJ SOLN
2.0000 [IU] | INTRAMUSCULAR | Status: DC
  Administered 2022-11-27 (×3): 2 [IU] via SUBCUTANEOUS

## 2022-11-27 MED ORDER — METHYLPREDNISOLONE SODIUM SUCC 40 MG IJ SOLR: 40.0000 mg | Freq: Every day | INTRAMUSCULAR | Status: DC

## 2022-11-27 MED ORDER — INSULIN ASPART 100 UNIT/ML IJ SOLN
4.0000 [IU] | INTRAMUSCULAR | Status: DC
  Administered 2022-11-27 – 2022-11-29 (×10): 4 [IU] via SUBCUTANEOUS

## 2022-11-27 MED ORDER — ALBUMIN HUMAN 5 % IV SOLN
25.0000 g | Freq: Once | INTRAVENOUS | Status: AC
  Administered 2022-11-27: 25 g via INTRAVENOUS
  Filled 2022-11-27: qty 500

## 2022-11-27 MED ORDER — INSULIN GLARGINE-YFGN 100 UNIT/ML ~~LOC~~ SOLN
10.0000 [IU] | Freq: Every day | SUBCUTANEOUS | Status: DC
  Administered 2022-11-27: 10 [IU] via SUBCUTANEOUS
  Filled 2022-11-27: qty 0.1

## 2022-11-27 MED ORDER — FAMOTIDINE 20 MG PO TABS
20.0000 mg | ORAL_TABLET | Freq: Every day | ORAL | Status: DC
  Administered 2022-11-27 – 2022-11-28 (×2): 20 mg
  Filled 2022-11-27 (×2): qty 1

## 2022-11-27 MED FILL — Sodium Chloride IV Soln 0.9%: INTRAVENOUS | Qty: 250 | Status: AC

## 2022-11-27 MED FILL — Fentanyl Citrate Preservative Free (PF) Inj 2500 MCG/50ML: INTRAMUSCULAR | Qty: 50 | Status: AC

## 2022-11-27 NOTE — Telephone Encounter (Signed)
Spoke with patient's son Will regarding patient being in the hospital in ICU and want's Dr.Ramaswamy to look over notes .   Dr.Ramaswamy can you please look at the note's when you get a chance .   Thank you

## 2022-11-27 NOTE — Telephone Encounter (Signed)
Patient is in the hospital, son Chrissie Noa is returning call. Please call back at [phone].

## 2022-11-27 NOTE — Telephone Encounter (Signed)
   Spoke to his son [name]  5:55 PM 11/27/2022 fter d/w DR Renae Fickle and Anders Simmonds   Patient had normal overnight pulse oximetry on 11/11/2022.  He even had dinner with the family night before admission.  And seems at baseline.  Sudden decline with respiratory groundglass opacities and being on the ventilator.  Diffuse groundglass opacities predominantly upper lobe seen on CT chest.  This is in the background of declining lung function this year in setting of his IPF.  These features are very consistent with pulmonary fibrosis flareup.  The alveolar hemorrhage could be because of the anticoagulation.  Vasculitis panel was normal 2 years ago.  I do support the diagnosis of alveolar hemorrhage and the current treatment for steroids empiric that can also use an IPF flare.  Did discuss the fact of diagnosis IPF flareup then the prognosis is very grim with 90% mortality rate at 3 months and 50% mortality rate in few weeks and in the case of [name] the prognosis even worse because of his poor ejection fraction.   Did express support and alignment to giving time-limited trial over the course of the next week or so to see how he does and how he responds to the steroids.  Recommended no CPR.  The son did indicate that patient does not want to be on a long-term ventilator situation.      SIGNATURE    Dr. Kalman Shan, M.D., F.C.C.P,  Pulmonary and Critical Care Medicine Staff Physician, [facility] Director - Interstitial Lung Disease  Program  Pulmonary Fibrosis [facility] at [facility] [place], [place], [address]   Pager: [phone], If no answer  -> Check AMION or Try [phone] Telephone (clinical office): [phone] Telephone (research): [phone]  5:58 PM 11/27/2022

## 2022-11-27 NOTE — Telephone Encounter (Signed)
Son is requesting Dr. Marchelle Gearing review ICU notes

## 2022-11-27 NOTE — Telephone Encounter (Signed)
Overnight pulse oximetry 11/11/2022 time spent less than or equal to 88% was 2 minutes and 13 seconds  Plan - No need for night oxygen [test was done on November 11, 2022]

## 2022-11-27 NOTE — Progress Notes (Signed)
Initial Nutrition Assessment  DOCUMENTATION CODES:   Non-severe (moderate) malnutrition in context of chronic illness  INTERVENTION:  TF via NGT: Recommend increasing Pivot 1.5 to 12ml/hr and then by 10ml Q8h to a goal rate of 20ml/h (1320 ml per day)  TF at goal provides 1980 kcal, 123 gm protein, 990 ml free water daily  NUTRITION DIAGNOSIS:   Moderate Malnutrition related to chronic illness (HF, COPD, CAD, IPF) as evidenced by mild fat depletion, moderate muscle depletion, severe muscle depletion.  GOAL:   Patient will meet greater than or equal to 90% of their needs  MONITOR:   Vent status, Labs, Weight trends, I & O's, TF tolerance  REASON FOR ASSESSMENT:   Consult Enteral/tube feeding initiation and management  ASSESSMENT:   Pt admitted with hemoptysis and acute hypoxic respiratory failure. PMH significant for HTN, HLD, GERD, MCI, OSA, TIA, CAD, afib, CHF, IPF, COPD, V tach.   11/10 admitted 11/12 intubated; s/p bronchoscopy findings c/w DAH; chest tube insertion  Pt remains intubated and sedation on vent support.   NGT in place (tip within duodenum) Pivot 1.5 infusing at 23ml/hr. Reached out to CCM regarding TF advancement; who agrees to titration.   No family present at time of visit to obtain nutrition related history from.   Chest tube in place with minimal output.   Review of weight history over the last year reflects pt's weight to fluctuate between 83-86 kg.   Between 11/11-11/13, pt's weight documented to be +7.4 kg. Question accuracy of bed weight.  I/O's reflect pt +3.4L  Pt meets criteria for moderate malnutrition based on moderate fat and muscle deficits; however observed severe lower extremity muscle wasting which could indicate more severe degree of malnutrition however given lack of nutrition related history, cannot confirm at this time.   Pt noted to have steroid induced hyperglycemia. CCM adjusted insulin regimen today. Will continue to  monitor with titration of TF.   Medications: pepcid, SSI 0-9 units q4h, SSI 2 units q4h, semglee 10 units daily Drips:  Abx Precedex Epi @ 79mcg/min Solu-medrol Levo @ 30mcg/min Vaso @ 0.03 units/min  Labs: sodium 127, BUN 52, Cr 1.75, phos 5.4, Mg 2.5, AST 510, ALT 368, GFR 39, CBG's 175-377 x24 hours  UOP: x24 hours  NUTRITION - FOCUSED PHYSICAL EXAM:  Flowsheet Row Most Recent Value  Orbital Region Severe depletion  Upper Arm Region Moderate depletion  Thoracic and Lumbar Region Mild depletion  Buccal Region Unable to assess  Temple Region Moderate depletion  Clavicle Bone Region Moderate depletion  Clavicle and Acromion Bone Region Moderate depletion  Scapular Bone Region Moderate depletion  Dorsal Hand Moderate depletion  Patellar Region Severe depletion  Anterior Thigh Region Severe depletion  Posterior Calf Region Severe depletion  Edema (RD Assessment) None  Hair Reviewed  Eyes Unable to assess  Mouth Reviewed  Skin Reviewed  Nails Other (Comment)  [blue nailbeds]       Diet Order:   Diet Order             Diet NPO time specified  Diet effective now                   EDUCATION NEEDS:   No education needs have been identified at this time  Skin:  Skin Assessment: Reviewed RN Assessment  Last BM:  11/11  Height:   Ht Readings from Last 1 Encounters:  11/24/22 5\' 10"  (1.778 m)    Weight:   Wt Readings from Last 1  Encounters:  11/27/22 87.4 kg   BMI:  Body mass index is 27.65 kg/m.  Estimated Nutritional Needs:   Kcal:  2000-2200  Protein:  115-130g  Fluid:  >/=2L  Drusilla Kanner, RDN, LDN Clinical Nutrition

## 2022-11-27 NOTE — Progress Notes (Signed)
Remote ICD transmission.   

## 2022-11-27 NOTE — Progress Notes (Signed)
NAME:  [name], MRN:  [id number], DOB:  [birth year], LOS: 2 ADMISSION DATE:  11/24/2022, CONSULTATION DATE:  11/24/2022  REFERRING MD: Alinda Money - TRH, CHIEF COMPLAINT: Cough, SOB, hemoptysis  History of Present Illness:  82 year old man who presented to [facility] 11/10 for hemoptysis. PMHx significant for HTN, HLD, CAD, Afib (on Xarelto), VT, CHF (Echo 05/2021 EF 30-35%, G1DD), ICM with pacemaker/AICD in place, OSA, COPD, ILD (UIP), TIA, GERD. Recently seen by [facility] (Dr. Marchelle Gearing) 10/22 for hemoptysis, persistent SOB/cough since 08/2022.   Presented to [facility] ED 11/10 for persistent SOB/cough with hemoptysis, recently treated with steroids/antibiotics (doxy, then Keflex). SpO2 in ED initially 78% on RA. Hypotensive with SBP 90s-100s. Labs were notable for WBC 11.6, Hgb 14.7, Plt 244. Na 131, K 4.8, CO2 20, Cr 0.92. BNP 477. Mild troponin leak (27), COVID/Flu negative. CXR with ?pulmonary edema, acute-on-chronic changes. CTA Chest PE protocol negative for PE, +diffuse GGOs with septal thickening, ?atypical infection versus DAH. Broad-spectrum antibiotics started. Initially management with HHFNC, BiPAP (limited by anxiety), ultimately requited intubation 11/12.  Patient remains critically ill in ICU.  Pertinent  Medical History   Past Medical History:  Diagnosis Date   Adenomatous colon polyp 2009; 06/2012   Colonoscopy 07/2007---Dr. Maryjane Hurter GI--Repeat 06/2012 showed one tubular adenoma w/out high grade dysplasia.   AICD (automatic cardioverter/defibrillator) present    Environmental manager   Atrial fibrillation (HCC)    Bilateral sensorineural hearing loss    BPH (benign prostatic hypertrophy)    PSAs ok (followed by urologist): no LUTS   CAD (coronary artery disease)    PTCA RCA 1997 (Inf/post MI);   CHF (congestive heart failure) (HCC)    Chronic constipation    Chronic rhinitis    ?vasomotor (Dr. Lazarus Salines)   COPD (chronic obstructive pulmonary disease) (HCC)    Diverticulosis of colon 2005    Noted on Colonoscopies in 2005 and 2009 and on CT 2010   Gallstones 06/2014   No evidence of cholecystitis on abd u/s 06/2014   GERD (gastroesophageal reflux disease)    Hepatic steatosis 2010; 2016   Noted on CT abd 2010 and on u/s abd 04/2009 (transaminasemia) and u/s 06/2014.   Hyperlipidemia    Hypertension    Ischemic cardiomyopathy    Microscopic hematuria    Cysto neg 02/2009 except for BPH-likely has microhem from his renal stone dz   Nephrolithiasis    No distal stones on CT 2010; stable 8 mm RLP stone, 3mm LMP stone 10/2013 plain film.  Abd u/s 06/2014: 9 mm nonobstructing lower pole right renal stone.  03/2015 urol: stable nonobstructing renal calc by CT 12/2014 and KUB 03/2015.   Obstructive sleep apnea    no cpap uses since weight loss of 50 pounds   Presence of permanent cardiac pacemaker    [employer]   Squamous cell carcinoma of back    Ventricular tachycardia (HCC)     Significant Hospital Events: Including procedures, antibiotic start and stop dates in addition to other pertinent events   11/10 Admitted w/ hemoptysis and acute hypoxic resp failure. Sats 70s on room air. Moved to ICU given concern for high risk intubation., Started on CAP coverage. Made NPO for FOB. O2 needs up to 15 liters so Xarelto stopped 11/11 Resp viral panel neg, covid neg. MRSA neg. Systemic steroids started. Sputum x 2 sent back w/ report not adequate specimen. His urine studies suggested hypovolemia vs ineffective renal perf. Not really overload. Had to be placed on NIPPV at night.  Required precedex as seemed like sundowned. Inc'd solumed to 500 q 12.  11/12 - Intubated, RIJ CVC placed. Bronch completed with increasingly bloody secretions (RUL). Superior segment blood-tinged. R anterior pigtail chest tube placed for 20-30% R PTX. A-line inserted. Epi and vaso started.  Interim History / Subjective:  No significant events overnight Weaned sedation this AM, +vent dyssynchrony and RR 40s Sats  marginal, low 90s Better on PSV 5/5 Not yet waking up/following commands CT output 10mL, no air leak  Objective:  Blood pressure (!) 124/57, pulse 65, temperature 97.6 F (36.4 C), temperature source Axillary, resp. rate (!) 21, height 5\' 10"  (1.778 m), weight 87.4 kg, SpO2 96%. CVP:  [0 mmHg-23 mmHg] 2 mmHg  Vent Mode: PRVC FiO2 (%):  [40 %-100 %] 40 % Set Rate:  [22 bmp-32 bmp] 32 bmp Vt Set:  [430 mL-510 mL] 430 mL PEEP:  [5 cmH20-10 cmH20] 5 cmH20 Plateau Pressure:  [23 cmH20-33 cmH20] 23 cmH20   Intake/Output Summary (Last 24 hours) at 11/27/2022 0728 Last data filed at 11/27/2022 0700 Gross per 24 hour  Intake 2862.78 ml  Output 310 ml  Net 2552.78 ml   Filed Weights   11/25/22 0500 11/26/22 0500 11/27/22 0500  Weight: 80.7 kg 81.9 kg 87.4 kg   Physical Examination: General: Acutely ill-appearing elderly man in NAD. HEENT: [place]/AT, anicteric sclera, PERRL 2mm, moist mucous membranes. ETT in place. Neuro: Sedated. Unresponsive when sedation weaned. Does not respond to verbal, tactile or noxious stimuli. Withdraws to pain readily in BLE, no withdrawal in BUE. Not following commands. No spontaneous movement of extremities noted. +Corneal, +Cough, and +Gag  CV: RRR paced, no m/g/r. PULM: Breathing mildly dyssynchronous and tachypneic to 40s on vent. Lung fields diminished throughout. GI: Soft, nontender, nondistended. Hypoactive bowel sounds. Extremities: No LE edema noted. Skin: Warm/dry, no rashes.  Resolved Hospital Problem List:    Assessment & Plan:  Acute Hypoxic respiratory failure 2/2 acute exacerbation of interstitial lung disease with hemoptysis Right PTX Concern for [facility] Concern for CAP Cannot exclude CAP vs. DAH, given bronch findings/CTA Chest. ANA, HSP, RF and all ILD markers have been neg in past. Ultimately required intubation 11/12. Bronchoscopy completed 11/12. - Intubated 11/12 - Continue full vent support (4-8cc/kg IBW) - Wean FiO2 for O2 sat >  88% - Daily WUA/SBT as mental status permits - VAP bundle - Continue steroids (DAH dosing, 250mg  BID x 5 days, then taper) - Bronchodilators (Brovana/Yupelri, Pulmicort, Xopenex PRN) - Pulmonary hygiene - PAD protocol for sedation: Fentanyl for goal RASS 0 to -1 - S/p pigtail placement for R PTX - Follow CXR - F/u Fungitell B-D-Glucan, PCP, DFA, sputum Cx (prelim moderate gram positive cocci, few GNRs, few GPRs), AFB/fungal Cx - Hold AC, consider TXA neb if recurrent hemoptysis  History of Afib CAD HLD Chronic combined HFrEF (30-35% EF) History of VT HTN Mild troponinemia  Echo 11/12 with EF < 20%, severely decreased LV function, +RWMAs, severely dilated LV, mild concentric LVH, dyskinesis of LV/mid-anterolateral wall/anterior wall, akinesis of LV/inferior wall/inferolateral wall, reduced RV function, severe MR. - Remains on epi, vaso - Trend BNP, Co-ox - Optimize electrolyes (K > 4, Mg > 2) - Cardiac monitoring - Hold AC for now - Hold statin for now, given transaminitis  Leukocytosis  PCT 1.71. WBC downtrending 11/12-11/13. - Trend WBC, fever curve - F/u Cx data - Continue broad-spectrum antibiotics (CTX, azithromycin, atovaquone)  AKI, likely ATN in the setting of hemodynamic insults - baseline Cr 0.92, now 1.75 11/13. Hypovolemic  hyponatremia  had mixed picture he is hypo-osmolar but volume status perplexing. Neomia Dear suggested hypoperfused or volume depleted as did his urine Osmo. Got lasix 11/11. Na dropped further. - Trend BMP - Replete electrolytes as indicated - Monitor I&Os, CVP (2-3 11/13AM) - Urine studies (Urine Osm 714, Urine Na < 10) - Avoid nephrotoxic agents as able - Ensure adequate renal perfusion  Transaminitis Likely in the setting of hemodynamic instability/hypotension. - Trend LFTs - Hold statin for now  History of TIA and cognitive impairment Nocturnal delirium/sundowning (first noted 11/11) exacerbated by steroids, BIPAP etc. Now intubated/sedated.    - PAD protocol as above - Delirium precautions  Hyperglycemia, steroid-induced - Basal Semglee 10U - SSI + TF coverage - CBGs Q4H - Goal CBG 140-180  Best Practice (right click and "Reselect all SmartList Selections" daily)   Diet/type: NPO w/ oral meds DVT prophylaxis: SCD GI prophylaxis: PPI Lines: N/A Foley:  N/A Code Status:  full code Last date of multidisciplinary goals of care discussion [pending]  Critical care time:   The patient is critically ill with multiple organ system failure and requires high complexity decision making for assessment and support, frequent evaluation and titration of therapies, advanced monitoring, review of radiographic studies and interpretation of complex data.   Critical Care Time devoted to patient care services, exclusive of separately billable procedures, described in this note is 39 minutes.  Tim Lair, PA-C [facility] 11/27/22 7:28 AM  Please see [email or web address] for pager details.  From 7A-7P if no response, please call [phone] After hours, please call ELink [phone]

## 2022-11-28 ENCOUNTER — Other Ambulatory Visit (HOSPITAL_BASED_OUTPATIENT_CLINIC_OR_DEPARTMENT_OTHER): Payer: Medicare Other

## 2022-11-28 DIAGNOSIS — J9601 Acute respiratory failure with hypoxia: Secondary | ICD-10-CM | POA: Diagnosis not present

## 2022-11-28 DIAGNOSIS — N179 Acute kidney failure, unspecified: Secondary | ICD-10-CM | POA: Diagnosis not present

## 2022-11-28 DIAGNOSIS — J84112 Idiopathic pulmonary fibrosis: Secondary | ICD-10-CM | POA: Diagnosis not present

## 2022-11-28 DIAGNOSIS — E8770 Fluid overload, unspecified: Secondary | ICD-10-CM | POA: Diagnosis not present

## 2022-11-28 DIAGNOSIS — R0489 Hemorrhage from other sites in respiratory passages: Secondary | ICD-10-CM | POA: Diagnosis not present

## 2022-11-28 LAB — POCT I-STAT 7, (LYTES, BLD GAS, ICA,H+H)
Acid-Base Excess: 1 mmol/L (ref 0.0–2.0)
Acid-base deficit: 3 mmol/L — ABNORMAL HIGH (ref 0.0–2.0)
Bicarbonate: 27.3 mmol/L (ref 20.0–28.0)
Bicarbonate: 23.1 mmol/L (ref 20.0–28.0)
Calcium, Ion: 1.09 mmol/L — ABNORMAL LOW (ref 1.15–1.40)
Calcium, Ion: 1.11 mmol/L — ABNORMAL LOW (ref 1.15–1.40)
HCT: 33 % — ABNORMAL LOW (ref 39.0–52.0)
HCT: 33 % — ABNORMAL LOW (ref 39.0–52.0)
Hemoglobin: 11.2 g/dL — ABNORMAL LOW (ref 13.0–17.0)
Hemoglobin: 11.2 g/dL — ABNORMAL LOW (ref 13.0–17.0)
O2 Saturation: 94 %
O2 Saturation: 74 %
Patient temperature: 96.9
Patient temperature: 97.4
Potassium: 4.7 mmol/L (ref 3.5–5.1)
Potassium: 4.9 mmol/L (ref 3.5–5.1)
Sodium: 128 mmol/L — ABNORMAL LOW (ref 135–145)
Sodium: 127 mmol/L — ABNORMAL LOW (ref 135–145)
TCO2: 29 mmol/L (ref 22–32)
TCO2: 24 mmol/L (ref 22–32)
pCO2 arterial: 47.2 mm[Hg] (ref 32–48)
pCO2 arterial: 42.8 mm[Hg] (ref 32–48)
pH, Arterial: 7.365 (ref 7.35–7.45)
pH, Arterial: 7.337 — ABNORMAL LOW (ref 7.35–7.45)
pO2, Arterial: 72 mm[Hg] — ABNORMAL LOW (ref 83–108)
pO2, Arterial: 41 mm[Hg] — ABNORMAL LOW (ref 83–108)

## 2022-11-28 LAB — FUNGITELL BETA-D-GLUCAN
Fungitell Value:: <31.25 pg/mL
Result Name:: NEGATIVE

## 2022-11-28 LAB — COMPREHENSIVE METABOLIC PANEL
ALT: 1153 U/L — ABNORMAL HIGH (ref 0–44)
AST: 1321 U/L — ABNORMAL HIGH (ref 15–41)
Albumin: 2.7 g/dL — ABNORMAL LOW (ref 3.5–5.0)
Alkaline Phosphatase: 70 U/L (ref 38–126)
Anion gap: 11 (ref 5–15)
BUN: 87 mg/dL — ABNORMAL HIGH (ref 8–23)
CO2: 24 mmol/L (ref 22–32)
Calcium: 7.6 mg/dL — ABNORMAL LOW (ref 8.9–10.3)
Chloride: 89 mmol/L — ABNORMAL LOW (ref 98–111)
Creatinine, Ser: 3.61 mg/dL — ABNORMAL HIGH (ref 0.61–1.24)
GFR, Estimated: 16 mL/min — ABNORMAL LOW (ref >60)
Glucose, Bld: 246 mg/dL — ABNORMAL HIGH (ref 70–99)
Potassium: 4.8 mmol/L (ref 3.5–5.1)
Sodium: 124 mmol/L — ABNORMAL LOW (ref 135–145)
Total Bilirubin: 1.2 mg/dL — ABNORMAL HIGH (ref <1.2)
Total Protein: 5 g/dL — ABNORMAL LOW (ref 6.5–8.1)

## 2022-11-28 LAB — CBC
HCT: 32.9 % — ABNORMAL LOW (ref 39.0–52.0)
Hemoglobin: 11.1 g/dL — ABNORMAL LOW (ref 13.0–17.0)
MCH: 33.9 pg (ref 26.0–34.0)
MCHC: 33.7 g/dL (ref 30.0–36.0)
MCV: 100.6 fL — ABNORMAL HIGH (ref 80.0–100.0)
Platelets: 37 10*3/uL — ABNORMAL LOW (ref 150–400)
RBC: 3.27 MIL/uL — ABNORMAL LOW (ref 4.22–5.81)
RDW: 14 % (ref 11.5–15.5)
WBC: 11.2 10*3/uL — ABNORMAL HIGH (ref 4.0–10.5)
nRBC: 0.2 % (ref 0.0–0.2)

## 2022-11-28 LAB — PROCALCITONIN: Procalcitonin: 3.95 ng/mL

## 2022-11-28 LAB — MAGNESIUM: Magnesium: 2.9 mg/dL — ABNORMAL HIGH (ref 1.7–2.4)

## 2022-11-28 LAB — GLUCOSE, CAPILLARY
Glucose-Capillary: 234 mg/dL — ABNORMAL HIGH (ref 70–99)
Glucose-Capillary: 214 mg/dL — ABNORMAL HIGH (ref 70–99)
Glucose-Capillary: 197 mg/dL — ABNORMAL HIGH (ref 70–99)
Glucose-Capillary: 104 mg/dL — ABNORMAL HIGH (ref 70–99)
Glucose-Capillary: 203 mg/dL — ABNORMAL HIGH (ref 70–99)
Glucose-Capillary: 60 mg/dL — ABNORMAL LOW (ref 70–99)

## 2022-11-28 LAB — BRAIN NATRIURETIC PEPTIDE: B Natriuretic Peptide: 2368.5 pg/mL — ABNORMAL HIGH (ref 0.0–100.0)

## 2022-11-28 LAB — PHOSPHORUS: Phosphorus: 4.9 mg/dL — ABNORMAL HIGH (ref 2.5–4.6)

## 2022-11-28 LAB — CYTOLOGY - NON PAP

## 2022-11-28 MED ORDER — FENTANYL BOLUS VIA INFUSION
25.0000 ug | INTRAVENOUS | Status: DC | PRN
  Administered 2022-11-30: 25 ug via INTRAVENOUS

## 2022-11-28 MED ORDER — FUROSEMIDE 10 MG/ML IJ SOLN
40.0000 mg | Freq: Two times a day (BID) | INTRAMUSCULAR | Status: AC
  Administered 2022-11-28 (×2): 40 mg via INTRAVENOUS
  Filled 2022-11-28 (×2): qty 4

## 2022-11-28 MED ORDER — INSULIN GLARGINE-YFGN 100 UNIT/ML ~~LOC~~ SOLN
15.0000 [IU] | Freq: Two times a day (BID) | SUBCUTANEOUS | Status: DC
  Administered 2022-11-28 – 2022-11-29 (×4): 15 [IU] via SUBCUTANEOUS
  Filled 2022-11-28 (×6): qty 0.15

## 2022-11-28 MED ORDER — CALCIUM GLUCONATE-NACL 1-0.675 GM/50ML-% IV SOLN
1.0000 g | Freq: Once | INTRAVENOUS | Status: AC
Start: 2022-11-28 — End: 2022-11-28
  Administered 2022-11-28: 1000 mg via INTRAVENOUS
  Filled 2022-11-28: qty 50

## 2022-11-28 MED ORDER — PANTOPRAZOLE SODIUM 40 MG IV SOLR
40.0000 mg | Freq: Every day | INTRAVENOUS | Status: DC
  Administered 2022-11-29: 40 mg via INTRAVENOUS
  Filled 2022-11-28: qty 10

## 2022-11-28 NOTE — Progress Notes (Addendum)
NAME:  [name], MRN:  [id number], DOB:  September 30, 1940, LOS: 3 ADMISSION DATE:  11/24/2022, CONSULTATION DATE:  11/24/2022  REFERRING MD: Alinda Money - TRH, CHIEF COMPLAINT: Cough, SOB, hemoptysis  History of Present Illness:  82 year old man who presented to [facility] 11/10 for hemoptysis. PMHx significant for HTN, HLD, CAD, Afib (on Xarelto), VT, CHF (Echo 05/2021 EF 30-35%, G1DD), ICM with pacemaker/AICD in place, OSA, COPD, ILD (UIP), TIA, GERD. Recently seen by [facility] (Dr. Marchelle Gearing) 10/22 for hemoptysis, persistent SOB/cough since 08/2022.   Presented to [facility] ED 11/10 for persistent SOB/cough with hemoptysis, recently treated with steroids/antibiotics (doxy, then Keflex). SpO2 in ED initially 78% on RA. Hypotensive with SBP 90s-100s. Labs were notable for WBC 11.6, Hgb 14.7, Plt 244. Na 131, K 4.8, CO2 20, Cr 0.92. BNP 477. Mild troponin leak (27), COVID/Flu negative. CXR with ?pulmonary edema, acute-on-chronic changes. CTA Chest PE protocol negative for PE, +diffuse GGOs with septal thickening, ?atypical infection versus DAH. Broad-spectrum antibiotics started. Initially management with HHFNC, BiPAP (limited by anxiety), ultimately requited intubation 11/12.  Patient remains critically ill in ICU.  Pertinent  Medical History   Past Medical History:  Diagnosis Date   Adenomatous colon polyp 2009; 06/2012   Colonoscopy 07/2007---Dr. Maryjane Hurter GI--Repeat 06/2012 showed one tubular adenoma w/out high grade dysplasia.   AICD (automatic cardioverter/defibrillator) present    Environmental manager   Atrial fibrillation (HCC)    Bilateral sensorineural hearing loss    BPH (benign prostatic hypertrophy)    PSAs ok (followed by urologist): no LUTS   CAD (coronary artery disease)    PTCA RCA 1997 (Inf/post MI);   CHF (congestive heart failure) (HCC)    Chronic constipation    Chronic rhinitis    ?vasomotor (Dr. Lazarus Salines)   COPD (chronic obstructive pulmonary disease) (HCC)    Diverticulosis of colon 2005    Noted on Colonoscopies in 2005 and 2009 and on CT 2010   Gallstones 06/2014   No evidence of cholecystitis on abd u/s 06/2014   GERD (gastroesophageal reflux disease)    Hepatic steatosis 2010; 2016   Noted on CT abd 2010 and on u/s abd 04/2009 (transaminasemia) and u/s 06/2014.   Hyperlipidemia    Hypertension    Ischemic cardiomyopathy    Microscopic hematuria    Cysto neg 02/2009 except for BPH-likely has microhem from his renal stone dz   Nephrolithiasis    No distal stones on CT 2010; stable 8 mm RLP stone, 3mm LMP stone 10/2013 plain film.  Abd u/s 06/2014: 9 mm nonobstructing lower pole right renal stone.  03/2015 urol: stable nonobstructing renal calc by CT 12/2014 and KUB 03/2015.   Obstructive sleep apnea    no cpap uses since weight loss of 50 pounds   Presence of permanent cardiac pacemaker    [employer]   Squamous cell carcinoma of back    Ventricular tachycardia (HCC)     Significant Hospital Events: Including procedures, antibiotic start and stop dates in addition to other pertinent events   11/10 Admitted w/ hemoptysis and acute hypoxic resp failure. Sats 70s on room air. Moved to ICU given concern for high risk intubation., Started on CAP coverage. Made NPO for FOB. O2 needs up to 15 liters so Xarelto stopped 11/11 Resp viral panel neg, covid neg. MRSA neg. Systemic steroids started. Sputum x 2 sent back w/ report not adequate specimen. His urine studies suggested hypovolemia vs ineffective renal perf. Not really overload. Had to be placed on NIPPV at night.  Required precedex as seemed like sundowned. Inc'd solumed to 500 q 12.  11/12 - Intubated, RIJ CVC placed. Bronch completed with increasingly bloody secretions (RUL). Superior segment blood-tinged. R anterior pigtail chest tube placed for 20-30% R PTX. A-line inserted. Epi and vaso started.  Interim History / Subjective:  No significant events overnight Renal function much worse. Not much UOP, 65cc past 24 hours. Net  +5.4 for admission. CVP 7-8 but unsure accuracy On 1 NE, 1 epi, 0.04 Vaso  Objective:  Blood pressure 103/63, pulse 71, temperature 98.2 F (36.8 C), temperature source Oral, resp. rate (!) 6, height 5\' 10"  (1.778 m), weight 86.4 kg, SpO2 98%. CVP:  [2 mmHg-33 mmHg] 8 mmHg  Vent Mode: PRVC FiO2 (%):  [40 %-70 %] 40 % Set Rate:  [32 bmp] 32 bmp Vt Set:  [430 mL] 430 mL PEEP:  [5 cmH20-12 cmH20] 10 cmH20 Pressure Support:  [5 cmH20] 5 cmH20 Plateau Pressure:  [26 cmH20-28 cmH20] 28 cmH20   Intake/Output Summary (Last 24 hours) at 11/28/2022 0748 Last data filed at 11/28/2022 0700 Gross per 24 hour  Intake 3438.65 ml  Output 105 ml  Net 3333.65 ml   Filed Weights   11/26/22 0500 11/27/22 0500 11/28/22 0500  Weight: 81.9 kg 87.4 kg 86.4 kg   Physical Examination: General: Elderly male, critically ill, in  NAD. HEENT: [place]/AT. Sclerae anicteric. ETT in place. Cardiovascular: RRR, no M/R/G.  Lungs: Respirations even and unlabored.  CTA bilaterally, No W/R/R. R chest tube in place, no air leak. Abdomen: BS x 4, soft, NT/ND.  Musculoskeletal: No gross deformities, 1+ edema.  Skin: Intact, warm, no rashes.   Assessment & Plan:   Acute Hypoxic respiratory failure 2/2 acute exacerbation of interstitial lung disease/IPF flare with hemoptysis Right PTX - mostly resolved after chest tube placement DAH Concern for CAP ANA, HSP, RF and all ILD markers have been neg in past. Ultimately required intubation 11/12. Bronchoscopy completed 11/12. Dr. Marchelle Gearing knows pt and family well. Had discussion with them 11/13 and all in agreement for trial limited trial of intubation and hopeful improvement - Continue full vent support (4-8cc/kg IBW) - Wean FiO2 for O2 sat > 88% - Daily WUA/SBT as mental status permits - VAP bundle - Continue steroids as ordered (DAH dosing, 250mg  BID x 3 days, then taper) - Bronchodilators (Brovana/Yupelri, Pulmicort, Xopenex PRN) - Pulmonary hygiene - PAD protocol  for sedation: Fentanyl for goal RASS 0 to -1 - Routine chest tube care/flushes - Follow CXR - Continue CTX, Azithromycin - F/u Fungitell B-D-Glucan, PCP, DFA, sputum Cx (prelim moderate gram positive cocci, few GNRs, few GPRs), AFB/fungal Cx - Hold AC, consider TXA neb if recurrent hemoptysis - Continue discussions with family, time limited trial 5 - 7 days planned  History of Afib - on Mexiletine but LFT's climbing CAD HLD Chronic combined HFrEF (30-35% EF) History of VT HTN Mild troponinemia  Echo 11/12 with EF < 20%, severely decreased LV function, +RWMAs, severely dilated LV, mild concentric LVH, dyskinesis of LV/mid-anterolateral wall/anterior wall, akinesis of LV/inferior wall/inferolateral wall, reduced RV function, severe MR. - Remains on epi, vaso, norepi though low dose - Wean above as able - Trend BNP, Co-ox - Optimize electrolyes (K > 4, Mg > 2) - Cardiac monitoring - Hold AC for now - Hold statin for now, given transaminitis - D/c Mexiletine given worsening transaminitis - If has rhythm issues after Mexiletine d/c, will have to discuss with EP and pharmacy for alternative options (No amio either of  course)  Leukocytosis  PCT 1.71. WBC downtrending 11/12-11/13. - Trend WBC, fever curve - F/u Cx data - Continue broad-spectrum antibiotics (CTX, azithromycin, atovaquone)  AKI, likely ATN in the setting of hemodynamic insults - worsened and now oliguric Hyponatremia  had mixed picture he is hypo-osmolar but volume status perplexing. - Re challenge with lasix today x 2 doses - Trend BMP - Replete electrolyte - Avoid nephrotoxic agents as able - Ensure adequate renal perfusion  Transaminitis Likely in the setting of hemodynamic instability/hypotension. - Trend LFTs - Hold statin for now - D/c mexiletine as LFTs worse  History of TIA and cognitive impairment Nocturnal delirium/sundowning (first noted 11/11) exacerbated by steroids, BIPAP etc. Now intubated/sedated.    - PAD protocol as above - Delirium precautions  Hyperglycemia, steroid-induced - Basal Semglee increase from 10U to 15U BID - SSI + TF coverage - CBGs Q4H - Goal CBG 140-180  Best Practice (right click and "Reselect all SmartList Selections" daily)   Diet/type: NPO w/ oral meds DVT prophylaxis: SCD GI prophylaxis: PPI Lines: N/A Foley:  N/A Code Status:  full code Last date of multidisciplinary goals of care discussion [pending]  Critical care time: 35 min.   Rutherford Guys, PA - C [place] [facility] For pager details, please see AMION or use Epic chat  After 1900, please call [facility] for cross coverage needs 11/28/2022, 8:11 AM

## 2022-11-29 ENCOUNTER — Inpatient Hospital Stay (HOSPITAL_COMMUNITY): Payer: Medicare Other

## 2022-11-29 DIAGNOSIS — N179 Acute kidney failure, unspecified: Secondary | ICD-10-CM

## 2022-11-29 DIAGNOSIS — G9341 Metabolic encephalopathy: Secondary | ICD-10-CM

## 2022-11-29 DIAGNOSIS — R34 Anuria and oliguria: Secondary | ICD-10-CM | POA: Diagnosis not present

## 2022-11-29 DIAGNOSIS — E875 Hyperkalemia: Secondary | ICD-10-CM | POA: Diagnosis not present

## 2022-11-29 DIAGNOSIS — J9601 Acute respiratory failure with hypoxia: Secondary | ICD-10-CM | POA: Diagnosis not present

## 2022-11-29 LAB — POCT I-STAT 7, (LYTES, BLD GAS, ICA,H+H)
Acid-base deficit: 5 mmol/L — ABNORMAL HIGH (ref 0.0–2.0)
Acid-base deficit: 6 mmol/L — ABNORMAL HIGH (ref 0.0–2.0)
Acid-base deficit: 5 mmol/L — ABNORMAL HIGH (ref 0.0–2.0)
Bicarbonate: 24 mmol/L (ref 20.0–28.0)
Bicarbonate: 23.8 mmol/L (ref 20.0–28.0)
Bicarbonate: 23.8 mmol/L (ref 20.0–28.0)
Calcium, Ion: 1.06 mmol/L — ABNORMAL LOW (ref 1.15–1.40)
Calcium, Ion: 1.07 mmol/L — ABNORMAL LOW (ref 1.15–1.40)
Calcium, Ion: 1.07 mmol/L — ABNORMAL LOW (ref 1.15–1.40)
HCT: 35 % — ABNORMAL LOW (ref 39.0–52.0)
HCT: 35 % — ABNORMAL LOW (ref 39.0–52.0)
HCT: 34 % — ABNORMAL LOW (ref 39.0–52.0)
Hemoglobin: 11.9 g/dL — ABNORMAL LOW (ref 13.0–17.0)
Hemoglobin: 11.9 g/dL — ABNORMAL LOW (ref 13.0–17.0)
Hemoglobin: 11.6 g/dL — ABNORMAL LOW (ref 13.0–17.0)
O2 Saturation: 98 %
O2 Saturation: 94 %
O2 Saturation: 97 %
Patient temperature: 96.7
Patient temperature: 99.3
Patient temperature: 99
Potassium: 5.4 mmol/L — ABNORMAL HIGH (ref 3.5–5.1)
Potassium: 4.8 mmol/L (ref 3.5–5.1)
Potassium: 4.6 mmol/L (ref 3.5–5.1)
Sodium: 126 mmol/L — ABNORMAL LOW (ref 135–145)
Sodium: 129 mmol/L — ABNORMAL LOW (ref 135–145)
Sodium: 131 mmol/L — ABNORMAL LOW (ref 135–145)
TCO2: 26 mmol/L (ref 22–32)
TCO2: 26 mmol/L (ref 22–32)
TCO2: 26 mmol/L (ref 22–32)
pCO2 arterial: 59.4 mm[Hg] — ABNORMAL HIGH (ref 32–48)
pCO2 arterial: 70.6 mm[Hg] (ref 32–48)
pCO2 arterial: 60 mm[Hg] — ABNORMAL HIGH (ref 32–48)
pH, Arterial: 7.209 — ABNORMAL LOW (ref 7.35–7.45)
pH, Arterial: 7.137 — CL (ref 7.35–7.45)
pH, Arterial: 7.208 — ABNORMAL LOW (ref 7.35–7.45)
pO2, Arterial: 123 mm[Hg] — ABNORMAL HIGH (ref 83–108)
pO2, Arterial: 98 mm[Hg] (ref 83–108)
pO2, Arterial: 110 mm[Hg] — ABNORMAL HIGH (ref 83–108)

## 2022-11-29 LAB — ACID FAST SMEAR (AFB, MYCOBACTERIA): Acid Fast Smear: NEGATIVE

## 2022-11-29 LAB — CULTURE, BLOOD (ROUTINE X 2)
Culture: NO GROWTH
Culture: NO GROWTH
Special Requests: ADEQUATE

## 2022-11-29 LAB — RENAL FUNCTION PANEL
Albumin: 2.4 g/dL — ABNORMAL LOW (ref 3.5–5.0)
Anion gap: 13 (ref 5–15)
BUN: 128 mg/dL — ABNORMAL HIGH (ref 8–23)
CO2: 21 mmol/L — ABNORMAL LOW (ref 22–32)
Calcium: 7.2 mg/dL — ABNORMAL LOW (ref 8.9–10.3)
Chloride: 96 mmol/L — ABNORMAL LOW (ref 98–111)
Creatinine, Ser: 4.71 mg/dL — ABNORMAL HIGH (ref 0.61–1.24)
GFR, Estimated: 12 mL/min — ABNORMAL LOW (ref >60)
Glucose, Bld: 254 mg/dL — ABNORMAL HIGH (ref 70–99)
Phosphorus: 4.6 mg/dL (ref 2.5–4.6)
Potassium: 4.5 mmol/L (ref 3.5–5.1)
Sodium: 130 mmol/L — ABNORMAL LOW (ref 135–145)

## 2022-11-29 LAB — CBC
HCT: 34.8 % — ABNORMAL LOW (ref 39.0–52.0)
Hemoglobin: 11.5 g/dL — ABNORMAL LOW (ref 13.0–17.0)
MCH: 34.2 pg — ABNORMAL HIGH (ref 26.0–34.0)
MCHC: 33 g/dL (ref 30.0–36.0)
MCV: 103.6 fL — ABNORMAL HIGH (ref 80.0–100.0)
Platelets: 39 10*3/uL — ABNORMAL LOW (ref 150–400)
RBC: 3.36 MIL/uL — ABNORMAL LOW (ref 4.22–5.81)
RDW: 14.6 % (ref 11.5–15.5)
WBC: 12.8 10*3/uL — ABNORMAL HIGH (ref 4.0–10.5)
nRBC: 0.9 % — ABNORMAL HIGH (ref 0.0–0.2)

## 2022-11-29 LAB — BASIC METABOLIC PANEL
Anion gap: 13 (ref 5–15)
BUN: 131 mg/dL — ABNORMAL HIGH (ref 8–23)
CO2: 23 mmol/L (ref 22–32)
Calcium: 8.1 mg/dL — ABNORMAL LOW (ref 8.9–10.3)
Chloride: 93 mmol/L — ABNORMAL LOW (ref 98–111)
Creatinine, Ser: 4.97 mg/dL — ABNORMAL HIGH (ref 0.61–1.24)
GFR, Estimated: 11 mL/min — ABNORMAL LOW (ref >60)
Glucose, Bld: 217 mg/dL — ABNORMAL HIGH (ref 70–99)
Potassium: 5.8 mmol/L — ABNORMAL HIGH (ref 3.5–5.1)
Sodium: 129 mmol/L — ABNORMAL LOW (ref 135–145)

## 2022-11-29 LAB — CULTURE, RESPIRATORY W GRAM STAIN: Culture: NORMAL

## 2022-11-29 LAB — GLUCOSE, CAPILLARY
Glucose-Capillary: 178 mg/dL — ABNORMAL HIGH (ref 70–99)
Glucose-Capillary: 203 mg/dL — ABNORMAL HIGH (ref 70–99)
Glucose-Capillary: 207 mg/dL — ABNORMAL HIGH (ref 70–99)
Glucose-Capillary: 218 mg/dL — ABNORMAL HIGH (ref 70–99)
Glucose-Capillary: 243 mg/dL — ABNORMAL HIGH (ref 70–99)
Glucose-Capillary: 284 mg/dL — ABNORMAL HIGH (ref 70–99)

## 2022-11-29 LAB — PHOSPHORUS: Phosphorus: 7.1 mg/dL — ABNORMAL HIGH (ref 2.5–4.6)

## 2022-11-29 LAB — MAGNESIUM: Magnesium: 3.7 mg/dL — ABNORMAL HIGH (ref 1.7–2.4)

## 2022-11-29 MED ORDER — INSULIN ASPART 100 UNIT/ML IJ SOLN
7.0000 [IU] | INTRAMUSCULAR | Status: DC
  Administered 2022-11-29 – 2022-11-30 (×6): 7 [IU] via SUBCUTANEOUS

## 2022-11-29 MED ORDER — PRISMASOL BGK 4/2.5 32-4-2.5 MEQ/L EC SOLN: Status: DC

## 2022-11-29 MED ORDER — PROSOURCE TF20 ENFIT COMPATIBL EN LIQD
60.0000 mL | Freq: Two times a day (BID) | ENTERAL | Status: DC
  Administered 2022-11-29: 60 mL
  Filled 2022-11-29: qty 60

## 2022-11-29 MED ORDER — RENA-VITE PO TABS
1.0000 | ORAL_TABLET | Freq: Two times a day (BID) | ORAL | Status: DC
  Administered 2022-11-29 – 2022-11-30 (×3): 1
  Filled 2022-11-29 (×3): qty 1

## 2022-11-29 MED ORDER — PRISMASOL BGK 4/2.5 32-4-2.5 MEQ/L REPLACEMENT SOLN: Status: DC

## 2022-11-29 MED ORDER — SODIUM ZIRCONIUM CYCLOSILICATE 10 G PO PACK
10.0000 g | PACK | Freq: Once | ORAL | Status: AC
  Administered 2022-11-29: 10 g
  Filled 2022-11-29: qty 1

## 2022-11-29 MED ORDER — HEPARIN SODIUM (PORCINE) 1000 UNIT/ML DIALYSIS
1000.0000 [IU] | INTRAMUSCULAR | Status: DC | PRN
  Administered 2022-11-29: 3000 [IU] via INTRAVENOUS_CENTRAL
  Administered 2022-11-29 – 2022-11-30 (×3): 2800 [IU] via INTRAVENOUS_CENTRAL
  Filled 2022-11-29 (×3): qty 3
  Filled 2022-11-29 (×4): qty 6
  Filled 2022-11-29: qty 3

## 2022-11-29 NOTE — Progress Notes (Signed)
NAME:  [name], MRN:  [id number], DOB:  07/23/1940, LOS: 4 ADMISSION DATE:  12/14/2022, CONSULTATION DATE:  11/21/2022  REFERRING MD: Alinda Money - TR[facility], CHIEF COMPLAINT: Cough, SOB, hemoptysis  History of Present Illness:  82 year old man who presented to [facility] 11/10 for hemoptysis. PMHx significant for HTN, HLD, CAD, Afib (on Xarelto), VT, CHF (Echo 05/2021 EF 30-35%, G1DD), ICM with pacemaker/AICD in place, OSA, COPD, ILD (UIP), TIA, GERD. Recently seen by [facility] (Dr. Marchelle Gearing) 10/22 for hemoptysis, persistent SOB/cough since 08/2022.   Presented to [facility] ED 11/10 for persistent SOB/cough with hemoptysis, recently treated with steroids/antibiotics (doxy, then Keflex). SpO2 in ED initially 78% on RA. Hypotensive with SBP 90s-100s. Labs were notable for WBC 11.6, Hgb 14.7, Plt 244. Na 131, K 4.8, CO2 20, Cr 0.92. BNP 477. Mild troponin leak (27), COVID/Flu negative. CXR with ?pulmonary edema, acute-on-chronic changes. CTA Chest PE protocol negative for PE, +diffuse GGOs with septal thickening, ?atypical infection versus DAH. Broad-spectrum antibiotics started. Initially management with HHFNC, BiPAP (limited by anxiety), ultimately requited intubation 11/12.  Patient remains critically ill in ICU.  Pertinent  Medical History   Past Medical History:  Diagnosis Date   Adenomatous colon polyp 2009; 06/2012   Colonoscopy 07/2007---Dr. Maryjane Hurter [facility]--Repeat 06/2012 showed one tubular adenoma w/out high grade dysplasia.   AICD (automatic cardioverter/defibrillator) present    Environmental manager   Atrial fibrillation (HCC)    Bilateral sensorineural hearing loss    BPH (benign prostatic hypertrophy)    PSAs ok (followed by urologist): no LUTS   CAD (coronary artery disease)    PTCA RCA 1997 (Inf/post MI);   CHF (congestive heart failure) (HCC)    Chronic constipation    Chronic rhinitis    ?vasomotor (Dr. Lazarus Salines)   COPD (chronic obstructive pulmonary disease) (HCC)    Diverticulosis of colon 2005    Noted on Colonoscopies in 2005 and 2009 and on CT 2010   Gallstones 06/2014   No evidence of cholecystitis on abd u/s 06/2014   GERD (gastroesophageal reflux disease)    Hepatic steatosis 2010; 2016   Noted on CT abd 2010 and on u/s abd 04/2009 (transaminasemia) and u/s 06/2014.   Hyperlipidemia    Hypertension    Ischemic cardiomyopathy    Microscopic hematuria    Cysto neg 02/2009 except for BPH-likely has microhem from his renal stone dz   Nephrolithiasis    No distal stones on CT 2010; stable 8 mm RLP stone, 3mm LMP stone 10/2013 plain film.  Abd u/s 06/2014: 9 mm nonobstructing lower pole right renal stone.  03/2015 urol: stable nonobstructing renal calc by CT 12/2014 and KUB 03/2015.   Obstructive sleep apnea    no cpap uses since weight loss of 50 pounds   Presence of permanent cardiac pacemaker    [employer]   Squamous cell carcinoma of back    Ventricular tachycardia (HCC)     Significant Hospital Events: Including procedures, antibiotic start and stop dates in addition to other pertinent events   11/10 Admitted w/ hemoptysis and acute hypoxic resp failure. Sats 70s on room air. Moved to ICU given concern for high risk intubation., Started on CAP coverage. Made NPO for FOB. O2 needs up to 15 liters so Xarelto stopped 11/11 Resp viral panel neg, covid neg. MRSA neg. Systemic steroids started. Sputum x 2 sent back w/ report not adequate specimen. His urine studies suggested hypovolemia vs ineffective renal perf. Not really overload. Had to be placed on NIPPV at night.  Required precedex as seemed like sundowned. Inc'd solumed to 500 q 12.  11/12 - Intubated, RIJ CVC placed. Bronch completed with increasingly bloody secretions (RUL). Superior segment blood-tinged. R anterior pigtail chest tube placed for 20-30% R PTX. A-line inserted. Epi and vaso started. 11/14 lasix challenge, no response 11/15 worsening renal function and mental status, off sedation  Interim History / Subjective:   Trial of Lasix x 2 yesterday. Unfortunately no response, only had 225 UOP all day. Renal function worse with SCr 4.9/BUN 131, K 5.8. Was on 25 Fentanyl overnight, off at 6am and currently unresponsive off all sedation. Now only on 0.5 Epi. Family not yet at bedside.  Objective:  Blood pressure (!) 100/55, pulse 68, temperature 97.7 F (36.5 C), temperature source Oral, resp. rate (!) 32, height 5\' 10"  (1.778 m), weight 87.5 kg, SpO2 98%. CVP:  [5 mmHg-8 mmHg] 8 mmHg  Vent Mode: PRVC FiO2 (%):  [40 %-70 %] 50 % Set Rate:  [32 bmp] 32 bmp Vt Set:  [430 mL] 430 mL PEEP:  [10 cmH20] 10 cmH20 Plateau Pressure:  [21 cmH20-24 cmH20] 22 cmH20   Intake/Output Summary (Last 24 hours) at 11/29/2022 0745 Last data filed at 11/29/2022 0600 Gross per 24 hour  Intake 1976.77 ml  Output 307 ml  Net 1669.77 ml   Filed Weights   11/27/22 0500 11/28/22 0500 11/29/22 0500  Weight: 87.4 kg 86.4 kg 87.5 kg   Physical Examination: General: Elderly male, critically ill, in  NAD. HEENT: [place]/AT. Sclerae anicteric. ETT in place. Cardiovascular: RRR, no M/R/G.  Lungs: Respirations even and unlabored.  CTA bilaterally, No W/R/R. R anterior chest tube in place, no air leak. Abdomen: BS x 4, soft, NT/ND.  Musculoskeletal: No gross deformities, 1+ edema.  Skin: Intact, cool extremities, no rashes.   Assessment & Plan:   Acute Hypoxic respiratory failure 2/2 acute exacerbation of interstitial lung disease/IPF flare with hemoptysis Right PTX - mostly resolved after chest tube placement DAH Concern for CAP ANA, HSP, RF and all ILD markers have been neg in past. Ultimately required intubation 11/12. Bronchoscopy completed 11/12. Dr. Marchelle Gearing knows pt and family well. Had discussion with them 11/13 and all in agreement for trial limited trial of intubation and hopeful improvement - Continue full vent support (4-8cc/kg IBW) - Not stable for wean - VAP bundle - Continue steroids as ordered (DAH dosing,  250mg  BID x 3 days, then taper) - Bronchodilators (Brovana/Yupelri, Pulmicort, Xopenex PRN) - Pulmonary hygiene - Routine chest tube care/flushes - Follow CXR - Continue CTX, Azithromycin - F/u Fungitell B-D-Glucan, PCP, DFA, sputum Cx (prelim moderate gram positive cocci, few GNRs, few GPRs), AFB/fungal Cx - Hold AC, consider TXA neb if recurrent hemoptysis - Will need to have discussions with family today. They were hoping for 7-10 day time limited trial; however, pt starting to decompensate with worsening MSOF at this point  History of Afib - on Mexiletine but LFT's climbing CAD HLD Chronic combined HFrEF (30-35% EF) History of VT HTN Mild troponinemia  Echo 11/12 with EF < 20%, severely decreased LV function, +RWMAs, severely dilated LV, mild concentric LVH, dyskinesis of LV/mid-anterolateral wall/anterior wall, akinesis of LV/inferior wall/inferolateral wall, reduced RV function, severe MR. - Remains on epi low dose - Optimize electrolyes (K > 4, Mg > 2) - Cardiac monitoring - Hold AC for now - Hold statin for now, given transaminitis - D/c Mexiletine given worsening transaminitis - If has rhythm issues after Mexiletine d/c, will have to discuss with EP and  pharmacy for alternative options (No amio either of course)  Leukocytosis  PCT 1.71. WBC downtrending 11/12-11/13. - Trend WBC, fever curve - F/u Cx data - Continue broad-spectrum antibiotics (CTX, azithromycin, atovaquone)  AKI, likely ATN in the setting of hemodynamic insults - worsened and now oliguric Hyponatremia  had mixed picture he is hypo-osmolar but volume status perplexing. Hyperkalemia - Will need to have discussions with family today due to worsening MSOF, unfortunately he has worsened. Would advocate to transition to comfort as he is not a dialysis candidate - s/p Lokelma - Avoid nephrotoxic agents as able - Ensure adequate renal perfusion  Transaminitis Likely in the setting of hemodynamic  instability/hypotension. - Trend LFTs  - Hold statin for now - D/c mexiletine as LFTs worse  History of TIA and cognitive impairment Nocturnal delirium/sundowning (first noted 11/11) exacerbated by steroids, BIPAP etc. Now intubated/sedated.   - PAD protocol as above - Delirium precautions  Hyperglycemia, steroid-induced - Basal Semglee 15U BID - SSI + TF coverage - CBGs Q4H - Goal CBG 140-180  Goals of care: Family hoping for 7-10 day time limited trial of intubation and high dose steroids. Unfortunately he has continued to require moderate vent support and his renal function has worsened to the point he is reaching the need for dialysis. Given his comorbidities and poor prognosis, he is not a great candidate for such and family is in agreement that they would not want to prolong his suffering. Dr. Marchelle Gearing knows pt and family well and has had discussions with them regarding concern for poor outcome here. Will await family arrival today and engage in further goals of care discussions at that time.   Best Practice (right click and "Reselect all SmartList Selections" daily)   Diet/type: tubefeeds DVT prophylaxis: SCD GI prophylaxis: PPI Lines: N/A Foley:  N/A Code Status:  full code Last date of multidisciplinary goals of care discussion [pending]  Critical care time: 35 min.   Rutherford Guys, PA - [facility] For pager details, please see AMION or use Epic chat  After 1900, please call [facility] for cross coverage needs 11/29/2022, 7:45 AM

## 2022-11-29 NOTE — Procedures (Signed)
Central Venous Catheter Insertion Procedure Note  KERMITT MARCELLO  [id number]  November 19, 1940  Date:11/29/22  Time:12:16 PM   Provider Performing:Harjot Dibello Celine Mans   Procedure: Insertion of Non-tunneled Central Venous Catheter(36556)with US guidance (29518)    Indication(s) Difficult access and Hemodialysis  Consent Risks of the procedure as well as the alternatives and risks of each were explained to the patient and/or caregiver.  Consent for the procedure was obtained and is signed in the bedside chart  Anesthesia Topical only with 1% lidocaine   Timeout Verified patient identification, verified procedure, site/side was marked, verified correct patient position, special equipment/implants available, medications/allergies/relevant history reviewed, required imaging and test results available.  Sterile Technique Maximal sterile technique including full sterile barrier drape, hand hygiene, sterile gown, sterile gloves, mask, hair covering, sterile ultrasound probe cover (if used).  Procedure Description Area of catheter insertion was cleaned with chlorhexidine and draped in sterile fashion.   With real-time ultrasound guidance a HD catheter was placed into the right femoral vein.  Nonpulsatile blood flow and easy flushing noted in all ports.  The catheter was sutured in place and sterile dressing applied.  Complications/Tolerance None; patient tolerated the procedure well. Chest X-ray is ordered to verify placement for internal jugular or subclavian cannulation.  Chest x-ray is not ordered for femoral cannulation.  EBL Minimal  Specimen(s) None   Attempted L internal jugular insertion at first. Vessel easily cannulated on first attempted, but difficulty threading guidewire despite manipulation. Aborted site and moved to R femoral with successful placement.   Rutherford Guys, PA - C  Pulmonary & Critical Care Medicine For pager details, please see AMION or use Epic chat  After 1900,  please call [facility] for cross coverage needs 11/29/2022, 12:17 PM

## 2022-11-29 NOTE — Progress Notes (Signed)
Nutrition Follow-up  DOCUMENTATION CODES:   Non-severe (moderate) malnutrition in context of chronic illness  INTERVENTION:   Cortrak placement on hold per MD, plan for tentative placement next week  Tube Feeding via NG:  Continue Pivot 1.5 at 55 ml/hr Add Pro-source TF20 BID TF at goal rate provides 2140 kcals, 164 g of protein and 1003 mL of free water  Add Renal MVI BID while on CRRT to account for water soluble vitamin losses  Recommend bowel regimen given no recent BM  NUTRITION DIAGNOSIS:   Moderate Malnutrition related to chronic illness (HF, COPD, CAD, IPF) as evidenced by mild fat depletion, moderate muscle depletion, severe muscle depletion.  Being addressed via TF   GOAL:   Patient will meet greater than or equal to 90% of their needs  Progressing  MONITOR:   Vent status, Labs, Weight trends, I & O's, TF tolerance  REASON FOR ASSESSMENT:   Consult Enteral/tube feeding initiation and management, Assessment of nutrition requirement/status (CRRT)  ASSESSMENT:   Pt admitted with hemoptysis and acute hypoxic respiratory failure. PMH significant for HTN, HLD, GERD, MCI, OSA, TIA, CAD, afib, CHF, IPF, COPD, V tach.  Pt remains on vent support, currently off pressors  Pivot 1.5 at 55 ml/hr via NG tube  Lasix x 2 yesterday with no response, oliguric. Worsening AKI with electrolyte abnormalities, starting CRRT per Nephrology  Net +7 L since admission; current wt 87.5 kg  Labs:  sodium 126 (L), potassium 5.4 (H), phosphorus 7.1 (H), magnesium 3.1 (H), CBGs 60-234 Meds: solumedrol taper, ss novolog, novolog q 4 hours, semglee   Diet Order:   Diet Order             Diet NPO time specified  Diet effective now                   EDUCATION NEEDS:   No education needs have been identified at this time  Skin:  Skin Assessment: Reviewed RN Assessment  Last BM:  11/11 type 6 small  Height:   Ht Readings from Last 1 Encounters:  11/26/2022 5\' 10"   (1.778 m)    Weight:   Wt Readings from Last 1 Encounters:  11/29/22 87.5 kg    BMI:  Body mass index is 27.68 kg/m.  Estimated Nutritional Needs:   Kcal:  2000-2200  Protein:  150-170 g  Fluid:  >/=2L   Romelle Starcher MS, RDN, LDN, CNSC Registered Dietitian 3 Clinical Nutrition RD Pager and On-Call Pager Number Located in [place]

## 2022-11-29 NOTE — Progress Notes (Signed)
eLink Physician-Brief Progress Note Patient Name: [name] DOB: 10/12/1940 MRN: [id number]   Date of Service  11/29/2022  HPI/Events of Note  K+ 5.8, Cr 4.97  eICU Interventions  Lokelma 10 gm via NG tube  x 1 ordered.        Thomasene Lot Jerriyah Louis 11/29/2022, 6:15 AM

## 2022-11-29 NOTE — Consult Note (Signed)
[facility]  INPATIENT CONSULTATION  Reason for Consultation: AKI Requesting Provider: Dr. Denese Killings  HPI: [name] is an 82 y.o. male with severe ILD secondary to UIP, HTN, HL, PPM, CAD, A fib on xarelto, COPD, s/p AICD, HL, HFrEF 30-35% currently admitted for ILD flare and nephrology is consulted for evaluation and management of AKI.   Presented 11/10 with hemoptysis and due to worsening hypoxia required intubation and admission to ICU.  CTA done 75mL contrast.  BAL with DAH, started high dose steroids.  Shock requiring vasopressor support.  In the past few days he's had some clinical improvement with improving oxygenation and pressor requirements.  Unfortunately Cr was normal on presentation and has trended up to 5 today. UOP was 225 yesterday, scant today.  BUN up to 133 and per PCCM still with some ongoing blood lung secretions.    I/Os for admission +8L  Family understand the gravity of the situation and have elected a week trial of therapies but if not improving to not plan to continue long term care.   PMH: Past Medical History:  Diagnosis Date   Adenomatous colon polyp 2009; 06/2012   Colonoscopy 07/2007---Dr. Maryjane Hurter [facility]--Repeat 06/2012 showed one tubular adenoma w/out high grade dysplasia.   AICD (automatic cardioverter/defibrillator) present    Environmental manager   Atrial fibrillation (HCC)    Bilateral sensorineural hearing loss    BPH (benign prostatic hypertrophy)    PSAs ok (followed by urologist): no LUTS   CAD (coronary artery disease)    PTCA RCA 1997 (Inf/post MI);   CHF (congestive heart failure) (HCC)    Chronic constipation    Chronic rhinitis    ?vasomotor (Dr. Lazarus Salines)   COPD (chronic obstructive pulmonary disease) (HCC)    Diverticulosis of colon 2005   Noted on Colonoscopies in 2005 and 2009 and on CT 2010   Gallstones 06/2014   No evidence of cholecystitis on abd u/s 06/2014   GERD (gastroesophageal reflux disease)    Hepatic steatosis  2010; 2016   Noted on CT abd 2010 and on u/s abd 04/2009 (transaminasemia) and u/s 06/2014.   Hyperlipidemia    Hypertension    Ischemic cardiomyopathy    Microscopic hematuria    Cysto neg 02/2009 except for BPH-likely has microhem from his renal stone dz   Nephrolithiasis    No distal stones on CT 2010; stable 8 mm RLP stone, 3mm LMP stone 10/2013 plain film.  Abd u/s 06/2014: 9 mm nonobstructing lower pole right renal stone.  03/2015 urol: stable nonobstructing renal calc by CT 12/2014 and KUB 03/2015.   Obstructive sleep apnea    no cpap uses since weight loss of 50 pounds   Presence of permanent cardiac pacemaker    [employer]   Squamous cell carcinoma of back    Ventricular tachycardia (HCC)    PSH: Past Surgical History:  Procedure Laterality Date   ATRIAL ABLATION SURGERY     flutter ablation 2002   BREAST SURGERY Left 1990   "Tissue removed from breast"   CARDIAC CATHETERIZATION     CARDIOVERSION N/A 05/10/2020   Procedure: CARDIOVERSION;  Surgeon: Thurmon Fair, MD;  Location: [facility];  Service: Cardiovascular;  Laterality: N/A;   carotid doppler  09/25/12   No signif stenosis   COLONOSCOPY N/A 07/13/2012   Diverticula ascending colon.  Polyp x 1-recall 5 yrs (Dr. Ewing Schlein) Procedure: COLONOSCOPY;  Surgeon: Petra Kuba, MD;  Location: [facility];  Service: Endoscopy;  Laterality: N/A; polypectomy x  1    COLONOSCOPY WITH PROPOFOL N/A 08/18/2015   Procedure: COLONOSCOPY WITH PROPOFOL;  Surgeon: Napoleon Form, MD;  Location: [facility];  Service: Endoscopy;  Laterality: N/A;   ESOPHAGOGASTRODUODENOSCOPY N/A 07/13/2012   Procedure: ESOPHAGOGASTRODUODENOSCOPY (EGD);  Surgeon: Petra Kuba, MD;  Location: [facility];  Service: Endoscopy;  Laterality: N/A;  NORMAL   EXTRACORPOREAL SHOCK WAVE LITHOTRIPSY     HEMORRHOID BANDING     ICD     a) Guidant Contak H170- Sherryl Manges, MD 07/25/06 (second device) b) Medtronic CRT-D   ICD GENERATOR CHANGEOUT N/A 02/14/2020    Procedure: ICD GENERATOR CHANGEOUT;  Surgeon: Duke Salvia, MD;  Location: [facility] INVASIVE CV LAB;  Service: Cardiovascular;  Laterality: N/A;   INGUINAL HERNIA REPAIR Bilateral 1975   LEAD REVISION Bilateral 12/26/2011   Procedure: LEAD REVISION;  Surgeon: Marinus Maw, MD;  Location: [facility] CATH LAB;  Service: Cardiovascular;  Laterality: Bilateral;   PFTs  02/2015   Mod restriction (interstitial), mod diffusion defect (Dr. Maple Hudson)   precancer area keratsis removed  05/2015    leftf orehead and left arm and right arm and back     Past Medical History:  Diagnosis Date   Adenomatous colon polyp 2009; 06/2012   Colonoscopy 07/2007---Dr. Maryjane Hurter [facility]--Repeat 06/2012 showed one tubular adenoma w/out high grade dysplasia.   AICD (automatic cardioverter/defibrillator) present    Environmental manager   Atrial fibrillation (HCC)    Bilateral sensorineural hearing loss    BPH (benign prostatic hypertrophy)    PSAs ok (followed by urologist): no LUTS   CAD (coronary artery disease)    PTCA RCA 1997 (Inf/post MI);   CHF (congestive heart failure) (HCC)    Chronic constipation    Chronic rhinitis    ?vasomotor (Dr. Lazarus Salines)   COPD (chronic obstructive pulmonary disease) (HCC)    Diverticulosis of colon 2005   Noted on Colonoscopies in 2005 and 2009 and on CT 2010   Gallstones 06/2014   No evidence of cholecystitis on abd u/s 06/2014   GERD (gastroesophageal reflux disease)    Hepatic steatosis 2010; 2016   Noted on CT abd 2010 and on u/s abd 04/2009 (transaminasemia) and u/s 06/2014.   Hyperlipidemia    Hypertension    Ischemic cardiomyopathy    Microscopic hematuria    Cysto neg 02/2009 except for BPH-likely has microhem from his renal stone dz   Nephrolithiasis    No distal stones on CT 2010; stable 8 mm RLP stone, 3mm LMP stone 10/2013 plain film.  Abd u/s 06/2014: 9 mm nonobstructing lower pole right renal stone.  03/2015 urol: stable nonobstructing renal calc by CT 12/2014 and KUB 03/2015.    Obstructive sleep apnea    no cpap uses since weight loss of 50 pounds   Presence of permanent cardiac pacemaker    [employer]   Squamous cell carcinoma of back    Ventricular tachycardia (HCC)     Medications:  I have reviewed the patient's current medications.  Medications Prior to Admission  Medication Sig Dispense Refill   antiseptic oral rinse (BIOTENE) LIQD 15 mLs by Mouth Rinse route in the morning and at bedtime.     BREZTRI AEROSPHERE 160-9-4.8 MCG/ACT AERO INHALE 2 PUFFS INTO THE LUNGS IN THE MORNING AND AT BEDTIME 10.7 g 6   carvedilol (COREG) 3.125 MG tablet TAKE 1 TABLET(3.125 MG) BY MOUTH IN THE AM AND TAKE 2 TABLETS (3.125 MG) TOTAL OF 6.25 MG BY MOUTH IN THE PM WITH A  MEAL. 180 tablet 3   cholecalciferol (VITAMIN D) 1000 units tablet Take 1,000 Units by mouth every evening.     ENTRESTO 24-26 MG TAKE 1 TABLET BY MOUTH TWICE DAILY 180 tablet 3   eplerenone (INSPRA) 25 MG tablet TAKE 1 TABLET(25 MG) BY MOUTH DAILY 90 tablet 3   furosemide (LASIX) 40 MG tablet Take 1 tablet (40 mg total) by mouth daily. 90 tablet 3   ipratropium (ATROVENT) 0.06 % nasal spray Place 2 sprays into both nostrils 3 (three) times daily. 15 mL 1   JARDIANCE 10 MG TABS tablet TAKE 1 TABLET(10 MG) BY MOUTH DAILY BEFORE BREAKFAST (Patient taking differently: Take 10 mg by mouth daily.) 90 tablet 3   levalbuterol (XOPENEX HFA) 45 MCG/ACT inhaler INHALE 2 PUFFS INTO THE LUNGS EVERY 4 HOURS AS NEEDED FOR WHEEZING 15 g 6   loratadine (CLARITIN) 10 MG tablet Take 10 mg by mouth in the morning.     Magnesium 250 MG TABS Take 250 mg by mouth every evening.     mexiletine (MEXITIL) 150 MG capsule TAKE 2 CAPSULES(300 MG) BY MOUTH TWICE DAILY (Patient taking differently: Take 150 mg by mouth 2 (two) times daily.) 360 capsule 2   Multiple Vitamin (MULTIVITAMIN WITH MINERALS) TABS Take 1 tablet by mouth every evening.     nitroGLYCERIN (NITROSTAT) 0.4 MG SL tablet DISSOLVE 1 TABLET UNDER THE TONGUE EVERY 5  MINUTES AS NEEDED FOR CHEST PAIN 25 tablet 5   Pirfenidone (ESBRIET) 267 MG TABS Take 2 tablets (534 mg total) by mouth with breakfast, with lunch, and with evening meal. 540 tablet 1   potassium chloride SA (KLOR-CON M) 20 MEQ tablet Take 1 tablet (20 mEq total) by mouth daily with lunch. 90 tablet 3   ranolazine (RANEXA) 500 MG 12 hr tablet Take 1 tablet (500 mg total) by mouth 2 (two) times daily. 180 tablet 3   rivaroxaban (XARELTO) 20 MG TABS tablet TAKE 1 TABLET(20 MG) BY MOUTH DAILY WITH SUPPER 90 tablet 3   rosuvastatin (CRESTOR) 40 MG tablet TAKE 1 TABLET(40 MG) BY MOUTH EVERY EVENING 90 tablet 3   Spacer/Aero-Holding Chambers (AEROCHAMBER MV) inhaler Use as instructed 1 each 0   polyvinyl alcohol-povidone (HYPOTEARS) 1.4-0.6 % ophthalmic solution Place 1-2 drops into both eyes in the morning and at bedtime.      ALLERGIES:   Allergies  Allergen Reactions   Sulfa Antibiotics Rash   Doxycycline Nausea And Vomiting    FAM HX: Family History  Problem Relation Age of Onset   Heart disease Mother    Other Mother        colon surgery for fistula   Gallstones Mother    Breast cancer Maternal Grandmother    Coronary artery disease Father        family hx of   Heart attack Father        4 stents/ pacemaker   Nephrolithiasis Father    Hypertension Sister    Goiter Paternal Grandmother    Nephrolithiasis Son    Colon cancer Neg Hx     Social History:   reports that he quit smoking about 42 years ago. His smoking use included cigarettes. He started smoking about 57 years ago. He has a 30 pack-year smoking history. He has never used smokeless tobacco. He reports that he does not drink alcohol and does not use drugs.  ROS: unable to obtain from intubated pt  Blood pressure (!) 104/56, pulse 66, temperature 98.9 F (37.2 C), resp. rate [name])  22, height 5\' 10"  (1.778 m), weight 87.5 kg, SpO2 99%. PHYSICAL EXAM: Gen: elderly man on vent  ENT: ETT in place Neck: RIJ TLC CV: RRR,  no rub  Abd: spoft Lungs: noted crackles on vent GU: foley with some yellow urine present Extr:  no edema Neuro: sedated Skin: some scattered ecchymoses from IVs/phlebotomy but no purpura   Results for orders placed or performed during the hospital encounter of 11/16/2022 (from the past 48 hour(s))  Glucose, capillary     Status: Abnormal   Collection Time: 11/27/22 11:43 AM  Result Value Ref Range   Glucose-Capillary 377 (H) 70 - 99 mg/dL    Comment: Glucose reference range applies only to samples taken after fasting for at least 8 hours.  I-STAT 7, (LYTES, BLD GAS, ICA, H+H)     Status: Abnormal   Collection Time: 11/27/22  1:56 PM  Result Value Ref Range   pH, Arterial 7.333 (L) 7.35 - 7.45   pCO2 arterial 50.5 (H) 32 - 48 mmHg   pO2, Arterial 104 83 - 108 mmHg   Bicarbonate 27.1 20.0 - 28.0 mmol/L   TCO2 29 22 - 32 mmol/L   O2 Saturation 98 %   Acid-Base Excess 0.0 0.0 - 2.0 mmol/L   Sodium 128 (L) 135 - 145 mmol/L   Potassium 4.2 3.5 - 5.1 mmol/L   Calcium, Ion 1.09 (L) 1.15 - 1.40 mmol/L   HCT 37.0 (L) 39.0 - 52.0 %   Hemoglobin 12.6 (L) 13.0 - 17.0 g/dL   Patient temperature 95.6 C    Sample type ARTERIAL   Glucose, capillary     Status: Abnormal   Collection Time: 11/27/22  4:06 PM  Result Value Ref Range   Glucose-Capillary 209 (H) 70 - 99 mg/dL    Comment: Glucose reference range applies only to samples taken after fasting for at least 8 hours.  Comprehensive metabolic panel     Status: Abnormal   Collection Time: 11/27/22  5:12 PM  Result Value Ref Range   Sodium 126 (L) 135 - 145 mmol/L   Potassium 4.3 3.5 - 5.1 mmol/L   Chloride 89 (L) 98 - 111 mmol/L   CO2 25 22 - 32 mmol/L   Glucose, Bld 313 (H) 70 - 99 mg/dL    Comment: Glucose reference range applies only to samples taken after fasting for at least 8 hours.   BUN 73 (H) 8 - 23 mg/dL   Creatinine, Ser 2.13 (H) 0.61 - 1.24 mg/dL   Calcium 7.7 (L) 8.9 - 10.3 mg/dL   Total Protein 5.0 (L) 6.5 - 8.1 g/dL    Albumin 2.6 (L) 3.5 - 5.0 g/dL   AST 0,865 (H) 15 - 41 U/L   ALT 1,077 (H) 0 - 44 U/L   Alkaline Phosphatase 75 38 - 126 U/L   Total Bilirubin 0.9 <1.2 mg/dL   GFR, Estimated 27 (L) >60 mL/min    Comment: (NOTE) Calculated using the CKD-EPI Creatinine Equation (2021)    Anion gap 12 5 - 15    Comment: Performed at [facility], [address] [place], [place] [address]  Magnesium     Status: Abnormal   Collection Time: 11/27/22  5:12 PM  Result Value Ref Range   Magnesium 2.7 (H) 1.7 - 2.4 mg/dL    Comment: Performed at [facility], [address] [place], [place] [address]  Phosphorus     Status: Abnormal   Collection Time: 11/27/22  5:12 PM  Result Value Ref Range   Phosphorus 4.9 (H) 2.5 - 4.6 mg/dL    Comment: Performed at [facility], [address], [place], [place] [address]  Glucose, capillary     Status: Abnormal   Collection Time: 11/27/22  8:27 PM  Result Value Ref Range   Glucose-Capillary 285 (H) 70 - 99 mg/dL    Comment: Glucose reference range applies only to samples taken after fasting for at least 8 hours.  Glucose, capillary     Status: Abnormal   Collection Time: 11/27/22 11:34 PM  Result Value Ref Range   Glucose-Capillary 268 (H) 70 - 99 mg/dL    Comment: Glucose reference range applies only to samples taken after fasting for at least 8 hours.  I-STAT 7, (LYTES, BLD GAS, ICA, H+H)     Status: Abnormal   Collection Time: 11/27/22 11:37 PM  Result Value Ref Range   pH, Arterial 7.365 7.35 - 7.45   pCO2 arterial 49.8 (H) 32 - 48 mmHg   pO2, Arterial 107 83 - 108 mmHg   Bicarbonate 28.5 (H) 20.0 - 28.0 mmol/L   TCO2 30 22 - 32 mmol/L   O2 Saturation 98 %   Acid-Base Excess 2.0 0.0 - 2.0 mmol/L   Sodium 129 (L) 135 - 145 mmol/L   Potassium 4.5 3.5 - 5.1 mmol/L   Calcium, Ion 1.09 (L) 1.15 - 1.40 mmol/L   HCT 35.0 (L) 39.0 - 52.0 %   Hemoglobin 11.9 (L) 13.0 - 17.0 g/dL   Patient temperature 28.4 F    Sample type ARTERIAL    Procalcitonin     Status: None   Collection Time: 11/28/22  4:05 AM  Result Value Ref Range   Procalcitonin 3.95 ng/mL    Comment:        Interpretation: PCT > 2 ng/mL: Systemic infection (sepsis) is likely, unless other causes are known. (NOTE)       Sepsis PCT Algorithm           Lower Respiratory Tract                                      Infection PCT Algorithm    ----------------------------     ----------------------------         PCT < 0.25 ng/mL                PCT < 0.10 ng/mL          Strongly encourage             Strongly discourage   discontinuation of antibiotics    initiation of antibiotics    ----------------------------     -----------------------------       PCT 0.25 - 0.50 ng/mL            PCT 0.10 - 0.25 ng/mL               OR       >80% decrease in PCT            Discourage initiation of                                            antibiotics      Encourage discontinuation  of antibiotics    ----------------------------     -----------------------------         PCT >= 0.50 ng/mL              PCT 0.26 - 0.50 ng/mL               AND       <80% decrease in PCT              Encourage initiation of                                             antibiotics       Encourage continuation           of antibiotics    ----------------------------     -----------------------------        PCT >= 0.50 ng/mL                  PCT > 0.50 ng/mL               AND         increase in PCT                  Strongly encourage                                      initiation of antibiotics    Strongly encourage escalation           of antibiotics                                     -----------------------------                                           PCT <= 0.25 ng/mL                                                 OR                                        > 80% decrease in PCT                                      Discontinue / Do not initiate                                              antibiotics  Performed at [facility], [address] [place], [place] [address]   Brain natriuretic peptide     Status: Abnormal   Collection Time: 11/28/22  4:05 AM  Result Value Ref Range   B Natriuretic Peptide 2,368.5 (H)  0.0 - 100.0 pg/mL    Comment: Performed at [facility], [address] [place], [place] [address]  Comprehensive metabolic panel     Status: Abnormal   Collection Time: 11/28/22  4:05 AM  Result Value Ref Range   Sodium 124 (L) 135 - 145 mmol/L   Potassium 4.8 3.5 - 5.1 mmol/L   Chloride 89 (L) 98 - 111 mmol/L   CO2 24 22 - 32 mmol/L   Glucose, Bld 246 (H) 70 - 99 mg/dL    Comment: Glucose reference range applies only to samples taken after fasting for at least 8 hours.   BUN 87 (H) 8 - 23 mg/dL   Creatinine, Ser 6.04 (H) 0.61 - 1.24 mg/dL    Comment: DELTA CHECK NOTED   Calcium 7.6 (L) 8.9 - 10.3 mg/dL   Total Protein 5.0 (L) 6.5 - 8.1 g/dL   Albumin 2.7 (L) 3.5 - 5.0 g/dL   AST 5,409 (H) 15 - 41 U/L   ALT 1,153 (H) 0 - 44 U/L   Alkaline Phosphatase 70 38 - 126 U/L   Total Bilirubin 1.2 (H) <1.2 mg/dL   GFR, Estimated 16 (L) >60 mL/min    Comment: (NOTE) Calculated using the CKD-EPI Creatinine Equation (2021)    Anion gap 11 5 - 15    Comment: Performed at [facility], [address], [place], [place] [address]  Magnesium     Status: Abnormal   Collection Time: 11/28/22  4:05 AM  Result Value Ref Range   Magnesium 2.9 (H) 1.7 - 2.4 mg/dL    Comment: HEMOLYSIS AT THIS LEVEL MAY AFFECT RESULT Performed at [facility], [address], [place], [place] [address]   Phosphorus     Status: Abnormal   Collection Time: 11/28/22  4:05 AM  Result Value Ref Range   Phosphorus 4.9 (H) 2.5 - 4.6 mg/dL    Comment: HEMOLYSIS AT THIS LEVEL MAY AFFECT RESULT Performed at [facility], [address] [place], [place] [address]   CBC     Status: Abnormal   Collection Time: 11/28/22  4:05 AM  Result Value Ref  Range   WBC 11.2 (H) 4.0 - 10.5 K/uL   RBC 3.27 (L) 4.22 - 5.81 MIL/uL   Hemoglobin 11.1 (L) 13.0 - 17.0 g/dL   HCT 08.6 (L) 57.8 - 46.9 %   MCV 100.6 (H) 80.0 - 100.0 fL   MCH 33.9 26.0 - 34.0 pg   MCHC 33.7 30.0 - 36.0 g/dL   RDW 62.9 52.8 - 41.3 %   Platelets 37 (L) 150 - 400 K/uL    Comment: SPECIMEN CHECKED FOR CLOTS Immature Platelet Fraction may be clinically indicated, consider ordering this additional test [id number] REPEATED TO VERIFY PLATELET COUNT CONFIRMED BY SMEAR    nRBC 0.2 0.0 - 0.2 %    Comment: Performed at [facility], [address], [place], [place] [address]  Glucose, capillary     Status: Abnormal   Collection Time: 11/28/22  4:08 AM  Result Value Ref Range   Glucose-Capillary 234 (H) 70 - 99 mg/dL    Comment: Glucose reference range applies only to samples taken after fasting for at least 8 hours.  Glucose, capillary     Status: Abnormal   Collection Time: 11/28/22  7:53 AM  Result Value Ref Range   Glucose-Capillary 214 (H) 70 - 99 mg/dL    Comment: Glucose reference range applies only to samples taken  after fasting for at least 8 hours.  I-STAT 7, (LYTES, BLD GAS, ICA, H+H)     Status: Abnormal   Collection Time: 11/28/22  8:03 AM  Result Value Ref Range   pH, Arterial 7.365 7.35 - 7.45   pCO2 arterial 47.2 32 - 48 mmHg   pO2, Arterial 72 (L) 83 - 108 mmHg   Bicarbonate 27.3 20.0 - 28.0 mmol/L   TCO2 29 22 - 32 mmol/L   O2 Saturation 94 %   Acid-Base Excess 1.0 0.0 - 2.0 mmol/L   Sodium 128 (L) 135 - 145 mmol/L   Potassium 4.7 3.5 - 5.1 mmol/L   Calcium, Ion 1.09 (L) 1.15 - 1.40 mmol/L   HCT 33.0 (L) 39.0 - 52.0 %   Hemoglobin 11.2 (L) 13.0 - 17.0 g/dL   Patient temperature 40.1 F    Sample type ARTERIAL   I-STAT 7, (LYTES, BLD GAS, ICA, H+H)     Status: Abnormal   Collection Time: 11/28/22 10:43 AM  Result Value Ref Range   pH, Arterial 7.337 (L) 7.35 - 7.45   pCO2 arterial 42.8 32 - 48 mmHg   pO2, Arterial 41 (L) 83 - 108 mmHg    Bicarbonate 23.1 20.0 - 28.0 mmol/L   TCO2 24 22 - 32 mmol/L   O2 Saturation 74 %   Acid-base deficit 3.0 (H) 0.0 - 2.0 mmol/L   Sodium 127 (L) 135 - 145 mmol/L   Potassium 4.9 3.5 - 5.1 mmol/L   Calcium, Ion 1.11 (L) 1.15 - 1.40 mmol/L   HCT 33.0 (L) 39.0 - 52.0 %   Hemoglobin 11.2 (L) 13.0 - 17.0 g/dL   Patient temperature 02.7 F    Sample type ARTERIAL   Glucose, capillary     Status: Abnormal   Collection Time: 11/28/22 11:11 AM  Result Value Ref Range   Glucose-Capillary 197 (H) 70 - 99 mg/dL    Comment: Glucose reference range applies only to samples taken after fasting for at least 8 hours.  Glucose, capillary     Status: Abnormal   Collection Time: 11/28/22  5:22 PM  Result Value Ref Range   Glucose-Capillary 104 (H) 70 - 99 mg/dL    Comment: Glucose reference range applies only to samples taken after fasting for at least 8 hours.  Glucose, capillary     Status: Abnormal   Collection Time: 11/28/22  8:37 PM  Result Value Ref Range   Glucose-Capillary 60 (L) 70 - 99 mg/dL    Comment: Glucose reference range applies only to samples taken after fasting for at least 8 hours.  Glucose, capillary     Status: Abnormal   Collection Time: 11/28/22  8:48 PM  Result Value Ref Range   Glucose-Capillary 203 (H) 70 - 99 mg/dL    Comment: Glucose reference range applies only to samples taken after fasting for at least 8 hours.  Glucose, capillary     Status: Abnormal   Collection Time: 11/29/22 12:00 AM  Result Value Ref Range   Glucose-Capillary 178 (H) 70 - 99 mg/dL    Comment: Glucose reference range applies only to samples taken after fasting for at least 8 hours.  CBC     Status: Abnormal   Collection Time: 11/29/22  4:13 AM  Result Value Ref Range   WBC 12.8 (H) 4.0 - 10.5 K/uL   RBC 3.36 (L) 4.22 - 5.81 MIL/uL   Hemoglobin 11.5 (L) 13.0 - 17.0 g/dL   HCT 25.3 (L) 66.4 - 40.3 %  MCV 103.6 (H) 80.0 - 100.0 fL   MCH 34.2 (H) 26.0 - 34.0 pg   MCHC 33.0 30.0 - 36.0 g/dL    RDW 96.0 45.4 - 09.8 %   Platelets 39 (L) 150 - 400 K/uL    Comment: Immature Platelet Fraction may be clinically indicated, consider ordering this additional test [id number] REPEATED TO VERIFY    nRBC 0.9 (H) 0.0 - 0.2 %    Comment: Performed at [facility], [address], [place], [place] [address]  Glucose, capillary     Status: Abnormal   Collection Time: 11/29/22  4:19 AM  Result Value Ref Range   Glucose-Capillary 207 (H) 70 - 99 mg/dL    Comment: Glucose reference range applies only to samples taken after fasting for at least 8 hours.  Basic metabolic panel     Status: Abnormal   Collection Time: 11/29/22  5:03 AM  Result Value Ref Range   Sodium 129 (L) 135 - 145 mmol/L   Potassium 5.8 (H) 3.5 - 5.1 mmol/L   Chloride 93 (L) 98 - 111 mmol/L   CO2 23 22 - 32 mmol/L   Glucose, Bld 217 (H) 70 - 99 mg/dL    Comment: Glucose reference range applies only to samples taken after fasting for at least 8 hours.   BUN 131 (H) 8 - 23 mg/dL   Creatinine, Ser 3.08 (H) 0.61 - 1.24 mg/dL   Calcium 8.1 (L) 8.9 - 10.3 mg/dL   GFR, Estimated 11 (L) >60 mL/min    Comment: (NOTE) Calculated using the CKD-EPI Creatinine Equation (2021)    Anion gap 13 5 - 15    Comment: Performed at [facility], [address], [place], [place] [address]  Magnesium     Status: Abnormal   Collection Time: 11/29/22  5:03 AM  Result Value Ref Range   Magnesium 3.7 (H) 1.7 - 2.4 mg/dL    Comment: Performed at [facility], [address], [place], [place] [address]  Phosphorus     Status: Abnormal   Collection Time: 11/29/22  5:03 AM  Result Value Ref Range   Phosphorus 7.1 (H) 2.5 - 4.6 mg/dL    Comment: Performed at [facility], [address] [place], [place] [address]  Glucose, capillary     Status: Abnormal   Collection Time: 11/29/22  7:59 AM  Result Value Ref Range   Glucose-Capillary 218 (H) 70 - 99 mg/dL    Comment: Glucose reference range applies only to samples taken  after fasting for at least 8 hours.   *Note: Due to a large number of results and/or encounters for the requested time period, some results have not been displayed. A complete set of results can be found in Results Review.    No results found.  Assessment/Plan **Hypoxic respiratory failure: chronic ILD attributed to UIP now with acute worsening with DAH.  On course of high dose steroids.  Has had some modest improvements.  Cultures pending.  Volume doesn't seem to be an issue at this time. Dialysis per below.   **AKI: normal kidney function now with severe oliguric AKI after hypotension and contrast administration.  DAH raises concern for pulmonary renal syndrome but I doubt given the time course ATN/contrast much more likely.  Send UA.   Prior serologies haven't included ANCA test - will send if UA abnormal.  D/w family adding dialysis to therapies as part of their time limited trial --> in setting  of BUN > 130 and ongoing blood secretions I consider this uremia an indication for RRT.  I was up front with family that he's a very poor long term dialysis candidate but I hope with some time his kidneys may recover.   Will use 4K fluids, run net even for now.    **A fib: anticoag on hold for DAH  **Transaminitis: presumed shock liver, improving.   **HFrEF:  30-35%, volume ok, run net even for now.   Will follow, reach out w concerns.  D/w primary MD at length and son Will this AM.   Tyler Pita 11/29/2022, 10:30 AM

## 2022-11-29 NOTE — Procedures (Signed)
I was present at this dialysis session, have reviewed the session itself and made  appropriate changes  CRRT started about 3 hours ago - stopped by RN says all running well Running net even for now R fem temp HD catheter placed by PCCM running well   Estill Bakes MD [facility] pager [phone]   11/29/2022, 4:20 PM

## 2022-11-30 DIAGNOSIS — R739 Hyperglycemia, unspecified: Secondary | ICD-10-CM

## 2022-11-30 DIAGNOSIS — J9601 Acute respiratory failure with hypoxia: Secondary | ICD-10-CM | POA: Diagnosis not present

## 2022-11-30 DIAGNOSIS — N179 Acute kidney failure, unspecified: Secondary | ICD-10-CM | POA: Diagnosis not present

## 2022-11-30 LAB — POCT I-STAT, CHEM 8
BUN: 93 mg/dL — ABNORMAL HIGH (ref 8–23)
BUN: >130 mg/dL — ABNORMAL HIGH (ref 8–23)
BUN: 128 mg/dL — ABNORMAL HIGH (ref 8–23)
Calcium, Ion: 0.89 mmol/L — CL (ref 1.15–1.40)
Calcium, Ion: <0.3 mmol/L — CL (ref 1.15–1.40)
Calcium, Ion: 0.88 mmol/L — CL (ref 1.15–1.40)
Chloride: 95 mmol/L — ABNORMAL LOW (ref 98–111)
Chloride: 96 mmol/L — ABNORMAL LOW (ref 98–111)
Chloride: 98 mmol/L (ref 98–111)
Creatinine, Ser: 2.2 mg/dL — ABNORMAL HIGH (ref 0.61–1.24)
Creatinine, Ser: 4.2 mg/dL — ABNORMAL HIGH (ref 0.61–1.24)
Creatinine, Ser: 3.8 mg/dL — ABNORMAL HIGH (ref 0.61–1.24)
Glucose, Bld: 255 mg/dL — ABNORMAL HIGH (ref 70–99)
Glucose, Bld: 276 mg/dL — ABNORMAL HIGH (ref 70–99)
Glucose, Bld: 257 mg/dL — ABNORMAL HIGH (ref 70–99)
HCT: 35 % — ABNORMAL LOW (ref 39.0–52.0)
HCT: 41 % (ref 39.0–52.0)
HCT: 38 % — ABNORMAL LOW (ref 39.0–52.0)
Hemoglobin: 11.9 g/dL — ABNORMAL LOW (ref 13.0–17.0)
Hemoglobin: 13.9 g/dL (ref 13.0–17.0)
Hemoglobin: 12.9 g/dL — ABNORMAL LOW (ref 13.0–17.0)
Potassium: 4.5 mmol/L (ref 3.5–5.1)
Potassium: 5.5 mmol/L — ABNORMAL HIGH (ref 3.5–5.1)
Potassium: 6.2 mmol/L — ABNORMAL HIGH (ref 3.5–5.1)
Sodium: 131 mmol/L — ABNORMAL LOW (ref 135–145)
Sodium: 135 mmol/L (ref 135–145)
Sodium: 129 mmol/L — ABNORMAL LOW (ref 135–145)
TCO2: 22 mmol/L (ref 22–32)
TCO2: 23 mmol/L (ref 22–32)
TCO2: 26 mmol/L (ref 22–32)

## 2022-11-30 LAB — COMPREHENSIVE METABOLIC PANEL
ALT: 792 U/L — ABNORMAL HIGH (ref 0–44)
AST: 324 U/L — ABNORMAL HIGH (ref 15–41)
Albumin: 2.3 g/dL — ABNORMAL LOW (ref 3.5–5.0)
Alkaline Phosphatase: 91 U/L (ref 38–126)
Anion gap: 12 (ref 5–15)
BUN: 120 mg/dL — ABNORMAL HIGH (ref 8–23)
CO2: 21 mmol/L — ABNORMAL LOW (ref 22–32)
Calcium: 6.9 mg/dL — ABNORMAL LOW (ref 8.9–10.3)
Chloride: 99 mmol/L (ref 98–111)
Creatinine, Ser: 4.26 mg/dL — ABNORMAL HIGH (ref 0.61–1.24)
GFR, Estimated: 13 mL/min — ABNORMAL LOW (ref >60)
Glucose, Bld: 239 mg/dL — ABNORMAL HIGH (ref 70–99)
Potassium: 5.6 mmol/L — ABNORMAL HIGH (ref 3.5–5.1)
Sodium: 132 mmol/L — ABNORMAL LOW (ref 135–145)
Total Bilirubin: 1.3 mg/dL — ABNORMAL HIGH (ref <1.2)
Total Protein: 4.6 g/dL — ABNORMAL LOW (ref 6.5–8.1)

## 2022-11-30 LAB — CBC
HCT: 33.3 % — ABNORMAL LOW (ref 39.0–52.0)
Hemoglobin: 11 g/dL — ABNORMAL LOW (ref 13.0–17.0)
MCH: 34.4 pg — ABNORMAL HIGH (ref 26.0–34.0)
MCHC: 33 g/dL (ref 30.0–36.0)
MCV: 104.1 fL — ABNORMAL HIGH (ref 80.0–100.0)
Platelets: 60 10*3/uL — ABNORMAL LOW (ref 150–400)
RBC: 3.2 MIL/uL — ABNORMAL LOW (ref 4.22–5.81)
RDW: 15.3 % (ref 11.5–15.5)
WBC: 12.8 10*3/uL — ABNORMAL HIGH (ref 4.0–10.5)
nRBC: 15.1 % — ABNORMAL HIGH (ref 0.0–0.2)

## 2022-11-30 LAB — POCT I-STAT 7, (LYTES, BLD GAS, ICA,H+H)
Acid-base deficit: 8 mmol/L — ABNORMAL HIGH (ref 0.0–2.0)
Bicarbonate: 24 mmol/L (ref 20.0–28.0)
Calcium, Ion: 0.88 mmol/L — CL (ref 1.15–1.40)
HCT: 37 % — ABNORMAL LOW (ref 39.0–52.0)
Hemoglobin: 12.6 g/dL — ABNORMAL LOW (ref 13.0–17.0)
O2 Saturation: 95 %
Patient temperature: 99.2
Potassium: 6.3 mmol/L (ref 3.5–5.1)
Sodium: 129 mmol/L — ABNORMAL LOW (ref 135–145)
TCO2: 26 mmol/L (ref 22–32)
pCO2 arterial: 84.1 mm[Hg] (ref 32–48)
pH, Arterial: 7.066 — CL (ref 7.35–7.45)
pO2, Arterial: 109 mm[Hg] — ABNORMAL HIGH (ref 83–108)

## 2022-11-30 LAB — GLUCOSE, CAPILLARY
Glucose-Capillary: 215 mg/dL — ABNORMAL HIGH (ref 70–99)
Glucose-Capillary: 240 mg/dL — ABNORMAL HIGH (ref 70–99)
Glucose-Capillary: 250 mg/dL — ABNORMAL HIGH (ref 70–99)

## 2022-11-30 LAB — MAGNESIUM: Magnesium: 3.6 mg/dL — ABNORMAL HIGH (ref 1.7–2.4)

## 2022-11-30 LAB — PHOSPHORUS: Phosphorus: 3.8 mg/dL (ref 2.5–4.6)

## 2022-11-30 MED ORDER — PRISMASOL B22GK 4/0 22-4 MEQ/L REPLACEMENT SOLN
Status: DC
  Filled 2022-11-30 (×3): qty 5000

## 2022-11-30 MED ORDER — MIDAZOLAM HCL 2 MG/2ML IJ SOLN
2.0000 mg | INTRAMUSCULAR | Status: DC | PRN
  Administered 2022-11-30: 4 mg via INTRAVENOUS
  Filled 2022-11-30: qty 4

## 2022-11-30 MED ORDER — GLYCOPYRROLATE 1 MG PO TABS
1.0000 mg | ORAL_TABLET | ORAL | Status: DC | PRN
Start: 2022-11-30 — End: 2022-11-30

## 2022-11-30 MED ORDER — SODIUM CHLORIDE 0.9 % IV SOLN: INTRAVENOUS | Status: DC

## 2022-11-30 MED ORDER — GLYCOPYRROLATE 0.2 MG/ML IJ SOLN
0.2000 mg | INTRAMUSCULAR | Status: DC | PRN
  Administered 2022-11-30: 0.2 mg via INTRAVENOUS
  Filled 2022-11-30: qty 1

## 2022-11-30 MED ORDER — ACETAMINOPHEN 325 MG PO TABS
650.0000 mg | ORAL_TABLET | Freq: Four times a day (QID) | ORAL | Status: DC | PRN
Start: 2022-11-30 — End: 2022-11-30

## 2022-11-30 MED ORDER — HYDROMORPHONE HCL-NACL 50-0.9 MG/50ML-% IV SOLN
0.0000 mg/h | INTRAVENOUS | Status: DC
  Administered 2022-11-30: 0.5 mg/h via INTRAVENOUS
  Filled 2022-11-30: qty 50

## 2022-11-30 MED ORDER — HEPARIN SODIUM (PORCINE) 1000 UNIT/ML DIALYSIS
1000.0000 [IU] | INTRAMUSCULAR | Status: DC | PRN
  Filled 2022-11-30: qty 6

## 2022-11-30 MED ORDER — HYDROMORPHONE BOLUS VIA INFUSION: 1.0000 mg | INTRAVENOUS | Status: DC | PRN

## 2022-11-30 MED ORDER — PRISMASOL B22GK 4/0 22-4 MEQ/L EC SOLN
Status: DC
  Filled 2022-11-30 (×10): qty 5000

## 2022-11-30 MED ORDER — GLYCOPYRROLATE 0.2 MG/ML IJ SOLN: 0.2000 mg | INTRAMUSCULAR | Status: DC | PRN

## 2022-11-30 MED ORDER — POLYVINYL ALCOHOL 1.4 % OP SOLN: 1.0000 [drp] | Freq: Four times a day (QID) | OPHTHALMIC | Status: DC | PRN

## 2022-11-30 MED ORDER — DEXTROSE 5 % IV SOLN
20.0000 g | INTRAVENOUS | Status: DC
  Administered 2022-11-30: 20 g via INTRAVENOUS_CENTRAL
  Filled 2022-11-30 (×2): qty 200

## 2022-11-30 MED ORDER — ACD FORMULA A 0.73-2.45-2.2 GM/100ML VI SOLN
3000.0000 mL | Status: DC
  Administered 2022-11-30: 3000 mL via INTRAVENOUS_CENTRAL
  Filled 2022-11-30: qty 3000

## 2022-11-30 MED ORDER — HYDROMORPHONE HCL 1 MG/ML IJ SOLN
1.0000 mg | Freq: Once | INTRAMUSCULAR | Status: AC
  Administered 2022-11-30: 1 mg via INTRAVENOUS
  Filled 2022-11-30: qty 1

## 2022-11-30 MED ORDER — ACETAMINOPHEN 650 MG RE SUPP: 650.0000 mg | Freq: Four times a day (QID) | RECTAL | Status: DC | PRN

## 2022-12-01 LAB — CALCIUM, IONIZED: Calcium, Ionized, Serum: 3.6 mg/dL — ABNORMAL LOW (ref 4.5–5.6)

## 2022-12-03 LAB — PNEUMOCYSTIS JIROVECI SMEAR BY DFA

## 2022-12-15 NOTE — Progress Notes (Signed)
NAME:  [name], MRN:  [id number], DOB:  1940/12/10, LOS: 5 ADMISSION DATE:  12/06/2022, CONSULTATION DATE:  12/05/2022  REFERRING MD: Alinda Money - TRH, CHIEF COMPLAINT: Cough, SOB, hemoptysis  History of Present Illness:  82 year old man who presented to [facility] 11/10 for hemoptysis. PMHx significant for HTN, HLD, CAD, Afib (on Xarelto), VT, CHF (Echo 05/2021 EF 30-35%, G1DD), ICM with pacemaker/AICD in place, OSA, COPD, ILD (UIP), TIA, GERD. Recently seen by [facility] (Dr. Marchelle Gearing) 10/22 for hemoptysis, persistent SOB/cough since 08/2022.   Presented to [facility] ED 11/10 for persistent SOB/cough with hemoptysis, recently treated with steroids/antibiotics (doxy, then Keflex). SpO2 in ED initially 78% on RA. Hypotensive with SBP 90s-100s. Labs were notable for WBC 11.6, Hgb 14.7, Plt 244. Na 131, K 4.8, CO2 20, Cr 0.92. BNP 477. Mild troponin leak (27), COVID/Flu negative. CXR with ?pulmonary edema, acute-on-chronic changes. CTA Chest PE protocol negative for PE, +diffuse GGOs with septal thickening, ?atypical infection versus DAH. Broad-spectrum antibiotics started. Initially management with HHFNC, BiPAP (limited by anxiety), ultimately requited intubation 11/12.  Patient remains critically ill in ICU.  Pertinent  Medical History   Past Medical History:  Diagnosis Date   Adenomatous colon polyp 2009; 06/2012   Colonoscopy 07/2007---Dr. Maryjane Hurter GI--Repeat 06/2012 showed one tubular adenoma w/out high grade dysplasia.   AICD (automatic cardioverter/defibrillator) present    Environmental manager   Atrial fibrillation (HCC)    Bilateral sensorineural hearing loss    BPH (benign prostatic hypertrophy)    PSAs ok (followed by urologist): no LUTS   CAD (coronary artery disease)    PTCA RCA 1997 (Inf/post MI);   CHF (congestive heart failure) (HCC)    Chronic constipation    Chronic rhinitis    ?vasomotor (Dr. Lazarus Salines)   COPD (chronic obstructive pulmonary disease) (HCC)    Diverticulosis of colon 2005    Noted on Colonoscopies in 2005 and 2009 and on CT 2010   Gallstones 06/2014   No evidence of cholecystitis on abd u/s 06/2014   GERD (gastroesophageal reflux disease)    Hepatic steatosis 2010; 2016   Noted on CT abd 2010 and on u/s abd 04/2009 (transaminasemia) and u/s 06/2014.   Hyperlipidemia    Hypertension    Ischemic cardiomyopathy    Microscopic hematuria    Cysto neg 02/2009 except for BPH-likely has microhem from his renal stone dz   Nephrolithiasis    No distal stones on CT 2010; stable 8 mm RLP stone, 3mm LMP stone 10/2013 plain film.  Abd u/s 06/2014: 9 mm nonobstructing lower pole right renal stone.  03/2015 urol: stable nonobstructing renal calc by CT 12/2014 and KUB 03/2015.   Obstructive sleep apnea    no cpap uses since weight loss of 50 pounds   Presence of permanent cardiac pacemaker    [employer]   Squamous cell carcinoma of back    Ventricular tachycardia (HCC)     Significant Hospital Events: Including procedures, antibiotic start and stop dates in addition to other pertinent events   11/10 Admitted w/ hemoptysis and acute hypoxic resp failure. Sats 70s on room air. Moved to ICU given concern for high risk intubation., Started on CAP coverage. Made NPO for FOB. O2 needs up to 15 liters so Xarelto stopped 11/11 Resp viral panel neg, covid neg. MRSA neg. Systemic steroids started. Sputum x 2 sent back w/ report not adequate specimen. His urine studies suggested hypovolemia vs ineffective renal perf. Not really overload. Had to be placed on NIPPV at night.  Required precedex as seemed like sundowned. Inc'd solumed to 500 q 12.  11/12 - Intubated, RIJ CVC placed. Bronch completed with increasingly bloody secretions (RUL). Superior segment blood-tinged. R anterior pigtail chest tube placed for 20-30% R PTX. A-line inserted. Epi and vaso started. 11/14 lasix challenge, no response 11/15 worsening renal function and mental status, off sedation. Started on CRRT.   Interim  History / Subjective:  Started on short term trial of CRRT. Remains unresponsive. Developing significant auto-PEEP.   Objective:  Blood pressure (!) 88/55, pulse 66, temperature 99.4 F (37.4 C), temperature source Axillary, resp. rate (!) 23, height 5\' 10"  (1.778 m), weight 90.9 kg, SpO2 95%. CVP:  [3 mmHg-14 mmHg] 6 mmHg  Vent Mode: PRVC FiO2 (%):  [50 %] 50 % Set Rate:  [20 bmp-32 bmp] 20 bmp Vt Set:  [430 mL-500 mL] 500 mL PEEP:  [10 cmH20] 10 cmH20 Plateau Pressure:  [14 cmH20-27 cmH20] 27 cmH20   Intake/Output Summary (Last 24 hours) at 11/28/2022 0939 Last data filed at 12/06/2022 0900 Gross per 24 hour  Intake 3088.44 ml  Output 1841.78 ml  Net 1246.66 ml   Filed Weights   11/28/22 0500 11/29/22 0500 11/26/2022 0500  Weight: 86.4 kg 87.5 kg 90.9 kg   Physical Examination: General: Elderly male, critically ill, in  NAD. HEENT: [place]/AT. Sclerae anicteric. ETT in place. NGT in place.  Cardiovascular: RRR, no M/R/G. On increasing NE/E doses.  Lungs: increased respiratory effort in spite of sedation. Now diffuse crackles anteriorly R>>L. R anterior chest tube in place, no air leak. No subcutaneous emphysema. ++ Auto-PEEP Abdomen: no distension Musculoskeletal: No gross deformities, 1+ edema.  Skin: Intact, cool extremities, Fingers severely mottled.   Ancillary Tests Personally Reviewed:  Remains hyperkalemic in spite of CRRT ABG show increasing pCO2 Increasing BNP  Cultures are negative.   Assessment & Plan:   Acute hypoxic respiratory failure due to diffuse alveolar hemorrhage related to flare of UIP. HFrEF due to NICM with ICD Atrial fibrillation Acute kidney injury due to contrast nephropathy and ATN. Transaminitis from shock liver Steroid-induced hyperglycemia.  Plan:   - Unable to adequately ventilate resulting in auto-PEEP causing high airway pressure. Marked hypercarbia once RR decreased to reduce airway pressures.  - Unable to tolerate CRRT without  increasing pressors, unable to remove fluid. - No evidence of untreated infection.  - I have spoken to patient's son and told him that I believe that will have reached an impasse and I don't think that there is any realistic path to recovery given the severity of his current lung injury and the poor prognosis of such a severe ILD flare and the patient's advanced HF. As such, in keeping with the patient's wishes, that we should transition to comfort care.    Best Practice (right click and "Reselect all SmartList Selections" daily)   Diet/type: tubefeeds DVT prophylaxis: SCD GI prophylaxis: PPI Lines: N/A Foley:  N/A Code Status:  full code Last date of multidisciplinary goals of care discussion [pending]  CRITICAL CARE Performed by: Lynnell Catalan   Total critical care time: 40 minutes  Critical care time was exclusive of separately billable procedures and treating other patients.  Critical care was necessary to treat or prevent imminent or life-threatening deterioration.  Critical care was time spent personally by me on the following activities: development of treatment plan with patient and/or surrogate as well as nursing, discussions with consultants, evaluation of patient's response to treatment, examination of patient, obtaining history from patient or surrogate, ordering  and performing treatments and interventions, ordering and review of laboratory studies, ordering and review of radiographic studies, pulse oximetry, re-evaluation of patient's condition and participation in multidisciplinary rounds.  Lynnell Catalan, MD [facility] ICU Physician [facility]  Critical Care  Pager: [phone] Mobile: [phone] After hours: [phone].

## 2022-12-15 NOTE — IPAL (Signed)
  Interdisciplinary Goals of Care Family Meeting   Date carried out: 12/12/2022  Location of the meeting: Bedside  Member's involved: Physician, Bedside Registered Nurse, and Family Member or next of kin  Durable Power of Attorney or acting medical decision maker: sons and patient's wife,     Discussion: We discussed goals of care for [name] .   I believe that will have reached an impasse and I don't think that there is any realistic path to recovery given the severity of his current lung injury and the poor prognosis of such a severe ILD flare and the patient's advanced HF. As such, in keeping with the patient's wishes, that we should transition to comfort care. The family was unanimous in their agreement with proceeding with comfort care.   Code status:   Code Status: Do not attempt resuscitation (DNR) - Comfort care   Disposition: In-patient comfort care  Time spent for the meeting: 15 min    Lynnell Catalan, MD  11/28/2022, 11:11 AM

## 2022-12-15 NOTE — Procedures (Signed)
Extubation Procedure Note  Patient Details:   Name: [name] DOB: 08-01-1940 MRN: [id number]   Airway Documentation:    Vent end date: 11/27/2022 Vent end time: 1301   Evaluation  O2 sats: stable throughout Complications: No apparent complications Patient did tolerate procedure well. Bilateral Breath Sounds: Clear, Diminished   No  Pt had a compassionate extubation per order.   Kerri Perches 11/17/2022, 1:01 PM

## 2022-12-15 NOTE — Discharge Summary (Signed)
DEATH SUMMARY   Patient Details  Name: [name] MRN: [id number] DOB: [birth year]  Admission/Discharge Information   Admit Date:  11/28/2022  Date of Death: Date of Death: 2022/12/01  Time of Death: Time of Death: 1318  Length of Stay: 5  Referring Physician: Sheliah Hatch, MD   Reason(s) for Hospitalization  Hemoptysis   Diagnoses  Preliminary cause of death: diffuse alveolar hemorrhage.  Secondary Diagnoses (including complications and co-morbidities):  Principal Problem:   Acute respiratory failure with hypoxia (HCC) Active Problems:   Hyperlipidemia   Obstructive sleep apnea   Essential hypertension   Atrial fibrillation (HCC)   Chronic combined systolic and diastolic heart failure (HCC)   CAD (coronary artery disease)   Mild cognitive impairment   COPD (chronic obstructive pulmonary disease) (HCC)   Gastroesophageal reflux disease   IPF (idiopathic pulmonary fibrosis) (HCC)   Cough with hemoptysis   Acute exacerbation of idiopathic pulmonary fibrosis (HCC)   Malnutrition of moderate degree   AKI (acute kidney injury) [facility])   Brief Hospital Course (including significant findings, care, treatment, and services provided and events leading to death)  [name] is a 82 y.o. year old male who was followed for longstanding UIP who had experienced increasing dyspnea and hemoptysis over the preceding two weeks.  A CT showed diffuse ground glass opacifications. He failed heated high flow nasal cannula, BiPAP and eventually required intubation.  Family understood from the outset that his prognosis was poor but they were willing to try a short trial of intubation to see if the patient would respond to antibiotics and steroids.  Complicating matters further the patient had a history of HFrEF with an EF of 30% with an ICD in place and severe COPD.  There is evidence of right heart failure on echo.  Following intubation he received a pulse steroids and serial  bronchoalveolar lavage was consistent with diffuse alveolar hemorrhage.  His oxygenation improved initially but he developed worsening renal failure and volume overload.  We tried him on CRRT but he was unable to tolerate it due to hypotension likely as result of his severe heart failure.  Given no significant improvement after a week trial of mechanical ventilation and worsening renal failure with inability to tolerate renal replacement therapy, it was decided to transition to comfort care.  Pertinent Labs and Studies  Significant Diagnostic Studies DG CHEST PORT 1 VIEW  Result Date: 11/29/2022 CLINICAL DATA:  Respiratory failure with hypoxia. EXAM: PORTABLE CHEST 1 VIEW COMPARISON:  11/27/2022 FINDINGS: Endotracheal tube is 2.8 cm above the carina. Nasogastric tube extends into the abdomen but the tip is beyond the image. Right jugular central line is near the superior cavoatrial junction. Left chest ICD. Interstitial/airspace lung densities are minimally changed. Slightly increased densities in the retrocardiac space. Again noted is a right chest pigtail drain. No significant pneumothorax. IMPRESSION: 1. Bilateral interstitial/airspace densities and slightly increased densities at the left lung base. 2. Right chest pigtail drain without pneumothorax. Electronically Signed   By: Richarda Overlie M.D.   On: 11/29/2022 16:09   DG Chest Port 1 View  Result Date: 11/27/2022 CLINICAL DATA:  [id number] Pneumonitis [id number]. EXAM: PORTABLE CHEST 1 VIEW COMPARISON:  Chest radiograph 11/26/2022. FINDINGS: Left chest AICD/biventricular pacer with leads projecting over the right atrium, right ventricle and coronary sinus. Unchanged endotracheal tube with tip projecting over the lower thoracic trachea. Unchanged right IJ approach central venous catheter with tip projecting over the superior cavoatrial junction. Unchanged right pleural drainage catheter with  decreased size of the associated right apical pneumothorax, now  with trace residual. Unchanged mild diffuse bilateral airspace disease. IMPRESSION: 1. Unchanged right pleural drainage catheter with decreased size of the associated right apical pneumothorax, now with trace residual. 2. Unchanged mild diffuse bilateral airspace disease. Electronically Signed   By: Orvan Falconer M.D.   On: 11/27/2022 11:22   DG CHEST PORT 1 VIEW  Result Date: 11/26/2022 CLINICAL DATA:  Chest tube EXAM: PORTABLE CHEST 1 VIEW COMPARISON:  Chest x-ray 11/26/2022 FINDINGS: New right-sided chest tube present. Right-sided pneumothorax has significantly decreased. Tiny right apical pneumothorax persists. Cardiomegaly in central pulmonary vascular congestion persists along with hazy opacities. Heart is enlarged, unchanged. Endotracheal tube tip is 1 cm above the carina. Enteric tube extends below the diaphragm. Right IJ catheter tip ends in the SVC. Left-sided pacemaker is again noted. IMPRESSION: 1. New right-sided chest tube with significant decrease in right-sided pneumothorax. Tiny right apical pneumothorax persists. 2. Stable cardiomegaly with central pulmonary vascular congestion and hazy opacities. 3. Endotracheal tube tip is 1 cm above the carina. Electronically Signed   By: Darliss Cheney M.D.   On: 11/26/2022 21:20   DG Abd 1 View  Result Date: 11/26/2022 CLINICAL DATA:  G-tube EXAM: ABDOMEN - 1 VIEW COMPARISON:  None Available. FINDINGS: Two low resolution spot views of the upper abdomen. 15 mL Omnipaque 300 contrast utilized. Images demonstrate contrast opacification of the stomach. Tip of the enteral tube appears over the second portion of duodenum IMPRESSION: Tip of the enteral tube appears over the second portion of duodenum. Electronically Signed   By: Jasmine Pang M.D.   On: 11/26/2022 20:42   DG CHEST PORT 1 VIEW  Result Date: 11/26/2022 CLINICAL DATA:  Post bronchoscopy.  Evaluate lungs. EXAM: PORTABLE CHEST 1 VIEW COMPARISON:  11/26/2022 FINDINGS: New right pneumothorax  measuring 20-30%. Endotracheal tube is appropriately positioned. Tip of the nasogastric tube probably near the GE junction and similar to the recent comparison examination. Diffuse interstitial lung densities are similar to the prior examination. Again noted is a left chest ICD. Heart size is grossly stable. Right jugular central line tip in the lower SVC region. IMPRESSION: 1. New right pneumothorax measuring 20-30%. 2. Diffuse interstitial lung densities are similar to the prior examination. These results were discussed on Epic Secure Chat at the time of interpretation on 11/26/2022 at 5:07 pm to provider Harshal Sirmon , who acknowledged these results. Electronically Signed   By: Richarda Overlie M.D.   On: 11/26/2022 17:09   DG CHEST PORT 1 VIEW  Result Date: 11/26/2022 CLINICAL DATA:  Encounter for central line placement. Encounter for intubation. EXAM: PORTABLE CHEST 1 VIEW COMPARISON:  11/26/2022 FINDINGS: Endotracheal tube is 5.1 cm above the carina. Nasogastric tube tip in the lower esophagus region near the GE junction. Stable appearance of the left chest ICD. Diffuse interstitial lung densities are unchanged. Again noted are low lung volumes. Heart size is stable. Right jugular central line tip is near the superior cavoatrial junction. No evidence for pneumothorax. IMPRESSION: 1. Endotracheal tube is appropriately position. 2. Nasogastric tube tip is in the lower esophagus region. 3. Right jugular central line tip near the superior cavoatrial junction. No evidence for pneumothorax. 4. Diffuse interstitial lung densities are unchanged. Electronically Signed   By: Richarda Overlie M.D.   On: 11/26/2022 17:02   DG Chest Port 1 View  Result Date: 11/26/2022 CLINICAL DATA:  Acute hypoxemic respiratory failure. EXAM: PORTABLE CHEST 1 VIEW COMPARISON:  November 24, 2022. FINDINGS:  Stable cardiomediastinal silhouette. Left-sided defibrillator is unchanged. Elevated right mid diaphragm is noted. Stable bilateral lung  opacities are noted concerning for edema or possibly atypical pneumonia. IMPRESSION: Stable bilateral lung opacities as noted above. Electronically Signed   By: Lupita Raider M.D.   On: 11/26/2022 12:10   ECHOCARDIOGRAM COMPLETE  Result Date: 11/26/2022    ECHOCARDIOGRAM REPORT   Patient Name:   CAPERS ZIERDEN Date of Exam: 11/26/2022 Medical Rec #:  [id number]     Height:       70.0 in Accession #:    [id number]    Weight:       180.6 lb Date of Birth:  [birth year]      BSA:          1.999 m Patient Age:    82 years      BP:           100/64 mmHg Patient Gender: M             HR:           65 bpm. Exam Location:  Inpatient Procedure: 2D Echo, Cardiac Doppler and Color Doppler Indications:    Dyspnea R06.00  History:        Patient has prior history of Echocardiogram examinations, most                 recent 05/28/2021. Cardiomyopathy, CAD, Defibrillator and                 Pacemaker, COPD; Arrythmias:Atrial Fibrillation.  Sonographer:    Darlys Gales Referring Phys: [phone] PETER E BABCOCK IMPRESSIONS  1. Left ventricular ejection fraction, by estimation, is <20%. The left ventricle has severely decreased function. The left ventricle demonstrates regional wall motion abnormalities (see scoring diagram/findings for description). The left ventricular internal cavity size was severely dilated. There is mild concentric left ventricular hypertrophy. Left ventricular diastolic function could not be evaluated. There is dyskinesis of the left ventricular, mid anterolateral wall and anterior wall. There is akinesis of the left ventricular, entire inferior wall and inferolateral wall.  2. Right ventricular systolic function is severely reduced. The right ventricular size is severely enlarged.  3. Left atrial size was severely dilated.  4. The mitral valve is degenerative. Severe mitral valve regurgitation. No evidence of mitral stenosis.  5. The aortic valve is tricuspid. Aortic valve regurgitation is not visualized. Aortic  valve sclerosis/calcification is present, without any evidence of aortic stenosis. Aortic valve area, by VTI measures 2.43 cm. Aortic valve mean gradient measures 2.0 mmHg. Aortic valve Vmax measures 1.01 m/s.  6. Aortic dilatation noted. There is mild dilatation of the aortic root, measuring 39 mm.  7. The inferior vena cava is normal in size with greater than 50% respiratory variability, suggesting right atrial pressure of 3 mmHg. FINDINGS  Left Ventricle: Left ventricular ejection fraction, by estimation, is <20%. The left ventricle has severely decreased function. The left ventricle demonstrates regional wall motion abnormalities. The left ventricular internal cavity size was severely dilated. There is mild concentric left ventricular hypertrophy. Left ventricular diastolic function could not be evaluated. Right Ventricle: The right ventricular size is severely enlarged. No increase in right ventricular wall thickness. Right ventricular systolic function is severely reduced. Left Atrium: Left atrial size was severely dilated. Right Atrium: Right atrial size was normal in size. Pericardium: There is no evidence of pericardial effusion. Mitral Valve: The mitral valve is degenerative in appearance. There is mild thickening of the mitral valve leaflet(s).  There is mild calcification of the mitral valve leaflet(s). Severe mitral valve regurgitation, with eccentric posteriorly directed jet.  No evidence of mitral valve stenosis. Tricuspid Valve: The tricuspid valve is normal in structure. Tricuspid valve regurgitation is mild . No evidence of tricuspid stenosis. Aortic Valve: The aortic valve is tricuspid. Aortic valve regurgitation is not visualized. Aortic valve sclerosis/calcification is present, without any evidence of aortic stenosis. Aortic valve mean gradient measures 2.0 mmHg. Aortic valve peak gradient measures 4.1 mmHg. Aortic valve area, by VTI measures 2.43 cm. Pulmonic Valve: The pulmonic valve was  normal in structure. Pulmonic valve regurgitation is trivial. No evidence of pulmonic stenosis. Aorta: Aortic dilatation noted. There is mild dilatation of the aortic root, measuring 39 mm. Venous: The inferior vena cava is normal in size with greater than 50% respiratory variability, suggesting right atrial pressure of 3 mmHg. IAS/Shunts: No atrial level shunt detected by color flow Doppler. Additional Comments: A device lead is visualized.  LEFT VENTRICLE PLAX 2D LVIDd:         6.80 cm   Diastology LVIDs:         6.40 cm   LV e' medial:    6.20 cm/s LV PW:         1.20 cm   LV E/e' medial:  9.5 LV IVS:        1.30 cm   LV e' lateral:   7.72 cm/s LVOT diam:     2.00 cm   LV E/e' lateral: 7.6 LV SV:         43 LV SV Index:   21 LVOT Area:     3.14 cm  RIGHT VENTRICLE RV S prime:     6.96 cm/s LEFT ATRIUM             Index        RIGHT ATRIUM           Index LA Vol (A2C):   89.8 ml 44.93 ml/m  RA Area:     15.10 cm LA Vol (A4C):   94.4 ml 47.23 ml/m  RA Volume:   33.80 ml  16.91 ml/m LA Biplane Vol: 91.7 ml 45.88 ml/m  AORTIC VALVE AV Area (Vmax):    2.25 cm AV Area (Vmean):   2.39 cm AV Area (VTI):     2.43 cm AV Vmax:           101.00 cm/s AV Vmean:          70.000 cm/s AV VTI:            0.176 m AV Peak Grad:      4.1 mmHg AV Mean Grad:      2.0 mmHg LVOT Vmax:         72.20 cm/s LVOT Vmean:        53.300 cm/s LVOT VTI:          0.136 m LVOT/AV VTI ratio: 0.77  AORTA Ao Root diam: 3.90 cm MITRAL VALVE                  TRICUSPID VALVE MV Area (PHT): 4.68 cm       TR Peak grad:   25.6 mmHg MV Decel Time: 162 msec       TR Vmax:        253.00 cm/s MR Peak grad:    68.2 mmHg MR Mean grad:    44.0 mmHg    SHUNTS MR Vmax:  413.00 cm/s  Systemic VTI:  0.14 m MR Vmean:        320.0 cm/s   Systemic Diam: 2.00 cm MR PISA:         7.60 cm MR PISA Eff ROA: 66 mm MR PISA Radius:  1.10 cm MV E velocity: 58.60 cm/s Armanda Magic MD Electronically signed by Armanda Magic MD Signature Date/Time: 11/26/2022/9:11:13  AM    Final    CT Angio Chest PE W and/or Wo Contrast  Result Date: 12/08/2022 CLINICAL DATA:  Hemoptysis.  Shortness of breath EXAM: CT ANGIOGRAPHY CHEST WITH CONTRAST TECHNIQUE: Multidetector CT imaging of the chest was performed using the standard protocol during bolus administration of intravenous contrast. Multiplanar CT image reconstructions and MIPs were obtained to evaluate the vascular anatomy. RADIATION DOSE REDUCTION: This exam was performed according to the departmental dose-optimization program which includes automated exposure control, adjustment of the mA and/or kV according to patient size and/or use of iterative reconstruction technique. CONTRAST:  75mL OMNIPAQUE IOHEXOL 350 MG/ML SOLN COMPARISON:  Plain films of earlier today. High-resolution chest CT 09/01/2022 FINDINGS: Cardiovascular: The quality of this exam for evaluation of pulmonary embolism is good. The bolus is well timed. No evidence of pulmonary embolism. Pacer. Upper normal ascending aortic caliber at 3.9 cm. Aortic atherosclerosis. Tortuous descending thoracic aorta. Moderate to marked cardiomegaly, without pericardial effusion. Left main, lad, right coronary artery calcification. Mediastinum/Nodes: Prominent mediastinal and bilateral hilar nodal tissue. Left suprahilar mildly enlarged node measures 10 mm on 52/5 versus 8 mm on the prior. Left hilar node measures 1.3 cm on 60/5 is relatively similar on the prior. Lungs/Pleura: No pleural fluid. Moderate right hemidiaphragm elevation. Development of diffuse, slightly upper lobe predominant ground-glass opacity and septal thickening. This is superimposed upon chronic interstitial lung disease and centrilobular/paraseptal emphysema. Upper Abdomen: Normal imaged portions of the liver, spleen, stomach. Musculoskeletal: No acute osseous abnormality. Review of the MIP images confirms the above findings. IMPRESSION: 1.  No evidence of pulmonary embolism. 2. Development of diffuse,  slightly upper lobe predominant ground-glass opacity and septal thickening. Lack of significant pleural fluid argues against pulmonary edema. Favor pulmonary hemorrhage or atypical infection. 3. Cardiomegaly. Coronary artery atherosclerosis. Aortic Atherosclerosis (ICD10-I70.0). Emphysema (ICD10-J43.9). Interstitial lung disease, poorly evaluated secondary to superimposed acute pulmonary findings. 4. Thoracic adenopathy which is most likely reactive. This could be re-evaluated with contrast enhanced chest CT at 3-6 months. Electronically Signed   By: Jeronimo Greaves M.D.   On: 11/28/2022 13:58   DG Chest Port 1 View  Result Date: 12/11/2022 CLINICAL DATA:  Shortness of breath. EXAM: PORTABLE CHEST 1 VIEW COMPARISON:  11/05/2022 FINDINGS: Stable pacer wires. Stable mild cardiac enlargement. Underlying chronic lung changes with superimposed perihilar pattern of pulmonary edema. Suspect bilateral pleural effusions. No pneumothorax. IMPRESSION: Underlying chronic lung changes with superimposed perihilar pattern of pulmonary edema. Electronically Signed   By: Rudie Meyer M.D.   On: 11/26/2022 12:13   CUP PACEART REMOTE DEVICE CHECK  Result Date: 11/15/2022 Scheduled remote reviewed. Normal device function.  There are 12 NSVT, no therapy Next remote 91 days. LA, CVRS   Microbiology No results found for this or any previous visit (from the past 240 hour(s)).  Lab Basic Metabolic Panel: No results for input(s): "NA", "K", "CL", "CO2", "GLUCOSE", "BUN", "CREATININE", "CALCIUM", "MG", "PHOS" in the last 168 hours. Liver Function Tests: No results for input(s): "AST", "ALT", "ALKPHOS", "BILITOT", "PROT", "ALBUMIN" in the last 168 hours. No results for input(s): "LIPASE", "AMYLASE" in the last 168 hours.  No results for input(s): "AMMONIA" in the last 168 hours. CBC: No results for input(s): "WBC", "NEUTROABS", "HGB", "HCT", "MCV", "PLT" in the last 168 hours. Cardiac Enzymes: No results for input(s):  "CKTOTAL", "CKMB", "CKMBINDEX", "TROPONINI" in the last 168 hours. Sepsis Labs: No results for input(s): "PROCALCITON", "WBC", "LATICACIDVEN" in the last 168 hours.  Procedures/Operations  Intubation, central line placement, arterial line placement, bronchoscopy.   Meziah Blasingame 12/09/2022, 4:54 PM

## 2022-12-15 NOTE — Progress Notes (Signed)
[facility] Progress Note   Subjective:   Issues with circuit clotted with CRRT overnight x3, started citrate and doing much better.  UOP minimal.  No major changes, remains on pressors and vent  Objective Vitals:   12/06/2022 0730 11/26/2022 0745 12/04/2022 0800 12/04/2022 0815  BP:   (!) 88/55   Pulse: 65 65 64 65  Resp: (!) 32 (!) 32 (!) 32 (!) 32  Temp:      TempSrc:      SpO2: 98% 99% 96% 95%  Weight:      Height:       Physical Exam Gen: elderly man on vent  ENT: ETT in place, tip of nose appears dusky Neck: RIJ TLC CV: RRR, no rub  Abd: soft Lungs: noted crackles on vent GU: foley with some yellow urine present Extr:  no edema, fingertips dusky Neuro: sedated  Additional Objective Labs: Basic Metabolic Panel: Recent Labs  Lab 11/29/22 0503 11/29/22 1102 11/29/22 1818 12/03/2022 0530 11/28/2022 0634 12/12/2022 0636  NA 129*   < > 130* 132* 131* 135  K 5.8*   < > 4.5 5.6* 5.5* 4.5  CL 93*  --  96* 99 96* 95*  CO2 23  --  21* 21*  --   --   GLUCOSE 217*  --  254* 239* 255* 276*  BUN 131*  --  128* 120* >130* 93*  CREATININE 4.97*  --  4.71* 4.26* 4.20* 2.20*  CALCIUM 8.1*  --  7.2* 6.9*  --   --   PHOS 7.1*  --  4.6 3.8  --   --    < > = values in this interval not displayed.   Liver Function Tests: Recent Labs  Lab 11/27/22 1712 11/28/22 0405 11/29/22 1818 11/20/2022 0530  AST 1,803* 1,321*  --  324*  ALT 1,077* 1,153*  --  792*  ALKPHOS 75 70  --  91  BILITOT 0.9 1.2*  --  1.3*  PROT 5.0* 5.0*  --  4.6*  ALBUMIN 2.6* 2.7* 2.4* 2.3*   No results for input(s): "LIPASE", "AMYLASE" in the last 168 hours. CBC: Recent Labs  Lab 11/26/22 0256 11/26/22 1419 11/27/22 0409 11/27/22 0512 11/28/22 0405 11/28/22 0803 11/29/22 0413 11/29/22 1102 11/15/2022 0530 12/06/2022 0634 11/17/2022 0636  WBC 15.4*  --  10.8*  --  11.2*  --  12.8*  --  12.8*  --   --   HGB 14.8   < > 13.1   < > 11.1*   < > 11.5*   < > 11.0* 11.9* 13.9  HCT 43.1   < > 38.7*   <  > 32.9*   < > 34.8*   < > 33.3* 35.0* 41.0  MCV 101.7*  --  101.8*  --  100.6*  --  103.6*  --  104.1*  --   --   PLT 147*  --  93*  --  37*  --  39*  --  60*  --   --    < > = values in this interval not displayed.   Blood Culture    Component Value Date/Time   SDES BRONCHIAL ALVEOLAR LAVAGE 11/26/2022 1613   SPECREQUEST LINGULA 11/26/2022 1613   CULT  11/26/2022 1613    Normal respiratory flora-no Staph aureus or Pseudomonas seen Performed at [facility], [address], [place], [place] [address]    REPTSTATUS 11/29/2022 FINAL 11/26/2022 1613    Cardiac Enzymes: No results for input(s): "  CKTOTAL", "CKMB", "CKMBINDEX", "TROPONINI" in the last 168 hours. CBG: Recent Labs  Lab 11/29/22 1519 11/29/22 1942 12/12/2022 0026 12/07/2022 0459 11/17/2022 0808  GLUCAP 243* 203* 250* 215* 240*   Iron Studies: No results for input(s): "IRON", "TIBC", "TRANSFERRIN", "FERRITIN" in the last 72 hours. @lablastinr3 @ Studies/Results: DG CHEST PORT 1 VIEW  Result Date: 11/29/2022 CLINICAL DATA:  Respiratory failure with hypoxia. EXAM: PORTABLE CHEST 1 VIEW COMPARISON:  11/27/2022 FINDINGS: Endotracheal tube is 2.8 cm above the carina. Nasogastric tube extends into the abdomen but the tip is beyond the image. Right jugular central line is near the superior cavoatrial junction. Left chest ICD. Interstitial/airspace lung densities are minimally changed. Slightly increased densities in the retrocardiac space. Again noted is a right chest pigtail drain. No significant pneumothorax. IMPRESSION: 1. Bilateral interstitial/airspace densities and slightly increased densities at the left lung base. 2. Right chest pigtail drain without pneumothorax. Electronically Signed   By: Richarda Overlie M.D.   On: 11/29/2022 16:09   Medications:  calcium gluconate 20 g in dextrose 5 % 1,000 mL infusion 70 mL/hr at 11/26/2022 0700   cefTRIAXone (ROCEPHIN)  IV Stopped (11/29/22 1046)   citrate dextrose     epinephrine 6.5  mcg/min (11/19/2022 0700)   feeding supplement (PIVOT 1.5 CAL) 55 mL/hr at 11/27/2022 0700   fentaNYL infusion INTRAVENOUS 50 mcg/hr (12/11/2022 0700)   norepinephrine (LEVOPHED) Adult infusion 18 mcg/min (11/16/2022 0700)   prismasol B22GK 4/0 400 mL/hr at 12/02/2022 0341   prismasol B22GK 4/0 1,500 mL/hr at 11/16/2022 0801   vasopressin Stopped (11/29/22 0528)    arformoterol  15 mcg Nebulization Q12H   atovaquone  1,500 mg Per Tube Daily   budesonide (PULMICORT) nebulizer solution  0.5 mg Nebulization BID   Chlorhexidine Gluconate Cloth  6 each Topical Daily   feeding supplement (PROSource TF20)  60 mL Per Tube BID   insulin aspart  0-9 Units Subcutaneous Q4H   insulin aspart  7 Units Subcutaneous Q4H   insulin glargine-yfgn  15 Units Subcutaneous BID   methylPREDNISolone (SOLU-MEDROL) injection  80 mg Intravenous Daily   Followed by   Melene Muller ON 12/04/2022] methylPREDNISolone (SOLU-MEDROL) injection  60 mg Intravenous Daily   Followed by   Melene Muller ON 12/09/2022] methylPREDNISolone (SOLU-MEDROL) injection  40 mg Intravenous Daily   Followed by   Melene Muller ON 12/14/2022] methylPREDNISolone (SOLU-MEDROL) injection  20 mg Intravenous Daily   multivitamin  1 tablet Per Tube BID   mouth rinse  15 mL Mouth Rinse Q2H   pantoprazole (PROTONIX) IV  40 mg Intravenous Daily   QUEtiapine  25 mg Per Tube BID   revefenacin  175 mcg Nebulization Daily   sodium chloride flush  10 mL Intrapleural Q8H   sodium chloride flush  3 mL Intravenous Q12H     Assessment/Plan:[name] is an 82 y.o. male with severe ILD secondary to UIP, HTN, HL, PPM, CAD, A fib on xarelto, COPD, s/p AICD, HL, HFrEF 30-35% currently admitted for ILD flare and nephrology is consulted for evaluation and management of AKI.   **Hypoxic respiratory failure: chronic ILD attributed to UIP now with acute worsening with DAH.  On course of high dose steroids.  Has had some modest improvements.  Cultures pending.  Volume doesn't seem to be an  issue at this time. Dialysis per below.    **AKI: normal kidney function now with severe oliguric AKI after hypotension and contrast administration.  DAH raises concern for pulmonary renal syndrome but I doubt given the time course ATN/contrast  much more likely.  Send UA.   Prior serologies haven't included ANCA test - will send if UA abnormal.  11/15 d/w family adding dialysis to therapies as part of their time limited trial --> in setting of BUN > 130 and ongoing blood secretions I consider this uremia an indication for RRT.  I was up front with family that he's a very poor long term dialysis candidate but I hope with some time his kidneys may recover.   Cont use 4K fluids, run net even for now.  On RCA now due to filter clotting.    **A fib: anticoag on hold for DAH   **Transaminitis: presumed shock liver, improving.    **HFrEF:  30-35%, volume ok, run net even for now.    Will follow, reach out w concerns.   Estill Bakes MD 11/26/2022, 8:32 AM  [facility] Pager: [phone]

## 2022-12-15 NOTE — Progress Notes (Signed)
Comfort care measures taken and patient was extubated at 1:01 pm today. Patient passed away peacefully while family at bedside. Time of death was 1:18 pm today, 08-Dec-2022. Death was confirmed by two nurses, Rogue Jury, RN and Winfield Cunas, RN and was pronounced by Rogue Jury, RN.

## 2022-12-15 DEATH — deceased

## 2022-12-19 ENCOUNTER — Ambulatory Visit: Payer: Medicare Other | Admitting: Internal Medicine

## 2022-12-20 LAB — CULTURE, FUNGUS WITHOUT SMEAR

## 2022-12-27 LAB — FUNGAL ORGANISM REFLEX

## 2022-12-27 LAB — FUNGUS CULTURE WITH STAIN

## 2022-12-27 LAB — FUNGUS CULTURE RESULT

## 2023-01-13 LAB — ACID FAST CULTURE WITH REFLEXED SENSITIVITIES (MYCOBACTERIA): Acid Fast Culture: NEGATIVE

## 2023-02-14 ENCOUNTER — Ambulatory Visit: Payer: Medicare Other

## 2023-02-18 ENCOUNTER — Encounter: Payer: Medicare Other | Admitting: Internal Medicine

## 2023-05-16 ENCOUNTER — Ambulatory Visit: Payer: Medicare Other
